# Patient Record
Sex: Female | Born: 1980 | Race: Black or African American | Hispanic: No | Smoking: Former smoker
Health system: Southern US, Community
[De-identification: ages and names within clinical notes are randomized; demographics above are authoritative.]

## PROBLEM LIST (undated history)

## (undated) DIAGNOSIS — D259 Leiomyoma of uterus, unspecified: Secondary | ICD-10-CM

## (undated) DIAGNOSIS — E669 Obesity, unspecified: Secondary | ICD-10-CM

## (undated) DIAGNOSIS — N183 Chronic kidney disease, stage 3 unspecified: Secondary | ICD-10-CM

## (undated) DIAGNOSIS — E875 Hyperkalemia: Secondary | ICD-10-CM

## (undated) DIAGNOSIS — K760 Fatty (change of) liver, not elsewhere classified: Secondary | ICD-10-CM

## (undated) DIAGNOSIS — I5032 Chronic diastolic (congestive) heart failure: Secondary | ICD-10-CM

## (undated) DIAGNOSIS — K219 Gastro-esophageal reflux disease without esophagitis: Secondary | ICD-10-CM

## (undated) DIAGNOSIS — I2510 Atherosclerotic heart disease of native coronary artery without angina pectoris: Secondary | ICD-10-CM

## (undated) DIAGNOSIS — E785 Hyperlipidemia, unspecified: Secondary | ICD-10-CM

## (undated) DIAGNOSIS — D509 Iron deficiency anemia, unspecified: Secondary | ICD-10-CM

## (undated) DIAGNOSIS — E1143 Type 2 diabetes mellitus with diabetic autonomic (poly)neuropathy: Secondary | ICD-10-CM

## (undated) DIAGNOSIS — I10 Essential (primary) hypertension: Secondary | ICD-10-CM

## (undated) DIAGNOSIS — Z8669 Personal history of other diseases of the nervous system and sense organs: Secondary | ICD-10-CM

## (undated) DIAGNOSIS — Z720 Tobacco use: Secondary | ICD-10-CM

## (undated) DIAGNOSIS — I255 Ischemic cardiomyopathy: Secondary | ICD-10-CM

## (undated) DIAGNOSIS — N184 Chronic kidney disease, stage 4 (severe): Secondary | ICD-10-CM

## (undated) DIAGNOSIS — K5730 Diverticulosis of large intestine without perforation or abscess without bleeding: Secondary | ICD-10-CM

## (undated) DIAGNOSIS — E559 Vitamin D deficiency, unspecified: Secondary | ICD-10-CM

## (undated) DIAGNOSIS — N920 Excessive and frequent menstruation with regular cycle: Secondary | ICD-10-CM

## (undated) DIAGNOSIS — E11319 Type 2 diabetes mellitus with unspecified diabetic retinopathy without macular edema: Secondary | ICD-10-CM

## (undated) DIAGNOSIS — I509 Heart failure, unspecified: Secondary | ICD-10-CM

## (undated) DIAGNOSIS — R42 Dizziness and giddiness: Secondary | ICD-10-CM

## (undated) DIAGNOSIS — E119 Type 2 diabetes mellitus without complications: Secondary | ICD-10-CM

## (undated) DIAGNOSIS — K3184 Gastroparesis: Secondary | ICD-10-CM

## (undated) DIAGNOSIS — G8929 Other chronic pain: Secondary | ICD-10-CM

## (undated) HISTORY — DX: Type 2 diabetes mellitus without complications: E11.9

## (undated) HISTORY — DX: Leiomyoma of uterus, unspecified: D25.9

## (undated) HISTORY — DX: Chronic diastolic (congestive) heart failure: I50.32

## (undated) HISTORY — DX: Chronic kidney disease, stage 4 (severe): N18.4

## (undated) HISTORY — DX: Diverticulosis of large intestine without perforation or abscess without bleeding: K57.30

## (undated) HISTORY — DX: Heart failure, unspecified: I50.9

## (undated) HISTORY — DX: Obesity, unspecified: E66.9

## (undated) HISTORY — DX: Type 2 diabetes mellitus with diabetic autonomic (poly)neuropathy: K31.84

## (undated) HISTORY — DX: Dizziness and giddiness: R42

## (undated) HISTORY — DX: Vitamin D deficiency, unspecified: E55.9

## (undated) HISTORY — DX: Tobacco use: Z72.0

## (undated) HISTORY — DX: Type 2 diabetes mellitus with diabetic autonomic (poly)neuropathy: E11.43

## (undated) HISTORY — DX: Hyperlipidemia, unspecified: E78.5

## (undated) HISTORY — DX: Chronic kidney disease, stage 3 (moderate): N18.3

## (undated) HISTORY — DX: Atherosclerotic heart disease of native coronary artery without angina pectoris: I25.10

## (undated) HISTORY — DX: Hyperkalemia: E87.5

## (undated) HISTORY — DX: Chronic kidney disease, stage 3 unspecified: N18.30

## (undated) HISTORY — PX: APPENDECTOMY: SHX54

## (undated) HISTORY — DX: Other chronic pain: G89.29

## (undated) HISTORY — PX: MOUTH SURGERY: SHX715

## (undated) HISTORY — DX: Personal history of other diseases of the nervous system and sense organs: Z86.69

## (undated) HISTORY — DX: Ischemic cardiomyopathy: I25.5

---

## 2015-01-26 ENCOUNTER — Emergency Department: Payer: Self-pay | Admitting: Internal Medicine

## 2015-03-10 HISTORY — PX: CARDIAC CATHETERIZATION: SHX172

## 2015-03-30 ENCOUNTER — Encounter: Payer: Self-pay | Admitting: Physician Assistant

## 2015-03-30 ENCOUNTER — Inpatient Hospital Stay
Admit: 2015-03-30 | Disposition: A | Discharge status: Home / Self Care | Payer: Self-pay | Attending: Internal Medicine | Admitting: Internal Medicine

## 2015-03-30 DIAGNOSIS — I214 Non-ST elevation (NSTEMI) myocardial infarction: Secondary | ICD-10-CM

## 2015-03-30 DIAGNOSIS — E669 Obesity, unspecified: Secondary | ICD-10-CM | POA: Insufficient documentation

## 2015-03-30 DIAGNOSIS — I5021 Acute systolic (congestive) heart failure: Secondary | ICD-10-CM

## 2015-03-30 DIAGNOSIS — Z720 Tobacco use: Secondary | ICD-10-CM

## 2015-03-30 DIAGNOSIS — I340 Nonrheumatic mitral (valve) insufficiency: Secondary | ICD-10-CM

## 2015-03-30 DIAGNOSIS — I2510 Atherosclerotic heart disease of native coronary artery without angina pectoris: Secondary | ICD-10-CM

## 2015-03-30 DIAGNOSIS — I255 Ischemic cardiomyopathy: Secondary | ICD-10-CM | POA: Insufficient documentation

## 2015-03-30 LAB — URINALYSIS, COMPLETE
Bilirubin,UR: NEGATIVE
Glucose,UR: >=500 mg/dL (ref 0–75)
Ketone: NEGATIVE
Nitrite: NEGATIVE
Ph: 5 (ref 4.5–8.0)
Protein: 100
Specific Gravity: 1.023 (ref 1.003–1.030)

## 2015-03-30 LAB — CBC
HCT: 33.4 % — ABNORMAL LOW (ref 35.0–47.0)
HGB: 10.8 g/dL — ABNORMAL LOW (ref 12.0–16.0)
MCH: 25.6 pg — ABNORMAL LOW (ref 26.0–34.0)
MCHC: 32.5 g/dL (ref 32.0–36.0)
MCV: 79 fL — ABNORMAL LOW (ref 80–100)
Platelet: 428 10*3/uL (ref 150–440)
RBC: 4.23 10*6/uL (ref 3.80–5.20)
RDW: 14.2 % (ref 11.5–14.5)
WBC: 6.1 10*3/uL (ref 3.6–11.0)

## 2015-03-30 LAB — PROTIME-INR
INR: 1
Prothrombin Time: 13.5 secs

## 2015-03-30 LAB — HEPARIN LEVEL (UNFRACTIONATED): Anti-Xa(Unfractionated): 0.23 IU/mL — ABNORMAL LOW (ref 0.30–0.70)

## 2015-03-30 LAB — BASIC METABOLIC PANEL
Anion Gap: 8 (ref 7–16)
BUN: 12 mg/dL
Calcium, Total: 8.9 mg/dL
Chloride: 102 mmol/L
Co2: 24 mmol/L
Creatinine: 0.74 mg/dL
EGFR (African American): >60
EGFR (Non-African Amer.): >60
Glucose: 314 mg/dL — ABNORMAL HIGH
Potassium: 3.8 mmol/L
Sodium: 134 mmol/L — ABNORMAL LOW

## 2015-03-30 LAB — CK TOTAL AND CKMB (NOT AT ARMC)
CK, Total: 473 U/L — ABNORMAL HIGH
CK-MB: 25.4 ng/mL — ABNORMAL HIGH

## 2015-03-30 LAB — TROPONIN I
Troponin-I: 7.74 ng/mL — ABNORMAL HIGH
Troponin-I: 8.24 ng/mL — ABNORMAL HIGH
Troponin-I: 9.87 ng/mL — ABNORMAL HIGH

## 2015-03-30 LAB — APTT: Activated PTT: 29.9 secs (ref 23.6–35.9)

## 2015-03-30 LAB — RAPID INFLUENZA A&B ANTIGENS

## 2015-03-30 LAB — HCG, QUANTITATIVE, PREGNANCY: Beta Hcg, Quant.: <1 m[IU]/mL

## 2015-03-30 LAB — CK-MB
CK-MB: 31 ng/mL — ABNORMAL HIGH
CK-MB: 27.7 ng/mL — ABNORMAL HIGH

## 2015-03-31 LAB — LIPID PANEL
Cholesterol: 133 mg/dL
HDL Cholesterol: 26 mg/dL — ABNORMAL LOW
Ldl Cholesterol, Calc: 92 mg/dL
Triglycerides: 77 mg/dL
VLDL Cholesterol, Calc: 15 mg/dL

## 2015-03-31 LAB — BASIC METABOLIC PANEL
Anion Gap: 4 — ABNORMAL LOW (ref 7–16)
BUN: 13 mg/dL
Calcium, Total: 7.7 mg/dL — ABNORMAL LOW
Chloride: 105 mmol/L
Co2: 22 mmol/L
Creatinine: 0.87 mg/dL
EGFR (African American): >60
EGFR (Non-African Amer.): >60
Glucose: 373 mg/dL — ABNORMAL HIGH
Potassium: 3.6 mmol/L
Sodium: 131 mmol/L — ABNORMAL LOW

## 2015-03-31 LAB — HEMOGLOBIN A1C: Hemoglobin A1C: 14.5 % — ABNORMAL HIGH

## 2015-03-31 LAB — CK-MB: CK-MB: 31.5 ng/mL — ABNORMAL HIGH

## 2015-03-31 LAB — TSH: Thyroid Stimulating Horm: 1.076 u[IU]/mL

## 2015-04-02 LAB — URINE CULTURE

## 2015-04-03 ENCOUNTER — Encounter: Payer: Self-pay | Admitting: Cardiovascular Disease

## 2015-04-04 LAB — CULTURE, BLOOD (SINGLE)

## 2015-04-09 NOTE — Discharge Summary (Signed)
PATIENT NAME:  [name] [name], [name] [name] MR#:  [id number] DATE OF BIRTH:  30-Nov-1981  DATE OF ADMISSION:  03/30/2015 DATE OF DISCHARGE:  03/31/2015  ADMISSION DIAGNOSES:  1.  Non-ST elevation myocardial infarction.  2.  New onset congestive heart failure.   DISCHARGE DIAGNOSES:  1.  Non-ST elevation myocardial infarction.  2.  Ischemic cardiomyopathy, ejection fraction of 25%.  3.  Tobacco dependence.  4. UTI   CONSULTATIONS: Dr. Fletcher Anon.   PROCEDURES: The patient underwent a cardiac catheterization on 03/30/2015. She had a drug-eluting stent in a 100% lesion in the mid circumflex. She had another drug-eluting stent in a 90% lesion in the first obtuse marginal.   DIAGNOSTIC DATA: Cholesterol 133. LDL 92. TSH 1.076. Sodium 131, potassium 3.6, chloride 105, bicarb 22, BUN 13, creatinine 0.7, glucose 373.  ucx 80,000 GPC PHYSICAL EXAMINATION AT DISCHARGE: VITAL SIGNS: Temperature 99.4, pulse 95, respirations 17, blood pressure 105/71, 98% on room air.  GENERAL: The patient is alert and oriented, not in acute distress.  CARDIOVASCULAR: Tachycardia. No murmurs, gallops, or rubs. PMI was not displaced.  LUNGS: Clear to auscultation. No crackles, rales, rhonchi or wheezing.  ABDOMEN: Bowel sounds are positive. Nontender, nondistended. No hepatosplenomegaly.   HOSPITAL COURSE: This is a 34 year old female who presented with shortness of breath and chest pressure. Was found to have a non-ST-elevation MI. For further details, please refer to the H and P.  1.  Non-ST-elevation MI. The patient's troponins up to 9. She underwent a 2-D echocardiogram which showed an EF of 30% to 35% along with mild concentric LVH and severe hypokinesis of the inferior, inferolateral and lateral walls. She then underwent a cardiac catheterization which subsequently led to 2 stent placements, as mentioned above, in her mid circumflex lesion and ostial. She is now on Brilinta, Coreg, statin medication and aspirin. She will need close  follow-up with cardiology. She was also referred to cardiac rehab.  2.  Cardiomyopathy with acute systolic heart failure, which ischemic in nature. The patient was diuresed with Lasix. She is now saturating 100% on room air.  3.  Fever. This is likely reactive from her acute heart attack or a viral syndrome or UTI. She was placed on broad-spectrum antibiotics. Blood culture negative.  4.  Diet-controlled diabetes. The patient was referred to outpatient diabetes education.  5.  Tobacco dependence. The patient is encouraged highly to stop smoking. She was counseled for 3 minutes regarding this and will be discharged with a nicotine patch.  6. UTI with fevers on presentation she may have UTI AMOXICILLIN is prescribed blood cx are negative  DISCHARGE MEDICATIONS: 1.  Brilinta 90 mg q. 12 hours  2.  Coreg 3.125 b.i.d.  3.  Atorvastatin 40 mg daily.  4.  Aspirin 81 mg daily.  5.  Nicotine 7 mg/24 hours.  6.  Nitroglycerin sublingual for chest pain.  7. Amoxicillin 500 mg PO TID for 10 days  DISCHARGE DIET: Low sodium, ADA diet.   DISCHARGE ACTIVITY: No exertion or heavy lifting for 1 week.  DISCHARGE FOLLOWUP: The patient will follow up with Dr. Fletcher Anon in 1 week.  TIME SPENT: Approximately 35 minutes.  ____________________________ Donell Beers. Benjie Karvonen, MD spm:sb D: 03/31/2015 12:22:16 ET T: 03/31/2015 12:39:56 ET JOB#: [id number]  cc: Jazir Newey P. Benjie Karvonen, MD, <Dictator> Muhammad A. Fletcher Anon, MD Donell Beers Castin Donaghue MD ELECTRONICALLY SIGNED 03/31/2015 14:52

## 2015-04-09 NOTE — H&P (Signed)
PATIENT NAME:  [name] [name], PIKER MR#:  [id number] DATE OF BIRTH:  1981-11-05  DATE OF ADMISSION:  03/30/2015  REFERRING PHYSICIAN: Marjean Donna, M.D.   PRIMARY CARE PHYSICIAN: Nonlocal.   ADMISSION DIAGNOSIS: Myocarditis and sepsis.   HISTORY OF PRESENT ILLNESS: This is a 34 year old African American female who presents to the Emergency Department complaining of dyspnea on exertion as well as lightheadedness. The patient states that when she is in her usual state of health she has no exercise intolerance, but experienced the aforementioned symptoms just by walking to her car today. She states that once she reached her vehicle, she sat down and rested for awhile and felt better. By the time that she went to bed tonight, she felt some pressure and tightness in her chest that wrapped around her ribs that prompted her to come to the Emergency Department. She states that for 2 days she has had sore throat and headache with chills. She also notes that her feet have been swollen throughout the day for the last few days. In the Emergency Department, she was found to have urinalysis concerning for urinary tract infection, but she denies any urinary symptoms. She admits that she has been spotting some this week and that her period has been irregular. She knows that she has an IUD that needs to be removed, but has not undergone this procedure yet. Most concerning, however, is that laboratory evaluation in the Emergency Department revealed significantly elevated troponin, which prompted the Emergency Department to call for admission.   REVIEW OF SYSTEMS: CONSTITUTIONAL: The patient admits to subjective fever and chills as well as some generalized malaise.  EYES: Denies blurred vision or inflammation.  EARS, NOSE AND THROAT: Denies tinnitus but admits to sore throat.  RESPIRATORY: Denies cough or shortness of breath at rest.  CARDIOVASCULAR: Admits to chest pain as described above. She denies palpitations,  orthopnea, or paroxysmal nocturnal dyspnea.  GASTROINTESTINAL: Denies nausea, vomiting, diarrhea, or abdominal pain.  GENITOURINARY: Denies dysuria, increased frequency, or hesitancy of urination.  ENDOCRINE: Denies polyuria or polydipsia.  HEMATOLOGIC AND LYMPHATIC: Denies easy bruising or bleeding.  INTEGUMENTARY: Denies rashes or lesions.  MUSCULOSKELETAL: Denies arthralgias or myalgias.  NEUROLOGIC: Denies numbness in her extremities or dysarthria.  PSYCHIATRIC: Denies depression or suicidal ideation.   PAST MEDICAL HISTORY: Occasional headaches.   PAST SURGICAL HISTORY: IUD placement as well as oral surgery.   SOCIAL HISTORY: The patient has 1 child who is in good health. She has a 4-1/2 pack-year history of smoking. She denies alcohol or drug use.   FAMILY HISTORY: Diabetes is common on her father's side of the family. There are multiple forms of cancer on her mother's side of the family none of which are breast cancer or uterine cancer.   MEDICATIONS: The patient takes no home medications.   ALLERGIES: SHELLFISH.   PERTINENT LABORATORY RESULTS AND RADIOGRAPHIC FINDINGS: Serum glucose 314, BUN 12, creatinine 0.74, serum sodium 134, potassium 3.8, chloride 102, bicarb 24. Troponin 8.24. White blood cell count 6.1, hemoglobin 10.8, hematocrit 33.4, platelet count 428,000, MCV 79. INR 1.   Urinalysis shows 3+ blood, negative nitrites, 3+ leukocyte esterase, red blood cells too numerous to count and white blood cells too numerous to count, rare bacteria, and there is some budding yeast.   Chest x-ray shows pulmonary edema pattern with interstitial edema and small amount of fluid in the fissures. The heart is a normal size. There are no focal pulmonary findings.   PHYSICAL EXAMINATION: VITAL SIGNS: Temperature 99.6, pulse  119, respirations 18, blood pressure 134/86, pulse oximetry 100% on room air.  GENERAL: The patient is alert and oriented x3, in no apparent distress.  HEENT:  Normocephalic, atraumatic. Pupils equal, round, and reactive to light and accommodation. Extraocular movements are intact. Mucous membranes are moist.  NECK: Trachea is midline. No adenopathy. Thyroid is nonpalpable and nontender.  CHEST: Symmetric and atraumatic.  CARDIOVASCULAR: Regular rate and rhythm. Normal S1, S2. No rubs, clicks, or murmurs appreciated.  LUNGS: Clear to auscultation bilaterally. Normal effort and excursion.  ABDOMEN: Positive bowel sounds. Soft, nontender, nondistended. No hepatosplenomegaly.  GENITOURINARY: Deferred.  MUSCULOSKELETAL: The patient moves all 4 extremities equally. There is 5/5 strength in upper and lower extremities bilaterally.  SKIN: Warm and dry. There are no rashes or lesions.  EXTREMITIES: No clubbing or cyanosis, although the patient does have some trace ankle edema.  NEUROLOGIC: Cranial nerves II through XII are grossly intact.  PSYCHIATRIC: Mood is normal. Affect is congruent. The patient has excellent judgment and insight into her medical condition.   ASSESSMENT AND PLAN: This is a 34 year old female admitted for myocarditis and sepsis.  1.  Myocarditis: The patient has had a viral prodrome followed by dyspnea on exertion and chest tightness at rest. She has a dramatically elevated troponin, but no EKG changes at this time. She has no cardiomegaly, but there is some pulmonary edema all of which form a clinical picture of possible myocarditis. I have started a heparin drip and ordered intravenous immunoglobulin. I have also ordered laboratory evaluation for flu and enterovirus. I have placed a cardiology consult and ordered an echocardiogram.  2.  Sepsis: The patient meets criteria via tachycardia and tachypnea. I have obtained blood cultures and urine cultures as well as started vancomycin and Zosyn for broad antibiotic coverage. Notably, the patient's urinalysis seems positive for infection, but the patient is asymptomatic. She admits to spotting  earlier this week and irregular periods, which could certainly produce enough blood and white blood cells to give a similar picture.  3.  Obesity: The patient's BMI is 30.7. I have encouraged a healthy diet and exercise.  4.  Deep vein thrombosis prophylaxis: The patient is on treatment doses of heparin.  5.  Gastrointestinal prophylaxis: None as the patient is not critically ill at this time.   CODE STATUS: The patient is a FULL code.   TIME SPENT ON ADMISSION ORDERS AND CRITICAL PATIENT CARE: Approximately 45 minutes. ____________________________ Norva Riffle. Marcille Blanco, MD msd:sb D: 03/30/2015 06:23:04 ET T: 03/30/2015 07:10:50 ET JOB#: [id number]  cc: Norva Riffle. Marcille Blanco, MD, <Dictator> Norva Riffle Christropher Gintz MD ELECTRONICALLY SIGNED 04/02/2015 3:29

## 2015-04-09 NOTE — Consult Note (Signed)
PATIENT NAME:  [name] [name], [name] [name] MR#:  [id number] DATE OF BIRTH:  [birth year]  DATE OF CONSULTATION:  03/30/2015  REFERRING PHYSICIAN:   CONSULTING PHYSICIAN:  Muhammad A. Fletcher Anon, MD  REQUESTING:   Rosilyn Mings, MD  REASON FOR CONSULTATION:  Elevated troponin.   HISTORY OF PRESENT ILLNESS: This is a 34 year old African American female with no previous cardiac history. She has prolonged history of type 2 diabetes since 2002 which she reports have been bad control, although she has not been following up with a primary care physician, prolonged history of tobacco use, and obesity. The patient presented to the Emergency Room with shortness of breath, and substernal chest tightness and lightheadedness. She started feeling unusual about 2 days ago with a headache, fever, and possible cold. Last night her symptoms worsened significantly with severe shortness of breath and chest tightness. Upon presentation to the Emergency Room, she was noted to have an elevated troponin. EKG showed no acute ST or T wave changes. She was noted to have sinus tachycardia with evidence of pulmonary edema on chest x-ray. She was hypoxic, but improved with diuresis. She is currently feeling better, but is still tachycardic. She was started on IVIg for presumed myocarditis. She is having a fever today, but her white cell count is normal and blood cultures have been negative.   HOME MEDICATIONS:  Include ibuprofen as needed.   ALLERGIES: INCLUDE SHELLFISH.   FAMILY HISTORY:  Is remarkable for diabetes and cancer.   SOCIAL HISTORY: She smokes 1 pack per day. She denies any alcohol or recreational drug use.   REVIEW OF SYSTEMS: A 10 point review of systems was performed. It is negative other than what is mentioned in the HPI.   PHYSICAL EXAMINATION: GENERAL: The patient appears to be older than her stated age, but in no acute distress.  VITAL SIGNS: Temperature is 99.6, pulse is 123, respiratory rate is 22, blood pressure is  126/80, and oxygen saturation is 94% on 3 liters nasal cannula. She was hypoxic on presentation at 78%.  HEENT: Normocephalic, atraumatic.  NECK: No JVD or carotid bruits.  RESPIRATORY: Normal respiratory effort with no use of accessory muscles. Auscultation reveals normal breath sounds.  CARDIOVASCULAR: Normal PMI. Normal S1 and S2 with no gallops or murmurs.  ABDOMEN: Is benign, nontender, and nondistended.  EXTREMITIES: With no clubbing, cyanosis, or edema.  SKIN: Is warm and dry with no rash.  PSYCHIATRIC: She is alert, oriented x3 with normal mood and affect.   LABORATORY AND DIAGNOSTIC DATA: Renal function is normal. Her glucose was elevated at 314. Initial troponin was 8.24 which increased to 9.87 and then decreased back to 7.74. White cell count is normal. She is mildly anemic with a hemoglobin of 10.9. Blood culture is negative. Urinalysis was positive for infection. ECG showed sinus tachycardia with nonspecific ST and T wave changes. Chest x-ray showed pulmonary edema.   IMPRESSION: 1. Non-ST elevation myocardial infarction.  2. Acute systolic heart failure.  3. Type 2 diabetes.  4. Tobacco use.   RECOMMENDATIONS: The patient's presentation is somewhat unusual. However, she has risk factors for coronary artery disease that include type 2 diabetes and tobacco use. Echocardiogram showed an ejection fraction of 30-35% with severe hypokinesis of the inferior, inferolateral, and  lateral walls with moderate mitral regurgitation which appears to be ischemic. She is having symptoms of upper respiratory tract infection and myocarditis is certainly a possibility. However, this is a diagnosis of exclusion in this situation given that the more likely diagnosis  is underlying obstructive coronary artery disease. Due to that, I recommend proceeding with urgent cardiac catheterization and possible coronary intervention. I discussed the procedure in detail as well as risks and benefits. In the meanwhile,  continue anticoagulation with heparin. Continue aspirin, small dose carvedilol, and lisinopril. Further recommendations to follow after cardiac catheterization.    ____________________________ Mertie Clause Fletcher Anon, MD maa:tr D: 03/30/2015 11:31:26 ET T: 03/30/2015 12:20:36 ET JOB#: [id number]  cc: Muhammad A. Fletcher Anon, MD, <Dictator> Wellington Hampshire MD ELECTRONICALLY SIGNED 04/04/2015 18:36

## 2015-04-09 NOTE — Consult Note (Signed)
Brief Consult Note: Diagnosis: NSTEMI.   Patient was seen by consultant.   Consult note dictated.   Recommend to proceed with surgery or procedure.   Comments: Myocarditis is a possiblility but that is a diagnosis of exclusion.  Risk factors include diabetes and smoking. echo showed significant wall motion abnormalities in LCX distribution with moderate MR.  Continue Heparin.  Cardiac cath today.  Electronic Signatures: Kathlyn Sacramento (MD)  (Signed 21-Apr-16 11:21)  Authored: Brief Consult Note   Last Updated: 21-Apr-16 11:21 by Kathlyn Sacramento (MD)

## 2015-04-13 ENCOUNTER — Ambulatory Visit (INDEPENDENT_AMBULATORY_CARE_PROVIDER_SITE_OTHER): Payer: Self-pay | Admitting: Cardiovascular Disease

## 2015-04-13 ENCOUNTER — Encounter: Payer: Self-pay | Admitting: Cardiovascular Disease

## 2015-04-13 VITALS — BP 134/80 | HR 97 | Ht 64.0 in | Wt 179.5 lb

## 2015-04-13 DIAGNOSIS — I5022 Chronic systolic (congestive) heart failure: Secondary | ICD-10-CM

## 2015-04-13 DIAGNOSIS — E118 Type 2 diabetes mellitus with unspecified complications: Secondary | ICD-10-CM

## 2015-04-13 DIAGNOSIS — I2510 Atherosclerotic heart disease of native coronary artery without angina pectoris: Secondary | ICD-10-CM

## 2015-04-13 DIAGNOSIS — E1169 Type 2 diabetes mellitus with other specified complication: Secondary | ICD-10-CM | POA: Insufficient documentation

## 2015-04-13 DIAGNOSIS — R0602 Shortness of breath: Secondary | ICD-10-CM

## 2015-04-13 DIAGNOSIS — R079 Chest pain, unspecified: Secondary | ICD-10-CM

## 2015-04-13 DIAGNOSIS — E785 Hyperlipidemia, unspecified: Secondary | ICD-10-CM

## 2015-04-13 MED ORDER — LISINOPRIL 5 MG PO TABS: 5.0000 mg | ORAL_TABLET | Freq: Every day | ORAL | Status: DC

## 2015-04-13 NOTE — Assessment & Plan Note (Signed)
She appears to be euvolemic without diuretics. I added lisinopril 5 mg once daily. Continue small dose carvedilol.

## 2015-04-13 NOTE — Patient Instructions (Signed)
Start Lisinopril 5 mg once daily.  Continue other medications.   Refer to [facility] primary care as a new patient.   Follow up in 2 months (come fasting so we do labs).   You can resume work on 05/01/2015 without restriction.

## 2015-04-13 NOTE — Assessment & Plan Note (Signed)
I referred her to [facility] primary care to establish.

## 2015-04-13 NOTE — Progress Notes (Signed)
HPI  This is a 34 year old African American female who is here today for a follow-up visit regarding coronary artery disease and chronic systolic heart failure.  She has prolonged history of type 2 diabetes since 2002 , prolonged history of tobacco use, and obesity.  She presented recently to [facility] with shortness of breath, and substernal chest tightness and lightheadedness. She started feeling unusual about 2 days ago with a headache, fever, and possible cold. She was noted to have mildly elevated troponin. EKG showed no acute ST or T wave changes. She was noted to have sinus tachycardia with evidence of pulmonary edema on chest x-ray. She was hypoxic, but improved with diuresis.  Echocardiogram showed an ejection fraction of 30% with severe inferior, inferolateral and lateral wall hypokinesis with moderate mitral regurgitation. I proceeded with cardiac catheterization via the right radial artery which showed occluded mid left circumflex with significant disease affecting the proximal OM1. There was mild disease affecting the LAD and minor irregularities in the RCA. Ejection fraction was 35%. I performed successful angioplasty and drug-eluting stent placement to the mid LAD and proximal OM without complications. She was not discharged on an ACE inhibitor due to her relatively low blood pressure. She has no health insurance but she was referred to the medication management program at [facility] to assist with medications. Overall she has been doing reasonably well and denies any recurrent chest tightness. She complains of being tired and sleepy all the time. She quit smoking since her cardiac event.    Allergies  Allergen Reactions  . Shellfish Allergy      No current outpatient prescriptions on file prior to visit.   No current facility-administered medications on file prior to visit.     Past Medical History  Diagnosis Date  . CAD (coronary artery disease)     a. NSTEMI 03/2015: cath LM nl, pLAD  tubular 30%, mLCx 100% s/p PCI/DES, OM1 90% s/p PCI/DES, RCA minor irregs, EF 35%;   Marland Kitchen Chronic systolic CHF (congestive heart failure)     a. echo 03/2015: EF 30-35%, mild concentric LVH, severe HK of inf, inflat, & lat walls, mod MR, mild TR  . Ischemic cardiomyopathy   . DM2 (diabetes mellitus, type 2)     a. since 2002  . Tobacco abuse   . Obesity   . MI (myocardial infarction)   . Hyperlipidemia      Past Surgical History  Procedure Laterality Date  . Mouth surgery       Family History  Problem Relation Age of Onset  . Family history unknown: Yes     History   Social History  . Marital Status: Unknown    Spouse Name: N/A  . Number of Children: N/A  . Years of Education: N/A   Occupational History  . Not on file.   Social History Main Topics  . Smoking status: Former Research scientist (life sciences)  . Smokeless tobacco: Not on file  . Alcohol Use: No  . Drug Use: No  . Sexual Activity: Not on file   Other Topics Concern  . Not on file   Social History Narrative       PHYSICAL EXAM   BP 134/80 mmHg  Pulse 97  Ht 5\' 4"  (1.626 m)  Wt 179 lb 8 oz (81.421 kg)  BMI 30.80 kg/m2  Constitutional: She is oriented to person, place, and time. She appears well-developed and well-nourished. No distress.  HENT: No nasal discharge.  Head: Normocephalic and atraumatic.  Eyes: Pupils are  equal and round. No discharge.  Neck: Normal range of motion. Neck supple. No JVD present. No thyromegaly present.  Cardiovascular: Normal rate, regular rhythm, normal heart sounds. Exam reveals no gallop and no friction rub. No murmur heard.  Pulmonary/Chest: Effort normal and breath sounds normal. No stridor. No respiratory distress. She has no wheezes. She has no rales. She exhibits no tenderness.  Abdominal: Soft. Bowel sounds are normal. She exhibits no distension. There is no tenderness. There is no rebound and no guarding.  Musculoskeletal: Normal range of motion. She exhibits no edema and no  tenderness.  Neurological: She is alert and oriented to person, place, and time. Coordination normal.  Skin: Skin is warm and dry. No rash noted. She is not diaphoretic. No erythema. No pallor.  Psychiatric: She has a normal mood and affect. Her behavior is normal. Judgment and thought content normal.  Right radial pulse is normal with no hematoma.  [id number]  Rhythm  Diffuse low voltage.   -  Lateral T wave changes suggestive of ischemia.  ABNORMAL    ASSESSMENT AND PLAN

## 2015-04-13 NOTE — Assessment & Plan Note (Signed)
Continue treatment with atorvastatin. Check fasting lipid profile in 2 months.

## 2015-04-13 NOTE — Assessment & Plan Note (Addendum)
She is doing reasonably well with no recurrent angina. I recommend continuing dual antiplatelet therapy for at least one year. I discussed with her the importance of lifestyle changes. She can resume work on May 23 without restrictions. She works at [employer].

## 2015-04-15 ENCOUNTER — Telehealth: Payer: Self-pay | Admitting: Physician Assistant

## 2015-04-15 ENCOUNTER — Other Ambulatory Visit: Payer: Self-pay | Admitting: Physician Assistant

## 2015-04-15 DIAGNOSIS — I5022 Chronic systolic (congestive) heart failure: Secondary | ICD-10-CM

## 2015-04-15 DIAGNOSIS — I255 Ischemic cardiomyopathy: Secondary | ICD-10-CM

## 2015-04-15 MED ORDER — LOSARTAN POTASSIUM 25 MG PO TABS: 25.0000 mg | ORAL_TABLET | Freq: Every day | ORAL | Status: DC

## 2015-04-15 NOTE — Telephone Encounter (Signed)
Patient called because she had an allergic reaction to lisinopril last p.m., with itching, throat swelling and feeling that her tongue was enlarging and getting raw. She was treated at the emergency room and released. She understands clearly not to take the lisinopril again. She wanted to know what she was to take instead.  I sent in a prescription for losartan 25 mg daily to the [facility] nearby where she is staying with her mother in [place]. She will pick this up and start taking, letting us know how she is doing.

## 2015-04-19 ENCOUNTER — Telehealth: Payer: Self-pay | Admitting: *Deleted

## 2015-04-19 NOTE — Telephone Encounter (Signed)
Letter faxed.

## 2015-04-19 NOTE — Telephone Encounter (Signed)
Pt is asking if we can resend the letter for her job, they said they had not received it.  Please fax to: Garth Bigness  [phone]

## 2015-04-25 ENCOUNTER — Telehealth: Payer: Self-pay | Admitting: *Deleted

## 2015-04-25 NOTE — Telephone Encounter (Signed)
Pt c/o medication issue:  1. Name of Medication: A. Brilliant B. Asprin C. Carvedilol D. Losartan  E. Atorvastatin   2. How are you currently taking this medication (dosage and times per day)? Still taking them.   3. Are you having a reaction (difficulty breathing--STAT)? No, just dizziness.  4. What is your medication issue? Not sure which medication but nausea vomiting dizziness, blurred vision.   Dizziness goes and comes, blurred vision only when sun is out. Went online and saw that theses could be from all the medication but just wants to ask what can be done about this please advise.

## 2015-04-25 NOTE — Telephone Encounter (Signed)
Notified pt of Dr. Tyrell Antonio instructions. Pt verbalized understanding and stated she would get BP cuff, monitor pressures twice daily and call us 3-4 days with readings.

## 2015-04-25 NOTE — Telephone Encounter (Signed)
Dizziness as expected with carvedilol and losartan and usually gets better gradually with time. If possible she should record her blood pressure over the next few days and call us with readings.

## 2015-04-25 NOTE — Telephone Encounter (Signed)
S/w pt who expressed for past couple of day she has been dizzy when standing.  Stated lisinopril 04/13/15 but was advised to d/c after allergic reaction (swelling) on 5/7. Losartan added at that time. She states she has been having blurry vision, diarrhea and occasional vomiting since 5/7 and is concerned it is a reaction to one or a combination of the meds.  Does not take BP at home.  Advised pt to purchase BP cuff and monitor BP. Instructed pt how to obtain orthostatic VS and report the reading to the office.   Forwarded to Dr. Fletcher Anon for review. Please advise.

## 2015-04-26 ENCOUNTER — Ambulatory Visit: Payer: Self-pay

## 2015-04-27 ENCOUNTER — Other Ambulatory Visit: Payer: Self-pay

## 2015-04-27 ENCOUNTER — Telehealth: Payer: Self-pay

## 2015-04-27 ENCOUNTER — Emergency Department
Admission: EM | Admit: 2015-04-27 | Discharge: 2015-04-27 | Disposition: A | Discharge status: Home / Self Care | Payer: Medicaid Other | Attending: Emergency Medicine | Admitting: Emergency Medicine

## 2015-04-27 ENCOUNTER — Emergency Department: Payer: Medicaid Other

## 2015-04-27 ENCOUNTER — Encounter: Payer: Self-pay | Admitting: Emergency Medicine

## 2015-04-27 DIAGNOSIS — Z7982 Long term (current) use of aspirin: Secondary | ICD-10-CM | POA: Insufficient documentation

## 2015-04-27 DIAGNOSIS — R42 Dizziness and giddiness: Secondary | ICD-10-CM | POA: Diagnosis present

## 2015-04-27 DIAGNOSIS — Z87891 Personal history of nicotine dependence: Secondary | ICD-10-CM | POA: Diagnosis not present

## 2015-04-27 DIAGNOSIS — Z79899 Other long term (current) drug therapy: Secondary | ICD-10-CM | POA: Insufficient documentation

## 2015-04-27 DIAGNOSIS — R739 Hyperglycemia, unspecified: Secondary | ICD-10-CM

## 2015-04-27 DIAGNOSIS — E1165 Type 2 diabetes mellitus with hyperglycemia: Secondary | ICD-10-CM | POA: Diagnosis not present

## 2015-04-27 LAB — BASIC METABOLIC PANEL
Anion gap: 9 (ref 5–15)
BUN: 14 mg/dL (ref 6–20)
CO2: 22 mmol/L (ref 22–32)
Calcium: 8.9 mg/dL (ref 8.9–10.3)
Chloride: 100 mmol/L — ABNORMAL LOW (ref 101–111)
Creatinine, Ser: 0.92 mg/dL (ref 0.44–1.00)
GFR calc Af Amer: >60 mL/min (ref >60)
GFR calc non Af Amer: >60 mL/min (ref >60)
Glucose, Bld: 531 mg/dL (ref 65–99)
Potassium: 4.1 mmol/L (ref 3.5–5.1)
Sodium: 131 mmol/L — ABNORMAL LOW (ref 135–145)

## 2015-04-27 LAB — GLUCOSE, CAPILLARY
Glucose-Capillary: 436 mg/dL — ABNORMAL HIGH (ref 65–99)
Glucose-Capillary: 320 mg/dL — ABNORMAL HIGH (ref 65–99)

## 2015-04-27 LAB — CBC
HCT: 35.1 % (ref 35.0–47.0)
Hemoglobin: 11.1 g/dL — ABNORMAL LOW (ref 12.0–16.0)
MCH: 24.5 pg — ABNORMAL LOW (ref 26.0–34.0)
MCHC: 31.5 g/dL — ABNORMAL LOW (ref 32.0–36.0)
MCV: 77.6 fL — ABNORMAL LOW (ref 80.0–100.0)
Platelets: 393 10*3/uL (ref 150–440)
RBC: 4.53 MIL/uL (ref 3.80–5.20)
RDW: 15.7 % — ABNORMAL HIGH (ref 11.5–14.5)
WBC: 6.6 10*3/uL (ref 3.6–11.0)

## 2015-04-27 LAB — TROPONIN I: Troponin I: <0.03 ng/mL (ref <0.031)

## 2015-04-27 MED ORDER — METFORMIN HCL 500 MG PO TABS
500.0000 mg | ORAL_TABLET | Freq: Two times a day (BID) | ORAL | Status: DC
Start: 2015-04-27 — End: 2015-05-16

## 2015-04-27 MED ORDER — INSULIN ASPART 100 UNIT/ML ~~LOC~~ SOLN
SUBCUTANEOUS | Status: AC
Start: 2015-04-27 — End: 2015-04-27
  Administered 2015-04-27: 5 [IU] via INTRAVENOUS
  Filled 2015-04-27: qty 5

## 2015-04-27 MED ORDER — INSULIN ASPART 100 UNIT/ML ~~LOC~~ SOLN
5.0000 [IU] | Freq: Once | SUBCUTANEOUS | Status: AC
  Administered 2015-04-27: 5 [IU] via INTRAVENOUS

## 2015-04-27 MED ORDER — METFORMIN HCL 500 MG PO TABS
500.0000 mg | ORAL_TABLET | Freq: Once | ORAL | Status: AC
  Administered 2015-04-27: 500 mg via ORAL

## 2015-04-27 MED ORDER — SODIUM CHLORIDE 0.9 % IV BOLUS (SEPSIS)
1000.0000 mL | Freq: Once | INTRAVENOUS | Status: AC
  Administered 2015-04-27: 1000 mL via INTRAVENOUS

## 2015-04-27 MED ORDER — METFORMIN HCL 500 MG PO TABS
ORAL_TABLET | ORAL | Status: AC
  Administered 2015-04-27: 500 mg via ORAL
  Filled 2015-04-27: qty 1

## 2015-04-27 MED ORDER — INSULIN ASPART 100 UNIT/ML ~~LOC~~ SOLN
SUBCUTANEOUS | Status: AC
  Administered 2015-04-27: 5 [IU] via INTRAVENOUS
  Filled 2015-04-27: qty 5

## 2015-04-27 MED ORDER — SODIUM CHLORIDE 0.9 % IV BOLUS (SEPSIS): 2000.0000 mL | Freq: Once | INTRAVENOUS | Status: DC

## 2015-04-27 NOTE — Telephone Encounter (Signed)
These reading look ok.

## 2015-04-27 NOTE — Telephone Encounter (Signed)
Pt returned call and indicated she was at [facility] ED with sick daughter.. Pt feels light headed now and is having ER staff treat her as well.  Notified her of Dr. Tyrell Antonio response to BP readings. Indicated she would contact us with ER findings.

## 2015-04-27 NOTE — ED Notes (Signed)
Patient states she has been lightheaded and getting worse over last week. Was here on 03/30/15 for a heart attack. Has been given medication that she thinks may have added to it.

## 2015-04-27 NOTE — Telephone Encounter (Signed)
Pt has BP readings: 5/17-113/83 5/18-7:26 am 100/77; 7:05 pm 130/94 5/19- 7:28 am 98/73; 10:49 am 99/68 pt states she was feeling lightheaded and dizzy at this reading

## 2015-04-27 NOTE — Telephone Encounter (Signed)
Forward to Dr. Fletcher Anon for review of BP readings.  Currently taking coreg 3.125mg   BID Losartan 25mg  daily

## 2015-04-27 NOTE — ED Notes (Signed)
Pt reports that she had a MI in April of this year. She is on B/P medications and feels that they maybe making her dizzy. She has been taking her B/P and it has been in the 90's. Her MD told her to call them if it dropped, they have not called her back. Pt states that she has started to get Short of breath now also.

## 2015-04-27 NOTE — ED Provider Notes (Signed)
[facility] Emergency Department Provider Note  ____________________________________________  Time seen: Approximately [id number] PM  I have reviewed the triage vital signs and the nursing notes.   HISTORY  Chief Complaint Dizziness    HPI Joselynne Agins is a 34 y.o. female with a history of recent MI and diabetes which is diet controlled and presents with intermittent dizziness over the past week. She says that she is getting dizzy every time she stands up. She denies any chest pain however became short of breath while waiting in line with associated lightheadedness. Denied any palpitations or chest pain at this time. Patient reports compliance with her medicines that she was prescribed upon discharge. No symptoms while seated.   Past Medical History  Diagnosis Date  . CAD (coronary artery disease)     a. NSTEMI 03/2015: cath LM nl, pLAD tubular 30%, mLCx 100% s/p PCI/DES, OM1 90% s/p PCI/DES, RCA minor irregs, EF 35%;   Marland Kitchen Chronic systolic CHF (congestive heart failure)     a. echo 03/2015: EF 30-35%, mild concentric LVH, severe HK of inf, inflat, & lat walls, mod MR, mild TR  . Ischemic cardiomyopathy   . DM2 (diabetes mellitus, type 2)     a. since 2002  . Tobacco abuse   . Obesity   . MI (myocardial infarction)   . Hyperlipidemia     Patient Active Problem List   Diagnosis Date Noted  . Hyperlipidemia 04/13/2015  . CAD (coronary artery disease)   . Chronic systolic CHF (congestive heart failure)   . Ischemic cardiomyopathy   . DM2 (diabetes mellitus, type 2)   . Tobacco abuse   . Obesity     Past Surgical History  Procedure Laterality Date  . Mouth surgery    . Cardiac catheterization  4/16    x2 stent [facility]    Current Outpatient Rx  Name  Route  Sig  Dispense  Refill  . aspirin 81 MG tablet   Oral   Take 81 mg by mouth daily.         Marland Kitchen atorvastatin (LIPITOR) 40 MG tablet   Oral   Take 40 mg by mouth daily.         . carvedilol  (COREG) 3.125 MG tablet   Oral   Take 3.125 mg by mouth 2 (two) times daily with a meal.         . losartan (COZAAR) 25 MG tablet   Oral   Take 1 tablet (25 mg total) by mouth daily.   30 tablet   11   . nicotine (NICODERM CQ - DOSED IN MG/24 HR) 7 mg/24hr patch   Transdermal   Place 7 mg onto the skin daily.         . nitroGLYCERIN (NITRODUR - DOSED IN MG/24 HR) 0.4 mg/hr patch   Transdermal   Place 0.4 mg onto the skin daily.         . ticagrelor (BRILINTA) 90 MG TABS tablet   Oral   Take 90 mg by mouth 2 (two) times daily.           Allergies Lisinopril and Shellfish allergy  Family History  Problem Relation Age of Onset  . Family history unknown: Yes    Social History History  Substance Use Topics  . Smoking status: Former Research scientist (life sciences)  . Smokeless tobacco: Not on file  . Alcohol Use: No    Review of Systems Constitutional: No fever/chills Eyes: No visual changes. ENT: No sore throat.  Cardiovascular: Denies chest pain. Respiratory: Denies shortness of breath at this time. Gastrointestinal: No abdominal pain.  No nausea, no vomiting.  No diarrhea.  No constipation. Genitourinary: Negative for dysuria. Musculoskeletal: Negative for back pain. Skin: Negative for rash. Neurological: Negative for headaches, focal weakness or numbness.  10-point ROS otherwise negative.  ____________________________________________   PHYSICAL EXAM:  VITAL SIGNS: ED Triage Vitals  Enc Vitals Group     BP 04/27/15 1419 105/76 mmHg     Pulse Rate 04/27/15 1419 99     Resp 04/27/15 1419 18     Temp 04/27/15 1419 97.8 F (36.6 C)     Temp Source 04/27/15 1419 Oral     SpO2 04/27/15 1419 100 %     Weight 04/27/15 1419 169 lb (76.658 kg)     Height 04/27/15 1419 5\' 4"  (1.626 m)     Head Cir --      Peak Flow --      Pain Score 04/27/15 1419 0     Pain Loc --      Pain Edu? --      Excl. in [place]? --     Constitutional: Alert and oriented. Well appearing and in no  acute distress. Eyes: Conjunctivae are normal. PERRL. EOMI. Head: Atraumatic. Nose: No congestion/rhinnorhea. Mouth/Throat: Mucous membranes are moist.  Oropharynx non-erythematous. Neck: No stridor.   Cardiovascular: Normal rate, regular rhythm. Grossly normal heart sounds.  Good peripheral circulation. Respiratory: Normal respiratory effort.  No retractions. Lungs CTAB. Gastrointestinal: Soft and nontender. No distention. No abdominal bruits. No CVA tenderness. Musculoskeletal: No lower extremity tenderness nor edema.  No joint effusions. Neurologic:  Normal speech and language. No gross focal neurologic deficits are appreciated. Speech is normal. No gait instability. Skin:  Skin is warm, dry and intact. No rash noted. Psychiatric: Mood and affect are normal. Speech and behavior are normal.  ____________________________________________   LABS (all labs ordered are listed, but only abnormal results are displayed)  Labs Reviewed  CBC - Abnormal; Notable for the following:    Hemoglobin 11.1 (*)    MCV 77.6 (*)    MCH 24.5 (*)    MCHC 31.5 (*)    RDW 15.7 (*)    All other components within normal limits  BASIC METABOLIC PANEL - Abnormal; Notable for the following:    Sodium 131 (*)    Chloride 100 (*)    Glucose, Bld 531 (*)    All other components within normal limits  GLUCOSE, CAPILLARY - Abnormal; Notable for the following:    Glucose-Capillary 436 (*)    All other components within normal limits  TROPONIN I   ____________________________________________  EKG  ED ECG REPORT I, Doran Stabler, the attending physician, personally viewed and interpreted this ECG.   Date: 04/27/2015  EKG Time: 1437  Rate: 97  Rhythm: normal EKG, normal sinus rhythm, unchanged from previous tracings, normal sinus rhythm  Axis: Normal axis  Intervals:none  ST&T Change: T wave inversions in 23 aVF and aVL and V6. Old EKG reviewed in T-wave inversions only in aVL and 2 with flattening  in V6. Without chest pain or elevated troponin these changes likely represent changes post MI. Do not suspect acute process.   ____________________________________________  RADIOLOGY  Chest x-ray and NAD ____________________________________________   PROCEDU  ____________________________________________   INITIAL IMPRESSION / ASSESSMENT AND PLAN / ED COURSE  Pertinent labs & imaging results that were available during my care of the patient were reviewed by me and  considered in my medical decision making (see chart for details).  Patient with likely dehydration with elevated glucose. We'll rehydrate and give insulin. Consulted with Dr. Genia Harold to the patient's discharge instructions. Recommends metformin 500 mg twice a day and follow-up with diabetes education.  ----------------------------------------- 8:23 PM on 04/27/2015 -----------------------------------------  Patient resting calmly. Glucose down to 320. We'll discharge home with metformin twice a day. Consult ordered 2 diabetes nurse [place]. We'll give patient follow up with the [facility]. Patient says that symptoms are resolved after hydration and now that glucose is lowered. ____________________________________________   FINAL CLINICAL IMPRESSION(S) / ED DIAGNOSES  Hyperglycemia, diabetic. Acute, return visit.    Orbie Pyo, MD 04/27/15 2024

## 2015-04-27 NOTE — Telephone Encounter (Signed)
Left message on machine for patient to contact the office.   

## 2015-04-27 NOTE — Discharge Instructions (Signed)

## 2015-04-27 NOTE — ED Notes (Signed)
CRITICAL VALUE ALERT  Critical value received:  Blood Glucose 533  Date of notification:  04/27/15  Time of notification:  1508  Critical value read back:Yes.    Nurse who received alert:  Ernst Spell   MD notified (1st page):  Dr. Archie Balboa   Time of first page:  N/a  MD notified (2nd page): N/a  Time of second page: N/a  Responding MD:  Dr. Archie Balboa   Time MD responded:  [phone]

## 2015-04-28 NOTE — Telephone Encounter (Signed)
S/w pt who went to ER yesterday with sick daughter. Pt began to feel light headed and was treated at ER as well. In ER: BP stable BS in 500's. Treated and prescription given for metformin. Pt. Indicates she has not yet had it filled.  Encouraged pt to start taking metformin today and to continue monitoring BP.  Has follow up with Princella Ion in one week  Wants work note that states she is unable to return to work Monday. Will forward to Dr. Fletcher Anon for review

## 2015-04-28 NOTE — Telephone Encounter (Signed)
S/w pt who indicated she was feeling better but had not filled Metformin prescription. Educated pt on importance of medication compliance and to follow up with PCP to monitor DM. Pt had no further questions.

## 2015-04-28 NOTE — Telephone Encounter (Signed)
If her blood sugar goes down she should be able to go back to work on Monday. She should start taking metformin. This is really a blood sugar issue and she should address that with her primary care physician.

## 2015-04-28 NOTE — Telephone Encounter (Signed)
Pt updating [place], she is still light headed and now is concerned about having to go back work Monday.  Pt is asking for something so she is able to give to her job that she can't return to work.   Below is pt fax number: Fax: [phone]

## 2015-05-02 ENCOUNTER — Telehealth: Payer: Self-pay | Admitting: Cardiology

## 2015-05-02 NOTE — Telephone Encounter (Signed)
  Patient is s/p recent PCI and new diagnosis of systolic HF with EF of [id number]. Discharged on ASA, Brilinta, Coreg and lisinopril. Not on any diuretics. She called complaining of increased LEE that started today. No dyspnea. No chest pain. She had mild diaphoresis earlier. BP is stable at 125/88. HR is elevated at 111 (due for evening dose of Coreg). She reports full medication compliance and adherence to low sodium diet. I recommended that she call the office tomorrow for work-in at [place] office if available, if not may need to be seen in [place] in [facility]. Patient may have early stage of acute CHF. She was instructed to report to ER if any severe respiratory distress overnight. Patient voiced understanding and will call for an appointment in the am.   [name] [name] 05/02/2015 8:05 PM

## 2015-05-03 ENCOUNTER — Ambulatory Visit (INDEPENDENT_AMBULATORY_CARE_PROVIDER_SITE_OTHER): Payer: Self-pay | Admitting: Physician Assistant

## 2015-05-03 ENCOUNTER — Telehealth: Payer: Self-pay

## 2015-05-03 VITALS — BP 150/96 | HR 96 | Resp 18

## 2015-05-03 DIAGNOSIS — R0602 Shortness of breath: Secondary | ICD-10-CM

## 2015-05-03 DIAGNOSIS — I5022 Chronic systolic (congestive) heart failure: Secondary | ICD-10-CM

## 2015-05-03 DIAGNOSIS — R509 Fever, unspecified: Secondary | ICD-10-CM

## 2015-05-03 DIAGNOSIS — E118 Type 2 diabetes mellitus with unspecified complications: Secondary | ICD-10-CM

## 2015-05-03 DIAGNOSIS — I255 Ischemic cardiomyopathy: Secondary | ICD-10-CM

## 2015-05-03 DIAGNOSIS — E785 Hyperlipidemia, unspecified: Secondary | ICD-10-CM

## 2015-05-03 DIAGNOSIS — I2510 Atherosclerotic heart disease of native coronary artery without angina pectoris: Secondary | ICD-10-CM

## 2015-05-03 NOTE — Telephone Encounter (Signed)
Pt states she had to leave work early yesterday, states her HR was 111, this a.m. Was 102, states she was SOB, Swelling in her hands and feet. States she currently does not have any SOB, also lower back pain, and discomfort in arms.

## 2015-05-03 NOTE — Telephone Encounter (Signed)
S/w pt who indicated she could come in to office today at 3:00pm for a nurse visit

## 2015-05-03 NOTE — Progress Notes (Signed)
1.) Reason for visit: SOB, light headed  2.) Name of MD requesting visit:  Christell Faith, PA  3.) H&P: Pt s/p PCI and newly diagnosed CHF. Presented to ER 5/19 with elevated BS. Called office today after speaking with Lyda Jester PA-C on  5/24 who advised her to call  for a work-in.  Pt  c/o not feeling well, light-headed, LE tingling and SOB. Indicated she was sent home from work last night due to not feeling well.   4.) ROS related to problem: Patient presented for EKG and complained of SOB and light-headed. Indicated she had some LE swelling last night that has resolved.   5.) Assessment and plan per MD:  Continue same medications. Compliance with all meds including metformin, healthy lifestyle. Establish PCP.  Appointment made for pt with Dr. Brunetta Genera 5/27 at 2:15pm

## 2015-05-03 NOTE — Patient Instructions (Addendum)
Medication Instructions:  Please continue current meds  Labwork: None  Testing/Procedures: None  Follow-Up: Please follow up w/ Dr. Fletcher Anon in 3 months (pt has appt w/Arida July 7)  You have an appt to see Dr. Guy Begin at [facility],  [place], [place] Friday, May 27 at 2:15pm  $100 visit

## 2015-05-03 NOTE — Progress Notes (Signed)
Cardiology Office Note:  Date of Encounter: 05/03/2015  ID: [name], DOB 11-09-1981, MRN [id number]  PCP:  Kathlyn Sacramento, MD Primary Cardiologist:  Dr. Fletcher Anon, MD  Chief Complaint  Patient presents with  . other    EKG    HPI:  34 year old female who is here today for a follow up visit regarding coronary artery disease and chronic systolic heart failure. She has prolonged history of type 2 diabetes since 2002, prolonged history of tobacco use, and obesity.  She presented to [facility] in 03/2015 with SOB, substernal chest tightness, and lightheadedness. She was noted to have an elevated troponin to 9. EKG showed no acute ST or T wave changes. She was noted to have sinus tachycardia with evidence of pulmonary edema on chest x-ray. She was hypoxic, but improved with diuresis.  Echocardiogram showed an ejection fraction of 30% with severe inferior, inferolateral and lateral wall hypokinesis with moderate mitral regurgitation. She underwent cardiac catheterization via the right radial artery which showed occluded mid left circumflex with significant disease affecting the proximal OM1. There was mild disease affecting the LAD and minor irregularities in the RCA. Ejection fraction was 35%. She underwent successful angioplasty and drug-eluting stent placement to the mid LCx and proximal OM without complications. She was discharged on Coreg 3.125 mg bid, Brilinta 90 mg bid, Lipitor 40 mg daily, aspirin 81 mg daily, SL NTG, and nicotine patches. She has no health insurance but she was referred to the medication management program at [facility] to assist with medications.   In hospital follow up on 5/5, overall she had been doing reasonably well and denied any recurrent chest tightness. She complains of being tired and sleepy all the time. She quit smoking since her cardiac event. She was started on lisinopril 5 mg daily and advised she could resume work on 5/23. She was euvolemic at that time. After  starting the lisinopril she called on 5/7 reporting angioedema reaction and this was stopped. Losartan was called in, which she has been tolerating without issue. She called 5/17 with dizziness and was advised to pick up a BP cuff. She called back on 5/19 stating her BP was stable, though her blood sugar was in the 500's. She presented to [facility] ED for evaluation both herself and her daughter as she has had multiple family members with URI, cough and fever. While patient was at the ER for her daughter she felt dizzy so she decided to get checked in and was noted to have a blood sugar of greater than 500. Work up was negative. She was treated for her hyperglycemia and uncontrolled DM. She was made an appointment at the [facility] in 1 week.   Patient ultimately returned to work on 5/24. She had myalgias, fevers, chills, and dizziness and was sent home early. Subjective lower extremity and upper extremity edema. No cough. Positive globus sensation. She did not go to work today and requests an out of work note.      Past Medical History  Diagnosis Date  . CAD (coronary artery disease)     a. NSTEMI 03/2015: cath LM nl, pLAD tubular 30%, mLCx 100% s/p PCI/DES, OM1 90% s/p PCI/DES, RCA minor irregs, EF 35%;   Marland Kitchen Chronic systolic CHF (congestive heart failure)     a. echo 03/2015: EF 30-35%, mild concentric LVH, severe HK of inf, inflat, & lat walls, mod MR, mild TR  . Ischemic cardiomyopathy   . DM2 (diabetes mellitus, type 2)  a. since 2002  . Tobacco abuse   . Obesity   . MI (myocardial infarction)   . Hyperlipidemia   :  Past Surgical History  Procedure Laterality Date  . Mouth surgery    . Cardiac catheterization  4/16    x2 stent [facility]  :  Social History:  The patient  reports that she has quit smoking. She does not have any smokeless tobacco history on file. She reports that she does not drink alcohol or use illicit drugs.   Family History  Problem Relation Age of Onset  .  Family history unknown: Yes     Allergies:  Allergies  Allergen Reactions  . Lisinopril Swelling  . Shellfish Allergy      Home Medications:  Current Outpatient Prescriptions  Medication Sig Dispense Refill  . aspirin 81 MG tablet Take 81 mg by mouth daily.    Marland Kitchen atorvastatin (LIPITOR) 40 MG tablet Take 40 mg by mouth daily.    . carvedilol (COREG) 3.125 MG tablet Take 3.125 mg by mouth 2 (two) times daily with a meal.    . losartan (COZAAR) 25 MG tablet Take 1 tablet (25 mg total) by mouth daily. 30 tablet 11  . metFORMIN (GLUCOPHAGE) 500 MG tablet Take 1 tablet (500 mg total) by mouth 2 (two) times daily with a meal. 60 tablet 0  . nicotine (NICODERM CQ - DOSED IN MG/24 HR) 7 mg/24hr patch Place 7 mg onto the skin daily.    . nitroGLYCERIN (NITRODUR - DOSED IN MG/24 HR) 0.4 mg/hr patch Place 0.4 mg onto the skin daily.    . ticagrelor (BRILINTA) 90 MG TABS tablet Take 90 mg by mouth 2 (two) times daily.     No current facility-administered medications for this visit.     Review of Systems:  Review of Systems  Constitutional: Positive for fever, chills and malaise/fatigue. Negative for weight loss and diaphoresis.  HENT: Positive for congestion and sore throat. Negative for ear discharge, ear pain, hearing loss, nosebleeds and tinnitus.   Eyes: Negative for pain, discharge and redness.  Respiratory: Negative for cough, hemoptysis, sputum production, shortness of breath, wheezing and stridor.   Cardiovascular: Negative for chest pain, palpitations, orthopnea, claudication, leg swelling and PND.  Gastrointestinal: Negative for heartburn, nausea and vomiting.  Musculoskeletal: Positive for myalgias, back pain, joint pain and neck pain. Negative for falls.  Skin: Negative for itching and rash.  Neurological: Positive for dizziness, weakness and headaches. Negative for tingling, tremors and focal weakness.  Psychiatric/Behavioral: The patient is nervous/anxious.   All other  systems reviewed and are negative.    Physical Exam:  Blood pressure 150/96, pulse 96, resp. rate 18, last menstrual period 04/27/2015, SpO2 98 %. There is no weight on file to calculate BMI. General: Pleasant, NAD, laying comfortably on the exam table fully supine without any labored breathing. Psych: Normal affect. Neuro: Alert and oriented X 3. Moves all extremities spontaneously. HEENT: Normal.   Neck: Supple without bruits or JVD. Lungs:  Resp regular and unlabored, CTA bilaterally without wheezing, rales, or rhonchi.  Heart: RRR no s3, s4, or murmurs. Abdomen: Soft, non-tender, non-distended, BS + x 4.  Extremities: No clubbing or cyanosis.  There is no appreciable edema along any extremity. DP/PT/Radials 2+ and equal bilaterally.   Accessory Clinical Findings:  EKG - NSR, 94, nonspecific lateral st/t changes  Other studies Reviewed: Additional studies/ records that were reviewed today include: prior note.   Recent Labs: 04/27/2015: BUN 14; Creatinine 0.92; Hemoglobin  11.1*; Platelets 393; Potassium 4.1; Sodium 131*    Lipid Panel No results found for: CHOL, TRIG, HDL, CHOLHDL, VLDL, LDLCALC, LDLDIRECT   Weights: Wt Readings from Last 3 Encounters:  04/27/15 169 lb (76.658 kg)  04/13/15 179 lb 8 oz (81.421 kg)     Assessment & Plan:  1. CAD s/p PCI to mid LCx and prox OM1: -She is doing well from a cardiac standpoint, without angina or SOB -EKG without acute ischemic changes -Continue DAPT for at least 12 months, aspirin and Brilinta  -Continue Coreg 3.125 mg bid (I did not increase her dose today as her BP is usually on the soft side and it is likely transiently elevated 2/2 her fever) -Continue losartan and Lipitor    2. Dizziness:  -Most likely related to her poorly controlled diabetes  -She reports having had a blood sugar of 400 on 5/24 and 200 on the morning of 5/25 -She reports not having a PCP to date, though it is documented an appointment was made  for her at the [facility] on 5/19 -Our office called and made an appointment for her at a new PCP office until her Medicaid is approved  -She has also been referred to [facility] care by Dr. Fletcher Anon  3. DM2: -Uncontrolled, likely exacerbated by URI -Continue metformin per prescribing MD -PCP appointment was made prior to patient leaving office   4. Chronic systolic CHF/ICM: -She appears euvolemic on exam today -Lungs are clear, there is no edema on exam -Continue medications per #1 -Limit salt and fluid intake  5. Work: -She has been cleared to go back to work on 05/01/2015 and that remains the same today on 05/03/2015 -She asks several times about not going to work -There is no cardiac indication for her to not go to work  -Cleared to go to work at full duty  Dispo: -At some point she will have to take ownership in her own health care and make an appointment with a PCP to get her diabetes under control  -Follow up with Dr. Fletcher Anon in 3 months   Christell Faith, PA-C [name], [place] [address] [phone] [place] 05/03/2015, 4:12 PM

## 2015-05-05 ENCOUNTER — Emergency Department (HOSPITAL_COMMUNITY): Payer: Medicaid Other

## 2015-05-05 ENCOUNTER — Observation Stay (HOSPITAL_COMMUNITY)
Admission: EM | Admit: 2015-05-05 | Discharge: 2015-05-05 | Disposition: A | Discharge status: Home / Self Care | Payer: Medicaid Other | Attending: Internal Medicine | Admitting: Internal Medicine

## 2015-05-05 ENCOUNTER — Encounter (HOSPITAL_COMMUNITY)
Admission: EM | Disposition: A | Discharge status: Home / Self Care | Payer: Self-pay | Source: Home / Self Care | Attending: Emergency Medicine

## 2015-05-05 ENCOUNTER — Encounter (HOSPITAL_COMMUNITY): Payer: Self-pay | Admitting: Emergency Medicine

## 2015-05-05 DIAGNOSIS — Z955 Presence of coronary angioplasty implant and graft: Secondary | ICD-10-CM | POA: Diagnosis not present

## 2015-05-05 DIAGNOSIS — Z87891 Personal history of nicotine dependence: Secondary | ICD-10-CM | POA: Insufficient documentation

## 2015-05-05 DIAGNOSIS — E785 Hyperlipidemia, unspecified: Secondary | ICD-10-CM | POA: Diagnosis not present

## 2015-05-05 DIAGNOSIS — I255 Ischemic cardiomyopathy: Secondary | ICD-10-CM | POA: Insufficient documentation

## 2015-05-05 DIAGNOSIS — D649 Anemia, unspecified: Secondary | ICD-10-CM | POA: Insufficient documentation

## 2015-05-05 DIAGNOSIS — Z794 Long term (current) use of insulin: Secondary | ICD-10-CM | POA: Diagnosis not present

## 2015-05-05 DIAGNOSIS — R072 Precordial pain: Principal | ICD-10-CM

## 2015-05-05 DIAGNOSIS — E119 Type 2 diabetes mellitus without complications: Secondary | ICD-10-CM | POA: Insufficient documentation

## 2015-05-05 DIAGNOSIS — I252 Old myocardial infarction: Secondary | ICD-10-CM | POA: Diagnosis not present

## 2015-05-05 DIAGNOSIS — R079 Chest pain, unspecified: Secondary | ICD-10-CM

## 2015-05-05 DIAGNOSIS — E118 Type 2 diabetes mellitus with unspecified complications: Secondary | ICD-10-CM

## 2015-05-05 DIAGNOSIS — I5022 Chronic systolic (congestive) heart failure: Secondary | ICD-10-CM

## 2015-05-05 DIAGNOSIS — Z7982 Long term (current) use of aspirin: Secondary | ICD-10-CM | POA: Insufficient documentation

## 2015-05-05 DIAGNOSIS — I2510 Atherosclerotic heart disease of native coronary artery without angina pectoris: Secondary | ICD-10-CM

## 2015-05-05 DIAGNOSIS — Z23 Encounter for immunization: Secondary | ICD-10-CM | POA: Diagnosis not present

## 2015-05-05 HISTORY — PX: CARDIAC CATHETERIZATION: SHX172

## 2015-05-05 LAB — CBC WITH DIFFERENTIAL/PLATELET
Basophils Absolute: 0.1 10*3/uL (ref 0.0–0.1)
Basophils Absolute: 0.1 10*3/uL (ref 0.0–0.1)
Basophils Relative: 1 % (ref 0–1)
Basophils Relative: 1 % (ref 0–1)
Eosinophils Absolute: 0.3 10*3/uL (ref 0.0–0.7)
Eosinophils Absolute: 0.4 10*3/uL (ref 0.0–0.7)
Eosinophils Relative: 4 % (ref 0–5)
Eosinophils Relative: 4 % (ref 0–5)
HCT: 31.2 % — ABNORMAL LOW (ref 36.0–46.0)
HCT: 31.6 % — ABNORMAL LOW (ref 36.0–46.0)
Hemoglobin: 10.3 g/dL — ABNORMAL LOW (ref 12.0–15.0)
Hemoglobin: 10.5 g/dL — ABNORMAL LOW (ref 12.0–15.0)
Lymphocytes Relative: 39 % (ref 12–46)
Lymphocytes Relative: 31 % (ref 12–46)
Lymphs Abs: 2.8 10*3/uL (ref 0.7–4.0)
Lymphs Abs: 2.6 10*3/uL (ref 0.7–4.0)
MCH: 24.2 pg — ABNORMAL LOW (ref 26.0–34.0)
MCH: 24.5 pg — ABNORMAL LOW (ref 26.0–34.0)
MCHC: 33 g/dL (ref 30.0–36.0)
MCHC: 33.2 g/dL (ref 30.0–36.0)
MCV: 73.4 fL — ABNORMAL LOW (ref 78.0–100.0)
MCV: 73.8 fL — ABNORMAL LOW (ref 78.0–100.0)
Monocytes Absolute: 0.4 10*3/uL (ref 0.1–1.0)
Monocytes Absolute: 0.4 10*3/uL (ref 0.1–1.0)
Monocytes Relative: 6 % (ref 3–12)
Monocytes Relative: 4 % (ref 3–12)
Neutro Abs: 3.5 10*3/uL (ref 1.7–7.7)
Neutro Abs: 4.9 10*3/uL (ref 1.7–7.7)
Neutrophils Relative %: 50 % (ref 43–77)
Neutrophils Relative %: 60 % (ref 43–77)
Platelets: 406 10*3/uL — ABNORMAL HIGH (ref 150–400)
Platelets: 377 10*3/uL (ref 150–400)
RBC: 4.25 MIL/uL (ref 3.87–5.11)
RBC: 4.28 MIL/uL (ref 3.87–5.11)
RDW: 15 % (ref 11.5–15.5)
RDW: 15.1 % (ref 11.5–15.5)
WBC: 7.1 10*3/uL (ref 4.0–10.5)
WBC: 8.3 10*3/uL (ref 4.0–10.5)

## 2015-05-05 LAB — COMPREHENSIVE METABOLIC PANEL
ALT: 15 U/L (ref 14–54)
ALT: 16 U/L (ref 14–54)
AST: 20 U/L (ref 15–41)
AST: 21 U/L (ref 15–41)
Albumin: 3.1 g/dL — ABNORMAL LOW (ref 3.5–5.0)
Albumin: 3.1 g/dL — ABNORMAL LOW (ref 3.5–5.0)
Alkaline Phosphatase: 83 U/L (ref 38–126)
Alkaline Phosphatase: 80 U/L (ref 38–126)
Anion gap: 14 (ref 5–15)
Anion gap: 7 (ref 5–15)
BUN: 16 mg/dL (ref 6–20)
BUN: 14 mg/dL (ref 6–20)
CO2: 19 mmol/L — ABNORMAL LOW (ref 22–32)
CO2: 20 mmol/L — ABNORMAL LOW (ref 22–32)
Calcium: 8.6 mg/dL — ABNORMAL LOW (ref 8.9–10.3)
Calcium: 8.6 mg/dL — ABNORMAL LOW (ref 8.9–10.3)
Chloride: 102 mmol/L (ref 101–111)
Chloride: 108 mmol/L (ref 101–111)
Creatinine, Ser: 0.9 mg/dL (ref 0.44–1.00)
Creatinine, Ser: 0.67 mg/dL (ref 0.44–1.00)
GFR calc Af Amer: >60 mL/min (ref >60)
GFR calc Af Amer: >60 mL/min (ref >60)
GFR calc non Af Amer: >60 mL/min (ref >60)
GFR calc non Af Amer: >60 mL/min (ref >60)
Glucose, Bld: 177 mg/dL — ABNORMAL HIGH (ref 65–99)
Glucose, Bld: 176 mg/dL — ABNORMAL HIGH (ref 65–99)
Potassium: 4.2 mmol/L (ref 3.5–5.1)
Potassium: 3.9 mmol/L (ref 3.5–5.1)
Sodium: 135 mmol/L (ref 135–145)
Sodium: 135 mmol/L (ref 135–145)
Total Bilirubin: 0.5 mg/dL (ref 0.3–1.2)
Total Bilirubin: 0.6 mg/dL (ref 0.3–1.2)
Total Protein: 6.9 g/dL (ref 6.5–8.1)
Total Protein: 6.8 g/dL (ref 6.5–8.1)

## 2015-05-05 LAB — GLUCOSE, CAPILLARY
Glucose-Capillary: 165 mg/dL — ABNORMAL HIGH (ref 65–99)
Glucose-Capillary: 165 mg/dL — ABNORMAL HIGH (ref 65–99)
Glucose-Capillary: 303 mg/dL — ABNORMAL HIGH (ref 65–99)
Glucose-Capillary: 151 mg/dL — ABNORMAL HIGH (ref 65–99)

## 2015-05-05 LAB — LIPASE, BLOOD: Lipase: 23 U/L (ref 22–51)

## 2015-05-05 LAB — RAPID URINE DRUG SCREEN, HOSP PERFORMED
Amphetamines: NOT DETECTED
Barbiturates: NOT DETECTED
Benzodiazepines: NOT DETECTED
Cocaine: NOT DETECTED
Opiates: NOT DETECTED
Tetrahydrocannabinol: NOT DETECTED

## 2015-05-05 LAB — TROPONIN I
Troponin I: <0.03 ng/mL (ref <0.031)
Troponin I: <0.03 ng/mL (ref <0.031)

## 2015-05-05 LAB — PROTIME-INR
INR: 1.04 (ref 0.00–1.49)
Prothrombin Time: 13.8 seconds (ref 11.6–15.2)

## 2015-05-05 LAB — I-STAT TROPONIN, ED: Troponin i, poc: 0.01 ng/mL (ref 0.00–0.08)

## 2015-05-05 LAB — PREGNANCY, URINE: Preg Test, Ur: NEGATIVE

## 2015-05-05 LAB — BRAIN NATRIURETIC PEPTIDE: B Natriuretic Peptide: 229.3 pg/mL — ABNORMAL HIGH (ref 0.0–100.0)

## 2015-05-05 LAB — MRSA PCR SCREENING: MRSA by PCR: NEGATIVE

## 2015-05-05 SURGERY — LEFT HEART CATH AND CORONARY ANGIOGRAPHY
Anesthesia: LOCAL

## 2015-05-05 MED ORDER — MIDAZOLAM HCL 2 MG/2ML IJ SOLN
INTRAMUSCULAR | Status: DC | PRN
  Administered 2015-05-05: 2 mg via INTRAVENOUS

## 2015-05-05 MED ORDER — NITROGLYCERIN 1 MG/10 ML FOR IR/CATH LAB
INTRA_ARTERIAL | Status: AC
  Filled 2015-05-05: qty 10

## 2015-05-05 MED ORDER — ACETAMINOPHEN 500 MG PO TABS
1000.0000 mg | ORAL_TABLET | Freq: Once | ORAL | Status: AC
  Administered 2015-05-05: 1000 mg via ORAL
  Filled 2015-05-05: qty 2

## 2015-05-05 MED ORDER — ASPIRIN 81 MG PO CHEW
81.0000 mg | CHEWABLE_TABLET | ORAL | Status: AC
  Administered 2015-05-05: 81 mg via ORAL

## 2015-05-05 MED ORDER — ASPIRIN EC 81 MG PO TBEC: 81.0000 mg | DELAYED_RELEASE_TABLET | Freq: Every day | ORAL | Status: DC

## 2015-05-05 MED ORDER — ASPIRIN EC 81 MG PO TBEC
81.0000 mg | DELAYED_RELEASE_TABLET | Freq: Every day | ORAL | Status: DC
  Filled 2015-05-05: qty 1

## 2015-05-05 MED ORDER — INSULIN ASPART 100 UNIT/ML ~~LOC~~ SOLN
0.0000 [IU] | SUBCUTANEOUS | Status: DC
Start: 2015-05-05 — End: 2015-05-05
  Administered 2015-05-05: 7 [IU] via SUBCUTANEOUS

## 2015-05-05 MED ORDER — LOSARTAN POTASSIUM 25 MG PO TABS
25.0000 mg | ORAL_TABLET | Freq: Every day | ORAL | Status: DC
  Filled 2015-05-05: qty 1

## 2015-05-05 MED ORDER — ATORVASTATIN CALCIUM 40 MG PO TABS
40.0000 mg | ORAL_TABLET | Freq: Every day | ORAL | Status: DC
  Filled 2015-05-05: qty 1

## 2015-05-05 MED ORDER — VERAPAMIL HCL 2.5 MG/ML IV SOLN
INTRAVENOUS | Status: AC
  Filled 2015-05-05: qty 2

## 2015-05-05 MED ORDER — SODIUM CHLORIDE 0.9 % IJ SOLN: 3.0000 mL | Freq: Two times a day (BID) | INTRAMUSCULAR | Status: DC

## 2015-05-05 MED ORDER — NITROGLYCERIN 0.4 MG SL SUBL: 0.4000 mg | SUBLINGUAL_TABLET | SUBLINGUAL | Status: DC | PRN

## 2015-05-05 MED ORDER — SODIUM CHLORIDE 0.9 % IV SOLN: 250.0000 mL | INTRAVENOUS | Status: DC | PRN

## 2015-05-05 MED ORDER — SODIUM CHLORIDE 0.9 % IJ SOLN
INTRAMUSCULAR | Status: DC | PRN
  Administered 2015-05-05: 10:00:00 via INTRA_ARTERIAL

## 2015-05-05 MED ORDER — MORPHINE SULFATE 2 MG/ML IJ SOLN
2.0000 mg | INTRAMUSCULAR | Status: DC | PRN
Start: 2015-05-05 — End: 2015-05-05

## 2015-05-05 MED ORDER — HEPARIN (PORCINE) IN NACL 2-0.9 UNIT/ML-% IJ SOLN
INTRAMUSCULAR | Status: AC
  Filled 2015-05-05: qty 1500

## 2015-05-05 MED ORDER — NITROGLYCERIN 0.4 MG/HR TD PT24: 0.4000 mg | MEDICATED_PATCH | Freq: Every day | TRANSDERMAL | Status: DC

## 2015-05-05 MED ORDER — PNEUMOCOCCAL VAC POLYVALENT 25 MCG/0.5ML IJ INJ: 0.5000 mL | INJECTION | INTRAMUSCULAR | Status: DC

## 2015-05-05 MED ORDER — HEPARIN SODIUM (PORCINE) 1000 UNIT/ML IJ SOLN
INTRAMUSCULAR | Status: AC
  Filled 2015-05-05: qty 1

## 2015-05-05 MED ORDER — IOHEXOL 350 MG/ML SOLN
INTRAVENOUS | Status: DC | PRN
  Administered 2015-05-05: 100 mL via INTRAVENOUS

## 2015-05-05 MED ORDER — ENOXAPARIN SODIUM 40 MG/0.4ML ~~LOC~~ SOLN
40.0000 mg | SUBCUTANEOUS | Status: DC
  Filled 2015-05-05: qty 0.4

## 2015-05-05 MED ORDER — HEPARIN SODIUM (PORCINE) 1000 UNIT/ML IJ SOLN
INTRAMUSCULAR | Status: DC | PRN
  Administered 2015-05-05: 4000 [IU] via INTRAVENOUS

## 2015-05-05 MED ORDER — SODIUM CHLORIDE 0.9 % WEIGHT BASED INFUSION: 3.0000 mL/kg/h | INTRAVENOUS | Status: AC

## 2015-05-05 MED ORDER — SODIUM CHLORIDE 0.9 % IJ SOLN
3.0000 mL | Freq: Two times a day (BID) | INTRAMUSCULAR | Status: DC
  Administered 2015-05-05: 3 mL via INTRAVENOUS

## 2015-05-05 MED ORDER — SODIUM CHLORIDE 0.9 % IJ SOLN: 3.0000 mL | INTRAMUSCULAR | Status: DC | PRN

## 2015-05-05 MED ORDER — NITROGLYCERIN 0.4 MG SL SUBL
0.4000 mg | SUBLINGUAL_TABLET | SUBLINGUAL | Status: DC | PRN
Start: 2015-05-05 — End: 2015-05-05
  Administered 2015-05-05: 0.4 mg via SUBLINGUAL
  Filled 2015-05-05: qty 1

## 2015-05-05 MED ORDER — FENTANYL CITRATE (PF) 100 MCG/2ML IJ SOLN
INTRAMUSCULAR | Status: AC
  Filled 2015-05-05: qty 2

## 2015-05-05 MED ORDER — MIDAZOLAM HCL 2 MG/2ML IJ SOLN
INTRAMUSCULAR | Status: AC
  Filled 2015-05-05: qty 2

## 2015-05-05 MED ORDER — TICAGRELOR 90 MG PO TABS
90.0000 mg | ORAL_TABLET | Freq: Two times a day (BID) | ORAL | Status: DC
  Administered 2015-05-05: 90 mg via ORAL
  Filled 2015-05-05 (×2): qty 1

## 2015-05-05 MED ORDER — ONDANSETRON HCL 4 MG/2ML IJ SOLN: 4.0000 mg | Freq: Four times a day (QID) | INTRAMUSCULAR | Status: DC | PRN

## 2015-05-05 MED ORDER — ONDANSETRON HCL 4 MG/2ML IJ SOLN
INTRAMUSCULAR | Status: AC
  Administered 2015-05-05: 4 mg
  Filled 2015-05-05: qty 2

## 2015-05-05 MED ORDER — SODIUM CHLORIDE 0.9 % WEIGHT BASED INFUSION
3.0000 mL/kg/h | INTRAVENOUS | Status: DC
  Administered 2015-05-05: 2.975 mL/kg/h via INTRAVENOUS

## 2015-05-05 MED ORDER — SODIUM CHLORIDE 0.9 % WEIGHT BASED INFUSION: 1.0000 mL/kg/h | INTRAVENOUS | Status: DC

## 2015-05-05 MED ORDER — LIDOCAINE HCL (PF) 1 % IJ SOLN
INTRAMUSCULAR | Status: AC
  Filled 2015-05-05: qty 30

## 2015-05-05 MED ORDER — FENTANYL CITRATE (PF) 100 MCG/2ML IJ SOLN
INTRAMUSCULAR | Status: DC | PRN
  Administered 2015-05-05: 25 ug via INTRAVENOUS

## 2015-05-05 MED ORDER — LIDOCAINE HCL (PF) 1 % IJ SOLN
INTRAMUSCULAR | Status: DC | PRN
  Administered 2015-05-05: 2 mL

## 2015-05-05 MED ORDER — CARVEDILOL 3.125 MG PO TABS
3.1250 mg | ORAL_TABLET | Freq: Two times a day (BID) | ORAL | Status: DC
  Administered 2015-05-05: 3.125 mg via ORAL
  Filled 2015-05-05 (×3): qty 1

## 2015-05-05 MED ORDER — ACETAMINOPHEN 325 MG PO TABS: 650.0000 mg | ORAL_TABLET | ORAL | Status: DC | PRN

## 2015-05-05 SURGICAL SUPPLY — 14 items

## 2015-05-05 NOTE — Discharge Instructions (Signed)

## 2015-05-05 NOTE — Progress Notes (Signed)
Pt discharged via [facility] with family.

## 2015-05-05 NOTE — ED Notes (Signed)
MD at bedside. 

## 2015-05-05 NOTE — H&P (View-Only) (Signed)
CARDIOLOGY CONSULT NOTE   Patient ID: Shamonte Pangan MRN: [id number] DOB/AGE: 05-16-1981 34 y.o.  Admit Date: 05/05/2015  Primary Physician: Kathlyn Sacramento, MD  Primary Cardiologist   Arida  Clinical Summary Ms. Teruel is a 34 y.o.female.  She has known coronary disease. She had an intervention by Dr. Fletcher Anon done at [facility] in April, 2016. Her initial presenting symptom was tightness in the chest and shortness of breath. She has been taking her medicines. She was actually seen in the office in [place] on May 25 with some dizziness. She was not having chest tightness at that time. However she awoke at 2 AM today feeling tightness in her chest. She did have some nausea and diaphoresis. Her symptoms were relieved eventually with 3 nitroglycerins. Troponins so far negative.     Allergies  Allergen Reactions  . Lisinopril Swelling  . Shellfish Allergy Swelling    Medications Scheduled Medications: . aspirin EC  81 mg Oral Daily  . atorvastatin  40 mg Oral Daily  . carvedilol  3.125 mg Oral BID WC  . enoxaparin (LOVENOX) injection  40 mg Subcutaneous Q24H  . insulin aspart  0-9 Units Subcutaneous Q4H  . losartan  25 mg Oral Daily  . [START ON 05/06/2015] pneumococcal 23 valent vaccine  0.5 mL Intramuscular Tomorrow-1000  . ticagrelor  90 mg Oral BID     Infusions:     PRN Medications:  acetaminophen, morphine injection, nitroGLYCERIN, ondansetron (ZOFRAN) IV   Past Medical History  Diagnosis Date  . CAD (coronary artery disease)     a. NSTEMI 03/2015: cath LM nl, pLAD tubular 30%, mLCx 100% s/p PCI/DES, OM1 90% s/p PCI/DES, RCA minor irregs, EF 35%;   Marland Kitchen Chronic systolic CHF (congestive heart failure)     a. echo 03/2015: EF 30-35%, mild concentric LVH, severe HK of inf, inflat, & lat walls, mod MR, mild TR  . Ischemic cardiomyopathy   . DM2 (diabetes mellitus, type 2)     a. since 2002  . Tobacco abuse   . Obesity   . MI (myocardial infarction)   .  Hyperlipidemia     Past Surgical History  Procedure Laterality Date  . Mouth surgery    . Cardiac catheterization  4/16    x2 stent [facility]    Family History  Problem Relation Age of Onset  . Family history unknown: Yes    Social History Ms. Aquilina reports that she has quit smoking. She does not have any smokeless tobacco history on file. Ms. Burham reports that she does not drink alcohol.  Review of Systems   Patient denies fever, chills, headache, sweats, rash, change in vision, change in hearing, cough, urinary symptoms. All other systems are reviewed and are negative.   Physical Examination Blood pressure 122/73, pulse 96, temperature 97.6 F (36.4 C), temperature source Oral, resp. rate 18, height 5\' 4"  (1.626 m), weight 170 lb 6.7 oz (77.3 kg), last menstrual period 04/27/2015, SpO2 100 %. No intake or output data in the 24 hours ending 05/05/15 [id number] Patient is oriented to person time and place. Affect is normal. Head is atraumatic. Sclera and conjunctiva are normal. There is no jugulovenous distention. Lungs are clear. Respiratory effort is unlabored. Cardiac exam reveals an S1 and S2. Abdomen is soft. There is no peripheral edema. There are no musculoskeletal deformities. There are no skin rashes.  Lab Results  Basic Metabolic Panel:  Recent Labs Lab 05/05/15 0332  NA 135  K 4.2  CL 102  CO2 19*  GLUCOSE 177*  BUN 16  CREATININE 0.90  CALCIUM 8.6*    Liver Function Tests:  Recent Labs Lab 05/05/15 0332  AST 20  ALT 15  ALKPHOS 83  BILITOT 0.5  PROT 6.9  ALBUMIN 3.1*    CBC:  Recent Labs Lab 05/05/15 0332  WBC 7.1  NEUTROABS 3.5  HGB 10.3*  HCT 31.2*  MCV 73.4*  PLT 406*    Cardiac Enzymes: No results for input(s): CKTOTAL, CKMB, CKMBINDEX, TROPONINI in the last 168 hours.  BNP: Invalid input(s): POCBNP   Radiology: Dg Chest 2 View  05/05/2015   CLINICAL DATA:  Chest pain and shortness of breath  EXAM: CHEST  2 VIEW  COMPARISON:   None.  FINDINGS: Normal heart size and mediastinal contours. No acute infiltrate or edema. No effusion or pneumothorax. No acute osseous findings.  IMPRESSION: Negative chest.   Electronically Signed   By: Monte Fantasia M.D.   On: 05/05/2015 04:06     ECG: I have reviewed current and older EKGs. There is no ST elevation. The T-wave inversions are more diffuse at this time than they were prior to admission.  Telemetry:    I have reviewed telemetry today May 05, 2015. There is normal sinus rhythm.   Impression and Recommendations    Chest pain      The patient had a non-STEMI in April, 2016. Presentation at that time was with shortness of breath and chest tightness. She woke this morning at 2 AM having shortness of breath and chest tightness for the first time since hospitalization. There was eventual relief with 3 nitroglycerin's. There is no diagnostic EKG changes at this time. Her first troponin is normal. Her presentation is concerning. She needs a relook cath today and this is being arranged for this morning. She has been taking her medications.    Chronic systolic CHF (congestive heart failure)      Volume status is stable. No change in therapy.    Ischemic cardiomyopathy     EF in April at the time of her acute event was in the range of 30%.    Daryel November, MD 05/05/2015, 8:21 AM

## 2015-05-05 NOTE — Progress Notes (Signed)
1100: Pt arrived from cardiac cath to 2w30, alert and oriented x4, vitals stable. TR band monitored per order. 1600: Pt TR band removed and pressure bandage applied, no swelling or bleeeding noted.  Discharge instructions and follow up appts gone over with pt. Pt verbalised understanding of DC instructions.

## 2015-05-05 NOTE — Progress Notes (Signed)
Pt has signed discharge paper work; waiting on ride to arrive for discharge.  No complaints, concerns or issues at this time.

## 2015-05-05 NOTE — Consult Note (Signed)
CARDIOLOGY CONSULT NOTE   Patient ID: Nieka Blaes MRN: [id number] DOB/AGE: 12/13/1980 34 y.o.  Admit Date: 05/05/2015  Primary Physician: Kathlyn Sacramento, MD  Primary Cardiologist   Arida  Clinical Summary Ms. Cordwell is a 34 y.o.female.  She has known coronary disease. She had an intervention by Dr. Fletcher Anon done at [facility] in April, 2016. Her initial presenting symptom was tightness in the chest and shortness of breath. She has been taking her medicines. She was actually seen in the office in [place] on May 25 with some dizziness. She was not having chest tightness at that time. However she awoke at 2 AM today feeling tightness in her chest. She did have some nausea and diaphoresis. Her symptoms were relieved eventually with 3 nitroglycerins. Troponins so far negative.     Allergies  Allergen Reactions  . Lisinopril Swelling  . Shellfish Allergy Swelling    Medications Scheduled Medications: . aspirin EC  81 mg Oral Daily  . atorvastatin  40 mg Oral Daily  . carvedilol  3.125 mg Oral BID WC  . enoxaparin (LOVENOX) injection  40 mg Subcutaneous Q24H  . insulin aspart  0-9 Units Subcutaneous Q4H  . losartan  25 mg Oral Daily  . [START ON 05/06/2015] pneumococcal 23 valent vaccine  0.5 mL Intramuscular Tomorrow-1000  . ticagrelor  90 mg Oral BID     Infusions:     PRN Medications:  acetaminophen, morphine injection, nitroGLYCERIN, ondansetron (ZOFRAN) IV   Past Medical History  Diagnosis Date  . CAD (coronary artery disease)     a. NSTEMI 03/2015: cath LM nl, pLAD tubular 30%, mLCx 100% s/p PCI/DES, OM1 90% s/p PCI/DES, RCA minor irregs, EF 35%;   Marland Kitchen Chronic systolic CHF (congestive heart failure)     a. echo 03/2015: EF 30-35%, mild concentric LVH, severe HK of inf, inflat, & lat walls, mod MR, mild TR  . Ischemic cardiomyopathy   . DM2 (diabetes mellitus, type 2)     a. since 2002  . Tobacco abuse   . Obesity   . MI (myocardial infarction)   .  Hyperlipidemia     Past Surgical History  Procedure Laterality Date  . Mouth surgery    . Cardiac catheterization  4/16    x2 stent [facility]    Family History  Problem Relation Age of Onset  . Family history unknown: Yes    Social History Ms. Callaway reports that she has quit smoking. She does not have any smokeless tobacco history on file. Ms. Picker reports that she does not drink alcohol.  Review of Systems   Patient denies fever, chills, headache, sweats, rash, change in vision, change in hearing, cough, urinary symptoms. All other systems are reviewed and are negative.   Physical Examination Blood pressure 122/73, pulse 96, temperature 97.6 F (36.4 C), temperature source Oral, resp. rate 18, height 5\' 4"  (1.626 m), weight 170 lb 6.7 oz (77.3 kg), last menstrual period 04/27/2015, SpO2 100 %. No intake or output data in the 24 hours ending 05/05/15 [id number] Patient is oriented to person time and place. Affect is normal. Head is atraumatic. Sclera and conjunctiva are normal. There is no jugulovenous distention. Lungs are clear. Respiratory effort is unlabored. Cardiac exam reveals an S1 and S2. Abdomen is soft. There is no peripheral edema. There are no musculoskeletal deformities. There are no skin rashes.  Lab Results  Basic Metabolic Panel:  Recent Labs Lab 05/05/15 0332  NA 135  K 4.2  CL 102  CO2 19*  GLUCOSE 177*  BUN 16  CREATININE 0.90  CALCIUM 8.6*    Liver Function Tests:  Recent Labs Lab 05/05/15 0332  AST 20  ALT 15  ALKPHOS 83  BILITOT 0.5  PROT 6.9  ALBUMIN 3.1*    CBC:  Recent Labs Lab 05/05/15 0332  WBC 7.1  NEUTROABS 3.5  HGB 10.3*  HCT 31.2*  MCV 73.4*  PLT 406*    Cardiac Enzymes: No results for input(s): CKTOTAL, CKMB, CKMBINDEX, TROPONINI in the last 168 hours.  BNP: Invalid input(s): POCBNP   Radiology: Dg Chest 2 View  05/05/2015   CLINICAL DATA:  Chest pain and shortness of breath  EXAM: CHEST  2 VIEW  COMPARISON:   None.  FINDINGS: Normal heart size and mediastinal contours. No acute infiltrate or edema. No effusion or pneumothorax. No acute osseous findings.  IMPRESSION: Negative chest.   Electronically Signed   By: Monte Fantasia M.D.   On: 05/05/2015 04:06     ECG: I have reviewed current and older EKGs. There is no ST elevation. The T-wave inversions are more diffuse at this time than they were prior to admission.  Telemetry:    I have reviewed telemetry today May 05, 2015. There is normal sinus rhythm.   Impression and Recommendations    Chest pain      The patient had a non-STEMI in April, 2016. Presentation at that time was with shortness of breath and chest tightness. She woke this morning at 2 AM having shortness of breath and chest tightness for the first time since hospitalization. There was eventual relief with 3 nitroglycerin's. There is no diagnostic EKG changes at this time. Her first troponin is normal. Her presentation is concerning. She needs a relook cath today and this is being arranged for this morning. She has been taking her medications.    Chronic systolic CHF (congestive heart failure)      Volume status is stable. No change in therapy.    Ischemic cardiomyopathy     EF in April at the time of her acute event was in the range of 30%.    Daryel November, MD 05/05/2015, 8:21 AM

## 2015-05-05 NOTE — Discharge Summary (Signed)
DISCHARGE SUMMARY  [name] [name]  MR#: [id number]  DOB:[birth year]  Date of Admission: 05/05/2015 Date of Discharge: 05/05/2015  Attending Physician:MCCLUNG,JEFFREY T  Patient's [id number] Fletcher Anon, MD  Consults:  [facility]   Disposition: D/C home   Follow-up Appts:       Follow-up Information    Follow up with Kathlyn Sacramento, MD In 1 week.   Specialty:  Cardiology   Contact information:   [place] [address] [phone]       Follow up with [place]    . Schedule an appointment as soon as possible for a visit in 1 week.   Contact information:   [place] [phone]     Tests Needing Follow-up: - further investigation of potential causes of noncardiac chest pain will be indicated   Discharge Diagnoses: Chest pain DM2 Systolic CHF - ischemic cardiomyopathy Tobacco abuse Chronic anemia   Initial presentation: 34 y.o. female with known history of CAD status post stenting and ischemic cardiomyopathy w/ EF 30-35%, and diabetes mellitus type 2 who presented to the ER because of chest pain. Patient was awakened from sleep by chest pain and shortness of breath. Chest pain was retrosternal pressure-like with some nausea and shortness of breath.  EMS was called and patient was given sublingual nitroglycerin following which patient's symptoms improved. EKG noted inferolateral T-wave changes and cardiac markers were negative.   Hospital Course:  Chest pain Patient was admitted to the acute unit and cardiology was consulted.  The patient was taken to the cath lab on 5/27 and underwent a left heart cath.  Cath noted the drug-eluting stent in the ramus intermedius was patent and the drug-eluting stent in the left circumflex was patent.  The patient's known proximal LAD lesion was noted to be 40% stenosed.  Normal LV function was appreciated on the cardiac cath.  Cardiology  suggested the patient continue her medical therapy and that she was otherwise stable for discharge from their standpoint.  The nature of the pain was not convincing for PE, and the pt had no sx to suggest LE DVT.  The patient was chest pain free at the time of her d/c.     DM2 CBGs were stable during his hospital stay  Systolic CHF - ischemic cardiomyopathy Well compensated during this hospital stay - baseline weight appears to be approximately 77 kg - patient was 77.3 kg at time of discharge  Tobacco abuse Patient was counseled extensively as to the absolute need to discontinue tobacco use  Chronic anemia  Hemoglobin was stable during hospital stay    Medication List    TAKE these medications        aspirin 81 MG tablet  Take 81 mg by mouth daily.     atorvastatin 40 MG tablet  Commonly known as:  LIPITOR  Take 40 mg by mouth daily.     carvedilol 3.125 MG tablet  Commonly known as:  COREG  Take 3.125 mg by mouth 2 (two) times daily with a meal.     losartan 25 MG tablet  Commonly known as:  COZAAR  Take 1 tablet (25 mg total) by mouth daily.     metFORMIN 500 MG tablet  Commonly known as:  GLUCOPHAGE  Take 1 tablet (500 mg total) by mouth 2 (two) times daily with a meal.     nicotine 7 mg/24hr patch  Commonly known as:  NICODERM CQ - dosed in  mg/24 hr  Place 7 mg onto the skin daily.     nitroGLYCERIN 0.4 mg/hr patch  Commonly known as:  NITRODUR - Dosed in mg/24 hr  Place 0.4 mg onto the skin daily.     nitroGLYCERIN 0.4 MG SL tablet  Commonly known as:  NITROSTAT  Place 0.4 mg under the tongue every 5 (five) minutes as needed for chest pain.     ticagrelor 90 MG Tabs tablet  Commonly known as:  BRILINTA  Take 90 mg by mouth 2 (two) times daily.       Day of Discharge BP 100/59 mmHg  Pulse 90  Temp(Src) 98.4 F (36.9 C) (Oral)  Resp 20  Ht 5\' 4"  (1.626 m)  Wt 77.3 kg (170 lb 6.7 oz)  BMI 29.24 kg/m2  SpO2 100%  LMP 04/27/2015  Physical  Exam: General: No acute respiratory distress Lungs: Clear to auscultation bilaterally without wheezes or crackles Cardiovascular: Regular rate and rhythm without murmur gallop or rub normal S1 and S2 Abdomen: Nontender, nondistended, soft, bowel sounds positive, no rebound, no ascites, no appreciable mass Extremities: No significant cyanosis, clubbing, or edema bilateral lower extremities - no calf tenderness or enlargement   Basic Metabolic Panel:  Recent Labs Lab 05/05/15 0332 05/05/15 0832  NA 135 135  K 4.2 3.9  CL 102 108  CO2 19* 20*  GLUCOSE 177* 176*  BUN 16 14  CREATININE 0.90 0.67  CALCIUM 8.6* 8.6*    Liver Function Tests:  Recent Labs Lab 05/05/15 0332 05/05/15 0832  AST 20 21  ALT 15 16  ALKPHOS 83 80  BILITOT 0.5 0.6  PROT 6.9 6.8  ALBUMIN 3.1* 3.1*    Recent Labs Lab 05/05/15 0832  LIPASE 23   Coags:  Recent Labs Lab 05/05/15 0359  INR 1.04   CBC:  Recent Labs Lab 05/05/15 0332 05/05/15 0832  WBC 7.1 8.3  NEUTROABS 3.5 4.9  HGB 10.3* 10.5*  HCT 31.2* 31.6*  MCV 73.4* 73.8*  PLT 406* 377   Cardiac Enzymes:  Recent Labs Lab 05/05/15 0832  TROPONINI <0.03   CBG:  Recent Labs Lab 05/05/15 0807 05/05/15 1100  GLUCAP 165* 165*    Recent Results (from the past 240 hour(s))  MRSA PCR Screening     Status: None   Collection Time: 05/05/15  5:02 AM  Result Value Ref Range Status   MRSA by PCR NEGATIVE NEGATIVE Final    Comment:        The GeneXpert MRSA Assay (FDA approved for NASAL specimens only), is one component of a comprehensive MRSA colonization surveillance program. It is not intended to diagnose MRSA infection nor to guide or monitor treatment for MRSA infections.       Time spent in discharge (includes decision making & examination of pt): 30 minutes  05/05/2015, 1:35 PM   Cherene Altes, MD [facility] Office  [phone] Pager [phone]  On-Call/Text Page:      [email or web address]       password [facility]

## 2015-05-05 NOTE — Interval H&P Note (Signed)
History and Physical Interval Note:  05/05/2015 9:16 AM  [name] [name]  has presented today for surgery, with the diagnosis of acute cp  The various methods of treatment have been discussed with the patient and family. After consideration of risks, benefits and other options for treatment, the patient has consented to  Procedure(s): Left Heart Cath and Coronary Angiography (N/A) as a surgical intervention .  The patient's history has been reviewed, patient examined, no change in status, stable for surgery.  I have reviewed the patient's chart and labs.  Questions were answered to the patient's satisfaction.   Cath Lab Visit (complete for each Cath Lab visit)  Clinical Evaluation Leading to the Procedure:   ACS: Yes.    Non-ACS:    Anginal Classification: CCS III  Anti-ischemic medical therapy: Maximal Therapy (2 or more classes of medications)  Non-Invasive Test Results: No non-invasive testing performed  Prior CABG: No previous CABG        [name] [name] [facility] 05/05/2015 9:17 AM

## 2015-05-05 NOTE — ED Provider Notes (Signed)
CSN: [id number]     Arrival date & time 05/05/15  0305 History  This chart was scribed for Everlene Balls, MD by Evelene Croon, ED Scribe. This patient was seen in room D34C/D34C and the patient's care was started 3:13 AM.     Chief Complaint  Patient presents with  . Chest Pain   The history is provided by the patient. No language interpreter was used.     HPI Comments:  [name] [name] is a 34 y.o. female with a history of recent MI who presents to the Emergency Department via ambulance complaining of CP that woke her from sleep this AM. She describes her pain as a pressure. She denies pain at this time. Pt reports associated SOB, jaw pain, nausea and mild dizziness. She received a full dose of ASA en route. She denies vomiting. Pt had 2 cardiac stent placed April 20th. Symptoms today are similar to last MI in April 2016.   Past Medical History  Diagnosis Date  . CAD (coronary artery disease)     a. NSTEMI 03/2015: cath LM nl, pLAD tubular 30%, mLCx 100% s/p PCI/DES, OM1 90% s/p PCI/DES, RCA minor irregs, EF 35%;   Marland Kitchen Chronic systolic CHF (congestive heart failure)     a. echo 03/2015: EF 30-35%, mild concentric LVH, severe HK of inf, inflat, & lat walls, mod MR, mild TR  . Ischemic cardiomyopathy   . DM2 (diabetes mellitus, type 2)     a. since 2002  . Tobacco abuse   . Obesity   . MI (myocardial infarction)   . Hyperlipidemia    Past Surgical History  Procedure Laterality Date  . Mouth surgery    . Cardiac catheterization  4/16    x2 stent [facility]   Family History  Problem Relation Age of Onset  . Family history unknown: Yes   History  Substance Use Topics  . Smoking status: Former Research scientist (life sciences)  . Smokeless tobacco: Not on file  . Alcohol Use: No   OB History    No data available     Review of Systems 10 Systems reviewed and all are negative for acute change except as noted in the HPI.     Allergies  Lisinopril and Shellfish allergy  Home Medications   Prior to  Admission medications   Medication Sig Start Date End Date Taking? Authorizing Provider  aspirin 81 MG tablet Take 81 mg by mouth daily.    Historical Provider, MD  atorvastatin (LIPITOR) 40 MG tablet Take 40 mg by mouth daily.    Historical Provider, MD  carvedilol (COREG) 3.125 MG tablet Take 3.125 mg by mouth 2 (two) times daily with a meal.    Historical Provider, MD  losartan (COZAAR) 25 MG tablet Take 1 tablet (25 mg total) by mouth daily. 04/15/15   Evelene Croon Barrett, PA-C  metFORMIN (GLUCOPHAGE) 500 MG tablet Take 1 tablet (500 mg total) by mouth 2 (two) times daily with a meal. 04/27/15 04/26/16  Orbie Pyo, MD  nicotine (NICODERM CQ - DOSED IN MG/24 HR) 7 mg/24hr patch Place 7 mg onto the skin daily.    Historical Provider, MD  nitroGLYCERIN (NITRODUR - DOSED IN MG/24 HR) 0.4 mg/hr patch Place 0.4 mg onto the skin daily.    Historical Provider, MD  ticagrelor (BRILINTA) 90 MG TABS tablet Take 90 mg by mouth 2 (two) times daily.    Historical Provider, MD   LMP 04/27/2015 Physical Exam  Constitutional: She is oriented to person, place, and  time. She appears well-developed and well-nourished. She appears distressed.  HENT:  Head: Normocephalic and atraumatic.  Nose: Nose normal.  Mouth/Throat: Oropharynx is clear and moist. No oropharyngeal exudate.  Eyes: Conjunctivae and EOM are normal. Pupils are equal, round, and reactive to light. No scleral icterus.  Neck: Normal range of motion. Neck supple. No JVD present. No tracheal deviation present. No thyromegaly present.  Cardiovascular: Normal rate, regular rhythm and normal heart sounds.  Exam reveals no gallop and no friction rub.   No murmur heard. Pulmonary/Chest: Effort normal and breath sounds normal. No respiratory distress. She has no wheezes. She exhibits no tenderness.  Abdominal: Soft. Bowel sounds are normal. She exhibits no distension and no mass. There is no tenderness. There is no rebound and no guarding.   Musculoskeletal: Normal range of motion. She exhibits no edema or tenderness.  Lymphadenopathy:    She has no cervical adenopathy.  Neurological: She is alert and oriented to person, place, and time. No cranial nerve deficit. She exhibits normal muscle tone.  Skin: Skin is warm and dry. No rash noted. No erythema. No pallor.  Nursing note and vitals reviewed.   ED Course  Procedures   DIAGNOSTIC STUDIES:  Oxygen Saturation is 97% on RA, normal by my interpretation.    COORDINATION OF CARE:  3:19 AM Pt aware of possible admission for observation. Discussed treatment plan with pt at bedside and pt agreed to plan.  Labs Review Labs Reviewed  BRAIN NATRIURETIC PEPTIDE - Abnormal; Notable for the following:    B Natriuretic Peptide 229.3 (*)    All other components within normal limits  CBC WITH DIFFERENTIAL/PLATELET - Abnormal; Notable for the following:    Hemoglobin 10.3 (*)    HCT 31.2 (*)    MCV 73.4 (*)    MCH 24.2 (*)    Platelets 406 (*)    All other components within normal limits  COMPREHENSIVE METABOLIC PANEL - Abnormal; Notable for the following:    CO2 19 (*)    Glucose, Bld 177 (*)    Calcium 8.6 (*)    Albumin 3.1 (*)    All other components within normal limits  CBC WITH DIFFERENTIAL/PLATELET - Abnormal; Notable for the following:    Hemoglobin 10.5 (*)    HCT 31.6 (*)    MCV 73.8 (*)    MCH 24.5 (*)    All other components within normal limits  COMPREHENSIVE METABOLIC PANEL - Abnormal; Notable for the following:    CO2 20 (*)    Glucose, Bld 176 (*)    Calcium 8.6 (*)    Albumin 3.1 (*)    All other components within normal limits  GLUCOSE, CAPILLARY - Abnormal; Notable for the following:    Glucose-Capillary 165 (*)    All other components within normal limits  GLUCOSE, CAPILLARY - Abnormal; Notable for the following:    Glucose-Capillary 165 (*)    All other components within normal limits  GLUCOSE, CAPILLARY - Abnormal; Notable for the  following:    Glucose-Capillary 151 (*)    All other components within normal limits  MRSA PCR SCREENING  PROTIME-INR  TROPONIN I  TROPONIN I  LIPASE, BLOOD  URINE RAPID DRUG SCREEN (HOSP PERFORMED) NOT AT [facility]  PREGNANCY, URINE  HEMOGLOBIN A1C  CBC  I-STAT TROPOININ, ED    Imaging Review Dg Chest 2 View  05/05/2015   CLINICAL DATA:  Chest pain and shortness of breath  EXAM: CHEST  2 VIEW  COMPARISON:  None.  FINDINGS: Normal heart size and mediastinal contours. No acute infiltrate or edema. No effusion or pneumothorax. No acute osseous findings.  IMPRESSION: Negative chest.   Electronically Signed   By: Monte Fantasia M.D.   On: 05/05/2015 04:06     EKG Interpretation   Date/Time:  Friday May 05 2015 03:19:23 EDT Ventricular Rate:  94 PR Interval:  190 QRS Duration: 53 QT Interval:  357 QTC Calculation: 446 R Axis:   73 Text Interpretation:  Sinus rhythm No significant change since last  tracing Confirmed by Glynn Octave [phone]) on 05/05/2015 3:23:16 AM      MDM   Final diagnoses:  None   Patient presents to the ED for CP concerning for ACS.  States this feels like her prior MI.  EKG shows T wave changes.  Will admit for cardiac work up.   I personally performed the services described in this documentation, which was scribed in my presence. The recorded information has been reviewed and is accurate.    Everlene Balls, MD 05/05/15 [phone]

## 2015-05-05 NOTE — H&P (Signed)
[facility] History and Physical  [name] [id number] DOB: March 27, 1981 DOA: 05/05/2015  Referring physician: dR.oNI. PCP: Kathlyn Sacramento, MD  Specialists: Se.PCP.  Chief Complaint: Chest pain.  HPI: [name] [name] is a 34 y.o. female with known history of CAD status post stenting and ischemic cardiomyopathy last year measured was 30-35%, diabetes mellitus type 2 presents to the ER because of chest pain. Patient states at around 2 AM this morning patient was awakened by his chest pain and shortness of breath. Chest pain as retrosternal pressure-like with some nausea and shortness of breath. Denies any cough fever chills or diaphoresis. EMS was called and patient was given sublingual nitroglycerin following which patient's symptoms improved. Presently patient has some nausea but denies any chest pain. Patient's EKG was showing some inferolateral T-wave changes and cardiac markers were negative. Patient has been admitted for further management of chest pain concerning for ACS. Patient states she has been taking her medications as advised. Patient has been running high blood sugars recently. Patient was placed on metformin.   Review of Systems: As presented in the history of presenting illness, rest negative.  Past Medical History  Diagnosis Date  . CAD (coronary artery disease)     a. NSTEMI 03/2015: cath LM nl, pLAD tubular 30%, mLCx 100% s/p PCI/DES, OM1 90% s/p PCI/DES, RCA minor irregs, EF 35%;   Marland Kitchen Chronic systolic CHF (congestive heart failure)     a. echo 03/2015: EF 30-35%, mild concentric LVH, severe HK of inf, inflat, & lat walls, mod MR, mild TR  . Ischemic cardiomyopathy   . DM2 (diabetes mellitus, type 2)     a. since 2002  . Tobacco abuse   . Obesity   . MI (myocardial infarction)   . Hyperlipidemia    Past Surgical History  Procedure Laterality Date  . Mouth surgery    . Cardiac catheterization  4/16    x2 stent [facility]   Social History:  reports that she has  quit smoking. She does not have any smokeless tobacco history on file. She reports that she does not drink alcohol or use illicit drugs. Where does patient live at home. Can patient participate in ADLs? Yes.  Allergies  Allergen Reactions  . Lisinopril Swelling  . Shellfish Allergy Swelling    Family History:  Family History  Problem Relation Age of Onset  . Family history unknown: Yes      Prior to Admission medications   Medication Sig Start Date End Date Taking? Authorizing Provider  aspirin 81 MG tablet Take 81 mg by mouth daily.   Yes Historical Provider, MD  atorvastatin (LIPITOR) 40 MG tablet Take 40 mg by mouth daily.   Yes Historical Provider, MD  carvedilol (COREG) 3.125 MG tablet Take 3.125 mg by mouth 2 (two) times daily with a meal.   Yes Historical Provider, MD  losartan (COZAAR) 25 MG tablet Take 1 tablet (25 mg total) by mouth daily. 04/15/15  Yes Rhonda G Barrett, PA-C  metFORMIN (GLUCOPHAGE) 500 MG tablet Take 1 tablet (500 mg total) by mouth 2 (two) times daily with a meal. 04/27/15 04/26/16 Yes Orbie Pyo, MD  nitroGLYCERIN (NITROSTAT) 0.4 MG SL tablet Place 0.4 mg under the tongue every 5 (five) minutes as needed for chest pain.   Yes Historical Provider, MD  ticagrelor (BRILINTA) 90 MG TABS tablet Take 90 mg by mouth 2 (two) times daily.   Yes Historical Provider, MD  nicotine (NICODERM CQ - DOSED IN MG/24 HR) 7 mg/24hr patch  Place 7 mg onto the skin daily.    Historical Provider, MD  nitroGLYCERIN (NITRODUR - DOSED IN MG/24 HR) 0.4 mg/hr patch Place 0.4 mg onto the skin daily.    Historical Provider, MD    Physical Exam: Filed Vitals:   05/05/15 0415 05/05/15 0430 05/05/15 0500 05/05/15 0502  BP: 122/82 121/76  121/76  Pulse: 94 94  93  Temp:    97.6 F (36.4 C)  TempSrc:    Oral  Resp: 20 15  13   Height:   5\' 4"  (1.626 m)   Weight:   77.3 kg (170 lb 6.7 oz)   SpO2: 100% 100%  100%     General:  Well-developed and nourished.  Eyes:  Anicteric no pallor.  ENT: No discharge from the ears eyes nose and mouth.  Neck: No mass felt. No JVD appreciated.   Cardiovascular: S1-S2 heard.  Respiratory: No rhonchi or crepitations.  Abdomen: Soft nontender bowel sounds present.  Skin: No rash.  Musculoskeletal: No edema.  Psychiatric: Appears normal.  Neurologic: Alert awake oriented to time place and person. Moves all extremities.  Labs on Admission:  Basic Metabolic Panel:  Recent Labs Lab 05/05/15 0332  NA 135  K 4.2  CL 102  CO2 19*  GLUCOSE 177*  BUN 16  CREATININE 0.90  CALCIUM 8.6*   Liver Function Tests:  Recent Labs Lab 05/05/15 0332  AST 20  ALT 15  ALKPHOS 83  BILITOT 0.5  PROT 6.9  ALBUMIN 3.1*   No results for input(s): LIPASE, AMYLASE in the last 168 hours. No results for input(s): AMMONIA in the last 168 hours. CBC:  Recent Labs Lab 05/05/15 0332  WBC 7.1  NEUTROABS 3.5  HGB 10.3*  HCT 31.2*  MCV 73.4*  PLT 406*   Cardiac Enzymes: No results for input(s): CKTOTAL, CKMB, CKMBINDEX, TROPONINI in the last 168 hours.  BNP (last 3 results)  Recent Labs  05/05/15 0332  BNP 229.3*    ProBNP (last 3 results) No results for input(s): PROBNP in the last 8760 hours.  CBG: No results for input(s): GLUCAP in the last 168 hours.  Radiological Exams on Admission: Dg Chest 2 View  05/05/2015   CLINICAL DATA:  Chest pain and shortness of breath  EXAM: CHEST  2 VIEW  COMPARISON:  None.  FINDINGS: Normal heart size and mediastinal contours. No acute infiltrate or edema. No effusion or pneumothorax. No acute osseous findings.  IMPRESSION: Negative chest.   Electronically Signed   By: Monte Fantasia M.D.   On: 05/05/2015 04:06    EKG: Independently reviewed. Normal sinus rhythm with inferolateral T-wave changes.  Assessment/Plan Principal Problem:   Chest pain Active Problems:   Chronic systolic CHF (congestive heart failure)   Ischemic cardiomyopathy   DM2 (diabetes  mellitus, type 2)   Pain in the chest   1. Chest pain with recent stent placement concerning for ACS - I have consulted cardiology. Patient will be kept nothing by mouth in anticipation of possible procedures. Cycle cardiac markers. When necessary nitroglycerin. Continue patient's BRILINTA and aspirin. Patient is on Coreg and statins. 2. Diabetes mellitus type 2 - patient had recently running high blood sugars. Check hemoglobin A1c. Patient was placed on metformin which will be held at this time in anticipation of possible procedures. I have placed patient on sliding scale coverage. Closely follow CBGs. Patient's bicarbonate is in the lower and recheck metabolic panel. 3. Systolic heart failure/ischemic cardio myopathy last EF measured was 30-35% - patient  is allergic to ace inhibitors and was changed to Cozaar. Vision appears compensated at this time. 4. Tobacco abuse which patient quit since last stent placement. 5. Chronic anemia - follow CBC.  Patient may need outpatient PCP follow-up arrangement.   DVT Prophylaxis Lovenox.  Code Status: Full code.  Family Communication: Discussed with patient.  Disposition Plan: Admit for observation.    [name] [name] N. [facility] Pager [phone].  If 7PM-7AM, please contact night-coverage [email or web address] Password [facility] 05/05/2015, 6:14 AM

## 2015-05-05 NOTE — ED Notes (Signed)
Pt reports she woke up this morning at 0200 with centralized chest pressure with associated nausea, SOB, and left upper back pain (scapula area). Pt had stents placed in April of this year. EMS adm 324 aspirin and 3 nitroglycerin. PT denies chest pressure after nitroglycerin, however back pain still is present. A&O X4.

## 2015-05-06 LAB — HEMOGLOBIN A1C
Hgb A1c MFr Bld: 14.2 % — ABNORMAL HIGH (ref 4.8–5.6)
Mean Plasma Glucose: 361 mg/dL

## 2015-05-09 MED FILL — Heparin Sodium (Porcine) 2 Unit/ML in Sodium Chloride 0.9%: INTRAMUSCULAR | Qty: 1500 | Status: AC

## 2015-05-16 ENCOUNTER — Encounter: Payer: Self-pay | Admitting: Cardiovascular Disease

## 2015-05-16 ENCOUNTER — Ambulatory Visit (INDEPENDENT_AMBULATORY_CARE_PROVIDER_SITE_OTHER): Payer: Self-pay | Admitting: Nurse Practitioner

## 2015-05-16 ENCOUNTER — Other Ambulatory Visit: Payer: Self-pay

## 2015-05-16 VITALS — BP 90/60 | HR 94 | Ht 64.0 in | Wt 169.5 lb

## 2015-05-16 DIAGNOSIS — E785 Hyperlipidemia, unspecified: Secondary | ICD-10-CM

## 2015-05-16 DIAGNOSIS — I25119 Atherosclerotic heart disease of native coronary artery with unspecified angina pectoris: Secondary | ICD-10-CM

## 2015-05-16 DIAGNOSIS — I9589 Other hypotension: Secondary | ICD-10-CM

## 2015-05-16 DIAGNOSIS — Z9889 Other specified postprocedural states: Secondary | ICD-10-CM

## 2015-05-16 DIAGNOSIS — I255 Ischemic cardiomyopathy: Secondary | ICD-10-CM

## 2015-05-16 DIAGNOSIS — Z720 Tobacco use: Secondary | ICD-10-CM

## 2015-05-16 NOTE — Patient Instructions (Addendum)
Medication Instructions:  Your physician has recommended you make the following change in your medication:  1) STOP taking coreg 3.125mg  (1 tablet) twice a day 2) START taking coreg 0.5 tablet twice per day  Monitor your blood pressure and if it is consistently [id number] systolically, call our office   Labwork: Your physician recommends that you have fasting lipid and liver profile today   Testing/Procedures: None  Follow-Up: Your physician recommends that you schedule a follow-up appointment in: one month with Dr. Fletcher Anon or Christell Faith, PA   Any Other Special Instructions Will Be Listed Below (If Applicable).

## 2015-05-16 NOTE — Progress Notes (Signed)
Patient Name: [name] [name] Date of Encounter: 05/16/2015  Primary Care Provider:  Lorelee Market, MD Primary Cardiologist:  Jerilynn Mages. Fletcher Anon, MD   Chief Complaint  34 y/o female s/p NSTEMI in 03/2015 who presents following more recent hospitalization for c/p w/ patent stents on cath.  Past Medical History   Past Medical History  Diagnosis Date  . CAD (coronary artery disease)     a. NSTEMI 03/2015: mLCx 100% s/p PCI/DES, RI 90% s/p PCI/DES, EF 35%; b. 04/2015 Relook Cath: LM nl, LAD 40p, RI patent stent, LCX patent stent, RCA nl, EF 55-65%.  . Chronic systolic CHF (congestive heart failure)     a. echo 03/2015: EF 30-35%, mild concentric LVH, severe HK of inf, inflat, & lat walls, mod MR, mild TR  . Ischemic cardiomyopathy   . DM2 (diabetes mellitus, type 2)     a. since 2002  . Tobacco abuse   . Obesity   . Hyperlipidemia    Past Surgical History  Procedure Laterality Date  . Mouth surgery    . Cardiac catheterization  4/16    x2 stent [facility]  . Cardiac catheterization N/A 05/05/2015    Procedure: Left Heart Cath and Coronary Angiography;  Surgeon: Peter M Martinique, MD;  Location: [facility] [facility];  Service: Cardiovascular;  Laterality: N/A;   Allergies  Allergies  Allergen Reactions  . Lisinopril Swelling  . Shellfish Allergy Swelling   HPI  34 y/o female with the above problem list.  She is s/p NSTEMI in 03/2015 with severe LCX and RI dzs s/p DES' to both locations.  EF @ that time was 30-35%.  She was d/c'd on coreg and losartan.  She was readmitted to [facility] in late May with c/p and r/o for MI.  Cath revealed patent LCX and RI stents.  EF had improved to 50-55% by LV gram.  She was subsequently discharged.  Since d/c, she has noted intermittent chest discomfort, generally occurring @ rest and resolving either spontaneously or w/in a few mins after taking a sl NTG.  Her biggest complaint however is that of low blood pressures and intermittent lightheadedness.  She checks her BP  frequently @ home and notes that it is often in the 80's.  She was seen by her PCP last week and losartan was discontinued.  She remains on low dose coreg.  BP in the office today is 90/60.  She denies palpitations, dyspnea, pnd, orthopnea, n, v, syncope, edema, weight gain, or early satiety.   Home Medications  Prior to Admission medications   Medication Sig Start Date End Date Taking? Authorizing Provider  aspirin 81 MG tablet Take 81 mg by mouth daily.   Yes Historical Provider, MD  atorvastatin (LIPITOR) 40 MG tablet Take 40 mg by mouth daily.   Yes Historical Provider, MD  carvedilol (COREG) 3.125 MG tablet Take 3.125 mg by mouth 2 (two) times daily with a meal.   Yes Historical Provider, MD  Empagliflozin-Metformin HCl (SYNJARDY PO) Take 12.5-1,000 mg by mouth 2 (two) times daily.    Yes Historical Provider, MD  Fluticasone Furoate-Vilanterol (BREO ELLIPTA IN) Inhale into the lungs as needed.   Yes Historical Provider, MD  nitroGLYCERIN (NITROSTAT) 0.4 MG SL tablet Place 0.4 mg under the tongue every 5 (five) minutes as needed for chest pain.   Yes Historical Provider, MD  ticagrelor (BRILINTA) 90 MG TABS tablet Take 90 mg by mouth 2 (two) times daily.   Yes Historical Provider, MD    Review  of Systems  Intermittent chest pain and lightheadedness as above.  All other systems reviewed and are otherwise negative except as noted above.  Physical Exam  VS:  BP 90/60 mmHg  Pulse 94  Ht 5\' 4"  (1.626 m)  Wt 169 lb 8 oz (76.885 kg)  BMI 29.08 kg/m2  LMP 04/27/2015 , BMI Body mass index is 29.08 kg/(m^2). GEN: Well nourished, well developed, in no acute distress. HEENT: normal. Neck: Supple, no JVD, carotid bruits, or masses. Cardiac: RRR, no murmurs, rubs, or gallops. No clubbing, cyanosis, edema.  Radials/DP/PT 2+ and equal bilaterally.  R radial cath site w/o bleeding/bruit/hematoma. Respiratory:  Respirations regular and unlabored, clear to auscultation bilaterally. GI: Soft,  nontender, nondistended, BS + x 4. MS: no deformity or atrophy. Skin: warm and dry, no rash. Neuro:  Strength and sensation are intact. Psych: Normal affect.  Accessory Clinical Findings  ECG - rsr, 94, TWI in I and aVL. Resolution of prior inf twi.  Assessment & Plan  1.  CAD:  S/p NSTEMI with LCX and Ramus DES in 03/2015.  Relook cath 2/2 recurrent c/p in late May revealing patent stents in both locations.  She continues to have intermittent chest discomfort, which has been nitrate responsive.  Interestingly, this occurs mostly at rest and does not necessarily limit activity.  She remains on ASA, brilinta, bb, and statin therapy.  I'd like to add long acting nitrate however low BP's prohibit this.  We may need to consider ranexa, but cost will be an issue.  She isn't sure if she will be able to continue to afford her medications unless she is approved for medicaid (pending).  Her PCP Rx her cheaper alternatives including simvastatin and plavix, however she isn't sure that she'll be able to afford them either.  We have given her 2 mos worth of brilinta 90mg  samples today, to be taken BID as Rx.  She still has atorvastatin and has been taking it.  She will price out simvastatin - as this should be a $4 medicine.  Due to ongoing symptomatic hypotension, I am reducing her coreg to 1.5625 mg BID.  She will follow her BP's at home and report to [place] w/in the week if she continues to have BP's less than 100 and/or lightheadedness.  I suspect that we may ultimately need to d/c coreg unfortunately.  She is not currently interested in outpt cardiac rehab.  2.  Hypotension:  See above.  She has been having BP's in the 80's associated with lightheadedness.  I am reducing her coreg today and she will call within the week to let us know how her pressures are running @ home.  3.  ICM:  EF now normalized by recent LV gram. Euvolemic on exam.  ARB d/c'd 2/2 hypotension.  Coreg being titrated down for the same reason.   She does not require outpt diuretics.  4.  Tob Abuse:  Quit 03/29/2015.  I congratulated her on this and advised continued cessation.  She is trying to get her fiance to quit as well.  5.  HL:  LDL was 92 in April.  She remains on lipitor and will price out simva.  She is due for repeat lipids today and is fasting.  LFT's wnl on 5/27.  6.  Dispo:  Pt to take bp's and call us this week.  F/U in 1 month with Dr. Fletcher Anon or R. Dunn, PA-C.    Murray Hodgkins, NP 05/16/2015, 10:35 AM

## 2015-05-17 LAB — LIPID PANEL
Chol/HDL Ratio: 2.9 ratio units (ref 0.0–4.4)
Cholesterol, Total: 93 mg/dL — ABNORMAL LOW (ref 100–199)
HDL: 32 mg/dL — ABNORMAL LOW (ref >39)
LDL Calculated: 46 mg/dL (ref 0–99)
Triglycerides: 73 mg/dL (ref 0–149)
VLDL Cholesterol Cal: 15 mg/dL (ref 5–40)

## 2015-05-17 LAB — HEPATIC FUNCTION PANEL: Bilirubin, Direct: 0.08 mg/dL (ref 0.00–0.40)

## 2015-05-22 ENCOUNTER — Telehealth: Payer: Self-pay | Admitting: *Deleted

## 2015-05-22 NOTE — Telephone Encounter (Signed)
Pt states she has been taking 1/2 of 3.125mg  tablet as ordered by Ignacia Bayley, NP during 05/16/15 visit She reports the following blood pressure readings (see below) Was instructed to call office is BP consistently <100. Will forward to Dr. Fletcher Anon

## 2015-05-22 NOTE — Telephone Encounter (Signed)
Stop taking carvedilol.

## 2015-05-22 NOTE — Telephone Encounter (Signed)
S/w patient with Dr. Tyrell Antonio recommendations. Pt states she will stop taking carvedilol. Pt had no further questions.

## 2015-05-22 NOTE — Telephone Encounter (Signed)
Pt is calling just letting us know that her bp is still running lower than 100.  She said she was told to call us if they did not change.  05/17/15: 89/68 05/18/15 94/72 05/19/15 105/77 05/20/15 85/65 05/21/15 89/73 05/22/15 99/73  Would like to know what to do now, since it is still not going up. Please advise.

## 2015-06-14 ENCOUNTER — Telehealth: Payer: Self-pay | Admitting: Cardiovascular Disease

## 2015-06-14 NOTE — Telephone Encounter (Signed)
Patient is also gets lightheaded and sweats.

## 2015-06-14 NOTE — Telephone Encounter (Signed)
Pt c/o Shortness Of Breath: STAT if SOB developed within the last 24 hours or pt is noticeably SOB on the phone  1. Are you currently SOB (can you hear that pt is SOB on the phone)? no  2. How long have you been experiencing SOB? On going issue if walking longer distance   3. Are you SOB when sitting or when up moving around? Moving around mostly sometimes when sitting   4. Are you currently experiencing any other symptoms?   Numbess  Tingling down left arm and pain between shoulder blades

## 2015-06-14 NOTE — Telephone Encounter (Signed)
Spoke w/ pt.  She reports pain, numbness & tingling in her left arm and a dull, constant ache b/t her shoulders since yesterday.  Reports SOBOE but not at rest. Reports there she feels lightheaded & sweaty, checks her BP when symptomatic and it is up & down: ranging 90s/60s to 140s/90s. She reports that she lies down when her BP is low and she is symptomatic.  States that she is trying to stay hydrated, but concerned that she is not urinating as much.  Advised pt to keep her appt tomorrow @ 9:45 w/ Dr. Fletcher Anon and make sure she brings her BP monitor w/ her.

## 2015-06-15 ENCOUNTER — Ambulatory Visit (INDEPENDENT_AMBULATORY_CARE_PROVIDER_SITE_OTHER): Payer: Medicaid Other | Admitting: Cardiovascular Disease

## 2015-06-15 ENCOUNTER — Encounter: Payer: Self-pay | Admitting: Cardiovascular Disease

## 2015-06-15 VITALS — BP 120/74 | HR 101 | Ht 64.0 in | Wt 167.0 lb

## 2015-06-15 DIAGNOSIS — I5022 Chronic systolic (congestive) heart failure: Secondary | ICD-10-CM

## 2015-06-15 DIAGNOSIS — R0602 Shortness of breath: Secondary | ICD-10-CM

## 2015-06-15 DIAGNOSIS — E785 Hyperlipidemia, unspecified: Secondary | ICD-10-CM

## 2015-06-15 DIAGNOSIS — I25118 Atherosclerotic heart disease of native coronary artery with other forms of angina pectoris: Secondary | ICD-10-CM

## 2015-06-15 NOTE — Patient Instructions (Signed)
Medication Instructions: Continue same medications.   Labwork: None.   Procedures/Testing: None.   Follow-Up: 3 months with Dr. Shakela Donati.   Any Additional Special Instructions Will Be Listed Below (If Applicable).   

## 2015-06-15 NOTE — Progress Notes (Signed)
HPI  This is a 34 year old African American female who is here today for a follow-up visit regarding coronary artery disease and chronic systolic heart failure.  She has prolonged history of type 2 diabetes since 2002 , prolonged history of tobacco use, and obesity.  She presented to [facility] in April of this year with shortness of breath, and substernal chest tightness and lightheadedness. She started feeling unusual about 2 days prior to presentation with a headache, fever, and possible cold. She was noted to have mildly elevated troponin. EKG showed no acute ST or T wave changes. She was noted to have sinus tachycardia with evidence of pulmonary edema on chest x-ray. She was hypoxic, but improved with diuresis.  Echocardiogram showed an ejection fraction of 30% with severe inferior, inferolateral and lateral wall hypokinesis with moderate mitral regurgitation. I proceeded with cardiac catheterization  which showed occluded mid left circumflex with significant disease affecting the proximal OM1. There was mild disease affecting the LAD and minor irregularities in the RCA. Ejection fraction was 35%. I performed successful angioplasty and drug-eluting stent placement to the mid LCX and proximal OM without complications.  She was rehospitalized at [facility] in May for intermittent chest pain. She underwent cardiac catheterization which showed patent stents in the left circumflex and OM1. Ejection fraction was 50-55%. She had intermittent low blood pressure since then and thus losartan was discontinued. Carvedilol was also discontinued for the same reason. She continues to complain of intermittent episodes of dizziness and feeling fatigued. She has not started any exercise. Medicaid insurance is still pending.    Allergies  Allergen Reactions  . Lisinopril Swelling  . Shellfish Allergy Swelling     Current Outpatient Prescriptions on File Prior to Visit  Medication Sig Dispense Refill  . aspirin 81 MG  tablet Take 81 mg by mouth daily.    . Empagliflozin-Metformin HCl (SYNJARDY PO) Take 12.5-1,000 mg by mouth 2 (two) times daily.     . Fluticasone Furoate-Vilanterol (BREO ELLIPTA IN) Inhale into the lungs as needed.    . nitroGLYCERIN (NITROSTAT) 0.4 MG SL tablet Place 0.4 mg under the tongue every 5 (five) minutes as needed for chest pain.    . ticagrelor (BRILINTA) 90 MG TABS tablet Take 90 mg by mouth 2 (two) times daily.     No current facility-administered medications on file prior to visit.     Past Medical History  Diagnosis Date  . CAD (coronary artery disease)     a. NSTEMI 03/2015: mLCx 100% s/p PCI/DES, RI 90% s/p PCI/DES, EF 35%; b. 04/2015 Relook Cath: LM nl, LAD 40p, RI patent stent, LCX patent stent, RCA nl, EF 55-65%.  . Chronic systolic CHF (congestive heart failure)     a. echo 03/2015: EF 30-35%, mild concentric LVH, severe HK of inf, inflat, & lat walls, mod MR, mild TR;  b. 04/2015 LV Gram: EF 55-65%.  . Ischemic cardiomyopathy     a. 03/2015 EF 30-35% post NSTEMI;  b. 04/2015 EF 55-65% on LV gram.  . DM2 (diabetes mellitus, type 2)     a. since 2002  . Tobacco abuse     a. quit 03/2015.  . Obesity   . Hyperlipidemia   . Hypotension     a. resulting in discontinuation of losartan and reduction in coreg dose.     Past Surgical History  Procedure Laterality Date  . Mouth surgery    . Cardiac catheterization  4/16    x2 stent [facility]  . Cardiac  catheterization N/A 05/05/2015    Procedure: Left Heart Cath and Coronary Angiography;  Surgeon: Peter M Martinique, MD;  Location: [facility];  Service: Cardiovascular;  Laterality: N/A;     Family History  Problem Relation Age of Onset  . Family history unknown: Yes     History   Social History  . Marital Status: Single    Spouse Name: N/A  . Number of Children: N/A  . Years of Education: N/A   Occupational History  . Not on file.   Social History Main Topics  . Smoking status: Former Research scientist (life sciences)  . Smokeless  tobacco: Not on file  . Alcohol Use: No  . Drug Use: No  . Sexual Activity: Not on file   Other Topics Concern  . Not on file   Social History Narrative       PHYSICAL EXAM   BP 120/74 mmHg  Pulse 101  Ht 5\' 4"  (1.626 m)  Wt 167 lb (75.751 kg)  BMI 28.65 kg/m2  Constitutional: She is oriented to person, place, and time. She appears well-developed and well-nourished. No distress.  HENT: No nasal discharge.  Head: Normocephalic and atraumatic.  Eyes: Pupils are equal and round. No discharge.  Neck: Normal range of motion. Neck supple. No JVD present. No thyromegaly present.  Cardiovascular: Normal rate, regular rhythm, normal heart sounds. Exam reveals no gallop and no friction rub. No murmur heard.  Pulmonary/Chest: Effort normal and breath sounds normal. No stridor. No respiratory distress. She has no wheezes. She has no rales. She exhibits no tenderness.  Abdominal: Soft. Bowel sounds are normal. She exhibits no distension. There is no tenderness. There is no rebound and no guarding.  Musculoskeletal: Normal range of motion. She exhibits no edema and no tenderness.  Neurological: She is alert and oriented to person, place, and time. Coordination normal.  Skin: Skin is warm and dry. No rash noted. She is not diaphoretic. No erythema. No pallor.  Psychiatric: She has a normal mood and affect. Her behavior is normal. Judgment and thought content normal.    EKG: Sinus  Tachycardia  Low voltage in limb leads.   ABNORMAL     ASSESSMENT AND PLAN

## 2015-06-15 NOTE — Assessment & Plan Note (Signed)
Lab Results  Component Value Date   CHOL 93* 05/16/2015   HDL 32* 05/16/2015   LDLCALC 46 05/16/2015   TRIG 73 05/16/2015   CHOLHDL 2.9 05/16/2015   Continue treatment with simvastatin.

## 2015-06-15 NOTE — Assessment & Plan Note (Signed)
I think she is overall doing reasonably well from a cardiac standpoint. However, she continues to be deconditioned. I asked her to start an exercise program. Once she has Medicaid, I plan on referring her to cardiac rehabilitation.

## 2015-06-15 NOTE — Assessment & Plan Note (Signed)
Most recent ejection fraction actually improved to 50-55%. Both losartan and carvedilol were discontinued due to symptomatic hypotension. I might consider a small dose metoprolol if she continues to have sinus tachycardia.

## 2015-06-29 ENCOUNTER — Telehealth: Payer: Self-pay

## 2015-06-29 ENCOUNTER — Other Ambulatory Visit: Payer: Self-pay

## 2015-06-29 DIAGNOSIS — I509 Heart failure, unspecified: Secondary | ICD-10-CM

## 2015-06-29 NOTE — Telephone Encounter (Signed)
S/w pt to notify her that cardiac rehab orders have now been placed. Pt states she has Medicaid.   Per 7/7 MD notes: I think she is overall doing reasonably well from a cardiac standpoint. However, she continues to be deconditioned. I asked her to start an exercise program. Once she has Medicaid, I plan on referring her to cardiac rehabilitation.

## 2015-06-29 NOTE — Telephone Encounter (Signed)
Pt states her Medicaid is active now, and is ready to set up cardiac rehab

## 2015-07-10 ENCOUNTER — Telehealth: Payer: Self-pay

## 2015-07-10 NOTE — Telephone Encounter (Signed)
S/w pt who states she now has medicaide coverage. Provided pt number for Cardiac Rehab, [phone] Pt states she will call to get it set up.

## 2015-07-10 NOTE — Telephone Encounter (Signed)
S/w Kendall at Cardiac Rehab who took pt info and will check on start date.  Notified pt that I contacted cardiac rehab and they are checking into orders. Pt verbalized understanding and will notify us if she has not heard from Rehab in 1-2 days.

## 2015-07-10 NOTE — Telephone Encounter (Signed)
Pt has not heard anything regarding her cardiac rehab. Please call.

## 2015-07-22 ENCOUNTER — Observation Stay
Admission: EM | Admit: 2015-07-22 | Discharge: 2015-07-23 | Disposition: A | Discharge status: Home / Self Care | Payer: Medicaid Other | Attending: Internal Medicine | Admitting: Internal Medicine

## 2015-07-22 ENCOUNTER — Encounter: Payer: Self-pay | Admitting: Emergency Medicine

## 2015-07-22 DIAGNOSIS — R42 Dizziness and giddiness: Secondary | ICD-10-CM | POA: Diagnosis not present

## 2015-07-22 DIAGNOSIS — R072 Precordial pain: Secondary | ICD-10-CM | POA: Diagnosis not present

## 2015-07-22 DIAGNOSIS — R0602 Shortness of breath: Secondary | ICD-10-CM | POA: Insufficient documentation

## 2015-07-22 DIAGNOSIS — I255 Ischemic cardiomyopathy: Secondary | ICD-10-CM | POA: Diagnosis not present

## 2015-07-22 DIAGNOSIS — R110 Nausea: Secondary | ICD-10-CM | POA: Diagnosis not present

## 2015-07-22 DIAGNOSIS — Z87891 Personal history of nicotine dependence: Secondary | ICD-10-CM | POA: Insufficient documentation

## 2015-07-22 DIAGNOSIS — R079 Chest pain, unspecified: Principal | ICD-10-CM | POA: Insufficient documentation

## 2015-07-22 DIAGNOSIS — Z955 Presence of coronary angioplasty implant and graft: Secondary | ICD-10-CM | POA: Diagnosis not present

## 2015-07-22 DIAGNOSIS — R200 Anesthesia of skin: Secondary | ICD-10-CM | POA: Insufficient documentation

## 2015-07-22 DIAGNOSIS — E119 Type 2 diabetes mellitus without complications: Secondary | ICD-10-CM | POA: Insufficient documentation

## 2015-07-22 DIAGNOSIS — I5022 Chronic systolic (congestive) heart failure: Secondary | ICD-10-CM | POA: Insufficient documentation

## 2015-07-22 DIAGNOSIS — Z79899 Other long term (current) drug therapy: Secondary | ICD-10-CM | POA: Insufficient documentation

## 2015-07-22 DIAGNOSIS — Z6828 Body mass index (BMI) 28.0-28.9, adult: Secondary | ICD-10-CM | POA: Diagnosis not present

## 2015-07-22 DIAGNOSIS — E785 Hyperlipidemia, unspecified: Secondary | ICD-10-CM | POA: Insufficient documentation

## 2015-07-22 DIAGNOSIS — R197 Diarrhea, unspecified: Secondary | ICD-10-CM | POA: Insufficient documentation

## 2015-07-22 DIAGNOSIS — Z7982 Long term (current) use of aspirin: Secondary | ICD-10-CM | POA: Diagnosis not present

## 2015-07-22 DIAGNOSIS — E669 Obesity, unspecified: Secondary | ICD-10-CM | POA: Insufficient documentation

## 2015-07-22 DIAGNOSIS — I10 Essential (primary) hypertension: Secondary | ICD-10-CM | POA: Insufficient documentation

## 2015-07-22 DIAGNOSIS — I25111 Atherosclerotic heart disease of native coronary artery with angina pectoris with documented spasm: Secondary | ICD-10-CM

## 2015-07-22 DIAGNOSIS — I2510 Atherosclerotic heart disease of native coronary artery without angina pectoris: Secondary | ICD-10-CM | POA: Diagnosis not present

## 2015-07-22 DIAGNOSIS — Z7951 Long term (current) use of inhaled steroids: Secondary | ICD-10-CM | POA: Diagnosis not present

## 2015-07-22 DIAGNOSIS — I252 Old myocardial infarction: Secondary | ICD-10-CM | POA: Insufficient documentation

## 2015-07-22 LAB — GLUCOSE, CAPILLARY: Glucose-Capillary: 141 mg/dL — ABNORMAL HIGH (ref 65–99)

## 2015-07-22 LAB — CBC WITH DIFFERENTIAL/PLATELET
Basophils Absolute: 0.1 10*3/uL (ref 0–0.1)
Basophils Relative: 1 %
Eosinophils Absolute: 0.5 10*3/uL (ref 0–0.7)
Eosinophils Relative: 7 %
HCT: 24.8 % — ABNORMAL LOW (ref 35.0–47.0)
Hemoglobin: 8.1 g/dL — ABNORMAL LOW (ref 12.0–16.0)
Lymphocytes Relative: 29 %
Lymphs Abs: 2 10*3/uL (ref 1.0–3.6)
MCH: 23.8 pg — ABNORMAL LOW (ref 26.0–34.0)
MCHC: 32.7 g/dL (ref 32.0–36.0)
MCV: 72.7 fL — ABNORMAL LOW (ref 80.0–100.0)
Monocytes Absolute: 0.4 10*3/uL (ref 0.2–0.9)
Monocytes Relative: 6 %
Neutro Abs: 3.9 10*3/uL (ref 1.4–6.5)
Neutrophils Relative %: 57 %
Platelets: 419 10*3/uL (ref 150–440)
RBC: 3.41 MIL/uL — ABNORMAL LOW (ref 3.80–5.20)
RDW: 18.2 % — ABNORMAL HIGH (ref 11.5–14.5)
WBC: 6.8 10*3/uL (ref 3.6–11.0)

## 2015-07-22 LAB — BASIC METABOLIC PANEL
Anion gap: 9 (ref 5–15)
BUN: 20 mg/dL (ref 6–20)
CO2: 22 mmol/L (ref 22–32)
Calcium: 8.9 mg/dL (ref 8.9–10.3)
Chloride: 107 mmol/L (ref 101–111)
Creatinine, Ser: 1.01 mg/dL — ABNORMAL HIGH (ref 0.44–1.00)
GFR calc Af Amer: >60 mL/min (ref >60)
GFR calc non Af Amer: >60 mL/min (ref >60)
Glucose, Bld: 146 mg/dL — ABNORMAL HIGH (ref 65–99)
Potassium: 3.8 mmol/L (ref 3.5–5.1)
Sodium: 138 mmol/L (ref 135–145)

## 2015-07-22 LAB — TROPONIN I
Troponin I: <0.03 ng/mL (ref <0.031)
Troponin I: <0.03 ng/mL (ref <0.031)
Troponin I: <0.03 ng/mL (ref <0.031)

## 2015-07-22 MED ORDER — METOPROLOL TARTRATE 25 MG PO TABS
12.5000 mg | ORAL_TABLET | Freq: Two times a day (BID) | ORAL | Status: DC
  Administered 2015-07-22 (×2): 12.5 mg via ORAL
  Filled 2015-07-22 (×2): qty 1

## 2015-07-22 MED ORDER — ASPIRIN EC 81 MG PO TBEC
81.0000 mg | DELAYED_RELEASE_TABLET | Freq: Every day | ORAL | Status: DC
Start: 2015-07-22 — End: 2015-07-23
  Administered 2015-07-22 – 2015-07-23 (×2): 81 mg via ORAL
  Filled 2015-07-22 (×2): qty 1

## 2015-07-22 MED ORDER — NITROGLYCERIN 0.1 MG/HR TD PT24
0.1000 mg | MEDICATED_PATCH | Freq: Every day | TRANSDERMAL | Status: DC
  Administered 2015-07-22: 0.1 mg via TRANSDERMAL
  Filled 2015-07-22 (×2): qty 1

## 2015-07-22 MED ORDER — ACETAMINOPHEN 325 MG PO TABS
650.0000 mg | ORAL_TABLET | Freq: Four times a day (QID) | ORAL | Status: DC | PRN
  Administered 2015-07-22 (×2): 650 mg via ORAL
  Filled 2015-07-22 (×2): qty 2

## 2015-07-22 MED ORDER — ENOXAPARIN SODIUM 40 MG/0.4ML ~~LOC~~ SOLN
40.0000 mg | SUBCUTANEOUS | Status: DC
  Administered 2015-07-22 – 2015-07-23 (×2): 40 mg via SUBCUTANEOUS
  Filled 2015-07-22 (×2): qty 0.4

## 2015-07-22 MED ORDER — SIMVASTATIN 20 MG PO TABS
20.0000 mg | ORAL_TABLET | Freq: Every day | ORAL | Status: DC
  Administered 2015-07-22: 20 mg via ORAL
  Filled 2015-07-22 (×3): qty 1

## 2015-07-22 MED ORDER — INSULIN ASPART 100 UNIT/ML ~~LOC~~ SOLN: 0.0000 [IU] | Freq: Three times a day (TID) | SUBCUTANEOUS | Status: DC

## 2015-07-22 MED ORDER — GLIMEPIRIDE 2 MG PO TABS
4.0000 mg | ORAL_TABLET | Freq: Every day | ORAL | Status: DC
  Administered 2015-07-23: 4 mg via ORAL
  Filled 2015-07-22: qty 2

## 2015-07-22 MED ORDER — TICAGRELOR 90 MG PO TABS
90.0000 mg | ORAL_TABLET | Freq: Two times a day (BID) | ORAL | Status: DC
  Administered 2015-07-22 – 2015-07-23 (×3): 90 mg via ORAL
  Filled 2015-07-22 (×3): qty 1

## 2015-07-22 NOTE — ED Notes (Signed)
Patient states that this morning she has had some chest pain and shortness of breath. Patient also reports feeling dizzy, nauseated and diaphoretic. Patient states that she had 4 episodes of diarrhea this am.

## 2015-07-22 NOTE — H&P (Signed)
[facility] [place] [facility] [place] NAME: [name] [name]    MR#:  [id number]  DATE OF BIRTH:  Apr 10, 1981  DATE OF ADMISSION:  07/22/2015  PRIMARY CARE PHYSICIAN: Lorelee Market, MD   REQUESTING/REFERRING PHYSICIAN:Dr.Goodman  CHIEF COMPLAINT: Chest pain    Chief Complaint  Patient presents with  . Chest Pain  . Shortness of Breath  . Nausea  . Diarrhea  . Dizziness    HISTORY OF PRESENT ILLNESS:  Verbal Holsten  is a 34 y.o. female with a known history of hypertension, type 2 diabetes mellitus, CAD, stents placed in April by Dr. Fletcher Anon  comes in with chest pain. Patient noticed to have 4 episodes of diarrhea this morning associated nausea. Patient felt sweating so she checked her blood sugar it was 80. Noticed to have left-sided chest discomfort going to the left arm associated left arm numbness. Concerning her  Heart disease and chest pain patient called ambulance. In the ER patient troponins are negative. Chest pain is mainly midsternal and a pressure-like pain not radiate from the job. Did have some nausea but no shortness of breath no cough no fever. Patient saw Dr. Fletcher Anon on July 7. She says her chest pain is in the middle of chest comes and goes. No aggravating or relieving factors. Regarding her diarrhea she says her daughter is very mass without any spell no abdominal pain. She thinks it's due to metformin. She started taking metformin since May and her diarrhea started after taking metformin. Denies a recent history of travel no recent history of sick contacts.  PAST MEDICAL HISTORY:   Past Medical History  Diagnosis Date  . CAD (coronary artery disease)     a. NSTEMI 03/2015: mLCx 100% s/p PCI/DES, RI 90% s/p PCI/DES, EF 35%; b. 04/2015 Relook Cath: LM nl, LAD 40p, RI patent stent, LCX patent stent, RCA nl, EF 55-65%.  . Chronic systolic CHF (congestive heart failure)     a. echo 03/2015: EF 30-35%, mild concentric LVH, severe HK of inf,  inflat, & lat walls, mod MR, mild TR;  b. 04/2015 LV Gram: EF 55-65%.  . Ischemic cardiomyopathy     a. 03/2015 EF 30-35% post NSTEMI;  b. 04/2015 EF 55-65% on LV gram.  . DM2 (diabetes mellitus, type 2)     a. since 2002  . Tobacco abuse     a. quit 03/2015.  . Obesity   . Hyperlipidemia   . Hypotension     a. resulting in discontinuation of losartan and reduction in coreg dose.  . MI (myocardial infarction)     PAST SURGICAL HISTOIRY:   Past Surgical History  Procedure Laterality Date  . Mouth surgery    . Cardiac catheterization  4/16    x2 stent [facility]  . Cardiac catheterization N/A 05/05/2015    Procedure: Left Heart Cath and Coronary Angiography;  Surgeon: Peter M Martinique, MD;  Location: [place] [facility];  Service: Cardiovascular;  Laterality: N/A;    SOCIAL HISTORY:   Social History  Substance Use Topics  . Smoking status: Former Research scientist (life sciences)  . Smokeless tobacco: Not on file  . Alcohol Use: No    FAMILY HISTORY:   Family History  Problem Relation Age of Onset  . Family history unknown: Yes    DRUG ALLERGIES:   Allergies  Allergen Reactions  . Lisinopril Swelling  . Shellfish Allergy Swelling    REVIEW OF SYSTEMS:  CONSTITUTIONAL: No fever, fatigue or weakness.  EYES: No  blurred or double vision.  EARS, NOSE, AND THROAT: No tinnitus or ear pain.  RESPIRATORY: No cough, shortness of breath, wheezing or hemoptysis.  CARDIOVASCULAR: No chest pain, orthopnea, edema.  GASTROINTESTINAL: No nausea, vomiting, diarrhea or abdominal pain.  GENITOURINARY: No dysuria, hematuria.  ENDOCRINE: No polyuria, nocturia,  HEMATOLOGY: No anemia, easy bruising or bleeding SKIN: No rash or lesion. MUSCULOSKELETAL: No joint pain or arthritis.   NEUROLOGIC: No tingling, numbness, weakness.  PSYCHIATRY: No anxiety or depression.   MEDICATIONS AT HOME:   Prior to Admission medications   Medication Sig Start Date End Date Taking? Authorizing Provider  aspirin EC 81 MG tablet Take  81 mg by mouth daily.   Yes Historical Provider, MD  Fluticasone Furoate-Vilanterol (BREO ELLIPTA IN) Inhale 1 puff into the lungs daily as needed.    Yes Historical Provider, MD  glimepiride (AMARYL) 4 MG tablet Take 4 mg by mouth daily with breakfast.   Yes Historical Provider, MD  metFORMIN (GLUCOPHAGE) 1000 MG tablet Take 1,000 mg by mouth 2 (two) times daily with a meal.   Yes Historical Provider, MD  nitroGLYCERIN (NITROSTAT) 0.4 MG SL tablet Place 0.4 mg under the tongue every 5 (five) minutes as needed for chest pain.   Yes Historical Provider, MD  simvastatin (ZOCOR) 20 MG tablet Take 20 mg by mouth daily.   Yes Historical Provider, MD  ticagrelor (BRILINTA) 90 MG TABS tablet Take 90 mg by mouth 2 (two) times daily.   Yes Historical Provider, MD      VITAL SIGNS:  Blood pressure 130/77, pulse 93, temperature 98.6 F (37 C), temperature source Oral, resp. rate 18, height 5\' 4"  (1.626 m), weight 75.796 kg (167 lb 1.6 oz), last menstrual period 07/10/2015, SpO2 100 %.  PHYSICAL EXAMINATION:  GENERAL:  34 y.o.-year-old patient lying in the bed with no acute distress.  EYES: Pupils equal, round, reactive to light and accommodation. No scleral icterus. Extraocular muscles intact.  HEENT: Head atraumatic, normocephalic. Oropharynx and nasopharynx clear.  NECK:  Supple, no jugular venous distention. No thyroid enlargement, no tenderness.  LUNGS: Normal breath sounds bilaterally, no wheezing, rales,rhonchi or crepitation. No use of accessory muscles of respiration.  CARDIOVASCULAR: S1, S2 normal. No murmurs, rubs, or gallops.  ABDOMEN: Soft, nontender, nondistended. Bowel sounds present. No organomegaly or mass.  EXTREMITIES: No pedal edema, cyanosis, or clubbing.  NEUROLOGIC: Cranial nerves II through XII are intact. Muscle strength 5/5 in all extremities. Sensation intact. Gait not checked.  PSYCHIATRIC: The patient is alert and oriented x 3.  SKIN: No obvious rash, lesion, or ulcer.    LABORATORY PANEL:   CBC  Recent Labs Lab 07/22/15 0735  WBC 6.8  HGB 8.1*  HCT 24.8*  PLT 419   ------------------------------------------------------------------------------------------------------------------  Chemistries   Recent Labs Lab 07/22/15 0735  NA 138  K 3.8  CL 107  CO2 22  GLUCOSE 146*  BUN 20  CREATININE 1.01*  CALCIUM 8.9   ------------------------------------------------------------------------------------------------------------------  Cardiac Enzymes  Recent Labs Lab 07/22/15 0735  TROPONINI <0.03   ------------------------------------------------------------------------------------------------------------------  RADIOLOGY:  No results found.  EKG:   Orders placed or performed in visit on 06/15/15  . EKG 12-Lead   EKG shows normal sinus rhythm at 96 bpm no ST-T changes. IMPRESSION AND PLAN:  #1 chest pain sounds atypical: But due to her the history of coronary artery disease and a recent stent placement monitor overnight on telemetry and cycle the troponins, continue aspirin, Brilinta, Nitropaste. If BP Allows  will start low-dose Lopressor.  Consult cardiology from [place] .  2. Type 2 diabetes mellitus: Continue Amaryl, hold metformin secondary to diarrhea. As sliding scale insulin with coverage. #3 nausea and diarrhea likely on metformin induced differential also includes possible viral the gastroenteritis. Continue Zofran, can start gentle hydration. Hold the metformin.  All the records are reviewed and case discussed with ED provider. Management plans discussed with the patient, family and they are in agreement.  CODE STATUS: full  TOTAL TIME TAKING CARE OF THIS PATIENT: 32minutes.    Epifanio Lesches M.D on 07/22/2015 at 1:08 PM  Between 7am to 6pm - Pager - [phone]  After 6pm go to [email or web address] - password [email or web address] [facility]  Office  [phone]  CC: Primary care physician; Lorelee Market, MD

## 2015-07-22 NOTE — Progress Notes (Signed)
Patient alert and oriented x4. Oriented to room, unit, and call bell. Admission completed. No complaints at this time. Will cont to assess. Skin assessment verified by Georgina Quint, RN. Telemetry box verified. Wilnette Kales

## 2015-07-22 NOTE — Consult Note (Signed)
CONSULT NOTE  Date: 07/22/2015               Patient Name:  [name] [name] MRN: [id number]  DOB: [birth year] Age / Sex: 34 y.o., female        PCP: [facility] Primary Cardiologist: Fletcher Anon            Referring Physician: Vianne Bulls              Reason for Consult: CP, dyspnea            History of Present Illness: Patient is a 34 y.o. female with a PMHx of coronary artery disease, hypertension, type 2 diabetes mellitus, who was admitted to [facility] on 07/22/2015 for evaluation of nausea and diarrhea. She also commented on some chest discomfort.  Chest pain was described as a constant chest discomfort. There was radiation to the left arm associated with left arm numbness.  She also been having diarrhea for the past several months which she associates with the metformin therapy.    She has a history of a non-ST segment elevation myocardial infarction in April of this year. She was found have an occluded left circumflex artery which was stented shows that had a 90% ramus intermediate vessel that was also stated. Relook cardiac catheter station in May, 2016 revealed patent stents. There was moderate disease in the LAD.    Medications: Outpatient medications: Prescriptions prior to admission  Medication Sig Dispense Refill Last Dose  . aspirin EC 81 MG tablet Take 81 mg by mouth daily.   07/21/2015 at Unknown time  . Fluticasone Furoate-Vilanterol (BREO ELLIPTA IN) Inhale 1 puff into the lungs daily as needed.    Past Month at Unknown time  . glimepiride (AMARYL) 4 MG tablet Take 4 mg by mouth daily with breakfast.   07/21/2015 at Unknown time  . metFORMIN (GLUCOPHAGE) 1000 MG tablet Take 1,000 mg by mouth 2 (two) times daily with a meal.   07/21/2015 at Unknown time  . nitroGLYCERIN (NITROSTAT) 0.4 MG SL tablet Place 0.4 mg under the tongue every 5 (five) minutes as needed for chest pain.   07/20/2015  . simvastatin (ZOCOR) 20 MG tablet Take 20 mg by mouth daily.   07/21/2015 at  Unknown time  . ticagrelor (BRILINTA) 90 MG TABS tablet Take 90 mg by mouth 2 (two) times daily.   07/21/2015 at Unknown time    Current medications: Current Facility-Administered Medications  Medication Dose Route Frequency Provider Last Rate Last Dose  . acetaminophen (TYLENOL) tablet 650 mg  650 mg Oral Q6H PRN Epifanio Lesches, MD   650 mg at 07/22/15 1209  . aspirin EC tablet 81 mg  81 mg Oral Daily Epifanio Lesches, MD   81 mg at 07/22/15 1209  . enoxaparin (LOVENOX) injection 40 mg  40 mg Subcutaneous Q24H Epifanio Lesches, MD   40 mg at 07/22/15 1208  . [START ON 07/23/2015] glimepiride (AMARYL) tablet 4 mg  4 mg Oral Q breakfast Epifanio Lesches, MD      . insulin aspart (novoLOG) injection 0-9 Units  0-9 Units Subcutaneous TID WC Epifanio Lesches, MD      . metoprolol tartrate (LOPRESSOR) tablet 12.5 mg  12.5 mg Oral BID Epifanio Lesches, MD      . nitroGLYCERIN (NITRODUR - Dosed in mg/24 hr) patch 0.1 mg  0.1 mg Transdermal Daily Epifanio Lesches, MD      . simvastatin (ZOCOR) tablet 20 mg  20 mg Oral Daily Epifanio Lesches, MD      .  ticagrelor (BRILINTA) tablet 90 mg  90 mg Oral BID Epifanio Lesches, MD   90 mg at 07/22/15 1210     Allergies  Allergen Reactions  . Lisinopril Swelling  . Shellfish Allergy Swelling     Past Medical History  Diagnosis Date  . CAD (coronary artery disease)     a. NSTEMI 03/2015: mLCx 100% s/p PCI/DES, RI 90% s/p PCI/DES, EF 35%; b. 04/2015 Relook Cath: LM nl, LAD 40p, RI patent stent, LCX patent stent, RCA nl, EF 55-65%.  . Chronic systolic CHF (congestive heart failure)     a. echo 03/2015: EF 30-35%, mild concentric LVH, severe HK of inf, inflat, & lat walls, mod MR, mild TR;  b. 04/2015 LV Gram: EF 55-65%.  . Ischemic cardiomyopathy     a. 03/2015 EF 30-35% post NSTEMI;  b. 04/2015 EF 55-65% on LV gram.  . DM2 (diabetes mellitus, type 2)     a. since 2002  . Tobacco abuse     a. quit 03/2015.  . Obesity   .  Hyperlipidemia   . Hypotension     a. resulting in discontinuation of losartan and reduction in coreg dose.  . MI (myocardial infarction)     Past Surgical History  Procedure Laterality Date  . Mouth surgery    . Cardiac catheterization  4/16    x2 stent [facility]  . Cardiac catheterization N/A 05/05/2015    Procedure: Left Heart Cath and Coronary Angiography;  Surgeon: Peter M Martinique, MD;  Location: [facility];  Service: Cardiovascular;  Laterality: N/A;    Family History  Problem Relation Age of Onset  . Family history unknown: Yes    Social History:  reports that she has quit smoking. She does not have any smokeless tobacco history on file. She reports that she does not drink alcohol or use illicit drugs.   Review of Systems: Constitutional:  denies fever, chills, diaphoresis, appetite change and fatigue.  HEENT: denies photophobia, eye pain, redness, hearing loss, ear pain, congestion, sore throat, rhinorrhea, sneezing, neck pain, neck stiffness and tinnitus.  Respiratory: denies SOB, DOE, cough, chest tightness, and wheezing.  Cardiovascular: admits to chest pain,    Gastrointestinal: admits to nausea,  abdominal pain, diarrhea,    Genitourinary: denies dysuria, urgency, frequency, hematuria, flank pain and difficulty urinating.  Musculoskeletal: denies  myalgias, back pain, joint swelling, arthralgias and gait problem.   Skin: denies pallor, rash and wound.  Neurological: denies dizziness, seizures, syncope, weakness, light-headedness, numbness and headaches.   Hematological: denies adenopathy, easy bruising, personal or family bleeding history.  Psychiatric/ Behavioral: denies suicidal ideation, mood changes, confusion, nervousness, sleep disturbance and agitation.    Physical Exam: BP 130/77 mmHg  Pulse 93  Temp(Src) 98.6 F (37 C) (Oral)  Resp 18  Ht 5\' 4"  (1.626 m)  Wt 75.796 kg (167 lb 1.6 oz)  BMI 28.67 kg/m2  SpO2 100%  LMP 07/10/2015 (Exact Date)  Wt  Readings from Last 3 Encounters:  07/22/15 75.796 kg (167 lb 1.6 oz)  06/15/15 75.751 kg (167 lb)  05/16/15 76.885 kg (169 lb 8 oz)    General: Vital signs reviewed and noted. Well-developed, well-nourished, in no acute distress; alert,   Head: Normocephalic, atraumatic, sclera anicteric,   Neck: Supple. Negative for carotid bruits. No JVD   Lungs:  Clear bilaterally, no  wheezes, rales, or rhonchi. Breathing is normal   Heart: RRR with S1 S2. No murmurs, rubs, or gallops   Abdomen/ GI :  Soft,  non-tender, non-distended with normoactive bowel sounds. No hepatomegaly. No rebound/guarding. No obvious abdominal masses   MSK: Strength and the appear normal for age.   Extremities: No clubbing or cyanosis. No edema.  Distal pedal pulses are 2+ and equal   Neurologic:  CN are grossly intact,  No obvious motor or sensory defect.  Alert and oriented X 3. Moves all extremities spontaneously.  Psych: Responds to questions appropriately with a normal affect.     Lab results: Basic Metabolic Panel:  Recent Labs Lab 07/22/15 0735  NA 138  K 3.8  CL 107  CO2 22  GLUCOSE 146*  BUN 20  CREATININE 1.01*  CALCIUM 8.9    Liver Function Tests: No results for input(s): AST, ALT, ALKPHOS, BILITOT, PROT, ALBUMIN in the last 168 hours. No results for input(s): LIPASE, AMYLASE in the last 168 hours. No results for input(s): AMMONIA in the last 168 hours.  CBC:  Recent Labs Lab 07/22/15 0735  WBC 6.8  NEUTROABS 3.9  HGB 8.1*  HCT 24.8*  MCV 72.7*  PLT 419    Cardiac Enzymes:  Recent Labs Lab 07/22/15 0735 07/22/15 1316  TROPONINI <0.03 <0.03    BNP: Invalid input(s): POCBNP  CBG: No results for input(s): GLUCAP in the last 168 hours.  Coagulation Studies: No results for input(s): LABPROT, INR in the last 72 hours.   Other results:  Personal review of EKG shows :  - NSR, no ST or T wave changes.    Imaging:  No results found.     Assessment & Plan:  1.  Chest  pain :   Atypical . She stated that these pains are not similar to her previous episodes of pain when she presented with a myocardial infarction in April. These pains are associated with nausea. Also associated with some left arm tingling and numbness.  She had a symptom that was fairly symptom this presentation in May and cardiac catheter station at that time revealed no changes in her since. She has mild/moderate disease in her left anterior descending artery.  I agree with placing cardiac enzymes. We will get an echocardiogram. For enzymes remain negative then I think we can proceed with stress Myoview study Sunday or Monday. If her enzymes turn positive then we should proceed with cardiac catheterization on Monday.  Continue with Brilinta and aspirin.    Thayer Headings, Brooke Bonito., MD, [facility] 07/22/2015, 3:00 PM Office - [phone] Pager [phone]

## 2015-07-22 NOTE — ED Provider Notes (Signed)
[facility] Emergency Department Provider Note    ____________________________________________  Time seen: 0805  I have reviewed the triage vital signs and the nursing notes.   HISTORY  Chief Complaint Chest Pain; Shortness of Breath; Nausea; Diarrhea; and Dizziness   History limited by: Not Limited   HPI [name] [name] is a 34 y.o. female who presents to the emergency department today after  episodes of chest pain. Patient states thatshe had her first episode roughly 6 hours ago. She states that she got up and started feeling dizzy and nauseous. She states this is of itself is not that unusual for her she then began to have sharp left-sided chest pain. She did feel like she had some associated shortness of breath. This episode past however she had another one roughly 3 hours ago. Patient does have a history of heart attack, but this did not reminder of her symptoms for her heart attack. She is seen by Dr. Fletcher Anon. She denies any recent fevers.     Past Medical History  Diagnosis Date  . CAD (coronary artery disease)     a. NSTEMI 03/2015: mLCx 100% s/p PCI/DES, RI 90% s/p PCI/DES, EF 35%; b. 04/2015 Relook Cath: LM nl, LAD 40p, RI patent stent, LCX patent stent, RCA nl, EF 55-65%.  . Chronic systolic CHF (congestive heart failure)     a. echo 03/2015: EF 30-35%, mild concentric LVH, severe HK of inf, inflat, & lat walls, mod MR, mild TR;  b. 04/2015 LV Gram: EF 55-65%.  . Ischemic cardiomyopathy     a. 03/2015 EF 30-35% post NSTEMI;  b. 04/2015 EF 55-65% on LV gram.  . DM2 (diabetes mellitus, type 2)     a. since 2002  . Tobacco abuse     a. quit 03/2015.  . Obesity   . Hyperlipidemia   . Hypotension     a. resulting in discontinuation of losartan and reduction in coreg dose.  . MI (myocardial infarction)     Patient Active Problem List   Diagnosis Date Noted  . Chest pain 05/05/2015  . CAD in native artery 05/05/2015  . Pain in the chest   .  Hyperlipidemia 04/13/2015  . CAD (coronary artery disease)   . Chronic systolic CHF (congestive heart failure)   . Ischemic cardiomyopathy   . DM2 (diabetes mellitus, type 2)   . Tobacco abuse   . Obesity     Past Surgical History  Procedure Laterality Date  . Mouth surgery    . Cardiac catheterization  4/16    x2 stent [facility]  . Cardiac catheterization N/A 05/05/2015    Procedure: Left Heart Cath and Coronary Angiography;  Surgeon: Peter M Martinique, MD;  Location: [facility];  Service: Cardiovascular;  Laterality: N/A;    Current Outpatient Rx  Name  Route  Sig  Dispense  Refill  . aspirin 81 MG tablet   Oral   Take 81 mg by mouth daily.         . Empagliflozin-Metformin HCl (SYNJARDY PO)   Oral   Take 12.5-1,000 mg by mouth 2 (two) times daily.          . Fluticasone Furoate-Vilanterol (BREO ELLIPTA IN)   Inhalation   Inhale into the lungs as needed.         . nitroGLYCERIN (NITROSTAT) 0.4 MG SL tablet   Sublingual   Place 0.4 mg under the tongue every 5 (five) minutes as needed for chest pain.         Marland Kitchen  simvastatin (ZOCOR) 20 MG tablet   Oral   Take 20 mg by mouth daily.         . ticagrelor (BRILINTA) 90 MG TABS tablet   Oral   Take 90 mg by mouth 2 (two) times daily.           Allergies Lisinopril and Shellfish allergy  Family History  Problem Relation Age of Onset  . Family history unknown: Yes    Social History Social History  Substance Use Topics  . Smoking status: Former Research scientist (life sciences)  . Smokeless tobacco: None  . Alcohol Use: No    Review of Systems  Constitutional: Negative for fever. Cardiovascular: Positive for chest pain. Respiratory: Positive for shortness of breath. Gastrointestinal: Negative for abdominal pain, vomiting and diarrhea. Genitourinary: Negative for dysuria. Musculoskeletal: Negative for back pain. Skin: Negative for rash. Neurological: Negative for headaches, focal weakness or numbness.  10-point ROS  otherwise negative.  ____________________________________________   PHYSICAL EXAM:  VITAL SIGNS: ED Triage Vitals  Enc Vitals Group     BP 07/22/15 0706 141/88 mmHg     Pulse Rate 07/22/15 0706 95     Resp 07/22/15 0706 18     Temp 07/22/15 0706 98.7 F (37.1 C)     Temp Source 07/22/15 0706 Oral     SpO2 07/22/15 0706 100 %     Weight --      Height --      Head Cir --      Peak Flow --      Pain Score 07/22/15 0702 0   Constitutional: Alert and oriented. Well appearing and in no distress. Eyes: Conjunctivae are normal. PERRL. Normal extraocular movements. ENT   Head: Normocephalic and atraumatic.   Nose: No congestion/rhinnorhea.   Mouth/Throat: Mucous membranes are moist.   Neck: No stridor. Hematological/Lymphatic/Immunilogical: No cervical lymphadenopathy. Cardiovascular: Normal rate, regular rhythm.  No murmurs, rubs, or gallops. Respiratory: Normal respiratory effort without tachypnea nor retractions. Breath sounds are clear and equal bilaterally. No wheezes/rales/rhonchi. Gastrointestinal: Soft and nontender. No distention. There is no CVA tenderness. Genitourinary: Deferred Musculoskeletal: Normal range of motion in all extremities. No joint effusions.  No lower extremity tenderness nor edema. Neurologic:  Normal speech and language. No gross focal neurologic deficits are appreciated. Speech is normal.  Skin:  Skin is warm, dry and intact. No rash noted. Psychiatric: Mood and affect are normal. Speech and behavior are normal. Patient exhibits appropriate insight and judgment.  ____________________________________________    LABS (pertinent positives/negatives)  Trop <0.03  ____________________________________________   EKG  Apolonio Schneiders, attending physician, personally viewed and interpreted this EKG  EKG Time: 0702 Rate: 96 Rhythm: NSR Axis: normal Intervals: qtc 464 QRS: narrow ST changes: no st  elevation  ____________________________________________    RADIOLOGY  None  ____________________________________________   PROCEDURES  Procedure(s) performed: None  Critical Care performed: No  ____________________________________________   INITIAL IMPRESSION / ASSESSMENT AND PLAN / ED COURSE  Pertinent labs & imaging results that were available during my care of the patient were reviewed by me and considered in my medical decision making (see chart for details).  Patient presents to the emergency department today after episode of chest pain. Patient is high risk for ACS given history of previous heart attacks. First troponin the emergency department was negative however given high risk will plan on admitting for chest pain rule out.  ____________________________________________   FINAL CLINICAL IMPRESSION(S) / ED DIAGNOSES  Final diagnoses:  Chest pain, unspecified chest pain type  Nance Pear, MD 07/23/15 [id number]

## 2015-07-23 DIAGNOSIS — I2510 Atherosclerotic heart disease of native coronary artery without angina pectoris: Secondary | ICD-10-CM | POA: Diagnosis not present

## 2015-07-23 DIAGNOSIS — R072 Precordial pain: Secondary | ICD-10-CM | POA: Diagnosis not present

## 2015-07-23 LAB — GLUCOSE, CAPILLARY
Glucose-Capillary: 125 mg/dL — ABNORMAL HIGH (ref 65–99)
Glucose-Capillary: 146 mg/dL — ABNORMAL HIGH (ref 65–99)

## 2015-07-23 NOTE — Progress Notes (Signed)
Pt has not reported any pain. Family at the bedside. Takes meds ok. A & O. IV and tele removed. Discharge instructions given to pt. Pt has no further concerns at this time.

## 2015-07-23 NOTE — Progress Notes (Signed)
Rested quietly through the night with no issues. Medicated once for pain with tylenol. Family at side. A&O. Independent

## 2015-07-23 NOTE — Progress Notes (Signed)
PROGRESS NOTE  Subjective:    34 y.o. female with a PMHx of coronary artery disease, hypertension, type 2 diabetes mellitus, who was admitted to [facility] on 07/22/2015 for evaluation of nausea and diarrhea. She also commented on some chest discomfort.  She has felt well since yesterday .  as soon has her abdomin quieted down her CP resolved  Has ambulated in the room without any trouble .     Objective:    Vital Signs:   Temp:  [98.3 F (36.8 C)-98.7 F (37.1 C)] 98.3 F (36.8 C) (08/14 1159) Pulse Rate:  [87-90] 90 (08/14 1159) Resp:  [14-16] 14 (08/14 1159) BP: (95-124)/(60-76) 95/62 mmHg (08/14 1159) SpO2:  [100 %] 100 % (08/14 1159)  Last BM Date: 07/22/15   24-hour weight change: Weight change:   Weight trends: Filed Weights   07/22/15 0731 07/22/15 1100  Weight: 76.522 kg (168 lb 11.2 oz) 75.796 kg (167 lb 1.6 oz)    Intake/Output:  08/13 0701 - 08/14 0700 In: 240 [P.O.:240] Out: 420 [Urine:420] Total I/O In: 240 [P.O.:240] Out: 600 [Urine:600]   Physical Exam: BP 95/62 mmHg  Pulse 90  Temp(Src) 98.3 F (36.8 C) (Oral)  Resp 14  Ht 5\' 4"  (1.626 m)  Wt 75.796 kg (167 lb 1.6 oz)  BMI 28.67 kg/m2  SpO2 100%  LMP 07/10/2015 (Exact Date)  Wt Readings from Last 3 Encounters:  07/22/15 75.796 kg (167 lb 1.6 oz)  06/15/15 75.751 kg (167 lb)  05/16/15 76.885 kg (169 lb 8 oz)    General: Vital signs reviewed and noted. In NAD   Head: Normocephalic, atraumatic.  Eyes: conjunctivae/corneas clear.  EOM's intact.   Throat: normal  Neck:  normal   Lungs:    clear   Heart:  RR , normal s1s2  Abdomen:  Soft, non-tender, non-distended    Extremities:  no edema   Neurologic: A&O X3, CN II - XII are grossly intact.   Psych: Normal     Labs: BMET:  Recent Labs  07/22/15 0735  NA 138  K 3.8  CL 107  CO2 22  GLUCOSE 146*  BUN 20  CREATININE 1.01*  CALCIUM 8.9    Liver function tests: No results for input(s): AST, ALT, ALKPHOS, BILITOT,  PROT, ALBUMIN in the last 72 hours. No results for input(s): LIPASE, AMYLASE in the last 72 hours.  CBC:  Recent Labs  07/22/15 0735  WBC 6.8  NEUTROABS 3.9  HGB 8.1*  HCT 24.8*  MCV 72.7*  PLT 419    Cardiac Enzymes:  Recent Labs  07/22/15 0735 07/22/15 1316 07/22/15 1915  TROPONINI <0.03 <0.03 <0.03    Coagulation Studies: No results for input(s): LABPROT, INR in the last 72 hours.  Other: Invalid input(s): POCBNP No results for input(s): DDIMER in the last 72 hours. No results for input(s): HGBA1C in the last 72 hours. No results for input(s): CHOL, HDL, LDLCALC, TRIG, CHOLHDL in the last 72 hours. No results for input(s): TSH, T4TOTAL, T3FREE, THYROIDAB in the last 72 hours.  Invalid input(s): FREET3 No results for input(s): VITAMINB12, FOLATE, FERRITIN, TIBC, IRON, RETICCTPCT in the last 72 hours.   Other results:  Tele   ( personally reviewed )  - tele :: NSR    Medications:    Infusions:    Scheduled Medications: . aspirin EC  81 mg Oral Daily  . enoxaparin (LOVENOX) injection  40 mg Subcutaneous Q24H  . glimepiride  4 mg Oral Q breakfast  .  insulin aspart  0-9 Units Subcutaneous TID WC  . metoprolol tartrate  12.5 mg Oral BID  . nitroGLYCERIN  0.1 mg Transdermal Daily  . simvastatin  20 mg Oral Daily  . ticagrelor  90 mg Oral BID    Assessment/ Plan:   Active Problems:   Chest pain  1. CHest pain :  Troponin levels are negative.  She thinks her pain is more GI related and really does not feel like her previous episodes of unstable angina. At this point, I think she could go home today and follow up with Dr. Fletcher Anon in the office.  We can get the stress myoview as an OP if she has any further pains.   Would have her ambulate around the halls prior to DC    Disposition: OK to be discharged from my standpoint.   Length of Stay:   Ramond Dial., MD, [facility] 07/23/2015, 12:24 PM Office [phone] Pager [phone]

## 2015-07-24 ENCOUNTER — Encounter: Payer: Medicaid Other | Attending: Cardiovascular Disease | Admitting: *Deleted

## 2015-07-24 VITALS — Ht 65.8 in | Wt 169.1 lb

## 2015-07-24 DIAGNOSIS — I252 Old myocardial infarction: Secondary | ICD-10-CM | POA: Diagnosis present

## 2015-07-24 DIAGNOSIS — Z95820 Peripheral vascular angioplasty status with implants and grafts: Secondary | ICD-10-CM

## 2015-07-24 LAB — GLUCOSE, CAPILLARY: Glucose-Capillary: 158 mg/dL — ABNORMAL HIGH (ref 65–99)

## 2015-07-24 NOTE — Patient Instructions (Signed)
Patient Instructions  Patient Details  Name: [name] [name] MRN: [id number] Date of Birth: [birth year] Referring Provider:  Wellington Hampshire, MD  Below are the personal goals you chose as well as exercise and nutrition goals. Our goal is to help you keep on track towards obtaining and maintaining your goals. We will be discussing your progress on these goals with you throughout the program.  Initial Exercise Prescription:     Initial Exercise Prescription - 07/24/15 1500    Date of Initial Exercise Prescription   Date 07/24/15   Bike   Level 1   Minutes 15   Recumbant Bike   Level 3   RPM 40   Watts 30   Minutes 15   NuStep   Level 3   Watts 40   Minutes 15   Arm Ergometer   Level 1   Watts 10   Minutes 15   Arm/Foot Ergometer   Level 2   Watts 15   Minutes 15   Cybex   Level 2   RPM 40   Minutes 15   Recumbant Elliptical   Level 1   RPM 30   Watts 20   Minutes 15   REL-XR   Level 3   Watts 30   Minutes 15   Prescription Details   Frequency (times per week) 3   Duration Progress to 30 minutes of continuous aerobic without signs/symptoms of physical distress   Intensity   THRR REST +  20   Ratings of Perceived Exertion 11-15   Progression Continue progressive overload as per policy without signs/symptoms or physical distress.   Resistance Training   Training Prescription Yes   Weight 2   Reps 10-12      Exercise Goals: Frequency: Be able to perform aerobic exercise three times per week working toward 3-5 days per week.  Intensity: Work with a perceived exertion of 11 (fairly light) - 15 (hard) as tolerated. Follow your new exercise prescription and watch for changes in prescription as you progress with the program. Changes will be reviewed with you when they are made.  Duration: You should be able to do 30 minutes of continuous aerobic exercise in addition to a 5 minute warm-up and a 5 minute cool-down routine.  Nutrition Goals: Your personal  nutrition goals will be established when you do your nutrition analysis with the dietician.  The following are nutrition guidelines to follow: Cholesterol < 200mg /day Sodium < 1500mg /day Fiber: Women under 50 yrs - 25 grams per day  Personal Goals:     Personal Goals and Risk Factors at Admission - 07/24/15 1939    Personal Goals and Risk Factors on Admission    Weight Management Yes   Intervention Learn and follow the exercise and diet guidelines while in the program. Utilize the nutrition and education classes to help gain knowledge of the diet and exercise expectations in the program   Increase Aerobic Exercise and Physical Activity Yes   Intervention While in program, learn and follow the exercise prescription taught. Start at a low level workload and increase workload after able to maintain previous level for 30 minutes. Increase time before increasing intensity.   Take Less Medication Yes   Intervention Learn your risk factors and begin the lifestyle modifications for risk factor control during your time in the program.   Understand more about Heart/Pulmonary Disease. Yes   Intervention While in program utilize professionals for any questions, and attend the education sessions. Great websites to use  are www.americanheart.org or www.lung.org for reliable information.   Diabetes Yes   Goal Blood glucose control identified by blood glucose values, HgbA1C. Participant verbalizes understanding of the signs/symptoms of hyper/hypo glycemia, proper foot care and importance of medication and nutrition plan for blood glucose control.   Intervention Provide nutrition & aerobic exercise along with prescribed medications to achieve blood glucose in normal ranges: Fasting 65-99 mg/dL   Hypertension Yes   Goal Participant will see blood pressure controlled within the values of 140/32mm/Hg or within value directed by their physician.   Intervention Provide nutrition & aerobic exercise along with  prescribed medications to achieve BP 140/90 or less.   Lipids Yes   Goal Cholesterol controlled with medications as prescribed, with individualized exercise RX and with personalized nutrition plan. Value goals: LDL < 70mg , HDL > 40mg . Participant states understanding of desired cholesterol values and following prescriptions.   Intervention Provide nutrition & aerobic exercise along with prescribed medications to achieve LDL 70mg , HDL >40mg .   Stress Yes   Goal To meet with psychosocial counselor for stress and relaxation information and guidance. To state understanding of performing relaxation techniques and or identifying personal stressors.   Intervention Provide education on types of stress, identifiying stressors, and ways to cope with stress. Provide demonstration and active practice of relaxation techniques.      Tobacco Use Initial Evaluation: History  Smoking status  . Former Smoker  Smokeless tobacco  . Not on file    Copy of goals given to participant.

## 2015-07-24 NOTE — Progress Notes (Signed)
Cardiac Individual Treatment Plan  Patient Details  Name: [name] [name] MRN: [id number] Date of Birth: 1981/06/02 Referring Provider:  Wellington Hampshire, MD  Initial Encounter Date: Date: 07/24/15  Visit Diagnosis: Status post non-ST elevation myocardial infarction (NSTEMI)  Patient's Home Medications on Admission:  Current outpatient prescriptions:  .  aspirin EC 81 MG tablet, Take 81 mg by mouth daily., Disp: , Rfl:  .  Fluticasone Furoate-Vilanterol (BREO ELLIPTA IN), Inhale 1 puff into the lungs daily as needed. , Disp: , Rfl:  .  glimepiride (AMARYL) 4 MG tablet, Take 4 mg by mouth daily with breakfast., Disp: , Rfl:  .  nitroGLYCERIN (NITROSTAT) 0.4 MG SL tablet, Place 0.4 mg under the tongue every 5 (five) minutes as needed for chest pain., Disp: , Rfl:  .  simvastatin (ZOCOR) 20 MG tablet, Take 20 mg by mouth daily., Disp: , Rfl:  .  ticagrelor (BRILINTA) 90 MG TABS tablet, Take 90 mg by mouth 2 (two) times daily., Disp: , Rfl:   Past Medical History: Past Medical History  Diagnosis Date  . CAD (coronary artery disease)     a. NSTEMI 03/2015: mLCx 100% s/p PCI/DES, RI 90% s/p PCI/DES, EF 35%; b. 04/2015 Relook Cath: LM nl, LAD 40p, RI patent stent, LCX patent stent, RCA nl, EF 55-65%.  . Chronic systolic CHF (congestive heart failure)     a. echo 03/2015: EF 30-35%, mild concentric LVH, severe HK of inf, inflat, & lat walls, mod MR, mild TR;  b. 04/2015 LV Gram: EF 55-65%.  . Ischemic cardiomyopathy     a. 03/2015 EF 30-35% post NSTEMI;  b. 04/2015 EF 55-65% on LV gram.  . DM2 (diabetes mellitus, type 2)     a. since 2002  . Tobacco abuse     a. quit 03/2015.  . Obesity   . Hyperlipidemia   . Hypotension     a. resulting in discontinuation of losartan and reduction in coreg dose.  . MI (myocardial infarction)     Tobacco Use: History  Smoking status  . Former Smoker  Smokeless tobacco  . Not on file    Labs: Recent Review Flowsheet Data    Labs for ITP Cardiac and  Pulmonary Rehab Latest Ref Rng 03/31/2015 05/05/2015 05/16/2015   Cholestrol 100 - 199 mg/dL 133 - 93(L)   LDLCALC 0 - 99 mg/dL 92 - 46   HDL >39 mg/dL 26(L) - 32(L)   Trlycerides 0 - 149 mg/dL 77 - 73   Hemoglobin A1c 4.8 - 5.6 % 14.5(H) 14.2(H) -         POCT Glucose      07/24/15 1406           POCT Blood Glucose   Pre-Exercise 158 mg/dL          Exercise Target Goals: Date: 07/24/15  Exercise Program Goal: Individual exercise prescription set with THRR, safety & activity barriers. Participant demonstrates ability to understand and report RPE using BORG scale, to self-measure pulse accurately, and to acknowledge the importance of the exercise prescription.  Exercise Prescription Goal: Starting with aerobic activity 30 plus minutes a day, 3 days per week for initial exercise prescription. Provide home exercise prescription and guidelines that participant acknowledges understanding prior to discharge.  Activity Barriers & Risk Stratification:     Activity Barriers & Risk Stratification - 07/24/15 1933    Activity Barriers & Risk Stratification   Activity Barriers None   Risk Stratification High      6  Minute Walk:     6 Minute Walk      07/24/15 1546       6 Minute Walk   Phase Initial     Distance 326 feet  NS test: 326 steps     Walk Time 6 minutes     Resting HR 116 bpm     Resting BP 100/70 mmHg     Max Ex. HR 111 bpm     Max Ex. BP 110/60 mmHg     RPE 7     Symptoms No        Initial Exercise Prescription:     Initial Exercise Prescription - 07/24/15 1500    Date of Initial Exercise Prescription   Date 07/24/15   Bike   Level 1   Minutes 15   Recumbant Bike   Level 3   RPM 40   Watts 30   Minutes 15   NuStep   Level 3   Watts 40   Minutes 15   Arm Ergometer   Level 1   Watts 10   Minutes 15   Arm/Foot Ergometer   Level 2   Watts 15   Minutes 15   Cybex   Level 2   RPM 40   Minutes 15   Recumbant Elliptical   Level 1   RPM  30   Watts 20   Minutes 15   REL-XR   Level 3   Watts 30   Minutes 15   Prescription Details   Frequency (times per week) 3   Duration Progress to 30 minutes of continuous aerobic without signs/symptoms of physical distress   Intensity   THRR REST +  20   Ratings of Perceived Exertion 11-15   Progression Continue progressive overload as per policy without signs/symptoms or physical distress.   Resistance Training   Training Prescription Yes   Weight 2   Reps 10-12      Exercise Prescription Changes:   Discharge Exercise Prescription (Final Exercise Prescription Changes):   Nutrition:  Target Goals: Understanding of nutrition guidelines, daily intake of sodium 1500mg , cholesterol 200mg , calories 30% from fat and 7% or less from saturated fats, daily to have 5 or more servings of fruits and vegetables.  Biometrics:     Pre Biometrics - 07/24/15 1554    Pre Biometrics   Height 5' 5.8" (1.671 m)   Weight 169 lb 1.6 oz (76.703 kg)   Waist Circumference 34.5 inches   Hip Circumference 40 inches   Waist to Hip Ratio 0.86 %   BMI (Calculated) 27.5       Nutrition Therapy Plan and Nutrition Goals:     Nutrition Therapy & Goals - 07/24/15 1939    Nutrition Therapy   Drug/Food Interactions Statins/Certain Fruits   Intervention Plan   Intervention Using nutrition plan and personal goals to gain a healthy nutrition lifestyle. Add exercise as prescribed.      Nutrition Discharge: Rate Your Plate Scores:   Nutrition Goals Re-Evaluation:   Psychosocial: Target Goals: Acknowledge presence or absence of depression, maximize coping skills, provide positive support system. Participant is able to verbalize types and ability to use techniques and skills needed for reducing stress and depression.  Initial Review & Psychosocial Screening:     Initial Psych Review & Screening - 07/24/15 1941    Initial Review   Current issues with Current Anxiety/Panic;Current Stress  Concerns   Family Dynamics   Good Support System? Yes   Barriers  Psychosocial barriers to participate in program There are no identifiable barriers or psychosocial needs.;The patient should benefit from training in stress management and relaxation.   Screening Interventions   Interventions Encouraged to exercise;Program counselor consult      Quality of Life Scores:   PHQ-9:     Recent Review Flowsheet Data    Depression screen [facility] 2/9 07/24/2015   Decreased Interest 2   Down, Depressed, Hopeless 2   PHQ - 2 Score 4   Altered sleeping 3   Tired, decreased energy 1   Change in appetite 1   Feeling bad or failure about yourself  2   Trouble concentrating 0   Moving slowly or fidgety/restless 1   Suicidal thoughts 1   PHQ-9 Score 13   Difficult doing work/chores Somewhat difficult      Psychosocial Evaluation and Intervention:   Psychosocial Re-Evaluation:   Vocational Rehabilitation: Provide vocational rehab assistance to qualifying candidates.   Vocational Rehab Evaluation & Intervention:     Vocational Rehab - 07/24/15 1934    Initial Vocational Rehab Evaluation & Intervention   Assessment shows need for Vocational Rehabilitation No      Education: Education Goals: Education classes will be provided on a weekly basis, covering required topics. Participant will state understanding/return demonstration of topics presented.  Learning Barriers/Preferences:     Learning Barriers/Preferences - 07/24/15 1933    Learning Barriers/Preferences   Learning Barriers Sight   Learning Preferences Written Material      Education Topics: General Nutrition Guidelines/Fats and Fiber: -Group instruction provided by verbal, written material, models and posters to present the general guidelines for heart healthy nutrition. Gives an explanation and review of dietary fats and fiber.   Controlling Sodium/Reading Food Labels: -Group verbal and written material supporting  the discussion of sodium use in heart healthy nutrition. Review and explanation with models, verbal and written materials for utilization of the food label.   Exercise Physiology & Risk Factors: - Group verbal and written instruction with models to review the exercise physiology of the cardiovascular system and associated critical values. Details cardiovascular disease risk factors and the goals associated with each risk factor.   Aerobic Exercise & Resistance Training: - Gives group verbal and written discussion on the health impact of inactivity. On the components of aerobic and resistive training programs and the benefits of this training and how to safely progress through these programs.   Flexibility, Balance, General Exercise Guidelines: - Provides group verbal and written instruction on the benefits of flexibility and balance training programs. Provides general exercise guidelines with specific guidelines to those with heart or lung disease. Demonstration and skill practice provided.   Stress Management: - Provides group verbal and written instruction about the health risks of elevated stress, cause of high stress, and healthy ways to reduce stress.   Depression: - Provides group verbal and written instruction on the correlation between heart/lung disease and depressed mood, treatment options, and the stigmas associated with seeking treatment.   Anatomy & Physiology of the Heart: - Group verbal and written instruction and models provide basic cardiac anatomy and physiology, with the coronary electrical and arterial systems. Review of: AMI, Angina, Valve disease, Heart Failure, Cardiac Arrhythmia, Pacemakers, and the ICD.   Cardiac Procedures: - Group verbal and written instruction and models to describe the testing methods done to diagnose heart disease. Reviews the outcomes of the test results. Describes the treatment choices: Medical Management, Angioplasty, or Coronary Bypass  Surgery.   Cardiac Medications: -  Group verbal and written instruction to review commonly prescribed medications for heart disease. Reviews the medication, class of the drug, and side effects. Includes the steps to properly store meds and maintain the prescription regimen.   Go Sex-Intimacy & Heart Disease, Get SMART - Goal Setting: - Group verbal and written instruction through game format to discuss heart disease and the return to sexual intimacy. Provides group verbal and written material to discuss and apply goal setting through the application of the S.M.A.R.T. Method.   Other Matters of the Heart: - Provides group verbal, written materials and models to describe Heart Failure, Angina, Valve Disease, and Diabetes in the realm of heart disease. Includes description of the disease process and treatment options available to the cardiac patient.   Exercise & Equipment Safety: - Individual verbal instruction and demonstration of equipment use and safety with use of the equipment.          Cardiac Rehab from 07/24/2015 in [facility] Cardiac Rehab   Date  07/24/15   Educator  C. Jaiana Sheffer,RN   Instruction Review Code  1- partially meets, needs review/practice      Infection Prevention: - Provides verbal and written material to individual with discussion of infection control including proper hand washing and proper equipment cleaning during exercise session.      Cardiac Rehab from 07/24/2015 in [facility] Cardiac Rehab   Date  07/24/15   Educator  [place]   Instruction Review Code  2- meets goals/outcomes      Falls Prevention: - Provides verbal and written material to individual with discussion of falls prevention and safety.      Cardiac Rehab from 07/24/2015 in [facility] Cardiac Rehab   Date  07/24/15   Educator  C. Alverda Nazzaro,RN   Instruction Review Code  2- meets goals/outcomes      Diabetes: - Individual verbal and written instruction to review signs/symptoms of diabetes, desired ranges  of glucose level fasting, after meals and with exercise. Advice that pre and post exercise glucose checks will be done for 3 sessions at entry of program.      Cardiac Rehab from 07/24/2015 in [facility] Cardiac Rehab   Date  07/24/15   Educator  C. Shan Padgett,RN   Instruction Review Code  1- partially meets, needs review/practice       Knowledge Questionnaire Score:     Knowledge Questionnaire Score - 07/24/15 1933    Knowledge Questionnaire Score   Pre Score 24      Personal Goals and Risk Factors at Admission:     Personal Goals and Risk Factors at Admission - 07/24/15 1939    Personal Goals and Risk Factors on Admission    Weight Management Yes   Intervention Learn and follow the exercise and diet guidelines while in the program. Utilize the nutrition and education classes to help gain knowledge of the diet and exercise expectations in the program   Increase Aerobic Exercise and Physical Activity Yes   Intervention While in program, learn and follow the exercise prescription taught. Start at a low level workload and increase workload after able to maintain previous level for 30 minutes. Increase time before increasing intensity.   Take Less Medication Yes   Intervention Learn your risk factors and begin the lifestyle modifications for risk factor control during your time in the program.   Understand more about Heart/Pulmonary Disease. Yes   Intervention While in program utilize professionals for any questions, and attend the education sessions. Great websites to use are www.americanheart.org or  www.lung.org for reliable information.   Diabetes Yes   Goal Blood glucose control identified by blood glucose values, HgbA1C. Participant verbalizes understanding of the signs/symptoms of hyper/hypo glycemia, proper foot care and importance of medication and nutrition plan for blood glucose control.   Intervention Provide nutrition & aerobic exercise along with prescribed medications to achieve  blood glucose in normal ranges: Fasting 65-99 mg/dL   Hypertension Yes   Goal Participant will see blood pressure controlled within the values of 140/59mm/Hg or within value directed by their physician.   Intervention Provide nutrition & aerobic exercise along with prescribed medications to achieve BP 140/90 or less.   Lipids Yes   Goal Cholesterol controlled with medications as prescribed, with individualized exercise RX and with personalized nutrition plan. Value goals: LDL < 70mg , HDL > 40mg . Participant states understanding of desired cholesterol values and following prescriptions.   Intervention Provide nutrition & aerobic exercise along with prescribed medications to achieve LDL 70mg , HDL >40mg .   Stress Yes   Goal To meet with psychosocial counselor for stress and relaxation information and guidance. To state understanding of performing relaxation techniques and or identifying personal stressors.   Intervention Provide education on types of stress, identifiying stressors, and ways to cope with stress. Provide demonstration and active practice of relaxation techniques.      Personal Goals and Risk Factors Review:    Personal Goals Discharge:     Comments: Ready to start Cardiac Rehab.

## 2015-07-26 ENCOUNTER — Other Ambulatory Visit: Payer: Self-pay | Admitting: *Deleted

## 2015-07-26 ENCOUNTER — Encounter: Payer: Medicaid Other | Admitting: *Deleted

## 2015-07-26 ENCOUNTER — Encounter: Payer: Self-pay | Admitting: *Deleted

## 2015-07-26 DIAGNOSIS — I252 Old myocardial infarction: Secondary | ICD-10-CM | POA: Diagnosis not present

## 2015-07-26 DIAGNOSIS — Z95820 Peripheral vascular angioplasty status with implants and grafts: Secondary | ICD-10-CM

## 2015-07-26 LAB — GLUCOSE, CAPILLARY
Glucose-Capillary: 134 mg/dL — ABNORMAL HIGH (ref 65–99)
Glucose-Capillary: 75 mg/dL (ref 65–99)

## 2015-07-26 NOTE — Progress Notes (Signed)
Daily Session Note  Patient Details  Name: [name] [name] MRN: [id number] Date of Birth: Apr 28, 1981 Referring Provider:  Wellington Hampshire, MD  Encounter Date: 07/26/2015  Check In:     Session Check In - 07/26/15 1706    Check-In   Staff Present Diane Joya Gaskins RN, BSN;Carroll Enterkin RN, BSN  Hessie Knows, BS, Exercise Specialist   ER physicians immediately available to respond to emergencies See telemetry face sheet for immediately available ER MD   Medication changes reported     No   Fall or balance concerns reported    No   Warm-up and Cool-down Performed on first and last piece of equipment   VAD Patient? No   Pain Assessment   Currently in Pain? No/denies   Pain Score 0-No pain         Goals Met:  Exercise tolerated well No report of cardiac concerns or symptoms Strength training completed today  Goals Unmet:  Not Applicable  Goals Comments: First visit in Cardiac Rehab program today.  Tolerated well.     Dr. Emily Filbert is Medical Director for [place] and [facility].

## 2015-07-27 DIAGNOSIS — I252 Old myocardial infarction: Secondary | ICD-10-CM | POA: Diagnosis not present

## 2015-07-27 LAB — GLUCOSE, CAPILLARY
Glucose-Capillary: 96 mg/dL (ref 65–99)
Glucose-Capillary: 100 mg/dL — ABNORMAL HIGH (ref 65–99)

## 2015-07-27 NOTE — Progress Notes (Signed)
Daily Session Note  Patient Details  Name: Jania Steinke MRN: [id number] Date of Birth: 07-Jul-1981 Referring Provider:  Wellington Hampshire, MD  Encounter Date: 07/27/2015  Check In:     Session Check In - 07/27/15 1622    Check-In   Staff Present Lestine Box BS, ACSM EP-C, Exercise Physiologist;Carroll Enterkin RN, BSN;Diane Joya Gaskins RN, BSN   ER physicians immediately available to respond to emergencies See telemetry face sheet for immediately available ER MD   Medication changes reported     No   Fall or balance concerns reported    No   Warm-up and Cool-down Performed on first and last piece of equipment   VAD Patient? No   Pain Assessment   Currently in Pain? No/denies         Goals Met:  Proper associated with RPD/PD & O2 Sat Exercise tolerated well No report of cardiac concerns or symptoms Strength training completed today  Goals Unmet:  Not Applicable  Goals Comments:    Dr. Emily Filbert is Medical Director for [place] and [facility].

## 2015-07-27 NOTE — Discharge Summary (Signed)
[name] [name], is a 34 y.o. female  DOB 1981-08-29  MRN [id number].  Admission date:  07/22/2015  Admitting Physician  Epifanio Lesches, MD  Discharge Date:  07/23/2015   Primary MD  Lorelee Market, MD  Recommendations for primary care physician for things to follow:  Follow-up with cardiology as an outpatient.   Admission Diagnosis  Chest pain, unspecified chest pain type [R07.9]   Discharge Diagnosis  Chest pain, unspecified chest pain type [R07.9]    Active Problems:   Chest pain      Past Medical History  Diagnosis Date  . CAD (coronary artery disease)     a. NSTEMI 03/2015: mLCx 100% s/p PCI/DES, RI 90% s/p PCI/DES, EF 35%; b. 04/2015 Relook Cath: LM nl, LAD 40p, RI patent stent, LCX patent stent, RCA nl, EF 55-65%.  . Chronic systolic CHF (congestive heart failure)     a. echo 03/2015: EF 30-35%, mild concentric LVH, severe HK of inf, inflat, & lat walls, mod MR, mild TR;  b. 04/2015 LV Gram: EF 55-65%.  . Ischemic cardiomyopathy     a. 03/2015 EF 30-35% post NSTEMI;  b. 04/2015 EF 55-65% on LV gram.  . DM2 (diabetes mellitus, type 2)     a. since 2002  . Tobacco abuse     a. quit 03/2015.  . Obesity   . Hyperlipidemia   . Hypotension     a. resulting in discontinuation of losartan and reduction in coreg dose.  . MI (myocardial infarction)     Past Surgical History  Procedure Laterality Date  . Mouth surgery    . Cardiac catheterization  4/16    x2 stent [facility]  . Cardiac catheterization N/A 05/05/2015    Procedure: Left Heart Cath and Coronary Angiography;  Surgeon: Peter M Martinique, MD;  Location: [facility];  Service: Cardiovascular;  Laterality: N/A;       History of present illness and  Hospital Course:     Kindly see H&P for history of present illness and admission details, please  review complete Labs, Consult reports and Test reports for all details in brief  HPI  from the history and physical done on the day of admission 34 year old female patient with history of hypertension, diabetes admitted for chest pain. Monitored on telemetry. Troponins have been negative 3.   Hospital Course  #1 chest pain with some nausea and diarrhea: Patient admitted to telemetry come, continued on aspirin, home medications which are aspirin, Brilinta, nitrates, seen by cardiology from [facility], they recommended no further workup because patient had a normal cardiac catheter in May. And that she can follow up with Dr. Shanon Brow as an outpatient. Chest pain thought to be secondary to GI related R other than the acute heart attack. Patient has history of CAD, history of stent placement in the left circumflex in April this year. Had a normal cardiac catheter in May 26 with patent stents. #2 history of diabetes mellitus type 2 started on metformin by Dr. Brunetta Genera recently but patient not able to tolerate metformin secondary to causing her to have nausea and also diarrhea. So I discontinued the metformin. Advised to continue Amaryl, advised her to discuss with Dr. Brunetta Genera to possibly start her on something different like Actos. #3 for nausea we're Zofran. For diarrhea we stopped the metformin and the diarrhea resolved. #4 history of coronary artery disease she is on now Brilinta ,simvastatin, aspirin.    Discharge Condition: stable  Follow UP  Follow-up Information    Follow up with Kathlyn Sacramento, MD In 2 days.   Specialty:  Cardiology   Contact information:   [place] [address] [phone]       Follow up with Lorelee Market, MD In 1 week.   Specialty:  Family Medicine   Contact information:   [place] [address] [phone]         Discharge Instructions  and  Discharge Medications        Medication List     STOP taking these medications        metFORMIN 1000 MG tablet  Commonly known as:  GLUCOPHAGE      TAKE these medications        aspirin EC 81 MG tablet  Take 81 mg by mouth daily.     BREO ELLIPTA IN  Inhale 1 puff into the lungs daily as needed.     glimepiride 4 MG tablet  Commonly known as:  AMARYL  Take 4 mg by mouth daily with breakfast.     nitroGLYCERIN 0.4 MG SL tablet  Commonly known as:  NITROSTAT  Place 0.4 mg under the tongue every 5 (five) minutes as needed for chest pain.     simvastatin 20 MG tablet  Commonly known as:  ZOCOR  Take 20 mg by mouth daily.     ticagrelor 90 MG Tabs tablet  Commonly known as:  BRILINTA  Take 90 mg by mouth 2 (two) times daily.          Diet and Activity recommendation: See Discharge Instructions above   Consults obtained - cardiology   Major procedures and Radiology Reports - PLEASE review detailed and final reports for all details, in brief -     No results found.  Micro Results    No results found for this or any previous visit (from the past 240 hour(s)).     Today   Subjective:   [name] [name] today has no headache,no chest abdominal pain,no new weakness tingling or numbness, feels much better wants to go home today. *  Objective:   Blood pressure 95/62, pulse 90, temperature 98.3 F (36.8 C), temperature source Oral, resp. rate 14, height 5\' 4"  (1.626 m), weight 75.796 kg (167 lb 1.6 oz), last menstrual period 07/10/2015, SpO2 100 %.  No intake or output data in the 24 hours ending 07/27/15 1313  Exam Awake Alert, Oriented x 3, No new F.N deficits, Normal affect [place].AT,PERRAL Supple Neck,No JVD, No cervical lymphadenopathy appriciated.  Symmetrical Chest wall movement, Good air movement bilaterally, CTAB RRR,No Gallops,Rubs or new Murmurs, No Parasternal Heave +ve B.Sounds, Abd Soft, Non tender, No organomegaly appriciated, No rebound -guarding or rigidity. No Cyanosis, Clubbing or edema,  No new Rash or bruise  Data Review   CBC w Diff:  Lab Results  Component Value Date   WBC 6.8 07/22/2015   WBC 6.1 03/29/2015   HGB 8.1* 07/22/2015   HGB 10.8* 03/29/2015   HCT 24.8* 07/22/2015   HCT 33.4* 03/29/2015   PLT 419 07/22/2015   PLT 428 03/29/2015   LYMPHOPCT 29 07/22/2015   MONOPCT 6 07/22/2015   EOSPCT 7 07/22/2015   BASOPCT 1 07/22/2015    CMP:  Lab Results  Component Value Date   NA 138 07/22/2015   NA 131* 03/31/2015   K 3.8 07/22/2015   K 3.6 03/31/2015   CL 107 07/22/2015   CL 105 03/31/2015   CO2  22 07/22/2015   CO2 22 03/31/2015   BUN 20 07/22/2015   BUN 13 03/31/2015   CREATININE 1.01* 07/22/2015   CREATININE 0.87 03/31/2015   PROT 6.8 05/05/2015   ALBUMIN 3.1* 05/05/2015   BILITOT 0.6 05/05/2015   ALKPHOS 80 05/05/2015   AST 21 05/05/2015   ALT 16 05/05/2015  .   Total Time in preparing paper work, data evaluation and todays exam - 18 minutes  Cleona Doubleday M.D on 07/23/2015 at 1:13 PM

## 2015-07-31 DIAGNOSIS — I252 Old myocardial infarction: Secondary | ICD-10-CM

## 2015-07-31 LAB — GLUCOSE, CAPILLARY
Glucose-Capillary: 131 mg/dL — ABNORMAL HIGH (ref 65–99)
Glucose-Capillary: 86 mg/dL (ref 65–99)

## 2015-07-31 NOTE — Progress Notes (Signed)
Daily Session Note  Patient Details  Name: [name] [name] MRN: [id number] Date of Birth: 04/04/1981 Referring Provider:  Arida, Muhammad A, MD  Encounter Date: 07/31/2015  Check In:     Session Check In - 07/31/15 1643    Check-In   Staff Present Susanne Bice RN, BSN, CCRP;Carroll Enterkin RN, BSN;  BS, ACSM EP-C, Exercise Physiologist   ER physicians immediately available to respond to emergencies See telemetry face sheet for immediately available ER MD   Medication changes reported     No   Fall or balance concerns reported    No   Warm-up and Cool-down Performed on first and last piece of equipment   VAD Patient? No   Pain Assessment   Currently in Pain? No/denies         Goals Met:  Proper associated with RPD/PD & O2 Sat Exercise tolerated well No report of cardiac concerns or symptoms Strength training completed today  Goals Unmet:  Not Applicable  Goals Comments: [name] came to class complaining of being prescribed medications that were duplicated and were not warranted for her current problems.  She states she has little to no trust in her physician, and is actively searching for a new one.  She stated she was not going to take her new medicines that were called in.  They could be potentially dangerous for her to do so.  She also stated that here PCP Dr. Niemeyer had made inappropriate comments to her during an exam.  She was provided with information on how to submit a complaint.   Dr. Mark Miller is Medical Director for [facility] and [facility]. 

## 2015-08-02 ENCOUNTER — Encounter: Payer: Medicaid Other | Admitting: *Deleted

## 2015-08-02 DIAGNOSIS — I252 Old myocardial infarction: Secondary | ICD-10-CM | POA: Diagnosis not present

## 2015-08-02 LAB — GLUCOSE, CAPILLARY
Glucose-Capillary: 120 mg/dL — ABNORMAL HIGH (ref 65–99)
Glucose-Capillary: 162 mg/dL — ABNORMAL HIGH (ref 65–99)
Glucose-Capillary: 87 mg/dL (ref 65–99)

## 2015-08-02 NOTE — Progress Notes (Signed)
Daily Session Note  Patient Details  Name: Rebeca Valdivia MRN: [id number] Date of Birth: 02-12-1981 Referring Provider:  Lorelee Market, MD  Encounter Date: 08/02/2015  Check In:     Session Check In - 08/02/15 1717    Check-In   Staff Present Gerlene Burdock RN, BSN;Mamie Hundertmark Dillard Essex MS, ACSM CEP Exercise Physiologist;Diane Mariana Arn, BSN   ER physicians immediately available to respond to emergencies See telemetry face sheet for immediately available ER MD   Medication changes reported     No   Fall or balance concerns reported    No   Warm-up and Cool-down Performed on first and last piece of equipment   VAD Patient? No   Pain Assessment   Currently in Pain? No/denies   Multiple Pain Sites No           Exercise Prescription Changes - 08/02/15 1700    Exercise Review   Progression Yes   Response to Exercise   Symptoms Some nausea    Duration Progress to 30 minutes of continuous aerobic without signs/symptoms of physical distress   Intensity Rest + 30   Progression Continue progressive overload as per policy without signs/symptoms or physical distress.   Resistance Training   Training Prescription Yes   Weight 2   Reps 10-15   Interval Training   Interval Training No   Recumbant Elliptical   Level 3   RPM 40   Minutes 15      Goals Met:  Independence with exercise equipment Exercise tolerated well Personal goals reviewed No report of cardiac concerns or symptoms Strength training completed today  Goals Unmet:  Not Applicable  Goals Comments: Reviewed individualized exercise prescription and made increases per departmental policy. Exercise increases were discussed with the patient and they were able to perform the new work loads without issue (no signs or symptoms).     Dr. Emily Filbert is Medical Director for [facility] and [facility].

## 2015-08-10 ENCOUNTER — Encounter: Payer: Medicaid Other | Attending: Cardiovascular Disease

## 2015-08-10 DIAGNOSIS — I252 Old myocardial infarction: Secondary | ICD-10-CM | POA: Insufficient documentation

## 2015-08-18 ENCOUNTER — Telehealth: Payer: Self-pay | Admitting: Cardiovascular Disease

## 2015-08-18 NOTE — Telephone Encounter (Signed)
S/w pt who states she would like name of PCP who is accepting new patients.  States she has seen Dr. Bernita Buffy twice and would like to try someone else. Gave her phone number to [employer] and [employer] thanked me and states she will call them to establish care

## 2015-08-18 NOTE — Telephone Encounter (Signed)
Patient was referred by Fletcher Anon to see pcp niemeyer  Patient not satisfied wants second recommendation please call

## 2015-08-21 ENCOUNTER — Observation Stay
Admission: EM | Admit: 2015-08-21 | Discharge: 2015-08-22 | Disposition: A | Discharge status: Home / Self Care | Payer: Medicaid Other | Attending: Internal Medicine | Admitting: Internal Medicine

## 2015-08-21 ENCOUNTER — Encounter: Payer: Self-pay | Admitting: Emergency Medicine

## 2015-08-21 ENCOUNTER — Encounter: Payer: Self-pay | Admitting: *Deleted

## 2015-08-21 ENCOUNTER — Emergency Department: Payer: Medicaid Other

## 2015-08-21 ENCOUNTER — Telehealth: Payer: Self-pay | Admitting: Cardiovascular Disease

## 2015-08-21 DIAGNOSIS — I255 Ischemic cardiomyopathy: Secondary | ICD-10-CM | POA: Insufficient documentation

## 2015-08-21 DIAGNOSIS — R079 Chest pain, unspecified: Secondary | ICD-10-CM | POA: Diagnosis present

## 2015-08-21 DIAGNOSIS — R197 Diarrhea, unspecified: Secondary | ICD-10-CM | POA: Diagnosis not present

## 2015-08-21 DIAGNOSIS — Z955 Presence of coronary angioplasty implant and graft: Secondary | ICD-10-CM | POA: Insufficient documentation

## 2015-08-21 DIAGNOSIS — I509 Heart failure, unspecified: Secondary | ICD-10-CM | POA: Insufficient documentation

## 2015-08-21 DIAGNOSIS — I5022 Chronic systolic (congestive) heart failure: Secondary | ICD-10-CM | POA: Diagnosis not present

## 2015-08-21 DIAGNOSIS — R0602 Shortness of breath: Principal | ICD-10-CM | POA: Insufficient documentation

## 2015-08-21 DIAGNOSIS — D649 Anemia, unspecified: Secondary | ICD-10-CM | POA: Diagnosis present

## 2015-08-21 DIAGNOSIS — E119 Type 2 diabetes mellitus without complications: Secondary | ICD-10-CM | POA: Insufficient documentation

## 2015-08-21 DIAGNOSIS — E669 Obesity, unspecified: Secondary | ICD-10-CM | POA: Diagnosis not present

## 2015-08-21 DIAGNOSIS — N920 Excessive and frequent menstruation with regular cycle: Secondary | ICD-10-CM | POA: Diagnosis not present

## 2015-08-21 DIAGNOSIS — Z7982 Long term (current) use of aspirin: Secondary | ICD-10-CM | POA: Diagnosis not present

## 2015-08-21 DIAGNOSIS — R42 Dizziness and giddiness: Secondary | ICD-10-CM | POA: Diagnosis not present

## 2015-08-21 DIAGNOSIS — Z91013 Allergy to seafood: Secondary | ICD-10-CM | POA: Diagnosis not present

## 2015-08-21 DIAGNOSIS — K219 Gastro-esophageal reflux disease without esophagitis: Secondary | ICD-10-CM | POA: Insufficient documentation

## 2015-08-21 DIAGNOSIS — Z888 Allergy status to other drugs, medicaments and biological substances status: Secondary | ICD-10-CM | POA: Insufficient documentation

## 2015-08-21 DIAGNOSIS — E785 Hyperlipidemia, unspecified: Secondary | ICD-10-CM | POA: Diagnosis not present

## 2015-08-21 DIAGNOSIS — Z9889 Other specified postprocedural states: Secondary | ICD-10-CM | POA: Diagnosis not present

## 2015-08-21 DIAGNOSIS — I252 Old myocardial infarction: Secondary | ICD-10-CM | POA: Diagnosis not present

## 2015-08-21 DIAGNOSIS — E860 Dehydration: Secondary | ICD-10-CM | POA: Insufficient documentation

## 2015-08-21 DIAGNOSIS — I429 Cardiomyopathy, unspecified: Secondary | ICD-10-CM | POA: Insufficient documentation

## 2015-08-21 DIAGNOSIS — Z87891 Personal history of nicotine dependence: Secondary | ICD-10-CM | POA: Insufficient documentation

## 2015-08-21 DIAGNOSIS — Z79899 Other long term (current) drug therapy: Secondary | ICD-10-CM | POA: Diagnosis not present

## 2015-08-21 DIAGNOSIS — I2510 Atherosclerotic heart disease of native coronary artery without angina pectoris: Secondary | ICD-10-CM | POA: Insufficient documentation

## 2015-08-21 DIAGNOSIS — E162 Hypoglycemia, unspecified: Secondary | ICD-10-CM | POA: Insufficient documentation

## 2015-08-21 DIAGNOSIS — R112 Nausea with vomiting, unspecified: Secondary | ICD-10-CM | POA: Diagnosis not present

## 2015-08-21 DIAGNOSIS — D509 Iron deficiency anemia, unspecified: Secondary | ICD-10-CM | POA: Diagnosis not present

## 2015-08-21 DIAGNOSIS — I209 Angina pectoris, unspecified: Secondary | ICD-10-CM | POA: Insufficient documentation

## 2015-08-21 HISTORY — DX: Excessive and frequent menstruation with regular cycle: N92.0

## 2015-08-21 HISTORY — DX: Iron deficiency anemia, unspecified: D50.9

## 2015-08-21 LAB — CBC
HCT: 24.1 % — ABNORMAL LOW (ref 35.0–47.0)
Hemoglobin: 7.6 g/dL — ABNORMAL LOW (ref 12.0–16.0)
MCH: 22.2 pg — ABNORMAL LOW (ref 26.0–34.0)
MCHC: 31.5 g/dL — ABNORMAL LOW (ref 32.0–36.0)
MCV: 70.4 fL — ABNORMAL LOW (ref 80.0–100.0)
Platelets: 475 10*3/uL — ABNORMAL HIGH (ref 150–440)
RBC: 3.43 MIL/uL — ABNORMAL LOW (ref 3.80–5.20)
RDW: 17.7 % — ABNORMAL HIGH (ref 11.5–14.5)
WBC: 8 10*3/uL (ref 3.6–11.0)

## 2015-08-21 LAB — BASIC METABOLIC PANEL
Anion gap: 6 (ref 5–15)
BUN: 12 mg/dL (ref 6–20)
CO2: 23 mmol/L (ref 22–32)
Calcium: 8.6 mg/dL — ABNORMAL LOW (ref 8.9–10.3)
Chloride: 108 mmol/L (ref 101–111)
Creatinine, Ser: 0.81 mg/dL (ref 0.44–1.00)
GFR calc Af Amer: >60 mL/min (ref >60)
GFR calc non Af Amer: >60 mL/min (ref >60)
Glucose, Bld: 120 mg/dL — ABNORMAL HIGH (ref 65–99)
Potassium: 4.1 mmol/L (ref 3.5–5.1)
Sodium: 137 mmol/L (ref 135–145)

## 2015-08-21 LAB — IRON AND TIBC
Iron: 25 ug/dL — ABNORMAL LOW (ref 28–170)
Saturation Ratios: 6 % — ABNORMAL LOW (ref 10.4–31.8)
TIBC: 405 ug/dL (ref 250–450)
UIBC: 380 ug/dL

## 2015-08-21 LAB — ABO/RH: ABO/RH(D): A POS

## 2015-08-21 LAB — TROPONIN I
Troponin I: <0.03 ng/mL (ref <0.031)
Troponin I: <0.03 ng/mL (ref <0.031)

## 2015-08-21 LAB — TSH: TSH: 1.448 u[IU]/mL (ref 0.350–4.500)

## 2015-08-21 LAB — FERRITIN: Ferritin: 5 ng/mL — ABNORMAL LOW (ref 11–307)

## 2015-08-21 LAB — PREPARE RBC (CROSSMATCH)

## 2015-08-21 MED ORDER — SODIUM CHLORIDE 0.9 % IV SOLN: 250.0000 mL | INTRAVENOUS | Status: DC | PRN

## 2015-08-21 MED ORDER — ACETAMINOPHEN 325 MG PO TABS
650.0000 mg | ORAL_TABLET | Freq: Four times a day (QID) | ORAL | Status: DC | PRN
  Administered 2015-08-21: 650 mg via ORAL
  Filled 2015-08-21: qty 2

## 2015-08-21 MED ORDER — SODIUM CHLORIDE 0.9 % IV SOLN
Freq: Once | INTRAVENOUS | Status: AC
  Administered 2015-08-21: 19:00:00 via INTRAVENOUS

## 2015-08-21 MED ORDER — INSULIN ASPART 100 UNIT/ML ~~LOC~~ SOLN
0.0000 [IU] | Freq: Three times a day (TID) | SUBCUTANEOUS | Status: DC
  Administered 2015-08-22: 1 [IU] via SUBCUTANEOUS
  Administered 2015-08-22: 2 [IU] via SUBCUTANEOUS
  Filled 2015-08-21: qty 2
  Filled 2015-08-21: qty 1

## 2015-08-21 MED ORDER — SODIUM CHLORIDE 0.9 % IJ SOLN: 3.0000 mL | INTRAMUSCULAR | Status: DC | PRN

## 2015-08-21 MED ORDER — SODIUM CHLORIDE 0.9 % IJ SOLN
3.0000 mL | Freq: Two times a day (BID) | INTRAMUSCULAR | Status: DC
Start: 2015-08-21 — End: 2015-08-22

## 2015-08-21 MED ORDER — ONDANSETRON HCL 4 MG/2ML IJ SOLN: 4.0000 mg | Freq: Four times a day (QID) | INTRAMUSCULAR | Status: DC | PRN

## 2015-08-21 MED ORDER — ONDANSETRON HCL 4 MG PO TABS: 4.0000 mg | ORAL_TABLET | Freq: Four times a day (QID) | ORAL | Status: DC | PRN

## 2015-08-21 MED ORDER — SIMVASTATIN 20 MG PO TABS
20.0000 mg | ORAL_TABLET | Freq: Every day | ORAL | Status: DC
  Administered 2015-08-21: 20 mg via ORAL
  Filled 2015-08-21: qty 1

## 2015-08-21 MED ORDER — TICAGRELOR 90 MG PO TABS
90.0000 mg | ORAL_TABLET | Freq: Two times a day (BID) | ORAL | Status: DC
Start: 2015-08-21 — End: 2015-08-22
  Administered 2015-08-21 – 2015-08-22 (×2): 90 mg via ORAL
  Filled 2015-08-21 (×2): qty 1

## 2015-08-21 MED ORDER — METFORMIN HCL 500 MG PO TABS
1000.0000 mg | ORAL_TABLET | Freq: Two times a day (BID) | ORAL | Status: DC
  Filled 2015-08-21 (×2): qty 2

## 2015-08-21 MED ORDER — ASPIRIN EC 81 MG PO TBEC
81.0000 mg | DELAYED_RELEASE_TABLET | Freq: Every day | ORAL | Status: DC
  Administered 2015-08-21 – 2015-08-22 (×2): 81 mg via ORAL
  Filled 2015-08-21 (×2): qty 1

## 2015-08-21 MED ORDER — SODIUM CHLORIDE 0.9 % IV SOLN: 10.0000 mL/h | Freq: Once | INTRAVENOUS | Status: DC

## 2015-08-21 MED ORDER — ACETAMINOPHEN 650 MG RE SUPP: 650.0000 mg | Freq: Four times a day (QID) | RECTAL | Status: DC | PRN

## 2015-08-21 MED ORDER — NITROGLYCERIN 0.4 MG SL SUBL: 0.4000 mg | SUBLINGUAL_TABLET | SUBLINGUAL | Status: DC | PRN

## 2015-08-21 MED ORDER — GLIMEPIRIDE 2 MG PO TABS
4.0000 mg | ORAL_TABLET | Freq: Every day | ORAL | Status: DC
  Administered 2015-08-22: 4 mg via ORAL
  Filled 2015-08-21: qty 2

## 2015-08-21 NOTE — H&P (Signed)
[facility] [place] NAME: [name] [name]    MR#:  [id number]  DATE OF BIRTH:  [birth year]  DATE OF ADMISSION:  08/21/2015  PRIMARY CARE PHYSICIAN: Lorelee Market, MD   REQUESTING/REFERRING PHYSICIAN:   CHIEF COMPLAINT:   Chief Complaint  Patient presents with  . Chest Pain    HISTORY OF PRESENT ILLNESS: [name] [name]  is a 34 y.o. female with a known history of CAD with 2 stent who presents with complaint of chest pressure and dyspnea on exertion. She reports that the chest pressure is ongoing for the past few months. She also has dyspnea on exertion this progressively gotten worse. Patient does have history of heavy menstruations and has noted to have progressive anemia in the emergency room. Her hemoglobin is trended down to 7.6. Patient denies any nausea or vomiting or radiation of the pain. The emergency room physician discussed the case with patient's primary cardiologist.      PAST MEDICAL HISTORY:   Past Medical History  Diagnosis Date  . CAD (coronary artery disease)     a. NSTEMI 03/2015: mLCx 100% s/p PCI/DES, RI 90% s/p PCI/DES, EF 35%; b. 04/2015 Relook Cath: LM nl, LAD 40p, RI patent stent, LCX patent stent, RCA nl, EF 55-65%.  . Chronic systolic CHF (congestive heart failure)     a. echo 03/2015: EF 30-35%, mild concentric LVH, severe HK of inf, inflat, & lat walls, mod MR, mild TR;  b. 04/2015 LV Gram: EF 55-65%.  . Ischemic cardiomyopathy     a. 03/2015 EF 30-35% post NSTEMI;  b. 04/2015 EF 55-65% on LV gram.  . DM2 (diabetes mellitus, type 2)     a. since 2002  . Tobacco abuse     a. quit 03/2015.  . Obesity   . Hyperlipidemia   . Hypotension     a. resulting in discontinuation of losartan and reduction in coreg dose.  . MI (myocardial infarction)     PAST SURGICAL HISTORY:  Past Surgical History  Procedure Laterality Date  . Mouth surgery    . Cardiac catheterization  4/16    x2 stent [facility]  . Cardiac  catheterization N/A 05/05/2015    Procedure: Left Heart Cath and Coronary Angiography;  Surgeon: Peter M Martinique, MD;  Location: [facility];  Service: Cardiovascular;  Laterality: N/A;    SOCIAL HISTORY:  Social History  Substance Use Topics  . Smoking status: Former Research scientist (life sciences)  . Smokeless tobacco: Not on file  . Alcohol Use: No    FAMILY HISTORY:  Family History  Problem Relation Age of Onset  . Diabetes type II      DRUG ALLERGIES:  Allergies  Allergen Reactions  . Lisinopril Itching and Swelling  . Shellfish Allergy Itching and Swelling    REVIEW OF SYSTEMS:   CONSTITUTIONAL: No fever, fatigue or weakness.  EYES: No blurred or double vision.  EARS, NOSE, AND THROAT: No tinnitus or ear pain.  RESPIRATORY: No cough, positive shortness of breath, wheezing or hemoptysis. CARDIOVASCULAR: Positive chest pain, orthopnea, edema.  GASTROINTESTINAL: No nausea, vomiting, diarrhea or abdominal pain.  GENITOURINARY: No dysuria, hematuria.  ENDOCRINE: No polyuria, nocturia,  HEMATOLOGY: No anemia, easy bruising or bleeding SKIN: No rash or lesion. MUSCULOSKELETAL: No joint pain or arthritis.   NEUROLOGIC: No tingling, numbness, weakness.  PSYCHIATRY: No anxiety or depression.   MEDICATIONS AT HOME:  Prior to Admission medications   Medication Sig Start Date End Date Taking? Authorizing  Provider  aspirin EC 81 MG tablet Take 81 mg by mouth daily.   Yes Historical Provider, MD  glimepiride (AMARYL) 4 MG tablet Take 4 mg by mouth daily.    Yes Historical Provider, MD  metFORMIN (GLUCOPHAGE) 1000 MG tablet Take 1,000 mg by mouth 2 (two) times daily.    Yes Historical Provider, MD  nitroGLYCERIN (NITROSTAT) 0.4 MG SL tablet Place 0.4 mg under the tongue every 5 (five) minutes as needed for chest pain.   Yes Historical Provider, MD  simvastatin (ZOCOR) 20 MG tablet Take 20 mg by mouth at bedtime.    Yes Historical Provider, MD  ticagrelor (BRILINTA) 90 MG TABS tablet Take 90 mg by  mouth 2 (two) times daily.   Yes Historical Provider, MD      PHYSICAL EXAMINATION:   VITAL SIGNS: Blood pressure 151/85, pulse 100, temperature 98.5 F (36.9 C), temperature source Oral, resp. rate 18, height 5\' 4"  (1.626 m), weight 78.472 kg (173 lb), last menstrual period 08/10/2015, SpO2 100 %.  GENERAL:  33 y.o.-year-old patient lying in the bed with no acute distress.  EYES: Pupils equal, round, reactive to light and accommodation. No scleral icterus. Extraocular muscles intact.  HEENT: Head atraumatic, normocephalic. Oropharynx and nasopharynx clear.  NECK:  Supple, no jugular venous distention. No thyroid enlargement, no tenderness.  LUNGS: Normal breath sounds bilaterally, no wheezing, rales,rhonchi or crepitation. No use of accessory muscles of respiration.  CARDIOVASCULAR: S1, S2 normal. No murmurs, rubs, or gallops.  ABDOMEN: Soft, nontender, nondistended. Bowel sounds present. No organomegaly or mass.  EXTREMITIES: No pedal edema, cyanosis, or clubbing.  NEUROLOGIC: Cranial nerves II through XII are intact. Muscle strength 5/5 in all extremities. Sensation intact. Gait not checked.  PSYCHIATRIC: The patient is alert and oriented x 3.  SKIN: No obvious rash, lesion, or ulcer.   LABORATORY PANEL:   CBC  Recent Labs Lab 08/21/15 1310  WBC 8.0  HGB 7.6*  HCT 24.1*  PLT 475*  MCV 70.4*  MCH 22.2*  MCHC 31.5*  RDW 17.7*   ------------------------------------------------------------------------------------------------------------------  Chemistries   Recent Labs Lab 08/21/15 1310  NA 137  K 4.1  CL 108  CO2 23  GLUCOSE 120*  BUN 12  CREATININE 0.81  CALCIUM 8.6*   ------------------------------------------------------------------------------------------------------------------ estimated creatinine clearance is 98.2 mL/min (by C-G formula based on Cr of  0.81). ------------------------------------------------------------------------------------------------------------------ No results for input(s): TSH, T4TOTAL, T3FREE, THYROIDAB in the last 72 hours.  Invalid input(s): FREET3   Coagulation profile No results for input(s): INR, PROTIME in the last 168 hours. ------------------------------------------------------------------------------------------------------------------- No results for input(s): DDIMER in the last 72 hours. -------------------------------------------------------------------------------------------------------------------  Cardiac Enzymes  Recent Labs Lab 08/21/15 1310  TROPONINI <0.03   ------------------------------------------------------------------------------------------------------------------ Invalid input(s): POCBNP  ---------------------------------------------------------------------------------------------------------------  Urinalysis    Component Value Date/Time   COLORURINE Yellow 03/29/2015 2345   APPEARANCEUR Cloudy 03/29/2015 2345   LABSPEC 1.023 03/29/2015 2345   PHURINE 5.0 03/29/2015 2345   GLUCOSEU >=500 03/29/2015 2345   HGBUR 3+ 03/29/2015 2345   BILIRUBINUR Negative 03/29/2015 2345   KETONESUR Negative 03/29/2015 2345   PROTEINUR 100 mg/dL 03/29/2015 2345   NITRITE Negative 03/29/2015 2345   LEUKOCYTESUR 3+ 03/29/2015 2345     RADIOLOGY: Dg Chest 2 View  08/21/2015   CLINICAL DATA:  states she is having chest pain/pressure with radiating between shoulder blades.States it started last night. Took 3 nitro, able to sleep.Has taken one nitro today Feels sweaty and light-headed. N/V/Diarrhea . Pt feels it could be related to metformin Feels  bloated. Hx of MI and cardiac cath this past may, hx of CAD, CHF, tobacco abuse, DM2  EXAM: CHEST  2 VIEW  COMPARISON:  05/05/2015  FINDINGS: The heart size and mediastinal contours are within normal limits. Both lungs are clear. No pleural effusion  or pneumothorax. The visualized skeletal structures are unremarkable.  IMPRESSION: No active cardiopulmonary disease.   Electronically Signed   By: Lajean Manes M.D.   On: 08/21/2015 14:07    EKG: Orders placed or performed during the hospital encounter of 08/21/15  . EKG 12-Lead  . EKG 12-Lead  . ED EKG within 10 minutes  . ED EKG within 10 minutes    IMPRESSION AND PLAN: Patient is a 34 year old African-American female presents with chest pressure shortness of breath  1. Chest pressure shortness of breath likely related to anemia. At this time will go ahead and transfuse patient 1 unit 1 unit of packed RBCs. Serial cardiac enzymes and cardiology consult  2. Iron deficiency anemia due to heavy menstruation patient will need to be started on iron I'll check her iron and ferritin level transfuse 1 unit as above  3. Coronary artery disease continue aspirin and  brlinta  4. Diabetes type 2 continue glimepiride and metformin and place patient on sliding scale   5. Hyperlipidemia continue simvastatin    All the records are reviewed and case discussed with ED provider. Management plans discussed with the patient, family and they are in agreement.  CODE STATUS:    Code Status Orders        Start     Ordered   08/21/15 1735  Full code   Continuous     08/21/15 1736       TOTAL TIME TAKING CARE OF THIS PATIENT: 55 minutes.    Dustin Flock M.D on 08/21/2015 at 5:41 PM  Between 7am to 6pm - Pager - [phone]  After 6pm go to [email or web address] - password [email or web address] [email or web address]  Office  [phone]  CC: Primary care physician; Lorelee Market, MD

## 2015-08-21 NOTE — Telephone Encounter (Signed)
Squeezing pressure in upper back between shoulder blades radiates around to center of chest   Pt c/o of Chest Pain: STAT if CP now or developed within 24 hours  1. Are you having CP right now? Yes pressure   2. Are you experiencing any other symptoms (ex. SOB, nausea, vomiting, sweating)?  Interim nausea gas diarrhea very bloated   3. How long have you been experiencing CP?  Off and on since last night   4. Is your CP continuous or coming and going? Comes and goes   5. Have you taken Nitroglycerin? Yes, for a while but came back  ?

## 2015-08-21 NOTE — ED Notes (Signed)
Chest tightness since yesterday.  Been taking nitro for same.  Said her doctor is dr Fletcher Anon.

## 2015-08-21 NOTE — Telephone Encounter (Signed)
S/w pt who states she is having chest pain/pressure with radiating between shoulder blades.States it started last night. Took 3 nitro, able to sleep. Has taken one nitro today before calling us. Feels sweaty and light-headed.  N/V/Diarrhea . Pt feels it could be related to metformin  Feels bloated. Advised pt to go to ER now. Pt verbalized understanding and states she will go.

## 2015-08-21 NOTE — ED Provider Notes (Signed)
[facility] Emergency Department Provider Note  ____________________________________________  Time seen: 1643  I have reviewed the triage vital signs and the nursing notes.   HISTORY  Chief Complaint Chest Pain     HPI [name] [name] is a 34 y.o. female who had a myocardial infarction and subsequent cardiac catheterization with stenting this past spring. She presents the emergency department today with a complaint of tightness in her chest. She took 3 nitroglycerin last night with some relief. The tightness continued this morning and she took an additional nitroglycerin at 10 AM.  She denies chest tightness currently but reports she experienced some just 20 minutes prior to my evaluation while she was in the emergency department.  She also reports off and feeling cold, and having decreased exercise tolerance. She reports she gets short of breath and feels fatigued and breaks into a sweat when she is walking.  The patient does take a blood thinner and aspirin. She reports that her menstrual cycles have a heavier flow since starting these medications and the cycle lasts longer. She reports that she started bleeding on September 1 and still bleeding currently.     Past Medical History  Diagnosis Date  . CAD (coronary artery disease)     a. NSTEMI 03/2015: mLCx 100% s/p PCI/DES, RI 90% s/p PCI/DES, EF 35%; b. 04/2015 Relook Cath: LM nl, LAD 40p, RI patent stent, LCX patent stent, RCA nl, EF 55-65%.  . Chronic systolic CHF (congestive heart failure)     a. echo 03/2015: EF 30-35%, mild concentric LVH, severe HK of inf, inflat, & lat walls, mod MR, mild TR;  b. 04/2015 LV Gram: EF 55-65%.  . Ischemic cardiomyopathy     a. 03/2015 EF 30-35% post NSTEMI;  b. 04/2015 EF 55-65% on LV gram.  . DM2 (diabetes mellitus, type 2)     a. since 2002  . Tobacco abuse     a. quit 03/2015.  . Obesity   . Hyperlipidemia   . Hypotension     a. resulting in discontinuation of  losartan and reduction in coreg dose.  . MI (myocardial infarction)     Patient Active Problem List   Diagnosis Date Noted  . SOB (shortness of breath) 08/21/2015  . Chest pain 05/05/2015  . CAD in native artery 05/05/2015  . Pain in the chest   . Hyperlipidemia 04/13/2015  . CAD (coronary artery disease)   . Chronic systolic CHF (congestive heart failure)   . Ischemic cardiomyopathy   . DM2 (diabetes mellitus, type 2)   . Tobacco abuse   . Obesity     Past Surgical History  Procedure Laterality Date  . Mouth surgery    . Cardiac catheterization  4/16    x2 stent [facility]  . Cardiac catheterization N/A 05/05/2015    Procedure: Left Heart Cath and Coronary Angiography;  Surgeon: Peter M Martinique, MD;  Location: [facility];  Service: Cardiovascular;  Laterality: N/A;    Current Outpatient Rx  Name  Route  Sig  Dispense  Refill  . aspirin EC 81 MG tablet   Oral   Take 81 mg by mouth daily.         Marland Kitchen glimepiride (AMARYL) 4 MG tablet   Oral   Take 4 mg by mouth daily.          . metFORMIN (GLUCOPHAGE) 1000 MG tablet   Oral   Take 1,000 mg by mouth 2 (two) times daily.          Marland Kitchen  nitroGLYCERIN (NITROSTAT) 0.4 MG SL tablet   Sublingual   Place 0.4 mg under the tongue every 5 (five) minutes as needed for chest pain.         . simvastatin (ZOCOR) 20 MG tablet   Oral   Take 20 mg by mouth at bedtime.          . ticagrelor (BRILINTA) 90 MG TABS tablet   Oral   Take 90 mg by mouth 2 (two) times daily.           Allergies Lisinopril and Shellfish allergy  Family History  Problem Relation Age of Onset  . Diabetes type II      Social History Social History  Substance Use Topics  . Smoking status: Former Research scientist (life sciences)  . Smokeless tobacco: None  . Alcohol Use: No    Review of Systems  Constitutional: Negative for fever. Positive for exercise intolerance and dyspnea on exertion. ENT: Negative for sore throat. Cardiovascular: Positive for a cardiac  history and current chest tightness. See history of present illness. Respiratory: Positive for dyspnea on exertion. Gastrointestinal: Negative for abdominal pain, vomiting and diarrhea. Genitourinary: Negative for dysuria. Musculoskeletal: No myalgias or injuries. Skin: Negative for rash. Neurological: Negative for headaches   10-point ROS otherwise negative.  ____________________________________________   PHYSICAL EXAM:  VITAL SIGNS: ED Triage Vitals  Enc Vitals Group     BP 08/21/15 1306 116/71 mmHg     Pulse Rate 08/21/15 1306 105     Resp 08/21/15 1306 16     Temp 08/21/15 1306 98.5 F (36.9 C)     Temp Source 08/21/15 1306 Oral     SpO2 08/21/15 1306 100 %     Weight 08/21/15 1306 173 lb (78.472 kg)     Height 08/21/15 1306 5\' 4"  (1.626 m)     Head Cir --      Peak Flow --      Pain Score --      Pain Loc --      Pain Edu? --      Excl. in [place]? --     Constitutional:  Alert and oriented. Well appearing and in no distress. ENT   Head: Normocephalic and atraumatic.   Nose: No congestion/rhinnorhea.   Mouth/Throat: Mucous membranes are moist. Cardiovascular: Tachycardic at a rate of 105, regular rhythm, no murmur noted Respiratory:  Normal respiratory effort, no tachypnea.    Breath sounds are clear and equal bilaterally.  Gastrointestinal: Soft and nontender. No distention.  Back: No muscle spasm, no tenderness, no CVA tenderness. Musculoskeletal: No deformity noted. Nontender with normal range of motion in all extremities.  No noted edema. Neurologic:  Normal speech and language. No gross focal neurologic deficits are appreciated.  Skin:  Skin is warm, dry. No rash noted. Psychiatric: Mood and affect are normal. Speech and behavior are normal.  ____________________________________________    LABS (pertinent positives/negatives)  Labs Reviewed  BASIC METABOLIC PANEL - Abnormal; Notable for the following:    Glucose, Bld 120 (*)    Calcium 8.6 (*)     All other components within normal limits  CBC - Abnormal; Notable for the following:    RBC 3.43 (*)    Hemoglobin 7.6 (*)    HCT 24.1 (*)    MCV 70.4 (*)    MCH 22.2 (*)    MCHC 31.5 (*)    RDW 17.7 (*)    Platelets 475 (*)    All other components within normal limits  TROPONIN  I  TSH  TROPONIN I  TROPONIN I  TROPONIN I  IRON AND TIBC  FERRITIN  TYPE AND SCREEN  PREPARE RBC (CROSSMATCH)  PREPARE RBC (CROSSMATCH)  ABO/RH     ____________________________________________   EKG  ED ECG REPORT I, Alonnah Lampkins W, the attending physician, personally viewed and interpreted this ECG.   Date: 08/21/2015  EKG Time: 1301  Rate: 102  Rhythm: Sinus tachycardia  Axis: Normal  Intervals: Normal  ST&T Change: None noted   ____________________________________________    RADIOLOGY  FINDINGS: The heart size and mediastinal contours are within normal limits. Both lungs are clear. No pleural effusion or pneumothorax. The visualized skeletal structures are unremarkable.  IMPRESSION: No active cardiopulmonary disease.  ____________________________________________   PROCEDURES  Transfusion: Due to the patient's symptomatic anemia, including her chest tightness, exercise intolerance, dyspnea on exertion, we discussed with the patient transfusing 1 unit of blood. We discussed the risk and benefits. The patient consented to the transfusion.  CRITICAL CARE Performed by: Ahmed Prima   Total critical care time: 30 minutes due to the patient's symptomatic anemia with chest tightness and tachycardia. After I discussed with the patient the need for a transfusion, one was ordered by me. I spoke with Dr. Mariea Clonts and Dr. Posey Pronto regarding the care of this patient.  Critical care time was exclusive of separately billable procedures and treating other patients.  Critical care was necessary to treat or prevent imminent or life-threatening deterioration.  Critical care was time  spent personally by me on the following activities: development of treatment plan with patient and/or surrogate as well as nursing, discussions with consultants, evaluation of patient's response to treatment, examination of patient, obtaining history from patient or surrogate, ordering and performing treatments and interventions, ordering and review of laboratory studies, ordering and review of radiographic studies, pulse oximetry and re-evaluation of patient's condition.   ____________________________________________   INITIAL IMPRESSION / ASSESSMENT AND PLAN / ED COURSE  Pertinent labs & imaging results that were available during my care of the patient were reviewed by me and considered in my medical decision making (see chart for details).  34 year old female with a cardiac history and current chest tightness. She has exercise intolerance as well. Her blood count has dropped from 10.1 earlier this year to 8.1 in August to 7.6 today. This is likely due to heavy menstrual cycles secondary to anticoagulation.  I've spoken with Dr. Fletcher Anon, her cardiologist. He agreed with the transfusion and requested that we have her admitted to the hospital for further evaluation. He will see her tomorrow.  I discussed case with Dr. Posey Pronto who will evaluate the patient and arrange for her admission.  ____________________________________________   FINAL CLINICAL IMPRESSION(S) / ED DIAGNOSES  Final diagnoses:  SOB (shortness of breath)  Symptomatic anemia  Chest pain, unspecified chest pain type      Ahmed Prima, MD 08/21/15 [phone]

## 2015-08-22 ENCOUNTER — Encounter: Payer: Self-pay | Admitting: Nurse Practitioner

## 2015-08-22 DIAGNOSIS — I209 Angina pectoris, unspecified: Secondary | ICD-10-CM

## 2015-08-22 DIAGNOSIS — D649 Anemia, unspecified: Secondary | ICD-10-CM | POA: Diagnosis not present

## 2015-08-22 DIAGNOSIS — D509 Iron deficiency anemia, unspecified: Secondary | ICD-10-CM | POA: Diagnosis present

## 2015-08-22 DIAGNOSIS — R079 Chest pain, unspecified: Secondary | ICD-10-CM

## 2015-08-22 DIAGNOSIS — I25111 Atherosclerotic heart disease of native coronary artery with angina pectoris with documented spasm: Secondary | ICD-10-CM

## 2015-08-22 DIAGNOSIS — N920 Excessive and frequent menstruation with regular cycle: Secondary | ICD-10-CM | POA: Diagnosis present

## 2015-08-22 DIAGNOSIS — R0602 Shortness of breath: Secondary | ICD-10-CM

## 2015-08-22 LAB — CBC
HCT: 25.8 % — ABNORMAL LOW (ref 35.0–47.0)
Hemoglobin: 8.4 g/dL — ABNORMAL LOW (ref 12.0–16.0)
MCH: 23.5 pg — ABNORMAL LOW (ref 26.0–34.0)
MCHC: 32.4 g/dL (ref 32.0–36.0)
MCV: 72.7 fL — ABNORMAL LOW (ref 80.0–100.0)
Platelets: 411 10*3/uL (ref 150–440)
RBC: 3.55 MIL/uL — ABNORMAL LOW (ref 3.80–5.20)
RDW: 19.5 % — ABNORMAL HIGH (ref 11.5–14.5)
WBC: 9.3 10*3/uL (ref 3.6–11.0)

## 2015-08-22 LAB — TROPONIN I: Troponin I: <0.03 ng/mL (ref <0.031)

## 2015-08-22 LAB — GLUCOSE, CAPILLARY
Glucose-Capillary: 138 mg/dL — ABNORMAL HIGH (ref 65–99)
Glucose-Capillary: 184 mg/dL — ABNORMAL HIGH (ref 65–99)
Glucose-Capillary: 80 mg/dL (ref 65–99)

## 2015-08-22 LAB — TYPE AND SCREEN
ABO/RH(D): A POS
Antibody Screen: NEGATIVE
Unit division: 0

## 2015-08-22 LAB — HCG, QUANTITATIVE, PREGNANCY: hCG, Beta Chain, Quant, S: <1 m[IU]/mL (ref <5)

## 2015-08-22 MED ORDER — FERROUS SULFATE 325 (65 FE) MG PO TABS
325.0000 mg | ORAL_TABLET | Freq: Two times a day (BID) | ORAL | Status: DC
  Administered 2015-08-22: 325 mg via ORAL
  Filled 2015-08-22: qty 1

## 2015-08-22 MED ORDER — PANTOPRAZOLE SODIUM 40 MG PO TBEC
40.0000 mg | DELAYED_RELEASE_TABLET | Freq: Every day | ORAL | Status: DC
  Administered 2015-08-22: 40 mg via ORAL
  Filled 2015-08-22: qty 1

## 2015-08-22 MED ORDER — ATORVASTATIN CALCIUM 20 MG PO TABS
40.0000 mg | ORAL_TABLET | Freq: Every day | ORAL | Status: DC
  Administered 2015-08-22: 40 mg via ORAL
  Filled 2015-08-22: qty 2

## 2015-08-22 MED ORDER — ATORVASTATIN CALCIUM 40 MG PO TABS: 40.0000 mg | ORAL_TABLET | Freq: Every day | ORAL | Status: DC

## 2015-08-22 MED ORDER — PANTOPRAZOLE SODIUM 40 MG PO TBEC: 40.0000 mg | DELAYED_RELEASE_TABLET | Freq: Every day | ORAL | Status: DC

## 2015-08-22 MED ORDER — FERROUS SULFATE 325 (65 FE) MG PO TABS: 325.0000 mg | ORAL_TABLET | Freq: Three times a day (TID) | ORAL | Status: DC

## 2015-08-22 NOTE — Care Management (Signed)
There are o discharge needs identified.  Patient now has medicaid which she did not have on previous admission.  No issues accessing medical care or obtaining meds.  Very supportive family

## 2015-08-22 NOTE — Discharge Summary (Signed)
[facility] [facility] [place] NAME: [name] [name]    MR#:  [id number]  DATE OF BIRTH:  [birth year]  DATE OF ADMISSION:  08/21/2015 ADMITTING PHYSICIAN: Dustin Flock, MD  DATE OF DISCHARGE: 08/22/2015  PRIMARY CARE PHYSICIAN: Lorelee Market, MD    ADMISSION DIAGNOSIS:  SOB (shortness of breath) [R06.02] Symptomatic anemia [D64.9] Chest pain, unspecified chest pain type [R07.9]  DISCHARGE DIAGNOSIS:  Active Problems:   SOB (shortness of breath)   Iron deficiency anemia   Heavy menses  SECONDARY DIAGNOSIS:   Past Medical History  Diagnosis Date  . CAD (coronary artery disease)     a. NSTEMI 03/2015: mLCx 100% s/p PCI/DES, RI 90% s/p PCI/DES, EF 35%; b. 04/2015 Relook Cath: LM nl, LAD 40p, RI patent stent, LCX patent stent, RCA nl, EF 55-65%.  . Chronic systolic CHF (congestive heart failure)     a. echo 03/2015: EF 30-35%, mild concentric LVH, severe HK of inf, inflat, & lat walls, mod MR, mild TR;  b. 04/2015 LV Gram: EF 55-65%.  . Ischemic cardiomyopathy     a. 03/2015 EF 30-35% post NSTEMI;  b. 04/2015 EF 55-65% on LV gram.  . DM2 (diabetes mellitus, type 2)     a. since 2002  . Tobacco abuse     a. quit 03/2015.  . Obesity   . Hyperlipidemia   . Hypotension     a. resulting in discontinuation of losartan and reduction in coreg dose.  Marland Kitchen Heavy menses     a. H/O IUD - expired in 2014 - remains in place.  . Iron deficiency anemia     HOSPITAL COURSE:  1. Chest pressure shortness of breath likely related to anemia.   She was given 1 unit PRBC of transfusion.   Seen by a cardiologist, but he suggested no further cardiac workup.   Problem mainly her symptoms seems like secondary to her severe anemia, episodes of hypoglycemia which might be happening as she was on too many antidiabetic medications, and hypotension might be as a result of very strict fluid intake and running diarrhea causing her dehydration.   She also have some  complain of upper chest burning which sounds more like her reflux disease.   I stopped her metformin as that was giving her diarrhea and she was getting hypoglycemic also.   Continue glimepiride for now and advised to continue checking her blood sugar level and readjust the medications if needed.   Her blood pressure remained stable in the hospital.   She remained without any acute event on telemetry.   Her anemia is mainly secondary to her heavy menstruation which is addressed by OB/GYN doctor.  2. Iron deficiency anemia due to heavy menstruation    Started on iron supplementation therapy.   Spoke to GYN doctor, he suggested to discharge her and sent her to the clinic he is ready to give her appointment in one or 2 days.  3. Coronary artery disease continue aspirin and brilianta.  Not a candidate for beta blockers as her blood pressure is running lower normal side.  4. Diabetes type 2 continue glimepiride , stop metformin.  5. Hyperlipidemia  She was taking simvastatin, has history of coronary artery disease so I changed to high intensity atorvastatin, .   DISCHARGE CONDITIONS:   Stable.  CONSULTS OBTAINED:  Treatment Team:  Dustin Flock, MD Minna Merritts, MD  DRUG ALLERGIES:   Allergies  Allergen Reactions  . Lisinopril Itching and Swelling  .  Shellfish Allergy Itching and Swelling    DISCHARGE MEDICATIONS:   Current Discharge Medication List    START taking these medications   Details  atorvastatin (LIPITOR) 40 MG tablet Take 1 tablet (40 mg total) by mouth daily at 6 PM. Qty: 30 tablet, Refills: 0    ferrous sulfate 325 (65 FE) MG tablet Take 1 tablet (325 mg total) by mouth 3 (three) times daily with meals. Qty: 90 tablet, Refills: 3    pantoprazole (PROTONIX) 40 MG tablet Take 1 tablet (40 mg total) by mouth daily at 6 (six) AM. Qty: 30 tablet, Refills: 0      CONTINUE these medications which have NOT CHANGED   Details  aspirin EC 81 MG tablet Take  81 mg by mouth daily.    glimepiride (AMARYL) 4 MG tablet Take 4 mg by mouth daily.     nitroGLYCERIN (NITROSTAT) 0.4 MG SL tablet Place 0.4 mg under the tongue every 5 (five) minutes as needed for chest pain.    ticagrelor (BRILINTA) 90 MG TABS tablet Take 90 mg by mouth 2 (two) times daily.      STOP taking these medications     metFORMIN (GLUCOPHAGE) 1000 MG tablet      simvastatin (ZOCOR) 20 MG tablet          DISCHARGE INSTRUCTIONS:    Follow with cardiology clinic in 2 weeks.  If you experience worsening of your admission symptoms, develop shortness of breath, life threatening emergency, suicidal or homicidal thoughts you must seek medical attention immediately by calling 911 or calling your MD immediately  if symptoms less severe.  You Must read complete instructions/literature along with all the possible adverse reactions/side effects for all the Medicines you take and that have been prescribed to you. Take any new Medicines after you have completely understood and accept all the possible adverse reactions/side effects.   Please note  You were cared for by a hospitalist during your hospital stay. If you have any questions about your discharge medications or the care you received while you were in the hospital after you are discharged, you can call the unit and asked to speak with the hospitalist on call if the hospitalist that took care of you is not available. Once you are discharged, your primary care physician will handle any further medical issues. Please note that NO REFILLS for any discharge medications will be authorized once you are discharged, as it is imperative that you return to your primary care physician (or establish a relationship with a primary care physician if you do not have one) for your aftercare needs so that they can reassess your need for medications and monitor your lab values.    Today   CHIEF COMPLAINT:   Chief Complaint  Patient presents  with  . Chest Pain    HISTORY OF PRESENT ILLNESS:  [name]  is a 34 y.o. female with a known history of CAD with 2 stent who presents with complaint of chest pressure and dyspnea on exertion. She reports that the chest pressure is ongoing for the past few months. She also has dyspnea on exertion this progressively gotten worse. Patient does have history of heavy menstruations and has noted to have progressive anemia in the emergency room. Her hemoglobin is trended down to 7.6. Patient denies any nausea or vomiting or radiation of the pain. The emergency room physician discussed the case with patient's primary cardiologist.  VITAL SIGNS:  Blood pressure 138/84, pulse 95, temperature 99.1 F (  37.3 C), temperature source Oral, resp. rate 18, height 5\' 4"  (1.626 m), weight 77.248 kg (170 lb 4.8 oz), last menstrual period 08/10/2015, SpO2 100 %.  I/O:   Intake/Output Summary (Last 24 hours) at 08/22/15 1405 Last data filed at 08/22/15 0751  Gross per 24 hour  Intake 438.67 ml  Output      0 ml  Net 438.67 ml    PHYSICAL EXAMINATION:  GENERAL: 34 y.o.-year-old patient lying in the bed with no acute distress.  EYES: Pupils equal, round, reactive to light and accommodation. No scleral icterus. Extraocular muscles intact.  HEENT: Head atraumatic, normocephalic. Oropharynx and nasopharynx clear. conjunctivae pale. NECK: Supple, no jugular venous distention. No thyroid enlargement, no tenderness.  LUNGS: Normal breath sounds bilaterally, no wheezing, rales,rhonchi or crepitation. No use of accessory muscles of respiration.  CARDIOVASCULAR: S1, S2 normal. No murmurs, rubs, or gallops.  ABDOMEN: Soft, nontender, nondistended. Bowel sounds present. No organomegaly or mass.  EXTREMITIES: No pedal edema, cyanosis, or clubbing.  NEUROLOGIC: Cranial nerves II through XII are intact. Muscle strength 5/5 in all extremities. Sensation intact. Gait not checked.  PSYCHIATRIC: The patient is  alert and oriented x 3.  SKIN: No obvious rash, lesion, or ulcer.   DATA REVIEW:   CBC  Recent Labs Lab 08/22/15 0449  WBC 9.3  HGB 8.4*  HCT 25.8*  PLT 411    Chemistries   Recent Labs Lab 08/21/15 1310  NA 137  K 4.1  CL 108  CO2 23  GLUCOSE 120*  BUN 12  CREATININE 0.81  CALCIUM 8.6*    Cardiac Enzymes  Recent Labs Lab 08/22/15 0211  TROPONINI <0.03    Microbiology Results  Results for orders placed or performed during the hospital encounter of 05/05/15  MRSA PCR Screening     Status: None   Collection Time: 05/05/15  5:02 AM  Result Value Ref Range Status   MRSA by PCR NEGATIVE NEGATIVE Final    Comment:        The GeneXpert MRSA Assay (FDA approved for NASAL specimens only), is one component of a comprehensive MRSA colonization surveillance program. It is not intended to diagnose MRSA infection nor to guide or monitor treatment for MRSA infections.     RADIOLOGY:  Dg Chest 2 View  08/21/2015   CLINICAL DATA:  states she is having chest pain/pressure with radiating between shoulder blades.States it started last night. Took 3 nitro, able to sleep.Has taken one nitro today Feels sweaty and light-headed. N/V/Diarrhea . Pt feels it could be related to metformin Feels bloated. Hx of MI and cardiac cath this past may, hx of CAD, CHF, tobacco abuse, DM2  EXAM: CHEST  2 VIEW  COMPARISON:  05/05/2015  FINDINGS: The heart size and mediastinal contours are within normal limits. Both lungs are clear. No pleural effusion or pneumothorax. The visualized skeletal structures are unremarkable.  IMPRESSION: No active cardiopulmonary disease.   Electronically Signed   By: Lajean Manes M.D.   On: 08/21/2015 14:07      Management plans discussed with the patient, family and they are in agreement.  CODE STATUS:     Code Status Orders        Start     Ordered   08/21/15 1735  Full code   Continuous     08/21/15 1736      TOTAL TIME TAKING CARE OF THIS  PATIENT: 35 minutes.    Vaughan Basta M.D on 08/22/2015 at 2:05 PM  Between 7am  to 6pm - Pager - [phone]  After 6pm go to [email or web address] - password [email or web address] [facility]  Office  [phone]  CC: Primary care physician; Lorelee Market, MD

## 2015-08-22 NOTE — Discharge Instructions (Signed)
Follow with cardiology clinic in 2 weeks.

## 2015-08-22 NOTE — Consult Note (Signed)
CARDIOLOGY CONSULT NOTE   Patient ID: [name] [name] MRN: [id number], DOB/AGE: [birth year]   Admit date: 08/21/2015 Date of Consult: 08/22/2015   Primary Physician: Lorelee Market, MD Primary Cardiologist: Jerilynn Mages. Fletcher Anon, MD   Pt. Profile  34 y/o female with a h/o CAD s/p NSTEMI and DES to the LCX and RI, who presented to the ED yesterday 2/2 c/p and dyspnea.  Problem List  Past Medical History  Diagnosis Date  . CAD (coronary artery disease)     a. NSTEMI 03/2015: mLCx 100% s/p PCI/DES, RI 90% s/p PCI/DES, EF 35%; b. 04/2015 Relook Cath: LM nl, LAD 40p, RI patent stent, LCX patent stent, RCA nl, EF 55-65%.  . Chronic systolic CHF (congestive heart failure)     a. echo 03/2015: EF 30-35%, mild concentric LVH, severe HK of inf, inflat, & lat walls, mod MR, mild TR;  b. 04/2015 LV Gram: EF 55-65%.  . Ischemic cardiomyopathy     a. 03/2015 EF 30-35% post NSTEMI;  b. 04/2015 EF 55-65% on LV gram.  . DM2 (diabetes mellitus, type 2)     a. since 2002  . Tobacco abuse     a. quit 03/2015.  . Obesity   . Hyperlipidemia   . Hypotension     a. resulting in discontinuation of losartan and reduction in coreg dose.  Marland Kitchen Heavy menses     a. H/O IUD - expired in 2014 - remains in place.  . Iron deficiency anemia     Past Surgical History  Procedure Laterality Date  . Mouth surgery    . Cardiac catheterization  4/16    x2 stent [facility]  . Cardiac catheterization N/A 05/05/2015    Procedure: Left Heart Cath and Coronary Angiography;  Surgeon: Peter M Martinique, MD;  Location: [facility];  Service: Cardiovascular;  Laterality: N/A;     Allergies  Allergies  Allergen Reactions  . Lisinopril Itching and Swelling  . Shellfish Allergy Itching and Swelling    HPI   34 y/o female with the above problem list. She is s/p NSTEMI in 03/2015 with severe LCX and RI dzs s/p DES' to both locations. EF @ that time was 30-35%. She was d/c'd on coreg and losartan. She was readmitted to [facility] in late May  with c/p and r/o for MI. Cath revealed patent LCX and RI stents. EF had improved to 50-55% by LV gram.  As a result of hypotension with BP's in the 80's both BB and ARB therapy were discontinued.  She was readmitted to [facility] 1 month ago with c/o N, D, and c/p.  CE were negative.  She was seen by cardiology and c/p was felt to be atypical.  She was subsequently discharged with recommendation for outpt f/u.  Since her MI, she has had DOE and intermittent chest discomfort.  She has also noted more prolonged menses - extending up to 3 wks - since being placed on ASA and Brilinta.  She says that she always had a h/o heavy menses and that when her IUD was initially placed several years ago, it seemed to take care of that issue.  Since it expired about 2 years ago however, heavy menses returned and was subsequently exacerbated by the addition of ASA/Brilinta.  She says that she has also been prone to hypoglycemia in the setting of probable over-medication, improved diet, and wt loss.  Her PCP at one point placed her on 4 different diabetic agents.  She often experiences spells of dizziness  and presyncope, which she believes is related to hypoglycemia but may also be related to hypotension/orthostasis and relative dehydration in the setting of frequent diarrhea, which she attributes to having been on metformin 1000 mg BID (apparently d/c'd in August).  Yesterday, she called our office and complained of dyspnea with minimal exertion associated with chest discomfort and presyncope.  She was advised to present to the ED.  There, she was found to be tachycardic (sinus), though ECG was otw non-acute.  Trop was nl.  H/H were low @ 7.6/24.1.  She was admitted and transfused 1 U PRBC's.  She has felt much better since then.  Trops have remained nl and she has not had any recurrence of c/p or presyncope.  We have been asked to eval.  Inpatient Medications  . sodium chloride  10 mL/hr Intravenous Once  . aspirin EC  81 mg  Oral Daily  . glimepiride  4 mg Oral Daily  . insulin aspart  0-9 Units Subcutaneous TID WC  . metFORMIN  1,000 mg Oral BID  . simvastatin  20 mg Oral QHS  . sodium chloride  3 mL Intravenous Q12H  . ticagrelor  90 mg Oral BID   Family History Family History  Problem Relation Age of Onset  . Diabetes type II       Social History Social History   Social History  . Marital Status: Single    Spouse Name: N/A  . Number of Children: N/A  . Years of Education: N/A   Occupational History  . Not on file.   Social History Main Topics  . Smoking status: Former Smoker -- 15 years    Quit date: 03/10/2015  . Smokeless tobacco: Not on file  . Alcohol Use: No  . Drug Use: No  . Sexual Activity: Not on file   Other Topics Concern  . Not on file   Social History Narrative   Lives locally with daughter and fiance.     Review of Systems  General:  No chills, fever, night sweats or weight changes.  Cardiovascular:  +++ chest pain, +++dyspnea on exertion, edema, orthopnea, palpitations, paroxysmal nocturnal dyspnea. Dermatological: No rash, lesions/masses Respiratory: No cough, +++ dyspnea Urologic: No hematuria, dysuria Abdominal:   +++ nausea, no vomiting, +++ diarrhea, no bright red blood per rectum, melena, or hematemesis Neurologic:  Notes occasional blurring of vision associated with presyncope.  +++ wkns, no changes in mental status. All other systems reviewed and are otherwise negative except as noted above.  Physical Exam  Blood pressure 122/71, pulse 101, temperature 98.8 F (37.1 C), temperature source Oral, resp. rate 17, height 5\' 4"  (1.626 m), weight 170 lb 4.8 oz (77.248 kg), last menstrual period 08/10/2015, SpO2 100 %.  General: Pleasant, NAD Psych: Normal affect. Neuro: Alert and oriented X 3. Moves all extremities spontaneously. HEENT: Normal  Neck: Supple without bruits or JVD. Lungs:  Resp regular and unlabored, CTA. Heart: RRR no s3, s4, or  murmurs. Abdomen: Soft, non-tender, non-distended, BS + x 4.  Extremities: No clubbing, cyanosis or edema. DP/PT/Radials 2+ and equal bilaterally.  Labs   Recent Labs  08/21/15 1310 08/21/15 1858 08/22/15 0211  TROPONINI <0.03 <0.03 <0.03   Lab Results  Component Value Date   WBC 9.3 08/22/2015   HGB 8.4* 08/22/2015   HCT 25.8* 08/22/2015   MCV 72.7* 08/22/2015   PLT 411 08/22/2015     Recent Labs Lab 08/21/15 1310  NA 137  K 4.1  CL 108  CO2 23  BUN 12  CREATININE 0.81  CALCIUM 8.6*  GLUCOSE 120*   Lab Results  Component Value Date   CHOL 93* 05/16/2015   HDL 32* 05/16/2015   LDLCALC 46 05/16/2015   TRIG 73 05/16/2015    Lab Results  Component Value Date   IRON 25* 08/21/2015   TIBC 405 08/21/2015   FERRITIN 5* 08/21/2015     Radiology/Studies  Dg Chest 2 View  08/21/2015   CLINICAL DATA:  states she is having chest pain/pressure with radiating between shoulder blades.States it started last night. Took 3 nitro, able to sleep.Has taken one nitro today Feels sweaty and light-headed. N/V/Diarrhea . Pt feels it could be related to metformin Feels bloated. Hx of MI and cardiac cath this past may, hx of CAD, CHF, tobacco abuse, DM2  EXAM: CHEST  2 VIEW  COMPARISON:  05/05/2015  FINDINGS: The heart size and mediastinal contours are within normal limits. Both lungs are clear. No pleural effusion or pneumothorax. The visualized skeletal structures are unremarkable.  IMPRESSION: No active cardiopulmonary disease.   Electronically Signed   By: Lajean Manes M.D.   On: 08/21/2015 14:07   ECG  ST, 102, LAE, poor R progression.  ASSESSMENT AND PLAN  1.  Midsternal chest pain with dyspnea/CAD Pt with a h/o CAD s/p NSTEMI and DES to the LCX and RI in 03/2015 with patent stents on relook cath in 04/2015.  She has had some degree of exertional c/p and dyspnea since, initially felt to be r/t deconditioning.  She presented to the ED on 9/12 with recurrent Ss and has been  found to be anemic with an H/H of 7.6/24.1.  ECG non-acute. Troponins wnl. No further Ss following 1U prbc's. - Suspect demand ischemia in the setting of iron deficiency anemia and heavy menses. - Recommend aggressive mgmt of IDA and OB-GYN referral (inpt vs outpt) to determine cause of heavy menses/DUB and consider initiation of a progesterone only oral contraceptive in an effort to limit her menses and thus reduce her burden of anemia, especially as she will need to remain on ASA/Brilinta at least until 03/2016.  She does have an expired IUD in place. - Cont ASA/Brilinta/Statin Rx. - No BB 2/2 h/o fairly profound hypotension. - No further cardiac testing @ this time. - Add PPI given report of "sour stomach" with nausea and poor appetite.  2.  Iron Deficiency Anemia In setting of heavy menses.   Serum iron 25 with Ferritin of 5. Feeling better following transfusion. - Add oral Iron. - GYN referral as above.  3.  Heavy Menses/Dysfunctional uterine bleeding See #1.  Periods have been lasting up to 3 wks out of the month since initiation of ASA/Brilinta. - Recommend OB-GYN referral (inpt vs outpt) as above.  4.  Presyncope/Orthostasis Pt has a h/o hypotension that previously resulted in discontinuation of bb and ARB therapy following her NSTEMI.  BP's currently stable though she reports frequent episodes of presyncope while standing. - As her LV fxn normalized by her last echo in 04/2015, we have advised that she liberalize her salt intake up to 3 G per day. - She will also potentially benefit from the addition of knee-high compression hose. - Avoiding anemia will be important. - Managing diarrhea will also be important as this is contributing to dehydration and hypotension.  She believes her diabetes meds were contributing to her diarrhea.  5.  Type II DM She has been on multiple meds over the past several months -  mostly in the form of samples.  She does not appear to tolerate metformin  2/2 nausea and diarrhea, and has been off of it for the past month. She is currently tolerating amaryl, though will need a refill on it at discharge. - She has f/u @ [facility] next Wednesday.  6.  H/O ICM/Chronic systolic CHF EF normalized per echo 04/2015. No evidence of volume overload. - Unfortunately, due to relative hypotension with orthostasis, she has not tolerated BB/ARB therapy.  7.  Tobacco Abuse She quit in 03/2015.   Signed, Murray Hodgkins, NP 08/22/2015, 9:40 AM

## 2015-08-22 NOTE — Progress Notes (Signed)
Pt had 8 beat run of vtach, asymptomatic, VSS, dr. Rockey Situ made aware, no new orders, will continue to assess

## 2015-08-22 NOTE — Progress Notes (Signed)
Pt is a&o, VSS, NSr on tele with no complaint of pain or discomfort. Consult to OBGYN in which pt went to [facility]. Per MD place order for preg test then pt good for D/C all else will be followed out pt. Orders to D/C pt to home. Discharge instructions given to pt with verbal acknowledgment of understanding. Pt escorted off unit via wheelchair by nursing.

## 2015-08-23 ENCOUNTER — Encounter: Payer: Self-pay | Admitting: *Deleted

## 2015-08-23 ENCOUNTER — Telehealth: Payer: Self-pay | Admitting: *Deleted

## 2015-08-23 DIAGNOSIS — I2510 Atherosclerotic heart disease of native coronary artery without angina pectoris: Secondary | ICD-10-CM | POA: Insufficient documentation

## 2015-08-23 DIAGNOSIS — I252 Old myocardial infarction: Secondary | ICD-10-CM

## 2015-08-23 DIAGNOSIS — Z95820 Peripheral vascular angioplasty status with implants and grafts: Secondary | ICD-10-CM

## 2015-08-23 DIAGNOSIS — I209 Angina pectoris, unspecified: Secondary | ICD-10-CM | POA: Insufficient documentation

## 2015-08-23 LAB — PREPARE RBC (CROSSMATCH)

## 2015-08-23 LAB — GLUCOSE, CAPILLARY: Glucose-Capillary: 144 mg/dL — ABNORMAL HIGH (ref 65–99)

## 2015-08-23 NOTE — Telephone Encounter (Signed)
Spoke with [name] [name] today. She was discharged from [facility] yesterday after a blood transfusion . She has appt on 21st with MD. Told her to get release to return from [facility] when she sees doc otor on the 21st.   She is still having car problems and hopes this will be resolved by then.

## 2015-08-23 NOTE — Progress Notes (Signed)
Cardiac Individual Treatment Plan  Patient Details  Name: [name] [name] MRN: [id number] Date of Birth: [birth year] Referring Provider:  Wellington Hampshire, MD  Initial Encounter Date:    Visit Diagnosis: Status post non-ST elevation myocardial infarction (NSTEMI)  S/P angioplasty with stent  Patient's Home Medications on Admission:  Current outpatient prescriptions:  .  aspirin EC 81 MG tablet, Take 81 mg by mouth daily., Disp: , Rfl:  .  atorvastatin (LIPITOR) 40 MG tablet, Take 1 tablet (40 mg total) by mouth daily at 6 PM., Disp: 30 tablet, Rfl: 0 .  ferrous sulfate 325 (65 FE) MG tablet, Take 1 tablet (325 mg total) by mouth 3 (three) times daily with meals., Disp: 90 tablet, Rfl: 3 .  glimepiride (AMARYL) 4 MG tablet, Take 4 mg by mouth daily. , Disp: , Rfl:  .  nitroGLYCERIN (NITROSTAT) 0.4 MG SL tablet, Place 0.4 mg under the tongue every 5 (five) minutes as needed for chest pain., Disp: , Rfl:  .  pantoprazole (PROTONIX) 40 MG tablet, Take 1 tablet (40 mg total) by mouth daily at 6 (six) AM., Disp: 30 tablet, Rfl: 0 .  ticagrelor (BRILINTA) 90 MG TABS tablet, Take 90 mg by mouth 2 (two) times daily., Disp: , Rfl:   Past Medical History: Past Medical History  Diagnosis Date  . CAD (coronary artery disease)     a. NSTEMI 03/2015: mLCx 100% s/p PCI/DES, RI 90% s/p PCI/DES, EF 35%; b. 04/2015 Relook Cath: LM nl, LAD 40p, RI patent stent, LCX patent stent, RCA nl, EF 55-65%.  . Chronic systolic CHF (congestive heart failure)     a. echo 03/2015: EF 30-35%, mild concentric LVH, severe HK of inf, inflat, & lat walls, mod MR, mild TR;  b. 04/2015 LV Gram: EF 55-65%.  . Ischemic cardiomyopathy     a. 03/2015 EF 30-35% post NSTEMI;  b. 04/2015 EF 55-65% on LV gram.  . DM2 (diabetes mellitus, type 2)     a. since 2002  . Tobacco abuse     a. quit 03/2015.  . Obesity   . Hyperlipidemia   . Hypotension     a. resulting in discontinuation of losartan and reduction in coreg dose.  Marland Kitchen Heavy  menses     a. H/O IUD - expired in 2014 - remains in place.  . Iron deficiency anemia     Tobacco Use: History  Smoking status  . Former Smoker -- 15 years  . Quit date: 03/10/2015  Smokeless tobacco  . Not on file    Labs: Recent Review Flowsheet Data    Labs for ITP Cardiac and Pulmonary Rehab Latest Ref Rng 03/31/2015 05/05/2015 05/16/2015   Cholestrol 100 - 199 mg/dL 133 - 93(L)   LDLCALC 0 - 99 mg/dL 92 - 46   HDL >39 mg/dL 26(L) - 32(L)   Trlycerides 0 - 149 mg/dL 77 - 73   Hemoglobin A1c 4.8 - 5.6 % 14.5(H) 14.2(H) -         POCT Glucose      07/24/15 1406           POCT Blood Glucose   Pre-Exercise 158 mg/dL          Exercise Target Goals:    Exercise Program Goal: Individual exercise prescription set with THRR, safety & activity barriers. Participant demonstrates ability to understand and report RPE using BORG scale, to self-measure pulse accurately, and to acknowledge the importance of the exercise prescription.  Exercise Prescription Goal: Starting with  aerobic activity 30 plus minutes a day, 3 days per week for initial exercise prescription. Provide home exercise prescription and guidelines that participant acknowledges understanding prior to discharge.  Activity Barriers & Risk Stratification:     Activity Barriers & Risk Stratification - 07/24/15 1933    Activity Barriers & Risk Stratification   Activity Barriers None   Risk Stratification High      6 Minute Walk:     6 Minute Walk      07/24/15 1546       6 Minute Walk   Phase Initial     Distance 326 feet  NS test: 326 steps     Walk Time 6 minutes     Resting HR 116 bpm     Resting BP 100/70 mmHg     Max Ex. HR 111 bpm     Max Ex. BP 110/60 mmHg     RPE 7     Symptoms No        Initial Exercise Prescription:     Initial Exercise Prescription - 07/24/15 1500    Date of Initial Exercise Prescription   Date 07/24/15   Bike   Level 1   Minutes 15   Recumbant Bike   Level  3   RPM 40   Watts 30   Minutes 15   NuStep   Level 3   Watts 40   Minutes 15   Arm Ergometer   Level 1   Watts 10   Minutes 15   Arm/Foot Ergometer   Level 2   Watts 15   Minutes 15   Cybex   Level 2   RPM 40   Minutes 15   Recumbant Elliptical   Level 1   RPM 30   Watts 20   Minutes 15   REL-XR   Level 3   Watts 30   Minutes 15   Prescription Details   Frequency (times per week) 3   Duration Progress to 30 minutes of continuous aerobic without signs/symptoms of physical distress   Intensity   THRR REST +  20   Ratings of Perceived Exertion 11-15   Progression Continue progressive overload as per policy without signs/symptoms or physical distress.   Resistance Training   Training Prescription Yes   Weight 2   Reps 10-12      Exercise Prescription Changes:     Exercise Prescription Changes      08/02/15 1700 08/21/15 0600         Exercise Review   Progression Yes Yes      Response to Exercise   Blood Pressure (Admit)  112/62 mmHg      Blood Pressure (Exercise)  124/60 mmHg      Blood Pressure (Exit)  104/68 mmHg      Heart Rate (Admit)  69 bpm      Heart Rate (Exercise)  121 bpm      Heart Rate (Exit)  108 bpm      Rating of Perceived Exertion (Exercise)  13      Symptoms Some nausea        Duration Progress to 30 minutes of continuous aerobic without signs/symptoms of physical distress Progress to 30 minutes of continuous aerobic without signs/symptoms of physical distress      Intensity Rest + 30 Rest + 30      Progression Continue progressive overload as per policy without signs/symptoms or physical distress. Continue progressive overload as per policy without signs/symptoms or  physical distress.      Resistance Training   Training Prescription Yes Yes      Weight 2 2      Reps 10-15 10-15      Interval Training   Interval Training No No      NuStep   Level  3      Watts  40      Minutes  15      Recumbant Elliptical   Level 3 3      RPM  40 40      Minutes 15 15      REL-XR   Level  3      Watts  55      Minutes  15         Discharge Exercise Prescription (Final Exercise Prescription Changes):     Exercise Prescription Changes - 08/21/15 0600    Exercise Review   Progression Yes   Response to Exercise   Blood Pressure (Admit) 112/62 mmHg   Blood Pressure (Exercise) 124/60 mmHg   Blood Pressure (Exit) 104/68 mmHg   Heart Rate (Admit) 69 bpm   Heart Rate (Exercise) 121 bpm   Heart Rate (Exit) 108 bpm   Rating of Perceived Exertion (Exercise) 13   Duration Progress to 30 minutes of continuous aerobic without signs/symptoms of physical distress   Intensity Rest + 30   Progression Continue progressive overload as per policy without signs/symptoms or physical distress.   Resistance Training   Training Prescription Yes   Weight 2   Reps 10-15   Interval Training   Interval Training No   NuStep   Level 3   Watts 40   Minutes 15   Recumbant Elliptical   Level 3   RPM 40   Minutes 15   REL-XR   Level 3   Watts 55   Minutes 15      Nutrition:  Target Goals: Understanding of nutrition guidelines, daily intake of sodium <1564m, cholesterol <2019m calories 30% from fat and 7% or less from saturated fats, daily to have 5 or more servings of fruits and vegetables.  Biometrics:     Pre Biometrics - 07/24/15 1554    Pre Biometrics   Height 5' 5.8" (1.671 m)   Weight 169 lb 1.6 oz (76.703 kg)   Waist Circumference 34.5 inches   Hip Circumference 40 inches   Waist to Hip Ratio 0.86 %   BMI (Calculated) 27.5       Nutrition Therapy Plan and Nutrition Goals:     Nutrition Therapy & Goals - 07/24/15 1939    Nutrition Therapy   Drug/Food Interactions Statins/Certain Fruits   Intervention Plan   Intervention Using nutrition plan and personal goals to gain a healthy nutrition lifestyle. Add exercise as prescribed.      Nutrition Discharge: Rate Your Plate Scores:   Nutrition Goals  Re-Evaluation:   Psychosocial: Target Goals: Acknowledge presence or absence of depression, maximize coping skills, provide positive support system. Participant is able to verbalize types and ability to use techniques and skills needed for reducing stress and depression.  Initial Review & Psychosocial Screening:     Initial Psych Review & Screening - 07/24/15 1941    Initial Review   Current issues with Current Anxiety/Panic;Current Stress Concerns   Family Dynamics   Good Support System? Yes   Barriers   Psychosocial barriers to participate in program There are no identifiable barriers or psychosocial needs.;The patient should benefit from training in  stress management and relaxation.   Screening Interventions   Interventions Encouraged to exercise;Program counselor consult      Quality of Life Scores:     Quality of Life - 07/24/15 2014    Quality of Life Scores   Health/Function Pre 12.9 %   Socioeconomic Pre 13.36 %   Psych/Spiritual Pre 12.64 %   Family Pre 15.5 %   GLOBAL Pre 13.32 %      PHQ-9:     Recent Review Flowsheet Data    Depression screen [facility] 2/9 07/24/2015   Decreased Interest 2   Down, Depressed, Hopeless 2   PHQ - 2 Score 4   Altered sleeping 3   Tired, decreased energy 1   Change in appetite 1   Feeling bad or failure about yourself  2   Trouble concentrating 0   Moving slowly or fidgety/restless 1   Suicidal thoughts 1   PHQ-9 Score 13   Difficult doing work/chores Somewhat difficult      Psychosocial Evaluation and Intervention:     Psychosocial Evaluation - 07/26/15 1706    Psychosocial Evaluation & Interventions   Interventions Stress management education;Relaxation education;Encouraged to exercise with the program and follow exercise prescription;Physician referral   Comments Counselor met with Ms. Shockley today for initial psychosocial evaluation.  She is a 34 year old who reports having a heart attack this past April and is also a  type 2 Diabetic.  Ms. B has a strong support system living with her sister, a very helpful 62 year old daughter and a fiance who lives close by.  Ms. B states she is struggling with her medications currently that are causing a great deal of GI issues as well as sweating, dizziness, low blood sugars, blurred vision and she is also having difficulty sleeping at night - stating possibly 5 hours/night on a good night - falling asleep around 7AM and sleeping until mid-day.  Ms. B also reports a history of anxiety and panic symptoms for the past 6 years -never fully diagnosed or treated.  She admits to some irritability symptoms as well with some depressive symptoms currently.  Therapist provided Ms. B with information to see a psychiatrist for a medication evaluation for these mental health symptoms.  She is encouraged to continue speaking with her current Drs about the medication side effects that are causing the other negative symptoms.  Ms. B has goals to get healthier and increase her stamina and strength while in this program.  Counselor will follow up with her concerning these recommendations.   Continued Psychosocial Services Needed Yes  Ms. B is recommended to see a psychiatrist for a medication evaluation due to a history of untreated panic atttacks and some current symptoms.  She will also benefit from the psychoeducational components of this program as well as consistent exercise.      Psychosocial Re-Evaluation:     Psychosocial Re-Evaluation      07/31/15 1706           Psychosocial Re-Evaluation   Comments Follow up with Ms. B today reporting she had an initial assessment with the psychiatric group but will not see them again until after she sees her Dr. for possible medications for depressive and anxiety symptoms.  Counselor assessed sleep with Ms. B continuing to report difficulty getting to sleep is ongoing.  Counselor suggested talking with Pharmacist or doctor about an OTC natural sleep  aid to see if this might help in the meantime until the psychiatrist  is able to collaborate with her PCP.  Counselor will continue to follow with Ms. B. while in this program.            Vocational Rehabilitation: Provide vocational rehab assistance to qualifying candidates.   Vocational Rehab Evaluation & Intervention:     Vocational Rehab - 07/24/15 1934    Initial Vocational Rehab Evaluation & Intervention   Assessment shows need for Vocational Rehabilitation No      Education: Education Goals: Education classes will be provided on a weekly basis, covering required topics. Participant will state understanding/return demonstration of topics presented.  Learning Barriers/Preferences:     Learning Barriers/Preferences - 07/24/15 1933    Learning Barriers/Preferences   Learning Barriers Sight   Learning Preferences Written Material      Education Topics: General Nutrition Guidelines/Fats and Fiber: -Group instruction provided by verbal, written material, models and posters to present the general guidelines for heart healthy nutrition. Gives an explanation and review of dietary fats and fiber.   Controlling Sodium/Reading Food Labels: -Group verbal and written material supporting the discussion of sodium use in heart healthy nutrition. Review and explanation with models, verbal and written materials for utilization of the food label.          Cardiac Rehab from 08/02/2015 in [facility] Cardiac Rehab   Date  07/31/15   Educator  PI   Instruction Review Code  2- meets goals/outcomes      Exercise Physiology & Risk Factors: - Group verbal and written instruction with models to review the exercise physiology of the cardiovascular system and associated critical values. Details cardiovascular disease risk factors and the goals associated with each risk factor.   Aerobic Exercise & Resistance Training: - Gives group verbal and written discussion on the health impact of inactivity.  On the components of aerobic and resistive training programs and the benefits of this training and how to safely progress through these programs.   Flexibility, Balance, General Exercise Guidelines: - Provides group verbal and written instruction on the benefits of flexibility and balance training programs. Provides general exercise guidelines with specific guidelines to those with heart or lung disease. Demonstration and skill practice provided.   Stress Management: - Provides group verbal and written instruction about the health risks of elevated stress, cause of high stress, and healthy ways to reduce stress.   Depression: - Provides group verbal and written instruction on the correlation between heart/lung disease and depressed mood, treatment options, and the stigmas associated with seeking treatment.      Cardiac Rehab from 08/02/2015 in [facility] Cardiac Rehab   Date  08/02/15   Educator  [facility]   Instruction Review Code  2- meets goals/outcomes      Anatomy & Physiology of the Heart: - Group verbal and written instruction and models provide basic cardiac anatomy and physiology, with the coronary electrical and arterial systems. Review of: AMI, Angina, Valve disease, Heart Failure, Cardiac Arrhythmia, Pacemakers, and the ICD.   Cardiac Procedures: - Group verbal and written instruction and models to describe the testing methods done to diagnose heart disease. Reviews the outcomes of the test results. Describes the treatment choices: Medical Management, Angioplasty, or Coronary Bypass Surgery.   Cardiac Medications: - Group verbal and written instruction to review commonly prescribed medications for heart disease. Reviews the medication, class of the drug, and side effects. Includes the steps to properly store meds and maintain the prescription regimen.   Go Sex-Intimacy & Heart Disease, Get SMART - Goal Setting: -  Group verbal and written instruction through game format to discuss heart  disease and the return to sexual intimacy. Provides group verbal and written material to discuss and apply goal setting through the application of the S.M.A.R.T. Method.   Other Matters of the Heart: - Provides group verbal, written materials and models to describe Heart Failure, Angina, Valve Disease, and Diabetes in the realm of heart disease. Includes description of the disease process and treatment options available to the cardiac patient.   Exercise & Equipment Safety: - Individual verbal instruction and demonstration of equipment use and safety with use of the equipment.      Cardiac Rehab from 08/02/2015 in [facility] Cardiac Rehab   Date  07/24/15   Educator  C. Enterkin,RN   Instruction Review Code  1- partially meets, needs review/practice      Infection Prevention: - Provides verbal and written material to individual with discussion of infection control including proper hand washing and proper equipment cleaning during exercise session.      Cardiac Rehab from 08/02/2015 in [facility] Cardiac Rehab   Date  07/24/15   Educator  Jimmie Molly   Instruction Review Code  2- meets goals/outcomes      Falls Prevention: - Provides verbal and written material to individual with discussion of falls prevention and safety.      Cardiac Rehab from 08/02/2015 in [facility] Cardiac Rehab   Date  07/24/15   Educator  C. Enterkin,RN   Instruction Review Code  2- meets goals/outcomes      Diabetes: - Individual verbal and written instruction to review signs/symptoms of diabetes, desired ranges of glucose level fasting, after meals and with exercise. Advice that pre and post exercise glucose checks will be done for 3 sessions at entry of program.      Cardiac Rehab from 08/02/2015 in [facility] Cardiac Rehab   Date  07/24/15   Educator  C. Enterkin,RN   Instruction Review Code  1- partially meets, needs review/practice       Knowledge Questionnaire Score:     Knowledge Questionnaire Score - 07/24/15 1933     Knowledge Questionnaire Score   Pre Score 24      Personal Goals and Risk Factors at Admission:     Personal Goals and Risk Factors at Admission - 07/24/15 1939    Personal Goals and Risk Factors on Admission    Weight Management Yes   Intervention Learn and follow the exercise and diet guidelines while in the program. Utilize the nutrition and education classes to help gain knowledge of the diet and exercise expectations in the program   Increase Aerobic Exercise and Physical Activity Yes   Intervention While in program, learn and follow the exercise prescription taught. Start at a low level workload and increase workload after able to maintain previous level for 30 minutes. Increase time before increasing intensity.   Take Less Medication Yes   Intervention Learn your risk factors and begin the lifestyle modifications for risk factor control during your time in the program.   Understand more about Heart/Pulmonary Disease. Yes   Intervention While in program utilize professionals for any questions, and attend the education sessions. Great websites to use are www.americanheart.org or www.lung.org for reliable information.   Diabetes Yes   Goal Blood glucose control identified by blood glucose values, HgbA1C. Participant verbalizes understanding of the signs/symptoms of hyper/hypo glycemia, proper foot care and importance of medication and nutrition plan for blood glucose control.   Intervention Provide nutrition & aerobic exercise  along with prescribed medications to achieve blood glucose in normal ranges: Fasting 65-99 mg/dL   Hypertension Yes   Goal Participant will see blood pressure controlled within the values of 140/35m/Hg or within value directed by their physician.   Intervention Provide nutrition & aerobic exercise along with prescribed medications to achieve BP 140/90 or less.   Lipids Yes   Goal Cholesterol controlled with medications as prescribed, with individualized  exercise RX and with personalized nutrition plan. Value goals: LDL < 738m HDL > 4064mParticipant states understanding of desired cholesterol values and following prescriptions.   Intervention Provide nutrition & aerobic exercise along with prescribed medications to achieve LDL <35m49mDL >40mg77mStress Yes   Goal To meet with psychosocial counselor for stress and relaxation information and guidance. To state understanding of performing relaxation techniques and or identifying personal stressors.   Intervention Provide education on types of stress, identifiying stressors, and ways to cope with stress. Provide demonstration and active practice of relaxation techniques.      Personal Goals and Risk Factors Review:    Personal Goals Discharge:     Comments: 30 day review  Continue with current ITP. ReneeJoseph Artbeen out with car trouble since 8/24. She ws ercently admittd and discharged from [facility] anemia/requiring a blood transfusion. She will have follow up MD appointment on the 21st. Will ask for release to return to Cardiac Rehab when she sees her doctor.

## 2015-08-30 ENCOUNTER — Ambulatory Visit (INDEPENDENT_AMBULATORY_CARE_PROVIDER_SITE_OTHER): Payer: Medicaid Other | Admitting: Unknown Physician Specialty

## 2015-08-30 ENCOUNTER — Encounter: Payer: Self-pay | Admitting: Unknown Physician Specialty

## 2015-08-30 VITALS — BP 140/86 | HR 100 | Temp 98.9°F | Ht 65.5 in | Wt 175.8 lb

## 2015-08-30 DIAGNOSIS — I2510 Atherosclerotic heart disease of native coronary artery without angina pectoris: Secondary | ICD-10-CM

## 2015-08-30 DIAGNOSIS — E118 Type 2 diabetes mellitus with unspecified complications: Secondary | ICD-10-CM | POA: Diagnosis not present

## 2015-08-30 DIAGNOSIS — D509 Iron deficiency anemia, unspecified: Secondary | ICD-10-CM | POA: Diagnosis not present

## 2015-08-30 DIAGNOSIS — I25111 Atherosclerotic heart disease of native coronary artery with angina pectoris with documented spasm: Secondary | ICD-10-CM

## 2015-08-30 DIAGNOSIS — R809 Proteinuria, unspecified: Secondary | ICD-10-CM | POA: Diagnosis not present

## 2015-08-30 DIAGNOSIS — Z23 Encounter for immunization: Secondary | ICD-10-CM | POA: Diagnosis not present

## 2015-08-30 DIAGNOSIS — N921 Excessive and frequent menstruation with irregular cycle: Secondary | ICD-10-CM

## 2015-08-30 DIAGNOSIS — E1129 Type 2 diabetes mellitus with other diabetic kidney complication: Secondary | ICD-10-CM | POA: Insufficient documentation

## 2015-08-30 LAB — MICROALBUMIN, URINE WAIVED
Creatinine, Urine Waived: 200 mg/dL (ref 10–300)
Microalb, Ur Waived: 150 mg/L — ABNORMAL HIGH (ref 0–19)

## 2015-08-30 LAB — CBC WITH DIFFERENTIAL/PLATELET
Hematocrit: 27.7 % — ABNORMAL LOW (ref 34.0–46.6)
Hemoglobin: 8.7 g/dL — ABNORMAL LOW (ref 11.1–15.9)
Lymphocytes Absolute: 2.3 10*3/uL (ref 0.7–3.1)
Lymphs: 23 %
MCH: 23.6 pg — ABNORMAL LOW (ref 26.6–33.0)
MCHC: 31.4 g/dL — ABNORMAL LOW (ref 31.5–35.7)
MCV: 75 fL — ABNORMAL LOW (ref 79–97)
MID (Absolute): 2 10*3/uL — ABNORMAL HIGH (ref 0.1–1.6)
MID: 20 %
Neutrophils Absolute: 5.9 10*3/uL (ref 1.4–7.0)
Neutrophils: 58 %
Platelets: 483 10*3/uL — ABNORMAL HIGH (ref 150–379)
RBC: 3.69 x10E6/uL — ABNORMAL LOW (ref 3.77–5.28)
RDW: 20.1 % — ABNORMAL HIGH (ref 12.3–15.4)
WBC: 10.2 10*3/uL (ref 3.4–10.8)

## 2015-08-30 LAB — LIPID PANEL PICCOLO, WAIVED
Chol/HDL Ratio Piccolo,Waive: 2.4 mg/dL
Cholesterol Piccolo, Waived: 106 mg/dL (ref <200)
HDL Chol Piccolo, Waived: 45 mg/dL — ABNORMAL LOW (ref >59)
LDL Chol Calc Piccolo Waived: 52 mg/dL (ref <100)
Triglycerides Piccolo,Waived: 46 mg/dL (ref <150)
VLDL Chol Calc Piccolo,Waive: 9 mg/dL (ref <30)

## 2015-08-30 LAB — BAYER DCA HB A1C WAIVED: HB A1C (BAYER DCA - WAIVED): 6.8 % (ref <7.0)

## 2015-08-30 MED ORDER — LOSARTAN POTASSIUM 50 MG PO TABS: 50.0000 mg | ORAL_TABLET | Freq: Every day | ORAL | Status: DC

## 2015-08-30 NOTE — Assessment & Plan Note (Signed)
Cholesterol stable.  Per Dr. Fletcher Anon

## 2015-08-30 NOTE — Assessment & Plan Note (Addendum)
Hgb A1C is 6.8.  Stable, continue present medications.   Refer for diabetes education

## 2015-08-30 NOTE — Progress Notes (Signed)
BP 140/86 mmHg  Pulse 100  Temp(Src) 98.9 F (37.2 C)  Ht 5' 5.5" (1.664 m)  Wt 175 lb 12.8 oz (79.742 kg)  BMI 28.80 kg/m2  SpO2 100%  LMP 08/10/2015   Subjective:    Patient ID: [name] [name], female    DOB: 1981/07/12, 34 y.o.   MRN: [id number]  HPI: [name] [name] is a 34 y.o. female  Chief Complaint  Patient presents with  . Establish Care   Pt is here to establish care and management of multiple medical problems   Anemia Pt went to the ER this month due to feeling faint.  She was found to be anemic, labs are below.  She is seeing Dr. Elmer Bales due to heavy periods.  Got blood in the ER.  She is better but still fatigued and sleepy  Diabetes Diabetes visit type: Diagnosed in 2002 and on oral medications. She has type 2 diabetes mellitus. Hypoglycemia symptoms include confusion. Pertinent negatives for diabetes include no chest pain. There are no hypoglycemic complications. Symptoms are stable. Diabetic complications include heart disease. She is compliant with treatment all of the time. Her weight is increasing steadily. She is following a generally healthy diet. She monitors blood glucose at home 3-4 x per day. Her breakfast blood glucose range is generally 90-110 mg/dl. Her lunch blood glucose range is generally 140-180 mg/dl. Her dinner blood glucose range is generally 180-200 mg/dl.  Hypertension This is a chronic (taken off the Losartan and Coreg) problem. The problem is controlled. Pertinent negatives include no anxiety, chest pain, palpitations or shortness of breath.   CAD Sees Dr Fletcher Anon.  Had 2 stents.  She is currently stable.      Relevant past medical, surgical, family and social history reviewed and updated as indicated. Interim medical history since our last visit reviewed.  Past family history significant for DM in father Allergies and medications reviewed and updated.  Review of Systems  Constitutional: Negative.   HENT: Negative.   Eyes: Positive  for discharge.  Respiratory: Negative.  Negative for shortness of breath.   Cardiovascular: Negative.  Negative for chest pain and palpitations.  Gastrointestinal: Positive for nausea, abdominal pain, diarrhea and abdominal distention.  Genitourinary: Negative.   Skin: Negative.   Neurological: Negative.   Psychiatric/Behavioral: Positive for confusion.    Per HPI unless specifically indicated above     Objective:    BP 140/86 mmHg  Pulse 100  Temp(Src) 98.9 F (37.2 C)  Ht 5' 5.5" (1.664 m)  Wt 175 lb 12.8 oz (79.742 kg)  BMI 28.80 kg/m2  SpO2 100%  LMP 08/10/2015  Wt Readings from Last 3 Encounters:  08/30/15 175 lb 12.8 oz (79.742 kg)  08/21/15 170 lb 4.8 oz (77.248 kg)  07/24/15 169 lb 1.6 oz (76.703 kg)    Physical Exam  Constitutional: She is oriented to person, place, and time. She appears well-developed and well-nourished. No distress.  HENT:  Head: Normocephalic and atraumatic.  Mouth/Throat: Oropharynx is clear and moist. No oropharyngeal exudate.  Eyes: Conjunctivae and lids are normal. Right eye exhibits no discharge. Left eye exhibits no discharge. No scleral icterus.  Cardiovascular: Normal rate and regular rhythm.   Pulmonary/Chest: Effort normal and breath sounds normal. No respiratory distress.  Abdominal: Soft. Normal appearance and bowel sounds are normal. She exhibits no distension. There is no splenomegaly or hepatomegaly. There is no tenderness.  Musculoskeletal: Normal range of motion.  Neurological: She is alert and oriented to person, place, and time.  Skin:  Skin is warm, dry and intact. No rash noted. No pallor.  Psychiatric: She has a normal mood and affect. Her behavior is normal. Judgment and thought content normal.  Nursing note and vitals reviewed.   Results for orders placed or performed during the hospital encounter of [id number]  Basic metabolic panel  Result Value Ref Range   Sodium 137 135 - 145 mmol/L   Potassium 4.1 3.5 - 5.1 mmol/L    Chloride 108 101 - 111 mmol/L   CO2 23 22 - 32 mmol/L   Glucose, Bld 120 (H) 65 - 99 mg/dL   BUN 12 6 - 20 mg/dL   Creatinine, Ser 0.81 0.44 - 1.00 mg/dL   Calcium 8.6 (L) 8.9 - 10.3 mg/dL   GFR calc non Af Amer >60 >60 mL/min   GFR calc Af Amer >60 >60 mL/min   Anion gap 6 5 - 15  CBC  Result Value Ref Range   WBC 8.0 3.6 - 11.0 K/uL   RBC 3.43 (L) 3.80 - 5.20 MIL/uL   Hemoglobin 7.6 (L) 12.0 - 16.0 g/dL   HCT 24.1 (L) 35.0 - 47.0 %   MCV 70.4 (L) 80.0 - 100.0 fL   MCH 22.2 (L) 26.0 - 34.0 pg   MCHC 31.5 (L) 32.0 - 36.0 g/dL   RDW 17.7 (H) 11.5 - 14.5 %   Platelets 475 (H) 150 - 440 K/uL  Troponin I  Result Value Ref Range   Troponin I <0.03 <0.031 ng/mL  TSH  Result Value Ref Range   TSH 1.448 0.350 - 4.500 uIU/mL  Troponin I  Result Value Ref Range   Troponin I <0.03 <0.031 ng/mL  Troponin I  Result Value Ref Range   Troponin I <0.03 <0.031 ng/mL  Iron and TIBC  Result Value Ref Range   Iron 25 (L) 28 - 170 ug/dL   TIBC 405 250 - 450 ug/dL   Saturation Ratios 6 (L) 10.4 - 31.8 %   UIBC 380 ug/dL  Ferritin  Result Value Ref Range   Ferritin 5 (L) 11 - 307 ng/mL  CBC  Result Value Ref Range   WBC 9.3 3.6 - 11.0 K/uL   RBC 3.55 (L) 3.80 - 5.20 MIL/uL   Hemoglobin 8.4 (L) 12.0 - 16.0 g/dL   HCT 25.8 (L) 35.0 - 47.0 %   MCV 72.7 (L) 80.0 - 100.0 fL   MCH 23.5 (L) 26.0 - 34.0 pg   MCHC 32.4 32.0 - 36.0 g/dL   RDW 19.5 (H) 11.5 - 14.5 %   Platelets 411 150 - 440 K/uL  Glucose, capillary  Result Value Ref Range   Glucose-Capillary 138 (H) 65 - 99 mg/dL  Glucose, capillary  Result Value Ref Range   Glucose-Capillary 184 (H) 65 - 99 mg/dL  Glucose, capillary  Result Value Ref Range   Glucose-Capillary 80 65 - 99 mg/dL  hCG, quantitative, pregnancy  Result Value Ref Range   hCG, Beta Chain, Quant, S <1 <5 mIU/mL  Glucose, capillary  Result Value Ref Range   Glucose-Capillary 144 (H) 65 - 99 mg/dL  Type and screen for Red Blood Exchange  Result Value Ref  Range   ABO/RH(D) A POS    Antibody Screen NEG    Sample Expiration 08/24/2015    Unit Number [id number]    Blood Component Type RED CELLS,LR    Unit division 00    Status of Unit ISSUED,FINAL    Transfusion Status OK TO TRANSFUSE    Crossmatch Result  Compatible   Prepare RBC  Result Value Ref Range   Order Confirmation ORDER PROCESSED BY BLOOD BANK   Prepare RBC  Result Value Ref Range   Order Confirmation DUPLICATE ORDER REQUEST   ABO/Rh  Result Value Ref Range   ABO/RH(D) A POS       Assessment & Plan:   Problem List Items Addressed This Visit      Unprioritized   DM2 (diabetes mellitus, type 2)    Hgb A1C is 6.8.  Stable, continue present medications.   Refer for diabetes education      Relevant Medications   losartan (COZAAR) 50 MG tablet   Other Relevant Orders   Bayer DCA Hb A1c Waived   Microalbumin, Urine Waived   Uric acid   Lipid Panel Piccolo, Waived   Comprehensive metabolic panel   Ambulatory referral to diabetic education   CAD in native artery    Cholesterol stable.  Per Dr. Fletcher Anon      Relevant Medications   losartan (COZAAR) 50 MG tablet   Iron deficiency anemia    H/H 8.7/27.7 from 8.4/25.8.  Complete workup with Dr. Elmer Bales. Continue iron supplements      Relevant Orders   CBC With Differential/Platelet   Heavy menses   Relevant Orders   CBC With Differential/Platelet   Coronary artery disease involving native coronary artery of native heart with angina pectoris with documented spasm   Relevant Medications   losartan (COZAAR) 50 MG tablet   Other Relevant Orders   Lipid Panel Piccolo, Waived   Proteinuria    Restart Losartan at 50 mgs.  Await Creatnine       Other Visit Diagnoses    Immunization due    -  Primary    Relevant Orders    Flu Vaccine QUAD 36+ mos PF IM (Fluarix & Fluzone Quad PF) (Completed)        Follow up plan: Return in about 3 months (around 11/29/2015).

## 2015-08-30 NOTE — Assessment & Plan Note (Signed)
Restart Losartan at 50 mgs.  Await Creatnine

## 2015-08-30 NOTE — Assessment & Plan Note (Signed)
H/H 8.7/27.7 from 8.4/25.8.  Complete workup with Dr. Elmer Bales. Continue iron supplements

## 2015-08-31 ENCOUNTER — Encounter: Payer: Self-pay | Admitting: Unknown Physician Specialty

## 2015-08-31 LAB — COMPREHENSIVE METABOLIC PANEL
ALT: 33 IU/L — ABNORMAL HIGH (ref 0–32)
AST: 24 IU/L (ref 0–40)
Albumin/Globulin Ratio: 1.3 (ref 1.1–2.5)
Albumin: 3.6 g/dL (ref 3.5–5.5)
Alkaline Phosphatase: 104 IU/L (ref 39–117)
BUN/Creatinine Ratio: 18 (ref 8–20)
BUN: 14 mg/dL (ref 6–20)
Bilirubin Total: <0.2 mg/dL (ref 0.0–1.2)
CO2: 20 mmol/L (ref 18–29)
Calcium: 8.7 mg/dL (ref 8.7–10.2)
Chloride: 103 mmol/L (ref 97–108)
Creatinine, Ser: 0.79 mg/dL (ref 0.57–1.00)
GFR calc Af Amer: 112 mL/min/{1.73_m2} (ref >59)
GFR calc non Af Amer: 97 mL/min/{1.73_m2} (ref >59)
Globulin, Total: 2.7 g/dL (ref 1.5–4.5)
Glucose: 165 mg/dL — ABNORMAL HIGH (ref 65–99)
Potassium: 4.8 mmol/L (ref 3.5–5.2)
Sodium: 135 mmol/L (ref 134–144)
Total Protein: 6.3 g/dL (ref 6.0–8.5)

## 2015-08-31 LAB — URIC ACID: Uric Acid: 5.8 mg/dL (ref 2.5–7.1)

## 2015-09-05 ENCOUNTER — Ambulatory Visit: Payer: Medicaid Other | Admitting: Dietician

## 2015-09-07 ENCOUNTER — Ambulatory Visit: Payer: Medicaid Other | Admitting: Cardiovascular Disease

## 2015-09-07 ENCOUNTER — Telehealth: Payer: Self-pay | Admitting: Unknown Physician Specialty

## 2015-09-07 NOTE — Telephone Encounter (Signed)
Pt called requests call back about side effects of medication she is taking. Please call ASAP. Thanks.

## 2015-09-08 MED ORDER — VALSARTAN 80 MG PO TABS: 80.0000 mg | ORAL_TABLET | Freq: Every day | ORAL | Status: DC

## 2015-09-08 NOTE — Telephone Encounter (Signed)
Called patient and she stated that the Losartan is causing her to have diarrhea (for 6 days now), pain in lower back, and a sour stomach. Also stated she tried taking anti-diarrhea's but they did not help. Wants to know if we can give her something else or what she should do.

## 2015-09-08 NOTE — Telephone Encounter (Signed)
Rx for Diovan sent

## 2015-09-08 NOTE — Telephone Encounter (Signed)
Called and left patient a voicemail letting her know that a new rx was sent to her pharmacy.

## 2015-09-09 ENCOUNTER — Telehealth: Payer: Self-pay | Admitting: Cardiology

## 2015-09-09 ENCOUNTER — Other Ambulatory Visit: Payer: Self-pay | Admitting: Cardiology

## 2015-09-09 DIAGNOSIS — Z95820 Peripheral vascular angioplasty status with implants and grafts: Secondary | ICD-10-CM

## 2015-09-09 DIAGNOSIS — I2510 Atherosclerotic heart disease of native coronary artery without angina pectoris: Secondary | ICD-10-CM

## 2015-09-09 MED ORDER — TICAGRELOR 90 MG PO TABS: 90.0000 mg | ORAL_TABLET | Freq: Two times a day (BID) | ORAL | Status: DC

## 2015-09-09 NOTE — Telephone Encounter (Signed)
Refilled her Brilinta. With 6 refills--

## 2015-09-11 ENCOUNTER — Encounter: Payer: Medicaid Other | Attending: Cardiovascular Disease

## 2015-09-11 DIAGNOSIS — I252 Old myocardial infarction: Secondary | ICD-10-CM | POA: Insufficient documentation

## 2015-09-12 ENCOUNTER — Encounter: Payer: Self-pay | Admitting: *Deleted

## 2015-09-13 ENCOUNTER — Telehealth: Payer: Self-pay | Admitting: *Deleted

## 2015-09-13 ENCOUNTER — Encounter: Payer: Self-pay | Admitting: *Deleted

## 2015-09-13 DIAGNOSIS — I252 Old myocardial infarction: Secondary | ICD-10-CM

## 2015-09-13 NOTE — Progress Notes (Signed)
Cardiac Individual Treatment Plan  Patient Details  Name: [name] [name] MRN: [id number] Date of Birth: [birth year] Referring Provider:  No ref. provider found  Initial Encounter Date:    Visit Diagnosis: Status post non-ST elevation myocardial infarction (NSTEMI)  Patient's Home Medications on Admission:  Current outpatient prescriptions:  .  aspirin EC 81 MG tablet, Take 81 mg by mouth daily., Disp: , Rfl:  .  atorvastatin (LIPITOR) 40 MG tablet, Take 1 tablet (40 mg total) by mouth daily at 6 PM., Disp: 30 tablet, Rfl: 0 .  ferrous sulfate 325 (65 FE) MG tablet, Take 1 tablet (325 mg total) by mouth 3 (three) times daily with meals., Disp: 90 tablet, Rfl: 3 .  glimepiride (AMARYL) 4 MG tablet, Take 4 mg by mouth daily. , Disp: , Rfl:  .  losartan (COZAAR) 50 MG tablet, Take 1 tablet (50 mg total) by mouth daily., Disp: 30 tablet, Rfl: 3 .  mirtazapine (REMERON) 15 MG tablet, Take 15 mg by mouth at bedtime., Disp: , Rfl:  .  nitroGLYCERIN (NITROSTAT) 0.4 MG SL tablet, Place 0.4 mg under the tongue every 5 (five) minutes as needed for chest pain., Disp: , Rfl:  .  norethindrone-ethinyl estradiol 1/35 (ORTHO-NOVUM, NORTREL,CYCLAFEM) tablet, Patient will take 2 tablet daily x 5 days then daily, Disp: , Rfl:  .  pantoprazole (PROTONIX) 40 MG tablet, Take 1 tablet (40 mg total) by mouth daily at 6 (six) AM., Disp: 30 tablet, Rfl: 0 .  ticagrelor (BRILINTA) 90 MG TABS tablet, Take 1 tablet (90 mg total) by mouth 2 (two) times daily., Disp: 60 tablet, Rfl: 6 .  valsartan (DIOVAN) 80 MG tablet, Take 1 tablet (80 mg total) by mouth daily., Disp: 30 tablet, Rfl: 1  Past Medical History: Past Medical History  Diagnosis Date  . CAD (coronary artery disease)     a. NSTEMI 03/2015: mLCx 100% s/p PCI/DES, RI 90% s/p PCI/DES, EF 35%; b. 04/2015 Relook Cath: LM nl, LAD 40p, RI patent stent, LCX patent stent, RCA nl, EF 55-65%.  . Chronic systolic CHF (congestive heart failure)     a. echo 03/2015: EF  30-35%, mild concentric LVH, severe HK of inf, inflat, & lat walls, mod MR, mild TR;  b. 04/2015 LV Gram: EF 55-65%.  . Ischemic cardiomyopathy     a. 03/2015 EF 30-35% post NSTEMI;  b. 04/2015 EF 55-65% on LV gram.  . DM2 (diabetes mellitus, type 2)     a. since 2002  . Tobacco abuse     a. quit 03/2015.  . Obesity   . Hyperlipidemia   . Hypotension     a. resulting in discontinuation of losartan and reduction in coreg dose.  Marland Kitchen Heavy menses     a. H/O IUD - expired in 2014 - remains in place.  . Iron deficiency anemia     Tobacco Use: History  Smoking status  . Former Smoker -- 15 years  . Quit date: 03/10/2015  Smokeless tobacco  . Never Used    Labs: Recent Review Flowsheet Data    Labs for ITP Cardiac and Pulmonary Rehab Latest Ref Rng 03/31/2015 05/05/2015 05/16/2015   Cholestrol 100 - 199 mg/dL 133 - 93(L)   LDLCALC 0 - 99 mg/dL 92 - 46   HDL >39 mg/dL 26(L) - 32(L)   Trlycerides 0 - 149 mg/dL 77 - 73   Hemoglobin A1c 4.8 - 5.6 % 14.5(H) 14.2(H) -         POCT Glucose  07/24/15 1406           POCT Blood Glucose   Pre-Exercise 158 mg/dL          Exercise Target Goals:    Exercise Program Goal: Individual exercise prescription set with THRR, safety & activity barriers. Participant demonstrates ability to understand and report RPE using BORG scale, to self-measure pulse accurately, and to acknowledge the importance of the exercise prescription.  Exercise Prescription Goal: Starting with aerobic activity 30 plus minutes a day, 3 days per week for initial exercise prescription. Provide home exercise prescription and guidelines that participant acknowledges understanding prior to discharge.  Activity Barriers & Risk Stratification:     Activity Barriers & Risk Stratification - 07/24/15 1933    Activity Barriers & Risk Stratification   Activity Barriers None   Risk Stratification High      6 Minute Walk:     6 Minute Walk      07/24/15 1546       6  Minute Walk   Phase Initial     Distance 326 feet  NS test: 326 steps     Walk Time 6 minutes     Resting HR 116 bpm     Resting BP 100/70 mmHg     Max Ex. HR 111 bpm     Max Ex. BP 110/60 mmHg     RPE 7     Symptoms No        Initial Exercise Prescription:     Initial Exercise Prescription - 07/24/15 1500    Date of Initial Exercise Prescription   Date 07/24/15   Bike   Level 1   Minutes 15   Recumbant Bike   Level 3   RPM 40   Watts 30   Minutes 15   NuStep   Level 3   Watts 40   Minutes 15   Arm Ergometer   Level 1   Watts 10   Minutes 15   Arm/Foot Ergometer   Level 2   Watts 15   Minutes 15   Cybex   Level 2   RPM 40   Minutes 15   Recumbant Elliptical   Level 1   RPM 30   Watts 20   Minutes 15   REL-XR   Level 3   Watts 30   Minutes 15   Prescription Details   Frequency (times per week) 3   Duration Progress to 30 minutes of continuous aerobic without signs/symptoms of physical distress   Intensity   THRR REST +  20   Ratings of Perceived Exertion 11-15   Progression Continue progressive overload as per policy without signs/symptoms or physical distress.   Resistance Training   Training Prescription Yes   Weight 2   Reps 10-12      Exercise Prescription Changes:     Exercise Prescription Changes      08/02/15 1700 08/21/15 0600 09/12/15 1500       Exercise Review   Progression Yes Yes No  Absent since last review; last visit 08/02/15     Response to Exercise   Blood Pressure (Admit)  112/62 mmHg      Blood Pressure (Exercise)  124/60 mmHg      Blood Pressure (Exit)  104/68 mmHg      Heart Rate (Admit)  69 bpm      Heart Rate (Exercise)  121 bpm      Heart Rate (Exit)  108 bpm  Rating of Perceived Exertion (Exercise)  13      Symptoms Some nausea        Duration Progress to 30 minutes of continuous aerobic without signs/symptoms of physical distress Progress to 30 minutes of continuous aerobic without signs/symptoms of  physical distress Progress to 30 minutes of continuous aerobic without signs/symptoms of physical distress     Intensity Rest + 30 Rest + 30 Rest + 30     Progression Continue progressive overload as per policy without signs/symptoms or physical distress. Continue progressive overload as per policy without signs/symptoms or physical distress. Continue progressive overload as per policy without signs/symptoms or physical distress.     Resistance Training   Training Prescription Yes Yes Yes     Weight _0 Reps 10-15 10-15 10-15     Interval Training   Interval Training No No No     NuStep   Level  3 3     Watts  40 40     Minutes  15 15     Recumbant Elliptical   Level _1 RPM 40 40 40     Minutes _2 REL-XR   Level  3 3     Watts  55 55     Minutes  15 15        Discharge Exercise Prescription (Final Exercise Prescription Changes):     Exercise Prescription Changes - 09/12/15 1500    Exercise Review   Progression No  Absent since last review; last visit 08/02/15   Response to Exercise   Duration Progress to 30 minutes of continuous aerobic without signs/symptoms of physical distress   Intensity Rest + 30   Progression Continue progressive overload as per policy without signs/symptoms or physical distress.   Resistance Training   Training Prescription Yes   Weight 2   Reps 10-15   Interval Training   Interval Training No   NuStep   Level 3   Watts 40   Minutes 15   Recumbant Elliptical   Level 3   RPM 40   Minutes 15   REL-XR   Level 3   Watts 55   Minutes 15      Nutrition:  Target Goals: Understanding of nutrition guidelines, daily intake of sodium <1566m, cholesterol <2028m calories 30% from fat and 7% or less from saturated fats, daily to have 5 or more servings of fruits and vegetables.  Biometrics:     Pre Biometrics - 07/24/15 1554    Pre Biometrics   Height 5' 5.8" (1.671 m)   Weight 169 lb 1.6 oz (76.703 kg)   Waist  Circumference 34.5 inches   Hip Circumference 40 inches   Waist to Hip Ratio 0.86 %   BMI (Calculated) 27.5       Nutrition Therapy Plan and Nutrition Goals:     Nutrition Therapy & Goals - 07/24/15 1939    Nutrition Therapy   Drug/Food Interactions Statins/Certain Fruits   Intervention Plan   Intervention Using nutrition plan and personal goals to gain a healthy nutrition lifestyle. Add exercise as prescribed.      Nutrition Discharge: Rate Your Plate Scores:   Nutrition Goals Re-Evaluation:     Nutrition Goals Re-Evaluation      09/13/15 1420           Personal Goal #1 Re-Evaluation   Goal Progress Seen No  Comments I left vm to see how she was doing and if she was going to come back to Cardiac Rehab. I asked her to let us know either way since we haven't seen her in a while.           Psychosocial: Target Goals: Acknowledge presence or absence of depression, maximize coping skills, provide positive support system. Participant is able to verbalize types and ability to use techniques and skills needed for reducing stress and depression.  Initial Review & Psychosocial Screening:     Initial Psych Review & Screening - 07/24/15 1941    Initial Review   Current issues with Current Anxiety/Panic;Current Stress Concerns   Family Dynamics   Good Support System? Yes   Barriers   Psychosocial barriers to participate in program There are no identifiable barriers or psychosocial needs.;The patient should benefit from training in stress management and relaxation.   Screening Interventions   Interventions Encouraged to exercise;Program counselor consult      Quality of Life Scores:     Quality of Life - 07/24/15 2014    Quality of Life Scores   Health/Function Pre 12.9 %   Socioeconomic Pre 13.36 %   Psych/Spiritual Pre 12.64 %   Family Pre 15.5 %   GLOBAL Pre 13.32 %      PHQ-9:     Recent Review Flowsheet Data    Depression screen [facility] 2/9 07/24/2015    Decreased Interest 2   Down, Depressed, Hopeless 2   PHQ - 2 Score 4   Altered sleeping 3   Tired, decreased energy 1   Change in appetite 1   Feeling bad or failure about yourself  2   Trouble concentrating 0   Moving slowly or fidgety/restless 1   Suicidal thoughts 1   PHQ-9 Score 13   Difficult doing work/chores Somewhat difficult      Psychosocial Evaluation and Intervention:     Psychosocial Evaluation - 07/26/15 1706    Psychosocial Evaluation & Interventions   Interventions Stress management education;Relaxation education;Encouraged to exercise with the program and follow exercise prescription;Physician referral   Comments Counselor met with Ms. Maurin today for initial psychosocial evaluation.  She is a 33 year old who reports having a heart attack this past April and is also a type 2 Diabetic.  Ms. B has a strong support system living with her sister, a very helpful 71 year old daughter and a fiance who lives close by.  Ms. B states she is struggling with her medications currently that are causing a great deal of GI issues as well as sweating, dizziness, low blood sugars, blurred vision and she is also having difficulty sleeping at night - stating possibly 5 hours/night on a good night - falling asleep around 7AM and sleeping until mid-day.  Ms. B also reports a history of anxiety and panic symptoms for the past 6 years -never fully diagnosed or treated.  She admits to some irritability symptoms as well with some depressive symptoms currently.  Therapist provided Ms. B with information to see a psychiatrist for a medication evaluation for these mental health symptoms.  She is encouraged to continue speaking with her current Drs about the medication side effects that are causing the other negative symptoms.  Ms. B has goals to get healthier and increase her stamina and strength while in this program.  Counselor will follow up with her concerning these recommendations.   Continued  Psychosocial Services Needed Yes  Ms. B is recommended  to see a psychiatrist for a medication evaluation due to a history of untreated panic atttacks and some current symptoms.  She will also benefit from the psychoeducational components of this program as well as consistent exercise.      Psychosocial Re-Evaluation:     Psychosocial Re-Evaluation      07/31/15 1706 09/13/15 1420         Psychosocial Re-Evaluation   Interventions  Encouraged to attend Cardiac Rehabilitation for the exercise      Comments Follow up with Ms. B today reporting she had an initial assessment with the psychiatric group but will not see them again until after she sees her Dr. for possible medications for depressive and anxiety symptoms.  Counselor assessed sleep with Ms. B continuing to report difficulty getting to sleep is ongoing.  Counselor suggested talking with Pharmacist or doctor about an OTC natural sleep aid to see if this might help in the meantime until the psychiatrist is able to collaborate with her PCP.  Counselor will continue to follow with Ms. B. while in this program.   I left vm to see how she was doing and if she was going to come back to Cardiac Rehab. I asked her to let us know either way since we haven't seen her in a while.          Vocational Rehabilitation: Provide vocational rehab assistance to qualifying candidates.   Vocational Rehab Evaluation & Intervention:     Vocational Rehab - 07/24/15 1934    Initial Vocational Rehab Evaluation & Intervention   Assessment shows need for Vocational Rehabilitation No      Education: Education Goals: Education classes will be provided on a weekly basis, covering required topics. Participant will state understanding/return demonstration of topics presented.  Learning Barriers/Preferences:     Learning Barriers/Preferences - 07/24/15 1933    Learning Barriers/Preferences   Learning Barriers Sight   Learning Preferences Written Material       Education Topics: General Nutrition Guidelines/Fats and Fiber: -Group instruction provided by verbal, written material, models and posters to present the general guidelines for heart healthy nutrition. Gives an explanation and review of dietary fats and fiber.   Controlling Sodium/Reading Food Labels: -Group verbal and written material supporting the discussion of sodium use in heart healthy nutrition. Review and explanation with models, verbal and written materials for utilization of the food label.          Cardiac Rehab from 08/02/2015 in [facility] Cardiac Rehab   Date  07/31/15   Educator  PI   Instruction Review Code  2- meets goals/outcomes      Exercise Physiology & Risk Factors: - Group verbal and written instruction with models to review the exercise physiology of the cardiovascular system and associated critical values. Details cardiovascular disease risk factors and the goals associated with each risk factor.   Aerobic Exercise & Resistance Training: - Gives group verbal and written discussion on the health impact of inactivity. On the components of aerobic and resistive training programs and the benefits of this training and how to safely progress through these programs.   Flexibility, Balance, General Exercise Guidelines: - Provides group verbal and written instruction on the benefits of flexibility and balance training programs. Provides general exercise guidelines with specific guidelines to those with heart or lung disease. Demonstration and skill practice provided.   Stress Management: - Provides group verbal and written instruction about the health risks of elevated stress, cause of high stress, and healthy ways  to reduce stress.   Depression: - Provides group verbal and written instruction on the correlation between heart/lung disease and depressed mood, treatment options, and the stigmas associated with seeking treatment.      Cardiac Rehab from 08/02/2015 in  [facility] Cardiac Rehab   Date  08/02/15   Educator  [facility]   Instruction Review Code  2- meets goals/outcomes      Anatomy & Physiology of the Heart: - Group verbal and written instruction and models provide basic cardiac anatomy and physiology, with the coronary electrical and arterial systems. Review of: AMI, Angina, Valve disease, Heart Failure, Cardiac Arrhythmia, Pacemakers, and the ICD.   Cardiac Procedures: - Group verbal and written instruction and models to describe the testing methods done to diagnose heart disease. Reviews the outcomes of the test results. Describes the treatment choices: Medical Management, Angioplasty, or Coronary Bypass Surgery.   Cardiac Medications: - Group verbal and written instruction to review commonly prescribed medications for heart disease. Reviews the medication, class of the drug, and side effects. Includes the steps to properly store meds and maintain the prescription regimen.   Go Sex-Intimacy & Heart Disease, Get SMART - Goal Setting: - Group verbal and written instruction through game format to discuss heart disease and the return to sexual intimacy. Provides group verbal and written material to discuss and apply goal setting through the application of the S.M.A.R.T. Method.   Other Matters of the Heart: - Provides group verbal, written materials and models to describe Heart Failure, Angina, Valve Disease, and Diabetes in the realm of heart disease. Includes description of the disease process and treatment options available to the cardiac patient.   Exercise & Equipment Safety: - Individual verbal instruction and demonstration of equipment use and safety with use of the equipment.      Cardiac Rehab from 08/02/2015 in [facility] Cardiac Rehab   Date  07/24/15   Educator  C. Jayzon Taras,RN   Instruction Review Code  1- partially meets, needs review/practice      Infection Prevention: - Provides verbal and written material to individual with discussion  of infection control including proper hand washing and proper equipment cleaning during exercise session.      Cardiac Rehab from 08/02/2015 in [facility] Cardiac Rehab   Date  07/24/15   Educator  Jimmie Molly   Instruction Review Code  2- meets goals/outcomes      Falls Prevention: - Provides verbal and written material to individual with discussion of falls prevention and safety.      Cardiac Rehab from 08/02/2015 in [facility] Cardiac Rehab   Date  07/24/15   Educator  C. Kessa Fairbairn,RN   Instruction Review Code  2- meets goals/outcomes      Diabetes: - Individual verbal and written instruction to review signs/symptoms of diabetes, desired ranges of glucose level fasting, after meals and with exercise. Advice that pre and post exercise glucose checks will be done for 3 sessions at entry of program.      Cardiac Rehab from 08/02/2015 in [facility] Cardiac Rehab   Date  07/24/15   Educator  C. Meaghen Vecchiarelli,RN   Instruction Review Code  1- partially meets, needs review/practice       Knowledge Questionnaire Score:     Knowledge Questionnaire Score - 07/24/15 1933    Knowledge Questionnaire Score   Pre Score 24      Personal Goals and Risk Factors at Admission:     Personal Goals and Risk Factors at Admission - 07/24/15 1939  Personal Goals and Risk Factors on Admission    Weight Management Yes   Intervention Learn and follow the exercise and diet guidelines while in the program. Utilize the nutrition and education classes to help gain knowledge of the diet and exercise expectations in the program   Increase Aerobic Exercise and Physical Activity Yes   Intervention While in program, learn and follow the exercise prescription taught. Start at a low level workload and increase workload after able to maintain previous level for 30 minutes. Increase time before increasing intensity.   Take Less Medication Yes   Intervention Learn your risk factors and begin the lifestyle modifications for risk factor  control during your time in the program.   Understand more about Heart/Pulmonary Disease. Yes   Intervention While in program utilize professionals for any questions, and attend the education sessions. Great websites to use are www.americanheart.org or www.lung.org for reliable information.   Diabetes Yes   Goal Blood glucose control identified by blood glucose values, HgbA1C. Participant verbalizes understanding of the signs/symptoms of hyper/hypo glycemia, proper foot care and importance of medication and nutrition plan for blood glucose control.   Intervention Provide nutrition & aerobic exercise along with prescribed medications to achieve blood glucose in normal ranges: Fasting 65-99 mg/dL   Hypertension Yes   Goal Participant will see blood pressure controlled within the values of 140/26mm/Hg or within value directed by their physician.   Intervention Provide nutrition & aerobic exercise along with prescribed medications to achieve BP 140/90 or less.   Lipids Yes   Goal Cholesterol controlled with medications as prescribed, with individualized exercise RX and with personalized nutrition plan. Value goals: LDL < $Rem'70mg'OnmU$ , HDL > $Rem'40mg'TEos$ . Participant states understanding of desired cholesterol values and following prescriptions.   Intervention Provide nutrition & aerobic exercise along with prescribed medications to achieve LDL '70mg'$ , HDL >$Remo'40mg'KibFd$ .   Stress Yes   Goal To meet with psychosocial counselor for stress and relaxation information and guidance. To state understanding of performing relaxation techniques and or identifying personal stressors.   Intervention Provide education on types of stress, identifiying stressors, and ways to cope with stress. Provide demonstration and active practice of relaxation techniques.      Personal Goals and Risk Factors Review:      Goals and Risk Factor Review      09/13/15 1419           Increase Aerobic Exercise and Physical Activity   Goals  Progress/Improvement seen  No       Comments I left vm to see how she was doing and if she was going to come back to Cardiac Rehab. I asked her to let us know either way since we haven't seen her in a while.           Personal Goals Discharge (Final Personal Goals and Risk Factors Review):      Goals and Risk Factor Review - 09/13/15 1419    Increase Aerobic Exercise and Physical Activity   Goals Progress/Improvement seen  No   Comments I left vm to see how she was doing and if she was going to come back to Cardiac Rehab. I asked her to let us know either way since we haven't seen her in a while.        Comments: I left vm to see how she was doing and if she was going to come back to Cardiac Rehab. I asked her to let us know either way since we haven't seen  her in a while.

## 2015-09-13 NOTE — Telephone Encounter (Signed)
I left vm to see how she was doing and if she was going to come back to Cardiac Rehab. I asked her to let us know either way since we haven't seen her in a while.

## 2015-09-20 NOTE — Progress Notes (Signed)
Cardiac Individual Treatment Plan  Patient Details  Name: [name] [name] MRN: [id number] Date of Birth: [birth year] Referring Provider:  No ref. provider found  Initial Encounter Date:    Visit Diagnosis: Status post non-ST elevation myocardial infarction (NSTEMI)  Patient's Home Medications on Admission:  Current outpatient prescriptions:  .  aspirin EC 81 MG tablet, Take 81 mg by mouth daily., Disp: , Rfl:  .  atorvastatin (LIPITOR) 40 MG tablet, Take 1 tablet (40 mg total) by mouth daily at 6 PM., Disp: 30 tablet, Rfl: 0 .  ferrous sulfate 325 (65 FE) MG tablet, Take 1 tablet (325 mg total) by mouth 3 (three) times daily with meals., Disp: 90 tablet, Rfl: 3 .  glimepiride (AMARYL) 4 MG tablet, Take 4 mg by mouth daily. , Disp: , Rfl:  .  losartan (COZAAR) 50 MG tablet, Take 1 tablet (50 mg total) by mouth daily., Disp: 30 tablet, Rfl: 3 .  mirtazapine (REMERON) 15 MG tablet, Take 15 mg by mouth at bedtime., Disp: , Rfl:  .  nitroGLYCERIN (NITROSTAT) 0.4 MG SL tablet, Place 0.4 mg under the tongue every 5 (five) minutes as needed for chest pain., Disp: , Rfl:  .  norethindrone-ethinyl estradiol 1/35 (ORTHO-NOVUM, NORTREL,CYCLAFEM) tablet, Patient will take 2 tablet daily x 5 days then daily, Disp: , Rfl:  .  pantoprazole (PROTONIX) 40 MG tablet, Take 1 tablet (40 mg total) by mouth daily at 6 (six) AM., Disp: 30 tablet, Rfl: 0 .  ticagrelor (BRILINTA) 90 MG TABS tablet, Take 1 tablet (90 mg total) by mouth 2 (two) times daily., Disp: 60 tablet, Rfl: 6 .  valsartan (DIOVAN) 80 MG tablet, Take 1 tablet (80 mg total) by mouth daily., Disp: 30 tablet, Rfl: 1  Past Medical History: Past Medical History  Diagnosis Date  . CAD (coronary artery disease)     a. NSTEMI 03/2015: mLCx 100% s/p PCI/DES, RI 90% s/p PCI/DES, EF 35%; [name]. 04/2015 Relook Cath: LM nl, LAD 40p, RI patent stent, LCX patent stent, RCA nl, EF 55-65%.  . Chronic systolic CHF (congestive heart failure)     a. echo 03/2015: EF  30-35%, mild concentric LVH, severe HK of inf, inflat, & lat walls, mod MR, mild TR;  [name]. 04/2015 LV Gram: EF 55-65%.  . Ischemic cardiomyopathy     a. 03/2015 EF 30-35% post NSTEMI;  [name]. 04/2015 EF 55-65% on LV gram.  . DM2 (diabetes mellitus, type 2)     a. since 2002  . Tobacco abuse     a. quit 03/2015.  . Obesity   . Hyperlipidemia   . Hypotension     a. resulting in discontinuation of losartan and reduction in coreg dose.  Marland Kitchen Heavy menses     a. H/O IUD - expired in 2014 - remains in place.  . Iron deficiency anemia     Tobacco Use: History  Smoking status  . Former Smoker -- 15 years  . Quit date: 03/10/2015  Smokeless tobacco  . Never Used    Labs: Recent Review Flowsheet Data    Labs for ITP Cardiac and Pulmonary Rehab Latest Ref Rng 03/31/2015 05/05/2015 05/16/2015   Cholestrol 100 - 199 mg/dL 133 - 93(L)   LDLCALC 0 - 99 mg/dL 92 - 46   HDL >39 mg/dL 26(L) - 32(L)   Trlycerides 0 - 149 mg/dL 77 - 73   Hemoglobin A1c 4.8 - 5.6 % 14.5(H) 14.2(H) -         POCT Glucose  07/24/15 1406           POCT Blood Glucose   Pre-Exercise 158 mg/dL          Exercise Target Goals:    Exercise Program Goal: Individual exercise prescription set with THRR, safety & activity barriers. Participant demonstrates ability to understand and report RPE using BORG scale, to self-measure pulse accurately, and to acknowledge the importance of the exercise prescription.  Exercise Prescription Goal: Starting with aerobic activity 30 plus minutes a day, 3 days per week for initial exercise prescription. Provide home exercise prescription and guidelines that participant acknowledges understanding prior to discharge.  Activity Barriers & Risk Stratification:     Activity Barriers & Risk Stratification - 07/24/15 1933    Activity Barriers & Risk Stratification   Activity Barriers None   Risk Stratification High      6 Minute Walk:     6 Minute Walk      07/24/15 1546       6  Minute Walk   Phase Initial     Distance 326 feet  NS test: 326 steps     Walk Time 6 minutes     Resting HR 116 bpm     Resting BP 100/70 mmHg     Max Ex. HR 111 bpm     Max Ex. BP 110/60 mmHg     RPE 7     Symptoms No        Initial Exercise Prescription:     Initial Exercise Prescription - 07/24/15 1500    Date of Initial Exercise Prescription   Date 07/24/15   Bike   Level 1   Minutes 15   Recumbant Bike   Level 3   RPM 40   Watts 30   Minutes 15   NuStep   Level 3   Watts 40   Minutes 15   Arm Ergometer   Level 1   Watts 10   Minutes 15   Arm/Foot Ergometer   Level 2   Watts 15   Minutes 15   Cybex   Level 2   RPM 40   Minutes 15   Recumbant Elliptical   Level 1   RPM 30   Watts 20   Minutes 15   REL-XR   Level 3   Watts 30   Minutes 15   Prescription Details   Frequency (times per week) 3   Duration Progress to 30 minutes of continuous aerobic without signs/symptoms of physical distress   Intensity   THRR REST +  20   Ratings of Perceived Exertion 11-15   Progression Continue progressive overload as per policy without signs/symptoms or physical distress.   Resistance Training   Training Prescription Yes   Weight 2   Reps 10-12      Exercise Prescription Changes:     Exercise Prescription Changes      08/02/15 1700 08/21/15 0600 09/12/15 1500       Exercise Review   Progression Yes Yes No  Absent since last review; last visit 08/02/15     Response to Exercise   Blood Pressure (Admit)  112/62 mmHg      Blood Pressure (Exercise)  124/60 mmHg      Blood Pressure (Exit)  104/68 mmHg      Heart Rate (Admit)  69 bpm      Heart Rate (Exercise)  121 bpm      Heart Rate (Exit)  108 bpm  Rating of Perceived Exertion (Exercise)  13      Symptoms Some nausea        Duration Progress to 30 minutes of continuous aerobic without signs/symptoms of physical distress Progress to 30 minutes of continuous aerobic without signs/symptoms of  physical distress Progress to 30 minutes of continuous aerobic without signs/symptoms of physical distress     Intensity Rest + 30 Rest + 30 Rest + 30     Progression Continue progressive overload as per policy without signs/symptoms or physical distress. Continue progressive overload as per policy without signs/symptoms or physical distress. Continue progressive overload as per policy without signs/symptoms or physical distress.     Resistance Training   Training Prescription Yes Yes Yes     Weight _0 Reps 10-15 10-15 10-15     Interval Training   Interval Training No No No     NuStep   Level  3 3     Watts  40 40     Minutes  15 15     Recumbant Elliptical   Level _1 RPM 40 40 40     Minutes _2 REL-XR   Level  3 3     Watts  55 55     Minutes  15 15        Discharge Exercise Prescription (Final Exercise Prescription Changes):     Exercise Prescription Changes - 09/12/15 1500    Exercise Review   Progression No  Absent since last review; last visit 08/02/15   Response to Exercise   Duration Progress to 30 minutes of continuous aerobic without signs/symptoms of physical distress   Intensity Rest + 30   Progression Continue progressive overload as per policy without signs/symptoms or physical distress.   Resistance Training   Training Prescription Yes   Weight 2   Reps 10-15   Interval Training   Interval Training No   NuStep   Level 3   Watts 40   Minutes 15   Recumbant Elliptical   Level 3   RPM 40   Minutes 15   REL-XR   Level 3   Watts 55   Minutes 15      Nutrition:  Target Goals: Understanding of nutrition guidelines, daily intake of sodium <1566m, cholesterol <2028m calories 30% from fat and 7% or less from saturated fats, daily to have 5 or more servings of fruits and vegetables.  Biometrics:     Pre Biometrics - 07/24/15 1554    Pre Biometrics   Height 5' 5.8" (1.671 m)   Weight 169 lb 1.6 oz (76.703 kg)   Waist  Circumference 34.5 inches   Hip Circumference 40 inches   Waist to Hip Ratio 0.86 %   BMI (Calculated) 27.5       Nutrition Therapy Plan and Nutrition Goals:     Nutrition Therapy & Goals - 07/24/15 1939    Nutrition Therapy   Drug/Food Interactions Statins/Certain Fruits   Intervention Plan   Intervention Using nutrition plan and personal goals to gain a healthy nutrition lifestyle. Add exercise as prescribed.      Nutrition Discharge: Rate Your Plate Scores:   Nutrition Goals Re-Evaluation:     Nutrition Goals Re-Evaluation      09/13/15 1420           Personal Goal #1 Re-Evaluation   Goal Progress Seen No  Comments I left vm to see how she was doing and if she was going to come back to Cardiac Rehab. I asked her to let us know either way since we haven't seen her in a while.           Psychosocial: Target Goals: Acknowledge presence or absence of depression, maximize coping skills, provide positive support system. Participant is able to verbalize types and ability to use techniques and skills needed for reducing stress and depression.  Initial Review & Psychosocial Screening:     Initial Psych Review & Screening - 07/24/15 1941    Initial Review   Current issues with Current Anxiety/Panic;Current Stress Concerns   Family Dynamics   Good Support System? Yes   Barriers   Psychosocial barriers to participate in program There are no identifiable barriers or psychosocial needs.;The patient should benefit from training in stress management and relaxation.   Screening Interventions   Interventions Encouraged to exercise;Program counselor consult      Quality of Life Scores:     Quality of Life - 07/24/15 2014    Quality of Life Scores   Health/Function Pre 12.9 %   Socioeconomic Pre 13.36 %   Psych/Spiritual Pre 12.64 %   Family Pre 15.5 %   GLOBAL Pre 13.32 %      PHQ-9:     Recent Review Flowsheet Data    Depression screen [facility] 2/9 07/24/2015    Decreased Interest 2   Down, Depressed, Hopeless 2   PHQ - 2 Score 4   Altered sleeping 3   Tired, decreased energy 1   Change in appetite 1   Feeling bad or failure about yourself  2   Trouble concentrating 0   Moving slowly or fidgety/restless 1   Suicidal thoughts 1   PHQ-9 Score 13   Difficult doing work/chores Somewhat difficult      Psychosocial Evaluation and Intervention:     Psychosocial Evaluation - 07/26/15 1706    Psychosocial Evaluation & Interventions   Interventions Stress management education;Relaxation education;Encouraged to exercise with the program and follow exercise prescription;Physician referral   Comments Counselor met with [name] [name] today for initial psychosocial evaluation.  She is a 33 year old who reports having a heart attack this past April and is also a type 2 Diabetic.  [name] [name] has a strong support system living with her sister, a very helpful 71 year old daughter and a fiance who lives close by.  [name] [name] states she is struggling with her medications currently that are causing a great deal of GI issues as well as sweating, dizziness, low blood sugars, blurred vision and she is also having difficulty sleeping at night - stating possibly 5 hours/night on a good night - falling asleep around 7AM and sleeping until mid-day.  [name] [name] also reports a history of anxiety and panic symptoms for the past 6 years -never fully diagnosed or treated.  She admits to some irritability symptoms as well with some depressive symptoms currently.  Therapist provided [name] [name] with information to see a psychiatrist for a medication evaluation for these mental health symptoms.  She is encouraged to continue speaking with her current Drs about the medication side effects that are causing the other negative symptoms.  [name] [name] has goals to get healthier and increase her stamina and strength while in this program.  Counselor will follow up with her concerning these recommendations.   Continued  Psychosocial Services Needed Yes  [name] [name] is recommended  to see a psychiatrist for a medication evaluation due to a history of untreated panic atttacks and some current symptoms.  She will also benefit from the psychoeducational components of this program as well as consistent exercise.      Psychosocial Re-Evaluation:     Psychosocial Re-Evaluation      07/31/15 1706 09/13/15 1420         Psychosocial Re-Evaluation   Interventions  Encouraged to attend Cardiac Rehabilitation for the exercise      Comments Follow up with [name] [name] today reporting she had an initial assessment with the psychiatric group but will not see them again until after she sees her Dr. for possible medications for depressive and anxiety symptoms.  Counselor assessed sleep with [name] [name] continuing to report difficulty getting to sleep is ongoing.  Counselor suggested talking with Pharmacist or doctor about an OTC natural sleep aid to see if this might help in the meantime until the psychiatrist is able to collaborate with her PCP.  Counselor will continue to follow with [name] [name]. while in this program.   I left vm to see how she was doing and if she was going to come back to Cardiac Rehab. I asked her to let us know either way since we haven't seen her in a while.          Vocational Rehabilitation: Provide vocational rehab assistance to qualifying candidates.   Vocational Rehab Evaluation & Intervention:     Vocational Rehab - 07/24/15 1934    Initial Vocational Rehab Evaluation & Intervention   Assessment shows need for Vocational Rehabilitation No      Education: Education Goals: Education classes will be provided on a weekly basis, covering required topics. Participant will state understanding/return demonstration of topics presented.  Learning Barriers/Preferences:     Learning Barriers/Preferences - 07/24/15 1933    Learning Barriers/Preferences   Learning Barriers Sight   Learning Preferences Written Material       Education Topics: General Nutrition Guidelines/Fats and Fiber: -Group instruction provided by verbal, written material, models and posters to present the general guidelines for heart healthy nutrition. Gives an explanation and review of dietary fats and fiber.   Controlling Sodium/Reading Food Labels: -Group verbal and written material supporting the discussion of sodium use in heart healthy nutrition. Review and explanation with models, verbal and written materials for utilization of the food label.          Cardiac Rehab from 08/02/2015 in [facility] Cardiac Rehab   Date  07/31/15   Educator  PI   Instruction Review Code  2- meets goals/outcomes      Exercise Physiology & Risk Factors: - Group verbal and written instruction with models to review the exercise physiology of the cardiovascular system and associated critical values. Details cardiovascular disease risk factors and the goals associated with each risk factor.   Aerobic Exercise & Resistance Training: - Gives group verbal and written discussion on the health impact of inactivity. On the components of aerobic and resistive training programs and the benefits of this training and how to safely progress through these programs.   Flexibility, Balance, General Exercise Guidelines: - Provides group verbal and written instruction on the benefits of flexibility and balance training programs. Provides general exercise guidelines with specific guidelines to those with heart or lung disease. Demonstration and skill practice provided.   Stress Management: - Provides group verbal and written instruction about the health risks of elevated stress, cause of high stress, and healthy ways  to reduce stress.   Depression: - Provides group verbal and written instruction on the correlation between heart/lung disease and depressed mood, treatment options, and the stigmas associated with seeking treatment.      Cardiac Rehab from 08/02/2015 in  [facility] Cardiac Rehab   Date  08/02/15   Educator  [facility]   Instruction Review Code  2- meets goals/outcomes      Anatomy & Physiology of the Heart: - Group verbal and written instruction and models provide basic cardiac anatomy and physiology, with the coronary electrical and arterial systems. Review of: AMI, Angina, Valve disease, Heart Failure, Cardiac Arrhythmia, Pacemakers, and the ICD.   Cardiac Procedures: - Group verbal and written instruction and models to describe the testing methods done to diagnose heart disease. Reviews the outcomes of the test results. Describes the treatment choices: Medical Management, Angioplasty, or Coronary Bypass Surgery.   Cardiac Medications: - Group verbal and written instruction to review commonly prescribed medications for heart disease. Reviews the medication, class of the drug, and side effects. Includes the steps to properly store meds and maintain the prescription regimen.   Go Sex-Intimacy & Heart Disease, Get SMART - Goal Setting: - Group verbal and written instruction through game format to discuss heart disease and the return to sexual intimacy. Provides group verbal and written material to discuss and apply goal setting through the application of the S.M.A.R.T. Method.   Other Matters of the Heart: - Provides group verbal, written materials and models to describe Heart Failure, Angina, Valve Disease, and Diabetes in the realm of heart disease. Includes description of the disease process and treatment options available to the cardiac patient.   Exercise & Equipment Safety: - Individual verbal instruction and demonstration of equipment use and safety with use of the equipment.      Cardiac Rehab from 08/02/2015 in [facility] Cardiac Rehab   Date  07/24/15   Educator  C. Shaneeka Scarboro,RN   Instruction Review Code  1- partially meets, needs review/practice      Infection Prevention: - Provides verbal and written material to individual with discussion  of infection control including proper hand washing and proper equipment cleaning during exercise session.      Cardiac Rehab from 08/02/2015 in [facility] Cardiac Rehab   Date  07/24/15   Educator  Jimmie Molly   Instruction Review Code  2- meets goals/outcomes      Falls Prevention: - Provides verbal and written material to individual with discussion of falls prevention and safety.      Cardiac Rehab from 08/02/2015 in [facility] Cardiac Rehab   Date  07/24/15   Educator  C. Norely Schlick,RN   Instruction Review Code  2- meets goals/outcomes      Diabetes: - Individual verbal and written instruction to review signs/symptoms of diabetes, desired ranges of glucose level fasting, after meals and with exercise. Advice that pre and post exercise glucose checks will be done for 3 sessions at entry of program.      Cardiac Rehab from 08/02/2015 in [facility] Cardiac Rehab   Date  07/24/15   Educator  C. Parul Porcelli,RN   Instruction Review Code  1- partially meets, needs review/practice       Knowledge Questionnaire Score:     Knowledge Questionnaire Score - 07/24/15 1933    Knowledge Questionnaire Score   Pre Score 24      Personal Goals and Risk Factors at Admission:     Personal Goals and Risk Factors at Admission - 07/24/15 1939  Personal Goals and Risk Factors on Admission    Weight Management Yes   Intervention Learn and follow the exercise and diet guidelines while in the program. Utilize the nutrition and education classes to help gain knowledge of the diet and exercise expectations in the program   Increase Aerobic Exercise and Physical Activity Yes   Intervention While in program, learn and follow the exercise prescription taught. Start at a low level workload and increase workload after able to maintain previous level for 30 minutes. Increase time before increasing intensity.   Take Less Medication Yes   Intervention Learn your risk factors and begin the lifestyle modifications for risk factor  control during your time in the program.   Understand more about Heart/Pulmonary Disease. Yes   Intervention While in program utilize professionals for any questions, and attend the education sessions. Great websites to use are www.americanheart.org or www.lung.org for reliable information.   Diabetes Yes   Goal Blood glucose control identified by blood glucose values, HgbA1C. Participant verbalizes understanding of the signs/symptoms of hyper/hypo glycemia, proper foot care and importance of medication and nutrition plan for blood glucose control.   Intervention Provide nutrition & aerobic exercise along with prescribed medications to achieve blood glucose in normal ranges: Fasting 65-99 mg/dL   Hypertension Yes   Goal Participant will see blood pressure controlled within the values of 140/47m/Hg or within value directed by their physician.   Intervention Provide nutrition & aerobic exercise along with prescribed medications to achieve BP 140/90 or less.   Lipids Yes   Goal Cholesterol controlled with medications as prescribed, with individualized exercise RX and with personalized nutrition plan. Value goals: LDL < 738m HDL > 40103mParticipant states understanding of desired cholesterol values and following prescriptions.   Intervention Provide nutrition & aerobic exercise along with prescribed medications to achieve LDL <71m29mDL >40mg10mStress Yes   Goal To meet with psychosocial counselor for stress and relaxation information and guidance. To state understanding of performing relaxation techniques and or identifying personal stressors.   Intervention Provide education on types of stress, identifiying stressors, and ways to cope with stress. Provide demonstration and active practice of relaxation techniques.      Personal Goals and Risk Factors Review:      Goals and Risk Factor Review      09/13/15 1419           Increase Aerobic Exercise and Physical Activity   Goals  Progress/Improvement seen  No       Comments I left vm to see how she was doing and if she was going to come back to Cardiac Rehab. I asked her to [place] us [place] either way since we haven't seen her in a while.           Personal Goals Discharge (Final Personal Goals and Risk Factors Review):      Goals and Risk Factor Review - 09/13/15 1419    Increase Aerobic Exercise and Physical Activity   Goals Progress/Improvement seen  No   Comments I left vm to see how she was doing and if she was going to come back to Cardiac Rehab. I asked her to let us [place] either way since we haven't seen her in a while.        Comments:

## 2015-09-20 NOTE — Addendum Note (Signed)
Addended by: Gerlene Burdock on: 09/20/2015 02:27 PM   Modules accepted: Orders

## 2015-09-25 ENCOUNTER — Other Ambulatory Visit: Payer: Self-pay | Admitting: Unknown Physician Specialty

## 2015-09-25 MED ORDER — ATORVASTATIN CALCIUM 40 MG PO TABS: 40.0000 mg | ORAL_TABLET | Freq: Every day | ORAL | Status: DC

## 2015-09-25 MED ORDER — GLIMEPIRIDE 4 MG PO TABS: 4.0000 mg | ORAL_TABLET | Freq: Every day | ORAL | Status: DC

## 2015-09-25 MED ORDER — PANTOPRAZOLE SODIUM 40 MG PO TBEC: 40.0000 mg | DELAYED_RELEASE_TABLET | Freq: Every day | ORAL | Status: DC

## 2015-09-25 NOTE — Telephone Encounter (Signed)
Patient was last seen 08/30/15 and pharmacy is [facility].

## 2015-09-25 NOTE — Telephone Encounter (Signed)
Pt would like a refill for atorvastatin, amaryl and protonix sent to [address]

## 2015-10-09 ENCOUNTER — Encounter: Payer: Self-pay | Admitting: *Deleted

## 2015-10-11 ENCOUNTER — Encounter: Payer: Medicaid Other | Attending: Cardiovascular Disease

## 2015-10-11 ENCOUNTER — Telehealth: Payer: Self-pay | Admitting: *Deleted

## 2015-10-11 DIAGNOSIS — I252 Old myocardial infarction: Secondary | ICD-10-CM | POA: Insufficient documentation

## 2015-10-11 NOTE — Telephone Encounter (Signed)
Spoke with [name] [name] today.  She recently had blood transfusion in Sept. Remains with low blood count. Expects to see MD soon to followup on labs and the transfusion.

## 2015-10-17 ENCOUNTER — Encounter: Payer: Self-pay | Admitting: *Deleted

## 2015-10-17 DIAGNOSIS — Z95820 Peripheral vascular angioplasty status with implants and grafts: Secondary | ICD-10-CM

## 2015-10-17 DIAGNOSIS — I252 Old myocardial infarction: Secondary | ICD-10-CM

## 2015-10-17 NOTE — Progress Notes (Signed)
Cardiac Individual Treatment Plan  Patient Details  Name: [name] [name] MRN: [id number] Date of Birth: 1981-08-13 Referring Provider:  Lorelee Market, MD  Initial Encounter Date:    Visit Diagnosis: Status post non-ST elevation myocardial infarction (NSTEMI)  S/P angioplasty with stent  Patient's Home Medications on Admission:  Current outpatient prescriptions:  .  aspirin EC 81 MG tablet, Take 81 mg by mouth daily., Disp: , Rfl:  .  atorvastatin (LIPITOR) 40 MG tablet, Take 1 tablet (40 mg total) by mouth daily at 6 PM., Disp: 30 tablet, Rfl: 0 .  ferrous sulfate 325 (65 FE) MG tablet, Take 1 tablet (325 mg total) by mouth 3 (three) times daily with meals., Disp: 90 tablet, Rfl: 3 .  glimepiride (AMARYL) 4 MG tablet, Take 1 tablet (4 mg total) by mouth daily., Disp: 90 tablet, Rfl: 1 .  losartan (COZAAR) 50 MG tablet, Take 1 tablet (50 mg total) by mouth daily., Disp: 30 tablet, Rfl: 3 .  mirtazapine (REMERON) 15 MG tablet, Take 15 mg by mouth at bedtime., Disp: , Rfl:  .  nitroGLYCERIN (NITROSTAT) 0.4 MG SL tablet, Place 0.4 mg under the tongue every 5 (five) minutes as needed for chest pain., Disp: , Rfl:  .  norethindrone-ethinyl estradiol 1/35 (ORTHO-NOVUM, NORTREL,CYCLAFEM) tablet, Patient will take 2 tablet daily x 5 days then daily, Disp: , Rfl:  .  pantoprazole (PROTONIX) 40 MG tablet, Take 1 tablet (40 mg total) by mouth daily at 6 (six) AM., Disp: 90 tablet, Rfl: 1 .  ticagrelor (BRILINTA) 90 MG TABS tablet, Take 1 tablet (90 mg total) by mouth 2 (two) times daily., Disp: 60 tablet, Rfl: 6 .  valsartan (DIOVAN) 80 MG tablet, Take 1 tablet (80 mg total) by mouth daily., Disp: 30 tablet, Rfl: 1  Past Medical History: Past Medical History  Diagnosis Date  . CAD (coronary artery disease)     a. NSTEMI 03/2015: mLCx 100% s/p PCI/DES, RI 90% s/p PCI/DES, EF 35%; b. 04/2015 Relook Cath: LM nl, LAD 40p, RI patent stent, LCX patent stent, RCA nl, EF 55-65%.  . Chronic systolic  CHF (congestive heart failure)     a. echo 03/2015: EF 30-35%, mild concentric LVH, severe HK of inf, inflat, & lat walls, mod MR, mild TR;  b. 04/2015 LV Gram: EF 55-65%.  . Ischemic cardiomyopathy     a. 03/2015 EF 30-35% post NSTEMI;  b. 04/2015 EF 55-65% on LV gram.  . DM2 (diabetes mellitus, type 2)     a. since 2002  . Tobacco abuse     a. quit 03/2015.  . Obesity   . Hyperlipidemia   . Hypotension     a. resulting in discontinuation of losartan and reduction in coreg dose.  Marland Kitchen Heavy menses     a. H/O IUD - expired in 2014 - remains in place.  . Iron deficiency anemia     Tobacco Use: History  Smoking status  . Former Smoker -- 15 years  . Quit date: 03/10/2015  Smokeless tobacco  . Never Used    Labs: Recent Review Flowsheet Data    Labs for ITP Cardiac and Pulmonary Rehab Latest Ref Rng 03/31/2015 05/05/2015 05/16/2015   Cholestrol 100 - 199 mg/dL 133 - 93(L)   LDLCALC 0 - 99 mg/dL 92 - 46   HDL >39 mg/dL 26(L) - 32(L)   Trlycerides 0 - 149 mg/dL 77 - 73   Hemoglobin A1c 4.8 - 5.6 % 14.5(H) 14.2(H) -  POCT Glucose      07/24/15 1406           POCT Blood Glucose   Pre-Exercise 158 mg/dL          Exercise Target Goals:    Exercise Program Goal: Individual exercise prescription set with THRR, safety & activity barriers. Participant demonstrates ability to understand and report RPE using BORG scale, to self-measure pulse accurately, and to acknowledge the importance of the exercise prescription.  Exercise Prescription Goal: Starting with aerobic activity 30 plus minutes a day, 3 days per week for initial exercise prescription. Provide home exercise prescription and guidelines that participant acknowledges understanding prior to discharge.  Activity Barriers & Risk Stratification:     Activity Barriers & Risk Stratification - 07/24/15 1933    Activity Barriers & Risk Stratification   Activity Barriers None   Risk Stratification High      6 Minute  Walk:     6 Minute Walk      07/24/15 1546       6 Minute Walk   Phase Initial     Distance 326 feet  NS test: 326 steps     Walk Time 6 minutes     Resting HR 116 bpm     Resting BP 100/70 mmHg     Max Ex. HR 111 bpm     Max Ex. BP 110/60 mmHg     RPE 7     Symptoms No        Initial Exercise Prescription:     Initial Exercise Prescription - 07/24/15 1500    Date of Initial Exercise Prescription   Date 07/24/15   Bike   Level 1   Minutes 15   Recumbant Bike   Level 3   RPM 40   Watts 30   Minutes 15   NuStep   Level 3   Watts 40   Minutes 15   Arm Ergometer   Level 1   Watts 10   Minutes 15   Arm/Foot Ergometer   Level 2   Watts 15   Minutes 15   Cybex   Level 2   RPM 40   Minutes 15   Recumbant Elliptical   Level 1   RPM 30   Watts 20   Minutes 15   REL-XR   Level 3   Watts 30   Minutes 15   Prescription Details   Frequency (times per week) 3   Duration Progress to 30 minutes of continuous aerobic without signs/symptoms of physical distress   Intensity   THRR REST +  20   Ratings of Perceived Exertion 11-15   Progression Continue progressive overload as per policy without signs/symptoms or physical distress.   Resistance Training   Training Prescription Yes   Weight 2   Reps 10-12      Exercise Prescription Changes:     Exercise Prescription Changes      08/02/15 1700 08/21/15 0600 09/12/15 1500 10/09/15 1100     Exercise Review   Progression Yes Yes No  Absent since last review; last visit 08/02/15 No  Absent since last review; last visit 08/02/15    Response to Exercise   Blood Pressure (Admit)  112/62 mmHg      Blood Pressure (Exercise)  124/60 mmHg      Blood Pressure (Exit)  104/68 mmHg      Heart Rate (Admit)  69 bpm      Heart Rate (Exercise)  121 bpm  Heart Rate (Exit)  108 bpm      Rating of Perceived Exertion (Exercise)  13      Symptoms Some nausea        Duration Progress to 30 minutes of continuous aerobic  without signs/symptoms of physical distress Progress to 30 minutes of continuous aerobic without signs/symptoms of physical distress Progress to 30 minutes of continuous aerobic without signs/symptoms of physical distress Progress to 30 minutes of continuous aerobic without signs/symptoms of physical distress    Intensity Rest + 30 Rest + 30 Rest + 30 Rest + 30    Progression Continue progressive overload as per policy without signs/symptoms or physical distress. Continue progressive overload as per policy without signs/symptoms or physical distress. Continue progressive overload as per policy without signs/symptoms or physical distress. Continue progressive overload as per policy without signs/symptoms or physical distress.    Resistance Training   Training Prescription Yes Yes Yes Yes    Weight $Remov'2 2 2 2    'XteNfO$ Reps 10-15 10-15 10-15 10-15    Interval Training   Interval Training No No No No    NuStep   Level  $Remo'3 3 3    'bvIzV$ Watts  40 40 40    Minutes  $Remove'15 15 15    'UKELrsq$ Recumbant Elliptical   Level $Remo'3 3 3 3    'JaTAZ$ RPM 40 40 40 40    Minutes $Remove'15 15 15 15    'GGxqgMB$ REL-XR   Level  $Remo'3 3 3    'bOpEg$ Watts  55 55 55    Minutes  $Remove'15 15 15       'rtsNHXE$ Discharge Exercise Prescription (Final Exercise Prescription Changes):     Exercise Prescription Changes - 10/09/15 1100    Exercise Review   Progression No  Absent since last review; last visit 08/02/15   Response to Exercise   Duration Progress to 30 minutes of continuous aerobic without signs/symptoms of physical distress   Intensity Rest + 30   Progression Continue progressive overload as per policy without signs/symptoms or physical distress.   Resistance Training   Training Prescription Yes   Weight 2   Reps 10-15   Interval Training   Interval Training No   NuStep   Level 3   Watts 40   Minutes 15   Recumbant Elliptical   Level 3   RPM 40   Minutes 15   REL-XR   Level 3   Watts 55   Minutes 15      Nutrition:  Target Goals: Understanding of nutrition  guidelines, daily intake of sodium '1500mg'$ , cholesterol '200mg'$ , calories 30% from fat and 7% or less from saturated fats, daily to have 5 or more servings of fruits and vegetables.  Biometrics:     Pre Biometrics - 07/24/15 1554    Pre Biometrics   Height 5' 5.8" (1.671 m)   Weight 169 lb 1.6 oz (76.703 kg)   Waist Circumference 34.5 inches   Hip Circumference 40 inches   Waist to Hip Ratio 0.86 %   BMI (Calculated) 27.5       Nutrition Therapy Plan and Nutrition Goals:     Nutrition Therapy & Goals - 07/24/15 1939    Nutrition Therapy   Drug/Food Interactions Statins/Certain Fruits   Intervention Plan   Intervention Using nutrition plan and personal goals to gain a healthy nutrition lifestyle. Add exercise as prescribed.      Nutrition Discharge: Rate Your Plate Scores:   Nutrition Goals Re-Evaluation:  Nutrition Goals Re-Evaluation      09/13/15 1420           Personal Goal #1 Re-Evaluation   Goal Progress Seen No       Comments I left vm to see how she was doing and if she was going to come back to Cardiac Rehab. I asked her to let us know either way since we haven't seen her in a while.           Psychosocial: Target Goals: Acknowledge presence or absence of depression, maximize coping skills, provide positive support system. Participant is able to verbalize types and ability to use techniques and skills needed for reducing stress and depression.  Initial Review & Psychosocial Screening:     Initial Psych Review & Screening - 07/24/15 1941    Initial Review   Current issues with Current Anxiety/Panic;Current Stress Concerns   Family Dynamics   Good Support System? Yes   Barriers   Psychosocial barriers to participate in program There are no identifiable barriers or psychosocial needs.;The patient should benefit from training in stress management and relaxation.   Screening Interventions   Interventions Encouraged to exercise;Program counselor consult       Quality of Life Scores:     Quality of Life - 07/24/15 2014    Quality of Life Scores   Health/Function Pre 12.9 %   Socioeconomic Pre 13.36 %   Psych/Spiritual Pre 12.64 %   Family Pre 15.5 %   GLOBAL Pre 13.32 %      PHQ-9:     Recent Review Flowsheet Data    Depression screen [facility] 2/9 07/24/2015   Decreased Interest 2   Down, Depressed, Hopeless 2   PHQ - 2 Score 4   Altered sleeping 3   Tired, decreased energy 1   Change in appetite 1   Feeling bad or failure about yourself  2   Trouble concentrating 0   Moving slowly or fidgety/restless 1   Suicidal thoughts 1   PHQ-9 Score 13   Difficult doing work/chores Somewhat difficult      Psychosocial Evaluation and Intervention:     Psychosocial Evaluation - 07/26/15 1706    Psychosocial Evaluation & Interventions   Interventions Stress management education;Relaxation education;Encouraged to exercise with the program and follow exercise prescription;Physician referral   Comments Counselor met with Ms. Helvey today for initial psychosocial evaluation.  She is a 34 year old who reports having a heart attack this past April and is also a type 2 Diabetic.  Ms. B has a strong support system living with her sister, a very helpful 71 year old daughter and a fiance who lives close by.  Ms. B states she is struggling with her medications currently that are causing a great deal of GI issues as well as sweating, dizziness, low blood sugars, blurred vision and she is also having difficulty sleeping at night - stating possibly 5 hours/night on a good night - falling asleep around 7AM and sleeping until mid-day.  Ms. B also reports a history of anxiety and panic symptoms for the past 6 years -never fully diagnosed or treated.  She admits to some irritability symptoms as well with some depressive symptoms currently.  Therapist provided Ms. B with information to see a psychiatrist for a medication evaluation for these mental health  symptoms.  She is encouraged to continue speaking with her current Drs about the medication side effects that are causing the other negative symptoms.  Ms. B has  goals to get healthier and increase her stamina and strength while in this program.  Counselor will follow up with her concerning these recommendations.   Continued Psychosocial Services Needed Yes  Ms. B is recommended to see a psychiatrist for a medication evaluation due to a history of untreated panic atttacks and some current symptoms.  She will also benefit from the psychoeducational components of this program as well as consistent exercise.      Psychosocial Re-Evaluation:     Psychosocial Re-Evaluation      07/31/15 1706 09/13/15 1420         Psychosocial Re-Evaluation   Interventions  Encouraged to attend Cardiac Rehabilitation for the exercise      Comments Follow up with Ms. B today reporting she had an initial assessment with the psychiatric group but will not see them again until after she sees her Dr. for possible medications for depressive and anxiety symptoms.  Counselor assessed sleep with Ms. B continuing to report difficulty getting to sleep is ongoing.  Counselor suggested talking with Pharmacist or doctor about an OTC natural sleep aid to see if this might help in the meantime until the psychiatrist is able to collaborate with her PCP.  Counselor will continue to follow with Ms. B. while in this program.   I left vm to see how she was doing and if she was going to come back to Cardiac Rehab. I asked her to let us know either way since we haven't seen her in a while.          Vocational Rehabilitation: Provide vocational rehab assistance to qualifying candidates.   Vocational Rehab Evaluation & Intervention:     Vocational Rehab - 07/24/15 1934    Initial Vocational Rehab Evaluation & Intervention   Assessment shows need for Vocational Rehabilitation No      Education: Education Goals: Education classes  will be provided on a weekly basis, covering required topics. Participant will state understanding/return demonstration of topics presented.  Learning Barriers/Preferences:     Learning Barriers/Preferences - 07/24/15 1933    Learning Barriers/Preferences   Learning Barriers Sight   Learning Preferences Written Material      Education Topics: General Nutrition Guidelines/Fats and Fiber: -Group instruction provided by verbal, written material, models and posters to present the general guidelines for heart healthy nutrition. Gives an explanation and review of dietary fats and fiber.   Controlling Sodium/Reading Food Labels: -Group verbal and written material supporting the discussion of sodium use in heart healthy nutrition. Review and explanation with models, verbal and written materials for utilization of the food label.          Cardiac Rehab from 08/02/2015 in [facility] Cardiac Rehab   Date  07/31/15   Educator  PI   Instruction Review Code  2- meets goals/outcomes      Exercise Physiology & Risk Factors: - Group verbal and written instruction with models to review the exercise physiology of the cardiovascular system and associated critical values. Details cardiovascular disease risk factors and the goals associated with each risk factor.   Aerobic Exercise & Resistance Training: - Gives group verbal and written discussion on the health impact of inactivity. On the components of aerobic and resistive training programs and the benefits of this training and how to safely progress through these programs.   Flexibility, Balance, General Exercise Guidelines: - Provides group verbal and written instruction on the benefits of flexibility and balance training programs. Provides general exercise guidelines with specific guidelines to  those with heart or lung disease. Demonstration and skill practice provided.   Stress Management: - Provides group verbal and written instruction about the  health risks of elevated stress, cause of high stress, and healthy ways to reduce stress.   Depression: - Provides group verbal and written instruction on the correlation between heart/lung disease and depressed mood, treatment options, and the stigmas associated with seeking treatment.      Cardiac Rehab from 08/02/2015 in [facility] Cardiac Rehab   Date  08/02/15   Educator  [facility]   Instruction Review Code  2- meets goals/outcomes      Anatomy & Physiology of the Heart: - Group verbal and written instruction and models provide basic cardiac anatomy and physiology, with the coronary electrical and arterial systems. Review of: AMI, Angina, Valve disease, Heart Failure, Cardiac Arrhythmia, Pacemakers, and the ICD.   Cardiac Procedures: - Group verbal and written instruction and models to describe the testing methods done to diagnose heart disease. Reviews the outcomes of the test results. Describes the treatment choices: Medical Management, Angioplasty, or Coronary Bypass Surgery.   Cardiac Medications: - Group verbal and written instruction to review commonly prescribed medications for heart disease. Reviews the medication, class of the drug, and side effects. Includes the steps to properly store meds and maintain the prescription regimen.   Go Sex-Intimacy & Heart Disease, Get SMART - Goal Setting: - Group verbal and written instruction through game format to discuss heart disease and the return to sexual intimacy. Provides group verbal and written material to discuss and apply goal setting through the application of the S.M.A.R.T. Method.   Other Matters of the Heart: - Provides group verbal, written materials and models to describe Heart Failure, Angina, Valve Disease, and Diabetes in the realm of heart disease. Includes description of the disease process and treatment options available to the cardiac patient.   Exercise & Equipment Safety: - Individual verbal instruction and demonstration  of equipment use and safety with use of the equipment.      Cardiac Rehab from 08/02/2015 in [facility] Cardiac Rehab   Date  07/24/15   Educator  C. Enterkin,RN   Instruction Review Code  1- partially meets, needs review/practice      Infection Prevention: - Provides verbal and written material to individual with discussion of infection control including proper hand washing and proper equipment cleaning during exercise session.      Cardiac Rehab from 08/02/2015 in [facility] Cardiac Rehab   Date  07/24/15   Educator  Jimmie Molly   Instruction Review Code  2- meets goals/outcomes      Falls Prevention: - Provides verbal and written material to individual with discussion of falls prevention and safety.      Cardiac Rehab from 08/02/2015 in [facility] Cardiac Rehab   Date  07/24/15   Educator  C. Enterkin,RN   Instruction Review Code  2- meets goals/outcomes      Diabetes: - Individual verbal and written instruction to review signs/symptoms of diabetes, desired ranges of glucose level fasting, after meals and with exercise. Advice that pre and post exercise glucose checks will be done for 3 sessions at entry of program.      Cardiac Rehab from 08/02/2015 in [facility] Cardiac Rehab   Date  07/24/15   Educator  C. Enterkin,RN   Instruction Review Code  1- partially meets, needs review/practice       Knowledge Questionnaire Score:     Knowledge Questionnaire Score - 07/24/15 1933    Knowledge  Questionnaire Score   Pre Score 24      Personal Goals and Risk Factors at Admission:     Personal Goals and Risk Factors at Admission - 07/24/15 1939    Personal Goals and Risk Factors on Admission    Weight Management Yes   Intervention Learn and follow the exercise and diet guidelines while in the program. Utilize the nutrition and education classes to help gain knowledge of the diet and exercise expectations in the program   Increase Aerobic Exercise and Physical Activity Yes   Intervention While in  program, learn and follow the exercise prescription taught. Start at a low level workload and increase workload after able to maintain previous level for 30 minutes. Increase time before increasing intensity.   Take Less Medication Yes   Intervention Learn your risk factors and begin the lifestyle modifications for risk factor control during your time in the program.   Understand more about Heart/Pulmonary Disease. Yes   Intervention While in program utilize professionals for any questions, and attend the education sessions. Great websites to use are www.americanheart.org or www.lung.org for reliable information.   Diabetes Yes   Goal Blood glucose control identified by blood glucose values, HgbA1C. Participant verbalizes understanding of the signs/symptoms of hyper/hypo glycemia, proper foot care and importance of medication and nutrition plan for blood glucose control.   Intervention Provide nutrition & aerobic exercise along with prescribed medications to achieve blood glucose in normal ranges: Fasting 65-99 mg/dL   Hypertension Yes   Goal Participant will see blood pressure controlled within the values of 140/78mm/Hg or within value directed by their physician.   Intervention Provide nutrition & aerobic exercise along with prescribed medications to achieve BP 140/90 or less.   Lipids Yes   Goal Cholesterol controlled with medications as prescribed, with individualized exercise RX and with personalized nutrition plan. Value goals: LDL < $Rem'70mg'vAWC$ , HDL > $Rem'40mg'bcen$ . Participant states understanding of desired cholesterol values and following prescriptions.   Intervention Provide nutrition & aerobic exercise along with prescribed medications to achieve LDL '70mg'$ , HDL >$Remo'40mg'DrxGA$ .   Stress Yes   Goal To meet with psychosocial counselor for stress and relaxation information and guidance. To state understanding of performing relaxation techniques and or identifying personal stressors.   Intervention Provide education  on types of stress, identifiying stressors, and ways to cope with stress. Provide demonstration and active practice of relaxation techniques.      Personal Goals and Risk Factors Review:      Goals and Risk Factor Review      09/13/15 1419           Increase Aerobic Exercise and Physical Activity   Goals Progress/Improvement seen  No       Comments I left vm to see how she was doing and if she was going to come back to Cardiac Rehab. I asked her to let us know either way since we haven't seen her in a while.           Personal Goals Discharge (Final Personal Goals and Risk Factors Review):      Goals and Risk Factor Review - 09/13/15 1419    Increase Aerobic Exercise and Physical Activity   Goals Progress/Improvement seen  No   Comments I left vm to see how she was doing and if she was going to come back to Cardiac Rehab. I asked her to let us know either way since we haven't seen her in a while.  Comments: 30 day review. Continue with ITP. Absent for medical reasons

## 2015-10-18 ENCOUNTER — Other Ambulatory Visit: Payer: Self-pay | Admitting: *Deleted

## 2015-10-18 DIAGNOSIS — Z95820 Peripheral vascular angioplasty status with implants and grafts: Secondary | ICD-10-CM

## 2015-10-23 ENCOUNTER — Encounter: Payer: Self-pay | Admitting: Cardiovascular Disease

## 2015-10-23 ENCOUNTER — Ambulatory Visit (INDEPENDENT_AMBULATORY_CARE_PROVIDER_SITE_OTHER): Payer: Medicaid Other | Admitting: Cardiovascular Disease

## 2015-10-23 VITALS — BP 138/88 | HR 96 | Ht 64.0 in | Wt 183.5 lb

## 2015-10-23 DIAGNOSIS — R079 Chest pain, unspecified: Secondary | ICD-10-CM | POA: Diagnosis not present

## 2015-10-23 DIAGNOSIS — I2510 Atherosclerotic heart disease of native coronary artery without angina pectoris: Secondary | ICD-10-CM | POA: Diagnosis not present

## 2015-10-23 DIAGNOSIS — I509 Heart failure, unspecified: Secondary | ICD-10-CM

## 2015-10-23 DIAGNOSIS — I5022 Chronic systolic (congestive) heart failure: Secondary | ICD-10-CM

## 2015-10-23 DIAGNOSIS — E785 Hyperlipidemia, unspecified: Secondary | ICD-10-CM

## 2015-10-23 MED ORDER — LOSARTAN POTASSIUM 50 MG PO TABS: 50.0000 mg | ORAL_TABLET | Freq: Every day | ORAL | Status: DC

## 2015-10-23 MED ORDER — CARVEDILOL 3.125 MG PO TABS: 3.1250 mg | ORAL_TABLET | Freq: Two times a day (BID) | ORAL | Status: DC

## 2015-10-23 NOTE — Assessment & Plan Note (Signed)
She is doing very well with no symptoms suggestive of angina.she is now tolerating dual antiplatelet therapy with no significant menstrual bleeding after she was placed on birth control pills.  I am going to check her CBC. If hemoglobin is improved to 10, she can resume cardiac rehabilitation.

## 2015-10-23 NOTE — Assessment & Plan Note (Signed)
Most recent ejection fraction improved to 50%. Blood pressure is now stable and thus I resumed small dose carvedilol 3.125 mg twice daily. She is already on losartan.

## 2015-10-23 NOTE — Progress Notes (Signed)
HPI  This is a 34 year old African American female who is here today for a follow-up visit regarding coronary artery disease and chronic systolic heart failure.  She has prolonged history of type 2 diabetes since 2002 , prolonged history of tobacco use, and obesity.  She presented to [facility] in April of this year with shortness of breath, and substernal chest tightness and lightheadedness. She started feeling unusual about 2 days prior to presentation with a headache, fever, and possible cold. She was noted to have mildly elevated troponin. EKG showed no acute ST or T wave changes. She was noted to have sinus tachycardia with evidence of pulmonary edema on chest x-ray. She was hypoxic, but improved with diuresis.  Echocardiogram showed an ejection fraction of 30% with severe inferior, inferolateral and lateral wall hypokinesis with moderate mitral regurgitation. I proceeded with cardiac catheterization  which showed occluded mid left circumflex with significant disease affecting the proximal OM1. There was mild disease affecting the LAD and minor irregularities in the RCA. Ejection fraction was 35%. I performed successful angioplasty and drug-eluting stent placement to the mid LCX and proximal OM without complications.  She was rehospitalized at [facility] in May for intermittent chest pain. She underwent repeat cardiac catheterization which showed patent stents in the left circumflex and OM1. Ejection fraction was 50-55%.  She was hospitalized in September with symptomatic anemia due to heavy periods. This required transfusion. She was diagnosed with uterine fibroid and was placed on birth control pills with significant improvement in bleeding. She also has been taking ferrous sulfate and reports improvement in symptoms. Cardiac rehabilitation was placed on hold due to his dizziness when she was anemic. Blood pressure actually has been running on the high side recently. Losartan was resumed by her primary care  physician especially that she has proteinuria.   Allergies  Allergen Reactions  . Lisinopril Itching and Swelling  . Shellfish Allergy Itching and Swelling     Current Outpatient Prescriptions on File Prior to Visit  Medication Sig Dispense Refill  . aspirin EC 81 MG tablet Take 81 mg by mouth daily.    Marland Kitchen atorvastatin (LIPITOR) 40 MG tablet Take 1 tablet (40 mg total) by mouth daily at 6 PM. 30 tablet 0  . ferrous sulfate 325 (65 FE) MG tablet Take 1 tablet (325 mg total) by mouth 3 (three) times daily with meals. 90 tablet 3  . glimepiride (AMARYL) 4 MG tablet Take 1 tablet (4 mg total) by mouth daily. 90 tablet 1  . losartan (COZAAR) 50 MG tablet Take 1 tablet (50 mg total) by mouth daily. 30 tablet 3  . mirtazapine (REMERON) 15 MG tablet Take 15 mg by mouth at bedtime.    . nitroGLYCERIN (NITROSTAT) 0.4 MG SL tablet Place 0.4 mg under the tongue every 5 (five) minutes as needed for chest pain.    Marland Kitchen norethindrone-ethinyl estradiol 1/35 (ORTHO-NOVUM, NORTREL,CYCLAFEM) tablet Patient will take 2 tablet daily x 5 days then daily    . pantoprazole (PROTONIX) 40 MG tablet Take 1 tablet (40 mg total) by mouth daily at 6 (six) AM. 90 tablet 1  . ticagrelor (BRILINTA) 90 MG TABS tablet Take 1 tablet (90 mg total) by mouth 2 (two) times daily. 60 tablet 6   No current facility-administered medications on file prior to visit.     Past Medical History  Diagnosis Date  . CAD (coronary artery disease)     a. NSTEMI 03/2015: mLCx 100% s/p PCI/DES, RI 90% s/p PCI/DES, EF 35%;  b. 04/2015 Relook Cath: LM nl, LAD 40p, RI patent stent, LCX patent stent, RCA nl, EF 55-65%.  . Chronic systolic CHF (congestive heart failure) ([place])     a. echo 03/2015: EF 30-35%, mild concentric LVH, severe HK of inf, inflat, & lat walls, mod MR, mild TR;  b. 04/2015 LV Gram: EF 55-65%.  . Ischemic cardiomyopathy     a. 03/2015 EF 30-35% post NSTEMI;  b. 04/2015 EF 55-65% on LV gram.  . DM2 (diabetes mellitus, type 2) ([place])      a. since 2002  . Tobacco abuse     a. quit 03/2015.  . Obesity   . Hyperlipidemia   . Hypotension     a. resulting in discontinuation of losartan and reduction in coreg dose.  Marland Kitchen Heavy menses     a. H/O IUD - expired in 2014 - remains in place.  . Iron deficiency anemia   . Kidney disease   . Uterine fibroid      Past Surgical History  Procedure Laterality Date  . Mouth surgery    . Cardiac catheterization  4/16    x2 stent [facility]  . Cardiac catheterization N/A 05/05/2015    Procedure: Left Heart Cath and Coronary Angiography;  Surgeon: Peter M Martinique, MD;  Location: [facility];  Service: Cardiovascular;  Laterality: N/A;     Family History  Problem Relation Age of Onset  . Diabetes Father   . Cancer Maternal Grandmother     lung  . Cancer Maternal Grandfather     prostate  . Diabetes Paternal Grandfather      Social History   Social History  . Marital Status: Single    Spouse Name: N/A  . Number of Children: N/A  . Years of Education: N/A   Occupational History  . Not on file.   Social History Main Topics  . Smoking status: Former Smoker -- 15 years    Quit date: 03/10/2015  . Smokeless tobacco: Never Used  . Alcohol Use: No  . Drug Use: No  . Sexual Activity: Yes    Birth Control/ Protection: Pill   Other Topics Concern  . Not on file   Social History Narrative   Lives locally with daughter and fiance.       PHYSICAL EXAM   BP 138/88 mmHg  Pulse 64  Ht 5\' 4"  (1.626 m)  Wt 183 lb 8 oz (83.235 kg)  BMI 31.48 kg/m2  Constitutional: She is oriented to person, place, and time. She appears well-developed and well-nourished. No distress.  HENT: No nasal discharge.  Head: Normocephalic and atraumatic.  Eyes: Pupils are equal and round. No discharge.  Neck: Normal range of motion. Neck supple. No JVD present. No thyromegaly present.  Cardiovascular: Normal rate, regular rhythm, normal heart sounds. Exam reveals no gallop and no friction  rub. No murmur heard.  Pulmonary/Chest: Effort normal and breath sounds normal. No stridor. No respiratory distress. She has no wheezes. She has no rales. She exhibits no tenderness.  Abdominal: Soft. Bowel sounds are normal. She exhibits no distension. There is no tenderness. There is no rebound and no guarding.  Musculoskeletal: Normal range of motion. She exhibits no edema and no tenderness.  Neurological: She is alert and oriented to person, place, and time. Coordination normal.  Skin: Skin is warm and dry. No rash noted. She is not diaphoretic. No erythema. No pallor.  Psychiatric: She has a normal mood and affect. Her behavior is normal. Judgment and  thought content normal.    EKG: Sinus  Tachycardia  Low voltage in limb leads.   ABNORMAL     ASSESSMENT AND PLAN

## 2015-10-23 NOTE — Patient Instructions (Signed)
Medication Instructions:  Your physician has recommended you make the following change in your medication:  START taking coreg 3.125mg  twice per day   Labwork: CBC  Testing/Procedures: none  Follow-Up: Your physician recommends that you schedule a follow-up appointment in: 3 months with Dr. Fletcher Anon.    Any Other Special Instructions Will Be Listed Below (If Applicable).     If you need a refill on your cardiac medications before your next appointment, please call your pharmacy.

## 2015-10-23 NOTE — Assessment & Plan Note (Signed)
Lab Results  Component Value Date   CHOL 93* 05/16/2015   HDL 32* 05/16/2015   LDLCALC 46 05/16/2015   TRIG 73 05/16/2015   CHOLHDL 2.9 05/16/2015   Continue treatment with atorvastatin.

## 2015-10-24 LAB — CBC
Hematocrit: 32 % — ABNORMAL LOW (ref 34.0–46.6)
Hemoglobin: 9.8 g/dL — ABNORMAL LOW (ref 11.1–15.9)
MCH: 24.1 pg — ABNORMAL LOW (ref 26.6–33.0)
MCHC: 30.6 g/dL — ABNORMAL LOW (ref 31.5–35.7)
MCV: 79 fL (ref 79–97)
Platelets: 428 10*3/uL — ABNORMAL HIGH (ref 150–379)
RBC: 4.07 x10E6/uL (ref 3.77–5.28)
RDW: 20.2 % — ABNORMAL HIGH (ref 12.3–15.4)
WBC: 6.8 10*3/uL (ref 3.4–10.8)

## 2015-11-07 ENCOUNTER — Encounter: Payer: Self-pay | Admitting: *Deleted

## 2015-11-09 ENCOUNTER — Encounter: Payer: Self-pay | Admitting: *Deleted

## 2015-11-09 DIAGNOSIS — I252 Old myocardial infarction: Secondary | ICD-10-CM

## 2015-11-09 NOTE — Progress Notes (Signed)
Cardiac Individual Treatment Plan  Patient Details  Name: Marg Macmaster MRN: [id number] Date of Birth: 1981-06-27 Referring Provider:  No ref. provider found  Initial Encounter Date:  07/24/2015  Visit Diagnosis: Status post non-ST elevation myocardial infarction (NSTEMI)  Patient's Home Medications on Admission:  Current outpatient prescriptions:  .  aspirin EC 81 MG tablet, Take 81 mg by mouth daily., Disp: , Rfl:  .  atorvastatin (LIPITOR) 40 MG tablet, Take 1 tablet (40 mg total) by mouth daily at 6 PM., Disp: 30 tablet, Rfl: 0 .  carvedilol (COREG) 3.125 MG tablet, Take 1 tablet (3.125 mg total) by mouth 2 (two) times daily., Disp: 60 tablet, Rfl: 3 .  ferrous sulfate 325 (65 FE) MG tablet, Take 1 tablet (325 mg total) by mouth 3 (three) times daily with meals., Disp: 90 tablet, Rfl: 3 .  glimepiride (AMARYL) 4 MG tablet, Take 1 tablet (4 mg total) by mouth daily., Disp: 90 tablet, Rfl: 1 .  Loperamide HCl (ANTI-DIARRHEAL PO), Take by mouth as needed., Disp: , Rfl:  .  losartan (COZAAR) 50 MG tablet, Take 1 tablet (50 mg total) by mouth daily., Disp: 30 tablet, Rfl: 3 .  mirtazapine (REMERON) 15 MG tablet, Take 15 mg by mouth at bedtime., Disp: , Rfl:  .  nitroGLYCERIN (NITROSTAT) 0.4 MG SL tablet, Place 0.4 mg under the tongue every 5 (five) minutes as needed for chest pain., Disp: , Rfl:  .  norethindrone-ethinyl estradiol 1/35 (ORTHO-NOVUM, NORTREL,CYCLAFEM) tablet, Patient will take 2 tablet daily x 5 days then daily, Disp: , Rfl:  .  pantoprazole (PROTONIX) 40 MG tablet, Take 1 tablet (40 mg total) by mouth daily at 6 (six) AM., Disp: 90 tablet, Rfl: 1 .  ticagrelor (BRILINTA) 90 MG TABS tablet, Take 1 tablet (90 mg total) by mouth 2 (two) times daily., Disp: 60 tablet, Rfl: 6  Past Medical History: Past Medical History  Diagnosis Date  . CAD (coronary artery disease)     a. NSTEMI 03/2015: mLCx 100% s/p PCI/DES, RI 90% s/p PCI/DES, EF 35%; b. 04/2015 Relook Cath: LM nl, LAD  40p, RI patent stent, LCX patent stent, RCA nl, EF 55-65%.  . Chronic systolic CHF (congestive heart failure) ([place])     a. echo 03/2015: EF 30-35%, mild concentric LVH, severe HK of inf, inflat, & lat walls, mod MR, mild TR;  b. 04/2015 LV Gram: EF 55-65%.  . Ischemic cardiomyopathy     a. 03/2015 EF 30-35% post NSTEMI;  b. 04/2015 EF 55-65% on LV gram.  . DM2 (diabetes mellitus, type 2) ([place])     a. since 2002  . Tobacco abuse     a. quit 03/2015.  . Obesity   . Hyperlipidemia   . Hypotension     a. resulting in discontinuation of losartan and reduction in coreg dose.  Marland Kitchen Heavy menses     a. H/O IUD - expired in 2014 - remains in place.  . Iron deficiency anemia   . Kidney disease   . Uterine fibroid     Tobacco Use: History  Smoking status  . Former Smoker -- 15 years  . Quit date: 03/10/2015  Smokeless tobacco  . Never Used    Labs: Recent Review Flowsheet Data    Labs for ITP Cardiac and Pulmonary Rehab Latest Ref Rng 03/31/2015 05/05/2015 05/16/2015   Cholestrol 100 - 199 mg/dL 133 - 93(L)   LDLCALC 0 - 99 mg/dL 92 - 46   HDL >39 mg/dL 26(L) - 32(L)  Trlycerides 0 - 149 mg/dL 77 - 73   Hemoglobin A1c 4.8 - 5.6 % 14.5(H) 14.2(H) -         POCT Glucose      07/24/15 1406           POCT Blood Glucose   Pre-Exercise 158 mg/dL          Exercise Target Goals:    Exercise Program Goal: Individual exercise prescription set with THRR, safety & activity barriers. Participant demonstrates ability to understand and report RPE using BORG scale, to self-measure pulse accurately, and to acknowledge the importance of the exercise prescription.  Exercise Prescription Goal: Starting with aerobic activity 30 plus minutes a day, 3 days per week for initial exercise prescription. Provide home exercise prescription and guidelines that participant acknowledges understanding prior to discharge.  Activity Barriers & Risk Stratification:     Activity Barriers & Risk Stratification -  07/24/15 1933    Activity Barriers & Risk Stratification   Activity Barriers None   Risk Stratification High      6 Minute Walk:     6 Minute Walk      07/24/15 1546       6 Minute Walk   Phase Initial     Distance 326 feet  NS test: 326 steps     Walk Time 6 minutes     Resting HR 116 bpm     Resting BP 100/70 mmHg     Max Ex. HR 111 bpm     Max Ex. BP 110/60 mmHg     RPE 7     Symptoms No        Initial Exercise Prescription:     Initial Exercise Prescription - 07/24/15 1500    Date of Initial Exercise Prescription   Date 07/24/15   Bike   Level 1   Minutes 15   Recumbant Bike   Level 3   RPM 40   Watts 30   Minutes 15   NuStep   Level 3   Watts 40   Minutes 15   Arm Ergometer   Level 1   Watts 10   Minutes 15   Arm/Foot Ergometer   Level 2   Watts 15   Minutes 15   Cybex   Level 2   RPM 40   Minutes 15   Recumbant Elliptical   Level 1   RPM 30   Watts 20   Minutes 15   REL-XR   Level 3   Watts 30   Minutes 15   Prescription Details   Frequency (times per week) 3   Duration Progress to 30 minutes of continuous aerobic without signs/symptoms of physical distress   Intensity   THRR REST +  20   Ratings of Perceived Exertion 11-15   Progression Continue progressive overload as per policy without signs/symptoms or physical distress.   Resistance Training   Training Prescription Yes   Weight 2   Reps 10-12      Exercise Prescription Changes:     Exercise Prescription Changes      08/02/15 1700 08/21/15 0600 09/12/15 1500 10/09/15 1100 11/07/15 0800   Exercise Review   Progression Yes Yes No  Absent since last review; last visit 08/02/15 No  Absent since last review; last visit 08/02/15 No  Absent since last review; last visit 08/02/15   Response to Exercise   Blood Pressure (Admit)  112/62 mmHg      Blood Pressure (Exercise)  124/60 mmHg      Blood Pressure (Exit)  104/68 mmHg      Heart Rate (Admit)  69 bpm      Heart Rate  (Exercise)  121 bpm      Heart Rate (Exit)  108 bpm      Rating of Perceived Exertion (Exercise)  13      Symptoms Some nausea        Duration Progress to 30 minutes of continuous aerobic without signs/symptoms of physical distress Progress to 30 minutes of continuous aerobic without signs/symptoms of physical distress Progress to 30 minutes of continuous aerobic without signs/symptoms of physical distress Progress to 30 minutes of continuous aerobic without signs/symptoms of physical distress Progress to 30 minutes of continuous aerobic without signs/symptoms of physical distress   Intensity Rest + 30 Rest + 30 Rest + 30 Rest + 30 Rest + 30   Progression Continue progressive overload as per policy without signs/symptoms or physical distress. Continue progressive overload as per policy without signs/symptoms or physical distress. Continue progressive overload as per policy without signs/symptoms or physical distress. Continue progressive overload as per policy without signs/symptoms or physical distress. Continue progressive overload as per policy without signs/symptoms or physical distress.   Resistance Training   Training Prescription _0    Weight _1 Reps 10-15 10-15 10-15 10-15 10-15   Interval Training   Interval Training _2    NuStep   Level  _3 Watts  62 40 40 40   Minutes  _4 Recumbant Elliptical   Level _5 RPM 40 40 40 40 40   Minutes _6 REL-XR   Level  _7 Watts  55 55 55 71   Minutes  _8 Discharge Exercise Prescription (Final Exercise Prescription Changes):     Exercise Prescription Changes - 11/07/15 0800    Exercise Review   Progression No  Absent since last review; last visit 08/02/15   Response to Exercise   Duration Progress to 30 minutes of continuous aerobic without signs/symptoms of physical distress   Intensity Rest + 30   Progression Continue progressive overload  as per policy without signs/symptoms or physical distress.   Resistance Training   Training Prescription Yes   Weight 2   Reps 10-15   Interval Training   Interval Training No   NuStep   Level 3   Watts 40   Minutes 15   Recumbant Elliptical   Level 3   RPM 40   Minutes 15   REL-XR   Level 3   Watts 55   Minutes 15      Nutrition:  Target Goals: Understanding of nutrition guidelines, daily intake of sodium <1526m, cholesterol <2069m calories 30% from fat and 7% or less from saturated fats, daily to have 5 or more servings of fruits and vegetables.  Biometrics:     Pre Biometrics - 07/24/15 1554    Pre Biometrics   Height 5' 5.8" (1.671 m)   Weight 169 lb 1.6 oz (76.703 kg)   Waist Circumference 34.5 inches   Hip Circumference 40 inches   Waist to Hip Ratio 0.86 %   BMI (Calculated) 27.5       Nutrition Therapy Plan and Nutrition Goals:  Nutrition Therapy & Goals - 07/24/15 1939    Nutrition Therapy   Drug/Food Interactions Statins/Certain Fruits   Intervention Plan   Intervention Using nutrition plan and personal goals to gain a healthy nutrition lifestyle. Add exercise as prescribed.      Nutrition Discharge: Rate Your Plate Scores:   Nutrition Goals Re-Evaluation:     Nutrition Goals Re-Evaluation      09/13/15 1420 11/09/15 1721         Personal Goal #1 Re-Evaluation   Personal Goal #1  Has not attended Cardiac Rehab much-multiple vm left.       Goal Progress Seen No No      Comments I left vm to see how she was doing and if she was going to come back to Cardiac Rehab. I asked her to let us know either way since we haven't seen her in a while.           Psychosocial: Target Goals: Acknowledge presence or absence of depression, maximize coping skills, provide positive support system. Participant is able to verbalize types and ability to use techniques and skills needed for reducing stress and depression.  Initial Review & Psychosocial  Screening:     Initial Psych Review & Screening - 07/24/15 1941    Initial Review   Current issues with Current Anxiety/Panic;Current Stress Concerns   Family Dynamics   Good Support System? Yes   Barriers   Psychosocial barriers to participate in program There are no identifiable barriers or psychosocial needs.;The patient should benefit from training in stress management and relaxation.   Screening Interventions   Interventions Encouraged to exercise;Program counselor consult      Quality of Life Scores:     Quality of Life - 07/24/15 2014    Quality of Life Scores   Health/Function Pre 12.9 %   Socioeconomic Pre 13.36 %   Psych/Spiritual Pre 12.64 %   Family Pre 15.5 %   GLOBAL Pre 13.32 %      PHQ-9:     Recent Review Flowsheet Data    Depression screen [facility] 2/9 07/24/2015   Decreased Interest 2   Down, Depressed, Hopeless 2   PHQ - 2 Score 4   Altered sleeping 3   Tired, decreased energy 1   Change in appetite 1   Feeling bad or failure about yourself  2   Trouble concentrating 0   Moving slowly or fidgety/restless 1   Suicidal thoughts 1   PHQ-9 Score 13   Difficult doing work/chores Somewhat difficult      Psychosocial Evaluation and Intervention:     Psychosocial Evaluation - 07/26/15 1706    Psychosocial Evaluation & Interventions   Interventions Stress management education;Relaxation education;Encouraged to exercise with the program and follow exercise prescription;Physician referral   Comments Counselor met with Ms. Bonaventura today for initial psychosocial evaluation.  She is a 34 year old who reports having a heart attack this past April and is also a type 2 Diabetic.  Ms. B has a strong support system living with her sister, a very helpful 44 year old daughter and a fiance who lives close by.  Ms. B states she is struggling with her medications currently that are causing a great deal of GI issues as well as sweating, dizziness, low blood sugars, blurred  vision and she is also having difficulty sleeping at night - stating possibly 5 hours/night on a good night - falling asleep around 7AM and sleeping until mid-day.  Ms. B also  reports a history of anxiety and panic symptoms for the past 6 years -never fully diagnosed or treated.  She admits to some irritability symptoms as well with some depressive symptoms currently.  Therapist provided Ms. B with information to see a psychiatrist for a medication evaluation for these mental health symptoms.  She is encouraged to continue speaking with her current Drs about the medication side effects that are causing the other negative symptoms.  Ms. B has goals to get healthier and increase her stamina and strength while in this program.  Counselor will follow up with her concerning these recommendations.   Continued Psychosocial Services Needed Yes  Ms. B is recommended to see a psychiatrist for a medication evaluation due to a history of untreated panic atttacks and some current symptoms.  She will also benefit from the psychoeducational components of this program as well as consistent exercise.      Psychosocial Re-Evaluation:     Psychosocial Re-Evaluation      07/31/15 1706 09/13/15 1420 11/09/15 1726       Psychosocial Re-Evaluation   Interventions  Encouraged to attend Cardiac Rehabilitation for the exercise Encouraged to attend Cardiac Rehabilitation for the exercise     Comments Follow up with Ms. B today reporting she had an initial assessment with the psychiatric group but will not see them again until after she sees her Dr. for possible medications for depressive and anxiety symptoms.  Counselor assessed sleep with Ms. B continuing to report difficulty getting to sleep is ongoing.  Counselor suggested talking with Pharmacist or doctor about an OTC natural sleep aid to see if this might help in the meantime until the psychiatrist is able to collaborate with her PCP.  Counselor will continue to follow  with Ms. B. while in this program.   I left vm to see how she was doing and if she was going to come back to Cardiac Rehab. I asked her to let us know either way since we haven't seen her in a while.  Unknown -min attendance.         Vocational Rehabilitation: Provide vocational rehab assistance to qualifying candidates.   Vocational Rehab Evaluation & Intervention:     Vocational Rehab - 07/24/15 1934    Initial Vocational Rehab Evaluation & Intervention   Assessment shows need for Vocational Rehabilitation No      Education: Education Goals: Education classes will be provided on a weekly basis, covering required topics. Participant will state understanding/return demonstration of topics presented.  Learning Barriers/Preferences:     Learning Barriers/Preferences - 07/24/15 1933    Learning Barriers/Preferences   Learning Barriers Sight   Learning Preferences Written Material      Education Topics: General Nutrition Guidelines/Fats and Fiber: -Group instruction provided by verbal, written material, models and posters to present the general guidelines for heart healthy nutrition. Gives an explanation and review of dietary fats and fiber.   Controlling Sodium/Reading Food Labels: -Group verbal and written material supporting the discussion of sodium use in heart healthy nutrition. Review and explanation with models, verbal and written materials for utilization of the food label.          Cardiac Rehab from 08/02/2015 in [facility] Cardiac Rehab   Date  07/31/15   Educator  PI   Instruction Review Code  2- meets goals/outcomes      Exercise Physiology & Risk Factors: - Group verbal and written instruction with models to review the exercise physiology of the cardiovascular system and associated  critical values. Details cardiovascular disease risk factors and the goals associated with each risk factor.   Aerobic Exercise & Resistance Training: - Gives group verbal and written  discussion on the health impact of inactivity. On the components of aerobic and resistive training programs and the benefits of this training and how to safely progress through these programs.   Flexibility, Balance, General Exercise Guidelines: - Provides group verbal and written instruction on the benefits of flexibility and balance training programs. Provides general exercise guidelines with specific guidelines to those with heart or lung disease. Demonstration and skill practice provided.   Stress Management: - Provides group verbal and written instruction about the health risks of elevated stress, cause of high stress, and healthy ways to reduce stress.   Depression: - Provides group verbal and written instruction on the correlation between heart/lung disease and depressed mood, treatment options, and the stigmas associated with seeking treatment.      Cardiac Rehab from 08/02/2015 in [facility] Cardiac Rehab   Date  08/02/15   Educator  [facility]   Instruction Review Code  2- meets goals/outcomes      Anatomy & Physiology of the Heart: - Group verbal and written instruction and models provide basic cardiac anatomy and physiology, with the coronary electrical and arterial systems. Review of: AMI, Angina, Valve disease, Heart Failure, Cardiac Arrhythmia, Pacemakers, and the ICD.   Cardiac Procedures: - Group verbal and written instruction and models to describe the testing methods done to diagnose heart disease. Reviews the outcomes of the test results. Describes the treatment choices: Medical Management, Angioplasty, or Coronary Bypass Surgery.   Cardiac Medications: - Group verbal and written instruction to review commonly prescribed medications for heart disease. Reviews the medication, class of the drug, and side effects. Includes the steps to properly store meds and maintain the prescription regimen.   Go Sex-Intimacy & Heart Disease, Get SMART - Goal Setting: - Group verbal and written  instruction through game format to discuss heart disease and the return to sexual intimacy. Provides group verbal and written material to discuss and apply goal setting through the application of the S.M.A.R.T. Method.   Other Matters of the Heart: - Provides group verbal, written materials and models to describe Heart Failure, Angina, Valve Disease, and Diabetes in the realm of heart disease. Includes description of the disease process and treatment options available to the cardiac patient.   Exercise & Equipment Safety: - Individual verbal instruction and demonstration of equipment use and safety with use of the equipment.      Cardiac Rehab from 08/02/2015 in [facility] Cardiac Rehab   Date  07/24/15   Educator  C. Telly Broberg,RN   Instruction Review Code  1- partially meets, needs review/practice      Infection Prevention: - Provides verbal and written material to individual with discussion of infection control including proper hand washing and proper equipment cleaning during exercise session.      Cardiac Rehab from 08/02/2015 in [facility] Cardiac Rehab   Date  07/24/15   Educator  Jimmie Molly   Instruction Review Code  2- meets goals/outcomes      Falls Prevention: - Provides verbal and written material to individual with discussion of falls prevention and safety.      Cardiac Rehab from 08/02/2015 in [facility] Cardiac Rehab   Date  07/24/15   Educator  C. Divine Imber,RN   Instruction Review Code  2- meets goals/outcomes      Diabetes: - Individual verbal and written instruction to review signs/symptoms  of diabetes, desired ranges of glucose level fasting, after meals and with exercise. Advice that pre and post exercise glucose checks will be done for 3 sessions at entry of program.      Cardiac Rehab from 08/02/2015 in [facility] Cardiac Rehab   Date  07/24/15   Educator  C. Mario Voong,RN   Instruction Review Code  1- partially meets, needs review/practice       Knowledge Questionnaire Score:      Knowledge Questionnaire Score - 07/24/15 1933    Knowledge Questionnaire Score   Pre Score 24      Personal Goals and Risk Factors at Admission:     Personal Goals and Risk Factors at Admission - 07/24/15 1939    Personal Goals and Risk Factors on Admission    Weight Management Yes   Intervention Learn and follow the exercise and diet guidelines while in the program. Utilize the nutrition and education classes to help gain knowledge of the diet and exercise expectations in the program   Increase Aerobic Exercise and Physical Activity Yes   Intervention While in program, learn and follow the exercise prescription taught. Start at a low level workload and increase workload after able to maintain previous level for 30 minutes. Increase time before increasing intensity.   Take Less Medication Yes   Intervention Learn your risk factors and begin the lifestyle modifications for risk factor control during your time in the program.   Understand more about Heart/Pulmonary Disease. Yes   Intervention While in program utilize professionals for any questions, and attend the education sessions. Great websites to use are www.americanheart.org or www.lung.org for reliable information.   Diabetes Yes   Goal Blood glucose control identified by blood glucose values, HgbA1C. Participant verbalizes understanding of the signs/symptoms of hyper/hypo glycemia, proper foot care and importance of medication and nutrition plan for blood glucose control.   Intervention Provide nutrition & aerobic exercise along with prescribed medications to achieve blood glucose in normal ranges: Fasting 65-99 mg/dL   Hypertension Yes   Goal Participant will see blood pressure controlled within the values of 140/72m/Hg or within value directed by their physician.   Intervention Provide nutrition & aerobic exercise along with prescribed medications to achieve BP 140/90 or less.   Lipids Yes   Goal Cholesterol controlled with  medications as prescribed, with individualized exercise RX and with personalized nutrition plan. Value goals: LDL < 710m HDL > 4077mParticipant states understanding of desired cholesterol values and following prescriptions.   Intervention Provide nutrition & aerobic exercise along with prescribed medications to achieve LDL <66m73mDL >40mg19mStress Yes   Goal To meet with psychosocial counselor for stress and relaxation information and guidance. To state understanding of performing relaxation techniques and or identifying personal stressors.   Intervention Provide education on types of stress, identifiying stressors, and ways to cope with stress. Provide demonstration and active practice of relaxation techniques.      Personal Goals and Risk Factors Review:      Goals and Risk Factor Review      09/13/15 1419 11/09/15 1722         Increase Aerobic Exercise and Physical Activity   Goals Progress/Improvement seen  No No      Comments I left vm to see how she was doing and if she was going to come back to Cardiac Rehab. I asked her to let us [place] either way since we haven't seen her in a while.  Has not attended Cardiac  Rehab much-multiple vm left.       Diabetes   Progress seen towards goals  Yes      Hypertension   Progress seen toward goals  Yes      Abnormal Lipids   Progress seen towards goals  Unknown      Stress   Progress seen towards goals  Unknown      Comments  Has not been to [place].          Personal Goals Discharge (Final Personal Goals and Risk Factors Review):      Goals and Risk Factor Review - 11/09/15 1722    Increase Aerobic Exercise and Physical Activity   Goals Progress/Improvement seen  No   Comments Has not attended Cardiac Rehab much-multiple vm left.    Diabetes   Progress seen towards goals Yes   Hypertension   Progress seen toward goals Yes   Abnormal Lipids   Progress seen towards goals Unknown   Stress   Progress seen towards goals  Unknown   Comments Has not been to [place].       ITP Comments:   Comments: Unknown -min attendance.

## 2015-11-14 ENCOUNTER — Telehealth: Payer: Self-pay

## 2015-11-14 NOTE — Telephone Encounter (Signed)
Prior auth for Losartan 50 mg obtained through [facility] [employer]. Auth # [id number]. This was faxed to [place].

## 2015-11-16 ENCOUNTER — Encounter: Payer: Self-pay | Admitting: Unknown Physician Specialty

## 2015-11-27 ENCOUNTER — Telehealth: Payer: Self-pay | Admitting: *Deleted

## 2015-11-27 NOTE — Telephone Encounter (Signed)
Spoke with pharmacy [facility] and explained to her that pt was needing PA for Losartan and has been processed through [place] [facility]. She ran losartan through the system and mentioned that Rx went through. Pt will be notified for Pick up.

## 2015-11-27 NOTE — Telephone Encounter (Signed)
Pt called asking if we can refax the auth for Losatran 50 mg to [facility] on [address].  She went to go pick it up and they were making her pay 40 dollars. Pt would like [place] to look into this to make sure that is right or wrong.  Please call patient once we have done this so she can go and pick it up. Please also refer back to last phone note. It was done but pharmacy states they never received it.

## 2015-11-29 ENCOUNTER — Ambulatory Visit: Payer: Medicaid Other | Admitting: Unknown Physician Specialty

## 2015-12-15 ENCOUNTER — Encounter: Payer: Self-pay | Admitting: Unknown Physician Specialty

## 2015-12-15 ENCOUNTER — Ambulatory Visit (INDEPENDENT_AMBULATORY_CARE_PROVIDER_SITE_OTHER): Payer: Medicaid Other | Admitting: Unknown Physician Specialty

## 2015-12-15 VITALS — BP 108/73 | HR 106 | Temp 98.6°F | Ht 65.3 in | Wt 182.8 lb

## 2015-12-15 DIAGNOSIS — E785 Hyperlipidemia, unspecified: Secondary | ICD-10-CM

## 2015-12-15 DIAGNOSIS — Z23 Encounter for immunization: Secondary | ICD-10-CM | POA: Diagnosis not present

## 2015-12-15 DIAGNOSIS — R809 Proteinuria, unspecified: Secondary | ICD-10-CM

## 2015-12-15 DIAGNOSIS — E119 Type 2 diabetes mellitus without complications: Secondary | ICD-10-CM | POA: Diagnosis not present

## 2015-12-15 DIAGNOSIS — K589 Irritable bowel syndrome without diarrhea: Secondary | ICD-10-CM | POA: Diagnosis not present

## 2015-12-15 NOTE — Assessment & Plan Note (Signed)
Stable, continue present medications.   

## 2015-12-15 NOTE — Progress Notes (Signed)
BP 108/73 mmHg  Pulse 106  Temp(Src) 98.6 F (37 C)  Ht 5' 5.3" (1.659 m)  Wt 182 lb 12.8 oz (82.918 kg)  BMI 30.13 kg/m2  SpO2 100%  LMP 01/04/2015 (Exact Date)   Subjective:    Patient ID: [name] [name], female    DOB: [birth year], 35 y.o.   MRN: [id number]  HPI: [name] [name] is a 35 y.o. female  Chief Complaint  Patient presents with  . Diabetes  . Hyperlipidemia  . Medication Refill    pt states she needs a refill on Atorvastatin   . Diabetes:  Using medications without difficulties No hypoglycemic episodes No hyperglycemic episodes Feet problems: some numbness which hasn't changed Blood Sugars averaging:About the same at around 200 eye exam within last year  Hypertension:  Using medications without difficulty Average home BPs   Using medication without problems or lightheadedness No chest pain with exertion or shortness of breath No Edema Average home BPs: "low 100's"  Elevated Cholesterol: Using medications without problems: No Muscle aches Diet compliance: "more carbs" Exercise: walking daily  IBS Having diarrhea at night ever since starting all the hormonal treatment.   Heart burn, sour stomach and diarrhea     Relevant past medical, surgical, family and social history reviewed and updated as indicated. Interim medical history since our last visit reviewed. Allergies and medications reviewed and updated.  Review of Systems  Per HPI unless specifically indicated above     Objective:    BP 108/73 mmHg  Pulse 106  Temp(Src) 98.6 F (37 C)  Ht 5' 5.3" (1.659 m)  Wt 182 lb 12.8 oz (82.918 kg)  BMI 30.13 kg/m2  SpO2 100%  LMP 01/04/2015 (Exact Date)  Wt Readings from Last 3 Encounters:  12/15/15 182 lb 12.8 oz (82.918 kg)  10/23/15 183 lb 8 oz (83.235 kg)  08/30/15 175 lb 12.8 oz (79.742 kg)    Physical Exam  Constitutional: She is oriented to person, place, and time. She appears well-developed and well-nourished. No distress.   HENT:  Head: Normocephalic and atraumatic.  Eyes: Conjunctivae and lids are normal. Pupils are equal, round, and reactive to light. Right eye exhibits no discharge. Left eye exhibits no discharge. No scleral icterus.  Neck: Normal range of motion. Neck supple. No JVD present. Carotid bruit is not present. No thyromegaly present.  Cardiovascular: Normal rate, regular rhythm and normal heart sounds.  Exam reveals no gallop and no friction rub.   No murmur heard. Pulmonary/Chest: Effort normal and breath sounds normal. No respiratory distress. She has no wheezes. She has no rales.  Abdominal: Soft. Normal appearance and bowel sounds are normal. There is no splenomegaly or hepatomegaly. There is no tenderness. There is no rebound.  Musculoskeletal: Normal range of motion.  Lymphadenopathy:    She has no cervical adenopathy.  Neurological: She is alert and oriented to person, place, and time.  Skin: Skin is warm, dry and intact. No rash noted. No pallor.  Psychiatric: She has a normal mood and affect. Her speech is normal and behavior is normal. Judgment and thought content normal. Cognition and memory are normal.    Results for orders placed or performed in visit on 10/23/15  CBC  Result Value Ref Range   WBC 6.8 3.4 - 10.8 x10E3/uL   RBC 4.07 3.77 - 5.28 x10E6/uL   Hemoglobin 9.8 (L) 11.1 - 15.9 g/dL   Hematocrit 32.0 (L) 34.0 - 46.6 %   MCV 79 79 - 97 fL   MCH  24.1 (L) 26.6 - 33.0 pg   MCHC 30.6 (L) 31.5 - 35.7 g/dL   RDW 20.2 (H) 12.3 - 15.4 %   Platelets 428 (H) 150 - 379 x10E3/uL      Assessment & Plan:   Problem List Items Addressed This Visit      Unprioritized   Hyperlipidemia    Stable, continue present medications.        Proteinuria    On Losartan      IBS (irritable bowel syndrome)    Pt ed.  Food elimination.  Probably due to hormonal treatment.  Consider GI referral       Other Visit Diagnoses    Diabetes mellitus without complication ([place])    -  Primary     Relevant Orders    Pneumococcal polysaccharide vaccine 23-valent greater than or equal to 2yo subcutaneous/IM (Completed)        Follow up plan: Return in about 3 months (around 03/14/2016) for DM check-up.mm

## 2015-12-15 NOTE — Assessment & Plan Note (Signed)
On Losartan 

## 2015-12-15 NOTE — Patient Instructions (Signed)
Irritable Bowel Syndrome, Adult Irritable bowel syndrome (IBS) is not one specific disease. It is a group of symptoms that affects the organs responsible for digestion (gastrointestinal or GI tract).  To regulate how your GI tract works, your body sends signals back and forth between your intestines and your brain. If you have IBS, there may be a problem with these signals. As a result, your GI tract does not function normally. Your intestines may become more sensitive and overreact to certain things. This is especially true when you eat certain foods or when you are under stress.  There are four types of IBS. These may be determined based on the consistency of your stool:   IBS with diarrhea.   IBS with constipation.   Mixed IBS.   Unsubtyped IBS.  It is important to know which type of IBS you have. Some treatments are more likely to be helpful for certain types of IBS.  CAUSES  The exact cause of IBS is not known. RISK FACTORS You may have a higher risk of IBS if:  You are a woman.  You are younger than 36 years old.  You have a family history of IBS.  You have mental health problems.  You have had bacterial infection of your GI tract. SIGNS AND SYMPTOMS  Symptoms of IBS vary from person to person. The main symptom is abdominal pain or discomfort. Additional symptoms usually include one or more of the following:   Diarrhea, constipation, or both.   Abdominal swelling or bloating.   Feeling full or sick after eating a small or regular-size meal.   Frequent gas.   Mucus in the stool.   A feeling of having more stool left after a bowel movement.  Symptoms tend to come and go. They may be associated with stress, psychiatric conditions, or nothing at all.  DIAGNOSIS  There is no specific test to diagnose IBS. Your health care provider will make a diagnosis based on a physical exam, medical history, and your symptoms. You may have other tests to rule out other  conditions that may be causing your symptoms. These may include:   Blood tests.   X-rays.   CT scan.  Endoscopy and colonoscopy. This is a test in which your GI tract is viewed with a long, thin, flexible tube. TREATMENT There is no cure for IBS, but treatment can help relieve symptoms. IBS treatment often includes:   Changes to your diet, such as:  Eating more fiber.  Avoiding foods that cause symptoms.  Drinking more water.  Eating regular, medium-sized portioned meals.  Medicines. These may include:  Fiber supplements if you have constipation.  Medicine to control diarrhea (antidiarrheal medicines).  Medicine to help control muscle spasms in your GI tract (antispasmodic medicines).  Medicines to help with any mental health issues, such as antidepressants or tranquilizers.  Therapy.  Talk therapy may help with anxiety, depression, or other mental health issues that can make IBS symptoms worse.  Stress reduction.  Managing your stress can help keep symptoms under control. HOME CARE INSTRUCTIONS   Take medicines only as directed by your health care provider.  Eat a healthy diet.  Avoid foods and drinks with added sugar.  Include more whole grains, fruits, and vegetables gradually into your diet. This may be especially helpful if you have IBS with constipation.  Avoid any foods and drinks that make your symptoms worse. These may include dairy products and caffeinated or carbonated drinks.  Do not eat large meals.    Drink enough fluid to keep your urine clear or pale yellow.  Exercise regularly. Ask your health care provider for recommendations of good activities for you.  Keep all follow-up visits as directed by your health care provider. This is important. SEEK MEDICAL CARE IF:   You have constant pain.  You have trouble or pain with swallowing.  You have worsening diarrhea. SEEK IMMEDIATE MEDICAL CARE IF:   You have severe and worsening abdominal  pain.   You have diarrhea and:   You have a rash, stiff neck, or severe headache.   You are irritable, sleepy, or difficult to awaken.   You are weak, dizzy, or extremely thirsty.   You have bright red blood in your stool or you have black tarry stools.   You have unusual abdominal swelling that is painful.   You vomit continuously.   You vomit blood (hematemesis).   You have both abdominal pain and a fever.    This information is not intended to replace advice given to you by your health care provider. Make sure you discuss any questions you have with your health care provider.   Document Released: 11/25/2005 Document Revised: 12/16/2014 Document Reviewed: 08/12/2014 Elsevier Interactive Patient Education 2016 [place]. Diet for Irritable Bowel Syndrome When you have irritable bowel syndrome (IBS), the foods you eat and your eating habits are very important. IBS may cause various symptoms, such as abdominal pain, constipation, or diarrhea. Choosing the right foods can help ease discomfort caused by these symptoms. Work with your health care provider and dietitian to find the best eating plan to help control your symptoms. WHAT GENERAL GUIDELINES DO I NEED TO FOLLOW?  Keep a food diary. This will help you identify foods that cause symptoms. Write down:  What you eat and when.  What symptoms you have.  When symptoms occur in relation to your meals.  Avoid foods that cause symptoms. Talk with your dietitian about other ways to get the same nutrients that are in these foods.  Eat more foods that contain fiber. Take a fiber supplement if directed by your dietitian.  Eat your meals slowly, in a relaxed setting.  Aim to eat 5-6 small meals per day. Do not skip meals.  Drink enough fluids to keep your urine clear or pale yellow.  Ask your health care provider if you should take an over-the-counter probiotic during flare-ups to help restore healthy gut  bacteria.  If you have cramping or diarrhea, try making your meals low in fat and high in carbohydrates. Examples of carbohydrates are pasta, rice, whole grain breads and cereals, fruits, and vegetables.  If dairy products cause your symptoms to flare up, try eating less of them. You might be able to handle yogurt better than other dairy products because it contains bacteria that help with digestion. WHAT FOODS ARE NOT RECOMMENDED? The following are some foods and drinks that may worsen your symptoms:  Fatty foods, such as [place] fries.  Milk products, such as cheese or ice cream.  Chocolate.  Alcohol.  Products with caffeine, such as coffee.  Carbonated drinks, such as soda. The items listed above may not be a complete list of foods and beverages to avoid. Contact your dietitian for more information. WHAT FOODS ARE GOOD SOURCES OF FIBER? Your health care provider or dietitian may recommend that you eat more foods that contain fiber. Fiber can help reduce constipation and other IBS symptoms. Add foods with fiber to your diet a little at a time  so that your body can get used to them. Too much fiber at once might cause gas and swelling of your abdomen. The following are some foods that are good sources of fiber:  Apples.  Peaches.  Pears.  Berries.  Figs.  Broccoli (raw).  Cabbage.  Carrots.  Raw peas.  Kidney beans.  Lima beans.  Whole grain bread.  Whole grain cereal. FOR MORE INFORMATION  International Foundation for Functional Gastrointestinal Disorders: www.iffgd.[employer] of Diabetes and Digestive and Kidney Diseases: NetworkAffair.co.za.aspx   This information is not intended to replace advice given to you by your health care provider. Make sure you discuss any questions you have with your health care provider.   Document Released: 02/15/2004 Document Revised: 12/16/2014 Document  Reviewed: 02/25/2014 Elsevier Interactive Patient Education [employer].

## 2015-12-15 NOTE — Assessment & Plan Note (Addendum)
Pt ed.  Food elimination.  Probably due to hormonal treatment.  Consider GI referral

## 2016-01-22 ENCOUNTER — Ambulatory Visit (INDEPENDENT_AMBULATORY_CARE_PROVIDER_SITE_OTHER): Payer: Medicaid Other | Admitting: Cardiovascular Disease

## 2016-01-22 ENCOUNTER — Encounter: Payer: Self-pay | Admitting: Cardiovascular Disease

## 2016-01-22 VITALS — BP 138/78 | HR 64 | Ht 64.0 in | Wt 199.5 lb

## 2016-01-22 DIAGNOSIS — I2510 Atherosclerotic heart disease of native coronary artery without angina pectoris: Secondary | ICD-10-CM | POA: Diagnosis not present

## 2016-01-22 DIAGNOSIS — I5022 Chronic systolic (congestive) heart failure: Secondary | ICD-10-CM | POA: Diagnosis not present

## 2016-01-22 DIAGNOSIS — E785 Hyperlipidemia, unspecified: Secondary | ICD-10-CM

## 2016-01-22 MED ORDER — ATORVASTATIN CALCIUM 20 MG PO TABS: 20.0000 mg | ORAL_TABLET | Freq: Every day | ORAL | Status: DC

## 2016-01-22 NOTE — Assessment & Plan Note (Signed)
She is doing very well with no symptoms suggestive of angina. Continue medical therapy. Continue dual antiplatelet therapy for one to 2 years.

## 2016-01-22 NOTE — Assessment & Plan Note (Signed)
Initial ejection fraction was 30-35%. Most recent ejection fraction was 50%. Continue treatment with carvedilol and losartan.

## 2016-01-22 NOTE — Assessment & Plan Note (Signed)
Lab Results  Component Value Date   CHOL 93* 05/16/2015   HDL 32* 05/16/2015   LDLCALC 46 05/16/2015   TRIG 73 05/16/2015   CHOLHDL 2.9 05/16/2015   She reports some mild myalgia. I resumed atorvastatin at 20 mg daily.

## 2016-01-22 NOTE — Progress Notes (Signed)
HPI  This is a 35 year old African American female who is here today for a follow-up visit regarding coronary artery disease and chronic systolic heart failure.  She has prolonged history of type 2 diabetes since 2002 , prolonged history of tobacco use, and obesity.  She presented to [facility] in April of 2015 with NSTEMI. Echocardiogram showed an ejection fraction of 30% with severe inferior, inferolateral and lateral wall hypokinesis with moderate mitral regurgitation. Cardiac catheterization showed occluded mid left circumflex with significant disease affecting the proximal OM1. There was mild disease affecting the LAD and minor irregularities in the RCA. Ejection fraction was 35%. I performed successful angioplasty and drug-eluting stent placement to the mid LCX and proximal OM . She underwent a repeat cardiac catheterization in May 2016 which showed patent stents in the left circumflex and OM1. Ejection fraction was 50-55%.  She was hospitalized in September with symptomatic anemia due to heavy periods. This required transfusion. She was diagnosed with uterine fibroid and was placed on birth control pills with significant improvement in bleeding. She has been doing very well and denies any chest pain or shortness of breath. She has been taking all her medications but she ran out of atorvastatin few weeks ago.  Allergies  Allergen Reactions  . Lisinopril Itching and Swelling  . Shellfish Allergy Itching and Swelling     Current Outpatient Prescriptions on File Prior to Visit  Medication Sig Dispense Refill  . aspirin EC 81 MG tablet Take 81 mg by mouth daily.    . carvedilol (COREG) 3.125 MG tablet Take 1 tablet (3.125 mg total) by mouth 2 (two) times daily. 60 tablet 3  . ferrous sulfate 325 (65 FE) MG tablet Take 1 tablet (325 mg total) by mouth 3 (three) times daily with meals. 90 tablet 3  . glimepiride (AMARYL) 4 MG tablet Take 1 tablet (4 mg total) by mouth daily. 90 tablet 1  .  Loperamide HCl (ANTI-DIARRHEAL PO) Take by mouth as needed.    Marland Kitchen losartan (COZAAR) 50 MG tablet Take 1 tablet (50 mg total) by mouth daily. 30 tablet 3  . nitroGLYCERIN (NITROSTAT) 0.4 MG SL tablet Place 0.4 mg under the tongue every 5 (five) minutes as needed for chest pain.    Marland Kitchen norethindrone-ethinyl estradiol 1/35 (ORTHO-NOVUM, NORTREL,CYCLAFEM) tablet Patient will take 2 tablet daily x 5 days then daily    . pantoprazole (PROTONIX) 40 MG tablet Take 1 tablet (40 mg total) by mouth daily at 6 (six) AM. 90 tablet 1  . ticagrelor (BRILINTA) 90 MG TABS tablet Take 1 tablet (90 mg total) by mouth 2 (two) times daily. 60 tablet 6  . atorvastatin (LIPITOR) 40 MG tablet Take 1 tablet (40 mg total) by mouth daily at 6 PM. (Patient not taking: Reported on 01/22/2016) 30 tablet 0   No current facility-administered medications on file prior to visit.     Past Medical History  Diagnosis Date  . CAD (coronary artery disease)     a. NSTEMI 03/2015: mLCx 100% s/p PCI/DES, RI 90% s/p PCI/DES, EF 35%; b. 04/2015 Relook Cath: LM nl, LAD 40p, RI patent stent, LCX patent stent, RCA nl, EF 55-65%.  . Chronic systolic CHF (congestive heart failure) ([place])     a. echo 03/2015: EF 30-35%, mild concentric LVH, severe HK of inf, inflat, & lat walls, mod MR, mild TR;  b. 04/2015 LV Gram: EF 55-65%.  . Ischemic cardiomyopathy     a. 03/2015 EF 30-35% post NSTEMI;  b. 04/2015  EF 55-65% on LV gram.  . DM2 (diabetes mellitus, type 2) ([place])     a. since 2002  . Tobacco abuse     a. quit 03/2015.  . Obesity   . Hyperlipidemia   . Hypotension     a. resulting in discontinuation of losartan and reduction in coreg dose.  Marland Kitchen Heavy menses     a. H/O IUD - expired in 2014 - remains in place.  . Iron deficiency anemia   . Kidney disease   . Uterine fibroid      Past Surgical History  Procedure Laterality Date  . Mouth surgery    . Cardiac catheterization  4/16    x2 stent [facility]  . Cardiac catheterization N/A 05/05/2015     Procedure: Left Heart Cath and Coronary Angiography;  Surgeon: Peter M Martinique, MD;  Location: [facility];  Service: Cardiovascular;  Laterality: N/A;     Family History  Problem Relation Age of Onset  . Diabetes Father   . Cancer Maternal Grandmother     lung  . Cancer Maternal Grandfather     prostate  . Diabetes Paternal Grandfather      Social History   Social History  . Marital Status: Single    Spouse Name: N/A  . Number of Children: N/A  . Years of Education: N/A   Occupational History  . Not on file.   Social History Main Topics  . Smoking status: Former Smoker -- 15 years    Quit date: 03/10/2015  . Smokeless tobacco: Never Used  . Alcohol Use: No  . Drug Use: No  . Sexual Activity: Yes    Birth Control/ Protection: Pill   Other Topics Concern  . Not on file   Social History Narrative   Lives locally with daughter and fiance.       PHYSICAL EXAM   BP 138/78 mmHg  Pulse 64  Ht 5\' 4"  (1.626 m)  Wt 199 lb 8 oz (90.493 kg)  BMI 34.23 kg/m2  Constitutional: She is oriented to person, place, and time. She appears well-developed and well-nourished. No distress.  HENT: No nasal discharge.  Head: Normocephalic and atraumatic.  Eyes: Pupils are equal and round. No discharge.  Neck: Normal range of motion. Neck supple. No JVD present. No thyromegaly present.  Cardiovascular: Normal rate, regular rhythm, normal heart sounds. Exam reveals no gallop and no friction rub. No murmur heard.  Pulmonary/Chest: Effort normal and breath sounds normal. No stridor. No respiratory distress. She has no wheezes. She has no rales. She exhibits no tenderness.  Abdominal: Soft. Bowel sounds are normal. She exhibits no distension. There is no tenderness. There is no rebound and no guarding.  Musculoskeletal: Normal range of motion. She exhibits no edema and no tenderness.  Neurological: She is alert and oriented to person, place, and time. Coordination normal.  Skin:  Skin is warm and dry. No rash noted. She is not diaphoretic. No erythema. No pallor.  Psychiatric: She has a normal mood and affect. Her behavior is normal. Judgment and thought content normal.      ASSESSMENT AND PLAN

## 2016-01-22 NOTE — Patient Instructions (Signed)
Medication Instructions: Resume Atorvastatin at 20 mg daily.  Continue other medications.   Labwork: None.   Procedures/Testing: None.   Follow-Up: 6 months with Dr. Fletcher Anon.   Any Additional Special Instructions Will Be Listed Below (If Applicable).     If you need a refill on your cardiac medications before your next appointment, please call your pharmacy.

## 2016-01-24 ENCOUNTER — Encounter: Payer: Self-pay | Admitting: Emergency Medicine

## 2016-01-24 ENCOUNTER — Emergency Department
Admission: EM | Admit: 2016-01-24 | Discharge: 2016-01-24 | Disposition: A | Discharge status: Home / Self Care | Payer: Medicaid Other | Attending: Emergency Medicine | Admitting: Emergency Medicine

## 2016-01-24 DIAGNOSIS — Y9389 Activity, other specified: Secondary | ICD-10-CM | POA: Diagnosis not present

## 2016-01-24 DIAGNOSIS — S199XXA Unspecified injury of neck, initial encounter: Secondary | ICD-10-CM | POA: Diagnosis present

## 2016-01-24 DIAGNOSIS — Z79899 Other long term (current) drug therapy: Secondary | ICD-10-CM | POA: Diagnosis not present

## 2016-01-24 DIAGNOSIS — I10 Essential (primary) hypertension: Secondary | ICD-10-CM | POA: Diagnosis not present

## 2016-01-24 DIAGNOSIS — S161XXA Strain of muscle, fascia and tendon at neck level, initial encounter: Secondary | ICD-10-CM | POA: Diagnosis not present

## 2016-01-24 DIAGNOSIS — Y92410 Unspecified street and highway as the place of occurrence of the external cause: Secondary | ICD-10-CM | POA: Diagnosis not present

## 2016-01-24 DIAGNOSIS — S238XXA Sprain of other specified parts of thorax, initial encounter: Secondary | ICD-10-CM | POA: Insufficient documentation

## 2016-01-24 DIAGNOSIS — S239XXA Sprain of unspecified parts of thorax, initial encounter: Secondary | ICD-10-CM

## 2016-01-24 DIAGNOSIS — Z87891 Personal history of nicotine dependence: Secondary | ICD-10-CM | POA: Insufficient documentation

## 2016-01-24 DIAGNOSIS — E119 Type 2 diabetes mellitus without complications: Secondary | ICD-10-CM | POA: Diagnosis not present

## 2016-01-24 DIAGNOSIS — Z7982 Long term (current) use of aspirin: Secondary | ICD-10-CM | POA: Diagnosis not present

## 2016-01-24 DIAGNOSIS — Z7984 Long term (current) use of oral hypoglycemic drugs: Secondary | ICD-10-CM | POA: Insufficient documentation

## 2016-01-24 DIAGNOSIS — Y998 Other external cause status: Secondary | ICD-10-CM | POA: Insufficient documentation

## 2016-01-24 MED ORDER — CYCLOBENZAPRINE HCL 5 MG PO TABS: 5.0000 mg | ORAL_TABLET | Freq: Three times a day (TID) | ORAL | Status: DC | PRN
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
## 2016-01-29 ENCOUNTER — Telehealth: Payer: Self-pay | Admitting: Cardiovascular Disease

## 2016-01-29 NOTE — Telephone Encounter (Signed)
Pt c/o BP issue: STAT if pt c/o blurred vision, one-sided weakness or slurred speech  1. What are your last 5 BP readings?  01/29/16 181/85  01/28/16 169/111 01/27/16 146/100 01/26/16  166/106 01/25/16  156/102   2. Are you having any other symptoms (ex. Dizziness, headache, blurred vision, passed out)?  Just headaches   3. What is your BP issue? BP is running high. Normal BP is usually around 120's/80's No changes in Medication.  Denies CP/SOB
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
## 2016-02-07 ENCOUNTER — Telehealth: Payer: Self-pay | Admitting: Cardiovascular Disease

## 2016-02-07 NOTE — Telephone Encounter (Signed)
Pt c/o BP issue: STAT if pt c/o blurred vision, one-sided weakness or slurred speech  1. What are your last 5 BP readings?  02/07/16 156/108 02/06/16 154/110 02/05/16 150/102 02/04/16 144/91 02/03/16 156/120  2. Are you having any other symptoms (ex. Dizziness, headache, blurred vision, passed out)? Just the headache   3. What is your BP issue? Still running a bit high

## 2016-02-08 NOTE — Telephone Encounter (Signed)
Left message on machine for patient to contact the office.   

## 2016-02-09 ENCOUNTER — Other Ambulatory Visit: Payer: Self-pay

## 2016-02-09 DIAGNOSIS — I159 Secondary hypertension, unspecified: Secondary | ICD-10-CM

## 2016-02-09 NOTE — Telephone Encounter (Signed)
Increase Coreg to 6.25 mg bid. Check BMP.
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
## 2016-02-09 NOTE — Telephone Encounter (Signed)
S/w pt of MD recommendations. Pt agreeable w/plan. Labs March 8

## 2016-02-13 ENCOUNTER — Emergency Department: Payer: Medicaid Other

## 2016-02-13 ENCOUNTER — Encounter: Payer: Self-pay | Admitting: Emergency Medicine

## 2016-02-13 ENCOUNTER — Emergency Department
Admission: EM | Admit: 2016-02-13 | Discharge: 2016-02-13 | Disposition: A | Discharge status: Home / Self Care | Payer: Medicaid Other | Attending: Emergency Medicine | Admitting: Emergency Medicine

## 2016-02-13 DIAGNOSIS — Z793 Long term (current) use of hormonal contraceptives: Secondary | ICD-10-CM | POA: Diagnosis not present

## 2016-02-13 DIAGNOSIS — Z7984 Long term (current) use of oral hypoglycemic drugs: Secondary | ICD-10-CM | POA: Diagnosis not present

## 2016-02-13 DIAGNOSIS — Z7982 Long term (current) use of aspirin: Secondary | ICD-10-CM | POA: Insufficient documentation

## 2016-02-13 DIAGNOSIS — H109 Unspecified conjunctivitis: Secondary | ICD-10-CM | POA: Diagnosis not present

## 2016-02-13 DIAGNOSIS — B349 Viral infection, unspecified: Secondary | ICD-10-CM | POA: Insufficient documentation

## 2016-02-13 DIAGNOSIS — R0602 Shortness of breath: Secondary | ICD-10-CM | POA: Diagnosis present

## 2016-02-13 DIAGNOSIS — E119 Type 2 diabetes mellitus without complications: Secondary | ICD-10-CM | POA: Diagnosis not present

## 2016-02-13 DIAGNOSIS — Z87891 Personal history of nicotine dependence: Secondary | ICD-10-CM | POA: Diagnosis not present

## 2016-02-13 DIAGNOSIS — Z79899 Other long term (current) drug therapy: Secondary | ICD-10-CM | POA: Insufficient documentation

## 2016-02-13 DIAGNOSIS — I10 Essential (primary) hypertension: Secondary | ICD-10-CM | POA: Diagnosis not present

## 2016-02-13 HISTORY — DX: Essential (primary) hypertension: I10

## 2016-02-13 LAB — COMPREHENSIVE METABOLIC PANEL
ALT: 21 U/L (ref 14–54)
AST: 21 U/L (ref 15–41)
Albumin: 2.8 g/dL — ABNORMAL LOW (ref 3.5–5.0)
Alkaline Phosphatase: 101 U/L (ref 38–126)
Anion gap: 8 (ref 5–15)
BUN: 22 mg/dL — ABNORMAL HIGH (ref 6–20)
CO2: 25 mmol/L (ref 22–32)
Calcium: 8.6 mg/dL — ABNORMAL LOW (ref 8.9–10.3)
Chloride: 106 mmol/L (ref 101–111)
Creatinine, Ser: 1.07 mg/dL — ABNORMAL HIGH (ref 0.44–1.00)
GFR calc Af Amer: >60 mL/min (ref >60)
GFR calc non Af Amer: >60 mL/min (ref >60)
Glucose, Bld: 258 mg/dL — ABNORMAL HIGH (ref 65–99)
Potassium: 3.8 mmol/L (ref 3.5–5.1)
Sodium: 139 mmol/L (ref 135–145)
Total Bilirubin: 0.3 mg/dL (ref 0.3–1.2)
Total Protein: 6.6 g/dL (ref 6.5–8.1)

## 2016-02-13 LAB — CBC WITH DIFFERENTIAL/PLATELET
Basophils Absolute: 0.1 10*3/uL (ref 0–0.1)
Basophils Relative: 1 %
Eosinophils Absolute: 0.5 10*3/uL (ref 0–0.7)
Eosinophils Relative: 7 %
HCT: 34.3 % — ABNORMAL LOW (ref 35.0–47.0)
Hemoglobin: 11.5 g/dL — ABNORMAL LOW (ref 12.0–16.0)
Lymphocytes Relative: 28 %
Lymphs Abs: 2.2 10*3/uL (ref 1.0–3.6)
MCH: 26 pg (ref 26.0–34.0)
MCHC: 33.4 g/dL (ref 32.0–36.0)
MCV: 77.9 fL — ABNORMAL LOW (ref 80.0–100.0)
Monocytes Absolute: 0.3 10*3/uL (ref 0.2–0.9)
Monocytes Relative: 4 %
Neutro Abs: 4.6 10*3/uL (ref 1.4–6.5)
Neutrophils Relative %: 60 %
Platelets: 343 10*3/uL (ref 150–440)
RBC: 4.41 MIL/uL (ref 3.80–5.20)
RDW: 15.3 % — ABNORMAL HIGH (ref 11.5–14.5)
WBC: 7.8 10*3/uL (ref 3.6–11.0)

## 2016-02-13 LAB — TROPONIN I
Troponin I: <0.03 ng/mL (ref <0.031)
Troponin I: <0.03 ng/mL (ref <0.031)

## 2016-02-13 LAB — BRAIN NATRIURETIC PEPTIDE: B Natriuretic Peptide: 127 pg/mL — ABNORMAL HIGH (ref 0.0–100.0)

## 2016-02-13 LAB — POCT RAPID STREP A: Streptococcus, Group A Screen (Direct): NEGATIVE

## 2016-02-13 MED ORDER — TOBRAMYCIN 0.3 % OP SOLN
2.0000 [drp] | Freq: Once | OPHTHALMIC | Status: AC
  Administered 2016-02-13: 2 [drp] via OPHTHALMIC
  Filled 2016-02-13: qty 5

## 2016-02-13 MED ORDER — MAGIC MOUTHWASH
10.0000 mL | Freq: Once | ORAL | Status: AC
  Administered 2016-02-13: 10 mL via ORAL
  Filled 2016-02-13: qty 10

## 2016-02-13 MED ORDER — TOBRAMYCIN 0.3 % OP SOLN: 2.0000 [drp] | OPHTHALMIC | Status: AC

## 2016-02-13 MED ORDER — MAGIC MOUTHWASH: 5.0000 mL | Freq: Three times a day (TID) | ORAL | Status: DC | PRN

## 2016-02-13 NOTE — ED Notes (Addendum)
Pt reports short of breath 30 minutes ago (no longer short of breath per pt) and light headedness (continues per pt) - Pt c/o nausea, sore throat, right earache, sweats and chills with occasional headache (sick for one week)

## 2016-02-13 NOTE — ED Notes (Addendum)
Pt presents to ED with c/o sudden onset of sob with diaphoresis and headache. Pt states the last time she felt this way she was having a heart attack (last April). Pt states she doesn't have any chest pain at the moment but does feel a little discomfort on the right side of her chest. Pt skin is cool and clammy. Slight increased work of breathing noted at this time. Pt states the past couple of days she has been coughing with nasal congestion and sore throat with swelling and sores noted to her right ear. Pt states she is feeling nauseated at this time; vomiting during triage.

## 2016-02-13 NOTE — ED Provider Notes (Signed)
[facility] Emergency Department Provider Note  ____________________________________________  Time seen: Approximately 1:34 AM  I have reviewed the triage vital signs and the nursing notes.   HISTORY  Chief Complaint Shortness of Breath and Dizziness    HPI Joselyne Kolis is a 35 y.o. female who presents to the ED from home with multiple medical complaints. Patient has a history of ischemic cardiomyopathy, CAD s/p stents, diabetes and CHF. Patient reports chills, sore throat, right earache, nausea, sweats, occasional headache, occasional cough for the past week. + Sick contacts; also sister with recent strep throat. Approximately 12:30 AM she was at rest in her bed when she felt hot, short of breath and lightheaded. Denies recent fever, chest pain, abdominal pain, vomiting. She has been having diarrhea intermittently this week. Also noted "sores" to right ear and inside her nostrils.Denies recent travel or trauma. Nothing makes her symptoms better or worse.   Past Medical History  Diagnosis Date  . CAD (coronary artery disease)     a. NSTEMI 03/2015: mLCx 100% s/p PCI/DES, RI 90% s/p PCI/DES, EF 35%; b. 04/2015 Relook Cath: LM nl, LAD 40p, RI patent stent, LCX patent stent, RCA nl, EF 55-65%.  . Chronic systolic CHF (congestive heart failure) ([place])     a. echo 03/2015: EF 30-35%, mild concentric LVH, severe HK of inf, inflat, & lat walls, mod MR, mild TR;  b. 04/2015 LV Gram: EF 55-65%.  . Ischemic cardiomyopathy     a. 03/2015 EF 30-35% post NSTEMI;  b. 04/2015 EF 55-65% on LV gram.  . DM2 (diabetes mellitus, type 2) ([place])     a. since 2002  . Tobacco abuse     a. quit 03/2015.  . Obesity   . Hyperlipidemia   . Hypotension     a. resulting in discontinuation of losartan and reduction in coreg dose.  Marland Kitchen Heavy menses     a. H/O IUD - expired in 2014 - remains in place.  . Iron deficiency anemia   . Kidney disease   . Uterine fibroid   . Hypertension   . MI  (myocardial infarction) ([place]) 03/29/2015    Patient Active Problem List   Diagnosis Date Noted  . IBS (irritable bowel syndrome) 12/15/2015  . Proteinuria 08/30/2015  . Symptomatic anemia   . Coronary artery disease involving native coronary artery of native heart with angina pectoris with documented spasm ([place])   . Angina pectoris ([place])   . Iron deficiency anemia   . Heavy menses   . SOB (shortness of breath) 08/21/2015  . Chest pain 05/05/2015  . CAD in native artery 05/05/2015  . Pain in the chest   . Hyperlipidemia 04/13/2015  . CAD (coronary artery disease)   . Chronic systolic CHF (congestive heart failure) ([place])   . Ischemic cardiomyopathy   . DM2 (diabetes mellitus, type 2) ([place])   . Tobacco abuse   . Obesity     Past Surgical History  Procedure Laterality Date  . Mouth surgery    . Cardiac catheterization  4/16    x2 stent [facility]  . Cardiac catheterization N/A 05/05/2015    Procedure: Left Heart Cath and Coronary Angiography;  Surgeon: Peter M Martinique, MD;  Location: [facility] [facility];  Service: Cardiovascular;  Laterality: N/A;    Current Outpatient Rx  Name  Route  Sig  Dispense  Refill  . aspirin EC 81 MG tablet   Oral   Take 81 mg by mouth daily.         Marland Kitchen  atorvastatin (LIPITOR) 20 MG tablet   Oral   Take 1 tablet (20 mg total) by mouth daily.   30 tablet   6   . carvedilol (COREG) 3.125 MG tablet   Oral   Take 1 tablet (3.125 mg total) by mouth 2 (two) times daily.   60 tablet   3     Do not fill now. Pt will call when ready   . ferrous sulfate 325 (65 FE) MG tablet   Oral   Take 1 tablet (325 mg total) by mouth 3 (three) times daily with meals.   90 tablet   3   . glimepiride (AMARYL) 4 MG tablet   Oral   Take 1 tablet (4 mg total) by mouth daily.   90 tablet   1   . Loperamide HCl (ANTI-DIARRHEAL PO)   Oral   Take by mouth as needed.         Marland Kitchen losartan (COZAAR) 50 MG tablet   Oral   Take 1 tablet (50 mg total) by mouth daily.    30 tablet   3   . nitroGLYCERIN (NITROSTAT) 0.4 MG SL tablet   Sublingual   Place 0.4 mg under the tongue every 5 (five) minutes as needed for chest pain.         Marland Kitchen norethindrone-ethinyl estradiol 1/35 (ORTHO-NOVUM, NORTREL,CYCLAFEM) tablet      Patient will take 2 tablet daily x 5 days then daily         . pantoprazole (PROTONIX) 40 MG tablet   Oral   Take 1 tablet (40 mg total) by mouth daily at 6 (six) AM.   90 tablet   1   . ticagrelor (BRILINTA) 90 MG TABS tablet   Oral   Take 1 tablet (90 mg total) by mouth 2 (two) times daily.   60 tablet   6     Allergies Lisinopril and Shellfish allergy  Family History  Problem Relation Age of Onset  . Diabetes Father   . Cancer Maternal Grandmother     lung  . Cancer Maternal Grandfather     prostate  . Diabetes Paternal Grandfather     Social History Social History  Substance Use Topics  . Smoking status: Former Smoker -- 15 years    Quit date: 03/10/2015  . Smokeless tobacco: Never Used  . Alcohol Use: No    Review of Systems  Constitutional: No fever. Positive for chills. Eyes: No visual changes. ENT: Positive for sore throat. Cardiovascular: Denies chest pain. Respiratory: Positive for shortness of breath. Gastrointestinal: No abdominal pain.  Positive for nausea, no vomiting.  No diarrhea.  No constipation. Genitourinary: Negative for dysuria. Musculoskeletal: Negative for back pain. Skin: Negative for rash. Neurological: Positive for headaches. Negative for focal weakness or numbness.  10-point ROS otherwise negative.  ____________________________________________   PHYSICAL EXAM:  VITAL SIGNS: ED Triage Vitals  Enc Vitals Group     BP 02/13/16 0018 174/100 mmHg     Pulse Rate 02/13/16 0018 112     Resp 02/13/16 0018 22     Temp 02/13/16 0018 98.1 F (36.7 C)     Temp Source 02/13/16 0018 Oral     SpO2 02/13/16 0018 100 %     Weight 02/13/16 0018 190 lb (86.183 kg)     Height 02/13/16  0018 5\' 4"  (1.626 m)     Head Cir --      Peak Flow --      Pain Score 02/13/16  0019 5     Pain Loc --      Pain Edu? --      Excl. in [place]? --     Constitutional: Alert and oriented. Well appearing and in no acute distress. Eyes: Conjunctivae are reddened bilaterally with slight exudate. PERRL. EOMI. Head: Atraumatic. Ears: Right earlobe with multiple sores in various stages of healing. Thin layer of hydrocortisone ointment which was applied by patient prior to arrival. Nose: Congestion/rhinnorhea. Mouth/Throat: Mucous membranes are moist.  Oropharynx mildly erythematous with baseline swollen tonsils. No tonsillar exudates or peritonsillar abscess. There is no hoarse or muffled voice. There is no drooling. Neck: No stridor.  No carotid bruits. Supple neck without meningismus. Hematological/Lymphatic/Immunilogical: No cervical lymphadenopathy. Cardiovascular: Normal rate, regular rhythm. Grossly normal heart sounds.  Good peripheral circulation. Respiratory: Normal respiratory effort.  No retractions. Lungs CTAB. Gastrointestinal: Soft and nontender. No distention. No abdominal bruits. No CVA tenderness. Musculoskeletal: No lower extremity tenderness nor edema.  No joint effusions. Neurologic:  Normal speech and language. No gross focal neurologic deficits are appreciated. No gait instability. Skin:  Skin is warm, dry and intact. No rash noted. No petechiae. Psychiatric: Mood and affect are normal. Speech and behavior are normal.  ____________________________________________   LABS (all labs ordered are listed, but only abnormal results are displayed)  Labs Reviewed  CBC WITH DIFFERENTIAL/PLATELET - Abnormal; Notable for the following:    Hemoglobin 11.5 (*)    HCT 34.3 (*)    MCV 77.9 (*)    RDW 15.3 (*)    All other components within normal limits  COMPREHENSIVE METABOLIC PANEL - Abnormal; Notable for the following:    Glucose, Bld 258 (*)    BUN 22 (*)    Creatinine, Ser 1.07  (*)    Calcium 8.6 (*)    Albumin 2.8 (*)    All other components within normal limits  BRAIN NATRIURETIC PEPTIDE - Abnormal; Notable for the following:    B Natriuretic Peptide 127.0 (*)    All other components within normal limits  CULTURE, GROUP A STREP [facility])  TROPONIN I  TROPONIN I  POCT RAPID STREP A   ____________________________________________  EKG  ED ECG REPORT I, Shine Mikes J, the attending physician, personally viewed and interpreted this ECG.   Date: 02/13/2016  EKG Time: 0023  Rate: 106  Rhythm: sinus tachycardia  Axis: Normal  Intervals:none  ST&T Change: Nonspecific  ____________________________________________  RADIOLOGY  Chest 2 view (view by me, interpreted per Dr. Gerilyn Nestle): No active cardiopulmonary disease. ____________________________________________   PROCEDURES  Procedure(s) performed: None  Critical Care performed: No  ____________________________________________   INITIAL IMPRESSION / ASSESSMENT AND PLAN / ED COURSE  Pertinent labs & imaging results that were available during my care of the patient were reviewed by me and considered in my medical decision making (see chart for details).  35 year old female with history of CAD, CHF who presents with flulike symptoms; shortness of breath this morning. Initial troponin is negative. Will obtain timed, 3 hour troponin. Patient remains hypertensive and mildly tachycardic. She tells me her Coreg dosage was recently doubled secondary to tachycardia and hypertension. In this setting of flulike illness, I will give a judicious fluid bolus for tachycardia. Will obtain rapid strep, administer Magic mouthwash for sore throat. Will apply Tobrex eyedrops for bilateral conjunctivitis. Will reassess.  ----------------------------------------- 4:12 AM on 02/13/2016 -----------------------------------------  Patient resting in no acute distress. Heart rate 98. Overall feels better. Repeat troponin  remains negative. She has an appointment this week  already with her cardiologist and will follow up as scheduled. She will continue Tobrex eyedrops for bilateral conjunctivitis. Strict return precautions given. Patient verbalizes understanding and agrees with plan of care. ____________________________________________   FINAL CLINICAL IMPRESSION(S) / ED DIAGNOSES  Final diagnoses:  Bilateral conjunctivitis  Viral syndrome      Paulette Blanch, MD 02/13/16 [phone]

## 2016-02-13 NOTE — Discharge Instructions (Signed)
1. Apply antibiotic eyedrops 2 drops to each eye every 4 hours while awake 7 days. 2. You may use a mouthwash as needed for throat pain. 3. Return to the ER for worsening symptoms, persistent vomiting, difficulty breathing or other concerns.  Viral Infections A viral infection can be caused by different types of viruses.Most viral infections are not serious and resolve on their own. However, some infections may cause severe symptoms and may lead to further complications. SYMPTOMS Viruses can frequently cause:  Minor sore throat.  Aches and pains.  Headaches.  Runny nose.  Different types of rashes.  Watery eyes.  Tiredness.  Cough.  Loss of appetite.  Gastrointestinal infections, resulting in nausea, vomiting, and diarrhea. These symptoms do not respond to antibiotics because the infection is not caused by bacteria. However, you might catch a bacterial infection following the viral infection. This is sometimes called a "superinfection." Symptoms of such a bacterial infection may include:  Worsening sore throat with pus and difficulty swallowing.  Swollen neck glands.  Chills and a high or persistent fever.  Severe headache.  Tenderness over the sinuses.  Persistent overall ill feeling (malaise), muscle aches, and tiredness (fatigue).  Persistent cough.  Yellow, green, or brown mucus production with coughing. HOME CARE INSTRUCTIONS   Only take over-the-counter or prescription medicines for pain, discomfort, diarrhea, or fever as directed by your caregiver.  Drink enough water and fluids to keep your urine clear or pale yellow. Sports drinks can provide valuable electrolytes, sugars, and hydration.  Get plenty of rest and maintain proper nutrition. Soups and broths with crackers or rice are fine. SEEK IMMEDIATE MEDICAL CARE IF:   You have severe headaches, shortness of breath, chest pain, neck pain, or an unusual rash.  You have uncontrolled vomiting, diarrhea,  or you are unable to keep down fluids.  You or your child has an oral temperature above 102 F (38.9 C), not controlled by medicine.  Your baby is older than 3 months with a rectal temperature of 102 F (38.9 C) or higher.  Your baby is 18 months old or younger with a rectal temperature of 100.4 F (38 C) or higher. MAKE SURE YOU:   Understand these instructions.  Will watch your condition.  Will get help right away if you are not doing well or get worse.   This information is not intended to replace advice given to you by your health care provider. Make sure you discuss any questions you have with your health care provider.   Document Released: 09/04/2005 Document Revised: 02/17/2012 Document Reviewed: 05/03/2015 Elsevier Interactive Patient Education 2016 Elsevier Inc.  Bacterial Conjunctivitis Bacterial conjunctivitis (commonly called pink eye) is redness, soreness, or puffiness (inflammation) of the white part of your eye. It is caused by a germ called bacteria. These germs can easily spread from person to person (contagious). Your eye often will become red or pink. Your eye may also become irritated, watery, or have a thick discharge.  HOME CARE   Apply a cool, clean washcloth over closed eyelids. Do this for 10-20 minutes, 3-4 times a day while you have pain.  Gently wipe away any fluid coming from the eye with a warm, wet washcloth or cotton ball.  Wash your hands often with soap and water. Use paper towels to dry your hands.  Do not share towels or washcloths.  Change or wash your pillowcase every day.  Do not use eye makeup until the infection is gone.  Do not use machines or  drive if your vision is blurry.  Stop using contact lenses. Do not use them again until your doctor says it is okay.  Do not touch the tip of the eye drop bottle or medicine tube with your fingers when you put medicine on the eye. GET HELP RIGHT AWAY IF:   Your eye is not better after 3  days of starting your medicine.  You have a yellowish fluid coming out of the eye.  You have more pain in the eye.  Your eye redness is spreading.  Your vision becomes blurry.  You have a fever or lasting symptoms for more than 2-3 days.  You have a fever and your symptoms suddenly get worse.  You have pain in the face.  Your face gets red or puffy (swollen). MAKE SURE YOU:   Understand these instructions.  Will watch this condition.  Will get help right away if you are not doing well or get worse.   This information is not intended to replace advice given to you by your health care provider. Make sure you discuss any questions you have with your health care provider.   Document Released: 09/03/2008 Document Revised: 11/11/2012 Document Reviewed: 07/31/2012 Elsevier Interactive Patient Education [employer].

## 2016-02-14 ENCOUNTER — Other Ambulatory Visit (INDEPENDENT_AMBULATORY_CARE_PROVIDER_SITE_OTHER): Payer: Medicaid Other

## 2016-02-14 DIAGNOSIS — I159 Secondary hypertension, unspecified: Secondary | ICD-10-CM | POA: Diagnosis not present

## 2016-02-14 LAB — CULTURE, GROUP A STREP (THRC)

## 2016-02-15 LAB — BASIC METABOLIC PANEL
BUN/Creatinine Ratio: 24 — ABNORMAL HIGH (ref 8–20)
BUN: 28 mg/dL — ABNORMAL HIGH (ref 6–20)
CO2: 18 mmol/L (ref 18–29)
Calcium: 8.3 mg/dL — ABNORMAL LOW (ref 8.7–10.2)
Chloride: 101 mmol/L (ref 96–106)
Creatinine, Ser: 1.18 mg/dL — ABNORMAL HIGH (ref 0.57–1.00)
GFR calc Af Amer: 69 mL/min/{1.73_m2} (ref >59)
GFR calc non Af Amer: 59 mL/min/{1.73_m2} — ABNORMAL LOW (ref >59)
Glucose: 264 mg/dL — ABNORMAL HIGH (ref 65–99)
Potassium: 4.9 mmol/L (ref 3.5–5.2)
Sodium: 136 mmol/L (ref 134–144)

## 2016-02-17 ENCOUNTER — Emergency Department
Admission: EM | Admit: 2016-02-17 | Discharge: 2016-02-17 | Disposition: A | Discharge status: Home / Self Care | Payer: Medicaid Other | Attending: Emergency Medicine | Admitting: Emergency Medicine

## 2016-02-17 ENCOUNTER — Encounter: Payer: Self-pay | Admitting: Emergency Medicine

## 2016-02-17 DIAGNOSIS — Z79899 Other long term (current) drug therapy: Secondary | ICD-10-CM | POA: Insufficient documentation

## 2016-02-17 DIAGNOSIS — E119 Type 2 diabetes mellitus without complications: Secondary | ICD-10-CM | POA: Diagnosis not present

## 2016-02-17 DIAGNOSIS — H6091 Unspecified otitis externa, right ear: Secondary | ICD-10-CM

## 2016-02-17 DIAGNOSIS — Z792 Long term (current) use of antibiotics: Secondary | ICD-10-CM | POA: Diagnosis not present

## 2016-02-17 DIAGNOSIS — I10 Essential (primary) hypertension: Secondary | ICD-10-CM

## 2016-02-17 DIAGNOSIS — Z87891 Personal history of nicotine dependence: Secondary | ICD-10-CM | POA: Insufficient documentation

## 2016-02-17 DIAGNOSIS — Z7982 Long term (current) use of aspirin: Secondary | ICD-10-CM | POA: Insufficient documentation

## 2016-02-17 LAB — BASIC METABOLIC PANEL
Anion gap: 6 (ref 5–15)
BUN: 18 mg/dL (ref 6–20)
CO2: 23 mmol/L (ref 22–32)
Calcium: 8.3 mg/dL — ABNORMAL LOW (ref 8.9–10.3)
Chloride: 105 mmol/L (ref 101–111)
Creatinine, Ser: 0.99 mg/dL (ref 0.44–1.00)
GFR calc Af Amer: >60 mL/min (ref >60)
GFR calc non Af Amer: >60 mL/min (ref >60)
Glucose, Bld: 274 mg/dL — ABNORMAL HIGH (ref 65–99)
Potassium: 3.8 mmol/L (ref 3.5–5.1)
Sodium: 134 mmol/L — ABNORMAL LOW (ref 135–145)

## 2016-02-17 LAB — TROPONIN I: Troponin I: 0.03 ng/mL (ref <0.031)

## 2016-02-17 LAB — CBC
HCT: 33.8 % — ABNORMAL LOW (ref 35.0–47.0)
Hemoglobin: 11.4 g/dL — ABNORMAL LOW (ref 12.0–16.0)
MCH: 26.2 pg (ref 26.0–34.0)
MCHC: 33.7 g/dL (ref 32.0–36.0)
MCV: 77.7 fL — ABNORMAL LOW (ref 80.0–100.0)
Platelets: 347 10*3/uL (ref 150–440)
RBC: 4.35 MIL/uL (ref 3.80–5.20)
RDW: 15.3 % — ABNORMAL HIGH (ref 11.5–14.5)
WBC: 9.3 10*3/uL (ref 3.6–11.0)

## 2016-02-17 MED ORDER — CIPROFLOXACIN-DEXAMETHASONE 0.3-0.1 % OT SUSP: 4.0000 [drp] | Freq: Two times a day (BID) | OTIC | Status: DC

## 2016-02-17 MED ORDER — HYDROCODONE-ACETAMINOPHEN 5-325 MG PO TABS: 1.0000 | ORAL_TABLET | ORAL | Status: DC | PRN

## 2016-02-17 NOTE — ED Provider Notes (Signed)
[facility] Emergency Department Provider Note  Time seen: 2:18 AM  I have reviewed the triage vital signs and the nursing notes.   HISTORY  Chief Complaint Hypertension    HPI [name] [name] is a 35 y.o. female with a past medical history of coronary artery disease status post MI last year with 2 stents placed, diabetes, hypertension, hyperlipidemia, presents to the emergency department with an elevated blood pressure. According to the patient she has been monitoring her blood pressure at home with a home cuff. She states it was 180/110, the patient became concerned and called the emergency department however they could not provider any recommendations besides coming in, the patient states she retook it again and it was 190/12/29/2011 into the emergency department for evaluation. The patient denies any chest pain, trouble breathing. She does state severe right ear pain which has been present for the past 3-4 days.     Past Medical History  Diagnosis Date  . CAD (coronary artery disease)     a. NSTEMI 03/2015: mLCx 100% s/p PCI/DES, RI 90% s/p PCI/DES, EF 35%; b. 04/2015 Relook Cath: LM nl, LAD 40p, RI patent stent, LCX patent stent, RCA nl, EF 55-65%.  . Chronic systolic CHF (congestive heart failure) ([place])     a. echo 03/2015: EF 30-35%, mild concentric LVH, severe HK of inf, inflat, & lat walls, mod MR, mild TR;  b. 04/2015 LV Gram: EF 55-65%.  . Ischemic cardiomyopathy     a. 03/2015 EF 30-35% post NSTEMI;  b. 04/2015 EF 55-65% on LV gram.  . DM2 (diabetes mellitus, type 2) ([place])     a. since 2002  . Tobacco abuse     a. quit 03/2015.  . Obesity   . Hyperlipidemia   . Hypotension     a. resulting in discontinuation of losartan and reduction in coreg dose.  Marland Kitchen Heavy menses     a. H/O IUD - expired in 2014 - remains in place.  . Iron deficiency anemia   . Kidney disease   . Uterine fibroid   . Hypertension   . MI (myocardial infarction) ([place]) 03/29/2015     Patient Active Problem List   Diagnosis Date Noted  . IBS (irritable bowel syndrome) 12/15/2015  . Proteinuria 08/30/2015  . Symptomatic anemia   . Coronary artery disease involving native coronary artery of native heart with angina pectoris with documented spasm ([place])   . Angina pectoris ([place])   . Iron deficiency anemia   . Heavy menses   . SOB (shortness of breath) 08/21/2015  . Chest pain 05/05/2015  . CAD in native artery 05/05/2015  . Pain in the chest   . Hyperlipidemia 04/13/2015  . CAD (coronary artery disease)   . Chronic systolic CHF (congestive heart failure) ([place])   . Ischemic cardiomyopathy   . DM2 (diabetes mellitus, type 2) ([place])   . Tobacco abuse   . Obesity     Past Surgical History  Procedure Laterality Date  . Mouth surgery    . Cardiac catheterization  4/16    x2 stent [facility]  . Cardiac catheterization N/A 05/05/2015    Procedure: Left Heart Cath and Coronary Angiography;  Surgeon: Peter M Martinique, MD;  Location: [place];  Service: Cardiovascular;  Laterality: N/A;    Current Outpatient Rx  Name  Route  Sig  Dispense  Refill  . carvedilol (COREG) 3.125 MG tablet   Oral   Take 1 tablet (3.125 mg total) by  mouth 2 (two) times daily.   60 tablet   3     Do not fill now. Pt will call when ready   . losartan (COZAAR) 50 MG tablet   Oral   Take 1 tablet (50 mg total) by mouth daily.   30 tablet   3   . aspirin EC 81 MG tablet   Oral   Take 81 mg by mouth daily.         Marland Kitchen atorvastatin (LIPITOR) 20 MG tablet   Oral   Take 1 tablet (20 mg total) by mouth daily.   30 tablet   6   . ferrous sulfate 325 (65 FE) MG tablet   Oral   Take 1 tablet (325 mg total) by mouth 3 (three) times daily with meals.   90 tablet   3   . glimepiride (AMARYL) 4 MG tablet   Oral   Take 1 tablet (4 mg total) by mouth daily.   90 tablet   1   . Loperamide HCl (ANTI-DIARRHEAL PO)   Oral   Take by mouth as needed.         . magic mouthwash  SOLN   Oral   Take 5 mLs by mouth 3 (three) times daily as needed for mouth pain.   70 mL   0   . nitroGLYCERIN (NITROSTAT) 0.4 MG SL tablet   Sublingual   Place 0.4 mg under the tongue every 5 (five) minutes as needed for chest pain.         Marland Kitchen norethindrone-ethinyl estradiol 1/35 (ORTHO-NOVUM, NORTREL,CYCLAFEM) tablet      Patient will take 2 tablet daily x 5 days then daily         . pantoprazole (PROTONIX) 40 MG tablet   Oral   Take 1 tablet (40 mg total) by mouth daily at 6 (six) AM.   90 tablet   1   . ticagrelor (BRILINTA) 90 MG TABS tablet   Oral   Take 1 tablet (90 mg total) by mouth 2 (two) times daily.   60 tablet   6   . tobramycin (TOBREX) 0.3 % ophthalmic solution   Both Eyes   Place 2 drops into both eyes every 4 (four) hours.   5 mL   1     Allergies Lisinopril and Shellfish allergy  Family History  Problem Relation Age of Onset  . Diabetes Father   . Cancer Maternal Grandmother     lung  . Cancer Maternal Grandfather     prostate  . Diabetes Paternal Grandfather     Social History Social History  Substance Use Topics  . Smoking status: Former Smoker -- 15 years    Quit date: 03/10/2015  . Smokeless tobacco: Never Used  . Alcohol Use: No    Review of Systems Constitutional: Negative for fever. ENT: Positive for right ear pain. Cardiovascular: Negative for chest pain. Respiratory: Negative for shortness of breath. Gastrointestinal: Negative for abdominal pain Neurological: Negative for headache 10-point ROS otherwise negative.  ____________________________________________   PHYSICAL EXAM:  VITAL SIGNS: ED Triage Vitals  Enc Vitals Group     BP 02/17/16 0041 180/95 mmHg     Pulse Rate 02/17/16 0041 105     Resp 02/17/16 0041 18     Temp 02/17/16 0041 98.5 F (36.9 C)     Temp Source 02/17/16 0041 Oral     SpO2 02/17/16 0041 100 %     Weight 02/17/16 0041 190 lb (86.183  kg)     Height 02/17/16 0041 5\' 4"  (1.626 m)      Head Cir --      Peak Flow --      Pain Score 02/17/16 0042 5     Pain Loc --      Pain Edu? --      Excl. in [place]? --     Constitutional: Alert and oriented. Well appearing and in no distress. Eyes: Normal exam ENT   Head: Normocephalic and atraumatic. Patient does have mild erythema of the right auditory canal, with a slight amount of drainage most consistent with an early or resolving otitis externa.   Mouth/Throat: Mucous membranes are moist. Cardiovascular: Normal rate, regular rhythm. No murmur Respiratory: Normal respiratory effort without tachypnea nor retractions. Breath sounds are clear  Gastrointestinal: Soft and nontender. No distention.   Musculoskeletal: Nontender with normal range of motion in all extremities.  Neurologic:  Normal speech and language. No gross focal neurologic deficits Skin:  Skin is warm, dry and intact.  Psychiatric: Mood and affect are normal. Speech and behavior are normal.   ____________________________________________    EKG  EKG reviewed and interpreted by myself shows sinus tachycardia 103 bpm, narrow QRS, normal axis, normal intervals, nonspecific ST changes. No ST elevations.  ____________________________________________    INITIAL IMPRESSION / ASSESSMENT AND PLAN / ED COURSE  Pertinent labs & imaging results that were available during my care of the patient were reviewed by me and considered in my medical decision making (see chart for details).  Patient presents the emergency department for hypertension. Patient currently 150/103. Patient remains asymptomatic, denies any chest pain or trouble breathing at any point tonight. Patient states her blood pressures have been elevated over the past 2 weeks, she is working with Dr. Fletcher Anon, who recently adjusted one of her blood pressure medications approximately 5-6 days ago. Patient's labs are largely within normal limits, glucose of 274 which patient states is good for her. Troponin is  negative. EKG shows no acute findings. We'll discharge the patient home with cardiology follow-up. Patient does have an exam consistent with either an early or resolving otitis externa. Given the patient's discomfort will place on a short course of pain medication as well as Ciprodex drops. Patient agreeable.  ____________________________________________   FINAL CLINICAL IMPRESSION(S) / ED DIAGNOSES  Right otitis externa Hypertension   Harvest Dark, MD 02/17/16 [phone]

## 2016-02-17 NOTE — ED Notes (Signed)
Pt. States increased blood pressure tonight.  Pt. States HA.  Pt. States cardiac hx.  Pt. States having a heart attack last year and had two cardiac stents placed.  Pt. States she was seen here this past Tuesday for shortness of breath.

## 2016-02-17 NOTE — Discharge Instructions (Signed)
Otitis Externa Otitis externa is a bacterial or fungal infection of the outer ear canal. This is the area from the eardrum to the outside of the ear. Otitis externa is sometimes called "swimmer's ear." CAUSES  Possible causes of infection include:  Swimming in dirty water.  Moisture remaining in the ear after swimming or bathing.  Mild injury (trauma) to the ear.  Objects stuck in the ear (foreign body).  Cuts or scrapes (abrasions) on the outside of the ear. SIGNS AND SYMPTOMS  The first symptom of infection is often itching in the ear canal. Later signs and symptoms may include swelling and redness of the ear canal, ear pain, and yellowish-white fluid (pus) coming from the ear. The ear pain may be worse when pulling on the earlobe. DIAGNOSIS  Your health care provider will perform a physical exam. A sample of fluid may be taken from the ear and examined for bacteria or fungi. TREATMENT  Antibiotic ear drops are often given for 10 to 14 days. Treatment may also include pain medicine or corticosteroids to reduce itching and swelling. HOME CARE INSTRUCTIONS   Apply antibiotic ear drops to the ear canal as prescribed by your health care provider.  Take medicines only as directed by your health care provider.  If you have diabetes, follow any additional treatment instructions from your health care provider.  Keep all follow-up visits as directed by your health care provider. PREVENTION   Keep your ear dry. Use the corner of a towel to absorb water out of the ear canal after swimming or bathing.  Avoid scratching or putting objects inside your ear. This can damage the ear canal or remove the protective wax that lines the canal. This makes it easier for bacteria and fungi to grow.  Avoid swimming in lakes, polluted water, or poorly chlorinated pools.  You may use ear drops made of rubbing alcohol and vinegar after swimming. Combine equal parts of white vinegar and alcohol in a bottle.  Put 3 or 4 drops into each ear after swimming. SEEK MEDICAL CARE IF:   You have a fever.  Your ear is still red, swollen, painful, or draining pus after 3 days.  Your redness, swelling, or pain gets worse.  You have a severe headache.  You have redness, swelling, pain, or tenderness in the area behind your ear. MAKE SURE YOU:   Understand these instructions.  Will watch your condition.  Will get help right away if you are not doing well or get worse.   This information is not intended to replace advice given to you by your health care provider. Make sure you discuss any questions you have with your health care provider.   Document Released: 11/25/2005 Document Revised: 12/16/2014 Document Reviewed: 12/12/2011 Elsevier Interactive Patient Education 2016 [employer].  Hypertension Hypertension is another name for high blood pressure. High blood pressure forces your heart to work harder to pump blood. A blood pressure reading has two numbers, which includes a higher number over a lower number (example: 110/72). HOME CARE   Have your blood pressure rechecked by your doctor.  Only take medicine as told by your doctor. Follow the directions carefully. The medicine does not work as well if you skip doses. Skipping doses also puts you at risk for problems.  Do not smoke.  Monitor your blood pressure at home as told by your doctor. GET HELP IF:  You think you are having a reaction to the medicine you are taking.  You have  repeat headaches or feel dizzy.  You have puffiness (swelling) in your ankles.  You have trouble with your vision. GET HELP RIGHT AWAY IF:   You get a very bad headache and are confused.  You feel weak, numb, or faint.  You get chest or belly (abdominal) pain.  You throw up (vomit).  You cannot breathe very well. MAKE SURE YOU:   Understand these instructions.  Will watch your condition.  Will get help right away if you are not doing well or  get worse.   This information is not intended to replace advice given to you by your health care provider. Make sure you discuss any questions you have with your health care provider.   Document Released: 05/13/2008 Document Revised: 11/30/2013 Document Reviewed: 09/17/2013 Elsevier Interactive Patient Education [employer].

## 2016-02-17 NOTE — ED Notes (Signed)
Pt complains of right sided frontal headache, clear nasal drainage and right ear pain.

## 2016-02-19 ENCOUNTER — Telehealth: Payer: Self-pay | Admitting: Cardiovascular Disease

## 2016-02-19 NOTE — Telephone Encounter (Signed)
Called patient to make ED fu appointment  She stated she went in for Ear infection  Does not feel like she needs to see [place].

## 2016-02-20 ENCOUNTER — Encounter: Payer: Self-pay | Admitting: Emergency Medicine

## 2016-02-20 ENCOUNTER — Emergency Department
Admission: EM | Admit: 2016-02-20 | Discharge: 2016-02-20 | Disposition: A | Discharge status: Home / Self Care | Payer: Medicaid Other | Attending: Emergency Medicine | Admitting: Emergency Medicine

## 2016-02-20 DIAGNOSIS — E119 Type 2 diabetes mellitus without complications: Secondary | ICD-10-CM | POA: Insufficient documentation

## 2016-02-20 DIAGNOSIS — R42 Dizziness and giddiness: Secondary | ICD-10-CM | POA: Diagnosis not present

## 2016-02-20 DIAGNOSIS — Z79899 Other long term (current) drug therapy: Secondary | ICD-10-CM | POA: Insufficient documentation

## 2016-02-20 DIAGNOSIS — Z87891 Personal history of nicotine dependence: Secondary | ICD-10-CM | POA: Diagnosis not present

## 2016-02-20 DIAGNOSIS — H9201 Otalgia, right ear: Secondary | ICD-10-CM | POA: Diagnosis present

## 2016-02-20 DIAGNOSIS — Z7982 Long term (current) use of aspirin: Secondary | ICD-10-CM | POA: Diagnosis not present

## 2016-02-20 DIAGNOSIS — Z7984 Long term (current) use of oral hypoglycemic drugs: Secondary | ICD-10-CM | POA: Diagnosis not present

## 2016-02-20 DIAGNOSIS — I10 Essential (primary) hypertension: Secondary | ICD-10-CM | POA: Diagnosis not present

## 2016-02-20 DIAGNOSIS — H6091 Unspecified otitis externa, right ear: Secondary | ICD-10-CM | POA: Diagnosis not present

## 2016-02-20 DIAGNOSIS — L0100 Impetigo, unspecified: Secondary | ICD-10-CM

## 2016-02-20 MED ORDER — AMOXICILLIN-POT CLAVULANATE 875-125 MG PO TABS
1.0000 | ORAL_TABLET | Freq: Once | ORAL | Status: AC
  Administered 2016-02-20: 1 via ORAL
  Filled 2016-02-20: qty 1

## 2016-02-20 MED ORDER — SULFAMETHOXAZOLE-TRIMETHOPRIM 800-160 MG PO TABS
1.0000 | ORAL_TABLET | Freq: Once | ORAL | Status: AC
  Administered 2016-02-20: 1 via ORAL
  Filled 2016-02-20: qty 1

## 2016-02-20 MED ORDER — SULFAMETHOXAZOLE-TRIMETHOPRIM 800-160 MG PO TABS: 1.0000 | ORAL_TABLET | Freq: Two times a day (BID) | ORAL | Status: AC

## 2016-02-20 MED ORDER — AMOXICILLIN-POT CLAVULANATE 875-125 MG PO TABS: 1.0000 | ORAL_TABLET | Freq: Two times a day (BID) | ORAL | Status: AC

## 2016-02-20 NOTE — ED Notes (Signed)
Report from Henry, RN  

## 2016-02-20 NOTE — ED Notes (Signed)
MD at bedside. 

## 2016-02-20 NOTE — ED Notes (Signed)
Pt presents to ED with right ear pain. Was seen in this ED Tuesday and Friday last week for the same with no improvement. Pt states there is crusting around and inside her ear with a knot above and next to her ear that are painful to touch. Unsure if she has a fever at home.

## 2016-02-20 NOTE — Discharge Instructions (Signed)
Otitis Externa Otitis externa is a bacterial or fungal infection of the outer ear canal. This is the area from the eardrum to the outside of the ear. Otitis externa is sometimes called "swimmer's ear." CAUSES  Possible causes of infection include:  Swimming in dirty water.  Moisture remaining in the ear after swimming or bathing.  Mild injury (trauma) to the ear.  Objects stuck in the ear (foreign body).  Cuts or scrapes (abrasions) on the outside of the ear. SIGNS AND SYMPTOMS  The first symptom of infection is often itching in the ear canal. Later signs and symptoms may include swelling and redness of the ear canal, ear pain, and yellowish-white fluid (pus) coming from the ear. The ear pain may be worse when pulling on the earlobe. DIAGNOSIS  Your health care provider will perform a physical exam. A sample of fluid may be taken from the ear and examined for bacteria or fungi. TREATMENT  Antibiotic ear drops are often given for 10 to 14 days. Treatment may also include pain medicine or corticosteroids to reduce itching and swelling. HOME CARE INSTRUCTIONS   Apply antibiotic ear drops to the ear canal as prescribed by your health care provider.  Take medicines only as directed by your health care provider.  If you have diabetes, follow any additional treatment instructions from your health care provider.  Keep all follow-up visits as directed by your health care provider. PREVENTION   Keep your ear dry. Use the corner of a towel to absorb water out of the ear canal after swimming or bathing.  Avoid scratching or putting objects inside your ear. This can damage the ear canal or remove the protective wax that lines the canal. This makes it easier for bacteria and fungi to grow.  Avoid swimming in lakes, polluted water, or poorly chlorinated pools.  You may use ear drops made of rubbing alcohol and vinegar after swimming. Combine equal parts of white vinegar and alcohol in a bottle.  Put 3 or 4 drops into each ear after swimming. SEEK MEDICAL CARE IF:   You have a fever.  Your ear is still red, swollen, painful, or draining pus after 3 days.  Your redness, swelling, or pain gets worse.  You have a severe headache.  You have redness, swelling, pain, or tenderness in the area behind your ear. MAKE SURE YOU:   Understand these instructions.  Will watch your condition.  Will get help right away if you are not doing well or get worse.   This information is not intended to replace advice given to you by your health care provider. Make sure you discuss any questions you have with your health care provider.   Document Released: 11/25/2005 Document Revised: 12/16/2014 Document Reviewed: 12/12/2011 Elsevier Interactive Patient Education 2016 [place], Adult Impetigo is an infection of the skin. It commonly occurs in young children, but it can also occur in adults. The infection causes itchy blisters and sores that produce brownish-yellow fluid. As the fluid dries, it forms a thick, honey-colored crust. These skin changes usually occur on the face but can also affect other areas of the body. Impetigo usually goes away in 7-10 days with treatment. CAUSES Impetigo is caused by two types of bacteria. It may be caused by staphylococci or streptococci bacteria. These bacteria cause impetigo when they get under the surface of the skin. This often happens after some damage to the skin, such as damage from:  Cuts, scrapes, or scratches.  Insect  bites, especially when you scratch the area of a bite.  Chickenpox or other illnesses that cause open skin sores.  Nail biting or chewing. Impetigo is contagious and can spread easily from one person to another. This may occur through close skin contact or by sharing towels, clothing, or other items with a person who has the infection. RISK FACTORS Some things that can increase the risk of getting this infection  include:  Playing sports that include skin-to-skin contact with others.  Having a skin condition with open sores.  Having many skin cuts or scrapes.  Living in an area that has high humidity levels.  Having poor hygiene.  Having high levels of staphylococci in your nose. SIGNS AND SYMPTOMS Impetigo usually starts out as small blisters, often on the face. The blisters then break open and turn into tiny sores (lesions) with a yellow crust. In some cases, the blisters cause itching or burning. With scratching, irritation, or lack of treatment, these small lesions may get larger. Scratching can also cause impetigo to spread to other parts of the body. The bacteria can get under the fingernails and spread when you touch another area of your skin. Other possible symptoms include:  Larger blisters.  Pus.  Swollen lymph glands. DIAGNOSIS This condition is usually diagnosed during a physical exam. A skin sample or sample of fluid from a blister may be taken for lab tests that involve growing bacteria (culture test). This can help confirm the diagnosis or help determine the best treatment. TREATMENT Mild impetigo can be treated with prescription antibiotic cream. Oral antibiotic medicine may be used in more severe cases. Medicines for itching may also be used. HOME CARE INSTRUCTIONS  Take medicines only as directed by your health care provider.  To help prevent impetigo from spreading to other body areas:  Keep your fingernails short and clean.  Do not scratch the blisters or sores.  Cover infected areas, if necessary, to keep from scratching.  Gently wash the infected areas with antibiotic soap and water.  Soak crusted areas in warm, soapy water using antibiotic soap.  Gently rub the areas to remove crusts. Do not scrub.  Wash your hands often to avoid spreading this infection.  Stay home until you have used an antibiotic cream for 48 hours (2 days) or an oral antibiotic medicine  for 24 hours (1 day). You should only return to work and activities with other people if your skin shows significant improvement. PREVENTION  To keep the infection from spreading:  Stay home until you have used an antibiotic cream for 48 hours or an oral antibiotic for 24 hours.  Wash your hands often.  Do not engage in skin-to-skin contact with other people while you have still have blisters.  Do not share towels, washcloths, or bedding with others while you have the infection. SEEK MEDICAL CARE IF:  You develop more blisters or sores despite treatment.  Other family members get sores.  Your skin sores are not improving after 48 hours of treatment.  You have a fever. SEEK IMMEDIATE MEDICAL CARE IF:  You see spreading redness or swelling of the skin around your sores.  You see red streaks coming from your sores.  You develop a sore throat.   This information is not intended to replace advice given to you by your health care provider. Make sure you discuss any questions you have with your health care provider.   Document Released: 12/16/2014 Document Reviewed: 12/16/2014 Elsevier Interactive Patient Education 2016 Elsevier  Inc. ° °

## 2016-02-20 NOTE — ED Provider Notes (Signed)
[facility] Emergency Department Provider Note  ____________________________________________  Time seen: 3:15 AM  I have reviewed the triage vital signs and the nursing notes.   HISTORY  Chief Complaint Otalgia      HPI [name] [name] is a 35 y.o. female presents with continued right ear pain and drainage after being seen in the emergency Department last Tuesday and Friday. Patient denies any fever no headache or dizziness. Patient states that she has noticed increased drainage and crusting around the right ear. Patient was prescribe Ciprodex for otitis externaon 02/17/2016     Past Medical History  Diagnosis Date  . CAD (coronary artery disease)     a. NSTEMI 03/2015: mLCx 100% s/p PCI/DES, RI 90% s/p PCI/DES, EF 35%; b. 04/2015 Relook Cath: LM nl, LAD 40p, RI patent stent, LCX patent stent, RCA nl, EF 55-65%.  . Chronic systolic CHF (congestive heart failure) ([place])     a. echo 03/2015: EF 30-35%, mild concentric LVH, severe HK of inf, inflat, & lat walls, mod MR, mild TR;  b. 04/2015 LV Gram: EF 55-65%.  . Ischemic cardiomyopathy     a. 03/2015 EF 30-35% post NSTEMI;  b. 04/2015 EF 55-65% on LV gram.  . DM2 (diabetes mellitus, type 2) ([place])     a. since 2002  . Tobacco abuse     a. quit 03/2015.  . Obesity   . Hyperlipidemia   . Hypotension     a. resulting in discontinuation of losartan and reduction in coreg dose.  Marland Kitchen Heavy menses     a. H/O IUD - expired in 2014 - remains in place.  . Iron deficiency anemia   . Kidney disease   . Uterine fibroid   . Hypertension   . MI (myocardial infarction) ([place]) 03/29/2015    Patient Active Problem List   Diagnosis Date Noted  . IBS (irritable bowel syndrome) 12/15/2015  . Proteinuria 08/30/2015  . Symptomatic anemia   . Coronary artery disease involving native coronary artery of native heart with angina pectoris with documented spasm ([place])   . Angina pectoris ([place])   . Iron deficiency anemia   . Heavy  menses   . SOB (shortness of breath) 08/21/2015  . Chest pain 05/05/2015  . CAD in native artery 05/05/2015  . Pain in the chest   . Hyperlipidemia 04/13/2015  . CAD (coronary artery disease)   . Chronic systolic CHF (congestive heart failure) ([place])   . Ischemic cardiomyopathy   . DM2 (diabetes mellitus, type 2) ([place])   . Tobacco abuse   . Obesity     Past Surgical History  Procedure Laterality Date  . Mouth surgery    . Cardiac catheterization  4/16    x2 stent [facility]  . Cardiac catheterization N/A 05/05/2015    Procedure: Left Heart Cath and Coronary Angiography;  Surgeon: Peter M Martinique, MD;  Location: [facility];  Service: Cardiovascular;  Laterality: N/A;    Current Outpatient Rx  Name  Route  Sig  Dispense  Refill  . aspirin EC 81 MG tablet   Oral   Take 81 mg by mouth daily.         Marland Kitchen atorvastatin (LIPITOR) 20 MG tablet   Oral   Take 1 tablet (20 mg total) by mouth daily.   30 tablet   6   . carvedilol (COREG) 3.125 MG tablet   Oral   Take 1 tablet (3.125 mg total) by mouth 2 (two) times daily.  60 tablet   3     Do not fill now. Pt will call when ready   . ciprofloxacin-dexamethasone (CIPRODEX) otic suspension   Right Ear   Place 4 drops into the right ear 2 (two) times daily. For the next 7 days   7.5 mL   0   . ferrous sulfate 325 (65 FE) MG tablet   Oral   Take 1 tablet (325 mg total) by mouth 3 (three) times daily with meals.   90 tablet   3   . glimepiride (AMARYL) 4 MG tablet   Oral   Take 1 tablet (4 mg total) by mouth daily.   90 tablet   1   . HYDROcodone-acetaminophen (NORCO/VICODIN) 5-325 MG tablet   Oral   Take 1 tablet by mouth every 4 (four) hours as needed for moderate pain.   15 tablet   0   . loperamide (IMODIUM) 2 MG capsule   Oral   Take 2 mg by mouth as needed for diarrhea or loose stools.         Marland Kitchen losartan (COZAAR) 50 MG tablet   Oral   Take 1 tablet (50 mg total) by mouth daily.   30 tablet   3   .  nitroGLYCERIN (NITROSTAT) 0.4 MG SL tablet   Sublingual   Place 0.4 mg under the tongue every 5 (five) minutes as needed for chest pain. Reported on 02/20/2016         . norethindrone-ethinyl estradiol 1/35 (ORTHO-NOVUM, NORTREL,CYCLAFEM) tablet   Oral   Take 1 tablet by mouth daily.         . pantoprazole (PROTONIX) 40 MG tablet   Oral   Take 1 tablet (40 mg total) by mouth daily at 6 (six) AM.   90 tablet   1   . ticagrelor (BRILINTA) 90 MG TABS tablet   Oral   Take 1 tablet (90 mg total) by mouth 2 (two) times daily.   60 tablet   6     Allergies Lisinopril and Shellfish allergy  Family History  Problem Relation Age of Onset  . Diabetes Father   . Cancer Maternal Grandmother     lung  . Cancer Maternal Grandfather     prostate  . Diabetes Paternal Grandfather     Social History Social History  Substance Use Topics  . Smoking status: Former Smoker -- 15 years    Quit date: 03/10/2015  . Smokeless tobacco: Never Used  . Alcohol Use: No    Review of Systems  Constitutional: Negative for fever. Eyes: Negative for visual changes. ENT: Negative for sore throat.Positive for Right ear pain and drainage Cardiovascular: Negative for chest pain. Respiratory: Negative for shortness of breath. Gastrointestinal: Negative for abdominal pain, vomiting and diarrhea. Genitourinary: Negative for dysuria. Musculoskeletal: Negative for back pain. Skin: Negative for rash. Neurological: Negative for headaches, focal weakness or numbness.   10-point ROS otherwise negative.  ____________________________________________   PHYSICAL EXAM:  VITAL SIGNS: ED Triage Vitals  Enc Vitals Group     BP 02/20/16 0056 157/91 mmHg     Pulse Rate 02/20/16 0056 109     Resp 02/20/16 0056 19     Temp 02/20/16 0056 98.9 F (37.2 C)     Temp Source 02/20/16 0056 Oral     SpO2 02/20/16 0056 100 %     Weight 02/20/16 0056 190 lb (86.183 kg)     Height 02/20/16 0056 5\' 4"  (1.626 m)  Head Cir --      Peak Flow --      Pain Score 02/20/16 0105 10     Pain Loc --      Pain Edu? --      Excl. in [place]? --      Constitutional: Alert and oriented. Well appearing and in no distress. Eyes: Conjunctivae are normal. PERRL. Normal extraocular movements. ENT   Head: Normocephalic and atraumatic.   Nose: No congestion/rhinnorhea.   Mouth/Throat: Mucous membranes are moist.   Neck: No stridor. Ears: Whitish yellow discharge from the right external auditory canal with associated crusting. Crusting of the outer ear Hematological/Lymphatic/Immunilogical: No cervical lymphadenopathy. Cardiovascular: Normal rate, regular rhythm. Normal and symmetric distal pulses are present in all extremities. No murmurs, rubs, or gallops. Respiratory: Normal respiratory effort without tachypnea nor retractions. Breath sounds are clear and equal bilaterally. No wheezes/rales/rhonchi. Gastrointestinal: Soft and nontender. No distention. There is no CVA tenderness. Genitourinary: deferred Musculoskeletal: Nontender with normal range of motion in all extremities. No joint effusions.  No lower extremity tenderness nor edema. Neurologic:  Normal speech and language. No gross focal neurologic deficits are appreciated. Speech is normal.  Skin:  Skin is warm, dry and intact. No rash noted. Psychiatric: Mood and affect are normal. Speech and behavior are normal. Patient exhibits appropriate insight and judgment.    INITIAL IMPRESSION / ASSESSMENT AND PLAN / ED COURSE  Pertinent labs & imaging results that were available during my care of the patient were reviewed by me and considered in my medical decision making (see chart for details).  History of physical exam consistent with acute otitis externa with possible impetigo patient received Augmentin and Bactrim  ____________________________________________   FINAL CLINICAL IMPRESSION(S) / ED DIAGNOSES  Final diagnoses:  Right otitis  externa  Impetigo      Gregor Hams, MD 02/20/16 [phone]

## 2016-02-20 NOTE — ED Notes (Signed)
Pt alert and oriented X4, active, cooperative, pt in NAD. RR even and unlabored, color WNL.  Pt informed to return if any life threatening symptoms occur.   

## 2016-02-21 ENCOUNTER — Telehealth: Payer: Self-pay

## 2016-02-21 DIAGNOSIS — H6690 Otitis media, unspecified, unspecified ear: Secondary | ICD-10-CM

## 2016-02-21 NOTE — Telephone Encounter (Signed)
Patient called and has been in the Emergency Room a few times for an ear infection. They referred patient, but the referral needs to come from [place] for ENT so she can be seen ASAP. Patient's PCP is [facility].

## 2016-02-21 NOTE — Telephone Encounter (Signed)
REferral generated.

## 2016-02-24 ENCOUNTER — Encounter: Payer: Self-pay | Admitting: Urgent Care

## 2016-02-24 ENCOUNTER — Emergency Department
Admission: EM | Admit: 2016-02-24 | Discharge: 2016-02-24 | Disposition: A | Discharge status: Home / Self Care | Payer: Medicaid Other | Attending: Emergency Medicine | Admitting: Emergency Medicine

## 2016-02-24 DIAGNOSIS — Z79899 Other long term (current) drug therapy: Secondary | ICD-10-CM | POA: Diagnosis not present

## 2016-02-24 DIAGNOSIS — Z793 Long term (current) use of hormonal contraceptives: Secondary | ICD-10-CM | POA: Insufficient documentation

## 2016-02-24 DIAGNOSIS — Z792 Long term (current) use of antibiotics: Secondary | ICD-10-CM | POA: Insufficient documentation

## 2016-02-24 DIAGNOSIS — R202 Paresthesia of skin: Secondary | ICD-10-CM | POA: Diagnosis present

## 2016-02-24 DIAGNOSIS — Z7982 Long term (current) use of aspirin: Secondary | ICD-10-CM | POA: Insufficient documentation

## 2016-02-24 DIAGNOSIS — Z7984 Long term (current) use of oral hypoglycemic drugs: Secondary | ICD-10-CM | POA: Diagnosis not present

## 2016-02-24 DIAGNOSIS — J029 Acute pharyngitis, unspecified: Secondary | ICD-10-CM | POA: Diagnosis not present

## 2016-02-24 DIAGNOSIS — E119 Type 2 diabetes mellitus without complications: Secondary | ICD-10-CM | POA: Diagnosis not present

## 2016-02-24 DIAGNOSIS — I10 Essential (primary) hypertension: Secondary | ICD-10-CM | POA: Diagnosis not present

## 2016-02-24 DIAGNOSIS — F419 Anxiety disorder, unspecified: Secondary | ICD-10-CM | POA: Insufficient documentation

## 2016-02-24 DIAGNOSIS — Z87891 Personal history of nicotine dependence: Secondary | ICD-10-CM | POA: Insufficient documentation

## 2016-02-24 MED ORDER — DIPHENHYDRAMINE HCL 25 MG PO CAPS
25.0000 mg | ORAL_CAPSULE | Freq: Once | ORAL | Status: AC
  Administered 2016-02-24: 25 mg via ORAL
  Filled 2016-02-24: qty 1

## 2016-02-24 NOTE — ED Notes (Signed)
Discussed discharge instructions and follow-up care with patient. No questions or concerns at this time. Pt stable at discharge.  

## 2016-02-24 NOTE — ED Notes (Addendum)
Patient presents with reports that her tongue "feels thick". Patient denies painful swallowing, however reports that it is "hard to swallow". No increased WOB noted. Patient able to handle oral secretions. Patient speaking with no changes to voice quality.

## 2016-02-24 NOTE — ED Provider Notes (Signed)
[facility] Emergency Department Provider Note  ____________________________________________  Time seen: On arrival  I have reviewed the triage vital signs and the nursing notes.   HISTORY  Chief Complaint Throat itching   HPI [name] [name] is a 35 y.o. female who presents with complaints of throat itching. Patient reports she woke up at 1 in the morning and felt like she had something in her throat. She had no difficulty breathing. She reports she became very anxious and tingling in her lips. She really came to the emergency department where her symptoms rapidly improved. Upon my seeing the patient she complains only of a vague tickle in her throat. She has recently on antibiotics for an ear infection. No fevers or chills. No difficulty swallowing. No rash. No chest pain.    Past Medical History  Diagnosis Date  . CAD (coronary artery disease)     a. NSTEMI 03/2015: mLCx 100% s/p PCI/DES, RI 90% s/p PCI/DES, EF 35%; b. 04/2015 Relook Cath: LM nl, LAD 40p, RI patent stent, LCX patent stent, RCA nl, EF 55-65%.  . Chronic systolic CHF (congestive heart failure) ([place])     a. echo 03/2015: EF 30-35%, mild concentric LVH, severe HK of inf, inflat, & lat walls, mod MR, mild TR;  b. 04/2015 LV Gram: EF 55-65%.  . Ischemic cardiomyopathy     a. 03/2015 EF 30-35% post NSTEMI;  b. 04/2015 EF 55-65% on LV gram.  . DM2 (diabetes mellitus, type 2) ([place])     a. since 2002  . Tobacco abuse     a. quit 03/2015.  . Obesity   . Hyperlipidemia   . Hypotension     a. resulting in discontinuation of losartan and reduction in coreg dose.  Marland Kitchen Heavy menses     a. H/O IUD - expired in 2014 - remains in place.  . Iron deficiency anemia   . Kidney disease   . Uterine fibroid   . Hypertension   . MI (myocardial infarction) ([place]) 03/29/2015    Patient Active Problem List   Diagnosis Date Noted  . IBS (irritable bowel syndrome) 12/15/2015  . Proteinuria 08/30/2015  . Symptomatic  anemia   . Coronary artery disease involving native coronary artery of native heart with angina pectoris with documented spasm ([place])   . Angina pectoris ([place])   . Iron deficiency anemia   . Heavy menses   . SOB (shortness of breath) 08/21/2015  . Chest pain 05/05/2015  . CAD in native artery 05/05/2015  . Pain in the chest   . Hyperlipidemia 04/13/2015  . CAD (coronary artery disease)   . Chronic systolic CHF (congestive heart failure) ([place])   . Ischemic cardiomyopathy   . DM2 (diabetes mellitus, type 2) ([place])   . Tobacco abuse   . Obesity     Past Surgical History  Procedure Laterality Date  . Mouth surgery    . Cardiac catheterization  4/16    x2 stent [facility]  . Cardiac catheterization N/A 05/05/2015    Procedure: Left Heart Cath and Coronary Angiography;  Surgeon: Peter M Martinique, MD;  Location: [facility];  Service: Cardiovascular;  Laterality: N/A;    Current Outpatient Rx  Name  Route  Sig  Dispense  Refill  . amoxicillin-clavulanate (AUGMENTIN) 875-125 MG tablet   Oral   Take 1 tablet by mouth 2 (two) times daily.   20 tablet   0   . aspirin EC 81 MG tablet   Oral  Take 81 mg by mouth daily.         Marland Kitchen atorvastatin (LIPITOR) 20 MG tablet   Oral   Take 1 tablet (20 mg total) by mouth daily.   30 tablet   6   . carvedilol (COREG) 3.125 MG tablet   Oral   Take 1 tablet (3.125 mg total) by mouth 2 (two) times daily.   60 tablet   3     Do not fill now. Pt will call when ready   . ciprofloxacin-dexamethasone (CIPRODEX) otic suspension   Right Ear   Place 4 drops into the right ear 2 (two) times daily. For the next 7 days   7.5 mL   0   . ferrous sulfate 325 (65 FE) MG tablet   Oral   Take 1 tablet (325 mg total) by mouth 3 (three) times daily with meals.   90 tablet   3   . glimepiride (AMARYL) 4 MG tablet   Oral   Take 1 tablet (4 mg total) by mouth daily.   90 tablet   1   . HYDROcodone-acetaminophen (NORCO/VICODIN) 5-325 MG tablet    Oral   Take 1 tablet by mouth every 4 (four) hours as needed for moderate pain.   15 tablet   0   . loperamide (IMODIUM) 2 MG capsule   Oral   Take 2 mg by mouth as needed for diarrhea or loose stools.         Marland Kitchen losartan (COZAAR) 50 MG tablet   Oral   Take 1 tablet (50 mg total) by mouth daily.   30 tablet   3   . nitroGLYCERIN (NITROSTAT) 0.4 MG SL tablet   Sublingual   Place 0.4 mg under the tongue every 5 (five) minutes as needed for chest pain. Reported on 02/20/2016         . norethindrone-ethinyl estradiol 1/35 (ORTHO-NOVUM, NORTREL,CYCLAFEM) tablet   Oral   Take 1 tablet by mouth daily.         . pantoprazole (PROTONIX) 40 MG tablet   Oral   Take 1 tablet (40 mg total) by mouth daily at 6 (six) AM.   90 tablet   1   . sulfamethoxazole-trimethoprim (BACTRIM DS,SEPTRA DS) 800-160 MG tablet   Oral   Take 1 tablet by mouth 2 (two) times daily.   20 tablet   0   . ticagrelor (BRILINTA) 90 MG TABS tablet   Oral   Take 1 tablet (90 mg total) by mouth 2 (two) times daily.   60 tablet   6     Allergies Lisinopril and Shellfish allergy  Family History  Problem Relation Age of Onset  . Diabetes Father   . Cancer Maternal Grandmother     lung  . Cancer Maternal Grandfather     prostate  . Diabetes Paternal Grandfather     Social History Social History  Substance Use Topics  . Smoking status: Former Smoker -- 15 years    Quit date: 03/10/2015  . Smokeless tobacco: Never Used  . Alcohol Use: No    Review of Systems  Constitutional: Negative for fever. Eyes: Negative for visual changes. ENT: As above  CV: No chest pain  Musculoskeletal: Negative for joint swelling Skin: Negative for rash. Neurological: Negative for headaches    ____________________________________________   PHYSICAL EXAM:  VITAL SIGNS: ED Triage Vitals  Enc Vitals Group     BP 02/24/16 0241 177/91 mmHg     Pulse Rate  02/24/16 0241 94     Resp 02/24/16 0241 18      Temp 02/24/16 0241 98.2 F (36.8 C)     Temp Source 02/24/16 0241 Oral     SpO2 02/24/16 0241 100 %     Weight 02/24/16 0241 190 lb (86.183 kg)     Height 02/24/16 0241 5\' 4"  (1.626 m)     Head Cir --      Peak Flow --      Pain Score 02/24/16 0242 0     Pain Loc --      Pain Edu? --      Excl. in [place]? --      Constitutional: Alert and oriented. Well appearing and in no distress. Eyes: Conjunctivae are normal.  ENT   Head: Normocephalic and atraumatic.   Mouth/Throat: Mucous membranes are moist. Pharynx is normal, no swelling, tongue is normal, no swelling. No stridor Ears: Otitis externa right ear, boil above ear is significantly improved Cardiovascular: Normal rate, regular rhythm.  Respiratory: Normal respiratory effort without tachypnea nor retractions.  G Musculoskeletal: Nontender with normal range of motion in all extremities. Neurologic:  Normal speech and language. No gross focal neurologic deficits are appreciated. Skin:  Skin is warm, dry and intact. No rash noted. Psychiatric: Mood and affect are normal. Patient exhibits appropriate insight and judgment.  ____________________________________________    LABS (pertinent positives/negatives)  Labs Reviewed - No data to display  ____________________________________________     ____________________________________________    RADIOLOGY I have personally reviewed any xrays that were ordered on this patient: None  ____________________________________________   PROCEDURES  Procedure(s) performed: none   ____________________________________________   INITIAL IMPRESSION / ASSESSMENT AND PLAN / ED COURSE  Pertinent labs & imaging results that were available during my care of the patient were reviewed by me and considered in my medical decision making (see chart for details).  Patient given by mouth Benadryl here although extremely low suspicion for allergic reaction. This seems to be more related  to anxiety. Patient is calm with benign exam and the emergency department. No evidence of anaphylaxis. She has been steadily improving during her time in the waiting room.  ____________________________________________   FINAL CLINICAL IMPRESSION(S) / ED DIAGNOSES  Final diagnoses:  Sore throat     Lavonia Drafts, MD 02/24/16 0730

## 2016-02-24 NOTE — Discharge Instructions (Signed)

## 2016-03-01 ENCOUNTER — Emergency Department: Payer: Medicaid Other

## 2016-03-01 ENCOUNTER — Observation Stay
Admission: EM | Admit: 2016-03-01 | Discharge: 2016-03-01 | Disposition: A | Discharge status: Home / Self Care | Payer: Medicaid Other | Attending: Internal Medicine | Admitting: Internal Medicine

## 2016-03-01 ENCOUNTER — Encounter: Payer: Self-pay | Admitting: Emergency Medicine

## 2016-03-01 ENCOUNTER — Observation Stay (HOSPITAL_BASED_OUTPATIENT_CLINIC_OR_DEPARTMENT_OTHER): Payer: Medicaid Other

## 2016-03-01 DIAGNOSIS — R079 Chest pain, unspecified: Secondary | ICD-10-CM

## 2016-03-01 DIAGNOSIS — I25110 Atherosclerotic heart disease of native coronary artery with unstable angina pectoris: Secondary | ICD-10-CM | POA: Insufficient documentation

## 2016-03-01 DIAGNOSIS — R000 Tachycardia, unspecified: Secondary | ICD-10-CM | POA: Diagnosis not present

## 2016-03-01 DIAGNOSIS — F419 Anxiety disorder, unspecified: Secondary | ICD-10-CM | POA: Insufficient documentation

## 2016-03-01 DIAGNOSIS — R0600 Dyspnea, unspecified: Secondary | ICD-10-CM | POA: Diagnosis not present

## 2016-03-01 DIAGNOSIS — I2510 Atherosclerotic heart disease of native coronary artery without angina pectoris: Secondary | ICD-10-CM | POA: Diagnosis not present

## 2016-03-01 DIAGNOSIS — Z7982 Long term (current) use of aspirin: Secondary | ICD-10-CM | POA: Insufficient documentation

## 2016-03-01 DIAGNOSIS — E669 Obesity, unspecified: Secondary | ICD-10-CM | POA: Insufficient documentation

## 2016-03-01 DIAGNOSIS — Z955 Presence of coronary angioplasty implant and graft: Secondary | ICD-10-CM | POA: Insufficient documentation

## 2016-03-01 DIAGNOSIS — Z6832 Body mass index (BMI) 32.0-32.9, adult: Secondary | ICD-10-CM | POA: Diagnosis not present

## 2016-03-01 DIAGNOSIS — N920 Excessive and frequent menstruation with regular cycle: Secondary | ICD-10-CM | POA: Diagnosis not present

## 2016-03-01 DIAGNOSIS — M79602 Pain in left arm: Secondary | ICD-10-CM | POA: Insufficient documentation

## 2016-03-01 DIAGNOSIS — R61 Generalized hyperhidrosis: Secondary | ICD-10-CM | POA: Insufficient documentation

## 2016-03-01 DIAGNOSIS — I10 Essential (primary) hypertension: Secondary | ICD-10-CM | POA: Insufficient documentation

## 2016-03-01 DIAGNOSIS — Z888 Allergy status to other drugs, medicaments and biological substances status: Secondary | ICD-10-CM | POA: Diagnosis not present

## 2016-03-01 DIAGNOSIS — I255 Ischemic cardiomyopathy: Secondary | ICD-10-CM | POA: Diagnosis not present

## 2016-03-01 DIAGNOSIS — R112 Nausea with vomiting, unspecified: Secondary | ICD-10-CM | POA: Diagnosis not present

## 2016-03-01 DIAGNOSIS — Z87891 Personal history of nicotine dependence: Secondary | ICD-10-CM | POA: Diagnosis not present

## 2016-03-01 DIAGNOSIS — I252 Old myocardial infarction: Secondary | ICD-10-CM | POA: Diagnosis not present

## 2016-03-01 DIAGNOSIS — Z9889 Other specified postprocedural states: Secondary | ICD-10-CM | POA: Diagnosis not present

## 2016-03-01 DIAGNOSIS — R51 Headache: Secondary | ICD-10-CM | POA: Diagnosis not present

## 2016-03-01 DIAGNOSIS — D509 Iron deficiency anemia, unspecified: Secondary | ICD-10-CM | POA: Diagnosis not present

## 2016-03-01 DIAGNOSIS — R0602 Shortness of breath: Secondary | ICD-10-CM | POA: Diagnosis not present

## 2016-03-01 DIAGNOSIS — I340 Nonrheumatic mitral (valve) insufficiency: Secondary | ICD-10-CM | POA: Diagnosis not present

## 2016-03-01 DIAGNOSIS — N179 Acute kidney failure, unspecified: Secondary | ICD-10-CM | POA: Insufficient documentation

## 2016-03-01 DIAGNOSIS — Z793 Long term (current) use of hormonal contraceptives: Secondary | ICD-10-CM | POA: Diagnosis not present

## 2016-03-01 DIAGNOSIS — E785 Hyperlipidemia, unspecified: Secondary | ICD-10-CM | POA: Insufficient documentation

## 2016-03-01 DIAGNOSIS — E119 Type 2 diabetes mellitus without complications: Secondary | ICD-10-CM | POA: Diagnosis not present

## 2016-03-01 DIAGNOSIS — R072 Precordial pain: Secondary | ICD-10-CM

## 2016-03-01 DIAGNOSIS — E871 Hypo-osmolality and hyponatremia: Secondary | ICD-10-CM | POA: Diagnosis not present

## 2016-03-01 DIAGNOSIS — I5022 Chronic systolic (congestive) heart failure: Secondary | ICD-10-CM | POA: Insufficient documentation

## 2016-03-01 DIAGNOSIS — R197 Diarrhea, unspecified: Secondary | ICD-10-CM | POA: Insufficient documentation

## 2016-03-01 DIAGNOSIS — Z79899 Other long term (current) drug therapy: Secondary | ICD-10-CM | POA: Diagnosis not present

## 2016-03-01 DIAGNOSIS — Z91013 Allergy to seafood: Secondary | ICD-10-CM | POA: Insufficient documentation

## 2016-03-01 DIAGNOSIS — D259 Leiomyoma of uterus, unspecified: Secondary | ICD-10-CM | POA: Diagnosis not present

## 2016-03-01 LAB — NM MYOCAR MULTI W/SPECT W/WALL MOTION / EF
Estimated workload: 1 METS
LV dias vol: 92 mL (ref 46–106)
LV sys vol: 48 mL
Peak HR: 116 {beats}/min
Percent HR: 65 %
Percent of predicted max HR: 63 %
Rest HR: 96 {beats}/min
SDS: 0
SRS: 5
SSS: 6
Stage 1 Grade: 0 %
Stage 1 HR: 94 {beats}/min
Stage 1 Speed: 0 mph
Stage 2 Grade: 0 %
Stage 2 HR: 96 {beats}/min
Stage 2 Speed: 0 mph
Stage 3 Grade: 0 %
Stage 3 HR: 96 {beats}/min
Stage 3 Speed: 0 mph
Stage 4 Grade: 0 %
Stage 4 HR: 116 {beats}/min
Stage 4 Speed: 0 mph
Stage 5 DBP: 47 mmHg
Stage 5 Grade: 0 %
Stage 5 HR: 108 {beats}/min
Stage 5 SBP: 106 mmHg
Stage 5 Speed: 0 mph
Stage 6 DBP: 50 mmHg
Stage 6 Grade: 0 %
Stage 6 HR: 105 {beats}/min
Stage 6 SBP: 107 mmHg
Stage 6 Speed: 0 mph
TID: 1.22

## 2016-03-01 LAB — PROTIME-INR
INR: 1.03
Prothrombin Time: 13.7 seconds (ref 11.4–15.0)

## 2016-03-01 LAB — BASIC METABOLIC PANEL
Anion gap: 6 (ref 5–15)
BUN: 30 mg/dL — ABNORMAL HIGH (ref 6–20)
CO2: 19 mmol/L — ABNORMAL LOW (ref 22–32)
Calcium: 8.6 mg/dL — ABNORMAL LOW (ref 8.9–10.3)
Chloride: 106 mmol/L (ref 101–111)
Creatinine, Ser: 1.46 mg/dL — ABNORMAL HIGH (ref 0.44–1.00)
GFR calc Af Amer: 52 mL/min — ABNORMAL LOW (ref >60)
GFR calc non Af Amer: 45 mL/min — ABNORMAL LOW (ref >60)
Glucose, Bld: 239 mg/dL — ABNORMAL HIGH (ref 65–99)
Potassium: 4.5 mmol/L (ref 3.5–5.1)
Sodium: 131 mmol/L — ABNORMAL LOW (ref 135–145)

## 2016-03-01 LAB — TROPONIN I
Troponin I: <0.03 ng/mL (ref <0.031)
Troponin I: <0.03 ng/mL (ref <0.031)
Troponin I: <0.03 ng/mL (ref <0.031)
Troponin I: <0.03 ng/mL (ref <0.031)

## 2016-03-01 LAB — CBC
HCT: 31.4 % — ABNORMAL LOW (ref 35.0–47.0)
Hemoglobin: 10.5 g/dL — ABNORMAL LOW (ref 12.0–16.0)
MCH: 26 pg (ref 26.0–34.0)
MCHC: 33.4 g/dL (ref 32.0–36.0)
MCV: 77.8 fL — ABNORMAL LOW (ref 80.0–100.0)
Platelets: 442 10*3/uL — ABNORMAL HIGH (ref 150–440)
RBC: 4.04 MIL/uL (ref 3.80–5.20)
RDW: 14.9 % — ABNORMAL HIGH (ref 11.5–14.5)
WBC: 5.6 10*3/uL (ref 3.6–11.0)

## 2016-03-01 LAB — FIBRIN DERIVATIVES D-DIMER (ARMC ONLY): Fibrin derivatives D-dimer (ARMC): 725 — ABNORMAL HIGH (ref 0–499)

## 2016-03-01 LAB — GLUCOSE, CAPILLARY
Glucose-Capillary: 215 mg/dL — ABNORMAL HIGH (ref 65–99)
Glucose-Capillary: 112 mg/dL — ABNORMAL HIGH (ref 65–99)

## 2016-03-01 LAB — HEMOGLOBIN A1C: Hgb A1c MFr Bld: 10 % — ABNORMAL HIGH (ref 4.0–6.0)

## 2016-03-01 LAB — APTT: aPTT: 26 seconds (ref 24–36)

## 2016-03-01 LAB — POCT PREGNANCY, URINE: Preg Test, Ur: NEGATIVE

## 2016-03-01 LAB — TSH: TSH: 4.154 u[IU]/mL (ref 0.350–4.500)

## 2016-03-01 MED ORDER — ACETAMINOPHEN 650 MG RE SUPP: 650.0000 mg | Freq: Four times a day (QID) | RECTAL | Status: DC | PRN

## 2016-03-01 MED ORDER — DOCUSATE SODIUM 100 MG PO CAPS
100.0000 mg | ORAL_CAPSULE | Freq: Two times a day (BID) | ORAL | Status: DC
  Administered 2016-03-01: 100 mg via ORAL
  Filled 2016-03-01: qty 1

## 2016-03-01 MED ORDER — INSULIN ASPART 100 UNIT/ML ~~LOC~~ SOLN: 0.0000 [IU] | Freq: Every day | SUBCUTANEOUS | Status: DC

## 2016-03-01 MED ORDER — ALUM & MAG HYDROXIDE-SIMETH 200-200-20 MG/5ML PO SUSP
30.0000 mL | Freq: Four times a day (QID) | ORAL | Status: DC | PRN
  Administered 2016-03-01: 30 mL via ORAL
  Filled 2016-03-01: qty 30

## 2016-03-01 MED ORDER — HEPARIN (PORCINE) IN NACL 100-0.45 UNIT/ML-% IJ SOLN: 12.0000 [IU]/kg/h | INTRAMUSCULAR | Status: DC

## 2016-03-01 MED ORDER — TECHNETIUM TC 99M SESTAMIBI - CARDIOLITE
33.1800 | Freq: Once | INTRAVENOUS | Status: AC | PRN
  Administered 2016-03-01: 12:00:00 33.18 via INTRAVENOUS

## 2016-03-01 MED ORDER — TECHNETIUM TC 99M SESTAMIBI - CARDIOLITE
14.1100 | Freq: Once | INTRAVENOUS | Status: AC | PRN
  Administered 2016-03-01: 10:00:00 14.11 via INTRAVENOUS

## 2016-03-01 MED ORDER — HEPARIN (PORCINE) IN NACL 100-0.45 UNIT/ML-% IJ SOLN
900.0000 [IU]/h | INTRAMUSCULAR | Status: DC
  Filled 2016-03-01: qty 250

## 2016-03-01 MED ORDER — ACETAMINOPHEN 325 MG PO TABS: 650.0000 mg | ORAL_TABLET | Freq: Four times a day (QID) | ORAL | Status: DC | PRN

## 2016-03-01 MED ORDER — ATORVASTATIN CALCIUM 20 MG PO TABS
20.0000 mg | ORAL_TABLET | Freq: Every day | ORAL | Status: DC
  Administered 2016-03-01: 20 mg via ORAL
  Filled 2016-03-01: qty 1

## 2016-03-01 MED ORDER — ONDANSETRON HCL 4 MG PO TABS: 4.0000 mg | ORAL_TABLET | Freq: Four times a day (QID) | ORAL | Status: DC | PRN

## 2016-03-01 MED ORDER — ONDANSETRON HCL 4 MG/2ML IJ SOLN
INTRAMUSCULAR | Status: AC
  Administered 2016-03-01: 4 mg via INTRAVENOUS
  Filled 2016-03-01: qty 2

## 2016-03-01 MED ORDER — IOPAMIDOL (ISOVUE-370) INJECTION 76%
100.0000 mL | Freq: Once | INTRAVENOUS | Status: AC | PRN
  Administered 2016-03-01: 100 mL via INTRAVENOUS

## 2016-03-01 MED ORDER — INSULIN ASPART 100 UNIT/ML ~~LOC~~ SOLN
0.0000 [IU] | Freq: Three times a day (TID) | SUBCUTANEOUS | Status: DC
  Administered 2016-03-01: 5 [IU] via SUBCUTANEOUS
  Filled 2016-03-01: qty 5

## 2016-03-01 MED ORDER — NITROGLYCERIN 2 % TD OINT
0.5000 [in_us] | TOPICAL_OINTMENT | Freq: Four times a day (QID) | TRANSDERMAL | Status: DC
  Administered 2016-03-01 (×2): 0.5 [in_us] via TOPICAL
  Filled 2016-03-01 (×2): qty 1

## 2016-03-01 MED ORDER — LOPERAMIDE HCL 2 MG PO CAPS: 2.0000 mg | ORAL_CAPSULE | ORAL | Status: DC | PRN

## 2016-03-01 MED ORDER — PANTOPRAZOLE SODIUM 40 MG PO TBEC
40.0000 mg | DELAYED_RELEASE_TABLET | Freq: Every day | ORAL | Status: DC
  Administered 2016-03-01: 40 mg via ORAL
  Filled 2016-03-01: qty 1

## 2016-03-01 MED ORDER — ASPIRIN 81 MG PO CHEW
CHEWABLE_TABLET | ORAL | Status: AC
  Administered 2016-03-01: 324 mg via ORAL
  Filled 2016-03-01: qty 4

## 2016-03-01 MED ORDER — NITROGLYCERIN 0.4 MG SL SUBL: 0.4000 mg | SUBLINGUAL_TABLET | SUBLINGUAL | Status: DC | PRN

## 2016-03-01 MED ORDER — LOSARTAN POTASSIUM 50 MG PO TABS
50.0000 mg | ORAL_TABLET | Freq: Every day | ORAL | Status: DC
Start: 2016-03-01 — End: 2016-03-01
  Administered 2016-03-01: 50 mg via ORAL
  Filled 2016-03-01: qty 1

## 2016-03-01 MED ORDER — ONDANSETRON HCL 4 MG/2ML IJ SOLN
4.0000 mg | Freq: Once | INTRAMUSCULAR | Status: AC
  Administered 2016-03-01: 4 mg via INTRAVENOUS

## 2016-03-01 MED ORDER — ASPIRIN EC 81 MG PO TBEC
81.0000 mg | DELAYED_RELEASE_TABLET | Freq: Every day | ORAL | Status: DC
Start: 2016-03-01 — End: 2016-03-01
  Administered 2016-03-01: 81 mg via ORAL
  Filled 2016-03-01: qty 1

## 2016-03-01 MED ORDER — LORAZEPAM 1 MG PO TABS: 1.0000 mg | ORAL_TABLET | Freq: Three times a day (TID) | ORAL | Status: DC | PRN

## 2016-03-01 MED ORDER — SODIUM CHLORIDE 0.9% FLUSH
3.0000 mL | Freq: Two times a day (BID) | INTRAVENOUS | Status: DC
  Administered 2016-03-01: 3 mL via INTRAVENOUS

## 2016-03-01 MED ORDER — ASPIRIN 81 MG PO CHEW
324.0000 mg | CHEWABLE_TABLET | Freq: Once | ORAL | Status: AC
  Administered 2016-03-01: 324 mg via ORAL

## 2016-03-01 MED ORDER — TICAGRELOR 90 MG PO TABS
90.0000 mg | ORAL_TABLET | Freq: Two times a day (BID) | ORAL | Status: DC
  Administered 2016-03-01: 90 mg via ORAL
  Filled 2016-03-01: qty 1

## 2016-03-01 MED ORDER — SODIUM CHLORIDE 0.9 % IV SOLN
INTRAVENOUS | Status: DC
  Administered 2016-03-01: 07:00:00 via INTRAVENOUS

## 2016-03-01 MED ORDER — CARVEDILOL 3.125 MG PO TABS: 6.2500 mg | ORAL_TABLET | Freq: Two times a day (BID) | ORAL | Status: DC

## 2016-03-01 MED ORDER — REGADENOSON 0.4 MG/5ML IV SOLN
0.4000 mg | Freq: Once | INTRAVENOUS | Status: AC
  Administered 2016-03-01: 0.4 mg via INTRAVENOUS

## 2016-03-01 MED ORDER — LORAZEPAM 1 MG PO TABS
1.0000 mg | ORAL_TABLET | Freq: Four times a day (QID) | ORAL | Status: DC | PRN
  Administered 2016-03-01: 1 mg via ORAL
  Filled 2016-03-01: qty 1

## 2016-03-01 MED ORDER — HEPARIN BOLUS VIA INFUSION
4000.0000 [IU] | Freq: Once | INTRAVENOUS | Status: DC
  Filled 2016-03-01: qty 4000

## 2016-03-01 MED ORDER — ONDANSETRON HCL 4 MG/2ML IJ SOLN: 4.0000 mg | Freq: Four times a day (QID) | INTRAMUSCULAR | Status: DC | PRN

## 2016-03-01 MED ORDER — MORPHINE SULFATE (PF) 2 MG/ML IV SOLN: 2.0000 mg | INTRAVENOUS | Status: DC | PRN

## 2016-03-01 MED ORDER — CARVEDILOL 6.25 MG PO TABS
6.2500 mg | ORAL_TABLET | Freq: Two times a day (BID) | ORAL | Status: DC
  Administered 2016-03-01: 6.25 mg via ORAL
  Filled 2016-03-01: qty 1

## 2016-03-01 NOTE — Progress Notes (Signed)
ANTICOAGULATION CONSULT NOTE - Initial Consult  Pharmacy Consult for heparin drip Indication: chest pain  Allergies  Allergen Reactions  . Lisinopril Itching and Swelling  . Shellfish Allergy Itching and Swelling    Patient Measurements: Height: 5\' 4"  (162.6 cm) Weight: 190 lb (86.183 kg) IBW/kg (Calculated) : 54.7 Heparin Dosing Weight: 73kg  Vital Signs: Temp: 98 F (36.7 C) (03/24 0048) Temp Source: Oral (03/24 0048) BP: 142/85 mmHg (03/24 0500) Pulse Rate: 98 (03/24 0500)  Labs:  Recent Labs  03/01/16 0053 03/01/16 0429  HGB 10.5*  --   HCT 31.4*  --   PLT 442*  --   CREATININE 1.46*  --   TROPONINI <0.03 <0.03    Estimated Creatinine Clearance: 56.6 mL/min (by C-G formula based on Cr of 1.46).   Medical History: Past Medical History  Diagnosis Date  . CAD (coronary artery disease)     a. NSTEMI 03/2015: mLCx 100% s/p PCI/DES, RI 90% s/p PCI/DES, EF 35%; b. 04/2015 Relook Cath: LM nl, LAD 40p, RI patent stent, LCX patent stent, RCA nl, EF 55-65%.  . Chronic systolic CHF (congestive heart failure) ([place])     a. echo 03/2015: EF 30-35%, mild concentric LVH, severe HK of inf, inflat, & lat walls, mod MR, mild TR;  b. 04/2015 LV Gram: EF 55-65%.  . Ischemic cardiomyopathy     a. 03/2015 EF 30-35% post NSTEMI;  b. 04/2015 EF 55-65% on LV gram.  . DM2 (diabetes mellitus, type 2) ([place])     a. since 2002  . Tobacco abuse     a. quit 03/2015.  . Obesity   . Hyperlipidemia   . Hypotension     a. resulting in discontinuation of losartan and reduction in coreg dose.  Marland Kitchen Heavy menses     a. H/O IUD - expired in 2014 - remains in place.  . Iron deficiency anemia   . Kidney disease   . Uterine fibroid   . Hypertension   . MI (myocardial infarction) ([place]) 03/29/2015  . Renal insufficiency     Medications:    Assessment:  APTT and INR pending Not on anticoag as outpatient per med rec.  Goal of Therapy:  Heparin level 0.3-0.7 units/ml Monitor platelets by  anticoagulation protocol: Yes   Plan:  4000 unit bolus and initial rate of 900 units/hr. First anti-Xa 6 hours after start of infusion.  Ahaan Zobrist S 03/01/2016,5:50 AM

## 2016-03-01 NOTE — ED Notes (Signed)
Pt reports CP starting this morning, got worse at night, left sided pressure, radiating to back and left arm.  Pt reports SOB and n/v x 4.  Pt reports hx MI last year but no chest pain with MI.  Pt reports she took nitro earlier w/o relief.  Pt NAD at this time, respirations equal and unlabored, skin warm and dry

## 2016-03-01 NOTE — ED Provider Notes (Signed)
[facility] Emergency Department Provider Note  ____________________________________________  Time seen: 12:45 AM  I have reviewed the triage vital signs and the nursing notes.   HISTORY  Chief Complaint Chest Pain     HPI [name] [name] is a 35 y.o. female with history of myocardial infarction April 2016 hypertension, hyperlipidemia diabetes ischemic cardiomyopathy presents with left chest pressure or radiation to the left arm accompanied by nausea with onset tonight. Patient states that the pain has been persistent since onset pain at present 7 out of 10. Patient also admits to some dyspnea. Patient denies any lower extremity pain or swelling. Patient states that she took 2 nitroglycerin before presentation to emergency department without relief. Patient denies any aggravating or alleviating factors.     Past Medical History  Diagnosis Date  . CAD (coronary artery disease)     a. NSTEMI 03/2015: mLCx 100% s/p PCI/DES, RI 90% s/p PCI/DES, EF 35%; b. 04/2015 Relook Cath: LM nl, LAD 40p, RI patent stent, LCX patent stent, RCA nl, EF 55-65%.  . Chronic systolic CHF (congestive heart failure) ([place])     a. echo 03/2015: EF 30-35%, mild concentric LVH, severe HK of inf, inflat, & lat walls, mod MR, mild TR;  b. 04/2015 LV Gram: EF 55-65%.  . Ischemic cardiomyopathy     a. 03/2015 EF 30-35% post NSTEMI;  b. 04/2015 EF 55-65% on LV gram.  . DM2 (diabetes mellitus, type 2) ([place])     a. since 2002  . Tobacco abuse     a. quit 03/2015.  . Obesity   . Hyperlipidemia   . Hypotension     a. resulting in discontinuation of losartan and reduction in coreg dose.  Marland Kitchen Heavy menses     a. H/O IUD - expired in 2014 - remains in place.  . Iron deficiency anemia   . Kidney disease   . Uterine fibroid   . Hypertension   . MI (myocardial infarction) ([place]) 03/29/2015  . Renal insufficiency     Patient Active Problem List   Diagnosis Date Noted  . IBS (irritable bowel syndrome)  12/15/2015  . Proteinuria 08/30/2015  . Symptomatic anemia   . Coronary artery disease involving native coronary artery of native heart with angina pectoris with documented spasm ([place])   . Angina pectoris ([place])   . Iron deficiency anemia   . Heavy menses   . SOB (shortness of breath) 08/21/2015  . Chest pain 05/05/2015  . CAD in native artery 05/05/2015  . Pain in the chest   . Hyperlipidemia 04/13/2015  . CAD (coronary artery disease)   . Chronic systolic CHF (congestive heart failure) ([place])   . Ischemic cardiomyopathy   . DM2 (diabetes mellitus, type 2) ([place])   . Tobacco abuse   . Obesity     Past Surgical History  Procedure Laterality Date  . Mouth surgery    . Cardiac catheterization  4/16    x2 stent [facility]  . Cardiac catheterization N/A 05/05/2015    Procedure: Left Heart Cath and Coronary Angiography;  Surgeon: Peter M Martinique, MD;  Location: [facility];  Service: Cardiovascular;  Laterality: N/A;    Current Outpatient Rx  Name  Route  Sig  Dispense  Refill  . aspirin EC 81 MG tablet   Oral   Take 81 mg by mouth daily.         Marland Kitchen atorvastatin (LIPITOR) 20 MG tablet   Oral   Take 1 tablet (20 mg  total) by mouth daily.   30 tablet   6   . carvedilol (COREG) 3.125 MG tablet   Oral   Take 1 tablet (3.125 mg total) by mouth 2 (two) times daily. Patient taking differently: Take 6.25 mg by mouth 2 (two) times daily.    60 tablet   3     Do not fill now. Pt will call when ready   . glimepiride (AMARYL) 4 MG tablet   Oral   Take 1 tablet (4 mg total) by mouth daily.   90 tablet   1   . loperamide (IMODIUM) 2 MG capsule   Oral   Take 2 mg by mouth as needed for diarrhea or loose stools.         Marland Kitchen losartan (COZAAR) 50 MG tablet   Oral   Take 1 tablet (50 mg total) by mouth daily.   30 tablet   3   . nitroGLYCERIN (NITROSTAT) 0.4 MG SL tablet   Sublingual   Place 0.4 mg under the tongue every 5 (five) minutes as needed for chest pain. Reported on  02/20/2016         . norethindrone-ethinyl estradiol 1/35 (ORTHO-NOVUM, NORTREL,CYCLAFEM) tablet   Oral   Take 1 tablet by mouth daily.         . pantoprazole (PROTONIX) 40 MG tablet   Oral   Take 1 tablet (40 mg total) by mouth daily at 6 (six) AM.   90 tablet   1   . ticagrelor (BRILINTA) 90 MG TABS tablet   Oral   Take 1 tablet (90 mg total) by mouth 2 (two) times daily.   60 tablet   6   . ciprofloxacin-dexamethasone (CIPRODEX) otic suspension   Right Ear   Place 4 drops into the right ear 2 (two) times daily. For the next 7 days   7.5 mL   0   . ferrous sulfate 325 (65 FE) MG tablet   Oral   Take 1 tablet (325 mg total) by mouth 3 (three) times daily with meals.   90 tablet   3   . HYDROcodone-acetaminophen (NORCO/VICODIN) 5-325 MG tablet   Oral   Take 1 tablet by mouth every 4 (four) hours as needed for moderate pain.   15 tablet   0     Allergies Lisinopril and Shellfish allergy  Family History  Problem Relation Age of Onset  . Diabetes Father   . Cancer Maternal Grandmother     lung  . Cancer Maternal Grandfather     prostate  . Diabetes Paternal Grandfather     Social History Social History  Substance Use Topics  . Smoking status: Former Smoker -- 15 years    Quit date: 03/10/2015  . Smokeless tobacco: Never Used  . Alcohol Use: No    Review of Systems  Constitutional: Negative for fever. Eyes: Negative for visual changes. ENT: Negative for sore throat. Cardiovascular: Positive for chest pain. Respiratory: Negative for shortness of breath. Gastrointestinal: Negative for abdominal pain, vomiting and diarrhea. Genitourinary: Negative for dysuria. Musculoskeletal: Negative for back pain. Skin: Negative for rash. Neurological: Negative for headaches, focal weakness or numbness.   10-point ROS otherwise negative.  ____________________________________________   PHYSICAL EXAM:  VITAL SIGNS: ED Triage Vitals  Enc Vitals Group      BP 03/01/16 0048 146/83 mmHg     Pulse Rate 03/01/16 0048 105     Resp 03/01/16 0048 18     Temp 03/01/16 0048 98 F (  36.7 C)     Temp Source 03/01/16 0048 Oral     SpO2 03/01/16 0048 100 %     Weight 03/01/16 0048 190 lb (86.183 kg)     Height 03/01/16 0048 5\' 4"  (1.626 m)     Head Cir --      Peak Flow --      Pain Score 03/01/16 0038 3     Pain Loc --      Pain Edu? --      Excl. in [place]? --      Constitutional: Alert and oriented. Well appearing and in no distress. Eyes: Conjunctivae are normal. PERRL. Normal extraocular movements. ENT   Head: Normocephalic and atraumatic.   Nose: No congestion/rhinnorhea.   Mouth/Throat: Mucous membranes are moist.   Neck: No stridor. Hematological/Lymphatic/Immunilogical: No cervical lymphadenopathy. Cardiovascular: Normal rate, regular rhythm. Normal and symmetric distal pulses are present in all extremities. No murmurs, rubs, or gallops. Respiratory: Normal respiratory effort without tachypnea nor retractions. Breath sounds are clear and equal bilaterally. No wheezes/rales/rhonchi. Gastrointestinal: Soft and nontender. No distention. There is no CVA tenderness. Genitourinary: deferred Musculoskeletal: Nontender with normal range of motion in all extremities. No joint effusions.  No lower extremity tenderness nor edema. Neurologic:  Normal speech and language. No gross focal neurologic deficits are appreciated. Speech is normal.  Skin:  Skin is warm, dry and intact. No rash noted. Psychiatric: Mood and affect are normal. Speech and behavior are normal. Patient exhibits appropriate insight and judgment.  ____________________________________________    LABS (pertinent positives/negatives)  Labs Reviewed  BASIC METABOLIC PANEL - Abnormal; Notable for the following:    Sodium 131 (*)    CO2 19 (*)    Glucose, Bld 239 (*)    BUN 30 (*)    Creatinine, Ser 1.46 (*)    Calcium 8.6 (*)    GFR calc non Af Amer 45 (*)    GFR  calc Af Amer 52 (*)    All other components within normal limits  CBC - Abnormal; Notable for the following:    Hemoglobin 10.5 (*)    HCT 31.4 (*)    MCV 77.8 (*)    RDW 14.9 (*)    Platelets 442 (*)    All other components within normal limits  FIBRIN DERIVATIVES D-DIMER ([facility] ONLY) - Abnormal; Notable for the following:    Fibrin derivatives D-dimer ([facility]) 725 (*)    All other components within normal limits  TROPONIN I  TROPONIN I  POCT PREGNANCY, URINE     ____________________________________________   EKG  ED ECG REPORT I, [place] Shaneeka Scarboro, the attending physician, personally viewed and interpreted this ECG.   Date: 03/01/2016  EKG Time: 12:48 AM  Rate: 103  Rhythm: Sinus Tachycardia  Axis: None  Intervals: Normal  ST&T Change: None   ____________________________________________    RADIOLOGY  CT Angio Chest PE W/Cm &/Or Wo Cm (Final result) Result time: 03/01/16 04:23:05   Final result by Rad Results In Interface (03/01/16 04:23:05)   Narrative:   CLINICAL DATA: 35 year old female with chest pain, dyspnea, and tachycardia.  EXAM: CT ANGIOGRAPHY CHEST WITH CONTRAST  TECHNIQUE: Multidetector CT imaging of the chest was performed using the standard protocol during bolus administration of intravenous contrast. Multiplanar CT image reconstructions and MIPs were obtained to evaluate the vascular anatomy.  CONTRAST: 100 cc Isovue 370  COMPARISON: Chest radiograph dated 03/01/2016  FINDINGS: The lungs are clear. There is no pleural effusion or pneumothorax. The central airways are  patent.  The thoracic aorta appears unremarkable. There is no CT evidence of pulmonary embolism.  No cardiomegaly or pericardial effusion. There is multiple cell coronary vascular calcification, advanced for patient's age. Correlation with EKG and cardiac calcium scoring and cardiology consult is advised. There is no hilar or mediastinal adenopathy. The esophagus and  thyroid gland appear unremarkable.  There is no axillary adenopathy. The chest wall soft tissues appear unremarkable. The osseous structures are intact.  The visualized upper abdomen appears unremarkable.  Review of the MIP images confirms the above findings.  IMPRESSION: No CT evidence of pulmonary embolism.  Multi vessel coronary vascular calcification advanced for the patient's age. Cardiology consult is advised.   Electronically Signed By: Anner Crete M.D. On: 03/01/2016 04:23          DG Chest Portable 1 View (Final result) Result time: 03/01/16 01:20:46   Final result by Rad Results In Interface (03/01/16 01:20:46)   Narrative:   CLINICAL DATA: Chest pain starting this morning. Left-sided pressure. Radiates to the back and left arm. Shortness of breath with nausea and vomiting.  EXAM: PORTABLE CHEST 1 VIEW  COMPARISON: 02/13/2016  FINDINGS: The heart size and mediastinal contours are within normal limits. Both lungs are clear. The visualized skeletal structures are unremarkable.  IMPRESSION: No active disease.   Electronically Signed By: Lucienne Capers M.D. On: 03/01/2016 01:20         INITIAL IMPRESSION / ASSESSMENT AND PLAN / ED COURSE  Pertinent labs & imaging results that were available during my care of the patient were reviewed by me and considered in my medical decision making (see chart for details).  Concern for possible cardiac etiology for the patient's chest pain troponin negative times one EKG revealing no evidence of myocardial infarction at this time. CT scan of the chest revealed no PE however diffuse cardiac vascular calcifications. Given multiple risk factors for cardiac disease MI hypertension hyperlipidemia ischemic cardiomyopathy patient will be admitted to the hospitalist for further evaluation and management.  ____________________________________________   FINAL CLINICAL IMPRESSION(S) / ED  DIAGNOSES  Final diagnoses:  Chest pain, unspecified chest pain type          Gregor Hams, MD 03/01/16 [phone]

## 2016-03-01 NOTE — Consult Note (Signed)
Cardiology Consultation Note  Patient ID: [name] [name], MRN: [id number], DOB/AGE: 01-01-1981 35 y.o. Admit date: 03/01/2016   Date of Consult: 03/01/2016 Primary Physician: Kathrine Haddock, NP Primary Cardiologist: Dr. Fletcher Anon, MD  Chief Complaint: Chest pain Reason for Consult: Unstable angina  HPI: 35 y.o. female with h/o CAD s/p NSTEMI 03/2014 s/p PCI/DES to LCx and OM, ischemic cardiomyopathy, type 2 diabetes since 2002, prolonged history of tobacco use, and obesity who presented to [facility] overnight with chest pain. She presented to [facility] in April of 2016 with NSTEMI. Echocardiogram showed an ejection fraction of 30% with severe inferior, inferolateral and lateral wall hypokinesis with moderate mitral regurgitation. Cardiac catheterization showed occluded mid left circumflex with significant disease affecting the proximal OM1. There was mild disease affecting the LAD and minor irregularities in the RCA. Ejection fraction was 35%. She underwent successful angioplasty and drug-eluting stent placement to the mid LCX and proximal OM. She underwent a repeat cardiac catheterization in May 2016 which showed patent stents in the left circumflex and OM1. Ejection fraction improved to 50-55%. She was hospitalized in September with symptomatic anemia due to heavy periods. This required transfusion. She was diagnosed with uterine fibroid and was placed on birth control pills with significant improvement in bleeding. At her last follow up in mid February 2017 she had been doing very well and denied any chest pain or shortness of breath. She had been taking all her medications. In late February she noted some elevated BP's, this was followed by some viral infections and ED visits. She presented to [facility] on 3/23 with nonexertional chest pain. Upon her arrival she was found to have negative troponin x 2 to date, down trending hgb from her baseline at 10.5, and elevated d-dimer at 725, elevated SCr at 1.46, elevated BUN at  30, hyponatremia 131. CTA chest was negative for PE. Multivessel coronary vascular calcification was noted and cardiology consult was advised. She was given a heparin bolus and heparin gtt was ordered, though this was discontinued this morning upon cardiology seeing her as her troponin was negative. EKG showed sinus tachycardia, 103 bpm, nonspecific st/t changes. Her chest pain persisted until she received a nitro patch. Her pain was associated with nausea, vomiting, and diaphoresis. SL NTG x 2 did not help. She is currently without chest pain.    Past Medical History  Diagnosis Date  . CAD (coronary artery disease)     a. NSTEMI 03/2015: mLCx 100% s/p PCI/DES, RI 90% s/p PCI/DES, EF 35%; b. 04/2015 Relook Cath: LM nl, LAD 40p, RI patent stent, LCX patent stent, RCA nl, EF 55-65%.  . Chronic systolic CHF (congestive heart failure) ([place])     a. echo 03/2015: EF 30-35%, mild concentric LVH, severe HK of inf, inflat, & lat walls, mod MR, mild TR;  b. 04/2015 LV Gram: EF 55-65%.  . Ischemic cardiomyopathy     a. 03/2015 EF 30-35% post NSTEMI;  b. 04/2015 EF 55-65% on LV gram.  . DM2 (diabetes mellitus, type 2) ([place])     a. since 2002  . Tobacco abuse     a. quit 03/2015.  . Obesity   . Hyperlipidemia   . Hypotension     a. resulting in discontinuation of losartan and reduction in coreg dose.  Marland Kitchen Heavy menses     a. H/O IUD - expired in 2014 - remains in place.  . Iron deficiency anemia   . Kidney disease   . Uterine fibroid   . Hypertension   .  MI (myocardial infarction) ([place]) 03/29/2015  . Renal insufficiency       Most Recent Cardiac Studies: As above   Surgical History:  Past Surgical History  Procedure Laterality Date  . Mouth surgery    . Cardiac catheterization  4/16    x2 stent [facility]  . Cardiac catheterization N/A 05/05/2015    Procedure: Left Heart Cath and Coronary Angiography;  Surgeon: Peter M Martinique, MD;  Location: [place] [facility];  Service: Cardiovascular;  Laterality: N/A;       Home Meds: Prior to Admission medications   Medication Sig Start Date End Date Taking? Authorizing Provider  aspirin EC 81 MG tablet Take 81 mg by mouth daily.   Yes Historical Provider, MD  atorvastatin (LIPITOR) 20 MG tablet Take 1 tablet (20 mg total) by mouth daily. 01/22/16  Yes Wellington Hampshire, MD  carvedilol (COREG) 3.125 MG tablet Take 1 tablet (3.125 mg total) by mouth 2 (two) times daily. Patient taking differently: Take 6.25 mg by mouth 2 (two) times daily.  10/23/15  Yes Wellington Hampshire, MD  glimepiride (AMARYL) 4 MG tablet Take 1 tablet (4 mg total) by mouth daily. 09/25/15  Yes Kathrine Haddock, NP  loperamide (IMODIUM) 2 MG capsule Take 2 mg by mouth as needed for diarrhea or loose stools.   Yes Historical Provider, MD  losartan (COZAAR) 50 MG tablet Take 1 tablet (50 mg total) by mouth daily. 10/23/15  Yes Wellington Hampshire, MD  nitroGLYCERIN (NITROSTAT) 0.4 MG SL tablet Place 0.4 mg under the tongue every 5 (five) minutes as needed for chest pain. Reported on 02/20/2016   Yes Historical Provider, MD  norethindrone-ethinyl estradiol 1/35 (ORTHO-NOVUM, NORTREL,CYCLAFEM) tablet Take 1 tablet by mouth daily.   Yes Historical Provider, MD  pantoprazole (PROTONIX) 40 MG tablet Take 1 tablet (40 mg total) by mouth daily at 6 (six) AM. 09/25/15  Yes Kathrine Haddock, NP  ticagrelor (BRILINTA) 90 MG TABS tablet Take 1 tablet (90 mg total) by mouth 2 (two) times daily. 09/09/15  Yes Isaiah Serge, NP  ciprofloxacin-dexamethasone (CIPRODEX) otic suspension Place 4 drops into the right ear 2 (two) times daily. For the next 7 days 02/17/16   Harvest Dark, MD  ferrous sulfate 325 (65 FE) MG tablet Take 1 tablet (325 mg total) by mouth 3 (three) times daily with meals. 08/22/15   Vaughan Basta, MD  HYDROcodone-acetaminophen (NORCO/VICODIN) 5-325 MG tablet Take 1 tablet by mouth every 4 (four) hours as needed for moderate pain. 02/17/16   Harvest Dark, MD    Inpatient Medications:   . aspirin EC  81 mg Oral Daily  . atorvastatin  20 mg Oral Daily  . carvedilol  6.25 mg Oral BID  . docusate sodium  100 mg Oral BID  . heparin  4,000 Units Intravenous Once  . insulin aspart  0-15 Units Subcutaneous TID WC  . insulin aspart  0-5 Units Subcutaneous QHS  . losartan  50 mg Oral Daily  . nitroGLYCERIN  0.5 inch Topical 4 times per day  . pantoprazole  40 mg Oral Q0600  . sodium chloride flush  3 mL Intravenous Q12H  . ticagrelor  90 mg Oral BID   . sodium chloride 125 mL/hr at 03/01/16 0640    Allergies:  Allergies  Allergen Reactions  . Lisinopril Itching and Swelling  . Shellfish Allergy Itching and Swelling    Social History   Social History  . Marital Status: Single    Spouse Name: N/A  .  Number of Children: N/A  . Years of Education: N/A   Occupational History  . Not on file.   Social History Main Topics  . Smoking status: Former Smoker -- 15 years    Quit date: 03/10/2015  . Smokeless tobacco: Never Used  . Alcohol Use: No  . Drug Use: No  . Sexual Activity: Yes    Birth Control/ Protection: Pill   Other Topics Concern  . Not on file   Social History Narrative   Lives locally with daughter and fiance.     Family History  Problem Relation Age of Onset  . Diabetes Father   . Cancer Maternal Grandmother     lung  . Cancer Maternal Grandfather     prostate  . Diabetes Paternal Grandfather      Review of Systems: Review of Systems  Constitutional: Positive for malaise/fatigue and diaphoresis. Negative for fever, chills and weight loss.  HENT: Negative for congestion.   Eyes: Negative for discharge and redness.  Respiratory: Positive for shortness of breath. Negative for cough, hemoptysis, sputum production and wheezing.   Cardiovascular: Positive for chest pain and leg swelling. Negative for palpitations, orthopnea, claudication and PND.  Gastrointestinal: Positive for nausea and vomiting. Negative for heartburn and abdominal pain.   Musculoskeletal: Negative for myalgias and falls.  Skin: Negative for rash.  Neurological: Positive for weakness. Negative for dizziness, tingling, tremors, sensory change, speech change, focal weakness and loss of consciousness.  Endo/Heme/Allergies: Does not bruise/bleed easily.  Psychiatric/Behavioral: Negative for substance abuse. The patient is not nervous/anxious.   All other systems reviewed and are negative.   Labs:  Recent Labs  03/01/16 0053 03/01/16 0429  TROPONINI <0.03 <0.03   Lab Results  Component Value Date   WBC 5.6 03/01/2016   HGB 10.5* 03/01/2016   HCT 31.4* 03/01/2016   MCV 77.8* 03/01/2016   PLT 442* 03/01/2016    Recent Labs Lab 03/01/16 0053  NA 131*  K 4.5  CL 106  CO2 19*  BUN 30*  CREATININE 1.46*  CALCIUM 8.6*  GLUCOSE 239*   Lab Results  Component Value Date   CHOL 93* 05/16/2015   HDL 32* 05/16/2015   LDLCALC 46 05/16/2015   TRIG 73 05/16/2015   No results found for: DDIMER  Radiology/Studies:  Dg Chest 2 View  02/13/2016  CLINICAL DATA:  Sudden onset shortness of breath with diaphoresis and headache. EXAM: CHEST  2 VIEW COMPARISON:  08/21/2015 FINDINGS: The heart size and mediastinal contours are within normal limits. Both lungs are clear. The visualized skeletal structures are unremarkable. IMPRESSION: No active cardiopulmonary disease. Electronically Signed   By: Lucienne Capers M.D.   On: 02/13/2016 01:14   Ct Angio Chest Pe W/cm &/or Wo Cm  03/01/2016  CLINICAL DATA:  35 year old female with chest pain, dyspnea, and tachycardia. EXAM: CT ANGIOGRAPHY CHEST WITH CONTRAST TECHNIQUE: Multidetector CT imaging of the chest was performed using the standard protocol during bolus administration of intravenous contrast. Multiplanar CT image reconstructions and MIPs were obtained to evaluate the vascular anatomy. CONTRAST:  100 cc Isovue 370 COMPARISON:  Chest radiograph dated 03/01/2016 FINDINGS: The lungs are clear. There is no pleural  effusion or pneumothorax. The central airways are patent. The thoracic aorta appears unremarkable. There is no CT evidence of pulmonary embolism. No cardiomegaly or pericardial effusion. There is multiple cell coronary vascular calcification, advanced for patient's age. Correlation with EKG and cardiac calcium scoring and cardiology consult is advised. There is no hilar or mediastinal adenopathy.  The esophagus and thyroid gland appear unremarkable. There is no axillary adenopathy. The chest wall soft tissues appear unremarkable. The osseous structures are intact. The visualized upper abdomen appears unremarkable. Review of the MIP images confirms the above findings. IMPRESSION: No CT evidence of pulmonary embolism. Multi vessel coronary vascular calcification advanced for the patient's age. Cardiology consult is advised. Electronically Signed   By: Anner Crete M.D.   On: 03/01/2016 04:23   Dg Chest Portable 1 View  03/01/2016  CLINICAL DATA:  Chest pain starting this morning. Left-sided pressure. Radiates to the back and left arm. Shortness of breath with nausea and vomiting. EXAM: PORTABLE CHEST 1 VIEW COMPARISON:  02/13/2016 FINDINGS: The heart size and mediastinal contours are within normal limits. Both lungs are clear. The visualized skeletal structures are unremarkable. IMPRESSION: No active disease. Electronically Signed   By: Lucienne Capers M.D.   On: 03/01/2016 01:20    EKG: sinus tachycardia, 103 bpm, nonspecific st/t changes  Weights: Filed Weights   03/01/16 0048  Weight: 190 lb (86.183 kg)     Physical Exam: Blood pressure 128/67, pulse 103, temperature 98.4 F (36.9 C), temperature source Oral, resp. rate 18, height 5\' 4"  (1.626 m), weight 190 lb (86.183 kg), last menstrual period 01/09/2016, SpO2 100 %. Body mass index is 32.6 kg/(m^2). General: Well developed, well nourished, in no acute distress. Head: Normocephalic, atraumatic, sclera non-icteric, no xanthomas, nares are  without discharge.  Neck: Negative for carotid bruits. JVD not elevated. Lungs: Clear bilaterally to auscultation without wheezes, rales, or rhonchi. Breathing is unlabored. Heart: RRR with S1 S2. I/VI systolic murmurs, no rubs, or gallops appreciated. Abdomen: Obese, soft, non-tender, non-distended with normoactive bowel sounds. No hepatomegaly. No rebound/guarding. No obvious abdominal masses. Msk:  Strength and tone appear normal for age. Extremities: No clubbing or cyanosis. No edema.  Distal pedal pulses are 2+ and equal bilaterally. Neuro: Alert and oriented X 3. No facial asymmetry. No focal deficit. Moves all extremities spontaneously. Psych:  Responds to questions appropriately with a normal affect.    Assessment and Plan:   1. Unstable angina/CAD s/p PCI as above: -Currently chest pain free -Troponin negative x 2 to date -Lexiscan Myoview to evaluate for high risk ischemia -Echo -Aspirin 81 mg and Brilinta 90 mg bid -Coreg 6.25 mg bid and Lipitor 20 mg  2. Ischemic cardiomyopathy: -Last known EF had improved from 25% to 55% -Not currently volume overloaded -Continue current medications  3. Anemia: -Stable  4. DM2: -Per IM  5. HTN: -Continue current medications  6. HLD: -Lipitor  Signed, Christell Faith, PA-C Pager: [phone] 03/01/2016, 7:42 AM

## 2016-03-01 NOTE — Progress Notes (Signed)
Inpatient Diabetes Program Recommendations  AACE/ADA: New Consensus Statement on Inpatient Glycemic Control (2015)  Target Ranges:  Prepandial:   less than 140 mg/dL      Peak postprandial:   less than 180 mg/dL (1-2 hours)      Critically ill patients:  140 - 180 mg/dL   Review of Glycemic Control  Results for CELA, MCGILBERRY (MRN [id number]) as of 03/01/2016 13:22  Ref. Range 03/01/2016 07:37 03/01/2016 12:45  Glucose-Capillary Latest Ref Range: 65-99 mg/dL 215 (H) 112 (H)    Diabetes history: Type 2 Outpatient Diabetes medications: Amaryl 4mg /day Current orders for Inpatient glycemic control: Novolog 0-15 units tid, Novolog 0-5 units qhs  A1C on 05/05/15=14.2%,       08/30/15=6.8%       Pending this admission  Inpatient Diabetes Program Recommendations: Agree with current recommendations.  If fasting blood sugars remains elevated tomorrow, consider staring low dose basal insulin- Lantus 9 units qhs  [name], RN, [place], , CDE Diabetes Coordinator Inpatient Diabetes Program  [phone] (Team Pager) [phone] ([place]) 03/01/2016 1:25 PM

## 2016-03-01 NOTE — Discharge Summary (Signed)
[facility] NAME: [name] [name]    MR#:  [id number]  DATE OF BIRTH:  [birth year]  DATE OF ADMISSION:  03/01/2016 ADMITTING PHYSICIAN: Harrie Foreman, MD  DATE OF DISCHARGE: 03/01/2016  PRIMARY CARE PHYSICIAN: Kathrine Haddock, NP    ADMISSION DIAGNOSIS:  Chest pain, unspecified chest pain type [R07.9]  DISCHARGE DIAGNOSIS:  Active Problems:   Chest pain   SECONDARY DIAGNOSIS:   Past Medical History  Diagnosis Date  . CAD (coronary artery disease)     a. NSTEMI 03/2015: mLCx 100% s/p PCI/DES, RI 90% s/p PCI/DES, EF 35%; b. 04/2015 Relook Cath: LM nl, LAD 40p, RI patent stent, LCX patent stent, RCA nl, EF 55-65%.  . Chronic systolic CHF (congestive heart failure) ([place])     a. echo 03/2015: EF 30-35%, mild concentric LVH, severe HK of inf, inflat, & lat walls, mod MR, mild TR;  b. 04/2015 LV Gram: EF 55-65%.  . Ischemic cardiomyopathy     a. 03/2015 EF 30-35% post NSTEMI;  b. 04/2015 EF 55-65% on LV gram.  . DM2 (diabetes mellitus, type 2) ([place])     a. since 2002  . Tobacco abuse     a. quit 03/2015.  . Obesity   . Hyperlipidemia   . Hypotension     a. resulting in discontinuation of losartan and reduction in coreg dose.  Marland Kitchen Heavy menses     a. H/O IUD - expired in 2014 - remains in place.  . Iron deficiency anemia   . Kidney disease   . Uterine fibroid   . Hypertension   . MI (myocardial infarction) ([place]) 03/29/2015  . Renal insufficiency     HOSPITAL COURSE:   1. Chest pain, left shoulder blade pain, left arm pain, joint pain. CT angiogram of the chest was negative. Stress test was negative. Cardiac enzymes were negative. Likely anxiety attack. 2. History of CAD continue usual medications 3. Diarrhea, intermittent nausea. Will likely need GI workup as outpatient. Can consider stopping Glucophage with diarrhea. 4. Acute kidney injury patient was given IV fluids 5. Type 2 diabetes can restart oral hypoglycemics  as outpatient 6. Essential hypertension continue usual medications 7. Anxiety small prescription of Ativan given  DISCHARGE CONDITIONS:  Satisfactory  CONSULTS OBTAINED:  Treatment Team:  Wellington Hampshire, MD  DRUG ALLERGIES:   Allergies  Allergen Reactions  . Lisinopril Itching and Swelling  . Shellfish Allergy Itching and Swelling    DISCHARGE MEDICATIONS:   Current Discharge Medication List    START taking these medications   Details  LORazepam (ATIVAN) 1 MG tablet Take 1 tablet (1 mg total) by mouth every 8 (eight) hours as needed for anxiety. Qty: 10 tablet, Refills: 0      CONTINUE these medications which have CHANGED   Details  carvedilol (COREG) 3.125 MG tablet Take 2 tablets (6.25 mg total) by mouth 2 (two) times daily. Qty: 60 tablet, Refills: 3      CONTINUE these medications which have NOT CHANGED   Details  aspirin EC 81 MG tablet Take 81 mg by mouth daily.    atorvastatin (LIPITOR) 20 MG tablet Take 1 tablet (20 mg total) by mouth daily. Qty: 30 tablet, Refills: 6    glimepiride (AMARYL) 4 MG tablet Take 1 tablet (4 mg total) by mouth daily. Qty: 90 tablet, Refills: 1    loperamide (IMODIUM) 2 MG capsule Take 2 mg by mouth as needed for diarrhea or loose stools.  losartan (COZAAR) 50 MG tablet Take 1 tablet (50 mg total) by mouth daily. Qty: 30 tablet, Refills: 3    nitroGLYCERIN (NITROSTAT) 0.4 MG SL tablet Place 0.4 mg under the tongue every 5 (five) minutes as needed for chest pain. Reported on 02/20/2016    norethindrone-ethinyl estradiol 1/35 (ORTHO-NOVUM, NORTREL,CYCLAFEM) tablet Take 1 tablet by mouth daily.    pantoprazole (PROTONIX) 40 MG tablet Take 1 tablet (40 mg total) by mouth daily at 6 (six) AM. Qty: 90 tablet, Refills: 1    ticagrelor (BRILINTA) 90 MG TABS tablet Take 1 tablet (90 mg total) by mouth 2 (two) times daily. Qty: 60 tablet, Refills: 6   Associated Diagnoses: Coronary artery disease involving native coronary artery  of native heart without angina pectoris; S/P angioplasty with stent    ciprofloxacin-dexamethasone (CIPRODEX) otic suspension Place 4 drops into the right ear 2 (two) times daily. For the next 7 days Qty: 7.5 mL, Refills: 0    ferrous sulfate 325 (65 FE) MG tablet Take 1 tablet (325 mg total) by mouth 3 (three) times daily with meals. Qty: 90 tablet, Refills: 3      STOP taking these medications     HYDROcodone-acetaminophen (NORCO/VICODIN) 5-325 MG tablet          DISCHARGE INSTRUCTIONS:   Follow-up PMD one week  If you experience worsening of your admission symptoms, develop shortness of breath, life threatening emergency, suicidal or homicidal thoughts you must seek medical attention immediately by calling 911 or calling your MD immediately  if symptoms less severe.  You Must read complete instructions/literature along with all the possible adverse reactions/side effects for all the Medicines you take and that have been prescribed to you. Take any new Medicines after you have completely understood and accept all the possible adverse reactions/side effects.   Please note  You were cared for by a hospitalist during your hospital stay. If you have any questions about your discharge medications or the care you received while you were in the hospital after you are discharged, you can call the unit and asked to speak with the hospitalist on call if the hospitalist that took care of you is not available. Once you are discharged, your primary care physician will handle any further medical issues. Please note that NO REFILLS for any discharge medications will be authorized once you are discharged, as it is imperative that you return to your primary care physician (or establish a relationship with a primary care physician if you do not have one) for your aftercare needs so that they can reassess your need for medications and monitor your lab values.    Today   CHIEF COMPLAINT:   Chief  Complaint  Patient presents with  . Chest Pain    HISTORY OF PRESENT ILLNESS:  [name] [name]  is a 35 y.o. female with a known history of CAD presented with chest pain   VITAL SIGNS:  Blood pressure 130/79, pulse 101, temperature 98.3 F (36.8 C), temperature source Oral, resp. rate 16, height 5\' 4"  (1.626 m), weight 86.183 kg (190 lb), last menstrual period 01/09/2016, SpO2 100 %.  I/O:  No intake or output data in the 24 hours ending 03/01/16 1649  PHYSICAL EXAMINATION:  GENERAL:  35 y.o.-year-old patient lying in the bed with no acute distress.  EYES: Pupils equal, round, reactive to light and accommodation. No scleral icterus. Extraocular muscles intact.  HEENT: Head atraumatic, normocephalic. Oropharynx and nasopharynx clear.  NECK:  Supple, no jugular venous distention.  No thyroid enlargement, no tenderness.  LUNGS: Normal breath sounds bilaterally, no wheezing, rales,rhonchi or crepitation. No use of accessory muscles of respiration.  CARDIOVASCULAR: S1, S2 normal. No murmurs, rubs, or gallops.  ABDOMEN: Soft, non-tender, non-distended. Bowel sounds present. No organomegaly or mass.  EXTREMITIES: No pedal edema, cyanosis, or clubbing.  NEUROLOGIC: Cranial nerves II through XII are intact. Muscle strength 5/5 in all extremities. Sensation intact. Gait not checked.  PSYCHIATRIC: The patient is alert and oriented x 3.  SKIN: No obvious rash, lesion, or ulcer.   DATA REVIEW:   CBC  Recent Labs Lab 03/01/16 0053  WBC 5.6  HGB 10.5*  HCT 31.4*  PLT 442*    Chemistries   Recent Labs Lab 03/01/16 0053  NA 131*  K 4.5  CL 106  CO2 19*  GLUCOSE 239*  BUN 30*  CREATININE 1.46*  CALCIUM 8.6*    Cardiac Enzymes  Recent Labs Lab 03/01/16 1344  TROPONINI <0.03    Microbiology Results  Results for orders placed or performed during the hospital encounter of 02/13/16  Culture, group A strep     Status: None   Collection Time: 02/13/16  1:57 AM  Result Value  Ref Range Status   Specimen Description THROAT  Final   Special Requests NONE  Final   Culture NO BETA HEMOLYTIC STREPTOCOCCI ISOLATED  Final   Report Status 02/14/2016 FINAL  Final    RADIOLOGY:  Ct Angio Chest Pe W/cm &/or Wo Cm  03/01/2016  CLINICAL DATA:  35 year old female with chest pain, dyspnea, and tachycardia. EXAM: CT ANGIOGRAPHY CHEST WITH CONTRAST TECHNIQUE: Multidetector CT imaging of the chest was performed using the standard protocol during bolus administration of intravenous contrast. Multiplanar CT image reconstructions and MIPs were obtained to evaluate the vascular anatomy. CONTRAST:  100 cc Isovue 370 COMPARISON:  Chest radiograph dated 03/01/2016 FINDINGS: The lungs are clear. There is no pleural effusion or pneumothorax. The central airways are patent. The thoracic aorta appears unremarkable. There is no CT evidence of pulmonary embolism. No cardiomegaly or pericardial effusion. There is multiple cell coronary vascular calcification, advanced for patient's age. Correlation with EKG and cardiac calcium scoring and cardiology consult is advised. There is no hilar or mediastinal adenopathy. The esophagus and thyroid gland appear unremarkable. There is no axillary adenopathy. The chest wall soft tissues appear unremarkable. The osseous structures are intact. The visualized upper abdomen appears unremarkable. Review of the MIP images confirms the above findings. IMPRESSION: No CT evidence of pulmonary embolism. Multi vessel coronary vascular calcification advanced for the patient's age. Cardiology consult is advised. Electronically Signed   By: Anner Crete M.D.   On: 03/01/2016 04:23   Nm Myocar Multi W/spect W/wall Motion / Ef  03/01/2016   There was no ST segment deviation noted during stress.  Defect 1: There is a medium defect of moderate severity present in the basal inferolateral and mid inferolateral location.  Findings consistent with prior myocardial infarction. No  ischemia.  This is a low risk study.  The left ventricular ejection fraction is normal (55-65%).    Dg Chest Portable 1 View  03/01/2016  CLINICAL DATA:  Chest pain starting this morning. Left-sided pressure. Radiates to the back and left arm. Shortness of breath with nausea and vomiting. EXAM: PORTABLE CHEST 1 VIEW COMPARISON:  02/13/2016 FINDINGS: The heart size and mediastinal contours are within normal limits. Both lungs are clear. The visualized skeletal structures are unremarkable. IMPRESSION: No active disease. Electronically Signed   By: Gwyndolyn Saxon  Gerilyn Nestle M.D.   On: 03/01/2016 01:20    Management plans discussed with the patient, family and they are in agreement.  CODE STATUS:     Code Status Orders        Start     Ordered   03/01/16 0613  Full code   Continuous     03/01/16 0612    Code Status History    Date Active Date Inactive Code Status Order ID Comments User Context   08/21/2015  5:36 PM 08/22/2015 10:03 PM Full Code [id number]  Dustin Flock, MD ED   07/22/2015 11:05 AM 07/23/2015  6:32 PM Full Code [id number]  Epifanio Lesches, MD Inpatient   05/05/2015 10:59 AM 05/05/2015  8:55 PM Full Code [id number]  Peter M Martinique, MD Inpatient   05/05/2015  6:13 AM 05/05/2015 10:59 AM Full Code [id number]  Rise Patience, MD Inpatient      TOTAL TIME TAKING CARE OF THIS PATIENT: 35 minutes.    Loletha Grayer M.D on 03/01/2016 at 4:49 PM  Between 7am to 6pm - Pager - [phone]  After 6pm go to [email or web address] - password [email or web address] [facility]  Office  [phone]  CC: Primary care physician; Kathrine Haddock, NP

## 2016-03-01 NOTE — ED Notes (Addendum)
Patient ambulatory to triage with steady gait, without difficulty or distress noted; pt reports upper chest "pressure" with ache to left arm and nausea tonight; st hx MI yr ago; taken to room 2 and placed on card monitor for EKG

## 2016-03-01 NOTE — ED Notes (Signed)
Pt taken to CT.

## 2016-03-01 NOTE — ED Notes (Signed)
Pt reports pain has worsened, rated 3/10, states she does not need pain medication at this time.  Pt reports she also had some SOB and nausea.  Dr. Owens Shark to be informed.

## 2016-03-01 NOTE — Patient Instructions (Signed)
Follow up with the [facility]  March 19, 2016 @ 10:30 AM  [address] [address] [phone]

## 2016-03-01 NOTE — Progress Notes (Signed)
Pt admitted to room 238. A&Ox4, VSS, no complaints at this time. Skin assessed and telemetry verified with Andria Meuse, RN. Pt oriented to room and unit, including telephone and call bell. RN will continue to monitor and treat per MD orders. Rachael Fee, RN

## 2016-03-01 NOTE — H&P (Addendum)
[name] [name] is an 35 y.o. female.   Chief Complaint: Chest pain HPI: The patient with past medical history significant for coronary artery disease status post stent placement presents emergency department after having chest pain at rest. The pain began as the patient sat on her bed she was preparing to go to sleep. The pain was substernal and radiated to her left arm. Specifically started at her elbow and went down to her left hand. The patient then help pain at the base of her neck bilaterally that radiated to the center of her chest and into her back. She admits to associated nausea and multiple episodes of nonbloody nonbilious emesis. Her pain lasted approximately 10 minutes. It ceased by the time she arrived at the emergency department. Laboratory evaluation revealed mild hyponatremia as well as acute kidney injury.  Her history of heart disease combined with the presentation of chest pain prompted the emergency department staff called for admission.  Past Medical History  Diagnosis Date  . CAD (coronary artery disease)     a. NSTEMI 03/2015: mLCx 100% s/p PCI/DES, RI 90% s/p PCI/DES, EF 35%; b. 04/2015 Relook Cath: LM nl, LAD 40p, RI patent stent, LCX patent stent, RCA nl, EF 55-65%.  . Chronic systolic CHF (congestive heart failure) ([place])     a. echo 03/2015: EF 30-35%, mild concentric LVH, severe HK of inf, inflat, & lat walls, mod MR, mild TR;  b. 04/2015 LV Gram: EF 55-65%.  . Ischemic cardiomyopathy     a. 03/2015 EF 30-35% post NSTEMI;  b. 04/2015 EF 55-65% on LV gram.  . DM2 (diabetes mellitus, type 2) ([place])     a. since 2002  . Tobacco abuse     a. quit 03/2015.  . Obesity   . Hyperlipidemia   . Hypotension     a. resulting in discontinuation of losartan and reduction in coreg dose.  Marland Kitchen Heavy menses     a. H/O IUD - expired in 2014 - remains in place.  . Iron deficiency anemia   . Kidney disease   . Uterine fibroid   . Hypertension   . MI (myocardial infarction) ([place]) 03/29/2015  .  Renal insufficiency     Past Surgical History  Procedure Laterality Date  . Mouth surgery    . Cardiac catheterization  4/16    x2 stent [facility]  . Cardiac catheterization N/A 05/05/2015    Procedure: Left Heart Cath and Coronary Angiography;  Surgeon: Peter M Martinique, MD;  Location: [facility];  Service: Cardiovascular;  Laterality: N/A;    Family History  Problem Relation Age of Onset  . Diabetes Father   . Cancer Maternal Grandmother     lung  . Cancer Maternal Grandfather     prostate  . Diabetes Paternal Grandfather    Social History:  reports that she quit smoking about a year ago. She has never used smokeless tobacco. She reports that she does not drink alcohol or use illicit drugs.  Allergies:  Allergies  Allergen Reactions  . Lisinopril Itching and Swelling  . Shellfish Allergy Itching and Swelling    Medications Prior to Admission  Medication Sig Dispense Refill  . aspirin EC 81 MG tablet Take 81 mg by mouth daily.    Marland Kitchen atorvastatin (LIPITOR) 20 MG tablet Take 1 tablet (20 mg total) by mouth daily. 30 tablet 6  . carvedilol (COREG) 3.125 MG tablet Take 1 tablet (3.125 mg total) by mouth 2 (two) times daily. (Patient taking differently:  Take 6.25 mg by mouth 2 (two) times daily. ) 60 tablet 3  . glimepiride (AMARYL) 4 MG tablet Take 1 tablet (4 mg total) by mouth daily. 90 tablet 1  . loperamide (IMODIUM) 2 MG capsule Take 2 mg by mouth as needed for diarrhea or loose stools.    Marland Kitchen losartan (COZAAR) 50 MG tablet Take 1 tablet (50 mg total) by mouth daily. 30 tablet 3  . nitroGLYCERIN (NITROSTAT) 0.4 MG SL tablet Place 0.4 mg under the tongue every 5 (five) minutes as needed for chest pain. Reported on 02/20/2016    . norethindrone-ethinyl estradiol 1/35 (ORTHO-NOVUM, NORTREL,CYCLAFEM) tablet Take 1 tablet by mouth daily.    . pantoprazole (PROTONIX) 40 MG tablet Take 1 tablet (40 mg total) by mouth daily at 6 (six) AM. 90 tablet 1  . ticagrelor (BRILINTA) 90 MG TABS  tablet Take 1 tablet (90 mg total) by mouth 2 (two) times daily. 60 tablet 6  . ciprofloxacin-dexamethasone (CIPRODEX) otic suspension Place 4 drops into the right ear 2 (two) times daily. For the next 7 days 7.5 mL 0  . ferrous sulfate 325 (65 FE) MG tablet Take 1 tablet (325 mg total) by mouth 3 (three) times daily with meals. 90 tablet 3  . HYDROcodone-acetaminophen (NORCO/VICODIN) 5-325 MG tablet Take 1 tablet by mouth every 4 (four) hours as needed for moderate pain. 15 tablet 0    Results for orders placed or performed during the hospital encounter of 03/01/16 (from the past 48 hour(s))  Basic metabolic panel     Status: Abnormal   Collection Time: 03/01/16 12:53 AM  Result Value Ref Range   Sodium 131 (L) 135 - 145 mmol/L   Potassium 4.5 3.5 - 5.1 mmol/L   Chloride 106 101 - 111 mmol/L   CO2 19 (L) 22 - 32 mmol/L   Glucose, Bld 239 (H) 65 - 99 mg/dL   BUN 30 (H) 6 - 20 mg/dL   Creatinine, Ser 1.46 (H) 0.44 - 1.00 mg/dL   Calcium 8.6 (L) 8.9 - 10.3 mg/dL   GFR calc non Af Amer 45 (L) >60 mL/min   GFR calc Af Amer 52 (L) >60 mL/min    Comment: (NOTE) The eGFR has been calculated using the CKD EPI equation. This calculation has not been validated in all clinical situations. eGFR's persistently <60 mL/min signify possible Chronic Kidney Disease.    Anion gap 6 5 - 15  CBC     Status: Abnormal   Collection Time: 03/01/16 12:53 AM  Result Value Ref Range   WBC 5.6 3.6 - 11.0 K/uL   RBC 4.04 3.80 - 5.20 MIL/uL   Hemoglobin 10.5 (L) 12.0 - 16.0 g/dL   HCT 31.4 (L) 35.0 - 47.0 %   MCV 77.8 (L) 80.0 - 100.0 fL   MCH 26.0 26.0 - 34.0 pg   MCHC 33.4 32.0 - 36.0 g/dL   RDW 14.9 (H) 11.5 - 14.5 %   Platelets 442 (H) 150 - 440 K/uL  Troponin I     Status: None   Collection Time: 03/01/16 12:53 AM  Result Value Ref Range   Troponin I <0.03 <0.031 ng/mL    Comment:        NO INDICATION OF MYOCARDIAL INJURY.   Fibrin derivatives D-Dimer ([facility] only)     Status: Abnormal    Collection Time: 03/01/16 12:53 AM  Result Value Ref Range   Fibrin derivatives D-dimer ([facility]) 725 (H) 0 - 499    Comment: <> Exclusion  of Venous Thromboembolism (VTE) - OUTPATIENTS ONLY        (Emergency Department or [place])             0-499 ng/ml (FEU)  : With a low to intermediate pretest                                        probability for VTE this test result                                        excludes the diagnosis of VTE.           > 499 ng/ml (FEU)  : VTE not excluded.  Additional work up                                   for VTE is required.   <>  Testing on Inpatients and Evaluation of Disseminated Intravascular        Coagulation ([facility])             Reference Range:   0-499 ng/ml (FEU)   Pregnancy, urine POC     Status: None   Collection Time: 03/01/16  3:50 AM  Result Value Ref Range   Preg Test, Ur NEGATIVE NEGATIVE    Comment:        THE SENSITIVITY OF THIS METHODOLOGY IS >24 mIU/mL   Troponin I     Status: None   Collection Time: 03/01/16  4:29 AM  Result Value Ref Range   Troponin I <0.03 <0.031 ng/mL    Comment:        NO INDICATION OF MYOCARDIAL INJURY.    Ct Angio Chest Pe W/cm &/or Wo Cm  03/01/2016  CLINICAL DATA:  35 year old female with chest pain, dyspnea, and tachycardia. EXAM: CT ANGIOGRAPHY CHEST WITH CONTRAST TECHNIQUE: Multidetector CT imaging of the chest was performed using the standard protocol during bolus administration of intravenous contrast. Multiplanar CT image reconstructions and MIPs were obtained to evaluate the vascular anatomy. CONTRAST:  100 cc Isovue 370 COMPARISON:  Chest radiograph dated 03/01/2016 FINDINGS: The lungs are clear. There is no pleural effusion or pneumothorax. The central airways are patent. The thoracic aorta appears unremarkable. There is no CT evidence of pulmonary embolism. No cardiomegaly or pericardial effusion. There is multiple cell coronary vascular calcification, advanced for patient's age. Correlation  with EKG and cardiac calcium scoring and cardiology consult is advised. There is no hilar or mediastinal adenopathy. The esophagus and thyroid gland appear unremarkable. There is no axillary adenopathy. The chest wall soft tissues appear unremarkable. The osseous structures are intact. The visualized upper abdomen appears unremarkable. Review of the MIP images confirms the above findings. IMPRESSION: No CT evidence of pulmonary embolism. Multi vessel coronary vascular calcification advanced for the patient's age. Cardiology consult is advised. Electronically Signed   By: Anner Crete M.D.   On: 03/01/2016 04:23   Dg Chest Portable 1 View  03/01/2016  CLINICAL DATA:  Chest pain starting this morning. Left-sided pressure. Radiates to the back and left arm. Shortness of breath with nausea and vomiting. EXAM: PORTABLE CHEST 1 VIEW COMPARISON:  02/13/2016 FINDINGS: The heart size and mediastinal contours are within normal limits. Both lungs are clear. The  visualized skeletal structures are unremarkable. IMPRESSION: No active disease. Electronically Signed   By: Lucienne Capers M.D.   On: 03/01/2016 01:20    Review of Systems  Constitutional: Negative for fever and chills.  HENT: Negative for sore throat and tinnitus.   Eyes: Negative for blurred vision and redness.  Respiratory: Negative for cough and shortness of breath.   Cardiovascular: Positive for chest pain. Negative for palpitations, orthopnea and PND.  Gastrointestinal: Positive for nausea and diarrhea (Chronic). Negative for vomiting and abdominal pain.  Genitourinary: Negative for dysuria, urgency and frequency.  Musculoskeletal: Negative for myalgias and joint pain.  Skin: Negative for rash.       No lesions  Neurological: Negative for speech change, focal weakness and weakness.  Endo/Heme/Allergies: Does not bruise/bleed easily.       No temperature intolerance  Psychiatric/Behavioral: Negative for depression and suicidal ideas. The  patient is nervous/anxious.     Blood pressure 128/67, pulse 103, temperature 98.4 F (36.9 C), temperature source Oral, resp. rate 18, height 5' 4" (1.626 m), weight 86.183 kg (190 lb), last menstrual period 01/09/2016, SpO2 100 %. Physical Exam  Vitals reviewed. Constitutional: She is oriented to person, place, and time. She appears well-developed and well-nourished. No distress.  HENT:  Head: Normocephalic and atraumatic.  Mouth/Throat: Oropharynx is clear and moist.  Eyes: Conjunctivae and EOM are normal. Pupils are equal, round, and reactive to light.  Neck: Normal range of motion. Neck supple. No JVD present. No tracheal deviation present. No thyromegaly present.  Cardiovascular: Normal rate, regular rhythm and normal heart sounds.  Exam reveals no gallop and no friction rub.   No murmur heard. Respiratory: Effort normal and breath sounds normal.  GI: Soft. Bowel sounds are normal. She exhibits no distension. There is no tenderness.  Genitourinary:  Deferred  Musculoskeletal: Normal range of motion. She exhibits no edema.  Lymphadenopathy:    She has no cervical adenopathy.  Neurological: She is alert and oriented to person, place, and time. No cranial nerve deficit. She exhibits normal muscle tone.  Skin: Skin is warm and dry. No rash noted. No erythema.  Psychiatric: She has a normal mood and affect. Her behavior is normal. Judgment and thought content normal.     Assessment/Plan This is a 35 year old female admitted for chest pain. 1. Chest pain: Difficult to distinguish anxiety symptoms from angina. The patient has an extensive history of coronary artery disease accelerated by diabetes. Her initial troponins have been negative however because her pain is difficult to characterize I have started her on heparin. Follow cardiac enzymes. Cardiology has been consulted. Continue to monitor telemetry. 2. Coronary artery disease: Continue aspirin and Brilinta 3. Acute kidney injury:  Likely secondary to diarrhea. Gently hydrate with intravenous fluid. Do not continue metformin. 4. Diabetes mellitus type 2: Hold oral hypoglycemics. The patient has been started on sliding scale insulin which I recommend continuing as an outpatient as she has GI side effects from metformin and/or hypermotility of her GI tract secondary to diabetes. A1c had been reasonably controlled although the patient has multiple episodes of hyperglycemia per report. She will benefit from long-term insulin use. 5. Essential hypertension: Currently uncontrolled; I placed nitroglycerin on the patient's chest. Continue losartan and carvedilol 6. Anxiety: The patient had been on Remeron until a few months ago which caused significant weight gain. Also, this medication is not up good choice for a patient with diabetes. She describes panic attacks. Thus I have started Ativan by mouth as needed for  anxiety 7. DVT prophylaxis: Therapeutic anticoagulation for now 8. GI prophylaxis: Pantoprazole per home regimen The patient is a full code. Time spent on admission orders and patient care approximately 45 minutes  Harrie Foreman, MD 03/01/2016, 6:18 AM

## 2016-03-04 ENCOUNTER — Telehealth: Payer: Self-pay | Admitting: Unknown Physician Specialty

## 2016-03-04 NOTE — Telephone Encounter (Signed)
Called and spoke to patient. She stated that Dr. Marcille Blanco at the ED recommended that she start on an insulin.

## 2016-03-04 NOTE — Telephone Encounter (Signed)
Yes she does.  I see that since she last saw me, she was in the hospital and hasn't been back for a f/u.  She needs to be seen ASAP to manage diabetes care.

## 2016-03-04 NOTE — Telephone Encounter (Signed)
Pt scheduled for 03/06/16 @ 8:30am. Thanks.

## 2016-03-04 NOTE — Telephone Encounter (Signed)
Pt was seen in ED on 02/29/16 at [facility].  It was recommended that she be switched to insulin.  Please call pt to advise.

## 2016-03-06 ENCOUNTER — Encounter: Payer: Self-pay | Admitting: Unknown Physician Specialty

## 2016-03-06 ENCOUNTER — Ambulatory Visit (INDEPENDENT_AMBULATORY_CARE_PROVIDER_SITE_OTHER): Payer: Medicaid Other | Admitting: Unknown Physician Specialty

## 2016-03-06 VITALS — BP 132/82 | HR 98 | Temp 99.1°F | Ht 64.6 in | Wt 195.4 lb

## 2016-03-06 DIAGNOSIS — E1122 Type 2 diabetes mellitus with diabetic chronic kidney disease: Secondary | ICD-10-CM | POA: Diagnosis not present

## 2016-03-06 DIAGNOSIS — E785 Hyperlipidemia, unspecified: Secondary | ICD-10-CM | POA: Diagnosis not present

## 2016-03-06 DIAGNOSIS — Z794 Long term (current) use of insulin: Secondary | ICD-10-CM | POA: Diagnosis not present

## 2016-03-06 DIAGNOSIS — I10 Essential (primary) hypertension: Secondary | ICD-10-CM

## 2016-03-06 DIAGNOSIS — N183 Chronic kidney disease, stage 3 (moderate): Secondary | ICD-10-CM | POA: Diagnosis not present

## 2016-03-06 DIAGNOSIS — D509 Iron deficiency anemia, unspecified: Secondary | ICD-10-CM

## 2016-03-06 LAB — GLUCOSE HEMOCUE WAIVED: Glu Hemocue Waived: 256 mg/dL — ABNORMAL HIGH (ref 65–99)

## 2016-03-06 MED ORDER — FERROUS SULFATE 325 (65 FE) MG PO TABS: 325.0000 mg | ORAL_TABLET | Freq: Three times a day (TID) | ORAL | Status: DC

## 2016-03-06 MED ORDER — BLOOD GLUCOSE MONITOR KIT: PACK | Status: DC

## 2016-03-06 MED ORDER — CARVEDILOL 3.125 MG PO TABS: 6.2500 mg | ORAL_TABLET | Freq: Two times a day (BID) | ORAL | Status: DC

## 2016-03-06 MED ORDER — INSULIN GLARGINE 100 UNIT/ML SOLOSTAR PEN: 60.0000 [IU] | PEN_INJECTOR | Freq: Every day | SUBCUTANEOUS | Status: DC

## 2016-03-06 NOTE — Patient Instructions (Signed)
Base your long acting insulin on your fasting (usually in the morning) blood sugar.  Increase long acting (daily insulin) 2 units if fasting blood sugar is greater than 120.  Decrease by 2 units if fasting blood sugar is less than 95.    

## 2016-03-06 NOTE — Assessment & Plan Note (Signed)
Refilled Iron. Noted decreasing hemoglobin. Check anemia panel next visit.

## 2016-03-06 NOTE — Assessment & Plan Note (Addendum)
FBG is 256. Start on Lantus 20 units daily at bedtime. Base your long acting insulin on your fasting (usually in the morning) blood sugar.  Increase long acting (daily insulin) 2 units if fasting blood sugar is greater than 120.  Decrease by 2 units if fasting blood sugar is less than 95.

## 2016-03-06 NOTE — Assessment & Plan Note (Signed)
Ordered lipid panel. Will review and call patient with results.

## 2016-03-06 NOTE — Progress Notes (Signed)
BP 132/82 mmHg  Pulse 98  Temp(Src) 99.1 F (37.3 C)  Ht 5' 4.6" (1.641 m)  Wt 195 lb 6.4 oz (88.633 kg)  BMI 32.91 kg/m2  SpO2 99%  LMP 01/09/2016 (Exact Date)   Subjective:    Patient ID: [name] [name], female    DOB: [birth year], 35 y.o.   MRN: [id number]  HPI: [name] [name] is a 36 y.o. female  Chief Complaint  Patient presents with  . Hospitalization Follow-up  . Diabetes  . Medication Refill    pt states she needs refills on iron, nitro, and carvediol   Diabetes:  Using medications without difficulties No hypoglycemic episodes No hyperglycemic episodes Feet problems: Numbness Blood Sugars averaging: 180-200+ Eye exam within last year: 10/16  Hypertension:  Using medications without difficulty Average home BPs: 110-130/80  Using medication without problems or lightheadedness: occasional No chest pain with exertion or shortness of breath: occasional No Edema: some in feet  Elevated Cholesterol: Using medications without problems Muscle aches: Calves Diet compliance: Eats more balanced diet now and watches carbs Exercise: Just started back doing bicycle and working out at home with phone app programs  Relevant past medical, surgical, family and social history reviewed and updated as indicated. Interim medical history since our last visit reviewed. Allergies and medications reviewed and updated.  Review of Systems  Constitutional: Negative.   HENT: Negative.   Eyes: Negative.   Respiratory: Positive for shortness of breath.   Cardiovascular: Negative.        Feet swelling  Gastrointestinal: Negative.   Endocrine: Negative.   Genitourinary: Negative.   Musculoskeletal: Negative.   Skin: Negative.   Allergic/Immunologic: Negative.   Neurological: Positive for dizziness and light-headedness.  Hematological: Negative.   Psychiatric/Behavioral: Negative.     Per HPI unless specifically indicated above     Objective:    BP 132/82 mmHg  Pulse 98   Temp(Src) 99.1 F (37.3 C)  Ht 5' 4.6" (1.641 m)  Wt 195 lb 6.4 oz (88.633 kg)  BMI 32.91 kg/m2  SpO2 99%  LMP 01/09/2016 (Exact Date)  Wt Readings from Last 3 Encounters:  03/06/16 195 lb 6.4 oz (88.633 kg)  03/01/16 190 lb (86.183 kg)  02/24/16 190 lb (86.183 kg)    Physical Exam  Constitutional: She is oriented to person, place, and time. She appears well-developed and well-nourished. No distress.  HENT:  Head: Normocephalic and atraumatic.  Eyes: Conjunctivae and lids are normal. Right eye exhibits no discharge. Left eye exhibits no discharge. No scleral icterus.  Neck: Normal range of motion. Neck supple. No JVD present. Carotid bruit is not present.  Cardiovascular: Normal rate, regular rhythm and normal heart sounds.   Pulmonary/Chest: Effort normal and breath sounds normal.  Abdominal: Normal appearance. There is no splenomegaly or hepatomegaly.  Musculoskeletal: Normal range of motion.  Neurological: She is alert and oriented to person, place, and time.  Skin: Skin is warm, dry and intact. No rash noted. No pallor.  Psychiatric: She has a normal mood and affect. Her behavior is normal. Judgment and thought content normal.      Assessment & Plan:   Problem List Items Addressed This Visit      Unprioritized   DM2 (diabetes mellitus, type 2) ([place]) - Primary    FBG is 256. Start on Lantus 20 units daily at bedtime. Base your long acting insulin on your fasting (usually in the morning) blood sugar.  Increase long acting (daily insulin) 2 units if fasting blood sugar is greater  than 120.  Decrease by 2 units if fasting blood sugar is less than 95.        Relevant Medications   Insulin Glargine (LANTUS SOLOSTAR) 100 UNIT/ML Solostar Pen   Other Relevant Orders   Glucose Hemocue Waived   Comprehensive metabolic panel   Lipid Panel w/o Chol/HDL Ratio   Hyperlipidemia    Ordered lipid panel. Will review and call patient with results.       Relevant Medications    carvedilol (COREG) 3.125 MG tablet   Iron deficiency anemia    Refilled Iron. Noted decreasing hemoglobin. Check anemia panel next visit.        Relevant Medications   ferrous sulfate 325 (65 FE) MG tablet    Other Visit Diagnoses    Essential hypertension        Stable. Continue present medication.    Relevant Medications    carvedilol (COREG) 3.125 MG tablet        Follow up plan: Return in about 4 weeks (around 04/03/2016).

## 2016-03-07 ENCOUNTER — Telehealth: Payer: Self-pay

## 2016-03-07 LAB — COMPREHENSIVE METABOLIC PANEL
ALT: 15 IU/L (ref 0–32)
AST: 17 IU/L (ref 0–40)
Albumin/Globulin Ratio: 1 — ABNORMAL LOW (ref 1.2–2.2)
Albumin: 3.1 g/dL — ABNORMAL LOW (ref 3.5–5.5)
Alkaline Phosphatase: 73 IU/L (ref 39–117)
BUN/Creatinine Ratio: 21 — ABNORMAL HIGH (ref 8–20)
BUN: 21 mg/dL — ABNORMAL HIGH (ref 6–20)
Bilirubin Total: <0.2 mg/dL (ref 0.0–1.2)
CO2: 15 mmol/L — ABNORMAL LOW (ref 18–29)
Calcium: 8.8 mg/dL (ref 8.7–10.2)
Chloride: 106 mmol/L (ref 96–106)
Creatinine, Ser: 0.99 mg/dL (ref 0.57–1.00)
GFR calc Af Amer: 85 mL/min/{1.73_m2} (ref >59)
GFR calc non Af Amer: 74 mL/min/{1.73_m2} (ref >59)
Globulin, Total: 3.2 g/dL (ref 1.5–4.5)
Glucose: 244 mg/dL — ABNORMAL HIGH (ref 65–99)
Potassium: 5.2 mmol/L (ref 3.5–5.2)
Sodium: 137 mmol/L (ref 134–144)
Total Protein: 6.3 g/dL (ref 6.0–8.5)

## 2016-03-07 LAB — LIPID PANEL W/O CHOL/HDL RATIO
Cholesterol, Total: 154 mg/dL (ref 100–199)
HDL: 39 mg/dL — ABNORMAL LOW (ref >39)
LDL Calculated: 93 mg/dL (ref 0–99)
Triglycerides: 111 mg/dL (ref 0–149)
VLDL Cholesterol Cal: 22 mg/dL (ref 5–40)

## 2016-03-07 NOTE — Telephone Encounter (Signed)
Pharmacy sent a fax stating that they need a prescription for the pen needles to go along with patient's Lantus that was written yesterday.

## 2016-03-08 ENCOUNTER — Other Ambulatory Visit: Payer: Self-pay | Admitting: Unknown Physician Specialty

## 2016-03-08 MED ORDER — PEN NEEDLES 32G X 4 MM MISC: 1.0000 [IU] | Freq: Every day | Status: DC

## 2016-03-08 NOTE — Telephone Encounter (Signed)
These prescriptions were not prescribed my me.  I can not fill the Ativan without an appt to discuss and I would prefer Dr. Fletcher Anon manage the Nitroglycerin

## 2016-03-08 NOTE — Telephone Encounter (Signed)
Called and let patient know what [name] [name] said. I asked if patient would like to schedule an appointment to talk with [name] [name] about her ativan but patient said she could go through [facility] or she would just wait until her next appointment with [place].

## 2016-03-08 NOTE — Telephone Encounter (Signed)
Patient was last seen 03/06/16.

## 2016-03-08 NOTE — Telephone Encounter (Signed)
Pt needs refill on ativan and nitrostat sent to [facility] [address]

## 2016-03-11 ENCOUNTER — Telehealth: Payer: Self-pay | Admitting: Cardiovascular Disease

## 2016-03-11 ENCOUNTER — Other Ambulatory Visit: Payer: Self-pay | Admitting: *Deleted

## 2016-03-11 MED ORDER — NITROGLYCERIN 0.4 MG SL SUBL: 0.4000 mg | SUBLINGUAL_TABLET | SUBLINGUAL | Status: DC | PRN

## 2016-03-11 NOTE — Telephone Encounter (Signed)
°*  STAT* If patient is at the pharmacy, call can be transferred to refill team.   1. Which medications need to be refilled? (please list name of each medication and dose if known) Nitrostat  0.4 mg tab SL prn   2. Which pharmacy/location (including street and city if local pharmacy) is medication to be sent to?  [facility] [address] [place]    3. Do they need a 30 day or 90 day supply?   No preference

## 2016-03-11 NOTE — Telephone Encounter (Signed)
Requested Prescriptions   Signed Prescriptions Disp Refills  . nitroGLYCERIN (NITROSTAT) 0.4 MG SL tablet 25 tablet 3    Sig: Place 1 tablet (0.4 mg total) under the tongue every 5 (five) minutes as needed for chest pain. Reported on 02/20/2016    Authorizing Provider: Kathlyn [place] A    Ordering User: Britt Bottom

## 2016-03-12 ENCOUNTER — Encounter: Payer: Self-pay | Admitting: Emergency Medicine

## 2016-03-12 ENCOUNTER — Emergency Department
Admission: EM | Admit: 2016-03-12 | Discharge: 2016-03-12 | Disposition: A | Discharge status: Home / Self Care | Payer: Medicaid Other | Attending: Emergency Medicine | Admitting: Emergency Medicine

## 2016-03-12 ENCOUNTER — Emergency Department: Payer: Medicaid Other

## 2016-03-12 DIAGNOSIS — D259 Leiomyoma of uterus, unspecified: Secondary | ICD-10-CM | POA: Insufficient documentation

## 2016-03-12 DIAGNOSIS — Z7984 Long term (current) use of oral hypoglycemic drugs: Secondary | ICD-10-CM | POA: Insufficient documentation

## 2016-03-12 DIAGNOSIS — Z87891 Personal history of nicotine dependence: Secondary | ICD-10-CM | POA: Insufficient documentation

## 2016-03-12 DIAGNOSIS — I2510 Atherosclerotic heart disease of native coronary artery without angina pectoris: Secondary | ICD-10-CM | POA: Insufficient documentation

## 2016-03-12 DIAGNOSIS — F419 Anxiety disorder, unspecified: Secondary | ICD-10-CM | POA: Diagnosis not present

## 2016-03-12 DIAGNOSIS — G4700 Insomnia, unspecified: Secondary | ICD-10-CM | POA: Diagnosis not present

## 2016-03-12 DIAGNOSIS — E119 Type 2 diabetes mellitus without complications: Secondary | ICD-10-CM | POA: Diagnosis not present

## 2016-03-12 DIAGNOSIS — Z7982 Long term (current) use of aspirin: Secondary | ICD-10-CM | POA: Diagnosis not present

## 2016-03-12 DIAGNOSIS — Z794 Long term (current) use of insulin: Secondary | ICD-10-CM | POA: Insufficient documentation

## 2016-03-12 DIAGNOSIS — R079 Chest pain, unspecified: Secondary | ICD-10-CM | POA: Diagnosis not present

## 2016-03-12 DIAGNOSIS — E669 Obesity, unspecified: Secondary | ICD-10-CM | POA: Insufficient documentation

## 2016-03-12 DIAGNOSIS — E785 Hyperlipidemia, unspecified: Secondary | ICD-10-CM | POA: Diagnosis not present

## 2016-03-12 DIAGNOSIS — I255 Ischemic cardiomyopathy: Secondary | ICD-10-CM | POA: Diagnosis not present

## 2016-03-12 DIAGNOSIS — Z79899 Other long term (current) drug therapy: Secondary | ICD-10-CM | POA: Insufficient documentation

## 2016-03-12 DIAGNOSIS — I5022 Chronic systolic (congestive) heart failure: Secondary | ICD-10-CM | POA: Diagnosis not present

## 2016-03-12 DIAGNOSIS — I110 Hypertensive heart disease with heart failure: Secondary | ICD-10-CM | POA: Insufficient documentation

## 2016-03-12 DIAGNOSIS — I959 Hypotension, unspecified: Secondary | ICD-10-CM | POA: Diagnosis not present

## 2016-03-12 DIAGNOSIS — I252 Old myocardial infarction: Secondary | ICD-10-CM | POA: Diagnosis not present

## 2016-03-12 LAB — COMPREHENSIVE METABOLIC PANEL
ALT: 16 U/L (ref 14–54)
AST: 17 U/L (ref 15–41)
Albumin: 3.1 g/dL — ABNORMAL LOW (ref 3.5–5.0)
Alkaline Phosphatase: 63 U/L (ref 38–126)
Anion gap: 5 (ref 5–15)
BUN: 27 mg/dL — ABNORMAL HIGH (ref 6–20)
CO2: 18 mmol/L — ABNORMAL LOW (ref 22–32)
Calcium: 8.6 mg/dL — ABNORMAL LOW (ref 8.9–10.3)
Chloride: 110 mmol/L (ref 101–111)
Creatinine, Ser: 1.11 mg/dL — ABNORMAL HIGH (ref 0.44–1.00)
GFR calc Af Amer: >60 mL/min (ref >60)
GFR calc non Af Amer: >60 mL/min (ref >60)
Glucose, Bld: 156 mg/dL — ABNORMAL HIGH (ref 65–99)
Potassium: 3.9 mmol/L (ref 3.5–5.1)
Sodium: 133 mmol/L — ABNORMAL LOW (ref 135–145)
Total Bilirubin: <0.1 mg/dL — ABNORMAL LOW (ref 0.3–1.2)
Total Protein: 7 g/dL (ref 6.5–8.1)

## 2016-03-12 LAB — CBC
HCT: 34.1 % — ABNORMAL LOW (ref 35.0–47.0)
Hemoglobin: 11.2 g/dL — ABNORMAL LOW (ref 12.0–16.0)
MCH: 25.8 pg — ABNORMAL LOW (ref 26.0–34.0)
MCHC: 32.8 g/dL (ref 32.0–36.0)
MCV: 78.7 fL — ABNORMAL LOW (ref 80.0–100.0)
Platelets: 426 10*3/uL (ref 150–440)
RBC: 4.33 MIL/uL (ref 3.80–5.20)
RDW: 15.6 % — ABNORMAL HIGH (ref 11.5–14.5)
WBC: 6.1 10*3/uL (ref 3.6–11.0)

## 2016-03-12 LAB — TROPONIN I: Troponin I: <0.03 ng/mL (ref <0.031)

## 2016-03-12 MED ORDER — LORAZEPAM 1 MG PO TABS: 1.0000 mg | ORAL_TABLET | Freq: Two times a day (BID) | ORAL | Status: DC

## 2016-03-12 MED ORDER — MELATONIN 3 MG PO TABS: 1.0000 | ORAL_TABLET | Freq: Every day | ORAL | Status: DC

## 2016-03-12 NOTE — ED Provider Notes (Signed)
[facility] Emergency Department Provider Note     Time seen: ----------------------------------------- 8:52 AM on 03/12/2016 -----------------------------------------    I have reviewed the triage vital signs and the nursing notes.   HISTORY  Chief Complaint Chest Pain    HPI [name] [name] is a 35 y.o. female who presents ER for chest pain. Patient states she's had intermittent chest pain for several days, exertion seems to make her chest pain worse as well as anxiety. She describes it as left-sided chest. Patient states this is similar to when she was admitted in the hospital recently. Her cardiologist is Dr. Fletcher Anon. Currently pain is mild.   Past Medical History  Diagnosis Date  . CAD (coronary artery disease)     a. NSTEMI 03/2015: mLCx 100% s/p PCI/DES, RI 90% s/p PCI/DES, EF 35%; b. 04/2015 Relook Cath: LM nl, LAD 40p, RI patent stent, LCX patent stent, RCA nl, EF 55-65%.  . Chronic systolic CHF (congestive heart failure) ([place])     a. echo 03/2015: EF 30-35%, mild concentric LVH, severe HK of inf, inflat, & lat walls, mod MR, mild TR;  b. 04/2015 LV Gram: EF 55-65%.  . Ischemic cardiomyopathy     a. 03/2015 EF 30-35% post NSTEMI;  b. 04/2015 EF 55-65% on LV gram.  . DM2 (diabetes mellitus, type 2) ([place])     a. since 2002  . Tobacco abuse     a. quit 03/2015.  . Obesity   . Hyperlipidemia   . Hypotension     a. resulting in discontinuation of losartan and reduction in coreg dose.  Marland Kitchen Heavy menses     a. H/O IUD - expired in 2014 - remains in place.  . Iron deficiency anemia   . Kidney disease   . Uterine fibroid   . Hypertension   . MI (myocardial infarction) ([place]) 03/29/2015  . Renal insufficiency     Patient Active Problem List   Diagnosis Date Noted  . IBS (irritable bowel syndrome) 12/15/2015  . Proteinuria 08/30/2015  . Symptomatic anemia   . Coronary artery disease involving native coronary artery of native heart with angina pectoris  with documented spasm ([place])   . Angina pectoris ([place])   . Iron deficiency anemia   . Heavy menses   . SOB (shortness of breath) 08/21/2015  . Chest pain 05/05/2015  . CAD in native artery 05/05/2015  . Pain in the chest   . Hyperlipidemia 04/13/2015  . CAD (coronary artery disease)   . Chronic systolic CHF (congestive heart failure) ([place])   . Ischemic cardiomyopathy   . DM2 (diabetes mellitus, type 2) ([place])   . Tobacco abuse   . Obesity     Past Surgical History  Procedure Laterality Date  . Mouth surgery    . Cardiac catheterization  4/16    x2 stent [facility]  . Cardiac catheterization N/A 05/05/2015    Procedure: Left Heart Cath and Coronary Angiography;  Surgeon: Peter M Martinique, MD;  Location: [place] [facility];  Service: Cardiovascular;  Laterality: N/A;    Allergies Lisinopril and Shellfish allergy  Social History Social History  Substance Use Topics  . Smoking status: Former Smoker -- 15 years    Quit date: 03/10/2015  . Smokeless tobacco: Never Used  . Alcohol Use: No    Review of Systems Constitutional: Negative for fever. Eyes: Negative for visual changes. ENT: Negative for sore throat. Cardiovascular: Positive for chest pain Respiratory: Negative for shortness of breath. Gastrointestinal: Negative for abdominal  pain, vomiting and diarrhea. Genitourinary: Negative for dysuria. Musculoskeletal: Negative for back pain. Skin: Negative for rash. Neurological: Negative for headaches, focal weakness or numbness.  10-point ROS otherwise negative.  ____________________________________________   PHYSICAL EXAM:  VITAL SIGNS: ED Triage Vitals  Enc Vitals Group     BP 03/12/16 0837 147/84 mmHg     Pulse Rate 03/12/16 0837 98     Resp 03/12/16 0837 20     Temp 03/12/16 0837 98.4 F (36.9 C)     Temp Source 03/12/16 0837 Oral     SpO2 03/12/16 0837 100 %     Weight 03/12/16 0837 195 lb (88.451 kg)     Height 03/12/16 0837 5\' 4"  (1.626 m)     Head Cir --       Peak Flow --      Pain Score 03/12/16 0837 4     Pain Loc --      Pain Edu? --      Excl. in [place]? --     Constitutional: Alert and oriented. Well appearing and in no distress. Eyes: Conjunctivae are normal. PERRL. Normal extraocular movements. ENT   Head: Normocephalic and atraumatic.   Nose: No congestion/rhinnorhea.   Mouth/Throat: Mucous membranes are moist.   Neck: No stridor. Cardiovascular: Normal rate, regular rhythm. Normal and symmetric distal pulses are present in all extremities. No murmurs, rubs, or gallops. Respiratory: Normal respiratory effort without tachypnea nor retractions. Breath sounds are clear and equal bilaterally. No wheezes/rales/rhonchi. Gastrointestinal: Soft and nontender. No distention. No abdominal bruits.  Musculoskeletal: Nontender with normal range of motion in all extremities. No joint effusions.  No lower extremity tenderness nor edema. Neurologic:  Normal speech and language. No gross focal neurologic deficits are appreciated.  Skin:  Skin is warm, dry and intact. No rash noted. Psychiatric: Mood and affect are normal. Speech and behavior are normal. Patient exhibits appropriate insight and judgment. ____________________________________________  EKG: Interpreted by me. Normal sinus rhythm with a rate of 92 bpm, normal PR interval, normal QRS, normal QT interval. Normal axis. Septal infarct age indeterminate.  ____________________________________________  ED COURSE:  Pertinent labs & imaging results that were available during my care of the patient were reviewed by me and considered in my medical decision making (see chart for details). Patient is in no acute distress, will check basic labs and reevaluate. ____________________________________________    LABS (pertinent positives/negatives)  Labs Reviewed  CBC - Abnormal; Notable for the following:    Hemoglobin 11.2 (*)    HCT 34.1 (*)    MCV 78.7 (*)    MCH 25.8 (*)    RDW  15.6 (*)    All other components within normal limits  COMPREHENSIVE METABOLIC PANEL - Abnormal; Notable for the following:    Sodium 133 (*)    CO2 18 (*)    Glucose, Bld 156 (*)    BUN 27 (*)    Creatinine, Ser 1.11 (*)    Calcium 8.6 (*)    Albumin 3.1 (*)    Total Bilirubin <0.1 (*)    All other components within normal limits  TROPONIN I    RADIOLOGY  Chest x-ray  IMPRESSION: No edema or consolidation. ____________________________________________  FINAL ASSESSMENT AND PLAN  Chest pain  Plan: Patient with labs and imaging as dictated above. Patient is in no acute distress, states she has run out of her Ativan. She has long through a 7 day period which she is sleeping very little at night which I think  is exacerbating her anxiety. This seems anxiety related, recent hospital admission and stress testing was normal. She'll be discharged with anxiety medicine and melatonin for sleep.   Earleen Newport, MD   Earleen Newport, MD 03/12/16 [phone]

## 2016-03-12 NOTE — ED Notes (Signed)
Pt to ned with c/o chest pain that started last night,  Reports hx of MI with stent placement.  Pt reports chest pain is on the left side of her chest and radiates into left arm. Also reports back pain and weakness.

## 2016-03-12 NOTE — Discharge Instructions (Signed)
Nonspecific Chest Pain It is often hard to find the cause of chest pain. There is always a chance that your pain could be related to something serious, such as a heart attack or a blood clot in your lungs. Chest pain can also be caused by conditions that are not life-threatening. If you have chest pain, it is very important to follow up with your doctor.  HOME CARE  If you were prescribed an antibiotic medicine, finish it all even if you start to feel better.  Avoid any activities that cause chest pain.  Do not use any tobacco products, including cigarettes, chewing tobacco, or electronic cigarettes. If you need help quitting, ask your doctor.  Do not drink alcohol.  Take medicines only as told by your doctor.  Keep all follow-up visits as told by your doctor. This is important. This includes any further testing if your chest pain does not go away.  Your doctor may tell you to keep your head raised (elevated) while you sleep.  Make lifestyle changes as told by your doctor. These may include:  Getting regular exercise. Ask your doctor to suggest some activities that are safe for you.  Eating a heart-healthy diet. Your doctor or a diet specialist (dietitian) can help you to learn healthy eating options.  Maintaining a healthy weight.  Managing diabetes, if necessary.  Reducing stress. GET HELP IF:  Your chest pain does not go away, even after treatment.  You have a rash with blisters on your chest.  You have a fever. GET HELP RIGHT AWAY IF:  Your chest pain is worse.  You have an increasing cough, or you cough up blood.  You have severe belly (abdominal) pain.  You feel extremely weak.  You pass out (faint).  You have chills.  You have sudden, unexplained chest discomfort.  You have sudden, unexplained discomfort in your arms, back, neck, or jaw.  You have shortness of breath at any time.  You suddenly start to sweat, or your skin gets clammy.  You feel  nauseous.  You vomit.  You suddenly feel light-headed or dizzy.  Your heart begins to beat quickly, or it feels like it is skipping beats. These symptoms may be an emergency. Do not wait to see if the symptoms will go away. Get medical help right away. Call your local emergency services (911 in the [place] Do not drive yourself to the hospital.   This information is not intended to replace advice given to you by your health care provider. Make sure you discuss any questions you have with your health care provider.   Document Released: 05/13/2008 Document Revised: 12/16/2014 Document Reviewed: 07/01/2014 Elsevier Interactive Patient Education 2016 Elsevier Inc.  Panic Attacks Panic attacks are sudden, short-livedsurges of severe anxiety, fear, or discomfort. They may occur for no reason when you are relaxed, when you are anxious, or when you are sleeping. Panic attacks may occur for a number of reasons:   Healthy people occasionally have panic attacks in extreme, life-threatening situations, such as war or natural disasters. Normal anxiety is a protective mechanism of the body that helps [place] react to danger (fight or flight response).  Panic attacks are often seen with anxiety disorders, such as panic disorder, social anxiety disorder, generalized anxiety disorder, and phobias. Anxiety disorders cause excessive or uncontrollable anxiety. They may interfere with your relationships or other life activities.  Panic attacks are sometimes seen with other mental illnesses, such as depression and posttraumatic stress disorder.  Certain medical  conditions, prescription medicines, and drugs of abuse can cause panic attacks. SYMPTOMS  Panic attacks start suddenly, peak within 20 minutes, and are accompanied by four or more of the following symptoms:  Pounding heart or fast heart rate (palpitations).  Sweating.  Trembling or shaking.  Shortness of breath or feeling smothered.  Feeling  choked.  Chest pain or discomfort.  Nausea or strange feeling in your stomach.  Dizziness, light-headedness, or feeling like you will faint.  Chills or hot flushes.  Numbness or tingling in your lips or hands and feet.  Feeling that things are not real or feeling that you are not yourself.  Fear of losing control or going crazy.  Fear of dying. Some of these symptoms can mimic serious medical conditions. For example, you may think you are having a heart attack. Although panic attacks can be very scary, they are not life threatening. DIAGNOSIS  Panic attacks are diagnosed through an assessment by your health care provider. Your health care provider will ask questions about your symptoms, such as where and when they occurred. Your health care provider will also ask about your medical history and use of alcohol and drugs, including prescription medicines. Your health care provider may order blood tests or other studies to rule out a serious medical condition. Your health care provider may refer you to a mental health professional for further evaluation. TREATMENT   Most healthy people who have one or two panic attacks in an extreme, life-threatening situation will not require treatment.  The treatment for panic attacks associated with anxiety disorders or other mental illness typically involves counseling with a mental health professional, medicine, or a combination of both. Your health care provider will help determine what treatment is best for you.  Panic attacks due to physical illness usually go away with treatment of the illness. If prescription medicine is causing panic attacks, talk with your health care provider about stopping the medicine, decreasing the dose, or substituting another medicine.  Panic attacks due to alcohol or drug abuse go away with abstinence. Some adults need professional help in order to stop drinking or using drugs. HOME CARE INSTRUCTIONS   Take all  medicines as directed by your health care provider.   Schedule and attend follow-up visits as directed by your health care provider. It is important to keep all your appointments. SEEK MEDICAL CARE IF:  You are not able to take your medicines as prescribed.  Your symptoms do not improve or get worse. SEEK IMMEDIATE MEDICAL CARE IF:   You experience panic attack symptoms that are different than your usual symptoms.  You have serious thoughts about hurting yourself or others.  You are taking medicine for panic attacks and have a serious side effect. MAKE SURE YOU:  Understand these instructions.  Will watch your condition.  Will get help right away if you are not doing well or get worse.   This information is not intended to replace advice given to you by your health care provider. Make sure you discuss any questions you have with your health care provider.   Document Released: 11/25/2005 Document Revised: 11/30/2013 Document Reviewed: 07/09/2013 Elsevier Interactive Patient Education 2016 [place].  Insomnia Insomnia is a sleep disorder that makes it difficult to fall asleep or to stay asleep. Insomnia can cause tiredness (fatigue), low energy, difficulty concentrating, mood swings, and poor performance at work or school.  There are three different ways to classify insomnia:  Difficulty falling asleep.  Difficulty staying asleep.  Waking up too early in the morning. Any type of insomnia can be long-term (chronic) or short-term (acute). Both are common. Short-term insomnia usually lasts for three months or less. Chronic insomnia occurs at least three times a week for longer than three months. CAUSES  Insomnia may be caused by another condition, situation, or substance, such as:  Anxiety.  Certain medicines.  Gastroesophageal reflux disease (GERD) or other gastrointestinal conditions.  Asthma or other breathing conditions.  Restless legs syndrome, sleep apnea, or  other sleep disorders.  Chronic pain.  Menopause. This may include hot flashes.  Stroke.  Abuse of alcohol, tobacco, or illegal drugs.  Depression.  Caffeine.   Neurological disorders, such as Alzheimer disease.  An overactive thyroid (hyperthyroidism). The cause of insomnia may not be known. RISK FACTORS Risk factors for insomnia include:  Gender. Women are more commonly affected than men.  Age. Insomnia is more common as you get older.  Stress. This may involve your professional or personal life.  Income. Insomnia is more common in people with lower income.  Lack of exercise.   Irregular work schedule or night shifts.  Traveling between different time zones. SIGNS AND SYMPTOMS If you have insomnia, trouble falling asleep or trouble staying asleep is the main symptom. This may lead to other symptoms, such as:  Feeling fatigued.  Feeling nervous about going to sleep.  Not feeling rested in the morning.  Having trouble concentrating.  Feeling irritable, anxious, or depressed. TREATMENT  Treatment for insomnia depends on the cause. If your insomnia is caused by an underlying condition, treatment will focus on addressing the condition. Treatment may also include:   Medicines to help you sleep.  Counseling or therapy.  Lifestyle adjustments. HOME CARE INSTRUCTIONS   Take medicines only as directed by your health care provider.  Keep regular sleeping and waking hours. Avoid naps.  Keep a sleep diary to help you and your health care provider figure out what could be causing your insomnia. Include:   When you sleep.  When you wake up during the night.  How well you sleep.   How rested you feel the next day.  Any side effects of medicines you are taking.  What you eat and drink.   Make your bedroom a comfortable place where it is easy to fall asleep:  Put up shades or special blackout curtains to block light from outside.  Use a white noise  machine to block noise.  Keep the temperature cool.   Exercise regularly as directed by your health care provider. Avoid exercising right before bedtime.  Use relaxation techniques to manage stress. Ask your health care provider to suggest some techniques that may work well for you. These may include:  Breathing exercises.  Routines to release muscle tension.  Visualizing peaceful scenes.  Cut back on alcohol, caffeinated beverages, and cigarettes, especially close to bedtime. These can disrupt your sleep.  Do not overeat or eat spicy foods right before bedtime. This can lead to digestive discomfort that can make it hard for you to sleep.  Limit screen use before bedtime. This includes:  Watching TV.  Using your smartphone, tablet, and computer.  Stick to a routine. This can help you fall asleep faster. Try to do a quiet activity, brush your teeth, and go to bed at the same time each night.  Get out of bed if you are still awake after 15 minutes of trying to sleep. Keep the lights down, but try reading or doing a  quiet activity. When you feel sleepy, go back to bed.  Make sure that you drive carefully. Avoid driving if you feel very sleepy.  Keep all follow-up appointments as directed by your health care provider. This is important. SEEK MEDICAL CARE IF:   You are tired throughout the day or have trouble in your daily routine due to sleepiness.  You continue to have sleep problems or your sleep problems get worse. SEEK IMMEDIATE MEDICAL CARE IF:   You have serious thoughts about hurting yourself or someone else.   This information is not intended to replace advice given to you by your health care provider. Make sure you discuss any questions you have with your health care provider.   Document Released: 11/22/2000 Document Revised: 08/16/2015 Document Reviewed: 08/26/2014 Elsevier Interactive Patient Education [employer].

## 2016-03-15 ENCOUNTER — Ambulatory Visit: Payer: Medicaid Other | Admitting: Unknown Physician Specialty

## 2016-03-19 ENCOUNTER — Ambulatory Visit: Payer: Medicaid Other | Attending: Family | Admitting: Family

## 2016-03-19 ENCOUNTER — Encounter: Payer: Self-pay | Admitting: Family

## 2016-03-19 VITALS — BP 120/74 | HR 93 | Resp 20 | Ht 64.0 in | Wt 198.0 lb

## 2016-03-19 DIAGNOSIS — E669 Obesity, unspecified: Secondary | ICD-10-CM | POA: Insufficient documentation

## 2016-03-19 DIAGNOSIS — R0602 Shortness of breath: Secondary | ICD-10-CM | POA: Diagnosis not present

## 2016-03-19 DIAGNOSIS — E785 Hyperlipidemia, unspecified: Secondary | ICD-10-CM | POA: Diagnosis not present

## 2016-03-19 DIAGNOSIS — I252 Old myocardial infarction: Secondary | ICD-10-CM | POA: Diagnosis not present

## 2016-03-19 DIAGNOSIS — Z87891 Personal history of nicotine dependence: Secondary | ICD-10-CM | POA: Insufficient documentation

## 2016-03-19 DIAGNOSIS — Z79899 Other long term (current) drug therapy: Secondary | ICD-10-CM | POA: Diagnosis not present

## 2016-03-19 DIAGNOSIS — I2510 Atherosclerotic heart disease of native coronary artery without angina pectoris: Secondary | ICD-10-CM | POA: Diagnosis not present

## 2016-03-19 DIAGNOSIS — I10 Essential (primary) hypertension: Secondary | ICD-10-CM | POA: Diagnosis not present

## 2016-03-19 DIAGNOSIS — R000 Tachycardia, unspecified: Secondary | ICD-10-CM | POA: Insufficient documentation

## 2016-03-19 DIAGNOSIS — D509 Iron deficiency anemia, unspecified: Secondary | ICD-10-CM | POA: Insufficient documentation

## 2016-03-19 DIAGNOSIS — Z9889 Other specified postprocedural states: Secondary | ICD-10-CM | POA: Insufficient documentation

## 2016-03-19 DIAGNOSIS — I5032 Chronic diastolic (congestive) heart failure: Secondary | ICD-10-CM | POA: Insufficient documentation

## 2016-03-19 DIAGNOSIS — R5383 Other fatigue: Secondary | ICD-10-CM | POA: Insufficient documentation

## 2016-03-19 DIAGNOSIS — N183 Chronic kidney disease, stage 3 unspecified: Secondary | ICD-10-CM

## 2016-03-19 DIAGNOSIS — R072 Precordial pain: Secondary | ICD-10-CM

## 2016-03-19 DIAGNOSIS — E119 Type 2 diabetes mellitus without complications: Secondary | ICD-10-CM | POA: Insufficient documentation

## 2016-03-19 DIAGNOSIS — I255 Ischemic cardiomyopathy: Secondary | ICD-10-CM | POA: Insufficient documentation

## 2016-03-19 DIAGNOSIS — Z794 Long term (current) use of insulin: Secondary | ICD-10-CM | POA: Diagnosis not present

## 2016-03-19 DIAGNOSIS — R079 Chest pain, unspecified: Secondary | ICD-10-CM | POA: Diagnosis not present

## 2016-03-19 DIAGNOSIS — E1122 Type 2 diabetes mellitus with diabetic chronic kidney disease: Secondary | ICD-10-CM

## 2016-03-19 DIAGNOSIS — N289 Disorder of kidney and ureter, unspecified: Secondary | ICD-10-CM | POA: Diagnosis not present

## 2016-03-19 DIAGNOSIS — Z7982 Long term (current) use of aspirin: Secondary | ICD-10-CM | POA: Insufficient documentation

## 2016-03-19 DIAGNOSIS — F419 Anxiety disorder, unspecified: Secondary | ICD-10-CM | POA: Insufficient documentation

## 2016-03-19 NOTE — Progress Notes (Signed)
Subjective:    Patient ID: [name] [name], female    DOB: 09-Feb-1981, 35 y.o.   MRN: [id number]  Congestive Heart Failure Presents for initial visit. The disease course has been stable. Associated symptoms include chest pain, fatigue and shortness of breath (at times). Pertinent negatives include no abdominal pain, edema, orthopnea or palpitations. The symptoms have been stable. Past treatments include angiotensin receptor blockers, beta blockers and salt and fluid restriction. The treatment provided moderate relief. Compliance with prior treatments has been good. Her past medical history is significant for anemia, CAD, DM and HTN.  Chest Pain  This is a recurrent problem. The current episode started more than 1 month ago. The onset quality is sudden. The problem occurs every several days. The problem has been unchanged. The pain is present in the epigastric region. The pain is at a severity of 8/10. The pain is moderate. The quality of the pain is described as pressure. The pain radiates to the left arm, epigastrium, mid back, left jaw, right arm and upper back. Associated symptoms include back pain, a cough (intermittently at night), headaches, shortness of breath (at times) and weakness (legs feel tired). Pertinent negatives include no abdominal pain, dizziness, palpitations or vomiting. The pain is aggravated by nothing. She has tried rest and nitroglycerin for the symptoms. The treatment provided moderate relief. Risk factors include hormone replacement therapy, lack of exercise and oral contraceptive use.  Her past medical history is significant for anxiety/panic attacks, CAD, CHF, diabetes, DM, hyperlipidemia, HTN, hypertension and MI.  Her family medical history is significant for diabetes. Prior diagnostic workup includes chest x-ray, echocardiogram and cardiac catherization.   Past Medical History  Diagnosis Date  . CAD (coronary artery disease)     a. NSTEMI 03/2015: mLCx 100% s/p PCI/DES,  RI 90% s/p PCI/DES, EF 35%; b. 04/2015 Relook Cath: LM nl, LAD 40p, RI patent stent, LCX patent stent, RCA nl, EF 55-65%.  . Chronic systolic CHF (congestive heart failure) ([place])     a. echo 03/2015: EF 30-35%, mild concentric LVH, severe HK of inf, inflat, & lat walls, mod MR, mild TR;  b. 04/2015 LV Gram: EF 55-65%.  . Ischemic cardiomyopathy     a. 03/2015 EF 30-35% post NSTEMI;  b. 04/2015 EF 55-65% on LV gram.  . DM2 (diabetes mellitus, type 2) ([place])     a. since 2002  . Tobacco abuse     a. quit 03/2015.  . Obesity   . Hyperlipidemia   . Hypotension     a. resulting in discontinuation of losartan and reduction in coreg dose.  Marland Kitchen Heavy menses     a. H/O IUD - expired in 2014 - remains in place.  . Iron deficiency anemia   . Kidney disease   . Uterine fibroid   . Hypertension   . MI (myocardial infarction) ([place]) 03/29/2015  . Renal insufficiency     Past Surgical History  Procedure Laterality Date  . Mouth surgery    . Cardiac catheterization  4/16    x2 stent [facility]  . Cardiac catheterization N/A 05/05/2015    Procedure: Left Heart Cath and Coronary Angiography;  Surgeon: Peter M Martinique, MD;  Location: [facility];  Service: Cardiovascular;  Laterality: N/A;    Family History  Problem Relation Age of Onset  . Diabetes Father   . Cancer Maternal Grandmother     lung  . Cancer Maternal Grandfather     prostate  . Diabetes Paternal Grandfather   .  Cancer - Cervical Maternal Aunt     Social History  Substance Use Topics  . Smoking status: Former Smoker -- 15 years    Quit date: 03/10/2015  . Smokeless tobacco: Never Used  . Alcohol Use: No    Allergies  Allergen Reactions  . Lisinopril Itching and Swelling  . Shellfish Allergy Itching and Swelling    Prior to Admission medications   Medication Sig Start Date End Date Taking? Authorizing Provider  aspirin EC 81 MG tablet Take 81 mg by mouth daily.   Yes Historical Provider, MD  atorvastatin (LIPITOR) 20 MG  tablet Take 1 tablet (20 mg total) by mouth daily. 01/22/16  Yes Wellington Hampshire, MD  carvedilol (COREG) 3.125 MG tablet Take 2 tablets (6.25 mg total) by mouth 2 (two) times daily. 03/06/16  Yes Kathrine Haddock, NP  etonogestrel (NEXPLANON) 68 MG IMPL implant 1 each by Subdermal route once.   Yes Historical Provider, MD  ferrous sulfate 325 (65 FE) MG tablet Take 1 tablet (325 mg total) by mouth 3 (three) times daily with meals. 03/06/16  Yes Kathrine Haddock, NP  glimepiride (AMARYL) 4 MG tablet Take 1 tablet (4 mg total) by mouth daily. 09/25/15  Yes Kathrine Haddock, NP  Insulin Glargine (LANTUS SOLOSTAR) 100 UNIT/ML Solostar Pen Inject 60 Units into the skin daily at 10 pm. Start with 20 units QHS.  Titrate to AM blood sugar Patient taking differently: Inject 20 Units into the skin daily at 10 pm. Start with 20 units QHS.  Titrate to AM blood sugar 03/06/16  Yes Kathrine Haddock, NP  loperamide (IMODIUM) 2 MG capsule Take 2 mg by mouth as needed for diarrhea or loose stools.   Yes Historical Provider, MD  LORazepam (ATIVAN) 1 MG tablet Take 1 tablet (1 mg total) by mouth 2 (two) times daily. 03/12/16 03/12/17 Yes Earleen Newport, MD  losartan (COZAAR) 50 MG tablet Take 1 tablet (50 mg total) by mouth daily. 10/23/15  Yes Wellington Hampshire, MD  Melatonin 3 MG TABS Take 1 tablet (3 mg total) by mouth at bedtime. 03/12/16  Yes Earleen Newport, MD  nitroGLYCERIN (NITROSTAT) 0.4 MG SL tablet Place 1 tablet (0.4 mg total) under the tongue every 5 (five) minutes as needed for chest pain. Reported on 02/20/2016 03/11/16  Yes Wellington Hampshire, MD  pantoprazole (PROTONIX) 40 MG tablet Take 1 tablet (40 mg total) by mouth daily at 6 (six) AM. 09/25/15  Yes Kathrine Haddock, NP  ticagrelor (BRILINTA) 90 MG TABS tablet Take 1 tablet (90 mg total) by mouth 2 (two) times daily. 09/09/15  Yes Isaiah Serge, NP       Review of Systems  Constitutional: Positive for fatigue. Negative for appetite change.  HENT: Positive for  rhinorrhea and sore throat. Negative for congestion.   Eyes: Negative.   Respiratory: Positive for cough (intermittently at night) and shortness of breath (at times). Negative for chest tightness.   Cardiovascular: Positive for chest pain. Negative for palpitations and leg swelling.  Gastrointestinal: Negative for vomiting, abdominal pain and abdominal distention.  Endocrine: Negative.   Genitourinary: Negative.   Musculoskeletal: Positive for back pain and neck pain.  Skin: Negative.   Allergic/Immunologic: Negative.   Neurological: Positive for weakness (legs feel tired), light-headedness and headaches. Negative for dizziness.  Hematological: Negative for adenopathy. Bruises/bleeds easily.  Psychiatric/Behavioral: Negative for sleep disturbance (sleeping on 3 pillows) and dysphoric mood. The patient is nervous/anxious.        Objective:  Physical Exam  Constitutional: She is oriented to person, place, and time. She appears well-developed and well-nourished.  HENT:  Head: Normocephalic and atraumatic.  Eyes: Conjunctivae are normal. Pupils are equal, round, and reactive to light.  Neck: Normal range of motion. Neck supple.  Cardiovascular: Regular rhythm.  Tachycardia present.   Pulmonary/Chest: Effort normal. She has no wheezes. She has no rales.  Abdominal: Soft. She exhibits no distension. There is no tenderness.  Musculoskeletal: She exhibits no edema or tenderness.  Neurological: She is alert and oriented to person, place, and time.  Skin: Skin is warm and dry.  Psychiatric: She has a normal mood and affect. Her behavior is normal. Thought content normal.  Nursing note and vitals reviewed.   BP 120/74 mmHg  Pulse 93  Resp 20  Ht 5\' 4"  (1.626 m)  Wt 198 lb (89.812 kg)  BMI 33.97 kg/m2  SpO2 100%  LMP 03/09/2016       Assessment & Plan:  1: Chronic heart failure with preserved ejection fraction- Patient presents with fatigue upon exertion and occasional shortness of  breath upon exertion (Class II). She denies any swelling in her legs or abdomen. She hasn't been weighing herself because she doesn't have any scales so a set of scales was given to her. She was instructed to weigh herself daily every morning and to call for an overnight weight gain of >2 pounds or a weekly weight gain of >5 pounds. She occasionally adds "a pinch" of salt to some foods and she was encouraged to not add any salt to her food. Discussed the importance of following a 2000mg  sodium diet and written information was given to her about this. Hernandez PhamD went in and reviewed medications with the patient.  2: Tachycardia- Patient admits that she's nervous. Discussed increasing her carvedilol in the future if her heartrate remains elevated.  3: Chest pain- She says that she often gets chest pain that sometimes radiates down her arm, into her jaw or back but other times doesn't radiate. She describes it as a pressure which is usually relieved by taking 1-3 SL NTG. She admits getting anxious when she starts to feel the pain which she then feels like makes the pain worse. Following closely with cardiologist regarding this. 4: Diabetes- She has recently been started on insulin and is taking 20 units nightly. She says that her glucose yesterday was 171 which she reports as much improved from the mid-200's that she was in. Following closely with her PCP regarding this. She says that she was referred to the [facility] for diabetes education but wasn't able to start. Encouraged her to call them back to get into the classes. Phone number was provided to her.   Patient did not bring her medications nor a list. Each medication was verbally reviewed with the patient and she was encouraged to bring the bottles to every visit to confirm accuracy of list.    Return here in 1 month or sooner for any questions/problems before then.

## 2016-03-19 NOTE — Patient Instructions (Addendum)
Begin weighing daily and call for an overnight weight gain of > 2 pounds or a weekly weight gain of >5 pounds.  [name] [name]

## 2016-03-25 ENCOUNTER — Emergency Department
Admission: EM | Admit: 2016-03-25 | Discharge: 2016-03-25 | Disposition: A | Discharge status: Home / Self Care | Payer: Medicaid Other | Attending: Emergency Medicine | Admitting: Emergency Medicine

## 2016-03-25 ENCOUNTER — Other Ambulatory Visit: Payer: Self-pay | Admitting: Family

## 2016-03-25 ENCOUNTER — Other Ambulatory Visit: Payer: Self-pay | Admitting: Unknown Physician Specialty

## 2016-03-25 ENCOUNTER — Telehealth: Payer: Self-pay | Admitting: Cardiovascular Disease

## 2016-03-25 ENCOUNTER — Emergency Department: Payer: Medicaid Other

## 2016-03-25 ENCOUNTER — Encounter: Payer: Self-pay | Admitting: Emergency Medicine

## 2016-03-25 DIAGNOSIS — E669 Obesity, unspecified: Secondary | ICD-10-CM | POA: Diagnosis not present

## 2016-03-25 DIAGNOSIS — N189 Chronic kidney disease, unspecified: Secondary | ICD-10-CM | POA: Diagnosis not present

## 2016-03-25 DIAGNOSIS — I130 Hypertensive heart and chronic kidney disease with heart failure and stage 1 through stage 4 chronic kidney disease, or unspecified chronic kidney disease: Secondary | ICD-10-CM | POA: Diagnosis not present

## 2016-03-25 DIAGNOSIS — I252 Old myocardial infarction: Secondary | ICD-10-CM | POA: Diagnosis not present

## 2016-03-25 DIAGNOSIS — I25119 Atherosclerotic heart disease of native coronary artery with unspecified angina pectoris: Secondary | ICD-10-CM | POA: Insufficient documentation

## 2016-03-25 DIAGNOSIS — R0789 Other chest pain: Secondary | ICD-10-CM | POA: Diagnosis not present

## 2016-03-25 DIAGNOSIS — Z87891 Personal history of nicotine dependence: Secondary | ICD-10-CM | POA: Insufficient documentation

## 2016-03-25 DIAGNOSIS — E785 Hyperlipidemia, unspecified: Secondary | ICD-10-CM | POA: Insufficient documentation

## 2016-03-25 DIAGNOSIS — I5042 Chronic combined systolic (congestive) and diastolic (congestive) heart failure: Secondary | ICD-10-CM | POA: Insufficient documentation

## 2016-03-25 DIAGNOSIS — Z79899 Other long term (current) drug therapy: Secondary | ICD-10-CM | POA: Insufficient documentation

## 2016-03-25 DIAGNOSIS — Z794 Long term (current) use of insulin: Secondary | ICD-10-CM | POA: Insufficient documentation

## 2016-03-25 DIAGNOSIS — Z7982 Long term (current) use of aspirin: Secondary | ICD-10-CM | POA: Insufficient documentation

## 2016-03-25 DIAGNOSIS — R079 Chest pain, unspecified: Secondary | ICD-10-CM | POA: Diagnosis present

## 2016-03-25 DIAGNOSIS — E119 Type 2 diabetes mellitus without complications: Secondary | ICD-10-CM | POA: Insufficient documentation

## 2016-03-25 LAB — BASIC METABOLIC PANEL
Anion gap: 4 — ABNORMAL LOW (ref 5–15)
BUN: 25 mg/dL — ABNORMAL HIGH (ref 6–20)
CO2: 22 mmol/L (ref 22–32)
Calcium: 8.3 mg/dL — ABNORMAL LOW (ref 8.9–10.3)
Chloride: 111 mmol/L (ref 101–111)
Creatinine, Ser: 1.04 mg/dL — ABNORMAL HIGH (ref 0.44–1.00)
GFR calc Af Amer: >60 mL/min (ref >60)
GFR calc non Af Amer: >60 mL/min (ref >60)
Glucose, Bld: 174 mg/dL — ABNORMAL HIGH (ref 65–99)
Potassium: 3.7 mmol/L (ref 3.5–5.1)
Sodium: 137 mmol/L (ref 135–145)

## 2016-03-25 LAB — FIBRIN DERIVATIVES D-DIMER (ARMC ONLY): Fibrin derivatives D-dimer (ARMC): 400 (ref 0–499)

## 2016-03-25 LAB — CBC
HCT: 33.3 % — ABNORMAL LOW (ref 35.0–47.0)
Hemoglobin: 11 g/dL — ABNORMAL LOW (ref 12.0–16.0)
MCH: 26.7 pg (ref 26.0–34.0)
MCHC: 32.9 g/dL (ref 32.0–36.0)
MCV: 81.1 fL (ref 80.0–100.0)
Platelets: 289 10*3/uL (ref 150–440)
RBC: 4.11 MIL/uL (ref 3.80–5.20)
RDW: 16.6 % — ABNORMAL HIGH (ref 11.5–14.5)
WBC: 6 10*3/uL (ref 3.6–11.0)

## 2016-03-25 LAB — TROPONIN I
Troponin I: <0.03 ng/mL (ref <0.031)
Troponin I: <0.03 ng/mL (ref <0.031)

## 2016-03-25 MED ORDER — ASPIRIN 81 MG PO CHEW
324.0000 mg | CHEWABLE_TABLET | Freq: Once | ORAL | Status: AC
  Administered 2016-03-25: 324 mg via ORAL
  Filled 2016-03-25: qty 4

## 2016-03-25 MED ORDER — PANTOPRAZOLE SODIUM 40 MG PO TBEC: 40.0000 mg | DELAYED_RELEASE_TABLET | Freq: Every day | ORAL | Status: DC

## 2016-03-25 MED ORDER — GLUCOSE BLOOD VI STRP: ORAL_STRIP | Status: DC

## 2016-03-25 MED ORDER — CARVEDILOL 6.25 MG PO TABS: 6.2500 mg | ORAL_TABLET | Freq: Two times a day (BID) | ORAL | Status: DC

## 2016-03-25 MED ORDER — GLIMEPIRIDE 4 MG PO TABS: 4.0000 mg | ORAL_TABLET | Freq: Every day | ORAL | Status: DC

## 2016-03-25 NOTE — Telephone Encounter (Signed)
LMOV to call back and schedule an ED fu appointment on 03/25/16 Seen for CP

## 2016-03-25 NOTE — Telephone Encounter (Signed)
Called and left patient a voicemail asking for her to please return my call. I was going to ask her what test strips she has been using in the past.

## 2016-03-25 NOTE — Telephone Encounter (Signed)
Routing to provider. Patient was seen 03/06/16.

## 2016-03-25 NOTE — ED Provider Notes (Signed)
[facility] Emergency Department Provider Note  ____________________________________________  Time seen: Approximately [id number] AM  I have reviewed the triage vital signs and the nursing notes.   HISTORY  Chief Complaint Chest Pain    HPI [name] [name] is a 35 y.o. female who comes into the hospital with sharp pains in her chest. She reports this started around 9 PM when she was laying down. The patient reports that she took a Nitrostat and the pain went away. She reports the pain returned around 10 PM and did not go away so she came into the hospital. She reports that she does have some tenderness in her right chest and sometimes is also in the left side. She denies shortness of breath denies any sweats. She's had a dry cough for the past couple of days. She reports that she is not hurting currently but she occasionally has some pain when she takes a deep breath. The patient was diagnosed with MI last year in April. The patient also has some ischemic cardiomyopathy. The patient is here for evaluation.   Past Medical History  Diagnosis Date  . CAD (coronary artery disease)     a. NSTEMI 03/2015: mLCx 100% s/p PCI/DES, RI 90% s/p PCI/DES, EF 35%; b. 04/2015 Relook Cath: LM nl, LAD 40p, RI patent stent, LCX patent stent, RCA nl, EF 55-65%.  . Chronic systolic CHF (congestive heart failure) ([place])     a. echo 03/2015: EF 30-35%, mild concentric LVH, severe HK of inf, inflat, & lat walls, mod MR, mild TR;  b. 04/2015 LV Gram: EF 55-65%.  . Ischemic cardiomyopathy     a. 03/2015 EF 30-35% post NSTEMI;  b. 04/2015 EF 55-65% on LV gram.  . DM2 (diabetes mellitus, type 2) ([place])     a. since 2002  . Tobacco abuse     a. quit 03/2015.  . Obesity   . Hyperlipidemia   . Hypotension     a. resulting in discontinuation of losartan and reduction in coreg dose.  Marland Kitchen Heavy menses     a. H/O IUD - expired in 2014 - remains in place.  . Iron deficiency anemia   . Kidney disease   .  Uterine fibroid   . Hypertension   . MI (myocardial infarction) ([place]) 03/29/2015  . Renal insufficiency     Patient Active Problem List   Diagnosis Date Noted  . Chronic diastolic heart failure ([place]) 03/19/2016  . Tachycardia 03/19/2016  . IBS (irritable bowel syndrome) 12/15/2015  . Proteinuria 08/30/2015  . Coronary artery disease involving native coronary artery of native heart with angina pectoris with documented spasm ([place])   . Angina pectoris ([place])   . Iron deficiency anemia   . Heavy menses   . SOB (shortness of breath) 08/21/2015  . Chest pain 05/05/2015  . CAD in native artery 05/05/2015  . Hyperlipidemia 04/13/2015  . Chronic systolic CHF (congestive heart failure) ([place])   . Ischemic cardiomyopathy   . DM2 (diabetes mellitus, type 2) ([place])   . Tobacco abuse   . Obesity     Past Surgical History  Procedure Laterality Date  . Mouth surgery    . Cardiac catheterization  4/16    x2 stent [facility]  . Cardiac catheterization N/A 05/05/2015    Procedure: Left Heart Cath and Coronary Angiography;  Surgeon: Peter M Martinique, MD;  Location: [facility];  Service: Cardiovascular;  Laterality: N/A;    Current Outpatient Rx  Name  Route  Sig  Dispense  Refill  . aspirin EC 81 MG tablet   Oral   Take 81 mg by mouth daily.         Marland Kitchen atorvastatin (LIPITOR) 20 MG tablet   Oral   Take 1 tablet (20 mg total) by mouth daily.   30 tablet   6   . carvedilol (COREG) 3.125 MG tablet   Oral   Take 2 tablets (6.25 mg total) by mouth 2 (two) times daily.   60 tablet   3     Do not fill now. Pt will call when ready   . etonogestrel (NEXPLANON) 68 MG IMPL implant   Subdermal   1 each by Subdermal route once.         . ferrous sulfate 325 (65 FE) MG tablet   Oral   Take 1 tablet (325 mg total) by mouth 3 (three) times daily with meals.   90 tablet   3   . glimepiride (AMARYL) 4 MG tablet   Oral   Take 1 tablet (4 mg total) by mouth daily.   90 tablet   1   .  Insulin Glargine (LANTUS SOLOSTAR) 100 UNIT/ML Solostar Pen   Subcutaneous   Inject 60 Units into the skin daily at 10 pm. Start with 20 units QHS.  Titrate to AM blood sugar Patient taking differently: Inject 20 Units into the skin daily at 10 pm. Start with 20 units QHS.  Titrate to AM blood sugar   5 pen   PRN   . loperamide (IMODIUM) 2 MG capsule   Oral   Take 2 mg by mouth as needed for diarrhea or loose stools.         Marland Kitchen LORazepam (ATIVAN) 1 MG tablet   Oral   Take 1 tablet (1 mg total) by mouth 2 (two) times daily.   60 tablet   0   . losartan (COZAAR) 50 MG tablet   Oral   Take 1 tablet (50 mg total) by mouth daily.   30 tablet   3   . Melatonin 3 MG TABS   Oral   Take 1 tablet (3 mg total) by mouth at bedtime.   30 tablet   0   . nitroGLYCERIN (NITROSTAT) 0.4 MG SL tablet   Sublingual   Place 1 tablet (0.4 mg total) under the tongue every 5 (five) minutes as needed for chest pain. Reported on 02/20/2016   25 tablet   3   . pantoprazole (PROTONIX) 40 MG tablet   Oral   Take 1 tablet (40 mg total) by mouth daily at 6 (six) AM.   90 tablet   1   . ticagrelor (BRILINTA) 90 MG TABS tablet   Oral   Take 1 tablet (90 mg total) by mouth 2 (two) times daily.   60 tablet   6     Allergies Lisinopril and Shellfish allergy  Family History  Problem Relation Age of Onset  . Diabetes Father   . Cancer Maternal Grandmother     lung  . Cancer Maternal Grandfather     prostate  . Diabetes Paternal Grandfather   . Cancer - Cervical Maternal Aunt     Social History Social History  Substance Use Topics  . Smoking status: Former Smoker -- 15 years    Quit date: 03/10/2015  . Smokeless tobacco: Never Used  . Alcohol Use: No    Review of Systems Constitutional: No fever/chills Eyes: No visual changes. ENT:  No sore throat. Cardiovascular:  chest pain. Respiratory: Denies shortness of breath. Gastrointestinal: No abdominal pain.  No nausea, no vomiting.   No diarrhea.  No constipation. Genitourinary: Negative for dysuria. Musculoskeletal: Chest tenderness Skin: Negative for rash. Neurological: Negative for headaches, focal weakness or numbness.  10-point ROS otherwise negative.  ____________________________________________   PHYSICAL EXAM:  VITAL SIGNS: ED Triage Vitals  Enc Vitals Group     BP 03/25/16 0153 140/85 mmHg     Pulse Rate 03/25/16 0153 100     Resp 03/25/16 0153 18     Temp 03/25/16 0153 98.7 F (37.1 C)     Temp Source 03/25/16 0153 Oral     SpO2 03/25/16 0153 100 %     Weight 03/25/16 0153 199 lb (90.266 kg)     Height 03/25/16 0153 5\' 4"  (1.626 m)     Head Cir --      Peak Flow --      Pain Score 03/25/16 0153 4     Pain Loc --      Pain Edu? --      Excl. in [place]? --     Constitutional: Alert and oriented. Well appearing and in mild distress. Eyes: Conjunctivae are normal. PERRL. EOMI. Head: Atraumatic. Nose: No congestion/rhinnorhea. Mouth/Throat: Mucous membranes are moist.  Oropharynx non-erythematous. Cardiovascular: Normal rate, regular rhythm. Grossly normal heart sounds.  Good peripheral circulation. Respiratory: Normal respiratory effort.  No retractions. Lungs CTAB. Gastrointestinal: Soft and nontender. No distention. Positive bowel sounds Musculoskeletal: No lower extremity tenderness nor edema.   Neurologic:  Normal speech and language.  Skin:  Skin is warm, dry and intact.  Psychiatric: Mood and affect are normal.   ____________________________________________   LABS (all labs ordered are listed, but only abnormal results are displayed)  Labs Reviewed  BASIC METABOLIC PANEL - Abnormal; Notable for the following:    Glucose, Bld 174 (*)    BUN 25 (*)    Creatinine, Ser 1.04 (*)    Calcium 8.3 (*)    Anion gap 4 (*)    All other components within normal limits  CBC - Abnormal; Notable for the following:    Hemoglobin 11.0 (*)    HCT 33.3 (*)    RDW 16.6 (*)    All other components  within normal limits  TROPONIN I  TROPONIN I  FIBRIN DERIVATIVES D-DIMER ([facility] ONLY)   ____________________________________________  EKG  ED ECG REPORT I, Loney Hering, the attending physician, personally viewed and interpreted this ECG.   Date: 03/25/2016  EKG Time: 155  Rate: 99  Rhythm: normal sinus rhythm  Axis: normal  Intervals:none  ST&T Change: normal   ED ECG REPORT #2 I, Loney Hering, the attending physician, personally viewed and interpreted this ECG.   Date: 03/25/2016  EKG Time: 450  Rate: 88  Rhythm: normal  Axis: normal  Intervals:none  ST&T Change: none  ____________________________________________  RADIOLOGY  CXR: No active cardiopulmonary disease ____________________________________________   PROCEDURES  Procedure(s) performed: None  Critical Care performed: No  ____________________________________________   INITIAL IMPRESSION / ASSESSMENT AND PLAN / ED COURSE  Pertinent labs & imaging results that were available during my care of the patient were reviewed by me and considered in my medical decision making (see chart for details).  This is a 35 year old female who comes into the hospital today with some chest pain. The patient was seen on April 4 with chest pain as well and it was determined to be  due to anxiety. I did do 2 troponins and they were negative but when back to the patient's room she was having some pain again. I will do a d-dimer just to ensure that the patient is not having any other causes of her chest pain. If everything looks good I will discharge her to follow-up with Dr. Fletcher Anon  The patient's care will be signed out to Dr. Corky Downs who will follow-up the results of her d-dimer and determine further evaluation. ____________________________________________   FINAL CLINICAL IMPRESSION(S) / ED DIAGNOSES  Final diagnoses:  Chest pain, unspecified chest pain type      Loney Hering, MD 03/25/16 [id number]

## 2016-03-25 NOTE — Telephone Encounter (Signed)
Patient needs test strips send to her pharmacy [facility] on [address] thanks.

## 2016-03-25 NOTE — ED Notes (Signed)
Pt reports sharp CP at 9 pm last night, pt took nitro with relief, Pain returned at 10 pm and pt took another nitro without relief. Pt now reports on and off pain. Pt in no acute distress at this time

## 2016-03-25 NOTE — Discharge Instructions (Signed)
Nonspecific Chest Pain  °Chest pain can be caused by many different conditions. There is always a chance that your pain could be related to something serious, such as a heart attack or a blood clot in your lungs. Chest pain can also be caused by conditions that are not life-threatening. If you have chest pain, it is very important to follow up with your health care provider. °CAUSES  °Chest pain can be caused by: °· Heartburn. °· Pneumonia or bronchitis. °· Anxiety or stress. °· Inflammation around your heart (pericarditis) or lung (pleuritis or pleurisy). °· A blood clot in your lung. °· A collapsed lung (pneumothorax). It can develop suddenly on its own (spontaneous pneumothorax) or from trauma to the chest. °· Shingles infection (varicella-zoster virus). °· Heart attack. °· Damage to the bones, muscles, and cartilage that make up your chest wall. This can include: °¨ Bruised bones due to injury. °¨ Strained muscles or cartilage due to frequent or repeated coughing or overwork. °¨ Fracture to one or more ribs. °¨ Sore cartilage due to inflammation (costochondritis). °RISK FACTORS  °Risk factors for chest pain may include: °· Activities that increase your risk for trauma or injury to your chest. °· Respiratory infections or conditions that cause frequent coughing. °· Medical conditions or overeating that can cause heartburn. °· Heart disease or family history of heart disease. °· Conditions or health behaviors that increase your risk of developing a blood clot. °· Having had chicken pox (varicella zoster). °SIGNS AND SYMPTOMS °Chest pain can feel like: °· Burning or tingling on the surface of your chest or deep in your chest. °· Crushing, pressure, aching, or squeezing pain. °· Dull or sharp pain that is worse when you move, cough, or take a deep breath. °· Pain that is also felt in your back, neck, shoulder, or arm, or pain that spreads to any of these areas. °Your chest pain may come and go, or it may stay  constant. °DIAGNOSIS °Lab tests or other studies may be needed to find the cause of your pain. Your health care provider may have you take a test called an ambulatory ECG (electrocardiogram). An ECG records your heartbeat patterns at the time the test is performed. You may also have other tests, such as: °· Transthoracic echocardiogram (TTE). During echocardiography, sound waves are used to create a picture of all of the heart structures and to look at how blood flows through your heart. °· Transesophageal echocardiogram (TEE). This is a more advanced imaging test that obtains images from inside your body. It allows your health care provider to see your heart in finer detail. °· Cardiac monitoring. This allows your health care provider to monitor your heart rate and rhythm in real time. °· Holter monitor. This is a portable device that records your heartbeat and can help to diagnose abnormal heartbeats. It allows your health care provider to track your heart activity for several days, if needed. °· Stress tests. These can be done through exercise or by taking medicine that makes your heart beat more quickly. °· Blood tests. °· Imaging tests. °TREATMENT  °Your treatment depends on what is causing your chest pain. Treatment may include: °· Medicines. These may include: °¨ Acid blockers for heartburn. °¨ Anti-inflammatory medicine. °¨ Pain medicine for inflammatory conditions. °¨ Antibiotic medicine, if an infection is present. °¨ Medicines to dissolve blood clots. °¨ Medicines to treat coronary artery disease. °· Supportive care for conditions that do not require medicines. This may include: °¨ Resting. °¨ Applying heat   or cold packs to injured areas. °¨ Limiting activities until pain decreases. °HOME CARE INSTRUCTIONS °· If you were prescribed an antibiotic medicine, finish it all even if you start to feel better. °· Avoid any activities that bring on chest pain. °· Do not use any tobacco products, including  cigarettes, chewing tobacco, or electronic cigarettes. If you need help quitting, ask your health care provider. °· Do not drink alcohol. °· Take medicines only as directed by your health care provider. °· Keep all follow-up visits as directed by your health care provider. This is important. This includes any further testing if your chest pain does not go away. °· If heartburn is the cause for your chest pain, you may be told to keep your head raised (elevated) while sleeping. This reduces the chance that acid will go from your stomach into your esophagus. °· Make lifestyle changes as directed by your health care provider. These may include: °¨ Getting regular exercise. Ask your health care provider to suggest some activities that are safe for you. °¨ Eating a heart-healthy diet. A registered dietitian can help you to learn healthy eating options. °¨ Maintaining a healthy weight. °¨ Managing diabetes, if necessary. °¨ Reducing stress. °SEEK MEDICAL CARE IF: °· Your chest pain does not go away after treatment. °· You have a rash with blisters on your chest. °· You have a fever. °SEEK IMMEDIATE MEDICAL CARE IF:  °· Your chest pain is worse. °· You have an increasing cough, or you cough up blood. °· You have severe abdominal pain. °· You have severe weakness. °· You faint. °· You have chills. °· You have sudden, unexplained chest discomfort. °· You have sudden, unexplained discomfort in your arms, back, neck, or jaw. °· You have shortness of breath at any time. °· You suddenly start to sweat, or your skin gets clammy. °· You feel nauseous or you vomit. °· You suddenly feel light-headed or dizzy. °· Your heart begins to beat quickly, or it feels like it is skipping beats. °These symptoms may represent a serious problem that is an emergency. Do not wait to see if the symptoms will go away. Get medical help right away. Call your local emergency services (911 in the [place] Do not drive yourself to the hospital. °  °This  information is not intended to replace advice given to you by your health care provider. Make sure you discuss any questions you have with your health care provider. °  °Document Released: 09/04/2005 Document Revised: 12/16/2014 Document Reviewed: 07/01/2014 °Elsevier Interactive Patient Education ©2016 [employer] ° °

## 2016-03-25 NOTE — Telephone Encounter (Signed)
Pt called back stated she needs strips for Accu Check Aviva Plus. Pt also needs glimeipiride and pantoprozole. Pharm is [facility] on [address] in [place]. Thanks.

## 2016-03-25 NOTE — ED Notes (Signed)
Pt c/o sharp pain to right side of her chest that started at 8pm last night; took 2 Nitrostat which relieved her pain; pt says pain started again around 1030 and took another nitrostat; says this time pain was not relieved; history of MI last year; pt talking in complete coherent sentences;

## 2016-03-25 NOTE — Telephone Encounter (Signed)
Pt thinks its an Aveva Plus? Pt stated she is not home at the moment so she is not sure. Pt will call back when she has returned home to verify this information is accurate. Thanks.

## 2016-03-27 ENCOUNTER — Telehealth: Payer: Self-pay | Admitting: Unknown Physician Specialty

## 2016-03-27 ENCOUNTER — Other Ambulatory Visit: Payer: Self-pay | Admitting: Unknown Physician Specialty

## 2016-03-27 MED ORDER — BLOOD GLUCOSE MONITOR KIT: PACK | Status: DC

## 2016-03-27 NOTE — Telephone Encounter (Signed)
Pharmacy sent fax, we filled out prescription for meter, strips and lancets, and faxed back to the pharmacy.

## 2016-03-27 NOTE — Telephone Encounter (Signed)
Called pharmacy to confirm with them that they got the prescription we faxed and they stated they were working on it now.

## 2016-03-27 NOTE — Telephone Encounter (Signed)
Patient returned call. She stated that [facility] sent [place] a fax about a new meter and test strips prescription since the strips she was using are not being covered anymore. I apologized to the patient because I have not seen anything about a new meter. I told the patient that I was going to call [facility] to see what was going on.

## 2016-03-27 NOTE — Telephone Encounter (Signed)
Called pharmacy and explained what was going on. They stated that they sent [place] the fax about the meter and test strips and I told them that I did not get it. The lady I spoke to stated that she would re-fax the paper now.

## 2016-03-27 NOTE — Telephone Encounter (Signed)
Pt called stated she talked with [place] a few days ago, stating that [facility] would be sending a fax for a new meter. Pt stated the strips for the current meter she has have been discontinued. Please contact pt ASAP. Also, please send RX for a new meter and test strips to [facility] on [address]. Pt stated she has been calling for several days now. Please follow up ASAP. Thanks.

## 2016-03-27 NOTE — Telephone Encounter (Signed)
Called and left patient a voicemail letting her know that prescription for meter and strips were faxed to [facility] just before lunch. I let her know that I just called to confirm that they got those prescriptions and that they said they were working on it for her now. I asked for her to please give [place] a call if she has any questions or concerns.

## 2016-03-27 NOTE — Telephone Encounter (Signed)
Called and left patient a voicemail asking for her to please return my call.  

## 2016-03-29 ENCOUNTER — Encounter: Payer: Self-pay | Admitting: Cardiovascular Disease

## 2016-03-29 ENCOUNTER — Ambulatory Visit (INDEPENDENT_AMBULATORY_CARE_PROVIDER_SITE_OTHER): Payer: Medicaid Other | Admitting: Cardiovascular Disease

## 2016-03-29 VITALS — BP 110/72 | HR 88 | Ht 64.0 in | Wt 200.2 lb

## 2016-03-29 DIAGNOSIS — I5022 Chronic systolic (congestive) heart failure: Secondary | ICD-10-CM | POA: Diagnosis not present

## 2016-03-29 DIAGNOSIS — I2510 Atherosclerotic heart disease of native coronary artery without angina pectoris: Secondary | ICD-10-CM

## 2016-03-29 DIAGNOSIS — I255 Ischemic cardiomyopathy: Secondary | ICD-10-CM

## 2016-03-29 NOTE — Progress Notes (Signed)
Cardiology Office Note   Date:  03/29/2016   ID:  [name] [name], [name] [name] [birth year], MRN [id number]  PCP:  Kathrine Haddock, NP  Cardiologist:   Kathlyn Sacramento, MD   Chief Complaint  Patient presents with  . other    Follow up from [facility]; chest pain. Meds reviewed by the patient verbally. Pt. c/o having chest pain now with on a scale from 1-10 being a 3.       History of Present Illness: [name] [name] is a 35 y.o. female who presents for a follow-up visit regarding coronary artery disease and chronic systolic heart failure.  She has prolonged history of type 2 diabetes since 2002 , prolonged history of tobacco use, and obesity.  She presented to [facility] in April of 2016 with NSTEMI. Echocardiogram showed an ejection fraction of 30% with severe inferior, inferolateral and lateral wall hypokinesis with moderate mitral regurgitation. Cardiac catheterization showed occluded mid left circumflex with significant disease affecting the proximal OM1. There was mild disease affecting the LAD and minor irregularities in the RCA. Ejection fraction was 35%. I performed successful angioplasty and drug-eluting stent placement to the mid LCX and proximal OM . She underwent a repeat cardiac catheterization in May 2016 which showed patent stents in the left circumflex and OM1. Ejection fraction was 50-55%.  She was hospitalized at [facility] in March for chest pain thought to be due to panic attacks. She ruled out for myocardial infarction. She underwent a nuclear stress test showed evidence of prior left circumflex infarct without ischemia with normal ejection fraction. She has been doing better but she continues to be stressed. She continues to have intermittent chest pain. Interestingly, she presented with her myocardial infarction she did not have any chest pain in her symptoms manifested as shortness of breath. Her current chest pain response to Ativan and occasionally nitroglycerin.   Past Medical History    Diagnosis Date  . CAD (coronary artery disease)     a. NSTEMI 03/2015: mLCx 100% s/p PCI/DES, RI 90% s/p PCI/DES, EF 35%; b. 04/2015 Relook Cath: LM nl, LAD 40p, RI patent stent, LCX patent stent, RCA nl, EF 55-65%.  . Chronic systolic CHF (congestive heart failure) ([place])     a. echo 03/2015: EF 30-35%, mild concentric LVH, severe HK of inf, inflat, & lat walls, mod MR, mild TR;  b. 04/2015 LV Gram: EF 55-65%.  . Ischemic cardiomyopathy     a. 03/2015 EF 30-35% post NSTEMI;  b. 04/2015 EF 55-65% on LV gram.  . DM2 (diabetes mellitus, type 2) ([place])     a. since 2002  . Tobacco abuse     a. quit 03/2015.  . Obesity   . Hyperlipidemia   . Hypotension     a. resulting in discontinuation of losartan and reduction in coreg dose.  Marland Kitchen Heavy menses     a. H/O IUD - expired in 2014 - remains in place.  . Iron deficiency anemia   . Kidney disease   . Uterine fibroid   . Hypertension   . MI (myocardial infarction) ([place]) 03/29/2015  . Renal insufficiency     Past Surgical History  Procedure Laterality Date  . Mouth surgery    . Cardiac catheterization  4/16    x2 stent [facility]  . Cardiac catheterization N/A 05/05/2015    Procedure: Left Heart Cath and Coronary Angiography;  Surgeon: Peter M Martinique, MD;  Location: [place] [facility];  Service: Cardiovascular;  Laterality: N/A;  Current Outpatient Prescriptions  Medication Sig Dispense Refill  . aspirin EC 81 MG tablet Take 81 mg by mouth daily.    Marland Kitchen atorvastatin (LIPITOR) 20 MG tablet Take 1 tablet (20 mg total) by mouth daily. 30 tablet 6  . blood glucose meter kit and supplies KIT Dispense based on patient and insurance preference. Use up to four times daily as directed. E11.22. 1 each 0  . carvedilol (COREG) 6.25 MG tablet Take 1 tablet (6.25 mg total) by mouth 2 (two) times daily. 60 tablet 3  . etonogestrel (NEXPLANON) 68 MG IMPL implant 1 each by Subdermal route once.    . ferrous sulfate 325 (65 FE) MG tablet Take 1 tablet (325 mg total)  by mouth 3 (three) times daily with meals. 90 tablet 3  . glimepiride (AMARYL) 4 MG tablet Take 1 tablet (4 mg total) by mouth daily. 90 tablet 1  . glucose blood (ACCU-CHEK ACTIVE STRIPS) test strip Use as instructed 100 each 12  . Insulin Glargine (LANTUS SOLOSTAR) 100 UNIT/ML Solostar Pen Inject 60 Units into the skin daily at 10 pm. Start with 20 units QHS.  Titrate to AM blood sugar (Patient taking differently: Inject 20 Units into the skin daily at 10 pm. Start with 20 units QHS.  Titrate to AM blood sugar) 5 pen PRN  . loperamide (IMODIUM) 2 MG capsule Take 2 mg by mouth as needed for diarrhea or loose stools.    Marland Kitchen LORazepam (ATIVAN) 1 MG tablet Take 1 tablet (1 mg total) by mouth 2 (two) times daily. 60 tablet 0  . losartan (COZAAR) 50 MG tablet Take 1 tablet (50 mg total) by mouth daily. 30 tablet 3  . Melatonin 3 MG TABS Take 1 tablet (3 mg total) by mouth at bedtime. 30 tablet 0  . nitroGLYCERIN (NITROSTAT) 0.4 MG SL tablet Place 1 tablet (0.4 mg total) under the tongue every 5 (five) minutes as needed for chest pain. Reported on 02/20/2016 25 tablet 3  . pantoprazole (PROTONIX) 40 MG tablet Take 1 tablet (40 mg total) by mouth daily at 6 (six) AM. 90 tablet 1  . ticagrelor (BRILINTA) 90 MG TABS tablet Take 1 tablet (90 mg total) by mouth 2 (two) times daily. 60 tablet 6   No current facility-administered medications for this visit.    Allergies:   Lisinopril and Shellfish allergy    Social History:  The patient  reports that she quit smoking about 12 months ago. She has never used smokeless tobacco. She reports that she does not drink alcohol or use illicit drugs.   Family History:  The patient's family history includes Cancer in her maternal grandfather and maternal grandmother; Cancer - Cervical in her maternal aunt; Diabetes in her father and paternal grandfather.    ROS:  Please see the history of present illness.   Otherwise, review of systems are positive for none.   All other  systems are reviewed and negative.    PHYSICAL EXAM: VS:  BP 110/72 mmHg  Pulse 88  Ht _0  (1.626 m)  Wt 200 lb 4 oz (90.833 kg)  BMI 34.36 kg/m2  LMP 03/09/2016 (Approximate) , BMI Body mass index is 34.36 kg/(m^2). GEN: Well nourished, well developed, in no acute distress HEENT: normal Neck: no JVD, carotid bruits, or masses Cardiac: RRR; no murmurs, rubs, or gallops,no edema  Respiratory:  clear to auscultation bilaterally, normal work of breathing GI: soft, nontender, nondistended, + BS MS: no deformity or atrophy Skin: warm and dry,  no rash Neuro:  Strength and sensation are intact Psych: euthymic mood, full affect   EKG:  EKG is ordered today. The ekg ordered today demonstrates normal sinus rhythm with no significant ST or T wave changes.   Recent Labs: 02/13/2016: B Natriuretic Peptide 127.0* 03/01/2016: TSH 4.154 03/12/2016: ALT 16 03/25/2016: BUN 25*; Creatinine, Ser 1.04*; Hemoglobin 11.0*; Platelets 289; Potassium 3.7; Sodium 137    Lipid Panel    Component Value Date/Time   CHOL 154 03/06/2016 0900   CHOL 133 03/31/2015 0225   TRIG 111 03/06/2016 0900   TRIG 77 03/31/2015 0225   HDL 39* 03/06/2016 0900   HDL 26* 03/31/2015 0225   CHOLHDL 2.9 05/16/2015 1101   VLDL 15 03/31/2015 0225   LDLCALC 93 03/06/2016 0900   LDLCALC 92 03/31/2015 0225      Wt Readings from Last 3 Encounters:  03/29/16 200 lb 4 oz (90.833 kg)  03/25/16 199 lb (90.266 kg)  03/19/16 198 lb (89.812 kg)        ASSESSMENT AND PLAN:  1.  Coronary artery disease involving native coronary arteries with other forms of angina: The patient continues to have intermittent chest pain which seems to be due to anxiety. Recent nuclear stress test showed no evidence of ischemia. Her LV systolic function has improved to normal. The patient already started doing some exercise with no exertional symptoms. I advised her to continue to be active.  2. Chronic systolic heart failure: Ejection fraction  improved to normal. She appears to be euvolemic. Continue treatment with losartan and carvedilol.  3. Essential hypertension: Blood pressure is well controlled on current medications.  4. Hyperlipidemia: Continue treatment with atorvastatin. Most recent LDL was 93.    Disposition:   FU with me in 3 months  Signed,  Kathlyn Sacramento, MD  03/29/2016 3:25 PM    [facility]

## 2016-03-29 NOTE — Patient Instructions (Signed)
Medication Instructions: Continue same medications.   Labwork: None.   Procedures/Testing: None.   Follow-Up: 3 months with Dr. Arida.   Any Additional Special Instructions Will Be Listed Below (If Applicable).     If you need a refill on your cardiac medications before your next appointment, please call your pharmacy.   

## 2016-04-03 ENCOUNTER — Encounter: Payer: Self-pay | Admitting: Unknown Physician Specialty

## 2016-04-03 ENCOUNTER — Ambulatory Visit (INDEPENDENT_AMBULATORY_CARE_PROVIDER_SITE_OTHER): Payer: Medicaid Other | Admitting: Unknown Physician Specialty

## 2016-04-03 VITALS — BP 124/85 | HR 89 | Temp 98.7°F | Ht 64.5 in | Wt 205.0 lb

## 2016-04-03 DIAGNOSIS — E1122 Type 2 diabetes mellitus with diabetic chronic kidney disease: Secondary | ICD-10-CM

## 2016-04-03 DIAGNOSIS — D649 Anemia, unspecified: Secondary | ICD-10-CM | POA: Diagnosis not present

## 2016-04-03 DIAGNOSIS — Z794 Long term (current) use of insulin: Secondary | ICD-10-CM | POA: Diagnosis not present

## 2016-04-03 DIAGNOSIS — N181 Chronic kidney disease, stage 1: Secondary | ICD-10-CM | POA: Diagnosis not present

## 2016-04-03 DIAGNOSIS — Z0000 Encounter for general adult medical examination without abnormal findings: Secondary | ICD-10-CM | POA: Diagnosis not present

## 2016-04-03 NOTE — Progress Notes (Signed)
BP 124/85 mmHg  Pulse 89  Temp(Src) 98.7 F (37.1 C)  Ht 5' 4.5" (1.638 m)  Wt 205 lb (92.987 kg)  BMI 34.66 kg/m2  SpO2 100%  LMP 03/09/2016 (Approximate)   Subjective:    Patient ID: [name] [name], female    DOB: 06/30/1981, 35 y.o.   MRN: [id number]  HPI: [name] [name] is a 35 y.o. female  Chief Complaint  Patient presents with  . Diabetes  . Hyperlipidemia  . Hypertension  . Labs Only    patient interested in HIV screening, order entered   Diabetes:  Using medications without difficulties.  Taking 24 units of Lantus in the AM No hypoglycemic episodes: blood sugar was 64 this AM No hyperglycemic episodes: none Feet problems: none Blood Sugars averaging:  Typically between 90-100 eye exam within last year  Chest pain: Went to the ER.  No problems found.    Anemia: Pt taking iron supplements and H/H are stable.  Taking iron 2 times/day.     Relevant past medical, surgical, family and social history reviewed and updated as indicated. Interim medical history since our last visit reviewed. Allergies and medications reviewed and updated.  Review of Systems  Per HPI unless specifically indicated above     Objective:    BP 124/85 mmHg  Pulse 89  Temp(Src) 98.7 F (37.1 C)  Ht 5' 4.5" (1.638 m)  Wt 205 lb (92.987 kg)  BMI 34.66 kg/m2  SpO2 100%  LMP 03/09/2016 (Approximate)  Wt Readings from Last 3 Encounters:  04/03/16 205 lb (92.987 kg)  03/29/16 200 lb 4 oz (90.833 kg)  03/25/16 199 lb (90.266 kg)    Physical Exam  Constitutional: She is oriented to person, place, and time. She appears well-developed and well-nourished. No distress.  HENT:  Head: Normocephalic and atraumatic.  Eyes: Conjunctivae and lids are normal. Right eye exhibits no discharge. Left eye exhibits no discharge. No scleral icterus.  Neck: Normal range of motion. Neck supple. No JVD present. Carotid bruit is not present.  Cardiovascular: Normal rate, regular rhythm and normal heart  sounds.   Pulmonary/Chest: Effort normal and breath sounds normal.  Abdominal: Normal appearance. There is no splenomegaly or hepatomegaly.  Musculoskeletal: Normal range of motion.  Neurological: She is alert and oriented to person, place, and time.  Skin: Skin is warm, dry and intact. No rash noted. No pallor.  Psychiatric: She has a normal mood and affect. Her behavior is normal. Judgment and thought content normal.    Results for orders placed or performed during the hospital encounter of [id number]  Basic metabolic panel  Result Value Ref Range   Sodium 137 135 - 145 mmol/L   Potassium 3.7 3.5 - 5.1 mmol/L   Chloride 111 101 - 111 mmol/L   CO2 22 22 - 32 mmol/L   Glucose, Bld 174 (H) 65 - 99 mg/dL   BUN 25 (H) 6 - 20 mg/dL   Creatinine, Ser 1.04 (H) 0.44 - 1.00 mg/dL   Calcium 8.3 (L) 8.9 - 10.3 mg/dL   GFR calc non Af Amer >60 >60 mL/min   GFR calc Af Amer >60 >60 mL/min   Anion gap 4 (L) 5 - 15  CBC  Result Value Ref Range   WBC 6.0 3.6 - 11.0 K/uL   RBC 4.11 3.80 - 5.20 MIL/uL   Hemoglobin 11.0 (L) 12.0 - 16.0 g/dL   HCT 33.3 (L) 35.0 - 47.0 %   MCV 81.1 80.0 - 100.0 fL   MCH  26.7 26.0 - 34.0 pg   MCHC 32.9 32.0 - 36.0 g/dL   RDW 16.6 (H) 11.5 - 14.5 %   Platelets 289 150 - 440 K/uL  Troponin I  Result Value Ref Range   Troponin I <0.03 <0.031 ng/mL  Troponin I  Result Value Ref Range   Troponin I <0.03 <0.031 ng/mL  Fibrin derivatives D-Dimer ([facility] only)  Result Value Ref Range   Fibrin derivatives D-dimer ([facility]) 400 0 - 499      Assessment & Plan:   Problem List Items Addressed This Visit      Unprioritized   Anemia    Taking iron supplements.  Stable H/H. Will check anemia panel today      Relevant Orders   Vitamin B12   Folate   Iron and TIBC   Ferritin   Diabetes type 2, controlled ([place])    Getting hypoglycemic with some diet changes.  Cut back insulin to 16 units.         Other Visit Diagnoses    Health care maintenance    -  Primary     Relevant Orders    HIV antibody        Follow up plan: Return in about 2 months (around 06/03/2016) for Needs CBC, Hgb A1C, CMP,.

## 2016-04-03 NOTE — Assessment & Plan Note (Signed)
Getting hypoglycemic with some diet changes.  Cut back insulin to 16 units.

## 2016-04-03 NOTE — Assessment & Plan Note (Signed)
Taking iron supplements.  Stable H/H. Will check anemia panel today

## 2016-04-04 LAB — IRON AND TIBC
Iron Saturation: 22 % (ref 15–55)
Iron: 65 ug/dL (ref 27–159)
Total Iron Binding Capacity: 289 ug/dL (ref 250–450)
UIBC: 224 ug/dL (ref 131–425)

## 2016-04-04 LAB — FOLATE: Folate: 8.6 ng/mL (ref >3.0)

## 2016-04-04 LAB — HIV ANTIBODY (ROUTINE TESTING W REFLEX): HIV Screen 4th Generation wRfx: NONREACTIVE

## 2016-04-04 LAB — FERRITIN: Ferritin: 74 ng/mL (ref 15–150)

## 2016-04-04 LAB — VITAMIN B12: Vitamin B-12: 910 pg/mL (ref 211–946)

## 2016-04-12 ENCOUNTER — Emergency Department
Admission: EM | Admit: 2016-04-12 | Discharge: 2016-04-12 | Disposition: A | Discharge status: Home / Self Care | Payer: Medicaid Other | Attending: Emergency Medicine | Admitting: Emergency Medicine

## 2016-04-12 ENCOUNTER — Encounter: Payer: Self-pay | Admitting: Emergency Medicine

## 2016-04-12 ENCOUNTER — Emergency Department: Payer: Medicaid Other

## 2016-04-12 DIAGNOSIS — I255 Ischemic cardiomyopathy: Secondary | ICD-10-CM | POA: Insufficient documentation

## 2016-04-12 DIAGNOSIS — R091 Pleurisy: Secondary | ICD-10-CM | POA: Insufficient documentation

## 2016-04-12 DIAGNOSIS — I252 Old myocardial infarction: Secondary | ICD-10-CM | POA: Insufficient documentation

## 2016-04-12 DIAGNOSIS — I2510 Atherosclerotic heart disease of native coronary artery without angina pectoris: Secondary | ICD-10-CM | POA: Diagnosis not present

## 2016-04-12 DIAGNOSIS — Z794 Long term (current) use of insulin: Secondary | ICD-10-CM | POA: Insufficient documentation

## 2016-04-12 DIAGNOSIS — Z87891 Personal history of nicotine dependence: Secondary | ICD-10-CM | POA: Insufficient documentation

## 2016-04-12 DIAGNOSIS — E785 Hyperlipidemia, unspecified: Secondary | ICD-10-CM | POA: Insufficient documentation

## 2016-04-12 DIAGNOSIS — E119 Type 2 diabetes mellitus without complications: Secondary | ICD-10-CM | POA: Diagnosis not present

## 2016-04-12 DIAGNOSIS — E669 Obesity, unspecified: Secondary | ICD-10-CM | POA: Insufficient documentation

## 2016-04-12 DIAGNOSIS — B349 Viral infection, unspecified: Secondary | ICD-10-CM | POA: Diagnosis present

## 2016-04-12 DIAGNOSIS — Z79899 Other long term (current) drug therapy: Secondary | ICD-10-CM | POA: Insufficient documentation

## 2016-04-12 DIAGNOSIS — I110 Hypertensive heart disease with heart failure: Secondary | ICD-10-CM | POA: Insufficient documentation

## 2016-04-12 DIAGNOSIS — Z7982 Long term (current) use of aspirin: Secondary | ICD-10-CM | POA: Diagnosis not present

## 2016-04-12 DIAGNOSIS — Z7984 Long term (current) use of oral hypoglycemic drugs: Secondary | ICD-10-CM | POA: Diagnosis not present

## 2016-04-12 DIAGNOSIS — I5022 Chronic systolic (congestive) heart failure: Secondary | ICD-10-CM | POA: Insufficient documentation

## 2016-04-12 LAB — URINALYSIS COMPLETE WITH MICROSCOPIC (ARMC ONLY)
Bilirubin Urine: NEGATIVE
Glucose, UA: NEGATIVE mg/dL
Ketones, ur: NEGATIVE mg/dL
Leukocytes, UA: NEGATIVE
Nitrite: NEGATIVE
Protein, ur: 100 mg/dL — AB
RBC / HPF: NONE SEEN RBC/hpf (ref 0–5)
Specific Gravity, Urine: 1.016 (ref 1.005–1.030)
Squamous Epithelial / LPF: NONE SEEN
WBC, UA: NONE SEEN WBC/hpf (ref 0–5)
pH: 5 (ref 5.0–8.0)

## 2016-04-12 LAB — CBC
HCT: 32.9 % — ABNORMAL LOW (ref 35.0–47.0)
Hemoglobin: 10.9 g/dL — ABNORMAL LOW (ref 12.0–16.0)
MCH: 27 pg (ref 26.0–34.0)
MCHC: 33 g/dL (ref 32.0–36.0)
MCV: 81.8 fL (ref 80.0–100.0)
Platelets: 292 10*3/uL (ref 150–440)
RBC: 4.03 MIL/uL (ref 3.80–5.20)
RDW: 16.1 % — ABNORMAL HIGH (ref 11.5–14.5)
WBC: 6.6 10*3/uL (ref 3.6–11.0)

## 2016-04-12 LAB — BASIC METABOLIC PANEL
Anion gap: 8 (ref 5–15)
BUN: 25 mg/dL — ABNORMAL HIGH (ref 6–20)
CO2: 22 mmol/L (ref 22–32)
Calcium: 8.6 mg/dL — ABNORMAL LOW (ref 8.9–10.3)
Chloride: 109 mmol/L (ref 101–111)
Creatinine, Ser: 1.01 mg/dL — ABNORMAL HIGH (ref 0.44–1.00)
GFR calc Af Amer: >60 mL/min (ref >60)
GFR calc non Af Amer: >60 mL/min (ref >60)
Glucose, Bld: 130 mg/dL — ABNORMAL HIGH (ref 65–99)
Potassium: 3.8 mmol/L (ref 3.5–5.1)
Sodium: 139 mmol/L (ref 135–145)

## 2016-04-12 LAB — POCT PREGNANCY, URINE: Preg Test, Ur: NEGATIVE

## 2016-04-12 LAB — POCT RAPID STREP A: Streptococcus, Group A Screen (Direct): NEGATIVE

## 2016-04-12 LAB — TROPONIN I: Troponin I: <0.03 ng/mL (ref <0.031)

## 2016-04-12 MED ORDER — ONDANSETRON HCL 4 MG PO TABS: 4.0000 mg | ORAL_TABLET | Freq: Every day | ORAL | Status: DC | PRN

## 2016-04-12 NOTE — ED Notes (Signed)
Patient ambulatory to triage with steady gait, without difficulty or distress noted; pt reports sore throat yesterday with congestion & fever; tonight having SHOB, nausea , lower ribcage pain radiating into back

## 2016-04-12 NOTE — ED Provider Notes (Signed)
[facility] Emergency Department Provider Note   ____________________________________________  Time seen: Approximately 330 AM  I have reviewed the triage vital signs and the nursing notes.   HISTORY  Chief Complaint Cough and Shortness of Breath    HPI [name] [name] is a 35 y.o. female with a history of coronary artery disease and ischemic cardiomyopathy was presenting to the emergency department today with several days of fever, nausea vomiting, diarrhea or sore throat and nasal congestion. She is also reporting chest pain just last of the sternum when she coughs. She also says that she has pain to her bilateral ribs and back with deep breathing. She says that the chest pain as well as the rib pain just started earlier today. She is a known sick contact of her daughter with a similar viral illness. Says that she has had several episodes of strep throat in the past and is concerned that this may be a repeat episode.Does not report any recent antibiotics. Several episodes of diarrhea this week as well. However, reports his intermittent diarrhea and ongoing basis. Does not report any blood in her diarrhea or vomitus.   Past Medical History  Diagnosis Date  . CAD (coronary artery disease)     a. NSTEMI 03/2015: mLCx 100% s/p PCI/DES, RI 90% s/p PCI/DES, EF 35%; b. 04/2015 Relook Cath: LM nl, LAD 40p, RI patent stent, LCX patent stent, RCA nl, EF 55-65%.  . Chronic systolic CHF (congestive heart failure) ([place])     a. echo 03/2015: EF 30-35%, mild concentric LVH, severe HK of inf, inflat, & lat walls, mod MR, mild TR;  b. 04/2015 LV Gram: EF 55-65%.  . Ischemic cardiomyopathy     a. 03/2015 EF 30-35% post NSTEMI;  b. 04/2015 EF 55-65% on LV gram.  . DM2 (diabetes mellitus, type 2) ([place])     a. since 2002  . Tobacco abuse     a. quit 03/2015.  . Obesity   . Hyperlipidemia   . Hypotension     a. resulting in discontinuation of losartan and reduction in coreg dose.    Marland Kitchen Heavy menses     a. H/O IUD - expired in 2014 - remains in place.  . Iron deficiency anemia   . Kidney disease   . Uterine fibroid   . Hypertension   . MI (myocardial infarction) ([place]) 03/29/2015  . Renal insufficiency     Patient Active Problem List   Diagnosis Date Noted  . Anemia 04/03/2016  . Chronic diastolic heart failure ([place]) 03/19/2016  . Tachycardia 03/19/2016  . IBS (irritable bowel syndrome) 12/15/2015  . Proteinuria 08/30/2015  . Coronary artery disease involving native coronary artery of native heart with angina pectoris with documented spasm ([place])   . Angina pectoris ([place])   . Iron deficiency anemia   . Heavy menses   . SOB (shortness of breath) 08/21/2015  . Chest pain 05/05/2015  . CAD in native artery 05/05/2015  . Hyperlipidemia 04/13/2015  . Chronic systolic CHF (congestive heart failure) ([place])   . Ischemic cardiomyopathy   . Diabetes type 2, controlled ([place])   . Tobacco abuse   . Obesity     Past Surgical History  Procedure Laterality Date  . Mouth surgery    . Cardiac catheterization  4/16    x2 stent [facility]  . Cardiac catheterization N/A 05/05/2015    Procedure: Left Heart Cath and Coronary Angiography;  Surgeon: Peter M Martinique, MD;  Location: [facility];  Service: Cardiovascular;  Laterality: N/A;    Current Outpatient Rx  Name  Route  Sig  Dispense  Refill  . aspirin EC 81 MG tablet   Oral   Take 81 mg by mouth daily.         Marland Kitchen atorvastatin (LIPITOR) 20 MG tablet   Oral   Take 1 tablet (20 mg total) by mouth daily.   30 tablet   6   . blood glucose meter kit and supplies KIT      Dispense based on patient and insurance preference. Use up to four times daily as directed. E11.22.   1 each   0   . carvedilol (COREG) 6.25 MG tablet   Oral   Take 1 tablet (6.25 mg total) by mouth 2 (two) times daily.   60 tablet   3   . etonogestrel (NEXPLANON) 68 MG IMPL implant   Subdermal   1 each by Subdermal route once.          . ferrous sulfate 325 (65 FE) MG tablet   Oral   Take 1 tablet (325 mg total) by mouth 3 (three) times daily with meals.   90 tablet   3   . glimepiride (AMARYL) 4 MG tablet   Oral   Take 1 tablet (4 mg total) by mouth daily.   90 tablet   1   . glucose blood (ACCU-CHEK ACTIVE STRIPS) test strip      Use as instructed   100 each   12   . Insulin Glargine (LANTUS SOLOSTAR) 100 UNIT/ML Solostar Pen   Subcutaneous   Inject 60 Units into the skin daily at 10 pm. Start with 20 units QHS.  Titrate to AM blood sugar Patient taking differently: Inject 20 Units into the skin daily at 10 pm. Start with 20 units QHS.  Titrate to AM blood sugar   5 pen   PRN   . loperamide (IMODIUM) 2 MG capsule   Oral   Take 2 mg by mouth as needed for diarrhea or loose stools.         Marland Kitchen LORazepam (ATIVAN) 1 MG tablet   Oral   Take 1 tablet (1 mg total) by mouth 2 (two) times daily.   60 tablet   0   . losartan (COZAAR) 50 MG tablet   Oral   Take 1 tablet (50 mg total) by mouth daily.   30 tablet   3   . Melatonin 5 MG TABS   Oral   Take 5 mg by mouth daily.         . nitroGLYCERIN (NITROSTAT) 0.4 MG SL tablet   Sublingual   Place 1 tablet (0.4 mg total) under the tongue every 5 (five) minutes as needed for chest pain. Reported on 02/20/2016   25 tablet   3   . pantoprazole (PROTONIX) 40 MG tablet   Oral   Take 1 tablet (40 mg total) by mouth daily at 6 (six) AM.   90 tablet   1   . ticagrelor (BRILINTA) 90 MG TABS tablet   Oral   Take 1 tablet (90 mg total) by mouth 2 (two) times daily.   60 tablet   6     Allergies Lisinopril and Shellfish allergy  Family History  Problem Relation Age of Onset  . Diabetes Father   . Cancer Maternal Grandmother     lung  . Cancer Maternal Grandfather     prostate  . Diabetes Paternal  Grandfather   . Cancer - Cervical Maternal Aunt     Social History Social History  Substance Use Topics  . Smoking status: Former Smoker -- 15  years    Quit date: 03/10/2015  . Smokeless tobacco: Never Used  . Alcohol Use: No    Review of Systems Constitutional: No fever/chills Eyes: No visual changes. ENT: No sore throat. Cardiovascular: As above Respiratory: Shortness of breath with exertion. Gastrointestinal: No abdominal pain.    No constipation. Genitourinary: Negative for dysuria. Musculoskeletal: Negative for back pain. Skin: Negative for rash. Neurological: Negative for headaches, focal weakness or numbness.  10-point ROS otherwise negative.  ____________________________________________   PHYSICAL EXAM:  VITAL SIGNS: ED Triage Vitals  Enc Vitals Group     BP 04/12/16 0101 137/93 mmHg     Pulse Rate 04/12/16 0101 110     Resp 04/12/16 0101 22     Temp 04/12/16 0101 98.2 F (36.8 C)     Temp Source 04/12/16 0101 Oral     SpO2 04/12/16 0101 100 %     Weight 04/12/16 0101 205 lb (92.987 kg)     Height 04/12/16 0101 '5\' 4"'$  (1.626 m)     Head Cir --      Peak Flow --      Pain Score 04/12/16 0057 5     Pain Loc --      Pain Edu? --      Excl. in [place]? --     Constitutional: Alert and oriented. Well appearing and in no acute distress. Eyes: Conjunctivae are normal. PERRL. EOMI. Head: Atraumatic. Nose: Mild to moderate rhinorrhea bilaterally. Mouth/Throat: Mucous membranes are moist.  Oropharynx non-erythematous. Neck: No stridor.  Mild tender and swollen anterior cervical lymphadenopathy. Cardiovascular: Normal rate, regular rhythm. Grossly normal heart sounds.  Good peripheral circulation.Chest pain to the left of the sternum is reproducible palpation. Respiratory: Normal respiratory effort.  No retractions. Lungs CTAB. Gastrointestinal: Soft and nontender. No distention.  No CVA tenderness. Musculoskeletal: No lower extremity tenderness nor edema.  No joint effusions. Neurologic:  Normal speech and language. No gross focal neurologic deficits are appreciated.  Skin:  Skin is warm, dry and intact. No  rash noted. Psychiatric: Mood and affect are normal. Speech and behavior are normal.  ____________________________________________   LABS (all labs ordered are listed, but only abnormal results are displayed)  Labs Reviewed  BASIC METABOLIC PANEL - Abnormal; Notable for the following:    Glucose, Bld 130 (*)    BUN 25 (*)    Creatinine, Ser 1.01 (*)    Calcium 8.6 (*)    All other components within normal limits  CBC - Abnormal; Notable for the following:    Hemoglobin 10.9 (*)    HCT 32.9 (*)    RDW 16.1 (*)    All other components within normal limits  URINALYSIS COMPLETEWITH MICROSCOPIC ([facility] ONLY) - Abnormal; Notable for the following:    Color, Urine YELLOW (*)    APPearance CLEAR (*)    Hgb urine dipstick 3+ (*)    Protein, ur 100 (*)    Bacteria, UA FEW (*)    All other components within normal limits  CULTURE, GROUP A STREP ([place])  TROPONIN I  POC URINE PREG, ED  POCT PREGNANCY, URINE  POCT RAPID STREP A   ____________________________________________  EKG  ED ECG REPORT I, Doran Stabler, the attending physician, personally viewed and interpreted this ECG.   Date: 04/12/2016  EKG Time: 1:00  Rate:  118  Rhythm: sinus tachycardia  Axis: Normal axis  Intervals:none  ST&T Change: No ST segment elevation or depression. No abnormal T-wave inversion.  ____________________________________________  RADIOLOGY  Chest 2 View (Final result) Result time: 04/12/16 01:20:39   Final result by Rad Results In Interface (04/12/16 01:20:39)   Narrative:   CLINICAL DATA: Sore throat yesterday. Congestion and fever. Shortness of breath and nausea today. Lower rib cage pain radiating to the back.  EXAM: CHEST 2 VIEW  COMPARISON: 03/25/2016  FINDINGS: The heart size and mediastinal contours are within normal limits. Both lungs are clear. The visualized skeletal structures are unremarkable.  IMPRESSION: No active cardiopulmonary  disease.   Electronically Signed By: Lucienne Capers M.D. On: 04/12/2016 01:20       ____________________________________________   PROCEDURES  ____________________________________________   INITIAL IMPRESSION / ASSESSMENT AND PLAN / ED COURSE  Pertinent labs & imaging results that were available during my care of the patient were reviewed by me and considered in my medical decision making (see chart for details).  ----------------------------------------- 4:09 AM on 04/12/2016 -----------------------------------------  Patient is resting comfortably. Likely viral syndrome. Constellation of symptoms point to this diagnosis is the most likely. Multiple workups including one about one month ago which was negative for ACS as well as having a negative d-dimer. Also had chest pain with deep breathing at that time. Feels that the pain today is likely chest wall pain, especially given that the patient has symptoms exerted by coughing. Deep breathing likely pleurisy from a viral illness. The patient has not vomited in the examination room. We'll discharge with anti-emetic. Recommended Tylenol as well as hot tea with honey. ____________________________________________   FINAL CLINICAL IMPRESSION(S) / ED DIAGNOSES  Viral syndrome. Pleurisy.    NEW MEDICATIONS STARTED DURING THIS VISIT:  New Prescriptions   No medications on file     Note:  This document was prepared using Dragon voice recognition software and may include unintentional dictation errors.    Orbie Pyo, MD 04/12/16 [phone]

## 2016-04-12 NOTE — Discharge Instructions (Signed)
Pleurisy °Pleurisy is redness, puffiness (swelling), and soreness (inflammation) of the lining of the lungs. It can be hard to breathe and hurt to breathe. Coughing or deep breathing will make it hurt more. It is often caused by an existing infection or disease.  °HOME CARE °· Only take medicine as told by your doctor. °· Only take antibiotic medicine as directed. Make sure to finish it even if you start to feel better. °GET HELP RIGHT AWAY IF:  °· Your lips, fingernails, or toenails are blue or dark. °· You cough up blood. °· You have a hard time breathing. °· Your pain is not controlled with medicine or it lasts for more than 1 week. °· Your pain spreads (radiates) into your neck, arms, or jaw. °· You are short of breath or wheezing. °· You develop a fever, rash, throw up (vomit), or faint. °MAKE SURE YOU:  °· Understand these instructions. °· Will watch your condition. °· Will get help right away if you are not doing well or get worse. °  °This information is not intended to replace advice given to you by your health care provider. Make sure you discuss any questions you have with your health care provider. °  °Document Released: 11/07/2008 Document Revised: 07/28/2013 Document Reviewed: 05/09/2013 °Elsevier Interactive Patient Education ©2016 [employer] Inc[employer] ° °

## 2016-04-12 NOTE — ED Notes (Addendum)
Pt presents with c/o sore throat, cough, and chest "ache" since Tuesday. Pt reports intermittent fevers, non-productive cough, and chest pain during coughing. Pt reports chest/rib cage pain radiates into bilateral sides of her back. Pt reports (+) N/V/D "for the past 4 days".

## 2016-04-14 LAB — CULTURE, GROUP A STREP (THRC)

## 2016-04-17 ENCOUNTER — Telehealth: Payer: Self-pay | Admitting: Cardiovascular Disease

## 2016-04-17 ENCOUNTER — Other Ambulatory Visit: Payer: Self-pay

## 2016-04-17 DIAGNOSIS — Z95820 Peripheral vascular angioplasty status with implants and grafts: Secondary | ICD-10-CM

## 2016-04-17 DIAGNOSIS — I2510 Atherosclerotic heart disease of native coronary artery without angina pectoris: Secondary | ICD-10-CM

## 2016-04-17 MED ORDER — TICAGRELOR 90 MG PO TABS: 90.0000 mg | ORAL_TABLET | Freq: Two times a day (BID) | ORAL | Status: DC

## 2016-04-17 NOTE — Telephone Encounter (Signed)
S/w pt who requests Brilinta samples. She has a current prescription and medicaid insurance but states she can not afford the $3 copay this month. I have placed samples at front desk. Pt understands they are available for pickup M-F, 8am-5pm. Pt had no further questions.

## 2016-04-17 NOTE — Telephone Encounter (Signed)
Pt has a question regarding her Brilinta. Please call.

## 2016-04-19 ENCOUNTER — Emergency Department: Payer: Medicaid Other

## 2016-04-19 ENCOUNTER — Encounter: Payer: Self-pay | Admitting: Emergency Medicine

## 2016-04-19 ENCOUNTER — Emergency Department
Admission: EM | Admit: 2016-04-19 | Discharge: 2016-04-20 | Disposition: A | Discharge status: Home / Self Care | Payer: Medicaid Other | Attending: Emergency Medicine | Admitting: Emergency Medicine

## 2016-04-19 ENCOUNTER — Other Ambulatory Visit: Payer: Self-pay

## 2016-04-19 DIAGNOSIS — I5022 Chronic systolic (congestive) heart failure: Secondary | ICD-10-CM | POA: Diagnosis not present

## 2016-04-19 DIAGNOSIS — I252 Old myocardial infarction: Secondary | ICD-10-CM | POA: Insufficient documentation

## 2016-04-19 DIAGNOSIS — E785 Hyperlipidemia, unspecified: Secondary | ICD-10-CM | POA: Diagnosis not present

## 2016-04-19 DIAGNOSIS — I110 Hypertensive heart disease with heart failure: Secondary | ICD-10-CM | POA: Diagnosis not present

## 2016-04-19 DIAGNOSIS — Z79899 Other long term (current) drug therapy: Secondary | ICD-10-CM | POA: Insufficient documentation

## 2016-04-19 DIAGNOSIS — I25119 Atherosclerotic heart disease of native coronary artery with unspecified angina pectoris: Secondary | ICD-10-CM | POA: Diagnosis not present

## 2016-04-19 DIAGNOSIS — Z8679 Personal history of other diseases of the circulatory system: Secondary | ICD-10-CM | POA: Diagnosis not present

## 2016-04-19 DIAGNOSIS — R079 Chest pain, unspecified: Secondary | ICD-10-CM

## 2016-04-19 DIAGNOSIS — Z87891 Personal history of nicotine dependence: Secondary | ICD-10-CM | POA: Diagnosis not present

## 2016-04-19 DIAGNOSIS — I159 Secondary hypertension, unspecified: Secondary | ICD-10-CM

## 2016-04-19 DIAGNOSIS — Z794 Long term (current) use of insulin: Secondary | ICD-10-CM | POA: Insufficient documentation

## 2016-04-19 DIAGNOSIS — E119 Type 2 diabetes mellitus without complications: Secondary | ICD-10-CM | POA: Insufficient documentation

## 2016-04-19 DIAGNOSIS — I255 Ischemic cardiomyopathy: Secondary | ICD-10-CM | POA: Diagnosis not present

## 2016-04-19 DIAGNOSIS — Z7984 Long term (current) use of oral hypoglycemic drugs: Secondary | ICD-10-CM | POA: Diagnosis not present

## 2016-04-19 DIAGNOSIS — Z7982 Long term (current) use of aspirin: Secondary | ICD-10-CM | POA: Diagnosis not present

## 2016-04-19 LAB — BASIC METABOLIC PANEL
Anion gap: 5 (ref 5–15)
BUN: 23 mg/dL — ABNORMAL HIGH (ref 6–20)
CO2: 26 mmol/L (ref 22–32)
Calcium: 8.5 mg/dL — ABNORMAL LOW (ref 8.9–10.3)
Chloride: 107 mmol/L (ref 101–111)
Creatinine, Ser: 1.2 mg/dL — ABNORMAL HIGH (ref 0.44–1.00)
GFR calc Af Amer: >60 mL/min (ref >60)
GFR calc non Af Amer: 57 mL/min — ABNORMAL LOW (ref >60)
Glucose, Bld: 153 mg/dL — ABNORMAL HIGH (ref 65–99)
Potassium: 3.8 mmol/L (ref 3.5–5.1)
Sodium: 138 mmol/L (ref 135–145)

## 2016-04-19 LAB — CBC
HCT: 34.5 % — ABNORMAL LOW (ref 35.0–47.0)
Hemoglobin: 11.4 g/dL — ABNORMAL LOW (ref 12.0–16.0)
MCH: 26.4 pg (ref 26.0–34.0)
MCHC: 33.1 g/dL (ref 32.0–36.0)
MCV: 79.8 fL — ABNORMAL LOW (ref 80.0–100.0)
Platelets: 307 10*3/uL (ref 150–440)
RBC: 4.32 MIL/uL (ref 3.80–5.20)
RDW: 15 % — ABNORMAL HIGH (ref 11.5–14.5)
WBC: 5.9 10*3/uL (ref 3.6–11.0)

## 2016-04-19 LAB — TROPONIN I: Troponin I: <0.03 ng/mL (ref <0.031)

## 2016-04-19 MED ORDER — CARVEDILOL 6.25 MG PO TABS
6.2500 mg | ORAL_TABLET | Freq: Two times a day (BID) | ORAL | Status: DC
  Administered 2016-04-19: 6.25 mg via ORAL

## 2016-04-19 MED ORDER — TICAGRELOR 90 MG PO TABS
90.0000 mg | ORAL_TABLET | Freq: Once | ORAL | Status: AC
  Administered 2016-04-19: 90 mg via ORAL
  Filled 2016-04-19: qty 1

## 2016-04-19 MED ORDER — PROCHLORPERAZINE EDISYLATE 5 MG/ML IJ SOLN
INTRAMUSCULAR | Status: AC
  Filled 2016-04-19: qty 2

## 2016-04-19 MED ORDER — PROCHLORPERAZINE EDISYLATE 5 MG/ML IJ SOLN
10.0000 mg | Freq: Once | INTRAMUSCULAR | Status: AC
  Administered 2016-04-19: 10 mg via INTRAVENOUS

## 2016-04-19 MED ORDER — CARVEDILOL 6.25 MG PO TABS
ORAL_TABLET | ORAL | Status: AC
  Administered 2016-04-19: 6.25 mg via ORAL
  Filled 2016-04-19: qty 1

## 2016-04-19 MED ORDER — CARVEDILOL 6.25 MG PO TABS: 6.2500 mg | ORAL_TABLET | Freq: Two times a day (BID) | ORAL | Status: DC

## 2016-04-19 NOTE — ED Notes (Signed)
Pt c/o hypertension, dizziness, headache, and vision changes beginning Wednesday. Pt reports her BP is normally 120/80. Pt reports today her BP was 190/117. Pt's pressure is currently 188/104.

## 2016-04-19 NOTE — ED Notes (Signed)
PT reports having an MI last year in April and having 2 stents placed.

## 2016-04-19 NOTE — Discharge Instructions (Signed)
Please seek medical attention for any high fevers, chest pain, shortness of breath, change in behavior, persistent vomiting, bloody stool or any other new or concerning symptoms. ° ° °Nonspecific Chest Pain °It is often hard to find the cause of chest pain. There is always a chance that your pain could be related to something serious, such as a heart attack or a blood clot in your lungs. Chest pain can also be caused by conditions that are not life-threatening. If you have chest pain, it is very important to follow up with your doctor. ° °HOME CARE °· If you were prescribed an antibiotic medicine, finish it all even if you start to feel better. °· Avoid any activities that cause chest pain. °· Do not use any tobacco products, including cigarettes, chewing tobacco, or electronic cigarettes. If you need help quitting, ask your doctor. °· Do not drink alcohol. °· Take medicines only as told by your doctor. °· Keep all follow-up visits as told by your doctor. This is important. This includes any further testing if your chest pain does not go away. °· Your doctor may tell you to keep your head raised (elevated) while you sleep. °· Make lifestyle changes as told by your doctor. These may include: °¨ Getting regular exercise. Ask your doctor to suggest some activities that are safe for you. °¨ Eating a heart-healthy diet. Your doctor or a diet specialist (dietitian) can help you to learn healthy eating options. °¨ Maintaining a healthy weight. °¨ Managing diabetes, if necessary. °¨ Reducing stress. °GET HELP IF: °· Your chest pain does not go away, even after treatment. °· You have a rash with blisters on your chest. °· You have a fever. °GET HELP RIGHT AWAY IF: °· Your chest pain is worse. °· You have an increasing cough, or you cough up blood. °· You have severe belly (abdominal) pain. °· You feel extremely weak. °· You pass out (faint). °· You have chills. °· You have sudden, unexplained chest discomfort. °· You have  sudden, unexplained discomfort in your arms, back, neck, or jaw. °· You have shortness of breath at any time. °· You suddenly start to sweat, or your skin gets clammy. °· You feel nauseous. °· You vomit. °· You suddenly feel light-headed or dizzy. °· Your heart begins to beat quickly, or it feels like it is skipping beats. °These symptoms may be an emergency. Do not wait to see if the symptoms will go away. Get medical help right away. Call your local emergency services (911 in the [place] Do not drive yourself to the hospital. °  °This information is not intended to replace advice given to you by your health care provider. Make sure you discuss any questions you have with your health care provider. °  °Document Released: 05/13/2008 Document Revised: 12/16/2014 Document Reviewed: 07/01/2014 °Elsevier Interactive Patient Education ©2016 [employer] Inc[employer] ° °

## 2016-04-19 NOTE — ED Notes (Signed)
Patient reports that her blood pressure has been elevated since Wednesday. Patient reports that she has a history of hypertension and MI. Patient reports that her blood pressure is normally well controlled with bp medication. Patient denies missing any doses. Patient reports that her blood pressure was 190/177 at home today. Patient reports feeling dizzy, tired and intermittent headaches since Wednesday. Patient reports that she started having left side chest pain about 15 minutes ago.

## 2016-04-19 NOTE — ED Notes (Signed)
Pt reports she takes nitroglycerin for chest pain normally, however has not taken any today.

## 2016-04-19 NOTE — ED Provider Notes (Signed)
[facility] Emergency Department Provider Note    ____________________________________________  Time seen: ~2235  I have reviewed the triage vital signs and the nursing notes.   HISTORY  Chief Complaint Hypertension; Chest Pain; and Headache   History limited by: Not Limited   HPI [name] [name] is a 35 y.o. female with history of coronary artery disease status post an STEMI, with stent placement, who presents to the emergency department today because of concerns for high blood pressure, headache. She states that she has had these symptoms for the past couple of days. They did get worse today. She states that it was a high blood pressure today that was bringing her to the emergency department. She states that when she did arrive to the emergency department she started feeling a little bit of squeezing-type pain in her central chest. She denies any radiation of this pain. She now states the pain is better. She does state that she has had this pain in the past. In the past they have thought it was related to anxiety. She does states she was feeling anxious when she was in the emergency department. She was concerned about her high blood pressure. The patient denies any recent fevers, shortness breath.   Past Medical History  Diagnosis Date  . CAD (coronary artery disease)     a. NSTEMI 03/2015: mLCx 100% s/p PCI/DES, RI 90% s/p PCI/DES, EF 35%; b. 04/2015 Relook Cath: LM nl, LAD 40p, RI patent stent, LCX patent stent, RCA nl, EF 55-65%.  . Chronic systolic CHF (congestive heart failure) ([place])     a. echo 03/2015: EF 30-35%, mild concentric LVH, severe HK of inf, inflat, & lat walls, mod MR, mild TR;  b. 04/2015 LV Gram: EF 55-65%.  . Ischemic cardiomyopathy     a. 03/2015 EF 30-35% post NSTEMI;  b. 04/2015 EF 55-65% on LV gram.  . DM2 (diabetes mellitus, type 2) ([place])     a. since 2002  . Tobacco abuse     a. quit 03/2015.  . Obesity   . Hyperlipidemia   .  Hypotension     a. resulting in discontinuation of losartan and reduction in coreg dose.  Marland Kitchen Heavy menses     a. H/O IUD - expired in 2014 - remains in place.  . Iron deficiency anemia   . Kidney disease   . Uterine fibroid   . Hypertension   . MI (myocardial infarction) ([place]) 03/29/2015  . Renal insufficiency     Patient Active Problem List   Diagnosis Date Noted  . Anemia 04/03/2016  . Chronic diastolic heart failure ([place]) 03/19/2016  . Tachycardia 03/19/2016  . IBS (irritable bowel syndrome) 12/15/2015  . Proteinuria 08/30/2015  . Coronary artery disease involving native coronary artery of native heart with angina pectoris with documented spasm ([place])   . Angina pectoris ([place])   . Iron deficiency anemia   . Heavy menses   . SOB (shortness of breath) 08/21/2015  . Chest pain 05/05/2015  . CAD in native artery 05/05/2015  . Hyperlipidemia 04/13/2015  . Chronic systolic CHF (congestive heart failure) ([place])   . Ischemic cardiomyopathy   . Diabetes type 2, controlled ([place])   . Tobacco abuse   . Obesity     Past Surgical History  Procedure Laterality Date  . Mouth surgery    . Cardiac catheterization  4/16    x2 stent [facility]  . Cardiac catheterization N/A 05/05/2015    Procedure: Left Heart Cath and  Coronary Angiography;  Surgeon: Peter M Martinique, MD;  Location: [facility];  Service: Cardiovascular;  Laterality: N/A;    Current Outpatient Rx  Name  Route  Sig  Dispense  Refill  . aspirin EC 81 MG tablet   Oral   Take 81 mg by mouth daily.         Marland Kitchen atorvastatin (LIPITOR) 20 MG tablet   Oral   Take 1 tablet (20 mg total) by mouth daily.   30 tablet   6   . blood glucose meter kit and supplies KIT      Dispense based on patient and insurance preference. Use up to four times daily as directed. E11.22.   1 each   0   . carvedilol (COREG) 6.25 MG tablet   Oral   Take 1 tablet (6.25 mg total) by mouth 2 (two) times daily.   60 tablet   3   . etonogestrel  (NEXPLANON) 68 MG IMPL implant   Subdermal   1 each by Subdermal route once.         . ferrous sulfate 325 (65 FE) MG tablet   Oral   Take 1 tablet (325 mg total) by mouth 3 (three) times daily with meals.   90 tablet   3   . glimepiride (AMARYL) 4 MG tablet   Oral   Take 1 tablet (4 mg total) by mouth daily.   90 tablet   1   . glucose blood (ACCU-CHEK ACTIVE STRIPS) test strip      Use as instructed   100 each   12   . Insulin Glargine (LANTUS SOLOSTAR) 100 UNIT/ML Solostar Pen   Subcutaneous   Inject 60 Units into the skin daily at 10 pm. Start with 20 units QHS.  Titrate to AM blood sugar Patient taking differently: Inject 20 Units into the skin daily at 10 pm. Start with 20 units QHS.  Titrate to AM blood sugar   5 pen   PRN   . loperamide (IMODIUM) 2 MG capsule   Oral   Take 2 mg by mouth as needed for diarrhea or loose stools.         Marland Kitchen LORazepam (ATIVAN) 1 MG tablet   Oral   Take 1 tablet (1 mg total) by mouth 2 (two) times daily.   60 tablet   0   . losartan (COZAAR) 50 MG tablet   Oral   Take 1 tablet (50 mg total) by mouth daily.   30 tablet   3   . Melatonin 5 MG TABS   Oral   Take 5 mg by mouth daily.         . nitroGLYCERIN (NITROSTAT) 0.4 MG SL tablet   Sublingual   Place 1 tablet (0.4 mg total) under the tongue every 5 (five) minutes as needed for chest pain. Reported on 02/20/2016   25 tablet   3   . ondansetron (ZOFRAN) 4 MG tablet   Oral   Take 1 tablet (4 mg total) by mouth daily as needed.   10 tablet   0   . pantoprazole (PROTONIX) 40 MG tablet   Oral   Take 1 tablet (40 mg total) by mouth daily at 6 (six) AM.   90 tablet   1   . ticagrelor (BRILINTA) 90 MG TABS tablet   Oral   Take 1 tablet (90 mg total) by mouth 2 (two) times daily.   60 tablet   2  Allergies Lisinopril and Shellfish allergy  Family History  Problem Relation Age of Onset  . Diabetes Father   . Cancer Maternal Grandmother     lung  .  Cancer Maternal Grandfather     prostate  . Diabetes Paternal Grandfather   . Cancer - Cervical Maternal Aunt     Social History Social History  Substance Use Topics  . Smoking status: Former Smoker -- 15 years    Quit date: 03/10/2015  . Smokeless tobacco: Never Used  . Alcohol Use: No    Review of Systems  Constitutional: Negative for fever. Cardiovascular: Negative for chest pain. Respiratory: Negative for shortness of breath. Gastrointestinal: Negative for abdominal pain, vomiting and diarrhea. Neurological: Negative for headaches, focal weakness or numbness.  10-point ROS otherwise negative.  ____________________________________________   PHYSICAL EXAM:  VITAL SIGNS: ED Triage Vitals  Enc Vitals Group     BP 04/19/16 2048 150/95 mmHg     Pulse Rate 04/19/16 2048 94     Resp 04/19/16 2048 18     Temp 04/19/16 2048 98.4 F (36.9 C)     Temp Source 04/19/16 2048 Oral     SpO2 04/19/16 2048 100 %     Weight 04/19/16 2048 197 lb (89.359 kg)     Height 04/19/16 2048 '5\' 4"'$  (1.626 m)     Head Cir --      Peak Flow --      Pain Score 04/19/16 2048 6   Constitutional: Alert and oriented. Well appearing and in no distress. Eyes: Conjunctivae are normal. PERRL. Normal extraocular movements. ENT   Head: Normocephalic and atraumatic.   Nose: No congestion/rhinnorhea.   Mouth/Throat: Mucous membranes are moist.   Neck: No stridor. Hematological/Lymphatic/Immunilogical: No cervical lymphadenopathy. Cardiovascular: Normal rate, regular rhythm.  No murmurs, rubs, or gallops. Respiratory: Normal respiratory effort without tachypnea nor retractions. Breath sounds are clear and equal bilaterally. No wheezes/rales/rhonchi. Gastrointestinal: Soft and nontender. No distention. There is no CVA tenderness. Genitourinary: Deferred Musculoskeletal: Normal range of motion in all extremities. No joint effusions.  No lower extremity tenderness nor edema. Neurologic:   Normal speech and language. No gross focal neurologic deficits are appreciated.  Skin:  Skin is warm, dry and intact. No rash noted. Psychiatric: Mood and affect are normal. Speech and behavior are normal. Patient exhibits appropriate insight and judgment.  ____________________________________________    LABS (pertinent positives/negatives)  Labs Reviewed  BASIC METABOLIC PANEL - Abnormal; Notable for the following:    Glucose, Bld 153 (*)    BUN 23 (*)    Creatinine, Ser 1.20 (*)    Calcium 8.5 (*)    GFR calc non Af Amer 57 (*)    All other components within normal limits  CBC - Abnormal; Notable for the following:    Hemoglobin 11.4 (*)    HCT 34.5 (*)    MCV 79.8 (*)    RDW 15.0 (*)    All other components within normal limits  TROPONIN I  TROPONIN I   2nd trop pending  ____________________________________________   EKG  Apolonio Schneiders, attending physician, personally viewed and interpreted this EKG  EKG Time: 2055 Rate: 92 Rhythm: normal sinus rhythm Axis: normal Intervals: qtc 464 QRS: narrow ST changes: no st elevation Impression: normal ekg  ____________________________________________    RADIOLOGY  CXR  IMPRESSION: Normal chest  ____________________________________________   PROCEDURES  Procedure(s) performed: None  Critical Care performed: No  ____________________________________________   INITIAL IMPRESSION / ASSESSMENT AND PLAN / ED  COURSE  Pertinent labs & imaging results that were available during my care of the patient were reviewed by me and considered in my medical decision making (see chart for details).  She with history of coronary disease who presents to the emergency department today because of concerns for hypertension. Once in the emergency department she did have an episode of chest pain. EKG chest x-ray and first troponin without concerning findings. I did have a discussion with the patient. She does think that her  chest pain was likely secondary to anxiety which has been in the past. She did not have any chest pain with her NSTEMI. I did offer admission to the patient however she declined. She would rather check a second troponin. I think this is not unreasonable. Will send a second troponin. Will prepare paperwork for oncoming physician in the event that it is negative.  ____________________________________________   FINAL CLINICAL IMPRESSION(S) / ED DIAGNOSES  Hypertension Chest pain  Nance Pear, MD 04/19/16 2320

## 2016-04-20 LAB — TROPONIN I: Troponin I: <0.03 ng/mL (ref <0.031)

## 2016-04-20 NOTE — ED Notes (Signed)
Reviewed d/c instructions, follow-up care with pt. Pt verbalized understanding 

## 2016-04-20 NOTE — ED Provider Notes (Signed)
Dr. Archie Balboa signed out to me [name] [name] awaiting repeat troponin.  Repeat troponin is negative, less than 0.03.  I updated the [name] [name], and [name] [name] was discharged with Dr. Gennette Pac prepared discharge instructions.  Lisa Roca, MD 04/20/16 [phone]

## 2016-04-22 ENCOUNTER — Telehealth: Payer: Self-pay

## 2016-04-22 NOTE — Telephone Encounter (Signed)
Patient called in and stated that she has been having trouble with her lantus pens locking up. Patient wanted to know if there was any other method to take the lantus so I placed the patient on hold and went and asked Malachy Mood. Malachy Mood stated that we could do vials or ampules but she suggested that we give her samples of toujeo to try instead. So I told the patient this, she stated that she was familiar with the vial method, but she said she would come get the toujeo. Patient wants to know if she should take the same amount of toujeo that she has been taking of the lantus.

## 2016-04-22 NOTE — Telephone Encounter (Signed)
Yes, the same amount.  Thanks

## 2016-04-23 ENCOUNTER — Ambulatory Visit: Payer: Medicaid Other | Admitting: Family

## 2016-04-23 ENCOUNTER — Telehealth: Payer: Self-pay | Admitting: Cardiovascular Disease

## 2016-04-23 MED ORDER — FUROSEMIDE 20 MG PO TABS: 20.0000 mg | ORAL_TABLET | ORAL | Status: DC | PRN

## 2016-04-23 NOTE — Telephone Encounter (Signed)
Spoke w/ pt.  Advised her of Dr. Tyrell Antonio recommendation.  She verbalizes understanding and is appreciative of the call.  Asked her to call back if sx do not improve.

## 2016-04-23 NOTE — Telephone Encounter (Signed)
Pt c/o swelling: STAT is pt has developed SOB within 24 hours  1. How long have you been experiencing swelling?     Started last night   2. Where is the swelling located? Calf to ankle bilateral   3.  Are you currently taking a "fluid pill"?  No   4.  Are you currently SOB?   No   5.  Have you traveled recently?   No

## 2016-04-23 NOTE — Telephone Encounter (Signed)
Spoke w/ pt.   She reports b/l ankle & calf swelling, wt is up 4 lbs in the last week to 201. She reports that she eats very little, does not add salt to her foods that she prepares herself (chicken, asparagus, shrimp, fish, rice). She only drinks water, reports about 2 bottles a day, which is difficult for her, as she is used to drinking a lot of water throughout the day.  She was advised at her last ED visit that she would be d/c'd w/ lasix, but Dr. Fletcher Anon was worried about her kidneys. Advised her to obtain TED hose or ACE bandage, keep her feet elevated when sitting, and continue her low sodium, low fluid diet.  Advised her that I will make Dr. Fletcher Anon aware and call her back w/ his recommendation.

## 2016-04-23 NOTE — Telephone Encounter (Signed)
She can use Lasix 20 mg once daily as needed but no more than twice a week.

## 2016-04-25 ENCOUNTER — Telehealth: Payer: Self-pay

## 2016-04-25 ENCOUNTER — Ambulatory Visit: Payer: Medicaid Other | Attending: Family | Admitting: Family

## 2016-04-25 ENCOUNTER — Encounter: Payer: Self-pay | Admitting: Family

## 2016-04-25 VITALS — BP 112/72 | HR 90 | Resp 20 | Ht 64.0 in | Wt 201.0 lb

## 2016-04-25 DIAGNOSIS — Z7982 Long term (current) use of aspirin: Secondary | ICD-10-CM | POA: Diagnosis not present

## 2016-04-25 DIAGNOSIS — I252 Old myocardial infarction: Secondary | ICD-10-CM | POA: Insufficient documentation

## 2016-04-25 DIAGNOSIS — I255 Ischemic cardiomyopathy: Secondary | ICD-10-CM | POA: Diagnosis not present

## 2016-04-25 DIAGNOSIS — Z9889 Other specified postprocedural states: Secondary | ICD-10-CM | POA: Diagnosis not present

## 2016-04-25 DIAGNOSIS — E669 Obesity, unspecified: Secondary | ICD-10-CM | POA: Insufficient documentation

## 2016-04-25 DIAGNOSIS — R0602 Shortness of breath: Secondary | ICD-10-CM | POA: Insufficient documentation

## 2016-04-25 DIAGNOSIS — Z91013 Allergy to seafood: Secondary | ICD-10-CM | POA: Insufficient documentation

## 2016-04-25 DIAGNOSIS — I2510 Atherosclerotic heart disease of native coronary artery without angina pectoris: Secondary | ICD-10-CM | POA: Diagnosis not present

## 2016-04-25 DIAGNOSIS — R509 Fever, unspecified: Secondary | ICD-10-CM | POA: Diagnosis not present

## 2016-04-25 DIAGNOSIS — I959 Hypotension, unspecified: Secondary | ICD-10-CM | POA: Diagnosis not present

## 2016-04-25 DIAGNOSIS — I5022 Chronic systolic (congestive) heart failure: Secondary | ICD-10-CM | POA: Insufficient documentation

## 2016-04-25 DIAGNOSIS — R5383 Other fatigue: Secondary | ICD-10-CM | POA: Insufficient documentation

## 2016-04-25 DIAGNOSIS — Z794 Long term (current) use of insulin: Secondary | ICD-10-CM | POA: Insufficient documentation

## 2016-04-25 DIAGNOSIS — J029 Acute pharyngitis, unspecified: Secondary | ICD-10-CM | POA: Insufficient documentation

## 2016-04-25 DIAGNOSIS — Z79899 Other long term (current) drug therapy: Secondary | ICD-10-CM | POA: Diagnosis not present

## 2016-04-25 DIAGNOSIS — Z833 Family history of diabetes mellitus: Secondary | ICD-10-CM | POA: Diagnosis not present

## 2016-04-25 DIAGNOSIS — R000 Tachycardia, unspecified: Secondary | ICD-10-CM | POA: Diagnosis not present

## 2016-04-25 DIAGNOSIS — N289 Disorder of kidney and ureter, unspecified: Secondary | ICD-10-CM | POA: Insufficient documentation

## 2016-04-25 DIAGNOSIS — D509 Iron deficiency anemia, unspecified: Secondary | ICD-10-CM | POA: Insufficient documentation

## 2016-04-25 DIAGNOSIS — Z8541 Personal history of malignant neoplasm of cervix uteri: Secondary | ICD-10-CM | POA: Insufficient documentation

## 2016-04-25 DIAGNOSIS — Z87891 Personal history of nicotine dependence: Secondary | ICD-10-CM | POA: Insufficient documentation

## 2016-04-25 DIAGNOSIS — D259 Leiomyoma of uterus, unspecified: Secondary | ICD-10-CM | POA: Insufficient documentation

## 2016-04-25 DIAGNOSIS — E785 Hyperlipidemia, unspecified: Secondary | ICD-10-CM | POA: Insufficient documentation

## 2016-04-25 DIAGNOSIS — E119 Type 2 diabetes mellitus without complications: Secondary | ICD-10-CM | POA: Diagnosis not present

## 2016-04-25 DIAGNOSIS — Z801 Family history of malignant neoplasm of trachea, bronchus and lung: Secondary | ICD-10-CM | POA: Insufficient documentation

## 2016-04-25 DIAGNOSIS — I5032 Chronic diastolic (congestive) heart failure: Secondary | ICD-10-CM

## 2016-04-25 DIAGNOSIS — Z8042 Family history of malignant neoplasm of prostate: Secondary | ICD-10-CM | POA: Insufficient documentation

## 2016-04-25 DIAGNOSIS — Z7984 Long term (current) use of oral hypoglycemic drugs: Secondary | ICD-10-CM | POA: Diagnosis not present

## 2016-04-25 DIAGNOSIS — I110 Hypertensive heart disease with heart failure: Secondary | ICD-10-CM | POA: Diagnosis present

## 2016-04-25 DIAGNOSIS — Z793 Long term (current) use of hormonal contraceptives: Secondary | ICD-10-CM | POA: Insufficient documentation

## 2016-04-25 DIAGNOSIS — N181 Chronic kidney disease, stage 1: Secondary | ICD-10-CM

## 2016-04-25 DIAGNOSIS — E1122 Type 2 diabetes mellitus with diabetic chronic kidney disease: Secondary | ICD-10-CM

## 2016-04-25 MED ORDER — INSULIN GLARGINE 100 UNIT/ML ~~LOC~~ SOLN: 60.0000 [IU] | Freq: Every day | SUBCUTANEOUS | Status: DC

## 2016-04-25 MED ORDER — "INSULIN SYRINGE 31G X 5/16"" 0.5 ML MISC": 1.0000 | Freq: Every day | Status: DC

## 2016-04-25 NOTE — Telephone Encounter (Signed)
Patient called and stated that she really did not like the Toujeo. She states that her blood sugars have been a little higher than normal and it caused her to have a sore throat. Patient states she would like to do the lantus vials and will need syringes also. Pharmacy is [facility].

## 2016-04-25 NOTE — Progress Notes (Signed)
Subjective:    Patient ID: [name] [name], female    DOB: 02/18/1981, 35 y.o.   MRN: [id number]  Congestive Heart Failure Presents for follow-up visit. The disease course has been stable. Associated symptoms include edema, fatigue and shortness of breath. Pertinent negatives include no abdominal pain, chest pain, chest pressure, orthopnea or palpitations. The symptoms have been worsening. Past treatments include beta blockers, angiotensin receptor blockers and salt and fluid restriction. The treatment provided moderate relief. Compliance with prior treatments has been good. Her past medical history is significant for anemia, CAD, DM and HTN. There is no history of chronic lung disease. Compliance problems include medication cost.   Other This is a new (sore throat) problem. The current episode started today. The problem has been unchanged. Associated symptoms include congestion, fatigue, a fever, headaches, a sore throat and weakness. Pertinent negatives include no abdominal pain, chest pain, coughing, neck pain or rash. Nothing aggravates the symptoms. She has tried acetaminophen for the symptoms. The treatment provided mild relief.   Past Medical History  Diagnosis Date  . CAD (coronary artery disease)     a. NSTEMI 03/2015: mLCx 100% s/p PCI/DES, RI 90% s/p PCI/DES, EF 35%; b. 04/2015 Relook Cath: LM nl, LAD 40p, RI patent stent, LCX patent stent, RCA nl, EF 55-65%.  . Chronic systolic CHF (congestive heart failure) ([place])     a. echo 03/2015: EF 30-35%, mild concentric LVH, severe HK of inf, inflat, & lat walls, mod MR, mild TR;  b. 04/2015 LV Gram: EF 55-65%.  . Ischemic cardiomyopathy     a. 03/2015 EF 30-35% post NSTEMI;  b. 04/2015 EF 55-65% on LV gram.  . DM2 (diabetes mellitus, type 2) ([place])     a. since 2002  . Tobacco abuse     a. quit 03/2015.  . Obesity   . Hyperlipidemia   . Hypotension     a. resulting in discontinuation of losartan and reduction in coreg dose.  Marland Kitchen Heavy menses    a. H/O IUD - expired in 2014 - remains in place.  . Iron deficiency anemia   . Kidney disease   . Uterine fibroid   . Hypertension   . MI (myocardial infarction) ([place]) 03/29/2015  . Renal insufficiency     Past Surgical History  Procedure Laterality Date  . Mouth surgery    . Cardiac catheterization  4/16    x2 stent [facility]  . Cardiac catheterization N/A 05/05/2015    Procedure: Left Heart Cath and Coronary Angiography;  Surgeon: Peter M Martinique, MD;  Location: [facility];  Service: Cardiovascular;  Laterality: N/A;    Family History  Problem Relation Age of Onset  . Diabetes Father   . Cancer Maternal Grandmother     lung  . Cancer Maternal Grandfather     prostate  . Diabetes Paternal Grandfather   . Cancer - Cervical Maternal Aunt     Social History  Substance Use Topics  . Smoking status: Former Smoker -- 15 years    Quit date: 03/10/2015  . Smokeless tobacco: Never Used  . Alcohol Use: No   Allergies  Allergen Reactions  . Lisinopril Anaphylaxis, Itching and Swelling  . Shellfish Allergy Itching and Swelling    Prior to Admission medications   Medication Sig Start Date End Date Taking? Authorizing Provider  aspirin EC 81 MG tablet Take 81 mg by mouth daily.   Yes Historical Provider, MD  atorvastatin (LIPITOR) 20 MG tablet Take 1 tablet (20  mg total) by mouth daily. 01/22/16  Yes Wellington Hampshire, MD  blood glucose meter kit and supplies KIT Dispense based on patient and insurance preference. Use up to four times daily as directed. E11.22. 03/27/16  Yes Kathrine Haddock, NP  carvedilol (COREG) 6.25 MG tablet Take 1 tablet (6.25 mg total) by mouth 2 (two) times daily. 03/25/16  Yes Alisa Graff, FNP  etonogestrel (NEXPLANON) 68 MG IMPL implant 1 each by Subdermal route once.   Yes Historical Provider, MD  ferrous sulfate 325 (65 FE) MG tablet Take 1 tablet (325 mg total) by mouth 3 (three) times daily with meals. Patient taking differently: Take 325 mg by mouth  daily with breakfast.  03/06/16  Yes Kathrine Haddock, NP  furosemide (LASIX) 20 MG tablet Take 1 tablet (20 mg total) by mouth as needed for fluid (no more than twice a week). 04/23/16  Yes Wellington Hampshire, MD  glimepiride (AMARYL) 4 MG tablet Take 1 tablet (4 mg total) by mouth daily. 03/25/16  Yes Kathrine Haddock, NP  glucose blood (ACCU-CHEK ACTIVE STRIPS) test strip Use as instructed 03/25/16  Yes Kathrine Haddock, NP  Insulin Glargine (LANTUS SOLOSTAR) 100 UNIT/ML Solostar Pen Inject 60 Units into the skin daily at 10 pm. Start with 20 units QHS.  Titrate to AM blood sugar Patient taking differently: Inject 20 Units into the skin daily at 10 pm. Start with 20 units QHS.  Titrate to AM blood sugar 03/06/16  Yes Kathrine Haddock, NP  loperamide (IMODIUM) 2 MG capsule Take 2 mg by mouth as needed for diarrhea or loose stools.   Yes Historical Provider, MD  losartan (COZAAR) 50 MG tablet Take 1 tablet (50 mg total) by mouth daily. 10/23/15  Yes Wellington Hampshire, MD  Melatonin 5 MG TABS Take 5 mg by mouth daily.   Yes Historical Provider, MD  nitroGLYCERIN (NITROSTAT) 0.4 MG SL tablet Place 1 tablet (0.4 mg total) under the tongue every 5 (five) minutes as needed for chest pain. Reported on 02/20/2016 03/11/16  Yes Wellington Hampshire, MD  ondansetron (ZOFRAN) 4 MG tablet Take 1 tablet (4 mg total) by mouth daily as needed. 04/12/16  Yes Orbie Pyo, MD  pantoprazole (PROTONIX) 40 MG tablet Take 1 tablet (40 mg total) by mouth daily at 6 (six) AM. 03/25/16  Yes Kathrine Haddock, NP  ticagrelor (BRILINTA) 90 MG TABS tablet Take 1 tablet (90 mg total) by mouth 2 (two) times daily. 04/17/16  Yes Wellington Hampshire, MD  venlafaxine XR (EFFEXOR-XR) 37.5 MG 24 hr capsule Take 37.5 mg by mouth daily with breakfast.   Yes Historical Provider, MD      Review of Systems  Constitutional: Positive for fever, appetite change (decreased) and fatigue.  HENT: Positive for congestion, postnasal drip and sore throat. Negative for  ear pain.   Eyes: Negative.   Respiratory: Positive for chest tightness and shortness of breath. Negative for cough.   Cardiovascular: Positive for leg swelling. Negative for chest pain and palpitations.  Gastrointestinal: Positive for diarrhea. Negative for abdominal pain and abdominal distention.  Endocrine: Negative.   Genitourinary: Negative.   Musculoskeletal: Negative for back pain and neck pain.  Skin: Negative.  Negative for rash.  Allergic/Immunologic: Negative.   Neurological: Positive for weakness and headaches. Negative for dizziness and light-headedness.  Hematological: Negative for adenopathy. Does not bruise/bleed easily.  Psychiatric/Behavioral: Positive for sleep disturbance (trouble falling asleep; sleeping on 2 pillows) and dysphoric mood. The patient is not nervous/anxious.  Objective:   Physical Exam  Constitutional: She is oriented to person, place, and time. She appears well-developed and well-nourished.  HENT:  Head: Normocephalic and atraumatic.  Eyes: Conjunctivae are normal. Pupils are equal, round, and reactive to light.  Neck: Normal range of motion. Neck supple.  Cardiovascular: Regular rhythm.  Tachycardia present.   Pulmonary/Chest: Effort normal. She has no wheezes. She has no rales.  Abdominal: Soft. She exhibits no distension. There is no tenderness.  Musculoskeletal: She exhibits edema (1+ pitting edema in bilateral lower legs). She exhibits no tenderness.  Neurological: She is alert and oriented to person, place, and time.  Skin: Skin is warm and dry.  Psychiatric: She has a normal mood and affect. Her behavior is normal. Thought content normal.  Nursing note and vitals reviewed.   BP 112/72 mmHg  Pulse 90  Resp 20  Ht '5\' 4"'$  (1.626 m)  Wt 201 lb (91.173 kg)  BMI 34.48 kg/m2  SpO2 100%  LMP 03/09/2016 (Exact Date)       Assessment & Plan:  1: Chronic heart failure with preserved ejection fraction- Patient presents with fatigue  and shortness of breath upon exertion. She says that when she does get symptoms, she will stop what she's doing to rest until symptoms get better. Was a little short of breath upon walking into the office today but once she sat down for a few minutes, her symptoms improved. She continues to have some swelling in her lower legs but says that she hasn't picked up her furosemide prescription because she can't afford the $3.00 copay. In the meantime, she was encouraged to elevate her legs when sitting as well as apply the compression stockings that she has with removing them at bedtime. She understands to take the furosemide only a couple of times per week per cardiology recommendation. She continues to weigh herself and has noticed a little weight gain but nothing overnight. By our scale, she's gained 3 pounds since she was last here on 03/19/16. Reminded to call for an overnight weight gain of >2 pounds or a weekly weight gain of >5 pounds. She is not adding salt to her food. [facility] went in and reviewed medications with the patient.  2: Diabetes- She hasn't checked her glucose lately because she says that she's out of test strips. Is currently taking Toujeo but wants to switch back to her lantus. Advised her to call her PCP regarding this and she says that she will.  3: CAD- She says that she took her last dose of Brilinta this morning and is unable to afford the $3.00 copay. She received samples from cardiology about a week ago which she is finishing today. Discussed the importance of not running out of this medication and advised her to stop by cardiology's office to see if they have any more samples and she says that she will do that. 4: Sore throat- She says that this started this morning along with a fever. She took some tylenol with resolution of the fever but she still has a sore throat. Advised her to monitor symptom and should it worsen or her fever return to contact her PCP.  At the end of the  visit, she mentions that she sweats often even when doing very little activity. She says that her mom went through early menopause at the age of 44. Encouraged her to make an appointment with her PCP to discuss this further.   Patient did not bring her medications nor a list.  Each medication was verbally reviewed with the patient and she was encouraged to bring the bottles to every visit to confirm accuracy of list.  Return here in 3 months or sooner for any questions/problems before then.

## 2016-04-25 NOTE — Telephone Encounter (Signed)
Sent an order in.  Please ask the pharmacy if it is done correctly

## 2016-04-25 NOTE — Patient Instructions (Signed)
Continue weighing daily and call for an overnight weight gain of > 2 pounds or a weekly weight gain of >5 pounds. 

## 2016-04-26 MED ORDER — "INSULIN SYRINGE 31G X 5/16"" 1 ML MISC": 1.0000 [IU] | Freq: Every day | Status: DC

## 2016-04-26 NOTE — Telephone Encounter (Signed)
Called pharmacy and they said that the rx for lantus was correct but they need a new rx for the syringes because the one we sent will only allow the patient to draw up 50 units when she is doing 60. So they need a new rx for the syringes and they need to be for 1 ml.

## 2016-06-03 ENCOUNTER — Encounter: Payer: Self-pay | Admitting: Unknown Physician Specialty

## 2016-06-03 ENCOUNTER — Ambulatory Visit (INDEPENDENT_AMBULATORY_CARE_PROVIDER_SITE_OTHER): Payer: Medicaid Other | Admitting: Unknown Physician Specialty

## 2016-06-03 VITALS — BP 118/81 | HR 90 | Temp 98.5°F | Ht 66.0 in | Wt 207.6 lb

## 2016-06-03 DIAGNOSIS — N181 Chronic kidney disease, stage 1: Secondary | ICD-10-CM

## 2016-06-03 DIAGNOSIS — E1165 Type 2 diabetes mellitus with hyperglycemia: Secondary | ICD-10-CM

## 2016-06-03 DIAGNOSIS — E1169 Type 2 diabetes mellitus with other specified complication: Secondary | ICD-10-CM

## 2016-06-03 DIAGNOSIS — E785 Hyperlipidemia, unspecified: Secondary | ICD-10-CM

## 2016-06-03 DIAGNOSIS — E1122 Type 2 diabetes mellitus with diabetic chronic kidney disease: Secondary | ICD-10-CM

## 2016-06-03 DIAGNOSIS — N921 Excessive and frequent menstruation with irregular cycle: Secondary | ICD-10-CM

## 2016-06-03 DIAGNOSIS — Z794 Long term (current) use of insulin: Secondary | ICD-10-CM | POA: Diagnosis not present

## 2016-06-03 DIAGNOSIS — E669 Obesity, unspecified: Secondary | ICD-10-CM | POA: Diagnosis not present

## 2016-06-03 DIAGNOSIS — IMO0002 Reserved for concepts with insufficient information to code with codable children: Secondary | ICD-10-CM

## 2016-06-03 DIAGNOSIS — D509 Iron deficiency anemia, unspecified: Secondary | ICD-10-CM

## 2016-06-03 LAB — BAYER DCA HB A1C WAIVED: HB A1C (BAYER DCA - WAIVED): 7.4 % — ABNORMAL HIGH (ref <7.0)

## 2016-06-03 NOTE — Assessment & Plan Note (Signed)
Check CBC.  Taking iron supplements

## 2016-06-03 NOTE — Assessment & Plan Note (Signed)
Hgb A1C is 7.4 and down from 10.  However, pt is getting hypoglycemic on insulin.  Stop insulin and start Trulicity.  Samples of .75 and 1.5 given and will start  the .75.  She will call the lifestyle center.

## 2016-06-03 NOTE — Progress Notes (Signed)
BP 118/81 mmHg  Pulse 90  Temp(Src) 98.5 F (36.9 C)  Ht 5\' 6"  (1.676 m)  Wt 207 lb 9.6 oz (94.167 kg)  BMI 33.52 kg/m2  SpO2 99%  LMP 03/09/2016 (Exact Date)   Subjective:    Patient ID: [name] [name], female    DOB: [birth year], 35 y.o.   MRN: [id number]  HPI: [name] [name] is a 35 y.o. female  Chief Complaint  Patient presents with  . Diabetes  . Hyperlipidemia  . Hypertension   Diabetes: States if she doesn't eat carbs, she doesn't take insulin.  When she does eat carbs she takes 10-12 units.  States she takes it 4-5 days.   States she feels hypoglycemic a lot with sweating, dizzy, and fatigue.  This is resolved with eating Feet problems:none Blood Sugars averaging: varies eye exam within last year yes Last Hgb A1C: 10.0  Hypertension  Using medications without difficulty Average home BPs Normally 110/80 range  Using medication without problems or lightheadedness No chest pain with exertion or shortness of breath No Edema  Elevated Cholesterol Using medications without problems No Muscle aches  Diet: not consistant but monitoring carbs Exercise:walks up stairs.  Tries to get cardio     Relevant past medical, surgical, family and social history reviewed and updated as indicated. Interim medical history since our last visit reviewed. Allergies and medications reviewed and updated.  Review of Systems  Per HPI unless specifically indicated above     Objective:    BP 118/81 mmHg  Pulse 90  Temp(Src) 98.5 F (36.9 C)  Ht 5\' 6"  (1.676 m)  Wt 207 lb 9.6 oz (94.167 kg)  BMI 33.52 kg/m2  SpO2 99%  LMP 03/09/2016 (Exact Date)  Wt Readings from Last 3 Encounters:  06/03/16 207 lb 9.6 oz (94.167 kg)  04/25/16 201 lb (91.173 kg)  04/19/16 197 lb (89.359 kg)    Physical Exam  Constitutional: She is oriented to person, place, and time. She appears well-developed and well-nourished. No distress.  HENT:  Head: Normocephalic and atraumatic.  Eyes:  Conjunctivae and lids are normal. Right eye exhibits no discharge. Left eye exhibits no discharge. No scleral icterus.  Neck: Normal range of motion. Neck supple. No JVD present. Carotid bruit is not present.  Cardiovascular: Normal rate, regular rhythm and normal heart sounds.   Pulmonary/Chest: Effort normal and breath sounds normal.  Abdominal: Normal appearance. There is no splenomegaly or hepatomegaly.  Musculoskeletal: Normal range of motion.  Neurological: She is alert and oriented to person, place, and time.  Skin: Skin is warm, dry and intact. No rash noted. No pallor.  Psychiatric: She has a normal mood and affect. Her behavior is normal. Judgment and thought content normal.    Results for orders placed or performed during the hospital encounter of [id number]  Basic metabolic panel  Result Value Ref Range   Sodium 138 135 - 145 mmol/L   Potassium 3.8 3.5 - 5.1 mmol/L   Chloride 107 101 - 111 mmol/L   CO2 26 22 - 32 mmol/L   Glucose, Bld 153 (H) 65 - 99 mg/dL   BUN 23 (H) 6 - 20 mg/dL   Creatinine, Ser 1.20 (H) 0.44 - 1.00 mg/dL   Calcium 8.5 (L) 8.9 - 10.3 mg/dL   GFR calc non Af Amer 57 (L) >60 mL/min   GFR calc Af Amer >60 >60 mL/min   Anion gap 5 5 - 15  CBC  Result Value Ref Range   WBC 5.9 3.6 -  11.0 K/uL   RBC 4.32 3.80 - 5.20 MIL/uL   Hemoglobin 11.4 (L) 12.0 - 16.0 g/dL   HCT 34.5 (L) 35.0 - 47.0 %   MCV 79.8 (L) 80.0 - 100.0 fL   MCH 26.4 26.0 - 34.0 pg   MCHC 33.1 32.0 - 36.0 g/dL   RDW 15.0 (H) 11.5 - 14.5 %   Platelets 307 150 - 440 K/uL  Troponin I  Result Value Ref Range   Troponin I <0.03 <0.031 ng/mL  Troponin I  Result Value Ref Range   Troponin I <0.03 <0.031 ng/mL      Assessment & Plan:   Problem List Items Addressed This Visit      Unprioritized   Diabetes type 2, controlled ([facility])    Pt getting hypoglycemic with periods of sweating, fatigue, and dizzy      Heavy menses    Check CBC.  Taking iron supplements      Hyperlipidemia -  Primary   Iron deficiency anemia   Relevant Orders   CBC with Differential/Platelet   Obesity   Uncontrolled type 2 diabetes mellitus, with long-term current use of insulin ([facility])    Hgb A1C is 7.4 and down from 10.  However, pt is getting hypoglycemic on insulin.  Stop insulin and start Trulicity.  Samples of .75 and 1.5 given and will start  the .75.  She will call the lifestyle center.        Relevant Orders   Bayer DCA Hb A1c Waived   Comprehensive metabolic panel       Follow up plan: Return in about 4 weeks (around 07/01/2016).        +-+

## 2016-06-03 NOTE — Assessment & Plan Note (Signed)
Pt getting hypoglycemic with periods of sweating, fatigue, and dizzy

## 2016-06-04 LAB — CBC WITH DIFFERENTIAL/PLATELET
Basophils Absolute: 0 10*3/uL (ref 0.0–0.2)
Basos: 1 %
EOS (ABSOLUTE): 0.3 10*3/uL (ref 0.0–0.4)
Eos: 5 %
Hematocrit: 31 % — ABNORMAL LOW (ref 34.0–46.6)
Hemoglobin: 9.9 g/dL — ABNORMAL LOW (ref 11.1–15.9)
Immature Grans (Abs): 0 10*3/uL (ref 0.0–0.1)
Immature Granulocytes: 0 %
Lymphocytes Absolute: 1.9 10*3/uL (ref 0.7–3.1)
Lymphs: 30 %
MCH: 26.6 pg (ref 26.6–33.0)
MCHC: 31.9 g/dL (ref 31.5–35.7)
MCV: 83 fL (ref 79–97)
Monocytes Absolute: 0.4 10*3/uL (ref 0.1–0.9)
Monocytes: 7 %
Neutrophils Absolute: 3.6 10*3/uL (ref 1.4–7.0)
Neutrophils: 57 %
Platelets: 298 10*3/uL (ref 150–379)
RBC: 3.72 x10E6/uL — ABNORMAL LOW (ref 3.77–5.28)
RDW: 15.1 % (ref 12.3–15.4)
WBC: 6.3 10*3/uL (ref 3.4–10.8)

## 2016-06-04 LAB — COMPREHENSIVE METABOLIC PANEL
ALT: 102 IU/L — ABNORMAL HIGH (ref 0–32)
AST: 62 IU/L — ABNORMAL HIGH (ref 0–40)
Albumin/Globulin Ratio: 1.2 (ref 1.2–2.2)
Albumin: 3.1 g/dL — ABNORMAL LOW (ref 3.5–5.5)
Alkaline Phosphatase: 244 IU/L — ABNORMAL HIGH (ref 39–117)
BUN/Creatinine Ratio: 28 — ABNORMAL HIGH (ref 9–23)
BUN: 31 mg/dL — ABNORMAL HIGH (ref 6–20)
Bilirubin Total: <0.2 mg/dL (ref 0.0–1.2)
CO2: 18 mmol/L (ref 18–29)
Calcium: 8.6 mg/dL — ABNORMAL LOW (ref 8.7–10.2)
Chloride: 107 mmol/L — ABNORMAL HIGH (ref 96–106)
Creatinine, Ser: 1.09 mg/dL — ABNORMAL HIGH (ref 0.57–1.00)
GFR calc Af Amer: 75 mL/min/{1.73_m2} (ref >59)
GFR calc non Af Amer: 65 mL/min/{1.73_m2} (ref >59)
Globulin, Total: 2.6 g/dL (ref 1.5–4.5)
Glucose: 183 mg/dL — ABNORMAL HIGH (ref 65–99)
Potassium: 4.3 mmol/L (ref 3.5–5.2)
Sodium: 137 mmol/L (ref 134–144)
Total Protein: 5.7 g/dL — ABNORMAL LOW (ref 6.0–8.5)

## 2016-06-05 ENCOUNTER — Telehealth: Payer: Self-pay | Admitting: Unknown Physician Specialty

## 2016-06-05 DIAGNOSIS — R7989 Other specified abnormal findings of blood chemistry: Secondary | ICD-10-CM | POA: Insufficient documentation

## 2016-06-05 DIAGNOSIS — R945 Abnormal results of liver function studies: Principal | ICD-10-CM

## 2016-06-05 LAB — ALKALINE PHOSPHATASE, ISOENZYMES
Alkaline Phosphatase: 240 IU/L — ABNORMAL HIGH (ref 39–117)
BONE FRACTION: 32 % (ref 14–68)
INTESTINAL FRAC.: 0 % (ref 0–18)
LIVER FRACTION: 68 % (ref 18–85)

## 2016-06-05 LAB — SPECIMEN STATUS REPORT

## 2016-06-05 LAB — GAMMA GT: GGT: 487 IU/L — ABNORMAL HIGH (ref 0–60)

## 2016-06-05 NOTE — Telephone Encounter (Signed)
Discussed with patient on the phone.  She has dramatic elevations in liver function tests (ALT,AST, GGT, Alk Phos) in the last 2 months.  Alk phos was 224 and 64 just 2 months ago.  She has significant diarrhea, sour stomach and poorly controlled DM.  Symptoms ongoing for a year but getting worse.   Due to significant and dramatic elevations in liver functions along with anemia, will refer to GI ASAP

## 2016-06-06 ENCOUNTER — Encounter: Payer: Self-pay | Admitting: Emergency Medicine

## 2016-06-06 ENCOUNTER — Emergency Department
Admission: EM | Admit: 2016-06-06 | Discharge: 2016-06-06 | Disposition: A | Discharge status: Home / Self Care | Payer: Medicaid Other | Attending: Emergency Medicine | Admitting: Emergency Medicine

## 2016-06-06 ENCOUNTER — Emergency Department: Payer: Medicaid Other

## 2016-06-06 DIAGNOSIS — I255 Ischemic cardiomyopathy: Secondary | ICD-10-CM | POA: Diagnosis not present

## 2016-06-06 DIAGNOSIS — J069 Acute upper respiratory infection, unspecified: Secondary | ICD-10-CM | POA: Diagnosis not present

## 2016-06-06 DIAGNOSIS — Z794 Long term (current) use of insulin: Secondary | ICD-10-CM | POA: Diagnosis not present

## 2016-06-06 DIAGNOSIS — Z7984 Long term (current) use of oral hypoglycemic drugs: Secondary | ICD-10-CM | POA: Insufficient documentation

## 2016-06-06 DIAGNOSIS — R1110 Vomiting, unspecified: Secondary | ICD-10-CM | POA: Diagnosis not present

## 2016-06-06 DIAGNOSIS — Z7982 Long term (current) use of aspirin: Secondary | ICD-10-CM | POA: Diagnosis not present

## 2016-06-06 DIAGNOSIS — K580 Irritable bowel syndrome with diarrhea: Secondary | ICD-10-CM | POA: Insufficient documentation

## 2016-06-06 DIAGNOSIS — I25119 Atherosclerotic heart disease of native coronary artery with unspecified angina pectoris: Secondary | ICD-10-CM | POA: Diagnosis not present

## 2016-06-06 DIAGNOSIS — E785 Hyperlipidemia, unspecified: Secondary | ICD-10-CM | POA: Diagnosis not present

## 2016-06-06 DIAGNOSIS — E119 Type 2 diabetes mellitus without complications: Secondary | ICD-10-CM | POA: Diagnosis not present

## 2016-06-06 DIAGNOSIS — Z79899 Other long term (current) drug therapy: Secondary | ICD-10-CM | POA: Diagnosis not present

## 2016-06-06 DIAGNOSIS — B9789 Other viral agents as the cause of diseases classified elsewhere: Secondary | ICD-10-CM

## 2016-06-06 DIAGNOSIS — I252 Old myocardial infarction: Secondary | ICD-10-CM | POA: Diagnosis not present

## 2016-06-06 DIAGNOSIS — R05 Cough: Secondary | ICD-10-CM | POA: Diagnosis present

## 2016-06-06 DIAGNOSIS — Z87891 Personal history of nicotine dependence: Secondary | ICD-10-CM | POA: Insufficient documentation

## 2016-06-06 DIAGNOSIS — J988 Other specified respiratory disorders: Secondary | ICD-10-CM

## 2016-06-06 DIAGNOSIS — I5042 Chronic combined systolic (congestive) and diastolic (congestive) heart failure: Secondary | ICD-10-CM | POA: Insufficient documentation

## 2016-06-06 DIAGNOSIS — I110 Hypertensive heart disease with heart failure: Secondary | ICD-10-CM | POA: Diagnosis not present

## 2016-06-06 LAB — CBC
HCT: 32.6 % — ABNORMAL LOW (ref 35.0–47.0)
Hemoglobin: 10.9 g/dL — ABNORMAL LOW (ref 12.0–16.0)
MCH: 27.6 pg (ref 26.0–34.0)
MCHC: 33.3 g/dL (ref 32.0–36.0)
MCV: 83 fL (ref 80.0–100.0)
Platelets: 343 10*3/uL (ref 150–440)
RBC: 3.93 MIL/uL (ref 3.80–5.20)
RDW: 14.9 % — ABNORMAL HIGH (ref 11.5–14.5)
WBC: 6.6 10*3/uL (ref 3.6–11.0)

## 2016-06-06 LAB — BASIC METABOLIC PANEL
Anion gap: 5 (ref 5–15)
BUN: 27 mg/dL — ABNORMAL HIGH (ref 6–20)
CO2: 23 mmol/L (ref 22–32)
Calcium: 8.7 mg/dL — ABNORMAL LOW (ref 8.9–10.3)
Chloride: 108 mmol/L (ref 101–111)
Creatinine, Ser: 1.35 mg/dL — ABNORMAL HIGH (ref 0.44–1.00)
GFR calc Af Amer: 58 mL/min — ABNORMAL LOW (ref >60)
GFR calc non Af Amer: 50 mL/min — ABNORMAL LOW (ref >60)
Glucose, Bld: 130 mg/dL — ABNORMAL HIGH (ref 65–99)
Potassium: 4.3 mmol/L (ref 3.5–5.1)
Sodium: 136 mmol/L (ref 135–145)

## 2016-06-06 LAB — TROPONIN I: Troponin I: <0.03 ng/mL (ref <0.03)

## 2016-06-06 MED ORDER — ONDANSETRON 4 MG PO TBDP: 4.0000 mg | ORAL_TABLET | Freq: Three times a day (TID) | ORAL | Status: DC | PRN

## 2016-06-06 MED ORDER — PREDNISONE 50 MG PO TABS: 50.0000 mg | ORAL_TABLET | Freq: Every day | ORAL | Status: DC

## 2016-06-06 MED ORDER — PSEUDOEPH-BROMPHEN-DM 30-2-10 MG/5ML PO SYRP: 10.0000 mL | ORAL_SOLUTION | Freq: Four times a day (QID) | ORAL | Status: DC | PRN

## 2016-06-06 NOTE — Discharge Instructions (Signed)
Viral Infections A viral infection can be caused by different types of viruses.Most viral infections are not serious and resolve on their own. However, some infections may cause severe symptoms and may lead to further complications. SYMPTOMS Viruses can frequently cause:  Minor sore throat.  Aches and pains.  Headaches.  Runny nose.  Different types of rashes.  Watery eyes.  Tiredness.  Cough.  Loss of appetite.  Gastrointestinal infections, resulting in nausea, vomiting, and diarrhea. These symptoms do not respond to antibiotics because the infection is not caused by bacteria. However, you might catch a bacterial infection following the viral infection. This is sometimes called a "superinfection." Symptoms of such a bacterial infection may include:  Worsening sore throat with pus and difficulty swallowing.  Swollen neck glands.  Chills and a high or persistent fever.  Severe headache.  Tenderness over the sinuses.  Persistent overall ill feeling (malaise), muscle aches, and tiredness (fatigue).  Persistent cough.  Yellow, green, or brown mucus production with coughing. HOME CARE INSTRUCTIONS   Only take over-the-counter or prescription medicines for pain, discomfort, diarrhea, or fever as directed by your caregiver.  Drink enough water and fluids to keep your urine clear or pale yellow. Sports drinks can provide valuable electrolytes, sugars, and hydration.  Get plenty of rest and maintain proper nutrition. Soups and broths with crackers or rice are fine. SEEK IMMEDIATE MEDICAL CARE IF:   You have severe headaches, shortness of breath, chest pain, neck pain, or an unusual rash.  You have uncontrolled vomiting, diarrhea, or you are unable to keep down fluids.  You or your child has an oral temperature above 102 F (38.9 C), not controlled by medicine.  Your baby is older than 3 months with a rectal temperature of 102 F (38.9 C) or higher.  Your baby is 35  months old or younger with a rectal temperature of 100.4 F (38 C) or higher. MAKE SURE YOU:   Understand these instructions.  Will watch your condition.  Will get help right away if you are not doing well or get worse.   This information is not intended to replace advice given to you by your health care provider. Make sure you discuss any questions you have with your health care provider.   Document Released: 09/04/2005 Document Revised: 02/17/2012 Document Reviewed: 05/03/2015 Elsevier Interactive Patient Education 2016 Else[employer]  Irritable Bowel Syndrome, Adult Irritable bowel syndrome (IBS) is not one specific disease. It is a group of symptoms that affects the organs responsible for digestion (gastrointestinal or GI tract).  To regulate how your GI tract works, your body sends signals back and forth between your intestines and your brain. If you have IBS, there may be a problem with these signals. As a result, your GI tract does not function normally. Your intestines may become more sensitive and overreact to certain things. This is especially true when you eat certain foods or when you are under stress.  There are four types of IBS. These may be determined based on the consistency of your stool:   IBS with diarrhea.   IBS with constipation.   Mixed IBS.   Unsubtyped IBS.  It is important to know which type of IBS you have. Some treatments are more likely to be helpful for certain types of IBS.  CAUSES  The exact cause of IBS is not known. RISK FACTORS You may have a higher risk of IBS if:  You are a woman.  You are younger than 36 years  old.  You have a family history of IBS.  You have mental health problems.  You have had bacterial infection of your GI tract. SIGNS AND SYMPTOMS  Symptoms of IBS vary from person to person. The main symptom is abdominal pain or discomfort. Additional symptoms usually include one or more of the following:   Diarrhea,  constipation, or both.   Abdominal swelling or bloating.   Feeling full or sick after eating a small or regular-size meal.   Frequent gas.   Mucus in the stool.   A feeling of having more stool left after a bowel movement.  Symptoms tend to come and go. They may be associated with stress, psychiatric conditions, or nothing at all.  DIAGNOSIS  There is no specific test to diagnose IBS. Your health care provider will make a diagnosis based on a physical exam, medical history, and your symptoms. You may have other tests to rule out other conditions that may be causing your symptoms. These may include:   Blood tests.   X-rays.   CT scan.  Endoscopy and colonoscopy. This is a test in which your GI tract is viewed with a long, thin, flexible tube. TREATMENT There is no cure for IBS, but treatment can help relieve symptoms. IBS treatment often includes:   Changes to your diet, such as:  Eating more fiber.  Avoiding foods that cause symptoms.  Drinking more water.  Eating regular, medium-sized portioned meals.  Medicines. These may include:  Fiber supplements if you have constipation.  Medicine to control diarrhea (antidiarrheal medicines).  Medicine to help control muscle spasms in your GI tract (antispasmodic medicines).  Medicines to help with any mental health issues, such as antidepressants or tranquilizers.  Therapy.  Talk therapy may help with anxiety, depression, or other mental health issues that can make IBS symptoms worse.  Stress reduction.  Managing your stress can help keep symptoms under control. HOME CARE INSTRUCTIONS   Take medicines only as directed by your health care provider.  Eat a healthy diet.  Avoid foods and drinks with added sugar.  Include more whole grains, fruits, and vegetables gradually into your diet. This may be especially helpful if you have IBS with constipation.  Avoid any foods and drinks that make your symptoms  worse. These may include dairy products and caffeinated or carbonated drinks.  Do not eat large meals.  Drink enough fluid to keep your urine clear or pale yellow.  Exercise regularly. Ask your health care provider for recommendations of good activities for you.  Keep all follow-up visits as directed by your health care provider. This is important. SEEK MEDICAL CARE IF:   You have constant pain.  You have trouble or pain with swallowing.  You have worsening diarrhea. SEEK IMMEDIATE MEDICAL CARE IF:   You have severe and worsening abdominal pain.   You have diarrhea and:   You have a rash, stiff neck, or severe headache.   You are irritable, sleepy, or difficult to awaken.   You are weak, dizzy, or extremely thirsty.   You have bright red blood in your stool or you have black tarry stools.   You have unusual abdominal swelling that is painful.   You vomit continuously.   You vomit blood (hematemesis).   You have both abdominal pain and a fever.    This information is not intended to replace advice given to you by your health care provider. Make sure you discuss any questions you have with your health care  provider.   Document Released: 11/25/2005 Document Revised: 12/16/2014 Document Reviewed: 08/12/2014 Elsevier Interactive Patient Education 2016 [place].  Diet for Irritable Bowel Syndrome When you have irritable bowel syndrome (IBS), the foods you eat and your eating habits are very important. IBS may cause various symptoms, such as abdominal pain, constipation, or diarrhea. Choosing the right foods can help ease discomfort caused by these symptoms. Work with your health care provider and dietitian to find the best eating plan to help control your symptoms. WHAT GENERAL GUIDELINES DO I NEED TO FOLLOW?  Keep a food diary. This will help you identify foods that cause symptoms. Write down:  What you eat and when.  What symptoms you have.  When  symptoms occur in relation to your meals.  Avoid foods that cause symptoms. Talk with your dietitian about other ways to get the same nutrients that are in these foods.  Eat more foods that contain fiber. Take a fiber supplement if directed by your dietitian.  Eat your meals slowly, in a relaxed setting.  Aim to eat 5-6 small meals per day. Do not skip meals.  Drink enough fluids to keep your urine clear or pale yellow.  Ask your health care provider if you should take an over-the-counter probiotic during flare-ups to help restore healthy gut bacteria.  If you have cramping or diarrhea, try making your meals low in fat and high in carbohydrates. Examples of carbohydrates are pasta, rice, whole grain breads and cereals, fruits, and vegetables.  If dairy products cause your symptoms to flare up, try eating less of them. You might be able to handle yogurt better than other dairy products because it contains bacteria that help with digestion. WHAT FOODS ARE NOT RECOMMENDED? The following are some foods and drinks that may worsen your symptoms:  Fatty foods, such as [place] fries.  Milk products, such as cheese or ice cream.  Chocolate.  Alcohol.  Products with caffeine, such as coffee.  Carbonated drinks, such as soda. The items listed above may not be a complete list of foods and beverages to avoid. Contact your dietitian for more information. WHAT FOODS ARE GOOD SOURCES OF FIBER? Your health care provider or dietitian may recommend that you eat more foods that contain fiber. Fiber can help reduce constipation and other IBS symptoms. Add foods with fiber to your diet a little at a time so that your body can get used to them. Too much fiber at once might cause gas and swelling of your abdomen. The following are some foods that are good sources of fiber:  Apples.  Peaches.  Pears.  Berries.  Figs.  Broccoli (raw).  Cabbage.  Carrots.  Raw peas.  Kidney beans.  [place]  beans.  Whole grain bread.  Whole grain cereal. FOR MORE INFORMATION  International Foundation for Functional Gastrointestinal Disorders: www.iffgd.[employer] and [employer]estive [employer] Kidney [employer]: NetworkAffair.co.za.aspx   This information is not intended to replace advice given to you by your health care provider. Make sure you discuss any questions you have with your health care provider.   Document Released: 02/15/2004 Document Revised: 12/16/2014 Document Reviewed: 02/25/2014 Elsevier Interactive Patient Education [employer].

## 2016-06-06 NOTE — ED Notes (Addendum)
Pt reports diarrhea and vomiting for over a year - in the last 24 hour loose stool x1 and vomited (only when she coughs) x6 (clear thick mucus) - earlier today chills and cough -- sharp pains in back with cough (cough for the last few days) - pt denies chest pain or cardiac history

## 2016-06-06 NOTE — ED Notes (Signed)
Today c/o chills, productive cough, back pain.

## 2016-06-06 NOTE — ED Notes (Signed)
Pt discharged to home.  Family member driving.  Discharge instructions reviewed.  Verbalized understanding.  No questions or concerns at this time.  Teach back verified.  Pt in NAD.  No items left in ED.   

## 2016-06-06 NOTE — ED Provider Notes (Signed)
[facility] Emergency Department Provider Note  ____________________________________________  Time seen: Approximately 8:48 PM  I have reviewed the triage vital signs and the nursing notes.   HISTORY  Chief Complaint Cough and Back Pain    HPI [name] [name] is a 35 y.o. female who presents emergency department complaining of cough, anterior chest wall pain, posttussive emesis for several days. Patient also reports that she has continued diarrhea and vomiting times one year. This is being followed by her primary care provider who has a referral to a GI specialist. Patient reports that she is mainly concerned due to the coughing, chest pain. Patient denies any headache, and nasal congestion, ear pain, sore throat, difficulty breathing or swallowing. Patient reports epigastric pain with coughing episodes not continual pain. She denies any bloody diarrhea. She denies any blood in the emesis.   Past Medical History  Diagnosis Date  . CAD (coronary artery disease)     a. NSTEMI 03/2015: mLCx 100% s/p PCI/DES, RI 90% s/p PCI/DES, EF 35%; b. 04/2015 Relook Cath: LM nl, LAD 40p, RI patent stent, LCX patent stent, RCA nl, EF 55-65%.  . Chronic systolic CHF (congestive heart failure) ([place])     a. echo 03/2015: EF 30-35%, mild concentric LVH, severe HK of inf, inflat, & lat walls, mod MR, mild TR;  b. 04/2015 LV Gram: EF 55-65%.  . Ischemic cardiomyopathy     a. 03/2015 EF 30-35% post NSTEMI;  b. 04/2015 EF 55-65% on LV gram.  . DM2 (diabetes mellitus, type 2) ([place])     a. since 2002  . Tobacco abuse     a. quit 03/2015.  . Obesity   . Hyperlipidemia   . Hypotension     a. resulting in discontinuation of losartan and reduction in coreg dose.  Marland Kitchen Heavy menses     a. H/O IUD - expired in 2014 - remains in place.  . Iron deficiency anemia   . Kidney disease   . Uterine fibroid   . Hypertension   . MI (myocardial infarction) ([place]) 03/29/2015  . Renal insufficiency      Patient Active Problem List   Diagnosis Date Noted  . Elevated liver function tests 06/05/2016  . Uncontrolled type 2 diabetes mellitus, with long-term current use of insulin ([place]) 06/03/2016  . Anemia 04/03/2016  . Chronic diastolic heart failure ([place]) 03/19/2016  . Tachycardia 03/19/2016  . IBS (irritable bowel syndrome) 12/15/2015  . Proteinuria 08/30/2015  . Coronary artery disease involving native coronary artery of native heart with angina pectoris with documented spasm ([place])   . Angina pectoris ([place])   . Iron deficiency anemia   . Heavy menses   . SOB (shortness of breath) 08/21/2015  . Chest pain 05/05/2015  . CAD in native artery 05/05/2015  . Hyperlipidemia 04/13/2015  . Ischemic cardiomyopathy   . Diabetes type 2, controlled ([place])   . Obesity     Past Surgical History  Procedure Laterality Date  . Mouth surgery    . Cardiac catheterization  4/16    x2 stent [facility]  . Cardiac catheterization N/A 05/05/2015    Procedure: Left Heart Cath and Coronary Angiography;  Surgeon: Peter M Martinique, MD;  Location: [facility];  Service: Cardiovascular;  Laterality: N/A;    Current Outpatient Rx  Name  Route  Sig  Dispense  Refill  . aspirin EC 81 MG tablet   Oral   Take 81 mg by mouth daily.         Marland Kitchen  atorvastatin (LIPITOR) 20 MG tablet   Oral   Take 1 tablet (20 mg total) by mouth daily.   30 tablet   6   . blood glucose meter kit and supplies KIT      Dispense based on patient and insurance preference. Use up to four times daily as directed. E11.22.   1 each   0   . brompheniramine-pseudoephedrine-DM 30-2-10 MG/5ML syrup   Oral   Take 10 mLs by mouth 4 (four) times daily as needed.   200 mL   0   . carvedilol (COREG) 6.25 MG tablet   Oral   Take 1 tablet (6.25 mg total) by mouth 2 (two) times daily.   60 tablet   3   . etonogestrel (NEXPLANON) 68 MG IMPL implant   Subdermal   1 each by Subdermal route once.         . ferrous sulfate 325  (65 FE) MG tablet   Oral   Take 1 tablet (325 mg total) by mouth 3 (three) times daily with meals. Patient taking differently: Take 325 mg by mouth daily with breakfast.    90 tablet   3   . furosemide (LASIX) 20 MG tablet   Oral   Take 1 tablet (20 mg total) by mouth as needed for fluid (no more than twice a week).   30 tablet   3   . glimepiride (AMARYL) 4 MG tablet   Oral   Take 1 tablet (4 mg total) by mouth daily.   90 tablet   1   . glucose blood (ACCU-CHEK ACTIVE STRIPS) test strip      Use as instructed   100 each   12   . insulin glargine (LANTUS) 100 UNIT/ML injection   Subcutaneous   Inject 0.6 mLs (60 Units total) into the skin at bedtime. Titrate based on AM blood sugar.  Provide with insulin needles and syringes-100   10 mL   11   . Insulin Syringe-Needle U-100 (INSULIN SYRINGE 1CC/31GX5/16") 31G X 5/16" 1 ML MISC   Does not apply   1 Units by Does not apply route at bedtime.   90 each   3   . loperamide (IMODIUM) 2 MG capsule   Oral   Take 2 mg by mouth as needed for diarrhea or loose stools.         Marland Kitchen losartan (COZAAR) 50 MG tablet   Oral   Take 1 tablet (50 mg total) by mouth daily.   30 tablet   3   . Melatonin 5 MG TABS   Oral   Take 5 mg by mouth daily.         . nitroGLYCERIN (NITROSTAT) 0.4 MG SL tablet   Sublingual   Place 1 tablet (0.4 mg total) under the tongue every 5 (five) minutes as needed for chest pain. Reported on 02/20/2016   25 tablet   3   . ondansetron (ZOFRAN-ODT) 4 MG disintegrating tablet   Oral   Take 1 tablet (4 mg total) by mouth every 8 (eight) hours as needed for nausea or vomiting.   20 tablet   0   . pantoprazole (PROTONIX) 40 MG tablet   Oral   Take 1 tablet (40 mg total) by mouth daily at 6 (six) AM.   90 tablet   1   . predniSONE (DELTASONE) 50 MG tablet   Oral   Take 1 tablet (50 mg total) by mouth daily with breakfast.  5 tablet   0   . ticagrelor (BRILINTA) 90 MG TABS tablet   Oral    Take 1 tablet (90 mg total) by mouth 2 (two) times daily.   60 tablet   2   . venlafaxine XR (EFFEXOR-XR) 37.5 MG 24 hr capsule   Oral   Take 37.5 mg by mouth daily with breakfast.           Allergies Lisinopril and Shellfish allergy  Family History  Problem Relation Age of Onset  . Diabetes Father   . Cancer Maternal Grandmother     lung  . Cancer Maternal Grandfather     prostate  . Diabetes Paternal Grandfather   . Cancer - Cervical Maternal Aunt     Social History Social History  Substance Use Topics  . Smoking status: Former Smoker -- 15 years    Quit date: 03/10/2015  . Smokeless tobacco: Never Used  . Alcohol Use: No     Review of Systems  Constitutional: No fever/chills Eyes: No visual changes. No discharge ENT: No upper respiratory complaints. Cardiovascular: no chest pain. Respiratory: no cough. No SOB. Gastrointestinal: No abdominal pain.  Positive for post tussive emesis  Positive for chronic diarrhea..  No constipation. Genitourinary: Negative for dysuria. No hematuria Musculoskeletal: Negative for musculoskeletal pain Skin: Negative for rash, abrasions, lacerations, ecchymosis. Neurological: Negative for headaches, focal weakness or numbness. 10-point ROS otherwise negative.  ____________________________________________   PHYSICAL EXAM:  VITAL SIGNS: ED Triage Vitals  Enc Vitals Group     BP 06/06/16 1852 136/80 mmHg     Pulse Rate 06/06/16 1852 99     Resp 06/06/16 1852 16     Temp 06/06/16 1852 98.7 F (37.1 C)     Temp Source 06/06/16 1852 Oral     SpO2 06/06/16 1852 99 %     Weight 06/06/16 1852 204 lb (92.534 kg)     Height 06/06/16 1852 '5\' 5"'$  (1.651 m)     Head Cir --      Peak Flow --      Pain Score 06/06/16 1853 8     Pain Loc --      Pain Edu? --      Excl. in [place]? --      Constitutional: Alert and oriented. Well appearing and in no acute distress. Eyes: Conjunctivae are normal. PERRL. EOMI. Head: Atraumatic. ENT:       Ears: He sees and TMs unremarkable bilaterally      Nose: No congestion/rhinnorhea.      Mouth/Throat: Mucous membranes are moist.  Neck: No stridor.  Hematological/Lymphatic/Immunilogical: No cervical lymphadenopathy Cardiovascular: Normal rate, regular rhythm. Normal S1 and S2.  Good peripheral circulation. Respiratory: Normal respiratory effort without tachypnea or retractions. Lungs CTAB. Good air entry to the bases with no decreased or absent breath sounds. Gastrointestinal: Bowel sounds 4 quadrants. Soft and nontender to palpation. No guarding or rigidity. No palpable masses. No distention. No CVA tenderness. Musculoskeletal: Full range of motion to all extremities. No gross deformities appreciated. Patient is diffusely tender to palpation over lateral rib cage. No palpable abnormality. Tenderness occurs in the intercostal spacing. Neurologic:  Normal speech and language. No gross focal neurologic deficits are appreciated.  Skin:  Skin is warm, dry and intact. No rash noted. Psychiatric: Mood and affect are normal. Speech and behavior are normal. Patient exhibits appropriate insight and judgement.   ____________________________________________   LABS (all labs ordered are listed, but only abnormal results are displayed)  Labs Reviewed  BASIC METABOLIC PANEL - Abnormal; Notable for the following:    Glucose, Bld 130 (*)    BUN 27 (*)    Creatinine, Ser 1.35 (*)    Calcium 8.7 (*)    GFR calc non Af Amer 50 (*)    GFR calc Af Amer 58 (*)    All other components within normal limits  CBC - Abnormal; Notable for the following:    Hemoglobin 10.9 (*)    HCT 32.6 (*)    RDW 14.9 (*)    All other components within normal limits  TROPONIN I   ____________________________________________  EKG  EKG reveals normal sinus rhythm at a rate of 97 bpm. No ST elevation or depression noted. Inverted P waves in V1 is consistent with left atrial enlargement. PR, QRS, QT intervals within  normal limits. No Q waves or delta waves identified.  ____________________________________________  RADIOLOGY Diamantina [facility] Mackinley Kiehn, personally viewed and evaluated these images (plain radiographs) as part of my medical decision making, as well as reviewing the written report by the radiologist.  Dg Chest 2 View  06/06/2016  CLINICAL DATA:  Productive cough, back pain EXAM: CHEST  2 VIEW COMPARISON:  04/19/2016 FINDINGS: The heart size and mediastinal contours are within normal limits. Both lungs are clear. The visualized skeletal structures are unremarkable. IMPRESSION: No active cardiopulmonary disease. Electronically Signed   By: Kathreen Devoid   On: 06/06/2016 19:15    ____________________________________________    PROCEDURES  Procedure(s) performed:       Medications - No data to display   ____________________________________________   INITIAL IMPRESSION / ASSESSMENT AND PLAN / ED COURSE  Pertinent labs & imaging results that were available during my care of the patient were reviewed by me and considered in my medical decision making (see chart for details).  Patient's diagnosis is consistent with viral respiratory infection causing posttussive emesis. Patient has an ongoing complaint of IBS that has been exacerbated by coughing. Patient is being followed by primary care has a pending referral to GI for her chronic vomiting and diarrhea. With the exception of posttussive emesis there is been no change in bowel complaint. Exam is reassuring. Patient's x-ray, EKG, labs, exam is reassuring. Patient labs are consistent with previous results.. Patient will be discharged home with prescriptions for cough medication, steroids, antiemetics. Patient is to follow up with primary care and GI as needed or otherwise directed. Patient is given ED precautions to return to the ED for any worsening or new symptoms.     ____________________________________________  FINAL CLINICAL  IMPRESSION(S) / ED DIAGNOSES  Final diagnoses:  Viral respiratory infection  Post-tussive emesis  Irritable bowel syndrome with diarrhea      NEW MEDICATIONS STARTED DURING THIS VISIT:  Discharge Medication List as of 06/06/2016  9:06 PM    START taking these medications   Details  brompheniramine-pseudoephedrine-DM 30-2-10 MG/5ML syrup Take 10 mLs by mouth 4 (four) times daily as needed., Starting 06/06/2016, Until Discontinued, Print    ondansetron (ZOFRAN-ODT) 4 MG disintegrating tablet Take 1 tablet (4 mg total) by mouth every 8 (eight) hours as needed for nausea or vomiting., Starting 06/06/2016, Until Discontinued, Print    predniSONE (DELTASONE) 50 MG tablet Take 1 tablet (50 mg total) by mouth daily with breakfast., Starting 06/06/2016, Until Discontinued, Print            This chart was dictated using voice recognition software/Dragon. Despite best efforts to proofread, errors can occur which can change the meaning. Any  change was purely unintentional.    Darletta Moll, PA-C 06/06/16 2135  Harvest Dark, MD 06/07/16 [phone]

## 2016-06-07 ENCOUNTER — Telehealth: Payer: Self-pay | Admitting: Gastroenterology

## 2016-06-07 ENCOUNTER — Encounter: Payer: Self-pay | Admitting: Gastroenterology

## 2016-06-07 NOTE — Telephone Encounter (Signed)
Pt has appt for Sept 6, 2017, states she was told she needs to be seen urgently.  Please call pt to advise.

## 2016-06-07 NOTE — Telephone Encounter (Signed)
Called and spoke to Dominica at [employer]. I explained to her what was going on. She stated that she was sending a message back to the nurse of the doctor the patient is scheduled with to see if the patient can be seen earlier. I asked for her to please call me and let me know if the patient can be seen earlier. I gave her my direct number and said that they could leave a voicemail if I was not able to answer the phone.

## 2016-06-07 NOTE — Telephone Encounter (Signed)
I have called [name] [name] from [facility] family practice and let her know of the appointment change.

## 2016-06-07 NOTE — Telephone Encounter (Signed)
Called and spoke to patient. I let her know that I was going to call over to [facility] to see if they had any earlier appointments.

## 2016-06-07 NOTE — Telephone Encounter (Signed)
Called and let patient know about new appointment date and time with [facility].

## 2016-06-07 NOTE — Telephone Encounter (Signed)
[facility] called me back and stated they were able to get the patient into see their PA Alonza Bogus on July 6th at 2:00. I told them that I would call the patient and let them know.

## 2016-06-07 NOTE — Telephone Encounter (Signed)
Scheduled patient with [name] Bogus, PA on 06/13/16 at 2:00 PM.  Please, call them with this appointment.This is the next available OV. If not soon enough, needs to go to [facility] Dr. Henrene Pastor.

## 2016-06-10 ENCOUNTER — Encounter: Payer: Self-pay | Admitting: *Deleted

## 2016-06-13 ENCOUNTER — Ambulatory Visit: Payer: Medicaid Other | Admitting: Gastroenterology

## 2016-06-13 ENCOUNTER — Telehealth: Payer: Self-pay | Admitting: Gastroenterology

## 2016-06-13 ENCOUNTER — Telehealth: Payer: Self-pay | Admitting: Unknown Physician Specialty

## 2016-06-13 NOTE — Telephone Encounter (Signed)
Dr. Carlean Purl please review and advise.  No APP or MD appts.  Patient was an urgent referral, but missed appt for today.  Ok to wait for APP the week of 7/17?

## 2016-06-13 NOTE — Telephone Encounter (Signed)
A user error has taken place: ERROR °

## 2016-06-13 NOTE — Telephone Encounter (Signed)
Send to [facility] for evaluation of urgency.

## 2016-06-13 NOTE — Telephone Encounter (Signed)
Sent to [facility]-Dr.Gessner please advise as to urgency of scheduling

## 2016-06-13 NOTE — Telephone Encounter (Signed)
Pt went to wrong location for GI appt (went to [facility] was supposed to go to [facility]).  They will not schedule her for another emergent appt and told her she would have to wait until Sept 6.  Please call pt to advise.

## 2016-06-14 ENCOUNTER — Other Ambulatory Visit: Payer: Self-pay | Admitting: Unknown Physician Specialty

## 2016-06-14 DIAGNOSIS — E1165 Type 2 diabetes mellitus with hyperglycemia: Secondary | ICD-10-CM

## 2016-06-14 DIAGNOSIS — R945 Abnormal results of liver function studies: Secondary | ICD-10-CM

## 2016-06-14 DIAGNOSIS — E1169 Type 2 diabetes mellitus with other specified complication: Secondary | ICD-10-CM

## 2016-06-14 DIAGNOSIS — IMO0002 Reserved for concepts with insufficient information to code with codable children: Secondary | ICD-10-CM

## 2016-06-14 DIAGNOSIS — K589 Irritable bowel syndrome without diarrhea: Secondary | ICD-10-CM

## 2016-06-14 DIAGNOSIS — R7989 Other specified abnormal findings of blood chemistry: Secondary | ICD-10-CM

## 2016-06-14 NOTE — Telephone Encounter (Signed)
[name] [name], what happens with this now?

## 2016-06-14 NOTE — Telephone Encounter (Signed)
Spoke with patient. Put in a new referral high priority for Gastro. Sending to [facility].

## 2016-06-17 ENCOUNTER — Other Ambulatory Visit: Payer: Self-pay | Admitting: Gastroenterology

## 2016-06-17 DIAGNOSIS — R945 Abnormal results of liver function studies: Secondary | ICD-10-CM

## 2016-06-17 DIAGNOSIS — R7989 Other specified abnormal findings of blood chemistry: Secondary | ICD-10-CM

## 2016-06-17 NOTE — Telephone Encounter (Signed)
It should be lets have her repeat lft's this week also

## 2016-06-17 NOTE — Telephone Encounter (Signed)
No answer/machine.  I will try and call her later

## 2016-06-18 ENCOUNTER — Telehealth: Payer: Self-pay | Admitting: Unknown Physician Specialty

## 2016-06-18 ENCOUNTER — Telehealth: Payer: Self-pay | Admitting: Cardiovascular Disease

## 2016-06-18 NOTE — Telephone Encounter (Signed)
Pt called and stated she has been really dizzy and light headed for 2-3 days along with her everyday stomach nausea. She has also been having muscle spasms and she would like to know what she should do.

## 2016-06-18 NOTE — Telephone Encounter (Signed)
She needs to be seen.

## 2016-06-18 NOTE — Telephone Encounter (Signed)
Received cardiac clearance request from [facility] for 8/23 EGD and colonoscopy. Pt has appt w/Dr. Fletcher Anon 7/20 Clearance on Ivin Booty, RN desk.

## 2016-06-18 NOTE — Telephone Encounter (Signed)
See notes in the chart.  Patient has been seen by [facility].

## 2016-06-18 NOTE — Telephone Encounter (Signed)
She needs to go to GI ASAP.  When is her appointment?

## 2016-06-19 NOTE — Telephone Encounter (Signed)
Called and left patient a voicemail asking for her to please return my call.  

## 2016-06-19 NOTE — Telephone Encounter (Signed)
Patient returned my call. She states that she has already seen GI, this past Monday. She states she is having an ultrasound of her liver and gallbladder tomorrow and a procedure in August. I went ahead and scheduled the patient an appointment with [place] on Friday.

## 2016-06-20 ENCOUNTER — Ambulatory Visit
Admission: RE | Admit: 2016-06-20 | Discharge: 2016-06-20 | Disposition: A | Discharge status: Home / Self Care | Payer: Medicaid Other | Source: Ambulatory Visit | Attending: Gastroenterology | Admitting: Gastroenterology

## 2016-06-20 DIAGNOSIS — R932 Abnormal findings on diagnostic imaging of liver and biliary tract: Secondary | ICD-10-CM | POA: Insufficient documentation

## 2016-06-20 DIAGNOSIS — R7989 Other specified abnormal findings of blood chemistry: Secondary | ICD-10-CM | POA: Insufficient documentation

## 2016-06-20 DIAGNOSIS — I700 Atherosclerosis of aorta: Secondary | ICD-10-CM | POA: Diagnosis not present

## 2016-06-20 DIAGNOSIS — R945 Abnormal results of liver function studies: Secondary | ICD-10-CM

## 2016-06-21 ENCOUNTER — Ambulatory Visit (INDEPENDENT_AMBULATORY_CARE_PROVIDER_SITE_OTHER): Payer: Medicaid Other | Admitting: Unknown Physician Specialty

## 2016-06-21 ENCOUNTER — Encounter: Payer: Self-pay | Admitting: Unknown Physician Specialty

## 2016-06-21 VITALS — BP 112/79 | HR 103 | Temp 98.3°F | Ht 66.7 in | Wt 204.6 lb

## 2016-06-21 DIAGNOSIS — I951 Orthostatic hypotension: Secondary | ICD-10-CM | POA: Diagnosis not present

## 2016-06-21 DIAGNOSIS — R42 Dizziness and giddiness: Secondary | ICD-10-CM

## 2016-06-21 DIAGNOSIS — G43A1 Cyclical vomiting, intractable: Secondary | ICD-10-CM

## 2016-06-21 DIAGNOSIS — R197 Diarrhea, unspecified: Secondary | ICD-10-CM

## 2016-06-21 DIAGNOSIS — R1115 Cyclical vomiting syndrome unrelated to migraine: Secondary | ICD-10-CM

## 2016-06-21 MED ORDER — MECLIZINE HCL 32 MG PO TABS: 32.0000 mg | ORAL_TABLET | Freq: Three times a day (TID) | ORAL | Status: DC | PRN

## 2016-06-21 NOTE — Assessment & Plan Note (Addendum)
rx for Meclizine.  Refer to ENT

## 2016-06-21 NOTE — Progress Notes (Signed)
++++++++++  BP 112/79 mmHg  Pulse 103  Temp(Src) 98.3 F (36.8 C)  Ht 5' 6.7" (1.694 m)  Wt 204 lb 9.6 oz (92.806 kg)  BMI 32.34 kg/m2  SpO2 98%  LMP 03/09/2016 (Exact Date)   Subjective:    Patient ID: [name] [name], female    DOB: [birth year], 35 y.o.   MRN: [id number]  HPI: Valarie Bolivar is a 35 y.o. female  Chief Complaint  Patient presents with  . Dizziness    pt states she keeps getting dizzy and lightheaded, states it started at the beginning of the week  . Nausea    pt states she is still experiencing nausea, vomitting, and diarrhea. states she saw GI Monday and had a [place] yesterday   Nausea Reviewed note.  W/U through GI.  Liver US shows fatty liver.    Dizzy Pt states that if she does anything, gets dizzy and light headed.  Describes it as the room is spinning and feels like she is going to pass out.  States BS is running 80 to low 100's.  182 this am because ate crackers due to high blood sugar.  She does have a bilateral trontal headache across temples.    Relevant past medical, surgical, family and social history reviewed and updated as indicated. Interim medical history since our last visit reviewed. Allergies and medications reviewed and updated.  Review of Systems  Per HPI unless specifically indicated above     Objective:    BP 112/79 mmHg  Pulse 103  Temp(Src) 98.3 F (36.8 C)  Ht 5' 6.7" (1.694 m)  Wt 204 lb 9.6 oz (92.806 kg)  BMI 32.34 kg/m2  SpO2 98%  LMP 03/09/2016 (Exact Date)  Wt Readings from Last 3 Encounters:  06/21/16 204 lb 9.6 oz (92.806 kg)  06/06/16 204 lb (92.534 kg)  06/03/16 207 lb 9.6 oz (94.167 kg)    Physical Exam  Constitutional: She is oriented to person, place, and time. She appears well-developed and well-nourished. No distress. + HENT:  Head: Normocephalic and atraumatic.  Nystagmus with left lateral gaze  Eyes: Conjunctivae and lids are normal. Right eye exhibits no discharge. Left eye exhibits no discharge. No  scleral icterus.  Neck: Normal range of motion. Neck supple. No JVD present. Carotid bruit is not present.  Cardiovascular: Normal rate, regular rhythm and normal heart sounds.   Pulmonary/Chest: Effort normal and breath sounds normal.  Abdominal: Normal appearance. There is no splenomegaly or hepatomegaly.  Musculoskeletal: Normal range of motion.  Neurological: She is alert and oriented to person, place, and time.  Skin: Skin is warm, dry and intact. No rash noted. No pallor.  Psychiatric: She has a normal mood and affect. Her behavior is normal. Judgment and thought content normal.   Orthostatic changes of about 10 units with standing  Results for orders placed or performed during the hospital encounter of [id number]  Basic metabolic panel  Result Value Ref Range   Sodium 136 135 - 145 mmol/L   Potassium 4.3 3.5 - 5.1 mmol/L   Chloride 108 101 - 111 mmol/L   CO2 23 22 - 32 mmol/L   Glucose, Bld 130 (H) 65 - 99 mg/dL   BUN 27 (H) 6 - 20 mg/dL   Creatinine, Ser 1.35 (H) 0.44 - 1.00 mg/dL   Calcium 8.7 (L) 8.9 - 10.3 mg/dL   GFR calc non Af Amer 50 (L) >60 mL/min   GFR calc Af Amer 58 (L) >60 mL/min   Anion gap 5  5 - 15  CBC  Result Value Ref Range   WBC 6.6 3.6 - 11.0 K/uL   RBC 3.93 3.80 - 5.20 MIL/uL   Hemoglobin 10.9 (L) 12.0 - 16.0 g/dL   HCT 32.6 (L) 35.0 - 47.0 %   MCV 83.0 80.0 - 100.0 fL   MCH 27.6 26.0 - 34.0 pg   MCHC 33.3 32.0 - 36.0 g/dL   RDW 14.9 (H) 11.5 - 14.5 %   Platelets 343 150 - 440 K/uL  Troponin I  Result Value Ref Range   Troponin I <0.03 <0.03 ng/mL      Assessment & Plan:   Problem List Items Addressed This Visit      Unprioritized   Diarrhea   Orthostatic hypotension - Primary    Am concerned about dehydration with a lot of vomiting and diarrhea.  Stop Losartan for now.  Not taking Furosemide      Vertigo    rx for Meclizine.  Refer to ENT      Vomiting      F/u of diarrhea and vomiting through GI.  Stop Trulicity as symptoms worse  in the last 4 weeks since starting.   Follow up plan: Return if symptoms worsen or fail to improve.

## 2016-06-21 NOTE — Assessment & Plan Note (Signed)
Am concerned about dehydration with a lot of vomiting and diarrhea.  Stop Losartan for now.  Not taking Furosemide

## 2016-06-24 ENCOUNTER — Telehealth: Payer: Self-pay | Admitting: Cardiovascular Disease

## 2016-06-24 NOTE — Telephone Encounter (Signed)
Faxed clearance to [facility], [phone] for 8/23 colonoscopy/EGD Pt to hold brilinta 5 days prior to procedure.

## 2016-06-27 ENCOUNTER — Other Ambulatory Visit
Admission: RE | Admit: 2016-06-27 | Discharge: 2016-06-27 | Disposition: A | Discharge status: Home / Self Care | Payer: Medicaid Other | Source: Ambulatory Visit | Attending: Cardiovascular Disease | Admitting: Cardiovascular Disease

## 2016-06-27 ENCOUNTER — Encounter: Payer: Self-pay | Admitting: Cardiovascular Disease

## 2016-06-27 ENCOUNTER — Ambulatory Visit (INDEPENDENT_AMBULATORY_CARE_PROVIDER_SITE_OTHER): Payer: Medicaid Other | Admitting: Cardiovascular Disease

## 2016-06-27 VITALS — BP 90/66 | HR 108 | Ht 65.0 in | Wt 198.8 lb

## 2016-06-27 DIAGNOSIS — R0602 Shortness of breath: Secondary | ICD-10-CM

## 2016-06-27 DIAGNOSIS — R000 Tachycardia, unspecified: Secondary | ICD-10-CM | POA: Diagnosis not present

## 2016-06-27 DIAGNOSIS — I25118 Atherosclerotic heart disease of native coronary artery with other forms of angina pectoris: Secondary | ICD-10-CM

## 2016-06-27 DIAGNOSIS — I5032 Chronic diastolic (congestive) heart failure: Secondary | ICD-10-CM

## 2016-06-27 LAB — CBC
HCT: 33.5 % — ABNORMAL LOW (ref 35.0–47.0)
Hemoglobin: 11.3 g/dL — ABNORMAL LOW (ref 12.0–16.0)
MCH: 27.8 pg (ref 26.0–34.0)
MCHC: 33.8 g/dL (ref 32.0–36.0)
MCV: 82.2 fL (ref 80.0–100.0)
Platelets: 286 10*3/uL (ref 150–440)
RBC: 4.08 MIL/uL (ref 3.80–5.20)
RDW: 14.2 % (ref 11.5–14.5)
WBC: 7.1 10*3/uL (ref 3.6–11.0)

## 2016-06-27 LAB — BASIC METABOLIC PANEL
Anion gap: 7 (ref 5–15)
BUN: 26 mg/dL — ABNORMAL HIGH (ref 6–20)
CO2: 23 mmol/L (ref 22–32)
Calcium: 8.5 mg/dL — ABNORMAL LOW (ref 8.9–10.3)
Chloride: 107 mmol/L (ref 101–111)
Creatinine, Ser: 1.67 mg/dL — ABNORMAL HIGH (ref 0.44–1.00)
GFR calc Af Amer: 45 mL/min — ABNORMAL LOW (ref >60)
GFR calc non Af Amer: 39 mL/min — ABNORMAL LOW (ref >60)
Glucose, Bld: 162 mg/dL — ABNORMAL HIGH (ref 65–99)
Potassium: 4.4 mmol/L (ref 3.5–5.1)
Sodium: 137 mmol/L (ref 135–145)

## 2016-06-27 LAB — TSH: TSH: 2.824 u[IU]/mL (ref 0.350–4.500)

## 2016-06-27 MED ORDER — METOPROLOL TARTRATE 25 MG PO TABS: 25.0000 mg | ORAL_TABLET | Freq: Two times a day (BID) | ORAL | Status: DC

## 2016-06-27 NOTE — Progress Notes (Signed)
Cardiology Office Note   Date:  06/27/2016   ID:  [name], [name] 02-01-1981, MRN [id number]  PCP:  Kathrine Haddock, NP  Cardiologist:   Kathlyn Sacramento, MD   Chief Complaint  Patient presents with  . Follow-up    SOB with activity      History of Present Illness: [name] [name] is a 35 y.o. female who presents for a follow-up visit regarding coronary artery disease and chronic systolic heart failure.  She has prolonged history of type 2 diabetes since 2002 , prolonged history of tobacco use, and obesity.  She presented to [facility] in April of 2016 with NSTEMI. Echocardiogram showed an ejection fraction of 30% with severe inferior, inferolateral and lateral wall hypokinesis with moderate mitral regurgitation. Cardiac catheterization showed occluded mid left circumflex with significant disease affecting the proximal OM1. There was mild disease affecting the LAD and minor irregularities in the RCA. Ejection fraction was 35%. I performed successful angioplasty and drug-eluting stent placement to the mid LCX and proximal OM . She underwent a repeat cardiac catheterization in May 2016 which showed patent stents in the left circumflex and OM1. Ejection fraction was 50-55%.  She was hospitalized at [facility] in March, 2017 for chest pain thought to be due to panic attacks. She ruled out for myocardial infarction. She underwent a nuclear stress test showed evidence of prior left circumflex infarct without ischemia with normal ejection fraction. She has been dealing with a lot of GI symptoms including abdominal pain, nausea, vomiting and occasional diarrhea. She reports inability to eat due to postprandial abdominal discomfort. She was seen by GI and she is scheduled for EGD and colonoscopy. Blood pressure has been running low. Losartan was discontinued last week by her Kathrine Haddock. She is not taking furosemide. She is mildly tachycardic today and hypotensive. She has chronic intermittent chest pain with  no recent worsening. Dyspnea is stable.    Past Medical History  Diagnosis Date  . CAD (coronary artery disease)     a. NSTEMI 03/2015: mLCx 100% s/p PCI/DES, RI 90% s/p PCI/DES, EF 35%; b. 04/2015 Relook Cath: LM nl, LAD 40p, RI patent stent, LCX patent stent, RCA nl, EF 55-65%.  . Chronic systolic CHF (congestive heart failure) ([place])     a. echo 03/2015: EF 30-35%, mild concentric LVH, severe HK of inf, inflat, & lat walls, mod MR, mild TR;  b. 04/2015 LV Gram: EF 55-65%.  . Ischemic cardiomyopathy     a. 03/2015 EF 30-35% post NSTEMI;  b. 04/2015 EF 55-65% on LV gram.  . DM2 (diabetes mellitus, type 2) ([place])     a. since 2002  . Tobacco abuse     a. quit 03/2015.  . Obesity   . Hyperlipidemia   . Hypotension     a. resulting in discontinuation of losartan and reduction in coreg dose.  Marland Kitchen Heavy menses     a. H/O IUD - expired in 2014 - remains in place.  . Iron deficiency anemia   . Kidney disease   . Uterine fibroid   . Hypertension   . MI (myocardial infarction) ([place]) 03/29/2015  . Renal insufficiency     Past Surgical History  Procedure Laterality Date  . Mouth surgery    . Cardiac catheterization  4/16    x2 stent [facility]  . Cardiac catheterization N/A 05/05/2015    Procedure: Left Heart Cath and Coronary Angiography;  Surgeon: Peter M Martinique, MD;  Location: [place] [facility];  Service: Cardiovascular;  Laterality: N/A;     Current Outpatient Prescriptions  Medication Sig Dispense Refill  . aspirin EC 81 MG tablet Take 81 mg by mouth daily.    . blood glucose meter kit and supplies KIT Dispense based on patient and insurance preference. Use up to four times daily as directed. E11.22. 1 each 0  . etonogestrel (NEXPLANON) 68 MG IMPL implant 1 each by Subdermal route once.    . ferrous sulfate 325 (65 FE) MG tablet Take 1 tablet (325 mg total) by mouth 3 (three) times daily with meals. (Patient taking differently: Take 325 mg by mouth daily with breakfast. ) 90 tablet 3  .  glimepiride (AMARYL) 4 MG tablet Take 1 tablet (4 mg total) by mouth daily. 90 tablet 1  . glucose blood (ACCU-CHEK ACTIVE STRIPS) test strip Use as instructed 100 each 12  . hyoscyamine (LEVSIN SL) 0.125 MG SL tablet Place under the tongue every 6 (six) hours as needed.    . Insulin Syringe-Needle U-100 (INSULIN SYRINGE 1CC/31GX5/16") 31G X 5/16" 1 ML MISC 1 Units by Does not apply route at bedtime. 90 each 3  . loperamide (IMODIUM) 2 MG capsule Take 2 mg by mouth as needed for diarrhea or loose stools.    . meclizine (ANTIVERT) 32 MG tablet Take 1 tablet (32 mg total) by mouth 3 (three) times daily as needed. 30 tablet 0  . Melatonin 5 MG TABS Take 5 mg by mouth daily.    . nitroGLYCERIN (NITROSTAT) 0.4 MG SL tablet Place 1 tablet (0.4 mg total) under the tongue every 5 (five) minutes as needed for chest pain. Reported on 02/20/2016 25 tablet 3  . pantoprazole (PROTONIX) 40 MG tablet Take 1 tablet (40 mg total) by mouth daily at 6 (six) AM. 90 tablet 1  . ticagrelor (BRILINTA) 90 MG TABS tablet Take 1 tablet (90 mg total) by mouth 2 (two) times daily. 60 tablet 2  . venlafaxine XR (EFFEXOR-XR) 37.5 MG 24 hr capsule Take 37.5 mg by mouth daily with breakfast.    . metoprolol tartrate (LOPRESSOR) 25 MG tablet Take 1 tablet (25 mg total) by mouth 2 (two) times daily. 60 tablet 3   No current facility-administered medications for this visit.    Allergies:   Lisinopril and Shellfish allergy    Social History:  The patient  reports that she quit smoking about 15 months ago. She has never used smokeless tobacco. She reports that she does not drink alcohol or use illicit drugs.   Family History:  The patient's family history includes Cancer in her maternal grandfather and maternal grandmother; Cancer - Cervical in her maternal aunt; Diabetes in her father and paternal grandfather.    ROS:  Please see the history of present illness.   Otherwise, review of systems are positive for none.   All other  systems are reviewed and negative.    PHYSICAL EXAM: VS:  BP 90/66 mmHg  Pulse 108  Ht 5\' 5"  (1.651 m)  Wt 198 lb 12.8 oz (90.175 kg)  BMI 33.08 kg/m2  SpO2 98%  LMP 03/09/2016 (Exact Date) , BMI Body mass index is 33.08 kg/(m^2). GEN: Well nourished, well developed, in no acute distress HEENT: normal Neck: no JVD, carotid bruits, or masses Cardiac:Mildly tachycardic; no murmurs, rubs, or gallops,no edema  Respiratory:  clear to auscultation bilaterally, normal work of breathing GI: soft, nontender, nondistended, + BS MS: no deformity or atrophy Skin: warm and dry, no rash Neuro:  Strength and sensation are intact  Psych: euthymic mood, full affect   EKG:  EKG is not ordered today.    Recent Labs: 02/13/2016: B Natriuretic Peptide 127.0* 03/01/2016: TSH 4.154 06/03/2016: ALT 102* 06/06/2016: BUN 27*; Creatinine, Ser 1.35*; Hemoglobin 10.9*; Platelets 343; Potassium 4.3; Sodium 136    Lipid Panel    Component Value Date/Time   CHOL 154 03/06/2016 0900   CHOL 106 08/30/2015 1439   CHOL 133 03/31/2015 0225   TRIG 111 03/06/2016 0900   TRIG 77 03/31/2015 0225   HDL 39* 03/06/2016 0900   HDL 26* 03/31/2015 0225   CHOLHDL 2.9 05/16/2015 1101   VLDL 15 03/31/2015 0225   LDLCALC 93 03/06/2016 0900   LDLCALC 92 03/31/2015 0225      Wt Readings from Last 3 Encounters:  06/27/16 198 lb 12.8 oz (90.175 kg)  06/21/16 204 lb 9.6 oz (92.806 kg)  06/06/16 204 lb (92.534 kg)        ASSESSMENT AND PLAN:  1.  Coronary artery disease involving native coronary arteries with other forms of angina: She appears to be overall stable from a cardiac standpoint. Continue medical therapy. She is at low risk for endoscopic GI procedures from a cardiac standpoint. We instructed her to hold Brilinta  5 days before procedures .  2. Chronic systolic heart failure: Ejection fraction improved to normal.  Avoid using diuretics in the setting of volume depletion. She was mildly hypotensive and  tachycardic and mildly volume depleted due to GI loss. I agree with stopping losartan for now. I requested routine labs today including CBC, basic metabolic profile and TSH. I'm also switching carvedilol to metoprolol 25 mg twice daily.   3. Essential hypertension: Blood pressure is low possibly due to volume depletion. I requested labs.  4. Hyperlipidemia:  she is dealing with abnormal liver function tests of unclear etiology. She also reports generalized myalgia. Due to all of that, I decided to discontinue atorvastatin for the time being. I will consider a different statin in the near future once her symptoms are improved.     Disposition:   FU with me in 1 months  Signed,  Kathlyn Sacramento, MD  06/27/2016 8:40 AM    [place]

## 2016-06-27 NOTE — Patient Instructions (Addendum)
Medication Instructions:  Your physician has recommended you make the following change in your medication:  STOP taking atorvastatin STOP taking coreg STOP taking lasix STOP taking losartan START taking metoprolol 25mg  twice daily    Labwork: BMET, CBC, TSH at [facility] today  Testing/Procedures: none  Follow-Up: Your physician recommends that you schedule a follow-up appointment in: 1 month with Dr. Fletcher Anon.    Any Other Special Instructions Will Be Listed Below (If Applicable).     If you need a refill on your cardiac medications before your next appointment, please call your pharmacy.

## 2016-07-01 ENCOUNTER — Ambulatory Visit (INDEPENDENT_AMBULATORY_CARE_PROVIDER_SITE_OTHER): Payer: Medicaid Other | Admitting: Unknown Physician Specialty

## 2016-07-01 ENCOUNTER — Encounter: Payer: Self-pay | Admitting: Unknown Physician Specialty

## 2016-07-01 DIAGNOSIS — Z794 Long term (current) use of insulin: Secondary | ICD-10-CM | POA: Diagnosis not present

## 2016-07-01 DIAGNOSIS — N181 Chronic kidney disease, stage 1: Secondary | ICD-10-CM | POA: Diagnosis not present

## 2016-07-01 DIAGNOSIS — E1122 Type 2 diabetes mellitus with diabetic chronic kidney disease: Secondary | ICD-10-CM

## 2016-07-01 MED ORDER — SITAGLIPTIN PHOSPHATE 100 MG PO TABS: 100.0000 mg | ORAL_TABLET | Freq: Every day | ORAL | 2 refills | Status: DC

## 2016-07-01 NOTE — Assessment & Plan Note (Addendum)
Will not start Trulicity due to elevation of liver enzymes after starting it previously.  ? If that was the cause.  They are now coming back down after reviewing notes from [place].  Her sugars at home seem to be good.  She will continue to work on diet and exercise.  Will start Januvia and recheck in 2 months

## 2016-07-01 NOTE — Progress Notes (Signed)
BP 125/86 (BP Location: Left Arm, Patient Position: Sitting, Cuff Size: Large)   Pulse 93   Temp 98.3 F (36.8 C)   Ht 5' 6.6" (1.692 m)   Wt 202 lb 6.4 oz (91.8 kg)   LMP 06/28/2016 (Exact Date)   SpO2 99%   BMI 32.08 kg/m    Subjective:    Patient ID: [name] [name], female    DOB: [birth year], 35 y.o.   MRN: [id number]  HPI: [name] [name] is a 35 y.o. female  Chief Complaint  Patient presents with  . Diabetes    4 week f/up   Pt has been to see both cardiology and GI since last seen.  I reviewed her notes from cardilogy and from GI.  Reviewe of US shows fatty liver and aortic atherosclerosis  Diabetes We started Trulicity last visit.  We stopped this due to episodic nausea and vomiting, however no chang in symptoms.  States blood sugars are 100 in the AM.  Last Hgb A1C was 7.4.  She is watching what she eats.  She is only taking Glimepiride 4 mg.  She would like to retry the Trulicity.     Relevant past medical, surgical, family and social history reviewed and updated as indicated. Interim medical history since our last visit reviewed. Allergies and medications reviewed and updated.  Review of Systems  Per HPI unless specifically indicated above     Objective:    BP 125/86 (BP Location: Left Arm, Patient Position: Sitting, Cuff Size: Large)   Pulse 93   Temp 98.3 F (36.8 C)   Ht 5' 6.6" (1.692 m)   Wt 202 lb 6.4 oz (91.8 kg)   LMP 06/28/2016 (Exact Date)   SpO2 99%   BMI 32.08 kg/m   Wt Readings from Last 3 Encounters:  07/01/16 202 lb 6.4 oz (91.8 kg)  06/27/16 198 lb 12.8 oz (90.2 kg)  06/21/16 204 lb 9.6 oz (92.8 kg)    Physical Exam  Constitutional: She is oriented to person, place, and time. She appears well-developed and well-nourished. No distress.  HENT:  Head: Normocephalic and atraumatic.  Eyes: Conjunctivae and lids are normal. Right eye exhibits no discharge. Left eye exhibits no discharge. No scleral icterus.  Neck: Normal range of motion.  Neck supple. No JVD present. Carotid bruit is not present.  Cardiovascular: Normal rate, regular rhythm and normal heart sounds.   Pulmonary/Chest: Effort normal and breath sounds normal.  Abdominal: Normal appearance. There is no splenomegaly or hepatomegaly.  Musculoskeletal: Normal range of motion.  Neurological: She is alert and oriented to person, place, and time.  Skin: Skin is warm, dry and intact. No rash noted. No pallor.  Psychiatric: She has a normal mood and affect. Her behavior is normal. Judgment and thought content normal.  Vitals reviewed.   Results for orders placed or performed during the hospital encounter of 06/27/16  CBC  Result Value Ref Range   WBC 7.1 3.6 - 11.0 K/uL   RBC 4.08 3.80 - 5.20 MIL/uL   Hemoglobin 11.3 (L) 12.0 - 16.0 g/dL   HCT 33.5 (L) 35.0 - 47.0 %   MCV 82.2 80.0 - 100.0 fL   MCH 27.8 26.0 - 34.0 pg   MCHC 33.8 32.0 - 36.0 g/dL   RDW 14.2 11.5 - 14.5 %   Platelets 286 150 - 440 K/uL  Basic metabolic panel  Result Value Ref Range   Sodium 137 135 - 145 mmol/L   Potassium 4.4 3.5 - 5.1 mmol/L  Chloride 107 101 - 111 mmol/L   CO2 23 22 - 32 mmol/L   Glucose, Bld 162 (H) 65 - 99 mg/dL   BUN 26 (H) 6 - 20 mg/dL   Creatinine, Ser 1.67 (H) 0.44 - 1.00 mg/dL   Calcium 8.5 (L) 8.9 - 10.3 mg/dL   GFR calc non Af Amer 39 (L) >60 mL/min   GFR calc Af Amer 45 (L) >60 mL/min   Anion gap 7 5 - 15  TSH  Result Value Ref Range   TSH 2.824 0.350 - 4.500 uIU/mL      Assessment & Plan:   Problem List Items Addressed This Visit      Unprioritized   Diabetes type 2, controlled ([place])    Will not start Trulicity due to elevation of liver enzymes after starting it previously.  ? If that was the cause.  They are now coming back down after reviewing notes from [place].  Her sugars at home seem to be good.  She will continue to work on diet and exercise.  Will start Januvia and recheck in 2 months      Relevant Medications   sitaGLIPtin (JANUVIA) 100 MG  tablet    Other Visit Diagnoses   None.      Follow up plan: Return in about 2 months (around 09/01/2016).

## 2016-07-03 ENCOUNTER — Telehealth: Payer: Self-pay

## 2016-07-03 NOTE — Telephone Encounter (Signed)
Got a fax from [facility] [employer]) who makes prior approval decisions for [employer], and they state that this patient's Celesta Gentile is not covered. They state that the patient is required to try and fail a metformin containing product unless contraindicated, or an adverse event was experienced, when using a preferred product.

## 2016-07-03 NOTE — Telephone Encounter (Signed)
Let Malachy Mood look over the paperwork that [facility] sent [place] regarding patient's medication. She asked for me to call the patient and find out why she couldn't take metformin and then give the information to our referral coordinator to do an appeal/PA. I tried calling the patient but there was no answer so I left a VM asking for the patient to please return my call.

## 2016-07-03 NOTE — Telephone Encounter (Signed)
She is intolerant to Metformin

## 2016-07-04 NOTE — Telephone Encounter (Signed)
Patient returned my call. She stated that when she took metformin before, she experienced nausea, vomitting, diarrhea and a rash. Will now forward message this to [facility] for appeal/PA. Will also give her the paperwork that was faxed as well.

## 2016-07-04 NOTE — Telephone Encounter (Signed)
New prior authorization entered with new information on [employer]. Pending Review.

## 2016-07-04 NOTE — Telephone Encounter (Signed)
Medication is now approved through [employer].

## 2016-07-09 DIAGNOSIS — K5730 Diverticulosis of large intestine without perforation or abscess without bleeding: Secondary | ICD-10-CM

## 2016-07-09 HISTORY — DX: Diverticulosis of large intestine without perforation or abscess without bleeding: K57.30

## 2016-07-20 ENCOUNTER — Encounter (HOSPITAL_COMMUNITY): Payer: Self-pay | Admitting: Emergency Medicine

## 2016-07-20 ENCOUNTER — Emergency Department (HOSPITAL_COMMUNITY)
Admission: EM | Admit: 2016-07-20 | Discharge: 2016-07-20 | Disposition: A | Discharge status: Home / Self Care | Payer: Medicaid Other | Attending: Emergency Medicine | Admitting: Emergency Medicine

## 2016-07-20 ENCOUNTER — Emergency Department (HOSPITAL_COMMUNITY): Payer: Medicaid Other

## 2016-07-20 DIAGNOSIS — Y929 Unspecified place or not applicable: Secondary | ICD-10-CM | POA: Insufficient documentation

## 2016-07-20 DIAGNOSIS — I252 Old myocardial infarction: Secondary | ICD-10-CM | POA: Diagnosis not present

## 2016-07-20 DIAGNOSIS — Z23 Encounter for immunization: Secondary | ICD-10-CM | POA: Diagnosis not present

## 2016-07-20 DIAGNOSIS — Z7982 Long term (current) use of aspirin: Secondary | ICD-10-CM | POA: Insufficient documentation

## 2016-07-20 DIAGNOSIS — S8011XA Contusion of right lower leg, initial encounter: Secondary | ICD-10-CM | POA: Insufficient documentation

## 2016-07-20 DIAGNOSIS — R0789 Other chest pain: Secondary | ICD-10-CM | POA: Diagnosis not present

## 2016-07-20 DIAGNOSIS — Y939 Activity, unspecified: Secondary | ICD-10-CM | POA: Diagnosis not present

## 2016-07-20 DIAGNOSIS — S60041A Contusion of right ring finger without damage to nail, initial encounter: Secondary | ICD-10-CM | POA: Insufficient documentation

## 2016-07-20 DIAGNOSIS — Z7984 Long term (current) use of oral hypoglycemic drugs: Secondary | ICD-10-CM | POA: Diagnosis not present

## 2016-07-20 DIAGNOSIS — I110 Hypertensive heart disease with heart failure: Secondary | ICD-10-CM | POA: Insufficient documentation

## 2016-07-20 DIAGNOSIS — S8991XA Unspecified injury of right lower leg, initial encounter: Secondary | ICD-10-CM | POA: Diagnosis present

## 2016-07-20 DIAGNOSIS — R079 Chest pain, unspecified: Secondary | ICD-10-CM

## 2016-07-20 DIAGNOSIS — I25111 Atherosclerotic heart disease of native coronary artery with angina pectoris with documented spasm: Secondary | ICD-10-CM | POA: Diagnosis not present

## 2016-07-20 DIAGNOSIS — Y999 Unspecified external cause status: Secondary | ICD-10-CM | POA: Insufficient documentation

## 2016-07-20 DIAGNOSIS — E119 Type 2 diabetes mellitus without complications: Secondary | ICD-10-CM | POA: Diagnosis not present

## 2016-07-20 DIAGNOSIS — I5032 Chronic diastolic (congestive) heart failure: Secondary | ICD-10-CM | POA: Diagnosis not present

## 2016-07-20 DIAGNOSIS — Z87891 Personal history of nicotine dependence: Secondary | ICD-10-CM | POA: Diagnosis not present

## 2016-07-20 DIAGNOSIS — Z794 Long term (current) use of insulin: Secondary | ICD-10-CM | POA: Diagnosis not present

## 2016-07-20 LAB — BASIC METABOLIC PANEL
Anion gap: 9 (ref 5–15)
BUN: 21 mg/dL — ABNORMAL HIGH (ref 6–20)
CO2: 21 mmol/L — ABNORMAL LOW (ref 22–32)
Calcium: 9 mg/dL (ref 8.9–10.3)
Chloride: 106 mmol/L (ref 101–111)
Creatinine, Ser: 1.16 mg/dL — ABNORMAL HIGH (ref 0.44–1.00)
GFR calc Af Amer: >60 mL/min (ref >60)
GFR calc non Af Amer: 60 mL/min — ABNORMAL LOW (ref >60)
Glucose, Bld: 183 mg/dL — ABNORMAL HIGH (ref 65–99)
Potassium: 3.8 mmol/L (ref 3.5–5.1)
Sodium: 136 mmol/L (ref 135–145)

## 2016-07-20 LAB — I-STAT TROPONIN, ED
Troponin i, poc: 0.02 ng/mL (ref 0.00–0.08)
Troponin i, poc: 0.03 ng/mL (ref 0.00–0.08)

## 2016-07-20 LAB — CBC
HCT: 33.9 % — ABNORMAL LOW (ref 36.0–46.0)
Hemoglobin: 10.9 g/dL — ABNORMAL LOW (ref 12.0–15.0)
MCH: 26.3 pg (ref 26.0–34.0)
MCHC: 32.2 g/dL (ref 30.0–36.0)
MCV: 81.7 fL (ref 78.0–100.0)
Platelets: 360 10*3/uL (ref 150–400)
RBC: 4.15 MIL/uL (ref 3.87–5.11)
RDW: 14.4 % (ref 11.5–15.5)
WBC: 13 10*3/uL — ABNORMAL HIGH (ref 4.0–10.5)

## 2016-07-20 MED ORDER — TETANUS-DIPHTH-ACELL PERTUSSIS 5-2.5-18.5 LF-MCG/0.5 IM SUSP
0.5000 mL | Freq: Once | INTRAMUSCULAR | Status: AC
  Administered 2016-07-20: 0.5 mL via INTRAMUSCULAR
  Filled 2016-07-20: qty 0.5
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
## 2016-07-20 NOTE — ED Notes (Signed)
Pt to xray
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
## 2016-07-23 ENCOUNTER — Encounter: Payer: Self-pay | Admitting: Family

## 2016-07-23 ENCOUNTER — Telehealth: Payer: Self-pay | Admitting: Family

## 2016-07-23 ENCOUNTER — Ambulatory Visit: Payer: Medicaid Other | Admitting: Family

## 2016-07-23 NOTE — Telephone Encounter (Signed)
Patient did not show for her [facility] appointment on 07/23/16. Will attempt to reschedule.

## 2016-07-27 ENCOUNTER — Encounter: Payer: Self-pay | Admitting: Family

## 2016-07-29 ENCOUNTER — Encounter: Payer: Self-pay | Admitting: Unknown Physician Specialty

## 2016-07-30 ENCOUNTER — Encounter: Payer: Self-pay | Admitting: *Deleted

## 2016-07-30 ENCOUNTER — Ambulatory Visit (INDEPENDENT_AMBULATORY_CARE_PROVIDER_SITE_OTHER): Payer: Medicaid Other | Admitting: Unknown Physician Specialty

## 2016-07-30 ENCOUNTER — Encounter: Payer: Self-pay | Admitting: Unknown Physician Specialty

## 2016-07-30 VITALS — BP 137/85 | HR 79 | Temp 98.5°F | Ht 66.3 in | Wt 208.4 lb

## 2016-07-30 DIAGNOSIS — J0100 Acute maxillary sinusitis, unspecified: Secondary | ICD-10-CM | POA: Diagnosis not present

## 2016-07-30 MED ORDER — AMOXICILLIN-POT CLAVULANATE 875-125 MG PO TABS: 1.0000 | ORAL_TABLET | Freq: Two times a day (BID) | ORAL | 0 refills | Status: DC

## 2016-07-30 MED ORDER — BENZONATATE 200 MG PO CAPS: 200.0000 mg | ORAL_CAPSULE | Freq: Two times a day (BID) | ORAL | 0 refills | Status: DC | PRN

## 2016-07-30 NOTE — Progress Notes (Signed)
BP 137/85 (BP Location: Left Arm, Patient Position: Sitting, Cuff Size: Large)   Pulse 79   Temp 98.5 F (36.9 C)   Ht 5' 6.3" (1.684 m)   Wt 208 lb 6.4 oz (94.5 kg)   LMP 06/28/2016 (Exact Date)   SpO2 97%   BMI 33.33 kg/m    Subjective:    Patient ID: [name] [name], female    DOB: Oct 10, 1981, 35 y.o.   MRN: [id number]  HPI: [name] [name] is a 35 y.o. female  Chief Complaint  Patient presents with  . URI    pt states she has had runny and stuffy nose, cough, and on and off fever for about a week. States her throat started hurting about 3 or 4 days ago    HPI   Cough: Patient complains of nonproductive cough, productive cough with sputum described as yellow, sore throat and sweats.  Symptoms began 8 days ago.  The cough is non-productive, without wheezing, dyspnea or hemoptysis, productive of green/yellow sputum and is aggravated by nothing Associated symptoms include:fever. Patient does not have new pets. Patient does not have a history of asthma. Patient does not have a history of environmental allergens. Patient has not recent travel. Patient does not have a history of smoking. Patient  does not have previous Chest X-ray. Patient has not had a PPD done.   Relevant past medical, surgical, family and social history reviewed and updated as indicated. Interim medical history since our last visit reviewed. Allergies and medications reviewed and updated.  Review of Systems  Per HPI unless specifically indicated above     Objective:    BP 137/85 (BP Location: Left Arm, Patient Position: Sitting, Cuff Size: Large)   Pulse 79   Temp 98.5 F (36.9 C)   Ht 5' 6.3" (1.684 m)   Wt 208 lb 6.4 oz (94.5 kg)   LMP 06/28/2016 (Exact Date)   SpO2 97%   BMI 33.33 kg/m   Wt Readings from Last 3 Encounters:  07/30/16 208 lb 6.4 oz (94.5 kg)  07/20/16 202 lb (91.6 kg)  07/01/16 202 lb 6.4 oz (91.8 kg)    Physical Exam  Constitutional: She is oriented to person, place, and time.  She appears well-developed and well-nourished. No distress.  HENT:  Head: Normocephalic and atraumatic.  Right Ear: Tympanic membrane and ear canal normal.  Left Ear: Tympanic membrane and ear canal normal.  Nose: Rhinorrhea present. Right sinus exhibits no maxillary sinus tenderness and no frontal sinus tenderness. Left sinus exhibits no maxillary sinus tenderness and no frontal sinus tenderness.  Mouth/Throat: Mucous membranes are normal. Posterior oropharyngeal erythema present.  Eyes: Conjunctivae and lids are normal. Right eye exhibits no discharge. Left eye exhibits no discharge. No scleral icterus.  Cardiovascular: Normal rate and regular rhythm.   Pulmonary/Chest: Effort normal and breath sounds normal. No respiratory distress.  Abdominal: Normal appearance. There is no splenomegaly or hepatomegaly.  Musculoskeletal: Normal range of motion.  Neurological: She is alert and oriented to person, place, and time.  Skin: Skin is intact. No rash noted. No pallor.  Psychiatric: She has a normal mood and affect. Her behavior is normal. Judgment and thought content normal.    Results for orders placed or performed during the hospital encounter of [id number]  Basic metabolic panel  Result Value Ref Range   Sodium 136 135 - 145 mmol/L   Potassium 3.8 3.5 - 5.1 mmol/L   Chloride 106 101 - 111 mmol/L   CO2 21 (L) 22 -  32 mmol/L   Glucose, Bld 183 (H) 65 - 99 mg/dL   BUN 21 (H) 6 - 20 mg/dL   Creatinine, Ser 1.16 (H) 0.44 - 1.00 mg/dL   Calcium 9.0 8.9 - 10.3 mg/dL   GFR calc non Af Amer 60 (L) >60 mL/min   GFR calc Af Amer >60 >60 mL/min   Anion gap 9 5 - 15  CBC  Result Value Ref Range   WBC 13.0 (H) 4.0 - 10.5 K/uL   RBC 4.15 3.87 - 5.11 MIL/uL   Hemoglobin 10.9 (L) 12.0 - 15.0 g/dL   HCT 33.9 (L) 36.0 - 46.0 %   MCV 81.7 78.0 - 100.0 fL   MCH 26.3 26.0 - 34.0 pg   MCHC 32.2 30.0 - 36.0 g/dL   RDW 14.4 11.5 - 15.5 %   Platelets 360 150 - 400 K/uL  I-stat troponin, ED  Result  Value Ref Range   Troponin i, poc 0.02 0.00 - 0.08 ng/mL   Comment 3          I-Stat Troponin, ED (not at [facility])  Result Value Ref Range   Troponin i, poc 0.03 0.00 - 0.08 ng/mL   Comment 3              Assessment & Plan:   Problem List Items Addressed This Visit    None    Visit Diagnoses    Acute maxillary sinusitis, recurrence not specified    -  Primary   Rx for augmentin.  Let me know if no improvement   Relevant Medications   amoxicillin-clavulanate (AUGMENTIN) 875-125 MG tablet   benzonatate (TESSALON) 200 MG capsule       Follow up plan: Return if symptoms worsen or fail to improve.

## 2016-07-31 ENCOUNTER — Ambulatory Visit: Payer: Medicaid Other | Admitting: Anesthesiology

## 2016-07-31 ENCOUNTER — Encounter: Payer: Self-pay | Admitting: *Deleted

## 2016-07-31 ENCOUNTER — Encounter
Admission: RE | Disposition: A | Discharge status: Home / Self Care | Payer: Self-pay | Source: Ambulatory Visit | Attending: Gastroenterology

## 2016-07-31 ENCOUNTER — Ambulatory Visit
Admission: RE | Admit: 2016-07-31 | Discharge: 2016-07-31 | Disposition: A | Discharge status: Home / Self Care | Payer: Medicaid Other | Source: Ambulatory Visit | Attending: Gastroenterology | Admitting: Gastroenterology

## 2016-07-31 DIAGNOSIS — R110 Nausea: Secondary | ICD-10-CM | POA: Diagnosis present

## 2016-07-31 DIAGNOSIS — E785 Hyperlipidemia, unspecified: Secondary | ICD-10-CM | POA: Insufficient documentation

## 2016-07-31 DIAGNOSIS — D509 Iron deficiency anemia, unspecified: Secondary | ICD-10-CM | POA: Diagnosis not present

## 2016-07-31 DIAGNOSIS — R1084 Generalized abdominal pain: Secondary | ICD-10-CM | POA: Diagnosis present

## 2016-07-31 DIAGNOSIS — I2510 Atherosclerotic heart disease of native coronary artery without angina pectoris: Secondary | ICD-10-CM | POA: Insufficient documentation

## 2016-07-31 DIAGNOSIS — Z87891 Personal history of nicotine dependence: Secondary | ICD-10-CM | POA: Diagnosis not present

## 2016-07-31 DIAGNOSIS — Z7982 Long term (current) use of aspirin: Secondary | ICD-10-CM | POA: Diagnosis not present

## 2016-07-31 DIAGNOSIS — R194 Change in bowel habit: Secondary | ICD-10-CM | POA: Insufficient documentation

## 2016-07-31 DIAGNOSIS — R1011 Right upper quadrant pain: Secondary | ICD-10-CM | POA: Insufficient documentation

## 2016-07-31 DIAGNOSIS — I255 Ischemic cardiomyopathy: Secondary | ICD-10-CM | POA: Insufficient documentation

## 2016-07-31 DIAGNOSIS — Z6835 Body mass index (BMI) 35.0-35.9, adult: Secondary | ICD-10-CM | POA: Insufficient documentation

## 2016-07-31 DIAGNOSIS — I5022 Chronic systolic (congestive) heart failure: Secondary | ICD-10-CM | POA: Diagnosis not present

## 2016-07-31 DIAGNOSIS — I252 Old myocardial infarction: Secondary | ICD-10-CM | POA: Insufficient documentation

## 2016-07-31 DIAGNOSIS — I130 Hypertensive heart and chronic kidney disease with heart failure and stage 1 through stage 4 chronic kidney disease, or unspecified chronic kidney disease: Secondary | ICD-10-CM | POA: Diagnosis not present

## 2016-07-31 DIAGNOSIS — Z794 Long term (current) use of insulin: Secondary | ICD-10-CM | POA: Diagnosis not present

## 2016-07-31 DIAGNOSIS — K5730 Diverticulosis of large intestine without perforation or abscess without bleeding: Secondary | ICD-10-CM | POA: Diagnosis not present

## 2016-07-31 DIAGNOSIS — E1122 Type 2 diabetes mellitus with diabetic chronic kidney disease: Secondary | ICD-10-CM | POA: Insufficient documentation

## 2016-07-31 DIAGNOSIS — Z955 Presence of coronary angioplasty implant and graft: Secondary | ICD-10-CM | POA: Insufficient documentation

## 2016-07-31 DIAGNOSIS — Z79899 Other long term (current) drug therapy: Secondary | ICD-10-CM | POA: Insufficient documentation

## 2016-07-31 DIAGNOSIS — E669 Obesity, unspecified: Secondary | ICD-10-CM | POA: Insufficient documentation

## 2016-07-31 DIAGNOSIS — Z888 Allergy status to other drugs, medicaments and biological substances status: Secondary | ICD-10-CM | POA: Insufficient documentation

## 2016-07-31 DIAGNOSIS — R1013 Epigastric pain: Secondary | ICD-10-CM | POA: Insufficient documentation

## 2016-07-31 DIAGNOSIS — Z91013 Allergy to seafood: Secondary | ICD-10-CM | POA: Insufficient documentation

## 2016-07-31 DIAGNOSIS — R197 Diarrhea, unspecified: Secondary | ICD-10-CM | POA: Diagnosis not present

## 2016-07-31 DIAGNOSIS — Z91048 Other nonmedicinal substance allergy status: Secondary | ICD-10-CM | POA: Insufficient documentation

## 2016-07-31 DIAGNOSIS — E119 Type 2 diabetes mellitus without complications: Secondary | ICD-10-CM | POA: Insufficient documentation

## 2016-07-31 DIAGNOSIS — N189 Chronic kidney disease, unspecified: Secondary | ICD-10-CM | POA: Insufficient documentation

## 2016-07-31 HISTORY — PX: COLONOSCOPY WITH PROPOFOL: SHX5780

## 2016-07-31 HISTORY — PX: ESOPHAGOGASTRODUODENOSCOPY (EGD) WITH PROPOFOL: SHX5813

## 2016-07-31 LAB — POCT PREGNANCY, URINE: Preg Test, Ur: NEGATIVE

## 2016-07-31 LAB — GLUCOSE, CAPILLARY: Glucose-Capillary: 100 mg/dL — ABNORMAL HIGH (ref 65–99)

## 2016-07-31 SURGERY — COLONOSCOPY WITH PROPOFOL
Anesthesia: General

## 2016-07-31 MED ORDER — PROPOFOL 500 MG/50ML IV EMUL
INTRAVENOUS | Status: DC | PRN
  Administered 2016-07-31: 100 ug/kg/min via INTRAVENOUS

## 2016-07-31 MED ORDER — SODIUM CHLORIDE 0.9 % IV SOLN
INTRAVENOUS | Status: DC
  Administered 2016-07-31: 11:00:00 via INTRAVENOUS

## 2016-07-31 MED ORDER — FENTANYL CITRATE (PF) 100 MCG/2ML IJ SOLN
INTRAMUSCULAR | Status: DC | PRN
  Administered 2016-07-31: 50 ug via INTRAVENOUS

## 2016-07-31 MED ORDER — PROPOFOL 10 MG/ML IV BOLUS
INTRAVENOUS | Status: DC | PRN
  Administered 2016-07-31: 20 mg via INTRAVENOUS
  Administered 2016-07-31: 100 mg via INTRAVENOUS
  Administered 2016-07-31: 20 mg via INTRAVENOUS
  Administered 2016-07-31: 40 mg via INTRAVENOUS

## 2016-07-31 MED ORDER — MIDAZOLAM HCL 2 MG/2ML IJ SOLN
INTRAMUSCULAR | Status: DC | PRN
  Administered 2016-07-31: 1 mg via INTRAVENOUS

## 2016-07-31 MED ORDER — SODIUM CHLORIDE 0.9 % IV SOLN: INTRAVENOUS | Status: DC

## 2016-07-31 NOTE — Transfer of Care (Signed)
Immediate Anesthesia Transfer of Care Note  Patient: [name] [name]  Procedure(s) Performed: Procedure(s): COLONOSCOPY WITH PROPOFOL (N/A) ESOPHAGOGASTRODUODENOSCOPY (EGD) WITH PROPOFOL (N/A)  Patient Location: PACU and Endoscopy Unit  Anesthesia Type:General  Level of Consciousness: patient cooperative and lethargic  Airway & Oxygen Therapy: Patient Spontanous Breathing and Patient connected to nasal cannula oxygen  Post-op Assessment: Report given to RN and Post -op Vital signs reviewed and stable  Post vital signs: Reviewed and stable  Last Vitals:  Vitals:   07/31/16 1242 07/31/16 1243  BP: (P) 121/63 128/76  Pulse:  78  Resp:  18  Temp: (!) (P) 36.1 C     Last Pain:  Vitals:   07/31/16 1242  TempSrc: (P) Tympanic         Complications: No apparent anesthesia complications

## 2016-07-31 NOTE — Op Note (Signed)
[facility]enter[facility] Patient Name: [name] [name] Procedure Date: 07/31/2016 11:44 AM MRN: [id number] Account #: [id number] Date of Birth: 02-06-1981 Admit Type: Outpatient Age: 35 Room: [facility] ENDO ROOM 3 Gender: Female Note Status: Finalized Procedure:            Colonoscopy Indications:          Generalized abdominal pain, Chronic diarrhea Providers:            Lollie Sails, MD Referring MD:         Kathrine Haddock (Referring MD) Medicines:            Monitored Anesthesia Care Complications:        No immediate complications. Procedure:            Pre-Anesthesia Assessment:                       - ASA Grade Assessment: III - A patient with severe                        systemic disease.                       After obtaining informed consent, the colonoscope was                        passed under direct vision. Throughout the procedure,                        the patient's blood pressure, pulse, and oxygen                        saturations were monitored continuously. The                        Colonoscope was introduced through the anus and                        advanced to the the cecum, identified by appendiceal                        orifice and ileocecal valve. The colonoscopy was                        performed with moderate difficulty due to poor bowel                        prep. Successful completion of the procedure was aided                        by lavage. Findings:      marked motility throughout      A few small-mouthed diverticula were found in the sigmoid colon.      Biopsies for histology were taken with a cold forceps from the right       colon and left colon for evaluation of microscopic colitis.      The exam was otherwise without abnormality. Impression:           - Diverticulosis in the sigmoid colon.                       - The examination was otherwise normal.                       -  Biopsies were taken with a cold  forceps from the                        right colon and left colon for evaluation of                        microscopic colitis. Recommendation:       - Discharge patient to home. Procedure Code(s):    --- Professional ---                       [phone], Colonoscopy, flexible; with biopsy, single or                        multiple Diagnosis Code(s):    --- Professional ---                       R10.84, Generalized abdominal pain                       K52.9, Noninfective gastroenteritis and colitis,                        unspecified                       K57.30, Diverticulosis of large intestine without                        perforation or abscess without bleeding CPT copyright 2016 [employer]. All rights reserved. The codes documented in this report are preliminary and upon coder review may  be revised to meet current compliance requirements. Lollie Sails, MD 07/31/2016 12:41:25 PM This report has been signed electronically. Number of Addenda: 0 Note Initiated On: 07/31/2016 11:44 AM Scope Withdrawal Time: 0 hours 14 minutes 56 seconds  Total Procedure Duration: 0 hours 29 minutes 0 seconds       [facility]

## 2016-07-31 NOTE — Anesthesia Preprocedure Evaluation (Addendum)
Anesthesia Evaluation  Patient identified by MRN, date of birth, ID band Patient awake    Reviewed: Allergy & Precautions, NPO status , Patient's Chart, lab work & pertinent test results  History of Anesthesia Complications Negative for: history of anesthetic complications  Airway Mallampati: III  TM Distance: >3 FB Neck ROM: Full    Dental  (+) Poor Dentition   Pulmonary shortness of breath, neg sleep apnea, neg COPD, former smoker,    breath sounds clear to auscultation- rhonchi (-) wheezing      Cardiovascular hypertension, Pt. on medications and Pt. on home beta blockers + angina + CAD, + Past MI, + Cardiac Stents and +CHF   Rhythm:Regular Rate:Normal - Systolic murmurs and - Diastolic murmurs NM stress test 03/01/16:  There was no ST segment deviation noted during stress.  Defect 1: There is a medium defect of moderate severity present in the basal inferolateral and mid inferolateral location.  Findings consistent with prior myocardial infarction. No ischemia.  This is a low risk study.  The left ventricular ejection fraction is normal (55-65%).  L heart cath 05/05/15:  DES in ramus intermediate is patent  DES in LCx is patent.  Prox LAD lesion, 40% stenosed.   1. Nonobstructive CAD. Stents in the LCx and ramus intermediate are widely patent. 2. Normal LV function. Normal LVEDP.   Neuro/Psych negative psych ROS   GI/Hepatic negative GI ROS, Neg liver ROS,   Endo/Other  diabetes, Type 2, Insulin Dependent  Renal/GU CRFRenal disease     Musculoskeletal   Abdominal (+) + obese,   Peds  Hematology  (+) anemia ,   Anesthesia Other Findings   Reproductive/Obstetrics                            Anesthesia Physical Anesthesia Plan  ASA: III  Anesthesia Plan: General   Post-op Pain Management:    Induction: Intravenous  Airway Management Planned: Natural Airway  Additional  Equipment:   Intra-op Plan:   Post-operative Plan:   Informed Consent: I have reviewed the patients History and Physical, chart, labs and discussed the procedure including the risks, benefits and alternatives for the proposed anesthesia with the patient or authorized representative who has indicated his/her understanding and acceptance.   Dental advisory given  Plan Discussed with: CRNA and Anesthesiologist  Anesthesia Plan Comments:         Anesthesia Quick Evaluation

## 2016-07-31 NOTE — Op Note (Signed)
[facility] Gastroenterology Patient Name: [name] [name] Procedure Date: 07/31/2016 11:45 AM MRN: [id number] Account #: [id number] Date of Birth: [birth year] Admit Type: Outpatient Age: 35 Room: [facility] ENDO ROOM 3 Gender: Female Note Status: Finalized Procedure:            Upper GI endoscopy Indications:          Epigastric abdominal pain, Abdominal pain in the right                        upper quadrant Providers:            Lollie Sails, MD Referring MD:         Kathrine Haddock (Referring MD) Medicines:            Monitored Anesthesia Care Complications:        No immediate complications. Procedure:            Pre-Anesthesia Assessment:                       - ASA Grade Assessment: III - A patient with severe                        systemic disease.                       After obtaining informed consent, the endoscope was                        passed under direct vision. Throughout the procedure,                        the patient's blood pressure, pulse, and oxygen                        saturations were monitored continuously. The Endoscope                        was introduced through the mouth, and advanced to the                        third part of duodenum. The upper GI endoscopy was                        accomplished without difficulty. The patient tolerated                        the procedure well. Findings:      The examined esophagus was normal.      A small amount of food (residue) was found in the gastric body.      The exam of the stomach was otherwise normal.      The cardia and gastric fundus were normal on retroflexion.      The examined duodenum was normal.      A small non-bleeding diverticulum was found in the second portion of the       duodenum. Impression:           - Normal esophagus.                       - A small amount of food (residue) in the stomach.                       -  Normal examined duodenum.                       -  No specimens collected. Recommendation:       - Do a gastric emptying study at appointment to be                        scheduled.                       - Perform a HIDA (hepatobiliary iminodiacetic acid)                        scan at appointment to be scheduled.                       - Continue present medications. Procedure Code(s):    --- Professional ---                       [phone], Esophagogastroduodenoscopy, flexible, transoral;                        diagnostic, including collection of specimen(s) by                        brushing or washing, when performed (separate procedure) Diagnosis Code(s):    --- Professional ---                       R10.13, Epigastric pain                       R10.11, Right upper quadrant pain CPT copyright 2016 [employer]. All rights reserved. The codes documented in this report are preliminary and upon coder review may  be revised to meet current compliance requirements. Lollie Sails, MD 07/31/2016 12:05:03 PM This report has been signed electronically. Number of Addenda: 0 Note Initiated On: 07/31/2016 11:45 AM      [facility]

## 2016-07-31 NOTE — H&P (Signed)
Outpatient short stay form Pre-procedure 07/31/2016 11:42 AM Lollie Sails MD  Primary Physician: Dr. Golden Pop  Reason for visit:  EGD and colonoscopy  History of present illness:  Patient is a 35 year old female presenting today as above. She has been having problems with chronic diarrhea beginning at about the time of her heart attack and stent placements about a year ago. This is been a chronic issue for her. Says been having some epigastric pain as well as right upper quadrant discomfort that discomfort radiates toward her back. She has had abdominal ultrasound which was relatively unremarkable. She has not had a HIDA CCK study. She does take a proton pump inhibitor but the timing has been off with a meal. We discussed that today.  She tolerated her prep well. She does take Brilinta as well as a daily aspirin and has been holding these for over 5 days.    Current Facility-Administered Medications:  .  0.9 %  sodium chloride infusion, , Intravenous, Continuous, Lollie Sails, MD, Last Rate: 50 mL/hr at 07/31/16 1104 .  0.9 %  sodium chloride infusion, , Intravenous, Continuous, Lollie Sails, MD  Prescriptions Prior to Admission  Medication Sig Dispense Refill Last Dose  . amoxicillin-clavulanate (AUGMENTIN) 875-125 MG tablet Take 1 tablet by mouth 2 (two) times daily. 20 tablet 0 07/30/2016 at Unknown time  . aspirin EC 81 MG tablet Take 81 mg by mouth daily.   Past Week at Unknown time  . benzonatate (TESSALON) 200 MG capsule Take 1 capsule (200 mg total) by mouth 2 (two) times daily as needed for cough. 20 capsule 0 07/31/2016 at 0730  . dicyclomine (BENTYL) 20 MG tablet Take 20 mg by mouth 4 (four) times daily as needed.   07/31/2016 at 0730  . ferrous sulfate 325 (65 FE) MG tablet Take 1 tablet (325 mg total) by mouth 3 (three) times daily with meals. (Patient taking differently: Take 325 mg by mouth daily with breakfast. ) 90 tablet 3 Past Week at Unknown time  .  glimepiride (AMARYL) 4 MG tablet Take 1 tablet (4 mg total) by mouth daily. 90 tablet 1 07/30/2016 at Unknown time  . loperamide (IMODIUM) 2 MG capsule Take 2 mg by mouth as needed for diarrhea or loose stools.   Past Week at Unknown time  . Melatonin 5 MG TABS Take 5 mg by mouth daily.   07/30/2016 at Unknown time  . metoprolol tartrate (LOPRESSOR) 25 MG tablet Take 1 tablet (25 mg total) by mouth 2 (two) times daily. 60 tablet 3 07/31/2016 at 0730  . nitroGLYCERIN (NITROSTAT) 0.4 MG SL tablet Place 1 tablet (0.4 mg total) under the tongue every 5 (five) minutes as needed for chest pain. Reported on 02/20/2016 25 tablet 3 07/20/2016 at Unknown time  . sitaGLIPtin (JANUVIA) 100 MG tablet Take 1 tablet (100 mg total) by mouth daily. 30 tablet 2 07/30/2016 at Unknown time  . ticagrelor (BRILINTA) 90 MG TABS tablet Take 1 tablet (90 mg total) by mouth 2 (two) times daily. 60 tablet 2 Past Week at Unknown time  . venlafaxine XR (EFFEXOR-XR) 37.5 MG 24 hr capsule Take 37.5 mg by mouth daily with breakfast.   07/31/2016 at 0730  . blood glucose meter kit and supplies KIT Dispense based on patient and insurance preference. Use up to four times daily as directed. E11.22. 1 each 0 Taking  . etonogestrel (NEXPLANON) 68 MG IMPL implant 1 each by Subdermal route once.   Taking  .  glucose blood (ACCU-CHEK ACTIVE STRIPS) test strip Use as instructed 100 each 12 Taking  . Insulin Syringe-Needle U-100 (INSULIN SYRINGE 1CC/31GX5/16") 31G X 5/16" 1 ML MISC 1 Units by Does not apply route at bedtime. 90 each 3 Taking  . meclizine (ANTIVERT) 32 MG tablet Take 1 tablet (32 mg total) by mouth 3 (three) times daily as needed. 30 tablet 0 Taking  . pantoprazole (PROTONIX) 40 MG tablet Take 1 tablet (40 mg total) by mouth daily at 6 (six) AM. 90 tablet 1 Taking     Allergies  Allergen Reactions  . Lisinopril Anaphylaxis, Itching and Swelling  . Rosemary Oil Anaphylaxis  . Shellfish Allergy Itching and Swelling  . Metformin  And Related Diarrhea, Nausea And Vomiting and Rash     Past Medical History:  Diagnosis Date  . CAD (coronary artery disease)    a. NSTEMI 03/2015: mLCx 100% s/p PCI/DES, RI 90% s/p PCI/DES, EF 35%; b. 04/2015 Relook Cath: LM nl, LAD 40p, RI patent stent, LCX patent stent, RCA nl, EF 55-65%.  . Chronic systolic CHF (congestive heart failure) ([place])    a. echo 03/2015: EF 30-35%, mild concentric LVH, severe HK of inf, inflat, & lat walls, mod MR, mild TR;  b. 04/2015 LV Gram: EF 55-65%.  . DM2 (diabetes mellitus, type 2) ([place])    a. since 2002  . Heavy menses    a. H/O IUD - expired in 2014 - remains in place.  . Hyperlipidemia   . Hypertension   . Hypotension    a. resulting in discontinuation of losartan and reduction in coreg dose.  . Iron deficiency anemia   . Ischemic cardiomyopathy    a. 03/2015 EF 30-35% post NSTEMI;  b. 04/2015 EF 55-65% on LV gram.  . Kidney disease   . MI (myocardial infarction) ([place]) 03/29/2015  . Obesity   . Renal insufficiency   . Tobacco abuse    a. quit 03/2015.  Marland Kitchen Uterine fibroid     Review of systems:      Physical Exam    Heart and lungs: Regular rate and rhythm without rub or gallop, lungs are bilaterally clear.    HEENT: Normocephalic atraumatic eyes are anicteric    Other:     Pertinant exam for procedure: Soft positive tender to palpation in the epigastric region as well as the right upper quadrant. There are no masses rebound. There is a slight increase of right upper quadrant discomfort on inspiration with palpation. There are no masses or rebound. Bowel sounds are positive normoactive.    Planned proceedures: EGD and colonoscopy with indicated procedures. I have discussed the risks benefits and complications of procedures to include not limited to bleeding, infection, perforation and the risk of sedation and the patient wishes to proceed.    Lollie Sails, MD Gastroenterology 07/31/2016  11:42 AM

## 2016-07-31 NOTE — Anesthesia Postprocedure Evaluation (Signed)
Anesthesia Post Note  Patient: Ramon Ridling  Procedure(s) Performed: Procedure(s) (LRB): COLONOSCOPY WITH PROPOFOL (N/A) ESOPHAGOGASTRODUODENOSCOPY (EGD) WITH PROPOFOL (N/A)  Patient location during evaluation: PACU Anesthesia Type: General Level of consciousness: awake and alert and oriented Pain management: pain level controlled Vital Signs Assessment: post-procedure vital signs reviewed and stable Respiratory status: spontaneous breathing, nonlabored ventilation and respiratory function stable Cardiovascular status: blood pressure returned to baseline and stable Postop Assessment: no signs of nausea or vomiting Anesthetic complications: no    Last Vitals:  Vitals:   07/31/16 1252 07/31/16 1302  BP: 140/84 (!) 157/94  Pulse: 78 78  Resp: 13 14  Temp:      Last Pain:  Vitals:   07/31/16 1242  TempSrc: Tympanic                 Oryon Gary

## 2016-08-01 ENCOUNTER — Ambulatory Visit (INDEPENDENT_AMBULATORY_CARE_PROVIDER_SITE_OTHER): Payer: Medicaid Other | Admitting: Cardiovascular Disease

## 2016-08-01 ENCOUNTER — Other Ambulatory Visit: Payer: Self-pay | Admitting: Cardiovascular Disease

## 2016-08-01 ENCOUNTER — Encounter: Payer: Self-pay | Admitting: Gastroenterology

## 2016-08-01 VITALS — BP 118/72 | HR 82 | Ht 64.0 in | Wt 209.8 lb

## 2016-08-01 DIAGNOSIS — Z95820 Peripheral vascular angioplasty status with implants and grafts: Secondary | ICD-10-CM

## 2016-08-01 DIAGNOSIS — Z959 Presence of cardiac and vascular implant and graft, unspecified: Secondary | ICD-10-CM | POA: Diagnosis not present

## 2016-08-01 DIAGNOSIS — E785 Hyperlipidemia, unspecified: Secondary | ICD-10-CM

## 2016-08-01 DIAGNOSIS — I5022 Chronic systolic (congestive) heart failure: Secondary | ICD-10-CM

## 2016-08-01 DIAGNOSIS — I2510 Atherosclerotic heart disease of native coronary artery without angina pectoris: Secondary | ICD-10-CM

## 2016-08-01 LAB — SURGICAL PATHOLOGY

## 2016-08-01 MED ORDER — TICAGRELOR 90 MG PO TABS: 90.0000 mg | ORAL_TABLET | Freq: Two times a day (BID) | ORAL | 3 refills | Status: DC

## 2016-08-01 NOTE — Progress Notes (Signed)
Cardiology Office Note   Date:  08/01/2016   ID:  [name], [name] November 16, 1981, MRN [id number]  PCP:  Kathrine Haddock, NP  Cardiologist:   Kathlyn Sacramento, MD   Chief Complaint  Patient presents with  . Other    C/o edema right ankle. Meds reviewed verbally with pt.      History of Present Illness: [name] [name] is a 35 y.o. female who presents for a follow-up visit regarding coronary artery disease and chronic systolic heart failure.  She has prolonged history of type 2 diabetes since 2002 , prolonged history of tobacco use, and obesity.  She presented [facility] April of 2016 with NSTEMI. Echocardiogram showed an ejection fraction of 30% with severe inferior, inferolateral and lateral wall hypokinesis with moderate mitral regurgitation. Cardiac catheterization showed occluded mid left circumflex with significant disease affecting the proximal OM1. There was mild disease affecting the LAD and minor irregularities in the RCA. Ejection fraction was 35%. I performed successful angioplasty and drug-eluting stent placement to the mid LCX and proximal OM . She underwent a repeat cardiac catheterization in May 2016 which showed patent stents in the left circumflex and OM1. Ejection fraction was 50-55%.  She was hospitalized at [facility] in March, 2017 for chest pain thought to be due to panic attacks. She ruled out for myocardial infarction. She underwent a nuclear stress test showed evidence of prior left circumflex infarct without ischemia with normal ejection fraction. She had issues with hypotension that required discontinuation of losartan. During last visit, blood pressure was somewhat low with tachycardia. I switched carvedilol to metoprolol. Due to abnormal liver enzymes and myalgia, I discontinued atorvastatin. Her labs last month showed mild anemia and mild renal failure with a creatinine of 1.67 and BUN of 26. TSH was normal. We asked her to increase her fluid intake. She had an emergency  room visit on August 12 for chest pain. Her troponin was negative. Labs showed improvement in renal function with a creatinine 1.16.  She reports no recurrent chest pain. She is overall feeling better. Myalgia resolved after stopping atorvastatin.  Past Medical History:  Diagnosis Date  . CAD (coronary artery disease)    a. NSTEMI 03/2015: mLCx 100% s/p PCI/DES, RI 90% s/p PCI/DES, EF 35%; b. 04/2015 Relook Cath: LM nl, LAD 40p, RI patent stent, LCX patent stent, RCA nl, EF 55-65%.  . Chronic systolic CHF (congestive heart failure) ([place])    a. echo 03/2015: EF 30-35%, mild concentric LVH, severe HK of inf, inflat, & lat walls, mod MR, mild TR;  b. 04/2015 LV Gram: EF 55-65%.  . DM2 (diabetes mellitus, type 2) ([place])    a. since 2002  . Heavy menses    a. H/O IUD - expired in 2014 - remains in place.  . Hyperlipidemia   . Hypertension   . Hypotension    a. resulting in discontinuation of losartan and reduction in coreg dose.  . Iron deficiency anemia   . Ischemic cardiomyopathy    a. 03/2015 EF 30-35% post NSTEMI;  b. 04/2015 EF 55-65% on LV gram.  . Kidney disease   . MI (myocardial infarction) ([place]) 03/29/2015  . Obesity   . Renal insufficiency   . Tobacco abuse    a. quit 03/2015.  Marland Kitchen Uterine fibroid     Past Surgical History:  Procedure Laterality Date  . CARDIAC CATHETERIZATION  4/16   x2 stent [facility]  . CARDIAC CATHETERIZATION N/A 05/05/2015   Procedure: Left Heart Cath and  Coronary Angiography;  Surgeon: Peter M [place]az[place]and, MD;  Location: [facility] INVASIVE CV LAB;  Service: Cardiovascular;  Laterality: N/A;  . COLONOSCOPY WITH PROPOFOL N/A 07/31/2016   Procedure: COLONOSCOPY WITH PROPOFOL;  Surgeon: Christena Deem, MD;  Location: [facility] ENDOSCOPY;  Service: Endoscopy;  Laterality: N/A;  . ESOPHAGOGASTRODUODENOSCOPY (EGD) WITH PROPOFOL N/A 07/31/2016   Procedure: ESOPHAGOGASTRODUODENOSCOPY (EGD) WITH PROPOFOL;  Surgeon: Christena Deem, MD;  Location: [facility] ENDOSCOPY;  Service: Endoscopy;   Laterality: N/A;  . MOUTH SURGERY       Current Outpatient Prescriptions  Medication Sig Dispense Refill  . amoxicillin-clavulanate (AUGMENTIN) 875-125 MG tablet Take 1 tablet by mouth 2 (two) times daily. 20 tablet 0  . aspirin EC 81 MG tablet Take 81 mg by mouth daily.    . benzonatate (TESSALON) 200 MG capsule Take 1 capsule (200 mg total) by mouth 2 (two) times daily as needed for cough. 20 capsule 0  . blood glucose meter kit and supplies KIT Dispense based on patient and insurance preference. Use up to four times daily as directed. E11.22. 1 each 0  . dicyclomine (BENTYL) 20 MG tablet Take 20 mg by mouth 4 (four) times daily as needed.    . etonogestrel (NEXPLANON) 68 MG IMPL implant 1 each by Subdermal route once.    . ferrous sulfate 325 (65 FE) MG tablet Take 1 tablet (325 mg total) by mouth 3 (three) times daily with meals. (Patient taking differently: Take 325 mg by mouth daily with breakfast. ) 90 tablet 3  . glimepiride (AMARYL) 4 MG tablet Take 1 tablet (4 mg total) by mouth daily. 90 tablet 1  . glucose blood (ACCU-CHEK ACTIVE STRIPS) test strip Use as instructed 100 each 12  . Insulin Syringe-Needle U-100 (INSULIN SYRINGE 1CC/31GX5/16") 31G X 5/16" 1 ML MISC 1 Units by Does not apply route at bedtime. 90 each 3  . loperamide (IMODIUM) 2 MG capsule Take 2 mg by mouth as needed for diarrhea or loose stools.    . meclizine (ANTIVERT) 32 MG tablet Take 1 tablet (32 mg total) by mouth 3 (three) times daily as needed. 30 tablet 0  . Melatonin 5 MG TABS Take 5 mg by mouth daily.    . metoprolol tartrate (LOPRESSOR) 25 MG tablet Take 1 tablet (25 mg total) by mouth 2 (two) times daily. 60 tablet 3  . nitroGLYCERIN (NITROSTAT) 0.4 MG SL tablet Place 1 tablet (0.4 mg total) under the tongue every 5 (five) minutes as needed for chest pain. Reported on 02/20/2016 25 tablet 3  . pantoprazole (PROTONIX) 40 MG tablet Take 1 tablet (40 mg total) by mouth daily at 6 (six) AM. 90 tablet 1  .  sitaGLIPtin (JANUVIA) 100 MG tablet Take 1 tablet (100 mg total) by mouth daily. 30 tablet 2  . ticagrelor (BRILINTA) 90 MG TABS tablet Take 1 tablet (90 mg total) by mouth 2 (two) times daily. 60 tablet 2  . venlafaxine XR (EFFEXOR-XR) 37.5 MG 24 hr capsule Take 37.5 mg by mouth daily with breakfast.     No current facility-administered medications for this visit.     Allergies:   Lisinopril; Rosemary oil; Shellfish allergy; and Metformin and related    Social History:  The patient  reports that she quit smoking about 16 months ago. She quit after 15.00 years of use. She has never used smokeless tobacco. She reports that she does not drink alcohol or use drugs.   Family History:  The patient's family history includes Cancer in  her maternal grandfather and maternal grandmother; Cancer - Cervical in her maternal aunt; Diabetes in her father and paternal grandfather.    ROS:  Please see the history of present illness.   Otherwise, review of systems are positive for none.   All other systems are reviewed and negative.    PHYSICAL EXAM: VS:  BP 118/72 (BP Location: Left Arm, Patient Position: Sitting, Cuff Size: Large)   Pulse 82   Ht '5\' 4"'$  (1.626 m)   Wt 209 lb 12 oz (95.1 kg)   LMP 06/28/2016 (Exact Date) Comment: urine preg neg  BMI 36.00 kg/m  , BMI Body mass index is 36 kg/m. GEN: Well nourished, well developed, in no acute distress HEENT: normal Neck: no JVD, carotid bruits, or masses Cardiac:Mildly tachycardic; no murmurs, rubs, or gallops,no edema  Respiratory:  clear to auscultation bilaterally, normal work of breathing GI: soft, nontender, nondistended, + BS MS: no deformity or atrophy Skin: warm and dry, no rash Neuro:  Strength and sensation are intact Psych: euthymic mood, full affect   EKG:  EKG is not ordered today.    Recent Labs: 02/13/2016: B Natriuretic Peptide 127.0 06/03/2016: ALT 102 06/27/2016: TSH 2.824 07/20/2016: BUN 21; Creatinine, Ser 1.16; Hemoglobin  10.9; Platelets 360; Potassium 3.8; Sodium 136    Lipid Panel    Component Value Date/Time   CHOL 154 03/06/2016 0900   CHOL 106 08/30/2015 1439   CHOL 133 03/31/2015 0225   TRIG 111 03/06/2016 0900   TRIG 77 03/31/2015 0225   HDL 39 (L) 03/06/2016 0900   HDL 26 (L) 03/31/2015 0225   CHOLHDL 2.9 05/16/2015 1101   VLDL 15 03/31/2015 0225   LDLCALC 93 03/06/2016 0900   LDLCALC 92 03/31/2015 0225      Wt Readings from Last 3 Encounters:  08/01/16 209 lb 12 oz (95.1 kg)  07/31/16 208 lb (94.3 kg)  07/30/16 208 lb 6.4 oz (94.5 kg)        ASSESSMENT AND PLAN:  1.  Coronary artery disease involving native coronary arteries with other forms of angina: She appears to be overall stable from a cardiac standpoint. Continue medical therapy.  Chronic atypical chest pain with no recent worsening.  2. Chronic systolic heart failure: Ejection fraction improved to normal.  No need for diuretics. If anything actually she tends to get volume depleted easily. Tachycardia improved after switching carvedilol to metoprolol.   3. Essential hypertension: Blood pressure is controlled.  4. Hyperlipidemia:   She is getting GI workup for abnormal liver enzymes. Myalgia resolved after stopping atorvastatin. We'll continue with a "statin holiday" for a few months. I am planning to start her on a different statin at that time.   Disposition:   FU with me in 3 months  Signed,  Kathlyn Sacramento, MD  08/01/2016 10:04 AM    [place]

## 2016-08-01 NOTE — Patient Instructions (Signed)
Medication Instructions: Continue same medication.   Labwork: None.   Procedures/Testing: None.   Follow-Up: 3 months with Dr. Fletcher Anon.    Any Additional Special Instructions Will Be Listed Below (If Applicable).     If you need a refill on your cardiac medications before your next appointment, please call your pharmacy.

## 2016-08-05 ENCOUNTER — Emergency Department: Payer: Medicaid Other

## 2016-08-05 ENCOUNTER — Inpatient Hospital Stay
Admission: EM | Admit: 2016-08-05 | Discharge: 2016-08-08 | DRG: 339 | Disposition: A | Discharge status: Home / Self Care | Payer: Medicaid Other | Attending: General Surgery | Admitting: General Surgery

## 2016-08-05 DIAGNOSIS — K352 Acute appendicitis with generalized peritonitis: Principal | ICD-10-CM | POA: Diagnosis present

## 2016-08-05 DIAGNOSIS — Z01810 Encounter for preprocedural cardiovascular examination: Secondary | ICD-10-CM | POA: Diagnosis not present

## 2016-08-05 DIAGNOSIS — F419 Anxiety disorder, unspecified: Secondary | ICD-10-CM | POA: Diagnosis present

## 2016-08-05 DIAGNOSIS — R1084 Generalized abdominal pain: Secondary | ICD-10-CM | POA: Diagnosis present

## 2016-08-05 DIAGNOSIS — K353 Acute appendicitis with localized peritonitis, without perforation or gangrene: Secondary | ICD-10-CM

## 2016-08-05 DIAGNOSIS — K219 Gastro-esophageal reflux disease without esophagitis: Secondary | ICD-10-CM | POA: Diagnosis present

## 2016-08-05 DIAGNOSIS — Z7902 Long term (current) use of antithrombotics/antiplatelets: Secondary | ICD-10-CM

## 2016-08-05 DIAGNOSIS — I252 Old myocardial infarction: Secondary | ICD-10-CM | POA: Diagnosis not present

## 2016-08-05 DIAGNOSIS — E669 Obesity, unspecified: Secondary | ICD-10-CM | POA: Diagnosis present

## 2016-08-05 DIAGNOSIS — Z955 Presence of coronary angioplasty implant and graft: Secondary | ICD-10-CM

## 2016-08-05 DIAGNOSIS — Z79899 Other long term (current) drug therapy: Secondary | ICD-10-CM | POA: Diagnosis not present

## 2016-08-05 DIAGNOSIS — Z6835 Body mass index (BMI) 35.0-35.9, adult: Secondary | ICD-10-CM

## 2016-08-05 DIAGNOSIS — E785 Hyperlipidemia, unspecified: Secondary | ICD-10-CM | POA: Diagnosis present

## 2016-08-05 DIAGNOSIS — I2510 Atherosclerotic heart disease of native coronary artery without angina pectoris: Secondary | ICD-10-CM | POA: Diagnosis not present

## 2016-08-05 DIAGNOSIS — Z794 Long term (current) use of insulin: Secondary | ICD-10-CM

## 2016-08-05 DIAGNOSIS — E1122 Type 2 diabetes mellitus with diabetic chronic kidney disease: Secondary | ICD-10-CM | POA: Diagnosis present

## 2016-08-05 DIAGNOSIS — Z7982 Long term (current) use of aspirin: Secondary | ICD-10-CM | POA: Diagnosis not present

## 2016-08-05 DIAGNOSIS — I340 Nonrheumatic mitral (valve) insufficiency: Secondary | ICD-10-CM | POA: Diagnosis present

## 2016-08-05 DIAGNOSIS — N183 Chronic kidney disease, stage 3 (moderate): Secondary | ICD-10-CM | POA: Diagnosis present

## 2016-08-05 DIAGNOSIS — Z87891 Personal history of nicotine dependence: Secondary | ICD-10-CM

## 2016-08-05 DIAGNOSIS — I130 Hypertensive heart and chronic kidney disease with heart failure and stage 1 through stage 4 chronic kidney disease, or unspecified chronic kidney disease: Secondary | ICD-10-CM | POA: Diagnosis present

## 2016-08-05 DIAGNOSIS — I255 Ischemic cardiomyopathy: Secondary | ICD-10-CM | POA: Diagnosis present

## 2016-08-05 DIAGNOSIS — K37 Unspecified appendicitis: Secondary | ICD-10-CM | POA: Diagnosis present

## 2016-08-05 DIAGNOSIS — D509 Iron deficiency anemia, unspecified: Secondary | ICD-10-CM | POA: Diagnosis present

## 2016-08-05 DIAGNOSIS — I5022 Chronic systolic (congestive) heart failure: Secondary | ICD-10-CM | POA: Diagnosis present

## 2016-08-05 DIAGNOSIS — E1169 Type 2 diabetes mellitus with other specified complication: Secondary | ICD-10-CM | POA: Diagnosis present

## 2016-08-05 LAB — URINALYSIS COMPLETE WITH MICROSCOPIC (ARMC ONLY)
Bacteria, UA: NONE SEEN
Bilirubin Urine: NEGATIVE
Glucose, UA: 50 mg/dL — AB
Leukocytes, UA: NEGATIVE
Nitrite: NEGATIVE
Protein, ur: >500 mg/dL — AB
Specific Gravity, Urine: 1.032 — ABNORMAL HIGH (ref 1.005–1.030)
pH: 5 (ref 5.0–8.0)

## 2016-08-05 LAB — CBC WITH DIFFERENTIAL/PLATELET
Basophils Absolute: 0.1 10*3/uL (ref 0–0.1)
Basophils Relative: 1 %
Eosinophils Absolute: 0.4 10*3/uL (ref 0–0.7)
Eosinophils Relative: 3 %
HCT: 31.4 % — ABNORMAL LOW (ref 35.0–47.0)
Hemoglobin: 10.6 g/dL — ABNORMAL LOW (ref 12.0–16.0)
Lymphocytes Relative: 7 %
Lymphs Abs: 1.2 10*3/uL (ref 1.0–3.6)
MCH: 27.2 pg (ref 26.0–34.0)
MCHC: 33.9 g/dL (ref 32.0–36.0)
MCV: 80.2 fL (ref 80.0–100.0)
Monocytes Absolute: 0.7 10*3/uL (ref 0.2–0.9)
Monocytes Relative: 4 %
Neutro Abs: 14.5 10*3/uL — ABNORMAL HIGH (ref 1.4–6.5)
Neutrophils Relative %: 85 %
Platelets: 315 10*3/uL (ref 150–440)
RBC: 3.91 MIL/uL (ref 3.80–5.20)
RDW: 14.6 % — ABNORMAL HIGH (ref 11.5–14.5)
WBC: 17 10*3/uL — ABNORMAL HIGH (ref 3.6–11.0)

## 2016-08-05 LAB — COMPREHENSIVE METABOLIC PANEL
ALT: 90 U/L — ABNORMAL HIGH (ref 14–54)
AST: 82 U/L — ABNORMAL HIGH (ref 15–41)
Albumin: 3 g/dL — ABNORMAL LOW (ref 3.5–5.0)
Alkaline Phosphatase: 220 U/L — ABNORMAL HIGH (ref 38–126)
Anion gap: 4 — ABNORMAL LOW (ref 5–15)
BUN: 26 mg/dL — ABNORMAL HIGH (ref 6–20)
CO2: 19 mmol/L — ABNORMAL LOW (ref 22–32)
Calcium: 8.4 mg/dL — ABNORMAL LOW (ref 8.9–10.3)
Chloride: 112 mmol/L — ABNORMAL HIGH (ref 101–111)
Creatinine, Ser: 0.99 mg/dL (ref 0.44–1.00)
GFR calc Af Amer: >60 mL/min (ref >60)
GFR calc non Af Amer: >60 mL/min (ref >60)
Glucose, Bld: 175 mg/dL — ABNORMAL HIGH (ref 65–99)
Potassium: 4.6 mmol/L (ref 3.5–5.1)
Sodium: 135 mmol/L (ref 135–145)
Total Bilirubin: 0.8 mg/dL (ref 0.3–1.2)
Total Protein: 6.9 g/dL (ref 6.5–8.1)

## 2016-08-05 LAB — GLUCOSE, CAPILLARY
Glucose-Capillary: 112 mg/dL — ABNORMAL HIGH (ref 65–99)
Glucose-Capillary: 85 mg/dL (ref 65–99)
Glucose-Capillary: 86 mg/dL (ref 65–99)
Glucose-Capillary: 96 mg/dL (ref 65–99)

## 2016-08-05 LAB — URINE DRUG SCREEN, QUALITATIVE (ARMC ONLY)
Amphetamines, Ur Screen: NOT DETECTED
Barbiturates, Ur Screen: NOT DETECTED
Benzodiazepine, Ur Scrn: NOT DETECTED
Cannabinoid 50 Ng, Ur ~~LOC~~: NOT DETECTED
Cocaine Metabolite,Ur ~~LOC~~: NOT DETECTED
MDMA (Ecstasy)Ur Screen: NOT DETECTED
Methadone Scn, Ur: NOT DETECTED
Opiate, Ur Screen: NOT DETECTED
Phencyclidine (PCP) Ur S: NOT DETECTED
Tricyclic, Ur Screen: NOT DETECTED

## 2016-08-05 LAB — PREGNANCY, URINE: Preg Test, Ur: NEGATIVE

## 2016-08-05 LAB — LIPASE, BLOOD: Lipase: 25 U/L (ref 11–51)

## 2016-08-05 LAB — TROPONIN I: Troponin I: <0.03 ng/mL (ref <0.03)

## 2016-08-05 MED ORDER — ONDANSETRON 4 MG PO TBDP
4.0000 mg | ORAL_TABLET | Freq: Four times a day (QID) | ORAL | Status: DC | PRN
  Administered 2016-08-05: 4 mg via ORAL
  Filled 2016-08-05 (×2): qty 1

## 2016-08-05 MED ORDER — FENTANYL CITRATE (PF) 100 MCG/2ML IJ SOLN
50.0000 ug | INTRAMUSCULAR | Status: DC | PRN
  Administered 2016-08-05: 50 ug via INTRAVENOUS
  Filled 2016-08-05: qty 2

## 2016-08-05 MED ORDER — KETOROLAC TROMETHAMINE 30 MG/ML IJ SOLN
15.0000 mg | INTRAMUSCULAR | Status: AC
  Administered 2016-08-05: 15 mg via INTRAVENOUS
  Filled 2016-08-05: qty 1

## 2016-08-05 MED ORDER — DIPHENHYDRAMINE HCL 50 MG/ML IJ SOLN: 25.0000 mg | Freq: Four times a day (QID) | INTRAMUSCULAR | Status: DC | PRN

## 2016-08-05 MED ORDER — DIPHENHYDRAMINE HCL 25 MG PO CAPS: 25.0000 mg | ORAL_CAPSULE | Freq: Four times a day (QID) | ORAL | Status: DC | PRN

## 2016-08-05 MED ORDER — LACTATED RINGERS IV SOLN
INTRAVENOUS | Status: DC
  Administered 2016-08-05 – 2016-08-06 (×3): via INTRAVENOUS

## 2016-08-05 MED ORDER — MORPHINE SULFATE (PF) 2 MG/ML IV SOLN
2.0000 mg | INTRAVENOUS | Status: DC | PRN
  Administered 2016-08-05 – 2016-08-07 (×4): 2 mg via INTRAVENOUS
  Filled 2016-08-05 (×4): qty 1

## 2016-08-05 MED ORDER — SODIUM CHLORIDE 0.9 % IV BOLUS (SEPSIS)
1000.0000 mL | Freq: Once | INTRAVENOUS | Status: AC
  Administered 2016-08-05: 1000 mL via INTRAVENOUS

## 2016-08-05 MED ORDER — MORPHINE SULFATE (PF) 4 MG/ML IV SOLN
4.0000 mg | INTRAVENOUS | Status: DC | PRN
  Administered 2016-08-05 (×2): 4 mg via INTRAVENOUS
  Filled 2016-08-05 (×2): qty 1

## 2016-08-05 MED ORDER — INSULIN ASPART 100 UNIT/ML ~~LOC~~ SOLN: 0.0000 [IU] | SUBCUTANEOUS | Status: DC

## 2016-08-05 MED ORDER — HYDRALAZINE HCL 20 MG/ML IJ SOLN: 10.0000 mg | INTRAMUSCULAR | Status: DC | PRN

## 2016-08-05 MED ORDER — HYDROMORPHONE HCL 1 MG/ML IJ SOLN
0.5000 mg | Freq: Once | INTRAMUSCULAR | Status: AC
Start: 2016-08-05 — End: 2016-08-05
  Administered 2016-08-05: 0.5 mg via INTRAVENOUS

## 2016-08-05 MED ORDER — IOPAMIDOL (ISOVUE-370) INJECTION 76%
100.0000 mL | Freq: Once | INTRAVENOUS | Status: AC | PRN
  Administered 2016-08-05: 100 mL via INTRAVENOUS

## 2016-08-05 MED ORDER — HYDROMORPHONE HCL 1 MG/ML IJ SOLN
INTRAMUSCULAR | Status: AC
  Filled 2016-08-05: qty 1

## 2016-08-05 MED ORDER — FAMOTIDINE IN NACL 20-0.9 MG/50ML-% IV SOLN
20.0000 mg | Freq: Two times a day (BID) | INTRAVENOUS | Status: DC
  Administered 2016-08-05 – 2016-08-08 (×7): 20 mg via INTRAVENOUS
  Filled 2016-08-05 (×9): qty 50

## 2016-08-05 MED ORDER — HEPARIN SODIUM (PORCINE) 5000 UNIT/ML IJ SOLN
5000.0000 [IU] | Freq: Three times a day (TID) | INTRAMUSCULAR | Status: DC
  Administered 2016-08-05 – 2016-08-08 (×8): 5000 [IU] via SUBCUTANEOUS
  Filled 2016-08-05 (×8): qty 1

## 2016-08-05 MED ORDER — METOPROLOL TARTRATE 25 MG PO TABS
25.0000 mg | ORAL_TABLET | Freq: Two times a day (BID) | ORAL | Status: DC
Start: 2016-08-05 — End: 2016-08-08
  Administered 2016-08-05 – 2016-08-08 (×7): 25 mg via ORAL
  Filled 2016-08-05 (×7): qty 1

## 2016-08-05 MED ORDER — ONDANSETRON HCL 4 MG/2ML IJ SOLN
4.0000 mg | Freq: Once | INTRAMUSCULAR | Status: AC
  Administered 2016-08-05: 4 mg via INTRAVENOUS
  Filled 2016-08-05: qty 2

## 2016-08-05 MED ORDER — ASPIRIN EC 81 MG PO TBEC
81.0000 mg | DELAYED_RELEASE_TABLET | Freq: Every day | ORAL | Status: DC
  Administered 2016-08-06 – 2016-08-08 (×2): 81 mg via ORAL
  Filled 2016-08-05 (×2): qty 1

## 2016-08-05 MED ORDER — PIPERACILLIN-TAZOBACTAM 3.375 G IVPB
3.3750 g | Freq: Three times a day (TID) | INTRAVENOUS | Status: DC
  Administered 2016-08-05 – 2016-08-08 (×9): 3.375 g via INTRAVENOUS
  Filled 2016-08-05 (×14): qty 50

## 2016-08-05 MED ORDER — DIATRIZOATE MEGLUMINE & SODIUM 66-10 % PO SOLN
15.0000 mL | Freq: Once | ORAL | Status: AC
  Administered 2016-08-05: 15 mL via ORAL

## 2016-08-05 MED ORDER — KETOROLAC TROMETHAMINE 30 MG/ML IJ SOLN: 15.0000 mg | Freq: Four times a day (QID) | INTRAMUSCULAR | Status: DC | PRN

## 2016-08-05 MED ORDER — ONDANSETRON HCL 4 MG/2ML IJ SOLN
4.0000 mg | Freq: Four times a day (QID) | INTRAMUSCULAR | Status: DC | PRN
  Administered 2016-08-06 – 2016-08-07 (×2): 4 mg via INTRAVENOUS
  Filled 2016-08-05 (×2): qty 2

## 2016-08-05 NOTE — ED Notes (Signed)
Pt to room4, placed on cardiac monitor, cont pox, call bell at right side, family at bedside. Pt moaning loudly in pain, in obvious distress. Pt states "please help me, give me something for this pain, it's killing me". Dr. Joni Fears notified of pt's distress level and chief complaint. md notified did order fentanly from protocol. md states to proceed with fentanyl doseage due to pain level and he will assess. Call placed to primary rn to notify of pt's condition, with no answer.

## 2016-08-05 NOTE — Consult Note (Signed)
Cardiology Consultation Note  Patient ID: [name] [name], MRN: [id number], DOB/AGE: 1981/11/26 35 y.o. Admit date: 08/05/2016   Date of Consult: 08/05/2016 Primary Physician: Kathrine Haddock, NP Primary Cardiologist: Dr. Fletcher Anon, MD Requesting Physician: Dr. Adonis Huguenin, MD  Chief Complaint: Abdominal pain Reason for Consult: Cardiac preoperative evaluation   HPI: 35 y.o. female with h/o CAD s/p NSTEMI s/p PCI/DES to mid LCx and proximal OM1 in 03/4033, chronic systolic CHF with subsequent normalization of her EF by repeat cardiac cath in 04/2015, moderate MR, DM2 since 2002, renal insufficiency, hypertension, hypotension, prior tobacco abuse, iron deficiency anemia, obesity, statin intolerance, and anxiety who presented to [facility] on 8/28 with abdominal pain and was found to have acute appendicitis without perforation.   She presented to [facility] in April of 2016 with NSTEMI. Echocardiogram showed an ejection fraction of 30% with severe inferior, inferolateral and lateral wall hypokinesis with moderate mitral regurgitation. Cardiac catheterization showed occluded mid left circumflex with significant disease affecting the proximal OM1. There was mild disease affecting the LAD and minor irregularities in the RCA. Ejection fraction was 35%. She underwent successful angioplasty and drug-eluting stent placement to the mid LCX and proximal OM. She underwent a repeat cardiac catheterization in May 2016 which showed patent stents in the left circumflex and OM1. Ejection fraction was improved to 50-55%. She was hospitalized at [facility] in March, 2017 for chest pain thought to be due to panic attacks. She ruled out for myocardial infarction. She underwent a nuclear stress test showed evidence of prior left circumflex infarct without ischemia with normal ejection fraction. She has had issues with hypotension that required discontinuation of losartan. She was most recently seen in the ED in mid August 2017 for chest pain. Cardiac  enzymes were negative. At her most recent follow up with Dr. Fletcher Anon, MD on 08/01/2016 she reported no recurrent chest pain and was overall feeling better.    She has continued to have intermittent, nonexertional chest pain since her cardiac cath in 03/2015, leading to repeat cardiac cath in 04/2015 that showed patent stents o/w no changes and nuclear stress testing in 02/2016. This pain has been unchanged from the beginning and not felt to be cardiac. She notes easy fatigue with climbing the stairs to her apartment leading to her taking a break before she reaches her 3rd floor apartment. She never has any chest pain with this exertion.   She last took Brilinta on the morning of 08/01/2016 as she ran out of medication and has yet to pick this up from the pharmacy. She has continued to take aspirin 81 mg daily.   Upon the patient's arrival to [facility] they were found to have WBC 17.0, hgb 10.6, troponin negative, SCr 0.99, BUN 26, K+ 4.6 (possible hemolysis), albumin 3.0, AST 82, ALT 90, alk phos 220, lipase 25, UA 3+ hgb, 6-30 rbc/hpf, negative hcg, UDS negative. ECG showed NSR, 88 bpm, no acute st/t changes, CTA chest negative for PE or acute aortic pathology, mild coronary artery calcifications. CT abdomen/pelvis with acute appendicitis without perforation. RUQ ultrasound negative. She was started on Zosyn and treated for pain control. Surgery is following for appendectomy at their discretion. Currently without chest pain.   Past Medical History:  Diagnosis Date  . CAD (coronary artery disease)    a. NSTEMI 03/2015: mLCx 100% s/p PCI/DES, RI 90% s/p PCI/DES, EF 35%; b. 04/2015 Relook Cath: LM nl, LAD 40p, RI patent stent, LCX patent stent, RCA nl, EF 55-65%.  . Chronic systolic CHF (congestive heart  failure) ([place])    a. echo 03/2015: EF 30-35%, mild concentric LVH, severe HK of inf, inflat, & lat walls, mod MR, mild TR;  b. 04/2015 LV Gram: EF 55-65%.  . DM2 (diabetes mellitus, type 2) ([place])    a. since 2002  .  Heavy menses    a. H/O IUD - expired in 2014 - remains in place.  . Hyperlipidemia   . Hypertension   . Hypotension    a. resulting in discontinuation of losartan and reduction in coreg dose.  . Iron deficiency anemia   . Ischemic cardiomyopathy    a. 03/2015 EF 30-35% post NSTEMI;  b. 04/2015 EF 55-65% on LV gram.  . Kidney disease   . MI (myocardial infarction) ([place]) 03/29/2015  . Obesity   . Renal insufficiency   . Tobacco abuse    a. quit 03/2015.  Marland Kitchen Uterine fibroid       Most Recent Cardiac Studies: Cardiac cath 05/05/2015: Coronary Findings   Dominance: Right  Left Main  The vessel , is normal in caliber is angiographically normal.  Left Anterior Descending  Prox LAD lesion, 40% stenosed. discrete .  First Diagonal Branch  The vessel is small in size.  Second Diagonal Branch  The vessel is small in size.  Third Diagonal Branch  The vessel is small in size.  Ramus Intermedius  Ramus lesion, 0% stenosed. Previously placed Ramus drug eluting stent is patent.  Left Circumflex  Prox Cx to Dist Cx lesion, 0% stenosed. Previously placed Prox Cx to Dist Cx drug eluting stent is patent.  Right Coronary Artery  The vessel , is normal in caliber is angiographically normal.  Wall Motion              Left Heart   Left Ventricle The left ventricular size is normal. The left ventricular systolic function is normal. The left ventricular ejection fraction is 55-65% by visual estimate. There are no wall motion abnormalities in the left ventricle.    Mitral Valve There is no mitral valve regurgitation.    Coronary Diagrams   Diagnostic Diagram         Surgical History:  Past Surgical History:  Procedure Laterality Date  . CARDIAC CATHETERIZATION  4/16   x2 stent [facility]  . CARDIAC CATHETERIZATION N/A 05/05/2015   Procedure: Left Heart Cath and Coronary Angiography;  Surgeon: Peter M Martinique, MD;  Location: [place] [facility];  Service: Cardiovascular;  Laterality: N/A;  .  COLONOSCOPY WITH PROPOFOL N/A 07/31/2016   Procedure: COLONOSCOPY WITH PROPOFOL;  Surgeon: Lollie Sails, MD;  Location: [facility] ENDOSCOPY;  Service: Endoscopy;  Laterality: N/A;  . ESOPHAGOGASTRODUODENOSCOPY (EGD) WITH PROPOFOL N/A 07/31/2016   Procedure: ESOPHAGOGASTRODUODENOSCOPY (EGD) WITH PROPOFOL;  Surgeon: Lollie Sails, MD;  Location: [facility] ENDOSCOPY;  Service: Endoscopy;  Laterality: N/A;  . MOUTH SURGERY       Home Meds: Prior to Admission medications   Medication Sig Start Date End Date Taking? Authorizing Provider  amoxicillin-clavulanate (AUGMENTIN) 875-125 MG tablet Take 1 tablet by mouth 2 (two) times daily. 07/30/16  Yes Kathrine Haddock, NP  aspirin EC 81 MG tablet Take 81 mg by mouth daily.   Yes Historical Provider, MD  benzonatate (TESSALON) 200 MG capsule Take 1 capsule (200 mg total) by mouth 2 (two) times daily as needed for cough. 07/30/16  Yes Kathrine Haddock, NP  blood glucose meter kit and supplies KIT Dispense based on patient and insurance preference. Use up to four times daily as directed.  E11.22. 03/27/16  Yes Kathrine Haddock, NP  dicyclomine (BENTYL) 20 MG tablet Take 20 mg by mouth 4 (four) times daily as needed for spasms.  06/28/16 06/28/17 Yes Historical Provider, MD  etonogestrel (NEXPLANON) 68 MG IMPL implant 1 each by Subdermal route once.   Yes Historical Provider, MD  ferrous sulfate 325 (65 FE) MG tablet Take 1 tablet (325 mg total) by mouth 3 (three) times daily with meals. Patient taking differently: Take 325 mg by mouth daily with breakfast.  03/06/16  Yes Kathrine Haddock, NP  glimepiride (AMARYL) 4 MG tablet Take 1 tablet (4 mg total) by mouth daily. 03/25/16  Yes Kathrine Haddock, NP  glucose blood (ACCU-CHEK ACTIVE STRIPS) test strip Use as instructed 03/25/16  Yes Kathrine Haddock, NP  Insulin Syringe-Needle U-100 (INSULIN SYRINGE 1CC/31GX5/16") 31G X 5/16" 1 ML MISC 1 Units by Does not apply route at bedtime. 04/26/16  Yes Kathrine Haddock, NP  loperamide (IMODIUM) 2 MG  capsule Take 2 mg by mouth as needed for diarrhea or loose stools.   Yes Historical Provider, MD  meclizine (ANTIVERT) 32 MG tablet Take 1 tablet (32 mg total) by mouth 3 (three) times daily as needed. 06/21/16  Yes Kathrine Haddock, NP  Melatonin 5 MG TABS Take 5 mg by mouth daily.   Yes Historical Provider, MD  metoprolol tartrate (LOPRESSOR) 25 MG tablet Take 1 tablet (25 mg total) by mouth 2 (two) times daily. 06/27/16  Yes Wellington Hampshire, MD  nitroGLYCERIN (NITROSTAT) 0.4 MG SL tablet Place 1 tablet (0.4 mg total) under the tongue every 5 (five) minutes as needed for chest pain. Reported on 02/20/2016 03/11/16  Yes Wellington Hampshire, MD  pantoprazole (PROTONIX) 40 MG tablet Take 1 tablet (40 mg total) by mouth daily at 6 (six) AM. 03/25/16  Yes Kathrine Haddock, NP  sitaGLIPtin (JANUVIA) 100 MG tablet Take 1 tablet (100 mg total) by mouth daily. 07/01/16  Yes Kathrine Haddock, NP  ticagrelor (BRILINTA) 90 MG TABS tablet Take 1 tablet (90 mg total) by mouth 2 (two) times daily. 08/01/16  Yes Wellington Hampshire, MD  venlafaxine XR (EFFEXOR-XR) 37.5 MG 24 hr capsule Take 37.5 mg by mouth daily with breakfast.   Yes Historical Provider, MD    Inpatient Medications:  . famotidine (PEPCID) IV  20 mg Intravenous Q12H  . heparin  5,000 Units Subcutaneous Q8H  . insulin aspart  0-15 Units Subcutaneous Q4H  . metoprolol tartrate  25 mg Oral BID  . piperacillin-tazobactam (ZOSYN)  IV  3.375 g Intravenous Q8H   . lactated ringers 125 mL/hr at 08/05/16 1031    Allergies:  Allergies  Allergen Reactions  . Lisinopril Anaphylaxis, Itching and Swelling  . Rosemary Oil Anaphylaxis  . Shellfish Allergy Itching and Swelling  . Metformin And Related Diarrhea, Nausea And Vomiting and Rash    Social History   Social History  . Marital status: Single    Spouse name: N/A  . Number of children: N/A  . Years of education: N/A   Occupational History  . Not on file.   Social History Main Topics  . Smoking status:  Former Smoker    Years: 15.00    Quit date: 03/10/2015  . Smokeless tobacco: Never Used  . Alcohol use No  . Drug use: No  . Sexual activity: Not on file   Other Topics Concern  . Not on file   Social History Narrative   Lives locally with daughter and fiance.     Family History  Problem Relation Age of Onset  . Diabetes Father   . Cancer Maternal Grandmother     lung  . Cancer Maternal Grandfather     prostate  . Diabetes Paternal Grandfather   . Cancer - Cervical Maternal Aunt      Review of Systems: Review of Systems  Constitutional: Positive for malaise/fatigue. Negative for chills, diaphoresis, fever and weight loss.  HENT: Negative for congestion.   Eyes: Negative for discharge and redness.  Respiratory: Negative for cough, sputum production, shortness of breath and wheezing.   Cardiovascular: Positive for chest pain. Negative for palpitations, orthopnea, claudication, leg swelling and PND.  Gastrointestinal: Positive for abdominal pain and nausea. Negative for heartburn and vomiting.  Musculoskeletal: Negative for falls and myalgias.  Skin: Negative for rash.  Neurological: Positive for weakness. Negative for dizziness, tingling, tremors, sensory change, speech change, focal weakness and loss of consciousness.  Endo/Heme/Allergies: Does not bruise/bleed easily.  Psychiatric/Behavioral: Negative for substance abuse. The patient is nervous/anxious.   All other systems reviewed and are negative.   Labs:  Recent Labs  08/05/16 0232  TROPONINI <0.03   Lab Results  Component Value Date   WBC 17.0 (H) 08/05/2016   HGB 10.6 (L) 08/05/2016   HCT 31.4 (L) 08/05/2016   MCV 80.2 08/05/2016   PLT 315 08/05/2016     Recent Labs Lab 08/05/16 0232  NA 135  K 4.6  CL 112*  CO2 19*  BUN 26*  CREATININE 0.99  CALCIUM 8.4*  PROT 6.9  BILITOT 0.8  ALKPHOS 220*  ALT 90*  AST 82*  GLUCOSE 175*   Lab Results  Component Value Date   CHOL 154 03/06/2016   HDL  39 (L) 03/06/2016   LDLCALC 93 03/06/2016   TRIG 111 03/06/2016   No results found for: DDIMER  Radiology/Studies:   Ct Angio Chest Pe W And/or Wo Contrast  Result Date: 08/05/2016 IMPRESSION: CTA CHEST:  No acute pulmonary embolism or acute pulmonary process. Mild cardiomegaly and mild coronary artery calcifications. CT ABDOMEN AND PELVIS:  Acute appendicitis without perforation. Mild calcific atherosclerosis. Acute findings discussed with and reconfirmed by Dr.PHILLIP STAFFORD on 08/05/2016 at 5:24 am. Electronically Signed   By: Awilda Metro M.D.   On: 08/05/2016 05:27   Ct Abdomen Pelvis W Contrast  Result Date: 08/05/2016 IMPRESSION: CTA CHEST:  No acute pulmonary embolism or acute pulmonary process. Mild cardiomegaly and mild coronary artery calcifications. CT ABDOMEN AND PELVIS:  Acute appendicitis without perforation. Mild calcific atherosclerosis. Acute findings discussed with and reconfirmed by Dr.PHILLIP STAFFORD on 08/05/2016 at 5:24 am. Electronically Signed   By: Awilda Metro M.D.   On: 08/05/2016 05:27   US Abdomen Limited Ruq  Result Date: 08/05/2016 IMPRESSION: Negative RIGHT upper quadrant ultrasound. Electronically Signed   By: Awilda Metro M.D.   On: 08/05/2016 04:02    EKG: Interpreted by me showed: NSR, 88 bpm, no acute st/t changes Telemetry: Interpreted by me showed: NSR, 90's bpm  Weights: [employer]   08/05/16 0223  Weight: 208 lb (94.3 kg)     Physical Exam: Blood pressure 122/73, pulse 81, temperature 98.5 F (36.9 C), temperature source Oral, resp. rate 20, height 5\' 4"  (1.626 m), weight 208 lb (94.3 kg), last menstrual period 06/28/2016, SpO2 100 %. Body mass index is 35.7 kg/m. General: Well developed, well nourished, in no acute distress. Head: Normocephalic, atraumatic, sclera non-icteric, no xanthomas, nares are without discharge.  Neck: Negative for carotid bruits. JVD not elevated. Lungs: Clear bilaterally  to auscultation  without wheezes, rales, or rhonchi. Breathing is unlabored. Heart: RRR with S1 S2. No murmurs, rubs, or gallops appreciated. Abdomen: Obese, soft, tender RL/UQ, non-distended with normoactive bowel sounds. No hepatomegaly. No rebound/guarding. No obvious abdominal masses. Msk:  Strength and tone appear normal for age. Extremities: No clubbing or cyanosis. Trace pre-tibial edema right LE. Distal pedal pulses are 2+ and equal bilaterally. Neuro: Alert and oriented X 3. No facial asymmetry. No focal deficit. Moves all extremities spontaneously. Psych:  Responds to questions appropriately with a normal affect.    Assessment and Plan:  Principal Problem:   Appendicitis Active Problems:   CAD (coronary artery disease)   Ischemic cardiomyopathy   Diabetes type 2, controlled (HCC)   Obesity   Chronic systolic CHF (congestive heart failure) (HCC)   Hyperlipidemia   Iron deficiency anemia    1. Preoperative cardiac evaluation: No symptoms concerning for ischemia at this time. She had one episode of nonexertional chest pain on 8/27 that resolved in 1-2 minutes. Since her NSTEMI she has continued to have chest pain with associated anxiety. Repeat cardiac cath 04/2015 showed patent stents with no changes compared to prior cath. She has also recently undergone nuclear stress testing in 02/2016 that was low-risk. Cleared for non-cardiac surgery at at low-to-moderate risk. Her Brilinta has been held upon admission. She last took Brilinta on the morning of 08/01/2016. She has continued to take aspirin 81 mg daily. Would continue aspirin 81 mg daily in the perioperative time-frame. Defer restarting Brilinta to interventional cardiology and general surgery.   2. Chronic systolic CHF: EF has returned to normal by cardiac cath 04/2015. She does not appear to be volume overloaded at this time. She has had issues with orthostatic hypotension leading to cessation of losartan and decreased Coreg dose (now on Lopressor  as an outpatient). Continue to monitor BP and continue HF medications as BP allows. Limit perioperative fluids.   3. CAD s/p PCI as above: No symptoms concerning for angina. Continue aspirin as above. Defer Brilinta as above. Continue medical management. No plans for inpatient ischemic evaluation at this time.   4. Appendicitis: Per general surgery.   5. Iron deficiency anemia: Stable.   6. HLD: Statin intolerant.   7. Renal insufficiency: Improved by admission labs.   8. Obesity/fatigue/SOB: May benefit from pulmonary/cardiac rehab as an outpatient.    Signed, Christell Faith, PA-C [place] Pager: [phone] 08/05/2016, 1:12 PM

## 2016-08-05 NOTE — ED Notes (Signed)
Pt reports no improvement in pain level after fentanyl administration, call bell at side, pt remains tachypnic and continues to moan.

## 2016-08-05 NOTE — ED Notes (Signed)
Pt somnolent when RN arrived to room. Pt answered questions with slur. Pt reports she has only had tylenol for pain before coming to ED. Pt's O2 sat dropped to 83%. Pt placed on 2L [place]. PT's RR at 8-12 bpm. MD Joni Fears informed

## 2016-08-05 NOTE — ED Provider Notes (Signed)
[facility] Emergency Department Provider Note  ____________________________________________  Time seen: Approximately 3:02 AM  I have reviewed the triage vital signs and the nursing notes.   HISTORY  Chief Complaint Abdominal Pain and Emesis    HPI [name] [name] is a 35 y.o. female who complains of severe abdominal pain that startedabout 6 hours ago, gradual onset, worsening and severe. Associated with chills and vomiting. No diarrhea or constipation. No chest pain or shortness of breath. No aggravating or alleviating factors.     Past Medical History:  Diagnosis Date  . CAD (coronary artery disease)    a. NSTEMI 03/2015: mLCx 100% s/p PCI/DES, RI 90% s/p PCI/DES, EF 35%; b. 04/2015 Relook Cath: LM nl, LAD 40p, RI patent stent, LCX patent stent, RCA nl, EF 55-65%.  . Chronic systolic CHF (congestive heart failure) ([place])    a. echo 03/2015: EF 30-35%, mild concentric LVH, severe HK of inf, inflat, & lat walls, mod MR, mild TR;  b. 04/2015 LV Gram: EF 55-65%.  . DM2 (diabetes mellitus, type 2) ([place])    a. since 2002  . Heavy menses    a. H/O IUD - expired in 2014 - remains in place.  . Hyperlipidemia   . Hypertension   . Hypotension    a. resulting in discontinuation of losartan and reduction in coreg dose.  . Iron deficiency anemia   . Ischemic cardiomyopathy    a. 03/2015 EF 30-35% post NSTEMI;  b. 04/2015 EF 55-65% on LV gram.  . Kidney disease   . MI (myocardial infarction) ([place]) 03/29/2015  . Obesity   . Renal insufficiency   . Tobacco abuse    a. quit 03/2015.  Marland Kitchen Uterine fibroid      Patient Active Problem List   Diagnosis Date Noted  . Orthostatic hypotension 06/21/2016  . Vertigo 06/21/2016  . Diarrhea 06/21/2016  . Vomiting 06/21/2016  . Elevated liver function tests 06/05/2016  . Uncontrolled type 2 diabetes mellitus, with long-term current use of insulin ([place]) 06/03/2016  . Anemia 04/03/2016  . Chronic diastolic heart failure ([place])  03/19/2016  . Tachycardia 03/19/2016  . IBS (irritable bowel syndrome) 12/15/2015  . Proteinuria 08/30/2015  . Coronary artery disease involving native coronary artery of native heart with angina pectoris with documented spasm ([place])   . Angina pectoris ([place])   . Iron deficiency anemia   . Heavy menses   . SOB (shortness of breath) 08/21/2015  . Chest pain 05/05/2015  . CAD in native artery 05/05/2015  . Hyperlipidemia 04/13/2015  . Ischemic cardiomyopathy   . Diabetes type 2, controlled ([place])   . Obesity      Past Surgical History:  Procedure Laterality Date  . CARDIAC CATHETERIZATION  4/16   x2 stent [facility]  . CARDIAC CATHETERIZATION N/A 05/05/2015   Procedure: Left Heart Cath and Coronary Angiography;  Surgeon: Peter M Martinique, MD;  Location: [facility];  Service: Cardiovascular;  Laterality: N/A;  . COLONOSCOPY WITH PROPOFOL N/A 07/31/2016   Procedure: COLONOSCOPY WITH PROPOFOL;  Surgeon: Lollie Sails, MD;  Location: [facility] ENDOSCOPY;  Service: Endoscopy;  Laterality: N/A;  . ESOPHAGOGASTRODUODENOSCOPY (EGD) WITH PROPOFOL N/A 07/31/2016   Procedure: ESOPHAGOGASTRODUODENOSCOPY (EGD) WITH PROPOFOL;  Surgeon: Lollie Sails, MD;  Location: [facility] ENDOSCOPY;  Service: Endoscopy;  Laterality: N/A;  . MOUTH SURGERY       Prior to Admission medications   Medication Sig Start Date End Date Taking? Authorizing Provider  amoxicillin-clavulanate (AUGMENTIN) 875-125 MG tablet Take 1  tablet by mouth 2 (two) times daily. 07/30/16  Yes Kathrine Haddock, NP  aspirin EC 81 MG tablet Take 81 mg by mouth daily.   Yes Historical Provider, MD  benzonatate (TESSALON) 200 MG capsule Take 1 capsule (200 mg total) by mouth 2 (two) times daily as needed for cough. 07/30/16  Yes Kathrine Haddock, NP  blood glucose meter kit and supplies KIT Dispense based on patient and insurance preference. Use up to four times daily as directed. E11.22. 03/27/16  Yes Kathrine Haddock, NP  dicyclomine (BENTYL) 20 MG tablet  Take 20 mg by mouth 4 (four) times daily as needed for spasms.  06/28/16 06/28/17 Yes Historical Provider, MD  etonogestrel (NEXPLANON) 68 MG IMPL implant 1 each by Subdermal route once.   Yes Historical Provider, MD  ferrous sulfate 325 (65 FE) MG tablet Take 1 tablet (325 mg total) by mouth 3 (three) times daily with meals. Patient taking differently: Take 325 mg by mouth daily with breakfast.  03/06/16  Yes Kathrine Haddock, NP  glimepiride (AMARYL) 4 MG tablet Take 1 tablet (4 mg total) by mouth daily. 03/25/16  Yes Kathrine Haddock, NP  glucose blood (ACCU-CHEK ACTIVE STRIPS) test strip Use as instructed 03/25/16  Yes Kathrine Haddock, NP  Insulin Syringe-Needle U-100 (INSULIN SYRINGE 1CC/31GX5/16") 31G X 5/16" 1 ML MISC 1 Units by Does not apply route at bedtime. 04/26/16  Yes Kathrine Haddock, NP  loperamide (IMODIUM) 2 MG capsule Take 2 mg by mouth as needed for diarrhea or loose stools.   Yes Historical Provider, MD  meclizine (ANTIVERT) 32 MG tablet Take 1 tablet (32 mg total) by mouth 3 (three) times daily as needed. 06/21/16  Yes Kathrine Haddock, NP  Melatonin 5 MG TABS Take 5 mg by mouth daily.   Yes Historical Provider, MD  metoprolol tartrate (LOPRESSOR) 25 MG tablet Take 1 tablet (25 mg total) by mouth 2 (two) times daily. 06/27/16  Yes Wellington Hampshire, MD  nitroGLYCERIN (NITROSTAT) 0.4 MG SL tablet Place 1 tablet (0.4 mg total) under the tongue every 5 (five) minutes as needed for chest pain. Reported on 02/20/2016 03/11/16  Yes Wellington Hampshire, MD  pantoprazole (PROTONIX) 40 MG tablet Take 1 tablet (40 mg total) by mouth daily at 6 (six) AM. 03/25/16  Yes Kathrine Haddock, NP  sitaGLIPtin (JANUVIA) 100 MG tablet Take 1 tablet (100 mg total) by mouth daily. 07/01/16  Yes Kathrine Haddock, NP  ticagrelor (BRILINTA) 90 MG TABS tablet Take 1 tablet (90 mg total) by mouth 2 (two) times daily. 08/01/16  Yes Wellington Hampshire, MD  venlafaxine XR (EFFEXOR-XR) 37.5 MG 24 hr capsule Take 37.5 mg by mouth daily with breakfast.    Yes Historical Provider, MD     Allergies Lisinopril; Rosemary oil; Shellfish allergy; and Metformin and related   Family History  Problem Relation Age of Onset  . Diabetes Father   . Cancer Maternal Grandmother     lung  . Cancer Maternal Grandfather     prostate  . Diabetes Paternal Grandfather   . Cancer - Cervical Maternal Aunt     Social History Social History  Substance Use Topics  . Smoking status: Former Smoker    Years: 15.00    Quit date: 03/10/2015  . Smokeless tobacco: Never Used  . Alcohol use No    Review of Systems  Constitutional:   No fever Or positive chills.  ENT:   No sore throat. No rhinorrhea. Cardiovascular:   No chest pain. Respiratory:  No dyspnea or cough. Gastrointestinal:   Positive upper abdominal pain with vomiting.  Genitourinary:   Negative for dysuria or difficulty urinating. Musculoskeletal:   Negative for focal pain or swelling Neurological:   Negative for headaches 10-point ROS otherwise negative.  ____________________________________________   PHYSICAL EXAM:  VITAL SIGNS: ED Triage Vitals  Enc Vitals Group     BP 08/05/16 0214 132/79     Pulse Rate 08/05/16 0214 89     Resp 08/05/16 0214 (!) 22     Temp 08/05/16 0214 97.5 F (36.4 C)     Temp Source 08/05/16 0214 Oral     SpO2 08/05/16 0214 100 %     Weight 08/05/16 0223 208 lb (94.3 kg)     Height 08/05/16 0223 '5\' 4"'$  (1.626 m)     Head Circumference --      Peak Flow --      Pain Score 08/05/16 0223 10     Pain Loc --      Pain Edu? --      Excl. in [place]? --     Vital signs reviewed, nursing assessments reviewed.   Constitutional:   Alert and oriented. Ill-appearing Eyes:   No scleral icterus. No conjunctival pallor. PERRL. EOMI.  No nystagmus. ENT   Head:   Normocephalic and atraumatic.   Nose:   No congestion/rhinnorhea. No septal hematoma   Mouth/Throat:   MMM, no pharyngeal erythema. No peritonsillar mass.    Neck:   No stridor. No SubQ  emphysema. No meningismus. Hematological/Lymphatic/Immunilogical:   No cervical lymphadenopathy. Cardiovascular:   RRR. Symmetric bilateral radial and DP pulses.  No murmurs.  Respiratory:   Normal respiratory effort without tachypnea nor retractions. Breath sounds are clear and equal bilaterally. No wheezes/rales/rhonchi. Gastrointestinal:   Soft with diffuse right-sided tenderness. Non distended. There is no CVA tenderness.  No rebound, rigidity, or guarding. Genitourinary:   deferred Musculoskeletal:   Nontender with normal range of motion in all extremities. No joint effusions.  No lower extremity tenderness.  No edema. Neurologic:   Normal speech and language.  CN 2-10 normal. Motor grossly intact. No gross focal neurologic deficits are appreciated.  Skin:    Skin is warm, dry and intact. No rash noted.  No petechiae, purpura, or bullae.  ____________________________________________    LABS (pertinent positives/negatives) (all labs ordered are listed, but only abnormal results are displayed) Labs Reviewed  CBC WITH DIFFERENTIAL/PLATELET - Abnormal; Notable for the following:       Result Value   WBC 17.0 (*)    Hemoglobin 10.6 (*)    HCT 31.4 (*)    RDW 14.6 (*)    Neutro Abs 14.5 (*)    All other components within normal limits  COMPREHENSIVE METABOLIC PANEL - Abnormal; Notable for the following:    Chloride 112 (*)    CO2 19 (*)    Glucose, Bld 175 (*)    BUN 26 (*)    Calcium 8.4 (*)    Albumin 3.0 (*)    AST 82 (*)    ALT 90 (*)    Alkaline Phosphatase 220 (*)    Anion gap 4 (*)    All other components within normal limits  TROPONIN I  LIPASE, BLOOD  URINALYSIS COMPLETEWITH MICROSCOPIC ([facility] ONLY)  PREGNANCY, URINE  URINE DRUG SCREEN, QUALITATIVE ([facility] ONLY)   ____________________________________________   EKG  Interpreted by me  Normal sinus rhythm rate of 88, normal axis and intervals. Poor R-wave progression in anterior precordial leads.  Normal ST  segment. Isolated T-wave inversion in V2 which is nonspecific. There is T-wave inversion in lead 3 which is also in isolated and nonspecific. There is a S1Q3T3 pattern.    ____________________________________________    RADIOLOGY  Ultrasound right upper quadrant unremarkable CT angiogram chest unremarkable CT abdomen and pelvis reveals acute appendicitis. Results discussed with radiologist.  ____________________________________________   PROCEDURES Procedures  ____________________________________________   INITIAL IMPRESSION / ASSESSMENT AND PLAN / ED COURSE  Pertinent labs & imaging results that were available during my care of the patient were reviewed by me and considered in my medical decision making (see chart for details).  Patient presents with severe abdominal pain and vomiting. Check labs. Exam concerning for cholecystitis. We'll get an ultrasound. She has had ultrasounds in the past and is planned for further biliary workup. After 50 g of fentanyl the patient became somnolent and hypoxemia take, so we will keep her on nasal cannula oxygen as a precaution, monitor, IV Toradol for pain control. Not requiring Narcan at this time. There is a great deal clinical uncertainty with her presentation and she may require CT imaging as well after ultrasound.     Clinical Course  Value Comment By Time  US Abdomen Limited RUQ Ultrasound negative. Has a leukocytosis. With severe symptoms and abdominal tenderness, we'll proceed with CT scan. Also get a CT angiogram of the chest to evaluate for PE due to EKG pattern. Carrie Mew, MD 08/28 0410    ----------------------------------------- 5:57 AM on 08/05/2016 -----------------------------------------  CT reveals appendicitis. Case discussed with surgery who evaluated the patient in the ED and will admit. ____________________________________________   FINAL CLINICAL IMPRESSION(S) / ED DIAGNOSES  Final diagnoses:  Acute  appendicitis with localized peritonitis  Generalized abdominal pain       Portions of this note were generated with dragon dictation software. Dictation errors may occur despite best attempts at proofreading.    Carrie Mew, MD 08/05/16 [phone]

## 2016-08-05 NOTE — H&P (Signed)
Reason for Consult:  Chief Complaint  Patient presents with  . Abdominal Pain  . Emesis   Referring Physician: Dr. Earline Mayotte [name] is an 35 y.o. female.  HPI: [name] is a 35 year old African-American female admitted to surgical service for acute appendicitis. Patient has past medical history of coronary artery disease status post non-STEMI status post PCI, chronic systolic congestive heart failure, chronic iron deficiency anemia, essential hypertension and chronic kidney disease. Hospitalist team is consulted for medical management. During my examination patient is resting comfortably and cleared for surgery by cardiology. Patient's right lower quadrant abdominal pain is well controlled with the current pain regimen. Reporting nausea but no vomiting.  Past Medical History:  Diagnosis Date  . CAD (coronary artery disease)    a. NSTEMI 03/2015: mLCx 100% s/p PCI/DES, RI 90% s/p PCI/DES, EF 35%; b. 04/2015 Relook Cath: LM nl, LAD 40p, RI patent stent, LCX patent stent, RCA nl, EF 55-65%.  . Chronic systolic CHF (congestive heart failure) ([place])    a. echo 03/2015: EF 30-35%, mild concentric LVH, severe HK of inf, inflat, & lat walls, mod MR, mild TR;  b. 04/2015 LV Gram: EF 55-65%.  . DM2 (diabetes mellitus, type 2) ([place])    a. since 2002  . Heavy menses    a. H/O IUD - expired in 2014 - remains in place.  . Hyperlipidemia   . Hypertension   . Hypotension    a. resulting in discontinuation of losartan and reduction in coreg dose.  . Iron deficiency anemia   . Ischemic cardiomyopathy    a. 03/2015 EF 30-35% post NSTEMI;  b. 04/2015 EF 55-65% on LV gram.  . Kidney disease   . MI (myocardial infarction) ([place]) 03/29/2015  . Obesity   . Renal insufficiency   . Tobacco abuse    a. quit 03/2015.  Marland Kitchen Uterine fibroid     Past Surgical History:  Procedure Laterality Date  . CARDIAC CATHETERIZATION  4/16   x2 stent [facility]  . CARDIAC CATHETERIZATION N/A 05/05/2015   Procedure: Left Heart  Cath and Coronary Angiography;  Surgeon: [name] M Martinique, MD;  Location: [facility];  Service: Cardiovascular;  Laterality: N/A;  . COLONOSCOPY WITH PROPOFOL N/A 07/31/2016   Procedure: COLONOSCOPY WITH PROPOFOL;  Surgeon: [name] Sails, MD;  Location: [facility] ENDOSCOPY;  Service: Endoscopy;  Laterality: N/A;  . ESOPHAGOGASTRODUODENOSCOPY (EGD) WITH PROPOFOL N/A 07/31/2016   Procedure: ESOPHAGOGASTRODUODENOSCOPY (EGD) WITH PROPOFOL;  Surgeon: [name] Sails, MD;  Location: [facility] ENDOSCOPY;  Service: Endoscopy;  Laterality: N/A;  . MOUTH SURGERY      Family History  Problem Relation Age of Onset  . Diabetes Father   . Cancer Maternal Grandmother     lung  . Cancer Maternal Grandfather     prostate  . Diabetes Paternal Grandfather   . Cancer - Cervical Maternal Aunt     Social History:  reports that she quit smoking about 16 months ago. She quit after 15.00 years of use. She has never used smokeless tobacco. She reports that she does not drink alcohol or use drugs.  Allergies:  Allergies  Allergen Reactions  . Lisinopril Anaphylaxis, Itching and Swelling  . Rosemary Oil Anaphylaxis  . Shellfish Allergy Itching and Swelling  . Metformin And Related Diarrhea, Nausea And Vomiting and Rash    Medications: I have reviewed the patient's current medications.  Results for orders placed or performed during the hospital encounter of 08/05/16 (from the past 48 hour(s))  CBC  with Differential     Status: Abnormal   Collection Time: 08/05/16  2:32 AM  Result Value Ref Range   WBC 17.0 (H) 3.6 - 11.0 K/uL   RBC 3.91 3.80 - 5.20 MIL/uL   Hemoglobin 10.6 (L) 12.0 - 16.0 g/dL   HCT 31.4 (L) 35.0 - 47.0 %   MCV 80.2 80.0 - 100.0 fL   MCH 27.2 26.0 - 34.0 pg   MCHC 33.9 32.0 - 36.0 g/dL   RDW 14.6 (H) 11.5 - 14.5 %   Platelets 315 150 - 440 K/uL   Neutrophils Relative % 85 %   Neutro Abs 14.5 (H) 1.4 - 6.5 K/uL   Lymphocytes Relative 7 %   Lymphs Abs 1.2 1.0 - 3.6 K/uL   Monocytes  Relative 4 %   Monocytes Absolute 0.7 0.2 - 0.9 K/uL   Eosinophils Relative 3 %   Eosinophils Absolute 0.4 0 - 0.7 K/uL   Basophils Relative 1 %   Basophils Absolute 0.1 0 - 0.1 K/uL  Troponin I     Status: None   Collection Time: 08/05/16  2:32 AM  Result Value Ref Range   Troponin I <0.03 <0.03 ng/mL  Comprehensive metabolic panel     Status: Abnormal   Collection Time: 08/05/16  2:32 AM  Result Value Ref Range   Sodium 135 135 - 145 mmol/L   Potassium 4.6 3.5 - 5.1 mmol/L    Comment: HEMOLYSIS AT THIS LEVEL MAY AFFECT RESULT   Chloride 112 (H) 101 - 111 mmol/L   CO2 19 (L) 22 - 32 mmol/L   Glucose, Bld 175 (H) 65 - 99 mg/dL   BUN 26 (H) 6 - 20 mg/dL   Creatinine, Ser 0.99 0.44 - 1.00 mg/dL   Calcium 8.4 (L) 8.9 - 10.3 mg/dL   Total Protein 6.9 6.5 - 8.1 g/dL   Albumin 3.0 (L) 3.5 - 5.0 g/dL   AST 82 (H) 15 - 41 U/L   ALT 90 (H) 14 - 54 U/L   Alkaline Phosphatase 220 (H) 38 - 126 U/L   Total Bilirubin 0.8 0.3 - 1.2 mg/dL   GFR calc non Af Amer >60 >60 mL/min   GFR calc Af Amer >60 >60 mL/min    Comment: (NOTE) The eGFR has been calculated using the CKD EPI equation. This calculation has not been validated in all clinical situations. eGFR's persistently <60 mL/min signify possible Chronic Kidney Disease.    Anion gap 4 (L) 5 - 15  Lipase, blood     Status: None   Collection Time: 08/05/16  2:32 AM  Result Value Ref Range   Lipase 25 11 - 51 U/L  Urinalysis complete, with microscopic ([facility] only)     Status: Abnormal   Collection Time: 08/05/16  5:44 AM  Result Value Ref Range   Color, Urine YELLOW (A) YELLOW   APPearance CLEAR (A) CLEAR   Glucose, UA 50 (A) NEGATIVE mg/dL   Bilirubin Urine NEGATIVE NEGATIVE   Ketones, ur TRACE (A) NEGATIVE mg/dL   Specific Gravity, Urine 1.032 (H) 1.005 - 1.030   Hgb urine dipstick 3+ (A) NEGATIVE   pH 5.0 5.0 - 8.0   Protein, ur >500 (A) NEGATIVE mg/dL   Nitrite NEGATIVE NEGATIVE   Leukocytes, UA NEGATIVE NEGATIVE   RBC / HPF  6-30 0 - 5 RBC/hpf   WBC, UA 0-5 0 - 5 WBC/hpf   Bacteria, UA NONE SEEN NONE SEEN   Squamous Epithelial / LPF 0-5 (A) NONE SEEN  Pregnancy, urine     Status: None   Collection Time: 08/05/16  5:44 AM  Result Value Ref Range   Preg Test, Ur NEGATIVE NEGATIVE  Urine Drug Screen, Qualitative     Status: None   Collection Time: 08/05/16  5:44 AM  Result Value Ref Range   Tricyclic, Ur Screen NONE DETECTED NONE DETECTED   Amphetamines, Ur Screen NONE DETECTED NONE DETECTED   MDMA (Ecstasy)Ur Screen NONE DETECTED NONE DETECTED   Cocaine Metabolite,Ur [place] NONE DETECTED NONE DETECTED   Opiate, Ur Screen NONE DETECTED NONE DETECTED   Phencyclidine (PCP) Ur S NONE DETECTED NONE DETECTED   Cannabinoid 50 Ng, Ur [place] NONE DETECTED NONE DETECTED   Barbiturates, Ur Screen NONE DETECTED NONE DETECTED   Benzodiazepine, Ur Scrn NONE DETECTED NONE DETECTED   Methadone Scn, Ur NONE DETECTED NONE DETECTED    Comment: (NOTE) 676  Tricyclics, urine               Cutoff 1000 ng/mL 200  Amphetamines, urine             Cutoff 1000 ng/mL 300  MDMA (Ecstasy), urine           Cutoff 500 ng/mL 400  Cocaine Metabolite, urine       Cutoff 300 ng/mL 500  Opiate, urine                   Cutoff 300 ng/mL 600  Phencyclidine (PCP), urine      Cutoff 25 ng/mL 700  Cannabinoid, urine              Cutoff 50 ng/mL 800  Barbiturates, urine             Cutoff 200 ng/mL 900  Benzodiazepine, urine           Cutoff 200 ng/mL 1000 Methadone, urine                Cutoff 300 ng/mL 1100 1200 The urine drug screen provides only a preliminary, unconfirmed 1300 analytical test result and should not be used for non-medical 1400 purposes. Clinical consideration and professional judgment should 1500 be applied to any positive drug screen result due to possible 1600 interfering substances. A more specific alternate chemical method 1700 must be used in order to obtain a confirmed analytical result.  1800 Gas chromato graphy / mass  spectrometry (GC/MS) is the preferred 1900 confirmatory method.   Glucose, capillary     Status: Abnormal   Collection Time: 08/05/16 11:42 AM  Result Value Ref Range   Glucose-Capillary 112 (H) 65 - 99 mg/dL    Ct Angio Chest Pe W And/or Wo Contrast  Result Date: 08/05/2016 CLINICAL DATA:  RIGHT upper quadrant pain, nausea, vomiting and diarrhea for few weeks, worsening for 5 hours. Pending cholecystectomy. History of CHF, diabetes, hypertension. EXAM: CT ANGIOGRAPHY CHEST CT ABDOMEN AND PELVIS WITH CONTRAST TECHNIQUE: Multidetector CT imaging of the chest was performed using the standard protocol during bolus administration of intravenous contrast. Multiplanar CT image reconstructions and MIPs were obtained to evaluate the vascular anatomy. Multidetector CT imaging of the abdomen and pelvis was performed using the standard protocol during bolus administration of intravenous contrast. CONTRAST:  100 cc Isovue 370 COMPARISON:  RIGHT upper quadrant ultrasound August 05, 2016 at 0326 hours FINDINGS: CTA CHEST FINDINGS PULMONARY ARTERY: Adequate contrast opacification of the pulmonary artery's. Main pulmonary artery is not enlarged. No pulmonary arterial filling defects to the level of the subsegmental branches. MEDIASTINUM: Heart  is mildly enlarged, no right heart strain. Mild coronary artery calcification. Thoracic aorta is normal course and caliber, unremarkable. No lymphadenopathy by CT size criteria. Small amount of residual thymic tissue. Mild gaseous and contrast distended esophagus associated with reflux. LUNGS: Tracheobronchial tree is patent, no pneumothorax. No pleural effusions, focal consolidations, pulmonary nodules or masses. SOFT TISSUES AND OSSEOUS STRUCTURES: Included view of the abdomen is unremarkable. Visualized soft tissues and included osseous structures appear normal. CT ABDOMEN and PELVIS FINDINGS SOLID ORGANS: The liver, spleen, gallbladder, pancreas and adrenal glands are  unremarkable. GASTROINTESTINAL TRACT: The stomach, small and large bowel are normal in course and caliber without inflammatory changes. Elongated appendix is enlarged, 14 mm, hyperemic with periappendiceal inflammation. Appendiceal tip at pelvic cul-de-sac. Punctate appendicolith. KIDNEYS/ URINARY TRACT: Kidneys are orthotopic, demonstrating symmetric enhancement. No nephrolithiasis, hydronephrosis or solid renal masses. The unopacified ureters are normal in course and caliber. Delayed imaging through the kidneys demonstrates symmetric prompt contrast excretion within the proximal urinary collecting system. Urinary bladder is partially distended and unremarkable. PERITONEUM/RETROPERITONEUM: Aortoiliac vessels are normal in course and caliber. No lymphadenopathy by CT size criteria. Internal reproductive organs are nonsuspicious 15 mm probable Gartner's cyst. Benign-appearing 3 cm RIGHT adnexal cyst, no indicated follow-up. Small amount of free fluid in the pelvis without focal fluid collection or abscess. No intraperitoneal free air. SOFT TISSUE/OSSEOUS STRUCTURES: Non-suspicious. Review of the MIP images confirms the above findings. IMPRESSION: CTA CHEST:  No acute pulmonary embolism or acute pulmonary process. Mild cardiomegaly and mild coronary artery calcifications. CT ABDOMEN AND PELVIS:  Acute appendicitis without perforation. Mild calcific atherosclerosis. Acute findings discussed with and reconfirmed by Dr.PHILLIP STAFFORD on 08/05/2016 at 5:24 am. Electronically Signed   By: Elon Alas M.D.   On: 08/05/2016 05:27   Ct Abdomen Pelvis W Contrast  Result Date: 08/05/2016 CLINICAL DATA:  RIGHT upper quadrant pain, nausea, vomiting and diarrhea for few weeks, worsening for 5 hours. Pending cholecystectomy. History of CHF, diabetes, hypertension. EXAM: CT ANGIOGRAPHY CHEST CT ABDOMEN AND PELVIS WITH CONTRAST TECHNIQUE: Multidetector CT imaging of the chest was performed using the standard protocol  during bolus administration of intravenous contrast. Multiplanar CT image reconstructions and MIPs were obtained to evaluate the vascular anatomy. Multidetector CT imaging of the abdomen and pelvis was performed using the standard protocol during bolus administration of intravenous contrast. CONTRAST:  100 cc Isovue 370 COMPARISON:  RIGHT upper quadrant ultrasound August 05, 2016 at 0326 hours FINDINGS: CTA CHEST FINDINGS PULMONARY ARTERY: Adequate contrast opacification of the pulmonary artery's. Main pulmonary artery is not enlarged. No pulmonary arterial filling defects to the level of the subsegmental branches. MEDIASTINUM: Heart is mildly enlarged, no right heart strain. Mild coronary artery calcification. Thoracic aorta is normal course and caliber, unremarkable. No lymphadenopathy by CT size criteria. Small amount of residual thymic tissue. Mild gaseous and contrast distended esophagus associated with reflux. LUNGS: Tracheobronchial tree is patent, no pneumothorax. No pleural effusions, focal consolidations, pulmonary nodules or masses. SOFT TISSUES AND OSSEOUS STRUCTURES: Included view of the abdomen is unremarkable. Visualized soft tissues and included osseous structures appear normal. CT ABDOMEN and PELVIS FINDINGS SOLID ORGANS: The liver, spleen, gallbladder, pancreas and adrenal glands are unremarkable. GASTROINTESTINAL TRACT: The stomach, small and large bowel are normal in course and caliber without inflammatory changes. Elongated appendix is enlarged, 14 mm, hyperemic with periappendiceal inflammation. Appendiceal tip at pelvic cul-de-sac. Punctate appendicolith. KIDNEYS/ URINARY TRACT: Kidneys are orthotopic, demonstrating symmetric enhancement. No nephrolithiasis, hydronephrosis or solid renal masses. The unopacified ureters are normal  in course and caliber. Delayed imaging through the kidneys demonstrates symmetric prompt contrast excretion within the proximal urinary collecting system. Urinary  bladder is partially distended and unremarkable. PERITONEUM/RETROPERITONEUM: Aortoiliac vessels are normal in course and caliber. No lymphadenopathy by CT size criteria. Internal reproductive organs are nonsuspicious 15 mm probable Gartner's cyst. Benign-appearing 3 cm RIGHT adnexal cyst, no indicated follow-up. Small amount of free fluid in the pelvis without focal fluid collection or abscess. No intraperitoneal free air. SOFT TISSUE/OSSEOUS STRUCTURES: Non-suspicious. Review of the MIP images confirms the above findings. IMPRESSION: CTA CHEST:  No acute pulmonary embolism or acute pulmonary process. Mild cardiomegaly and mild coronary artery calcifications. CT ABDOMEN AND PELVIS:  Acute appendicitis without perforation. Mild calcific atherosclerosis. Acute findings discussed with and reconfirmed by Dr.PHILLIP STAFFORD on 08/05/2016 at 5:24 am. Electronically Signed   By: Elon Alas M.D.   On: 08/05/2016 05:27   US Abdomen Limited Ruq  Result Date: 08/05/2016 CLINICAL DATA:  RIGHT upper quadrant pain, several weeks of nausea, worsening tonight. Leukocytosis. EXAM: US ABDOMEN LIMITED - RIGHT UPPER QUADRANT COMPARISON:  RIGHT upper quadrant ultrasound June 20, 2016 FINDINGS: Gallbladder: No gallstones or wall thickening visualized. No sonographic Murphy sign noted by sonographer. Common bile duct: Diameter: 3 mm Liver: No focal lesion identified. Within normal limits in parenchymal echogenicity. Hepatopetal portal vein. IMPRESSION: Negative RIGHT upper quadrant ultrasound. Electronically Signed   By: Elon Alas M.D.   On: 08/05/2016 04:02    ROS:  CONSTITUTIONAL: Denies fevers, chills. Denies any fatigue, weakness.  EYES: Denies blurry vision, double vision, eye pain. EARS, NOSE, THROAT: Denies tinnitus, ear pain, hearing loss. RESPIRATORY: Denies cough, wheeze, shortness of breath.  CARDIOVASCULAR: Denies chest pain, palpitations, edema.  GASTROINTESTINAL: Denies nausea, vomiting, diarrhea,  reporting right-sided abdominal pain. Denies bright red blood per rectum. GENITOURINARY: Denies dysuria, hematuria. ENDOCRINE: Denies nocturia or thyroid problems. HEMATOLOGIC AND LYMPHATIC: Denies easy bruising or bleeding. SKIN: Denies rash or lesion. MUSCULOSKELETAL: Denies pain in neck, back, shoulder, knees, hips or arthritic symptoms.  NEUROLOGIC: Denies paralysis, paresthesias.  PSYCHIATRIC: Denies anxiety or depressive symptoms. Blood pressure 122/73, pulse 81, temperature 98.5 F (36.9 C), temperature source Oral, resp. rate 20, height '5\' 4"'$  (1.626 m), weight 94.3 kg (208 lb), last menstrual period 06/28/2016, SpO2 100 %.   PHYSICAL EXAMINATION:  GENERAL: Well-nourished, well-developed , currently in no acute distress.  HEAD: Normocephalic, atraumatic.  EYES: Pupils equal, round, and reactive to light. Extraocular muscles intact. No scleral icterus.  MOUTH: Moist mucosal membranes. Dentition intact. No abscess noted. EARS, NOSE, THROAT: Clear without exudates. No external lesions.  NECK: Supple. No thyromegaly. No nodules. No JVD.  PULMONARY: Clear to auscultation bilaterally without wheezes, rales, or rhonchi. No use of accessory muscles. Good respiratory effort. CHEST: Nontender to palpation.  CARDIOVASCULAR: S1, S2, regular rate and rhythm. No murmurs, rubs, or gallops.  GASTROINTESTINAL: Soft, right lower quadrant is tender, no rebound tenderness nondistended. No masses. Positive bowel sounds. No hepatosplenomegaly. MUSCULOSKELETAL: No swelling, clubbing, edema. Range of motion full in all extremities. NEUROLOGIC: Cranial nerves II-XII intact. No gross focal neurological deficits. Sensation intact. Reflexes intact. SKIN: No ulcerations, lesions, rash, cyanosis. Skin warm, dry. Turgor intact. PSYCHIATRIC: Mood, affect within normal limits. Patient awake, alert, oriented x 3. Insight and judgment intact.   Assessment/Plan: [name] is a 35 year old African-American female  admitted to surgical service for acute appendicitis. Patient has past medical history of coronary artery disease status post non-STEMI status post PCI, chronic systolic congestive heart failure, chronic iron deficiency  anemia, essential hypertension and chronic kidney disease. Hospitalist team is consulted for medical management   #Diabetes mellitus2 Currently patient is nothing by mouth for surgery Hold off on her home medications and provide sliding scale insulin Will consider resuming her home medications glimepiride and Januvia once patient starts tolerating by mouth after surgery  #Essential hypertension Blood pressure is slightly elevated could be from pain Pain management per surgery Continue metoprolol 25 mg by mouth twice a day, will provide IV Lopressor as needed basis  #Chronic kidney disease stage III Currently renal function is stable. Avoid nephrotoxins   # h/o Coronary artery disease s/p non-STEMI, status post PCI and history of chronic systolic congestive heart failure Currently patient is asymptomatic and not fluid overloaded Cardiology has cleared the patient for noncardiac surgery Continue baby aspirin, beta blocker Defer restarting Brilinta to interventional cardiology and general surgery  #Chronic history of iron deficiency anemia. Hb is stable at 10.6 at this time Monitor hemoglobin and hematocrit postoperatively and transfuse as needed  #Acute appendicitis Management per surgery,   DVT prophylaxis with Heparin sq   Thank you Dr. Adonis Huguenin for allowing hospitalist team totmediaclly  Manage the patient r   TOTAL TIME TAKING CARE OF THIS PATIENT: 41  minutes.   Note: This dictation was prepared with Dragon dictation along with smaller phrase technology. Any transcriptional errors that result from this process are unintentional.   '@[facility]'$ @ Pager - [phone] 08/05/2016, 3:17 PM

## 2016-08-05 NOTE — ED Notes (Signed)
Pt arrived via EMS and brought to stat registration in wheelchair; pt c/o low abd pain radiating to both flanks; pt is supposed to have cholecystectomy but has not scheduled yet; pain started Sunday am and has worsened; 172/98; P90, 98%; R24; pt with nausea and "spitting up";

## 2016-08-05 NOTE — H&P (Signed)
Patient ID: [name] [name], female   DOB: [birth year], 35 y.o.   MRN: [id number]  CC: ABDOMINAL PAIN  HPI [name] [name] is a 35 y.o. female who presents to emergency department for evaluation of abdominal pain. Patient states the pain started yesterday in her right lower quadrant and stayed there. The pain is described as sharp and stabbing into her right lower quadrant. It does not currently radiate and has not moved. It progressively worsened and was followed by nausea, vomiting, diarrhea. She presented to the emergency department because she knew something wasn't right. She's never had anything like this before. She does state she had an upper respiratory infection about a week and a half ago but that has resolved. She also had a colonoscopy last week that she states was mostly normal. She denies any fevers, chills, shortness of breath, chest pain, constipation. She does have a significant medical history that includes heart disease, heart failure, diabetes, kidney dysfunction.  HPI  Past Medical History:  Diagnosis Date  . CAD (coronary artery disease)    a. NSTEMI 03/2015: mLCx 100% s/p PCI/DES, RI 90% s/p PCI/DES, EF 35%; b. 04/2015 Relook Cath: LM nl, LAD 40p, RI patent stent, LCX patent stent, RCA nl, EF 55-65%.  . Chronic systolic CHF (congestive heart failure) ([place])    a. echo 03/2015: EF 30-35%, mild concentric LVH, severe HK of inf, inflat, & lat walls, mod MR, mild TR;  b. 04/2015 LV Gram: EF 55-65%.  . DM2 (diabetes mellitus, type 2) ([place])    a. since 2002  . Heavy menses    a. H/O IUD - expired in 2014 - remains in place.  . Hyperlipidemia   . Hypertension   . Hypotension    a. resulting in discontinuation of losartan and reduction in coreg dose.  . Iron deficiency anemia   . Ischemic cardiomyopathy    a. 03/2015 EF 30-35% post NSTEMI;  b. 04/2015 EF 55-65% on LV gram.  . Kidney disease   . MI (myocardial infarction) ([place]) 03/29/2015  . Obesity   . Renal insufficiency   . Tobacco  abuse    a. quit 03/2015.  Marland Kitchen Uterine fibroid     Past Surgical History:  Procedure Laterality Date  . CARDIAC CATHETERIZATION  4/16   x2 stent [facility]  . CARDIAC CATHETERIZATION N/A 05/05/2015   Procedure: Left Heart Cath and Coronary Angiography;  Surgeon: Peter M Martinique, MD;  Location: [facility];  Service: Cardiovascular;  Laterality: N/A;  . COLONOSCOPY WITH PROPOFOL N/A 07/31/2016   Procedure: COLONOSCOPY WITH PROPOFOL;  Surgeon: Lollie Sails, MD;  Location: [facility] ENDOSCOPY;  Service: Endoscopy;  Laterality: N/A;  . ESOPHAGOGASTRODUODENOSCOPY (EGD) WITH PROPOFOL N/A 07/31/2016   Procedure: ESOPHAGOGASTRODUODENOSCOPY (EGD) WITH PROPOFOL;  Surgeon: Lollie Sails, MD;  Location: [facility] ENDOSCOPY;  Service: Endoscopy;  Laterality: N/A;  . MOUTH SURGERY      Family History  Problem Relation Age of Onset  . Diabetes Father   . Cancer Maternal Grandmother     lung  . Cancer Maternal Grandfather     prostate  . Diabetes Paternal Grandfather   . Cancer - Cervical Maternal Aunt     Social History Social History  Substance Use Topics  . Smoking status: Former Smoker    Years: 15.00    Quit date: 03/10/2015  . Smokeless tobacco: Never Used  . Alcohol use No    Allergies  Allergen Reactions  . Lisinopril Anaphylaxis, Itching and Swelling  . Rosemary Oil Anaphylaxis  .  Shellfish Allergy Itching and Swelling  . Metformin And Related Diarrhea, Nausea And Vomiting and Rash    Current Facility-Administered Medications  Medication Dose Route Frequency Provider Last Rate Last Dose  . fentaNYL (SUBLIMAZE) injection 50 mcg  50 mcg Intravenous Q20 Min PRN Carrie Mew, MD   50 mcg at 08/05/16 0240   Current Outpatient Prescriptions  Medication Sig Dispense Refill  . amoxicillin-clavulanate (AUGMENTIN) 875-125 MG tablet Take 1 tablet by mouth 2 (two) times daily. 20 tablet 0  . aspirin EC 81 MG tablet Take 81 mg by mouth daily.    . benzonatate (TESSALON) 200 MG capsule  Take 1 capsule (200 mg total) by mouth 2 (two) times daily as needed for cough. 20 capsule 0  . blood glucose meter kit and supplies KIT Dispense based on patient and insurance preference. Use up to four times daily as directed. E11.22. 1 each 0  . dicyclomine (BENTYL) 20 MG tablet Take 20 mg by mouth 4 (four) times daily as needed for spasms.     Marland Kitchen etonogestrel (NEXPLANON) 68 MG IMPL implant 1 each by Subdermal route once.    . ferrous sulfate 325 (65 FE) MG tablet Take 1 tablet (325 mg total) by mouth 3 (three) times daily with meals. (Patient taking differently: Take 325 mg by mouth daily with breakfast. ) 90 tablet 3  . glimepiride (AMARYL) 4 MG tablet Take 1 tablet (4 mg total) by mouth daily. 90 tablet 1  . glucose blood (ACCU-CHEK ACTIVE STRIPS) test strip Use as instructed 100 each 12  . Insulin Syringe-Needle U-100 (INSULIN SYRINGE 1CC/31GX5/16") 31G X 5/16" 1 ML MISC 1 Units by Does not apply route at bedtime. 90 each 3  . loperamide (IMODIUM) 2 MG capsule Take 2 mg by mouth as needed for diarrhea or loose stools.    . meclizine (ANTIVERT) 32 MG tablet Take 1 tablet (32 mg total) by mouth 3 (three) times daily as needed. 30 tablet 0  . Melatonin 5 MG TABS Take 5 mg by mouth daily.    . metoprolol tartrate (LOPRESSOR) 25 MG tablet Take 1 tablet (25 mg total) by mouth 2 (two) times daily. 60 tablet 3  . nitroGLYCERIN (NITROSTAT) 0.4 MG SL tablet Place 1 tablet (0.4 mg total) under the tongue every 5 (five) minutes as needed for chest pain. Reported on 02/20/2016 25 tablet 3  . pantoprazole (PROTONIX) 40 MG tablet Take 1 tablet (40 mg total) by mouth daily at 6 (six) AM. 90 tablet 1  . sitaGLIPtin (JANUVIA) 100 MG tablet Take 1 tablet (100 mg total) by mouth daily. 30 tablet 2  . ticagrelor (BRILINTA) 90 MG TABS tablet Take 1 tablet (90 mg total) by mouth 2 (two) times daily. 60 tablet 3  . venlafaxine XR (EFFEXOR-XR) 37.5 MG 24 hr capsule Take 37.5 mg by mouth daily with breakfast.        Review of Systems A Multi-point review of systems was asked and was negative except for the findings documented in the history of present illness  Physical Exam Blood pressure (!) 151/92, pulse 100, temperature 97.5 F (36.4 C), temperature source Oral, resp. rate 17, height '5\' 4"'$  (1.626 m), weight 94.3 kg (208 lb), last menstrual period 06/28/2016, SpO2 100 %. CONSTITUTIONAL: Resting in bed in no acute distress. EYES: Pupils are equal, round, and reactive to light, Sclera are non-icteric. EARS, NOSE, MOUTH AND THROAT: The oropharynx is clear. The oral mucosa is pink and moist. Hearing is intact to voice. LYMPH NODES:  Lymph nodes in the neck are normal. RESPIRATORY:  Lungs are clear. There is normal respiratory effort, with equal breath sounds bilaterally, and without pathologic use of accessory muscles. CARDIOVASCULAR: Heart is regular without murmurs, gallops, or rubs. GI: The abdomen is soft, tender to palpation in the right lower quadrant at McBurney's point, and nondistended. There are no palpable masses. There is no hepatosplenomegaly. There are normal bowel sounds in all quadrants. GU: Rectal deferred.   MUSCULOSKELETAL: Normal muscle strength and tone. No cyanosis or edema.   SKIN: Turgor is good and there are no pathologic skin lesions or ulcers. NEUROLOGIC: Motor and sensation is grossly normal. Cranial nerves are grossly intact. PSYCH:  Oriented to person, place and time. Affect is normal.  Data Reviewed Images and labs reviewed. Labs concerning for leukocytosis of 17, also with numerous electrolyte abnormalities including chloride of 112, CO2 of 19, BUN 26, calcium 8.4. CT scan shows a dilated appendix with periappendiceal fluid consistent with acute appendicitis. I have personally reviewed the patient's imaging, laboratory findings and medical records.    Assessment    Acute appendicitis.    Plan    35 year old female with acute appendicitis. A long conversation  with patient about the treatment of appendicitis both surgical and medical. Given her multiple medical problems and use of antiplatelet drugs Brilinta and aspirin, discussed that admission and antibiotics would be the safest course of action for her. A primary concern is her cardiac status. She is having a cardiac catheter with 2 stents placed last year and carries the diagnosis of congestive heart failure. She will need medical and cardiac clearance. However, regardless of whether not she received clearance should she clinically deteriorate discussed that she may require emergent surgery for her appendicitis. Patient voiced understanding and is willing to accept admission. Plan to admit to the MedSurg unit. Obtain medical and cardiac consultations. Keep nothing by mouth. IV fluids, IV antibiotics, as needed medications. All questions answered to her satisfaction and she will be monitored very closely.     Time spent with the patient was 70 minutes, with more than 50% of the time spent in face-to-face education, counseling and care coordination.     Clayburn Pert, MD FACS General Surgeon 08/05/2016, 6:15 AM

## 2016-08-05 NOTE — ED Notes (Signed)
Patient transported to Ultrasound 

## 2016-08-05 NOTE — ED Notes (Signed)
MD stafford to bedside

## 2016-08-05 NOTE — ED Triage Notes (Signed)
Pt states ruq pain for "weeks" with associated nausea, vomiting, diarrhea. Pt states pain is worse in last 5 hours, pt moaning, yelling in triage. Pt denies known fever. Pt states pain now radiates to rlq. Pt denies shob. Skin warm and dry.

## 2016-08-06 LAB — MAGNESIUM: Magnesium: 1.5 mg/dL — ABNORMAL LOW (ref 1.7–2.4)

## 2016-08-06 LAB — BASIC METABOLIC PANEL
Anion gap: 6 (ref 5–15)
BUN: 17 mg/dL (ref 6–20)
CO2: 21 mmol/L — ABNORMAL LOW (ref 22–32)
Calcium: 7.7 mg/dL — ABNORMAL LOW (ref 8.9–10.3)
Chloride: 113 mmol/L — ABNORMAL HIGH (ref 101–111)
Creatinine, Ser: 0.88 mg/dL (ref 0.44–1.00)
GFR calc Af Amer: >60 mL/min (ref >60)
GFR calc non Af Amer: >60 mL/min (ref >60)
Glucose, Bld: 84 mg/dL (ref 65–99)
Potassium: 3.5 mmol/L (ref 3.5–5.1)
Sodium: 140 mmol/L (ref 135–145)

## 2016-08-06 LAB — GLUCOSE, CAPILLARY
Glucose-Capillary: 79 mg/dL (ref 65–99)
Glucose-Capillary: 97 mg/dL (ref 65–99)
Glucose-Capillary: 84 mg/dL (ref 65–99)
Glucose-Capillary: 175 mg/dL — ABNORMAL HIGH (ref 65–99)
Glucose-Capillary: 69 mg/dL (ref 65–99)
Glucose-Capillary: 155 mg/dL — ABNORMAL HIGH (ref 65–99)
Glucose-Capillary: 129 mg/dL — ABNORMAL HIGH (ref 65–99)

## 2016-08-06 LAB — CBC
HCT: 27.7 % — ABNORMAL LOW (ref 35.0–47.0)
Hemoglobin: 9.3 g/dL — ABNORMAL LOW (ref 12.0–16.0)
MCH: 27.2 pg (ref 26.0–34.0)
MCHC: 33.4 g/dL (ref 32.0–36.0)
MCV: 81.4 fL (ref 80.0–100.0)
Platelets: 257 10*3/uL (ref 150–440)
RBC: 3.41 MIL/uL — ABNORMAL LOW (ref 3.80–5.20)
RDW: 14.7 % — ABNORMAL HIGH (ref 11.5–14.5)
WBC: 12.3 10*3/uL — ABNORMAL HIGH (ref 3.6–11.0)

## 2016-08-06 LAB — PHOSPHORUS: Phosphorus: 2.4 mg/dL — ABNORMAL LOW (ref 2.5–4.6)

## 2016-08-06 MED ORDER — DEXTROSE 50 % IV SOLN
1.0000 | Freq: Once | INTRAVENOUS | Status: AC
  Administered 2016-08-06: 50 mL via INTRAVENOUS

## 2016-08-06 MED ORDER — DEXTROSE 5 % IV SOLN
INTRAVENOUS | Status: DC
  Administered 2016-08-06: 17:00:00 via INTRAVENOUS

## 2016-08-06 MED ORDER — DEXTROSE 50 % IV SOLN
INTRAVENOUS | Status: AC
  Administered 2016-08-06: 17:00:00
  Filled 2016-08-06: qty 50

## 2016-08-06 MED ORDER — KETOROLAC TROMETHAMINE 30 MG/ML IJ SOLN
15.0000 mg | Freq: Four times a day (QID) | INTRAMUSCULAR | Status: DC
  Administered 2016-08-06 – 2016-08-08 (×8): 15 mg via INTRAVENOUS
  Filled 2016-08-06 (×8): qty 1

## 2016-08-06 NOTE — Progress Notes (Signed)
35 yr old with mutliple medical issues including MI and drug elluding stents about a year prior as well as HTN, CHF, and DM, here with  acute appendicitis.  The patient has been on antibitoics and states that her pain is somewhat improved but when the pain medications start to wear off her pain increases back to 7 of 10 in the RLQ.  She states is is still very painfull when up and moving.  She endorses some nausea as well but no vomiting.    Vitals:   08/06/16 1348 08/06/16 2024  BP: (!) 146/76 (!) 152/86  Pulse: 79 80  Resp: 12 15  Temp: 98.3 F (36.8 C) 98.3 F (36.8 C)   I/O last 3 completed shifts: In: 3192.5 [I.V.:2904.5; IV Piggyback:288] Out: 800 [Urine:800] Total I/O In: 0  Out: 450 [Urine:450]   PE:  Gen: NAD Abd: soft, tender in RLQ, suprapubic and LLQ, + [place], no rebound tenderness Ext: no edema  CBC Latest Ref Rng & Units 08/06/2016 08/05/2016 07/20/2016  WBC 3.6 - 11.0 K/uL 12.3(H) 17.0(H) 13.0(H)  Hemoglobin 12.0 - 16.0 g/dL 9.3(L) 10.6(L) 10.9(L)  Hematocrit 35.0 - 47.0 % 27.7(L) 31.4(L) 33.9(L)  Platelets 150 - 440 K/uL 257 315 360   CMP Latest Ref Rng & Units 08/06/2016 08/05/2016 07/20/2016  Glucose 65 - 99 mg/dL 84 175(H) 183(H)  BUN 6 - 20 mg/dL 17 26(H) 21(H)  Creatinine 0.44 - 1.00 mg/dL 0.88 0.99 1.16(H)  Sodium 135 - 145 mmol/L 140 135 136  Potassium 3.5 - 5.1 mmol/L 3.5 4.6 3.8  Chloride 101 - 111 mmol/L 113(H) 112(H) 106  CO2 22 - 32 mmol/L 21(L) 19(L) 21(L)  Calcium 8.9 - 10.3 mg/dL 7.7(L) 8.4(L) 9.0  Total Protein 6.5 - 8.1 g/dL - 6.9 -  Total Bilirubin 0.3 - 1.2 mg/dL - 0.8 -  Alkaline Phos 38 - 126 U/L - 220(H) -  AST 15 - 41 U/L - 82(H) -  ALT 14 - 54 U/L - 90(H) -   A/P:  35 yr old with mutliple medical issues including MI and drug elluding stents about a year prior as well as HTN, CHF, and DM, here with  acute appendicitis  Patient has been on antibiotics but does not seem to be making significant improvement in her pain at this point.   Tomorrow would be off the [facility] for 5 days and could operate then. I will evaluate her in AM and if not significant improvement will take tomorrow for lap appy.    The risks, benefits, complications, treatment options, and expected outcomes were discussed with the patient. The treatment of antibiotics alone was discussed giving a 20% chance that this could fail and surgery would be necessary.  Also discussed continuing to the operating room for Laparoscopic Appendectomy.  The possibilities of  bleeding, recurrent infection, perforation of viscus, finding a normal appendix, the need for additional procedures, failure to diagnose a condition, conversion to open procedure and creating a complication requiring transfusion or further operations were discussed. The patient was given the opportunity to ask questions and have them answered.

## 2016-08-06 NOTE — Progress Notes (Signed)
[facility] [place] NAME: [name] [name]    MR#:  [id number]  DATE OF BIRTH:  January 16, 1981  SUBJECTIVE:   She is here due to right lower abdominal pain, nausea vomiting and noted to have acute appendicitis. Being managed medically. No chest pain, shortness of breath.  REVIEW OF SYSTEMS:    Review of Systems  Constitutional: Negative for chills and fever.  HENT: Negative for congestion and tinnitus.   Eyes: Negative for blurred vision and double vision.  Respiratory: Negative for cough, shortness of breath and wheezing.   Cardiovascular: Negative for chest pain, orthopnea and PND.  Gastrointestinal: Positive for abdominal pain and nausea. Negative for diarrhea and vomiting.  Genitourinary: Negative for dysuria and hematuria.  Neurological: Negative for dizziness, sensory change and focal weakness.  All other systems reviewed and are negative.   Nutrition: NPO Tolerating Diet:No Tolerating PT: Ambulatory   DRUG ALLERGIES:   Allergies  Allergen Reactions  . Lisinopril Anaphylaxis, Itching and Swelling  . Rosemary Oil Anaphylaxis  . Shellfish Allergy Itching and Swelling  . Metformin And Related Diarrhea, Nausea And Vomiting and Rash    VITALS:  Blood pressure (!) 146/76, pulse 79, temperature 98.3 F (36.8 C), temperature source Oral, resp. rate 12, height 5\' 4"  (1.626 m), weight 94.3 kg (208 lb), last menstrual period 06/28/2016, SpO2 98 %.  PHYSICAL EXAMINATION:   Physical Exam  GENERAL:  35 y.o.-year-old patient lying in the bed in no acute distress.  EYES: Pupils equal, round, reactive to light and accommodation. No scleral icterus. Extraocular muscles intact.  HEENT: Head atraumatic, normocephalic. Oropharynx and nasopharynx clear.  NECK:  Supple, no jugular venous distention. No thyroid enlargement, no tenderness.  LUNGS: Normal breath sounds bilaterally, no wheezing, rales, rhonchi. No use of accessory muscles of  respiration.  CARDIOVASCULAR: S1, S2 normal. No murmurs, rubs, or gallops.  ABDOMEN: Soft, Tender in RLQ, No rebound, rigidity, nondistended. Bowel sounds present. No organomegaly or mass.  EXTREMITIES: No cyanosis, clubbing or edema b/l.    NEUROLOGIC: Cranial nerves II through XII are intact. No focal Motor or sensory deficits b/l.   PSYCHIATRIC: The patient is alert and oriented x 3.  SKIN: No obvious rash, lesion, or ulcer.    LABORATORY PANEL:   CBC  Recent Labs Lab 08/06/16 0411  WBC 12.3*  HGB 9.3*  HCT 27.7*  PLT 257   ------------------------------------------------------------------------------------------------------------------  Chemistries   Recent Labs Lab 08/05/16 0232 08/06/16 0411  NA 135 140  K 4.6 3.5  CL 112* 113*  CO2 19* 21*  GLUCOSE 175* 84  BUN 26* 17  CREATININE 0.99 0.88  CALCIUM 8.4* 7.7*  MG  --  1.5*  AST 82*  --   ALT 90*  --   ALKPHOS 220*  --   BILITOT 0.8  --    ------------------------------------------------------------------------------------------------------------------  Cardiac Enzymes  Recent Labs Lab 08/05/16 0232  TROPONINI <0.03   ------------------------------------------------------------------------------------------------------------------  RADIOLOGY:  Ct Angio Chest Pe W And/or Wo Contrast  Result Date: 08/05/2016 CLINICAL DATA:  RIGHT upper quadrant pain, nausea, vomiting and diarrhea for few weeks, worsening for 5 hours. Pending cholecystectomy. History of CHF, diabetes, hypertension. EXAM: CT ANGIOGRAPHY CHEST CT ABDOMEN AND PELVIS WITH CONTRAST TECHNIQUE: Multidetector CT imaging of the chest was performed using the standard protocol during bolus administration of intravenous contrast. Multiplanar CT image reconstructions and MIPs were obtained to evaluate the vascular anatomy. Multidetector CT imaging of the abdomen and pelvis was performed using the standard  protocol during bolus administration of  intravenous contrast. CONTRAST:  100 cc Isovue 370 COMPARISON:  RIGHT upper quadrant ultrasound August 05, 2016 at 0326 hours FINDINGS: CTA CHEST FINDINGS PULMONARY ARTERY: Adequate contrast opacification of the pulmonary artery's. Main pulmonary artery is not enlarged. No pulmonary arterial filling defects to the level of the subsegmental branches. MEDIASTINUM: Heart is mildly enlarged, no right heart strain. Mild coronary artery calcification. Thoracic aorta is normal course and caliber, unremarkable. No lymphadenopathy by CT size criteria. Small amount of residual thymic tissue. Mild gaseous and contrast distended esophagus associated with reflux. LUNGS: Tracheobronchial tree is patent, no pneumothorax. No pleural effusions, focal consolidations, pulmonary nodules or masses. SOFT TISSUES AND OSSEOUS STRUCTURES: Included view of the abdomen is unremarkable. Visualized soft tissues and included osseous structures appear normal. CT ABDOMEN and PELVIS FINDINGS SOLID ORGANS: The liver, spleen, gallbladder, pancreas and adrenal glands are unremarkable. GASTROINTESTINAL TRACT: The stomach, small and large bowel are normal in course and caliber without inflammatory changes. Elongated appendix is enlarged, 14 mm, hyperemic with periappendiceal inflammation. Appendiceal tip at pelvic cul-de-sac. Punctate appendicolith. KIDNEYS/ URINARY TRACT: Kidneys are orthotopic, demonstrating symmetric enhancement. No nephrolithiasis, hydronephrosis or solid renal masses. The unopacified ureters are normal in course and caliber. Delayed imaging through the kidneys demonstrates symmetric prompt contrast excretion within the proximal urinary collecting system. Urinary bladder is partially distended and unremarkable. PERITONEUM/RETROPERITONEUM: Aortoiliac vessels are normal in course and caliber. No lymphadenopathy by CT size criteria. Internal reproductive organs are nonsuspicious 15 mm probable Gartner's cyst. Benign-appearing 3 cm  RIGHT adnexal cyst, no indicated follow-up. Small amount of free fluid in the pelvis without focal fluid collection or abscess. No intraperitoneal free air. SOFT TISSUE/OSSEOUS STRUCTURES: Non-suspicious. Review of the MIP images confirms the above findings. IMPRESSION: CTA CHEST:  No acute pulmonary embolism or acute pulmonary process. Mild cardiomegaly and mild coronary artery calcifications. CT ABDOMEN AND PELVIS:  Acute appendicitis without perforation. Mild calcific atherosclerosis. Acute findings discussed with and reconfirmed by Dr.PHILLIP STAFFORD on 08/05/2016 at 5:24 am. Electronically Signed   By: Elon Alas M.D.   On: 08/05/2016 05:27   Ct Abdomen Pelvis W Contrast  Result Date: 08/05/2016 CLINICAL DATA:  RIGHT upper quadrant pain, nausea, vomiting and diarrhea for few weeks, worsening for 5 hours. Pending cholecystectomy. History of CHF, diabetes, hypertension. EXAM: CT ANGIOGRAPHY CHEST CT ABDOMEN AND PELVIS WITH CONTRAST TECHNIQUE: Multidetector CT imaging of the chest was performed using the standard protocol during bolus administration of intravenous contrast. Multiplanar CT image reconstructions and MIPs were obtained to evaluate the vascular anatomy. Multidetector CT imaging of the abdomen and pelvis was performed using the standard protocol during bolus administration of intravenous contrast. CONTRAST:  100 cc Isovue 370 COMPARISON:  RIGHT upper quadrant ultrasound August 05, 2016 at 0326 hours FINDINGS: CTA CHEST FINDINGS PULMONARY ARTERY: Adequate contrast opacification of the pulmonary artery's. Main pulmonary artery is not enlarged. No pulmonary arterial filling defects to the level of the subsegmental branches. MEDIASTINUM: Heart is mildly enlarged, no right heart strain. Mild coronary artery calcification. Thoracic aorta is normal course and caliber, unremarkable. No lymphadenopathy by CT size criteria. Small amount of residual thymic tissue. Mild gaseous and contrast distended  esophagus associated with reflux. LUNGS: Tracheobronchial tree is patent, no pneumothorax. No pleural effusions, focal consolidations, pulmonary nodules or masses. SOFT TISSUES AND OSSEOUS STRUCTURES: Included view of the abdomen is unremarkable. Visualized soft tissues and included osseous structures appear normal. CT ABDOMEN and PELVIS FINDINGS SOLID ORGANS: The liver, spleen, gallbladder, pancreas and  adrenal glands are unremarkable. GASTROINTESTINAL TRACT: The stomach, small and large bowel are normal in course and caliber without inflammatory changes. Elongated appendix is enlarged, 14 mm, hyperemic with periappendiceal inflammation. Appendiceal tip at pelvic cul-de-sac. Punctate appendicolith. KIDNEYS/ URINARY TRACT: Kidneys are orthotopic, demonstrating symmetric enhancement. No nephrolithiasis, hydronephrosis or solid renal masses. The unopacified ureters are normal in course and caliber. Delayed imaging through the kidneys demonstrates symmetric prompt contrast excretion within the proximal urinary collecting system. Urinary bladder is partially distended and unremarkable. PERITONEUM/RETROPERITONEUM: Aortoiliac vessels are normal in course and caliber. No lymphadenopathy by CT size criteria. Internal reproductive organs are nonsuspicious 15 mm probable Gartner's cyst. Benign-appearing 3 cm RIGHT adnexal cyst, no indicated follow-up. Small amount of free fluid in the pelvis without focal fluid collection or abscess. No intraperitoneal free air. SOFT TISSUE/OSSEOUS STRUCTURES: Non-suspicious. Review of the MIP images confirms the above findings. IMPRESSION: CTA CHEST:  No acute pulmonary embolism or acute pulmonary process. Mild cardiomegaly and mild coronary artery calcifications. CT ABDOMEN AND PELVIS:  Acute appendicitis without perforation. Mild calcific atherosclerosis. Acute findings discussed with and reconfirmed by Dr.PHILLIP STAFFORD on 08/05/2016 at 5:24 am. Electronically Signed   By: Elon Alas M.D.   On: 08/05/2016 05:27   US Abdomen Limited Ruq  Result Date: 08/05/2016 CLINICAL DATA:  RIGHT upper quadrant pain, several weeks of nausea, worsening tonight. Leukocytosis. EXAM: US ABDOMEN LIMITED - RIGHT UPPER QUADRANT COMPARISON:  RIGHT upper quadrant ultrasound June 20, 2016 FINDINGS: Gallbladder: No gallstones or wall thickening visualized. No sonographic Murphy sign noted by sonographer. Common bile duct: Diameter: 3 mm Liver: No focal lesion identified. Within normal limits in parenchymal echogenicity. Hepatopetal portal vein. IMPRESSION: Negative RIGHT upper quadrant ultrasound. Electronically Signed   By: Elon Alas M.D.   On: 08/05/2016 04:02     ASSESSMENT AND PLAN:   36 year old female with past medical history of coronary artery disease status post stents, history of previous MI, diabetes, essential hypertension, GERD who presented to the hospital due to abdominal pain nausea vomiting and noted to have acute appendicitis.   1. Acute appendicitis-continue further care as per surgery. -Continue IV fluids, pain control, IV Zosyn. -Seen by cardiology and stable for surgery if needed.  2. History of coronary disease status post stent-no acute chest pain presently. -Continue aspirin, metoprolol - d/c brillinta as pt. May need surgery.  As per Cards it has been over a year since last cardiac stent and she no longer needs to be on Brillinta.   3. Essential HTN - cont. Metoprolol - PRN hydralazine.  4. DM - cont. SSI   All the records are reviewed and case discussed with Care Management/Social Worker. Management plans discussed with the patient, family and they are in agreement.  CODE STATUS: Full Code  DVT Prophylaxis: Heparin SQ  TOTAL TIME TAKING CARE OF THIS PATIENT: 25 minutes.   POSSIBLE D/C IN 2-3 DAYS, DEPENDING ON CLINICAL CONDITION.   Henreitta Leber M.D on 08/06/2016 at 2:42 PM  Between 7am to 6pm - Pager - [phone]  After 6pm go to  [email or web address] - [employer] [facility]  Office  [phone]  CC: Primary care physician; Kathrine Haddock, NP

## 2016-08-06 NOTE — Progress Notes (Signed)
Notified MD of pt's BG of 69. Pt was sweaty and dizzy. Orders given for one ampule of dextrose and to switch the lactated ringers to D5W at 125 ml/hr. Pt's BG rose to 175 after ampule of dextrose. Will continue to monitor. Ammie Dalton, RN

## 2016-08-06 NOTE — Plan of Care (Signed)
Problem: Nutrition: Goal: Adequate nutrition will be maintained Outcome: Not Progressing Pt remains NPO

## 2016-08-06 NOTE — Progress Notes (Signed)
Per Dr. Azalee Course okay to place patient on clears and NPO after midnight.

## 2016-08-07 ENCOUNTER — Encounter
Admission: EM | Disposition: A | Discharge status: Home / Self Care | Payer: Self-pay | Source: Home / Self Care | Attending: General Surgery

## 2016-08-07 ENCOUNTER — Encounter: Payer: Self-pay | Admitting: *Deleted

## 2016-08-07 ENCOUNTER — Inpatient Hospital Stay: Payer: Medicaid Other | Admitting: Certified Registered"

## 2016-08-07 HISTORY — PX: LAPAROSCOPIC APPENDECTOMY: SHX408

## 2016-08-07 LAB — GLUCOSE, CAPILLARY
Glucose-Capillary: 113 mg/dL — ABNORMAL HIGH (ref 65–99)
Glucose-Capillary: 116 mg/dL — ABNORMAL HIGH (ref 65–99)
Glucose-Capillary: 117 mg/dL — ABNORMAL HIGH (ref 65–99)
Glucose-Capillary: 124 mg/dL — ABNORMAL HIGH (ref 65–99)
Glucose-Capillary: 154 mg/dL — ABNORMAL HIGH (ref 65–99)
Glucose-Capillary: 186 mg/dL — ABNORMAL HIGH (ref 65–99)
Glucose-Capillary: 92 mg/dL (ref 65–99)
Glucose-Capillary: 93 mg/dL (ref 65–99)

## 2016-08-07 SURGERY — APPENDECTOMY, LAPAROSCOPIC
Anesthesia: General | Site: Abdomen | Wound class: Clean Contaminated

## 2016-08-07 MED ORDER — SODIUM CHLORIDE 0.9 % IV SOLN
INTRAVENOUS | Status: DC
  Administered 2016-08-07 (×2): via INTRAVENOUS

## 2016-08-07 MED ORDER — PROPOFOL 10 MG/ML IV BOLUS
INTRAVENOUS | Status: DC | PRN
  Administered 2016-08-07: 140 mg via INTRAVENOUS

## 2016-08-07 MED ORDER — DEXTROSE 5 % IV SOLN
INTRAVENOUS | Status: DC | PRN
  Administered 2016-08-07: 2 g via INTRAVENOUS

## 2016-08-07 MED ORDER — MORPHINE SULFATE (PF) 2 MG/ML IV SOLN: 2.0000 mg | INTRAVENOUS | Status: DC | PRN

## 2016-08-07 MED ORDER — GLYCOPYRROLATE 0.2 MG/ML IJ SOLN
INTRAMUSCULAR | Status: DC | PRN
  Administered 2016-08-07: 0.6 mg via INTRAVENOUS

## 2016-08-07 MED ORDER — ONDANSETRON HCL 4 MG/2ML IJ SOLN: 4.0000 mg | Freq: Once | INTRAMUSCULAR | Status: DC | PRN

## 2016-08-07 MED ORDER — LIDOCAINE HCL (CARDIAC) 20 MG/ML IV SOLN
INTRAVENOUS | Status: DC | PRN
  Administered 2016-08-07: 100 mg via INTRAVENOUS

## 2016-08-07 MED ORDER — CYCLOBENZAPRINE HCL 10 MG PO TABS
10.0000 mg | ORAL_TABLET | Freq: Three times a day (TID) | ORAL | Status: DC
  Administered 2016-08-07 – 2016-08-08 (×3): 10 mg via ORAL
  Filled 2016-08-07 (×3): qty 1

## 2016-08-07 MED ORDER — BUPIVACAINE HCL (PF) 0.5 % IJ SOLN
INTRAMUSCULAR | Status: DC | PRN
  Administered 2016-08-07: 30 mL

## 2016-08-07 MED ORDER — ACETAMINOPHEN 325 MG PO TABS
650.0000 mg | ORAL_TABLET | Freq: Four times a day (QID) | ORAL | Status: DC
  Administered 2016-08-07 – 2016-08-08 (×5): 650 mg via ORAL
  Filled 2016-08-07 (×5): qty 2

## 2016-08-07 MED ORDER — CEFOXITIN SODIUM 2 G IV SOLR
INTRAVENOUS | Status: AC
Start: 2016-08-07 — End: 2016-08-07
  Filled 2016-08-07: qty 2

## 2016-08-07 MED ORDER — ONDANSETRON HCL 4 MG/2ML IJ SOLN
INTRAMUSCULAR | Status: DC | PRN
  Administered 2016-08-07: 4 mg via INTRAVENOUS

## 2016-08-07 MED ORDER — FENTANYL CITRATE (PF) 100 MCG/2ML IJ SOLN
INTRAMUSCULAR | Status: AC
  Administered 2016-08-07: 25 ug via INTRAVENOUS
  Filled 2016-08-07: qty 2

## 2016-08-07 MED ORDER — LACTATED RINGERS IV SOLN
INTRAVENOUS | Status: DC
Start: 2016-08-07 — End: 2016-08-07
  Administered 2016-08-07: 04:00:00 via INTRAVENOUS

## 2016-08-07 MED ORDER — FENTANYL CITRATE (PF) 100 MCG/2ML IJ SOLN
INTRAMUSCULAR | Status: DC | PRN
  Administered 2016-08-07: 100 ug via INTRAVENOUS
  Administered 2016-08-07: 50 ug via INTRAVENOUS

## 2016-08-07 MED ORDER — FENTANYL CITRATE (PF) 100 MCG/2ML IJ SOLN
25.0000 ug | INTRAMUSCULAR | Status: DC | PRN
  Administered 2016-08-07: 25 ug via INTRAVENOUS

## 2016-08-07 MED ORDER — OXYCODONE HCL 5 MG PO TABS
10.0000 mg | ORAL_TABLET | ORAL | Status: DC | PRN
  Administered 2016-08-07: 10 mg via ORAL
  Filled 2016-08-07: qty 2

## 2016-08-07 MED ORDER — ROCURONIUM BROMIDE 100 MG/10ML IV SOLN
INTRAVENOUS | Status: DC | PRN
  Administered 2016-08-07: 50 mg via INTRAVENOUS

## 2016-08-07 MED ORDER — NEOSTIGMINE METHYLSULFATE 10 MG/10ML IV SOLN
INTRAVENOUS | Status: DC | PRN
  Administered 2016-08-07: 5 mg via INTRAVENOUS

## 2016-08-07 MED ORDER — BUPIVACAINE HCL (PF) 0.5 % IJ SOLN
INTRAMUSCULAR | Status: AC
  Filled 2016-08-07: qty 30

## 2016-08-07 SURGICAL SUPPLY — 35 items
CANISTER SUCT 1200ML W/VALVE (MISCELLANEOUS) ×3 IMPLANT
CHLORAPREP W/TINT 26ML (MISCELLANEOUS) ×3 IMPLANT
CUTTER FLEX LINEAR 45M (STAPLE) IMPLANT
ELECT CAUTERY BLADE 6.4 (BLADE) ×3 IMPLANT
ELECT REM PT RETURN 9FT ADLT (ELECTROSURGICAL) ×3
ELECTRODE REM PT RTRN 9FT ADLT (ELECTROSURGICAL) ×1 IMPLANT
ENDOPOUCH RETRIEVER 10 (MISCELLANEOUS) ×3 IMPLANT
GLOVE PI ORTHOPRO 6.5 (GLOVE) ×2
GLOVE PI ORTHOPRO STRL 6.5 (GLOVE) ×1 IMPLANT
GOWN STRL REUS W/ TWL LRG LVL3 (GOWN DISPOSABLE) ×2 IMPLANT
GOWN STRL REUS W/TWL LRG LVL3 (GOWN DISPOSABLE) ×4
IRRIGATION STRYKERFLOW (MISCELLANEOUS) ×1 IMPLANT
IRRIGATOR STRYKERFLOW (MISCELLANEOUS) ×3
IV NS 1000ML (IV SOLUTION) ×2
IV NS 1000ML BAXH (IV SOLUTION) ×1 IMPLANT
KIT RM TURNOVER STRD PROC AR (KITS) ×3 IMPLANT
LABEL OR SOLS (LABEL) ×3 IMPLANT
LIQUID BAND (GAUZE/BANDAGES/DRESSINGS) ×3 IMPLANT
NEEDLE HYPO 25X1 1.5 SAFETY (NEEDLE) ×3 IMPLANT
NS IRRIG 500ML POUR BTL (IV SOLUTION) ×3 IMPLANT
PACK LAP CHOLECYSTECTOMY (MISCELLANEOUS) ×3 IMPLANT
PENCIL ELECTRO HAND CTR (MISCELLANEOUS) ×3 IMPLANT
RELOAD 45 VASCULAR/THIN (ENDOMECHANICALS) ×6 IMPLANT
RELOAD STAPLE TA45 3.5 REG BLU (ENDOMECHANICALS) ×3 IMPLANT
SCISSORS METZENBAUM CVD 33 (INSTRUMENTS) ×3 IMPLANT
SHEARS HARMONIC ACE PLUS 36CM (ENDOMECHANICALS) IMPLANT
SLEEVE ENDOPATH XCEL 5M (ENDOMECHANICALS) ×3 IMPLANT
SUT MNCRL 4-0 (SUTURE) ×2
SUT MNCRL 4-0 27XMFL (SUTURE) ×1
SUT VICRYL 0 AB UR-6 (SUTURE) ×3 IMPLANT
SUTURE MNCRL 4-0 27XMF (SUTURE) ×1 IMPLANT
TRAY FOLEY W/METER SILVER 16FR (SET/KITS/TRAYS/PACK) ×3 IMPLANT
TROCAR XCEL BLUNT TIP 100MML (ENDOMECHANICALS) ×3 IMPLANT
TROCAR XCEL NON-BLD 5MMX100MML (ENDOMECHANICALS) ×6 IMPLANT
TUBING INSUFFLATOR HI FLOW (MISCELLANEOUS) ×3 IMPLANT

## 2016-08-07 NOTE — Anesthesia Preprocedure Evaluation (Signed)
Anesthesia Evaluation  Patient identified by MRN, date of birth, ID band Patient awake    Reviewed: Allergy & Precautions, NPO status , Patient's Chart, lab work & pertinent test results  Airway Mallampati: II       Dental  (+) Teeth Intact   Pulmonary former smoker,    breath sounds clear to auscultation       Cardiovascular Exercise Tolerance: Good hypertension, Pt. on home beta blockers + CAD, + Past MI and + Cardiac Stents   Rhythm:Regular     Neuro/Psych    GI/Hepatic negative GI ROS,   Endo/Other  diabetes, Type 2, Oral Hypoglycemic AgentsMorbid obesity  Renal/GU      Musculoskeletal   Abdominal (+) + obese,   Peds  Hematology  (+) anemia ,   Anesthesia Other Findings   Reproductive/Obstetrics                             Anesthesia Physical Anesthesia Plan  ASA: III  Anesthesia Plan: General   Post-op Pain Management:    Induction: Intravenous  Airway Management Planned: Oral ETT  Additional Equipment:   Intra-op Plan:   Post-operative Plan: Extubation in OR  Informed Consent: I have reviewed the patients History and Physical, chart, labs and discussed the procedure including the risks, benefits and alternatives for the proposed anesthesia with the patient or authorized representative who has indicated his/her understanding and acceptance.     Plan Discussed with: CRNA  Anesthesia Plan Comments:         Anesthesia Quick Evaluation

## 2016-08-07 NOTE — Anesthesia Postprocedure Evaluation (Signed)
Anesthesia Post Note  Patient: [name] [name]  Procedure(s) Performed: Procedure(s) (LRB): APPENDECTOMY LAPAROSCOPIC (N/A)  Patient location during evaluation: PACU Anesthesia Type: General Level of consciousness: awake Pain management: pain level controlled Vital Signs Assessment: post-procedure vital signs reviewed and stable Respiratory status: spontaneous breathing Cardiovascular status: stable Anesthetic complications: no    Last Vitals:  Vitals:   08/07/16 1327 08/07/16 1342  BP: (!) 158/84 (!) 153/87  Pulse: 77 75  Resp: (!) 9 15  Temp:  37.1 C    Last Pain:  Vitals:   08/07/16 1342  TempSrc:   PainSc: Asleep                 VAN STAVEREN,Caisen Mangas

## 2016-08-07 NOTE — Progress Notes (Signed)
35 yr old with mutliple medical issues including MI and drug elluding stents about a year prior as well as HTN, CHF, and DM, here with  acute appendicitis.  Patient states pain still not improving and she has been having nasuea and diarrhea as well.   Vitals:   08/06/16 2024 08/07/16 0422  BP: (!) 152/86 (!) 152/85  Pulse: 80 84  Resp: 15 16  Temp: 98.3 F (36.8 C) 98.8 F (37.1 C)   I/O last 3 completed shifts: In: 3105.1 [I.V.:2796.1; IV Piggyback:309] Out: 1250 [Urine:1250] No intake/output data recorded.   PE:  Gen: NAD Abd: soft, tender in RLQ, suprapubic and LLQ, + Rosvings, no rebound tenderness Ext: no edema  CBC Latest Ref Rng & Units 08/06/2016 08/05/2016 07/20/2016  WBC 3.6 - 11.0 K/uL 12.3(H) 17.0(H) 13.0(H)  Hemoglobin 12.0 - 16.0 g/dL 9.3(L) 10.6(L) 10.9(L)  Hematocrit 35.0 - 47.0 % 27.7(L) 31.4(L) 33.9(L)  Platelets 150 - 440 K/uL 257 315 360   CMP Latest Ref Rng & Units 08/06/2016 08/05/2016 07/20/2016  Glucose 65 - 99 mg/dL 84 175(H) 183(H)  BUN 6 - 20 mg/dL 17 26(H) 21(H)  Creatinine 0.44 - 1.00 mg/dL 0.88 0.99 1.16(H)  Sodium 135 - 145 mmol/L 140 135 136  Potassium 3.5 - 5.1 mmol/L 3.5 4.6 3.8  Chloride 101 - 111 mmol/L 113(H) 112(H) 106  CO2 22 - 32 mmol/L 21(L) 19(L) 21(L)  Calcium 8.9 - 10.3 mg/dL 7.7(L) 8.4(L) 9.0  Total Protein 6.5 - 8.1 g/dL - 6.9 -  Total Bilirubin 0.3 - 1.2 mg/dL - 0.8 -  Alkaline Phos 38 - 126 U/L - 220(H) -  AST 15 - 41 U/L - 82(H) -  ALT 14 - 54 U/L - 90(H) -   A/P:  35 yr old with mutliple medical issues including MI and drug elluding stents about a year prior as well as HTN, CHF, and DM, here with  acute appendicitis   Patient still not having improvement in pain today.  She has failed medical management of appendicitis   The risks, benefits, complications, treatment options, and expected outcomes were discussed with the patient. The treatment of antibiotics alone was discussed giving a 20% chance that this could fail and  surgery would be necessary.  Also discussed continuing to the operating room for Laparoscopic Appendectomy.  The possibilities of  bleeding, recurrent infection, perforation of viscus, finding a normal appendix, the need for additional procedures, failure to diagnose a condition, conversion to open procedure and creating a complication requiring transfusion or further operations were discussed. The patient was given the opportunity to ask questions and have them answered. We will book her for Lap appy today.

## 2016-08-07 NOTE — Anesthesia Procedure Notes (Signed)
Procedure Name: Intubation Performed by: Falynn Ailey Pre-anesthesia Checklist: Patient identified, Patient being monitored, Timeout performed, Emergency Drugs available and Suction available Patient Re-evaluated:Patient Re-evaluated prior to inductionOxygen Delivery Method: Circle system utilized Preoxygenation: Pre-oxygenation with 100% oxygen Intubation Type: IV induction Ventilation: Mask ventilation without difficulty Laryngoscope Size: Mac and 3 Grade View: Grade I Tube type: Oral Tube size: 7.0 mm Number of attempts: 1 Airway Equipment and Method: Stylet Placement Confirmation: ETT inserted through vocal cords under direct vision,  positive ETCO2 and breath sounds checked- equal and bilateral Secured at: 21 cm Tube secured with: Tape Dental Injury: Teeth and Oropharynx as per pre-operative assessment        

## 2016-08-07 NOTE — Op Note (Signed)
Appendectomy, Lap, Procedure Note  Indications: The patient presented with a history of right-sided abdominal pain. A CT scan revealed findings consistent with acute appendicitis.  Pre-operative Diagnosis: Acute appendicitis with generalized peritonitis  Post-operative Diagnosis: Same  Surgeon: Hubbard Robinson   Assistants: none  Anesthesia: General endotracheal anesthesia  ASA Class: 2  Procedure Details  The patient was seen again in the Holding Room. The risks, benefits, complications, treatment options, and expected outcomes were discussed with the patient and/or family. The possibilities of reaction to medication, pulmonary aspiration, perforation of viscus, bleeding, recurrent infection, finding a normal appendix, the need for additional procedures, failure to diagnose a condition, and creating a complication requiring transfusion or operation were discussed. There was concurrence with the proposed plan and informed consent was obtained. The site of surgery was properly noted/marked. The patient was taken to Operating Room, identified as [name] [name] and the procedure verified as Appendectomy. A Time Out was held and the above information confirmed.  The patient was placed in the supine position and general anesthesia was induced, along with placement of orogastric tube, Venodyne boots, and a Foley catheter. The abdomen was prepped and draped in a sterile fashion. A one centimeter infraumbilical incision was made and the peritoneal cavity was accessed Hasson technique.  The subcanteous tissues were dissected down to anterior fascia.  The fascia was elevated with Kocher clamps and incised with scalpel.  A 0-Vicryl figure of eight suture was placed in the fascia and used to secure the 71mm Hasson port.  Additional 5 mm cannulas then placed in the left lower quadrant of the abdomen and half way between the umbilicus and pubic symphysis under direct vision. A careful evaluation of the  entire abdomen was carried out. The patient was placed in Trendelenburg and left lateral decubitus position. The small intestines were retracted in the cephalad and left lateral direction away from the pelvis and right lower quadrant. The patient was found to have an enlarged and inflamed appendix that was extending into the pelvis. There was no evidence of perforation.  The appendix was carefully dissected. A window was made in the mesoappendix at the base of the appendix. A vascular load endo-GIA was fired across the mesoappendix. The appendix was divided at its base using an endo-GIA stapler. Minimal appendiceal stump was left in place. There was no evidence of bleeding, leakage, or complication after division of the appendix. Irrigation was also performed and irrigate suctioned from the abdomen as well.  The umbilical port site was closed using previously placed 0-vicryl sutures in a figure eight fashion at the level of the fascia. The trocar site skin wounds were closed using 4-0 Monocryl and sterile glue was applied.   Instrument, sponge, and needle counts were correct at the conclusion of the case.   Findings: The appendix was found to be inflamed. There were not signs of necrosis.  There was not perforation. There was not abscess formation.  Estimated Blood Loss:  less than 50 mL         Drains: none         Total IV Fluids: 109mL         Specimens: appendix         Complications:  None; patient tolerated the procedure well.         Disposition: PACU - hemodynamically stable.         Condition: stable

## 2016-08-07 NOTE — Progress Notes (Signed)
Spoke with Dr. Marcille Blanco regarding patient's glucose values.  IV fluids changed back to LR.

## 2016-08-07 NOTE — Progress Notes (Signed)
[facility] [place] NAME: [name] [name]    MR#:  [id number]  DATE OF BIRTH:  [birth year]  SUBJECTIVE:   She is here due to right lower abdominal pain, nausea vomiting and noted to have acute appendicitis. Not improving w/ IV abx and supportive care and therefore going for Lap appy later today.   REVIEW OF SYSTEMS:    Review of Systems  Constitutional: Negative for chills and fever.  HENT: Negative for congestion and tinnitus.   Eyes: Negative for blurred vision and double vision.  Respiratory: Negative for cough, shortness of breath and wheezing.   Cardiovascular: Negative for chest pain, orthopnea and PND.  Gastrointestinal: Positive for abdominal pain and nausea. Negative for diarrhea and vomiting.  Genitourinary: Negative for dysuria and hematuria.  Neurological: Negative for dizziness, sensory change and focal weakness.  All other systems reviewed and are negative.   Nutrition: NPO Tolerating Diet: No Tolerating PT: Ambulatory   DRUG ALLERGIES:   Allergies  Allergen Reactions  . Lisinopril Anaphylaxis, Itching and Swelling  . Rosemary Oil Anaphylaxis  . Shellfish Allergy Itching and Swelling  . Metformin And Related Diarrhea, Nausea And Vomiting and Rash    VITALS:  Blood pressure (!) 153/87, pulse 75, temperature 98.7 F (37.1 C), resp. rate 15, height 5\' 4"  (1.626 m), weight 94.3 kg (208 lb), last menstrual period 06/28/2016, SpO2 99 %.  PHYSICAL EXAMINATION:   Physical Exam  GENERAL:  35 y.o.-year-old patient lying in the bed in no acute distress.  EYES: Pupils equal, round, reactive to light and accommodation. No scleral icterus. Extraocular muscles intact.  HEENT: Head atraumatic, normocephalic. Oropharynx and nasopharynx clear.  NECK:  Supple, no jugular venous distention. No thyroid enlargement, no tenderness.  LUNGS: Normal breath sounds bilaterally, no wheezing, rales, rhonchi. No use of accessory muscles of  respiration.  CARDIOVASCULAR: S1, S2 normal. No murmurs, rubs, or gallops.  ABDOMEN: Soft, Tender in RLQ, No rebound, rigidity, nondistended. Bowel sounds present. No organomegaly or mass.  EXTREMITIES: No cyanosis, clubbing or edema b/l.    NEUROLOGIC: Cranial nerves II through XII are intact. No focal Motor or sensory deficits b/l.   PSYCHIATRIC: The patient is alert and oriented x 3.  SKIN: No obvious rash, lesion, or ulcer.    LABORATORY PANEL:   CBC  Recent Labs Lab 08/06/16 0411  WBC 12.3*  HGB 9.3*  HCT 27.7*  PLT 257   ------------------------------------------------------------------------------------------------------------------  Chemistries   Recent Labs Lab 08/05/16 0232 08/06/16 0411  NA 135 140  K 4.6 3.5  CL 112* 113*  CO2 19* 21*  GLUCOSE 175* 84  BUN 26* 17  CREATININE 0.99 0.88  CALCIUM 8.4* 7.7*  MG  --  1.5*  AST 82*  --   ALT 90*  --   ALKPHOS 220*  --   BILITOT 0.8  --    ------------------------------------------------------------------------------------------------------------------  Cardiac Enzymes  Recent Labs Lab 08/05/16 0232  TROPONINI <0.03   ------------------------------------------------------------------------------------------------------------------  RADIOLOGY:  No results found.   ASSESSMENT AND PLAN:   35 year old female with past medical history of coronary artery disease status post stents, history of previous MI, diabetes, essential hypertension, GERD who presented to the hospital due to abdominal pain nausea vomiting and noted to have acute appendicitis.   1. Acute appendicitis -Continue IV fluids, pain control, IV Zosyn. -Patient not improving with IV antibiotics and supportive care therefore going for laparoscopic appendectomy later today. -Seen by cardiology and stable for surgery.  2. History of coronary disease status post stent-no acute chest pain presently. -Continue aspirin, metoprolol.  Off  Brillinta -As per Cards it has been over a year since last cardiac stent and she no longer needs to be on Brillinta.   3. Essential HTN - cont. Metoprolol - PRN hydralazine.  4. DM - cont. SSI  Going to OR Later today.   All the records are reviewed and case discussed with Care Management/Social Worker. Management plans discussed with the patient, family and they are in agreement.  CODE STATUS: Full Code  DVT Prophylaxis: Heparin SQ  TOTAL TIME TAKING CARE OF THIS PATIENT: 25 minutes.   POSSIBLE D/C IN 2-3 DAYS, DEPENDING ON CLINICAL CONDITION.   Henreitta Leber M.D on 08/07/2016 at 2:35 PM  Between 7am to 6pm - Pager - [phone]  After 6pm go to [email or web address] - [employer] [facility]  Office  [phone]  CC: Primary care physician; Kathrine Haddock, NP

## 2016-08-07 NOTE — Transfer of Care (Signed)
Immediate Anesthesia Transfer of Care Note  Patient: [name] [name]  Procedure(s) Performed: Procedure(s): APPENDECTOMY LAPAROSCOPIC (N/A)  Patient Location: PACU  Anesthesia Type:General  Level of Consciousness: sedated and responds to stimulation  Airway & Oxygen Therapy: Patient Spontanous Breathing and Patient connected to face mask oxygen  Post-op Assessment: Report given to RN and Post -op Vital signs reviewed and stable  Post vital signs: Reviewed and stable  Last Vitals:  Vitals:   08/07/16 1044 08/07/16 1259  BP:  (!) 118/59  Pulse:  74  Resp:  15  Temp: 37.9 C     Last Pain:  Vitals:   08/07/16 1040  TempSrc: Tympanic  PainSc:          Complications: No apparent anesthesia complications

## 2016-08-08 ENCOUNTER — Encounter: Payer: Self-pay | Admitting: Cardiovascular Disease

## 2016-08-08 ENCOUNTER — Encounter: Payer: Self-pay | Admitting: Surgery

## 2016-08-08 LAB — GLUCOSE, CAPILLARY
Glucose-Capillary: 100 mg/dL — ABNORMAL HIGH (ref 65–99)
Glucose-Capillary: 114 mg/dL — ABNORMAL HIGH (ref 65–99)
Glucose-Capillary: 118 mg/dL — ABNORMAL HIGH (ref 65–99)
Glucose-Capillary: 123 mg/dL — ABNORMAL HIGH (ref 65–99)

## 2016-08-08 LAB — SURGICAL PATHOLOGY

## 2016-08-08 MED ORDER — ENOXAPARIN SODIUM 40 MG/0.4ML ~~LOC~~ SOLN: 40.0000 mg | SUBCUTANEOUS | Status: DC

## 2016-08-08 MED ORDER — INSULIN ASPART 100 UNIT/ML ~~LOC~~ SOLN: 0.0000 [IU] | Freq: Every day | SUBCUTANEOUS | Status: DC

## 2016-08-08 MED ORDER — CYCLOBENZAPRINE HCL 10 MG PO TABS: 10.0000 mg | ORAL_TABLET | Freq: Three times a day (TID) | ORAL | 0 refills | Status: DC | PRN

## 2016-08-08 MED ORDER — AMOXICILLIN-POT CLAVULANATE 875-125 MG PO TABS: 1.0000 | ORAL_TABLET | Freq: Two times a day (BID) | ORAL | 0 refills | Status: DC

## 2016-08-08 MED ORDER — IBUPROFEN 800 MG PO TABS: 800.0000 mg | ORAL_TABLET | Freq: Three times a day (TID) | ORAL | 0 refills | Status: DC | PRN

## 2016-08-08 MED ORDER — FAMOTIDINE 20 MG PO TABS: 20.0000 mg | ORAL_TABLET | Freq: Two times a day (BID) | ORAL | Status: DC

## 2016-08-08 MED ORDER — INSULIN ASPART 100 UNIT/ML ~~LOC~~ SOLN: 0.0000 [IU] | Freq: Three times a day (TID) | SUBCUTANEOUS | Status: DC

## 2016-08-08 MED ORDER — OXYCODONE-ACETAMINOPHEN 5-325 MG PO TABS
1.0000 | ORAL_TABLET | ORAL | 0 refills | Status: DC | PRN
Start: 2016-08-08 — End: 2016-08-27

## 2016-08-08 NOTE — Progress Notes (Signed)
  ONCERNING: IV to Oral Route Change Policy  RECOMMENDATION: This patient is receiving famotidine by the intravenous route.  Based on criteria approved by the Pharmacy and Therapeutics Committee, the intravenous medication(s) is/are being converted to the equivalent oral dose form(s).   DESCRIPTION: These criteria include:  The patient is eating (either orally or via tube) and/or has been taking other orally administered medications for a least 24 hours  The patient has no evidence of active gastrointestinal bleeding or impaired GI absorption (gastrectomy, short bowel, patient on TNA or NPO).  If you have questions about this conversion, please contact the Pharmacy Department  []   [phone] )  [facility] [x]   [phone] )  [facility] []   [phone] )  [facility] []   [phone] )  [facility] []   [phone] )  [facility] 08/08/2016 1:44 PM

## 2016-08-08 NOTE — Discharge Summary (Signed)
Physician Discharge Summary  Patient ID: [name] [name] MRN: [id number] DOB/AGE: 09/06/1981 35 y.o.  Admit date: 08/05/2016 Discharge date: 08/08/2016  Admission Diagnoses: Acute appendiciitis   Discharge Diagnoses:  Principal Problem:   Appendicitis Active Problems:   Ischemic cardiomyopathy   Diabetes type 2, controlled ([place])   Obesity   Hyperlipidemia   Iron deficiency anemia   CAD (coronary artery disease)   Chronic systolic CHF (congestive heart failure) ([place])   Discharged Condition: good  Hospital Course: 35 yr old with multipel medical issues who had acute appendicitids. She was cleared by medicine And cardiology and her pain does not improve with antibiotics alone so she was taken to the operating room on 08/07/2016 by Dr. Heath Lark for laparoscopic appendectomy. The patient able postoperatively and was able to tolerate a regular diet without any difficulty. The patient is been up and moving around  pain well controlled. The patient is passing gas and otherwise doing well.  Consults: cardiology and medicine  Significant Diagnostic Studies:   Treatments: surgery:   Discharge Exam: Blood pressure (!) 151/84, pulse 77, temperature 98.5 F (36.9 C), temperature source Oral, resp. rate 18, height 5' 4" (1.626 m), weight 208 lb (94.3 kg), last menstrual period 06/28/2016, SpO2 100 %. General appearance: alert, cooperative and no distress GI: soft, minaimally tender, incisions c/d/i  Disposition: 01-Home or Self Care  Discharge Instructions    Call MD for:  difficulty breathing, headache or visual disturbances    Complete by:  As directed   Call MD for:  persistant nausea and vomiting    Complete by:  As directed   Call MD for:  redness, tenderness, or signs of infection (pain, swelling, redness, odor or green/yellow discharge around incision site)    Complete by:  As directed   Call MD for:  severe uncontrolled pain    Complete by:  As directed   Call MD for:  temperature  >100.4    Complete by:  As directed   Diet - low sodium heart healthy    Complete by:  As directed   Driving Restrictions    Complete by:  As directed   No driving while on prescription pain medication   Increase activity slowly    Complete by:  As directed   Lifting restrictions    Complete by:  As directed   No lifting over 15lbs for 3 weeks   May shower / Bathe    Complete by:  As directed   May walk up steps    Complete by:  As directed   No dressing needed    Complete by:  As directed       Medication List    STOP taking these medications   ticagrelor 90 MG Tabs tablet Commonly known as:  BRILINTA     TAKE these medications   amoxicillin-clavulanate 875-125 MG tablet Commonly known as:  AUGMENTIN Take 1 tablet by mouth 2 (two) times daily.   aspirin EC 81 MG tablet Take 81 mg by mouth daily.   benzonatate 200 MG capsule Commonly known as:  TESSALON Take 1 capsule (200 mg total) by mouth 2 (two) times daily as needed for cough.   blood glucose meter kit and supplies Kit Dispense based on patient and insurance preference. Use up to four times daily as directed. E11.22.   cyclobenzaprine 10 MG tablet Commonly known as:  FLEXERIL Take 1 tablet (10 mg total) by mouth 3 (three) times daily as needed for muscle spasms.   dicyclomine  20 MG tablet Commonly known as:  BENTYL Take 20 mg by mouth 4 (four) times daily as needed for spasms.   ferrous sulfate 325 (65 FE) MG tablet Take 1 tablet (325 mg total) by mouth 3 (three) times daily with meals. What changed:  when to take this   glimepiride 4 MG tablet Commonly known as:  AMARYL Take 1 tablet (4 mg total) by mouth daily.   glucose blood test strip Commonly known as:  ACCU-CHEK ACTIVE STRIPS Use as instructed   ibuprofen 800 MG tablet Commonly known as:  ADVIL,MOTRIN Take 1 tablet (800 mg total) by mouth every 8 (eight) hours as needed.   INSULIN SYRINGE 1CC/31GX5/16" 31G X 5/16" 1 ML Misc 1 Units by Does not  apply route at bedtime.   loperamide 2 MG capsule Commonly known as:  IMODIUM Take 2 mg by mouth as needed for diarrhea or loose stools.   meclizine 32 MG tablet Commonly known as:  ANTIVERT Take 1 tablet (32 mg total) by mouth 3 (three) times daily as needed.   Melatonin 5 MG Tabs Take 5 mg by mouth daily.   metoprolol tartrate 25 MG tablet Commonly known as:  LOPRESSOR Take 1 tablet (25 mg total) by mouth 2 (two) times daily.   NEXPLANON 68 MG Impl implant Generic drug:  etonogestrel 1 each by Subdermal route once.   nitroGLYCERIN 0.4 MG SL tablet Commonly known as:  NITROSTAT Place 1 tablet (0.4 mg total) under the tongue every 5 (five) minutes as needed for chest pain. Reported on 02/20/2016   oxyCODONE-acetaminophen 5-325 MG tablet Commonly known as:  ROXICET Take 1-2 tablets by mouth every 4 (four) hours as needed.   pantoprazole 40 MG tablet Commonly known as:  PROTONIX Take 1 tablet (40 mg total) by mouth daily at 6 (six) AM.   sitaGLIPtin 100 MG tablet Commonly known as:  JANUVIA Take 1 tablet (100 mg total) by mouth daily.   venlafaxine XR 37.5 MG 24 hr capsule Commonly known as:  EFFEXOR-XR Take 37.5 mg by mouth daily with breakfast.      Follow-up Information    Hubbard Robinson, MD On 08/27/2016.   Specialty:  Surgery Why:  Tuesday at 10:30am for hospital follow-up Contact information: [address] [place] [address] [phone]           Signed: Hubbard Robinson 08/08/2016, 7:27 PM

## 2016-08-08 NOTE — Progress Notes (Signed)
[place] [facility] [place] NAME: [name] [name]    MR#:  [id number]  DATE OF BIRTH:  09-29-1981  SUBJECTIVE:   She is here due to right lower abdominal pain, nausea vomiting and noted to have acute appendicitis.  S/p Laparoscopic appendectomy postoperative day #1. Abdominal pain improved. No nausea/vomiting.  REVIEW OF SYSTEMS:    Review of Systems  Constitutional: Negative for chills and fever.  HENT: Negative for congestion and tinnitus.   Eyes: Negative for blurred vision and double vision.  Respiratory: Negative for cough, shortness of breath and wheezing.   Cardiovascular: Negative for chest pain, orthopnea and PND.  Gastrointestinal: Positive for abdominal pain and nausea. Negative for diarrhea and vomiting.  Genitourinary: Negative for dysuria and hematuria.  Neurological: Negative for dizziness, sensory change and focal weakness.  All other systems reviewed and are negative.   Nutrition: Clear liquids Tolerating Diet: Yes Tolerating PT: Ambulatory   DRUG ALLERGIES:   Allergies  Allergen Reactions  . Lisinopril Anaphylaxis, Itching and Swelling  . Rosemary Oil Anaphylaxis  . Shellfish Allergy Itching and Swelling  . Metformin And Related Diarrhea, Nausea And Vomiting and Rash    VITALS:  Blood pressure (!) 151/84, pulse 77, temperature 98.5 F (36.9 C), temperature source Oral, resp. rate 18, height 5\' 4"  (1.626 m), weight 94.3 kg (208 lb), last menstrual period 06/28/2016, SpO2 100 %.  PHYSICAL EXAMINATION:   Physical Exam  GENERAL:  35 y.o.-year-old patient lying in the bed in no acute distress.  EYES: Pupils equal, round, reactive to light and accommodation. No scleral icterus. Extraocular muscles intact.  HEENT: Head atraumatic, normocephalic. Oropharynx and nasopharynx clear.  NECK:  Supple, no jugular venous distention. No thyroid enlargement, no tenderness.  LUNGS: Normal breath sounds bilaterally, no wheezing,  rales, rhonchi. No use of accessory muscles of respiration.  CARDIOVASCULAR: S1, S2 normal. No murmurs, rubs, or gallops.  ABDOMEN: Soft, Tender near the surgical sites, No rebound, rigidity, nondistended. Bowel sounds present. No organomegaly or mass.  EXTREMITIES: No cyanosis, clubbing or edema b/l.    NEUROLOGIC: Cranial nerves II through XII are intact. No focal Motor or sensory deficits b/l.   PSYCHIATRIC: The patient is alert and oriented x 3.  SKIN: No obvious rash, lesion, or ulcer.    LABORATORY PANEL:   CBC  Recent Labs Lab 08/06/16 0411  WBC 12.3*  HGB 9.3*  HCT 27.7*  PLT 257   ------------------------------------------------------------------------------------------------------------------  Chemistries   Recent Labs Lab 08/05/16 0232 08/06/16 0411  NA 135 140  K 4.6 3.5  CL 112* 113*  CO2 19* 21*  GLUCOSE 175* 84  BUN 26* 17  CREATININE 0.99 0.88  CALCIUM 8.4* 7.7*  MG  --  1.5*  AST 82*  --   ALT 90*  --   ALKPHOS 220*  --   BILITOT 0.8  --    ------------------------------------------------------------------------------------------------------------------  Cardiac Enzymes  Recent Labs Lab 08/05/16 0232  TROPONINI <0.03   ------------------------------------------------------------------------------------------------------------------  RADIOLOGY:  No results found.   ASSESSMENT AND PLAN:   35 year old female with past medical history of coronary artery disease status post stents, history of previous MI, diabetes, essential hypertension, GERD who presented to the hospital due to abdominal pain nausea vomiting and noted to have acute appendicitis.   1. Acute appendicitis - status post laparoscopic appendectomy postoperative day #1. -Continue pain control and further care as per surgery. Clinically improved. -Continue IV Zosyn.  2. History of coronary disease status post stent-no acute  chest pain presently. -Continue aspirin, metoprolol.   Off Brillinta -As per Cards it has been over a year since last cardiac stent and she no longer needs to be on Brillinta.   3. Essential HTN - cont. Metoprolol - PRN hydralazine.  4. DM - cont. SSI  Discussed w/ surgery and will sign off.  Please call back if any further help needed.   All the records are reviewed and case discussed with Care Management/Social Worker. Management plans discussed with the patient, family and they are in agreement.  CODE STATUS: Full Code  DVT Prophylaxis: Heparin SQ  TOTAL TIME TAKING CARE OF THIS PATIENT: 25 minutes.   POSSIBLE D/C IN 1-2 DAYS, DEPENDING ON CLINICAL CONDITION.   Henreitta Leber M.D on 08/08/2016 at 2:06 PM  Between 7am to 6pm - Pager - [phone]  After 6pm go to [email or web address] - [facility]  Office  [phone]  CC: Primary care physician; Kathrine Haddock, NP

## 2016-08-08 NOTE — Progress Notes (Signed)
Pt A and O x 4. VSS. Pt tolerating diet well. No complaints of pain or nausea. IV removed intact, prescriptions given. Pt voiced understanding of discharge instructions with no further questions. Pt discharged via wheelchair with axillary.   

## 2016-08-14 ENCOUNTER — Ambulatory Visit: Payer: Medicaid Other | Admitting: Gastroenterology

## 2016-08-15 ENCOUNTER — Ambulatory Visit: Payer: Medicaid Other | Admitting: Gastroenterology

## 2016-08-20 ENCOUNTER — Ambulatory Visit: Payer: Medicaid Other | Admitting: Family

## 2016-08-21 ENCOUNTER — Encounter: Payer: Self-pay | Admitting: Unknown Physician Specialty

## 2016-08-21 ENCOUNTER — Encounter: Payer: Self-pay | Admitting: Cardiovascular Disease

## 2016-08-22 MED ORDER — BENZONATATE 200 MG PO CAPS: 200.0000 mg | ORAL_CAPSULE | Freq: Two times a day (BID) | ORAL | 0 refills | Status: DC | PRN

## 2016-08-27 ENCOUNTER — Encounter: Payer: Self-pay | Admitting: Surgery

## 2016-08-27 ENCOUNTER — Ambulatory Visit (INDEPENDENT_AMBULATORY_CARE_PROVIDER_SITE_OTHER): Payer: Medicaid Other | Admitting: Surgery

## 2016-08-27 VITALS — BP 143/83 | HR 82 | Temp 98.6°F | Wt 217.0 lb

## 2016-08-27 DIAGNOSIS — K353 Acute appendicitis with localized peritonitis, without perforation or gangrene: Secondary | ICD-10-CM

## 2016-08-27 NOTE — Progress Notes (Signed)
35 year old female who had a laparoscopic appendectomy for acute appendicitis on 8/30. Patient has been doing well she's had no fevers or chills no nausea or vomiting. The patient states that her appetite is fair that she still getting him back and isn't as hungry as she was prior. The patient still has some abdominal pain around the umbilical incision site but it is minimal and she is no longer taking pain medicines. 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  Vitals:   08/27/16 1031  BP: (!) 143/83  Pulse: 82  Temp: 98.6 F (37 C)   PE:  Gen: NAD Abd: soft, nontender, incisions c/d/i, no erythema or drainage  A/P: Healing well after laparoscopic appendectomy. Instructed the patient's energy and appetite should slowly continue to increase over the next few weeks. Patient is following up with her GI doctor for her irritable bowel disease. Pathology reviewed and just acute appendicitis without any evidence of malignancy. She can follow-up with [place] with any questions or concerns.

## 2016-08-27 NOTE — Patient Instructions (Signed)
Please give us a call if you have any questions or concerns. 

## 2016-08-28 ENCOUNTER — Other Ambulatory Visit: Payer: Self-pay | Admitting: Family Medicine

## 2016-08-28 ENCOUNTER — Encounter: Payer: Self-pay | Admitting: Unknown Physician Specialty

## 2016-08-29 ENCOUNTER — Other Ambulatory Visit: Payer: Self-pay | Admitting: Unknown Physician Specialty

## 2016-08-29 MED ORDER — BENZONATATE 100 MG PO CAPS: 100.0000 mg | ORAL_CAPSULE | Freq: Two times a day (BID) | ORAL | 12 refills | Status: DC | PRN

## 2016-09-02 ENCOUNTER — Other Ambulatory Visit: Payer: Self-pay

## 2016-09-02 ENCOUNTER — Encounter: Payer: Self-pay | Admitting: Unknown Physician Specialty

## 2016-09-02 ENCOUNTER — Ambulatory Visit (INDEPENDENT_AMBULATORY_CARE_PROVIDER_SITE_OTHER): Payer: Medicaid Other | Admitting: Unknown Physician Specialty

## 2016-09-02 VITALS — BP 129/84 | HR 86 | Temp 98.1°F | Ht 65.8 in | Wt 207.6 lb

## 2016-09-02 DIAGNOSIS — E1122 Type 2 diabetes mellitus with diabetic chronic kidney disease: Secondary | ICD-10-CM

## 2016-09-02 DIAGNOSIS — E669 Obesity, unspecified: Secondary | ICD-10-CM

## 2016-09-02 DIAGNOSIS — R945 Abnormal results of liver function studies: Secondary | ICD-10-CM

## 2016-09-02 DIAGNOSIS — R7989 Other specified abnormal findings of blood chemistry: Secondary | ICD-10-CM | POA: Diagnosis not present

## 2016-09-02 DIAGNOSIS — Z23 Encounter for immunization: Secondary | ICD-10-CM

## 2016-09-02 DIAGNOSIS — N181 Chronic kidney disease, stage 1: Secondary | ICD-10-CM | POA: Diagnosis not present

## 2016-09-02 DIAGNOSIS — E785 Hyperlipidemia, unspecified: Secondary | ICD-10-CM | POA: Diagnosis not present

## 2016-09-02 DIAGNOSIS — Z794 Long term (current) use of insulin: Secondary | ICD-10-CM

## 2016-09-02 LAB — BAYER DCA HB A1C WAIVED: HB A1C (BAYER DCA - WAIVED): 6.9 % (ref <7.0)

## 2016-09-02 LAB — HEMOGLOBIN A1C: Hemoglobin A1C: 6.9

## 2016-09-02 NOTE — Assessment & Plan Note (Addendum)
Re-drawn today. Were trending down at last check.

## 2016-09-02 NOTE — Progress Notes (Signed)
BP 129/84 (BP Location: Left Arm, Patient Position: Sitting, Cuff Size: Large)   Pulse 86   Temp 98.1 F (36.7 C)   Ht 5' 5.8" (1.671 m) Comment: pt had shoes on  Wt 207 lb 9.6 oz (94.2 kg) Comment: pt had shoes on  SpO2 98%   BMI 33.71 kg/m    Subjective:    Patient ID: [name] [name], female    DOB: September 10, 1981, 35 y.o.   MRN: [id number]  HPI: [name] [name] is a 35 y.o. female  Chief Complaint  Patient presents with  . Diabetes    2 month f/up   Diabetes: Using medications without difficulties  Checking blood sugars several times day No hypoglycemic episodes - denies No hyperglycemic episodes - denies Feet problems: tingling Blood Sugars averaging: 160- 170's eye exam within last year - needs to schedule   ast Hgb A1C: 7.4  Hypertension  Using medications without difficulty  Average home BPs -  120's/130's - 80's   Using medication without pro/blems or lightheadedness  No chest pain with exertion or shortness of breath yes  No Edema   Elevated Cholesterol Not currently on medications.   Diet: reports a decreased appetite. But does tell me she eats yogurt and fruit.   Exercise: no exercise.      .Relevant past medical, surgical, family and social history reviewed and updated as indicated. Interim medical history since our last visit reviewed. Allergies and medications reviewed and updated.  Review of Systems  Respiratory: Negative for shortness of breath.   Cardiovascular: Negative for chest pain and leg swelling.  Gastrointestinal: Positive for nausea.    Per HPI unless specifically indicated above     Objective:    BP 129/84 (BP Location: Left Arm, Patient Position: Sitting, Cuff Size: Large)   Pulse 86   Temp 98.1 F (36.7 C)   Ht 5' 5.8" (1.671 m) Comment: pt had shoes on  Wt 207 lb 9.6 oz (94.2 kg) Comment: pt had shoes on  SpO2 98%   BMI 33.71 kg/m   Wt Readings from Last 3 Encounters:  09/02/16 207 lb 9.6 oz (94.2 kg)  08/27/16 217 lb  (98.4 kg)  08/07/16 208 lb (94.3 kg)    Physical Exam  Constitutional: She is oriented to person, place, and time. She appears well-developed and well-nourished. No distress.  HENT:  Head: Normocephalic and atraumatic.  Cardiovascular: Normal rate, regular rhythm and normal heart sounds.   Pulses:      Radial pulses are 2+ on the right side, and 2+ on the left side.       Dorsalis pedis pulses are 2+ on the right side, and 2+ on the left side.  No bruits noted   Pulmonary/Chest: Effort normal and breath sounds normal.  Abdominal: Soft.  Neurological: She is alert and oriented to person, place, and time.  Skin:     Psychiatric: She has a normal mood and affect. Her behavior is normal. Judgment and thought content normal.    Results for orders placed or performed during the hospital encounter of 08/05/16  CBC with Differential  Result Value Ref Range   WBC 17.0 (H) 3.6 - 11.0 K/uL   RBC 3.91 3.80 - 5.20 MIL/uL   Hemoglobin 10.6 (L) 12.0 - 16.0 g/dL   HCT 31.4 (L) 35.0 - 47.0 %   MCV 80.2 80.0 - 100.0 fL   MCH 27.2 26.0 - 34.0 pg   MCHC 33.9 32.0 - 36.0 g/dL   RDW 14.6 (H)  11.5 - 14.5 %   Platelets 315 150 - 440 K/uL   Neutrophils Relative % 85 %   Neutro Abs 14.5 (H) 1.4 - 6.5 K/uL   Lymphocytes Relative 7 %   Lymphs Abs 1.2 1.0 - 3.6 K/uL   Monocytes Relative 4 %   Monocytes Absolute 0.7 0.2 - 0.9 K/uL   Eosinophils Relative 3 %   Eosinophils Absolute 0.4 0 - 0.7 K/uL   Basophils Relative 1 %   Basophils Absolute 0.1 0 - 0.1 K/uL  Troponin I  Result Value Ref Range   Troponin I <0.03 <0.03 ng/mL  Comprehensive metabolic panel  Result Value Ref Range   Sodium 135 135 - 145 mmol/L   Potassium 4.6 3.5 - 5.1 mmol/L   Chloride 112 (H) 101 - 111 mmol/L   CO2 19 (L) 22 - 32 mmol/L   Glucose, Bld 175 (H) 65 - 99 mg/dL   BUN 26 (H) 6 - 20 mg/dL   Creatinine, Ser 0.99 0.44 - 1.00 mg/dL   Calcium 8.4 (L) 8.9 - 10.3 mg/dL   Total Protein 6.9 6.5 - 8.1 g/dL   Albumin 3.0  (L) 3.5 - 5.0 g/dL   AST 82 (H) 15 - 41 U/L   ALT 90 (H) 14 - 54 U/L   Alkaline Phosphatase 220 (H) 38 - 126 U/L   Total Bilirubin 0.8 0.3 - 1.2 mg/dL   GFR calc non Af Amer >60 >60 mL/min   GFR calc Af Amer >60 >60 mL/min   Anion gap 4 (L) 5 - 15  Lipase, blood  Result Value Ref Range   Lipase 25 11 - 51 U/L  Urinalysis complete, with microscopic ([facility] only)  Result Value Ref Range   Color, Urine YELLOW (A) YELLOW   APPearance CLEAR (A) CLEAR   Glucose, UA 50 (A) NEGATIVE mg/dL   Bilirubin Urine NEGATIVE NEGATIVE   Ketones, ur TRACE (A) NEGATIVE mg/dL   Specific Gravity, Urine 1.032 (H) 1.005 - 1.030   Hgb urine dipstick 3+ (A) NEGATIVE   pH 5.0 5.0 - 8.0   Protein, ur >500 (A) NEGATIVE mg/dL   Nitrite NEGATIVE NEGATIVE   Leukocytes, UA NEGATIVE NEGATIVE   RBC / HPF 6-30 0 - 5 RBC/hpf   WBC, UA 0-5 0 - 5 WBC/hpf   Bacteria, UA NONE SEEN NONE SEEN   Squamous Epithelial / LPF 0-5 (A) NONE SEEN  Pregnancy, urine  Result Value Ref Range   Preg Test, Ur NEGATIVE NEGATIVE  Urine Drug Screen, Qualitative  Result Value Ref Range   Tricyclic, Ur Screen NONE DETECTED NONE DETECTED   Amphetamines, Ur Screen NONE DETECTED NONE DETECTED   MDMA (Ecstasy)Ur Screen NONE DETECTED NONE DETECTED   Cocaine Metabolite,Ur [place] NONE DETECTED NONE DETECTED   Opiate, Ur Screen NONE DETECTED NONE DETECTED   Phencyclidine (PCP) Ur S NONE DETECTED NONE DETECTED   Cannabinoid 50 Ng, Ur [place] NONE DETECTED NONE DETECTED   Barbiturates, Ur Screen NONE DETECTED NONE DETECTED   Benzodiazepine, Ur Scrn NONE DETECTED NONE DETECTED   Methadone Scn, Ur NONE DETECTED NONE DETECTED  Glucose, capillary  Result Value Ref Range   Glucose-Capillary 112 (H) 65 - 99 mg/dL  Glucose, capillary  Result Value Ref Range   Glucose-Capillary 96 65 - 99 mg/dL  CBC  Result Value Ref Range   WBC 12.3 (H) 3.6 - 11.0 K/uL   RBC 3.41 (L) 3.80 - 5.20 MIL/uL   Hemoglobin 9.3 (L) 12.0 - 16.0 g/dL   HCT  27.7 (L) 35.0 - 47.0 %     MCV 81.4 80.0 - 100.0 fL   MCH 27.2 26.0 - 34.0 pg   MCHC 33.4 32.0 - 36.0 g/dL   RDW 14.7 (H) 11.5 - 14.5 %   Platelets 257 150 - 440 K/uL  Basic metabolic panel  Result Value Ref Range   Sodium 140 135 - 145 mmol/L   Potassium 3.5 3.5 - 5.1 mmol/L   Chloride 113 (H) 101 - 111 mmol/L   CO2 21 (L) 22 - 32 mmol/L   Glucose, Bld 84 65 - 99 mg/dL   BUN 17 6 - 20 mg/dL   Creatinine, Ser 0.88 0.44 - 1.00 mg/dL   Calcium 7.7 (L) 8.9 - 10.3 mg/dL   GFR calc non Af Amer >60 >60 mL/min   GFR calc Af Amer >60 >60 mL/min   Anion gap 6 5 - 15  Phosphorus  Result Value Ref Range   Phosphorus 2.4 (L) 2.5 - 4.6 mg/dL  Magnesium  Result Value Ref Range   Magnesium 1.5 (L) 1.7 - 2.4 mg/dL  Glucose, capillary  Result Value Ref Range   Glucose-Capillary 85 65 - 99 mg/dL  Glucose, capillary  Result Value Ref Range   Glucose-Capillary 86 65 - 99 mg/dL  Glucose, capillary  Result Value Ref Range   Glucose-Capillary 79 65 - 99 mg/dL  Glucose, capillary  Result Value Ref Range   Glucose-Capillary 97 65 - 99 mg/dL   Comment 1 Notify RN   Glucose, capillary  Result Value Ref Range   Glucose-Capillary 84 65 - 99 mg/dL  Glucose, capillary  Result Value Ref Range   Glucose-Capillary 69 65 - 99 mg/dL   Comment 1 Notify RN   Glucose, capillary  Result Value Ref Range   Glucose-Capillary 175 (H) 65 - 99 mg/dL  Glucose, capillary  Result Value Ref Range   Glucose-Capillary 155 (H) 65 - 99 mg/dL  Glucose, capillary  Result Value Ref Range   Glucose-Capillary 129 (H) 65 - 99 mg/dL  Glucose, capillary  Result Value Ref Range   Glucose-Capillary 186 (H) 65 - 99 mg/dL  Glucose, capillary  Result Value Ref Range   Glucose-Capillary 154 (H) 65 - 99 mg/dL  Glucose, capillary  Result Value Ref Range   Glucose-Capillary 116 (H) 65 - 99 mg/dL  Glucose, capillary  Result Value Ref Range   Glucose-Capillary 113 (H) 65 - 99 mg/dL  Glucose, capillary  Result Value Ref Range   Glucose-Capillary  117 (H) 65 - 99 mg/dL  Glucose, capillary  Result Value Ref Range   Glucose-Capillary 124 (H) 65 - 99 mg/dL   Comment 1 Notify RN   Glucose, capillary  Result Value Ref Range   Glucose-Capillary 93 65 - 99 mg/dL  Glucose, capillary  Result Value Ref Range   Glucose-Capillary 92 65 - 99 mg/dL  Glucose, capillary  Result Value Ref Range   Glucose-Capillary 100 (H) 65 - 99 mg/dL   Comment 1 Notify RN   Glucose, capillary  Result Value Ref Range   Glucose-Capillary 123 (H) 65 - 99 mg/dL   Comment 1 Notify RN   Glucose, capillary  Result Value Ref Range   Glucose-Capillary 118 (H) 65 - 99 mg/dL  Glucose, capillary  Result Value Ref Range   Glucose-Capillary 114 (H) 65 - 99 mg/dL  Surgical pathology  Result Value Ref Range   SURGICAL PATHOLOGY      Surgical Pathology CASE: [id number] PATIENT: [facility] Surgical Pathology Report  SPECIMEN SUBMITTED: A. Appendix  CLINICAL HISTORY: None provided  PRE-OPERATIVE DIAGNOSIS: Appendicitis  POST-OPERATIVE DIAGNOSIS: Same as pre-op     DIAGNOSIS: A. APPENDIX; LAPAROSCOPIC APPENDECTOMY: - ACUTE APPENDICITIS AND SEROSITIS. - NEGATIVE FOR DYSPLASIA AND MALIGNANCY.   GROSS DESCRIPTION:  A. Labeled: appendix  Size: 13.6 x 1.5 x 1.2 cm  External surface: diffusely shaggy purple tan with fibrinopurulent material  Perforation: no however sloughing and thinning focally identified 2 cm from the margin  Fecalith: no  Description:  The proximal margin is inked blue. The specimen contains purulent material.  Block summary: 1 - representative sections Final Diagnosis performed by Delorse Lek, MD.  Electronically signed 08/08/2016 10:35:07AM    The electronic signature indicates that the named Attending Pathologist has evaluated the specimen   Technical component performed at [facility], [address] [place], [place] [address] Lab: [phone] Dir: Darrick Penna. Evette Doffing, MD  Professional component performed  at [facility], [facility], [place], [place],  [address] Lab: [phone] Dir: Dellia Nims. Reuel Derby, MD        Assessment & Plan:   Problem List Items Addressed This Visit      Endocrine   Diabetes type 2, controlled ([place])    hbg A1C 6.9 today. Stable, continue current medications.       Relevant Orders   Comprehensive metabolic panel   Bayer DCA Hb A1c Waived     Other   Obesity    Has lost weight since her last visit. Continue with diet.       Hyperlipidemia    Lipid panel done today. Last cardiology note states that they will be readdressing this as well at her 3 month follow up in November.       Relevant Orders   Lipid Panel w/o Chol/HDL Ratio   Elevated liver function tests    Re-drawn today.        Other Visit Diagnoses    Need for influenza vaccination    -  Primary   Relevant Orders   Flu Vaccine QUAD 36+ mos IM (Completed)       Follow up plan: 3 months to recheck hbg A1C

## 2016-09-02 NOTE — Assessment & Plan Note (Signed)
Has lost weight since her last visit. Continue with diet.

## 2016-09-02 NOTE — Patient Instructions (Signed)

## 2016-09-02 NOTE — Assessment & Plan Note (Signed)
Lipid panel done today. Last cardiology note states that they will be readdressing this as well at her 3 month follow up in November.

## 2016-09-02 NOTE — Assessment & Plan Note (Signed)
hbg A1C 6.9 today. Stable, continue current medications.

## 2016-09-03 LAB — COMPREHENSIVE METABOLIC PANEL
ALT: 70 IU/L — ABNORMAL HIGH (ref 0–32)
AST: 32 IU/L (ref 0–40)
Albumin/Globulin Ratio: 1 — ABNORMAL LOW (ref 1.2–2.2)
Albumin: 3.2 g/dL — ABNORMAL LOW (ref 3.5–5.5)
Alkaline Phosphatase: 282 IU/L — ABNORMAL HIGH (ref 39–117)
BUN/Creatinine Ratio: 25 — ABNORMAL HIGH (ref 9–23)
BUN: 31 mg/dL — ABNORMAL HIGH (ref 6–20)
Bilirubin Total: <0.2 mg/dL (ref 0.0–1.2)
CO2: 20 mmol/L (ref 18–29)
Calcium: 9.1 mg/dL (ref 8.7–10.2)
Chloride: 101 mmol/L (ref 96–106)
Creatinine, Ser: 1.23 mg/dL — ABNORMAL HIGH (ref 0.57–1.00)
GFR calc Af Amer: 65 mL/min/{1.73_m2} (ref >59)
GFR calc non Af Amer: 57 mL/min/{1.73_m2} — ABNORMAL LOW (ref >59)
Globulin, Total: 3.2 g/dL (ref 1.5–4.5)
Glucose: 123 mg/dL — ABNORMAL HIGH (ref 65–99)
Potassium: 5.5 mmol/L — ABNORMAL HIGH (ref 3.5–5.2)
Sodium: 134 mmol/L (ref 134–144)
Total Protein: 6.4 g/dL (ref 6.0–8.5)

## 2016-09-03 LAB — LIPID PANEL W/O CHOL/HDL RATIO
Cholesterol, Total: 244 mg/dL — ABNORMAL HIGH (ref 100–199)
HDL: 58 mg/dL (ref >39)
LDL Calculated: 162 mg/dL — ABNORMAL HIGH (ref 0–99)
Triglycerides: 120 mg/dL (ref 0–149)
VLDL Cholesterol Cal: 24 mg/dL (ref 5–40)

## 2016-09-05 ENCOUNTER — Encounter: Payer: Self-pay | Admitting: Family

## 2016-09-05 ENCOUNTER — Encounter: Payer: Self-pay | Admitting: Cardiovascular Disease

## 2016-09-05 ENCOUNTER — Ambulatory Visit: Payer: Medicaid Other | Attending: Family | Admitting: Family

## 2016-09-05 VITALS — BP 142/90 | HR 89 | Resp 18 | Ht 64.0 in | Wt 209.0 lb

## 2016-09-05 DIAGNOSIS — I255 Ischemic cardiomyopathy: Secondary | ICD-10-CM | POA: Diagnosis not present

## 2016-09-05 DIAGNOSIS — Z91013 Allergy to seafood: Secondary | ICD-10-CM | POA: Diagnosis not present

## 2016-09-05 DIAGNOSIS — Z888 Allergy status to other drugs, medicaments and biological substances status: Secondary | ICD-10-CM | POA: Diagnosis not present

## 2016-09-05 DIAGNOSIS — Z87891 Personal history of nicotine dependence: Secondary | ICD-10-CM | POA: Diagnosis not present

## 2016-09-05 DIAGNOSIS — I209 Angina pectoris, unspecified: Secondary | ICD-10-CM

## 2016-09-05 DIAGNOSIS — Z7982 Long term (current) use of aspirin: Secondary | ICD-10-CM | POA: Diagnosis not present

## 2016-09-05 DIAGNOSIS — I110 Hypertensive heart disease with heart failure: Secondary | ICD-10-CM | POA: Diagnosis not present

## 2016-09-05 DIAGNOSIS — I252 Old myocardial infarction: Secondary | ICD-10-CM | POA: Insufficient documentation

## 2016-09-05 DIAGNOSIS — E669 Obesity, unspecified: Secondary | ICD-10-CM | POA: Insufficient documentation

## 2016-09-05 DIAGNOSIS — Z91018 Allergy to other foods: Secondary | ICD-10-CM | POA: Insufficient documentation

## 2016-09-05 DIAGNOSIS — E785 Hyperlipidemia, unspecified: Secondary | ICD-10-CM | POA: Diagnosis not present

## 2016-09-05 DIAGNOSIS — Z833 Family history of diabetes mellitus: Secondary | ICD-10-CM | POA: Insufficient documentation

## 2016-09-05 DIAGNOSIS — I130 Hypertensive heart and chronic kidney disease with heart failure and stage 1 through stage 4 chronic kidney disease, or unspecified chronic kidney disease: Secondary | ICD-10-CM | POA: Insufficient documentation

## 2016-09-05 DIAGNOSIS — Z95828 Presence of other vascular implants and grafts: Secondary | ICD-10-CM | POA: Insufficient documentation

## 2016-09-05 DIAGNOSIS — I5032 Chronic diastolic (congestive) heart failure: Secondary | ICD-10-CM | POA: Insufficient documentation

## 2016-09-05 DIAGNOSIS — D509 Iron deficiency anemia, unspecified: Secondary | ICD-10-CM | POA: Insufficient documentation

## 2016-09-05 DIAGNOSIS — N289 Disorder of kidney and ureter, unspecified: Secondary | ICD-10-CM | POA: Diagnosis not present

## 2016-09-05 DIAGNOSIS — I509 Heart failure, unspecified: Secondary | ICD-10-CM | POA: Insufficient documentation

## 2016-09-05 DIAGNOSIS — Z8049 Family history of malignant neoplasm of other genital organs: Secondary | ICD-10-CM | POA: Diagnosis not present

## 2016-09-05 DIAGNOSIS — I10 Essential (primary) hypertension: Secondary | ICD-10-CM

## 2016-09-05 DIAGNOSIS — Z8042 Family history of malignant neoplasm of prostate: Secondary | ICD-10-CM | POA: Insufficient documentation

## 2016-09-05 DIAGNOSIS — E119 Type 2 diabetes mellitus without complications: Secondary | ICD-10-CM | POA: Insufficient documentation

## 2016-09-05 DIAGNOSIS — Z801 Family history of malignant neoplasm of trachea, bronchus and lung: Secondary | ICD-10-CM | POA: Diagnosis not present

## 2016-09-05 DIAGNOSIS — N183 Chronic kidney disease, stage 3 unspecified: Secondary | ICD-10-CM | POA: Insufficient documentation

## 2016-09-05 DIAGNOSIS — N181 Chronic kidney disease, stage 1: Secondary | ICD-10-CM

## 2016-09-05 DIAGNOSIS — I214 Non-ST elevation (NSTEMI) myocardial infarction: Secondary | ICD-10-CM | POA: Diagnosis not present

## 2016-09-05 DIAGNOSIS — E1122 Type 2 diabetes mellitus with diabetic chronic kidney disease: Secondary | ICD-10-CM

## 2016-09-05 NOTE — Progress Notes (Signed)
Patient ID: [name] [name], female    DOB: [birth year], 35 y.o.   MRN: [id number]  HPI  [name] [name] is a 35 y/o female with a history of diabetes, hyperlipidemia, HTN, anemia, renal insufficiency, MI and heart failure with preserved ejection fraction.   She presented to [facility] in April of 2016 with NSTEMI. Echocardiogram showed an ejection fraction of 30% with severe inferior, inferolateral and lateral wall hypokinesis with moderate mitral regurgitation. Cardiac catheterization showed occluded mid left circumflex with significant disease affecting the proximal OM1. There was mild disease affecting the LAD and minor irregularities in the RCA. Ejection fraction was 35%. Successful angioplasty and drug-eluting stent placement to the mid LCX and proximal OM was performed. She underwent a repeat cardiac catheterization in May 2016 which showed patent stents in the left circumflex and OM1. Ejection fraction was 50-55%.   Has had hypotension for which losartan had to be stopped.   Last [facility] admission was 08/05/16 when she had a lap appendectomy done. Has had multiple visits to the ER with chest pain and the last time was 07/20/16 with chest pain.   She returns today for a follow-up visit. Currently not on a diuretic and she feels like she is retaining fluids with weight fluctuations at home. She says that she's gained 5-6 pounds in a few days and then it'll slowly come off. By our scale, she's 8.6 pounds heavier. Not adding salt to her food. Does report fatigue and shortness of breath with minimal exertion. Occasional swelling in her right lower leg, none today.   Past Medical History:  Diagnosis Date  . Chronic systolic CHF (congestive heart failure) ([place])    a. echo 03/2015: EF 30-35%, mild concentric LVH, severe HK of inf, inflat, & lat walls, mod MR, mild TR;  b. 04/2015 LV Gram: EF 55-65%.  . Diabetes type 2, controlled ([place])    a. since 2002   . Heavy menses    a. H/O IUD - expired in 2014 - remains in  place.  . Hyperlipidemia   . Hypertension   . Iron deficiency anemia   . Ischemic cardiomyopathy    a. 03/2015 EF 30-35% post NSTEMI;  b. 04/2015 EF 55-65% on LV gram.  . Kidney disease   . MI (myocardial infarction) ([place]) 03/29/2015  . Obesity   . Renal insufficiency   . Tobacco abuse    a. quit 03/2015.  Marland Kitchen Uterine fibroid     Past Surgical History:  Procedure Laterality Date  . CARDIAC CATHETERIZATION  4/16   x2 stent [facility]  . CARDIAC CATHETERIZATION N/A 05/05/2015   Procedure: Left Heart Cath and Coronary Angiography;  Surgeon: Peter M Martinique, MD;  Location: [facility] CV LAB;  Service: Cardiovascular;  Laterality: N/A;  . COLONOSCOPY WITH PROPOFOL N/A 07/31/2016   Procedure: COLONOSCOPY WITH PROPOFOL;  Surgeon: Lollie Sails, MD;  Location: [facility] ENDOSCOPY;  Service: Endoscopy;  Laterality: N/A;  . ESOPHAGOGASTRODUODENOSCOPY (EGD) WITH PROPOFOL N/A 07/31/2016   Procedure: ESOPHAGOGASTRODUODENOSCOPY (EGD) WITH PROPOFOL;  Surgeon: Lollie Sails, MD;  Location: [facility] ENDOSCOPY;  Service: Endoscopy;  Laterality: N/A;  . LAPAROSCOPIC APPENDECTOMY N/A 08/07/2016   Procedure: APPENDECTOMY LAPAROSCOPIC;  Surgeon: Hubbard Robinson, MD;  Location: [facility] ORS;  Service: General;  Laterality: N/A;  . MOUTH SURGERY      Family History  Problem Relation Age of Onset  . Diabetes Father   . Cancer Maternal Grandmother     lung  . Cancer Maternal Grandfather     prostate  .  Diabetes Paternal Grandfather   . Cancer - Cervical Maternal Aunt     Social History  Substance Use Topics  . Smoking status: Former Smoker    Years: 15.00    Quit date: 03/10/2015  . Smokeless tobacco: Never Used  . Alcohol use No    Allergies  Allergen Reactions  . Lisinopril Anaphylaxis, Itching and Swelling  . Rosemary Oil Anaphylaxis  . Shellfish Allergy Itching and Swelling  . Metformin And Related Diarrhea, Nausea And Vomiting and Rash    Prior to Admission medications   Medication Sig Start Date  End Date Taking? Authorizing Provider  aspirin EC 81 MG tablet Take 81 mg by mouth daily.   Yes Historical Provider, MD  benzonatate (TESSALON) 100 MG capsule Take 1 capsule (100 mg total) by mouth 2 (two) times daily as needed for cough. 08/29/16  Yes Kathrine Haddock, NP  dicyclomine (BENTYL) 20 MG tablet Take 20 mg by mouth 4 (four) times daily as needed for spasms.  06/28/16 06/28/17 Yes Historical Provider, MD  etonogestrel (NEXPLANON) 68 MG IMPL implant 1 each by Subdermal route once.   Yes Historical Provider, MD  ferrous sulfate 325 (65 FE) MG tablet Take 1 tablet (325 mg total) by mouth 3 (three) times daily with meals. Patient taking differently: Take 325 mg by mouth daily with breakfast.  03/06/16  Yes Kathrine Haddock, NP  glimepiride (AMARYL) 4 MG tablet Take 1 tablet (4 mg total) by mouth daily. 03/25/16  Yes Kathrine Haddock, NP  glucose blood (ACCU-CHEK ACTIVE STRIPS) test strip Use as instructed 03/25/16  Yes Kathrine Haddock, NP  ibuprofen (ADVIL,MOTRIN) 800 MG tablet Take 1 tablet (800 mg total) by mouth every 8 (eight) hours as needed. 08/08/16  Yes Hubbard Robinson, MD  loperamide (IMODIUM) 2 MG capsule Take 2 mg by mouth as needed for diarrhea or loose stools.   Yes Historical Provider, MD  meclizine (ANTIVERT) 32 MG tablet Take 1 tablet (32 mg total) by mouth 3 (three) times daily as needed. 06/21/16  Yes Kathrine Haddock, NP  Melatonin 5 MG TABS Take 5 mg by mouth daily.   Yes Historical Provider, MD  metoprolol tartrate (LOPRESSOR) 25 MG tablet Take 1 tablet (25 mg total) by mouth 2 (two) times daily. 06/27/16  Yes Wellington Hampshire, MD  nitroGLYCERIN (NITROSTAT) 0.4 MG SL tablet Place 1 tablet (0.4 mg total) under the tongue every 5 (five) minutes as needed for chest pain. Reported on 02/20/2016 03/11/16  Yes Wellington Hampshire, MD  pantoprazole (PROTONIX) 40 MG tablet Take 1 tablet (40 mg total) by mouth daily at 6 (six) AM. 03/25/16  Yes Kathrine Haddock, NP  sitaGLIPtin (JANUVIA) 100 MG tablet Take 1  tablet (100 mg total) by mouth daily. 07/01/16  Yes Kathrine Haddock, NP  venlafaxine XR (EFFEXOR-XR) 37.5 MG 24 hr capsule Take 75 mg by mouth daily with breakfast.    Yes Historical Provider, MD     Review of Systems  Constitutional: Positive for appetite change (decreased appetite) and fatigue.  HENT: Positive for postnasal drip. Negative for congestion and sore throat.   Eyes: Negative.   Respiratory: Positive for cough (middle of the  night), chest tightness and shortness of breath.   Cardiovascular: Negative for chest pain and leg swelling.  Gastrointestinal: Negative for abdominal distention and abdominal pain.  Endocrine: Positive for heat intolerance. Negative for cold intolerance.       Sweats easily & often   Genitourinary: Negative.   Musculoskeletal: Positive for back pain.  Negative for neck pain.  Skin: Negative.   Allergic/Immunologic: Negative.   Neurological: Positive for dizziness. Negative for light-headedness.  Hematological: Negative for adenopathy. Bruises/bleeds easily.  Psychiatric/Behavioral: Positive for dysphoric mood and sleep disturbance (trouble falling asleep; sleeping on 3 pillows). The patient is not nervous/anxious.    Vitals:   09/05/16 1027 09/05/16 1030  BP: (!) 160/99 (!) 142/90  Pulse: 89   Resp: 18   SpO2: 100%   Weight: 209 lb (94.8 kg)   Height: 5\' 4"  (1.626 m)       Physical Exam  Constitutional: She is oriented to person, place, and time. She appears well-developed and well-nourished.  HENT:  Head: Normocephalic and atraumatic.  Eyes: Conjunctivae are normal. Pupils are equal, round, and reactive to light.  Neck: Normal range of motion. Neck supple.  Cardiovascular: Normal rate and regular rhythm.   Pulmonary/Chest: Effort normal. She has no wheezes. She has no rales.  Abdominal: Soft. She exhibits no distension. There is no tenderness.  Musculoskeletal: She exhibits no edema or tenderness.  Neurological: She is alert and oriented  to person, place, and time.  Skin: Skin is warm and dry.  Psychiatric: She has a normal mood and affect. Her behavior is normal. Thought content normal.  Vitals reviewed.    Assessment & Plan:  1: Chronic heart failure with preserved ejection fraction- - NYHA Class 3 -Euvolemic today -Weight is up by our scale. Advised her to call her cardiologist about resuming diuretics a couple of days a week or based on weight gains at home. She would prefer to speak with him since she has underlying renal disease.  - Continue to not add salt to food - Has appointment with cardiologist Fletcher Anon) on 11/08/16  2: HTN- - Blood pressure mildly elevated but was coming down after recheck with a manual cuff - Sees PCP 12/03/16  3: Angina- - She continues to have intermittent angina for which she'll take SL NTG on occasion. Doesn't feel like her anginal pattern is different. - Call cardiologist if her anginal pattern changes  4: Diabetes-  - Glucose this morning was 120. - Last A1c on 09/02/16 was 6.9%   Patient did not bring her medications nor a list. Each medication was verbally reviewed with the patient and she was encouraged to bring the bottles to every visit to confirm accuracy of list.  Return in 3 months or sooner for any questions/problems before then.

## 2016-09-05 NOTE — Patient Instructions (Signed)
Continue weighing daily and call for an overnight weight gain of > 2 pounds or a weekly weight gain of >5 pounds. 

## 2016-09-07 ENCOUNTER — Encounter: Payer: Self-pay | Admitting: Unknown Physician Specialty

## 2016-09-09 ENCOUNTER — Other Ambulatory Visit: Payer: Self-pay

## 2016-09-09 DIAGNOSIS — R0600 Dyspnea, unspecified: Secondary | ICD-10-CM

## 2016-09-09 MED ORDER — GLIMEPIRIDE 4 MG PO TABS: 4.0000 mg | ORAL_TABLET | Freq: Every day | ORAL | 1 refills | Status: DC

## 2016-09-09 NOTE — Telephone Encounter (Signed)
Thanks, it is refilled

## 2016-09-09 NOTE — Progress Notes (Unsigned)
ech 

## 2016-09-10 ENCOUNTER — Encounter: Payer: Self-pay | Admitting: Unknown Physician Specialty

## 2016-09-11 ENCOUNTER — Encounter: Payer: Self-pay | Admitting: Unknown Physician Specialty

## 2016-09-16 ENCOUNTER — Encounter: Payer: Self-pay | Admitting: Unknown Physician Specialty

## 2016-09-16 ENCOUNTER — Other Ambulatory Visit: Payer: Self-pay | Admitting: Surgery

## 2016-09-17 NOTE — Telephone Encounter (Signed)
Patient may pick this up at the pharmacy as an over the counter medication. She does not need a prescription for this medication.

## 2016-09-17 NOTE — Telephone Encounter (Signed)
Routing to provider  

## 2016-09-20 ENCOUNTER — Ambulatory Visit (INDEPENDENT_AMBULATORY_CARE_PROVIDER_SITE_OTHER): Payer: Medicaid Other | Admitting: Unknown Physician Specialty

## 2016-09-20 ENCOUNTER — Encounter: Payer: Self-pay | Admitting: Unknown Physician Specialty

## 2016-09-20 VITALS — BP 141/88 | HR 93 | Temp 98.4°F | Wt 215.0 lb

## 2016-09-20 DIAGNOSIS — N390 Urinary tract infection, site not specified: Secondary | ICD-10-CM | POA: Insufficient documentation

## 2016-09-20 DIAGNOSIS — M545 Low back pain: Secondary | ICD-10-CM | POA: Diagnosis not present

## 2016-09-20 DIAGNOSIS — R5383 Other fatigue: Secondary | ICD-10-CM | POA: Diagnosis not present

## 2016-09-20 DIAGNOSIS — N3000 Acute cystitis without hematuria: Secondary | ICD-10-CM | POA: Diagnosis not present

## 2016-09-20 LAB — CBC WITH DIFFERENTIAL/PLATELET
Hematocrit: 34.2 % (ref 34.0–46.6)
Hemoglobin: 11.1 g/dL (ref 11.1–15.9)
Lymphocytes Absolute: 2.4 10*3/uL (ref 0.7–3.1)
Lymphs: 28 %
MCH: 27.7 pg (ref 26.6–33.0)
MCHC: 32.5 g/dL (ref 31.5–35.7)
MCV: 85 fL (ref 79–97)
MID (Absolute): 2.6 10*3/uL — ABNORMAL HIGH (ref 0.1–1.6)
MID: 30 %
Neutrophils Absolute: 3.5 10*3/uL (ref 1.4–7.0)
Neutrophils: 42 %
Platelets: 310 10*3/uL (ref 150–379)
RBC: 4.01 x10E6/uL (ref 3.77–5.28)
RDW: 14.6 % (ref 12.3–15.4)
WBC: 8.5 10*3/uL (ref 3.4–10.8)

## 2016-09-20 MED ORDER — CIPROFLOXACIN HCL 250 MG PO TABS: 250.0000 mg | ORAL_TABLET | Freq: Two times a day (BID) | ORAL | 0 refills | Status: DC

## 2016-09-20 MED ORDER — TRAMADOL HCL 50 MG PO TABS: 50.0000 mg | ORAL_TABLET | Freq: Three times a day (TID) | ORAL | 0 refills | Status: DC | PRN

## 2016-09-20 NOTE — Progress Notes (Signed)
   BP (!) 141/88 (BP Location: Left Arm, Patient Position: Sitting, Cuff Size: Large)   Pulse 93   Temp 98.4 F (36.9 C)   Wt 215 lb (97.5 kg) Comment: pt had shoes on  SpO2 98%   BMI 36.90 kg/m    Subjective:    Patient ID: [name] [name], female    DOB: 03/24/1981, 35 y.o.   MRN: [id number]  HPI: [name] [name] is a 35 y.o. female  Chief Complaint  Patient presents with  . Pain    pt states she has had back pain and rib pain for about a week now  . Headache    pt states she has had a constant headache for a week     Relevant past medical, surgical, family and social history reviewed and updated as indicated. Interim medical history since our last visit reviewed. Allergies and medications reviewed and updated.  Review of Systems  Per HPI unless specifically indicated above     Objective:    BP (!) 141/88 (BP Location: Left Arm, Patient Position: Sitting, Cuff Size: Large)   Pulse 93   Temp 98.4 F (36.9 C)   Wt 215 lb (97.5 kg) Comment: pt had shoes on  SpO2 98%   BMI 36.90 kg/m   Wt Readings from Last 3 Encounters:  09/20/16 215 lb (97.5 kg)  09/05/16 209 lb (94.8 kg)  09/02/16 207 lb 9.6 oz (94.2 kg)    Physical Exam  Constitutional: She is oriented to person, place, and time. She appears well-developed and well-nourished. No distress.  HENT:  Head: Normocephalic and atraumatic.  Eyes: Conjunctivae and lids are normal. Right eye exhibits no discharge. Left eye exhibits no discharge. No scleral icterus.  Neck: Normal range of motion. Neck supple. No JVD present. Carotid bruit is not present.  Cardiovascular: Normal rate, regular rhythm and normal heart sounds.   Pulmonary/Chest: Effort normal and breath sounds normal.  Abdominal: Normal appearance. There is no splenomegaly or hepatomegaly.  Musculoskeletal: Normal range of motion.  Neurological: She is alert and oriented to person, place, and time.  Skin: Skin is warm, dry and intact. No rash noted. No  pallor.  Psychiatric: She has a normal mood and affect. Her behavior is normal. Judgment and thought content normal.   CBC is normal Urine is positive  Results for orders placed or performed in visit on 09/02/16  Hemoglobin A1c  Result Value Ref Range   Hemoglobin A1C 6.9%       Assessment & Plan:   Problem List Items Addressed This Visit      Unprioritized   Urinary tract infection    Other Visit Diagnoses    Low back pain, unspecified back pain laterality, unspecified chronicity, with sciatica presence unspecified    -  Primary   Relevant Medications   traMADol (ULTRAM) 50 MG tablet   Other Relevant Orders   UA/M w/rflx Culture, Routine   Fatigue, unspecified type       Relevant Orders   Comprehensive metabolic panel   TSH   VITAMIN D 25 Hydroxy (Vit-D Deficiency, Fractures)   Lyme Ab/Western Blot Reflex   DG Chest 2 View   CBC With Differential/Platelet       Follow up plan: Return if symptoms worsen or fail to improve.

## 2016-09-22 LAB — UA/M W/RFLX CULTURE, ROUTINE
Bilirubin, UA: NEGATIVE
Glucose, UA: NEGATIVE
Ketones, UA: NEGATIVE
Nitrite, UA: NEGATIVE
Specific Gravity, UA: >1.03 — ABNORMAL HIGH (ref 1.005–1.030)
Urobilinogen, Ur: 0.2 mg/dL (ref 0.2–1.0)
pH, UA: 5.5 (ref 5.0–7.5)

## 2016-09-22 LAB — URINE CULTURE, REFLEX: Organism ID, Bacteria: NO GROWTH

## 2016-09-22 LAB — MICROSCOPIC EXAMINATION

## 2016-09-23 ENCOUNTER — Other Ambulatory Visit: Payer: Self-pay | Admitting: Unknown Physician Specialty

## 2016-09-23 ENCOUNTER — Telehealth: Payer: Self-pay | Admitting: Unknown Physician Specialty

## 2016-09-23 LAB — COMPREHENSIVE METABOLIC PANEL
ALT: 35 IU/L — ABNORMAL HIGH (ref 0–32)
AST: 27 IU/L (ref 0–40)
Albumin/Globulin Ratio: 0.9 — ABNORMAL LOW (ref 1.2–2.2)
Albumin: 2.9 g/dL — ABNORMAL LOW (ref 3.5–5.5)
Alkaline Phosphatase: 166 IU/L — ABNORMAL HIGH (ref 39–117)
BUN/Creatinine Ratio: 19 (ref 9–23)
BUN: 23 mg/dL — ABNORMAL HIGH (ref 6–20)
Bilirubin Total: <0.2 mg/dL (ref 0.0–1.2)
CO2: 20 mmol/L (ref 18–29)
Calcium: 8.4 mg/dL — ABNORMAL LOW (ref 8.7–10.2)
Chloride: 107 mmol/L — ABNORMAL HIGH (ref 96–106)
Creatinine, Ser: 1.22 mg/dL — ABNORMAL HIGH (ref 0.57–1.00)
GFR calc Af Amer: 66 mL/min/{1.73_m2} (ref >59)
GFR calc non Af Amer: 57 mL/min/{1.73_m2} — ABNORMAL LOW (ref >59)
Globulin, Total: 3.1 g/dL (ref 1.5–4.5)
Glucose: 192 mg/dL — ABNORMAL HIGH (ref 65–99)
Potassium: 4.3 mmol/L (ref 3.5–5.2)
Sodium: 140 mmol/L (ref 134–144)
Total Protein: 6 g/dL (ref 6.0–8.5)

## 2016-09-23 LAB — VITAMIN D 25 HYDROXY (VIT D DEFICIENCY, FRACTURES): Vit D, 25-Hydroxy: 9.3 ng/mL — ABNORMAL LOW (ref 30.0–100.0)

## 2016-09-23 LAB — LYME AB/WESTERN BLOT REFLEX
LYME DISEASE AB, QUANT, IGM: <0.8 index (ref 0.00–0.79)
Lyme IgG/IgM Ab: <0.91 {ISR} (ref 0.00–0.90)

## 2016-09-23 LAB — TSH: TSH: 3.64 u[IU]/mL (ref 0.450–4.500)

## 2016-09-23 MED ORDER — VITAMIN D (ERGOCALCIFEROL) 1.25 MG (50000 UNIT) PO CAPS: 50000.0000 [IU] | ORAL_CAPSULE | ORAL | 0 refills | Status: DC

## 2016-09-23 NOTE — Telephone Encounter (Signed)
Call entered in error

## 2016-09-26 ENCOUNTER — Other Ambulatory Visit: Payer: Self-pay

## 2016-09-26 ENCOUNTER — Ambulatory Visit (INDEPENDENT_AMBULATORY_CARE_PROVIDER_SITE_OTHER): Payer: Medicaid Other

## 2016-09-26 DIAGNOSIS — R0600 Dyspnea, unspecified: Secondary | ICD-10-CM | POA: Diagnosis not present

## 2016-09-27 ENCOUNTER — Other Ambulatory Visit: Payer: Self-pay | Admitting: Unknown Physician Specialty

## 2016-09-29 ENCOUNTER — Encounter: Payer: Self-pay | Admitting: Unknown Physician Specialty

## 2016-09-30 ENCOUNTER — Other Ambulatory Visit: Payer: Self-pay | Admitting: Unknown Physician Specialty

## 2016-09-30 MED ORDER — PANTOPRAZOLE SODIUM 40 MG PO TBEC: 40.0000 mg | DELAYED_RELEASE_TABLET | Freq: Every day | ORAL | 1 refills | Status: DC

## 2016-10-03 ENCOUNTER — Encounter: Payer: Self-pay | Admitting: Cardiovascular Disease

## 2016-10-06 ENCOUNTER — Encounter: Payer: Self-pay | Admitting: Unknown Physician Specialty

## 2016-10-10 ENCOUNTER — Encounter: Payer: Self-pay | Admitting: Unknown Physician Specialty

## 2016-10-10 NOTE — Telephone Encounter (Signed)
Routing to provider  

## 2016-10-11 ENCOUNTER — Encounter: Payer: Self-pay | Admitting: Unknown Physician Specialty

## 2016-10-11 ENCOUNTER — Other Ambulatory Visit: Payer: Self-pay | Admitting: Unknown Physician Specialty

## 2016-10-11 MED ORDER — TRAMADOL HCL 50 MG PO TABS: 50.0000 mg | ORAL_TABLET | Freq: Three times a day (TID) | ORAL | 0 refills | Status: DC | PRN

## 2016-10-18 ENCOUNTER — Encounter: Payer: Self-pay | Admitting: Unknown Physician Specialty

## 2016-10-24 ENCOUNTER — Emergency Department (HOSPITAL_COMMUNITY)
Admission: EM | Admit: 2016-10-24 | Discharge: 2016-10-24 | Disposition: A | Discharge status: Home / Self Care | Payer: Medicaid Other | Attending: Emergency Medicine | Admitting: Emergency Medicine

## 2016-10-24 ENCOUNTER — Emergency Department (HOSPITAL_COMMUNITY): Payer: Medicaid Other

## 2016-10-24 DIAGNOSIS — I110 Hypertensive heart disease with heart failure: Secondary | ICD-10-CM | POA: Insufficient documentation

## 2016-10-24 DIAGNOSIS — I2510 Atherosclerotic heart disease of native coronary artery without angina pectoris: Secondary | ICD-10-CM | POA: Diagnosis not present

## 2016-10-24 DIAGNOSIS — E119 Type 2 diabetes mellitus without complications: Secondary | ICD-10-CM | POA: Insufficient documentation

## 2016-10-24 DIAGNOSIS — Z7982 Long term (current) use of aspirin: Secondary | ICD-10-CM | POA: Insufficient documentation

## 2016-10-24 DIAGNOSIS — R0789 Other chest pain: Secondary | ICD-10-CM

## 2016-10-24 DIAGNOSIS — I252 Old myocardial infarction: Secondary | ICD-10-CM | POA: Diagnosis not present

## 2016-10-24 DIAGNOSIS — Z7984 Long term (current) use of oral hypoglycemic drugs: Secondary | ICD-10-CM | POA: Diagnosis not present

## 2016-10-24 DIAGNOSIS — Z955 Presence of coronary angioplasty implant and graft: Secondary | ICD-10-CM | POA: Insufficient documentation

## 2016-10-24 DIAGNOSIS — R079 Chest pain, unspecified: Secondary | ICD-10-CM | POA: Diagnosis present

## 2016-10-24 DIAGNOSIS — I5042 Chronic combined systolic (congestive) and diastolic (congestive) heart failure: Secondary | ICD-10-CM | POA: Diagnosis not present

## 2016-10-24 LAB — URINALYSIS, ROUTINE W REFLEX MICROSCOPIC
Bilirubin Urine: NEGATIVE
Glucose, UA: 250 mg/dL — AB
Ketones, ur: NEGATIVE mg/dL
Nitrite: NEGATIVE
Protein, ur: >300 mg/dL — AB
Specific Gravity, Urine: 1.025 (ref 1.005–1.030)
pH: 5.5 (ref 5.0–8.0)

## 2016-10-24 LAB — HEPATIC FUNCTION PANEL
ALT: 25 U/L (ref 14–54)
AST: 23 U/L (ref 15–41)
Albumin: 2.5 g/dL — ABNORMAL LOW (ref 3.5–5.0)
Alkaline Phosphatase: 107 U/L (ref 38–126)
Bilirubin, Direct: <0.1 mg/dL — ABNORMAL LOW (ref 0.1–0.5)
Total Bilirubin: 0.3 mg/dL (ref 0.3–1.2)
Total Protein: 5.7 g/dL — ABNORMAL LOW (ref 6.5–8.1)

## 2016-10-24 LAB — LIPASE, BLOOD: Lipase: 26 U/L (ref 11–51)

## 2016-10-24 LAB — CBC
HCT: 31.5 % — ABNORMAL LOW (ref 36.0–46.0)
Hemoglobin: 10.5 g/dL — ABNORMAL LOW (ref 12.0–15.0)
MCH: 27.1 pg (ref 26.0–34.0)
MCHC: 33.3 g/dL (ref 30.0–36.0)
MCV: 81.2 fL (ref 78.0–100.0)
Platelets: 296 K/uL (ref 150–400)
RBC: 3.88 MIL/uL (ref 3.87–5.11)
RDW: 13.9 % (ref 11.5–15.5)
WBC: 7 K/uL (ref 4.0–10.5)

## 2016-10-24 LAB — I-STAT TROPONIN, ED
Troponin i, poc: 0 ng/mL (ref 0.00–0.08)
Troponin i, poc: 0 ng/mL (ref 0.00–0.08)

## 2016-10-24 LAB — BASIC METABOLIC PANEL WITH GFR
Anion gap: 5 (ref 5–15)
BUN: 21 mg/dL — ABNORMAL HIGH (ref 6–20)
CO2: 22 mmol/L (ref 22–32)
Calcium: 8.3 mg/dL — ABNORMAL LOW (ref 8.9–10.3)
Chloride: 110 mmol/L (ref 101–111)
Creatinine, Ser: 1.3 mg/dL — ABNORMAL HIGH (ref 0.44–1.00)
GFR calc Af Amer: >60 mL/min
GFR calc non Af Amer: 52 mL/min — ABNORMAL LOW
Glucose, Bld: 186 mg/dL — ABNORMAL HIGH (ref 65–99)
Potassium: 4.2 mmol/L (ref 3.5–5.1)
Sodium: 137 mmol/L (ref 135–145)

## 2016-10-24 LAB — I-STAT BETA HCG BLOOD, ED (MC, WL, AP ONLY): I-stat hCG, quantitative: <5 m[IU]/mL

## 2016-10-24 LAB — URINE MICROSCOPIC-ADD ON

## 2016-10-24 LAB — PREGNANCY, URINE: Preg Test, Ur: NEGATIVE

## 2016-10-24 MED ORDER — ASPIRIN 81 MG PO CHEW: 324.0000 mg | CHEWABLE_TABLET | Freq: Once | ORAL | Status: DC

## 2016-10-24 MED ORDER — PROCHLORPERAZINE EDISYLATE 5 MG/ML IJ SOLN
10.0000 mg | Freq: Once | INTRAMUSCULAR | Status: AC
  Administered 2016-10-24: 10 mg via INTRAVENOUS
  Filled 2016-10-24: qty 2

## 2016-10-24 NOTE — ED Triage Notes (Signed)
Pt. CO of chest pain that started today around 4 with pain radiating to L shoulder. HTN, blurred vision, nausea, and ABD pain. Hy. Of MI and 2 stents last year

## 2016-10-24 NOTE — ED Notes (Signed)
RN requested urine sample; pt stated she did not have to go at this time; pt states she has hx of kidney disease and rarely goes anymore

## 2016-10-24 NOTE — ED Provider Notes (Signed)
11:18 PM Patient care assumed from [facility], PA-C at shift change. Plan discussed which includes discharge if delta troponin negative. UA and repeat troponin reviewed; reassuring. UA c/w contaminated specimen and is similar to prior testing. Original and delta troponin both 0.00.  Results reviewed with the patient who verbalizes understanding. She is comfortable with d/c and outpatient management. Primary care and cardiology follow-up advised. Return precautions given. Patient discharged in stable condition with no unaddressed concerns.   Antonietta Breach, PA-C 10/24/16 [id number]    Dorie Rank, MD 10/26/16 [phone]

## 2016-10-24 NOTE — ED Provider Notes (Signed)
[facility] DEPT Provider Note   CSN: [id number] Arrival date & time: 10/24/16  1750     History   Chief Complaint Chief Complaint  Patient presents with  . Chest Pain    HPI Kimara Bencomo is a 35 y.o. female with a past medical history significant for CHF, DM, CAD status post 2 stent placement, NSTEMI, HTN who presents to the ED today complaining of chest pain. Patient states that when she woke up this morning she was feeling fatigue which she states is normal for her. She began having left shoulder pain that then switch to her right shoulder and then spontaneously resolved. She has also expressed similar symptoms like this in the past that she was previously told chest thoracic outlet syndrome. However, around 4 PM she began having a stabbing pain in the center of her chest. She reported associated left arm tingling sensation and heaviness as well as dizziness and nausea. Patient also had some shortness of breath. No associated diaphoresis or loss of consciousness. Patient states symptoms lasted for approximately 20-30 minutes. She took 2 home nitroglycerin which did not seem to help her symptoms. Patient states she does get chest pain often but states this feels different than normal. She states her typical chest pain is a dull ache and this has been sharp. Since she's been in the ED she states the pain has come and gone. She is not actively complaining of chest pain that states that when she got here and felt more like it "squeezing sensation in her chest". Patient also is experiencing epigastric and left upper quadrant pain that is also intermittent in nature and she describes it as a sharp pain as well. She denies any dysuria, diarrhea, vomiting.  HPI  Past Medical History:  Diagnosis Date  . Chronic systolic CHF (congestive heart failure) ([place])    a. echo 03/2015: EF 30-35%, mild concentric LVH, severe HK of inf, inflat, & lat walls, mod MR, mild TR;  b. 04/2015 LV Gram: EF 55-65%.    . Diabetes type 2, controlled ([place])    a. since 2002   . Heavy menses    a. H/O IUD - expired in 2014 - remains in place.  . Hyperlipidemia   . Hypertension   . Iron deficiency anemia   . Ischemic cardiomyopathy    a. 03/2015 EF 30-35% post NSTEMI;  b. 04/2015 EF 55-65% on LV gram.  . Kidney disease   . MI (myocardial infarction) 03/29/2015  . Obesity   . Renal insufficiency   . Tobacco abuse    a. quit 03/2015.  Marland Kitchen Uterine fibroid     Patient Active Problem List   Diagnosis Date Noted  . Urinary tract infection 09/20/2016  . HTN (hypertension) 09/05/2016  . Vertigo 06/21/2016  . Elevated liver function tests 06/05/2016  . Chronic diastolic heart failure ([place]) 03/19/2016  . Tachycardia 03/19/2016  . IBS (irritable bowel syndrome) 12/15/2015  . Proteinuria 08/30/2015  . Coronary artery disease involving native coronary artery of native heart with angina pectoris with documented spasm ([place])   . Angina pectoris ([place])   . Iron deficiency anemia   . Heavy menses   . SOB (shortness of breath) 08/21/2015  . Hyperlipidemia 04/13/2015  . Ischemic cardiomyopathy   . Diabetes type 2, controlled ([place])   . Obesity     Past Surgical History:  Procedure Laterality Date  . CARDIAC CATHETERIZATION  4/16   x2 stent [facility]  . CARDIAC CATHETERIZATION N/A 05/05/2015   Procedure:  Left Heart Cath and Coronary Angiography;  Surgeon: Peter M Martinique, MD;  Location: [place] [facility] CV LAB;  Service: Cardiovascular;  Laterality: N/A;  . COLONOSCOPY WITH PROPOFOL N/A 07/31/2016   Procedure: COLONOSCOPY WITH PROPOFOL;  Surgeon: Lollie Sails, MD;  Location: [facility] ENDOSCOPY;  Service: Endoscopy;  Laterality: N/A;  . ESOPHAGOGASTRODUODENOSCOPY (EGD) WITH PROPOFOL N/A 07/31/2016   Procedure: ESOPHAGOGASTRODUODENOSCOPY (EGD) WITH PROPOFOL;  Surgeon: Lollie Sails, MD;  Location: [facility] ENDOSCOPY;  Service: Endoscopy;  Laterality: N/A;  . LAPAROSCOPIC APPENDECTOMY N/A 08/07/2016   Procedure: APPENDECTOMY  LAPAROSCOPIC;  Surgeon: Hubbard Robinson, MD;  Location: [facility] ORS;  Service: General;  Laterality: N/A;  . MOUTH SURGERY      OB History    Gravida Para Term Preterm AB Living   3       2 1    SAB TAB Ectopic Multiple Live Births   2               Home Medications    Prior to Admission medications   Medication Sig Start Date End Date Taking? Authorizing Provider  aspirin EC 81 MG tablet Take 81 mg by mouth daily.   Yes Historical Provider, MD  benzonatate (TESSALON) 100 MG capsule Take 1 capsule (100 mg total) by mouth 2 (two) times daily as needed for cough. 08/29/16  Yes Kathrine Haddock, NP  dicyclomine (BENTYL) 20 MG tablet Take 20 mg by mouth 4 (four) times daily as needed for spasms.  06/28/16 06/28/17 Yes Historical Provider, MD  etonogestrel (NEXPLANON) 68 MG IMPL implant 1 each by Subdermal route once.   Yes Historical Provider, MD  ferrous sulfate 325 (65 FE) MG tablet Take 1 tablet (325 mg total) by mouth 3 (three) times daily with meals. Patient taking differently: Take 325 mg by mouth daily with breakfast.  03/06/16  Yes Kathrine Haddock, NP  glimepiride (AMARYL) 4 MG tablet Take 1 tablet (4 mg total) by mouth daily. 09/09/16  Yes Kathrine Haddock, NP  meclizine (ANTIVERT) 32 MG tablet Take 1 tablet (32 mg total) by mouth 3 (three) times daily as needed. Patient taking differently: Take 32 mg by mouth 3 (three) times daily as needed for dizziness or nausea.  06/21/16  Yes Kathrine Haddock, NP  Melatonin 5 MG TABS Take 5 mg by mouth at bedtime as needed (for sleep).    Yes Historical Provider, MD  metoprolol tartrate (LOPRESSOR) 25 MG tablet Take 1 tablet (25 mg total) by mouth 2 (two) times daily. 06/27/16  Yes Wellington Hampshire, MD  nitroGLYCERIN (NITROSTAT) 0.4 MG SL tablet Place 1 tablet (0.4 mg total) under the tongue every 5 (five) minutes as needed for chest pain. Reported on 02/20/2016 03/11/16  Yes Wellington Hampshire, MD  pantoprazole (PROTONIX) 40 MG tablet Take 1 tablet (40 mg total) by  mouth daily at 6 (six) AM. 09/30/16  Yes Kathrine Haddock, NP  sitaGLIPtin (JANUVIA) 100 MG tablet Take 1 tablet (100 mg total) by mouth daily. 07/01/16  Yes Kathrine Haddock, NP  traMADol (ULTRAM) 50 MG tablet Take 1 tablet (50 mg total) by mouth every 8 (eight) hours as needed. Patient taking differently: Take 50 mg by mouth every 8 (eight) hours as needed for moderate pain.  10/11/16  Yes Kathrine Haddock, NP  venlafaxine XR (EFFEXOR-XR) 37.5 MG 24 hr capsule Take 75 mg by mouth daily with breakfast.    Yes Historical Provider, MD  ciprofloxacin (CIPRO) 250 MG tablet Take 1 tablet (250 mg total) by mouth 2 (two)  times daily. Patient not taking: Reported on 10/24/2016 09/20/16   Kathrine Haddock, NP  glucose blood (ACCU-CHEK ACTIVE STRIPS) test strip Use as instructed Patient not taking: Reported on 10/24/2016 03/25/16   Kathrine Haddock, NP  ibuprofen (ADVIL,MOTRIN) 800 MG tablet Take 1 tablet (800 mg total) by mouth every 8 (eight) hours as needed. Patient not taking: Reported on 10/24/2016 08/08/16   Hubbard Robinson, MD  loperamide (IMODIUM) 2 MG capsule Take 2 mg by mouth as needed for diarrhea or loose stools.    Historical Provider, MD  Vitamin D, Ergocalciferol, (DRISDOL) 50000 units CAPS capsule Take 1 capsule (50,000 Units total) by mouth every 7 (seven) days. Patient not taking: Reported on 10/24/2016 09/23/16   Kathrine Haddock, NP    Family History Family History  Problem Relation Age of Onset  . Diabetes Father   . Cancer Maternal Grandmother     lung  . Cancer Maternal Grandfather     prostate  . Diabetes Paternal Grandfather   . Cancer - Cervical Maternal Aunt     Social History Social History  Substance Use Topics  . Smoking status: Former Smoker    Years: 15.00    Quit date: 03/10/2015  . Smokeless tobacco: Never Used  . Alcohol use No     Allergies   Lisinopril; Rosemary oil; Shellfish allergy; and Metformin and related   Review of Systems Review of Systems  All other  systems reviewed and are negative.    Physical Exam Updated Vital Signs BP 158/78   Pulse 76   Resp 15   SpO2 100%   Physical Exam  Constitutional: She is oriented to person, place, and time. She appears well-developed and well-nourished. No distress.  HENT:  Head: Normocephalic and atraumatic.  Mouth/Throat: No oropharyngeal exudate.  Eyes: Conjunctivae and EOM are normal. Pupils are equal, round, and reactive to light. Right eye exhibits no discharge. Left eye exhibits no discharge. No scleral icterus.  Cardiovascular: Normal rate, regular rhythm, normal heart sounds and intact distal pulses.  Exam reveals no gallop and no friction rub.   No murmur heard. Pulmonary/Chest: Effort normal and breath sounds normal. No respiratory distress. She has no wheezes. She has no rales. She exhibits no tenderness.  Abdominal: Soft. Bowel sounds are normal. She exhibits no distension. There is tenderness ( TTP of epigastric and left upper quadrant area). There is no guarding.  Musculoskeletal: Normal range of motion. She exhibits no edema.  Neurological: She is alert and oriented to person, place, and time.  Strength 5/5 throughout. No sensory deficits. No gait abnormality. No dysmetria. No slurred speech. No facial droop.   Skin: Skin is warm and dry. No rash noted. She is not diaphoretic. No erythema. No pallor.  Psychiatric: She has a normal mood and affect. Her behavior is normal.  Nursing note and vitals reviewed.    ED Treatments / Results  Labs (all labs ordered are listed, but only abnormal results are displayed) Labs Reviewed  BASIC METABOLIC PANEL  CBC  HEPATIC FUNCTION PANEL  LIPASE, BLOOD  URINALYSIS, ROUTINE W REFLEX MICROSCOPIC (NOT AT [facility])  PREGNANCY, URINE  I-STAT [place], ED    EKG  EKG Interpretation  Date/Time:  Thursday October 24 2016 18:13:02 EST Ventricular Rate:  81 PR Interval:    QRS Duration: 86 QT Interval:  371 QTC Calculation: 431 R  Axis:   67 Text Interpretation:  Sinus rhythm Borderline short PR interval No significant change since last tracing Confirmed by KNAPP  MD-J,  JON [phone]) on 10/24/2016 6:39:40 PM       Radiology Dg Chest 2 View  Result Date: 10/24/2016 CLINICAL DATA:  Subacute onset of generalized chest pain, radiating to left shoulder. Blurred vision, nausea and abdominal pain. Initial encounter. EXAM: CHEST  2 VIEW COMPARISON:  Chest radiograph performed 07/20/2016, and CTA of the chest performed 08/05/2016 FINDINGS: The lungs are well-aerated and clear. There is no evidence of focal opacification, pleural effusion or pneumothorax. The heart is normal in size; the mediastinal contour is within normal limits. No acute osseous abnormalities are seen. IMPRESSION: No acute cardiopulmonary process seen. Electronically Signed   By: Garald Balding M.D.   On: 10/24/2016 19:09    Procedures Procedures (including critical care time)  Medications Ordered in ED Medications  aspirin chewable tablet 324 mg (324 mg Oral Not Given 10/24/16 1838)  prochlorperazine (COMPAZINE) injection 10 mg (not administered)     Initial Impression / Assessment and Plan / ED Course  I have reviewed the triage vital signs and the nursing notes.  Pertinent labs & imaging results that were available during my care of the patient were reviewed by me and considered in my medical decision making (see chart for details).  Clinical Course     35 year old female with history of CAD, status post 2 stent placement, CHF, DM presents the ED today complaining of chest pain around 4 PM. Patient had 4 sublingual nitroglycerin) as well as 324 mg of aspirin. On presentation to ED, patient appears very anxious and is otherwise nontoxic and nonseptic appearing. Vitals are stable. She has mild TTP of the epigastrium and left upper quadrant area. Abdomen is soft without rigidity. Patient is complaining of severe headache which she states she experiences  daily. Chest x-ray is normal. All lab work unremarkable. Creatinine is 1.3 which appears to be her baseline. She was given Compazine for headache which significantly improved her symptoms. EKG and troponin wnl. Upon reevaluation, patient is completely asymptomatic. As discussed with patient and observation versus delta troponin and had a long discussion of risks versus benefits. Patient states that she will stay for the delta troponin within like to discharged. She has close follow-up with cardiology can see them as an outpatient. I feel that this is reasonable given clinical history and presentation. She has been seen in the ED multiple times for chest pain since her NSTEMI and has had large negative workups including CTA chest.  Patient signed out to [employer] PA-C at shift change pending a repeat troponin at 11 PM.  Case discussed with Dr. Tomi Bamberger who agrees with treatment plan.  Final Clinical Impressions(s) / ED Diagnoses   Final diagnoses:  Atypical chest pain    New Prescriptions New Prescriptions   No medications on file     Carlos Levering, PA-C 10/24/16 2047    Dorie Rank, MD 10/26/16 2141

## 2016-10-24 NOTE — ED Notes (Signed)
Pt changing into gown

## 2016-10-25 ENCOUNTER — Encounter: Payer: Self-pay | Admitting: Cardiovascular Disease

## 2016-10-25 ENCOUNTER — Other Ambulatory Visit: Payer: Self-pay

## 2016-10-25 ENCOUNTER — Encounter: Payer: Self-pay | Admitting: Unknown Physician Specialty

## 2016-10-25 ENCOUNTER — Ambulatory Visit (INDEPENDENT_AMBULATORY_CARE_PROVIDER_SITE_OTHER): Payer: Medicaid Other | Admitting: Unknown Physician Specialty

## 2016-10-25 DIAGNOSIS — M791 Myalgia, unspecified site: Secondary | ICD-10-CM | POA: Insufficient documentation

## 2016-10-25 DIAGNOSIS — E559 Vitamin D deficiency, unspecified: Secondary | ICD-10-CM

## 2016-10-25 DIAGNOSIS — F5101 Primary insomnia: Secondary | ICD-10-CM | POA: Diagnosis not present

## 2016-10-25 DIAGNOSIS — G4700 Insomnia, unspecified: Secondary | ICD-10-CM | POA: Insufficient documentation

## 2016-10-25 MED ORDER — METOPROLOL TARTRATE 25 MG PO TABS: 25.0000 mg | ORAL_TABLET | Freq: Two times a day (BID) | ORAL | 0 refills | Status: DC

## 2016-10-25 MED ORDER — SITAGLIPTIN PHOSPHATE 100 MG PO TABS: 100.0000 mg | ORAL_TABLET | Freq: Every day | ORAL | 2 refills | Status: DC

## 2016-10-25 MED ORDER — VITAMIN D (ERGOCALCIFEROL) 1.25 MG (50000 UNIT) PO CAPS: 50000.0000 [IU] | ORAL_CAPSULE | ORAL | 0 refills | Status: DC

## 2016-10-25 MED ORDER — AMITRIPTYLINE HCL 10 MG PO TABS: 10.0000 mg | ORAL_TABLET | Freq: Every day | ORAL | 2 refills | Status: DC

## 2016-10-25 NOTE — Assessment & Plan Note (Addendum)
Stop melatonin.  Start Amitriptyline QHS

## 2016-10-25 NOTE — Assessment & Plan Note (Addendum)
Lots of pain that seems muscular.  I think it is multi-factoral.  Will do some inflammatory labs and work on sleep and Vitamin D.  Question statin myalgias

## 2016-10-25 NOTE — Progress Notes (Signed)
BP 135/85 (BP Location: Left Arm, Patient Position: Sitting, Cuff Size: Large)   Pulse 80   Temp 98.8 F (37.1 C)   Wt 217 lb 12.8 oz (98.8 kg)   SpO2 98%   BMI 37.39 kg/m    Subjective:    Patient ID: [name] [name], female    DOB: 12-06-1981, 35 y.o.   MRN: [id number]  HPI: [name] [name] is a 35 y.o. female  Chief Complaint  Patient presents with  . Pain    pt states she has been having pain in various places, hips, legs, back, arms, and constant headache.    Pt states she is having a lot of pain.  She describes low back pain with radiation down her legs.  States she has left sided sharp pain that comes and goes.  Has a headache for 3 weeks.  States increased pain for 2 weeks.    Went to the ER last night for sharp chest pain and the stomach pain started when they sat her down.    States she sleeps poorly.  Falls asleep about 4 and up at 7a.  She occasionally takes a nap.  Sometimes she sleeps all day.    Describes brain fog  She was taking Tramadol every 8 hours and it wasn't helping.   Rash- random and itchy rash that seems to come and go   Vit D deficiency 9.3 in the ER.  Only took one month worth  Relevant past medical, surgical, family and social history reviewed and updated as indicated. Interim medical history since our last visit reviewed. Allergies and medications reviewed and updated.  Review of Systems  Per HPI unless specifically indicated above     Objective:    BP 135/85 (BP Location: Left Arm, Patient Position: Sitting, Cuff Size: Large)   Pulse 80   Temp 98.8 F (37.1 C)   Wt 217 lb 12.8 oz (98.8 kg)   SpO2 98%   BMI 37.39 kg/m   Wt Readings from Last 3 Encounters:  10/25/16 217 lb 12.8 oz (98.8 kg)  09/20/16 215 lb (97.5 kg)  09/05/16 209 lb (94.8 kg)    Physical Exam  Constitutional: She is oriented to person, place, and time. She appears well-developed and well-nourished. No distress.  HENT:  Head: Normocephalic and atraumatic.    Eyes: Conjunctivae and lids are normal. Right eye exhibits no discharge. Left eye exhibits no discharge. No scleral icterus.  Neck: Normal range of motion. Neck supple. No JVD present. Carotid bruit is not present.  Cardiovascular: Normal rate, regular rhythm and normal heart sounds.   Pulmonary/Chest: Effort normal and breath sounds normal.  Abdominal: Normal appearance. There is no splenomegaly or hepatomegaly.  Musculoskeletal: Normal range of motion.  Neurological: She is alert and oriented to person, place, and time.  Skin: Skin is warm, dry and intact. No rash noted. No pallor.  Psychiatric: She has a normal mood and affect. Her behavior is normal. Judgment and thought content normal.    Results for orders placed or performed during the hospital encounter of 02/54/27  Basic metabolic panel  Result Value Ref Range   Sodium 137 135 - 145 mmol/L   Potassium 4.2 3.5 - 5.1 mmol/L   Chloride 110 101 - 111 mmol/L   CO2 22 22 - 32 mmol/L   Glucose, Bld 186 (H) 65 - 99 mg/dL   BUN 21 (H) 6 - 20 mg/dL   Creatinine, Ser 1.30 (H) 0.44 - 1.00 mg/dL   Calcium 8.3 (  L) 8.9 - 10.3 mg/dL   GFR calc non Af Amer 52 (L) >60 mL/min   GFR calc Af Amer >60 >60 mL/min   Anion gap 5 5 - 15  CBC  Result Value Ref Range   WBC 7.0 4.0 - 10.5 K/uL   RBC 3.88 3.87 - 5.11 MIL/uL   Hemoglobin 10.5 (L) 12.0 - 15.0 g/dL   HCT 31.5 (L) 36.0 - 46.0 %   MCV 81.2 78.0 - 100.0 fL   MCH 27.1 26.0 - 34.0 pg   MCHC 33.3 30.0 - 36.0 g/dL   RDW 13.9 11.5 - 15.5 %   Platelets 296 150 - 400 K/uL  Hepatic function panel  Result Value Ref Range   Total Protein 5.7 (L) 6.5 - 8.1 g/dL   Albumin 2.5 (L) 3.5 - 5.0 g/dL   AST 23 15 - 41 U/L   ALT 25 14 - 54 U/L   Alkaline Phosphatase 107 38 - 126 U/L   Total Bilirubin 0.3 0.3 - 1.2 mg/dL   Bilirubin, Direct <0.1 (L) 0.1 - 0.5 mg/dL   Indirect Bilirubin NOT CALCULATED 0.3 - 0.9 mg/dL  Lipase, blood  Result Value Ref Range   Lipase 26 11 - 51 U/L  Urinalysis,  Routine w reflex microscopic  Result Value Ref Range   Color, Urine AMBER (A) YELLOW   APPearance CLOUDY (A) CLEAR   Specific Gravity, Urine 1.025 1.005 - 1.030   pH 5.5 5.0 - 8.0   Glucose, UA 250 (A) NEGATIVE mg/dL   Hgb urine dipstick LARGE (A) NEGATIVE   Bilirubin Urine NEGATIVE NEGATIVE   Ketones, ur NEGATIVE NEGATIVE mg/dL   Protein, ur >300 (A) NEGATIVE mg/dL   Nitrite NEGATIVE NEGATIVE   Leukocytes, UA SMALL (A) NEGATIVE  Pregnancy, urine  Result Value Ref Range   Preg Test, Ur NEGATIVE NEGATIVE  Urine microscopic-add on  Result Value Ref Range   Squamous Epithelial / LPF 6-30 (A) NONE SEEN   WBC, UA 6-30 0 - 5 WBC/hpf   RBC / HPF 6-30 0 - 5 RBC/hpf   Bacteria, UA FEW (A) NONE SEEN   Urine-Other MUCOUS PRESENT   I-stat troponin, ED  Result Value Ref Range   Troponin i, poc 0.00 0.00 - 0.08 ng/mL   Comment 3          I-Stat beta hCG blood, ED  Result Value Ref Range   I-stat hCG, quantitative <5.0 <5 mIU/mL   Comment 3          I-stat troponin, ED  Result Value Ref Range   Troponin i, poc 0.00 0.00 - 0.08 ng/mL   Comment 3              Assessment & Plan:   Problem List Items Addressed This Visit      Unprioritized   Insomnia    Stop melatonin.  Start Amitriptyline QHS      Myalgic    Lots of pain that seems muscular.  I think it is multi-factoral.  Will do some inflammatory labs and work on sleep and Vitamin D.  Question statin myalgias      Relevant Orders   Sed Rate (ESR)   C-reactive protein   CK (Creatine Kinase)   Vitamin D deficiency    Restart Vitamin D          Follow up plan: Return in about 4 weeks (around 11/22/2016).

## 2016-10-25 NOTE — Assessment & Plan Note (Signed)
Restart Vitamin D.  

## 2016-10-26 LAB — CK: Total CK: 361 U/L — ABNORMAL HIGH (ref 24–173)

## 2016-10-26 LAB — C-REACTIVE PROTEIN: CRP: 5.8 mg/L — ABNORMAL HIGH (ref 0.0–4.9)

## 2016-10-26 LAB — SEDIMENTATION RATE: Sed Rate: 37 mm/hr — ABNORMAL HIGH (ref 0–32)

## 2016-10-28 ENCOUNTER — Other Ambulatory Visit: Payer: Self-pay | Admitting: Unknown Physician Specialty

## 2016-10-28 ENCOUNTER — Encounter: Payer: Self-pay | Admitting: Unknown Physician Specialty

## 2016-10-28 MED ORDER — DULAGLUTIDE 0.75 MG/0.5ML ~~LOC~~ SOAJ: 0.7500 mg | SUBCUTANEOUS | 3 refills | Status: DC

## 2016-10-28 NOTE — Telephone Encounter (Signed)
Routing to provider  

## 2016-10-29 NOTE — Telephone Encounter (Signed)
Routing to provider  

## 2016-10-30 ENCOUNTER — Other Ambulatory Visit: Payer: Self-pay | Admitting: Unknown Physician Specialty

## 2016-10-30 MED ORDER — ONDANSETRON HCL 4 MG PO TABS: 4.0000 mg | ORAL_TABLET | Freq: Three times a day (TID) | ORAL | 0 refills | Status: DC | PRN

## 2016-11-04 ENCOUNTER — Encounter: Payer: Self-pay | Admitting: Unknown Physician Specialty

## 2016-11-05 ENCOUNTER — Ambulatory Visit (INDEPENDENT_AMBULATORY_CARE_PROVIDER_SITE_OTHER): Payer: Medicaid Other | Admitting: Unknown Physician Specialty

## 2016-11-05 ENCOUNTER — Encounter: Payer: Self-pay | Admitting: Unknown Physician Specialty

## 2016-11-05 VITALS — BP 143/89 | HR 94 | Temp 98.2°F | Wt 220.2 lb

## 2016-11-05 DIAGNOSIS — J0100 Acute maxillary sinusitis, unspecified: Secondary | ICD-10-CM | POA: Diagnosis not present

## 2016-11-05 DIAGNOSIS — R05 Cough: Secondary | ICD-10-CM

## 2016-11-05 DIAGNOSIS — R059 Cough, unspecified: Secondary | ICD-10-CM

## 2016-11-05 MED ORDER — DOXYCYCLINE HYCLATE 100 MG PO TABS: 100.0000 mg | ORAL_TABLET | Freq: Two times a day (BID) | ORAL | 0 refills | Status: DC

## 2016-11-05 MED ORDER — GUAIFENESIN-CODEINE 100-10 MG/5ML PO SOLN: 10.0000 mL | Freq: Three times a day (TID) | ORAL | 0 refills | Status: DC | PRN

## 2016-11-05 NOTE — Progress Notes (Signed)
BP (!) 143/89 (BP Location: Left Arm, Patient Position: Sitting, Cuff Size: Large)   Pulse 94   Temp 98.2 F (36.8 C)   Wt 220 lb 3.2 oz (99.9 kg)   SpO2 100%   BMI 37.80 kg/m    Subjective:    Patient ID: [name] [name], female    DOB: 05/12/1981, 35 y.o.   MRN: [id number]  HPI: [name] [name] is a 35 y.o. female  Chief Complaint  Patient presents with  . URI    pt states she has had a cough, sore throat, stuffy nose, SOB, chest congestion, dizziness, and runny nose. States symptoms started a little over a week ago.    URI   This is a new problem. Episode onset: 10 day. The problem has been gradually worsening. The maximum temperature recorded prior to her arrival was 103 - 104 F (for one day). Associated symptoms include congestion, coughing, joint pain, rhinorrhea and a sore throat. Associated symptoms comments: Dizzy and light headed. She has tried acetaminophen for the symptoms. The treatment provided no relief.    Relevant past medical, surgical, family and social history reviewed and updated as indicated. Interim medical history since our last visit reviewed. Allergies and medications reviewed and updated.  Review of Systems  HENT: Positive for congestion, rhinorrhea and sore throat.   Respiratory: Positive for cough.   Musculoskeletal: Positive for joint pain.    Per HPI unless specifically indicated above     Objective:    BP (!) 143/89 (BP Location: Left Arm, Patient Position: Sitting, Cuff Size: Large)   Pulse 94   Temp 98.2 F (36.8 C)   Wt 220 lb 3.2 oz (99.9 kg)   SpO2 100%   BMI 37.80 kg/m   Wt Readings from Last 3 Encounters:  11/05/16 220 lb 3.2 oz (99.9 kg)  10/25/16 217 lb 12.8 oz (98.8 kg)  09/20/16 215 lb (97.5 kg)    Physical Exam  Constitutional: She is oriented to person, place, and time. She appears well-developed and well-nourished. No distress.  HENT:  Head: Normocephalic and atraumatic.  Right Ear: Tympanic membrane and ear canal  normal.  Left Ear: Tympanic membrane and ear canal normal.  Nose: No rhinorrhea. Right sinus exhibits maxillary sinus tenderness. Right sinus exhibits no frontal sinus tenderness. Left sinus exhibits maxillary sinus tenderness. Left sinus exhibits no frontal sinus tenderness.  Eyes: Conjunctivae and lids are normal. Right eye exhibits no discharge. Left eye exhibits no discharge. No scleral icterus.  Cardiovascular: Normal rate and regular rhythm.   Pulmonary/Chest: Effort normal and breath sounds normal. No respiratory distress.  Abdominal: Normal appearance. There is no splenomegaly or hepatomegaly.  Musculoskeletal: Normal range of motion.  Neurological: She is alert and oriented to person, place, and time.  Skin: Skin is intact. No rash noted. No pallor.  Psychiatric: She has a normal mood and affect. Her behavior is normal. Judgment and thought content normal.    Results for orders placed or performed in visit on 10/25/16  Sed Rate (ESR)  Result Value Ref Range   Sed Rate 37 (H) 0 - 32 mm/hr  C-reactive protein  Result Value Ref Range   CRP 5.8 (H) 0.0 - 4.9 mg/L  CK (Creatine Kinase)  Result Value Ref Range   Total CK 361 (H) 24 - 173 U/L      Assessment & Plan:   Problem List Items Addressed This Visit    None    Visit Diagnoses    Acute non-recurrent maxillary  sinusitis    -  Primary   Relevant Medications   doxycycline (VIBRA-TABS) 100 MG tablet   guaiFENesin-codeine 100-10 MG/5ML syrup       Follow up plan: Return in about 3 months (around 02/05/2017).

## 2016-11-06 ENCOUNTER — Encounter: Payer: Self-pay | Admitting: Unknown Physician Specialty

## 2016-11-08 ENCOUNTER — Ambulatory Visit: Payer: Medicaid Other | Admitting: Cardiovascular Disease

## 2016-11-08 ENCOUNTER — Encounter: Payer: Self-pay | Admitting: Cardiovascular Disease

## 2016-11-08 ENCOUNTER — Ambulatory Visit (INDEPENDENT_AMBULATORY_CARE_PROVIDER_SITE_OTHER): Payer: Medicaid Other | Admitting: Cardiovascular Disease

## 2016-11-08 VITALS — BP 128/84 | HR 96 | Ht 64.0 in | Wt 220.8 lb

## 2016-11-08 DIAGNOSIS — I25119 Atherosclerotic heart disease of native coronary artery with unspecified angina pectoris: Secondary | ICD-10-CM | POA: Diagnosis not present

## 2016-11-08 DIAGNOSIS — I10 Essential (primary) hypertension: Secondary | ICD-10-CM

## 2016-11-08 DIAGNOSIS — I255 Ischemic cardiomyopathy: Secondary | ICD-10-CM

## 2016-11-08 DIAGNOSIS — E785 Hyperlipidemia, unspecified: Secondary | ICD-10-CM | POA: Diagnosis not present

## 2016-11-08 MED ORDER — ROSUVASTATIN CALCIUM 5 MG PO TABS
5.0000 mg | ORAL_TABLET | Freq: Every day | ORAL | 5 refills | Status: DC
Start: 2016-11-08 — End: 2016-12-11

## 2016-11-08 MED ORDER — ISOSORBIDE MONONITRATE ER 30 MG PO TB24: 30.0000 mg | ORAL_TABLET | Freq: Every day | ORAL | 5 refills | Status: DC

## 2016-11-08 NOTE — Patient Instructions (Signed)
Medication Instructions:  Your physician has recommended you make the following change in your medication:  START taking imdur 30mg  once daily START taking rosuvastatin 5mg  once daily   Labwork: Fasting liver and lipid profile and CPK in 1 month. Nothing to eat or drink after midnight the evening before your labs.   Testing/Procedures: none  Follow-Up: Your physician wants you to follow-up in: 6 months with Dr. Fletcher Anon.  You will receive a reminder letter in the mail two months in advance. If you don't receive a letter, please call our office to schedule the follow-up appointment.   Any Other Special Instructions Will Be Listed Below (If Applicable).     If you need a refill on your cardiac medications before your next appointment, please call your pharmacy.

## 2016-11-08 NOTE — Progress Notes (Signed)
Cardiology Office Note   Date:  11/08/2016   ID:  [name], [name] [name] 25, 1982, MRN [id number]  PCP:  [name] Haddock, NP  Cardiologist:   [name] Sacramento, MD   Chief Complaint  Patient presents with  . Other    C/o frequent chest pain. Meds reviewed verbally with pt.      History of Present Illness: [name] [name] is a 35 y.o. female who presents for a follow-up visit regarding coronary artery disease and chronic systolic heart failure.  She has prolonged history of type 2 diabetes since 2002 , prolonged history of tobacco use, and obesity.  She presented to [facility] in April of 2016 with NSTEMI. Echocardiogram showed an ejection fraction of 30% with severe inferior, inferolateral and lateral wall hypokinesis with moderate mitral regurgitation. Cardiac catheterization showed occluded mid left circumflex with significant disease affecting the proximal OM1. There was mild disease affecting the LAD and minor irregularities in the RCA. Ejection fraction was 35%. I performed successful angioplasty and drug-eluting stent placement to the mid LCX and proximal OM . She underwent a repeat cardiac catheterization in May 2016 which showed patent stents in the left circumflex and OM1. Ejection fraction was 50-55%.  She was hospitalized at [facility] in March, 2017 for chest pain thought to be due to panic attacks. She ruled out for myocardial infarction. She underwent a nuclear stress test showed evidence of prior left circumflex infarct without ischemia with normal ejection fraction.  She had sinus tachycardia that improved after switching carvedilol to metoprolol. She did not tolerate atorvastatin due to significant myalgia. She has been off atorvastatin but she continued to have myalgias and she had recent labs done which showed mildly elevated CPK as well as mildly elevated sedimentation rate and CRP.  She has chronic chest pain almost on a daily basis that happens both at rest and with physical  activities. She thinks it might be related to anxiety and panic attacks. She had recent upper respiratory tract infection which improved with doxycycline.    Past Medical History:  Diagnosis Date  . Chronic systolic CHF (congestive heart failure) ([place])    a. echo 03/2015: EF 30-35%, mild concentric LVH, severe HK of inf, inflat, & lat walls, mod MR, mild TR;  b. 04/2015 LV Gram: EF 55-65%.  . Diabetes type 2, controlled ([place])    a. since 2002   . Heavy menses    a. H/O IUD - expired in 2014 - remains in place.  . Hyperlipidemia   . Hypertension   . Iron deficiency anemia   . Ischemic cardiomyopathy    a. 03/2015 EF 30-35% post NSTEMI;  b. 04/2015 EF 55-65% on LV gram.  . Kidney disease   . MI (myocardial infarction) 03/29/2015  . Obesity   . Renal insufficiency   . Tobacco abuse    a. quit 03/2015.  [name] [name] Uterine fibroid     Past Surgical History:  Procedure Laterality Date  . CARDIAC CATHETERIZATION  4/16   x2 stent [facility]  . CARDIAC CATHETERIZATION N/A 05/05/2015   Procedure: Left Heart Cath and Coronary Angiography;  Surgeon: [name] M Martinique, MD;  Location: [place] CV LAB;  Service: Cardiovascular;  Laterality: N/A;  . COLONOSCOPY WITH PROPOFOL N/A 07/31/2016   Procedure: COLONOSCOPY WITH PROPOFOL;  Surgeon: [name] Sails, MD;  Location: [facility] ENDOSCOPY;  Service: Endoscopy;  Laterality: N/A;  . ESOPHAGOGASTRODUODENOSCOPY (EGD) WITH PROPOFOL N/A 07/31/2016   Procedure: ESOPHAGOGASTRODUODENOSCOPY (EGD) WITH PROPOFOL;  Surgeon: [name] Sails, MD;  Location: [facility] ENDOSCOPY;  Service: Endoscopy;  Laterality: N/A;  . LAPAROSCOPIC APPENDECTOMY N/A 08/07/2016   Procedure: APPENDECTOMY LAPAROSCOPIC;  Surgeon: [name] Robinson, MD;  Location: [facility] ORS;  Service: General;  Laterality: N/A;  . MOUTH SURGERY       Current Outpatient Prescriptions  Medication Sig Dispense Refill  . amitriptyline (ELAVIL) 10 MG tablet Take 1 tablet (10 mg total) by mouth at bedtime. 60 tablet 2  .  aspirin EC 81 MG tablet Take 81 mg by mouth daily.    . benzonatate (TESSALON) 100 MG capsule Take 1 capsule (100 mg total) by mouth 2 (two) times daily as needed for cough. 30 capsule 12  . dicyclomine (BENTYL) 20 MG tablet Take 20 mg by mouth 4 (four) times daily as needed for spasms.     [name] [name] doxycycline (VIBRA-TABS) 100 MG tablet Take 1 tablet (100 mg total) by mouth 2 (two) times daily. 20 tablet 0  . Dulaglutide (TRULICITY) 8.29 HB/7.1IR SOPN Inject 0.75 mg into the skin once a week. 4 pen 3  . etonogestrel (NEXPLANON) 68 MG IMPL implant 1 each by Subdermal route once.    . ferrous sulfate 325 (65 FE) MG tablet Take 1 tablet (325 mg total) by mouth 3 (three) times daily with meals. (Patient taking differently: Take 325 mg by mouth daily with breakfast. ) 90 tablet 3  . glimepiride (AMARYL) 4 MG tablet Take 1 tablet (4 mg total) by mouth daily. 90 tablet 1  . glucose blood (ACCU-CHEK ACTIVE STRIPS) test strip Use as instructed 100 each 12  . guaiFENesin-codeine 100-10 MG/5ML syrup Take 10 mLs by mouth 3 (three) times daily as needed for cough. 120 mL 0  . loperamide (IMODIUM) 2 MG capsule Take 2 mg by mouth as needed for diarrhea or loose stools.    . meclizine (ANTIVERT) 32 MG tablet Take 1 tablet (32 mg total) by mouth 3 (three) times daily as needed. (Patient taking differently: Take 32 mg by mouth 3 (three) times daily as needed for dizziness or nausea. ) 30 tablet 0  . metoprolol tartrate (LOPRESSOR) 25 MG tablet Take 1 tablet (25 mg total) by mouth 2 (two) times daily. 60 tablet 0  . nitroGLYCERIN (NITROSTAT) 0.4 MG SL tablet Place 1 tablet (0.4 mg total) under the tongue every 5 (five) minutes as needed for chest pain. Reported on 02/20/2016 25 tablet 3  . ondansetron (ZOFRAN) 4 MG tablet Take 1 tablet (4 mg total) by mouth every 8 (eight) hours as needed for nausea or vomiting. 20 tablet 0  . pantoprazole (PROTONIX) 40 MG tablet Take 1 tablet (40 mg total) by mouth daily at 6 (six) AM. 90  tablet 1  . sitaGLIPtin (JANUVIA) 100 MG tablet Take 1 tablet (100 mg total) by mouth daily. 30 tablet 2  . traMADol (ULTRAM) 50 MG tablet Take 1 tablet (50 mg total) by mouth every 8 (eight) hours as needed. 30 tablet 0  . venlafaxine XR (EFFEXOR-XR) 75 MG 24 hr capsule Take 75 mg by mouth daily with breakfast.    . Vitamin D, Ergocalciferol, (DRISDOL) 50000 units CAPS capsule Take 1 capsule (50,000 Units total) by mouth every 7 (seven) days. 12 capsule 0  . isosorbide mononitrate (IMDUR) 30 MG 24 hr tablet Take 1 tablet (30 mg total) by mouth daily. 30 tablet 5  . rosuvastatin (CRESTOR) 5 MG tablet Take 1 tablet (5 mg total) by mouth daily. 30 tablet 5   No current facility-administered medications for this visit.  Allergies:   Lisinopril; Rosemary oil; Shellfish allergy; and Metformin and related    Social History:  The patient  reports that she quit smoking about 20 months ago. She quit after 15.00 years of use. She has never used smokeless tobacco. She reports that she does not drink alcohol or use drugs.   Family History:  The patient's family history includes Cancer in her maternal grandfather and maternal grandmother; Cancer - Cervical in her maternal aunt; Diabetes in her father and paternal grandfather.    ROS:  Please see the history of present illness.   Otherwise, review of systems are positive for none.   All other systems are reviewed and negative.    PHYSICAL EXAM: VS:  BP 128/84 (BP Location: Left Arm, Patient Position: Sitting, Cuff Size: Large)   Pulse 96   Ht 5\' 4"  (1.626 m)   Wt 220 lb 12 oz (100.1 kg)   BMI 37.89 kg/m  , BMI Body mass index is 37.89 kg/m. GEN: Well nourished, well developed, in no acute distress HEENT: normal Neck: no JVD, carotid bruits, or masses Cardiac:Mildly tachycardic; no murmurs, rubs, or gallops,no edema  Respiratory:  clear to auscultation bilaterally, normal work of breathing GI: soft, nontender, nondistended, + BS MS: no  deformity or atrophy Skin: warm and dry, no rash Neuro:  Strength and sensation are intact Psych: euthymic mood, full affect   EKG:  EKG is  ordered today. EKG showed normal sinus rhythm with no significant ST or T wave changes.   Recent Labs: 02/13/2016: B Natriuretic Peptide 127.0 08/06/2016: Magnesium 1.5 09/20/2016: TSH 3.640 10/24/2016: ALT 25; BUN 21; Creatinine, Ser 1.30; Hemoglobin 10.5; Platelets 296; Potassium 4.2; Sodium 137    Lipid Panel    Component Value Date/Time   CHOL 244 (H) 09/02/2016 1006   CHOL 106 08/30/2015 1439   CHOL 133 03/31/2015 0225   TRIG 120 09/02/2016 1006   TRIG 46 08/30/2015 1439   TRIG 77 03/31/2015 0225   HDL 58 09/02/2016 1006   HDL 26 (L) 03/31/2015 0225   CHOLHDL 2.9 05/16/2015 1101   VLDL 9 08/30/2015 1439   VLDL 15 03/31/2015 0225   LDLCALC 162 (H) 09/02/2016 1006   LDLCALC 92 03/31/2015 0225      Wt Readings from Last 3 Encounters:  11/08/16 220 lb 12 oz (100.1 kg)  11/05/16 220 lb 3.2 oz (99.9 kg)  10/25/16 217 lb 12.8 oz (98.8 kg)        ASSESSMENT AND PLAN:  1.  Coronary artery disease involving native coronary arteries with other forms of angina:  I elected to add small dose Imdur 30 mg once daily to her medications. I discussed side effects with her.  2. Chronic systolic heart failure: Ejection fraction improved to normal.  No need for diuretics.  Tachycardia improved after switching carvedilol to metoprolol.   3. Essential hypertension: Blood pressure is controlled.  4. Hyperlipidemia:   She had myalgia with mildly elevated CPK and atorvastatin and also had abnormal liver function tests. Liver enzymes went back to normal. I reviewed most recent lipid profile with her done in September which is concerning with an LDL of 162. History of coronary artery disease and diabetes, we have to try to get her LDL below 70. I elected to start small dose rosuvastatin 5 mg once daily. Check fasting lipid, liver profile and CPK in one  month.   Disposition:   FU with me in 3 months  Signed,  [name] Sacramento, MD  11/08/2016  11:48 AM    [facility]

## 2016-11-19 ENCOUNTER — Encounter: Payer: Self-pay | Admitting: Cardiovascular Disease

## 2016-11-22 ENCOUNTER — Ambulatory Visit: Payer: Medicaid Other | Admitting: Unknown Physician Specialty

## 2016-11-25 ENCOUNTER — Other Ambulatory Visit: Payer: Self-pay

## 2016-11-25 ENCOUNTER — Encounter: Payer: Self-pay | Admitting: Cardiovascular Disease

## 2016-11-25 ENCOUNTER — Encounter: Payer: Self-pay | Admitting: Unknown Physician Specialty

## 2016-11-25 MED ORDER — ONDANSETRON HCL 4 MG PO TABS: 4.0000 mg | ORAL_TABLET | Freq: Three times a day (TID) | ORAL | 0 refills | Status: DC | PRN

## 2016-11-25 MED ORDER — METOPROLOL TARTRATE 25 MG PO TABS: 25.0000 mg | ORAL_TABLET | Freq: Two times a day (BID) | ORAL | 3 refills | Status: DC

## 2016-12-03 ENCOUNTER — Ambulatory Visit: Payer: Medicaid Other | Admitting: Unknown Physician Specialty

## 2016-12-05 ENCOUNTER — Encounter: Payer: Self-pay | Admitting: Unknown Physician Specialty

## 2016-12-07 DIAGNOSIS — K0889 Other specified disorders of teeth and supporting structures: Secondary | ICD-10-CM | POA: Diagnosis not present

## 2016-12-07 DIAGNOSIS — E1122 Type 2 diabetes mellitus with diabetic chronic kidney disease: Secondary | ICD-10-CM | POA: Diagnosis not present

## 2016-12-07 DIAGNOSIS — R42 Dizziness and giddiness: Secondary | ICD-10-CM | POA: Diagnosis present

## 2016-12-07 DIAGNOSIS — I130 Hypertensive heart and chronic kidney disease with heart failure and stage 1 through stage 4 chronic kidney disease, or unspecified chronic kidney disease: Secondary | ICD-10-CM | POA: Diagnosis not present

## 2016-12-07 DIAGNOSIS — Z7984 Long term (current) use of oral hypoglycemic drugs: Secondary | ICD-10-CM | POA: Diagnosis not present

## 2016-12-07 DIAGNOSIS — I5022 Chronic systolic (congestive) heart failure: Secondary | ICD-10-CM | POA: Diagnosis not present

## 2016-12-07 DIAGNOSIS — E860 Dehydration: Secondary | ICD-10-CM | POA: Diagnosis not present

## 2016-12-07 DIAGNOSIS — N189 Chronic kidney disease, unspecified: Secondary | ICD-10-CM | POA: Diagnosis not present

## 2016-12-07 DIAGNOSIS — Z79899 Other long term (current) drug therapy: Secondary | ICD-10-CM | POA: Insufficient documentation

## 2016-12-07 DIAGNOSIS — Z87891 Personal history of nicotine dependence: Secondary | ICD-10-CM | POA: Insufficient documentation

## 2016-12-08 ENCOUNTER — Emergency Department
Admission: EM | Admit: 2016-12-08 | Discharge: 2016-12-08 | Disposition: A | Discharge status: Home / Self Care | Payer: Medicaid Other | Attending: Emergency Medicine | Admitting: Emergency Medicine

## 2016-12-08 ENCOUNTER — Encounter: Payer: Self-pay | Admitting: Emergency Medicine

## 2016-12-08 ENCOUNTER — Emergency Department: Payer: Medicaid Other

## 2016-12-08 DIAGNOSIS — R42 Dizziness and giddiness: Secondary | ICD-10-CM

## 2016-12-08 DIAGNOSIS — K0889 Other specified disorders of teeth and supporting structures: Secondary | ICD-10-CM

## 2016-12-08 DIAGNOSIS — E860 Dehydration: Secondary | ICD-10-CM

## 2016-12-08 LAB — CBC
HCT: 33.2 % — ABNORMAL LOW (ref 35.0–47.0)
Hemoglobin: 11.3 g/dL — ABNORMAL LOW (ref 12.0–16.0)
MCH: 27.2 pg (ref 26.0–34.0)
MCHC: 33.9 g/dL (ref 32.0–36.0)
MCV: 80.3 fL (ref 80.0–100.0)
Platelets: 400 10*3/uL (ref 150–440)
RBC: 4.13 MIL/uL (ref 3.80–5.20)
RDW: 13.7 % (ref 11.5–14.5)
WBC: 9.3 10*3/uL (ref 3.6–11.0)

## 2016-12-08 LAB — BASIC METABOLIC PANEL
Anion gap: 5 (ref 5–15)
BUN: 24 mg/dL — ABNORMAL HIGH (ref 6–20)
CO2: 24 mmol/L (ref 22–32)
Calcium: 8.6 mg/dL — ABNORMAL LOW (ref 8.9–10.3)
Chloride: 111 mmol/L (ref 101–111)
Creatinine, Ser: 1.3 mg/dL — ABNORMAL HIGH (ref 0.44–1.00)
GFR calc Af Amer: >60 mL/min (ref >60)
GFR calc non Af Amer: 52 mL/min — ABNORMAL LOW (ref >60)
Glucose, Bld: 99 mg/dL (ref 65–99)
Potassium: 3.5 mmol/L (ref 3.5–5.1)
Sodium: 140 mmol/L (ref 135–145)

## 2016-12-08 LAB — TROPONIN I: Troponin I: <0.03 ng/mL (ref <0.03)

## 2016-12-08 MED ORDER — ONDANSETRON HCL 4 MG/2ML IJ SOLN
4.0000 mg | Freq: Once | INTRAMUSCULAR | Status: AC
  Administered 2016-12-08: 4 mg via INTRAVENOUS
  Filled 2016-12-08: qty 2

## 2016-12-08 MED ORDER — AMOXICILLIN 500 MG PO CAPS: 500.0000 mg | ORAL_CAPSULE | Freq: Three times a day (TID) | ORAL | 0 refills | Status: DC

## 2016-12-08 MED ORDER — DIAZEPAM 2 MG PO TABS: 2.0000 mg | ORAL_TABLET | Freq: Three times a day (TID) | ORAL | 0 refills | Status: DC | PRN

## 2016-12-08 MED ORDER — ONDANSETRON 4 MG PO TBDP
4.0000 mg | ORAL_TABLET | Freq: Three times a day (TID) | ORAL | 0 refills | Status: DC | PRN
Start: 2016-12-08 — End: 2017-05-16

## 2016-12-08 MED ORDER — SODIUM CHLORIDE 0.9 % IV BOLUS (SEPSIS)
500.0000 mL | Freq: Once | INTRAVENOUS | Status: AC
  Administered 2016-12-08: 500 mL via INTRAVENOUS

## 2016-12-08 MED ORDER — DIAZEPAM 2 MG PO TABS
2.0000 mg | ORAL_TABLET | Freq: Once | ORAL | Status: AC
Start: 2016-12-08 — End: 2016-12-08
  Administered 2016-12-08: 2 mg via ORAL
  Filled 2016-12-08: qty 1

## 2016-12-08 MED ORDER — AMOXICILLIN 500 MG PO CAPS
500.0000 mg | ORAL_CAPSULE | Freq: Once | ORAL | Status: AC
  Administered 2016-12-08: 500 mg via ORAL
  Filled 2016-12-08: qty 1

## 2016-12-08 NOTE — Discharge Instructions (Signed)
1. Take antibiotic as prescribed (amoxicillin 500 mg 3 times daily 7 days). 2. Take medicines as needed for dizziness and nausea (Valium/Zofran #15). 3. Return to the ER for worsening symptoms, persistent vomiting, difficulty breathing or other concerns.

## 2016-12-08 NOTE — ED Notes (Signed)
Patient with complaint of dizziness, nausea and toothache times two days. Patient reports that she has a history of vertigo but this dizziness is different.

## 2016-12-08 NOTE — ED Triage Notes (Addendum)
Pt c/o dizziness for 3 days accompanied by head pressure; pt says she has a history of vertigo but this feels different; she has dizziness all the time, even at rest; c/o nausea with vomiting x 2, last time 10pm; pt awake and alert; talking in complete coherent sentences; denies visual changes

## 2016-12-08 NOTE — ED Provider Notes (Signed)
[facility] Emergency Department Provider Note   ____________________________________________   First MD Initiated Contact with Patient 12/08/16 [phone]     (approximate)  I have reviewed the triage vital signs and the nursing notes.   HISTORY  Chief Complaint Dizziness and Headache    HPI [name] [name] is a 35 y.o. female who presents to the ED from home with a chief complaint of dizziness. Patient complains of dizziness for the past 3 days associated with head pressure. Reports a history of vertigo and states this feels both like her vertigo as well as lightheadedness. Symptoms are worsened with head movements; nausea/vomiting 2 last evening.Patient seems to be more concerned about her toothache which has been ongoing for "a while"; increased over the past several days. States her gums have been swollen at times. Denies associated fever, headache, neck pain, vision changes, chest pain, shortness of breath, diarrhea. Denies recent travel or trauma. Nothing makes her symptoms better.   Past Medical History:  Diagnosis Date  . Chronic systolic CHF (congestive heart failure) ([place])    a. echo 03/2015: EF 30-35%, mild concentric LVH, severe HK of inf, inflat, & lat walls, mod MR, mild TR;  b. 04/2015 LV Gram: EF 55-65%.  . Diabetes type 2, controlled ([place])    a. since 2002   . Heavy menses    a. H/O IUD - expired in 2014 - remains in place.  . Hyperlipidemia   . Hypertension   . Iron deficiency anemia   . Ischemic cardiomyopathy    a. 03/2015 EF 30-35% post NSTEMI;  b. 04/2015 EF 55-65% on LV gram.  . Kidney disease   . MI (myocardial infarction) 03/29/2015  . Obesity   . Renal insufficiency   . Tobacco abuse    a. quit 03/2015.  Marland Kitchen Uterine fibroid     Patient Active Problem List   Diagnosis Date Noted  . Myalgic 10/25/2016  . Vitamin D deficiency 10/25/2016  . Insomnia 10/25/2016  . Urinary tract infection 09/20/2016  . HTN (hypertension) 09/05/2016    . Vertigo 06/21/2016  . Elevated liver function tests 06/05/2016  . Chronic diastolic heart failure ([place]) 03/19/2016  . Tachycardia 03/19/2016  . IBS (irritable bowel syndrome) 12/15/2015  . Proteinuria 08/30/2015  . Coronary artery disease involving native coronary artery of native heart with angina pectoris with documented spasm ([place])   . Angina pectoris ([place])   . Iron deficiency anemia   . Heavy menses   . SOB (shortness of breath) 08/21/2015  . Hyperlipidemia 04/13/2015  . Ischemic cardiomyopathy   . Diabetes type 2, controlled ([place])   . Obesity     Past Surgical History:  Procedure Laterality Date  . APPENDECTOMY    . CARDIAC CATHETERIZATION  4/16   x2 stent [facility]  . CARDIAC CATHETERIZATION N/A 05/05/2015   Procedure: Left Heart Cath and Coronary Angiography;  Surgeon: Peter M Martinique, MD;  Location: [place] CV LAB;  Service: Cardiovascular;  Laterality: N/A;  . COLONOSCOPY WITH PROPOFOL N/A 07/31/2016   Procedure: COLONOSCOPY WITH PROPOFOL;  Surgeon: Lollie Sails, MD;  Location: [facility] ENDOSCOPY;  Service: Endoscopy;  Laterality: N/A;  . ESOPHAGOGASTRODUODENOSCOPY (EGD) WITH PROPOFOL N/A 07/31/2016   Procedure: ESOPHAGOGASTRODUODENOSCOPY (EGD) WITH PROPOFOL;  Surgeon: Lollie Sails, MD;  Location: [facility] ENDOSCOPY;  Service: Endoscopy;  Laterality: N/A;  . LAPAROSCOPIC APPENDECTOMY N/A 08/07/2016   Procedure: APPENDECTOMY LAPAROSCOPIC;  Surgeon: Hubbard Robinson, MD;  Location: [facility] ORS;  Service: General;  Laterality: N/A;  .  MOUTH SURGERY      Prior to Admission medications   Medication Sig Start Date End Date Taking? Authorizing Provider  amitriptyline (ELAVIL) 10 MG tablet Take 1 tablet (10 mg total) by mouth at bedtime. 10/25/16  Yes Kathrine Haddock, NP  aspirin EC 81 MG tablet Take 81 mg by mouth daily.   Yes Historical Provider, MD  dicyclomine (BENTYL) 20 MG tablet Take 20 mg by mouth 4 (four) times daily as needed for spasms.  06/28/16 06/28/17 Yes Historical  Provider, MD  Dulaglutide (TRULICITY) 3.32 RJ/1.8AC SOPN Inject 0.75 mg into the skin once a week. 10/28/16  Yes Kathrine Haddock, NP  etonogestrel (NEXPLANON) 68 MG IMPL implant 1 each by Subdermal route once.   Yes Historical Provider, MD  ferrous sulfate 325 (65 FE) MG tablet Take 1 tablet (325 mg total) by mouth 3 (three) times daily with meals. Patient taking differently: Take 325 mg by mouth daily with breakfast.  03/06/16  Yes Kathrine Haddock, NP  isosorbide mononitrate (IMDUR) 30 MG 24 hr tablet Take 1 tablet (30 mg total) by mouth daily. 11/08/16 02/06/17 Yes Wellington Hampshire, MD  loperamide (IMODIUM) 2 MG capsule Take 2 mg by mouth as needed for diarrhea or loose stools.   Yes Historical Provider, MD  metoprolol tartrate (LOPRESSOR) 25 MG tablet Take 1 tablet (25 mg total) by mouth 2 (two) times daily. 11/25/16  Yes Wellington Hampshire, MD  nitroGLYCERIN (NITROSTAT) 0.4 MG SL tablet Place 1 tablet (0.4 mg total) under the tongue every 5 (five) minutes as needed for chest pain. Reported on 02/20/2016 03/11/16  Yes Wellington Hampshire, MD  ondansetron (ZOFRAN) 4 MG tablet Take 1 tablet (4 mg total) by mouth every 8 (eight) hours as needed for nausea or vomiting. 11/25/16  Yes Kathrine Haddock, NP  pantoprazole (PROTONIX) 40 MG tablet Take 1 tablet (40 mg total) by mouth daily at 6 (six) AM. 09/30/16  Yes Kathrine Haddock, NP  rosuvastatin (CRESTOR) 5 MG tablet Take 1 tablet (5 mg total) by mouth daily. 11/08/16 02/06/17 Yes Wellington Hampshire, MD  sitaGLIPtin (JANUVIA) 100 MG tablet Take 1 tablet (100 mg total) by mouth daily. 10/25/16  Yes Kathrine Haddock, NP  venlafaxine XR (EFFEXOR-XR) 75 MG 24 hr capsule Take 75 mg by mouth daily with breakfast.   Yes Historical Provider, MD  Vitamin D, Ergocalciferol, (DRISDOL) 50000 units CAPS capsule Take 1 capsule (50,000 Units total) by mouth every 7 (seven) days. 10/25/16  Yes Kathrine Haddock, NP  benzonatate (TESSALON) 100 MG capsule Take 1 capsule (100 mg total) by mouth 2 (two)  times daily as needed for cough. Patient not taking: Reported on 12/08/2016 08/29/16   Kathrine Haddock, NP  doxycycline (VIBRA-TABS) 100 MG tablet Take 1 tablet (100 mg total) by mouth 2 (two) times daily. Patient not taking: Reported on 12/08/2016 11/05/16   Kathrine Haddock, NP  glimepiride (AMARYL) 4 MG tablet Take 1 tablet (4 mg total) by mouth daily. 09/09/16   Kathrine Haddock, NP  glucose blood (ACCU-CHEK ACTIVE STRIPS) test strip Use as instructed 03/25/16   Kathrine Haddock, NP  guaiFENesin-codeine 100-10 MG/5ML syrup Take 10 mLs by mouth 3 (three) times daily as needed for cough. Patient not taking: Reported on 12/08/2016 11/05/16   Kathrine Haddock, NP  meclizine (ANTIVERT) 32 MG tablet Take 1 tablet (32 mg total) by mouth 3 (three) times daily as needed. Patient taking differently: Take 32 mg by mouth 3 (three) times daily as needed for dizziness or nausea.  06/21/16  Kathrine Haddock, NP  traMADol (ULTRAM) 50 MG tablet Take 1 tablet (50 mg total) by mouth every 8 (eight) hours as needed. 10/11/16   Kathrine Haddock, NP    Allergies Lisinopril; Rosemary oil; Shellfish allergy; and Metformin and related  Family History  Problem Relation Age of Onset  . Diabetes Father   . Cancer Maternal Grandmother     lung  . Cancer Maternal Grandfather     prostate  . Diabetes Paternal Grandfather   . Cancer - Cervical Maternal Aunt     Social History Social History  Substance Use Topics  . Smoking status: Former Smoker    Years: 15.00    Quit date: 03/10/2015  . Smokeless tobacco: Never Used  . Alcohol use No    Review of Systems  Constitutional: No fever/chills. Eyes: No visual changes. ENT: Positive for toothache. No sore throat. Cardiovascular: Denies chest pain. Respiratory: Denies shortness of breath. Gastrointestinal: No abdominal pain.  No nausea, no vomiting.  No diarrhea.  No constipation. Genitourinary: Negative for dysuria. Musculoskeletal: Negative for back pain. Skin: Negative for  rash. Neurological: Positive for dizziness. Negative for headaches, focal weakness or numbness.  10-point ROS otherwise negative.  ____________________________________________   PHYSICAL EXAM:  VITAL SIGNS: ED Triage Vitals  Enc Vitals Group     BP 12/08/16 0006 (!) 147/98     Pulse Rate 12/08/16 0006 (!) 110     Resp 12/08/16 0006 18     Temp 12/08/16 0006 98.6 F (37 C)     Temp Source 12/08/16 0006 Oral     SpO2 12/08/16 0006 100 %     Weight 12/08/16 0007 219 lb (99.3 kg)     Height 12/08/16 0007 5\' 4"  (1.626 m)     Head Circumference --      Peak Flow --      Pain Score 12/08/16 0007 6     Pain Loc --      Pain Edu? --      Excl. in [place]? --     Constitutional: Alert and oriented. Well appearing and in no acute distress. Eyes: Conjunctivae are normal. PERRL. EOMI. Head: Atraumatic. Nose: No congestion/rhinnorhea. Mouth/Throat: Mucous membranes are moist.  Oropharynx non-erythematous.  Right anterior molar tender to palpation with tongue blade. No intraoral or extraoral swelling noted. Neck: No stridor.  No carotid bruits. Cardiovascular: Normal rate, regular rhythm. Grossly normal heart sounds.  Good peripheral circulation. Respiratory: Normal respiratory effort.  No retractions. Lungs CTAB. Gastrointestinal: Soft and nontender. No distention. No abdominal bruits. No CVA tenderness. Musculoskeletal: No lower extremity tenderness nor edema.  No joint effusions. Neurologic:  Alert and oriented 4. Normal speech and language. No gross focal neurologic deficits are appreciated. MAEx4. No gait instability. Skin:  Skin is warm, dry and intact. No rash noted. Psychiatric: Mood and affect are normal. Speech and behavior are normal.  ____________________________________________   LABS (all labs ordered are listed, but only abnormal results are displayed)  Labs Reviewed  BASIC METABOLIC PANEL - Abnormal; Notable for the following:       Result Value   BUN 24 (*)     Creatinine, Ser 1.30 (*)    Calcium 8.6 (*)    GFR calc non Af Amer 52 (*)    All other components within normal limits  CBC - Abnormal; Notable for the following:    Hemoglobin 11.3 (*)    HCT 33.2 (*)    All other components within normal limits  TROPONIN I  URINALYSIS, COMPLETE (UACMP) WITH MICROSCOPIC  CBG MONITORING, ED   ____________________________________________  EKG  ED ECG REPORT I, Jurnei Latini J, the attending physician, personally viewed and interpreted this ECG.   Date: 12/08/2016  EKG Time: 0008  Rate: 110  Rhythm: Sinus tachycardia  Axis: Normal  Intervals:none  ST&T Change: Nonspecific  ____________________________________________  RADIOLOGY  CT head interpreted per Dr. Dorann Lodge: Minimal atherosclerosis. Otherwise negative CT HEAD. ____________________________________________   PROCEDURES  Procedure(s) performed: None  Procedures  Critical Care performed: No  ____________________________________________   INITIAL IMPRESSION / ASSESSMENT AND PLAN / ED COURSE  Pertinent labs & imaging results that were available during my care of the patient were reviewed by me and considered in my medical decision making (see chart for details).  35 year old female who presents with dizziness. Laboratory imaging results are unremarkable except mild renal insufficiency which appears baseline for patient. Will obtain orthostatic vital signs, infuse IV fluids, Valium and Zofran given for dizziness, amoxicillin given for dental infection.  Clinical Course as of Dec 08 644  Nancy Fetter Dec 08, 2016  9458 Orthostatics within normal limits. Dizziness improved. Will write limited prescriptions for Valium, Zofran; amoxicillin for dental infection. Strict return precautions given. Patient verbalizes understanding and agrees with plan of care.  [JS]    Clinical Course User Index [JS] Paulette Blanch, MD     ____________________________________________   FINAL CLINICAL  IMPRESSION(S) / ED DIAGNOSES  Final diagnoses:  Dehydration  Dizziness  Toothache      NEW MEDICATIONS STARTED DURING THIS VISIT:  New Prescriptions   No medications on file     Note:  This document was prepared using Dragon voice recognition software and may include unintentional dictation errors.    Paulette Blanch, MD 12/08/16 [phone]

## 2016-12-10 ENCOUNTER — Other Ambulatory Visit (INDEPENDENT_AMBULATORY_CARE_PROVIDER_SITE_OTHER): Payer: Medicaid Other

## 2016-12-10 DIAGNOSIS — E785 Hyperlipidemia, unspecified: Secondary | ICD-10-CM

## 2016-12-11 ENCOUNTER — Encounter: Payer: Self-pay | Admitting: Cardiovascular Disease

## 2016-12-11 ENCOUNTER — Other Ambulatory Visit: Payer: Self-pay

## 2016-12-11 DIAGNOSIS — R7989 Other specified abnormal findings of blood chemistry: Secondary | ICD-10-CM

## 2016-12-11 DIAGNOSIS — R945 Abnormal results of liver function studies: Secondary | ICD-10-CM

## 2016-12-11 DIAGNOSIS — E785 Hyperlipidemia, unspecified: Secondary | ICD-10-CM

## 2016-12-11 LAB — HEPATIC FUNCTION PANEL
ALT: 41 IU/L — ABNORMAL HIGH (ref 0–32)
AST: 34 IU/L (ref 0–40)
Albumin: 3 g/dL — ABNORMAL LOW (ref 3.5–5.5)
Alkaline Phosphatase: 132 IU/L — ABNORMAL HIGH (ref 39–117)
Bilirubin Total: <0.2 mg/dL (ref 0.0–1.2)
Bilirubin, Direct: 0.03 mg/dL (ref 0.00–0.40)
Total Protein: 6.3 g/dL (ref 6.0–8.5)

## 2016-12-11 LAB — LIPID PANEL
Chol/HDL Ratio: 3.9 ratio units (ref 0.0–4.4)
Cholesterol, Total: 165 mg/dL (ref 100–199)
HDL: 42 mg/dL (ref >39)
LDL Calculated: 99 mg/dL (ref 0–99)
Triglycerides: 120 mg/dL (ref 0–149)
VLDL Cholesterol Cal: 24 mg/dL (ref 5–40)

## 2016-12-11 LAB — CK: Total CK: 359 U/L — ABNORMAL HIGH (ref 24–173)

## 2016-12-11 MED ORDER — ROSUVASTATIN CALCIUM 5 MG PO TABS: 5.0000 mg | ORAL_TABLET | ORAL | 5 refills | Status: DC

## 2016-12-16 ENCOUNTER — Ambulatory Visit: Payer: Medicaid Other | Attending: Family | Admitting: Family

## 2016-12-16 ENCOUNTER — Encounter: Payer: Self-pay | Admitting: Family

## 2016-12-16 VITALS — BP 141/94 | HR 103 | Resp 18 | Ht 64.0 in | Wt 228.0 lb

## 2016-12-16 DIAGNOSIS — Z91018 Allergy to other foods: Secondary | ICD-10-CM | POA: Insufficient documentation

## 2016-12-16 DIAGNOSIS — Z8042 Family history of malignant neoplasm of prostate: Secondary | ICD-10-CM | POA: Diagnosis not present

## 2016-12-16 DIAGNOSIS — I10 Essential (primary) hypertension: Secondary | ICD-10-CM

## 2016-12-16 DIAGNOSIS — Z801 Family history of malignant neoplasm of trachea, bronchus and lung: Secondary | ICD-10-CM | POA: Insufficient documentation

## 2016-12-16 DIAGNOSIS — I252 Old myocardial infarction: Secondary | ICD-10-CM | POA: Insufficient documentation

## 2016-12-16 DIAGNOSIS — E559 Vitamin D deficiency, unspecified: Secondary | ICD-10-CM | POA: Diagnosis not present

## 2016-12-16 DIAGNOSIS — I5032 Chronic diastolic (congestive) heart failure: Secondary | ICD-10-CM | POA: Insufficient documentation

## 2016-12-16 DIAGNOSIS — Z8049 Family history of malignant neoplasm of other genital organs: Secondary | ICD-10-CM | POA: Insufficient documentation

## 2016-12-16 DIAGNOSIS — I255 Ischemic cardiomyopathy: Secondary | ICD-10-CM | POA: Insufficient documentation

## 2016-12-16 DIAGNOSIS — I208 Other forms of angina pectoris: Secondary | ICD-10-CM | POA: Insufficient documentation

## 2016-12-16 DIAGNOSIS — Z833 Family history of diabetes mellitus: Secondary | ICD-10-CM | POA: Insufficient documentation

## 2016-12-16 DIAGNOSIS — Z888 Allergy status to other drugs, medicaments and biological substances status: Secondary | ICD-10-CM | POA: Diagnosis not present

## 2016-12-16 DIAGNOSIS — E669 Obesity, unspecified: Secondary | ICD-10-CM | POA: Insufficient documentation

## 2016-12-16 DIAGNOSIS — I110 Hypertensive heart disease with heart failure: Secondary | ICD-10-CM | POA: Diagnosis not present

## 2016-12-16 DIAGNOSIS — E785 Hyperlipidemia, unspecified: Secondary | ICD-10-CM | POA: Diagnosis not present

## 2016-12-16 DIAGNOSIS — N289 Disorder of kidney and ureter, unspecified: Secondary | ICD-10-CM | POA: Insufficient documentation

## 2016-12-16 DIAGNOSIS — Z7982 Long term (current) use of aspirin: Secondary | ICD-10-CM | POA: Diagnosis not present

## 2016-12-16 DIAGNOSIS — Z91013 Allergy to seafood: Secondary | ICD-10-CM | POA: Diagnosis not present

## 2016-12-16 DIAGNOSIS — R5383 Other fatigue: Secondary | ICD-10-CM | POA: Insufficient documentation

## 2016-12-16 DIAGNOSIS — Z87891 Personal history of nicotine dependence: Secondary | ICD-10-CM | POA: Diagnosis not present

## 2016-12-16 DIAGNOSIS — R5382 Chronic fatigue, unspecified: Secondary | ICD-10-CM

## 2016-12-16 DIAGNOSIS — E119 Type 2 diabetes mellitus without complications: Secondary | ICD-10-CM | POA: Diagnosis not present

## 2016-12-16 DIAGNOSIS — Z955 Presence of coronary angioplasty implant and graft: Secondary | ICD-10-CM | POA: Diagnosis not present

## 2016-12-16 DIAGNOSIS — I214 Non-ST elevation (NSTEMI) myocardial infarction: Secondary | ICD-10-CM | POA: Diagnosis not present

## 2016-12-16 DIAGNOSIS — E1122 Type 2 diabetes mellitus with diabetic chronic kidney disease: Secondary | ICD-10-CM

## 2016-12-16 DIAGNOSIS — D649 Anemia, unspecified: Secondary | ICD-10-CM | POA: Insufficient documentation

## 2016-12-16 DIAGNOSIS — N181 Chronic kidney disease, stage 1: Secondary | ICD-10-CM

## 2016-12-16 DIAGNOSIS — I209 Angina pectoris, unspecified: Secondary | ICD-10-CM

## 2016-12-16 LAB — TSH: TSH: 3.243 u[IU]/mL (ref 0.350–4.500)

## 2016-12-16 NOTE — Progress Notes (Signed)
Patient ID: [name] [name], female    DOB: [birth year], 36 y.o.   MRN: [id number]  HPI  Ms Kubota is a 36 y/o female with a history of diabetes, hyperlipidemia, HTN, anemia, renal insufficiency, MI and heart failure with preserved ejection fraction.   Was in the ED on 12/08/16 for dizziness and dehydration. Was given IV hydration along with amoxicillen for a dental infection. Previous ED visit was 10/24/16 for chest pain. Troponins were negative and she was discharged home with cardiology follow-up. Was admitted 08/05/16 with acute appendicitis. She had a lap appendectomy done on 08/07/16 and was discharged on 08/08/16. She presented to [facility] in April of 2016 with NSTEMI. Echocardiogram showed an ejection fraction of 30% with severe inferior, inferolateral and lateral wall hypokinesis with moderate mitral regurgitation. Cardiac catheterization showed occluded mid left circumflex with significant disease affecting the proximal OM1. There was mild disease affecting the LAD and minor irregularities in the RCA. Ejection fraction was 35%. Successful angioplasty and drug-eluting stent placement to the mid LCX and proximal OM was performed. She underwent a repeat cardiac catheterization in May 2016 which showed patent stents in the left circumflex and OM1. Ejection fraction was 50-55%.   Has had hypotension for which losartan had to be stopped.   She returns today for a follow-up visit. Still not on a diuretic and she feels like she is retaining fluids with weight fluctuations at home. She says that she's gained 5-6 pounds in a few days and then it'll slowly come off. Not adding salt to her food. Does report fatigue and shortness of breath with minimal exertion. She is wondering if her thyroid levels are off because she gets hot flashes and will get sweaty/clammy with very little exertion.   Past Medical History:  Diagnosis Date  . Chronic systolic CHF (congestive heart failure) ([place])    a. echo 03/2015: EF  30-35%, mild concentric LVH, severe HK of inf, inflat, & lat walls, mod MR, mild TR;  b. 04/2015 LV Gram: EF 55-65%.  . Diabetes type 2, controlled ([place])    a. since 2002   . Heavy menses    a. H/O IUD - expired in 2014 - remains in place.  . Hyperlipidemia   . Hypertension   . Iron deficiency anemia   . Ischemic cardiomyopathy    a. 03/2015 EF 30-35% post NSTEMI;  b. 04/2015 EF 55-65% on LV gram.  . Kidney disease   . MI (myocardial infarction) 03/29/2015  . Obesity   . Renal insufficiency   . Tobacco abuse    a. quit 03/2015.  Marland Kitchen Uterine fibroid   . Vertigo   . Vitamin D deficiency    Past Surgical History:  Procedure Laterality Date  . APPENDECTOMY    . CARDIAC CATHETERIZATION  4/16   x2 stent [facility]  . CARDIAC CATHETERIZATION N/A 05/05/2015   Procedure: Left Heart Cath and Coronary Angiography;  Surgeon: Peter M Martinique, MD;  Location: [place] [facility];  Service: Cardiovascular;  Laterality: N/A;  . COLONOSCOPY WITH PROPOFOL N/A 07/31/2016   Procedure: COLONOSCOPY WITH PROPOFOL;  Surgeon: Lollie Sails, MD;  Location: [facility] ENDOSCOPY;  Service: Endoscopy;  Laterality: N/A;  . ESOPHAGOGASTRODUODENOSCOPY (EGD) WITH PROPOFOL N/A 07/31/2016   Procedure: ESOPHAGOGASTRODUODENOSCOPY (EGD) WITH PROPOFOL;  Surgeon: Lollie Sails, MD;  Location: [facility] ENDOSCOPY;  Service: Endoscopy;  Laterality: N/A;  . LAPAROSCOPIC APPENDECTOMY N/A 08/07/2016   Procedure: APPENDECTOMY LAPAROSCOPIC;  Surgeon: Hubbard Robinson, MD;  Location: [facility] ORS;  Service: General;  Laterality: N/A;  . MOUTH SURGERY     Family History  Problem Relation Age of Onset  . Diabetes Father   . Cancer Maternal Grandmother     lung  . Cancer Maternal Grandfather     prostate  . Diabetes Paternal Grandfather   . Cancer - Cervical Maternal Aunt    Social History  Substance Use Topics  . Smoking status: Former Smoker    Years: 15.00    Quit date: 03/10/2015  . Smokeless tobacco: Never Used  . Alcohol use No    Allergies  Allergen Reactions  . Lisinopril Anaphylaxis, Itching and Swelling  . Rosemary Oil Anaphylaxis  . Shellfish Allergy Itching and Swelling  . Metformin And Related Diarrhea, Nausea And Vomiting and Rash   Prior to Admission medications   Medication Sig Start Date End Date Taking? Authorizing Provider  amitriptyline (ELAVIL) 10 MG tablet Take 1 tablet (10 mg total) by mouth at bedtime. 10/25/16  Yes Kathrine Haddock, NP  aspirin EC 81 MG tablet Take 81 mg by mouth daily.   Yes Historical Provider, MD  benzonatate (TESSALON) 100 MG capsule Take 1 capsule (100 mg total) by mouth 2 (two) times daily as needed for cough. 08/29/16  Yes Kathrine Haddock, NP  diazepam (VALIUM) 2 MG tablet Take 1 tablet (2 mg total) by mouth every 8 (eight) hours as needed for muscle spasms. 12/08/16  Yes Paulette Blanch, MD  Dulaglutide (TRULICITY) 8.18 EX/9.3ZJ SOPN Inject 0.75 mg into the skin once a week. 10/28/16  Yes Kathrine Haddock, NP  etonogestrel (NEXPLANON) 68 MG IMPL implant 1 each by Subdermal route once.   Yes Historical Provider, MD  ferrous sulfate 325 (65 FE) MG tablet Take 325 mg by mouth daily with breakfast.   Yes Historical Provider, MD  glimepiride (AMARYL) 4 MG tablet Take 1 tablet (4 mg total) by mouth daily. 09/09/16  Yes Kathrine Haddock, NP  glucose blood (ACCU-CHEK ACTIVE STRIPS) test strip Use as instructed 03/25/16  Yes Kathrine Haddock, NP  isosorbide mononitrate (IMDUR) 30 MG 24 hr tablet Take 1 tablet (30 mg total) by mouth daily. 11/08/16 02/06/17 Yes Wellington Hampshire, MD  loperamide (IMODIUM) 2 MG capsule Take 2 mg by mouth as needed for diarrhea or loose stools.   Yes Historical Provider, MD  meclizine (ANTIVERT) 32 MG tablet Take 1 tablet (32 mg total) by mouth 3 (three) times daily as needed. Patient taking differently: Take 32 mg by mouth 3 (three) times daily as needed for dizziness or nausea.  06/21/16  Yes Kathrine Haddock, NP  metoprolol tartrate (LOPRESSOR) 25 MG tablet Take 1 tablet (25 mg  total) by mouth 2 (two) times daily. 11/25/16  Yes Wellington Hampshire, MD  nitroGLYCERIN (NITROSTAT) 0.4 MG SL tablet Place 1 tablet (0.4 mg total) under the tongue every 5 (five) minutes as needed for chest pain. Reported on 02/20/2016 03/11/16  Yes Wellington Hampshire, MD  ondansetron (ZOFRAN ODT) 4 MG disintegrating tablet Take 1 tablet (4 mg total) by mouth every 8 (eight) hours as needed for nausea or vomiting. 12/08/16  Yes Paulette Blanch, MD  pantoprazole (PROTONIX) 40 MG tablet Take 1 tablet (40 mg total) by mouth daily at 6 (six) AM. 09/30/16  Yes Kathrine Haddock, NP  rosuvastatin (CRESTOR) 5 MG tablet Take 1 tablet (5 mg total) by mouth every other day. 12/11/16 03/11/17 Yes Wende Bushy, MD  sitaGLIPtin (JANUVIA) 100 MG tablet Take 1 tablet (100 mg total) by mouth daily. 10/25/16  Yes  Kathrine Haddock, NP  venlafaxine XR (EFFEXOR-XR) 75 MG 24 hr capsule Take 75 mg by mouth daily with breakfast.   Yes Historical Provider, MD  Vitamin D, Ergocalciferol, (DRISDOL) 50000 units CAPS capsule Take 1 capsule (50,000 Units total) by mouth every 7 (seven) days. 10/25/16  Yes Kathrine Haddock, NP    Review of Systems  Constitutional: Positive for appetite change (decreased), fatigue and fever.  HENT: Negative for congestion, postnasal drip and sore throat.   Eyes: Negative.   Respiratory: Positive for shortness of breath (folding clothes, walking upstairs). Negative for chest tightness and wheezing.   Cardiovascular: Positive for chest pain (at times), palpitations and leg swelling (at the end of the day).  Gastrointestinal: Positive for constipation and diarrhea. Negative for abdominal distention and abdominal pain.  Endocrine: Negative.   Genitourinary: Negative.   Musculoskeletal: Positive for back pain. Negative for neck pain.  Skin: Negative.   Allergic/Immunologic: Negative.   Neurological: Positive for light-headedness (frequently). Negative for dizziness.  Hematological: Negative for adenopathy. Does not  bruise/bleed easily.  Psychiatric/Behavioral: Positive for dysphoric mood. Negative for sleep disturbance and suicidal ideas. The patient is not nervous/anxious.    Vitals:   12/16/16 1001  BP: (!) 141/94  Pulse: (!) 103  Resp: 18  SpO2: 100%  Weight: 228 lb (103.4 kg)  Height: 5\' 4"  (1.626 m)   Wt Readings from Last 3 Encounters:  12/16/16 228 lb (103.4 kg)  12/08/16 219 lb (99.3 kg)  11/08/16 220 lb 12 oz (100.1 kg)   Lab Results  Component Value Date   CREATININE 1.30 (H) 12/08/2016   CREATININE 1.30 (H) 10/24/2016   CREATININE 1.22 (H) 09/20/2016    Physical Exam  Constitutional: She is oriented to person, place, and time. She appears well-developed and well-nourished.  HENT:  Head: Normocephalic and atraumatic.  Eyes: Conjunctivae are normal. Pupils are equal, round, and reactive to light.  Neck: Normal range of motion. Neck supple. No JVD present.  Cardiovascular: Regular rhythm.  Tachycardia present.   Pulmonary/Chest: Effort normal. She has no wheezes. She has no rales.  Abdominal: Soft. She exhibits no distension. There is no tenderness.  Musculoskeletal: Normal range of motion. She exhibits no edema or tenderness.  Neurological: She is alert and oriented to person, place, and time.  Skin: Skin is warm and dry.  Psychiatric: She has a normal mood and affect. Her behavior is normal. Thought content normal.  Nursing note and vitals reviewed.   Assessment & Plan:  1: Chronic heart failure with preserved ejection fraction- - NYHA Class III -Euvolemic today -Weight is up 18.4 pounds since she was last here 3 months ago. Says that she's not eating "much" but also says that she isn't getting any exercise because she gets so tired so easily. Encouraged her to be as active as she can be so that her metabolism can speed up.   - Continue to not add salt to food - saw her cardiologist Fletcher Anon) on 11/08/16  2: HTN- - Blood pressure mildly elevated - Saw her PCP Julian Hy) on  11/05/16  3: Angina- - She continues to have intermittent angina for which she'll take SL NTG on occasion.  - has been started on isosorbide and says that she thinks she's had less angina since beginning this - Call cardiologist if her anginal pattern changes  4: Diabetes-  - Glucose yesterday morning was 99. - Last A1c on 09/02/16 was 6.9%  5: Chronic fatigue- - will check a TSH, T3 & T4 levels  today - will also order a sleep study to rule out sleep apnea  Patient did not bring her medications nor a list. Each medication was verbally reviewed with the patient and she was encouraged to bring the bottles to every visit to confirm accuracy of list.  Return here in 3 months or sooner for any questions/problems before then

## 2016-12-16 NOTE — Patient Instructions (Signed)
Continue weighing daily and call for an overnight weight gain of > 2 pounds or a weekly weight gain of >5 pounds. 

## 2016-12-17 ENCOUNTER — Encounter: Payer: Self-pay | Admitting: Family

## 2016-12-17 ENCOUNTER — Encounter: Payer: Self-pay | Admitting: Unknown Physician Specialty

## 2016-12-17 LAB — T3: T3, Total: 94 ng/dL (ref 71–180)

## 2016-12-17 LAB — T4: T4, Total: 5.9 ug/dL (ref 4.5–12.0)

## 2016-12-19 ENCOUNTER — Ambulatory Visit: Payer: Medicaid Other | Attending: Specialist

## 2016-12-19 DIAGNOSIS — G4733 Obstructive sleep apnea (adult) (pediatric): Secondary | ICD-10-CM | POA: Insufficient documentation

## 2016-12-19 DIAGNOSIS — G4761 Periodic limb movement disorder: Secondary | ICD-10-CM | POA: Insufficient documentation

## 2016-12-19 DIAGNOSIS — E669 Obesity, unspecified: Secondary | ICD-10-CM | POA: Diagnosis not present

## 2016-12-19 DIAGNOSIS — G4700 Insomnia, unspecified: Secondary | ICD-10-CM | POA: Diagnosis present

## 2016-12-19 DIAGNOSIS — I10 Essential (primary) hypertension: Secondary | ICD-10-CM | POA: Diagnosis not present

## 2016-12-20 ENCOUNTER — Encounter: Payer: Self-pay | Admitting: Unknown Physician Specialty

## 2016-12-20 ENCOUNTER — Encounter: Payer: Self-pay | Admitting: Family Medicine

## 2016-12-20 ENCOUNTER — Ambulatory Visit (INDEPENDENT_AMBULATORY_CARE_PROVIDER_SITE_OTHER): Payer: Medicaid Other | Admitting: Family Medicine

## 2016-12-20 VITALS — BP 124/81 | HR 102 | Temp 98.5°F | Wt 232.5 lb

## 2016-12-20 DIAGNOSIS — R6889 Other general symptoms and signs: Secondary | ICD-10-CM

## 2016-12-20 DIAGNOSIS — R509 Fever, unspecified: Secondary | ICD-10-CM | POA: Diagnosis not present

## 2016-12-20 LAB — VERITOR FLU A/B WAIVED
Influenza A: NEGATIVE
Influenza B: NEGATIVE

## 2016-12-20 MED ORDER — BENZONATATE 100 MG PO CAPS: 100.0000 mg | ORAL_CAPSULE | Freq: Two times a day (BID) | ORAL | 12 refills | Status: DC | PRN

## 2016-12-20 MED ORDER — OSELTAMIVIR PHOSPHATE 75 MG PO CAPS: 75.0000 mg | ORAL_CAPSULE | Freq: Two times a day (BID) | ORAL | 0 refills | Status: DC

## 2016-12-20 MED ORDER — HYDROCOD POLST-CPM POLST ER 10-8 MG/5ML PO SUER: 5.0000 mL | Freq: Every evening | ORAL | 0 refills | Status: DC | PRN

## 2016-12-20 NOTE — Progress Notes (Signed)
BP 124/81 (BP Location: Right Arm, Patient Position: Sitting, Cuff Size: Large)   Pulse (!) 102   Temp 98.5 F (36.9 C)   Wt 232 lb 8 oz (105.5 kg)   LMP 11/12/2016 (Approximate)   SpO2 99%   BMI 39.91 kg/m    Subjective:    Patient ID: [name] [name], female    DOB: 23-Jan-1981, 36 y.o.   MRN: [id number]  HPI: [name] [name] is a 36 y.o. female  Chief Complaint  Patient presents with  . URI   UPPER RESPIRATORY TRACT INFECTION Duration: Yesterday Worst symptom: body aches, cough Fever: yes Cough: yes Shortness of breath: yes Wheezing: yes Chest pain: yes Chest tightness: yes Chest congestion: no Nasal congestion: yes Runny nose: yes Post nasal drip: yes Sneezing: yes Sore throat: yes Swollen glands: no Sinus pressure: no Headache: yes Face pain: no Toothache: yes Ear pain: yes left Ear pressure: yes left Eyes red/itching:no Eye drainage/crusting: no  Vomiting: yes Rash: no Fatigue: yes Sick contacts: yes Strep contacts: no  Context: worse Recurrent sinusitis: no Relief with OTC cold/cough medications: no  Treatments attempted: ibuprofen   Relevant past medical, surgical, family and social history reviewed and updated as indicated. Interim medical history since our last visit reviewed. Allergies and medications reviewed and updated.  Review of Systems  Constitutional: Positive for fatigue and fever. Negative for activity change, chills, diaphoresis and unexpected weight change.  HENT: Positive for congestion, postnasal drip, rhinorrhea, sinus pain, sinus pressure and sneezing. Negative for dental problem, drooling, ear discharge, ear pain, facial swelling, hearing loss, mouth sores, nosebleeds, sore throat, tinnitus, trouble swallowing and voice change.   Respiratory: Positive for cough, shortness of breath and wheezing. Negative for apnea, choking and stridor.   Cardiovascular: Positive for chest pain. Negative for palpitations and leg swelling.    Psychiatric/Behavioral: Negative.     Per HPI unless specifically indicated above     Objective:    BP 124/81 (BP Location: Right Arm, Patient Position: Sitting, Cuff Size: Large)   Pulse (!) 102   Temp 98.5 F (36.9 C)   Wt 232 lb 8 oz (105.5 kg)   LMP 11/12/2016 (Approximate)   SpO2 99%   BMI 39.91 kg/m   Wt Readings from Last 3 Encounters:  12/20/16 232 lb 8 oz (105.5 kg)  12/16/16 228 lb (103.4 kg)  12/08/16 219 lb (99.3 kg)    Physical Exam  Constitutional: She is oriented to person, place, and time. She appears well-developed and well-nourished. No distress.  HENT:  Head: Normocephalic and atraumatic.  Right Ear: Hearing and external ear normal.  Left Ear: Hearing and external ear normal.  Nose: Nose normal.  Mouth/Throat: Oropharynx is clear and moist. No oropharyngeal exudate.  Eyes: Conjunctivae, EOM and lids are normal. Pupils are equal, round, and reactive to light. Right eye exhibits no discharge. Left eye exhibits no discharge. No scleral icterus.  Neck: Normal range of motion. Neck supple. No JVD present. No tracheal deviation present. No thyromegaly present.  Cardiovascular: Normal rate, regular rhythm, normal heart sounds and intact distal pulses.  Exam reveals no gallop and no friction rub.   No murmur heard. Pulmonary/Chest: Effort normal and breath sounds normal. No stridor. No respiratory distress. She has no wheezes. She has no rales. She exhibits no tenderness.  Musculoskeletal: Normal range of motion.  Lymphadenopathy:    She has no cervical adenopathy.  Neurological: She is alert and oriented to person, place, and time.  Skin: Skin is intact. No rash  noted. She is not diaphoretic.  Psychiatric: She has a normal mood and affect. Her speech is normal and behavior is normal. Judgment and thought content normal. Cognition and memory are normal.  Nursing note and vitals reviewed.   Results for orders placed or performed in visit on 12/16/16  TSH   Result Value Ref Range   TSH 3.243 0.350 - 4.500 uIU/mL  T3  Result Value Ref Range   T3, Total 94 71 - 180 ng/dL  T4  Result Value Ref Range   T4, Total 5.9 4.5 - 12.0 ug/dL      Assessment & Plan:   Problem List Items Addressed This Visit    None    Visit Diagnoses    Flu-like symptoms    -  Primary   Rapid flu negative, but will treat with tamiflu given comorbidities. Tussionex and tessalon perles for comfort.Call with any concerns or if not getting better.    Fever, unspecified fever cause       Flu negative.    Relevant Orders   Veritor Flu A/B Waived       Follow up plan: Return if symptoms worsen or fail to improve.

## 2016-12-23 ENCOUNTER — Encounter: Payer: Self-pay | Admitting: Unknown Physician Specialty

## 2016-12-27 ENCOUNTER — Other Ambulatory Visit: Payer: Self-pay | Admitting: Unknown Physician Specialty

## 2016-12-27 MED ORDER — VITAMIN D (ERGOCALCIFEROL) 1.25 MG (50000 UNIT) PO CAPS: 50000.0000 [IU] | ORAL_CAPSULE | ORAL | 0 refills | Status: DC

## 2017-01-08 ENCOUNTER — Encounter: Payer: Self-pay | Admitting: Unknown Physician Specialty

## 2017-01-08 NOTE — Telephone Encounter (Signed)
Please see pts mychart message and advise

## 2017-01-09 ENCOUNTER — Ambulatory Visit: Payer: Medicaid Other | Attending: Specialist

## 2017-01-09 ENCOUNTER — Encounter: Payer: Self-pay | Admitting: Unknown Physician Specialty

## 2017-01-09 DIAGNOSIS — G4761 Periodic limb movement disorder: Secondary | ICD-10-CM | POA: Insufficient documentation

## 2017-01-09 DIAGNOSIS — G4733 Obstructive sleep apnea (adult) (pediatric): Secondary | ICD-10-CM | POA: Insufficient documentation

## 2017-01-10 ENCOUNTER — Other Ambulatory Visit (INDEPENDENT_AMBULATORY_CARE_PROVIDER_SITE_OTHER): Payer: Medicaid Other | Admitting: *Deleted

## 2017-01-10 DIAGNOSIS — R945 Abnormal results of liver function studies: Secondary | ICD-10-CM

## 2017-01-10 DIAGNOSIS — E785 Hyperlipidemia, unspecified: Secondary | ICD-10-CM | POA: Diagnosis not present

## 2017-01-10 DIAGNOSIS — R7989 Other specified abnormal findings of blood chemistry: Secondary | ICD-10-CM

## 2017-01-11 LAB — HEPATIC FUNCTION PANEL
ALT: 39 IU/L — ABNORMAL HIGH (ref 0–32)
AST: 26 IU/L (ref 0–40)
Albumin: 3.2 g/dL — ABNORMAL LOW (ref 3.5–5.5)
Alkaline Phosphatase: 162 IU/L — ABNORMAL HIGH (ref 39–117)
Bilirubin Total: 0.2 mg/dL (ref 0.0–1.2)
Bilirubin, Direct: 0.1 mg/dL (ref 0.00–0.40)
Total Protein: 6.2 g/dL (ref 6.0–8.5)

## 2017-01-11 LAB — LIPID PANEL
Chol/HDL Ratio: 3.5 ratio units (ref 0.0–4.4)
Cholesterol, Total: 138 mg/dL (ref 100–199)
HDL: 39 mg/dL — ABNORMAL LOW (ref >39)
LDL Calculated: 82 mg/dL (ref 0–99)
Triglycerides: 84 mg/dL (ref 0–149)
VLDL Cholesterol Cal: 17 mg/dL (ref 5–40)

## 2017-01-17 ENCOUNTER — Ambulatory Visit: Payer: Medicaid Other | Admitting: Unknown Physician Specialty

## 2017-01-17 ENCOUNTER — Encounter: Payer: Self-pay | Admitting: Unknown Physician Specialty

## 2017-01-17 ENCOUNTER — Ambulatory Visit (INDEPENDENT_AMBULATORY_CARE_PROVIDER_SITE_OTHER): Payer: Medicaid Other | Admitting: Unknown Physician Specialty

## 2017-01-17 VITALS — BP 120/88 | HR 102 | Temp 98.4°F | Wt 222.8 lb

## 2017-01-17 DIAGNOSIS — J0100 Acute maxillary sinusitis, unspecified: Secondary | ICD-10-CM

## 2017-01-17 DIAGNOSIS — N289 Disorder of kidney and ureter, unspecified: Secondary | ICD-10-CM

## 2017-01-17 DIAGNOSIS — M25472 Effusion, left ankle: Secondary | ICD-10-CM

## 2017-01-17 DIAGNOSIS — G629 Polyneuropathy, unspecified: Secondary | ICD-10-CM | POA: Insufficient documentation

## 2017-01-17 DIAGNOSIS — M25471 Effusion, right ankle: Secondary | ICD-10-CM | POA: Diagnosis not present

## 2017-01-17 MED ORDER — AMOXICILLIN-POT CLAVULANATE 875-125 MG PO TABS: 1.0000 | ORAL_TABLET | Freq: Two times a day (BID) | ORAL | 0 refills | Status: DC

## 2017-01-17 NOTE — Progress Notes (Signed)
BP 120/88 (BP Location: Left Arm, Patient Position: Sitting, Cuff Size: Large)   Pulse (!) 102   Temp 98.4 F (36.9 C)   Wt 222 lb 12.8 oz (101.1 kg)   BMI 38.24 kg/m    Subjective:    Patient ID: [name] [name], female    DOB: 1981-01-14, 36 y.o.   MRN: [id number]  HPI: [name] [name] is a 36 y.o. female  Chief Complaint  Patient presents with  . Edema    pt states her lower legs and feet have been swelling for a while now, states it happens every night  . Tingling    pt states she has been getting tingling sensations in her feet and hands for about 2 weeks   . URI    pt states she has been having off and on fever and chills for about 2 weeks   Neuropathy Pt states she gets a tingling an stabbing pain particularly in hands but sometimes on skin. States this lasts for seconds and happens "all the time."  She is taking her Vitamin D.    Swelling States she gets swollen in the late afternoon and the evening.    URI   This is a new problem. Episode onset: 2 weeks comes and goes but severe over the weekend.  In bed with chillls. Associated symptoms include congestion, headaches and a sore throat. She has tried nothing for the symptoms.     Relevant past medical, surgical, family and social history reviewed and updated as indicated. Interim medical history since our last visit reviewed. Allergies and medications reviewed and updated.  Review of Systems  HENT: Positive for congestion and sore throat.   Neurological: Positive for headaches.    Per HPI unless specifically indicated above     Objective:    BP 120/88 (BP Location: Left Arm, Patient Position: Sitting, Cuff Size: Large)   Pulse (!) 102   Temp 98.4 F (36.9 C)   Wt 222 lb 12.8 oz (101.1 kg)   BMI 38.24 kg/m   Wt Readings from Last 3 Encounters:  01/17/17 222 lb 12.8 oz (101.1 kg)  12/20/16 232 lb 8 oz (105.5 kg)  12/16/16 228 lb (103.4 kg)    Physical Exam  Constitutional: She is oriented to person,  place, and time. She appears well-developed and well-nourished. No distress.  HENT:  Head: Normocephalic and atraumatic.  Right Ear: Tympanic membrane and ear canal normal.  Left Ear: Tympanic membrane and ear canal normal.  Nose: No rhinorrhea. Right sinus exhibits maxillary sinus tenderness. Right sinus exhibits no frontal sinus tenderness. Left sinus exhibits maxillary sinus tenderness. Left sinus exhibits no frontal sinus tenderness.  Eyes: Conjunctivae and lids are normal. Right eye exhibits no discharge. Left eye exhibits no discharge. No scleral icterus.  Cardiovascular: Normal rate and regular rhythm.   Pulmonary/Chest: Effort normal and breath sounds normal. No respiratory distress.  Abdominal: Normal appearance. There is no splenomegaly or hepatomegaly.  Musculoskeletal: Normal range of motion.       Right ankle: Normal.       Left ankle: Normal.  Neurological: She is alert and oriented to person, place, and time.  Skin: Skin is intact. No rash noted. No pallor.  Psychiatric: She has a normal mood and affect. Her behavior is normal. Judgment and thought content normal.      Assessment & Plan:   Problem List Items Addressed This Visit      Unprioritized   Edema of both ankles  None today.  Recent f/u with Dr. Fletcher Anon.  Discussed compression stockings      Neuropathy (HCC)    Check B12 level.  Recently diagnosed woth sleep apnea.  Needs to follow for her CPAP.  Consider Gabapentin with a normal B12       Other Visit Diagnoses    Acute non-recurrent maxillary sinusitis    -  Primary   Relevant Medications   amoxicillin-clavulanate (AUGMENTIN) 875-125 MG tablet   Other Relevant Orders   B12   Renal insufficiency       Relevant Orders   Comprehensive metabolic panel   CBC with Differential/Platelet       Follow up plan: Call with results

## 2017-01-17 NOTE — Assessment & Plan Note (Addendum)
Check B12 level.  Recently diagnosed woth sleep apnea.  Needs to follow for her CPAP.  Consider Gabapentin with a normal B12

## 2017-01-17 NOTE — Assessment & Plan Note (Signed)
None today.  Recent f/u with Dr. Fletcher Anon.  Discussed compression stockings

## 2017-01-18 LAB — COMPREHENSIVE METABOLIC PANEL
ALT: 20 IU/L (ref 0–32)
AST: 16 IU/L (ref 0–40)
Albumin/Globulin Ratio: 0.9 — ABNORMAL LOW (ref 1.2–2.2)
Albumin: 3.1 g/dL — ABNORMAL LOW (ref 3.5–5.5)
Alkaline Phosphatase: 164 IU/L — ABNORMAL HIGH (ref 39–117)
BUN/Creatinine Ratio: 17 (ref 9–23)
BUN: 22 mg/dL — ABNORMAL HIGH (ref 6–20)
Bilirubin Total: <0.2 mg/dL (ref 0.0–1.2)
CO2: 23 mmol/L (ref 18–29)
Calcium: 8.5 mg/dL — ABNORMAL LOW (ref 8.7–10.2)
Chloride: 102 mmol/L (ref 96–106)
Creatinine, Ser: 1.29 mg/dL — ABNORMAL HIGH (ref 0.57–1.00)
GFR calc Af Amer: 62 mL/min/{1.73_m2} (ref >59)
GFR calc non Af Amer: 53 mL/min/{1.73_m2} — ABNORMAL LOW (ref >59)
Globulin, Total: 3.4 g/dL (ref 1.5–4.5)
Glucose: 182 mg/dL — ABNORMAL HIGH (ref 65–99)
Potassium: 4.6 mmol/L (ref 3.5–5.2)
Sodium: 139 mmol/L (ref 134–144)
Total Protein: 6.5 g/dL (ref 6.0–8.5)

## 2017-01-18 LAB — CBC WITH DIFFERENTIAL/PLATELET
Basophils Absolute: 0 10*3/uL (ref 0.0–0.2)
Basos: 0 %
EOS (ABSOLUTE): 1.8 10*3/uL — ABNORMAL HIGH (ref 0.0–0.4)
Eos: 20 %
Hematocrit: 32.3 % — ABNORMAL LOW (ref 34.0–46.6)
Hemoglobin: 10.6 g/dL — ABNORMAL LOW (ref 11.1–15.9)
Immature Grans (Abs): 0 10*3/uL (ref 0.0–0.1)
Immature Granulocytes: 0 %
Lymphocytes Absolute: 2.5 10*3/uL (ref 0.7–3.1)
Lymphs: 28 %
MCH: 26.6 pg (ref 26.6–33.0)
MCHC: 32.8 g/dL (ref 31.5–35.7)
MCV: 81 fL (ref 79–97)
Monocytes Absolute: 0.3 10*3/uL (ref 0.1–0.9)
Monocytes: 4 %
Neutrophils Absolute: 4.4 10*3/uL (ref 1.4–7.0)
Neutrophils: 48 %
Platelets: 415 10*3/uL — ABNORMAL HIGH (ref 150–379)
RBC: 3.98 x10E6/uL (ref 3.77–5.28)
RDW: 14.3 % (ref 12.3–15.4)
WBC: 8.9 10*3/uL (ref 3.4–10.8)

## 2017-01-18 LAB — VITAMIN B12: Vitamin B-12: 1484 pg/mL — ABNORMAL HIGH (ref 232–1245)

## 2017-01-20 ENCOUNTER — Encounter: Payer: Self-pay | Admitting: Family

## 2017-01-22 ENCOUNTER — Encounter: Payer: Self-pay | Admitting: Unknown Physician Specialty

## 2017-01-23 ENCOUNTER — Other Ambulatory Visit: Payer: Self-pay | Admitting: Unknown Physician Specialty

## 2017-01-23 DIAGNOSIS — D649 Anemia, unspecified: Secondary | ICD-10-CM

## 2017-01-25 ENCOUNTER — Encounter: Payer: Self-pay | Admitting: Unknown Physician Specialty

## 2017-01-28 ENCOUNTER — Encounter: Payer: Self-pay | Admitting: Unknown Physician Specialty

## 2017-01-28 NOTE — Telephone Encounter (Signed)
Please see mychart encounter and advise.

## 2017-01-29 MED ORDER — METOCLOPRAMIDE HCL 5 MG PO TABS
5.0000 mg | ORAL_TABLET | Freq: Four times a day (QID) | ORAL | 12 refills | Status: DC
Start: 2017-01-29 — End: 2017-05-16

## 2017-02-05 ENCOUNTER — Ambulatory Visit: Payer: Medicaid Other | Admitting: Unknown Physician Specialty

## 2017-02-06 ENCOUNTER — Other Ambulatory Visit: Payer: Self-pay | Admitting: Unknown Physician Specialty

## 2017-02-07 ENCOUNTER — Encounter: Payer: Self-pay | Admitting: Unknown Physician Specialty

## 2017-02-07 ENCOUNTER — Other Ambulatory Visit: Payer: Self-pay

## 2017-02-07 NOTE — Telephone Encounter (Signed)
Opened in Error.

## 2017-02-10 ENCOUNTER — Encounter: Payer: Self-pay | Admitting: Unknown Physician Specialty

## 2017-02-10 MED ORDER — DULAGLUTIDE 0.75 MG/0.5ML ~~LOC~~ SOAJ: 0.7500 mg | SUBCUTANEOUS | 3 refills | Status: DC

## 2017-02-18 ENCOUNTER — Encounter: Payer: Self-pay | Admitting: Unknown Physician Specialty

## 2017-02-19 ENCOUNTER — Ambulatory Visit (INDEPENDENT_AMBULATORY_CARE_PROVIDER_SITE_OTHER): Payer: Medicaid Other | Admitting: Family Medicine

## 2017-02-19 ENCOUNTER — Encounter: Payer: Self-pay | Admitting: Family Medicine

## 2017-02-19 ENCOUNTER — Ambulatory Visit: Payer: Medicaid Other | Admitting: Family Medicine

## 2017-02-19 VITALS — BP 138/94 | HR 94 | Temp 98.7°F | Wt 223.0 lb

## 2017-02-19 DIAGNOSIS — J069 Acute upper respiratory infection, unspecified: Secondary | ICD-10-CM

## 2017-02-19 DIAGNOSIS — R6889 Other general symptoms and signs: Secondary | ICD-10-CM | POA: Diagnosis not present

## 2017-02-19 LAB — VERITOR FLU A/B WAIVED
Influenza A: NEGATIVE
Influenza B: NEGATIVE

## 2017-02-19 MED ORDER — AZITHROMYCIN 250 MG PO TABS: ORAL_TABLET | ORAL | 0 refills | Status: DC

## 2017-02-19 MED ORDER — ALBUTEROL SULFATE HFA 108 (90 BASE) MCG/ACT IN AERS: 2.0000 | INHALATION_SPRAY | Freq: Four times a day (QID) | RESPIRATORY_TRACT | 2 refills | Status: DC | PRN

## 2017-02-19 MED ORDER — GUAIFENESIN-CODEINE 100-10 MG/5ML PO SOLN: 5.0000 mL | Freq: Three times a day (TID) | ORAL | 0 refills | Status: DC | PRN

## 2017-02-19 NOTE — Progress Notes (Signed)
BP (!) 138/94   Pulse 94   Temp 98.7 F (37.1 C)   Wt 223 lb (101.2 kg)   SpO2 98%   BMI 38.28 kg/m    Subjective:    Patient ID: [name] [name], female    DOB: 02/10/1981, 36 y.o.   MRN: [id number]  HPI: [name] [name] is a 36 y.o. female  Chief Complaint  Patient presents with  . URI    x 1 week, runny nose, sore throat, sneezing, productive cough with green mucus, fever, body aches.    URI Patient presents to clinic complaining of runny nose, sore throat, sneezing, productive cough with green mucus, chest pain and shortness of breath from coughing, fever, myalgia, intermittent sharp right ear pain x 1 week. Denies nausea, vomiting, diarrhea, constipation, neck swelling. Flu UTD. Patient had the flu twice in January and October. Took Tylenol without relief. Can't take OTC cold medications due to hypertension. No sick contacts, recent travel. Not taking any medication for allergies. Just picked up her CPAP machine today.   Relevant past medical, surgical, family and social history reviewed and updated as indicated. Interim medical history since our last visit reviewed. Allergies and medications reviewed and updated.  Review of Systems  Constitutional: Positive for fever. Negative for chills and fatigue.  HENT: Positive for congestion, ear pain, rhinorrhea, sneezing and sore throat. Negative for ear discharge, hearing loss, sinus pain, sinus pressure and trouble swallowing.   Eyes: Negative.   Respiratory: Positive for cough, chest tightness and shortness of breath. Negative for wheezing and stridor.   Cardiovascular: Positive for chest pain. Negative for palpitations.  Gastrointestinal: Negative for abdominal pain, constipation, diarrhea, nausea and vomiting.  Musculoskeletal: Positive for myalgias. Negative for neck pain.  Skin: Negative for rash.  Neurological: Negative for dizziness, weakness and headaches.    Per HPI unless specifically indicated above     Objective:      BP (!) 138/94   Pulse 94   Temp 98.7 F (37.1 C)   Wt 223 lb (101.2 kg)   SpO2 98%   BMI 38.28 kg/m   Wt Readings from Last 3 Encounters:  02/19/17 223 lb (101.2 kg)  01/17/17 222 lb 12.8 oz (101.1 kg)  12/20/16 232 lb 8 oz (105.5 kg)    Physical Exam  Constitutional: She is oriented to person, place, and time. She appears well-developed and well-nourished. No distress.  HENT:  Head: Normocephalic and atraumatic.  Right Ear: Hearing, tympanic membrane, external ear and ear canal normal.  Left Ear: Hearing, tympanic membrane, external ear and ear canal normal.  Nose: Nose normal.  Mouth/Throat: Posterior oropharyngeal erythema present. No oropharyngeal exudate.  Eyes: Conjunctivae are normal. Right eye exhibits no discharge. Left eye exhibits no discharge.  Neck: Normal range of motion. Neck supple.  Cardiovascular: Normal rate, regular rhythm, normal heart sounds and intact distal pulses.   Pulmonary/Chest: Effort normal and breath sounds normal. No stridor. No respiratory distress. She has no wheezes. She has no rales.  Abdominal: Soft. She exhibits no distension. There is no tenderness.  Musculoskeletal: Normal range of motion.  Neurological: She is alert and oriented to person, place, and time.  Skin: Skin is warm and dry. No rash noted.  Psychiatric: She has a normal mood and affect. Her behavior is normal. Judgment and thought content normal.  Nursing note and vitals reviewed.     Assessment & Plan:   Problem List Items Addressed This Visit    None    Visit Diagnoses  Flu-like symptoms    -  Primary   Relevant Orders   Influenza A & B (STAT) (Completed)   Upper respiratory tract infection, unspecified type       Flu swab in clinic is negative. Prescribed Zithromax and albuterol. Given strict return precautions if shortness of breath worsens or chest pain develops   Relevant Medications   azithromycin (ZITHROMAX) 250 MG tablet       Follow up plan: Return  if symptoms worsen or fail to improve.

## 2017-02-20 NOTE — Patient Instructions (Signed)
Follow up as needed

## 2017-02-28 ENCOUNTER — Other Ambulatory Visit: Payer: Self-pay | Admitting: Unknown Physician Specialty

## 2017-02-28 NOTE — Telephone Encounter (Signed)
Can you check to see if this is managed by another provider as my chart says [name] made dosing changes

## 2017-03-02 ENCOUNTER — Encounter: Payer: Self-pay | Admitting: Cardiovascular Disease

## 2017-03-02 ENCOUNTER — Encounter: Payer: Self-pay | Admitting: Unknown Physician Specialty

## 2017-03-03 ENCOUNTER — Encounter: Payer: Self-pay | Admitting: Cardiovascular Disease

## 2017-03-03 NOTE — Telephone Encounter (Signed)
Called and left patient a VM asking for her to please return my call.  

## 2017-03-03 NOTE — Telephone Encounter (Signed)
Called and spoke to patient. She stated that Malachy Mood writes this prescription but Helene Kelp name was on it as a dosage change because Otila Kluver just put it in the chart for how the patient is taking the medication. The patient states that she use to be taking 3 tablets per day where she is taking just one now. She states that this is all Otila Kluver changed, just so it would be correct in the chart.

## 2017-03-03 NOTE — Telephone Encounter (Signed)
[name] [name], patient is only taking one tablet of this medication per day, not 3 times daily.

## 2017-03-03 NOTE — Telephone Encounter (Signed)
Pt returned call

## 2017-03-10 ENCOUNTER — Encounter: Payer: Self-pay | Admitting: Unknown Physician Specialty

## 2017-03-10 DIAGNOSIS — K529 Noninfective gastroenteritis and colitis, unspecified: Secondary | ICD-10-CM

## 2017-03-10 NOTE — Telephone Encounter (Signed)
Refer to GI for severe diarrhea

## 2017-03-11 ENCOUNTER — Emergency Department
Admission: EM | Admit: 2017-03-11 | Discharge: 2017-03-11 | Disposition: A | Discharge status: Home / Self Care | Payer: Medicaid Other | Attending: Emergency Medicine | Admitting: Emergency Medicine

## 2017-03-11 ENCOUNTER — Encounter: Payer: Self-pay | Admitting: Emergency Medicine

## 2017-03-11 ENCOUNTER — Encounter: Payer: Self-pay | Admitting: Unknown Physician Specialty

## 2017-03-11 ENCOUNTER — Emergency Department: Payer: Medicaid Other

## 2017-03-11 DIAGNOSIS — I110 Hypertensive heart disease with heart failure: Secondary | ICD-10-CM | POA: Diagnosis not present

## 2017-03-11 DIAGNOSIS — I2510 Atherosclerotic heart disease of native coronary artery without angina pectoris: Secondary | ICD-10-CM | POA: Diagnosis not present

## 2017-03-11 DIAGNOSIS — R1012 Left upper quadrant pain: Secondary | ICD-10-CM | POA: Insufficient documentation

## 2017-03-11 DIAGNOSIS — Z7984 Long term (current) use of oral hypoglycemic drugs: Secondary | ICD-10-CM | POA: Insufficient documentation

## 2017-03-11 DIAGNOSIS — Z7982 Long term (current) use of aspirin: Secondary | ICD-10-CM | POA: Insufficient documentation

## 2017-03-11 DIAGNOSIS — E119 Type 2 diabetes mellitus without complications: Secondary | ICD-10-CM | POA: Insufficient documentation

## 2017-03-11 DIAGNOSIS — I5022 Chronic systolic (congestive) heart failure: Secondary | ICD-10-CM | POA: Insufficient documentation

## 2017-03-11 DIAGNOSIS — G8929 Other chronic pain: Secondary | ICD-10-CM | POA: Diagnosis not present

## 2017-03-11 DIAGNOSIS — Z87891 Personal history of nicotine dependence: Secondary | ICD-10-CM | POA: Insufficient documentation

## 2017-03-11 LAB — COMPREHENSIVE METABOLIC PANEL
ALT: 22 U/L (ref 14–54)
AST: 21 U/L (ref 15–41)
Albumin: 2.9 g/dL — ABNORMAL LOW (ref 3.5–5.0)
Alkaline Phosphatase: 106 U/L (ref 38–126)
Anion gap: 6 (ref 5–15)
BUN: 28 mg/dL — ABNORMAL HIGH (ref 6–20)
CO2: 22 mmol/L (ref 22–32)
Calcium: 8.8 mg/dL — ABNORMAL LOW (ref 8.9–10.3)
Chloride: 108 mmol/L (ref 101–111)
Creatinine, Ser: 1.16 mg/dL — ABNORMAL HIGH (ref 0.44–1.00)
GFR calc Af Amer: >60 mL/min (ref >60)
GFR calc non Af Amer: 59 mL/min — ABNORMAL LOW (ref >60)
Glucose, Bld: 178 mg/dL — ABNORMAL HIGH (ref 65–99)
Potassium: 4.8 mmol/L (ref 3.5–5.1)
Sodium: 136 mmol/L (ref 135–145)
Total Bilirubin: 0.3 mg/dL (ref 0.3–1.2)
Total Protein: 7.1 g/dL (ref 6.5–8.1)

## 2017-03-11 LAB — URINALYSIS, COMPLETE (UACMP) WITH MICROSCOPIC
Bilirubin Urine: NEGATIVE
Glucose, UA: 50 mg/dL — AB
Ketones, ur: NEGATIVE mg/dL
Nitrite: NEGATIVE
Protein, ur: 100 mg/dL — AB
Specific Gravity, Urine: 1.012 (ref 1.005–1.030)
pH: 5 (ref 5.0–8.0)

## 2017-03-11 LAB — CBC
HCT: 33.8 % — ABNORMAL LOW (ref 35.0–47.0)
Hemoglobin: 11.1 g/dL — ABNORMAL LOW (ref 12.0–16.0)
MCH: 26.4 pg (ref 26.0–34.0)
MCHC: 32.8 g/dL (ref 32.0–36.0)
MCV: 80.3 fL (ref 80.0–100.0)
Platelets: 363 10*3/uL (ref 150–440)
RBC: 4.21 MIL/uL (ref 3.80–5.20)
RDW: 14.1 % (ref 11.5–14.5)
WBC: 7.2 10*3/uL (ref 3.6–11.0)

## 2017-03-11 LAB — LIPASE, BLOOD: Lipase: 15 U/L (ref 11–51)

## 2017-03-11 NOTE — ED Provider Notes (Signed)
[facility] Emergency Department Provider Note   ____________________________________________   First MD Initiated Contact with Patient 03/11/17 1548     (approximate)  I have reviewed the triage vital signs and the nursing notes.   HISTORY  Chief Complaint Abdominal Pain and Diarrhea   HPI [name] [name] is a 36 y.o. female reports that for about the last year she's been having episodes where her stomach will feel very full, sometimes she'll get very nauseated and vomit, or crampy pain specialist in the left upper stomach. Thereafter her stomach will "unload" and she'll be frequently left with loose stool and diarrhea for the next day. She reports that her doctor thinks she may have"gastroparesis".  Is here reports that seems her symptoms are slightly worse last week, but these symptoms have come and gone for essentially a year now. No fevers or chills. No chest pain or trouble breathing. Denies pregnancy. Has not had a menstrual cycle in over 3 months, currently on implanon.   Past Medical History:  Diagnosis Date  . Chronic systolic CHF (congestive heart failure) ([place])    a. echo 03/2015: EF 30-35%, mild concentric LVH, severe HK of inf, inflat, & lat walls, mod MR, mild TR;  b. 04/2015 LV Gram: EF 55-65%.  . Diabetes type 2, controlled ([place])    a. since 2002   . Heavy menses    a. H/O IUD - expired in 2014 - remains in place.  . Hyperlipidemia   . Hypertension   . Iron deficiency anemia   . Ischemic cardiomyopathy    a. 03/2015 EF 30-35% post NSTEMI;  b. 04/2015 EF 55-65% on LV gram.  . Kidney disease   . MI (myocardial infarction) 03/29/2015  . Obesity   . Renal insufficiency   . Tobacco abuse    a. quit 03/2015.  Marland Kitchen Uterine fibroid   . Vertigo   . Vitamin D deficiency     Patient Active Problem List   Diagnosis Date Noted  . Neuropathy ([place]) 01/17/2017  . Edema of both ankles 01/17/2017  . Fatigue 12/16/2016  . Myalgic 10/25/2016  .  Vitamin D deficiency 10/25/2016  . Insomnia 10/25/2016  . Urinary tract infection 09/20/2016  . HTN (hypertension) 09/05/2016  . Vertigo 06/21/2016  . Elevated liver function tests 06/05/2016  . Chronic diastolic heart failure ([place]) 03/19/2016  . Tachycardia 03/19/2016  . IBS (irritable bowel syndrome) 12/15/2015  . Proteinuria 08/30/2015  . Coronary artery disease involving native coronary artery of native heart with angina pectoris with documented spasm ([place])   . Angina pectoris ([place])   . Iron deficiency anemia   . Heavy menses   . SOB (shortness of breath) 08/21/2015  . Hyperlipidemia 04/13/2015  . Ischemic cardiomyopathy   . Diabetes type 2, controlled ([place])   . Obesity     Past Surgical History:  Procedure Laterality Date  . APPENDECTOMY    . CARDIAC CATHETERIZATION  4/16   x2 stent [facility]  . CARDIAC CATHETERIZATION N/A 05/05/2015   Procedure: Left Heart Cath and Coronary Angiography;  Surgeon: Peter M Martinique, MD;  Location: [place] [facility];  Service: Cardiovascular;  Laterality: N/A;  . COLONOSCOPY WITH PROPOFOL N/A 07/31/2016   Procedure: COLONOSCOPY WITH PROPOFOL;  Surgeon: Lollie Sails, MD;  Location: [facility] ENDOSCOPY;  Service: Endoscopy;  Laterality: N/A;  . ESOPHAGOGASTRODUODENOSCOPY (EGD) WITH PROPOFOL N/A 07/31/2016   Procedure: ESOPHAGOGASTRODUODENOSCOPY (EGD) WITH PROPOFOL;  Surgeon: Lollie Sails, MD;  Location: [facility] ENDOSCOPY;  Service: Endoscopy;  Laterality: N/A;  . LAPAROSCOPIC APPENDECTOMY N/A 08/07/2016   Procedure: APPENDECTOMY LAPAROSCOPIC;  Surgeon: Hubbard Robinson, MD;  Location: [facility] ORS;  Service: General;  Laterality: N/A;  . MOUTH SURGERY      Prior to Admission medications   Medication Sig Start Date End Date Taking? Authorizing Provider  albuterol (PROVENTIL HFA;VENTOLIN HFA) 108 (90 Base) MCG/ACT inhaler Inhale 2 puffs into the lungs every 6 (six) hours as needed for wheezing or shortness of breath. 02/19/17   Volney American, PA-C    amitriptyline (ELAVIL) 10 MG tablet Take 1 tablet (10 mg total) by mouth at bedtime. 10/25/16   Kathrine Haddock, NP  aspirin EC 81 MG tablet Take 81 mg by mouth daily.    Historical Provider, MD  azithromycin (ZITHROMAX) 250 MG tablet Take 2 tablets day one, then 1 tablet daily 02/19/17   Volney American, PA-C  benzonatate (TESSALON) 100 MG capsule Take 1 capsule (100 mg total) by mouth 2 (two) times daily as needed for cough. 12/20/16   Megan P Johnson, DO  Dulaglutide (TRULICITY) 3.78 HY/8.5OY SOPN Inject 0.75 mg into the skin once a week. 02/10/17   Kathrine Haddock, NP  etonogestrel (NEXPLANON) 68 MG IMPL implant 1 each by Subdermal route once.    Historical Provider, MD  ferrous sulfate 325 (65 FE) MG tablet Take 325 mg by mouth daily with breakfast.    Historical Provider, MD  ferrous sulfate 325 (65 FE) MG tablet TAKE ONE TABLET BY MOUTH THREE TIMES DAILY WITH MEALS 03/03/17   Kathrine Haddock, NP  glimepiride (AMARYL) 4 MG tablet Take 1 tablet (4 mg total) by mouth daily. 09/09/16   Kathrine Haddock, NP  glucose blood (ACCU-CHEK ACTIVE STRIPS) test strip Use as instructed 03/25/16   Kathrine Haddock, NP  guaiFENesin-codeine 100-10 MG/5ML syrup Take 5 mLs by mouth 3 (three) times daily as needed for cough. 02/19/17   Volney American, PA-C  isosorbide mononitrate (IMDUR) 30 MG 24 hr tablet Take 1 tablet (30 mg total) by mouth daily. 11/08/16 02/06/17  Wellington Hampshire, MD  JANUVIA 100 MG tablet TAKE ONE TABLET BY MOUTH ONCE DAILY 02/07/17   Megan P Johnson, DO  loperamide (IMODIUM) 2 MG capsule Take 2 mg by mouth as needed for diarrhea or loose stools.    Historical Provider, MD  meclizine (ANTIVERT) 32 MG tablet Take 1 tablet (32 mg total) by mouth 3 (three) times daily as needed. Patient taking differently: Take 32 mg by mouth 3 (three) times daily as needed for dizziness or nausea.  06/21/16   Kathrine Haddock, NP  metoCLOPramide (REGLAN) 5 MG tablet Take 1 tablet (5 mg total) by mouth 4 (four) times daily.  01/29/17   Kathrine Haddock, NP  metoprolol tartrate (LOPRESSOR) 25 MG tablet Take 1 tablet (25 mg total) by mouth 2 (two) times daily. 11/25/16   Wellington Hampshire, MD  nitroGLYCERIN (NITROSTAT) 0.4 MG SL tablet Place 1 tablet (0.4 mg total) under the tongue every 5 (five) minutes as needed for chest pain. Reported on 02/20/2016 03/11/16   Wellington Hampshire, MD  ondansetron (ZOFRAN ODT) 4 MG disintegrating tablet Take 1 tablet (4 mg total) by mouth every 8 (eight) hours as needed for nausea or vomiting. 12/08/16   Paulette Blanch, MD  pantoprazole (PROTONIX) 40 MG tablet Take 1 tablet (40 mg total) by mouth daily at 6 (six) AM. 09/30/16   Kathrine Haddock, NP  rosuvastatin (CRESTOR) 5 MG tablet Take 1 tablet (5 mg total) by  mouth every other day. 12/11/16 03/11/17  Wende Bushy, MD  venlafaxine XR (EFFEXOR-XR) 75 MG 24 hr capsule Take 75 mg by mouth 2 (two) times daily.     Historical Provider, MD  Vitamin D, Ergocalciferol, (DRISDOL) 50000 units CAPS capsule Take 1 capsule (50,000 Units total) by mouth every 7 (seven) days. 12/27/16   Kathrine Haddock, NP    Allergies Lisinopril; Rosemary oil; Shellfish allergy; and Metformin and related  Family History  Problem Relation Age of Onset  . Diabetes Father   . Cancer Maternal Grandmother     lung  . Cancer Maternal Grandfather     prostate  . Diabetes Paternal Grandfather   . Cancer - Cervical Maternal Aunt     Social History Social History  Substance Use Topics  . Smoking status: Former Smoker    Years: 15.00    Quit date: 03/10/2015  . Smokeless tobacco: Never Used  . Alcohol use No    Review of Systems Constitutional: No fever/chills Eyes: No visual changes. ENT: No sore throat. Cardiovascular: Denies chest pain. Respiratory: Denies shortness of breath. Gastrointestinal:  No constipation. Genitourinary: Negative for dysuria.No pain or burning with urination. Musculoskeletal: Negative for back pain. Skin: Negative for rash. No leg  swelling. Neurological: Negative for headaches, focal weakness or numbness.  10-point ROS otherwise negative.  ____________________________________________   PHYSICAL EXAM:  VITAL SIGNS: ED Triage Vitals  Enc Vitals Group     BP 03/11/17 1121 (!) 161/107     Pulse Rate 03/11/17 1121 (!) 106     Resp 03/11/17 1121 18     Temp 03/11/17 1121 98.2 F (36.8 C)     Temp Source 03/11/17 1121 Oral     SpO2 03/11/17 1121 99 %     Weight 03/11/17 1122 223 lb (101.2 kg)     Height 03/11/17 1122 5\' 4"  (1.626 m)     Head Circumference --      Peak Flow --      Pain Score 03/11/17 1121 8     Pain Loc --      Pain Edu? --      Excl. in [place]? --     Constitutional: Alert and oriented. Well appearing and in no acute distress.She and her friend are both very pleasant. Eyes: Conjunctivae are normal. PERRL. EOMI. Head: Atraumatic. Nose: No congestion/rhinnorhea. Mouth/Throat: Mucous membranes are moist.  Oropharynx non-erythematous. Neck: No stridor.   Cardiovascular: Normal rate, regular rhythm. Grossly normal heart sounds.  Good peripheral circulation. Respiratory: Normal respiratory effort.  No retractions. Lungs CTAB. Clear lungs, speak in full sentences. Gastrointestinal: Soft and nontender. Protuberant, and obese but no fluid wave or evidence of ascites. No distention. No abdominal bruits. No CVA tenderness. No rebound or guarding. Musculoskeletal: No lower extremity tenderness nor edema.  N Neurologic:  Normal speech and language. No gross focal neurologic deficits are appreciated. No gait instability. Skin:  Skin is warm, dry and intact. No rash noted. Psychiatric: Mood and affect are normal. Speech and behavior are normal.  ____________________________________________   LABS (all labs ordered are listed, but only abnormal results are displayed)  Labs Reviewed  COMPREHENSIVE METABOLIC PANEL - Abnormal; Notable for the following:       Result Value   Glucose, Bld 178 (*)    BUN  28 (*)    Creatinine, Ser 1.16 (*)    Calcium 8.8 (*)    Albumin 2.9 (*)    GFR calc non Af Amer 59 (*)  All other components within normal limits  CBC - Abnormal; Notable for the following:    Hemoglobin 11.1 (*)    HCT 33.8 (*)    All other components within normal limits  URINALYSIS, COMPLETE (UACMP) WITH MICROSCOPIC - Abnormal; Notable for the following:    Color, Urine YELLOW (*)    APPearance HAZY (*)    Glucose, UA 50 (*)    Hgb urine dipstick MODERATE (*)    Protein, ur 100 (*)    Leukocytes, UA TRACE (*)    Bacteria, UA FEW (*)    Squamous Epithelial / LPF 0-5 (*)    All other components within normal limits  URINE CULTURE  LIPASE, BLOOD  POC URINE PREG, ED   ____________________________________________  EKG   ____________________________________________  RADIOLOGY  Dg Abdomen Acute W/chest  Result Date: 03/11/2017 CLINICAL DATA:  Left upper quadrant pain, dizziness, vomiting, and diarrhea. Possible gastro paresis. EXAM: DG ABDOMEN ACUTE W/ 1V CHEST COMPARISON:  Chest radiographs 10/24/2016. CT abdomen and pelvis 08/05/2016. FINDINGS: The cardiomediastinal silhouette is within normal limits. The lungs are well inflated and clear. There is no evidence of pleural effusion or pneumothorax. No acute osseous abnormality is identified. There is no evidence of intraperitoneal free air. Gas and a small amount of stool are present throughout the colon and in the rectum. No dilated loops of bowel are seen to suggest obstruction. Small calcifications in the pelvis likely represent phleboliths. IMPRESSION: Negative abdominal radiographs.  No acute cardiopulmonary disease. Electronically Signed   By: Logan Bores M.D.   On: 03/11/2017 16:35    ____________________________________________   PROCEDURES  Procedure(s) performed: None  Procedures  Critical Care performed: No  ____________________________________________   INITIAL IMPRESSION / ASSESSMENT AND PLAN / ED  COURSE  Pertinent labs & imaging results that were available during my care of the patient were reviewed by me and considered in my medical decision making (see chart for details).  Patient presents for about one year of chronic abdominal discomfort, notably worse after eating then followed by loose stools. She says it seems to be a little bit worse the last week, she talked her primary and has been given a referral to gastroenterology already. They said her symptomatology and her reassuring exam, previous medical history suspect this may be due to some type of gastroparesis for motility type disorder. There is no evidence of active infection, peritonitis or acute abdomen. She does take Zofran at home already, the present time she appears well but reports symptoms are intermittent. No obvious infectious symptoms  Denies any cardiac or pulmonary symptoms  ----------------------------------------- 5:16 PM on 03/11/2017 -----------------------------------------  Patient resting comfortably, currently eating [employer] meat sticks without difficulty. She reports she feels okay now, is comfortable with the plan to go home and continue with follow-up with gastroenterology and her primary as already in process.  Return precautions and treatment recommendations and follow-up discussed with the patient who is agreeable with the plan.       ____________________________________________   FINAL CLINICAL IMPRESSION(S) / ED DIAGNOSES  Final diagnoses:  Abdominal pain, chronic, left upper quadrant      NEW MEDICATIONS STARTED DURING THIS VISIT:  New Prescriptions   No medications on file     Note:  This document was prepared using Dragon voice recognition software and may include unintentional dictation errors.     Delman Kitten, MD 03/11/17 [phone]

## 2017-03-11 NOTE — Discharge Instructions (Signed)
? ?  Please return to the emergency room right away if you are to develop a fever, severe nausea, your pain becomes severe or worsens, you are unable to keep food down, begin vomiting any dark or bloody fluid, you develop any dark or bloody stools, feel dehydrated, or other new concerns or symptoms arise. ? ?

## 2017-03-11 NOTE — ED Triage Notes (Signed)
Pt to ed with c/o abd pain, dizziness, diarrhea, vomiting constantly x 1 year.  Pt states worse today.

## 2017-03-11 NOTE — ED Notes (Signed)
Bedside poct preg negative

## 2017-03-13 LAB — URINE CULTURE: Special Requests: NORMAL

## 2017-03-17 ENCOUNTER — Ambulatory Visit: Payer: Medicaid Other | Attending: Family | Admitting: Family

## 2017-03-17 ENCOUNTER — Encounter: Payer: Self-pay | Admitting: Family

## 2017-03-17 VITALS — BP 151/91 | HR 94 | Resp 18 | Ht 64.0 in | Wt 229.2 lb

## 2017-03-17 DIAGNOSIS — E119 Type 2 diabetes mellitus without complications: Secondary | ICD-10-CM | POA: Insufficient documentation

## 2017-03-17 DIAGNOSIS — N181 Chronic kidney disease, stage 1: Secondary | ICD-10-CM

## 2017-03-17 DIAGNOSIS — Z7984 Long term (current) use of oral hypoglycemic drugs: Secondary | ICD-10-CM | POA: Insufficient documentation

## 2017-03-17 DIAGNOSIS — E559 Vitamin D deficiency, unspecified: Secondary | ICD-10-CM | POA: Insufficient documentation

## 2017-03-17 DIAGNOSIS — Z79899 Other long term (current) drug therapy: Secondary | ICD-10-CM | POA: Insufficient documentation

## 2017-03-17 DIAGNOSIS — Z833 Family history of diabetes mellitus: Secondary | ICD-10-CM | POA: Insufficient documentation

## 2017-03-17 DIAGNOSIS — I10 Essential (primary) hypertension: Secondary | ICD-10-CM

## 2017-03-17 DIAGNOSIS — D649 Anemia, unspecified: Secondary | ICD-10-CM | POA: Diagnosis not present

## 2017-03-17 DIAGNOSIS — I252 Old myocardial infarction: Secondary | ICD-10-CM | POA: Insufficient documentation

## 2017-03-17 DIAGNOSIS — I5032 Chronic diastolic (congestive) heart failure: Secondary | ICD-10-CM | POA: Insufficient documentation

## 2017-03-17 DIAGNOSIS — Z6839 Body mass index (BMI) 39.0-39.9, adult: Secondary | ICD-10-CM | POA: Diagnosis not present

## 2017-03-17 DIAGNOSIS — I110 Hypertensive heart disease with heart failure: Secondary | ICD-10-CM | POA: Diagnosis not present

## 2017-03-17 DIAGNOSIS — Z7982 Long term (current) use of aspirin: Secondary | ICD-10-CM | POA: Diagnosis not present

## 2017-03-17 DIAGNOSIS — N289 Disorder of kidney and ureter, unspecified: Secondary | ICD-10-CM | POA: Insufficient documentation

## 2017-03-17 DIAGNOSIS — E669 Obesity, unspecified: Secondary | ICD-10-CM | POA: Insufficient documentation

## 2017-03-17 DIAGNOSIS — Z87891 Personal history of nicotine dependence: Secondary | ICD-10-CM | POA: Insufficient documentation

## 2017-03-17 DIAGNOSIS — E785 Hyperlipidemia, unspecified: Secondary | ICD-10-CM | POA: Insufficient documentation

## 2017-03-17 DIAGNOSIS — R5382 Chronic fatigue, unspecified: Secondary | ICD-10-CM | POA: Diagnosis not present

## 2017-03-17 DIAGNOSIS — I255 Ischemic cardiomyopathy: Secondary | ICD-10-CM | POA: Diagnosis not present

## 2017-03-17 DIAGNOSIS — I209 Angina pectoris, unspecified: Secondary | ICD-10-CM

## 2017-03-17 DIAGNOSIS — E1122 Type 2 diabetes mellitus with diabetic chronic kidney disease: Secondary | ICD-10-CM

## 2017-03-17 MED ORDER — NITROGLYCERIN 0.4 MG SL SUBL: 0.4000 mg | SUBLINGUAL_TABLET | SUBLINGUAL | 3 refills | Status: DC | PRN

## 2017-03-17 NOTE — Progress Notes (Signed)
Subjective:    Patient ID: [name] [name], female    DOB: January 14, 1981, 36 y.o.   MRN: [id number]  HPI  [name] [name] is a 36 y/o female with a history of diabetes, hyperlipidemia, HTN, anemia, renal insufficiency, MI and heart failure with preserved ejection fraction.   Was in the ED 03/11/17 with abdominal pain and diarrhea. Evaluated and discharged home. Was in the ED on 12/08/16 for dizziness and dehydration. Was given IV hydration along with amoxicillen for a dental infection. Previous ED visit was 10/24/16 for chest pain. Troponins were negative and she was discharged home with cardiology follow-up. Was admitted 08/05/16 with acute appendicitis. She had a lap appendectomy done on 08/07/16 and was discharged on 08/08/16. She presented to [facility] in April of 2016 with NSTEMI. Echocardiogram showed an ejection fraction of 30% with severe inferior, inferolateral and lateral wall hypokinesis with moderate mitral regurgitation. Cardiac catheterization showed occluded mid left circumflex with significant disease affecting the proximal OM1. There was mild disease affecting the LAD and minor irregularities in the RCA. Ejection fraction was 35%. Successful angioplasty and drug-eluting stent placement to the mid LCX and proximal OM was performed. She underwent a repeat cardiac catheterization in May 2016 which showed patent stents in the left circumflex and OM1. Ejection fraction was 50-55%.   Has had hypotension for which losartan had to be stopped.   She returns today for a follow-up visit. Still not on a diuretic and reports a stable weight. Continues to be fatigued easily but feels like she's sleeping better since having her CPAP. Reports having a fever almost every other week and her PCP is unable to find the cause. Continues to have chronic angina which may be getting worse. Isosorbide doesn't seems to be helping like it used to.   Past Medical History:  Diagnosis Date  . Chronic systolic CHF (congestive heart  failure) ([place])    a. echo 03/2015: EF 30-35%, mild concentric LVH, severe HK of inf, inflat, & lat walls, mod MR, mild TR;  b. 04/2015 LV Gram: EF 55-65%.  . Diabetes type 2, controlled ([place])    a. since 2002   . Heavy menses    a. H/O IUD - expired in 2014 - remains in place.  . Hyperlipidemia   . Hypertension   . Iron deficiency anemia   . Ischemic cardiomyopathy    a. 03/2015 EF 30-35% post NSTEMI;  b. 04/2015 EF 55-65% on LV gram.  . Kidney disease   . MI (myocardial infarction) 03/29/2015  . Obesity   . Renal insufficiency   . Tobacco abuse    a. quit 03/2015.  Marland Kitchen Uterine fibroid   . Vertigo   . Vitamin D deficiency    Past Surgical History:  Procedure Laterality Date  . APPENDECTOMY    . CARDIAC CATHETERIZATION  4/16   x2 stent [facility]  . CARDIAC CATHETERIZATION N/A 05/05/2015   Procedure: Left Heart Cath and Coronary Angiography;  Surgeon: Peter M Martinique, MD;  Location: [facility];  Service: Cardiovascular;  Laterality: N/A;  . COLONOSCOPY WITH PROPOFOL N/A 07/31/2016   Procedure: COLONOSCOPY WITH PROPOFOL;  Surgeon: Lollie Sails, MD;  Location: [facility] ENDOSCOPY;  Service: Endoscopy;  Laterality: N/A;  . ESOPHAGOGASTRODUODENOSCOPY (EGD) WITH PROPOFOL N/A 07/31/2016   Procedure: ESOPHAGOGASTRODUODENOSCOPY (EGD) WITH PROPOFOL;  Surgeon: Lollie Sails, MD;  Location: [facility] ENDOSCOPY;  Service: Endoscopy;  Laterality: N/A;  . LAPAROSCOPIC APPENDECTOMY N/A 08/07/2016   Procedure: APPENDECTOMY LAPAROSCOPIC;  Surgeon: Hubbard Robinson, MD;  Location: [facility] ORS;  Service: General;  Laterality: N/A;  . MOUTH SURGERY     Family History  Problem Relation Age of Onset  . Diabetes Father   . Cancer Maternal Grandmother     lung  . Cancer Maternal Grandfather     prostate  . Diabetes Paternal Grandfather   . Cancer - Cervical Maternal Aunt    Social History  Substance Use Topics  . Smoking status: Former Smoker    Years: 15.00    Quit date: 03/10/2015  . Smokeless tobacco:  Never Used  . Alcohol use No   Allergies  Allergen Reactions  . Lisinopril Anaphylaxis, Itching and Swelling  . Rosemary Oil Anaphylaxis  . Shellfish Allergy Itching and Swelling  . Metformin And Related Diarrhea, Nausea And Vomiting and Rash   Prior to Admission medications   Medication Sig Start Date End Date Taking? Authorizing Provider  albuterol (PROVENTIL HFA;VENTOLIN HFA) 108 (90 Base) MCG/ACT inhaler Inhale 2 puffs into the lungs every 6 (six) hours as needed for wheezing or shortness of breath. 02/19/17  Yes Volney American, PA-C  amitriptyline (ELAVIL) 10 MG tablet Take 1 tablet (10 mg total) by mouth at bedtime. 10/25/16  Yes Kathrine Haddock, NP  aspirin EC 81 MG tablet Take 81 mg by mouth daily.   Yes Historical Provider, MD  benzonatate (TESSALON) 100 MG capsule Take 1 capsule (100 mg total) by mouth 2 (two) times daily as needed for cough. 12/20/16  Yes Megan P Johnson, DO  Dulaglutide (TRULICITY) 0.96 EA/5.4UJ SOPN Inject 0.75 mg into the skin once a week. 02/10/17  Yes Kathrine Haddock, NP  etonogestrel (NEXPLANON) 68 MG IMPL implant 1 each by Subdermal route once.   Yes Historical Provider, MD  ferrous sulfate 325 (65 FE) MG tablet TAKE ONE TABLET BY MOUTH THREE TIMES DAILY WITH MEALS 03/03/17  Yes Kathrine Haddock, NP  glimepiride (AMARYL) 4 MG tablet Take 1 tablet (4 mg total) by mouth daily. 09/09/16  Yes Kathrine Haddock, NP  glucose blood (ACCU-CHEK ACTIVE STRIPS) test strip Use as instructed 03/25/16  Yes Kathrine Haddock, NP  isosorbide mononitrate (IMDUR) 30 MG 24 hr tablet Take 1 tablet (30 mg total) by mouth daily. 11/08/16 03/17/17 Yes Wellington Hampshire, MD  JANUVIA 100 MG tablet TAKE ONE TABLET BY MOUTH ONCE DAILY 02/07/17  Yes Megan P Johnson, DO  loperamide (IMODIUM) 2 MG capsule Take 2 mg by mouth as needed for diarrhea or loose stools.   Yes Historical Provider, MD  meclizine (ANTIVERT) 32 MG tablet Take 1 tablet (32 mg total) by mouth 3 (three) times daily as needed. Patient  taking differently: Take 32 mg by mouth 3 (three) times daily as needed for dizziness or nausea.  06/21/16  Yes Kathrine Haddock, NP  metoCLOPramide (REGLAN) 5 MG tablet Take 1 tablet (5 mg total) by mouth 4 (four) times daily. 01/29/17  Yes Kathrine Haddock, NP  metoprolol tartrate (LOPRESSOR) 25 MG tablet Take 1 tablet (25 mg total) by mouth 2 (two) times daily. 11/25/16  Yes Wellington Hampshire, MD  nitroGLYCERIN (NITROSTAT) 0.4 MG SL tablet Place 1 tablet (0.4 mg total) under the tongue every 5 (five) minutes as needed for chest pain. Reported on 02/20/2016 03/17/17  Yes Alisa Graff, FNP  ondansetron (ZOFRAN ODT) 4 MG disintegrating tablet Take 1 tablet (4 mg total) by mouth every 8 (eight) hours as needed for nausea or vomiting. 12/08/16  Yes Paulette Blanch, MD  pantoprazole (PROTONIX) 40 MG tablet Take 1  tablet (40 mg total) by mouth daily at 6 (six) AM. 09/30/16  Yes Kathrine Haddock, NP  rosuvastatin (CRESTOR) 5 MG tablet Take 1 tablet (5 mg total) by mouth every other day. 12/11/16 03/17/17 Yes Wende Bushy, MD  venlafaxine XR (EFFEXOR-XR) 75 MG 24 hr capsule Take 75 mg by mouth 2 (two) times daily.    Yes Historical Provider, MD  Vitamin D, Ergocalciferol, (DRISDOL) 50000 units CAPS capsule Take 1 capsule (50,000 Units total) by mouth every 7 (seven) days. 12/27/16  Yes Kathrine Haddock, NP    Review of Systems  Constitutional: Positive for appetite change, fatigue and fever (every other week).  HENT: Negative for congestion, postnasal drip and sore throat.   Eyes: Negative.   Respiratory: Positive for cough and shortness of breath (at times). Negative for chest tightness.   Cardiovascular: Positive for chest pain. Negative for palpitations and leg swelling.  Gastrointestinal: Negative for abdominal distention and abdominal pain.  Endocrine: Negative.   Genitourinary: Negative.   Musculoskeletal: Negative for back pain and neck pain.  Skin: Negative.   Allergic/Immunologic: Negative.   Neurological: Positive  for light-headedness. Negative for dizziness.  Hematological: Negative for adenopathy. Does not bruise/bleed easily.  Psychiatric/Behavioral: Negative for dysphoric mood, sleep disturbance (wearing CPAP nightly) and suicidal ideas. The patient is nervous/anxious (at times).    Vitals:   03/17/17 0943  BP: (!) 151/91  Pulse: 94  Resp: 18  SpO2: 100%  Weight: 229 lb 4 oz (104 kg)  Height: 5\' 4"  (1.626 m)   Wt Readings from Last 3 Encounters:  03/17/17 229 lb 4 oz (104 kg)  03/11/17 223 lb (101.2 kg)  02/19/17 223 lb (101.2 kg)   Lab Results  Component Value Date   CREATININE 1.16 (H) 03/11/2017   CREATININE 1.29 (H) 01/17/2017   CREATININE 1.30 (H) 12/08/2016      Objective:   Physical Exam  Constitutional: She is oriented to person, place, and time. She appears well-developed and well-nourished.  HENT:  Head: Normocephalic and atraumatic.  Eyes: Conjunctivae are normal. Pupils are equal, round, and reactive to light.  Neck: Normal range of motion. Neck supple. No JVD present.  Cardiovascular: Normal rate and regular rhythm.   Pulmonary/Chest: Effort normal. She has no wheezes. She has no rales.  Abdominal: Soft. She exhibits no distension. There is no tenderness.  Musculoskeletal: She exhibits no edema or tenderness.  Neurological: She is alert and oriented to person, place, and time.  Skin: Skin is warm and dry.  Psychiatric: She has a normal mood and affect. Her behavior is normal. Thought content normal.  Nursing note and vitals reviewed.     Assessment & Plan:   1: Chronic heart failure with preserved ejection fraction- - NYHA Class III -Euvolemic today -Weight is stable. Reminded to call for an overnight weight gain of >2 pounds or a weekly weight gain of >5 pounds.  - has been trying to walk more   - Continues to not add salt to food - saw her cardiologist Fletcher Anon) on 11/08/16  2: HTN- - Blood pressure mildly elevated - Saw her PCP Julian Hy) on 11/05/16  3:  Angina- - She continues to have intermittent angina for which she'll take SL NTG on occasion.  - doesn't feel like the isosorbide is working as well. Offered to increase isosorbide or have her call her cardiologist to discuss possibly using ranexa. She opts to keep isosorbide dose the same and call her cardiologist to get an appointment scheduled.  - Call  cardiologist if her anginal pattern changes  4: Diabetes-  - Glucose has been "ok" - Last A1c on 09/02/16 was 6.9% - suspect gastroparesis  5: Chronic fatigue- - wearing CPAP for the last 1-2 months and reports feeling better - wears it about 7 hours each night  Patient did not bring her medications nor a list. Each medication was verbally reviewed with the patient and she was encouraged to bring the bottles to every visit to confirm accuracy of list.  Return in 3 months or sooner for any questions/problems before then.

## 2017-03-17 NOTE — Patient Instructions (Signed)
Continue weighing daily and call for an overnight weight gain of > 2 pounds or a weekly weight gain of >5 pounds. 

## 2017-03-20 ENCOUNTER — Encounter: Payer: Self-pay | Admitting: Cardiovascular Disease

## 2017-03-20 ENCOUNTER — Telehealth: Payer: Self-pay | Admitting: *Deleted

## 2017-03-20 NOTE — Telephone Encounter (Signed)
No answer. Left message to call back to discuss symptoms and email. I will also respond to the email.

## 2017-03-20 NOTE — Telephone Encounter (Signed)
Doris Cheadle Dante Gang  to Wellington Hampshire, MD       03/20/17 9:24 AM  I have been having constant chest pain and tightness even while resting or with minimal exertion my blood pressure has been consistently high even though I am sticking to the low sodium diet and my pulse has risen usually in the 100's should I start back taking the losarten and Coreg? I need advice

## 2017-03-21 ENCOUNTER — Ambulatory Visit (INDEPENDENT_AMBULATORY_CARE_PROVIDER_SITE_OTHER): Payer: Medicaid Other | Admitting: Cardiovascular Disease

## 2017-03-21 ENCOUNTER — Telehealth: Payer: Self-pay | Admitting: Cardiovascular Disease

## 2017-03-21 ENCOUNTER — Encounter: Payer: Self-pay | Admitting: Cardiovascular Disease

## 2017-03-21 VITALS — BP 130/88 | HR 100 | Ht 65.0 in | Wt 223.5 lb

## 2017-03-21 DIAGNOSIS — I10 Essential (primary) hypertension: Secondary | ICD-10-CM

## 2017-03-21 DIAGNOSIS — I5032 Chronic diastolic (congestive) heart failure: Secondary | ICD-10-CM | POA: Diagnosis not present

## 2017-03-21 DIAGNOSIS — E785 Hyperlipidemia, unspecified: Secondary | ICD-10-CM

## 2017-03-21 DIAGNOSIS — I25118 Atherosclerotic heart disease of native coronary artery with other forms of angina pectoris: Secondary | ICD-10-CM | POA: Diagnosis not present

## 2017-03-21 MED ORDER — METOPROLOL TARTRATE 50 MG PO TABS: 50.0000 mg | ORAL_TABLET | Freq: Two times a day (BID) | ORAL | 1 refills | Status: DC

## 2017-03-21 NOTE — Telephone Encounter (Signed)
Pt reports elevated BP and HR w/periodic episodes of chest pain and SOB. Mentioned to [facility] at recent [place]. Educations regarding angina given to pt. Denies CP at this time. She is agreeable to appt today w/Dr. Fletcher Anon at 3pm. She will bring a copy of heart rate readings. BP readings in MyChart message to be printed.

## 2017-03-21 NOTE — Progress Notes (Signed)
Cardiology Office Note   Date:  03/21/2017   ID:  [name], [name] [birth year], MRN [id number]  PCP:  Kathrine Haddock, NP  Cardiologist:   Kathlyn Sacramento, MD   Chief Complaint  Patient presents with  . other    Early follow up. Patient c/o Chest pain and high blood pressure. Meds reviewed verbally with patient.       History of Present Illness: [name] [name] is a 36 y.o. female who presents for a follow-up visit regarding coronary artery disease and chronic systolic heart failure. She has prolonged history of type 2 diabetes since 2002 , prolonged history of tobacco use, and obesity.  She presented to [facility] in April of 2016 with NSTEMI. Echocardiogram showed an ejection fraction of 30% with severe inferior, inferolateral and lateral wall hypokinesis with moderate mitral regurgitation. Cardiac catheterization showed occluded mid left circumflex with significant disease affecting the proximal OM1. There was mild disease affecting the LAD and minor irregularities in the RCA. Ejection fraction was 35%. I performed successful angioplasty and drug-eluting stent placement to the mid LCX and proximal OM . She underwent a repeat cardiac catheterization in May 2016 which showed patent stents in the left circumflex and OM1. Ejection fraction was 50-55%.  Most recent echocardiogram in October 2017 showed normal LV systolic function with an EF of 55-60%. She had sinus tachycardia that improved after switching carvedilol to metoprolol.  She is known to have chronic stable angina and was started on Imdur during last visit. She reports improvement of chest pain initially especially after she started using CPAP for sleep apnea. However, over the last few weeks her blood pressure started increasing with worsening chest pain and headaches. She has been taking her medications regularly.   Past Medical History:  Diagnosis Date  . Chronic systolic CHF (congestive heart failure) ([place])    a. echo 03/2015: EF  30-35%, mild concentric LVH, severe HK of inf, inflat, & lat walls, mod MR, mild TR;  b. 04/2015 LV Gram: EF 55-65%.  . Diabetes type 2, controlled ([place])    a. since 2002   . Heavy menses    a. H/O IUD - expired in 2014 - remains in place.  . Hyperlipidemia   . Hypertension   . Iron deficiency anemia   . Ischemic cardiomyopathy    a. 03/2015 EF 30-35% post NSTEMI;  b. 04/2015 EF 55-65% on LV gram.  . Kidney disease   . MI (myocardial infarction) 03/29/2015  . Obesity   . Renal insufficiency   . Tobacco abuse    a. quit 03/2015.  Marland Kitchen Uterine fibroid   . Vertigo   . Vitamin D deficiency     Past Surgical History:  Procedure Laterality Date  . APPENDECTOMY    . CARDIAC CATHETERIZATION  4/16   x2 stent [facility]  . CARDIAC CATHETERIZATION N/A 05/05/2015   Procedure: Left Heart Cath and Coronary Angiography;  Surgeon: Peter M Martinique, MD;  Location: [facility];  Service: Cardiovascular;  Laterality: N/A;  . COLONOSCOPY WITH PROPOFOL N/A 07/31/2016   Procedure: COLONOSCOPY WITH PROPOFOL;  Surgeon: Lollie Sails, MD;  Location: [facility] ENDOSCOPY;  Service: Endoscopy;  Laterality: N/A;  . ESOPHAGOGASTRODUODENOSCOPY (EGD) WITH PROPOFOL N/A 07/31/2016   Procedure: ESOPHAGOGASTRODUODENOSCOPY (EGD) WITH PROPOFOL;  Surgeon: Lollie Sails, MD;  Location: [facility] ENDOSCOPY;  Service: Endoscopy;  Laterality: N/A;  . LAPAROSCOPIC APPENDECTOMY N/A 08/07/2016   Procedure: APPENDECTOMY LAPAROSCOPIC;  Surgeon: Hubbard Robinson, MD;  Location: [facility] ORS;  Service:  General;  Laterality: N/A;  . MOUTH SURGERY       Current Outpatient Prescriptions  Medication Sig Dispense Refill  . albuterol (PROVENTIL HFA;VENTOLIN HFA) 108 (90 Base) MCG/ACT inhaler Inhale 2 puffs into the lungs every 6 (six) hours as needed for wheezing or shortness of breath. 1 Inhaler 2  . amitriptyline (ELAVIL) 10 MG tablet Take 1 tablet (10 mg total) by mouth at bedtime. 60 tablet 2  . aspirin EC 81 MG tablet Take 81 mg by mouth  daily.    . benzonatate (TESSALON) 100 MG capsule Take 1 capsule (100 mg total) by mouth 2 (two) times daily as needed for cough. 30 capsule 12  . Dulaglutide (TRULICITY) 4.54 UJ/8.1XB SOPN Inject 0.75 mg into the skin once a week. 4 pen 3  . etonogestrel (NEXPLANON) 68 MG IMPL implant 1 each by Subdermal route once.    . ferrous sulfate 325 (65 FE) MG tablet TAKE ONE TABLET BY MOUTH THREE TIMES DAILY WITH MEALS 90 tablet 3  . glimepiride (AMARYL) 4 MG tablet Take 1 tablet (4 mg total) by mouth daily. 90 tablet 1  . glucose blood (ACCU-CHEK ACTIVE STRIPS) test strip Use as instructed 100 each 12  . JANUVIA 100 MG tablet TAKE ONE TABLET BY MOUTH ONCE DAILY 30 tablet 2  . loperamide (IMODIUM) 2 MG capsule Take 2 mg by mouth as needed for diarrhea or loose stools.    . meclizine (ANTIVERT) 32 MG tablet Take 1 tablet (32 mg total) by mouth 3 (three) times daily as needed. (Patient taking differently: Take 32 mg by mouth 3 (three) times daily as needed for dizziness or nausea. ) 30 tablet 0  . metoCLOPramide (REGLAN) 5 MG tablet Take 1 tablet (5 mg total) by mouth 4 (four) times daily. 120 tablet 12  . metoprolol tartrate (LOPRESSOR) 25 MG tablet Take 1 tablet (25 mg total) by mouth 2 (two) times daily. 60 tablet 3  . nitroGLYCERIN (NITROSTAT) 0.4 MG SL tablet Place 1 tablet (0.4 mg total) under the tongue every 5 (five) minutes as needed for chest pain. Reported on 02/20/2016 50 tablet 3  . ondansetron (ZOFRAN ODT) 4 MG disintegrating tablet Take 1 tablet (4 mg total) by mouth every 8 (eight) hours as needed for nausea or vomiting. 15 tablet 0  . pantoprazole (PROTONIX) 40 MG tablet Take 1 tablet (40 mg total) by mouth daily at 6 (six) AM. 90 tablet 1  . venlafaxine XR (EFFEXOR-XR) 75 MG 24 hr capsule Take 75 mg by mouth 2 (two) times daily.     . Vitamin D, Ergocalciferol, (DRISDOL) 50000 units CAPS capsule Take 1 capsule (50,000 Units total) by mouth every 7 (seven) days. 12 capsule 0  . isosorbide  mononitrate (IMDUR) 30 MG 24 hr tablet Take 1 tablet (30 mg total) by mouth daily. 30 tablet 5  . rosuvastatin (CRESTOR) 5 MG tablet Take 1 tablet (5 mg total) by mouth every other day. 30 tablet 5   No current facility-administered medications for this visit.     Allergies:   Lisinopril; Rosemary oil; Shellfish allergy; and Metformin and related    Social History:  The patient  reports that she quit smoking about 2 years ago. She quit after 15.00 years of use. She has never used smokeless tobacco. She reports that she does not drink alcohol or use drugs.   Family History:  The patient's family history includes Cancer in her maternal grandfather and maternal grandmother; Cancer - Cervical in her maternal aunt;  Diabetes in her father and paternal grandfather.    ROS:  Please see the history of present illness.   Otherwise, review of systems are positive for none.   All other systems are reviewed and negative.    PHYSICAL EXAM: VS:  BP 130/88 (BP Location: Left Arm, Patient Position: Sitting, Cuff Size: Normal)   Pulse 100   Ht 5\' 5"  (1.651 m)   Wt 223 lb 8 oz (101.4 kg)   BMI 37.19 kg/m  , BMI Body mass index is 37.19 kg/m. GEN: Well nourished, well developed, in no acute distress  HEENT: normal  Neck: no JVD, carotid bruits, or masses Cardiac:Mildly tachycardic; no murmurs, rubs, or gallops,no edema  Respiratory:  clear to auscultation bilaterally, normal work of breathing GI: soft, nontender, nondistended, + BS MS: no deformity or atrophy  Skin: warm and dry, no rash Neuro:  Strength and sensation are intact Psych: euthymic mood, full affect   EKG:  EKG is  ordered today. EKG showed normal sinus rhythm with no significant ST or T wave changes.   Recent Labs: 08/06/2016: Magnesium 1.5 12/16/2016: TSH 3.243 03/11/2017: ALT 22; BUN 28; Creatinine, Ser 1.16; Hemoglobin 11.1; Platelets 363; Potassium 4.8; Sodium 136    Lipid Panel    Component Value Date/Time   CHOL 138  01/10/2017 0916   CHOL 106 08/30/2015 1439   CHOL 133 03/31/2015 0225   TRIG 84 01/10/2017 0916   TRIG 46 08/30/2015 1439   TRIG 77 03/31/2015 0225   HDL 39 (L) 01/10/2017 0916   HDL 26 (L) 03/31/2015 0225   CHOLHDL 3.5 01/10/2017 0916   VLDL 9 08/30/2015 1439   VLDL 15 03/31/2015 0225   LDLCALC 82 01/10/2017 0916   LDLCALC 92 03/31/2015 0225      Wt Readings from Last 3 Encounters:  03/21/17 223 lb 8 oz (101.4 kg)  03/17/17 229 lb 4 oz (104 kg)  03/11/17 223 lb (101.2 kg)        ASSESSMENT AND PLAN:  1.  Coronary artery disease involving native coronary arteries with stable angina:  Recent worsening of anginal symptoms with uncontrolled blood pressure. I elected to increase metoprolol to 50 mg twice daily. Continue Imdur 30 mg once daily. If angina continues in spite of controlling blood pressure, we can consider adding Ranexa.  2. Chronic systolic heart failure: Ejection fraction improved to normal with most recent ejection fraction of 55-60%.  No need for diuretics.    3. Essential hypertension: Blood pressure is elevated. Metoprolol was increased.  4. Hyperlipidemia:    She is tolerating small dose rosuvastatin with significant improvement in lipid profile.  Disposition:   FU with me in 1 months  Signed,  Kathlyn Sacramento, MD  03/21/2017 3:30 PM    [place]

## 2017-03-21 NOTE — Patient Instructions (Signed)
Medication Instructions:  Your physician has recommended you make the following change in your medication:  INCREASE metoprolol to 50mg  twice daily   Labwork: none  Testing/Procedures: none  Follow-Up: Your physician recommends that you schedule a follow-up appointment in: one month with Dr. Fletcher Anon.    Any Other Special Instructions Will Be Listed Below (If Applicable).     If you need a refill on your cardiac medications before your next appointment, please call your pharmacy.

## 2017-03-23 ENCOUNTER — Encounter: Payer: Self-pay | Admitting: Cardiovascular Disease

## 2017-03-27 ENCOUNTER — Encounter: Payer: Self-pay | Admitting: Cardiovascular Disease

## 2017-03-27 ENCOUNTER — Other Ambulatory Visit: Payer: Self-pay | Admitting: Cardiovascular Disease

## 2017-03-27 ENCOUNTER — Telehealth: Payer: Self-pay | Admitting: Gastroenterology

## 2017-03-27 NOTE — Telephone Encounter (Signed)
Referral received for patient to be seen for severe diarrhea. Patient was previously sch'd with Dr.Armbruster but canceled and was then seen at [facility]. Gi records from other office faxed over for review and placed on Dr.Armbruster's desk.

## 2017-04-02 NOTE — Telephone Encounter (Signed)
Dr. Havery Moros reviewed records and patient needs a OV. LM on vmail to call back to schedule.  Records in records reviewed folder

## 2017-04-04 ENCOUNTER — Encounter: Payer: Self-pay | Admitting: Gastroenterology

## 2017-04-22 ENCOUNTER — Other Ambulatory Visit
Admission: RE | Admit: 2017-04-22 | Discharge: 2017-04-22 | Disposition: A | Discharge status: Home / Self Care | Payer: Medicaid Other | Source: Ambulatory Visit | Attending: Cardiovascular Disease | Admitting: Cardiovascular Disease

## 2017-04-22 ENCOUNTER — Ambulatory Visit (INDEPENDENT_AMBULATORY_CARE_PROVIDER_SITE_OTHER): Payer: Medicaid Other | Admitting: Cardiovascular Disease

## 2017-04-22 ENCOUNTER — Encounter: Payer: Self-pay | Admitting: Cardiovascular Disease

## 2017-04-22 VITALS — BP 120/84 | HR 93 | Ht 65.0 in | Wt 229.5 lb

## 2017-04-22 DIAGNOSIS — I25118 Atherosclerotic heart disease of native coronary artery with other forms of angina pectoris: Secondary | ICD-10-CM

## 2017-04-22 DIAGNOSIS — I10 Essential (primary) hypertension: Secondary | ICD-10-CM | POA: Diagnosis not present

## 2017-04-22 DIAGNOSIS — E785 Hyperlipidemia, unspecified: Secondary | ICD-10-CM

## 2017-04-22 DIAGNOSIS — R42 Dizziness and giddiness: Secondary | ICD-10-CM | POA: Diagnosis not present

## 2017-04-22 DIAGNOSIS — I5022 Chronic systolic (congestive) heart failure: Secondary | ICD-10-CM

## 2017-04-22 LAB — BASIC METABOLIC PANEL
Anion gap: 7 (ref 5–15)
BUN: 25 mg/dL — ABNORMAL HIGH (ref 6–20)
CO2: 25 mmol/L (ref 22–32)
Calcium: 8.9 mg/dL (ref 8.9–10.3)
Chloride: 107 mmol/L (ref 101–111)
Creatinine, Ser: 1.42 mg/dL — ABNORMAL HIGH (ref 0.44–1.00)
GFR calc Af Amer: 54 mL/min — ABNORMAL LOW (ref >60)
GFR calc non Af Amer: 46 mL/min — ABNORMAL LOW (ref >60)
Glucose, Bld: 172 mg/dL — ABNORMAL HIGH (ref 65–99)
Potassium: 3.8 mmol/L (ref 3.5–5.1)
Sodium: 139 mmol/L (ref 135–145)

## 2017-04-22 LAB — CBC
HCT: 34.8 % — ABNORMAL LOW (ref 35.0–47.0)
Hemoglobin: 11.4 g/dL — ABNORMAL LOW (ref 12.0–16.0)
MCH: 25.9 pg — ABNORMAL LOW (ref 26.0–34.0)
MCHC: 32.9 g/dL (ref 32.0–36.0)
MCV: 78.6 fL — ABNORMAL LOW (ref 80.0–100.0)
Platelets: 406 10*3/uL (ref 150–440)
RBC: 4.43 MIL/uL (ref 3.80–5.20)
RDW: 14.1 % (ref 11.5–14.5)
WBC: 9.7 10*3/uL (ref 3.6–11.0)

## 2017-04-22 NOTE — Patient Instructions (Signed)
Medication Instructions:  Your physician recommends that you continue on your current medications as directed. Please refer to the Current Medication list given to you today.   Labwork: BMET and CBC today  Testing/Procedures: none  Follow-Up: Your physician recommends that you schedule a follow-up appointment in: 3 months with Dr. Fletcher Anon.    Any Other Special Instructions Will Be Listed Below (If Applicable).     If you need a refill on your cardiac medications before your next appointment, please call your pharmacy.

## 2017-04-22 NOTE — Progress Notes (Signed)
Cardiology Office Note   Date:  04/22/2017   ID:  [name], [name] 1981-10-17, MRN [id number]  PCP:  Kathrine Haddock, NP  Cardiologist:   Kathlyn Sacramento, MD   Chief Complaint  Patient presents with  . other    1 month follow up. Patient c/o SOB and Chest pain. Patient states one of her medication is making her dizzy.       History of Present Illness: [name] [name] is a 36 y.o. female who presents for a follow-up visit regarding coronary artery disease and chronic systolic heart failure. She has prolonged history of type 2 diabetes since 2002 , prolonged history of tobacco use, and obesity.  She presented to [facility] in April of 2016 with NSTEMI. Echocardiogram showed an ejection fraction of 30% with severe inferior, inferolateral and lateral wall hypokinesis with moderate mitral regurgitation. Cardiac catheterization showed occluded mid left circumflex with significant disease affecting the proximal OM1. There was mild disease affecting the LAD and minor irregularities in the RCA. Ejection fraction was 35%. I performed successful angioplasty and drug-eluting stent placement to the mid LCX and proximal OM . She underwent a repeat cardiac catheterization in May 2016 which showed patent stents in the left circumflex and OM1. Ejection fraction was 50-55%.  Most recent echocardiogram in October 2017 showed normal LV systolic function with an EF of 55-60%. She had sinus tachycardia that improved after switching carvedilol to metoprolol.  She is known to have chronic stable angina Which worsened recently in the setting of uncontrolled hypertension. During last visit, I increased metoprolol to 50 mg twice daily. Since then, blood pressure improved and also the frequency of her chest discomfort improved with that. She is bending experiencing dizziness described as spinning sensation and poor balance over the last few months. She had an episode while she was coming to her appointment and fell in the  parking lot with some superficial laceration on her left arm. She did not have syncope. We checked her blood sugar and it was 170.   Past Medical History:  Diagnosis Date  . Chronic systolic CHF (congestive heart failure) ([place])    a. echo 03/2015: EF 30-35%, mild concentric LVH, severe HK of inf, inflat, & lat walls, mod MR, mild TR;  b. 04/2015 LV Gram: EF 55-65%.  . Diabetes type 2, controlled ([place])    a. since 2002   . Heavy menses    a. H/O IUD - expired in 2014 - remains in place.  . Hyperlipidemia   . Hypertension   . Iron deficiency anemia   . Ischemic cardiomyopathy    a. 03/2015 EF 30-35% post NSTEMI;  b. 04/2015 EF 55-65% on LV gram.  . Kidney disease   . MI (myocardial infarction) ([place]) 03/29/2015  . Obesity   . Renal insufficiency   . Tobacco abuse    a. quit 03/2015.  Marland Kitchen Uterine fibroid   . Vertigo   . Vitamin D deficiency     Past Surgical History:  Procedure Laterality Date  . APPENDECTOMY    . CARDIAC CATHETERIZATION  4/16   x2 stent [facility]  . CARDIAC CATHETERIZATION N/A 05/05/2015   Procedure: Left Heart Cath and Coronary Angiography;  Surgeon: Peter M Martinique, MD;  Location: [place] CV LAB;  Service: Cardiovascular;  Laterality: N/A;  . COLONOSCOPY WITH PROPOFOL N/A 07/31/2016   Procedure: COLONOSCOPY WITH PROPOFOL;  Surgeon: Lollie Sails, MD;  Location: [facility] ENDOSCOPY;  Service: Endoscopy;  Laterality: N/A;  . ESOPHAGOGASTRODUODENOSCOPY (EGD) WITH  PROPOFOL N/A 07/31/2016   Procedure: ESOPHAGOGASTRODUODENOSCOPY (EGD) WITH PROPOFOL;  Surgeon: Lollie Sails, MD;  Location: [facility] ENDOSCOPY;  Service: Endoscopy;  Laterality: N/A;  . LAPAROSCOPIC APPENDECTOMY N/A 08/07/2016   Procedure: APPENDECTOMY LAPAROSCOPIC;  Surgeon: Hubbard Robinson, MD;  Location: [facility] ORS;  Service: General;  Laterality: N/A;  . MOUTH SURGERY       Current Outpatient Prescriptions  Medication Sig Dispense Refill  . albuterol (PROVENTIL HFA;VENTOLIN HFA) 108 (90 Base) MCG/ACT  inhaler Inhale 2 puffs into the lungs every 6 (six) hours as needed for wheezing or shortness of breath. 1 Inhaler 2  . amitriptyline (ELAVIL) 10 MG tablet Take 1 tablet (10 mg total) by mouth at bedtime. 60 tablet 2  . aspirin EC 81 MG tablet Take 81 mg by mouth daily.    . benzonatate (TESSALON) 100 MG capsule Take 1 capsule (100 mg total) by mouth 2 (two) times daily as needed for cough. 30 capsule 12  . Dulaglutide (TRULICITY) 0.86 VH/8.4ON SOPN Inject 0.75 mg into the skin once a week. 4 pen 3  . etonogestrel (NEXPLANON) 68 MG IMPL implant 1 each by Subdermal route once.    . ferrous sulfate 325 (65 FE) MG tablet TAKE ONE TABLET BY MOUTH THREE TIMES DAILY WITH MEALS 90 tablet 3  . glimepiride (AMARYL) 4 MG tablet Take 1 tablet (4 mg total) by mouth daily. 90 tablet 1  . glucose blood (ACCU-CHEK ACTIVE STRIPS) test strip Use as instructed 100 each 12  . JANUVIA 100 MG tablet TAKE ONE TABLET BY MOUTH ONCE DAILY 30 tablet 2  . loperamide (IMODIUM) 2 MG capsule Take 2 mg by mouth as needed for diarrhea or loose stools.    . meclizine (ANTIVERT) 32 MG tablet Take 1 tablet (32 mg total) by mouth 3 (three) times daily as needed. (Patient taking differently: Take 32 mg by mouth 3 (three) times daily as needed for dizziness or nausea. ) 30 tablet 0  . metoCLOPramide (REGLAN) 5 MG tablet Take 1 tablet (5 mg total) by mouth 4 (four) times daily. 120 tablet 12  . metoprolol (LOPRESSOR) 50 MG tablet Take 1 tablet (50 mg total) by mouth 2 (two) times daily. 180 tablet 1  . nitroGLYCERIN (NITROSTAT) 0.4 MG SL tablet Place 1 tablet (0.4 mg total) under the tongue every 5 (five) minutes as needed for chest pain. Reported on 02/20/2016 50 tablet 3  . ondansetron (ZOFRAN ODT) 4 MG disintegrating tablet Take 1 tablet (4 mg total) by mouth every 8 (eight) hours as needed for nausea or vomiting. 15 tablet 0  . pantoprazole (PROTONIX) 40 MG tablet Take 1 tablet (40 mg total) by mouth daily at 6 (six) AM. 90 tablet 1    . venlafaxine XR (EFFEXOR-XR) 75 MG 24 hr capsule Take 75 mg by mouth 2 (two) times daily.     . Vitamin D, Ergocalciferol, (DRISDOL) 50000 units CAPS capsule Take 1 capsule (50,000 Units total) by mouth every 7 (seven) days. 12 capsule 0  . isosorbide mononitrate (IMDUR) 30 MG 24 hr tablet Take 1 tablet (30 mg total) by mouth daily. 30 tablet 5  . rosuvastatin (CRESTOR) 5 MG tablet Take 1 tablet (5 mg total) by mouth every other day. 30 tablet 5   No current facility-administered medications for this visit.     Allergies:   Lisinopril; Rosemary oil; Shellfish allergy; and Metformin and related    Social History:  The patient  reports that she quit smoking about 2 years ago.  She quit after 15.00 years of use. She has never used smokeless tobacco. She reports that she does not drink alcohol or use drugs.   Family History:  The patient's family history includes Cancer in her maternal grandfather and maternal grandmother; Cancer - Cervical in her maternal aunt; Diabetes in her father and paternal grandfather.    ROS:  Please see the history of present illness.   Otherwise, review of systems are positive for none.   All other systems are reviewed and negative.    PHYSICAL EXAM: VS:  BP 120/84 (BP Location: Right Arm, Patient Position: Sitting, Cuff Size: Normal)   Pulse 93   Ht 5\' 5"  (1.651 m)   Wt 229 lb 8 oz (104.1 kg)   BMI 38.19 kg/m  , BMI Body mass index is 38.19 kg/m. GEN: Well nourished, well developed, in no acute distress  HEENT: normal  Neck: no JVD, carotid bruits, or masses Cardiac:Mildly tachycardic; no murmurs, rubs, or gallops,no edema  Respiratory:  clear to auscultation bilaterally, normal work of breathing GI: soft, nontender, nondistended, + BS MS: no deformity or atrophy  Skin: warm and dry, no rash Neuro:  Strength and sensation are intact Psych: euthymic mood, full affect   EKG:  EKG is  ordered today. EKG showed normal sinus rhythm with no significant ST  or T wave changes. Low voltage.   Recent Labs: 08/06/2016: Magnesium 1.5 12/16/2016: TSH 3.243 03/11/2017: ALT 22; BUN 28; Creatinine, Ser 1.16; Hemoglobin 11.1; Platelets 363; Potassium 4.8; Sodium 136    Lipid Panel    Component Value Date/Time   CHOL 138 01/10/2017 0916   CHOL 106 08/30/2015 1439   CHOL 133 03/31/2015 0225   TRIG 84 01/10/2017 0916   TRIG 46 08/30/2015 1439   TRIG 77 03/31/2015 0225   HDL 39 (L) 01/10/2017 0916   HDL 26 (L) 03/31/2015 0225   CHOLHDL 3.5 01/10/2017 0916   VLDL 9 08/30/2015 1439   VLDL 15 03/31/2015 0225   LDLCALC 82 01/10/2017 0916   LDLCALC 92 03/31/2015 0225      Wt Readings from Last 3 Encounters:  04/22/17 229 lb 8 oz (104.1 kg)  03/21/17 223 lb 8 oz (101.4 kg)  03/17/17 229 lb 4 oz (104 kg)        ASSESSMENT AND PLAN:  1. Dizziness and possible vertigo: The patient had an episode of falling for her appointment which was associated with dizziness and possible vertigo without syncope. Her blood sugar was 170. She is not hypotensive or bradycardic and there is no evidence of arrhythmia on EKG. I'm going to check CBC and basic metabolic profile to evaluate for possible anemia or volume depletion. Some of her symptoms are suggestive of orthostatic dizziness but she also has vertigo. Consider ENT evaluation.  2. Coronary artery disease involving native coronary arteries with stable angina:   Her angina improved with improvement in blood pressure. Continue same medications for now.  3. Chronic systolic heart failure: Ejection fraction improved to normal with most recent ejection fraction of 55-60%.  No need for diuretics.    4. Essential hypertension: Blood pressure is well controlled on current medications.  4. Hyperlipidemia:    Continue small dose rosuvastatin.  Disposition:   FU with me in 3 months  Signed,  Kathlyn Sacramento, MD  04/22/2017 3:17 PM    [place]

## 2017-04-23 ENCOUNTER — Emergency Department
Admission: EM | Admit: 2017-04-23 | Discharge: 2017-04-23 | Disposition: A | Discharge status: Home / Self Care | Payer: Medicaid Other | Attending: Emergency Medicine | Admitting: Emergency Medicine

## 2017-04-23 ENCOUNTER — Emergency Department: Payer: Medicaid Other

## 2017-04-23 ENCOUNTER — Telehealth: Payer: Self-pay | Admitting: Unknown Physician Specialty

## 2017-04-23 ENCOUNTER — Encounter: Payer: Self-pay | Admitting: Emergency Medicine

## 2017-04-23 DIAGNOSIS — S8391XA Sprain of unspecified site of right knee, initial encounter: Secondary | ICD-10-CM | POA: Diagnosis not present

## 2017-04-23 DIAGNOSIS — Y999 Unspecified external cause status: Secondary | ICD-10-CM | POA: Diagnosis not present

## 2017-04-23 DIAGNOSIS — I110 Hypertensive heart disease with heart failure: Secondary | ICD-10-CM | POA: Insufficient documentation

## 2017-04-23 DIAGNOSIS — Z87891 Personal history of nicotine dependence: Secondary | ICD-10-CM | POA: Insufficient documentation

## 2017-04-23 DIAGNOSIS — I252 Old myocardial infarction: Secondary | ICD-10-CM | POA: Insufficient documentation

## 2017-04-23 DIAGNOSIS — E119 Type 2 diabetes mellitus without complications: Secondary | ICD-10-CM | POA: Insufficient documentation

## 2017-04-23 DIAGNOSIS — I5022 Chronic systolic (congestive) heart failure: Secondary | ICD-10-CM | POA: Insufficient documentation

## 2017-04-23 DIAGNOSIS — Z7984 Long term (current) use of oral hypoglycemic drugs: Secondary | ICD-10-CM | POA: Insufficient documentation

## 2017-04-23 DIAGNOSIS — N390 Urinary tract infection, site not specified: Secondary | ICD-10-CM

## 2017-04-23 DIAGNOSIS — Y939 Activity, unspecified: Secondary | ICD-10-CM | POA: Insufficient documentation

## 2017-04-23 DIAGNOSIS — S8991XA Unspecified injury of right lower leg, initial encounter: Secondary | ICD-10-CM | POA: Diagnosis present

## 2017-04-23 DIAGNOSIS — Z79899 Other long term (current) drug therapy: Secondary | ICD-10-CM | POA: Insufficient documentation

## 2017-04-23 DIAGNOSIS — Y929 Unspecified place or not applicable: Secondary | ICD-10-CM | POA: Diagnosis not present

## 2017-04-23 DIAGNOSIS — R42 Dizziness and giddiness: Secondary | ICD-10-CM | POA: Insufficient documentation

## 2017-04-23 DIAGNOSIS — W19XXXA Unspecified fall, initial encounter: Secondary | ICD-10-CM | POA: Insufficient documentation

## 2017-04-23 DIAGNOSIS — W19XXXD Unspecified fall, subsequent encounter: Secondary | ICD-10-CM

## 2017-04-23 LAB — URINALYSIS, COMPLETE (UACMP) WITH MICROSCOPIC
Bilirubin Urine: NEGATIVE
Glucose, UA: 50 mg/dL — AB
Ketones, ur: NEGATIVE mg/dL
Nitrite: NEGATIVE
Protein, ur: 100 mg/dL — AB
Specific Gravity, Urine: 1.019 (ref 1.005–1.030)
pH: 5 (ref 5.0–8.0)

## 2017-04-23 LAB — BASIC METABOLIC PANEL
Anion gap: 6 (ref 5–15)
BUN: 34 mg/dL — ABNORMAL HIGH (ref 6–20)
CO2: 24 mmol/L (ref 22–32)
Calcium: 9 mg/dL (ref 8.9–10.3)
Chloride: 109 mmol/L (ref 101–111)
Creatinine, Ser: 1.26 mg/dL — ABNORMAL HIGH (ref 0.44–1.00)
GFR calc Af Amer: >60 mL/min (ref >60)
GFR calc non Af Amer: 54 mL/min — ABNORMAL LOW (ref >60)
Glucose, Bld: 187 mg/dL — ABNORMAL HIGH (ref 65–99)
Potassium: 4.2 mmol/L (ref 3.5–5.1)
Sodium: 139 mmol/L (ref 135–145)

## 2017-04-23 LAB — CBC
HCT: 31.6 % — ABNORMAL LOW (ref 35.0–47.0)
Hemoglobin: 10.5 g/dL — ABNORMAL LOW (ref 12.0–16.0)
MCH: 26.4 pg (ref 26.0–34.0)
MCHC: 33.3 g/dL (ref 32.0–36.0)
MCV: 79.2 fL — ABNORMAL LOW (ref 80.0–100.0)
Platelets: 338 10*3/uL (ref 150–440)
RBC: 3.99 MIL/uL (ref 3.80–5.20)
RDW: 13.9 % (ref 11.5–14.5)
WBC: 9.6 10*3/uL (ref 3.6–11.0)

## 2017-04-23 LAB — POC URINE PREG, ED: Preg Test, Ur: NEGATIVE

## 2017-04-23 MED ORDER — OXYCODONE-ACETAMINOPHEN 5-325 MG PO TABS
1.0000 | ORAL_TABLET | Freq: Once | ORAL | Status: AC
  Administered 2017-04-23: 1 via ORAL
  Filled 2017-04-23: qty 1

## 2017-04-23 MED ORDER — CEPHALEXIN 500 MG PO CAPS
500.0000 mg | ORAL_CAPSULE | Freq: Once | ORAL | Status: AC
  Administered 2017-04-23: 500 mg via ORAL
  Filled 2017-04-23: qty 1

## 2017-04-23 MED ORDER — NAPROXEN 375 MG PO TABS: 375.0000 mg | ORAL_TABLET | Freq: Two times a day (BID) | ORAL | 0 refills | Status: DC

## 2017-04-23 MED ORDER — CEPHALEXIN 500 MG PO CAPS: 500.0000 mg | ORAL_CAPSULE | Freq: Three times a day (TID) | ORAL | 0 refills | Status: AC

## 2017-04-23 NOTE — ED Provider Notes (Addendum)
[facility] Emergency Department Provider Note  ____________________________________________   I have reviewed the triage vital signs and the nursing notes.   HISTORY  Chief Complaint Fall and Dizziness    HPI [name] [name] is a 36 y.o. female who presents today complaining of knee pain. Patient had a fall yesterday. She did not pass out. Patient states she has a history of being "dizzy" with vertigo and dehydration and multiple other vertigo symptoms for years and years. She sees a cardiologist and primary care for this. She states she feels exactly the same and she always feels in that respect however she states that she did fall yesterday. This is not unusual for her apparently. She has had no recent changes in her blood pressure medication although about a month ago they did increase her blood pressure medication at her cardiologist's office. She has no chest pain she has no shortness of breath, she is eating and drinking well. She does state however that she has trouble walking on her knee after her fall. She is essentially here because of her knee. She does not wish any further workup of her chronic dizziness. She states it's been like that for "so many years" and she does not wish me to further assess that. Patient did have a scrape on her left elbow and on her knee. Her tetanus shot is up-to-date. She denies any other injury. She has been weightbearing the difficulty since the accident. The fall happened nearly 24 hours ago, she is not currently dizzy, she did go to her primary care doctor after the fall and they felt that she was at her baseline. They have made no changes in her chronic management of this issue. She has not had any trouble with eating or drinking she denies any focal neurologic deficit or true vertigo at this time she states that she did trip but she felt that her chronic dizziness probably made it worse.     Past Medical History:  Diagnosis  Date  . Chronic systolic CHF (congestive heart failure) ([place])    a. echo 03/2015: EF 30-35%, mild concentric LVH, severe HK of inf, inflat, & lat walls, mod MR, mild TR;  b. 04/2015 LV Gram: EF 55-65%.  . Diabetes type 2, controlled ([place])    a. since 2002   . Heavy menses    a. H/O IUD - expired in 2014 - remains in place.  . Hyperlipidemia   . Hypertension   . Iron deficiency anemia   . Ischemic cardiomyopathy    a. 03/2015 EF 30-35% post NSTEMI;  b. 04/2015 EF 55-65% on LV gram.  . Kidney disease   . MI (myocardial infarction) ([place]) 03/29/2015  . Obesity   . Renal insufficiency   . Tobacco abuse    a. quit 03/2015.  Marland Kitchen Uterine fibroid   . Vertigo   . Vitamin D deficiency     Patient Active Problem List   Diagnosis Date Noted  . Neuropathy 01/17/2017  . Edema of both ankles 01/17/2017  . Fatigue 12/16/2016  . Myalgic 10/25/2016  . Vitamin D deficiency 10/25/2016  . Insomnia 10/25/2016  . Urinary tract infection 09/20/2016  . HTN (hypertension) 09/05/2016  . Vertigo 06/21/2016  . Elevated liver function tests 06/05/2016  . Chronic diastolic heart failure ([place]) 03/19/2016  . Tachycardia 03/19/2016  . IBS (irritable bowel syndrome) 12/15/2015  . Proteinuria 08/30/2015  . Coronary artery disease involving native coronary artery of native heart with angina pectoris with documented spasm (  [place])   . Angina pectoris ([place])   . Iron deficiency anemia   . Heavy menses   . SOB (shortness of breath) 08/21/2015  . Hyperlipidemia 04/13/2015  . Ischemic cardiomyopathy   . Diabetes type 2, controlled ([place])   . Obesity     Past Surgical History:  Procedure Laterality Date  . APPENDECTOMY    . CARDIAC CATHETERIZATION  4/16   x2 stent [facility]  . CARDIAC CATHETERIZATION N/A 05/05/2015   Procedure: Left Heart Cath and Coronary Angiography;  Surgeon: Peter M Martinique, MD;  Location: [place] CV LAB;  Service: Cardiovascular;  Laterality: N/A;  . COLONOSCOPY WITH PROPOFOL N/A 07/31/2016    Procedure: COLONOSCOPY WITH PROPOFOL;  Surgeon: Lollie Sails, MD;  Location: [facility] ENDOSCOPY;  Service: Endoscopy;  Laterality: N/A;  . ESOPHAGOGASTRODUODENOSCOPY (EGD) WITH PROPOFOL N/A 07/31/2016   Procedure: ESOPHAGOGASTRODUODENOSCOPY (EGD) WITH PROPOFOL;  Surgeon: Lollie Sails, MD;  Location: [facility] ENDOSCOPY;  Service: Endoscopy;  Laterality: N/A;  . LAPAROSCOPIC APPENDECTOMY N/A 08/07/2016   Procedure: APPENDECTOMY LAPAROSCOPIC;  Surgeon: Hubbard Robinson, MD;  Location: [facility] ORS;  Service: General;  Laterality: N/A;  . MOUTH SURGERY      Prior to Admission medications   Medication Sig Start Date End Date Taking? Authorizing Provider  albuterol (PROVENTIL HFA;VENTOLIN HFA) 108 (90 Base) MCG/ACT inhaler Inhale 2 puffs into the lungs every 6 (six) hours as needed for wheezing or shortness of breath. 02/19/17   Volney American, PA-C  amitriptyline (ELAVIL) 10 MG tablet Take 1 tablet (10 mg total) by mouth at bedtime. 10/25/16   Kathrine Haddock, NP  aspirin EC 81 MG tablet Take 81 mg by mouth daily.    [provider]  benzonatate (TESSALON) 100 MG capsule Take 1 capsule (100 mg total) by mouth 2 (two) times daily as needed for cough. 12/20/16   Johnson, Megan P, DO  Dulaglutide (TRULICITY) 4.25 ZD/6.3OV SOPN Inject 0.75 mg into the skin once a week. 02/10/17   Kathrine Haddock, NP  etonogestrel (NEXPLANON) 68 MG IMPL implant 1 each by Subdermal route once.    [provider]  ferrous sulfate 325 (65 FE) MG tablet TAKE ONE TABLET BY MOUTH THREE TIMES DAILY WITH MEALS 03/03/17   Kathrine Haddock, NP  glimepiride (AMARYL) 4 MG tablet Take 1 tablet (4 mg total) by mouth daily. 09/09/16   Kathrine Haddock, NP  glucose blood (ACCU-CHEK ACTIVE STRIPS) test strip Use as instructed 03/25/16   Kathrine Haddock, NP  isosorbide mononitrate (IMDUR) 30 MG 24 hr tablet Take 1 tablet (30 mg total) by mouth daily. 11/08/16 03/17/17  Wellington Hampshire, MD  JANUVIA 100 MG tablet TAKE ONE TABLET BY  MOUTH ONCE DAILY 02/07/17   Park Liter P, DO  loperamide (IMODIUM) 2 MG capsule Take 2 mg by mouth as needed for diarrhea or loose stools.    [provider]  meclizine (ANTIVERT) 32 MG tablet Take 1 tablet (32 mg total) by mouth 3 (three) times daily as needed. Patient taking differently: Take 32 mg by mouth 3 (three) times daily as needed for dizziness or nausea.  06/21/16   Kathrine Haddock, NP  metoCLOPramide (REGLAN) 5 MG tablet Take 1 tablet (5 mg total) by mouth 4 (four) times daily. 01/29/17   Kathrine Haddock, NP  metoprolol (LOPRESSOR) 50 MG tablet Take 1 tablet (50 mg total) by mouth 2 (two) times daily. 03/21/17 06/19/17  Wellington Hampshire, MD  nitroGLYCERIN (NITROSTAT) 0.4 MG SL tablet Place 1 tablet (0.4 mg total)  under the tongue every 5 (five) minutes as needed for chest pain. Reported on 02/20/2016 03/17/17   Alisa Graff, FNP  ondansetron (ZOFRAN ODT) 4 MG disintegrating tablet Take 1 tablet (4 mg total) by mouth every 8 (eight) hours as needed for nausea or vomiting. 12/08/16   Paulette Blanch, MD  pantoprazole (PROTONIX) 40 MG tablet Take 1 tablet (40 mg total) by mouth daily at 6 (six) AM. 09/30/16   Kathrine Haddock, NP  rosuvastatin (CRESTOR) 5 MG tablet Take 1 tablet (5 mg total) by mouth every other day. 12/11/16 03/17/17  Wende Bushy, MD  venlafaxine XR (EFFEXOR-XR) 75 MG 24 hr capsule Take 75 mg by mouth 2 (two) times daily.     [provider]  Vitamin D, Ergocalciferol, (DRISDOL) 50000 units CAPS capsule Take 1 capsule (50,000 Units total) by mouth every 7 (seven) days. 12/27/16   Kathrine Haddock, NP    Allergies Lisinopril; Rosemary oil; Shellfish allergy; and Metformin and related  Family History  Problem Relation Age of Onset  . Diabetes Father   . Cancer Maternal Grandmother        lung  . Cancer Maternal Grandfather        prostate  . Diabetes Paternal Grandfather   . Cancer - Cervical Maternal Aunt     Social History Social History  Substance Use  Topics  . Smoking status: Former Smoker    Years: 15.00    Quit date: 03/10/2015  . Smokeless tobacco: Never Used  . Alcohol use No    Review of Systems Constitutional: No fever/chills Eyes: No visual changes. ENT: No sore throat. No stiff neck no neck pain Cardiovascular: Denies chest pain. Respiratory: Denies shortness of breath. Gastrointestinal:   no vomiting.  No diarrhea.  No constipation. Genitourinary: Positive for mild for dysuria. Musculoskeletal: Negative lower extremity swelling Skin: Negative for rash. Neurological: Negative for severe headaches, focal weakness or numbness. 10-point ROS otherwise negative.  ____________________________________________   PHYSICAL EXAM:  VITAL SIGNS: ED Triage Vitals  Enc Vitals Group     BP 04/23/17 1223 140/87     Pulse Rate 04/23/17 1223 87     Resp 04/23/17 1223 15     Temp 04/23/17 1223 98.7 F (37.1 C)     Temp Source 04/23/17 1223 Oral     SpO2 04/23/17 1223 100 %     Weight 04/23/17 1223 220 lb (99.8 kg)     Height 04/23/17 1223 5\' 5"  (1.651 m)     Head Circumference --      Peak Flow --      Pain Score 04/23/17 1232 10     Pain Loc --      Pain Edu? --      Excl. in [place]? --     Constitutional: Alert and oriented. Well appearing and in no acute distress. Eyes: Conjunctivae are normal. PERRL. EOMI. Head: Atraumatic. Nose: No congestion/rhinnorhea. Mouth/Throat: Mucous membranes are moist.  Oropharynx non-erythematous. Neck: No stridor.   Nontender with no meningismus Cardiovascular: Normal rate, regular rhythm. Grossly normal heart sounds.  Good peripheral circulation. Respiratory: Normal respiratory effort.  No retractions. Lungs CTAB. Abdominal: Soft and nontender. No distention. No guarding no rebound Back:  There is no focal tenderness or step off.  there is no midline tenderness there are no lesions noted. there is no CVA tenderness Musculoskeletal: As palpation to the right knee is not red or hot to touch  there is no evidence of infection, she does have range  of motion although it is tender. Not significant crepitus appreciated. There is obesity which somewhat limits my ability to appreciate effusion. There is no calf tenderness or ankle or hip pain. Compartment are soft with strong distal pulses., no upper extremity tenderness. , no DVT signs strong distal pulses no edema Neurologic:  Normal speech and language. No gross focal neurologic deficits are appreciated.  Skin:  Skin is warm, dry and intact. There is a slight abrasion to the right knee very superficial, already cleaned and dressed. There is another slight abrasion to the left forearm no sign of infection noted. Psychiatric: Mood and affect are normal. Speech and behavior are normal.  ____________________________________________   LABS (all labs ordered are listed, but only abnormal results are displayed)  Labs Reviewed  BASIC METABOLIC PANEL - Abnormal; Notable for the following:       Result Value   Glucose, Bld 187 (*)    BUN 34 (*)    Creatinine, Ser 1.26 (*)    GFR calc non Af Amer 54 (*)    All other components within normal limits  CBC - Abnormal; Notable for the following:    Hemoglobin 10.5 (*)    HCT 31.6 (*)    MCV 79.2 (*)    All other components within normal limits  URINALYSIS, COMPLETE (UACMP) WITH MICROSCOPIC  POC URINE PREG, ED   ____________________________________________  EKG  I personally interpreted any EKGs ordered by me or triage Sinus rhythm rate 87 bpm no acute ST elevation or acute ST depression, no acute ischemic changes. ____________________________________________  [id number]  I reviewed any imaging ordered by me or triage that were performed during my shift and, if possible, patient and/or family made aware of any abnormal findings. ____________________________________________   PROCEDURES  Procedure(s) performed: None  Procedures  Critical Care performed:  None  ____________________________________________   INITIAL IMPRESSION / ASSESSMENT AND PLAN / ED COURSE  Pertinent labs & imaging results that were available during my care of the patient were reviewed by me and considered in my medical decision making (see chart for details).  Patient here for knee pain after a fall. Tetanus is up-to-date. Was a non-syncopal fall she did not pass out. Her chronic dizziness may have contributed to it presents actually like she tripped. In any event, the patient states her fall happened over 24 hours ago. There is no sign of infection or infected joint, which there is no sign of DVT. We'll x-ray the right knee which appears to be the real reason the patient is here. Also she notes in passing that she has had some dysuria and we will check a urinalysis. Otherwise, she is not markedly anemic at this time she is very close to her baseline, her kidney function is close to its baseline, glucose is normal no significant electrolyte abnormality or EKG changes noted, she is neurologically intact and I don't see any indication for further workup of her years long dizziness which is been adequately  and extensively evaluated by outpatient physicians to whom she still goes, including yesterday  ----------------------------------------- 4:06 PM on 04/23/2017 -----------------------------------------  Patient is a urinary tract infection which is likely causing an increase in her chronic right headedness. She is not orthostatic symptoms and she has no complaints at this time. Her pain is better controlled. She is not driving, her sister is driving. She was sent she must not drive and Percocet. We will place her in a knee immobilizer and crutches and have close outpatient follow-up with  orthopedic surgery, return precautions and follow-up given and understood. I don't think further workup for her chronic lightheadedness is dictated given that has been going on for years and she  does not wish any further workup. However I stressed to her the come back if she feels worse and to follow closely with primary care    ____________________________________________   FINAL CLINICAL IMPRESSION(S) / ED DIAGNOSES  Final diagnoses:  None      This chart was dictated using voice recognition software.  Despite best efforts to proofread,  errors can occur which can change meaning.      Schuyler Amor, MD 04/23/17 0802    Schuyler Amor, MD 04/23/17 [phone]

## 2017-04-23 NOTE — Telephone Encounter (Signed)
She can do RICE (rest, ice, compression and elevation). If that's not helping, she will need to be seen.

## 2017-04-23 NOTE — Telephone Encounter (Signed)
Patient had been having dizzy spells and fell in a parking lot hurting her knee which is swollen.  She is wanting to know should she try to be seen here or exactly what she needs to do.  [phone]  Thanks

## 2017-04-23 NOTE — Telephone Encounter (Signed)
Routing to provider for advice.

## 2017-04-23 NOTE — Telephone Encounter (Signed)
Called patient to give her the information per Dr Wynetta Emery.  She will try this and call back if she needs an appt

## 2017-04-23 NOTE — ED Notes (Signed)
Assisted patient to bathroom to attempt urine sample.

## 2017-04-23 NOTE — ED Triage Notes (Signed)
Pt comes into the ED via POV c/o recent falls that included her getting dizzy today and falling onto her right knee.  Patient states she has had 3 falls in 3 days.  Patient has h/o anemia, and dehydration.  Patient in NAD at this time with even and unlabored respirations.  Patient states her dizziness has been nonstop for a couple of months.

## 2017-04-28 ENCOUNTER — Other Ambulatory Visit: Payer: Self-pay | Admitting: Unknown Physician Specialty

## 2017-04-29 ENCOUNTER — Encounter: Payer: Self-pay | Admitting: Unknown Physician Specialty

## 2017-04-29 DIAGNOSIS — W19XXXD Unspecified fall, subsequent encounter: Secondary | ICD-10-CM

## 2017-04-29 LAB — URINE CULTURE: Culture: >=100000 — AB

## 2017-04-29 NOTE — Telephone Encounter (Signed)
Per E.R. Note from 04/23/17,   "Patient here for knee pain after a fall. Tetanus is up-to-date. Was a non-syncopal fall she did not pass out. Her chronic dizziness may have contributed to it presents actually like she tripped. In any event, the patient states her fall happened over 24 hours ago. There is no sign of infection or infected joint, which there is no sign of DVT. We'll x-ray the right knee which appears to be the real reason the patient is here. Also she notes in passing that she has had some dysuria and we will check a urinalysis. Otherwise, she is not markedly anemic at this time she is very close to her baseline, her kidney function is close to its baseline, glucose is normal no significant electrolyte abnormality or EKG changes noted, she is neurologically intact and I don't see any indication for further workup of her years long dizziness which is been adequately  and extensively evaluated by outpatient physicians to whom she still goes, including yesterday"  Ortho referral T'd up if appropriate. Please advise.

## 2017-04-29 NOTE — Telephone Encounter (Signed)
Keri, I know you're not doing referrals today, but can you take care of this one please?

## 2017-04-30 ENCOUNTER — Encounter: Payer: Self-pay | Admitting: Unknown Physician Specialty

## 2017-04-30 ENCOUNTER — Other Ambulatory Visit: Payer: Self-pay | Admitting: Cardiovascular Disease

## 2017-04-30 MED ORDER — NITROFURANTOIN MONOHYD MACRO 100 MG PO CAPS: 100.0000 mg | ORAL_CAPSULE | Freq: Two times a day (BID) | ORAL | 0 refills | Status: DC

## 2017-04-30 NOTE — Progress Notes (Addendum)
ED Antimicrobial Stewardship Positive Culture Follow Up   [name] [name] is an 36 y.o. female who presented to [facility] on 04/23/2017 with a chief complaint of  Chief Complaint  Patient presents with  . Fall  . Dizziness    Recent Results (from the past 720 hour(s))  Urine culture     Status: Abnormal   Collection Time: 04/23/17 12:30 PM  Result Value Ref Range Status   Specimen Description URINE, RANDOM  Final   Special Requests NONE  Final   Culture (A)  Final    >=100,000 COLONIES/mL METHICILLIN RESISTANT STAPHYLOCOCCUS AUREUS   Report Status 04/29/2017 FINAL  Final   Organism ID, Bacteria METHICILLIN RESISTANT STAPHYLOCOCCUS AUREUS (A)  Final      Susceptibility   Methicillin resistant staphylococcus aureus - MIC*    CIPROFLOXACIN <=0.5 SENSITIVE Sensitive     GENTAMICIN <=0.5 SENSITIVE Sensitive     NITROFURANTOIN <=16 SENSITIVE Sensitive     OXACILLIN >=4 RESISTANT Resistant     TETRACYCLINE <=1 SENSITIVE Sensitive     VANCOMYCIN 1 SENSITIVE Sensitive     TRIMETH/SULFA <=10 SENSITIVE Sensitive     CLINDAMYCIN <=0.25 SENSITIVE Sensitive     RIFAMPIN <=0.5 SENSITIVE Sensitive     Inducible Clindamycin NEGATIVE Sensitive     * >=100,000 COLONIES/mL METHICILLIN RESISTANT STAPHYLOCOCCUS AUREUS    [x]  Treated with cephalexin, organism resistant to prescribed antimicrobial  Tried calling patient, however, no answer. Left a VM for patient to call back to main pharmacy.   Will see if patient is experiencing symptoms. If she is not experiencing symptoms then no antibiotic warranted as UA had 6-30 squamous epithelial cells in it and patient originally presented to the ED due to fall. If patient is experiencing UTI symptoms then we will treat with Bactrim DS BID x3 days.   ED Provider: Dr. Percell Belt, PharmD 04/30/2017, 3:40 PM

## 2017-04-30 NOTE — Telephone Encounter (Signed)
Routing to provider  

## 2017-05-01 ENCOUNTER — Telehealth: Payer: Self-pay | Admitting: Emergency Medicine

## 2017-05-01 ENCOUNTER — Other Ambulatory Visit: Payer: Self-pay | Admitting: Unknown Physician Specialty

## 2017-05-01 NOTE — Telephone Encounter (Signed)
Patient called in response to call from pharmacist regarding urine culture.  I gave her recommendations for different antibiotic, as she is still symptomatic for uti.  She says she has been in contact with pcp and they have already called in nitrofurantoin for her. She will take that.

## 2017-05-02 NOTE — Telephone Encounter (Signed)
LV: 02/19/17-Acute w/ [name] [name] Seen [name] [name] 01/17/17. No upcoming appointments.

## 2017-05-15 ENCOUNTER — Other Ambulatory Visit: Payer: Medicaid Other

## 2017-05-15 ENCOUNTER — Ambulatory Visit (INDEPENDENT_AMBULATORY_CARE_PROVIDER_SITE_OTHER): Payer: Medicaid Other | Admitting: Gastroenterology

## 2017-05-15 ENCOUNTER — Encounter: Payer: Self-pay | Admitting: Gastroenterology

## 2017-05-15 VITALS — BP 122/82 | HR 78 | Ht 65.0 in | Wt 228.0 lb

## 2017-05-15 DIAGNOSIS — R110 Nausea: Secondary | ICD-10-CM

## 2017-05-15 DIAGNOSIS — D509 Iron deficiency anemia, unspecified: Secondary | ICD-10-CM

## 2017-05-15 DIAGNOSIS — R6881 Early satiety: Secondary | ICD-10-CM | POA: Diagnosis not present

## 2017-05-15 DIAGNOSIS — R1110 Vomiting, unspecified: Secondary | ICD-10-CM

## 2017-05-15 DIAGNOSIS — K529 Noninfective gastroenteritis and colitis, unspecified: Secondary | ICD-10-CM | POA: Diagnosis not present

## 2017-05-15 DIAGNOSIS — K760 Fatty (change of) liver, not elsewhere classified: Secondary | ICD-10-CM | POA: Diagnosis not present

## 2017-05-15 MED ORDER — AMITRIPTYLINE HCL 10 MG PO TABS: 10.0000 mg | ORAL_TABLET | Freq: Every day | ORAL | 3 refills | Status: DC

## 2017-05-15 MED ORDER — ONDANSETRON 8 MG PO TBDP: 8.0000 mg | ORAL_TABLET | Freq: Three times a day (TID) | ORAL | 1 refills | Status: DC | PRN

## 2017-05-15 MED ORDER — AMITRIPTYLINE HCL 10 MG PO TABS: 10.0000 mg | ORAL_TABLET | Freq: Every day | ORAL | 2 refills | Status: DC

## 2017-05-15 NOTE — Progress Notes (Signed)
HPI :  36 y/o female with a history of CAD s/p MI with 2 coronary stents placed in 2016, ischemic cardiomyopathy, DM, here for a new patient evaluation for chronic diarrhea and nausea  She was previously seen at [facility] for elevated LFTs and these complaints.  She had an extensive lab evaluation for elevated LAEs to include markers for AIH, PBC, iron studies, celiac serologies - these results are not available (ordered in notes that were reviewed). TSH normal She had an US abdomen 06/20/2016 - fatty liver, otherwise normal Colonoscopy 07/31/2016 - diverticulosis sigmoid colon, otherwise normal. Random biopsies taken negative for microscopic colitis EGD 07/31/16 - small amount of residual food in the stomach, normal esophagus and duodenum, CT abdomen / pelvis - 08/05/16 - acute appendicitis, otherwise normal.   It had been recommended that she have a HIDA scan and GES, which she did not have done  She reports nausea and diarrhea for over a year or so at least. She had MI in April 2016, 2 stents in place, on Brilinta initially and now off. She has frequent nausea, daily. She can feel nauseated any time. She has periodic vomiting, depends on what she eats. She endorses early satiety after eating small meals. She does endorse reflux / regurgitation, for which she takes pantoprazole once daily which works to help the heartburn. She is taking Reglan 5mg  TID since February or so, she thinks it helps "a little" but not significantly. She never had a gastric emptying study. She has upper abdominal pain and distension / bloating, worsened / precipitated with eating. She has DM, eating only one meal per day due to symptoms. She states it is pretty well controlled, A1c was at 14s, now to 6s most recently. She has used Zofran in the past which can help at times. She ran out of it.   She also has frequent diarrhea - previously was daily, now every other day. Initially she had some symptoms prior to bedtime, but nor  sporadically. She has had incontinence due to urge to have a bowel movement. She has an urge to have a bowel movement after eating, denies seeing oil / fat in the stools. All of her stools are watery. She has multiple stools per day. She is taking 4 immodium per day, which can help to slow things down. GB remains in place. She has never had pancreatitis. She takes Elavil 10mg  q HS, takes it for neuropathic pain and insomnia. She endorses a lot of gas / bloating which bothers her.  No blood in the stools. She denies frequent NSAID use.   History of fatty liver. She is gaining weight. She does not drink any alcohol. Recent LFTs normal.   She takes iron for iron deficiency anemia - on iron but MCV 78. She reports this was due to menses in the past.   Past Medical History:  Diagnosis Date  . Chronic systolic CHF (congestive heart failure) ([place])    a. echo 03/2015: EF 30-35%, mild concentric LVH, severe HK of inf, inflat, & lat walls, mod MR, mild TR;  b. 04/2015 LV Gram: EF 55-65%.  . Diabetes type 2, controlled ([place])    a. since 2002   . Heavy menses    a. H/O IUD - expired in 2014 - remains in place.  . Hyperlipidemia   . Hypertension   . Iron deficiency anemia   . Ischemic cardiomyopathy    a. 03/2015 EF 30-35% post NSTEMI;  b. 04/2015 EF 55-65%  on LV gram.  . Kidney disease   . MI (myocardial infarction) ([place]) 03/29/2015  . Obesity   . Renal insufficiency   . Tobacco abuse    a. quit 03/2015.  Marland Kitchen Uterine fibroid   . Vertigo   . Vitamin D deficiency      Past Surgical History:  Procedure Laterality Date  . APPENDECTOMY    . CARDIAC CATHETERIZATION  4/16   x2 stent [facility]  . CARDIAC CATHETERIZATION N/A 05/05/2015   Procedure: Left Heart Cath and Coronary Angiography;  Surgeon: Peter M Martinique, MD;  Location: [facility] LAB;  Service: Cardiovascular;  Laterality: N/A;  . COLONOSCOPY WITH PROPOFOL N/A 07/31/2016   Procedure: COLONOSCOPY WITH PROPOFOL;  Surgeon: Lollie Sails, MD;   Location: [facility] ENDOSCOPY;  Service: Endoscopy;  Laterality: N/A;  . ESOPHAGOGASTRODUODENOSCOPY (EGD) WITH PROPOFOL N/A 07/31/2016   Procedure: ESOPHAGOGASTRODUODENOSCOPY (EGD) WITH PROPOFOL;  Surgeon: Lollie Sails, MD;  Location: [facility] ENDOSCOPY;  Service: Endoscopy;  Laterality: N/A;  . LAPAROSCOPIC APPENDECTOMY N/A 08/07/2016   Procedure: APPENDECTOMY LAPAROSCOPIC;  Surgeon: Hubbard Robinson, MD;  Location: [facility] ORS;  Service: General;  Laterality: N/A;  . MOUTH SURGERY     Family History  Problem Relation Age of Onset  . Diabetes Father   . Cancer Maternal Grandmother        lung  . Cancer Maternal Grandfather        prostate  . Diabetes Paternal Grandfather   . Cancer - Cervical Maternal Aunt    Social History  Substance Use Topics  . Smoking status: Former Smoker    Years: 15.00    Quit date: 03/10/2015  . Smokeless tobacco: Never Used  . Alcohol use No   Current Outpatient Prescriptions  Medication Sig Dispense Refill  . albuterol (PROVENTIL HFA;VENTOLIN HFA) 108 (90 Base) MCG/ACT inhaler Inhale 2 puffs into the lungs every 6 (six) hours as needed for wheezing or shortness of breath. 1 Inhaler 2  . amitriptyline (ELAVIL) 10 MG tablet Take 1 tablet (10 mg total) by mouth at bedtime. 60 tablet 2  . aspirin EC 81 MG tablet Take 81 mg by mouth daily.    . benzonatate (TESSALON) 100 MG capsule Take 1 capsule (100 mg total) by mouth 2 (two) times daily as needed for cough. 30 capsule 12  . Dulaglutide (TRULICITY) 1.77 LT/9.0ZE SOPN Inject 0.75 mg into the skin once a week. 4 pen 3  . etonogestrel (NEXPLANON) 68 MG IMPL implant 1 each by Subdermal route once.    . ferrous sulfate 325 (65 FE) MG tablet TAKE ONE TABLET BY MOUTH THREE TIMES DAILY WITH MEALS 90 tablet 3  . glimepiride (AMARYL) 4 MG tablet TAKE ONE TABLET BY MOUTH ONCE DAILY 90 tablet 1  . glucose blood (ACCU-CHEK ACTIVE STRIPS) test strip Use as instructed 100 each 12  . isosorbide mononitrate (IMDUR) 30 MG 24 hr  tablet TAKE ONE TABLET BY MOUTH ONCE DAILY 30 tablet 3  . JANUVIA 100 MG tablet TAKE ONE TABLET BY MOUTH ONCE DAILY 30 tablet 2  . loperamide (IMODIUM) 2 MG capsule Take 2 mg by mouth as needed for diarrhea or loose stools.    . meclizine (ANTIVERT) 32 MG tablet Take 1 tablet (32 mg total) by mouth 3 (three) times daily as needed. (Patient taking differently: Take 32 mg by mouth 3 (three) times daily as needed for dizziness or nausea. ) 30 tablet 0  . metoCLOPramide (REGLAN) 5 MG tablet Take 1 tablet (5 mg total)  by mouth 4 (four) times daily. 120 tablet 12  . metoprolol (LOPRESSOR) 50 MG tablet Take 1 tablet (50 mg total) by mouth 2 (two) times daily. 180 tablet 1  . naproxen (NAPROSYN) 375 MG tablet Take 1 tablet (375 mg total) by mouth 2 (two) times daily with a meal. 16 tablet 0  . nitrofurantoin, macrocrystal-monohydrate, (MACROBID) 100 MG capsule Take 1 capsule (100 mg total) by mouth 2 (two) times daily. 14 capsule 0  . nitroGLYCERIN (NITROSTAT) 0.4 MG SL tablet Place 1 tablet (0.4 mg total) under the tongue every 5 (five) minutes as needed for chest pain. Reported on 02/20/2016 50 tablet 3  . ondansetron (ZOFRAN ODT) 4 MG disintegrating tablet Take 1 tablet (4 mg total) by mouth every 8 (eight) hours as needed for nausea or vomiting. 15 tablet 0  . pantoprazole (PROTONIX) 40 MG tablet TAKE ONE TABLET BY MOUTH ONCE DAILY AT 6 AM. 90 tablet 1  . venlafaxine XR (EFFEXOR-XR) 75 MG 24 hr capsule Take 75 mg by mouth 2 (two) times daily.     . Vitamin D, Ergocalciferol, (DRISDOL) 50000 units CAPS capsule Take 1 capsule (50,000 Units total) by mouth every 7 (seven) days. 12 capsule 0  . rosuvastatin (CRESTOR) 5 MG tablet Take 1 tablet (5 mg total) by mouth every other day. 30 tablet 5   No current facility-administered medications for this visit.    Allergies  Allergen Reactions  . Lisinopril Anaphylaxis, Itching and Swelling  . Rosemary Oil Anaphylaxis  . Shellfish Allergy Itching and Swelling    . Metformin And Related Diarrhea, Nausea And Vomiting and Rash     Review of Systems: All systems reviewed and negative except where noted in HPI.    Dg Knee Complete 4 Views Right  Result Date: 04/23/2017 CLINICAL DATA:  Fall yesterday.  Scraped knee, anterior pain. EXAM: RIGHT KNEE - COMPLETE 4+ VIEW COMPARISON:  Lower leg radiographs from 07/20/2016 FINDINGS: Mild prepatellar soft tissue swelling. Quadriceps spurring noted. No significant knee effusion. No definite fracture. A sunrise patellar view was not performed. IMPRESSION: 1. Prepatellar subcutaneous edema. No appreciable fracture or knee effusion. Electronically Signed   By: Van Clines M.D.   On: 04/23/2017 15:41   Lab Results  Component Value Date   WBC 9.6 04/23/2017   HGB 10.5 (L) 04/23/2017   HCT 31.6 (L) 04/23/2017   MCV 79.2 (L) 04/23/2017   PLT 338 04/23/2017    Lab Results  Component Value Date   CREATININE 1.26 (H) 04/23/2017   BUN 34 (H) 04/23/2017   NA 139 04/23/2017   K 4.2 04/23/2017   CL 109 04/23/2017   CO2 24 04/23/2017    Lab Results  Component Value Date   ALT 22 03/11/2017   AST 21 03/11/2017   GGT 487 (H) 06/03/2016   ALKPHOS 106 03/11/2017   BILITOT 0.3 03/11/2017      Physical Exam: BP 122/82 (BP Location: Left Arm, Patient Position: Sitting, Cuff Size: Large)   Pulse 78   Ht 5\' 5"  (1.651 m)   Wt 228 lb (103.4 kg)   BMI 37.94 kg/m  Constitutional: Pleasant,well-developed, female in no acute distress. HEENT: Normocephalic and atraumatic. Conjunctivae are normal. No scleral icterus. Neck supple.  Cardiovascular: Normal rate, regular rhythm.  Pulmonary/chest: Effort normal and breath sounds normal. No wheezing, rales or rhonchi. Abdominal: Soft, nondistended / protuberant, epigastric TTP. Bowel sounds active throughout. There are no masses palpable. No hepatomegaly. Extremities: no edema Lymphadenopathy: No cervical adenopathy noted. Neurological: Alert  and oriented to  person place and time. Skin: Skin is warm and dry. No rashes noted. Psychiatric: Normal mood and affect. Behavior is normal.   ASSESSMENT AND PLAN: 36 year old female with history of diabetes and coronary artery disease, presenting for further evaluation of the following issues.  Chronic nausea / vomiting / early satiety - workup as outlined above, I suspect she may likely have gastroparesis related to her diabetes. On low dose Reglan, hasn't helped much, so will stop it. Recommend a gastric emptying scan to be done in a few weeks, after she stopped Reglan, to more formally assess for gastroparesis. If she has this, we would discuss higher dose Reglan versus consideration for domperidone. In the interim we'll use 8 mg Zofran every 8 hours for her nausea, this may also help with her diarrhea.  Chronic diarrhea - prior colonoscopy without etiology, we'll obtain lab work from prior celiac testing to ensure negative. Unclear if she has autonomic dysfunction from poorly controlled diabetes, perhaps with bacterial overgrowth, or severe IBS. Will test stool for fecal elastase given her postprandial symptoms, ensure normal/ We'll increase her Elavil from 10 mg daily at bedtime to 20 mg daily at bedtime, consider titrating higher as needed. Was able to obtain a sample of rifaximin for her to take 550 mg 3 times a day for 2 weeks to see if this helps. We'll await her labs and course with this. Of note, she has a history of iron deficiency thought to be related to menorrhagia. Her anemia is improved although remains mildly microcytic. EGD and colonoscopy did not yield a cause. If symptoms persist workup negative may consider capsule endoscopy to clear the small bowel. Prior CT scan did not show any small bowel pathology.  Fatty liver - I suspect this is the cause for her previous elevation in liver enzymes based on report. We'll obtain labs from her prior workup to ensure this was done and no other cause. She is  otherwise working on weight loss.  [address] Cellar, MD [facility] Pager [phone]  CC: Kathrine Haddock, NP

## 2017-05-15 NOTE — Patient Instructions (Addendum)
If you are age 37 or older, your body mass index should be between 23-30. Your Body mass index is 37.94 kg/m. If this is out of the aforementioned range listed, please consider follow up with your Primary Care Provider.  If you are age 46 or younger, your body mass index should be between 19-25. Your Body mass index is 37.94 kg/m. If this is out of the aformentioned range listed, please consider follow up with your Primary Care Provider.   We have sent the following medications to your pharmacy for you to pick up at your convenience:  Zofran  Elavil  Please discontinue Reglan.  You have been giving samples of Xifaxin. Please take one tablet three times daily for 12 days.  Your physician has requested that you go to the basement for the following lab work before leaving today:  Stool study  You have been scheduled for a gastric emptying scan at [facility] on Thursday, June 21st at 7:30am. Please arrive at least 15 minutes prior to your appointment for registration. Please make certain not to have anything to eat or drink after midnight the night before your test. Hold all stomach medications (ex: Zofran, phenergan, Reglan) 48 hours prior to your test. If you need to reschedule your appointment, please contact radiology scheduling at [phone]. _____________________________________________________________________ A gastric-emptying study measures how long it takes for food to move through your stomach. There are several ways to measure stomach emptying. In the most common test, you eat food that contains a small amount of radioactive material. A scanner that detects the movement of the radioactive material is placed over your abdomen to monitor the rate at which food leaves your stomach. This test normally takes about 4 hours to complete. _____________________________________________________________________

## 2017-05-16 ENCOUNTER — Encounter: Payer: Self-pay | Admitting: Family

## 2017-05-16 ENCOUNTER — Ambulatory Visit: Payer: Medicaid Other | Attending: Family | Admitting: Family

## 2017-05-16 VITALS — BP 118/80 | HR 100 | Resp 20 | Ht 64.0 in | Wt 228.0 lb

## 2017-05-16 DIAGNOSIS — Z79899 Other long term (current) drug therapy: Secondary | ICD-10-CM | POA: Insufficient documentation

## 2017-05-16 DIAGNOSIS — I110 Hypertensive heart disease with heart failure: Secondary | ICD-10-CM | POA: Diagnosis present

## 2017-05-16 DIAGNOSIS — Z888 Allergy status to other drugs, medicaments and biological substances status: Secondary | ICD-10-CM | POA: Insufficient documentation

## 2017-05-16 DIAGNOSIS — Z9889 Other specified postprocedural states: Secondary | ICD-10-CM | POA: Diagnosis not present

## 2017-05-16 DIAGNOSIS — Z801 Family history of malignant neoplasm of trachea, bronchus and lung: Secondary | ICD-10-CM | POA: Insufficient documentation

## 2017-05-16 DIAGNOSIS — Z91013 Allergy to seafood: Secondary | ICD-10-CM | POA: Insufficient documentation

## 2017-05-16 DIAGNOSIS — I255 Ischemic cardiomyopathy: Secondary | ICD-10-CM | POA: Insufficient documentation

## 2017-05-16 DIAGNOSIS — N181 Chronic kidney disease, stage 1: Secondary | ICD-10-CM

## 2017-05-16 DIAGNOSIS — Z7984 Long term (current) use of oral hypoglycemic drugs: Secondary | ICD-10-CM | POA: Diagnosis not present

## 2017-05-16 DIAGNOSIS — Z808 Family history of malignant neoplasm of other organs or systems: Secondary | ICD-10-CM | POA: Insufficient documentation

## 2017-05-16 DIAGNOSIS — I5032 Chronic diastolic (congestive) heart failure: Secondary | ICD-10-CM | POA: Diagnosis present

## 2017-05-16 DIAGNOSIS — N289 Disorder of kidney and ureter, unspecified: Secondary | ICD-10-CM | POA: Diagnosis not present

## 2017-05-16 DIAGNOSIS — E669 Obesity, unspecified: Secondary | ICD-10-CM | POA: Diagnosis not present

## 2017-05-16 DIAGNOSIS — I252 Old myocardial infarction: Secondary | ICD-10-CM | POA: Diagnosis not present

## 2017-05-16 DIAGNOSIS — Z7982 Long term (current) use of aspirin: Secondary | ICD-10-CM | POA: Insufficient documentation

## 2017-05-16 DIAGNOSIS — G4733 Obstructive sleep apnea (adult) (pediatric): Secondary | ICD-10-CM | POA: Insufficient documentation

## 2017-05-16 DIAGNOSIS — E559 Vitamin D deficiency, unspecified: Secondary | ICD-10-CM | POA: Diagnosis not present

## 2017-05-16 DIAGNOSIS — Z8042 Family history of malignant neoplasm of prostate: Secondary | ICD-10-CM | POA: Diagnosis not present

## 2017-05-16 DIAGNOSIS — Z833 Family history of diabetes mellitus: Secondary | ICD-10-CM | POA: Diagnosis not present

## 2017-05-16 DIAGNOSIS — E1122 Type 2 diabetes mellitus with diabetic chronic kidney disease: Secondary | ICD-10-CM

## 2017-05-16 DIAGNOSIS — D649 Anemia, unspecified: Secondary | ICD-10-CM | POA: Insufficient documentation

## 2017-05-16 DIAGNOSIS — E119 Type 2 diabetes mellitus without complications: Secondary | ICD-10-CM | POA: Diagnosis not present

## 2017-05-16 DIAGNOSIS — I959 Hypotension, unspecified: Secondary | ICD-10-CM | POA: Insufficient documentation

## 2017-05-16 DIAGNOSIS — E785 Hyperlipidemia, unspecified: Secondary | ICD-10-CM | POA: Insufficient documentation

## 2017-05-16 DIAGNOSIS — Z87891 Personal history of nicotine dependence: Secondary | ICD-10-CM | POA: Diagnosis not present

## 2017-05-16 DIAGNOSIS — I10 Essential (primary) hypertension: Secondary | ICD-10-CM

## 2017-05-16 LAB — GLUCOSE, CAPILLARY: Glucose-Capillary: 235 mg/dL — ABNORMAL HIGH (ref 65–99)

## 2017-05-16 NOTE — Progress Notes (Signed)
Subjective:    Patient ID: [name] [name], female    DOB: May 19, 1981, 36 y.o.   MRN: [id number]  HPI  [name] [name] is a 36 y/o female with a history of diabetes, hyperlipidemia, HTN, anemia, renal insufficiency, MI and heart failure with preserved ejection fraction.   Reviewed echo report from 09/26/16 which showed an EF of 55-60% along with mild MR. PA pressure was in the normal range.   Was in the ED 04/23/17 due to a mechanical fall. Treated and released. Was in the ED 03/11/17 with abdominal pain and diarrhea. Evaluated and discharged home. Was in the ED on 12/08/16 for dizziness and dehydration. Was given IV hydration along with amoxicillen for a dental infection. Previous ED visit was 10/24/16 for chest pain. Troponins were negative and she was discharged home with cardiology follow-up. Was admitted 08/05/16 with acute appendicitis. She had a lap appendectomy done on 08/07/16 and was discharged on 08/08/16. She presented to [facility] in April of 2016 with NSTEMI.   Has had hypotension for which losartan had to be stopped.   She returns today for a follow-up visit with a chief complaint of mild shortness of breath with moderate exertion which improves upon rest. She describes it as intermittent in nature but has been present for several years with varying levels of severity. Does have associated fatigue, chest pain, light-headedness and weight gain along with this.   Past Medical History:  Diagnosis Date  . Chronic systolic CHF (congestive heart failure) ([place])    a. echo 03/2015: EF 30-35%, mild concentric LVH, severe HK of inf, inflat, & lat walls, mod MR, mild TR;  b. 04/2015 LV Gram: EF 55-65%.  . Diabetes type 2, controlled ([place])    a. since 2002   . Heavy menses    a. H/O IUD - expired in 2014 - remains in place.  . Hyperlipidemia   . Hypertension   . Iron deficiency anemia   . Ischemic cardiomyopathy    a. 03/2015 EF 30-35% post NSTEMI;  b. 04/2015 EF 55-65% on LV gram.  . Kidney disease   .  MI (myocardial infarction) ([place]) 03/29/2015  . Obesity   . Renal insufficiency   . Tobacco abuse    a. quit 03/2015.  Marland Kitchen Uterine fibroid   . Vertigo   . Vitamin D deficiency    Past Surgical History:  Procedure Laterality Date  . APPENDECTOMY    . CARDIAC CATHETERIZATION  4/16   x2 stent [facility]  . CARDIAC CATHETERIZATION N/A 05/05/2015   Procedure: Left Heart Cath and Coronary Angiography;  Surgeon: Peter M Martinique, MD;  Location: [place] [facility] LAB;  Service: Cardiovascular;  Laterality: N/A;  . COLONOSCOPY WITH PROPOFOL N/A 07/31/2016   Procedure: COLONOSCOPY WITH PROPOFOL;  Surgeon: Lollie Sails, MD;  Location: [facility] ENDOSCOPY;  Service: Endoscopy;  Laterality: N/A;  . ESOPHAGOGASTRODUODENOSCOPY (EGD) WITH PROPOFOL N/A 07/31/2016   Procedure: ESOPHAGOGASTRODUODENOSCOPY (EGD) WITH PROPOFOL;  Surgeon: Lollie Sails, MD;  Location: [facility] ENDOSCOPY;  Service: Endoscopy;  Laterality: N/A;  . LAPAROSCOPIC APPENDECTOMY N/A 08/07/2016   Procedure: APPENDECTOMY LAPAROSCOPIC;  Surgeon: Hubbard Robinson, MD;  Location: [facility] ORS;  Service: General;  Laterality: N/A;  . MOUTH SURGERY     Family History  Problem Relation Age of Onset  . Diabetes Father   . Cancer Maternal Grandmother        lung  . Cancer Maternal Grandfather        prostate  . Diabetes Paternal Grandfather   .  Cancer - Cervical Maternal Aunt    Social History  Substance Use Topics  . Smoking status: Former Smoker    Years: 15.00    Quit date: 03/10/2015  . Smokeless tobacco: Never Used  . Alcohol use No   Allergies  Allergen Reactions  . Lisinopril Anaphylaxis, Itching and Swelling  . Rosemary Oil Anaphylaxis  . Shellfish Allergy Itching and Swelling  . Metformin And Related Diarrhea, Nausea And Vomiting and Rash   Prior to Admission medications   Medication Sig Start Date End Date Taking? Authorizing Provider  albuterol (PROVENTIL HFA;VENTOLIN HFA) 108 (90 Base) MCG/ACT inhaler Inhale 2 puffs into the lungs  every 6 (six) hours as needed for wheezing or shortness of breath. 02/19/17  Yes Volney American, PA-C  amitriptyline (ELAVIL) 10 MG tablet Take 1-2 tablets (10-20 mg total) by mouth at bedtime. Take 2 tablets at bedtime. Total of 20mg  nightly Patient taking differently: Take 20 mg by mouth at bedtime.  05/15/17  Yes Armbruster, Renelda Loma, MD  aspirin EC 81 MG tablet Take 81 mg by mouth daily.   Yes [provider]  benzonatate (TESSALON) 100 MG capsule Take 1 capsule (100 mg total) by mouth 2 (two) times daily as needed for cough. 12/20/16  Yes Johnson, Megan P, DO  Dulaglutide (TRULICITY) 5.05 LZ/7.6BH SOPN Inject 0.75 mg into the skin once a week. 02/10/17  Yes Kathrine Haddock, NP  etonogestrel (NEXPLANON) 68 MG IMPL implant 1 each by Subdermal route once.   Yes [provider]  ferrous sulfate 325 (65 FE) MG tablet TAKE ONE TABLET BY MOUTH THREE TIMES DAILY WITH MEALS 03/03/17  Yes Kathrine Haddock, NP  glimepiride (AMARYL) 4 MG tablet TAKE ONE TABLET BY MOUTH ONCE DAILY 05/02/17  Yes Wynetta Emery, Megan P, DO  glucose blood (ACCU-CHEK ACTIVE STRIPS) test strip Use as instructed 03/25/16  Yes Kathrine Haddock, NP  isosorbide mononitrate (IMDUR) 30 MG 24 hr tablet TAKE ONE TABLET BY MOUTH ONCE DAILY 04/30/17  Yes Wellington Hampshire, MD  JANUVIA 100 MG tablet TAKE ONE TABLET BY MOUTH ONCE DAILY 02/07/17  Yes Johnson, Megan P, DO  loperamide (IMODIUM) 2 MG capsule Take 2 mg by mouth as needed for diarrhea or loose stools.   Yes [provider]  meclizine (ANTIVERT) 32 MG tablet Take 1 tablet (32 mg total) by mouth 3 (three) times daily as needed. Patient taking differently: Take 32 mg by mouth 3 (three) times daily as needed for dizziness or nausea.  06/21/16  Yes Kathrine Haddock, NP  metoprolol (LOPRESSOR) 50 MG tablet Take 1 tablet (50 mg total) by mouth 2 (two) times daily. 03/21/17 06/19/17 Yes Wellington Hampshire, MD  naproxen (NAPROSYN) 375 MG tablet Take 1 tablet (375 mg total) by  mouth 2 (two) times daily with a meal. 04/23/17 04/23/18 Yes McShane, Gerda Diss, MD  nitrofurantoin, macrocrystal-monohydrate, (MACROBID) 100 MG capsule Take 1 capsule (100 mg total) by mouth 2 (two) times daily. 04/30/17  Yes Johnson, Megan P, DO  nitroGLYCERIN (NITROSTAT) 0.4 MG SL tablet Place 1 tablet (0.4 mg total) under the tongue every 5 (five) minutes as needed for chest pain. Reported on 02/20/2016 03/17/17  Yes [name] A, FNP  ondansetron (ZOFRAN ODT) 8 MG disintegrating tablet Take 1 tablet (8 mg total) by mouth every 8 (eight) hours as needed for nausea or vomiting. 05/15/17  Yes Armbruster, Renelda Loma, MD  pantoprazole (PROTONIX) 40 MG tablet TAKE ONE TABLET BY MOUTH ONCE DAILY AT 6 AM. 04/28/17  Yes  Volney American, PA-C  rifaximin (XIFAXAN) 550 MG TABS tablet Take 550 mg by mouth 3 (three) times daily.   Yes [provider]  rosuvastatin (CRESTOR) 5 MG tablet Take 1 tablet (5 mg total) by mouth every other day. 12/11/16 05/16/17 Yes Wende Bushy, MD  venlafaxine XR (EFFEXOR-XR) 75 MG 24 hr capsule Take 75 mg by mouth 2 (two) times daily.    Yes [provider]  Vitamin D, Ergocalciferol, (DRISDOL) 50000 units CAPS capsule Take 1 capsule (50,000 Units total) by mouth every 7 (seven) days. 12/27/16   Kathrine Haddock, NP     Review of Systems  Constitutional: Positive for appetite change (due to nausea and feeling full quickly) and fatigue. Negative for fever.  HENT: Negative for congestion, postnasal drip and sore throat.   Eyes: Negative.   Respiratory: Positive for shortness of breath (at times). Negative for cough and chest tightness.   Cardiovascular: Positive for chest pain (better). Negative for palpitations and leg swelling.  Gastrointestinal: Positive for nausea. Negative for abdominal distention and abdominal pain.  Endocrine: Negative.   Genitourinary: Negative.   Musculoskeletal: Negative for back pain and neck pain.  Skin: Negative.    Allergic/Immunologic: Negative.   Neurological: Positive for light-headedness. Negative for dizziness and headaches.  Hematological: Negative for adenopathy. Does not bruise/bleed easily.  Psychiatric/Behavioral: Negative for dysphoric mood, sleep disturbance (wearing CPAP nightly) and suicidal ideas. The patient is nervous/anxious (at times).    Vitals:   05/16/17 1005  BP: 118/80  Pulse: 100  Resp: 20  SpO2: 100%  Weight: 228 lb (103.4 kg)  Height: 5\' 4"  (1.626 m)   Wt Readings from Last 3 Encounters:  05/16/17 228 lb (103.4 kg)  05/15/17 228 lb (103.4 kg)  04/23/17 220 lb (99.8 kg)    Lab Results  Component Value Date   CREATININE 1.26 (H) 04/23/2017   CREATININE 1.42 (H) 04/22/2017   CREATININE 1.16 (H) 03/11/2017      Objective:   Physical Exam  Constitutional: She is oriented to person, place, and time. She appears well-developed and well-nourished.  HENT:  Head: Normocephalic and atraumatic.  Neck: Normal range of motion. Neck supple. No JVD present.  Cardiovascular: Normal rate and regular rhythm.   Pulmonary/Chest: Effort normal. She has no wheezes. She has no rales.  Abdominal: Soft. She exhibits no distension. There is no tenderness.  Musculoskeletal: She exhibits no edema or tenderness.  Neurological: She is alert and oriented to person, place, and time.  Skin: Skin is warm and dry.  Psychiatric: She has a normal mood and affect. Her behavior is normal. Thought content normal.  Nursing note and vitals reviewed.     Assessment & Plan:   1: Chronic heart failure with preserved ejection fraction- - NYHA Class II -Euvolemic today -Weight is stable. Reminded to call for an overnight weight gain of >2 pounds or a weekly weight gain of >5 pounds.  - hasn't been doing much walking/exercising due to chronic nausea; hoping to resume walking soon - Continues to not add salt to food; reminded to keep sodium intake to 2000mg  daily - saw her cardiologist Fletcher Anon) on  04/22/17  2: HTN- - Blood pressure looks good today - Saw her PCP Julian Hy) on 01/17/17  3: Diabetes-  - Glucose (nonfasting) in the office today was 235 but patient says that she ate quite a bit of fruit for breakfast this morning - Last A1c on 09/02/16 was 6.9% - suspect gastroparesis; saw GI provider yesterday  4: Obstructive sleep apnea- - wearing CPAP nightly for roughly 7-8 hours each night - reports no further headaches since wearing CPAP - reports waking up feeling rested with less shortness of breath since wearing CPAP  Patient did not bring her medications nor a list. Each medication was verbally reviewed with the patient and she was encouraged to bring the bottles to every visit to confirm accuracy of list.   Return in 3 months or sooner for any questions/problems before then.

## 2017-05-16 NOTE — Patient Instructions (Addendum)
Continue weighing daily and call for an overnight weight gain of > 2 pounds or a weekly weight gain of >5 pounds. 

## 2017-05-19 ENCOUNTER — Other Ambulatory Visit: Payer: Medicaid Other

## 2017-05-19 DIAGNOSIS — K529 Noninfective gastroenteritis and colitis, unspecified: Secondary | ICD-10-CM

## 2017-05-19 DIAGNOSIS — R1110 Vomiting, unspecified: Secondary | ICD-10-CM

## 2017-05-19 DIAGNOSIS — K760 Fatty (change of) liver, not elsewhere classified: Secondary | ICD-10-CM

## 2017-05-19 DIAGNOSIS — R6881 Early satiety: Secondary | ICD-10-CM

## 2017-05-19 DIAGNOSIS — D509 Iron deficiency anemia, unspecified: Secondary | ICD-10-CM

## 2017-05-19 DIAGNOSIS — R110 Nausea: Secondary | ICD-10-CM

## 2017-05-23 LAB — PANCREATIC ELASTASE, FECAL: Pancreatic Elastase-1, Stool: >500 mcg/g

## 2017-05-29 ENCOUNTER — Encounter (HOSPITAL_COMMUNITY)
Admission: RE | Admit: 2017-05-29 | Discharge: 2017-05-29 | Disposition: A | Discharge status: Home / Self Care | Payer: Medicaid Other | Source: Ambulatory Visit | Attending: Gastroenterology | Admitting: Gastroenterology

## 2017-05-29 DIAGNOSIS — R1110 Vomiting, unspecified: Secondary | ICD-10-CM | POA: Insufficient documentation

## 2017-05-29 DIAGNOSIS — R110 Nausea: Secondary | ICD-10-CM | POA: Insufficient documentation

## 2017-05-29 DIAGNOSIS — K529 Noninfective gastroenteritis and colitis, unspecified: Secondary | ICD-10-CM | POA: Insufficient documentation

## 2017-05-29 DIAGNOSIS — R6881 Early satiety: Secondary | ICD-10-CM | POA: Diagnosis present

## 2017-05-29 DIAGNOSIS — K760 Fatty (change of) liver, not elsewhere classified: Secondary | ICD-10-CM | POA: Diagnosis present

## 2017-05-29 DIAGNOSIS — D509 Iron deficiency anemia, unspecified: Secondary | ICD-10-CM | POA: Insufficient documentation

## 2017-05-29 MED ORDER — TECHNETIUM TC 99M SULFUR COLLOID
2.2000 | Freq: Once | INTRAVENOUS | Status: AC | PRN
  Administered 2017-05-29: 2.2 via INTRAVENOUS

## 2017-06-02 ENCOUNTER — Other Ambulatory Visit: Payer: Self-pay

## 2017-06-02 ENCOUNTER — Other Ambulatory Visit: Payer: Self-pay | Admitting: Family Medicine

## 2017-06-02 ENCOUNTER — Other Ambulatory Visit: Payer: Self-pay | Admitting: Cardiovascular Disease

## 2017-06-02 MED ORDER — METOCLOPRAMIDE HCL 10 MG PO TABS: 10.0000 mg | ORAL_TABLET | Freq: Three times a day (TID) | ORAL | 2 refills | Status: DC

## 2017-06-02 NOTE — Telephone Encounter (Signed)
Routing to provider. No follow up on file. 

## 2017-06-02 NOTE — Progress Notes (Signed)
done

## 2017-06-05 ENCOUNTER — Ambulatory Visit: Payer: Medicaid Other | Admitting: Cardiovascular Disease

## 2017-06-16 ENCOUNTER — Encounter: Payer: Self-pay | Admitting: Gastroenterology

## 2017-06-16 DIAGNOSIS — N181 Chronic kidney disease, stage 1: Principal | ICD-10-CM

## 2017-06-16 DIAGNOSIS — E1122 Type 2 diabetes mellitus with diabetic chronic kidney disease: Secondary | ICD-10-CM

## 2017-06-17 ENCOUNTER — Encounter: Payer: Self-pay | Admitting: Gastroenterology

## 2017-06-18 MED ORDER — RIFAXIMIN 550 MG PO TABS: 550.0000 mg | ORAL_TABLET | Freq: Three times a day (TID) | ORAL | 0 refills | Status: DC

## 2017-06-18 NOTE — Telephone Encounter (Signed)
FW: Visit Follow-Up Question  Armbruster, Renelda Loma, MD  Sent: Tue June 17, 2017 10:42 PM  To: Marlon Pel, RN            Message   Yes can we please prescribe her Rifaximin 550mg  TID for 2 weeks (#42, RF0). Hopefully this provides further benefit for her. If not, she can call for reassessment. Thanks

## 2017-06-20 ENCOUNTER — Other Ambulatory Visit: Payer: Self-pay

## 2017-06-20 DIAGNOSIS — E1122 Type 2 diabetes mellitus with diabetic chronic kidney disease: Secondary | ICD-10-CM

## 2017-06-20 DIAGNOSIS — N181 Chronic kidney disease, stage 1: Principal | ICD-10-CM

## 2017-06-20 MED ORDER — RIFAXIMIN 550 MG PO TABS: 550.0000 mg | ORAL_TABLET | Freq: Three times a day (TID) | ORAL | 0 refills | Status: DC

## 2017-06-22 ENCOUNTER — Encounter: Payer: Self-pay | Admitting: Gastroenterology

## 2017-06-23 ENCOUNTER — Telehealth: Payer: Self-pay | Admitting: Gastroenterology

## 2017-06-23 ENCOUNTER — Other Ambulatory Visit: Payer: Self-pay | Admitting: Unknown Physician Specialty

## 2017-06-23 ENCOUNTER — Ambulatory Visit: Payer: Medicaid Other | Admitting: Family

## 2017-06-23 NOTE — Telephone Encounter (Signed)
Office note faxed.

## 2017-06-23 NOTE — Telephone Encounter (Signed)
Pharmacy calling to state they need chart notes for patient prior auth on medication xifaxan to be approved. Please fax to [phone]

## 2017-07-24 ENCOUNTER — Ambulatory Visit: Payer: Medicaid Other | Admitting: Cardiovascular Disease

## 2017-07-25 ENCOUNTER — Encounter: Payer: Self-pay | Admitting: Cardiovascular Disease

## 2017-08-01 ENCOUNTER — Encounter: Payer: Self-pay | Admitting: Gastroenterology

## 2017-08-03 ENCOUNTER — Encounter: Payer: Self-pay | Admitting: Emergency Medicine

## 2017-08-03 ENCOUNTER — Inpatient Hospital Stay
Admission: EM | Admit: 2017-08-03 | Discharge: 2017-08-04 | DRG: 074 | Disposition: A | Discharge status: Home / Self Care | Payer: Medicaid Other | Attending: Specialist | Admitting: Specialist

## 2017-08-03 ENCOUNTER — Emergency Department: Payer: Medicaid Other

## 2017-08-03 DIAGNOSIS — E669 Obesity, unspecified: Secondary | ICD-10-CM | POA: Diagnosis present

## 2017-08-03 DIAGNOSIS — I255 Ischemic cardiomyopathy: Secondary | ICD-10-CM | POA: Diagnosis present

## 2017-08-03 DIAGNOSIS — R1011 Right upper quadrant pain: Secondary | ICD-10-CM | POA: Diagnosis present

## 2017-08-03 DIAGNOSIS — E785 Hyperlipidemia, unspecified: Secondary | ICD-10-CM | POA: Diagnosis present

## 2017-08-03 DIAGNOSIS — R000 Tachycardia, unspecified: Secondary | ICD-10-CM

## 2017-08-03 DIAGNOSIS — K219 Gastro-esophageal reflux disease without esophagitis: Secondary | ICD-10-CM | POA: Diagnosis present

## 2017-08-03 DIAGNOSIS — I252 Old myocardial infarction: Secondary | ICD-10-CM | POA: Diagnosis not present

## 2017-08-03 DIAGNOSIS — E1143 Type 2 diabetes mellitus with diabetic autonomic (poly)neuropathy: Principal | ICD-10-CM | POA: Diagnosis present

## 2017-08-03 DIAGNOSIS — Z7984 Long term (current) use of oral hypoglycemic drugs: Secondary | ICD-10-CM | POA: Diagnosis not present

## 2017-08-03 DIAGNOSIS — I110 Hypertensive heart disease with heart failure: Secondary | ICD-10-CM | POA: Diagnosis present

## 2017-08-03 DIAGNOSIS — G8929 Other chronic pain: Secondary | ICD-10-CM | POA: Diagnosis present

## 2017-08-03 DIAGNOSIS — I2510 Atherosclerotic heart disease of native coronary artery without angina pectoris: Secondary | ICD-10-CM | POA: Diagnosis present

## 2017-08-03 DIAGNOSIS — Z888 Allergy status to other drugs, medicaments and biological substances status: Secondary | ICD-10-CM

## 2017-08-03 DIAGNOSIS — D509 Iron deficiency anemia, unspecified: Secondary | ICD-10-CM | POA: Diagnosis present

## 2017-08-03 DIAGNOSIS — N181 Chronic kidney disease, stage 1: Secondary | ICD-10-CM

## 2017-08-03 DIAGNOSIS — K3184 Gastroparesis: Secondary | ICD-10-CM | POA: Diagnosis present

## 2017-08-03 DIAGNOSIS — E559 Vitamin D deficiency, unspecified: Secondary | ICD-10-CM | POA: Diagnosis present

## 2017-08-03 DIAGNOSIS — Z91013 Allergy to seafood: Secondary | ICD-10-CM

## 2017-08-03 DIAGNOSIS — Z7982 Long term (current) use of aspirin: Secondary | ICD-10-CM | POA: Diagnosis not present

## 2017-08-03 DIAGNOSIS — Z79899 Other long term (current) drug therapy: Secondary | ICD-10-CM | POA: Diagnosis not present

## 2017-08-03 DIAGNOSIS — Z87891 Personal history of nicotine dependence: Secondary | ICD-10-CM

## 2017-08-03 DIAGNOSIS — I5022 Chronic systolic (congestive) heart failure: Secondary | ICD-10-CM | POA: Diagnosis present

## 2017-08-03 DIAGNOSIS — N390 Urinary tract infection, site not specified: Secondary | ICD-10-CM | POA: Diagnosis present

## 2017-08-03 DIAGNOSIS — E1122 Type 2 diabetes mellitus with diabetic chronic kidney disease: Secondary | ICD-10-CM

## 2017-08-03 DIAGNOSIS — Z791 Long term (current) use of non-steroidal anti-inflammatories (NSAID): Secondary | ICD-10-CM

## 2017-08-03 DIAGNOSIS — R109 Unspecified abdominal pain: Secondary | ICD-10-CM

## 2017-08-03 DIAGNOSIS — K580 Irritable bowel syndrome with diarrhea: Secondary | ICD-10-CM | POA: Diagnosis present

## 2017-08-03 LAB — URINALYSIS, COMPLETE (UACMP) WITH MICROSCOPIC
Bilirubin Urine: NEGATIVE
Glucose, UA: >=500 mg/dL — AB
Ketones, ur: NEGATIVE mg/dL
Nitrite: NEGATIVE
Protein, ur: >=300 mg/dL — AB
Specific Gravity, Urine: 1.018 (ref 1.005–1.030)
pH: 5 (ref 5.0–8.0)

## 2017-08-03 LAB — CBC
HCT: 34.6 % — ABNORMAL LOW (ref 35.0–47.0)
Hemoglobin: 11.5 g/dL — ABNORMAL LOW (ref 12.0–16.0)
MCH: 26.7 pg (ref 26.0–34.0)
MCHC: 33.2 g/dL (ref 32.0–36.0)
MCV: 80.4 fL (ref 80.0–100.0)
Platelets: 375 10*3/uL (ref 150–440)
RBC: 4.3 MIL/uL (ref 3.80–5.20)
RDW: 14 % (ref 11.5–14.5)
WBC: 9.7 10*3/uL (ref 3.6–11.0)

## 2017-08-03 LAB — POCT PREGNANCY, URINE: Preg Test, Ur: NEGATIVE

## 2017-08-03 LAB — COMPREHENSIVE METABOLIC PANEL
ALT: 19 U/L (ref 14–54)
AST: 24 U/L (ref 15–41)
Albumin: 3.1 g/dL — ABNORMAL LOW (ref 3.5–5.0)
Alkaline Phosphatase: 124 U/L (ref 38–126)
Anion gap: 12 (ref 5–15)
BUN: 34 mg/dL — ABNORMAL HIGH (ref 6–20)
CO2: 23 mmol/L (ref 22–32)
Calcium: 9.4 mg/dL (ref 8.9–10.3)
Chloride: 99 mmol/L — ABNORMAL LOW (ref 101–111)
Creatinine, Ser: 1.45 mg/dL — ABNORMAL HIGH (ref 0.44–1.00)
GFR calc Af Amer: 53 mL/min — ABNORMAL LOW (ref >60)
GFR calc non Af Amer: 45 mL/min — ABNORMAL LOW (ref >60)
Glucose, Bld: 393 mg/dL — ABNORMAL HIGH (ref 65–99)
Potassium: 3.9 mmol/L (ref 3.5–5.1)
Sodium: 134 mmol/L — ABNORMAL LOW (ref 135–145)
Total Bilirubin: 0.4 mg/dL (ref 0.3–1.2)
Total Protein: 7.4 g/dL (ref 6.5–8.1)

## 2017-08-03 LAB — LACTIC ACID, PLASMA: Lactic Acid, Venous: 1.7 mmol/L (ref 0.5–1.9)

## 2017-08-03 LAB — DIFFERENTIAL
Basophils Absolute: 0.1 10*3/uL (ref 0–0.1)
Basophils Relative: 1 %
Eosinophils Absolute: 0.3 10*3/uL (ref 0–0.7)
Eosinophils Relative: 3 %
Lymphocytes Relative: 19 %
Lymphs Abs: 1.9 10*3/uL (ref 1.0–3.6)
Monocytes Absolute: 0.5 10*3/uL (ref 0.2–0.9)
Monocytes Relative: 5 %
Neutro Abs: 7.2 10*3/uL — ABNORMAL HIGH (ref 1.4–6.5)
Neutrophils Relative %: 72 %

## 2017-08-03 LAB — TROPONIN I
Troponin I: 0.03 ng/mL (ref <0.03)
Troponin I: 0.03 ng/mL (ref <0.03)
Troponin I: 0.04 ng/mL (ref <0.03)

## 2017-08-03 LAB — GLUCOSE, CAPILLARY
Glucose-Capillary: 342 mg/dL — ABNORMAL HIGH (ref 65–99)
Glucose-Capillary: 261 mg/dL — ABNORMAL HIGH (ref 65–99)

## 2017-08-03 LAB — LIPASE, BLOOD: Lipase: 22 U/L (ref 11–51)

## 2017-08-03 LAB — HCG, QUANTITATIVE, PREGNANCY: hCG, Beta Chain, Quant, S: <1 m[IU]/mL (ref <5)

## 2017-08-03 LAB — MRSA PCR SCREENING: MRSA by PCR: POSITIVE — AB

## 2017-08-03 LAB — PROCALCITONIN: Procalcitonin: <0.1 ng/mL

## 2017-08-03 MED ORDER — GLIMEPIRIDE 4 MG PO TABS
4.0000 mg | ORAL_TABLET | Freq: Every day | ORAL | Status: DC
  Administered 2017-08-04: 4 mg via ORAL
  Filled 2017-08-03: qty 1

## 2017-08-03 MED ORDER — IOPAMIDOL (ISOVUE-300) INJECTION 61%
30.0000 mL | Freq: Once | INTRAVENOUS | Status: AC | PRN
  Administered 2017-08-03: 30 mL via ORAL

## 2017-08-03 MED ORDER — ONDANSETRON HCL 4 MG/2ML IJ SOLN
4.0000 mg | Freq: Once | INTRAMUSCULAR | Status: AC
  Administered 2017-08-03: 4 mg via INTRAVENOUS
  Filled 2017-08-03: qty 2

## 2017-08-03 MED ORDER — INSULIN ASPART 100 UNIT/ML ~~LOC~~ SOLN
0.0000 [IU] | Freq: Three times a day (TID) | SUBCUTANEOUS | Status: DC
Start: 2017-08-04 — End: 2017-08-04
  Administered 2017-08-04 (×2): 5 [IU] via SUBCUTANEOUS
  Filled 2017-08-03 (×2): qty 1

## 2017-08-03 MED ORDER — SODIUM CHLORIDE 0.9% FLUSH: 3.0000 mL | INTRAVENOUS | Status: DC | PRN

## 2017-08-03 MED ORDER — ASPIRIN EC 81 MG PO TBEC
81.0000 mg | DELAYED_RELEASE_TABLET | Freq: Every day | ORAL | Status: DC
  Administered 2017-08-03 – 2017-08-04 (×2): 81 mg via ORAL
  Filled 2017-08-03 (×2): qty 1

## 2017-08-03 MED ORDER — SODIUM CHLORIDE 0.9 % IV SOLN
Freq: Once | INTRAVENOUS | Status: AC
  Administered 2017-08-03: 16:00:00 via INTRAVENOUS

## 2017-08-03 MED ORDER — AMITRIPTYLINE HCL 10 MG PO TABS
20.0000 mg | ORAL_TABLET | Freq: Every day | ORAL | Status: DC
  Administered 2017-08-03: 20 mg via ORAL
  Filled 2017-08-03 (×2): qty 2

## 2017-08-03 MED ORDER — SODIUM CHLORIDE 0.9% FLUSH
3.0000 mL | Freq: Two times a day (BID) | INTRAVENOUS | Status: DC
  Administered 2017-08-03 – 2017-08-04 (×2): 3 mL via INTRAVENOUS

## 2017-08-03 MED ORDER — ACETAMINOPHEN 325 MG PO TABS: 650.0000 mg | ORAL_TABLET | Freq: Four times a day (QID) | ORAL | Status: DC | PRN

## 2017-08-03 MED ORDER — MECLIZINE HCL 25 MG PO TABS
32.0000 mg | ORAL_TABLET | Freq: Three times a day (TID) | ORAL | Status: DC | PRN
  Filled 2017-08-03: qty 0.5

## 2017-08-03 MED ORDER — IOPAMIDOL (ISOVUE-370) INJECTION 76%
100.0000 mL | Freq: Once | INTRAVENOUS | Status: AC | PRN
  Administered 2017-08-03: 100 mL via INTRAVENOUS

## 2017-08-03 MED ORDER — MORPHINE SULFATE (PF) 2 MG/ML IV SOLN
2.0000 mg | INTRAVENOUS | Status: DC | PRN
  Administered 2017-08-03: 2 mg via INTRAVENOUS
  Filled 2017-08-03: qty 1

## 2017-08-03 MED ORDER — MORPHINE SULFATE (PF) 4 MG/ML IV SOLN
4.0000 mg | Freq: Once | INTRAVENOUS | Status: AC
  Administered 2017-08-03: 4 mg via INTRAVENOUS
  Filled 2017-08-03: qty 1

## 2017-08-03 MED ORDER — ONDANSETRON 4 MG PO TBDP: 8.0000 mg | ORAL_TABLET | Freq: Three times a day (TID) | ORAL | Status: DC | PRN

## 2017-08-03 MED ORDER — VITAMIN D 1000 UNITS PO TABS
2000.0000 [IU] | ORAL_TABLET | Freq: Every day | ORAL | Status: DC
  Administered 2017-08-03 – 2017-08-04 (×2): 2000 [IU] via ORAL
  Filled 2017-08-03 (×2): qty 2

## 2017-08-03 MED ORDER — ENOXAPARIN SODIUM 40 MG/0.4ML ~~LOC~~ SOLN
40.0000 mg | SUBCUTANEOUS | Status: DC
  Administered 2017-08-03: 40 mg via SUBCUTANEOUS
  Filled 2017-08-03: qty 0.4

## 2017-08-03 MED ORDER — PANTOPRAZOLE SODIUM 40 MG PO TBEC
40.0000 mg | DELAYED_RELEASE_TABLET | Freq: Every day | ORAL | Status: DC
  Administered 2017-08-03: 40 mg via ORAL
  Filled 2017-08-03: qty 1

## 2017-08-03 MED ORDER — ISOSORBIDE MONONITRATE ER 30 MG PO TB24
30.0000 mg | ORAL_TABLET | Freq: Every day | ORAL | Status: DC
  Administered 2017-08-03 – 2017-08-04 (×2): 30 mg via ORAL
  Filled 2017-08-03 (×2): qty 1

## 2017-08-03 MED ORDER — SODIUM CHLORIDE 0.9 % IV SOLN: 250.0000 mL | INTRAVENOUS | Status: DC | PRN

## 2017-08-03 MED ORDER — DEXTROSE 5 % IV SOLN
1.0000 g | Freq: Once | INTRAVENOUS | Status: AC
  Administered 2017-08-03: 1 g via INTRAVENOUS
  Filled 2017-08-03: qty 10

## 2017-08-03 MED ORDER — ONDANSETRON HCL 4 MG PO TABS
4.0000 mg | ORAL_TABLET | Freq: Four times a day (QID) | ORAL | Status: DC | PRN
  Administered 2017-08-04: 4 mg via ORAL
  Filled 2017-08-03: qty 1

## 2017-08-03 MED ORDER — BENZONATATE 100 MG PO CAPS: 100.0000 mg | ORAL_CAPSULE | Freq: Two times a day (BID) | ORAL | Status: DC | PRN

## 2017-08-03 MED ORDER — SODIUM CHLORIDE 0.9 % IV BOLUS (SEPSIS)
1000.0000 mL | Freq: Once | INTRAVENOUS | Status: AC
  Administered 2017-08-03: 1000 mL via INTRAVENOUS

## 2017-08-03 MED ORDER — FERROUS SULFATE 325 (65 FE) MG PO TABS
325.0000 mg | ORAL_TABLET | Freq: Three times a day (TID) | ORAL | Status: DC
  Administered 2017-08-04: 325 mg via ORAL
  Filled 2017-08-03 (×2): qty 1

## 2017-08-03 MED ORDER — HYDROCODONE-ACETAMINOPHEN 5-325 MG PO TABS
1.0000 | ORAL_TABLET | ORAL | Status: DC | PRN
  Administered 2017-08-04: 1 via ORAL
  Filled 2017-08-03: qty 1

## 2017-08-03 MED ORDER — ALUM & MAG HYDROXIDE-SIMETH 200-200-20 MG/5ML PO SUSP
30.0000 mL | Freq: Once | ORAL | Status: AC
  Administered 2017-08-03: 30 mL via ORAL
  Filled 2017-08-03: qty 30

## 2017-08-03 MED ORDER — LOPERAMIDE HCL 2 MG PO CAPS: 2.0000 mg | ORAL_CAPSULE | ORAL | Status: DC | PRN

## 2017-08-03 MED ORDER — ROSUVASTATIN CALCIUM 10 MG PO TABS
5.0000 mg | ORAL_TABLET | Freq: Every day | ORAL | Status: DC
  Administered 2017-08-03 – 2017-08-04 (×2): 5 mg via ORAL
  Filled 2017-08-03 (×2): qty 1

## 2017-08-03 MED ORDER — VENLAFAXINE HCL ER 75 MG PO CP24
150.0000 mg | ORAL_CAPSULE | Freq: Every day | ORAL | Status: DC
  Administered 2017-08-04: 150 mg via ORAL
  Filled 2017-08-03: qty 2

## 2017-08-03 MED ORDER — METOPROLOL TARTRATE 50 MG PO TABS
50.0000 mg | ORAL_TABLET | Freq: Two times a day (BID) | ORAL | Status: DC
  Administered 2017-08-03 – 2017-08-04 (×2): 50 mg via ORAL
  Filled 2017-08-03 (×2): qty 1

## 2017-08-03 MED ORDER — DEXTROSE 5 % IV SOLN
1.0000 g | INTRAVENOUS | Status: DC
  Filled 2017-08-03: qty 10

## 2017-08-03 MED ORDER — LINAGLIPTIN 5 MG PO TABS
5.0000 mg | ORAL_TABLET | Freq: Every day | ORAL | Status: DC
  Administered 2017-08-04: 5 mg via ORAL
  Filled 2017-08-03: qty 1

## 2017-08-03 MED ORDER — ETONOGESTREL 68 MG ~~LOC~~ IMPL: 1.0000 | DRUG_IMPLANT | Freq: Once | SUBCUTANEOUS | Status: DC

## 2017-08-03 MED ORDER — ALBUTEROL SULFATE (2.5 MG/3ML) 0.083% IN NEBU: 3.0000 mL | INHALATION_SOLUTION | Freq: Four times a day (QID) | RESPIRATORY_TRACT | Status: DC | PRN

## 2017-08-03 MED ORDER — ACETAMINOPHEN 650 MG RE SUPP: 650.0000 mg | Freq: Four times a day (QID) | RECTAL | Status: DC | PRN

## 2017-08-03 MED ORDER — RIFAXIMIN 550 MG PO TABS
550.0000 mg | ORAL_TABLET | Freq: Three times a day (TID) | ORAL | Status: DC
  Administered 2017-08-03 – 2017-08-04 (×2): 550 mg via ORAL
  Filled 2017-08-03 (×4): qty 1

## 2017-08-03 MED ORDER — NITROGLYCERIN 0.4 MG SL SUBL: 0.4000 mg | SUBLINGUAL_TABLET | SUBLINGUAL | Status: DC | PRN

## 2017-08-03 MED ORDER — ONDANSETRON HCL 4 MG/2ML IJ SOLN: 4.0000 mg | Freq: Four times a day (QID) | INTRAMUSCULAR | Status: DC | PRN

## 2017-08-03 NOTE — H&P (Signed)
[facility] NAME: [name] [name]    MR#:  [id number]  DATE OF BIRTH:  30-Jun-1981  DATE OF ADMISSION:  08/03/2017  PRIMARY CARE PHYSICIAN: Kathrine Haddock, NP   REQUESTING/REFERRING PHYSICIAN: Nance Pear MD  CHIEF COMPLAINT:   Chief Complaint  Patient presents with  . Abdominal Pain    HISTORY OF PRESENT ILLNESS: [name] [name]  is a 36 y.o. female with a known history of  DM2, Chronic systoilc chf, , Gastroparesis, hyperlipidemia, hypertension, coronary artery disease, irritable bowel syndrome was presenting with complaint of having abdominal pain in the epigastric area since yesterday. Patient states that she had issues with reflux and heartburn for the past few days. Now started having this sharp abdominal pain radiating to her back. He also has had nausea. Denies any hematemesis or hematochezia. She does does have intermittent chest pain. PAST MEDICAL HISTORY:   Past Medical History:  Diagnosis Date  . Chronic systolic CHF (congestive heart failure) ([place])    a. echo 03/2015: EF 30-35%, mild concentric LVH, severe HK of inf, inflat, & lat walls, mod MR, mild TR;  b. 04/2015 LV Gram: EF 55-65%.  . Diabetes type 2, controlled ([place])    a. since 2002   . Heavy menses    a. H/O IUD - expired in 2014 - remains in place.  . Hyperlipidemia   . Hypertension   . Iron deficiency anemia   . Ischemic cardiomyopathy    a. 03/2015 EF 30-35% post NSTEMI;  b. 04/2015 EF 55-65% on LV gram.  . Kidney disease   . MI (myocardial infarction) ([place]) 03/29/2015  . Obesity   . Renal insufficiency   . Tobacco abuse    a. quit 03/2015.  Marland Kitchen Uterine fibroid   . Vertigo   . Vitamin D deficiency     PAST SURGICAL HISTORY: Past Surgical History:  Procedure Laterality Date  . APPENDECTOMY    . CARDIAC CATHETERIZATION  4/16   x2 stent [facility]  . CARDIAC CATHETERIZATION N/A 05/05/2015   Procedure: Left Heart Cath and Coronary Angiography;  Surgeon: Peter M  Martinique, MD;  Location: [facility];  Service: Cardiovascular;  Laterality: N/A;  . COLONOSCOPY WITH PROPOFOL N/A 07/31/2016   Procedure: COLONOSCOPY WITH PROPOFOL;  Surgeon: Lollie Sails, MD;  Location: [facility] ENDOSCOPY;  Service: Endoscopy;  Laterality: N/A;  . ESOPHAGOGASTRODUODENOSCOPY (EGD) WITH PROPOFOL N/A 07/31/2016   Procedure: ESOPHAGOGASTRODUODENOSCOPY (EGD) WITH PROPOFOL;  Surgeon: Lollie Sails, MD;  Location: [facility] ENDOSCOPY;  Service: Endoscopy;  Laterality: N/A;  . LAPAROSCOPIC APPENDECTOMY N/A 08/07/2016   Procedure: APPENDECTOMY LAPAROSCOPIC;  Surgeon: Hubbard Robinson, MD;  Location: [facility] ORS;  Service: General;  Laterality: N/A;  . MOUTH SURGERY      SOCIAL HISTORY:  Social History  Substance Use Topics  . Smoking status: Former Smoker    Years: 15.00    Quit date: 03/10/2015  . Smokeless tobacco: Never Used  . Alcohol use No    FAMILY HISTORY:  Family History  Problem Relation Age of Onset  . Diabetes Father   . Cancer Maternal Grandmother        lung  . Cancer Maternal Grandfather        prostate  . Diabetes Paternal Grandfather   . Cancer - Cervical Maternal Aunt     DRUG ALLERGIES:  Allergies  Allergen Reactions  . Lisinopril Anaphylaxis, Itching and Swelling  . Rosemary Oil Anaphylaxis  . Shellfish Allergy Itching and Swelling  .  Metformin And Related Diarrhea, Nausea And Vomiting and Rash    REVIEW OF SYSTEMS:   CONSTITUTIONAL: No fever, fatigue or weakness.  EYES: No blurred or double vision.  EARS, NOSE, AND THROAT: No tinnitus or ear pain.  RESPIRATORY: No cough, shortness of breath, wheezing or hemoptysis.  CARDIOVASCULAR: Positive chest pain, orthopnea, edema.  GASTROINTESTINAL: Positive nausea, vomiting, diarrhea or Positive abdominal pain.  GENITOURINARY: No dysuria, hematuria.  ENDOCRINE: No polyuria, nocturia,  HEMATOLOGY: No anemia, easy bruising or bleeding SKIN: No rash or lesion. MUSCULOSKELETAL: No joint pain or  arthritis.   NEUROLOGIC: No tingling, numbness, weakness.  PSYCHIATRY: No anxiety or depression.   MEDICATIONS AT HOME:  Prior to Admission medications   Medication Sig Start Date End Date Taking? Authorizing Provider  albuterol (PROVENTIL HFA;VENTOLIN HFA) 108 (90 Base) MCG/ACT inhaler Inhale 2 puffs into the lungs every 6 (six) hours as needed for wheezing or shortness of breath. 02/19/17  Yes Volney American, PA-C  amitriptyline (ELAVIL) 10 MG tablet Take 1-2 tablets (10-20 mg total) by mouth at bedtime. Take 2 tablets at bedtime. Total of 20mg  nightly Patient taking differently: Take 20 mg by mouth at bedtime.  05/15/17  Yes Armbruster, Renelda Loma, MD  aspirin EC 81 MG tablet Take 81 mg by mouth daily.   Yes [provider]  benzonatate (TESSALON) 100 MG capsule Take 1 capsule (100 mg total) by mouth 2 (two) times daily as needed for cough. 12/20/16  Yes Johnson, Megan P, DO  Cholecalciferol 2000 units TABS Take 1 tablet by mouth daily.   Yes [provider]  etonogestrel (NEXPLANON) 68 MG IMPL implant 1 each by Subdermal route once.   Yes [provider]  ferrous sulfate 325 (65 FE) MG tablet TAKE ONE TABLET BY MOUTH THREE TIMES DAILY WITH MEALS Patient taking differently: take one tablet by mouth once daily 03/03/17  Yes Kathrine Haddock, NP  glimepiride (AMARYL) 4 MG tablet TAKE ONE TABLET BY MOUTH ONCE DAILY 05/02/17  Yes Johnson, Megan P, DO  isosorbide mononitrate (IMDUR) 30 MG 24 hr tablet TAKE ONE TABLET BY MOUTH ONCE DAILY 04/30/17  Yes Wellington Hampshire, MD  JANUVIA 100 MG tablet TAKE 1 TABLET BY MOUTH ONCE DAILY 06/02/17  Yes Kathrine Haddock, NP  loperamide (IMODIUM) 2 MG capsule Take 2 mg by mouth as needed for diarrhea or loose stools.   Yes [provider]  meclizine (ANTIVERT) 32 MG tablet Take 1 tablet (32 mg total) by mouth 3 (three) times daily as needed. Patient taking differently: Take 32 mg by mouth 3 (three) times daily as needed for  dizziness or nausea.  06/21/16  Yes Kathrine Haddock, NP  metoCLOPramide (REGLAN) 10 MG tablet Take 1 tablet (10 mg total) by mouth 3 (three) times daily before meals. 06/02/17  Yes Armbruster, Renelda Loma, MD  metoprolol (LOPRESSOR) 50 MG tablet Take 1 tablet (50 mg total) by mouth 2 (two) times daily. 03/21/17 08/03/17 Yes Wellington Hampshire, MD  nitroGLYCERIN (NITROSTAT) 0.4 MG SL tablet Place 1 tablet (0.4 mg total) under the tongue every 5 (five) minutes as needed for chest pain. Reported on 02/20/2016 03/17/17  Yes [name] A, FNP  ondansetron (ZOFRAN ODT) 8 MG disintegrating tablet Take 1 tablet (8 mg total) by mouth every 8 (eight) hours as needed for nausea or vomiting. 05/15/17  Yes Armbruster, Renelda Loma, MD  pantoprazole (PROTONIX) 40 MG tablet TAKE ONE TABLET BY MOUTH ONCE DAILY AT 6 AM. 04/28/17  Yes Volney American, PA-C  rifaximin (XIFAXAN) 550 MG TABS tablet Take 1 tablet (550 mg total) by mouth 3 (three) times daily. 06/20/17  Yes Armbruster, Renelda Loma, MD  rosuvastatin (CRESTOR) 5 MG tablet TAKE ONE TABLET BY MOUTH ONCE DAILY 06/02/17  Yes Wellington Hampshire, MD  TRULICITY 1.51 VO/1.6WV SOPN INJECT ONE SYRINGEFUL INTO THE SKIN ONCE WEEKLY 06/23/17  Yes Kathrine Haddock, NP  venlafaxine XR (EFFEXOR-XR) 150 MG 24 hr capsule Take 150 mg by mouth daily with breakfast.    Yes [provider]  glucose blood (ACCU-CHEK ACTIVE STRIPS) test strip Use as instructed 03/25/16   Kathrine Haddock, NP  naproxen (NAPROSYN) 375 MG tablet Take 1 tablet (375 mg total) by mouth 2 (two) times daily with a meal. 04/23/17 04/23/18  Schuyler Amor, MD  nitrofurantoin, macrocrystal-monohydrate, (MACROBID) 100 MG capsule Take 1 capsule (100 mg total) by mouth 2 (two) times daily. Patient not taking: Reported on 08/03/2017 04/30/17   Park Liter P, DO  rosuvastatin (CRESTOR) 5 MG tablet Take 1 tablet (5 mg total) by mouth every other day. 12/11/16 05/16/17  Wende Bushy, MD      PHYSICAL EXAMINATION:    VITAL SIGNS: Blood pressure (!) 160/98, pulse (!) 124, temperature 97.8 F (36.6 C), temperature source Oral, resp. rate 16, height 5\' 4"  (1.626 m), weight 230 lb (104.3 kg), SpO2 100 %.  GENERAL:  36 y.o.-year-old patient lying in the bed with no acute distress.  EYES: Pupils equal, round, reactive to light and accommodation. No scleral icterus. Extraocular muscles intact.  HEENT: Head atraumatic, normocephalic. Oropharynx and nasopharynx clear.  NECK:  Supple, no jugular venous distention. No thyroid enlargement, no tenderness.  LUNGS: Normal breath sounds bilaterally, no wheezing, rales,rhonchi or crepitation. No use of accessory muscles of respiration.  CARDIOVASCULAR: S1, S2 normal. No murmurs, rubs, or gallops.  ABDOMEN: Soft, Epigastric tenderness, nondistended. Bowel sounds present. No organomegaly or mass.  EXTREMITIES: No pedal edema, cyanosis, or clubbing.  NEUROLOGIC: Cranial nerves II through XII are intact. Muscle strength 5/5 in all extremities. Sensation intact. Gait not checked.  PSYCHIATRIC: The patient is alert and oriented x 3.  SKIN: No obvious rash, lesion, or ulcer.   LABORATORY PANEL:   CBC  Recent Labs Lab 08/03/17 1219  WBC 9.7  HGB 11.5*  HCT 34.6*  PLT 375  MCV 80.4  MCH 26.7  MCHC 33.2  RDW 14.0  LYMPHSABS 1.9  MONOABS 0.5  EOSABS 0.3  BASOSABS 0.1   ------------------------------------------------------------------------------------------------------------------  Chemistries   Recent Labs Lab 08/03/17 1219  NA 134*  K 3.9  CL 99*  CO2 23  GLUCOSE 393*  BUN 34*  CREATININE 1.45*  CALCIUM 9.4  AST 24  ALT 19  ALKPHOS 124  BILITOT 0.4   ------------------------------------------------------------------------------------------------------------------ estimated creatinine clearance is 62.5 mL/min (A) (by C-G formula based on SCr of 1.45 mg/dL  (H)). ------------------------------------------------------------------------------------------------------------------ No results for input(s): TSH, T4TOTAL, T3FREE, THYROIDAB in the last 72 hours.  Invalid input(s): FREET3   Coagulation profile No results for input(s): INR, PROTIME in the last 168 hours. ------------------------------------------------------------------------------------------------------------------- No results for input(s): DDIMER in the last 72 hours. -------------------------------------------------------------------------------------------------------------------  Cardiac Enzymes  Recent Labs Lab 08/03/17 1219 08/03/17 1438  TROPONINI 0.03* 0.03*   ------------------------------------------------------------------------------------------------------------------ Invalid input(s): POCBNP  ---------------------------------------------------------------------------------------------------------------  Urinalysis    Component Value Date/Time   COLORURINE YELLOW (A) 08/03/2017 1438   APPEARANCEUR CLOUDY (A) 08/03/2017 1438   APPEARANCEUR Clear 09/20/2016 0905   LABSPEC 1.018 08/03/2017 1438   LABSPEC 1.023 03/29/2015 2345   PHURINE 5.0  08/03/2017 1438   GLUCOSEU >=500 (A) 08/03/2017 1438   GLUCOSEU >=500 03/29/2015 2345   HGBUR MODERATE (A) 08/03/2017 1438   BILIRUBINUR NEGATIVE 08/03/2017 1438   BILIRUBINUR Negative 09/20/2016 0905   BILIRUBINUR Negative 03/29/2015 2345   KETONESUR NEGATIVE 08/03/2017 1438   PROTEINUR >=300 (A) 08/03/2017 1438   NITRITE NEGATIVE 08/03/2017 1438   LEUKOCYTESUR MODERATE (A) 08/03/2017 1438   LEUKOCYTESUR Trace (A) 09/20/2016 0905   LEUKOCYTESUR 3+ 03/29/2015 2345     RADIOLOGY: Ct Angio Chest Pe W And/or Wo Contrast  Result Date: 08/03/2017 CLINICAL DATA:  Chest pain EXAM: CT ANGIOGRAPHY CHEST CT ABDOMEN AND PELVIS WITH CONTRAST TECHNIQUE: Multidetector CT imaging of the chest was performed using the standard  protocol during bolus administration of intravenous contrast. Multiplanar CT image reconstructions and MIPs were obtained to evaluate the vascular anatomy. Multidetector CT imaging of the abdomen and pelvis was performed using the standard protocol during bolus administration of intravenous contrast. CONTRAST:  100 mL Isovue 370. COMPARISON:  None. FINDINGS: CTA CHEST FINDINGS Cardiovascular: Thoracic aorta is within normal limits. No atherosclerotic changes or aneurysmal dilatation is seen. The pulmonary artery shows a normal branching pattern. No definitive pulmonary embolism is seen. No right heart strain is noted. No cardiac enlargement is seen. Very mild coronary calcifications are seen. Mediastinum/Nodes: The esophagus is air filled but otherwise within normal limits. The thoracic inlet is unremarkable. No mediastinal or hilar adenopathy is noted. Lungs/Pleura: Lungs are well aerated bilaterally. A small 3 mm nodule is noted in the lingula along the fissure best seen on image number 38 of series 8. No other nodular changes are seen. Musculoskeletal: No chest wall abnormality. No acute or significant osseous findings. Review of the MIP images confirms the above findings. CT ABDOMEN and PELVIS FINDINGS Hepatobiliary: Liver is diffusely fatty infiltrated. The gallbladder is within normal limits. Pancreas: Unremarkable. No pancreatic ductal dilatation or surrounding inflammatory changes. Spleen: Normal in size without focal abnormality. Adrenals/Urinary Tract: Adrenal glands are unremarkable. Kidneys are normal, without renal calculi, focal lesion, or hydronephrosis. Bladder is decompressed. Stomach/Bowel: No obstructive or inflammatory changes are seen. Ingested material is noted within the stomach. The appendix has been surgically removed. Vascular/Lymphatic: Aortic atherosclerosis. No enlarged abdominal or pelvic lymph nodes. Reproductive: Uterus and bilateral adnexa are unremarkable. Other: No abdominal wall  hernia or abnormality. No abdominopelvic ascites. Musculoskeletal: No acute or significant osseous findings. Review of the MIP images confirms the above findings. IMPRESSION: No evidence of pulmonary emboli. 3 mm nodule in the lingula on the left. No follow-up needed if patient is low-risk. Non-contrast chest CT can be considered in 12 months if patient is high-risk. This recommendation follows the consensus statement: Guidelines for Management of Incidental Pulmonary Nodules Detected on CT Images: From the Fleischner Society 2017; Radiology 2017; 284:228-243. No acute abnormality is noted in the abdomen/pelvis. Electronically Signed   By: Inez Catalina M.D.   On: 08/03/2017 16:16   Ct Abdomen Pelvis W Contrast  Result Date: 08/03/2017 CLINICAL DATA:  Chest pain EXAM: CT ANGIOGRAPHY CHEST CT ABDOMEN AND PELVIS WITH CONTRAST TECHNIQUE: Multidetector CT imaging of the chest was performed using the standard protocol during bolus administration of intravenous contrast. Multiplanar CT image reconstructions and MIPs were obtained to evaluate the vascular anatomy. Multidetector CT imaging of the abdomen and pelvis was performed using the standard protocol during bolus administration of intravenous contrast. CONTRAST:  100 mL Isovue 370. COMPARISON:  None. FINDINGS: CTA CHEST FINDINGS Cardiovascular: Thoracic aorta is within normal limits. No atherosclerotic  changes or aneurysmal dilatation is seen. The pulmonary artery shows a normal branching pattern. No definitive pulmonary embolism is seen. No right heart strain is noted. No cardiac enlargement is seen. Very mild coronary calcifications are seen. Mediastinum/Nodes: The esophagus is air filled but otherwise within normal limits. The thoracic inlet is unremarkable. No mediastinal or hilar adenopathy is noted. Lungs/Pleura: Lungs are well aerated bilaterally. A small 3 mm nodule is noted in the lingula along the fissure best seen on image number 38 of series 8. No other  nodular changes are seen. Musculoskeletal: No chest wall abnormality. No acute or significant osseous findings. Review of the MIP images confirms the above findings. CT ABDOMEN and PELVIS FINDINGS Hepatobiliary: Liver is diffusely fatty infiltrated. The gallbladder is within normal limits. Pancreas: Unremarkable. No pancreatic ductal dilatation or surrounding inflammatory changes. Spleen: Normal in size without focal abnormality. Adrenals/Urinary Tract: Adrenal glands are unremarkable. Kidneys are normal, without renal calculi, focal lesion, or hydronephrosis. Bladder is decompressed. Stomach/Bowel: No obstructive or inflammatory changes are seen. Ingested material is noted within the stomach. The appendix has been surgically removed. Vascular/Lymphatic: Aortic atherosclerosis. No enlarged abdominal or pelvic lymph nodes. Reproductive: Uterus and bilateral adnexa are unremarkable. Other: No abdominal wall hernia or abnormality. No abdominopelvic ascites. Musculoskeletal: No acute or significant osseous findings. Review of the MIP images confirms the above findings. IMPRESSION: No evidence of pulmonary emboli. 3 mm nodule in the lingula on the left. No follow-up needed if patient is low-risk. Non-contrast chest CT can be considered in 12 months if patient is high-risk. This recommendation follows the consensus statement: Guidelines for Management of Incidental Pulmonary Nodules Detected on CT Images: From the Fleischner Society 2017; Radiology 2017; 284:228-243. No acute abnormality is noted in the abdomen/pelvis. Electronically Signed   By: Inez Catalina M.D.   On: 08/03/2017 16:16   Dg Chest Portable 1 View  Result Date: 08/03/2017 CLINICAL DATA:  Nausea and vomiting with right-sided abdominal pain EXAM: PORTABLE CHEST 1 VIEW COMPARISON:  03/11/2017 FINDINGS: The heart size and mediastinal contours are within normal limits. Both lungs are clear. The visualized skeletal structures are unremarkable. IMPRESSION: No  active disease. Electronically Signed   By: Inez Catalina M.D.   On: 08/03/2017 14:04   US Abdomen Limited Ruq  Result Date: 08/03/2017 CLINICAL DATA:  Right upper quadrant pain EXAM: ULTRASOUND ABDOMEN LIMITED RIGHT UPPER QUADRANT COMPARISON:  08/05/2016 FINDINGS: Gallbladder: No gallstones or wall thickening visualized. No sonographic Murphy sign noted by sonographer. Common bile duct: Diameter: 3.2 mm Liver: Mildly heterogeneous and increased in echogenicity consistent with fatty infiltration. Portal vein is patent on color Doppler imaging with normal direction of blood flow towards the liver. IMPRESSION: Mild fatty infiltration of the liver. Electronically Signed   By: Inez Catalina M.D.   On: 08/03/2017 14:09    EKG: Orders placed or performed during the hospital encounter of 08/03/17  . ED EKG  . ED EKG  . EKG 12-Lead  . EKG 12-Lead  . Repeat EKG  . Repeat EKG    IMPRESSION AND PLAN: Patient is a 36 year old African-American female presenting with abdominal pain  1. Urinary tract infection we'll treat with ceftriaxone and follow urine cultures  2. Abdominal pain etiology unclear CT of the abdomen negative She does take NSAIDs on daily basis. I will ask GI to see her I will place her on IV Reglan instead of oral Reglan  3. Diabetes type 2 we'll continue her home regimen as well as placed on sliding scale  insulin  4. Coronary artery disease with intermittent chest pain and elevated heart rate I will continue her metoprolol ask her cardiologist to see  5. Irritable bowel syndrome continue her home regimen  6. Miscellaneous Lovenox for DVT prophylaxis  All the records are reviewed and case discussed with ED provider. Management plans discussed with the patient, family and they are in agreement.  CODE STATUS: Code Status History    Date Active Date Inactive Code Status Order ID Comments User Context   08/05/2016  8:32 AM 08/08/2016  8:08 PM Full Code [id number]  Clayburn Pert,  MD Inpatient   03/01/2016  6:12 AM 03/01/2016  7:54 PM Full Code [id number]  Harrie Foreman, MD Inpatient   08/21/2015  5:36 PM 08/22/2015 10:03 PM Full Code [id number]  Dustin Flock, MD ED   07/22/2015 11:05 AM 07/23/2015  6:32 PM Full Code [id number]  Epifanio Lesches, MD Inpatient   05/05/2015 10:59 AM 05/05/2015  8:55 PM Full Code [id number]  Martinique, Peter M, MD Inpatient   05/05/2015  6:13 AM 05/05/2015 10:59 AM Full Code [id number]  Rise Patience, MD Inpatient       TOTAL TIME TAKING CARE OF THIS PATIENT: 55 minutes.    Dustin Flock M.D on 08/03/2017 at 7:48 PM  Between 7am to 6pm - Pager - [phone]  After 6pm go to [email or web address] - password [email or web address]  Office  [phone]  CC: Primary care physician; Kathrine Haddock, NP

## 2017-08-03 NOTE — ED Provider Notes (Signed)
[facility] Emergency Department Provider Note   ____________________________________________   First MD Initiated Contact with Patient 08/03/17 1304     (approximate)  I have reviewed the triage vital signs and the nursing notes.   HISTORY  Chief Complaint Abdominal Pain    HPI [name] [name] is a 36 y.o. female who reports heartburn for a week. She says the pain is got worse last night after eating some chicken and radiates around to her back she has nausea and vomiting with especially when she tries to eat there is no diarrhea no fever pain is made worse with deep breathing is pretty severe right now   Past Medical History:  Diagnosis Date  . Chronic systolic CHF (congestive heart failure) ([place])    a. echo 03/2015: EF 30-35%, mild concentric LVH, severe HK of inf, inflat, & lat walls, mod MR, mild TR;  b. 04/2015 LV Gram: EF 55-65%.  . Diabetes type 2, controlled ([place])    a. since 2002   . Heavy menses    a. H/O IUD - expired in 2014 - remains in place.  . Hyperlipidemia   . Hypertension   . Iron deficiency anemia   . Ischemic cardiomyopathy    a. 03/2015 EF 30-35% post NSTEMI;  b. 04/2015 EF 55-65% on LV gram.  . Kidney disease   . MI (myocardial infarction) ([place]) 03/29/2015  . Obesity   . Renal insufficiency   . Tobacco abuse    a. quit 03/2015.  Marland Kitchen Uterine fibroid   . Vertigo   . Vitamin D deficiency     Patient Active Problem List   Diagnosis Date Noted  . Obstructive sleep apnea 05/16/2017  . Neuropathy 01/17/2017  . Edema of both ankles 01/17/2017  . Fatigue 12/16/2016  . Myalgic 10/25/2016  . Vitamin D deficiency 10/25/2016  . Insomnia 10/25/2016  . Urinary tract infection 09/20/2016  . HTN (hypertension) 09/05/2016  . Vertigo 06/21/2016  . Elevated liver function tests 06/05/2016  . Chronic diastolic heart failure ([place]) 03/19/2016  . Tachycardia 03/19/2016  . IBS (irritable bowel syndrome) 12/15/2015  . Proteinuria  08/30/2015  . Coronary artery disease involving native coronary artery of native heart with angina pectoris with documented spasm ([place])   . Angina pectoris ([place])   . Iron deficiency anemia   . Heavy menses   . SOB (shortness of breath) 08/21/2015  . Hyperlipidemia 04/13/2015  . Ischemic cardiomyopathy   . Diabetes type 2, controlled ([place])   . Obesity     Past Surgical History:  Procedure Laterality Date  . APPENDECTOMY    . CARDIAC CATHETERIZATION  4/16   x2 stent [facility]  . CARDIAC CATHETERIZATION N/A 05/05/2015   Procedure: Left Heart Cath and Coronary Angiography;  Surgeon: Peter M Martinique, MD;  Location: [place] CV LAB;  Service: Cardiovascular;  Laterality: N/A;  . COLONOSCOPY WITH PROPOFOL N/A 07/31/2016   Procedure: COLONOSCOPY WITH PROPOFOL;  Surgeon: Lollie Sails, MD;  Location: [facility] ENDOSCOPY;  Service: Endoscopy;  Laterality: N/A;  . ESOPHAGOGASTRODUODENOSCOPY (EGD) WITH PROPOFOL N/A 07/31/2016   Procedure: ESOPHAGOGASTRODUODENOSCOPY (EGD) WITH PROPOFOL;  Surgeon: Lollie Sails, MD;  Location: [facility] ENDOSCOPY;  Service: Endoscopy;  Laterality: N/A;  . LAPAROSCOPIC APPENDECTOMY N/A 08/07/2016   Procedure: APPENDECTOMY LAPAROSCOPIC;  Surgeon: Hubbard Robinson, MD;  Location: [facility] ORS;  Service: General;  Laterality: N/A;  . MOUTH SURGERY      Prior to Admission medications   Medication Sig Start Date End Date Taking? Authorizing  Provider  albuterol (PROVENTIL HFA;VENTOLIN HFA) 108 (90 Base) MCG/ACT inhaler Inhale 2 puffs into the lungs every 6 (six) hours as needed for wheezing or shortness of breath. 02/19/17   Volney American, PA-C  amitriptyline (ELAVIL) 10 MG tablet Take 1-2 tablets (10-20 mg total) by mouth at bedtime. Take 2 tablets at bedtime. Total of 20mg  nightly Patient taking differently: Take 20 mg by mouth at bedtime.  05/15/17   Armbruster, Renelda Loma, MD  aspirin EC 81 MG tablet Take 81 mg by mouth daily.    [provider]  benzonatate  (TESSALON) 100 MG capsule Take 1 capsule (100 mg total) by mouth 2 (two) times daily as needed for cough. 12/20/16   Johnson, Megan P, DO  etonogestrel (NEXPLANON) 68 MG IMPL implant 1 each by Subdermal route once.    [provider]  ferrous sulfate 325 (65 FE) MG tablet TAKE ONE TABLET BY MOUTH THREE TIMES DAILY WITH MEALS 03/03/17   Kathrine Haddock, NP  glimepiride (AMARYL) 4 MG tablet TAKE ONE TABLET BY MOUTH ONCE DAILY 05/02/17   Park Liter P, DO  glucose blood (ACCU-CHEK ACTIVE STRIPS) test strip Use as instructed 03/25/16   Kathrine Haddock, NP  isosorbide mononitrate (IMDUR) 30 MG 24 hr tablet TAKE ONE TABLET BY MOUTH ONCE DAILY 04/30/17   Wellington Hampshire, MD  JANUVIA 100 MG tablet TAKE 1 TABLET BY MOUTH ONCE DAILY 06/02/17   Kathrine Haddock, NP  loperamide (IMODIUM) 2 MG capsule Take 2 mg by mouth as needed for diarrhea or loose stools.    [provider]  meclizine (ANTIVERT) 32 MG tablet Take 1 tablet (32 mg total) by mouth 3 (three) times daily as needed. Patient taking differently: Take 32 mg by mouth 3 (three) times daily as needed for dizziness or nausea.  06/21/16   Kathrine Haddock, NP  metoCLOPramide (REGLAN) 10 MG tablet Take 1 tablet (10 mg total) by mouth 3 (three) times daily before meals. 06/02/17   Armbruster, Renelda Loma, MD  metoprolol (LOPRESSOR) 50 MG tablet Take 1 tablet (50 mg total) by mouth 2 (two) times daily. 03/21/17 06/19/17  Wellington Hampshire, MD  naproxen (NAPROSYN) 375 MG tablet Take 1 tablet (375 mg total) by mouth 2 (two) times daily with a meal. 04/23/17 04/23/18  Schuyler Amor, MD  nitrofurantoin, macrocrystal-monohydrate, (MACROBID) 100 MG capsule Take 1 capsule (100 mg total) by mouth 2 (two) times daily. 04/30/17   Johnson, Megan P, DO  nitroGLYCERIN (NITROSTAT) 0.4 MG SL tablet Place 1 tablet (0.4 mg total) under the tongue every 5 (five) minutes as needed for chest pain. Reported on 02/20/2016 03/17/17   Alisa Graff, FNP  ondansetron (ZOFRAN  ODT) 8 MG disintegrating tablet Take 1 tablet (8 mg total) by mouth every 8 (eight) hours as needed for nausea or vomiting. 05/15/17   Armbruster, Renelda Loma, MD  pantoprazole (PROTONIX) 40 MG tablet TAKE ONE TABLET BY MOUTH ONCE DAILY AT 6 AM. 04/28/17   Volney American, PA-C  rifaximin (XIFAXAN) 550 MG TABS tablet Take 1 tablet (550 mg total) by mouth 3 (three) times daily. 06/20/17   Armbruster, Renelda Loma, MD  rosuvastatin (CRESTOR) 5 MG tablet Take 1 tablet (5 mg total) by mouth every other day. 12/11/16 05/16/17  Wende Bushy, MD  rosuvastatin (CRESTOR) 5 MG tablet TAKE ONE TABLET BY MOUTH ONCE DAILY 06/02/17   Wellington Hampshire, MD  TRULICITY 8.50 YD/7.4JO SOPN INJECT ONE [facility] INTO THE SKIN ONCE WEEKLY 06/23/17  Kathrine Haddock, NP  venlafaxine XR (EFFEXOR-XR) 75 MG 24 hr capsule Take 75 mg by mouth 2 (two) times daily.     [provider]  Vitamin D, Ergocalciferol, (DRISDOL) 50000 units CAPS capsule Take 1 capsule (50,000 Units total) by mouth every 7 (seven) days. 12/27/16   Kathrine Haddock, NP    Allergies Lisinopril; Rosemary oil; Shellfish allergy; and Metformin and related  Family History  Problem Relation Age of Onset  . Diabetes Father   . Cancer Maternal Grandmother        lung  . Cancer Maternal Grandfather        prostate  . Diabetes Paternal Grandfather   . Cancer - Cervical Maternal Aunt     Social History Social History  Substance Use Topics  . Smoking status: Former Smoker    Years: 15.00    Quit date: 03/10/2015  . Smokeless tobacco: Never Used  . Alcohol use No    Review of Systems  Constitutional: No fever/chills Eyes: No visual changes. ENT: No sore throat. Cardiovascular: Denies chest pain. Respiratory: Denies shortness of breath. Gastrointestinal: See history of present illness Genitourinary: Negative for dysuria. Musculoskeletal: Negative for back pain. Skin: Negative for rash. Neurological: Negative for headaches, focal weakness     ____________________________________________   PHYSICAL EXAM:  VITAL SIGNS: ED Triage Vitals  Enc Vitals Group     BP 08/03/17 1217 (!) 144/85     Pulse Rate 08/03/17 1217 (!) 131     Resp 08/03/17 1217 18     Temp 08/03/17 1217 97.8 F (36.6 C)     Temp Source 08/03/17 1217 Oral     SpO2 08/03/17 1217 100 %     Weight 08/03/17 1217 230 lb (104.3 kg)     Height 08/03/17 1217 5\' 4"  (1.626 m)     Head Circumference --      Peak Flow --      Pain Score 08/03/17 1216 8     Pain Loc --      Pain Edu? --      Excl. in [employer]? --     Constitutional: Alert and oriented. Well appearing and in no acute distress. Eyes: Conjunctivae are normal. Head: Atraumatic. Nose: No congestion/rhinnorhea. Mouth/Throat: Mucous membranes are moist.  Oropharynx non-erythematous. Neck: No stridor. Cardiovascular:Rapid rate, regular rhythm. Grossly normal heart sounds.  Good peripheral circulation. Respiratory: Normal respiratory effort.  No retractions. Lungs CTAB. Gastrointestinal: Soft tender to palpation percussion in the right upper quadrant No distention. No abdominal bruits. No CVA tenderness. Musculoskeletal: No lower extremity tenderness nor edema.  No joint effusions. Neurologic:  Normal speech and language. No gross focal neurologic deficits are appreciated.  Skin:  Skin is warm, dry and intact. No rash noted. Psychiatric: Mood and affect are normal. Speech and behavior are normal.  ____________________________________________   LABS (all labs ordered are listed, but only abnormal results are displayed)  Labs Reviewed  COMPREHENSIVE METABOLIC PANEL - Abnormal; Notable for the following:       Result Value   Sodium 134 (*)    Chloride 99 (*)    Glucose, Bld 393 (*)    BUN 34 (*)    Creatinine, Ser 1.45 (*)    Albumin 3.1 (*)    GFR calc non Af Amer 45 (*)    GFR calc Af Amer 53 (*)    All other components within normal limits  CBC - Abnormal; Notable for the following:     Hemoglobin 11.5 (*)  HCT 34.6 (*)    All other components within normal limits  URINALYSIS, COMPLETE (UACMP) WITH MICROSCOPIC - Abnormal; Notable for the following:    Color, Urine YELLOW (*)    APPearance CLOUDY (*)    Glucose, UA >=500 (*)    Hgb urine dipstick MODERATE (*)    Protein, ur >=300 (*)    Leukocytes, UA MODERATE (*)    Bacteria, UA RARE (*)    Squamous Epithelial / LPF 0-5 (*)    Non Squamous Epithelial 0-5 (*)    All other components within normal limits  TROPONIN I - Abnormal; Notable for the following:    Troponin I 0.03 (*)    All other components within normal limits  DIFFERENTIAL - Abnormal; Notable for the following:    Neutro Abs 7.2 (*)    All other components within normal limits  TROPONIN I - Abnormal; Notable for the following:    Troponin I 0.03 (*)    All other components within normal limits  LIPASE, BLOOD  HCG, QUANTITATIVE, PREGNANCY  POCT PREGNANCY, URINE  CBG MONITORING, ED   ____________________________________________  EKG  EKG read and interpreted by me shows sinus tach at a rate of 130 left axis no acute ST-T wave changes ____________________________________________  RADIOLOGY  Dg Chest Portable 1 View  Result Date: 08/03/2017 CLINICAL DATA:  Nausea and vomiting with right-sided abdominal pain EXAM: PORTABLE CHEST 1 VIEW COMPARISON:  03/11/2017 FINDINGS: The heart size and mediastinal contours are within normal limits. Both lungs are clear. The visualized skeletal structures are unremarkable. IMPRESSION: No active disease. Electronically Signed   By: Inez Catalina M.D.   On: 08/03/2017 14:04   US Abdomen Limited Ruq  Result Date: 08/03/2017 CLINICAL DATA:  Right upper quadrant pain EXAM: ULTRASOUND ABDOMEN LIMITED RIGHT UPPER QUADRANT COMPARISON:  08/05/2016 FINDINGS: Gallbladder: No gallstones or wall thickening visualized. No sonographic Murphy sign noted by sonographer. Common bile duct: Diameter: 3.2 mm Liver: Mildly heterogeneous  and increased in echogenicity consistent with fatty infiltration. Portal vein is patent on color Doppler imaging with normal direction of blood flow towards the liver. IMPRESSION: Mild fatty infiltration of the liver. Electronically Signed   By: Inez Catalina M.D.   On: 08/03/2017 14:09    ____________________________________________   PROCEDURES  Procedure(s) performed:  Procedures  Critical Care performed:   ____________________________________________   INITIAL IMPRESSION / ASSESSMENT AND PLAN / ED COURSE  Pertinent labs & imaging results that were available during my care of the patient were reviewed by me and considered in my medical decision making (see chart for details).  CT of the chest and belly is still pending. I will give her a Maalox and get a CBG patient has had 2 L of fluid to this point I will sign her out to Dr. Archie Balboa. Troponin was 0.03 but repeat 2 hours later shows no change     ____________________________________________   FINAL CLINICAL IMPRESSION(S) / ED DIAGNOSES  Final diagnoses:  Right upper quadrant abdominal pain      NEW MEDICATIONS STARTED DURING THIS VISIT:  New Prescriptions   No medications on file     Note:  This document was prepared using Dragon voice recognition software and may include unintentional dictation errors.    Nena Polio, MD 08/03/17 [phone]

## 2017-08-03 NOTE — ED Notes (Signed)
Pt unable to obtain urine specimen.

## 2017-08-03 NOTE — ED Notes (Signed)
Nurse val advised of trop level and she in turn notified dr. Cinda Quest.

## 2017-08-03 NOTE — ED Notes (Signed)
Report to Jonelle Sidle, RN 2C. Encouraged to call back with any questions.

## 2017-08-03 NOTE — ED Provider Notes (Signed)
CT abd/pel/chest IMPRESSION: No evidence of pulmonary emboli.  3 mm nodule in the lingula on the left. No follow-up needed if patient is low-risk. Non-contrast chest CT can be considered in 12 months if patient is high-risk. This recommendation follows the consensus statement: Guidelines for Management of Incidental Pulmonary Nodules Detected on CT Images: From the Fleischner Society 2017; Radiology 2017; 284:228-243.  No acute abnormality is noted in the abdomen/pelvis.    Discussed findings with patient which included finding of nodule. Patient remained tachycardic in the emergency department after two liters of fluid. Will plan on admission for further work up and evaluation.   Nance Pear, MD 08/03/17 [phone]

## 2017-08-03 NOTE — ED Notes (Signed)
Patient transported to Ultrasound 

## 2017-08-03 NOTE — ED Notes (Signed)
Patient transported to CT 

## 2017-08-03 NOTE — ED Notes (Signed)
Admitting MD at bedside at this time.

## 2017-08-03 NOTE — ED Triage Notes (Signed)
Patient from home via POV with complaint of heartburn for a week. Patient states that the pain is radiating through to her back with associated nausea and vomiting. Denies diarrhea. Patient still retains gallbladder but has had an appendectomy.

## 2017-08-04 ENCOUNTER — Encounter: Payer: Self-pay | Admitting: Cardiovascular Disease

## 2017-08-04 ENCOUNTER — Telehealth: Payer: Self-pay | Admitting: Unknown Physician Specialty

## 2017-08-04 DIAGNOSIS — R1011 Right upper quadrant pain: Secondary | ICD-10-CM

## 2017-08-04 LAB — CBC
HCT: 30.1 % — ABNORMAL LOW (ref 35.0–47.0)
Hemoglobin: 10.2 g/dL — ABNORMAL LOW (ref 12.0–16.0)
MCH: 27.1 pg (ref 26.0–34.0)
MCHC: 33.9 g/dL (ref 32.0–36.0)
MCV: 79.7 fL — ABNORMAL LOW (ref 80.0–100.0)
Platelets: 337 10*3/uL (ref 150–440)
RBC: 3.78 MIL/uL — ABNORMAL LOW (ref 3.80–5.20)
RDW: 14 % (ref 11.5–14.5)
WBC: 6.9 10*3/uL (ref 3.6–11.0)

## 2017-08-04 LAB — BASIC METABOLIC PANEL
Anion gap: 8 (ref 5–15)
BUN: 24 mg/dL — ABNORMAL HIGH (ref 6–20)
CO2: 24 mmol/L (ref 22–32)
Calcium: 8.2 mg/dL — ABNORMAL LOW (ref 8.9–10.3)
Chloride: 107 mmol/L (ref 101–111)
Creatinine, Ser: 1.17 mg/dL — ABNORMAL HIGH (ref 0.44–1.00)
GFR calc Af Amer: >60 mL/min (ref >60)
GFR calc non Af Amer: 59 mL/min — ABNORMAL LOW (ref >60)
Glucose, Bld: 247 mg/dL — ABNORMAL HIGH (ref 65–99)
Potassium: 3.8 mmol/L (ref 3.5–5.1)
Sodium: 139 mmol/L (ref 135–145)

## 2017-08-04 LAB — TROPONIN I
Troponin I: 0.03 ng/mL (ref <0.03)
Troponin I: <0.03 ng/mL (ref <0.03)

## 2017-08-04 LAB — GLUCOSE, CAPILLARY
Glucose-Capillary: 263 mg/dL — ABNORMAL HIGH (ref 65–99)
Glucose-Capillary: 279 mg/dL — ABNORMAL HIGH (ref 65–99)

## 2017-08-04 MED ORDER — MUPIROCIN 2 % EX OINT
1.0000 "application " | TOPICAL_OINTMENT | Freq: Two times a day (BID) | CUTANEOUS | Status: DC
  Administered 2017-08-04: 1 via NASAL
  Filled 2017-08-04: qty 22

## 2017-08-04 MED ORDER — CEFUROXIME AXETIL 250 MG PO TABS: 250.0000 mg | ORAL_TABLET | Freq: Two times a day (BID) | ORAL | 0 refills | Status: AC

## 2017-08-04 MED ORDER — METOCLOPRAMIDE HCL 10 MG PO TABS
10.0000 mg | ORAL_TABLET | Freq: Three times a day (TID) | ORAL | Status: DC
  Administered 2017-08-04: 10 mg via ORAL
  Filled 2017-08-04: qty 1

## 2017-08-04 MED ORDER — CHLORHEXIDINE GLUCONATE CLOTH 2 % EX PADS
6.0000 | MEDICATED_PAD | Freq: Every day | CUTANEOUS | Status: DC
  Administered 2017-08-04: 6 via TOPICAL

## 2017-08-04 NOTE — Telephone Encounter (Signed)
Entered in error

## 2017-08-04 NOTE — Consult Note (Signed)
[facility] Cardiology  CARDIOLOGY CONSULT NOTE  Patient ID: [name] [name] MRN: [id number] DOB/AGE: August 20, 1981 36 y.o.  Admit date: 08/03/2017 Referring Physician Posey Pronto Primary Physician Kathrine Haddock, NP Primary Cardiologist [place] Reason for Consultation Tachycardia  HPI: 36 year old female referred for evaluation of tachycardia. The patient has a history of coronary artery disease, status post NSTEMI and 2 stents in 04/7016, diastolic CHF, type 2 diabetes, hypertension, and OSA. The patient reports experiencing a one-week history of epigastric pain with radiation to her back that worsened last night after eating chicken. She had nausea and vomiting and was brought to [facility] ER. The patient was noted to be in sinus tachycardia at a rate of 130 bpm despite 2 L of fluids. Chest CT negative for pulmonary emboli. CT abdomen negative for acute abnormality. Troponin borderline elevated to 0.03, 0.04, and finally less than 0.03. 2-D echocardiogram on 09/26/16 revealed normal left ventricular function with LVEF 55-60% with normal wall motion. Cardiac catheterization on 04/2015 revealed patent stents in the proximal OM1 and mid left circumflex, and 40% stenosis proximal LAD. Currently, the patient reports mild nausea and epigastric pain at a intensity of 4/10. The patient reports no significant chest pain, but has had intermittent nonexertional chest tightness, which she reports is not new for her. She denies shortness of breath or palpitations. Telemetry reveals sinus rhythm at a rate of 98 bpm.  Review of systems complete and found to be negative unless listed above     Past Medical History:  Diagnosis Date  . Chronic systolic CHF (congestive heart failure) ([place])    a. echo 03/2015: EF 30-35%, mild concentric LVH, severe HK of inf, inflat, & lat walls, mod MR, mild TR;  b. 04/2015 LV Gram: EF 55-65%.  . Diabetes type 2, controlled ([place])    a. since 2002   . Heavy menses    a. H/O IUD - expired in 2014 -  remains in place.  . Hyperlipidemia   . Hypertension   . Iron deficiency anemia   . Ischemic cardiomyopathy    a. 03/2015 EF 30-35% post NSTEMI;  b. 04/2015 EF 55-65% on LV gram.  . Kidney disease   . MI (myocardial infarction) ([place]) 03/29/2015  . Obesity   . Renal insufficiency   . Tobacco abuse    a. quit 03/2015.  Marland Kitchen Uterine fibroid   . Vertigo   . Vitamin D deficiency     Past Surgical History:  Procedure Laterality Date  . APPENDECTOMY    . CARDIAC CATHETERIZATION  4/16   x2 stent [facility]  . CARDIAC CATHETERIZATION N/A 05/05/2015   Procedure: Left Heart Cath and Coronary Angiography;  Surgeon: Peter M Martinique, MD;  Location: [place] CV LAB;  Service: Cardiovascular;  Laterality: N/A;  . COLONOSCOPY WITH PROPOFOL N/A 07/31/2016   Procedure: COLONOSCOPY WITH PROPOFOL;  Surgeon: Lollie Sails, MD;  Location: [facility] ENDOSCOPY;  Service: Endoscopy;  Laterality: N/A;  . ESOPHAGOGASTRODUODENOSCOPY (EGD) WITH PROPOFOL N/A 07/31/2016   Procedure: ESOPHAGOGASTRODUODENOSCOPY (EGD) WITH PROPOFOL;  Surgeon: Lollie Sails, MD;  Location: [facility] ENDOSCOPY;  Service: Endoscopy;  Laterality: N/A;  . LAPAROSCOPIC APPENDECTOMY N/A 08/07/2016   Procedure: APPENDECTOMY LAPAROSCOPIC;  Surgeon: Hubbard Robinson, MD;  Location: [facility] ORS;  Service: General;  Laterality: N/A;  . MOUTH SURGERY      Prescriptions Prior to Admission  Medication Sig Dispense Refill Last Dose  . albuterol (PROVENTIL HFA;VENTOLIN HFA) 108 (90 Base) MCG/ACT inhaler Inhale 2 puffs into the lungs every 6 (six) hours as needed  for wheezing or shortness of breath. 1 Inhaler 2 prn at prn  . amitriptyline (ELAVIL) 10 MG tablet Take 1-2 tablets (10-20 mg total) by mouth at bedtime. Take 2 tablets at bedtime. Total of 20mg  nightly (Patient taking differently: Take 20 mg by mouth at bedtime. ) 30 tablet 3 Past Week at Unknown time  . aspirin EC 81 MG tablet Take 81 mg by mouth daily.   08/02/2017 at 0800  . benzonatate (TESSALON) 100 MG  capsule Take 1 capsule (100 mg total) by mouth 2 (two) times daily as needed for cough. 30 capsule 12 Past Week at Unknown time  . Cholecalciferol 2000 units TABS Take 1 tablet by mouth daily.   Past Week at Unknown time  . etonogestrel (NEXPLANON) 68 MG IMPL implant 1 each by Subdermal route once.   Unknown at Unknown  . ferrous sulfate 325 (65 FE) MG tablet TAKE ONE TABLET BY MOUTH THREE TIMES DAILY WITH MEALS (Patient taking differently: take one tablet by mouth once daily) 90 tablet 3 08/02/2017 at Unknown time  . glimepiride (AMARYL) 4 MG tablet TAKE ONE TABLET BY MOUTH ONCE DAILY 90 tablet 1 08/02/2017 at 0800  . isosorbide mononitrate (IMDUR) 30 MG 24 hr tablet TAKE ONE TABLET BY MOUTH ONCE DAILY 30 tablet 3 08/02/2017 at 0800  . JANUVIA 100 MG tablet TAKE 1 TABLET BY MOUTH ONCE DAILY 30 tablet 2 Past Week at Unknown time  . loperamide (IMODIUM) 2 MG capsule Take 2 mg by mouth as needed for diarrhea or loose stools.   prn at prn  . meclizine (ANTIVERT) 32 MG tablet Take 1 tablet (32 mg total) by mouth 3 (three) times daily as needed. (Patient taking differently: Take 32 mg by mouth 3 (three) times daily as needed for dizziness or nausea. ) 30 tablet 0 prn at prn  . metoCLOPramide (REGLAN) 10 MG tablet Take 1 tablet (10 mg total) by mouth 3 (three) times daily before meals. 90 tablet 2 08/02/2017 at 1800  . metoprolol (LOPRESSOR) 50 MG tablet Take 1 tablet (50 mg total) by mouth 2 (two) times daily. 180 tablet 1 08/02/2017 at 0800  . nitroGLYCERIN (NITROSTAT) 0.4 MG SL tablet Place 1 tablet (0.4 mg total) under the tongue every 5 (five) minutes as needed for chest pain. Reported on 02/20/2016 50 tablet 3 prn at prn  . ondansetron (ZOFRAN ODT) 8 MG disintegrating tablet Take 1 tablet (8 mg total) by mouth every 8 (eight) hours as needed for nausea or vomiting. 90 tablet 1 prn at prn  . pantoprazole (PROTONIX) 40 MG tablet TAKE ONE TABLET BY MOUTH ONCE DAILY AT 6 AM. 90 tablet 1 08/02/2017 at 0800  .  rifaximin (XIFAXAN) 550 MG TABS tablet Take 1 tablet (550 mg total) by mouth 3 (three) times daily. 42 tablet 0 08/02/2017 at 1800  . rosuvastatin (CRESTOR) 5 MG tablet TAKE ONE TABLET BY MOUTH ONCE DAILY 30 tablet 3 Past Week at Unknown time  . TRULICITY 1.91 YN/8.2NF SOPN INJECT ONE SYRINGEFUL INTO THE SKIN ONCE WEEKLY 12 pen 3 08/01/2017 at Unknown time  . venlafaxine XR (EFFEXOR-XR) 150 MG 24 hr capsule Take 150 mg by mouth daily with breakfast.    08/02/2017 at 0800  . glucose blood (ACCU-CHEK ACTIVE STRIPS) test strip Use as instructed 100 each 12 Taking  . naproxen (NAPROSYN) 375 MG tablet Take 1 tablet (375 mg total) by mouth 2 (two) times daily with a meal. 16 tablet 0 Taking  . nitrofurantoin, macrocrystal-monohydrate, (MACROBID) 100  MG capsule Take 1 capsule (100 mg total) by mouth 2 (two) times daily. (Patient not taking: Reported on 08/03/2017) 14 capsule 0 Completed Course at Unknown time  . rosuvastatin (CRESTOR) 5 MG tablet Take 1 tablet (5 mg total) by mouth every other day. 30 tablet 5 Taking   Social History   Social History  . Marital status: Single    Spouse name: N/A  . Number of children: N/A  . Years of education: N/A   Occupational History  . Not on file.   Social History Main Topics  . Smoking status: Former Smoker    Years: 15.00    Quit date: 03/10/2015  . Smokeless tobacco: Never Used  . Alcohol use No  . Drug use: No  . Sexual activity: Yes    Birth control/ protection: Implant   Other Topics Concern  . Not on file   Social History Narrative   Lives locally with daughter and fiance.    Family History  Problem Relation Age of Onset  . Diabetes Father   . Cancer Maternal Grandmother        lung  . Cancer Maternal Grandfather        prostate  . Diabetes Paternal Grandfather   . Cancer - Cervical Maternal Aunt       Review of systems complete and found to be negative unless listed above      PHYSICAL EXAM  General: Well developed, well  nourished, in no acute distress HEENT:  Normocephalic and atramatic Neck:  No JVD.  Lungs: Clear bilaterally to auscultation. Normal effort of breathing. Heart: HRRR . Normal S1 and S2 without gallops or murmurs.  Abdomen: Bowel sounds are positive, abdomen soft and non-tender  Msk:  Back normal, able to sit upright in bed Extremities: No clubbing, cyanosis or edema.   Neuro: Alert and oriented X 3. Psych:  Good affect, responds appropriately  Labs:   Lab Results  Component Value Date   WBC 6.9 08/04/2017   HGB 10.2 (L) 08/04/2017   HCT 30.1 (L) 08/04/2017   MCV 79.7 (L) 08/04/2017   PLT 337 08/04/2017    Recent Labs Lab 08/03/17 1219 08/04/17 0834  NA 134* 139  K 3.9 3.8  CL 99* 107  CO2 23 24  BUN 34* 24*  CREATININE 1.45* 1.17*  CALCIUM 9.4 8.2*  PROT 7.4  --   BILITOT 0.4  --   ALKPHOS 124  --   ALT 19  --   AST 24  --   GLUCOSE 393* 247*   Lab Results  Component Value Date   CKTOTAL 359 (H) 12/10/2016   CKMB 31.5 (H) 03/31/2015   TROPONINI <0.03 08/04/2017    Lab Results  Component Value Date   CHOL 138 01/10/2017   CHOL 165 12/10/2016   CHOL 244 (H) 09/02/2016   Lab Results  Component Value Date   HDL 39 (L) 01/10/2017   HDL 42 12/10/2016   HDL 58 09/02/2016   Lab Results  Component Value Date   LDLCALC 82 01/10/2017   LDLCALC 99 12/10/2016   LDLCALC 162 (H) 09/02/2016   Lab Results  Component Value Date   TRIG 84 01/10/2017   TRIG 120 12/10/2016   TRIG 120 09/02/2016   Lab Results  Component Value Date   CHOLHDL 3.5 01/10/2017   CHOLHDL 3.9 12/10/2016   CHOLHDL 2.9 05/16/2015   No results found for: LDLDIRECT    Radiology: Ct Angio Chest Pe W And/or Wo Contrast  Result Date: 08/03/2017 CLINICAL DATA:  Chest pain EXAM: CT ANGIOGRAPHY CHEST CT ABDOMEN AND PELVIS WITH CONTRAST TECHNIQUE: Multidetector CT imaging of the chest was performed using the standard protocol during bolus administration of intravenous contrast. Multiplanar CT  image reconstructions and MIPs were obtained to evaluate the vascular anatomy. Multidetector CT imaging of the abdomen and pelvis was performed using the standard protocol during bolus administration of intravenous contrast. CONTRAST:  100 mL Isovue 370. COMPARISON:  None. FINDINGS: CTA CHEST FINDINGS Cardiovascular: Thoracic aorta is within normal limits. No atherosclerotic changes or aneurysmal dilatation is seen. The pulmonary artery shows a normal branching pattern. No definitive pulmonary embolism is seen. No right heart strain is noted. No cardiac enlargement is seen. Very mild coronary calcifications are seen. Mediastinum/Nodes: The esophagus is air filled but otherwise within normal limits. The thoracic inlet is unremarkable. No mediastinal or hilar adenopathy is noted. Lungs/Pleura: Lungs are well aerated bilaterally. A small 3 mm nodule is noted in the lingula along the fissure best seen on image number 38 of series 8. No other nodular changes are seen. Musculoskeletal: No chest wall abnormality. No acute or significant osseous findings. Review of the MIP images confirms the above findings. CT ABDOMEN and PELVIS FINDINGS Hepatobiliary: Liver is diffusely fatty infiltrated. The gallbladder is within normal limits. Pancreas: Unremarkable. No pancreatic ductal dilatation or surrounding inflammatory changes. Spleen: Normal in size without focal abnormality. Adrenals/Urinary Tract: Adrenal glands are unremarkable. Kidneys are normal, without renal calculi, focal lesion, or hydronephrosis. Bladder is decompressed. Stomach/Bowel: No obstructive or inflammatory changes are seen. Ingested material is noted within the stomach. The appendix has been surgically removed. Vascular/Lymphatic: Aortic atherosclerosis. No enlarged abdominal or pelvic lymph nodes. Reproductive: Uterus and bilateral adnexa are unremarkable. Other: No abdominal wall hernia or abnormality. No abdominopelvic ascites. Musculoskeletal: No acute or  significant osseous findings. Review of the MIP images confirms the above findings. IMPRESSION: No evidence of pulmonary emboli. 3 mm nodule in the lingula on the left. No follow-up needed if patient is low-risk. Non-contrast chest CT can be considered in 12 months if patient is high-risk. This recommendation follows the consensus statement: Guidelines for Management of Incidental Pulmonary Nodules Detected on CT Images: From the Fleischner Society 2017; Radiology 2017; 284:228-243. No acute abnormality is noted in the abdomen/pelvis. Electronically Signed   By: Inez Catalina M.D.   On: 08/03/2017 16:16   Ct Abdomen Pelvis W Contrast  Result Date: 08/03/2017 CLINICAL DATA:  Chest pain EXAM: CT ANGIOGRAPHY CHEST CT ABDOMEN AND PELVIS WITH CONTRAST TECHNIQUE: Multidetector CT imaging of the chest was performed using the standard protocol during bolus administration of intravenous contrast. Multiplanar CT image reconstructions and MIPs were obtained to evaluate the vascular anatomy. Multidetector CT imaging of the abdomen and pelvis was performed using the standard protocol during bolus administration of intravenous contrast. CONTRAST:  100 mL Isovue 370. COMPARISON:  None. FINDINGS: CTA CHEST FINDINGS Cardiovascular: Thoracic aorta is within normal limits. No atherosclerotic changes or aneurysmal dilatation is seen. The pulmonary artery shows a normal branching pattern. No definitive pulmonary embolism is seen. No right heart strain is noted. No cardiac enlargement is seen. Very mild coronary calcifications are seen. Mediastinum/Nodes: The esophagus is air filled but otherwise within normal limits. The thoracic inlet is unremarkable. No mediastinal or hilar adenopathy is noted. Lungs/Pleura: Lungs are well aerated bilaterally. A small 3 mm nodule is noted in the lingula along the fissure best seen on image number 38 of series 8. No other nodular changes are  seen. Musculoskeletal: No chest wall abnormality. No acute  or significant osseous findings. Review of the MIP images confirms the above findings. CT ABDOMEN and PELVIS FINDINGS Hepatobiliary: Liver is diffusely fatty infiltrated. The gallbladder is within normal limits. Pancreas: Unremarkable. No pancreatic ductal dilatation or surrounding inflammatory changes. Spleen: Normal in size without focal abnormality. Adrenals/Urinary Tract: Adrenal glands are unremarkable. Kidneys are normal, without renal calculi, focal lesion, or hydronephrosis. Bladder is decompressed. Stomach/Bowel: No obstructive or inflammatory changes are seen. Ingested material is noted within the stomach. The appendix has been surgically removed. Vascular/Lymphatic: Aortic atherosclerosis. No enlarged abdominal or pelvic lymph nodes. Reproductive: Uterus and bilateral adnexa are unremarkable. Other: No abdominal wall hernia or abnormality. No abdominopelvic ascites. Musculoskeletal: No acute or significant osseous findings. Review of the MIP images confirms the above findings. IMPRESSION: No evidence of pulmonary emboli. 3 mm nodule in the lingula on the left. No follow-up needed if patient is low-risk. Non-contrast chest CT can be considered in 12 months if patient is high-risk. This recommendation follows the consensus statement: Guidelines for Management of Incidental Pulmonary Nodules Detected on CT Images: From the Fleischner Society 2017; Radiology 2017; 284:228-243. No acute abnormality is noted in the abdomen/pelvis. Electronically Signed   By: Inez Catalina M.D.   On: 08/03/2017 16:16   Dg Chest Portable 1 View  Result Date: 08/03/2017 CLINICAL DATA:  Nausea and vomiting with right-sided abdominal pain EXAM: PORTABLE CHEST 1 VIEW COMPARISON:  03/11/2017 FINDINGS: The heart size and mediastinal contours are within normal limits. Both lungs are clear. The visualized skeletal structures are unremarkable. IMPRESSION: No active disease. Electronically Signed   By: Inez Catalina M.D.   On: 08/03/2017  14:04   US Abdomen Limited Ruq  Result Date: 08/03/2017 CLINICAL DATA:  Right upper quadrant pain EXAM: ULTRASOUND ABDOMEN LIMITED RIGHT UPPER QUADRANT COMPARISON:  08/05/2016 FINDINGS: Gallbladder: No gallstones or wall thickening visualized. No sonographic Murphy sign noted by sonographer. Common bile duct: Diameter: 3.2 mm Liver: Mildly heterogeneous and increased in echogenicity consistent with fatty infiltration. Portal vein is patent on color Doppler imaging with normal direction of blood flow towards the liver. IMPRESSION: Mild fatty infiltration of the liver. Electronically Signed   By: Inez Catalina M.D.   On: 08/03/2017 14:09    EKG: Sinus tachycardia, 101 bpm  ASSESSMENT AND PLAN:  1. Sinus tachycardia, now in normal sinus rhythm, suspect that it is due to underlying abdominal pain. 2. Coronary artery disease, status post non-STEMI 03/2015 with successful DES to proximal OM1 and mid left circumflex. Repeat cardiac catheterization 04/2015 revealed patent stents and 40% stenosis proximal LAD with normal left ventricular function. The patient denies significant chest pain 3. Abdominal pain, CT abdomen negative for acute abnormalities, epigastric pain mild now, nausea minimal 4. Urinary tract infection, currently being treated with antibiotics 5. Diastolic CHF, does not appear fluid-overloaded 6. Hypertension, well controlled 7. Type 2 diabetes   Recommendations: 1. Continue current medications 2. Continue metoprolol tartrate 50 mg BID for now 3. No further cardiac diagnostics recommended at this time 4. Further recommendations pending patient's initial course  Signed: Clabe Seal, Hershal Coria 08/04/2017, 11:06 AM

## 2017-08-04 NOTE — Discharge Summary (Signed)
[facility] NAME: [name] [name]    MR#:  [id number]  DATE OF BIRTH:  10-13-1981  DATE OF ADMISSION:  08/03/2017 ADMITTING PHYSICIAN: Dustin Flock, MD  DATE OF DISCHARGE: 08/04/2017  PRIMARY CARE PHYSICIAN: Kathrine Haddock, NP    ADMISSION DIAGNOSIS:  Lower urinary tract infectious disease [N39.0] Tachycardia [R00.0] RUQ pain [R10.11] Right upper quadrant abdominal pain [R10.11]  DISCHARGE DIAGNOSIS:  Active Problems:   Abdominal pain   SECONDARY DIAGNOSIS:   Past Medical History:  Diagnosis Date  . Chronic systolic CHF (congestive heart failure) ([place])    a. echo 03/2015: EF 30-35%, mild concentric LVH, severe HK of inf, inflat, & lat walls, mod MR, mild TR;  b. 04/2015 LV Gram: EF 55-65%.  . Diabetes type 2, controlled ([place])    a. since 2002   . Heavy menses    a. H/O IUD - expired in 2014 - remains in place.  . Hyperlipidemia   . Hypertension   . Iron deficiency anemia   . Ischemic cardiomyopathy    a. 03/2015 EF 30-35% post NSTEMI;  b. 04/2015 EF 55-65% on LV gram.  . Kidney disease   . MI (myocardial infarction) ([place]) 03/29/2015  . Obesity   . Renal insufficiency   . Tobacco abuse    a. quit 03/2015.  Marland Kitchen Uterine fibroid   . Vertigo   . Vitamin D deficiency     HOSPITAL COURSE:   36 year old female with past medical history of hypertension, hyperlipidemia, ischemic cardiomyopathy ejection fraction of 9937%, chronic systolic CHF, diabetes, who presented to the hospital due to abdominal pain.  1.abdominal pain-etiology of this is unclear but seems to be chronic for the patient. Patient underwent a CT scan of the abdomen and pelvis which was negative for acute pathology.  -patient was seen by gastroenterology who did not think the patient had any acute intra-abdominal pathology.Marland Kitchen She was advised to follow-up with her outpatient gastroenterologist in [place]. - This could've been secondary to her diabetic  gastroparesis after being on her Reglan she is not tolerating her diet well without any further worsening abdominal pain nausea vomiting. As per gastroenterology and her pain was mostly musculoskeletal.  2. Urinary tract infection-this was based off the urinalysis on admission. Patient was treated with IV ceftriaxone, not being discharged on oral Ceftin.  3.history of chronic diarrhea-patient will continue her Xifaxan.  4. GERD-patient will continue Protonix.  5. Hyperlipidemia-patient will continue Crestor.  6. Essential hypertension-she will continue her metoprolol  7. DM type II w/out complication - pt. Will cont. Her Glimepiride, Trulicity, [place].    DISCHARGE CONDITIONS:   [place].   CONSULTS OBTAINED:  Treatment Team:  Isaias Cowman, MD  DRUG ALLERGIES:   Allergies  Allergen Reactions  . Lisinopril Anaphylaxis, Itching and Swelling  . Rosemary Oil Anaphylaxis  . Shellfish Allergy Itching and Swelling  . Metformin And Related Diarrhea, Nausea And Vomiting and Rash    DISCHARGE MEDICATIONS:   Allergies as of 08/04/2017      Reactions   Lisinopril Anaphylaxis, Itching, Swelling   Rosemary Oil Anaphylaxis   Shellfish Allergy Itching, Swelling   Metformin And Related Diarrhea, Nausea And Vomiting, Rash      Medication List    STOP taking these medications   nitrofurantoin (macrocrystal-monohydrate) 100 MG capsule Commonly known as:  MACROBID     TAKE these medications   albuterol 108 (90 Base) MCG/ACT inhaler Commonly known as:  PROVENTIL HFA;VENTOLIN HFA Inhale 2 puffs  into the lungs every 6 (six) hours as needed for wheezing or shortness of breath.   amitriptyline 10 MG tablet Commonly known as:  ELAVIL Take 1-2 tablets (10-20 mg total) by mouth at bedtime. Take 2 tablets at bedtime. Total of 20mg  nightly What changed:  how much to take  additional instructions   aspirin EC 81 MG tablet Take 81 mg by mouth daily.   benzonatate 100 MG  capsule Commonly known as:  TESSALON Take 1 capsule (100 mg total) by mouth 2 (two) times daily as needed for cough.   cefUROXime 250 MG tablet Commonly known as:  CEFTIN Take 1 tablet (250 mg total) by mouth 2 (two) times daily with a meal.   Cholecalciferol 2000 units Tabs Take 1 tablet by mouth daily.   ferrous sulfate 325 (65 FE) MG tablet TAKE ONE TABLET BY MOUTH THREE TIMES DAILY WITH MEALS What changed:  See the new instructions.   glimepiride 4 MG tablet Commonly known as:  AMARYL TAKE ONE TABLET BY MOUTH ONCE DAILY   glucose blood test strip Commonly known as:  ACCU-CHEK ACTIVE STRIPS Use as instructed   isosorbide mononitrate 30 MG 24 hr tablet Commonly known as:  IMDUR TAKE ONE TABLET BY MOUTH ONCE DAILY   JANUVIA 100 MG tablet Generic drug:  sitaGLIPtin TAKE 1 TABLET BY MOUTH ONCE DAILY   loperamide 2 MG capsule Commonly known as:  IMODIUM Take 2 mg by mouth as needed for diarrhea or loose stools.   meclizine 32 MG tablet Commonly known as:  ANTIVERT Take 1 tablet (32 mg total) by mouth 3 (three) times daily as needed. What changed:  reasons to take this   metoCLOPramide 10 MG tablet Commonly known as:  REGLAN Take 1 tablet (10 mg total) by mouth 3 (three) times daily before meals.   metoprolol tartrate 50 MG tablet Commonly known as:  LOPRESSOR Take 1 tablet (50 mg total) by mouth 2 (two) times daily.   naproxen 375 MG tablet Commonly known as:  NAPROSYN Take 1 tablet (375 mg total) by mouth 2 (two) times daily with a meal.   NEXPLANON 68 MG Impl implant Generic drug:  etonogestrel 1 each by Subdermal route once.   nitroGLYCERIN 0.4 MG SL tablet Commonly known as:  NITROSTAT Place 1 tablet (0.4 mg total) under the tongue every 5 (five) minutes as needed for chest pain. Reported on 02/20/2016   ondansetron 8 MG disintegrating tablet Commonly known as:  ZOFRAN ODT Take 1 tablet (8 mg total) by mouth every 8 (eight) hours as needed for nausea or  vomiting.   pantoprazole 40 MG tablet Commonly known as:  PROTONIX TAKE ONE TABLET BY MOUTH ONCE DAILY AT 6 AM.   rifaximin 550 MG Tabs tablet Commonly known as:  XIFAXAN Take 1 tablet (550 mg total) by mouth 3 (three) times daily.   rosuvastatin 5 MG tablet Commonly known as:  CRESTOR Take 1 tablet (5 mg total) by mouth every other day.   rosuvastatin 5 MG tablet Commonly known as:  CRESTOR TAKE ONE TABLET BY MOUTH ONCE DAILY   TRULICITY 6.60 YT/0.1SW Sopn Generic drug:  Dulaglutide INJECT ONE SYRINGEFUL INTO THE SKIN ONCE WEEKLY   venlafaxine XR 150 MG 24 hr capsule Commonly known as:  EFFEXOR-XR Take 150 mg by mouth daily with breakfast.            Discharge Care Instructions        Start     Ordered   08/04/17 0000  cefUROXime (CEFTIN) 250 MG tablet  2 times daily with meals     08/04/17 1138   08/04/17 0000  Diet - low sodium heart healthy     08/04/17 1349   08/04/17 0000  Activity as tolerated - No restrictions     08/04/17 1349   08/04/17 0000  Diet Carb Modified     08/04/17 1349        DISCHARGE INSTRUCTIONS:   DIET:  Cardiac diet and Diabetic diet  DISCHARGE CONDITION:  Stable  ACTIVITY:  Activity as tolerated  OXYGEN:  Home Oxygen: No.   Oxygen Delivery: room air  DISCHARGE LOCATION:  home   If you experience worsening of your admission symptoms, develop shortness of breath, life threatening emergency, suicidal or homicidal thoughts you must seek medical attention immediately by calling 911 or calling your MD immediately  if symptoms less severe.  You Must read complete instructions/literature along with all the possible adverse reactions/side effects for all the Medicines you take and that have been prescribed to you. Take any new Medicines after you have completely understood and accpet all the possible adverse reactions/side effects.   Please note  You were cared for by a hospitalist during your hospital stay. If you have any  questions about your discharge medications or the care you received while you were in the hospital after you are discharged, you can call the unit and asked to speak with the hospitalist on call if the hospitalist that took care of you is not available. Once you are discharged, your primary care physician will handle any further medical issues. Please note that NO REFILLS for any discharge medications will be authorized once you are discharged, as it is imperative that you return to your primary care physician (or establish a relationship with a primary care physician if you do not have one) for your aftercare needs so that they can reassess your need for medications and monitor your lab values.     Today   No abdominal pain presently and tolerating PO well. No n/v and seen by GI and no plans for any intervention. Follow up as outpatient.   VITAL SIGNS:  Blood pressure 136/84, pulse (!) 101, temperature 98.4 F (36.9 C), temperature source Oral, resp. rate 16, height 5\' 4"  (1.626 m), weight 105 kg (231 lb 8 oz), SpO2 100 %.  I/O:   Intake/Output Summary (Last 24 hours) at 08/04/17 1454 Last data filed at 08/04/17 1023  Gross per 24 hour  Intake             2006 ml  Output                0 ml  Net             2006 ml    PHYSICAL EXAMINATION:  GENERAL:  36 y.o.-year-old obese patient lying in the bed in no acute distress.  EYES: Pupils equal, round, reactive to light and accommodation. No scleral icterus. Extraocular muscles intact.  HEENT: Head atraumatic, normocephalic. Oropharynx and nasopharynx clear.  NECK:  Supple, no jugular venous distention. No thyroid enlargement, no tenderness.  LUNGS: Normal breath sounds bilaterally, no wheezing, rales,rhonchi. No use of accessory muscles of respiration.  CARDIOVASCULAR: S1, S2 normal. No murmurs, rubs, or gallops.  ABDOMEN: Soft, non-tender, non-distended. Bowel sounds present. No organomegaly or mass.  EXTREMITIES: No pedal edema,  cyanosis, or clubbing.  NEUROLOGIC: Cranial nerves II through XII are intact. No focal motor or sensory defecits b/l.  PSYCHIATRIC: The patient is alert and oriented x 3.  SKIN: No obvious rash, lesion, or ulcer.   DATA REVIEW:   CBC  Recent Labs Lab 08/04/17 0834  WBC 6.9  HGB 10.2*  HCT 30.1*  PLT 337    Chemistries   Recent Labs Lab 08/03/17 1219 08/04/17 0834  NA 134* 139  K 3.9 3.8  CL 99* 107  CO2 23 24  GLUCOSE 393* 247*  BUN 34* 24*  CREATININE 1.45* 1.17*  CALCIUM 9.4 8.2*  AST 24  --   ALT 19  --   ALKPHOS 124  --   BILITOT 0.4  --     Cardiac Enzymes  Recent Labs Lab 08/04/17 0834  TROPONINI <0.03    Microbiology Results  Results for orders placed or performed during the hospital encounter of 08/03/17  MRSA PCR Screening     Status: Abnormal   Collection Time: 08/03/17  8:45 PM  Result Value Ref Range Status   MRSA by PCR POSITIVE (A) NEGATIVE Final    Comment:        The GeneXpert MRSA Assay (FDA approved for NASAL specimens only), is one component of a comprehensive MRSA colonization surveillance program. It is not intended to diagnose MRSA infection nor to guide or monitor treatment for MRSA infections. RESULT CALLED TO, READ BACK BY AND VERIFIED WITH: TAMARA CONYERS AT 2214 ON 8/26/20108 JJB     RADIOLOGY:  Ct Angio Chest Pe W And/or Wo Contrast  Result Date: 08/03/2017 CLINICAL DATA:  Chest pain EXAM: CT ANGIOGRAPHY CHEST CT ABDOMEN AND PELVIS WITH CONTRAST TECHNIQUE: Multidetector CT imaging of the chest was performed using the standard protocol during bolus administration of intravenous contrast. Multiplanar CT image reconstructions and MIPs were obtained to evaluate the vascular anatomy. Multidetector CT imaging of the abdomen and pelvis was performed using the standard protocol during bolus administration of intravenous contrast. CONTRAST:  100 mL Isovue 370. COMPARISON:  None. FINDINGS: CTA CHEST FINDINGS Cardiovascular:  Thoracic aorta is within normal limits. No atherosclerotic changes or aneurysmal dilatation is seen. The pulmonary artery shows a normal branching pattern. No definitive pulmonary embolism is seen. No right heart strain is noted. No cardiac enlargement is seen. Very mild coronary calcifications are seen. Mediastinum/Nodes: The esophagus is air filled but otherwise within normal limits. The thoracic inlet is unremarkable. No mediastinal or hilar adenopathy is noted. Lungs/Pleura: Lungs are well aerated bilaterally. A small 3 mm nodule is noted in the lingula along the fissure best seen on image number 38 of series 8. No other nodular changes are seen. Musculoskeletal: No chest wall abnormality. No acute or significant osseous findings. Review of the MIP images confirms the above findings. CT ABDOMEN and PELVIS FINDINGS Hepatobiliary: Liver is diffusely fatty infiltrated. The gallbladder is within normal limits. Pancreas: Unremarkable. No pancreatic ductal dilatation or surrounding inflammatory changes. Spleen: Normal in size without focal abnormality. Adrenals/Urinary Tract: Adrenal glands are unremarkable. Kidneys are normal, without renal calculi, focal lesion, or hydronephrosis. Bladder is decompressed. Stomach/Bowel: No obstructive or inflammatory changes are seen. Ingested material is noted within the stomach. The appendix has been surgically removed. Vascular/Lymphatic: Aortic atherosclerosis. No enlarged abdominal or pelvic lymph nodes. Reproductive: Uterus and bilateral adnexa are unremarkable. Other: No abdominal wall hernia or abnormality. No abdominopelvic ascites. Musculoskeletal: No acute or significant osseous findings. Review of the MIP images confirms the above findings. IMPRESSION: No evidence of pulmonary emboli. 3 mm nodule in the lingula on the left. No follow-up needed if patient is  low-risk. Non-contrast chest CT can be considered in 12 months if patient is high-risk. This recommendation follows  the consensus statement: Guidelines for Management of Incidental Pulmonary Nodules Detected on CT Images: From the Fleischner Society 2017; Radiology 2017; 284:228-243. No acute abnormality is noted in the abdomen/pelvis. Electronically Signed   By: Inez Catalina M.D.   On: 08/03/2017 16:16   Ct Abdomen Pelvis W Contrast  Result Date: 08/03/2017 CLINICAL DATA:  Chest pain EXAM: CT ANGIOGRAPHY CHEST CT ABDOMEN AND PELVIS WITH CONTRAST TECHNIQUE: Multidetector CT imaging of the chest was performed using the standard protocol during bolus administration of intravenous contrast. Multiplanar CT image reconstructions and MIPs were obtained to evaluate the vascular anatomy. Multidetector CT imaging of the abdomen and pelvis was performed using the standard protocol during bolus administration of intravenous contrast. CONTRAST:  100 mL Isovue 370. COMPARISON:  None. FINDINGS: CTA CHEST FINDINGS Cardiovascular: Thoracic aorta is within normal limits. No atherosclerotic changes or aneurysmal dilatation is seen. The pulmonary artery shows a normal branching pattern. No definitive pulmonary embolism is seen. No right heart strain is noted. No cardiac enlargement is seen. Very mild coronary calcifications are seen. Mediastinum/Nodes: The esophagus is air filled but otherwise within normal limits. The thoracic inlet is unremarkable. No mediastinal or hilar adenopathy is noted. Lungs/Pleura: Lungs are well aerated bilaterally. A small 3 mm nodule is noted in the lingula along the fissure best seen on image number 38 of series 8. No other nodular changes are seen. Musculoskeletal: No chest wall abnormality. No acute or significant osseous findings. Review of the MIP images confirms the above findings. CT ABDOMEN and PELVIS FINDINGS Hepatobiliary: Liver is diffusely fatty infiltrated. The gallbladder is within normal limits. Pancreas: Unremarkable. No pancreatic ductal dilatation or surrounding inflammatory changes. Spleen:  Normal in size without focal abnormality. Adrenals/Urinary Tract: Adrenal glands are unremarkable. Kidneys are normal, without renal calculi, focal lesion, or hydronephrosis. Bladder is decompressed. Stomach/Bowel: No obstructive or inflammatory changes are seen. Ingested material is noted within the stomach. The appendix has been surgically removed. Vascular/Lymphatic: Aortic atherosclerosis. No enlarged abdominal or pelvic lymph nodes. Reproductive: Uterus and bilateral adnexa are unremarkable. Other: No abdominal wall hernia or abnormality. No abdominopelvic ascites. Musculoskeletal: No acute or significant osseous findings. Review of the MIP images confirms the above findings. IMPRESSION: No evidence of pulmonary emboli. 3 mm nodule in the lingula on the left. No follow-up needed if patient is low-risk. Non-contrast chest CT can be considered in 12 months if patient is high-risk. This recommendation follows the consensus statement: Guidelines for Management of Incidental Pulmonary Nodules Detected on CT Images: From the Fleischner Society 2017; Radiology 2017; 284:228-243. No acute abnormality is noted in the abdomen/pelvis. Electronically Signed   By: Inez Catalina M.D.   On: 08/03/2017 16:16   Dg Chest Portable 1 View  Result Date: 08/03/2017 CLINICAL DATA:  Nausea and vomiting with right-sided abdominal pain EXAM: PORTABLE CHEST 1 VIEW COMPARISON:  03/11/2017 FINDINGS: The heart size and mediastinal contours are within normal limits. Both lungs are clear. The visualized skeletal structures are unremarkable. IMPRESSION: No active disease. Electronically Signed   By: Inez Catalina M.D.   On: 08/03/2017 14:04   US Abdomen Limited Ruq  Result Date: 08/03/2017 CLINICAL DATA:  Right upper quadrant pain EXAM: ULTRASOUND ABDOMEN LIMITED RIGHT UPPER QUADRANT COMPARISON:  08/05/2016 FINDINGS: Gallbladder: No gallstones or wall thickening visualized. No sonographic Murphy sign noted by sonographer. Common bile  duct: Diameter: 3.2 mm Liver: Mildly heterogeneous and increased in echogenicity  consistent with fatty infiltration. Portal vein is patent on color Doppler imaging with normal direction of blood flow towards the liver. IMPRESSION: Mild fatty infiltration of the liver. Electronically Signed   By: Inez Catalina M.D.   On: 08/03/2017 14:09      Management plans discussed with the patient, family and they are in agreement.  CODE STATUS:     Code Status Orders        Start     Ordered   08/03/17 2031  Full code  Continuous     08/03/17 2031   TOTAL TIME TAKING CARE OF THIS PATIENT: 40 minutes.    Henreitta Leber M.D on 08/04/2017 at 2:54 PM  Between 7am to 6pm - Pager - [phone]  After 6pm go to [email or web address] - [employer] [facility]  Office  [phone]  CC: Primary care physician; Kathrine Haddock, NP

## 2017-08-04 NOTE — Consult Note (Signed)
[name] Lame, MD [facility]  [address], [place], [place] [address] Phone: [phone] Fax : [phone]  Consultation  Referring Provider:     No ref. provider found Primary Care Physician:  Kathrine Haddock, NP Primary Gastroenterologist:  [facility]        Reason for Consultation:     Abdominal pain  Date of Admission:  08/03/2017 Date of Consultation:  08/04/2017         HPI:   [name] [name] is a 36 y.o. female who was seen by GI echo clinic for irritable bowel syndrome and abdominal pain. The patient was given dicyclomine and hyoscyamine and states it did not help so she went back to her primary care provider who sent her to a gastrologist in [place]. The patient has a history of irritable bowel syndrome and was having diarrhea. The patient states that she was started on Flagyl and felt better but then on her second dose of Flagyl she could not get the prescription filled and continued to have diarrhea. The patient also has a lot of nausea and vomiting. She has a history of gastroparesis and coronary artery disease. She was now admitted with sharp abdominal pain that she reports to be in the right side below her ribs in the midclavicular line. She has also had some nausea whenever she has the pain and she states that it is worse with eating.  Past Medical History:  Diagnosis Date  . Chronic systolic CHF (congestive heart failure) ([place])    a. echo 03/2015: EF 30-35%, mild concentric LVH, severe HK of inf, inflat, & lat walls, mod MR, mild TR;  b. 04/2015 LV Gram: EF 55-65%.  . Diabetes type 2, controlled ([place])    a. since 2002   . Heavy menses    a. H/O IUD - expired in 2014 - remains in place.  . Hyperlipidemia   . Hypertension   . Iron deficiency anemia   . Ischemic cardiomyopathy    a. 03/2015 EF 30-35% post NSTEMI;  b. 04/2015 EF 55-65% on LV gram.  . Kidney disease   . MI (myocardial infarction) ([place]) 03/29/2015  . Obesity   . Renal insufficiency   .  Tobacco abuse    a. quit 03/2015.  Marland Kitchen Uterine fibroid   . Vertigo   . Vitamin D deficiency     Past Surgical History:  Procedure Laterality Date  . APPENDECTOMY    . CARDIAC CATHETERIZATION  4/16   x2 stent [facility]  . CARDIAC CATHETERIZATION N/A 05/05/2015   Procedure: Left Heart Cath and Coronary Angiography;  Surgeon: Peter M Martinique, MD;  Location: [facility] CV LAB;  Service: Cardiovascular;  Laterality: N/A;  . COLONOSCOPY WITH PROPOFOL N/A 07/31/2016   Procedure: COLONOSCOPY WITH PROPOFOL;  Surgeon: Lollie Sails, MD;  Location: [facility] ENDOSCOPY;  Service: Endoscopy;  Laterality: N/A;  . ESOPHAGOGASTRODUODENOSCOPY (EGD) WITH PROPOFOL N/A 07/31/2016   Procedure: ESOPHAGOGASTRODUODENOSCOPY (EGD) WITH PROPOFOL;  Surgeon: Lollie Sails, MD;  Location: [facility] ENDOSCOPY;  Service: Endoscopy;  Laterality: N/A;  . LAPAROSCOPIC APPENDECTOMY N/A 08/07/2016   Procedure: APPENDECTOMY LAPAROSCOPIC;  Surgeon: Hubbard Robinson, MD;  Location: [facility] ORS;  Service: General;  Laterality: N/A;  . MOUTH SURGERY      Prior to Admission medications   Medication Sig Start Date End Date Taking? Authorizing Provider  albuterol (PROVENTIL HFA;VENTOLIN HFA) 108 (90 Base) MCG/ACT inhaler Inhale 2 puffs into the lungs every 6 (six) hours as needed for wheezing  or shortness of breath. 02/19/17  Yes Volney American, PA-C  amitriptyline (ELAVIL) 10 MG tablet Take 1-2 tablets (10-20 mg total) by mouth at bedtime. Take 2 tablets at bedtime. Total of 20mg  nightly Patient taking differently: Take 20 mg by mouth at bedtime.  05/15/17  Yes Armbruster, Renelda Loma, MD  aspirin EC 81 MG tablet Take 81 mg by mouth daily.   Yes [provider]  benzonatate (TESSALON) 100 MG capsule Take 1 capsule (100 mg total) by mouth 2 (two) times daily as needed for cough. 12/20/16  Yes Johnson, Megan P, DO  Cholecalciferol 2000 units TABS Take 1 tablet by mouth daily.   Yes [provider]  etonogestrel (NEXPLANON)  68 MG IMPL implant 1 each by Subdermal route once.   Yes [provider]  ferrous sulfate 325 (65 FE) MG tablet TAKE ONE TABLET BY MOUTH THREE TIMES DAILY WITH MEALS Patient taking differently: take one tablet by mouth once daily 03/03/17  Yes Kathrine Haddock, NP  glimepiride (AMARYL) 4 MG tablet TAKE ONE TABLET BY MOUTH ONCE DAILY 05/02/17  Yes Johnson, Megan P, DO  isosorbide mononitrate (IMDUR) 30 MG 24 hr tablet TAKE ONE TABLET BY MOUTH ONCE DAILY 04/30/17  Yes Wellington Hampshire, MD  JANUVIA 100 MG tablet TAKE 1 TABLET BY MOUTH ONCE DAILY 06/02/17  Yes Kathrine Haddock, NP  loperamide (IMODIUM) 2 MG capsule Take 2 mg by mouth as needed for diarrhea or loose stools.   Yes [provider]  meclizine (ANTIVERT) 32 MG tablet Take 1 tablet (32 mg total) by mouth 3 (three) times daily as needed. Patient taking differently: Take 32 mg by mouth 3 (three) times daily as needed for dizziness or nausea.  06/21/16  Yes Kathrine Haddock, NP  metoCLOPramide (REGLAN) 10 MG tablet Take 1 tablet (10 mg total) by mouth 3 (three) times daily before meals. 06/02/17  Yes Armbruster, Renelda Loma, MD  metoprolol (LOPRESSOR) 50 MG tablet Take 1 tablet (50 mg total) by mouth 2 (two) times daily. 03/21/17 08/03/17 Yes Wellington Hampshire, MD  nitroGLYCERIN (NITROSTAT) 0.4 MG SL tablet Place 1 tablet (0.4 mg total) under the tongue every 5 (five) minutes as needed for chest pain. Reported on 02/20/2016 03/17/17  Yes [name] A, FNP  ondansetron (ZOFRAN ODT) 8 MG disintegrating tablet Take 1 tablet (8 mg total) by mouth every 8 (eight) hours as needed for nausea or vomiting. 05/15/17  Yes Armbruster, Renelda Loma, MD  pantoprazole (PROTONIX) 40 MG tablet TAKE ONE TABLET BY MOUTH ONCE DAILY AT 6 AM. 04/28/17  Yes Volney American, PA-C  rifaximin (XIFAXAN) 550 MG TABS tablet Take 1 tablet (550 mg total) by mouth 3 (three) times daily. 06/20/17  Yes Armbruster, Renelda Loma, MD  rosuvastatin (CRESTOR) 5 MG tablet TAKE ONE  TABLET BY MOUTH ONCE DAILY 06/02/17  Yes Wellington Hampshire, MD  TRULICITY 3.76 EG/3.1DV SOPN INJECT ONE SYRINGEFUL INTO THE SKIN ONCE WEEKLY 06/23/17  Yes Kathrine Haddock, NP  venlafaxine XR (EFFEXOR-XR) 150 MG 24 hr capsule Take 150 mg by mouth daily with breakfast.    Yes [provider]  cefUROXime (CEFTIN) 250 MG tablet Take 1 tablet (250 mg total) by mouth 2 (two) times daily with a meal. 08/04/17 08/09/17  Henreitta Leber, MD  glucose blood (ACCU-CHEK ACTIVE STRIPS) test strip Use as instructed 03/25/16   Kathrine Haddock, NP  naproxen (NAPROSYN) 375 MG tablet Take 1 tablet (375 mg total) by mouth 2 (two) times daily with a  meal. 04/23/17 04/23/18  Schuyler Amor, MD  nitrofurantoin, macrocrystal-monohydrate, (MACROBID) 100 MG capsule Take 1 capsule (100 mg total) by mouth 2 (two) times daily. Patient not taking: Reported on 08/03/2017 04/30/17   Park Liter P, DO  rosuvastatin (CRESTOR) 5 MG tablet Take 1 tablet (5 mg total) by mouth every other day. 12/11/16 05/16/17  Wende Bushy, MD    Family History  Problem Relation Age of Onset  . Diabetes Father   . Cancer Maternal Grandmother        lung  . Cancer Maternal Grandfather        prostate  . Diabetes Paternal Grandfather   . Cancer - Cervical Maternal Aunt      Social History  Substance Use Topics  . Smoking status: Former Smoker    Years: 15.00    Quit date: 03/10/2015  . Smokeless tobacco: Never Used  . Alcohol use No    Allergies as of 08/03/2017 - Review Complete 08/03/2017  Allergen Reaction Noted  . Lisinopril Anaphylaxis, Itching, and Swelling 04/15/2015  . Rosemary oil Anaphylaxis 07/20/2016  . Shellfish allergy Itching and Swelling 04/13/2015  . Metformin and related Diarrhea, Nausea And Vomiting, and Rash 07/04/2016    Review of Systems:    All systems reviewed and negative except where noted in HPI.   Physical Exam:  Vital signs in last 24 hours: Temp:  [98.1 F (36.7 C)-98.4 F (36.9 C)] 98.4 F (36.9  C) (08/27 1218) Pulse Rate:  [96-126] 101 (08/27 1218) Resp:  [13-19] 16 (08/27 0900) BP: (117-163)/(77-101) 136/84 (08/27 1218) SpO2:  [100 %] 100 % (08/27 1218) Weight:  [231 lb 8 oz (105 kg)] 231 lb 8 oz (105 kg) (08/26 2034) Last BM Date: 08/02/17 General:   Pleasant, cooperative in NAD Head:  Normocephalic and atraumatic. Eyes:   No icterus.   Conjunctiva pink. PERRLA. Ears:  Normal auditory acuity. Neck:  Supple; no masses or thyroidomegaly Lungs: Respirations even and unlabored. Lungs clear to auscultation bilaterally.   No wheezes, crackles, or rhonchi.  Heart:  Regular rate and rhythm;  Without murmur, clicks, rubs or gallops Abdomen:  Soft, nondistended, Positive tenderness to 1 finger palpation while flexing the abdominal wall muscles by raising the legs of the patient above the bed. Normal bowel sounds. No appreciable masses or hepatomegaly.  No rebound or guarding.  Rectal:  Not performed. Msk:  Symmetrical without gross deformities.   Extremities:  Without edema, cyanosis or clubbing. Neurologic:  Alert and oriented x3;  grossly normal neurologically. Skin:  Intact without significant lesions or rashes. Cervical Nodes:  No significant cervical adenopathy. Psych:  Alert and cooperative. Normal affect.  LAB RESULTS:  Recent Labs  08/03/17 1219 08/04/17 0834  WBC 9.7 6.9  HGB 11.5* 10.2*  HCT 34.6* 30.1*  PLT 375 337   BMET  Recent Labs  08/03/17 1219 08/04/17 0834  NA 134* 139  K 3.9 3.8  CL 99* 107  CO2 23 24  GLUCOSE 393* 247*  BUN 34* 24*  CREATININE 1.45* 1.17*  CALCIUM 9.4 8.2*   LFT  Recent Labs  08/03/17 1219  PROT 7.4  ALBUMIN 3.1*  AST 24  ALT 19  ALKPHOS 124  BILITOT 0.4   PT/INR No results for input(s): LABPROT, INR in the last 72 hours.  STUDIES: Ct Angio Chest Pe W And/or Wo Contrast  Result Date: 08/03/2017 CLINICAL DATA:  Chest pain EXAM: CT ANGIOGRAPHY CHEST CT ABDOMEN AND PELVIS WITH CONTRAST TECHNIQUE: Multidetector CT  imaging of  the chest was performed using the standard protocol during bolus administration of intravenous contrast. Multiplanar CT image reconstructions and MIPs were obtained to evaluate the vascular anatomy. Multidetector CT imaging of the abdomen and pelvis was performed using the standard protocol during bolus administration of intravenous contrast. CONTRAST:  100 mL Isovue 370. COMPARISON:  None. FINDINGS: CTA CHEST FINDINGS Cardiovascular: Thoracic aorta is within normal limits. No atherosclerotic changes or aneurysmal dilatation is seen. The pulmonary artery shows a normal branching pattern. No definitive pulmonary embolism is seen. No right heart strain is noted. No cardiac enlargement is seen. Very mild coronary calcifications are seen. Mediastinum/Nodes: The esophagus is air filled but otherwise within normal limits. The thoracic inlet is unremarkable. No mediastinal or hilar adenopathy is noted. Lungs/Pleura: Lungs are well aerated bilaterally. A small 3 mm nodule is noted in the lingula along the fissure best seen on image number 38 of series 8. No other nodular changes are seen. Musculoskeletal: No chest wall abnormality. No acute or significant osseous findings. Review of the MIP images confirms the above findings. CT ABDOMEN and PELVIS FINDINGS Hepatobiliary: Liver is diffusely fatty infiltrated. The gallbladder is within normal limits. Pancreas: Unremarkable. No pancreatic ductal dilatation or surrounding inflammatory changes. Spleen: Normal in size without focal abnormality. Adrenals/Urinary Tract: Adrenal glands are unremarkable. Kidneys are normal, without renal calculi, focal lesion, or hydronephrosis. Bladder is decompressed. Stomach/Bowel: No obstructive or inflammatory changes are seen. Ingested material is noted within the stomach. The appendix has been surgically removed. Vascular/Lymphatic: Aortic atherosclerosis. No enlarged abdominal or pelvic lymph nodes. Reproductive: Uterus and  bilateral adnexa are unremarkable. Other: No abdominal wall hernia or abnormality. No abdominopelvic ascites. Musculoskeletal: No acute or significant osseous findings. Review of the MIP images confirms the above findings. IMPRESSION: No evidence of pulmonary emboli. 3 mm nodule in the lingula on the left. No follow-up needed if patient is low-risk. Non-contrast chest CT can be considered in 12 months if patient is high-risk. This recommendation follows the consensus statement: Guidelines for Management of Incidental Pulmonary Nodules Detected on CT Images: From the Fleischner Society 2017; Radiology 2017; 284:228-243. No acute abnormality is noted in the abdomen/pelvis. Electronically Signed   By: Inez Catalina M.D.   On: 08/03/2017 16:16   Ct Abdomen Pelvis W Contrast  Result Date: 08/03/2017 CLINICAL DATA:  Chest pain EXAM: CT ANGIOGRAPHY CHEST CT ABDOMEN AND PELVIS WITH CONTRAST TECHNIQUE: Multidetector CT imaging of the chest was performed using the standard protocol during bolus administration of intravenous contrast. Multiplanar CT image reconstructions and MIPs were obtained to evaluate the vascular anatomy. Multidetector CT imaging of the abdomen and pelvis was performed using the standard protocol during bolus administration of intravenous contrast. CONTRAST:  100 mL Isovue 370. COMPARISON:  None. FINDINGS: CTA CHEST FINDINGS Cardiovascular: Thoracic aorta is within normal limits. No atherosclerotic changes or aneurysmal dilatation is seen. The pulmonary artery shows a normal branching pattern. No definitive pulmonary embolism is seen. No right heart strain is noted. No cardiac enlargement is seen. Very mild coronary calcifications are seen. Mediastinum/Nodes: The esophagus is air filled but otherwise within normal limits. The thoracic inlet is unremarkable. No mediastinal or hilar adenopathy is noted. Lungs/Pleura: Lungs are well aerated bilaterally. A small 3 mm nodule is noted in the lingula along  the fissure best seen on image number 38 of series 8. No other nodular changes are seen. Musculoskeletal: No chest wall abnormality. No acute or significant osseous findings. Review of the MIP images confirms the above findings. CT ABDOMEN  and PELVIS FINDINGS Hepatobiliary: Liver is diffusely fatty infiltrated. The gallbladder is within normal limits. Pancreas: Unremarkable. No pancreatic ductal dilatation or surrounding inflammatory changes. Spleen: Normal in size without focal abnormality. Adrenals/Urinary Tract: Adrenal glands are unremarkable. Kidneys are normal, without renal calculi, focal lesion, or hydronephrosis. Bladder is decompressed. Stomach/Bowel: No obstructive or inflammatory changes are seen. Ingested material is noted within the stomach. The appendix has been surgically removed. Vascular/Lymphatic: Aortic atherosclerosis. No enlarged abdominal or pelvic lymph nodes. Reproductive: Uterus and bilateral adnexa are unremarkable. Other: No abdominal wall hernia or abnormality. No abdominopelvic ascites. Musculoskeletal: No acute or significant osseous findings. Review of the MIP images confirms the above findings. IMPRESSION: No evidence of pulmonary emboli. 3 mm nodule in the lingula on the left. No follow-up needed if patient is low-risk. Non-contrast chest CT can be considered in 12 months if patient is high-risk. This recommendation follows the consensus statement: Guidelines for Management of Incidental Pulmonary Nodules Detected on CT Images: From the Fleischner Society 2017; Radiology 2017; 284:228-243. No acute abnormality is noted in the abdomen/pelvis. Electronically Signed   By: Inez Catalina M.D.   On: 08/03/2017 16:16   Dg Chest Portable 1 View  Result Date: 08/03/2017 CLINICAL DATA:  Nausea and vomiting with right-sided abdominal pain EXAM: PORTABLE CHEST 1 VIEW COMPARISON:  03/11/2017 FINDINGS: The heart size and mediastinal contours are within normal limits. Both lungs are clear. The  visualized skeletal structures are unremarkable. IMPRESSION: No active disease. Electronically Signed   By: Inez Catalina M.D.   On: 08/03/2017 14:04   US Abdomen Limited Ruq  Result Date: 08/03/2017 CLINICAL DATA:  Right upper quadrant pain EXAM: ULTRASOUND ABDOMEN LIMITED RIGHT UPPER QUADRANT COMPARISON:  08/05/2016 FINDINGS: Gallbladder: No gallstones or wall thickening visualized. No sonographic Murphy sign noted by sonographer. Common bile duct: Diameter: 3.2 mm Liver: Mildly heterogeneous and increased in echogenicity consistent with fatty infiltration. Portal vein is patent on color Doppler imaging with normal direction of blood flow towards the liver. IMPRESSION: Mild fatty infiltration of the liver. Electronically Signed   By: Inez Catalina M.D.   On: 08/03/2017 14:09      Impression / Plan:   Tya Haughey is a 36 y.o. y/o female with Abdominal pain that is clearly musculoskeletal and made worse with flexion of the abdominal wall muscles by raising the patient's legs above the bed while palpating her abdominal wall with 1 finger. The patient reports that none of the previous doctors have checked her abdominal wall by raising her legs or having her try and sitting up to see if that exacerbated the pain. The patient has been doing better with Reglan for her gastroparesis. The patient has already been established with a gastrologist and has been told that she can follow-up with her [facility]. The patient needs no further GI workup for this abdominal pain since it is musculoskeletal. I will sign off.  Please call if any further GI concerns or questions.  We would like to thank you for the opportunity to participate in the care of [name] [name].     Thank you for involving me in the care of this patient.      LOS: 1 day   [name] Lame, MD  08/04/2017, 2:28 PM   Note: This dictation was prepared with Dragon dictation along with smaller phrase technology. Any transcriptional errors  that result from this process are unintentional.

## 2017-08-05 LAB — URINE CULTURE

## 2017-08-05 LAB — HIV ANTIBODY (ROUTINE TESTING W REFLEX): HIV Screen 4th Generation wRfx: NONREACTIVE

## 2017-08-08 ENCOUNTER — Ambulatory Visit: Payer: Medicaid Other | Admitting: Unknown Physician Specialty

## 2017-08-12 ENCOUNTER — Ambulatory Visit (INDEPENDENT_AMBULATORY_CARE_PROVIDER_SITE_OTHER): Payer: Medicaid Other | Admitting: Unknown Physician Specialty

## 2017-08-12 ENCOUNTER — Encounter: Payer: Self-pay | Admitting: Unknown Physician Specialty

## 2017-08-12 VITALS — BP 125/85 | HR 115 | Temp 98.7°F | Wt 231.6 lb

## 2017-08-12 DIAGNOSIS — N643 Galactorrhea not associated with childbirth: Secondary | ICD-10-CM | POA: Diagnosis not present

## 2017-08-12 DIAGNOSIS — Z09 Encounter for follow-up examination after completed treatment for conditions other than malignant neoplasm: Secondary | ICD-10-CM | POA: Diagnosis not present

## 2017-08-12 DIAGNOSIS — E1122 Type 2 diabetes mellitus with diabetic chronic kidney disease: Secondary | ICD-10-CM | POA: Diagnosis not present

## 2017-08-12 DIAGNOSIS — R109 Unspecified abdominal pain: Secondary | ICD-10-CM | POA: Diagnosis not present

## 2017-08-12 DIAGNOSIS — N181 Chronic kidney disease, stage 1: Secondary | ICD-10-CM | POA: Diagnosis not present

## 2017-08-12 DIAGNOSIS — R911 Solitary pulmonary nodule: Secondary | ICD-10-CM | POA: Insufficient documentation

## 2017-08-12 DIAGNOSIS — I10 Essential (primary) hypertension: Secondary | ICD-10-CM | POA: Diagnosis not present

## 2017-08-12 DIAGNOSIS — Z23 Encounter for immunization: Secondary | ICD-10-CM

## 2017-08-12 DIAGNOSIS — F322 Major depressive disorder, single episode, severe without psychotic features: Secondary | ICD-10-CM | POA: Diagnosis not present

## 2017-08-12 DIAGNOSIS — IMO0001 Reserved for inherently not codable concepts without codable children: Secondary | ICD-10-CM | POA: Insufficient documentation

## 2017-08-12 DIAGNOSIS — E785 Hyperlipidemia, unspecified: Secondary | ICD-10-CM | POA: Diagnosis not present

## 2017-08-12 DIAGNOSIS — G8929 Other chronic pain: Secondary | ICD-10-CM

## 2017-08-12 NOTE — Assessment & Plan Note (Signed)
Stable, continue present medications.   

## 2017-08-12 NOTE — Progress Notes (Signed)
BP 125/85   Pulse (!) 115   Temp 98.7 F (37.1 C)   Wt 231 lb 9.6 oz (105.1 kg)   SpO2 99%   BMI 39.75 kg/m    Subjective:    Patient ID: [name] [name], female    DOB: Jul 22, 1981, 36 y.o.   MRN: [id number]  HPI: [name] [name] is a 36 y.o. female  Chief Complaint  Patient presents with  . Hospitalization Follow-up   Pt states she is primarily dizzy, weak, and tired.  She was admitted for abdominal pain.  Still has it.  This has been a long term off and on complaint.  She has been evaluated by GI and was supposed to f/u after hospitalization but hasn't made the appointment yet.  We have worked through this issue here and decided no difference on or off the Trulicity.  Discharge note reviewed.  3 mm lung nodule noted.  No f/u needed for low risk pt  Hypertension Using medications without difficulty Average home BPs   No problems or lightheadedness No chest pain with exertion or shortness of breath No Edema   Hyperlipidemia Using medications without problems: No Muscle aches  Diet compliance: Exercise:  Depression States she is "better."  She is under the care of a psychiatrist Depression screen [facility] 2/9 08/12/2017 05/16/2017 03/17/2017 12/16/2016 09/05/2016  Decreased Interest 2 0 0 1 0  Down, Depressed, Hopeless 2 0 0 1 0  PHQ - 2 Score 4 0 0 2 0  Altered sleeping 3 - - 1 -  Tired, decreased energy 2 - - 3 -  Change in appetite 2 - - 1 -  Feeling bad or failure about yourself  2 - - 1 -  Trouble concentrating 2 - - 1 -  Moving slowly or fidgety/restless 0 - - 0 -  Suicidal thoughts 0 - - 1 -  PHQ-9 Score 15 - - 10 -  Difficult doing work/chores - - - Very difficult -   Elevated ESR/CRP Needs f/u of   Galactorrhea States she can squeeze white milk from her breasts  Relevant past medical, surgical, family and social history reviewed and updated as indicated. Interim medical history since our last visit reviewed. Allergies and medications reviewed and  updated.  Review of Systems  Per HPI unless specifically indicated above     Objective:    BP 125/85   Pulse (!) 115   Temp 98.7 F (37.1 C)   Wt 231 lb 9.6 oz (105.1 kg)   SpO2 99%   BMI 39.75 kg/m   Wt Readings from Last 3 Encounters:  08/12/17 231 lb 9.6 oz (105.1 kg)  08/03/17 231 lb 8 oz (105 kg)  05/16/17 228 lb (103.4 kg)    Physical Exam  Constitutional: She is oriented to person, place, and time. She appears well-developed and well-nourished. No distress.  HENT:  Head: Normocephalic and atraumatic.  Eyes: Conjunctivae and lids are normal. Right eye exhibits no discharge. Left eye exhibits no discharge. No scleral icterus.  Neck: Normal range of motion. Neck supple. No JVD present. Carotid bruit is not present.  Cardiovascular: Normal rate, regular rhythm and normal heart sounds.   Pulmonary/Chest: Effort normal and breath sounds normal.  Abdominal: Normal appearance. There is no splenomegaly or hepatomegaly.  Musculoskeletal: Normal range of motion.  Neurological: She is alert and oriented to person, place, and time.  Skin: Skin is warm, dry and intact. No rash noted. No pallor.  Psychiatric: She has a normal mood and  affect. Her behavior is normal. Judgment and thought content normal.      Assessment & Plan:   Problem List Items Addressed This Visit      Unprioritized   Chronic abdominal pain    F/u with GI.  Check ESR, CRP      Depression, major, single episode, severe (HCC)    Per psychiatry.  No suicidal ideation      Diabetes type 2, controlled (HCC)   Relevant Orders   Hgb A1c w/o eAG   Comprehensive metabolic panel   HTN (hypertension) (Chronic)    Stable, continue present medications.        Hyperlipidemia    Stable, continue present medications.        Lung nodule < 6cm on CT    3 cm.  No need for f/u low risk pt       Other Visit Diagnoses    Need for influenza vaccination    -  Primary   Relevant Orders   Flu Vaccine QUAD 36+  mos IM (Completed)   Galactorrhea in female       Relevant Orders   Prolactin   Thyroid Panel With TSH   Sed Rate (ESR)   C-reactive protein   Hospital discharge follow-up           Follow up plan: Return in about 3 months (around 11/11/2017).      

## 2017-08-12 NOTE — Patient Instructions (Signed)
Influenza (Flu) Vaccine (Inactivated or Recombinant): What You Need to Know 1. Why get vaccinated? Influenza ("flu") is a contagious disease that spreads around the [place] every year, usually between October and May. Flu is caused by influenza viruses, and is spread mainly by coughing, sneezing, and close contact. Anyone can get flu. Flu strikes suddenly and can last several days. Symptoms vary by age, but can include:  fever/chills  sore throat  muscle aches  fatigue  cough  headache  runny or stuffy nose  Flu can also lead to pneumonia and blood infections, and cause diarrhea and seizures in children. If you have a medical condition, such as heart or lung disease, flu can make it worse. Flu is more dangerous for some people. Infants and young children, people 23 years of age and older, pregnant women, and people with certain health conditions or a weakened immune system are at greatest risk. Each year thousands of people in the [place] die from flu, and many more are hospitalized. Flu vaccine can:  keep you from getting flu,  make flu less severe if you do get it, and  keep you from spreading flu to your family and other people. 2. Inactivated and recombinant flu vaccines A dose of flu vaccine is recommended every flu season. Children 6 months through 91 years of age may need two doses during the same flu season. Everyone else needs only one dose each flu season. Some inactivated flu vaccines contain a very small amount of a mercury-based preservative called thimerosal. Studies have not shown thimerosal in vaccines to be harmful, but flu vaccines that do not contain thimerosal are available. There is no live flu virus in flu shots. They cannot cause the flu. There are many flu viruses, and they are always changing. Each year a new flu vaccine is made to protect against three or four viruses that are likely to cause disease in the upcoming flu season. But even when the  vaccine doesn't exactly match these viruses, it may still provide some protection. Flu vaccine cannot prevent:  flu that is caused by a virus not covered by the vaccine, or  illnesses that look like flu but are not.  It takes about 2 weeks for protection to develop after vaccination, and protection lasts through the flu season. 3. Some people should not get this vaccine Tell the person who is giving you the vaccine:  If you have any severe, life-threatening allergies. If you ever had a life-threatening allergic reaction after a dose of flu vaccine, or have a severe allergy to any part of this vaccine, you may be advised not to get vaccinated. Most, but not all, types of flu vaccine contain a small amount of egg protein.  If you ever had Guillain-Barr Syndrome (also called GBS). Some people with a history of GBS should not get this vaccine. This should be discussed with your doctor.  If you are not feeling well. It is usually okay to get flu vaccine when you have a mild illness, but you might be asked to come back when you feel better.  4. Risks of a vaccine reaction With any medicine, including vaccines, there is a chance of reactions. These are usually mild and go away on their own, but serious reactions are also possible. Most people who get a flu shot do not have any problems with it. Minor problems following a flu shot include:  soreness, redness, or swelling where the shot was given  hoarseness  sore,  red or itchy eyes  cough  fever  aches  headache  itching  fatigue  If these problems occur, they usually begin soon after the shot and last 1 or 2 days. More serious problems following a flu shot can include the following:  There may be a small increased risk of Guillain-Barre Syndrome (GBS) after inactivated flu vaccine. This risk has been estimated at 1 or 2 additional cases per million people vaccinated. This is much lower than the risk of severe complications from  flu, which can be prevented by flu vaccine.  Young children who get the flu shot along with pneumococcal vaccine (PCV13) and/or DTaP vaccine at the same time might be slightly more likely to have a seizure caused by fever. Ask your doctor for more information. Tell your doctor if a child who is getting flu vaccine has ever had a seizure.  Problems that could happen after any injected vaccine:  People sometimes faint after a medical procedure, including vaccination. Sitting or lying down for about 15 minutes can help prevent fainting, and injuries caused by a fall. Tell your doctor if you feel dizzy, or have vision changes or ringing in the ears.  Some people get severe pain in the shoulder and have difficulty moving the arm where a shot was given. This happens very rarely.  Any medication can cause a severe allergic reaction. Such reactions from a vaccine are very rare, estimated at about 1 in a million doses, and would happen within a few minutes to a few hours after the vaccination. As with any medicine, there is a very remote chance of a vaccine causing a serious injury or death. The safety of vaccines is always being monitored. For more information, visit: http://www.aguilar.org/ 5. What if there is a serious reaction? What should I look for? Look for anything that concerns you, such as signs of a severe allergic reaction, very high fever, or unusual behavior. Signs of a severe allergic reaction can include hives, swelling of the face and throat, difficulty breathing, a fast heartbeat, dizziness, and weakness. These would start a few minutes to a few hours after the vaccination. What should I do?  If you think it is a severe allergic reaction or other emergency that can't wait, call 9-1-1 and get the person to the nearest hospital. Otherwise, call your doctor.  Reactions should be reported to the Vaccine Adverse Event Reporting System (VAERS). Your doctor should file this report, or you  can do it yourself through the VAERS web site at www.vaers.SamedayNews.es, or by calling 6094730752. ? VAERS does not give medical advice. 6. The National Vaccine Injury Compensation Program The [employer] Vaccine Injury Compensation Program (VICP) is a federal program that was created to compensate people who may have been injured by certain vaccines. Persons who believe they may have been injured by a vaccine can learn about the program and about filing a claim by calling 458-267-6070 or visiting the [place] website at GoldCloset.com.ee. There is a time limit to file a claim for compensation. 7. How can I learn more?  Ask your healthcare provider. He or she can give you the vaccine package insert or suggest other sources of information.  Call your local or state health department.  Contact the Centers for Disease Control and Prevention (CDC): ? Call (540)164-9661 (1-800-CDC-INFO) or ? Visit CDC's website at https://gibson.com/ Vaccine Information Statement, Inactivated Influenza Vaccine (07/15/2014) This information is not intended to replace advice given to you by your health care provider. Make sure  you discuss any questions you have with your health care provider. Document Released: 09/19/2006 Document Revised: 08/15/2016 Document Reviewed: 08/15/2016 Elsevier Interactive Patient Education  2017 [place]. Influenza (Flu) Vaccine (Inactivated or Recombinant): What You Need to Know 1. Why get vaccinated? Influenza ("flu") is a contagious disease that spreads around the [place] every year, usually between October and May. Flu is caused by influenza viruses, and is spread mainly by coughing, sneezing, and close contact. Anyone can get flu. Flu strikes suddenly and can last several days. Symptoms vary by age, but can include:  fever/chills  sore throat  muscle aches  fatigue  cough  headache  runny or stuffy nose  Flu can also lead to pneumonia and blood  infections, and cause diarrhea and seizures in children. If you have a medical condition, such as heart or lung disease, flu can make it worse. Flu is more dangerous for some people. Infants and young children, people 50 years of age and older, pregnant women, and people with certain health conditions or a weakened immune system are at greatest risk. Each year thousands of people in the [place] die from flu, and many more are hospitalized. Flu vaccine can:  keep you from getting flu,  make flu less severe if you do get it, and  keep you from spreading flu to your family and other people. 2. Inactivated and recombinant flu vaccines A dose of flu vaccine is recommended every flu season. Children 6 months through 78 years of age may need two doses during the same flu season. Everyone else needs only one dose each flu season. Some inactivated flu vaccines contain a very small amount of a mercury-based preservative called thimerosal. Studies have not shown thimerosal in vaccines to be harmful, but flu vaccines that do not contain thimerosal are available. There is no live flu virus in flu shots. They cannot cause the flu. There are many flu viruses, and they are always changing. Each year a new flu vaccine is made to protect against three or four viruses that are likely to cause disease in the upcoming flu season. But even when the vaccine doesn't exactly match these viruses, it may still provide some protection. Flu vaccine cannot prevent:  flu that is caused by a virus not covered by the vaccine, or  illnesses that look like flu but are not.  It takes about 2 weeks for protection to develop after vaccination, and protection lasts through the flu season. 3. Some people should not get this vaccine Tell the person who is giving you the vaccine:  If you have any severe, life-threatening allergies. If you ever had a life-threatening allergic reaction after a dose of flu vaccine, or have a  severe allergy to any part of this vaccine, you may be advised not to get vaccinated. Most, but not all, types of flu vaccine contain a small amount of egg protein.  If you ever had Guillain-Barr Syndrome (also called GBS). Some people with a history of GBS should not get this vaccine. This should be discussed with your doctor.  If you are not feeling well. It is usually okay to get flu vaccine when you have a mild illness, but you might be asked to come back when you feel better.  4. Risks of a vaccine reaction With any medicine, including vaccines, there is a chance of reactions. These are usually mild and go away on their own, but serious reactions are also possible. Most people who get a flu  shot do not have any problems with it. Minor problems following a flu shot include:  soreness, redness, or swelling where the shot was given  hoarseness  sore, red or itchy eyes  cough  fever  aches  headache  itching  fatigue  If these problems occur, they usually begin soon after the shot and last 1 or 2 days. More serious problems following a flu shot can include the following:  There may be a small increased risk of Guillain-Barre Syndrome (GBS) after inactivated flu vaccine. This risk has been estimated at 1 or 2 additional cases per million people vaccinated. This is much lower than the risk of severe complications from flu, which can be prevented by flu vaccine.  Young children who get the flu shot along with pneumococcal vaccine (PCV13) and/or DTaP vaccine at the same time might be slightly more likely to have a seizure caused by fever. Ask your doctor for more information. Tell your doctor if a child who is getting flu vaccine has ever had a seizure.  Problems that could happen after any injected vaccine:  People sometimes faint after a medical procedure, including vaccination. Sitting or lying down for about 15 minutes can help prevent fainting, and injuries caused by a fall.  Tell your doctor if you feel dizzy, or have vision changes or ringing in the ears.  Some people get severe pain in the shoulder and have difficulty moving the arm where a shot was given. This happens very rarely.  Any medication can cause a severe allergic reaction. Such reactions from a vaccine are very rare, estimated at about 1 in a million doses, and would happen within a few minutes to a few hours after the vaccination. As with any medicine, there is a very remote chance of a vaccine causing a serious injury or death. The safety of vaccines is always being monitored. For more information, visit: http://www.aguilar.org/ 5. What if there is a serious reaction? What should I look for? Look for anything that concerns you, such as signs of a severe allergic reaction, very high fever, or unusual behavior. Signs of a severe allergic reaction can include hives, swelling of the face and throat, difficulty breathing, a fast heartbeat, dizziness, and weakness. These would start a few minutes to a few hours after the vaccination. What should I do?  If you think it is a severe allergic reaction or other emergency that can't wait, call 9-1-1 and get the person to the nearest hospital. Otherwise, call your doctor.  Reactions should be reported to the Vaccine Adverse Event Reporting System (VAERS). Your doctor should file this report, or you can do it yourself through the VAERS web site at www.vaers.SamedayNews.es, or by calling (302)776-9527. ? VAERS does not give medical advice. 6. The National Vaccine Injury Compensation Program The [employer] Vaccine Injury Compensation Program (VICP) is a federal program that was created to compensate people who may have been injured by certain vaccines. Persons who believe they may have been injured by a vaccine can learn about the program and about filing a claim by calling 213-796-0350 or visiting the [place] website at GoldCloset.com.ee. There is a time  limit to file a claim for compensation. 7. How can I learn more?  Ask your healthcare provider. He or she can give you the vaccine package insert or suggest other sources of information.  Call your local or state health department.  Contact the Centers for Disease Control and Prevention (CDC): ? Call 678-221-8021 (1-800-CDC-INFO) or ? Visit  CDC's website at https://gibson.com/ Vaccine Information Statement, Inactivated Influenza Vaccine (07/15/2014) This information is not intended to replace advice given to you by your health care provider. Make sure you discuss any questions you have with your health care provider. Document Released: 09/19/2006 Document Revised: 08/15/2016 Document Reviewed: 08/15/2016 Elsevier Interactive Patient Education  2017 [employer].

## 2017-08-12 NOTE — Assessment & Plan Note (Signed)
F/u with GI.  Check ESR, CRP

## 2017-08-12 NOTE — Assessment & Plan Note (Signed)
Per psychiatry.  No suicidal ideation

## 2017-08-12 NOTE — Assessment & Plan Note (Signed)
3 cm.  No need for f/u low risk pt

## 2017-08-13 ENCOUNTER — Encounter: Payer: Self-pay | Admitting: Unknown Physician Specialty

## 2017-08-13 ENCOUNTER — Telehealth: Payer: Self-pay | Admitting: Unknown Physician Specialty

## 2017-08-13 DIAGNOSIS — R300 Dysuria: Secondary | ICD-10-CM

## 2017-08-13 LAB — COMPREHENSIVE METABOLIC PANEL
ALT: 18 IU/L (ref 0–32)
AST: 12 IU/L (ref 0–40)
Albumin/Globulin Ratio: 1 — ABNORMAL LOW (ref 1.2–2.2)
Albumin: 3.3 g/dL — ABNORMAL LOW (ref 3.5–5.5)
Alkaline Phosphatase: 123 IU/L — ABNORMAL HIGH (ref 39–117)
BUN/Creatinine Ratio: 14 (ref 9–23)
BUN: 18 mg/dL (ref 6–20)
Bilirubin Total: <0.2 mg/dL (ref 0.0–1.2)
CO2: 20 mmol/L (ref 20–29)
Calcium: 8.9 mg/dL (ref 8.7–10.2)
Chloride: 102 mmol/L (ref 96–106)
Creatinine, Ser: 1.32 mg/dL — ABNORMAL HIGH (ref 0.57–1.00)
GFR calc Af Amer: 59 mL/min/{1.73_m2} — ABNORMAL LOW (ref >59)
GFR calc non Af Amer: 52 mL/min/{1.73_m2} — ABNORMAL LOW (ref >59)
Globulin, Total: 3.3 g/dL (ref 1.5–4.5)
Glucose: 285 mg/dL — ABNORMAL HIGH (ref 65–99)
Potassium: 4.3 mmol/L (ref 3.5–5.2)
Sodium: 140 mmol/L (ref 134–144)
Total Protein: 6.6 g/dL (ref 6.0–8.5)

## 2017-08-13 LAB — THYROID PANEL WITH TSH
Free Thyroxine Index: 1.5 (ref 1.2–4.9)
T3 Uptake Ratio: 24 % (ref 24–39)
T4, Total: 6.1 ug/dL (ref 4.5–12.0)
TSH: 2.22 u[IU]/mL (ref 0.450–4.500)

## 2017-08-13 LAB — C-REACTIVE PROTEIN: CRP: 26.2 mg/L — ABNORMAL HIGH (ref 0.0–4.9)

## 2017-08-13 LAB — HGB A1C W/O EAG: Hgb A1c MFr Bld: 11.9 % — ABNORMAL HIGH (ref 4.8–5.6)

## 2017-08-13 LAB — SEDIMENTATION RATE: Sed Rate: 38 mm/hr — ABNORMAL HIGH (ref 0–32)

## 2017-08-13 LAB — PROLACTIN: Prolactin: 27.6 ng/mL — ABNORMAL HIGH (ref 4.8–23.3)

## 2017-08-13 NOTE — Telephone Encounter (Signed)
Unable to reach.  Will send a message on mychart to schedule a visit asap for improved DM control

## 2017-08-18 ENCOUNTER — Encounter: Payer: Self-pay | Admitting: Family

## 2017-08-18 ENCOUNTER — Ambulatory Visit: Payer: Medicaid Other | Attending: Family | Admitting: Family

## 2017-08-18 VITALS — BP 150/98 | HR 106 | Resp 18 | Ht 64.0 in | Wt 235.1 lb

## 2017-08-18 DIAGNOSIS — I110 Hypertensive heart disease with heart failure: Secondary | ICD-10-CM | POA: Insufficient documentation

## 2017-08-18 DIAGNOSIS — I252 Old myocardial infarction: Secondary | ICD-10-CM | POA: Diagnosis not present

## 2017-08-18 DIAGNOSIS — E119 Type 2 diabetes mellitus without complications: Secondary | ICD-10-CM | POA: Insufficient documentation

## 2017-08-18 DIAGNOSIS — Z87891 Personal history of nicotine dependence: Secondary | ICD-10-CM | POA: Insufficient documentation

## 2017-08-18 DIAGNOSIS — I5032 Chronic diastolic (congestive) heart failure: Secondary | ICD-10-CM | POA: Diagnosis present

## 2017-08-18 DIAGNOSIS — D649 Anemia, unspecified: Secondary | ICD-10-CM | POA: Insufficient documentation

## 2017-08-18 DIAGNOSIS — I10 Essential (primary) hypertension: Secondary | ICD-10-CM

## 2017-08-18 DIAGNOSIS — Z7982 Long term (current) use of aspirin: Secondary | ICD-10-CM | POA: Diagnosis not present

## 2017-08-18 DIAGNOSIS — E785 Hyperlipidemia, unspecified: Secondary | ICD-10-CM | POA: Diagnosis not present

## 2017-08-18 DIAGNOSIS — Z79899 Other long term (current) drug therapy: Secondary | ICD-10-CM | POA: Diagnosis not present

## 2017-08-18 DIAGNOSIS — N289 Disorder of kidney and ureter, unspecified: Secondary | ICD-10-CM | POA: Diagnosis not present

## 2017-08-18 MED ORDER — METOPROLOL TARTRATE 50 MG PO TABS: ORAL_TABLET | ORAL | 0 refills | Status: DC

## 2017-08-18 NOTE — Patient Instructions (Signed)
Continue weighing daily and call for an overnight weight gain of > 2 pounds or a weekly weight gain of >5 pounds.  Increase morning dose of metoprolol to 100mg  and continue the afternoon dose at 50mg .

## 2017-08-18 NOTE — Progress Notes (Addendum)
Agree with pharmacist note below.  Physical exam and ROS done by myself.  Due to elevated BP and HR, will increase her metoprolol to 100mg  AM and keep the PM dose at 50mg  right now. She was encouraged to check her BP and HR at home and let me know if it drops too much where her dizziness is worse.  Sees cardiologist 09/15/17 and her PCP December 2018 so we will see her in November 2018 or sooner for questions/problems before then.  [name], FNP HF Clinic at [facility]    Subjective:    Patient ID: [name] [name], female    DOB: Apr 17, 1981, 36 y.o.   MRN: [id number]  HPI  [name] [name] is a 36 y/o female with a history of diabetes, hyperlipidemia, HTN, anemia, renal insufficiency, MI and heart failure with preserved ejection fraction.   Reviewed echo report from 09/26/16 which showed an EF of 55-60% along with mild MR. PA pressure was in the normal range.   Most recent hospital admission on 08/03/17 for upper abdominal pain with unclear etiology. Evaluated and advised to follow-up with outpatient gastroenterologist. Was in the ED 04/23/17 due to a mechanical fall. Treated and released. Was in the ED 03/11/17 with abdominal pain and diarrhea. Evaluated and discharged home. Was in the ED on 12/08/16 for dizziness and dehydration. Was given IV hydration along with amoxicillen for a dental infection. Previous ED visit was 10/24/16 for chest pain. Troponins were negative and she was discharged home with cardiology follow-up. Was admitted 08/05/16 with acute appendicitis. She had a lap appendectomy done on 08/07/16 and was discharged on 08/08/16. She presented to [facility] in April of 2016 with NSTEMI.   Has had hypotension for which losartan had to be stopped.   She returns today for a follow-up visit with a chief complaint of mild shortness of breath, lightheadedness and sweatiness with moderate exertion. She describes it as intermittent in nature but has been present for several years with varying levels of  severity. In addition, patient still experiencing upper abdominal pain from which she was hospitalized for recently and states she was told it was a possible muscle pull.   Past Medical History:  Diagnosis Date  . Chronic systolic CHF (congestive heart failure) ([place])    a. echo 03/2015: EF 30-35%, mild concentric LVH, severe HK of inf, inflat, & lat walls, mod MR, mild TR;  b. 04/2015 LV Gram: EF 55-65%.  . Diabetes type 2, controlled ([place])    a. since 2002   . Heavy menses    a. H/O IUD - expired in 2014 - remains in place.  . Hyperlipidemia   . Hypertension   . Iron deficiency anemia   . Ischemic cardiomyopathy    a. 03/2015 EF 30-35% post NSTEMI;  b. 04/2015 EF 55-65% on LV gram.  . Kidney disease   . MI (myocardial infarction) ([place]) 03/29/2015  . Obesity   . Renal insufficiency   . Tobacco abuse    a. quit 03/2015.  Marland Kitchen Uterine fibroid   . Vertigo   . Vitamin D deficiency    Past Surgical History:  Procedure Laterality Date  . APPENDECTOMY    . CARDIAC CATHETERIZATION  4/16   x2 stent [facility]  . CARDIAC CATHETERIZATION N/A 05/05/2015   Procedure: Left Heart Cath and Coronary Angiography;  Surgeon: Peter M Martinique, MD;  Location: [place] CV LAB;  Service: Cardiovascular;  Laterality: N/A;  . COLONOSCOPY WITH PROPOFOL N/A 07/31/2016   Procedure: COLONOSCOPY WITH PROPOFOL;  Surgeon: Lollie Sails, MD;  Location: [facility] ENDOSCOPY;  Service: Endoscopy;  Laterality: N/A;  . ESOPHAGOGASTRODUODENOSCOPY (EGD) WITH PROPOFOL N/A 07/31/2016   Procedure: ESOPHAGOGASTRODUODENOSCOPY (EGD) WITH PROPOFOL;  Surgeon: Lollie Sails, MD;  Location: [facility] ENDOSCOPY;  Service: Endoscopy;  Laterality: N/A;  . LAPAROSCOPIC APPENDECTOMY N/A 08/07/2016   Procedure: APPENDECTOMY LAPAROSCOPIC;  Surgeon: Hubbard Robinson, MD;  Location: [facility] ORS;  Service: General;  Laterality: N/A;  . MOUTH SURGERY     Family History  Problem Relation Age of Onset  . Diabetes Father   . Cancer Maternal Grandmother         lung  . Cancer Maternal Grandfather        prostate  . Diabetes Paternal Grandfather   . Cancer - Cervical Maternal Aunt    Social History  Substance Use Topics  . Smoking status: Former Smoker    Years: 15.00    Quit date: 03/10/2015  . Smokeless tobacco: Never Used  . Alcohol use No   Allergies  Allergen Reactions  . Lisinopril Anaphylaxis, Itching and Swelling  . Rosemary Oil Anaphylaxis  . Shellfish Allergy Itching and Swelling  . Metformin And Related Diarrhea, Nausea And Vomiting and Rash   Prior to Admission medications   Medication Sig Start Date End Date Taking? Authorizing Provider  albuterol (PROVENTIL HFA;VENTOLIN HFA) 108 (90 Base) MCG/ACT inhaler Inhale 2 puffs into the lungs every 6 (six) hours as needed for wheezing or shortness of breath. 02/19/17  Yes Volney American, PA-C  amitriptyline (ELAVIL) 10 MG tablet Take 1-2 tablets (10-20 mg total) by mouth at bedtime. Take 2 tablets at bedtime. Total of 20mg  nightly Patient taking differently: Take 20 mg by mouth at bedtime.  05/15/17  Yes Armbruster, Renelda Loma, MD  aspirin EC 81 MG tablet Take 81 mg by mouth daily.   Yes [provider]  benzonatate (TESSALON) 100 MG capsule Take 1 capsule (100 mg total) by mouth 2 (two) times daily as needed for cough. 12/20/16  Yes Johnson, Megan P, DO  Dulaglutide (TRULICITY) 8.31 DV/7.6HY SOPN Inject 0.75 mg into the skin once a week. 02/10/17  Yes Kathrine Haddock, NP  etonogestrel (NEXPLANON) 68 MG IMPL implant 1 each by Subdermal route once.   Yes [provider]  ferrous sulfate 325 (65 FE) MG tablet TAKE ONE TABLET BY MOUTH THREE TIMES DAILY WITH MEALS 03/03/17  Yes Kathrine Haddock, NP  glimepiride (AMARYL) 4 MG tablet TAKE ONE TABLET BY MOUTH ONCE DAILY 05/02/17  Yes Wynetta Emery, Megan P, DO  glucose blood (ACCU-CHEK ACTIVE STRIPS) test strip Use as instructed 03/25/16  Yes Kathrine Haddock, NP  isosorbide mononitrate (IMDUR) 30 MG 24 hr tablet TAKE ONE TABLET BY  MOUTH ONCE DAILY 04/30/17  Yes Wellington Hampshire, MD  JANUVIA 100 MG tablet TAKE ONE TABLET BY MOUTH ONCE DAILY 02/07/17  Yes Johnson, Megan P, DO  loperamide (IMODIUM) 2 MG capsule Take 2 mg by mouth as needed for diarrhea or loose stools.   Yes [provider]  meclizine (ANTIVERT) 32 MG tablet Take 1 tablet (32 mg total) by mouth 3 (three) times daily as needed. Patient taking differently: Take 32 mg by mouth 3 (three) times daily as needed for dizziness or nausea.  06/21/16  Yes Kathrine Haddock, NP  metoprolol (LOPRESSOR) 50 MG tablet Take 1 tablet (50 mg total) by mouth 2 (two) times daily. 03/21/17 06/19/17 Yes Wellington Hampshire, MD  naproxen (NAPROSYN) 375 MG tablet Take 1 tablet (375 mg total) by  mouth 2 (two) times daily with a meal. 04/23/17 04/23/18 Yes McShane, Gerda Diss, MD  nitrofurantoin, macrocrystal-monohydrate, (MACROBID) 100 MG capsule Take 1 capsule (100 mg total) by mouth 2 (two) times daily. 04/30/17  Yes Johnson, Megan P, DO  nitroGLYCERIN (NITROSTAT) 0.4 MG SL tablet Place 1 tablet (0.4 mg total) under the tongue every 5 (five) minutes as needed for chest pain. Reported on 02/20/2016 03/17/17  Yes [name] A, FNP  ondansetron (ZOFRAN ODT) 8 MG disintegrating tablet Take 1 tablet (8 mg total) by mouth every 8 (eight) hours as needed for nausea or vomiting. 05/15/17  Yes Armbruster, Renelda Loma, MD  pantoprazole (PROTONIX) 40 MG tablet TAKE ONE TABLET BY MOUTH ONCE DAILY AT 6 AM. 04/28/17  Yes Volney American, PA-C  rifaximin (XIFAXAN) 550 MG TABS tablet Take 550 mg by mouth 3 (three) times daily.   Yes [provider]  rosuvastatin (CRESTOR) 5 MG tablet Take 1 tablet (5 mg total) by mouth every other day. 12/11/16 05/16/17 Yes Wende Bushy, MD  venlafaxine XR (EFFEXOR-XR) 75 MG 24 hr capsule Take 75 mg by mouth 2 (two) times daily.    Yes [provider]  Vitamin D, Ergocalciferol, (DRISDOL) 50000 units CAPS capsule Take 1 capsule (50,000 Units total) by mouth  every 7 (seven) days. 12/27/16   Kathrine Haddock, NP    Review of Systems  Constitutional: Positive for appetite change (due to nausea and feeling full quickly) and fatigue.  HENT: Negative for congestion and postnasal drip.   Eyes: Negative.   Respiratory: Positive for shortness of breath. Negative for cough and chest tightness.   Cardiovascular: Positive for chest pain ("improving") and palpitations (at times). Negative for leg swelling.  Gastrointestinal: Positive for abdominal pain (upper abdomen hurts) and nausea. Negative for abdominal distention.  Endocrine: Negative.   Genitourinary: Negative.   Musculoskeletal: Negative for back pain.  Skin: Negative.   Allergic/Immunologic: Negative.   Neurological: Positive for light-headedness. Negative for dizziness.  Hematological: Negative for adenopathy. Does not bruise/bleed easily.  Psychiatric/Behavioral: Negative for dysphoric mood, sleep disturbance (wearing CPAP nightly) and suicidal ideas. The patient is nervous/anxious (at times).    Vitals:   08/18/17 0915 08/18/17 0942  BP: (!) 150/105 (!) 150/98  Pulse: (!) 106   Resp: 18   Weight: 235 lb 2 oz (106.7 kg)   Height: 5\' 4"  (1.626 m)    Wt Readings from Last 3 Encounters:  08/18/17 235 lb 2 oz (106.7 kg)  08/12/17 231 lb 9.6 oz (105.1 kg)  08/03/17 231 lb 8 oz (105 kg)    Lab Results  Component Value Date   CREATININE 1.32 (H) 08/12/2017   CREATININE 1.17 (H) 08/04/2017   CREATININE 1.45 (H) 08/03/2017      Objective:   Physical Exam  Constitutional: She is oriented to person, place, and time. She appears well-developed and well-nourished.  HENT:  Head: Normocephalic and atraumatic.  Neck: Normal range of motion. Neck supple. No JVD present.  Cardiovascular: Regular rhythm.  Tachycardia present.   Pulmonary/Chest: Effort normal. She has no wheezes. She has no rales.  Abdominal: Soft. She exhibits no distension. There is no tenderness.  Musculoskeletal: She  exhibits no edema or tenderness.  Neurological: She is alert and oriented to person, place, and time.  Skin: Skin is warm and dry.  Psychiatric: She has a normal mood and affect. Her behavior is normal. Thought content normal.  Nursing note and vitals reviewed.     Assessment & Plan:  1: Chronic heart failure with preserved ejection fraction- - NYHA Class II -Euvolemic today -Weight is stable. Reminded to call for an overnight weight gain of >2 pounds or a weekly weight gain of >5 pounds.  - Hasn't been doing much walking/exercising due to feeling lightheaded and dizzy/sweaty; would like to resume walking but patient states dizziness/lightheadedness has been going on for ~2 years now.  - Continues to not add salt to food; reminded to keep sodium intake to 2000mg  daily - saw her cardiologist Fletcher Anon) on 04/22/17  2: HTN- - Blood pressure higher than normal today, 150/105 (rechecked 150/98)  - Discussed daily BP monitoring at home.  - Today, NP increased metoprolol to 100mg  in the AM and 50mg  in PM.  - Saw her PCP Julian Hy) on 08/12/17  3: Diabetes-  - Glucose (nonfasting) yesterday, 1 hour after meal, was 285. - Checks BG 3-4 times a day.  - Admits to occasional BG lows in the 50's. Reviewed symptoms of low BG and what to do when experiencing a low.  - Last A1c on 09/02/16 was 6.9% - Recommended possibly stopping glimepiride in the future due to dizziness and lightheaded episodes over past 2 years.  - Suspect gastroparesis; Followed by GI provider    Patient did not bring her medications nor a list. Each medication was verbally reviewed with the patient.    Return 10/20/17 or sooner for any questions/problems before then.   Candelaria Stagers, PharmD Pharmacy Resident  08/18/17

## 2017-08-27 ENCOUNTER — Encounter: Payer: Self-pay | Admitting: Unknown Physician Specialty

## 2017-08-27 ENCOUNTER — Ambulatory Visit (INDEPENDENT_AMBULATORY_CARE_PROVIDER_SITE_OTHER): Payer: Medicaid Other | Admitting: Unknown Physician Specialty

## 2017-08-27 DIAGNOSIS — E1165 Type 2 diabetes mellitus with hyperglycemia: Secondary | ICD-10-CM | POA: Diagnosis not present

## 2017-08-27 MED ORDER — INSULIN PEN NEEDLE 32G X 4 MM MISC: 1.0000 [IU] | Freq: Every morning | 3 refills | Status: DC

## 2017-08-27 MED ORDER — INSULIN GLARGINE-LIXISENATIDE 100-33 UNT-MCG/ML ~~LOC~~ SOPN: 15.0000 [IU] | PEN_INJECTOR | Freq: Every day | SUBCUTANEOUS | 12 refills | Status: DC

## 2017-08-27 NOTE — Patient Instructions (Signed)
Titrate the dosage upwards or downwards by 4 units every week until the desired fasting plasma glucose is achieved (120); usual dosage range: 15 units (insulin glargine 15 units/lixisenatide 5 mcg) to 60 units (insulin glargine 60 units/lixisenatide 20 mcg)/day.

## 2017-08-27 NOTE — Progress Notes (Signed)
BP 139/89 (BP Location: Left Arm, Cuff Size: Large)   Pulse (!) 103   Temp 98.6 F (37 C)   Wt 230 lb 6.4 oz (104.5 kg)   SpO2 96%   BMI 39.55 kg/m    Subjective:    Patient ID: [name] [name], female    DOB: [birth year], 36 y.o.   MRN: [id number]  HPI: [name] [name] is a 36 y.o. female  Chief Complaint  Patient presents with  . Blood Sugar Problem    per Malachy Mood   Diabetes Noted Hgb A1C very high last visit and need to discuss treatment.  I suspect fatigue and joint pain is related to he poorly controlled blood sugar.  She is checking her blood sugar and it was 300 yesterday.  Pt was on Lantus at one time but ran out and never got it refilled.    Relevant past medical, surgical, family and social history reviewed and updated as indicated. Interim medical history since our last visit reviewed. Allergies and medications reviewed and updated.  Review of Systems  Per HPI unless specifically indicated above     Objective:    BP 139/89 (BP Location: Left Arm, Cuff Size: Large)   Pulse (!) 103   Temp 98.6 F (37 C)   Wt 230 lb 6.4 oz (104.5 kg)   SpO2 96%   BMI 39.55 kg/m   Wt Readings from Last 3 Encounters:  08/27/17 230 lb 6.4 oz (104.5 kg)  08/18/17 235 lb 2 oz (106.7 kg)  08/12/17 231 lb 9.6 oz (105.1 kg)    Physical Exam  Constitutional: She is oriented to person, place, and time. She appears well-developed and well-nourished. No distress.  HENT:  Head: Normocephalic and atraumatic.  Eyes: Conjunctivae and lids are normal. Right eye exhibits no discharge. Left eye exhibits no discharge. No scleral icterus.  Neck: Normal range of motion. Neck supple. No JVD present. Carotid bruit is not present.  Cardiovascular: Normal rate, regular rhythm and normal heart sounds.   Pulmonary/Chest: Effort normal and breath sounds normal.  Abdominal: Normal appearance. There is no splenomegaly or hepatomegaly.  Musculoskeletal: Normal range of motion.  Neurological: She is  alert and oriented to person, place, and time.  Skin: Skin is warm, dry and intact. No rash noted. No pallor.  Psychiatric: She has a normal mood and affect. Her behavior is normal. Judgment and thought content normal.    Results for orders placed or performed in visit on 08/12/17  Prolactin  Result Value Ref Range   Prolactin 27.6 (H) 4.8 - 23.3 ng/mL  Thyroid Panel With TSH  Result Value Ref Range   TSH 2.220 0.450 - 4.500 uIU/mL   T4, Total 6.1 4.5 - 12.0 ug/dL   T3 Uptake Ratio 24 24 - 39 %   Free Thyroxine Index 1.5 1.2 - 4.9  Sed Rate (ESR)  Result Value Ref Range   Sed Rate 38 (H) 0 - 32 mm/hr  C-reactive protein  Result Value Ref Range   CRP 26.2 (H) 0.0 - 4.9 mg/L  Hgb A1c w/o eAG  Result Value Ref Range   Hgb A1c MFr Bld 11.9 (H) 4.8 - 5.6 %  Comprehensive metabolic panel  Result Value Ref Range   Glucose 285 (H) 65 - 99 mg/dL   BUN 18 6 - 20 mg/dL   Creatinine, Ser 1.32 (H) 0.57 - 1.00 mg/dL   GFR calc non Af Amer 52 (L) >59 mL/min/1.73   GFR calc Af Amer 59 (L) >59  mL/min/1.73   BUN/Creatinine Ratio 14 9 - 23   Sodium 140 134 - 144 mmol/L   Potassium 4.3 3.5 - 5.2 mmol/L   Chloride 102 96 - 106 mmol/L   CO2 20 20 - 29 mmol/L   Calcium 8.9 8.7 - 10.2 mg/dL   Total Protein 6.6 6.0 - 8.5 g/dL   Albumin 3.3 (L) 3.5 - 5.5 g/dL   Globulin, Total 3.3 1.5 - 4.5 g/dL   Albumin/Globulin Ratio 1.0 (L) 1.2 - 2.2   Bilirubin Total <0.2 0.0 - 1.2 mg/dL   Alkaline Phosphatase 123 (H) 39 - 117 IU/L   AST 12 0 - 40 IU/L   ALT 18 0 - 32 IU/L      Assessment & Plan:   Problem List Items Addressed This Visit      Unprioritized   Poorly controlled type 2 diabetes mellitus ([place])    Worsening control.  Stop Trulicity and Januvia.  Start Soliqua 15 u with written instructions to increase 4 units every week for FBS greater than 120      Relevant Medications   Insulin Glargine-Lixisenatide (SOLIQUA) 100-33 UNT-MCG/ML SOPN       Follow up plan: Return in about 4 weeks  (around 09/24/2017).

## 2017-08-27 NOTE — Assessment & Plan Note (Addendum)
Worsening control.  Stop Trulicity and Januvia.  Start [place] 15 u with written instructions to increase 4 units every week for FBS greater than 120

## 2017-09-01 ENCOUNTER — Encounter: Payer: Self-pay | Admitting: Cardiovascular Disease

## 2017-09-01 ENCOUNTER — Ambulatory Visit (INDEPENDENT_AMBULATORY_CARE_PROVIDER_SITE_OTHER): Payer: Medicaid Other | Admitting: Cardiovascular Disease

## 2017-09-01 ENCOUNTER — Telehealth: Payer: Self-pay | Admitting: Cardiovascular Disease

## 2017-09-01 VITALS — BP 134/86 | HR 88 | Ht 65.0 in | Wt 234.0 lb

## 2017-09-01 DIAGNOSIS — I25119 Atherosclerotic heart disease of native coronary artery with unspecified angina pectoris: Secondary | ICD-10-CM

## 2017-09-01 DIAGNOSIS — R079 Chest pain, unspecified: Secondary | ICD-10-CM | POA: Diagnosis not present

## 2017-09-01 DIAGNOSIS — I5022 Chronic systolic (congestive) heart failure: Secondary | ICD-10-CM | POA: Diagnosis not present

## 2017-09-01 DIAGNOSIS — E785 Hyperlipidemia, unspecified: Secondary | ICD-10-CM

## 2017-09-01 DIAGNOSIS — I10 Essential (primary) hypertension: Secondary | ICD-10-CM | POA: Diagnosis not present

## 2017-09-01 DIAGNOSIS — R0602 Shortness of breath: Secondary | ICD-10-CM | POA: Diagnosis not present

## 2017-09-01 NOTE — Telephone Encounter (Addendum)
Pt reports occasional stabbing 7 out of 10 chest pain for one week. Pain occurs during the day and often wakes her at night and is sometimes accompanied by left arm and jaw pain and nausea. Lasts 5 -10 minutes. Takes  2-3 nitroglycerin and sx resolve.  She also takes imdur 30mg . VS this morning: BP 125/82 HR 113 States she is always dizzy Last episode of chest pain last night.  She is agreeable to 3:40pm appt today with Dr. Fletcher Anon but understands if sx return and are unresolved, she should proceed to the ER.

## 2017-09-01 NOTE — Telephone Encounter (Signed)
Received MyChart message from patient reporting sharp, stabbing chest pains. I tried to call the patient. No answer. Left message on pt's VM to contact the office.

## 2017-09-01 NOTE — Progress Notes (Signed)
Cardiology Office Note   Date:  09/01/2017   ID:  [name], [name] 08/03/1981, MRN [id number]  PCP:  Kathrine Haddock, NP  Cardiologist:   Kathlyn Sacramento, MD   Chief Complaint  Patient presents with  . Other    Chest pain radiates left arm pt has used Nitro. Meds reviewed verbally with pt.      History of Present Illness: [name] [name] is a 36 y.o. female who presents for a follow-up visit regarding coronary artery disease and chronic systolic heart failure. She has prolonged history of type 2 diabetes since 2002 , prolonged history of tobacco use, and obesity.  She presented to [facility] in April of 2016 with NSTEMI. Echocardiogram showed an ejection fraction of 30% with severe inferior, inferolateral and lateral wall hypokinesis with moderate mitral regurgitation. Cardiac catheterization showed occluded mid left circumflex with significant disease affecting the proximal OM1. There was mild disease affecting the LAD and minor irregularities in the RCA. Ejection fraction was 35%. I performed successful angioplasty and drug-eluting stent placement to the mid LCX and proximal OM . She underwent a repeat cardiac catheterization in May 2016 which showed patent stents in the left circumflex and OM1. Ejection fraction was 50-55%.  Most recent echocardiogram in October 2017 showed normal LV systolic function with an EF of 55-60%. She had sinus tachycardia that improved after switching carvedilol to metoprolol.  She reports recent episode of chest pain described as sharp stabbing discomfort which happens at rest and frequently wakes her up from sleep. She does complain of increased burning sensation and heartburn symptoms. She was hospitalized recently at [facility] with abdominal pain with negative CT scan of the abdomen. She is supposed to follow-up with GI in the near future.   Past Medical History:  Diagnosis Date  . Chronic systolic CHF (congestive heart failure) ([place])    a. echo 03/2015: EF  30-35%, mild concentric LVH, severe HK of inf, inflat, & lat walls, mod MR, mild TR;  b. 04/2015 LV Gram: EF 55-65%.  . Diabetes type 2, controlled ([place])    a. since 2002   . Heavy menses    a. H/O IUD - expired in 2014 - remains in place.  . Hyperlipidemia   . Hypertension   . Iron deficiency anemia   . Ischemic cardiomyopathy    a. 03/2015 EF 30-35% post NSTEMI;  b. 04/2015 EF 55-65% on LV gram.  . Kidney disease   . MI (myocardial infarction) ([place]) 03/29/2015  . Obesity   . Renal insufficiency   . Tobacco abuse    a. quit 03/2015.  Marland Kitchen Uterine fibroid   . Vertigo   . Vitamin D deficiency     Past Surgical History:  Procedure Laterality Date  . APPENDECTOMY    . CARDIAC CATHETERIZATION  4/16   x2 stent [facility]  . CARDIAC CATHETERIZATION N/A 05/05/2015   Procedure: Left Heart Cath and Coronary Angiography;  Surgeon: Peter M Martinique, MD;  Location: [place] CV LAB;  Service: Cardiovascular;  Laterality: N/A;  . COLONOSCOPY WITH PROPOFOL N/A 07/31/2016   Procedure: COLONOSCOPY WITH PROPOFOL;  Surgeon: Lollie Sails, MD;  Location: [facility] ENDOSCOPY;  Service: Endoscopy;  Laterality: N/A;  . ESOPHAGOGASTRODUODENOSCOPY (EGD) WITH PROPOFOL N/A 07/31/2016   Procedure: ESOPHAGOGASTRODUODENOSCOPY (EGD) WITH PROPOFOL;  Surgeon: Lollie Sails, MD;  Location: [facility] ENDOSCOPY;  Service: Endoscopy;  Laterality: N/A;  . LAPAROSCOPIC APPENDECTOMY N/A 08/07/2016   Procedure: APPENDECTOMY LAPAROSCOPIC;  Surgeon: Hubbard Robinson, MD;  Location: [facility] ORS;  Service: General;  Laterality: N/A;  . MOUTH SURGERY       Current Outpatient Prescriptions  Medication Sig Dispense Refill  . albuterol (PROVENTIL HFA;VENTOLIN HFA) 108 (90 Base) MCG/ACT inhaler Inhale 2 puffs into the lungs every 6 (six) hours as needed for wheezing or shortness of breath. 1 Inhaler 2  . amitriptyline (ELAVIL) 10 MG tablet Take 1-2 tablets (10-20 mg total) by mouth at bedtime. Take 2 tablets at bedtime. Total of 20mg  nightly  (Patient taking differently: Take 20 mg by mouth at bedtime. ) 30 tablet 3  . aspirin EC 81 MG tablet Take 81 mg by mouth daily.    . benzonatate (TESSALON) 100 MG capsule Take 1 capsule (100 mg total) by mouth 2 (two) times daily as needed for cough. 30 capsule 12  . Cholecalciferol 2000 units TABS Take 1 tablet by mouth daily.    Marland Kitchen etonogestrel (NEXPLANON) 68 MG IMPL implant 1 each by Subdermal route once.    . ferrous sulfate 325 (65 FE) MG tablet TAKE ONE TABLET BY MOUTH THREE TIMES DAILY WITH MEALS (Patient taking differently: take one tablet by mouth once daily) 90 tablet 3  . glimepiride (AMARYL) 4 MG tablet TAKE ONE TABLET BY MOUTH ONCE DAILY 90 tablet 1  . glucose blood (ACCU-CHEK ACTIVE STRIPS) test strip Use as instructed 100 each 12  . Insulin Glargine-Lixisenatide (SOLIQUA) 100-33 UNT-MCG/ML SOPN Inject 15 Units into the skin daily. 1 pen 12  . Insulin Pen Needle 32G X 4 MM MISC 1 Units by Does not apply route every morning. Pen needles 90 each 3  . isosorbide mononitrate (IMDUR) 30 MG 24 hr tablet TAKE ONE TABLET BY MOUTH ONCE DAILY 30 tablet 3  . loperamide (IMODIUM) 2 MG capsule Take 2 mg by mouth as needed for diarrhea or loose stools.    . meclizine (ANTIVERT) 32 MG tablet Take 1 tablet (32 mg total) by mouth 3 (three) times daily as needed. (Patient taking differently: Take 32 mg by mouth 3 (three) times daily as needed for dizziness or nausea. ) 30 tablet 0  . metoCLOPramide (REGLAN) 10 MG tablet Take 1 tablet (10 mg total) by mouth 3 (three) times daily before meals. 90 tablet 2  . metoprolol tartrate (LOPRESSOR) 50 MG tablet Take 100mg  AM and 50mg  PM 180 tablet 0  . nitroGLYCERIN (NITROSTAT) 0.4 MG SL tablet Place 1 tablet (0.4 mg total) under the tongue every 5 (five) minutes as needed for chest pain. Reported on 02/20/2016 50 tablet 3  . ondansetron (ZOFRAN ODT) 8 MG disintegrating tablet Take 1 tablet (8 mg total) by mouth every 8 (eight) hours as needed for nausea or  vomiting. 90 tablet 1  . pantoprazole (PROTONIX) 40 MG tablet TAKE ONE TABLET BY MOUTH ONCE DAILY AT 6 AM. 90 tablet 1  . rifaximin (XIFAXAN) 550 MG TABS tablet Take 1 tablet (550 mg total) by mouth 3 (three) times daily. 42 tablet 0  . rosuvastatin (CRESTOR) 5 MG tablet TAKE ONE TABLET BY MOUTH ONCE DAILY 30 tablet 3  . venlafaxine XR (EFFEXOR-XR) 150 MG 24 hr capsule Take 150 mg by mouth daily with breakfast.      No current facility-administered medications for this visit.     Allergies:   Lisinopril; Rosemary oil; Shellfish allergy; and Metformin and related    Social History:  The patient  reports that she quit smoking about 2 years ago. She quit after 15.00 years of use. She has never used smokeless tobacco. She reports  that she does not drink alcohol or use drugs.   Family History:  The patient's family history includes Cancer in her maternal grandfather and maternal grandmother; Cancer - Cervical in her maternal aunt; Diabetes in her father, paternal grandfather, and paternal grandmother.    ROS:  Please see the history of present illness.   Otherwise, review of systems are positive for none.   All other systems are reviewed and negative.    PHYSICAL EXAM: VS:  BP 134/86 (BP Location: Left Arm, Patient Position: Sitting, Cuff Size: Large)   Pulse 88   Ht 5\' 5"  (1.651 m)   Wt 234 lb (106.1 kg)   BMI 38.94 kg/m  , BMI Body mass index is 38.94 kg/m. GEN: Well nourished, well developed, in no acute distress  HEENT: normal  Neck: no JVD, carotid bruits, or masses Cardiac:Mildly tachycardic; no murmurs, rubs, or gallops,no edema  Respiratory:  clear to auscultation bilaterally, normal work of breathing GI: soft, nontender, nondistended, + BS MS: no deformity or atrophy  Skin: warm and dry, no rash Neuro:  Strength and sensation are intact Psych: euthymic mood, full affect   EKG:  EKG is  ordered today. EKG showed normal sinus rhythm with no significant ST or T wave changes.  Low voltage.   Recent Labs: 08/04/2017: Hemoglobin 10.2; Platelets 337 08/12/2017: ALT 18; BUN 18; Creatinine, Ser 1.32; Potassium 4.3; Sodium 140; TSH 2.220    Lipid Panel    Component Value Date/Time   CHOL 138 01/10/2017 0916   CHOL 106 08/30/2015 1439   CHOL 133 03/31/2015 0225   TRIG 84 01/10/2017 0916   TRIG 46 08/30/2015 1439   TRIG 77 03/31/2015 0225   HDL 39 (L) 01/10/2017 0916   HDL 26 (L) 03/31/2015 0225   CHOLHDL 3.5 01/10/2017 0916   VLDL 9 08/30/2015 1439   VLDL 15 03/31/2015 0225   LDLCALC 82 01/10/2017 0916   LDLCALC 92 03/31/2015 0225      Wt Readings from Last 3 Encounters:  09/01/17 234 lb (106.1 kg)  08/27/17 230 lb 6.4 oz (104.5 kg)  08/18/17 235 lb 2 oz (106.7 kg)        ASSESSMENT AND PLAN:  1. Coronary artery disease involving native coronary arteries with stable angina:    Current chest pain is different from her prior angina and could be GI in nature. No need for stress testing but I am going to obtain an echocardiogram. I encouraged her to keep her follow-up appointment with GI.  3. Chronic systolic heart failure: Ejection fraction improved to normal with most recent ejection fraction of 55-60%.  No need for diuretics.  Recheck EF.   4. Essential hypertension: Blood pressure is well controlled on current medications.  4. Hyperlipidemia:    Continue small dose rosuvastatin but We should consider increasing the dose if she can tolerate.  Disposition:   FU with me in 3 months  Signed,  Kathlyn Sacramento, MD  09/01/2017 3:56 PM    [place]

## 2017-09-01 NOTE — Patient Instructions (Addendum)
Medication Instructions:  Your physician recommends that you continue on your current medications as directed. Please refer to the Current Medication list given to you today.   Labwork: none  Testing/Procedures: Your physician has requested that you have an echocardiogram. Echocardiography is a painless test that uses sound waves to create images of your heart. It provides your doctor with information about the size and shape of your heart and how well your heart's chambers and valves are working. This procedure takes approximately one hour. There are no restrictions for this procedure.    Follow-Up: Your physician recommends that you schedule a follow-up appointment in: 3 months with Dr. Fletcher Anon.    Any Other Special Instructions Will Be Listed Below (If Applicable).     If you need a refill on your cardiac medications before your next appointment, please call your pharmacy.  Echocardiogram An echocardiogram, or echocardiography, uses sound waves (ultrasound) to produce an image of your heart. The echocardiogram is simple, painless, obtained within a short period of time, and offers valuable information to your health care provider. The images from an echocardiogram can provide information such as:  Evidence of coronary artery disease (CAD).  Heart size.  Heart muscle function.  Heart valve function.  Aneurysm detection.  Evidence of a past heart attack.  Fluid buildup around the heart.  Heart muscle thickening.  Assess heart valve function.  Tell a health care provider about:  Any allergies you have.  All medicines you are taking, including vitamins, herbs, eye drops, creams, and over-the-counter medicines.  Any problems you or family members have had with anesthetic medicines.  Any blood disorders you have.  Any surgeries you have had.  Any medical conditions you have.  Whether you are pregnant or may be pregnant. What happens before the procedure? No  special preparation is needed. Eat and drink normally. What happens during the procedure?  In order to produce an image of your heart, gel will be applied to your chest and a wand-like tool (transducer) will be moved over your chest. The gel will help transmit the sound waves from the transducer. The sound waves will harmlessly bounce off your heart to allow the heart images to be captured in real-time motion. These images will then be recorded.  You may need an IV to receive a medicine that improves the quality of the pictures. What happens after the procedure? You may return to your normal schedule including diet, activities, and medicines, unless your health care provider tells you otherwise. This information is not intended to replace advice given to you by your health care provider. Make sure you discuss any questions you have with your health care provider. Document Released: 11/22/2000 Document Revised: 07/13/2016 Document Reviewed: 08/02/2013 Elsevier Interactive Patient Education  2017 [employer].

## 2017-09-03 ENCOUNTER — Telehealth: Payer: Self-pay

## 2017-09-03 NOTE — Telephone Encounter (Signed)
Called and left patient a VM asking for her to please return my call.  

## 2017-09-03 NOTE — Telephone Encounter (Signed)
I gave her samples correct?  Let's see how she does on the [place] and I will switch her when I see her

## 2017-09-03 NOTE — Telephone Encounter (Signed)
Patient's PA for soliqua was denied. It states that they must have documentation of the patient trying and failing lantus, basaglar, toujeo. Levemir, tresiba, or adiyxin. Patient was on Lantus in the past but changed to Trulicity for getting hypoglycemic on insulin. Could we try one of the other listed medications?

## 2017-09-04 NOTE — Telephone Encounter (Signed)
Called and let patient know about soliqua. She states that she was given a sample but will run out before her October appointment. Will sign out another sample for patient to pick up.

## 2017-09-15 ENCOUNTER — Ambulatory Visit: Payer: Medicaid Other | Admitting: Cardiovascular Disease

## 2017-09-15 ENCOUNTER — Other Ambulatory Visit: Payer: Self-pay

## 2017-09-15 ENCOUNTER — Ambulatory Visit (INDEPENDENT_AMBULATORY_CARE_PROVIDER_SITE_OTHER): Payer: Medicaid Other

## 2017-09-15 DIAGNOSIS — R0602 Shortness of breath: Secondary | ICD-10-CM

## 2017-09-15 DIAGNOSIS — R079 Chest pain, unspecified: Secondary | ICD-10-CM

## 2017-09-29 ENCOUNTER — Ambulatory Visit (INDEPENDENT_AMBULATORY_CARE_PROVIDER_SITE_OTHER): Payer: Medicaid Other | Admitting: Unknown Physician Specialty

## 2017-09-29 ENCOUNTER — Telehealth: Payer: Self-pay | Admitting: Cardiovascular Disease

## 2017-09-29 ENCOUNTER — Encounter: Payer: Self-pay | Admitting: Unknown Physician Specialty

## 2017-09-29 DIAGNOSIS — E1165 Type 2 diabetes mellitus with hyperglycemia: Secondary | ICD-10-CM

## 2017-09-29 DIAGNOSIS — F5101 Primary insomnia: Secondary | ICD-10-CM

## 2017-09-29 MED ORDER — DULAGLUTIDE 1.5 MG/0.5ML ~~LOC~~ SOAJ: 1.5000 mg | SUBCUTANEOUS | 4 refills | Status: DC

## 2017-09-29 NOTE — Assessment & Plan Note (Addendum)
Worsening.  OK to increase Amitriptyline as high as 50 mg.  Start a walking program

## 2017-09-29 NOTE — Assessment & Plan Note (Addendum)
Continued poor control and insurance issues.  Change from [place] due to insurance concerns. Restart with Lantus 40 u QHS with titration instructions.  Restart Trulicity 1.5 weekly

## 2017-09-29 NOTE — Patient Instructions (Signed)
Base your long acting insulin on your fasting (usually in the morning) blood sugar.  Increase long acting (daily insulin) 2 units if fasting blood sugar is greater than 120.  Decrease by 2 units if fasting blood sugar is less than 95.    

## 2017-09-29 NOTE — Telephone Encounter (Signed)
Pt c/o of Chest Pain: STAT if CP now or developed within 24 hours  1. Are you having CP right now? yes  2. Are you experiencing any other symptoms (ex. SOB, nausea, vomiting, sweating)? No   3. How long have you been experiencing CP? 7 am a deep sharp stabbing pain, is worse when she takes a deep breath.  4. Is your CP continuous or coming and going? Comes and goes   5. Have you taken Nitroglycerin? 3 since 7 am.  ?

## 2017-09-29 NOTE — Progress Notes (Signed)
BP 114/76   Pulse 80   Temp 98.6 F (37 C)   Wt 236 lb 9.6 oz (107.3 kg)   SpO2 99%   BMI 39.37 kg/m    Subjective:    Patient ID: [name] [name], female    DOB: February 10, 1981, 36 y.o.   MRN: [id number]  HPI: [name] [name] is a 36 y.o. female  Chief Complaint  Patient presents with  . Diabetes    4 week f/up   Diabetes Pt taking 40 units of Soliqua.  However, insurance is not covering this medication.  She states AM blood sugar is 180.  No current side-effects  Insomnia Pt was getting relief with Amitriptyline 20 mg.  States she is not sleeping at all.     Relevant past medical, surgical, family and social history reviewed and updated as indicated. Interim medical history since our last visit reviewed. Allergies and medications reviewed and updated.  Review of Systems  Per HPI unless specifically indicated above     Objective:    BP 114/76   Pulse 80   Temp 98.6 F (37 C)   Wt 236 lb 9.6 oz (107.3 kg)   SpO2 99%   BMI 39.37 kg/m   Wt Readings from Last 3 Encounters:  09/29/17 236 lb 9.6 oz (107.3 kg)  09/01/17 234 lb (106.1 kg)  08/27/17 230 lb 6.4 oz (104.5 kg)    Physical Exam  Constitutional: She is oriented to person, place, and time. She appears well-developed and well-nourished. No distress.  HENT:  Head: Normocephalic and atraumatic.  Eyes: Conjunctivae and lids are normal. Right eye exhibits no discharge. Left eye exhibits no discharge. No scleral icterus.  Neck: Normal range of motion. Neck supple. No JVD present. Carotid bruit is not present.  Cardiovascular: Normal rate, regular rhythm and normal heart sounds.   Pulmonary/Chest: Effort normal and breath sounds normal.  Abdominal: Normal appearance. There is no splenomegaly or hepatomegaly.  Musculoskeletal: Normal range of motion.  Neurological: She is alert and oriented to person, place, and time.  Skin: Skin is warm, dry and intact. No rash noted. No pallor.  Psychiatric: She has a normal  mood and affect. Her behavior is normal. Judgment and thought content normal.    Results for orders placed or performed in visit on 08/12/17  Prolactin  Result Value Ref Range   Prolactin 27.6 (H) 4.8 - 23.3 ng/mL  Thyroid Panel With TSH  Result Value Ref Range   TSH 2.220 0.450 - 4.500 uIU/mL   T4, Total 6.1 4.5 - 12.0 ug/dL   T3 Uptake Ratio 24 24 - 39 %   Free Thyroxine Index 1.5 1.2 - 4.9  Sed Rate (ESR)  Result Value Ref Range   Sed Rate 38 (H) 0 - 32 mm/hr  C-reactive protein  Result Value Ref Range   CRP 26.2 (H) 0.0 - 4.9 mg/L  Hgb A1c w/o eAG  Result Value Ref Range   Hgb A1c MFr Bld 11.9 (H) 4.8 - 5.6 %  Comprehensive metabolic panel  Result Value Ref Range   Glucose 285 (H) 65 - 99 mg/dL   BUN 18 6 - 20 mg/dL   Creatinine, Ser 1.32 (H) 0.57 - 1.00 mg/dL   GFR calc non Af Amer 52 (L) >59 mL/min/1.73   GFR calc Af Amer 59 (L) >59 mL/min/1.73   BUN/Creatinine Ratio 14 9 - 23   Sodium 140 134 - 144 mmol/L   Potassium 4.3 3.5 - 5.2 mmol/L   Chloride 102  96 - 106 mmol/L   CO2 20 20 - 29 mmol/L   Calcium 8.9 8.7 - 10.2 mg/dL   Total Protein 6.6 6.0 - 8.5 g/dL   Albumin 3.3 (L) 3.5 - 5.5 g/dL   Globulin, Total 3.3 1.5 - 4.5 g/dL   Albumin/Globulin Ratio 1.0 (L) 1.2 - 2.2   Bilirubin Total <0.2 0.0 - 1.2 mg/dL   Alkaline Phosphatase 123 (H) 39 - 117 IU/L   AST 12 0 - 40 IU/L   ALT 18 0 - 32 IU/L      Assessment & Plan:   Problem List Items Addressed This Visit      Unprioritized   Insomnia    Worsening.  OK to increase Amitriptyline as high as 50 mg.  Start a walking program      Poorly controlled type 2 diabetes mellitus ([place])    Continued poor control and insurance issues.  Change from [place] due to insurance concerns. Restart with Lantus 40 u QHS with titration instructions.  Restart Trulicity 1.5 weekly      Relevant Medications   Dulaglutide (TRULICITY) 1.5 TM/2.2QJ SOPN       Follow up plan: Return in about 4 weeks (around  10/27/2017).

## 2017-09-29 NOTE — Telephone Encounter (Signed)
Pt reports diarrhea beginning at 3am followed by sharp chest pains and upper left shoulder pain beginning at 7am  Pains are constant and worse with inspiration. Rates them 8 out of 10. Unrelieved with 3 nitro. Denies, back or jaw pain, or diaphoresis She has been coughing for a week; sinus drainage as well.  No fever.  She is unsure if sx are related to cough and drainage or cardiac related. Pt reports nephew has a cold and sore throat. Sister has had diarrhea.  Pt has hx of 2016 NSTEMI w/stent placement. Saw Dr. Fletcher Anon in Sept for same sx. Echo ordered and was normal.  I offered pt 2:30pm appt today with Christell Faith, PA-C but pt declines as she has PCP appt at same time today. She will call back if she decides to change PCP appt and will proceed to ER for further evaluation if sx persist or worsen.

## 2017-09-30 ENCOUNTER — Other Ambulatory Visit: Payer: Self-pay | Admitting: Unknown Physician Specialty

## 2017-09-30 NOTE — Telephone Encounter (Signed)
Rx Auth Request  Received: Today  Lexicographer, Surescripts Out  P [facility] Rx Refill  Caller: Unspecified (Today, 12:29 PM)      Previous Messages        Requested Medications  LANTUS VIAL 100U/ML INJ Will file in chart as: LANTUS 100 UNIT/ML injection The source prescription was discontinued on 06/21/2016 by Georgina Peer, CMA for the following reason: Patient has not taken in last 30 days. INJECT 60 UNITS SUBCUTANEOUSLY AT BEDTIME TITRATE BASED ON AM BLOOD SUGAR     Disp: Not specified (Pharmacy requested 10 each)  Refills: 11    Class: Normal Start: 09/30/2017  Originally ordered: 1 year ago by Kathrine Haddock, NP Last refill: 03/27/2017 To pharmacy: Please consider 90 day supplies to promote better adherence To be filled at: [facility] [address] [place] - [place]: [phone] Medication Refill   Interface, [place] routed conversation to [facility] Rx Refill 1 hour ago (12:29 PM)

## 2017-10-01 ENCOUNTER — Encounter: Payer: Self-pay | Admitting: Unknown Physician Specialty

## 2017-10-01 ENCOUNTER — Other Ambulatory Visit: Payer: Self-pay | Admitting: Unknown Physician Specialty

## 2017-10-02 IMAGING — DX DG CHEST 1V PORT
1 series · 1 of 1 positions shown · non-contrast
Comparison: 02/13/2016

CLINICAL DATA: Chest pain starting this morning. Left-sided
pressure. Radiates to the back and left arm. Shortness of breath
with nausea and vomiting.

EXAM:
PORTABLE CHEST 1 VIEW

[chest ap]
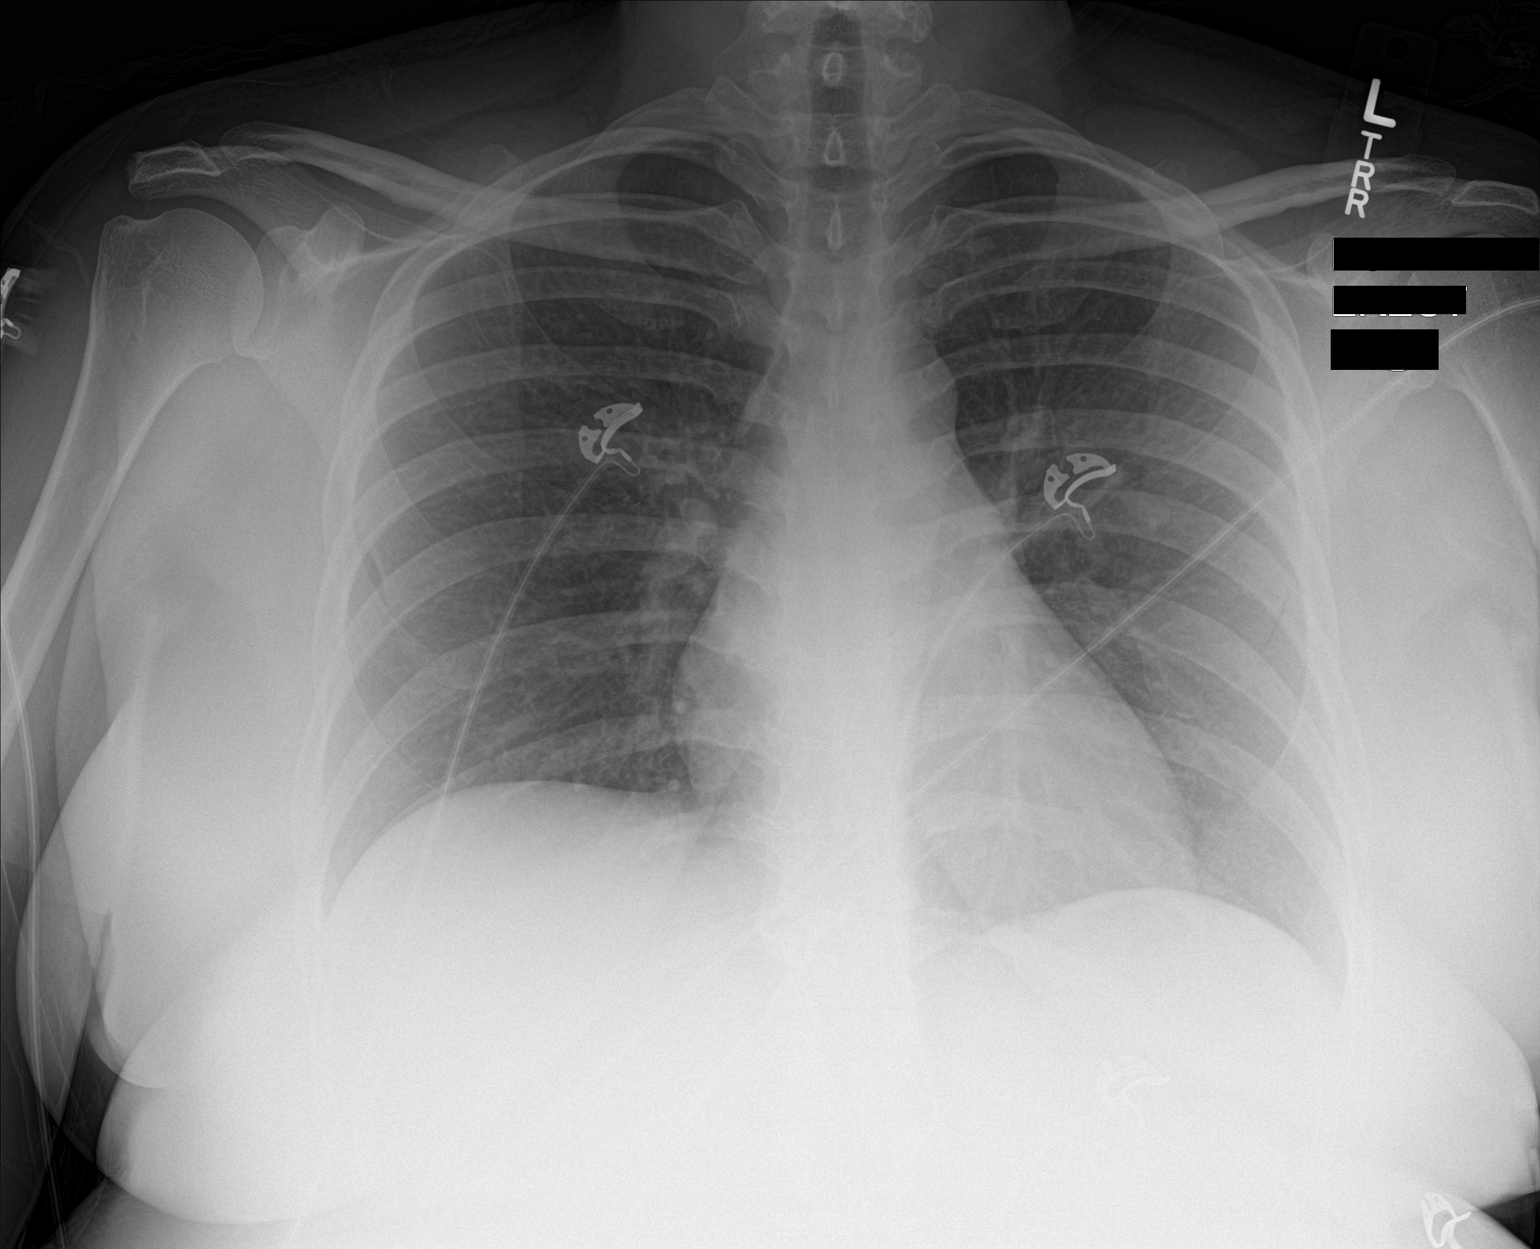

[1 of 1 positions shown; findings below may reference images not displayed]

FINDINGS: The heart size and mediastinal contours are within normal limits.
Both lungs are clear. The visualized skeletal structures are
unremarkable.
IMPRESSION: No active disease.

## 2017-10-02 MED ORDER — INSULIN GLARGINE 100 UNIT/ML SOLOSTAR PEN: 60.0000 [IU] | PEN_INJECTOR | Freq: Every day | SUBCUTANEOUS | 12 refills | Status: DC

## 2017-10-02 NOTE — Telephone Encounter (Signed)
Phone call to pt.  Left voice message that the Trulicity has been approved by the insurance co., and both the Trulicity and Lantus have been ordered at the pharmacy.  Advised to call the office re: any further questions.

## 2017-10-02 NOTE — Telephone Encounter (Signed)
Copied from [place] [id number]. Topic: Inquiry >> Sep 30, 2017 12:35 PM Darl Householder, RMA wrote: Reason for CRM: patient needs a prior auth for trulicity and medication refill on lantus because it is expired, please send refills to [facility] on [address]

## 2017-10-02 NOTE — Telephone Encounter (Signed)
Called and left patient a VM asking for her to please return my call. OK for [facility] to tell patient that Lantus has been sent in and Trulicity has been approved by insurance if patient calls back.

## 2017-10-02 NOTE — Telephone Encounter (Signed)
Please review this med. Looks like it was discontinued last year.

## 2017-10-02 NOTE — Telephone Encounter (Signed)
Trulicity approved for 4 pens a month for 1 year. PA # [id number]  Needs refill for Lantus because it expired. Please send in refill.

## 2017-10-02 NOTE — Telephone Encounter (Signed)
Patient sent in a mychart message and was notified about medications in the response.

## 2017-10-04 ENCOUNTER — Other Ambulatory Visit: Payer: Self-pay | Admitting: Cardiovascular Disease

## 2017-10-06 ENCOUNTER — Encounter: Payer: Self-pay | Admitting: Cardiovascular Disease

## 2017-10-08 ENCOUNTER — Emergency Department
Admission: EM | Admit: 2017-10-08 | Discharge: 2017-10-08 | Disposition: A | Discharge status: Home / Self Care | Payer: Medicaid Other | Attending: Emergency Medicine | Admitting: Emergency Medicine

## 2017-10-08 ENCOUNTER — Emergency Department: Payer: Medicaid Other

## 2017-10-08 DIAGNOSIS — Z7982 Long term (current) use of aspirin: Secondary | ICD-10-CM | POA: Insufficient documentation

## 2017-10-08 DIAGNOSIS — I110 Hypertensive heart disease with heart failure: Secondary | ICD-10-CM | POA: Diagnosis not present

## 2017-10-08 DIAGNOSIS — Z794 Long term (current) use of insulin: Secondary | ICD-10-CM | POA: Insufficient documentation

## 2017-10-08 DIAGNOSIS — I5032 Chronic diastolic (congestive) heart failure: Secondary | ICD-10-CM | POA: Diagnosis not present

## 2017-10-08 DIAGNOSIS — I2510 Atherosclerotic heart disease of native coronary artery without angina pectoris: Secondary | ICD-10-CM | POA: Insufficient documentation

## 2017-10-08 DIAGNOSIS — F329 Major depressive disorder, single episode, unspecified: Secondary | ICD-10-CM | POA: Insufficient documentation

## 2017-10-08 DIAGNOSIS — Z87891 Personal history of nicotine dependence: Secondary | ICD-10-CM | POA: Insufficient documentation

## 2017-10-08 DIAGNOSIS — R079 Chest pain, unspecified: Secondary | ICD-10-CM | POA: Insufficient documentation

## 2017-10-08 DIAGNOSIS — Z79899 Other long term (current) drug therapy: Secondary | ICD-10-CM | POA: Insufficient documentation

## 2017-10-08 DIAGNOSIS — E119 Type 2 diabetes mellitus without complications: Secondary | ICD-10-CM | POA: Diagnosis not present

## 2017-10-08 LAB — BASIC METABOLIC PANEL
Anion gap: 7 (ref 5–15)
BUN: 21 mg/dL — ABNORMAL HIGH (ref 6–20)
CO2: 26 mmol/L (ref 22–32)
Calcium: 8.6 mg/dL — ABNORMAL LOW (ref 8.9–10.3)
Chloride: 103 mmol/L (ref 101–111)
Creatinine, Ser: 1.27 mg/dL — ABNORMAL HIGH (ref 0.44–1.00)
GFR calc Af Amer: >60 mL/min (ref >60)
GFR calc non Af Amer: 53 mL/min — ABNORMAL LOW (ref >60)
Glucose, Bld: 347 mg/dL — ABNORMAL HIGH (ref 65–99)
Potassium: 3.6 mmol/L (ref 3.5–5.1)
Sodium: 136 mmol/L (ref 135–145)

## 2017-10-08 LAB — CBC WITH DIFFERENTIAL/PLATELET
Basophils Absolute: 0.1 10*3/uL (ref 0–0.1)
Basophils Relative: 1 %
Eosinophils Absolute: 1.2 10*3/uL — ABNORMAL HIGH (ref 0–0.7)
Eosinophils Relative: 13 %
HCT: 34.7 % — ABNORMAL LOW (ref 35.0–47.0)
Hemoglobin: 11.3 g/dL — ABNORMAL LOW (ref 12.0–16.0)
Lymphocytes Relative: 28 %
Lymphs Abs: 2.5 10*3/uL (ref 1.0–3.6)
MCH: 26.3 pg (ref 26.0–34.0)
MCHC: 32.7 g/dL (ref 32.0–36.0)
MCV: 80.4 fL (ref 80.0–100.0)
Monocytes Absolute: 0.4 10*3/uL (ref 0.2–0.9)
Monocytes Relative: 4 %
Neutro Abs: 4.8 10*3/uL (ref 1.4–6.5)
Neutrophils Relative %: 54 %
Platelets: 363 10*3/uL (ref 150–440)
RBC: 4.32 MIL/uL (ref 3.80–5.20)
RDW: 13.5 % (ref 11.5–14.5)
WBC: 8.9 10*3/uL (ref 3.6–11.0)

## 2017-10-08 LAB — GLUCOSE, CAPILLARY
Glucose-Capillary: 383 mg/dL — ABNORMAL HIGH (ref 65–99)
Glucose-Capillary: 398 mg/dL — ABNORMAL HIGH (ref 65–99)
Glucose-Capillary: 365 mg/dL — ABNORMAL HIGH (ref 65–99)

## 2017-10-08 LAB — TROPONIN I
Troponin I: <0.03 ng/mL (ref <0.03)
Troponin I: <0.03 ng/mL (ref <0.03)

## 2017-10-08 LAB — BRAIN NATRIURETIC PEPTIDE: B Natriuretic Peptide: 34 pg/mL (ref 0.0–100.0)

## 2017-10-08 MED ORDER — INSULIN GLARGINE 100 UNIT/ML ~~LOC~~ SOLN
42.0000 [IU] | Freq: Once | SUBCUTANEOUS | Status: AC
  Administered 2017-10-08: 42 [IU] via SUBCUTANEOUS
  Filled 2017-10-08: qty 0.42

## 2017-10-08 MED ORDER — INSULIN ASPART PROT & ASPART (70-30 MIX) 100 UNIT/ML ~~LOC~~ SUSP
7.0000 [IU] | Freq: Once | SUBCUTANEOUS | Status: AC
  Administered 2017-10-08: 7 [IU] via SUBCUTANEOUS
  Filled 2017-10-08: qty 10

## 2017-10-08 MED ORDER — ACETAMINOPHEN 500 MG PO TABS
ORAL_TABLET | ORAL | Status: AC
  Administered 2017-10-08: 1000 mg via ORAL
  Filled 2017-10-08: qty 2

## 2017-10-08 MED ORDER — ACETAMINOPHEN 500 MG PO TABS
1000.0000 mg | ORAL_TABLET | Freq: Once | ORAL | Status: AC
  Administered 2017-10-08: 1000 mg via ORAL

## 2017-10-08 MED ORDER — INSULIN ASPART 100 UNIT/ML ~~LOC~~ SOLN
10.0000 [IU] | Freq: Once | SUBCUTANEOUS | Status: AC
  Administered 2017-10-08: 10 [IU] via INTRAVENOUS
  Filled 2017-10-08: qty 1

## 2017-10-08 MED ORDER — SODIUM CHLORIDE 0.9 % IV BOLUS (SEPSIS)
500.0000 mL | INTRAVENOUS | Status: AC
  Administered 2017-10-08: 500 mL via INTRAVENOUS

## 2017-10-08 NOTE — ED Provider Notes (Signed)
[facility] Emergency Department Provider Note  ____________________________________________   First MD Initiated Contact with Patient 10/08/17 [phone]     (approximate)  I have reviewed the triage vital signs and the nursing notes.   HISTORY  Chief Complaint Chest Pain    HPI [name] [name] is a 36 y.o. female with a significant cardiac medical history particularly for someone of her age (NSTEMI with two-vessel disease diagnosed about 2 years ago with resultant CHF with an EF of 30-35%) who presents by EMS for evaluation of chest pain, left arm pain, nausea, and some shortness of breath.  She states that she has been feeling ill with upper respiratory symptoms for about a week.  She also states that she has chronic chest pain, which I confirmed in her cardiology notes (Dr. Fletcher Anon is her cardiologist).  She states that her chest pain feels similar to her usual chest pain but it felt stronger tonight.  She describes it as sharp and moderate in severity.  She states that nothing in particular makes it better nor worse.  She states that she has a history of anxiety and panic attacks and she has been off 1 of her medications and she thinks that may be contributing to the symptoms.  She feels better after getting to the emergency department.  She has had some nausea but no vomiting.  She denies abdominal pain and dysuria as well as fever/chills.  She had a fever a week ago for several days with nasal congestion runny nose and cough, but the fever and nasal congestion have resolved but she has some persistent cough.  Past Medical History:  Diagnosis Date  . Chronic systolic CHF (congestive heart failure) ([place])    a. echo 03/2015: EF 30-35%, mild concentric LVH, severe HK of inf, inflat, & lat walls, mod MR, mild TR;  b. 04/2015 LV Gram: EF 55-65%.  . Diabetes type 2, controlled ([place])    a. since 2002   . Diverticulosis, sigmoid 07/2016  . Heavy menses    a. H/O IUD - expired  in 2014 - remains in place.  . Hyperlipidemia   . Hypertension   . Iron deficiency anemia   . Ischemic cardiomyopathy    a. 03/2015 EF 30-35% post NSTEMI;  b. 04/2015 EF 55-65% on LV gram.  . Kidney disease   . MI (myocardial infarction) ([place]) 03/29/2015  . Obesity   . Renal insufficiency   . Tobacco abuse    a. quit 03/2015.  Marland Kitchen Uterine fibroid   . Vertigo   . Vitamin D deficiency     Patient Active Problem List   Diagnosis Date Noted  . Poorly controlled type 2 diabetes mellitus ([place]) 08/27/2017  . Lung nodule < 6cm on CT 08/12/2017  . Depression, major, single episode, severe ([place]) 08/12/2017  . Chronic abdominal pain 08/03/2017  . Obstructive sleep apnea 05/16/2017  . Neuropathy 01/17/2017  . Edema of both ankles 01/17/2017  . Fatigue 12/16/2016  . Myalgic 10/25/2016  . Vitamin D deficiency 10/25/2016  . Insomnia 10/25/2016  . Urinary tract infection 09/20/2016  . HTN (hypertension) 09/05/2016  . Vertigo 06/21/2016  . Elevated liver function tests 06/05/2016  . Chronic diastolic heart failure ([place]) 03/19/2016  . Tachycardia 03/19/2016  . IBS (irritable bowel syndrome) 12/15/2015  . Proteinuria 08/30/2015  . Coronary artery disease involving native coronary artery of native heart with angina pectoris with documented spasm ([place])   . Angina pectoris ([place])   . Iron deficiency anemia   .  Heavy menses   . SOB (shortness of breath) 08/21/2015  . Hyperlipidemia 04/13/2015  . Ischemic cardiomyopathy   . Obesity     Past Surgical History:  Procedure Laterality Date  . APPENDECTOMY    . CARDIAC CATHETERIZATION  4/16   x2 stent [facility]  . CARDIAC CATHETERIZATION N/A 05/05/2015   Procedure: Left Heart Cath and Coronary Angiography;  Surgeon: Peter M Martinique, MD;  Location: [facility];  Service: Cardiovascular;  Laterality: N/A;  . COLONOSCOPY WITH PROPOFOL N/A 07/31/2016   Procedure: COLONOSCOPY WITH PROPOFOL;  Surgeon: Lollie Sails, MD;  Location: [facility] ENDOSCOPY;   Service: Endoscopy;  Laterality: N/A;  . ESOPHAGOGASTRODUODENOSCOPY (EGD) WITH PROPOFOL N/A 07/31/2016   Procedure: ESOPHAGOGASTRODUODENOSCOPY (EGD) WITH PROPOFOL;  Surgeon: Lollie Sails, MD;  Location: [facility] ENDOSCOPY;  Service: Endoscopy;  Laterality: N/A;  . LAPAROSCOPIC APPENDECTOMY N/A 08/07/2016   Procedure: APPENDECTOMY LAPAROSCOPIC;  Surgeon: Hubbard Robinson, MD;  Location: [facility] ORS;  Service: General;  Laterality: N/A;  . MOUTH SURGERY      Prior to Admission medications   Medication Sig Start Date End Date Taking? Authorizing Provider  albuterol (PROVENTIL HFA;VENTOLIN HFA) 108 (90 Base) MCG/ACT inhaler Inhale 2 puffs into the lungs every 6 (six) hours as needed for wheezing or shortness of breath. 02/19/17   Volney American, PA-C  amitriptyline (ELAVIL) 10 MG tablet Take 1-2 tablets (10-20 mg total) by mouth at bedtime. Take 2 tablets at bedtime. Total of 20mg  nightly Patient taking differently: Take 20 mg by mouth at bedtime.  05/15/17   Armbruster, Carlota Raspberry, MD  aspirin EC 81 MG tablet Take 81 mg by mouth daily.    [provider]  benzonatate (TESSALON) 100 MG capsule Take 1 capsule (100 mg total) by mouth 2 (two) times daily as needed for cough. 12/20/16   Park Liter P, DO  Cholecalciferol 2000 units TABS Take 1 tablet by mouth daily.    [provider]  Dulaglutide (TRULICITY) 1.5 OZ/3.0QM SOPN Inject 1.5 mg into the skin once a week. 09/29/17   Kathrine Haddock, NP  etonogestrel (NEXPLANON) 68 MG IMPL implant 1 each by Subdermal route once.    [provider]  ferrous sulfate 325 (65 FE) MG tablet TAKE ONE TABLET BY MOUTH THREE TIMES DAILY WITH MEALS Patient taking differently: take one tablet by mouth once daily 03/03/17   Kathrine Haddock, NP  glimepiride (AMARYL) 4 MG tablet TAKE ONE TABLET BY MOUTH ONCE DAILY 05/02/17   Park Liter P, DO  glucose blood (ACCU-CHEK ACTIVE STRIPS) test strip Use as instructed 03/25/16   Kathrine Haddock, NP    Insulin Glargine (LANTUS SOLOSTAR) 100 UNIT/ML Solostar Pen Inject 60 Units into the skin daily at 10 pm. 10/02/17   Kathrine Haddock, NP  Insulin Glargine-Lixisenatide (SOLIQUA) 100-33 UNT-MCG/ML SOPN Inject 15 Units into the skin daily. 08/27/17   Kathrine Haddock, NP  Insulin Pen Needle 32G X 4 MM MISC 1 Units by Does not apply route every morning. Pen needles 08/27/17   Kathrine Haddock, NP  isosorbide mononitrate (IMDUR) 30 MG 24 hr tablet TAKE 1 TABLET BY MOUTH ONCE DAILY 10/06/17   Wellington Hampshire, MD  loperamide (IMODIUM) 2 MG capsule Take 2 mg by mouth as needed for diarrhea or loose stools.    [provider]  meclizine (ANTIVERT) 32 MG tablet Take 1 tablet (32 mg total) by mouth 3 (three) times daily as needed. Patient taking differently: Take 32 mg by mouth 3 (three) times daily as  needed for dizziness or nausea.  06/21/16   Kathrine Haddock, NP  metoCLOPramide (REGLAN) 10 MG tablet Take 1 tablet (10 mg total) by mouth 3 (three) times daily before meals. 06/02/17   Armbruster, Carlota Raspberry, MD  metoprolol tartrate (LOPRESSOR) 50 MG tablet Take 100mg  AM and 50mg  PM 08/18/17   [name] A, FNP  nitroGLYCERIN (NITROSTAT) 0.4 MG SL tablet Place 1 tablet (0.4 mg total) under the tongue every 5 (five) minutes as needed for chest pain. Reported on 02/20/2016 03/17/17   Alisa Graff, FNP  ondansetron (ZOFRAN ODT) 8 MG disintegrating tablet Take 1 tablet (8 mg total) by mouth every 8 (eight) hours as needed for nausea or vomiting. 05/15/17   Armbruster, Carlota Raspberry, MD  pantoprazole (PROTONIX) 40 MG tablet TAKE ONE TABLET BY MOUTH ONCE DAILY AT 6 AM. 04/28/17   Volney American, PA-C  rifaximin (XIFAXAN) 550 MG TABS tablet Take 1 tablet (550 mg total) by mouth 3 (three) times daily. Patient not taking: Reported on 09/29/2017 06/20/17   Yetta Flock, MD  rosuvastatin (CRESTOR) 5 MG tablet TAKE ONE TABLET BY MOUTH ONCE DAILY 06/02/17   Wellington Hampshire, MD  venlafaxine XR (EFFEXOR-XR) 150 MG  24 hr capsule Take 150 mg by mouth daily with breakfast.     [provider]    Allergies Lisinopril; Rosemary oil; Shellfish allergy; and Metformin and related  Family History  Problem Relation Age of Onset  . Diabetes Father   . Cancer Maternal Grandmother        lung  . Cancer Maternal Grandfather        prostate  . Diabetes Paternal Grandfather   . Diabetes Paternal Grandmother   . Cancer - Cervical Maternal Aunt     Social History Social History  Substance Use Topics  . Smoking status: Former Smoker    Years: 15.00    Quit date: 03/10/2015  . Smokeless tobacco: Never Used  . Alcohol use No    Review of Systems Constitutional: No fever/chills since a week ago Eyes: No visual changes. ENT: No sore throat. Cardiovascular: Acute on chronic chest pain Respiratory: Mild shortness of breath and cough Gastrointestinal: No abdominal pain.  No nausea, no vomiting.  No diarrhea.  No constipation. Genitourinary: Negative for dysuria. Musculoskeletal: Negative for neck pain.  Negative for back pain. Integumentary: Negative for rash. Neurological: Negative for headaches, focal weakness or numbness.   ____________________________________________   PHYSICAL EXAM:  VITAL SIGNS: ED Triage Vitals  Enc Vitals Group     BP 10/08/17 0432 (!) 173/104     Pulse Rate 10/08/17 0432 (!) 115     Resp 10/08/17 0432 20     Temp 10/08/17 0432 98.1 F (36.7 C)     Temp Source 10/08/17 0432 Oral     SpO2 10/08/17 0432 100 %     Weight 10/08/17 0430 106.1 kg (234 lb)     Height 10/08/17 0430 1.651 m (5\' 5" )     Head Circumference --      Peak Flow --      Pain Score 10/08/17 0430 9     Pain Loc --      Pain Edu? --      Excl. in [place]? --     Constitutional: Alert and oriented. Well appearing and in no acute distress. Eyes: Conjunctivae are normal.  Head: Atraumatic. Nose: No congestion/rhinnorhea. Mouth/Throat: Mucous membranes are moist. Neck: No stridor.  No meningeal  signs.   Cardiovascular: Mild  tachycardia at rest, regular rhythm. Good peripheral circulation. Grossly normal heart sounds. Respiratory: Normal respiratory effort.  No retractions. Lungs CTAB. Gastrointestinal: Soft and nontender. No distention.  Musculoskeletal: No lower extremity tenderness nor edema. No gross deformities of extremities. Neurologic:  Normal speech and language. No gross focal neurologic deficits are appreciated.  Skin:  Skin is warm, dry and intact. No rash noted. Psychiatric: Mood and affect are normal. Speech and behavior are normal.  ____________________________________________   LABS (all labs ordered are listed, but only abnormal results are displayed)  Labs Reviewed  CBC WITH DIFFERENTIAL/PLATELET - Abnormal; Notable for the following:       Result Value   Hemoglobin 11.3 (*)    HCT 34.7 (*)    Eosinophils Absolute 1.2 (*)    All other components within normal limits  BASIC METABOLIC PANEL - Abnormal; Notable for the following:    Glucose, Bld 347 (*)    BUN 21 (*)    Creatinine, Ser 1.27 (*)    Calcium 8.6 (*)    GFR calc non Af Amer 53 (*)    All other components within normal limits  BRAIN NATRIURETIC PEPTIDE  TROPONIN I  TROPONIN I   ____________________________________________  EKG  ED ECG REPORT I, Amenah Tucci, the attending physician, personally viewed and interpreted this ECG.  Date: 10/08/2017 EKG Time: 4:30 AM Rate: 116 Rhythm: Sinus tachycardia QRS Axis: normal Intervals: normal ST/T Wave abnormalities: Non-specific ST segment / T-wave changes, but no evidence of acute ischemia. Narrative Interpretation: no evidence of acute ischemia   ____________________________________________  RADIOLOGY I, Carleigh Buccieri, personally viewed and evaluated these images (plain radiographs) as part of my medical decision making, as well as reviewing the written report by the radiologist.  Dg Chest 2 View  Result Date: 10/08/2017 CLINICAL  DATA:  Chest pain EXAM: CHEST  2 VIEW COMPARISON:  Chest CT 08/03/2017 Chest radiograph 08/03/2017 FINDINGS: The heart size and mediastinal contours are within normal limits. Both lungs are clear. The visualized skeletal structures are unremarkable. IMPRESSION: No active cardiopulmonary disease. Electronically Signed   By: Ulyses Jarred M.D.   On: 10/08/2017 05:20    ____________________________________________   PROCEDURES  Critical Care performed: No   Procedure(s) performed:   Procedures   ____________________________________________   INITIAL IMPRESSION / ASSESSMENT AND PLAN / ED COURSE  As part of my medical decision making, I reviewed the following data within the [facility] notes reviewed and incorporated, Labs reviewed , EKG interpreted  and Old chart reviewed    Differential diagnosis includes, but is not limited to, ACS, aortic dissection, pulmonary embolism, cardiac tamponade, pneumothorax, pneumonia, pericarditis/myocarditis, GI-related causes including esophagitis/gastritis, and musculoskeletal chest wall pain.    Given her history of NSTEMI/CAD at such a young age, that must be given careful consideration.  However, he has a well-documented history of stable angina and this feels similar to that.  It could be exacerbated by her recent viral syndrome.  There is nothing at this point to suggest myocarditis/pericarditis.  She does have some resting tachycardia but she states that she has been told in the past that her heart rate is faster than usual although typically her metoprolol controls that.  Her lab work is reassuring with an essentially normal CBC, metabolic panel except for hyperglycemia, normal BNP, and negative troponin.  No evidence of acute abnormality on chest x-ray nor EKG.  Given her tachycardia and decreased oral intake due to recent viral illness, I will give her a  small fluid bolus.  We will check a second troponin and reassess how she  is feeling.  If she continues to feel well at that point she is comfortable with the plan for outpatient follow-up.  Clinical Course as of Oct 08 813  Wed Oct 08, 2017  0812 Transferring care to Dr. Alfred Levins to follow up second troponin and reassess.  [CF]    Clinical Course User Index [CF] Hinda Kehr, MD    ____________________________________________  FINAL CLINICAL IMPRESSION(S) / ED DIAGNOSES  Final diagnoses:  Chest pain, unspecified type     MEDICATIONS GIVEN DURING THIS VISIT:  Medications  sodium chloride 0.9 % bolus 500 mL (not administered)     NEW OUTPATIENT MEDICATIONS STARTED DURING THIS VISIT:  New Prescriptions   No medications on file    Modified Medications   No medications on file    Discontinued Medications   No medications on file     Note:  This document was prepared using Dragon voice recognition software and may include unintentional dictation errors.    Hinda Kehr, MD 10/08/17 [phone]

## 2017-10-08 NOTE — ED Notes (Signed)
Spoke to Dr. Alfred Levins regarding pt care. Pt stating that she doesn't want to stay much longer. Per Dr. Alfred Levins ok to DC at this time.

## 2017-10-08 NOTE — Discharge Instructions (Signed)

## 2017-10-08 NOTE — ED Notes (Signed)
Patient states she has h/o panic attacks, last panic attack felt very similar to how she feels now. States she has been very stressed lately.

## 2017-10-08 NOTE — ED Triage Notes (Signed)
Pt in with cp, left arm pain, nausea, and shob since yest. Hx of CHF unsure of same symptoms.

## 2017-10-08 NOTE — ED Provider Notes (Addendum)
-----------------------------------------   8:16 AM on 10/08/2017 -----------------------------------------   Blood pressure 138/76, pulse (!) 108, temperature 98.1 F (36.7 C), temperature source Oral, resp. rate (!) 22, height 5\' 5"  (1.651 m), weight 106.1 kg (234 lb), SpO2 97 %.  Assuming care from Dr. Karma Greaser of [facility] is a 36 y.o. female with a chief complaint of Chest Pain .    Please refer to H&P by previous MD for further details.  The current plan of care is to follow up repeat troponin at 8AM and reassess.   Clinical Course as of Oct 09 911  Wed Oct 08, 2017  9476 Transferring care to Dr. Alfred Levins to follow up second troponin and reassess.  [CF]    Clinical Course User Index [CF] Hinda Kehr, MD     _________________________ 9:10 AM on 10/08/2017 -----------------------------------------  2nd troponin is negative. Patient remains well appearing. She feels tired like she is coming down with something. I feel with a negative work up that patient is safe for discharge at this time. Recommended close f/u with cardiologist Dr. Fletcher Anon and discussed return precautions with patient.  Patient with elevated BG. Is finishing IVF will repeat BG. She has not taken her morning insulin.   _________________________ 10:15 AM on 10/08/2017 ----------------------------------------- Repeat BG unchanged after IVF. Home insulin given will recheck.   _________________________ 12:44 PM on 10/08/2017 ----------------------------------------- BG trending down, patient requesting to be discharged home. I recommended another IVF bolus and insulin to help bring her BG below 300 but patient wishes to go home. Will dc with close f/u with PCP.   Patient was noted to have elevated BG   Alfred Levins, [place], MD 10/08/17 [place], [place], MD 10/08/17 1247

## 2017-10-09 ENCOUNTER — Telehealth: Payer: Self-pay | Admitting: Cardiovascular Disease

## 2017-10-09 NOTE — Telephone Encounter (Signed)
Lmov for patient to call back She was seen in ED on 10/08/17   Will need a sooner appt with Dr Fletcher Anon  Will try again at a later time

## 2017-10-14 ENCOUNTER — Ambulatory Visit: Payer: Medicaid Other | Admitting: Gastroenterology

## 2017-10-18 ENCOUNTER — Encounter: Payer: Self-pay | Admitting: Emergency Medicine

## 2017-10-18 ENCOUNTER — Emergency Department
Admission: EM | Admit: 2017-10-18 | Discharge: 2017-10-18 | Disposition: A | Discharge status: Home / Self Care | Payer: Medicaid Other | Attending: Emergency Medicine | Admitting: Emergency Medicine

## 2017-10-18 ENCOUNTER — Other Ambulatory Visit: Payer: Self-pay

## 2017-10-18 ENCOUNTER — Emergency Department: Payer: Medicaid Other

## 2017-10-18 DIAGNOSIS — Z87891 Personal history of nicotine dependence: Secondary | ICD-10-CM | POA: Insufficient documentation

## 2017-10-18 DIAGNOSIS — R300 Dysuria: Secondary | ICD-10-CM | POA: Insufficient documentation

## 2017-10-18 DIAGNOSIS — R1013 Epigastric pain: Secondary | ICD-10-CM | POA: Diagnosis not present

## 2017-10-18 DIAGNOSIS — Z794 Long term (current) use of insulin: Secondary | ICD-10-CM | POA: Insufficient documentation

## 2017-10-18 DIAGNOSIS — I110 Hypertensive heart disease with heart failure: Secondary | ICD-10-CM | POA: Insufficient documentation

## 2017-10-18 DIAGNOSIS — Z7982 Long term (current) use of aspirin: Secondary | ICD-10-CM | POA: Diagnosis not present

## 2017-10-18 DIAGNOSIS — E119 Type 2 diabetes mellitus without complications: Secondary | ICD-10-CM | POA: Insufficient documentation

## 2017-10-18 DIAGNOSIS — R1010 Upper abdominal pain, unspecified: Secondary | ICD-10-CM | POA: Diagnosis present

## 2017-10-18 DIAGNOSIS — R197 Diarrhea, unspecified: Secondary | ICD-10-CM | POA: Insufficient documentation

## 2017-10-18 DIAGNOSIS — R112 Nausea with vomiting, unspecified: Secondary | ICD-10-CM | POA: Diagnosis not present

## 2017-10-18 DIAGNOSIS — I5022 Chronic systolic (congestive) heart failure: Secondary | ICD-10-CM | POA: Insufficient documentation

## 2017-10-18 DIAGNOSIS — N390 Urinary tract infection, site not specified: Secondary | ICD-10-CM

## 2017-10-18 DIAGNOSIS — Z79899 Other long term (current) drug therapy: Secondary | ICD-10-CM | POA: Diagnosis not present

## 2017-10-18 LAB — COMPREHENSIVE METABOLIC PANEL
ALT: 19 U/L (ref 14–54)
AST: 25 U/L (ref 15–41)
Albumin: 2.7 g/dL — ABNORMAL LOW (ref 3.5–5.0)
Alkaline Phosphatase: 127 U/L — ABNORMAL HIGH (ref 38–126)
Anion gap: 8 (ref 5–15)
BUN: 24 mg/dL — ABNORMAL HIGH (ref 6–20)
CO2: 23 mmol/L (ref 22–32)
Calcium: 8.6 mg/dL — ABNORMAL LOW (ref 8.9–10.3)
Chloride: 106 mmol/L (ref 101–111)
Creatinine, Ser: 1.32 mg/dL — ABNORMAL HIGH (ref 0.44–1.00)
GFR calc Af Amer: 59 mL/min — ABNORMAL LOW (ref >60)
GFR calc non Af Amer: 51 mL/min — ABNORMAL LOW (ref >60)
Glucose, Bld: 180 mg/dL — ABNORMAL HIGH (ref 65–99)
Potassium: 4 mmol/L (ref 3.5–5.1)
Sodium: 137 mmol/L (ref 135–145)
Total Bilirubin: 0.1 mg/dL — ABNORMAL LOW (ref 0.3–1.2)
Total Protein: 7.1 g/dL (ref 6.5–8.1)

## 2017-10-18 LAB — URINALYSIS, COMPLETE (UACMP) WITH MICROSCOPIC
Bilirubin Urine: NEGATIVE
Glucose, UA: >=500 mg/dL — AB
Ketones, ur: NEGATIVE mg/dL
Nitrite: NEGATIVE
Protein, ur: >=300 mg/dL — AB
Specific Gravity, Urine: 1.015 (ref 1.005–1.030)
pH: 5 (ref 5.0–8.0)

## 2017-10-18 LAB — CBC
HCT: 33.8 % — ABNORMAL LOW (ref 35.0–47.0)
Hemoglobin: 10.8 g/dL — ABNORMAL LOW (ref 12.0–16.0)
MCH: 25.8 pg — ABNORMAL LOW (ref 26.0–34.0)
MCHC: 32 g/dL (ref 32.0–36.0)
MCV: 80.8 fL (ref 80.0–100.0)
Platelets: 391 10*3/uL (ref 150–440)
RBC: 4.19 MIL/uL (ref 3.80–5.20)
RDW: 14 % (ref 11.5–14.5)
WBC: 8.4 10*3/uL (ref 3.6–11.0)

## 2017-10-18 LAB — LIPASE, BLOOD: Lipase: 26 U/L (ref 11–51)

## 2017-10-18 MED ORDER — HYDROCODONE-ACETAMINOPHEN 5-325 MG PO TABS
1.0000 | ORAL_TABLET | Freq: Once | ORAL | Status: AC
  Administered 2017-10-18: 1 via ORAL
  Filled 2017-10-18: qty 1

## 2017-10-18 MED ORDER — CEPHALEXIN 500 MG PO CAPS
500.0000 mg | ORAL_CAPSULE | Freq: Once | ORAL | Status: AC
  Administered 2017-10-18: 500 mg via ORAL
  Filled 2017-10-18: qty 1

## 2017-10-18 MED ORDER — TRAMADOL HCL 50 MG PO TABS: 50.0000 mg | ORAL_TABLET | Freq: Four times a day (QID) | ORAL | 0 refills | Status: DC | PRN

## 2017-10-18 MED ORDER — CEPHALEXIN 500 MG PO CAPS: 500.0000 mg | ORAL_CAPSULE | Freq: Two times a day (BID) | ORAL | 0 refills | Status: DC

## 2017-10-18 NOTE — Discharge Instructions (Signed)
As we discussed her ultrasound shows likely fatty liver disease however there is a possibility of underlying mass in the liver please follow-up with your primary care doctor for an MRI of the liver in the near future.  The rest of your workup did show a urinary tract infection, please take your antibiotics as prescribed for their entire course.  Return to the emergency department for any worsening pain, fever, or any other symptom personally concerning to yourself.

## 2017-10-18 NOTE — ED Provider Notes (Signed)
[facility] Emergency Department Provider Note  Time seen: 4:50 PM  I have reviewed the triage vital signs and the nursing notes.   HISTORY  Chief Complaint Abdominal Pain; Emesis; and Diarrhea    HPI [name] [name] is a 36 y.o. female with a past medical history of CHF, MI status post stent, hypertension, hyperlipidemia, presents to the emergency department for upper abdominal pain.  According to the patient she also has a history of gastroparesis, states for the past 3 days she has been experiencing nausea vomiting and diarrhea.  States is fairly typical for her to go through episodes of diarrhea.  Since last night she has been expensing upper abdominal pain that worsens anytime she attempts to eat.  Patient also states moderate dysuria denies any vaginal bleeding or discharge.  Subjective fever at home.  Past Medical History:  Diagnosis Date  . Chronic systolic CHF (congestive heart failure) ([place])    a. echo 03/2015: EF 30-35%, mild concentric LVH, severe HK of inf, inflat, & lat walls, mod MR, mild TR;  b. 04/2015 LV Gram: EF 55-65%.  . Diabetes type 2, controlled ([place])    a. since 2002   . Diverticulosis, sigmoid 07/2016  . Heavy menses    a. H/O IUD - expired in 2014 - remains in place.  . Hyperlipidemia   . Hypertension   . Iron deficiency anemia   . Ischemic cardiomyopathy    a. 03/2015 EF 30-35% post NSTEMI;  b. 04/2015 EF 55-65% on LV gram.  . Kidney disease   . MI (myocardial infarction) ([place]) 03/29/2015  . Obesity   . Renal insufficiency   . Tobacco abuse    a. quit 03/2015.  Marland Kitchen Uterine fibroid   . Vertigo   . Vitamin D deficiency     Patient Active Problem List   Diagnosis Date Noted  . Poorly controlled type 2 diabetes mellitus ([place]) 08/27/2017  . Lung nodule < 6cm on CT 08/12/2017  . Depression, major, single episode, severe ([place]) 08/12/2017  . Chronic abdominal pain 08/03/2017  . Obstructive sleep apnea 05/16/2017  . Neuropathy  01/17/2017  . Edema of both ankles 01/17/2017  . Fatigue 12/16/2016  . Myalgic 10/25/2016  . Vitamin D deficiency 10/25/2016  . Insomnia 10/25/2016  . Urinary tract infection 09/20/2016  . HTN (hypertension) 09/05/2016  . Vertigo 06/21/2016  . Elevated liver function tests 06/05/2016  . Chronic diastolic heart failure ([place]) 03/19/2016  . Tachycardia 03/19/2016  . IBS (irritable bowel syndrome) 12/15/2015  . Proteinuria 08/30/2015  . Coronary artery disease involving native coronary artery of native heart with angina pectoris with documented spasm ([place])   . Angina pectoris ([place])   . Iron deficiency anemia   . Heavy menses   . SOB (shortness of breath) 08/21/2015  . Hyperlipidemia 04/13/2015  . Ischemic cardiomyopathy   . Obesity     Past Surgical History:  Procedure Laterality Date  . APPENDECTOMY    . CARDIAC CATHETERIZATION  4/16   x2 stent [facility]  . MOUTH SURGERY      Prior to Admission medications   Medication Sig Start Date End Date Taking? Authorizing Provider  albuterol (PROVENTIL HFA;VENTOLIN HFA) 108 (90 Base) MCG/ACT inhaler Inhale 2 puffs into the lungs every 6 (six) hours as needed for wheezing or shortness of breath. 02/19/17   Volney American, PA-C  amitriptyline (ELAVIL) 10 MG tablet Take 1-2 tablets (10-20 mg total) by mouth at bedtime. Take 2 tablets at bedtime. Total of 20mg   nightly Patient taking differently: Take 20 mg by mouth at bedtime.  05/15/17   Armbruster, Carlota Raspberry, MD  aspirin EC 81 MG tablet Take 81 mg by mouth daily.    [provider]  benzonatate (TESSALON) 100 MG capsule Take 1 capsule (100 mg total) by mouth 2 (two) times daily as needed for cough. 12/20/16   Park Liter P, DO  Cholecalciferol 2000 units TABS Take 1 tablet by mouth daily.    [provider]  Dulaglutide (TRULICITY) 1.5 SH/7.37YO SOPN Inject 1.5 mg into the skin once a week. 09/29/17   Kathrine Haddock, NP  etonogestrel (NEXPLANON) 68 MG IMPL implant 1 each  by Subdermal route once.    [provider]  ferrous sulfate 325 (65 FE) MG tablet TAKE ONE TABLET BY MOUTH THREE TIMES DAILY WITH MEALS Patient taking differently: take one tablet by mouth once daily 03/03/17   Kathrine Haddock, NP  glimepiride (AMARYL) 4 MG tablet TAKE ONE TABLET BY MOUTH ONCE DAILY 05/02/17   Park Liter P, DO  glucose blood (ACCU-CHEK ACTIVE STRIPS) test strip Use as instructed 03/25/16   Kathrine Haddock, NP  Insulin Glargine (LANTUS SOLOSTAR) 100 UNIT/ML Solostar Pen Inject 60 Units into the skin daily at 10 pm. 10/02/17   Kathrine Haddock, NP  Insulin Glargine-Lixisenatide (SOLIQUA) 100-33 UNT-MCG/ML SOPN Inject 15 Units into the skin daily. 08/27/17   Kathrine Haddock, NP  Insulin Pen Needle 32G X 4 MM MISC 1 Units by Does not apply route every morning. Pen needles 08/27/17   Kathrine Haddock, NP  isosorbide mononitrate (IMDUR) 30 MG 24 hr tablet TAKE 1 TABLET BY MOUTH ONCE DAILY 10/06/17   Wellington Hampshire, MD  loperamide (IMODIUM) 2 MG capsule Take 2 mg by mouth as needed for diarrhea or loose stools.    [provider]  meclizine (ANTIVERT) 32 MG tablet Take 1 tablet (32 mg total) by mouth 3 (three) times daily as needed. Patient taking differently: Take 32 mg by mouth 3 (three) times daily as needed for dizziness or nausea.  06/21/16   Kathrine Haddock, NP  metoCLOPramide (REGLAN) 10 MG tablet Take 1 tablet (10 mg total) by mouth 3 (three) times daily before meals. 06/02/17   Armbruster, Carlota Raspberry, MD  metoprolol tartrate (LOPRESSOR) 50 MG tablet Take 100mg  AM and 50mg  PM 08/18/17   [name] A, FNP  nitroGLYCERIN (NITROSTAT) 0.4 MG SL tablet Place 1 tablet (0.4 mg total) under the tongue every 5 (five) minutes as needed for chest pain. Reported on 02/20/2016 03/17/17   Alisa Graff, FNP  ondansetron (ZOFRAN ODT) 8 MG disintegrating tablet Take 1 tablet (8 mg total) by mouth every 8 (eight) hours as needed for nausea or vomiting. 05/15/17   Armbruster, Carlota Raspberry, MD   pantoprazole (PROTONIX) 40 MG tablet TAKE ONE TABLET BY MOUTH ONCE DAILY AT 6 AM. 04/28/17   Volney American, PA-C  rifaximin (XIFAXAN) 550 MG TABS tablet Take 1 tablet (550 mg total) by mouth 3 (three) times daily. Patient not taking: Reported on 09/29/2017 06/20/17   Yetta Flock, MD  rosuvastatin (CRESTOR) 5 MG tablet TAKE ONE TABLET BY MOUTH ONCE DAILY 06/02/17   Wellington Hampshire, MD  venlafaxine XR (EFFEXOR-XR) 150 MG 24 hr capsule Take 150 mg by mouth daily with breakfast.     [provider]    Allergies  Allergen Reactions  . Lisinopril Anaphylaxis, Itching and Swelling  . Rosemary Oil Anaphylaxis  . Shellfish Allergy Itching and Swelling  .  Metformin And Related Diarrhea, Nausea And Vomiting and Rash    Family History  Problem Relation Age of Onset  . Diabetes Father   . Cancer Maternal Grandmother        lung  . Cancer Maternal Grandfather        prostate  . Diabetes Paternal Grandfather   . Diabetes Paternal Grandmother   . Cancer - Cervical Maternal Aunt     Social History Social History   Tobacco Use  . Smoking status: Former Smoker    Years: 15.00    Last attempt to quit: 03/10/2015    Years since quitting: 2.6  . Smokeless tobacco: Never Used  Substance Use Topics  . Alcohol use: No    Alcohol/week: 0.0 oz  . Drug use: No    Review of Systems Constitutional: Subjective fever Cardiovascular: Negative for chest pain. Respiratory: Negative for shortness of breath. Gastrointestinal: Upper abdominal pain, nausea vomiting and diarrhea Genitourinary: Moderate dysuria.  Negative for discharge or bleeding Musculoskeletal: Negative for back pain. Neurological: Negative for headache All other ROS negative  ____________________________________________   PHYSICAL EXAM:  VITAL SIGNS: ED Triage Vitals [10/18/17 1412]  Enc Vitals Group     BP (!) 154/88     Pulse Rate 90     Resp 18     Temp 99.3 F (37.4 C)     Temp Source Oral      SpO2 100 %     Weight      Height      Head Circumference      Peak Flow      Pain Score      Pain Loc      Pain Edu?      Excl. in [place]?     Constitutional: Alert and oriented. Well appearing and in no distress. Eyes: Normal exam ENT   Head: Normocephalic and atraumatic.   Mouth/Throat: Mucous membranes are moist. Cardiovascular: Normal rate, regular rhythm. No murmur Respiratory: Normal respiratory effort without tachypnea nor retractions. Breath sounds are clear  Gastrointestinal: Soft, moderate epigastric tenderness to palpation, no rebound or guarding.  No distention.  Abdomen otherwise benign Musculoskeletal: Nontender with normal range of motion in all extremities.  Neurologic:  Normal speech and language. No gross focal neurologic deficits  Skin:  Skin is warm, dry and intact.  Psychiatric: Mood and affect are normal.   ____________________________________________    RADIOLOGY  Ultrasound shows fatty liver disease, known to the patient.  ____________________________________________   INITIAL IMPRESSION / ASSESSMENT AND PLAN / ED COURSE  Pertinent labs & imaging results that were available during my care of the patient were reviewed by me and considered in my medical decision making (see chart for details).  Patient presents to the emergency department for upper abdominal pain, worse after eating.  Differential would include known gastroparesis, gastritis, biliary disease, pancreatitis.  On exam patient has moderate epigastric tenderness but an otherwise benign abdomen.  Labs are largely at the patient's baseline with chronic kidney disease and normal white blood cell count.  Normal lipase.  Normal LFTs.  Patient's urinalysis shows too numerous to count white blood cells given her dysuria this is likely a urinary tract infection.  We will place on antibiotics and order a urine culture.  Given the patient's epigastric pain worse with eating we will also obtain right  upper quadrant ultrasound to rule out gallbladder disease.  Patient's discomfort could very likely be due to her underlying gastroparesis.  States she takes Reglan  at home for this.  Patient follows up with [facility] gastroenterology.  Ultrasound shows fatty liver and a possible mass versus fatty liver, discussed/recommended she follow-up with her PCP for MRI.  Overall the patient appears well.  We will discharge home with a short course of pain medication and Keflex to cover for urinary tract infection.  Patient agreeable.  I reviewed the patient's records, largely noncontributory to today's visit.  ____________________________________________   FINAL CLINICAL IMPRESSION(S) / ED DIAGNOSES  Epigastric pain Nausea vomiting diarrhea    Harvest Dark, MD 10/18/17 1902

## 2017-10-18 NOTE — ED Triage Notes (Signed)
Pt to ED c/o epigastric abd pain and N/V/D x 3 days. Pt in NAD at this time. Denies sick contacts.

## 2017-10-20 ENCOUNTER — Ambulatory Visit: Payer: Medicaid Other | Admitting: Family

## 2017-10-20 ENCOUNTER — Telehealth: Payer: Self-pay | Admitting: Family

## 2017-10-20 NOTE — Telephone Encounter (Signed)
Patient did not show for her [facility] appointment on 10/20/17. Will attempt to reschedule.

## 2017-10-26 IMAGING — CR DG CHEST 2V
1 series · 2 of 2 positions shown · non-contrast
Comparison: 03/12/2016

CLINICAL DATA: Sharp right-sided chest pain starting at 8 p.m. last
night. Pain again today. Cough for 3 days.

EXAM:
CHEST  2 VIEW

[Series 1: dg chest 2 view · 0.14mm/px · 2 of 2 slices shown]
[im 1/2]
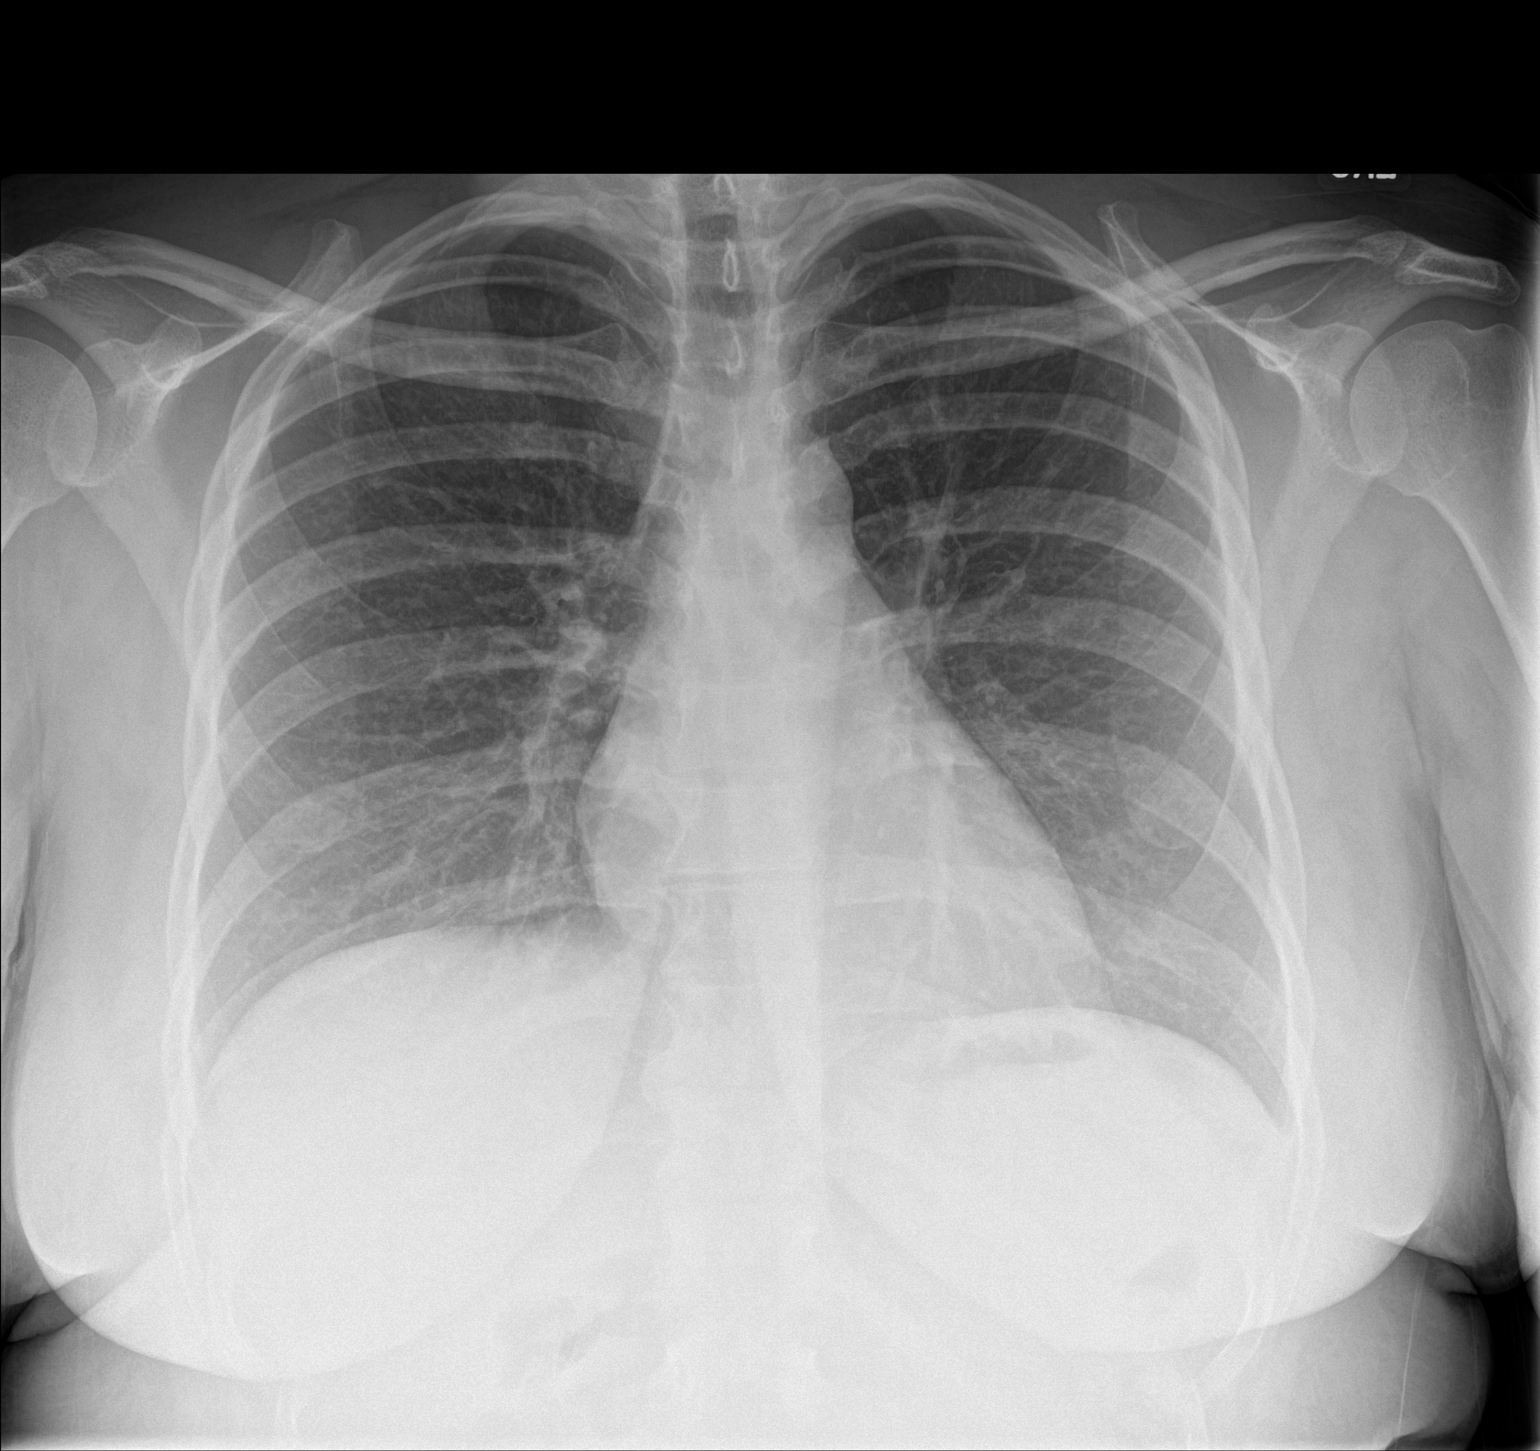
[im 2/2]
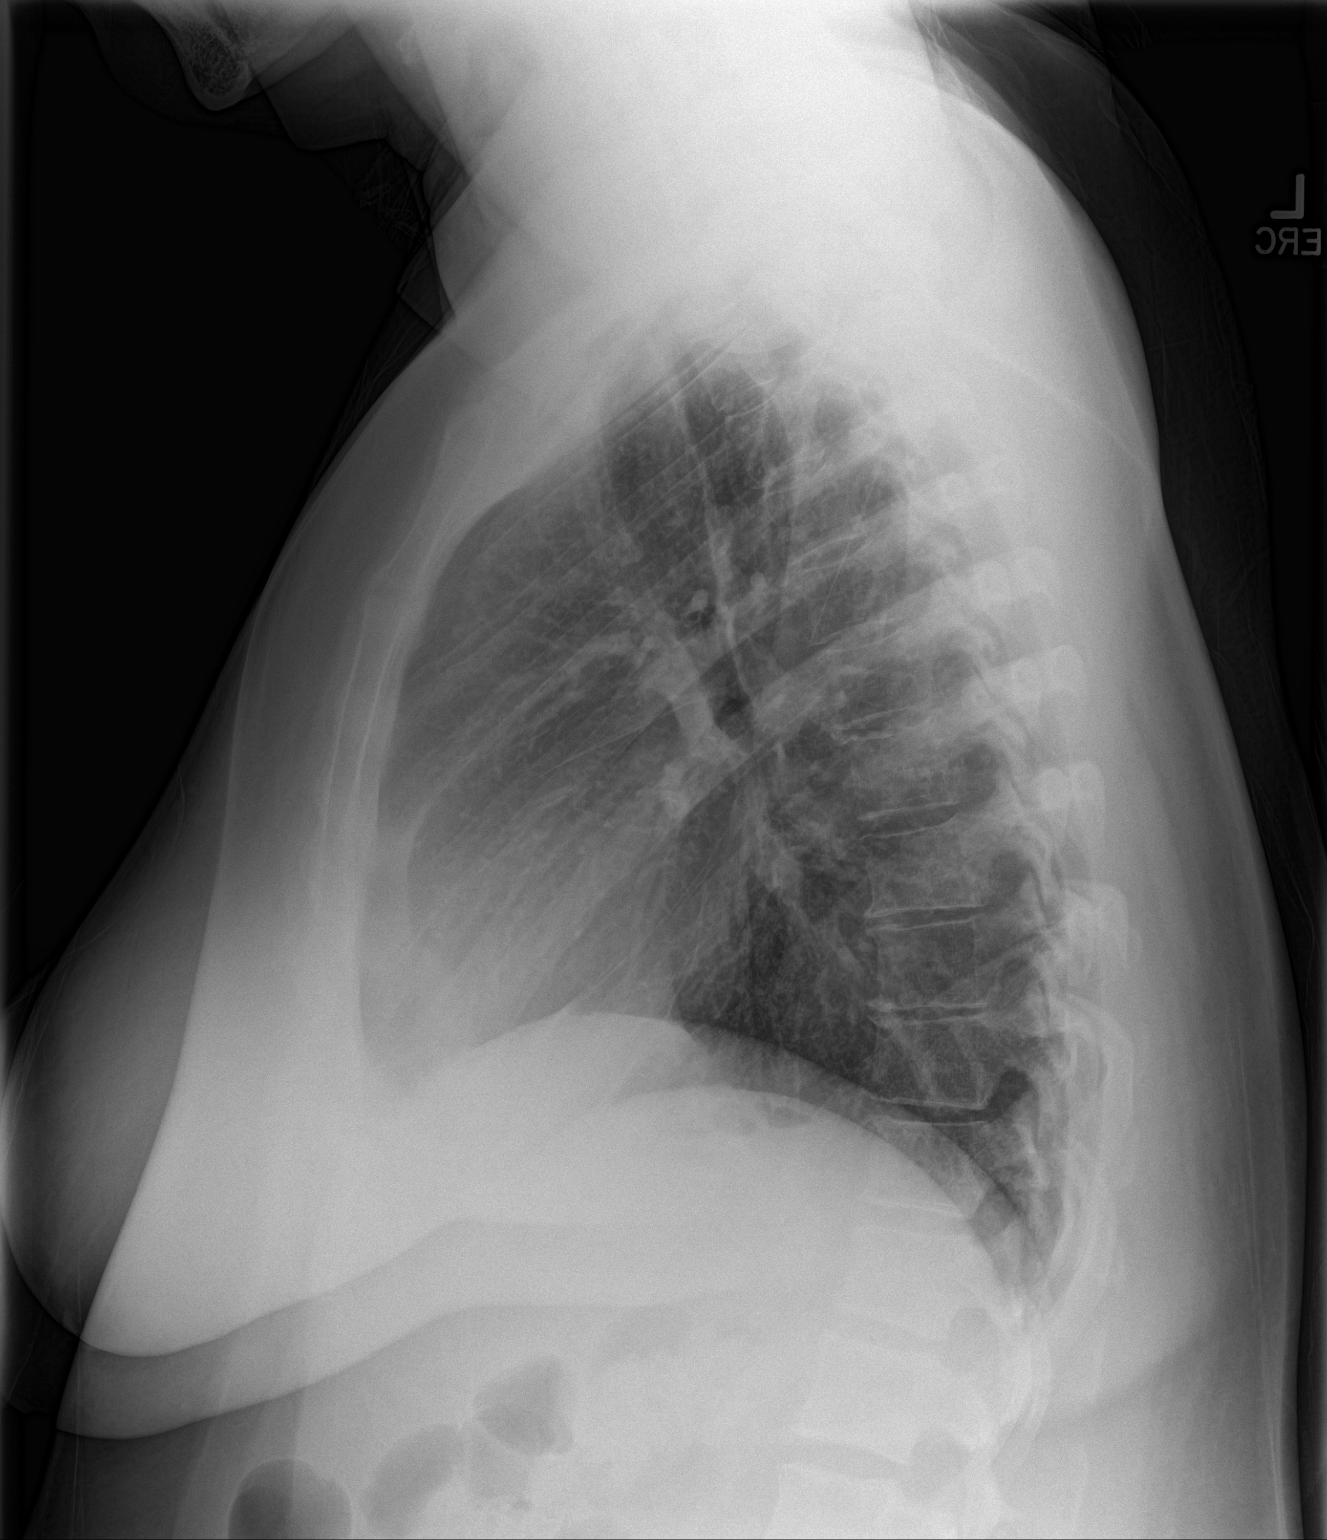

[2 of 2 positions shown; findings below may reference images not displayed]

FINDINGS: The heart size and mediastinal contours are within normal limits.
Both lungs are clear. The visualized skeletal structures are
unremarkable.
IMPRESSION: No active cardiopulmonary disease.

## 2017-10-27 ENCOUNTER — Encounter: Payer: Self-pay | Admitting: Unknown Physician Specialty

## 2017-10-27 ENCOUNTER — Ambulatory Visit (INDEPENDENT_AMBULATORY_CARE_PROVIDER_SITE_OTHER): Payer: Medicaid Other | Admitting: Unknown Physician Specialty

## 2017-10-27 VITALS — BP 136/84 | HR 109 | Temp 98.5°F | Wt 239.2 lb

## 2017-10-27 DIAGNOSIS — R160 Hepatomegaly, not elsewhere classified: Secondary | ICD-10-CM | POA: Diagnosis not present

## 2017-10-27 DIAGNOSIS — K769 Liver disease, unspecified: Secondary | ICD-10-CM | POA: Diagnosis not present

## 2017-10-27 DIAGNOSIS — K3184 Gastroparesis: Secondary | ICD-10-CM | POA: Diagnosis not present

## 2017-10-27 DIAGNOSIS — E1165 Type 2 diabetes mellitus with hyperglycemia: Secondary | ICD-10-CM | POA: Diagnosis not present

## 2017-10-27 LAB — BAYER DCA HB A1C WAIVED: HB A1C (BAYER DCA - WAIVED): 11.1 % — ABNORMAL HIGH (ref <7.0)

## 2017-10-27 MED ORDER — PROMETHAZINE HCL 25 MG PO TABS: 25.0000 mg | ORAL_TABLET | Freq: Three times a day (TID) | ORAL | 2 refills | Status: DC | PRN

## 2017-10-27 NOTE — Assessment & Plan Note (Signed)
Pt with significant diarrhea and vomiting.  Zofran not working.  Will change to Phenerfan

## 2017-10-27 NOTE — Assessment & Plan Note (Addendum)
Hgb A1C is 11.1.  Slightly better than previous but still poorly controlled.  Refer to Endocrine as having significant secondary side effects.  Also having questions if Nexplanon should be removed.  We son't make any changes at this time

## 2017-10-27 NOTE — Progress Notes (Signed)
BP 136/84   Pulse (!) 109   Temp 98.5 F (36.9 C) (Oral)   Wt 239 lb 3.2 oz (108.5 kg)   BMI 39.80 kg/m    Subjective:    Patient ID: [name] [name], female    DOB: 09-22-1981, 36 y.o.   MRN: [id number]  HPI: [name] [name] is a 36 y.o. female  Chief Complaint  Patient presents with  . Diabetes    4 week f/up- pt states last eye exam in chart is correct    Diabetes: Using medications without difficulties hypoglycemic episodes: Blood sugar is sometimes dropping early in the AM to about 69.   No hyperglycemic episodes Feet problems: none Blood Sugars averaging:118-130 eye exam within last year Last Hgb A1C: 11.9  Diarrhea and vomiting This has been going on since yesterday.  States this is painful.  She is following with GI. This is a recurrent problem.  In the ER noted ? Liver mass and needs an MRI  Relevant past medical, surgical, family and social history reviewed and updated as indicated. Interim medical history since our last visit reviewed. Allergies and medications reviewed and updated.  Review of Systems  Constitutional: Positive for fatigue.  Respiratory: Negative.   Genitourinary: Negative.     Per HPI unless specifically indicated above     Objective:    BP 136/84   Pulse (!) 109   Temp 98.5 F (36.9 C) (Oral)   Wt 239 lb 3.2 oz (108.5 kg)   BMI 39.80 kg/m   Wt Readings from Last 3 Encounters:  10/27/17 239 lb 3.2 oz (108.5 kg)  10/08/17 234 lb (106.1 kg)  09/29/17 236 lb 9.6 oz (107.3 kg)    Physical Exam  Constitutional: She is oriented to person, place, and time. She appears well-developed and well-nourished. No distress.  HENT:  Head: Normocephalic and atraumatic.  Eyes: Conjunctivae and lids are normal. Right eye exhibits no discharge. Left eye exhibits no discharge. No scleral icterus.  Neck: Normal range of motion. Neck supple. No JVD present. Carotid bruit is not present.  Cardiovascular: Normal rate, regular rhythm and normal heart  sounds.  Pulmonary/Chest: Effort normal and breath sounds normal.  Abdominal: Normal appearance. There is no splenomegaly or hepatomegaly.  Musculoskeletal: Normal range of motion.  Neurological: She is alert and oriented to person, place, and time.  Skin: Skin is warm, dry and intact. No rash noted. No pallor.  Psychiatric: She has a normal mood and affect. Her behavior is normal. Judgment and thought content normal.    Results for orders placed or performed during the hospital encounter of 10/18/17  Lipase, blood  Result Value Ref Range   Lipase 26 11 - 51 U/L  Comprehensive metabolic panel  Result Value Ref Range   Sodium 137 135 - 145 mmol/L   Potassium 4.0 3.5 - 5.1 mmol/L   Chloride 106 101 - 111 mmol/L   CO2 23 22 - 32 mmol/L   Glucose, Bld 180 (H) 65 - 99 mg/dL   BUN 24 (H) 6 - 20 mg/dL   Creatinine, Ser 1.32 (H) 0.44 - 1.00 mg/dL   Calcium 8.6 (L) 8.9 - 10.3 mg/dL   Total Protein 7.1 6.5 - 8.1 g/dL   Albumin 2.7 (L) 3.5 - 5.0 g/dL   AST 25 15 - 41 U/L   ALT 19 14 - 54 U/L   Alkaline Phosphatase 127 (H) 38 - 126 U/L   Total Bilirubin 0.1 (L) 0.3 - 1.2 mg/dL   GFR calc  non Af Amer 51 (L) >60 mL/min   GFR calc Af Amer 59 (L) >60 mL/min   Anion gap 8 5 - 15  CBC  Result Value Ref Range   WBC 8.4 3.6 - 11.0 K/uL   RBC 4.19 3.80 - 5.20 MIL/uL   Hemoglobin 10.8 (L) 12.0 - 16.0 g/dL   HCT 33.8 (L) 35.0 - 47.0 %   MCV 80.8 80.0 - 100.0 fL   MCH 25.8 (L) 26.0 - 34.0 pg   MCHC 32.0 32.0 - 36.0 g/dL   RDW 14.0 11.5 - 14.5 %   Platelets 391 150 - 440 K/uL  Urinalysis, Complete w Microscopic  Result Value Ref Range   Color, Urine YELLOW (A) YELLOW   APPearance HAZY (A) CLEAR   Specific Gravity, Urine 1.015 1.005 - 1.030   pH 5.0 5.0 - 8.0   Glucose, UA >=500 (A) NEGATIVE mg/dL   Hgb urine dipstick MODERATE (A) NEGATIVE   Bilirubin Urine NEGATIVE NEGATIVE   Ketones, ur NEGATIVE NEGATIVE mg/dL   Protein, ur >=300 (A) NEGATIVE mg/dL   Nitrite NEGATIVE NEGATIVE    Leukocytes, UA TRACE (A) NEGATIVE   RBC / HPF 6-30 0 - 5 RBC/hpf   WBC, UA TOO NUMEROUS TO COUNT 0 - 5 WBC/hpf   Bacteria, UA RARE (A) NONE SEEN   Squamous Epithelial / LPF 0-5 (A) NONE SEEN   Mucus PRESENT    Granular Casts, UA PRESENT       Assessment & Plan:   Problem List Items Addressed This Visit      Unprioritized   Gastroparesis    Pt with significant diarrhea and vomiting.  Zofran not working.  Will change to Phenerfan      Poorly controlled type 2 diabetes mellitus ([place]) - Primary    Hgb A1C is 11.1.  Slightly better than previous but still poorly controlled.  Refer to Endocrine as having significant secondary side effects.  Also having questions if Nexplanon should be removed.  We son't make any changes at this time      Relevant Orders   [facility] [place] Hb A1c Waived   Ambulatory referral to Endocrinology    Other Visit Diagnoses    Liver disease       NASH.  Reviewed last ultrasound   Relevant Orders   MR Abdomen W Wo Contrast   Liver mass       On last [place].  May be related to NASH.  MRI ordered       Follow up plan: Return in about 3 months (around 01/27/2018).

## 2017-11-04 ENCOUNTER — Telehealth: Payer: Self-pay | Admitting: Unknown Physician Specialty

## 2017-11-04 ENCOUNTER — Ambulatory Visit
Admission: RE | Admit: 2017-11-04 | Discharge: 2017-11-04 | Disposition: A | Discharge status: Home / Self Care | Payer: Medicaid Other | Source: Ambulatory Visit | Attending: Unknown Physician Specialty | Admitting: Unknown Physician Specialty

## 2017-11-04 DIAGNOSIS — K769 Liver disease, unspecified: Secondary | ICD-10-CM | POA: Insufficient documentation

## 2017-11-04 DIAGNOSIS — K7689 Other specified diseases of liver: Secondary | ICD-10-CM | POA: Insufficient documentation

## 2017-11-04 MED ORDER — GADOBENATE DIMEGLUMINE 529 MG/ML IV SOLN
20.0000 mL | Freq: Once | INTRAVENOUS | Status: AC | PRN
  Administered 2017-11-04: 20 mL via INTRAVENOUS

## 2017-11-04 NOTE — Telephone Encounter (Signed)
Done

## 2017-11-04 NOTE — Telephone Encounter (Signed)
Copied from [place] [id number]. Topic: Quick Communication - See Telephone Encounter >> Nov 04, 2017 12:04 PM [place], Laban Emperor wrote: CRM for notification. See Telephone encounter for:  Nate with [facility] clinic states a referral was sent over but they need the last A1C test results faxed over.  Fax: [phone], Attn: Nate B. 11/04/17.

## 2017-11-12 ENCOUNTER — Ambulatory Visit: Payer: Medicaid Other | Admitting: Unknown Physician Specialty

## 2017-11-14 ENCOUNTER — Ambulatory Visit: Payer: Medicaid Other | Admitting: Cardiovascular Disease

## 2017-11-14 NOTE — Progress Notes (Deleted)
Cardiology Office Note   Date:  11/14/2017   ID:  [name] [name], [name] [name] 11/14/1981, MRN [id number]  PCP:  Kathrine Haddock, NP  Cardiologist:   Kathlyn Sacramento, MD   No chief complaint on file.     History of Present Illness: [name] [name] is a 36 y.o. female who presents for a follow-up visit regarding coronary artery disease and chronic systolic heart failure. She has prolonged history of type 2 diabetes since 2002 , prolonged history of tobacco use, and obesity.  She presented to [facility] in April of 2016 with NSTEMI. Echocardiogram showed an ejection fraction of 30% with severe inferior, inferolateral and lateral wall hypokinesis with moderate mitral regurgitation. Cardiac catheterization showed occluded mid left circumflex with significant disease affecting the proximal OM1. There was mild disease affecting the LAD and minor irregularities in the RCA. Ejection fraction was 35%. I performed successful angioplasty and drug-eluting stent placement to the mid LCX and proximal OM . She underwent a repeat cardiac catheterization in May 2016 which showed patent stents in the left circumflex and OM1. Ejection fraction was 50-55%.  Most recent echocardiogram in October 2017 showed normal LV systolic function with an EF of 55-60%. She had sinus tachycardia that improved after switching carvedilol to metoprolol.  She reports recent episode of chest pain described as sharp stabbing discomfort which happens at rest and frequently wakes her up from sleep. She does complain of increased burning sensation and heartburn symptoms. She was hospitalized recently at [facility] with abdominal pain with negative CT scan of the abdomen. She is supposed to follow-up with GI in the near future.   Past Medical History:  Diagnosis Date  . Chronic systolic CHF (congestive heart failure) ([place])    a. echo 03/2015: EF 30-35%, mild concentric LVH, severe HK of inf, inflat, & lat walls, mod MR, mild TR;  b. 04/2015 LV Gram: EF  55-65%.  . Diabetes type 2, controlled ([place])    a. since 2002   . Diverticulosis, sigmoid 07/2016  . Heavy menses    a. H/O IUD - expired in 2014 - remains in place.  . Hyperlipidemia   . Hypertension   . Iron deficiency anemia   . Ischemic cardiomyopathy    a. 03/2015 EF 30-35% post NSTEMI;  b. 04/2015 EF 55-65% on LV gram.  . Kidney disease   . MI (myocardial infarction) ([place]) 03/29/2015  . Obesity   . Renal insufficiency   . Tobacco abuse    a. quit 03/2015.  Marland Kitchen Uterine fibroid   . Vertigo   . Vitamin D deficiency     Past Surgical History:  Procedure Laterality Date  . APPENDECTOMY    . CARDIAC CATHETERIZATION  4/16   x2 stent [facility]  . CARDIAC CATHETERIZATION N/A 05/05/2015   Procedure: Left Heart Cath and Coronary Angiography;  Surgeon: Peter M Martinique, MD;  Location: [place] CV LAB;  Service: Cardiovascular;  Laterality: N/A;  . COLONOSCOPY WITH PROPOFOL N/A 07/31/2016   Procedure: COLONOSCOPY WITH PROPOFOL;  Surgeon: Lollie Sails, MD;  Location: [facility] ENDOSCOPY;  Service: Endoscopy;  Laterality: N/A;  . ESOPHAGOGASTRODUODENOSCOPY (EGD) WITH PROPOFOL N/A 07/31/2016   Procedure: ESOPHAGOGASTRODUODENOSCOPY (EGD) WITH PROPOFOL;  Surgeon: Lollie Sails, MD;  Location: [facility] ENDOSCOPY;  Service: Endoscopy;  Laterality: N/A;  . LAPAROSCOPIC APPENDECTOMY N/A 08/07/2016   Procedure: APPENDECTOMY LAPAROSCOPIC;  Surgeon: Hubbard Robinson, MD;  Location: [facility] ORS;  Service: General;  Laterality: N/A;  . MOUTH SURGERY       Current  Outpatient Medications  Medication Sig Dispense Refill  . albuterol (PROVENTIL HFA;VENTOLIN HFA) 108 (90 Base) MCG/ACT inhaler Inhale 2 puffs into the lungs every 6 (six) hours as needed for wheezing or shortness of breath. 1 Inhaler 2  . amitriptyline (ELAVIL) 10 MG tablet Take 1-2 tablets (10-20 mg total) by mouth at bedtime. Take 2 tablets at bedtime. Total of 20mg  nightly (Patient taking differently: Take 30 mg at bedtime by mouth. ) 30 tablet 3    . aspirin EC 81 MG tablet Take 81 mg by mouth daily.    . benzonatate (TESSALON) 100 MG capsule Take 1 capsule (100 mg total) by mouth 2 (two) times daily as needed for cough. 30 capsule 12  . cephALEXin (KEFLEX) 500 MG capsule Take 1 capsule (500 mg total) 2 (two) times daily by mouth. 20 capsule 0  . Cholecalciferol 2000 units TABS Take 1 tablet by mouth daily.    . Dulaglutide (TRULICITY) 1.5 SW/9.6PR SOPN Inject 1.5 mg into the skin once a week. 4 pen 4  . etonogestrel (NEXPLANON) 68 MG IMPL implant 1 each by Subdermal route once.    . ferrous sulfate 325 (65 FE) MG tablet TAKE ONE TABLET BY MOUTH THREE TIMES DAILY WITH MEALS (Patient taking differently: take one tablet by mouth once daily) 90 tablet 3  . glimepiride (AMARYL) 4 MG tablet TAKE ONE TABLET BY MOUTH ONCE DAILY 90 tablet 1  . glucose blood (ACCU-CHEK ACTIVE STRIPS) test strip Use as instructed 100 each 12  . Insulin Glargine (LANTUS SOLOSTAR) 100 UNIT/ML Solostar Pen Inject 60 Units into the skin daily at 10 pm. 4 pen 12  . Insulin Pen Needle 32G X 4 MM MISC 1 Units by Does not apply route every morning. Pen needles 90 each 3  . isosorbide mononitrate (IMDUR) 30 MG 24 hr tablet TAKE 1 TABLET BY MOUTH ONCE DAILY 90 tablet 1  . loperamide (IMODIUM) 2 MG capsule Take 2 mg by mouth as needed for diarrhea or loose stools.    . meclizine (ANTIVERT) 32 MG tablet Take 1 tablet (32 mg total) by mouth 3 (three) times daily as needed. (Patient taking differently: Take 32 mg by mouth 3 (three) times daily as needed for dizziness or nausea. ) 30 tablet 0  . metoCLOPramide (REGLAN) 10 MG tablet Take 1 tablet (10 mg total) by mouth 3 (three) times daily before meals. 90 tablet 2  . metoprolol tartrate (LOPRESSOR) 50 MG tablet Take 100mg  AM and 50mg  PM 180 tablet 0  . nitroGLYCERIN (NITROSTAT) 0.4 MG SL tablet Place 1 tablet (0.4 mg total) under the tongue every 5 (five) minutes as needed for chest pain. Reported on 02/20/2016 50 tablet 3  .  ondansetron (ZOFRAN ODT) 8 MG disintegrating tablet Take 1 tablet (8 mg total) by mouth every 8 (eight) hours as needed for nausea or vomiting. 90 tablet 1  . pantoprazole (PROTONIX) 40 MG tablet TAKE ONE TABLET BY MOUTH ONCE DAILY AT 6 AM. 90 tablet 1  . promethazine (PHENERGAN) 25 MG tablet Take 1 tablet (25 mg total) every 8 (eight) hours as needed by mouth for nausea or vomiting. 30 tablet 2  . rifaximin (XIFAXAN) 550 MG TABS tablet Take 1 tablet (550 mg total) by mouth 3 (three) times daily. (Patient not taking: Reported on 09/29/2017) 42 tablet 0  . rosuvastatin (CRESTOR) 5 MG tablet TAKE ONE TABLET BY MOUTH ONCE DAILY 30 tablet 3  . traMADol (ULTRAM) 50 MG tablet Take 1 tablet (50 mg total) every  6 (six) hours as needed by mouth. 10 tablet 0  . venlafaxine XR (EFFEXOR-XR) 150 MG 24 hr capsule Take 150 mg by mouth daily with breakfast.      No current facility-administered medications for this visit.     Allergies:   Lisinopril; Rosemary oil; Shellfish allergy; and Metformin and related    Social History:  The patient  reports that she quit smoking about 2 years ago. She quit after 15.00 years of use. she has never used smokeless tobacco. She reports that she does not drink alcohol or use drugs.   Family History:  The patient's family history includes Cancer in her maternal grandfather and maternal grandmother; Cancer - Cervical in her maternal aunt; Diabetes in her father, paternal grandfather, and paternal grandmother.    ROS:  Please see the history of present illness.   Otherwise, review of systems are positive for none.   All other systems are reviewed and negative.    PHYSICAL EXAM: VS:  There were no vitals taken for this visit. , BMI There is no height or weight on file to calculate BMI. GEN: Well nourished, well developed, in no acute distress  HEENT: normal  Neck: no JVD, carotid bruits, or masses Cardiac:Mildly tachycardic; no murmurs, rubs, or gallops,no edema    Respiratory:  clear to auscultation bilaterally, normal work of breathing GI: soft, nontender, nondistended, + BS MS: no deformity or atrophy  Skin: warm and dry, no rash Neuro:  Strength and sensation are intact Psych: euthymic mood, full affect   EKG:  EKG is  ordered today. EKG showed normal sinus rhythm with no significant ST or T wave changes. Low voltage.   Recent Labs: 08/12/2017: TSH 2.220 10/08/2017: B Natriuretic Peptide 34.0 10/18/2017: ALT 19; BUN 24; Creatinine, Ser 1.32; Hemoglobin 10.8; Platelets 391; Potassium 4.0; Sodium 137    Lipid Panel    Component Value Date/Time   CHOL 138 01/10/2017 0916   CHOL 106 08/30/2015 1439   CHOL 133 03/31/2015 0225   TRIG 84 01/10/2017 0916   TRIG 46 08/30/2015 1439   TRIG 77 03/31/2015 0225   HDL 39 (L) 01/10/2017 0916   HDL 26 (L) 03/31/2015 0225   CHOLHDL 3.5 01/10/2017 0916   VLDL 9 08/30/2015 1439   VLDL 15 03/31/2015 0225   LDLCALC 82 01/10/2017 0916   LDLCALC 92 03/31/2015 0225      Wt Readings from Last 3 Encounters:  10/27/17 239 lb 3.2 oz (108.5 kg)  10/08/17 234 lb (106.1 kg)  09/29/17 236 lb 9.6 oz (107.3 kg)        ASSESSMENT AND PLAN:  1. Coronary artery disease involving native coronary arteries with stable angina:    Current chest pain is different from her prior angina and could be GI in nature. No need for stress testing but I am going to obtain an echocardiogram. I encouraged her to keep her follow-up appointment with GI.  3. Chronic systolic heart failure: Ejection fraction improved to normal with most recent ejection fraction of 55-60%.  No need for diuretics.  Recheck EF.   4. Essential hypertension: Blood pressure is well controlled on current medications.  4. Hyperlipidemia:    Continue small dose rosuvastatin but We should consider increasing the dose if she can tolerate.  Disposition:   FU with me in 3 months  Signed,  Kathlyn Sacramento, MD  11/14/2017 8:17 AM    [place]

## 2017-11-19 ENCOUNTER — Encounter: Payer: Self-pay | Admitting: Cardiovascular Disease

## 2017-11-27 ENCOUNTER — Encounter: Payer: Self-pay | Admitting: Gastroenterology

## 2017-11-28 ENCOUNTER — Encounter: Payer: Self-pay | Admitting: Gastroenterology

## 2017-12-11 ENCOUNTER — Encounter: Payer: Self-pay | Admitting: Unknown Physician Specialty

## 2017-12-11 ENCOUNTER — Encounter: Payer: Self-pay | Admitting: Gastroenterology

## 2017-12-12 ENCOUNTER — Encounter: Payer: Self-pay | Admitting: Gastroenterology

## 2017-12-17 ENCOUNTER — Telehealth: Payer: Self-pay

## 2017-12-17 ENCOUNTER — Encounter: Payer: Self-pay | Admitting: Unknown Physician Specialty

## 2017-12-17 MED ORDER — PANTOPRAZOLE SODIUM 40 MG PO TBEC: DELAYED_RELEASE_TABLET | ORAL | 1 refills | Status: DC

## 2017-12-17 MED ORDER — GLIMEPIRIDE 4 MG PO TABS: 4.0000 mg | ORAL_TABLET | Freq: Every day | ORAL | 1 refills | Status: DC

## 2017-12-17 NOTE — Telephone Encounter (Signed)
Faxed referral to [facility] for patient to establish with new GI doctor/group as she is moving to this area.

## 2017-12-22 ENCOUNTER — Emergency Department
Admission: EM | Admit: 2017-12-22 | Discharge: 2017-12-22 | Disposition: A | Discharge status: Home / Self Care | Payer: Medicaid Other | Attending: Emergency Medicine | Admitting: Emergency Medicine

## 2017-12-22 ENCOUNTER — Other Ambulatory Visit: Payer: Self-pay

## 2017-12-22 DIAGNOSIS — R1084 Generalized abdominal pain: Secondary | ICD-10-CM | POA: Diagnosis not present

## 2017-12-22 DIAGNOSIS — Z7982 Long term (current) use of aspirin: Secondary | ICD-10-CM | POA: Diagnosis not present

## 2017-12-22 DIAGNOSIS — K3184 Gastroparesis: Secondary | ICD-10-CM

## 2017-12-22 DIAGNOSIS — I5042 Chronic combined systolic (congestive) and diastolic (congestive) heart failure: Secondary | ICD-10-CM | POA: Insufficient documentation

## 2017-12-22 DIAGNOSIS — I252 Old myocardial infarction: Secondary | ICD-10-CM | POA: Diagnosis not present

## 2017-12-22 DIAGNOSIS — N3000 Acute cystitis without hematuria: Secondary | ICD-10-CM | POA: Diagnosis not present

## 2017-12-22 DIAGNOSIS — Z87891 Personal history of nicotine dependence: Secondary | ICD-10-CM | POA: Diagnosis not present

## 2017-12-22 DIAGNOSIS — I110 Hypertensive heart disease with heart failure: Secondary | ICD-10-CM | POA: Diagnosis not present

## 2017-12-22 DIAGNOSIS — Z79899 Other long term (current) drug therapy: Secondary | ICD-10-CM | POA: Insufficient documentation

## 2017-12-22 DIAGNOSIS — Z794 Long term (current) use of insulin: Secondary | ICD-10-CM | POA: Insufficient documentation

## 2017-12-22 DIAGNOSIS — R197 Diarrhea, unspecified: Secondary | ICD-10-CM

## 2017-12-22 DIAGNOSIS — E1143 Type 2 diabetes mellitus with diabetic autonomic (poly)neuropathy: Secondary | ICD-10-CM | POA: Insufficient documentation

## 2017-12-22 DIAGNOSIS — I2510 Atherosclerotic heart disease of native coronary artery without angina pectoris: Secondary | ICD-10-CM | POA: Diagnosis not present

## 2017-12-22 DIAGNOSIS — R112 Nausea with vomiting, unspecified: Secondary | ICD-10-CM | POA: Insufficient documentation

## 2017-12-22 LAB — URINALYSIS, COMPLETE (UACMP) WITH MICROSCOPIC
Bilirubin Urine: NEGATIVE
Glucose, UA: >=500 mg/dL — AB
Ketones, ur: NEGATIVE mg/dL
Nitrite: NEGATIVE
Protein, ur: 100 mg/dL — AB
Specific Gravity, Urine: 1.018 (ref 1.005–1.030)
pH: 5 (ref 5.0–8.0)

## 2017-12-22 LAB — COMPREHENSIVE METABOLIC PANEL
ALT: 12 U/L — ABNORMAL LOW (ref 14–54)
AST: 18 U/L (ref 15–41)
Albumin: 3 g/dL — ABNORMAL LOW (ref 3.5–5.0)
Alkaline Phosphatase: 147 U/L — ABNORMAL HIGH (ref 38–126)
Anion gap: 9 (ref 5–15)
BUN: 28 mg/dL — ABNORMAL HIGH (ref 6–20)
CO2: 22 mmol/L (ref 22–32)
Calcium: 8.8 mg/dL — ABNORMAL LOW (ref 8.9–10.3)
Chloride: 107 mmol/L (ref 101–111)
Creatinine, Ser: 1.47 mg/dL — ABNORMAL HIGH (ref 0.44–1.00)
GFR calc Af Amer: 52 mL/min — ABNORMAL LOW (ref >60)
GFR calc non Af Amer: 45 mL/min — ABNORMAL LOW (ref >60)
Glucose, Bld: 222 mg/dL — ABNORMAL HIGH (ref 65–99)
Potassium: 3.9 mmol/L (ref 3.5–5.1)
Sodium: 138 mmol/L (ref 135–145)
Total Bilirubin: 0.6 mg/dL (ref 0.3–1.2)
Total Protein: 7.5 g/dL (ref 6.5–8.1)

## 2017-12-22 LAB — CBC
HCT: 37.7 % (ref 35.0–47.0)
Hemoglobin: 12.2 g/dL (ref 12.0–16.0)
MCH: 26 pg (ref 26.0–34.0)
MCHC: 32.2 g/dL (ref 32.0–36.0)
MCV: 80.6 fL (ref 80.0–100.0)
Platelets: 458 10*3/uL — ABNORMAL HIGH (ref 150–440)
RBC: 4.68 MIL/uL (ref 3.80–5.20)
RDW: 14.2 % (ref 11.5–14.5)
WBC: 7.4 10*3/uL (ref 3.6–11.0)

## 2017-12-22 LAB — POCT PREGNANCY, URINE: Preg Test, Ur: NEGATIVE

## 2017-12-22 LAB — LIPASE, BLOOD: Lipase: 28 U/L (ref 11–51)

## 2017-12-22 MED ORDER — CEPHALEXIN 500 MG PO CAPS: 500.0000 mg | ORAL_CAPSULE | Freq: Four times a day (QID) | ORAL | 0 refills | Status: AC

## 2017-12-22 MED ORDER — CEFTRIAXONE SODIUM IN DEXTROSE 20 MG/ML IV SOLN
1.0000 g | Freq: Once | INTRAVENOUS | Status: AC
  Administered 2017-12-22: 1 g via INTRAVENOUS
  Filled 2017-12-22: qty 50

## 2017-12-22 MED ORDER — TRAMADOL HCL 50 MG PO TABS: 50.0000 mg | ORAL_TABLET | Freq: Four times a day (QID) | ORAL | 0 refills | Status: DC | PRN

## 2017-12-22 MED ORDER — MORPHINE SULFATE (PF) 4 MG/ML IV SOLN
4.0000 mg | Freq: Once | INTRAVENOUS | Status: AC
  Administered 2017-12-22: 4 mg via INTRAVENOUS
  Filled 2017-12-22: qty 1

## 2017-12-22 MED ORDER — PROMETHAZINE HCL 25 MG RE SUPP: 25.0000 mg | Freq: Four times a day (QID) | RECTAL | 0 refills | Status: DC | PRN

## 2017-12-22 MED ORDER — METOCLOPRAMIDE HCL 5 MG/ML IJ SOLN
10.0000 mg | Freq: Once | INTRAMUSCULAR | Status: AC
  Administered 2017-12-22: 10 mg via INTRAVENOUS
  Filled 2017-12-22: qty 2

## 2017-12-22 MED ORDER — SODIUM CHLORIDE 0.9 % IV BOLUS (SEPSIS)
1000.0000 mL | Freq: Once | INTRAVENOUS | Status: AC
  Administered 2017-12-22: 1000 mL via INTRAVENOUS

## 2017-12-22 NOTE — ED Notes (Addendum)
Pt reports stabbing pain at umbilicus "on and off since Christmas", slight fever for a couple days with N/V, back pain last week, pt reports multiple treatments for UTIs, hx of HTN, DM, gastroparesis   Pt reports taking meds (phenergan and IBU) earlier at 11pm and vomiting them up later

## 2017-12-22 NOTE — Discharge Instructions (Signed)
Please follow up with your primary care physician.

## 2017-12-22 NOTE — ED Triage Notes (Signed)
Reports mid abdominal pain since Christmas with vomiting.

## 2017-12-22 NOTE — ED Notes (Signed)
Pt awake and alert in waiting room; continues to wait patiently for treatment room; discussed wait times and pt verbalized understanding

## 2017-12-22 NOTE — ED Provider Notes (Addendum)
[facility] Emergency Department Provider Note   ____________________________________________   First MD Initiated Contact with Patient 12/22/17 [phone]     (approximate)  I have reviewed the triage vital signs and the nursing notes.   HISTORY  Chief Complaint Abdominal Pain    HPI [name] [name] is a 37 y.o. female who comes into the hospital today with some mid abdominal pain.  The patient reports that she has had it for the past 3 weeks.  She is also had some nausea vomiting and diarrhea with a slight temperature.  The patient states that she did not see a doctor because she is used to having this.  The patient has a history of gastroparesis.  She has been taking Reglan and Phenergan at home but has not been helping.  The patient states that she ran out of her tramadol.  The patient states that her emesis is brown appearing.  The patient states that her pain is currently a 9 out of 10 in intensity.  She is also had some pain with urination for the past week or so.  The patient is here today for evaluation.   Past Medical History:  Diagnosis Date  . Chronic systolic CHF (congestive heart failure) ([place])    a. echo 03/2015: EF 30-35%, mild concentric LVH, severe HK of inf, inflat, & lat walls, mod MR, mild TR;  b. 04/2015 LV Gram: EF 55-65%.  . Diabetes type 2, controlled ([place])    a. since 2002   . Diverticulosis, sigmoid 07/2016  . Heavy menses    a. H/O IUD - expired in 2014 - remains in place.  . Hyperlipidemia   . Hypertension   . Iron deficiency anemia   . Ischemic cardiomyopathy    a. 03/2015 EF 30-35% post NSTEMI;  b. 04/2015 EF 55-65% on LV gram.  . Kidney disease   . MI (myocardial infarction) ([place]) 03/29/2015  . Obesity   . Renal insufficiency   . Tobacco abuse    a. quit 03/2015.  Marland Kitchen Uterine fibroid   . Vertigo   . Vitamin D deficiency     Patient Active Problem List   Diagnosis Date Noted  . Gastroparesis 10/27/2017  . Poorly controlled  type 2 diabetes mellitus ([place]) 08/27/2017  . Lung nodule < 6cm on CT 08/12/2017  . Depression, major, single episode, severe ([place]) 08/12/2017  . Chronic abdominal pain 08/03/2017  . Obstructive sleep apnea 05/16/2017  . Neuropathy 01/17/2017  . Edema of both ankles 01/17/2017  . Fatigue 12/16/2016  . Myalgic 10/25/2016  . Vitamin D deficiency 10/25/2016  . Insomnia 10/25/2016  . Urinary tract infection 09/20/2016  . HTN (hypertension) 09/05/2016  . Vertigo 06/21/2016  . Elevated liver function tests 06/05/2016  . Chronic diastolic heart failure ([place]) 03/19/2016  . Tachycardia 03/19/2016  . IBS (irritable bowel syndrome) 12/15/2015  . Proteinuria 08/30/2015  . Coronary artery disease involving native coronary artery of native heart with angina pectoris with documented spasm ([place])   . Angina pectoris ([place])   . Iron deficiency anemia   . Heavy menses   . SOB (shortness of breath) 08/21/2015  . Hyperlipidemia 04/13/2015  . Ischemic cardiomyopathy   . Obesity     Past Surgical History:  Procedure Laterality Date  . APPENDECTOMY    . CARDIAC CATHETERIZATION  4/16   x2 stent [facility]  . CARDIAC CATHETERIZATION N/A 05/05/2015   Procedure: Left Heart Cath and Coronary Angiography;  Surgeon: Peter M Martinique, MD;  Location:  [place] INVASIVE CV LAB;  Service: Cardiovascular;  Laterality: N/A;  . COLONOSCOPY WITH PROPOFOL N/A 07/31/2016   Procedure: COLONOSCOPY WITH PROPOFOL;  Surgeon: Lollie Sails, MD;  Location: [facility] ENDOSCOPY;  Service: Endoscopy;  Laterality: N/A;  . ESOPHAGOGASTRODUODENOSCOPY (EGD) WITH PROPOFOL N/A 07/31/2016   Procedure: ESOPHAGOGASTRODUODENOSCOPY (EGD) WITH PROPOFOL;  Surgeon: Lollie Sails, MD;  Location: [facility] ENDOSCOPY;  Service: Endoscopy;  Laterality: N/A;  . LAPAROSCOPIC APPENDECTOMY N/A 08/07/2016   Procedure: APPENDECTOMY LAPAROSCOPIC;  Surgeon: Hubbard Robinson, MD;  Location: [facility] ORS;  Service: General;  Laterality: N/A;  . MOUTH SURGERY      Prior to  Admission medications   Medication Sig Start Date End Date Taking? Authorizing Provider  albuterol (PROVENTIL HFA;VENTOLIN HFA) 108 (90 Base) MCG/ACT inhaler Inhale 2 puffs into the lungs every 6 (six) hours as needed for wheezing or shortness of breath. 02/19/17   Volney American, PA-C  amitriptyline (ELAVIL) 10 MG tablet Take 1-2 tablets (10-20 mg total) by mouth at bedtime. Take 2 tablets at bedtime. Total of 20mg  nightly Patient taking differently: Take 30 mg at bedtime by mouth.  05/15/17   Armbruster, Carlota Raspberry, MD  aspirin EC 81 MG tablet Take 81 mg by mouth daily.    [provider]  benzonatate (TESSALON) 100 MG capsule Take 1 capsule (100 mg total) by mouth 2 (two) times daily as needed for cough. 12/20/16   Johnson, Megan P, DO  cephALEXin (KEFLEX) 500 MG capsule Take 1 capsule (500 mg total) 2 (two) times daily by mouth. 10/18/17   Harvest Dark, MD  cephALEXin (KEFLEX) 500 MG capsule Take 1 capsule (500 mg total) by mouth 4 (four) times daily for 10 days. 12/22/17 01/01/18  Loney Hering, MD  Cholecalciferol 2000 units TABS Take 1 tablet by mouth daily.    [provider]  Dulaglutide (TRULICITY) 1.5 QJ/1.9ER SOPN Inject 1.5 mg into the skin once a week. 09/29/17   Kathrine Haddock, NP  etonogestrel (NEXPLANON) 68 MG IMPL implant 1 each by Subdermal route once.    [provider]  ferrous sulfate 325 (65 FE) MG tablet TAKE ONE TABLET BY MOUTH THREE TIMES DAILY WITH MEALS Patient taking differently: take one tablet by mouth once daily 03/03/17   Kathrine Haddock, NP  glimepiride (AMARYL) 4 MG tablet Take 1 tablet (4 mg total) by mouth daily. 12/17/17   Kathrine Haddock, NP  glucose blood (ACCU-CHEK ACTIVE STRIPS) test strip Use as instructed 03/25/16   Kathrine Haddock, NP  Insulin Glargine (LANTUS SOLOSTAR) 100 UNIT/ML Solostar Pen Inject 60 Units into the skin daily at 10 pm. 10/02/17   Kathrine Haddock, NP  Insulin Pen Needle 32G X 4 MM MISC 1 Units by Does not  apply route every morning. Pen needles 08/27/17   Kathrine Haddock, NP  isosorbide mononitrate (IMDUR) 30 MG 24 hr tablet TAKE 1 TABLET BY MOUTH ONCE DAILY 10/06/17   Wellington Hampshire, MD  loperamide (IMODIUM) 2 MG capsule Take 2 mg by mouth as needed for diarrhea or loose stools.    [provider]  meclizine (ANTIVERT) 32 MG tablet Take 1 tablet (32 mg total) by mouth 3 (three) times daily as needed. Patient taking differently: Take 32 mg by mouth 3 (three) times daily as needed for dizziness or nausea.  06/21/16   Kathrine Haddock, NP  metoCLOPramide (REGLAN) 10 MG tablet Take 1 tablet (10 mg total) by mouth 3 (three) times daily before meals. 06/02/17   Armbruster, Carlota Raspberry, MD  metoprolol tartrate (LOPRESSOR) 50 MG tablet Take 100mg  AM and 50mg  PM 08/18/17   [name] A, FNP  nitroGLYCERIN (NITROSTAT) 0.4 MG SL tablet Place 1 tablet (0.4 mg total) under the tongue every 5 (five) minutes as needed for chest pain. Reported on 02/20/2016 03/17/17   Alisa Graff, FNP  ondansetron (ZOFRAN ODT) 8 MG disintegrating tablet Take 1 tablet (8 mg total) by mouth every 8 (eight) hours as needed for nausea or vomiting. 05/15/17   Armbruster, Carlota Raspberry, MD  pantoprazole (PROTONIX) 40 MG tablet TAKE ONE TABLET BY MOUTH ONCE DAILY AT 6 AM. 12/17/17   Kathrine Haddock, NP  promethazine (PHENERGAN) 25 MG suppository Place 1 suppository (25 mg total) rectally every 6 (six) hours as needed for nausea or vomiting. 12/22/17   Loney Hering, MD  promethazine (PHENERGAN) 25 MG tablet Take 1 tablet (25 mg total) every 8 (eight) hours as needed by mouth for nausea or vomiting. 10/27/17   Kathrine Haddock, NP  rifaximin (XIFAXAN) 550 MG TABS tablet Take 1 tablet (550 mg total) by mouth 3 (three) times daily. Patient not taking: Reported on 09/29/2017 06/20/17   Yetta Flock, MD  rosuvastatin (CRESTOR) 5 MG tablet TAKE ONE TABLET BY MOUTH ONCE DAILY 06/02/17   Wellington Hampshire, MD  traMADol (ULTRAM) 50 MG tablet Take  1 tablet (50 mg total) every 6 (six) hours as needed by mouth. 10/18/17   Harvest Dark, MD  traMADol (ULTRAM) 50 MG tablet Take 1 tablet (50 mg total) by mouth every 6 (six) hours as needed. 12/22/17   Loney Hering, MD  venlafaxine XR (EFFEXOR-XR) 150 MG 24 hr capsule Take 150 mg by mouth daily with breakfast.     [provider]    Allergies Lisinopril; Rosemary oil; Shellfish allergy; and Metformin and related  Family History  Problem Relation Age of Onset  . Diabetes Father   . Cancer Maternal Grandmother        lung  . Cancer Maternal Grandfather        prostate  . Diabetes Paternal Grandfather   . Diabetes Paternal Grandmother   . Cancer - Cervical Maternal Aunt     Social History Social History   Tobacco Use  . Smoking status: Former Smoker    Years: 15.00    Last attempt to quit: 03/10/2015    Years since quitting: 2.7  . Smokeless tobacco: Never Used  Substance Use Topics  . Alcohol use: No    Alcohol/week: 0.0 oz  . Drug use: No    Review of Systems  Constitutional: No fever/chills Eyes: No visual changes. ENT: No sore throat. Cardiovascular: Denies chest pain. Respiratory: Denies shortness of breath. Gastrointestinal:  abdominal pain. nausea, vomiting. diarrhea.  No constipation. Genitourinary: dysuria. Musculoskeletal: Negative for back pain. Skin: Negative for rash. Neurological: Negative for headaches, focal weakness or numbness.   ____________________________________________   PHYSICAL EXAM:  VITAL SIGNS: ED Triage Vitals  Enc Vitals Group     BP 12/22/17 0021 130/88     Pulse Rate 12/22/17 0021 100     Resp 12/22/17 0021 18     Temp 12/22/17 0021 97.9 F (36.6 C)     Temp Source 12/22/17 0021 Oral     SpO2 12/22/17 0021 97 %     Weight 12/22/17 0021 240 lb (108.9 kg)     Height 12/22/17 0021 5\' 4"  (1.626 m)     Head Circumference --      Peak Flow --  Pain Score 12/22/17 0027 8     Pain Loc --      Pain Edu? --       Excl. in [place]? --     Constitutional: Alert and oriented. Well appearing and in moderate distress. Eyes: Conjunctivae are normal. PERRL. EOMI. Head: Atraumatic. Nose: No congestion/rhinnorhea. Mouth/Throat: Mucous membranes are moist.  Oropharynx non-erythematous. Cardiovascular: Normal rate, regular rhythm. Grossly normal heart sounds.  Good peripheral circulation. Respiratory: Normal respiratory effort.  No retractions. Lungs CTAB. Gastrointestinal: Soft with some diffuse middle abdominal tenderness to palpation. No distention.  Diminished bowel sounds throughout Musculoskeletal: No lower extremity tenderness nor edema.   Neurologic:  Normal speech and language.  Skin:  Skin is warm, dry and intact.  Psychiatric: Mood and affect are normal.   ____________________________________________   LABS (all labs ordered are listed, but only abnormal results are displayed)  Labs Reviewed  COMPREHENSIVE METABOLIC PANEL - Abnormal; Notable for the following components:      Result Value   Glucose, Bld 222 (*)    BUN 28 (*)    Creatinine, Ser 1.47 (*)    Calcium 8.8 (*)    Albumin 3.0 (*)    ALT 12 (*)    Alkaline Phosphatase 147 (*)    GFR calc non Af Amer 45 (*)    GFR calc Af Amer 52 (*)    All other components within normal limits  CBC - Abnormal; Notable for the following components:   Platelets 458 (*)    All other components within normal limits  URINALYSIS, COMPLETE (UACMP) WITH MICROSCOPIC - Abnormal; Notable for the following components:   Color, Urine YELLOW (*)    APPearance CLOUDY (*)    Glucose, UA >=500 (*)    Hgb urine dipstick SMALL (*)    Protein, ur 100 (*)    Leukocytes, UA SMALL (*)    Bacteria, UA FEW (*)    Squamous Epithelial / LPF 6-30 (*)    All other components within normal limits  URINE CULTURE  CULTURE, BLOOD (ROUTINE X 2)  CULTURE, BLOOD (ROUTINE X 2)  LIPASE, BLOOD  POC URINE PREG, ED  POCT PREGNANCY, URINE    ____________________________________________  EKG  ED ECG REPORT I, Loney Hering, the attending physician, personally viewed and interpreted this ECG.   Date: 12/22/2017  EKG Time: 0029  Rate: 101  Rhythm: sinus tachycardia  Axis: normal  Intervals:none  ST&T Change: none  ____________________________________________  RADIOLOGY  No results found.  ____________________________________________   PROCEDURES  Procedure(s) performed: None  Procedures  Critical Care performed: No  ____________________________________________   INITIAL IMPRESSION / ASSESSMENT AND PLAN / ED COURSE  As part of my medical decision making, I reviewed the following data within the electronic MEDICAL RECORD NUMBER Notes from prior ED visits and [place] Controlled Substance Database   This is a 37 year old female who comes into the hospital today with some mid abdominal pain.  The patient does have a history of gastroparesis  Differential diagnosis includes gastroparesis, bowel obstruction, gastroenteritis, urinary tract infection  We did check some blood work on the patient to include a CBC CMP and a urinalysis.  The patient also had a lipase.  The patient's urinalysis shows bacteria with too numerous to count white blood cells with a concern for urinary tract infection.  The patient is also had some dysuria.  The patient had her last CT abdomen and pelvis in August 2018 and it was negative.  I will give the  patient some fluid as well as some morphine and Zofran.  I will also give the patient a dose of ceftriaxone to treat her urinary tract infection.  She will be reassessed after she has received the medications.     The patient was feeling improved after receiving the medications.  I did give her some ginger ale.  She reports that she had a little bit of nausea but has not vomited.  She feels comfortable being discharged home.  I will give her a prescription for some Phenergan suppositories,  tramadol and Keflex.  The patient should follow-up with her primary care physician.  She should return with any worsening condition or any other concerns. ____________________________________________   FINAL CLINICAL IMPRESSION(S) / ED DIAGNOSES  Final diagnoses:  Nausea vomiting and diarrhea  Generalized abdominal pain  Acute cystitis without hematuria  Gastroparesis     ED Discharge Orders        Ordered    promethazine (PHENERGAN) 25 MG suppository  Every 6 hours PRN     12/22/17 0848    cephALEXin (KEFLEX) 500 MG capsule  4 times daily     12/22/17 0848    traMADol (ULTRAM) 50 MG tablet  Every 6 hours PRN     12/22/17 0848       Note:  This document was prepared using Dragon voice recognition software and may include unintentional dictation errors.    Loney Hering, MD 12/22/17 0850    Loney Hering, MD 12/22/17 [phone]

## 2017-12-23 LAB — URINE CULTURE

## 2017-12-27 LAB — CULTURE, BLOOD (ROUTINE X 2)
Culture: NO GROWTH
Culture: NO GROWTH
Special Requests: ADEQUATE
Special Requests: ADEQUATE

## 2018-01-03 ENCOUNTER — Encounter: Payer: Self-pay | Admitting: Unknown Physician Specialty

## 2018-01-07 ENCOUNTER — Other Ambulatory Visit: Payer: Self-pay | Admitting: Cardiovascular Disease

## 2018-01-07 ENCOUNTER — Encounter: Payer: Self-pay | Admitting: Unknown Physician Specialty

## 2018-01-07 IMAGING — CR DG CHEST 2V
2 series · 2 of 2 positions shown · non-contrast
Comparison: 04/19/2016

CLINICAL DATA: Productive cough, back pain

EXAM:
CHEST  2 VIEW

[chest pa]
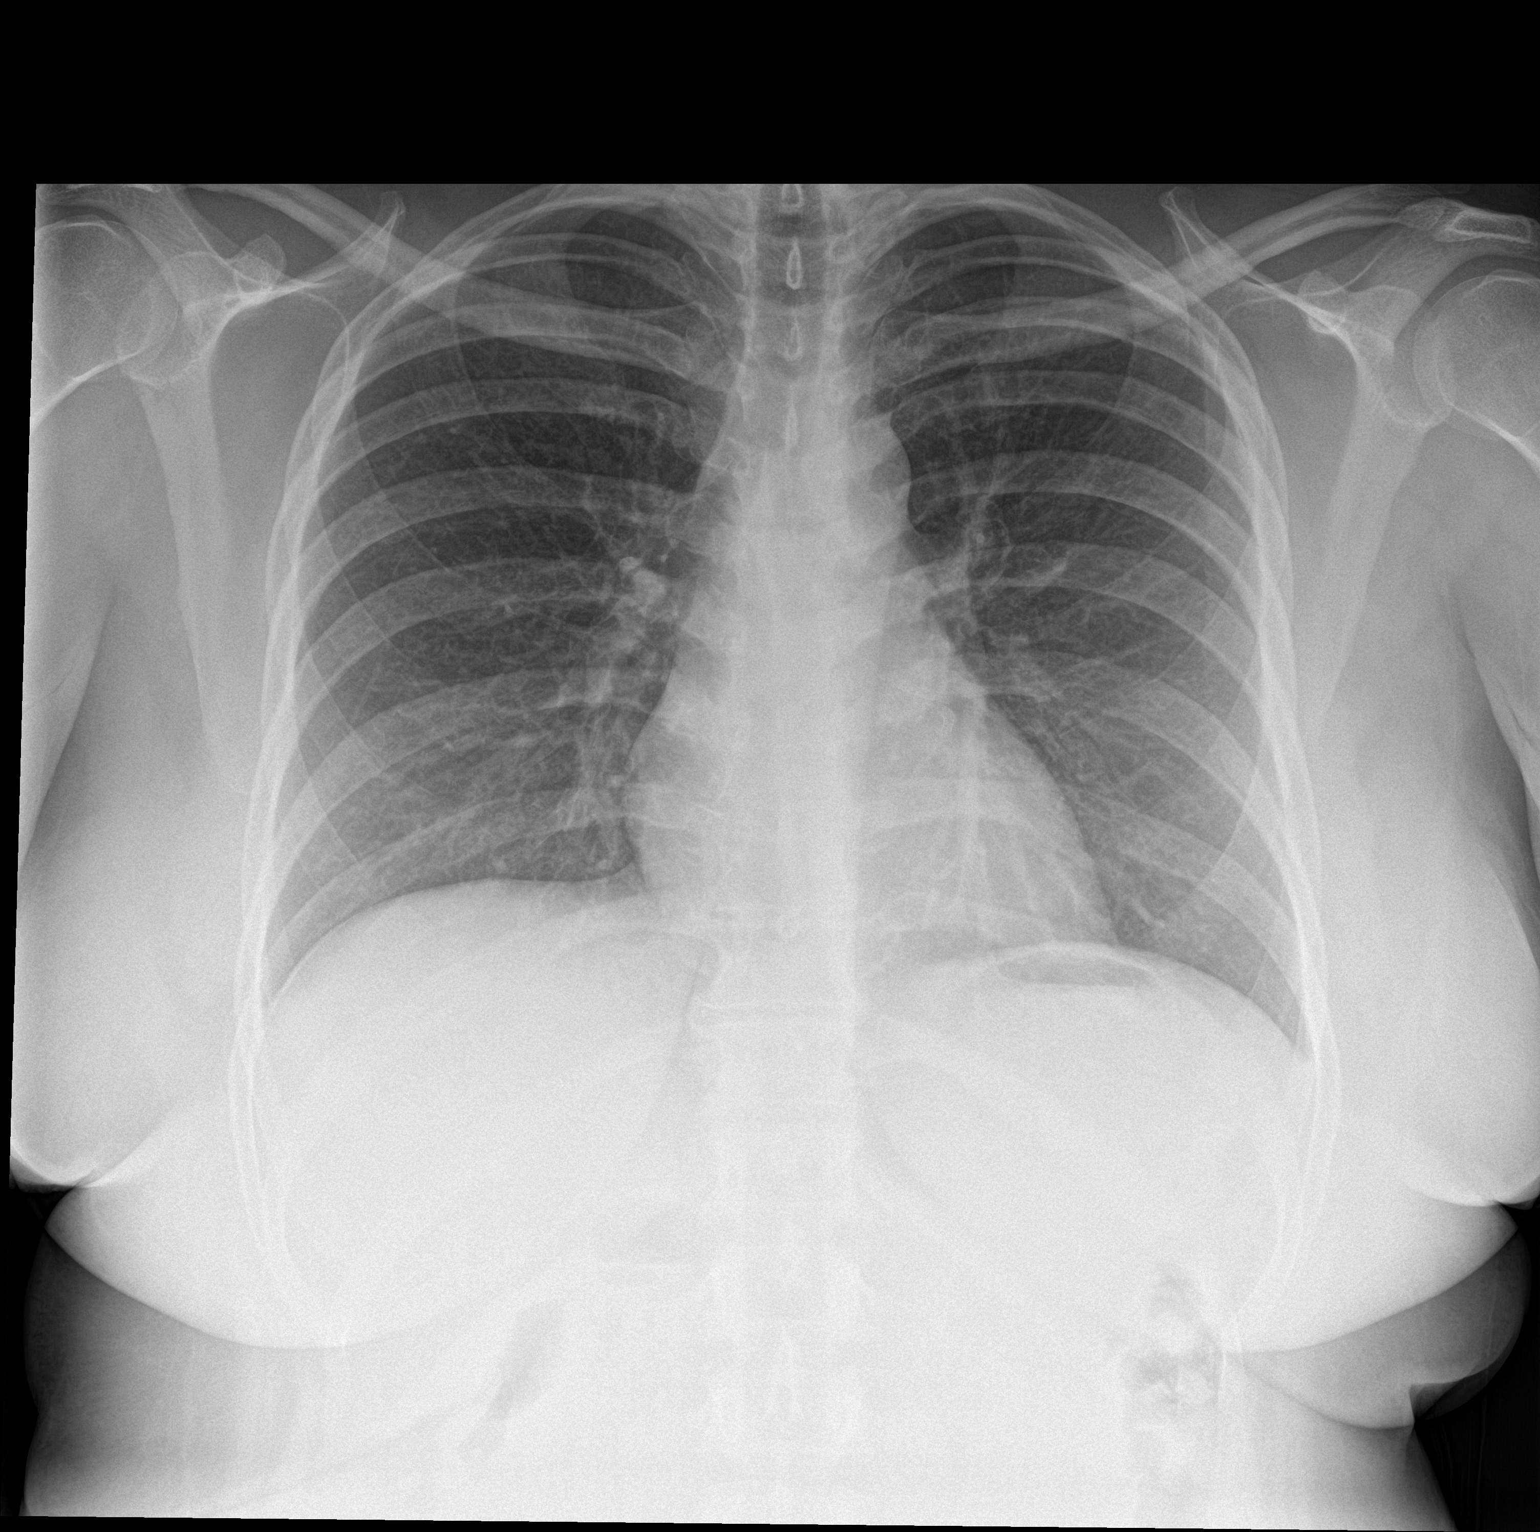

[chest lat]
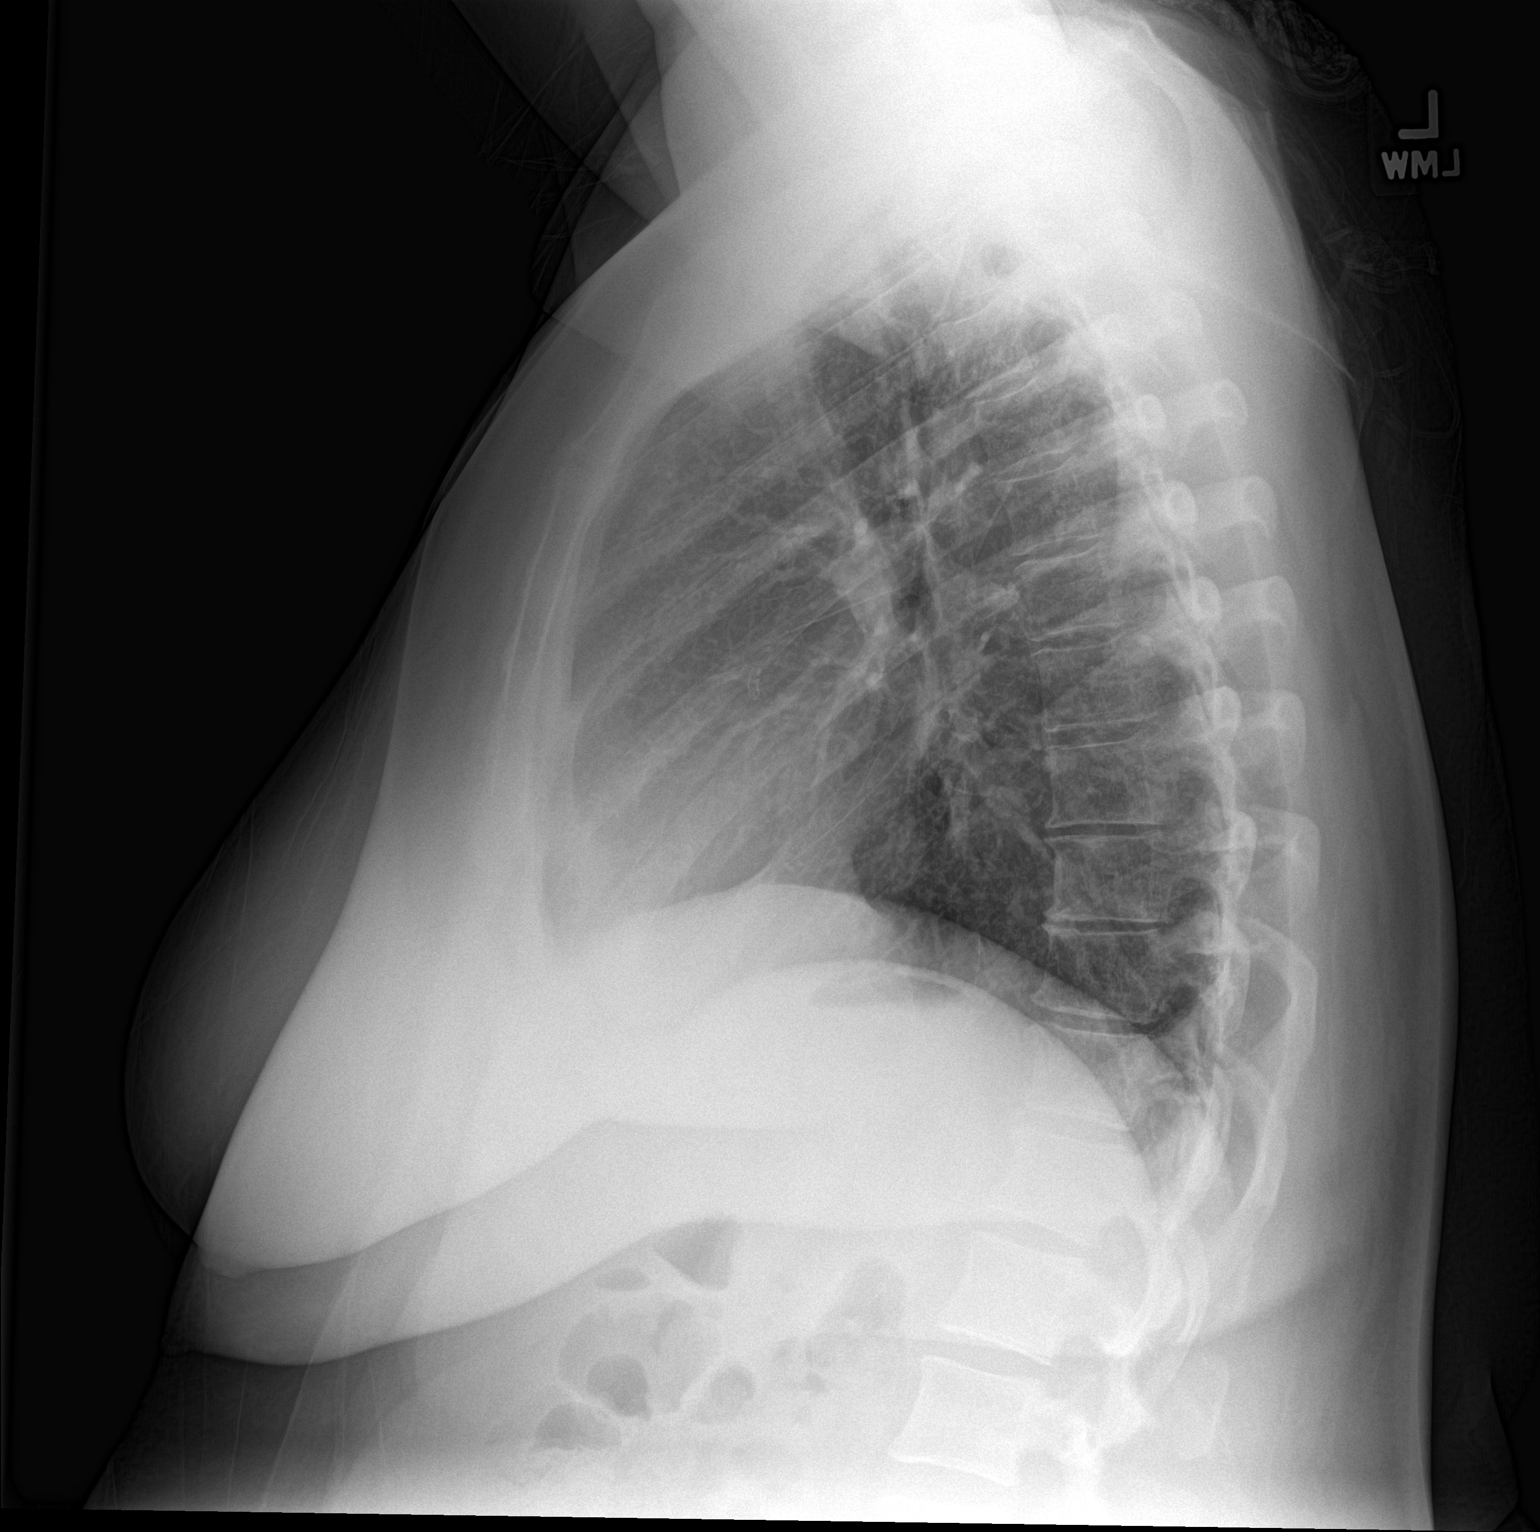

[2 of 2 positions shown; findings below may reference images not displayed]

FINDINGS: The heart size and mediastinal contours are within normal limits.
Both lungs are clear. The visualized skeletal structures are
unremarkable.
IMPRESSION: No active cardiopulmonary disease.

## 2018-01-07 MED ORDER — BENZONATATE 100 MG PO CAPS: 100.0000 mg | ORAL_CAPSULE | Freq: Two times a day (BID) | ORAL | 12 refills | Status: DC | PRN

## 2018-01-07 NOTE — Telephone Encounter (Signed)
Please review for refill. Thanks!  

## 2018-01-09 ENCOUNTER — Ambulatory Visit (INDEPENDENT_AMBULATORY_CARE_PROVIDER_SITE_OTHER): Payer: Medicaid Other | Admitting: Unknown Physician Specialty

## 2018-01-09 ENCOUNTER — Encounter: Payer: Self-pay | Admitting: Unknown Physician Specialty

## 2018-01-09 VITALS — BP 131/85 | HR 120 | Temp 98.2°F | Wt 232.0 lb

## 2018-01-09 DIAGNOSIS — R300 Dysuria: Secondary | ICD-10-CM

## 2018-01-09 DIAGNOSIS — I10 Essential (primary) hypertension: Secondary | ICD-10-CM

## 2018-01-09 DIAGNOSIS — E785 Hyperlipidemia, unspecified: Secondary | ICD-10-CM | POA: Diagnosis not present

## 2018-01-09 DIAGNOSIS — K529 Noninfective gastroenteritis and colitis, unspecified: Secondary | ICD-10-CM

## 2018-01-09 DIAGNOSIS — G8929 Other chronic pain: Secondary | ICD-10-CM | POA: Diagnosis not present

## 2018-01-09 DIAGNOSIS — F322 Major depressive disorder, single episode, severe without psychotic features: Secondary | ICD-10-CM

## 2018-01-09 DIAGNOSIS — R109 Unspecified abdominal pain: Secondary | ICD-10-CM | POA: Diagnosis not present

## 2018-01-09 DIAGNOSIS — E1165 Type 2 diabetes mellitus with hyperglycemia: Secondary | ICD-10-CM

## 2018-01-09 MED ORDER — SULFAMETHOXAZOLE-TRIMETHOPRIM 800-160 MG PO TABS: 1.0000 | ORAL_TABLET | Freq: Two times a day (BID) | ORAL | 0 refills | Status: DC

## 2018-01-09 MED ORDER — DICYCLOMINE HCL 20 MG PO TABS: 20.0000 mg | ORAL_TABLET | Freq: Four times a day (QID) | ORAL | 2 refills | Status: DC

## 2018-01-09 NOTE — Assessment & Plan Note (Addendum)
Hgb A1C down form 1.1 to 9.6%.  Difficult to control due to Gastroperesis.  Continue present meds and try to be more consistent with insulin

## 2018-01-09 NOTE — Assessment & Plan Note (Signed)
Rx for Bentyl.

## 2018-01-09 NOTE — Progress Notes (Signed)
BP 131/85 (BP Location: Left Arm, Cuff Size: Large)   Pulse (!) 120   Temp 98.2 F (36.8 C) (Oral)   Wt 232 lb (105.2 kg)   SpO2 99%   BMI 39.82 kg/m    Subjective:    Patient ID: [name] [name], female    DOB: [birth year], 37 y.o.   MRN: [id number]  HPI: [name] [name] is a 37 y.o. female  Chief Complaint  Patient presents with  . Depression  . Diabetes    pt states she has not had a recent eye exam  . Hyperlipidemia  . Hypertension   Pt is here with her sister  GI complaints Ongoing complaints.  Not eating always having diarrhea and vomiting.  She is frustrated and recently lost 13 pounds.  Extensive GI work-up done.  Diabetes: Using medications without difficulties.  Taking 60 units of Lantus but not consistent.  Cut back to at least 40 unless she hasn't eaten all day.  appt with Endocrine in March No hypoglycemic episodes No hyperglycemic episodes Blood Sugars averaging: "all over the place despite poor food intake"   eye exam within last year Last Hgb A1C: 11.1  Hypertension  Using medications without difficulty Average home BPs Not checking   Using medication without problems or lightheadedness No chest pain with exertion or shortness of breath No Edema  Elevated Cholesterol Using medications without problems No Muscle aches   Depression On Effexor which is helping panic.  Depression still there but pt and sister think it's due to health issues.    Depression screen [facility] 2/9 01/09/2018 08/18/2017 08/12/2017 05/16/2017 03/17/2017  Decreased Interest 0 1 2 0 0  Down, Depressed, Hopeless 3 1 2  0 0  PHQ - 2 Score 3 2 4  0 0  Altered sleeping 3 - 3 - -  Tired, decreased energy 3 - 2 - -  Change in appetite 3 - 2 - -  Feeling bad or failure about yourself  2 - 2 - -  Trouble concentrating 2 - 2 - -  Moving slowly or fidgety/restless 0 - 0 - -  Suicidal thoughts 2 0 0 - -  PHQ-9 Score 18 - 15 - -  Difficult doing work/chores - - - - -  Some recent data might be  hidden    Relevant past medical, surgical, family and social history reviewed and updated as indicated. Interim medical history since our last visit reviewed. Allergies and medications reviewed and updated.  Review of Systems  Genitourinary:       Dysuria.  Treated for bladder infection in the ER.  On Keflex    Per HPI unless specifically indicated above     Objective:    BP 131/85 (BP Location: Left Arm, Cuff Size: Large)   Pulse (!) 120   Temp 98.2 F (36.8 C) (Oral)   Wt 232 lb (105.2 kg)   SpO2 99%   BMI 39.82 kg/m   Wt Readings from Last 3 Encounters:  01/09/18 232 lb (105.2 kg)  12/22/17 240 lb (108.9 kg)  10/27/17 239 lb 3.2 oz (108.5 kg)    Physical Exam  Constitutional: She is oriented to person, place, and time. She appears well-developed and well-nourished. No distress.  HENT:  Head: Normocephalic and atraumatic.  Eyes: Conjunctivae and lids are normal. Right eye exhibits no discharge. Left eye exhibits no discharge. No scleral icterus.  Neck: Normal range of motion. Neck supple. No JVD present. Carotid bruit is not present.  Cardiovascular: Normal rate, regular  rhythm and normal heart sounds.  Pulmonary/Chest: Effort normal and breath sounds normal.  Abdominal: Normal appearance. There is no splenomegaly or hepatomegaly.  Musculoskeletal: Normal range of motion.  Neurological: She is alert and oriented to person, place, and time.  Skin: Skin is warm, dry and intact. No rash noted. No pallor.  Psychiatric: She has a normal mood and affect. Her behavior is normal. Judgment and thought content normal.    +     Assessment & Plan:   Problem List Items Addressed This Visit      Unprioritized   Chronic abdominal pain    Rx for Bentyl.        Depression, major, single episode, severe ([place]) - Primary   Relevant Orders   Comprehensive metabolic panel   CBC with Differential/Platelet   HTN (hypertension) (Chronic)   Relevant Orders   Comprehensive  metabolic panel   Hyperlipidemia   Poorly controlled type 2 diabetes mellitus (HCC)    Hgb A1C down form 1.1 to 9.6%.  Difficult to control due to Gastroperesis.  Continue present meds and try to be more consistent with insulin      Relevant Orders   Comprehensive metabolic panel   Bayer DCA Hb A1c Waived    Other Visit Diagnoses    Dysuria       Urine continue positive.  Failed Keflex.  Start Septra   Relevant Orders   UA/M w/rflx Culture, Routine   CBC with Differential/Platelet   Chronic diarrhea           Follow up plan: Return in about 3 months (around 04/08/2018).

## 2018-01-10 LAB — COMPREHENSIVE METABOLIC PANEL
ALT: 10 IU/L (ref 0–32)
AST: 16 IU/L (ref 0–40)
Albumin/Globulin Ratio: 0.9 — ABNORMAL LOW (ref 1.2–2.2)
Albumin: 3.3 g/dL — ABNORMAL LOW (ref 3.5–5.5)
Alkaline Phosphatase: 144 IU/L — ABNORMAL HIGH (ref 39–117)
BUN/Creatinine Ratio: 11 (ref 9–23)
BUN: 17 mg/dL (ref 6–20)
Bilirubin Total: 0.2 mg/dL (ref 0.0–1.2)
CO2: 20 mmol/L (ref 20–29)
Calcium: 8.9 mg/dL (ref 8.7–10.2)
Chloride: 101 mmol/L (ref 96–106)
Creatinine, Ser: 1.5 mg/dL — ABNORMAL HIGH (ref 0.57–1.00)
GFR calc Af Amer: 51 mL/min/{1.73_m2} — ABNORMAL LOW (ref >59)
GFR calc non Af Amer: 44 mL/min/{1.73_m2} — ABNORMAL LOW (ref >59)
Globulin, Total: 3.5 g/dL (ref 1.5–4.5)
Glucose: 357 mg/dL — ABNORMAL HIGH (ref 65–99)
Potassium: 4.9 mmol/L (ref 3.5–5.2)
Sodium: 137 mmol/L (ref 134–144)
Total Protein: 6.8 g/dL (ref 6.0–8.5)

## 2018-01-10 LAB — CBC WITH DIFFERENTIAL/PLATELET
Basophils Absolute: 0 10*3/uL (ref 0.0–0.2)
Basos: 1 %
EOS (ABSOLUTE): 0.3 10*3/uL (ref 0.0–0.4)
Eos: 4 %
Hematocrit: 36.1 % (ref 34.0–46.6)
Hemoglobin: 11.4 g/dL (ref 11.1–15.9)
Immature Grans (Abs): 0 10*3/uL (ref 0.0–0.1)
Immature Granulocytes: 0 %
Lymphocytes Absolute: 2.2 10*3/uL (ref 0.7–3.1)
Lymphs: 32 %
MCH: 25.6 pg — ABNORMAL LOW (ref 26.6–33.0)
MCHC: 31.6 g/dL (ref 31.5–35.7)
MCV: 81 fL (ref 79–97)
Monocytes Absolute: 0.3 10*3/uL (ref 0.1–0.9)
Monocytes: 4 %
Neutrophils Absolute: 4.2 10*3/uL (ref 1.4–7.0)
Neutrophils: 59 %
Platelets: 479 10*3/uL — ABNORMAL HIGH (ref 150–379)
RBC: 4.46 x10E6/uL (ref 3.77–5.28)
RDW: 14.7 % (ref 12.3–15.4)
WBC: 7 10*3/uL (ref 3.4–10.8)

## 2018-01-12 ENCOUNTER — Other Ambulatory Visit: Payer: Self-pay | Admitting: Unknown Physician Specialty

## 2018-01-12 LAB — UA/M W/RFLX CULTURE, ROUTINE
Bilirubin, UA: NEGATIVE
Nitrite, UA: NEGATIVE
Specific Gravity, UA: >1.03 — ABNORMAL HIGH (ref 1.005–1.030)
Urobilinogen, Ur: 0.2 mg/dL (ref 0.2–1.0)
pH, UA: 5.5 (ref 5.0–7.5)

## 2018-01-12 LAB — MICROSCOPIC EXAMINATION: WBC, UA: >30 /hpf — AB (ref 0–?)

## 2018-01-12 LAB — URINE CULTURE, REFLEX

## 2018-01-12 LAB — BAYER DCA HB A1C WAIVED: HB A1C (BAYER DCA - WAIVED): 9.6 % — ABNORMAL HIGH (ref <7.0)

## 2018-01-12 MED ORDER — AMOXICILLIN 875 MG PO TABS: 875.0000 mg | ORAL_TABLET | Freq: Two times a day (BID) | ORAL | 0 refills | Status: DC

## 2018-01-12 NOTE — Progress Notes (Signed)
Notified pt by mychart

## 2018-01-21 ENCOUNTER — Inpatient Hospital Stay (HOSPITAL_COMMUNITY)
Admission: EM | Admit: 2018-01-21 | Discharge: 2018-01-24 | DRG: 638 | Disposition: A | Discharge status: Home / Self Care | Payer: Medicaid Other | Attending: Internal Medicine | Admitting: Internal Medicine

## 2018-01-21 ENCOUNTER — Encounter (HOSPITAL_COMMUNITY): Payer: Self-pay

## 2018-01-21 ENCOUNTER — Other Ambulatory Visit: Payer: Self-pay

## 2018-01-21 DIAGNOSIS — Z91048 Other nonmedicinal substance allergy status: Secondary | ICD-10-CM

## 2018-01-21 DIAGNOSIS — D509 Iron deficiency anemia, unspecified: Secondary | ICD-10-CM | POA: Diagnosis present

## 2018-01-21 DIAGNOSIS — R109 Unspecified abdominal pain: Secondary | ICD-10-CM

## 2018-01-21 DIAGNOSIS — E1122 Type 2 diabetes mellitus with diabetic chronic kidney disease: Secondary | ICD-10-CM | POA: Diagnosis present

## 2018-01-21 DIAGNOSIS — E669 Obesity, unspecified: Secondary | ICD-10-CM | POA: Diagnosis present

## 2018-01-21 DIAGNOSIS — E1010 Type 1 diabetes mellitus with ketoacidosis without coma: Secondary | ICD-10-CM

## 2018-01-21 DIAGNOSIS — Z793 Long term (current) use of hormonal contraceptives: Secondary | ICD-10-CM

## 2018-01-21 DIAGNOSIS — I130 Hypertensive heart and chronic kidney disease with heart failure and stage 1 through stage 4 chronic kidney disease, or unspecified chronic kidney disease: Secondary | ICD-10-CM | POA: Diagnosis present

## 2018-01-21 DIAGNOSIS — Z801 Family history of malignant neoplasm of trachea, bronchus and lung: Secondary | ICD-10-CM

## 2018-01-21 DIAGNOSIS — Z792 Long term (current) use of antibiotics: Secondary | ICD-10-CM

## 2018-01-21 DIAGNOSIS — R079 Chest pain, unspecified: Secondary | ICD-10-CM

## 2018-01-21 DIAGNOSIS — E1165 Type 2 diabetes mellitus with hyperglycemia: Secondary | ICD-10-CM | POA: Diagnosis present

## 2018-01-21 DIAGNOSIS — Z91013 Allergy to seafood: Secondary | ICD-10-CM

## 2018-01-21 DIAGNOSIS — E1143 Type 2 diabetes mellitus with diabetic autonomic (poly)neuropathy: Secondary | ICD-10-CM | POA: Diagnosis present

## 2018-01-21 DIAGNOSIS — Z87440 Personal history of urinary (tract) infections: Secondary | ICD-10-CM

## 2018-01-21 DIAGNOSIS — I255 Ischemic cardiomyopathy: Secondary | ICD-10-CM | POA: Diagnosis present

## 2018-01-21 DIAGNOSIS — Z833 Family history of diabetes mellitus: Secondary | ICD-10-CM

## 2018-01-21 DIAGNOSIS — E876 Hypokalemia: Secondary | ICD-10-CM | POA: Diagnosis present

## 2018-01-21 DIAGNOSIS — K589 Irritable bowel syndrome without diarrhea: Secondary | ICD-10-CM | POA: Diagnosis present

## 2018-01-21 DIAGNOSIS — I5032 Chronic diastolic (congestive) heart failure: Secondary | ICD-10-CM | POA: Diagnosis present

## 2018-01-21 DIAGNOSIS — Z888 Allergy status to other drugs, medicaments and biological substances status: Secondary | ICD-10-CM

## 2018-01-21 DIAGNOSIS — E559 Vitamin D deficiency, unspecified: Secondary | ICD-10-CM | POA: Diagnosis present

## 2018-01-21 DIAGNOSIS — Z6839 Body mass index (BMI) 39.0-39.9, adult: Secondary | ICD-10-CM

## 2018-01-21 DIAGNOSIS — Z7982 Long term (current) use of aspirin: Secondary | ICD-10-CM

## 2018-01-21 DIAGNOSIS — I2510 Atherosclerotic heart disease of native coronary artery without angina pectoris: Secondary | ICD-10-CM | POA: Diagnosis present

## 2018-01-21 DIAGNOSIS — I252 Old myocardial infarction: Secondary | ICD-10-CM

## 2018-01-21 DIAGNOSIS — E785 Hyperlipidemia, unspecified: Secondary | ICD-10-CM | POA: Diagnosis present

## 2018-01-21 DIAGNOSIS — G8929 Other chronic pain: Secondary | ICD-10-CM | POA: Diagnosis present

## 2018-01-21 DIAGNOSIS — E1110 Type 2 diabetes mellitus with ketoacidosis without coma: Principal | ICD-10-CM | POA: Diagnosis present

## 2018-01-21 DIAGNOSIS — Z8042 Family history of malignant neoplasm of prostate: Secondary | ICD-10-CM

## 2018-01-21 DIAGNOSIS — N183 Chronic kidney disease, stage 3 (moderate): Secondary | ICD-10-CM | POA: Diagnosis present

## 2018-01-21 DIAGNOSIS — I25111 Atherosclerotic heart disease of native coronary artery with angina pectoris with documented spasm: Secondary | ICD-10-CM | POA: Diagnosis present

## 2018-01-21 DIAGNOSIS — N390 Urinary tract infection, site not specified: Secondary | ICD-10-CM

## 2018-01-21 DIAGNOSIS — Z955 Presence of coronary angioplasty implant and graft: Secondary | ICD-10-CM

## 2018-01-21 DIAGNOSIS — K3184 Gastroparesis: Secondary | ICD-10-CM | POA: Diagnosis present

## 2018-01-21 DIAGNOSIS — G4700 Insomnia, unspecified: Secondary | ICD-10-CM | POA: Diagnosis present

## 2018-01-21 DIAGNOSIS — Z87891 Personal history of nicotine dependence: Secondary | ICD-10-CM

## 2018-01-21 DIAGNOSIS — Z794 Long term (current) use of insulin: Secondary | ICD-10-CM

## 2018-01-21 LAB — CBC
HCT: 35.5 % — ABNORMAL LOW (ref 36.0–46.0)
Hemoglobin: 11.9 g/dL — ABNORMAL LOW (ref 12.0–15.0)
MCH: 26.2 pg (ref 26.0–34.0)
MCHC: 33.5 g/dL (ref 30.0–36.0)
MCV: 78 fL (ref 78.0–100.0)
Platelets: 392 10*3/uL (ref 150–400)
RBC: 4.55 MIL/uL (ref 3.87–5.11)
RDW: 13.3 % (ref 11.5–15.5)
WBC: 11.8 10*3/uL — ABNORMAL HIGH (ref 4.0–10.5)

## 2018-01-21 LAB — URINALYSIS, ROUTINE W REFLEX MICROSCOPIC
Bilirubin Urine: NEGATIVE
Glucose, UA: >=500 mg/dL — AB
Ketones, ur: 20 mg/dL — AB
Nitrite: NEGATIVE
Protein, ur: >=300 mg/dL — AB
Specific Gravity, Urine: 1.021 (ref 1.005–1.030)
pH: 7 (ref 5.0–8.0)

## 2018-01-21 LAB — COMPREHENSIVE METABOLIC PANEL
ALT: 12 U/L — ABNORMAL LOW (ref 14–54)
AST: 18 U/L (ref 15–41)
Albumin: 2.9 g/dL — ABNORMAL LOW (ref 3.5–5.0)
Alkaline Phosphatase: 126 U/L (ref 38–126)
Anion gap: 17 — ABNORMAL HIGH (ref 5–15)
BUN: 18 mg/dL (ref 6–20)
CO2: 19 mmol/L — ABNORMAL LOW (ref 22–32)
Calcium: 8.8 mg/dL — ABNORMAL LOW (ref 8.9–10.3)
Chloride: 100 mmol/L — ABNORMAL LOW (ref 101–111)
Creatinine, Ser: 1.13 mg/dL — ABNORMAL HIGH (ref 0.44–1.00)
GFR calc Af Amer: >60 mL/min (ref >60)
GFR calc non Af Amer: >60 mL/min (ref >60)
Glucose, Bld: 455 mg/dL — ABNORMAL HIGH (ref 65–99)
Potassium: 3.3 mmol/L — ABNORMAL LOW (ref 3.5–5.1)
Sodium: 136 mmol/L (ref 135–145)
Total Bilirubin: 0.6 mg/dL (ref 0.3–1.2)
Total Protein: 7.6 g/dL (ref 6.5–8.1)

## 2018-01-21 LAB — I-STAT BETA HCG BLOOD, ED (MC, WL, AP ONLY): I-stat hCG, quantitative: <5 m[IU]/mL (ref <5)

## 2018-01-21 LAB — LIPASE, BLOOD: Lipase: 21 U/L (ref 11–51)

## 2018-01-21 MED ORDER — METOCLOPRAMIDE HCL 5 MG/ML IJ SOLN
10.0000 mg | Freq: Once | INTRAMUSCULAR | Status: AC
  Administered 2018-01-21: 10 mg via INTRAVENOUS
  Filled 2018-01-21: qty 2

## 2018-01-21 MED ORDER — ONDANSETRON HCL 4 MG/2ML IJ SOLN
4.0000 mg | Freq: Once | INTRAMUSCULAR | Status: DC
  Filled 2018-01-21: qty 2

## 2018-01-21 MED ORDER — SODIUM CHLORIDE 0.9 % IV SOLN: INTRAVENOUS | Status: DC

## 2018-01-21 MED ORDER — POTASSIUM CHLORIDE 10 MEQ/100ML IV SOLN
10.0000 meq | INTRAVENOUS | Status: AC
  Administered 2018-01-22: 10 meq via INTRAVENOUS
  Filled 2018-01-21: qty 100

## 2018-01-21 MED ORDER — SODIUM CHLORIDE 0.9 % IV BOLUS (SEPSIS)
1000.0000 mL | Freq: Once | INTRAVENOUS | Status: AC
  Administered 2018-01-22: 1000 mL via INTRAVENOUS

## 2018-01-21 MED ORDER — HALOPERIDOL LACTATE 5 MG/ML IJ SOLN
4.0000 mg | Freq: Once | INTRAMUSCULAR | Status: AC
  Administered 2018-01-21: 4 mg via INTRAVENOUS
  Filled 2018-01-21: qty 1

## 2018-01-21 MED ORDER — SODIUM CHLORIDE 0.9 % IV BOLUS (SEPSIS)
1000.0000 mL | Freq: Once | INTRAVENOUS | Status: AC
  Administered 2018-01-21: 1000 mL via INTRAVENOUS

## 2018-01-21 MED ORDER — POTASSIUM CHLORIDE 10 MEQ/100ML IV SOLN
10.0000 meq | INTRAVENOUS | Status: AC
  Administered 2018-01-22 (×2): 10 meq via INTRAVENOUS
  Filled 2018-01-21: qty 100

## 2018-01-21 MED ORDER — SODIUM CHLORIDE 0.9 % IV SOLN
INTRAVENOUS | Status: DC
  Filled 2018-01-21: qty 1

## 2018-01-21 MED ORDER — SODIUM CHLORIDE 0.9 % IV SOLN
1.0000 g | Freq: Once | INTRAVENOUS | Status: DC
  Filled 2018-01-21: qty 10

## 2018-01-21 NOTE — ED Notes (Signed)
Patient states she feels too weak to walk to the restroom. Female urinal used.

## 2018-01-21 NOTE — ED Provider Notes (Signed)
[facility] DEPT Provider Note   CSN: [id number] Arrival date & time: 01/21/18  1944     History   Chief Complaint Chief Complaint  Patient presents with  . Abdominal Pain  . Emesis    HPI [name] [name] is a 37 y.o. female who presents with abdominal pain, N/V/D, dysuria. PMH significant for poorly controlled IDDM, gastroparesis, recurrent UTI, CHF EF 35%, hx of CAD and MI. Her sister is at bedside who helps provide history due to patient's distress. She states that she's had significant pain, N/V/D since December 2018. Today the pain acutely worsened. It is in the upper abdomen and severe. Feels similar to prior episodes of gastroparesis. She has been taking home meds without relief. She has been to the ED several times and followed up with her PCP and GI. There is talk of putting in an abdominal pacemaker but this was several months ago. She is currently being treated for a UTI with Keflex and Bactrim. Culture was positive for Strep. She reports continued UTI symptoms and has recurrent vomiting so isn't able to take the medicines. She's had an extensive GI work up including EGD and colonoscopy. Past surgical hx of appendectomy.  PCP: Dr. Julian Hy   HPI  Past Medical History:  Diagnosis Date  . Chronic systolic CHF (congestive heart failure) ([place])    a. echo 03/2015: EF 30-35%, mild concentric LVH, severe HK of inf, inflat, & lat walls, mod MR, mild TR;  b. 04/2015 LV Gram: EF 55-65%.  . Diabetes type 2, controlled ([place])    a. since 2002   . Diverticulosis, sigmoid 07/2016  . Heavy menses    a. H/O IUD - expired in 2014 - remains in place.  . Hyperlipidemia   . Hypertension   . Iron deficiency anemia   . Ischemic cardiomyopathy    a. 03/2015 EF 30-35% post NSTEMI;  b. 04/2015 EF 55-65% on LV gram.  . Kidney disease   . MI (myocardial infarction) ([place]) 03/29/2015  . Obesity   . Renal insufficiency   . Tobacco abuse    a. quit 03/2015.  Marland Kitchen Uterine  fibroid   . Vertigo   . Vitamin D deficiency     Patient Active Problem List   Diagnosis Date Noted  . Gastroparesis 10/27/2017  . Poorly controlled type 2 diabetes mellitus ([place]) 08/27/2017  . Lung nodule < 6cm on CT 08/12/2017  . Depression, major, single episode, severe ([place]) 08/12/2017  . Chronic abdominal pain 08/03/2017  . Obstructive sleep apnea 05/16/2017  . Neuropathy 01/17/2017  . Edema of both ankles 01/17/2017  . Fatigue 12/16/2016  . Myalgic 10/25/2016  . Vitamin D deficiency 10/25/2016  . Insomnia 10/25/2016  . Urinary tract infection 09/20/2016  . HTN (hypertension) 09/05/2016  . Vertigo 06/21/2016  . Elevated liver function tests 06/05/2016  . Chronic diastolic heart failure ([place]) 03/19/2016  . Tachycardia 03/19/2016  . IBS (irritable bowel syndrome) 12/15/2015  . Proteinuria 08/30/2015  . Coronary artery disease involving native coronary artery of native heart with angina pectoris with documented spasm ([place])   . Angina pectoris ([place])   . Iron deficiency anemia   . Heavy menses   . SOB (shortness of breath) 08/21/2015  . Hyperlipidemia 04/13/2015  . Ischemic cardiomyopathy   . Obesity     Past Surgical History:  Procedure Laterality Date  . APPENDECTOMY    . CARDIAC CATHETERIZATION  4/16   x2 stent [facility]  . CARDIAC CATHETERIZATION N/A 05/05/2015  Procedure: Left Heart Cath and Coronary Angiography;  Surgeon: Peter M Martinique, MD;  Location: [facility] CV LAB;  Service: Cardiovascular;  Laterality: N/A;  . COLONOSCOPY WITH PROPOFOL N/A 07/31/2016   Procedure: COLONOSCOPY WITH PROPOFOL;  Surgeon: Lollie Sails, MD;  Location[facility] ENDOSCOPY;  Service: Endoscopy;  Laterality: N/A;  . ESOPHAGOGASTRODUODENOSCOPY (EGD) WITH PROPOFOL N/A 07/31/2016   Procedure: ESOPHAGOGASTRODUODENOSCOPY (EGD) WITH PROPOFOL;  Surgeon: Lollie Sails, MD;  Location: [facility] ENDOSCOPY;  Service: Endoscopy;  Laterality: N/A;  . LAPAROSCOPIC APPENDECTOMY N/A 08/07/2016    Procedure: APPENDECTOMY LAPAROSCOPIC;  Surgeon: Hubbard Robinson, MD;  Location: [facility] ORS;  Service: General;  Laterality: N/A;  . MOUTH SURGERY      OB History    Gravida Para Term Preterm AB Living   3       2 1    SAB TAB Ectopic Multiple Live Births   2               Home Medications    Prior to Admission medications   Medication Sig Start Date End Date Taking? Authorizing Provider  albuterol (PROVENTIL HFA;VENTOLIN HFA) 108 (90 Base) MCG/ACT inhaler Inhale 2 puffs into the lungs every 6 (six) hours as needed for wheezing or shortness of breath. 02/19/17   Volney American, PA-C  amitriptyline (ELAVIL) 10 MG tablet Take 1-2 tablets (10-20 mg total) by mouth at bedtime. Take 2 tablets at bedtime. Total of 20mg  nightly Patient taking differently: Take 30 mg at bedtime by mouth.  05/15/17   Armbruster, Carlota Raspberry, MD  amoxicillin (AMOXIL) 875 MG tablet Take 1 tablet (875 mg total) by mouth 2 (two) times daily. 01/12/18   Kathrine Haddock, NP  aspirin EC 81 MG tablet Take 81 mg by mouth daily.    [provider]  benzonatate (TESSALON) 100 MG capsule Take 1 capsule (100 mg total) by mouth 2 (two) times daily as needed for cough. 01/07/18   Kathrine Haddock, NP  Cholecalciferol 2000 units TABS Take 1 tablet by mouth daily.    [provider]  dicyclomine (BENTYL) 20 MG tablet Take 1 tablet (20 mg total) by mouth every 6 (six) hours. 01/09/18   Kathrine Haddock, NP  Dulaglutide (TRULICITY) 1.5 AC/1.6SA SOPN Inject 1.5 mg into the skin once a week. 09/29/17   Kathrine Haddock, NP  etonogestrel (NEXPLANON) 68 MG IMPL implant 1 each by Subdermal route once.    [provider]  ferrous sulfate 325 (65 FE) MG tablet TAKE ONE TABLET BY MOUTH THREE TIMES DAILY WITH MEALS Patient taking differently: take one tablet by mouth once daily 03/03/17   Kathrine Haddock, NP  glimepiride (AMARYL) 4 MG tablet Take 1 tablet (4 mg total) by mouth daily. 12/17/17   Kathrine Haddock, NP  glucose blood  (ACCU-CHEK ACTIVE STRIPS) test strip Use as instructed 03/25/16   Kathrine Haddock, NP  Insulin Glargine (LANTUS SOLOSTAR) 100 UNIT/ML Solostar Pen Inject 60 Units into the skin daily at 10 pm. 10/02/17   Kathrine Haddock, NP  Insulin Pen Needle 32G X 4 MM MISC 1 Units by Does not apply route every morning. Pen needles 08/27/17   Kathrine Haddock, NP  isosorbide mononitrate (IMDUR) 30 MG 24 hr tablet TAKE 1 TABLET BY MOUTH ONCE DAILY 10/06/17   Wellington Hampshire, MD  loperamide (IMODIUM) 2 MG capsule Take 2 mg by mouth as needed for diarrhea or loose stools.    [provider]  meclizine (ANTIVERT) 32 MG tablet Take 1 tablet (32 mg  total) by mouth 3 (three) times daily as needed. Patient taking differently: Take 32 mg by mouth 3 (three) times daily as needed for dizziness or nausea.  06/21/16   Kathrine Haddock, NP  metoCLOPramide (REGLAN) 10 MG tablet Take 1 tablet (10 mg total) by mouth 3 (three) times daily before meals. 06/02/17   Armbruster, Carlota Raspberry, MD  metoprolol tartrate (LOPRESSOR) 50 MG tablet Take 1 tablet (50 mg total) by mouth 2 (two) times daily. Need office visit 01/07/18   Alisa Graff, FNP  nitroGLYCERIN (NITROSTAT) 0.4 MG SL tablet Place 1 tablet (0.4 mg total) under the tongue every 5 (five) minutes as needed for chest pain. Reported on 02/20/2016 03/17/17   Alisa Graff, FNP  ondansetron (ZOFRAN ODT) 8 MG disintegrating tablet Take 1 tablet (8 mg total) by mouth every 8 (eight) hours as needed for nausea or vomiting. Patient not taking: Reported on 01/09/2018 05/15/17   Yetta Flock, MD  pantoprazole (PROTONIX) 40 MG tablet TAKE ONE TABLET BY MOUTH ONCE DAILY AT 6 AM. 12/17/17   Kathrine Haddock, NP  promethazine (PHENERGAN) 25 MG suppository Place 1 suppository (25 mg total) rectally every 6 (six) hours as needed for nausea or vomiting. 12/22/17   Loney Hering, MD  promethazine (PHENERGAN) 25 MG tablet Take 1 tablet (25 mg total) every 8 (eight) hours as needed by mouth for  nausea or vomiting. 10/27/17   Kathrine Haddock, NP  rifaximin (XIFAXAN) 550 MG TABS tablet Take 1 tablet (550 mg total) by mouth 3 (three) times daily. Patient not taking: Reported on 09/29/2017 06/20/17   Yetta Flock, MD  rosuvastatin (CRESTOR) 5 MG tablet TAKE ONE TABLET BY MOUTH ONCE DAILY 06/02/17   Wellington Hampshire, MD  sulfamethoxazole-trimethoprim (BACTRIM DS,SEPTRA DS) 800-160 MG tablet Take 1 tablet by mouth 2 (two) times daily. 01/09/18   Kathrine Haddock, NP  venlafaxine XR (EFFEXOR-XR) 150 MG 24 hr capsule Take 150 mg by mouth daily with breakfast.     [provider]    Family History Family History  Problem Relation Age of Onset  . Diabetes Father   . Cancer Maternal Grandmother        lung  . Cancer Maternal Grandfather        prostate  . Diabetes Paternal Grandfather   . Diabetes Paternal Grandmother   . Cancer - Cervical Maternal Aunt     Social History Social History   Tobacco Use  . Smoking status: Former Smoker    Years: 15.00    Last attempt to quit: 03/10/2015    Years since quitting: 2.8  . Smokeless tobacco: Never Used  Substance Use Topics  . Alcohol use: No    Alcohol/week: 0.0 oz  . Drug use: No     Allergies   Lisinopril; Rosemary oil; Shellfish allergy; and Metformin and related   Review of Systems Review of Systems  Constitutional: Negative for chills and fever.  Respiratory: Negative for shortness of breath.   Cardiovascular: Negative for chest pain.  Gastrointestinal: Positive for abdominal pain, diarrhea, nausea and vomiting. Negative for anal bleeding.  Genitourinary: Positive for dysuria.  All other systems reviewed and are negative.    Physical Exam Updated Vital Signs BP (!) 180/122 (BP Location: Left Arm)   Pulse 100   Temp (!) 97.3 F (36.3 C) (Oral)   Resp 16   SpO2 100%   Physical Exam  Constitutional: She is oriented to person, place, and time. She appears well-developed and well-nourished.  She appears  distressed.  Obese female moaning in pain. Unable to give history  HENT:  Head: Normocephalic and atraumatic.  Eyes: Conjunctivae are normal. Pupils are equal, round, and reactive to light. Right eye exhibits no discharge. Left eye exhibits no discharge. No scleral icterus.  Neck: Normal range of motion.  Cardiovascular: Normal rate and regular rhythm.  Pulmonary/Chest: Effort normal and breath sounds normal. No respiratory distress.  Abdominal: Soft. Bowel sounds are normal. She exhibits no distension. There is tenderness (epigastric).  Neurological: She is alert and oriented to person, place, and time.  Skin: Skin is warm and dry.  Psychiatric: She has a normal mood and affect. Her behavior is normal.  Nursing note and vitals reviewed.    ED Treatments / Results  Labs (all labs ordered are listed, but only abnormal results are displayed) Labs Reviewed  COMPREHENSIVE METABOLIC PANEL - Abnormal; Notable for the following components:      Result Value   Potassium 3.3 (*)    Chloride 100 (*)    CO2 19 (*)    Glucose, Bld 455 (*)    Creatinine, Ser 1.13 (*)    Calcium 8.8 (*)    Albumin 2.9 (*)    ALT 12 (*)    Anion gap 17 (*)    All other components within normal limits  CBC - Abnormal; Notable for the following components:   WBC 11.8 (*)    Hemoglobin 11.9 (*)    HCT 35.5 (*)    All other components within normal limits  URINALYSIS, ROUTINE W REFLEX MICROSCOPIC - Abnormal; Notable for the following components:   APPearance HAZY (*)    Glucose, UA >=500 (*)    Hgb urine dipstick MODERATE (*)    Ketones, ur 20 (*)    Protein, ur >=300 (*)    Leukocytes, UA MODERATE (*)    Bacteria, UA RARE (*)    Squamous Epithelial / LPF 0-5 (*)    All other components within normal limits  URINE CULTURE  LIPASE, BLOOD  BLOOD GAS, VENOUS  I-STAT BETA HCG BLOOD, ED ([facility], [facility], [facility] ONLY)    EKG  EKG Interpretation None       Radiology No results found.  Procedures Procedures  (including critical care time)  Medications Ordered in ED Medications  insulin regular (NOVOLIN R,HUMULIN R) 100 Units in sodium chloride 0.9 % 100 mL (1 Units/mL) infusion (not administered)  potassium chloride 10 mEq in 100 mL IVPB (not administered)  sodium chloride 0.9 % bolus 1,000 mL (not administered)    And  0.9 %  sodium chloride infusion (not administered)  potassium chloride 10 mEq in 100 mL IVPB (not administered)  sodium chloride 0.9 % bolus 1,000 mL (1,000 mLs Intravenous New Bag/Given 01/21/18 2148)  metoCLOPramide (REGLAN) injection 10 mg (10 mg Intravenous Given 01/21/18 2148)  haloperidol lactate (HALDOL) injection 4 mg (4 mg Intravenous Given 01/21/18 2148)     Initial Impression / Assessment and Plan / ED Course  I have reviewed the triage vital signs and the nursing notes.  Pertinent labs & imaging results that were available during my care of the patient were reviewed by me and considered in my medical decision making (see chart for details).  37 year old female presents with epigastric abdominal pain, N/V, and recurrent UTI. She has evidence of DKA. She is hypertensive and mildly tachycardic. Otherwise vitals are normal. On initial exam she is very uncomfortable, rolling in the bed and dry heaving every several  minutes. Orders for Haldol and Reglan were placed. Labs and UA are pending.  On re-exam, she is much more comfortable. Abdomen is tender in the epigastric area. CBC is remarkable for mild leukocytosis and mild anemia. CMP is remarkable for mild hypokalemia (3.3), low Cl (100), low bicarb (19), hyperglycemia (455), elevated SCr (1.13), low Ca (8.8), elevated anion gap (17). UA shows ongoing infection and ketones. Rocephin and glucosestablizer were ordered. Discussed with Dr. Roderic Palau. Due to her inability to tolerate oral antibiotics at home, will admit for management of DKA and treatment of UTI. Spoke with Dr. Shanon Brow who will admit.  Final Clinical Impressions(s) / ED  Diagnoses   Final diagnoses:  Diabetic ketoacidosis without coma associated with type 2 diabetes mellitus ([place])  Hypokalemia  Urinary tract infection without hematuria, site unspecified    ED Discharge Orders    None       Recardo Evangelist, PA-C 01/22/18 0016    Milton Ferguson, MD 01/22/18 [phone]

## 2018-01-21 NOTE — ED Triage Notes (Signed)
Pt c/o N/V/D x 10 days. She states that it worsened today with dark brown/ red emesis starting last night. Her abdominal pain is epigastric mid abdominal pain. Hx MI, stent placement, DM2, stage 3 kidney disease. 550 mL and 4mg  of zofran given en route.

## 2018-01-22 ENCOUNTER — Observation Stay (HOSPITAL_COMMUNITY): Payer: Medicaid Other

## 2018-01-22 DIAGNOSIS — I25111 Atherosclerotic heart disease of native coronary artery with angina pectoris with documented spasm: Secondary | ICD-10-CM | POA: Diagnosis present

## 2018-01-22 DIAGNOSIS — G4700 Insomnia, unspecified: Secondary | ICD-10-CM | POA: Diagnosis present

## 2018-01-22 DIAGNOSIS — Z87440 Personal history of urinary (tract) infections: Secondary | ICD-10-CM | POA: Diagnosis not present

## 2018-01-22 DIAGNOSIS — E1110 Type 2 diabetes mellitus with ketoacidosis without coma: Secondary | ICD-10-CM | POA: Diagnosis present

## 2018-01-22 DIAGNOSIS — Z91048 Other nonmedicinal substance allergy status: Secondary | ICD-10-CM | POA: Diagnosis not present

## 2018-01-22 DIAGNOSIS — I130 Hypertensive heart and chronic kidney disease with heart failure and stage 1 through stage 4 chronic kidney disease, or unspecified chronic kidney disease: Secondary | ICD-10-CM | POA: Diagnosis present

## 2018-01-22 DIAGNOSIS — N390 Urinary tract infection, site not specified: Secondary | ICD-10-CM | POA: Diagnosis present

## 2018-01-22 DIAGNOSIS — E1010 Type 1 diabetes mellitus with ketoacidosis without coma: Secondary | ICD-10-CM

## 2018-01-22 DIAGNOSIS — R109 Unspecified abdominal pain: Secondary | ICD-10-CM

## 2018-01-22 DIAGNOSIS — I255 Ischemic cardiomyopathy: Secondary | ICD-10-CM | POA: Diagnosis present

## 2018-01-22 DIAGNOSIS — E785 Hyperlipidemia, unspecified: Secondary | ICD-10-CM | POA: Diagnosis present

## 2018-01-22 DIAGNOSIS — E559 Vitamin D deficiency, unspecified: Secondary | ICD-10-CM | POA: Diagnosis present

## 2018-01-22 DIAGNOSIS — K3184 Gastroparesis: Secondary | ICD-10-CM

## 2018-01-22 DIAGNOSIS — Z91013 Allergy to seafood: Secondary | ICD-10-CM | POA: Diagnosis not present

## 2018-01-22 DIAGNOSIS — E669 Obesity, unspecified: Secondary | ICD-10-CM | POA: Diagnosis present

## 2018-01-22 DIAGNOSIS — Z6839 Body mass index (BMI) 39.0-39.9, adult: Secondary | ICD-10-CM | POA: Diagnosis not present

## 2018-01-22 DIAGNOSIS — I252 Old myocardial infarction: Secondary | ICD-10-CM | POA: Diagnosis not present

## 2018-01-22 DIAGNOSIS — Z888 Allergy status to other drugs, medicaments and biological substances status: Secondary | ICD-10-CM | POA: Diagnosis not present

## 2018-01-22 DIAGNOSIS — E1143 Type 2 diabetes mellitus with diabetic autonomic (poly)neuropathy: Secondary | ICD-10-CM | POA: Diagnosis present

## 2018-01-22 DIAGNOSIS — N183 Chronic kidney disease, stage 3 (moderate): Secondary | ICD-10-CM | POA: Diagnosis present

## 2018-01-22 DIAGNOSIS — G8929 Other chronic pain: Secondary | ICD-10-CM | POA: Diagnosis present

## 2018-01-22 DIAGNOSIS — I5032 Chronic diastolic (congestive) heart failure: Secondary | ICD-10-CM | POA: Diagnosis present

## 2018-01-22 DIAGNOSIS — K589 Irritable bowel syndrome without diarrhea: Secondary | ICD-10-CM | POA: Diagnosis present

## 2018-01-22 DIAGNOSIS — D509 Iron deficiency anemia, unspecified: Secondary | ICD-10-CM | POA: Diagnosis present

## 2018-01-22 DIAGNOSIS — E876 Hypokalemia: Secondary | ICD-10-CM | POA: Diagnosis present

## 2018-01-22 DIAGNOSIS — E1122 Type 2 diabetes mellitus with diabetic chronic kidney disease: Secondary | ICD-10-CM | POA: Diagnosis present

## 2018-01-22 LAB — BASIC METABOLIC PANEL
Anion gap: 14 (ref 5–15)
Anion gap: 8 (ref 5–15)
BUN: 17 mg/dL (ref 6–20)
BUN: 15 mg/dL (ref 6–20)
CO2: 20 mmol/L — ABNORMAL LOW (ref 22–32)
CO2: 22 mmol/L (ref 22–32)
Calcium: 8.5 mg/dL — ABNORMAL LOW (ref 8.9–10.3)
Calcium: 8.2 mg/dL — ABNORMAL LOW (ref 8.9–10.3)
Chloride: 102 mmol/L (ref 101–111)
Chloride: 109 mmol/L (ref 101–111)
Creatinine, Ser: 1.11 mg/dL — ABNORMAL HIGH (ref 0.44–1.00)
Creatinine, Ser: 1.04 mg/dL — ABNORMAL HIGH (ref 0.44–1.00)
GFR calc Af Amer: >60 mL/min (ref >60)
GFR calc Af Amer: >60 mL/min (ref >60)
GFR calc non Af Amer: >60 mL/min (ref >60)
GFR calc non Af Amer: >60 mL/min (ref >60)
Glucose, Bld: 453 mg/dL — ABNORMAL HIGH (ref 65–99)
Glucose, Bld: 126 mg/dL — ABNORMAL HIGH (ref 65–99)
Potassium: 3.9 mmol/L (ref 3.5–5.1)
Potassium: 3.9 mmol/L (ref 3.5–5.1)
Sodium: 136 mmol/L (ref 135–145)
Sodium: 139 mmol/L (ref 135–145)

## 2018-01-22 LAB — HEMOGLOBIN A1C
Hgb A1c MFr Bld: 11.5 % — ABNORMAL HIGH (ref 4.8–5.6)
Mean Plasma Glucose: 283.35 mg/dL

## 2018-01-22 LAB — GLUCOSE, CAPILLARY
Glucose-Capillary: 150 mg/dL — ABNORMAL HIGH (ref 65–99)
Glucose-Capillary: 103 mg/dL — ABNORMAL HIGH (ref 65–99)
Glucose-Capillary: 154 mg/dL — ABNORMAL HIGH (ref 65–99)
Glucose-Capillary: 157 mg/dL — ABNORMAL HIGH (ref 65–99)
Glucose-Capillary: 195 mg/dL — ABNORMAL HIGH (ref 65–99)
Glucose-Capillary: 160 mg/dL — ABNORMAL HIGH (ref 65–99)
Glucose-Capillary: 154 mg/dL — ABNORMAL HIGH (ref 65–99)
Glucose-Capillary: 148 mg/dL — ABNORMAL HIGH (ref 65–99)
Glucose-Capillary: 120 mg/dL — ABNORMAL HIGH (ref 65–99)

## 2018-01-22 LAB — CBG MONITORING, ED
Glucose-Capillary: 430 mg/dL — ABNORMAL HIGH (ref 65–99)
Glucose-Capillary: 385 mg/dL — ABNORMAL HIGH (ref 65–99)
Glucose-Capillary: 333 mg/dL — ABNORMAL HIGH (ref 65–99)
Glucose-Capillary: 316 mg/dL — ABNORMAL HIGH (ref 65–99)
Glucose-Capillary: 266 mg/dL — ABNORMAL HIGH (ref 65–99)
Glucose-Capillary: 213 mg/dL — ABNORMAL HIGH (ref 65–99)

## 2018-01-22 LAB — MRSA PCR SCREENING: MRSA by PCR: POSITIVE — AB

## 2018-01-22 LAB — MAGNESIUM: Magnesium: 1.2 mg/dL — ABNORMAL LOW (ref 1.7–2.4)

## 2018-01-22 MED ORDER — ASPIRIN EC 81 MG PO TBEC
81.0000 mg | DELAYED_RELEASE_TABLET | Freq: Every day | ORAL | Status: DC
  Administered 2018-01-22 – 2018-01-24 (×3): 81 mg via ORAL
  Filled 2018-01-22 (×4): qty 1

## 2018-01-22 MED ORDER — NITROGLYCERIN 0.4 MG SL SUBL
0.4000 mg | SUBLINGUAL_TABLET | SUBLINGUAL | Status: DC | PRN
  Administered 2018-01-22: 0.4 mg via SUBLINGUAL
  Filled 2018-01-22: qty 1

## 2018-01-22 MED ORDER — CHLORHEXIDINE GLUCONATE CLOTH 2 % EX PADS
6.0000 | MEDICATED_PAD | Freq: Every day | CUTANEOUS | Status: DC
  Administered 2018-01-23 – 2018-01-24 (×2): 6 via TOPICAL

## 2018-01-22 MED ORDER — ISOSORBIDE MONONITRATE ER 60 MG PO TB24
30.0000 mg | ORAL_TABLET | Freq: Every day | ORAL | Status: DC
  Administered 2018-01-22: 30 mg via ORAL
  Filled 2018-01-22 (×2): qty 1

## 2018-01-22 MED ORDER — METOPROLOL TARTRATE 25 MG PO TABS
50.0000 mg | ORAL_TABLET | Freq: Two times a day (BID) | ORAL | Status: DC
  Administered 2018-01-22 (×3): 50 mg via ORAL
  Filled 2018-01-22 (×3): qty 2

## 2018-01-22 MED ORDER — PANTOPRAZOLE SODIUM 40 MG IV SOLR
40.0000 mg | Freq: Two times a day (BID) | INTRAVENOUS | Status: DC
  Administered 2018-01-22 – 2018-01-24 (×6): 40 mg via INTRAVENOUS
  Filled 2018-01-22 (×5): qty 40

## 2018-01-22 MED ORDER — INSULIN ASPART 100 UNIT/ML ~~LOC~~ SOLN: 0.0000 [IU] | Freq: Three times a day (TID) | SUBCUTANEOUS | Status: DC

## 2018-01-22 MED ORDER — HYDROMORPHONE HCL 1 MG/ML IJ SOLN
0.5000 mg | INTRAMUSCULAR | Status: DC | PRN
  Administered 2018-01-22 – 2018-01-23 (×5): 0.5 mg via INTRAVENOUS
  Filled 2018-01-22 (×2): qty 1
  Filled 2018-01-22: qty 0.5
  Filled 2018-01-22 (×2): qty 1

## 2018-01-22 MED ORDER — ALBUTEROL SULFATE HFA 108 (90 BASE) MCG/ACT IN AERS: 2.0000 | INHALATION_SPRAY | Freq: Four times a day (QID) | RESPIRATORY_TRACT | Status: DC | PRN

## 2018-01-22 MED ORDER — DULAGLUTIDE 1.5 MG/0.5ML ~~LOC~~ SOAJ: 1.5000 mg | SUBCUTANEOUS | Status: DC

## 2018-01-22 MED ORDER — POTASSIUM CHLORIDE 10 MEQ/100ML IV SOLN
10.0000 meq | INTRAVENOUS | Status: AC
  Administered 2018-01-22: 10 meq via INTRAVENOUS
  Filled 2018-01-22 (×2): qty 100

## 2018-01-22 MED ORDER — ETONOGESTREL 68 MG ~~LOC~~ IMPL: 1.0000 | DRUG_IMPLANT | Freq: Once | SUBCUTANEOUS | Status: DC

## 2018-01-22 MED ORDER — AMITRIPTYLINE HCL 10 MG PO TABS
20.0000 mg | ORAL_TABLET | Freq: Every day | ORAL | Status: DC
  Administered 2018-01-22 – 2018-01-23 (×3): 20 mg via ORAL
  Filled 2018-01-22 (×3): qty 2

## 2018-01-22 MED ORDER — SODIUM CHLORIDE 0.9 % IV SOLN
INTRAVENOUS | Status: DC
  Administered 2018-01-22: 03:00:00 via INTRAVENOUS

## 2018-01-22 MED ORDER — ONDANSETRON HCL 4 MG/2ML IJ SOLN
4.0000 mg | Freq: Four times a day (QID) | INTRAMUSCULAR | Status: DC | PRN
  Administered 2018-01-22: 4 mg via INTRAVENOUS
  Filled 2018-01-22: qty 2

## 2018-01-22 MED ORDER — DEXTROSE-NACL 5-0.45 % IV SOLN
INTRAVENOUS | Status: DC
  Administered 2018-01-22: 09:00:00 via INTRAVENOUS

## 2018-01-22 MED ORDER — BENZONATATE 100 MG PO CAPS: 100.0000 mg | ORAL_CAPSULE | Freq: Two times a day (BID) | ORAL | Status: DC | PRN

## 2018-01-22 MED ORDER — MUPIROCIN 2 % EX OINT
1.0000 "application " | TOPICAL_OINTMENT | Freq: Two times a day (BID) | CUTANEOUS | Status: DC
  Administered 2018-01-22 – 2018-01-24 (×5): 1 via NASAL
  Filled 2018-01-22 (×2): qty 22

## 2018-01-22 MED ORDER — DICYCLOMINE HCL 20 MG PO TABS
20.0000 mg | ORAL_TABLET | Freq: Four times a day (QID) | ORAL | Status: DC
  Administered 2018-01-22 – 2018-01-24 (×10): 20 mg via ORAL
  Filled 2018-01-22 (×13): qty 1

## 2018-01-22 MED ORDER — ALBUTEROL SULFATE (2.5 MG/3ML) 0.083% IN NEBU: 2.5000 mg | INHALATION_SOLUTION | Freq: Four times a day (QID) | RESPIRATORY_TRACT | Status: DC | PRN

## 2018-01-22 MED ORDER — MORPHINE SULFATE (PF) 2 MG/ML IV SOLN
2.0000 mg | Freq: Once | INTRAVENOUS | Status: AC
  Administered 2018-01-22: 2 mg via INTRAVENOUS
  Filled 2018-01-22: qty 1

## 2018-01-22 MED ORDER — INSULIN ASPART 100 UNIT/ML ~~LOC~~ SOLN
0.0000 [IU] | SUBCUTANEOUS | Status: DC
  Administered 2018-01-22: 2 [IU] via SUBCUTANEOUS
  Administered 2018-01-22: 3 [IU] via SUBCUTANEOUS
  Administered 2018-01-23: 2 [IU] via SUBCUTANEOUS
  Administered 2018-01-23: 3 [IU] via SUBCUTANEOUS
  Administered 2018-01-23 – 2018-01-24 (×2): 2 [IU] via SUBCUTANEOUS

## 2018-01-22 MED ORDER — VITAMIN D 1000 UNITS PO TABS
2000.0000 [IU] | ORAL_TABLET | Freq: Every day | ORAL | Status: DC
  Administered 2018-01-22 – 2018-01-24 (×3): 2000 [IU] via ORAL
  Filled 2018-01-22 (×4): qty 2

## 2018-01-22 MED ORDER — LOPERAMIDE HCL 2 MG PO CAPS: 2.0000 mg | ORAL_CAPSULE | ORAL | Status: DC | PRN

## 2018-01-22 MED ORDER — MAGNESIUM SULFATE 2 GM/50ML IV SOLN
2.0000 g | Freq: Once | INTRAVENOUS | Status: AC
  Administered 2018-01-22: 2 g via INTRAVENOUS
  Filled 2018-01-22: qty 50

## 2018-01-22 MED ORDER — SODIUM CHLORIDE 0.9 % IV SOLN
INTRAVENOUS | Status: DC
  Administered 2018-01-22 – 2018-01-24 (×7): via INTRAVENOUS

## 2018-01-22 MED ORDER — SODIUM CHLORIDE 0.9 % IV SOLN
INTRAVENOUS | Status: DC
  Administered 2018-01-22: 3.7 [IU]/h via INTRAVENOUS
  Filled 2018-01-22: qty 1

## 2018-01-22 MED ORDER — INSULIN GLARGINE 100 UNIT/ML ~~LOC~~ SOLN
50.0000 [IU] | Freq: Every day | SUBCUTANEOUS | Status: DC
  Administered 2018-01-22 – 2018-01-24 (×3): 50 [IU] via SUBCUTANEOUS
  Filled 2018-01-22 (×3): qty 0.5

## 2018-01-22 MED ORDER — INSULIN ASPART 100 UNIT/ML ~~LOC~~ SOLN: 0.0000 [IU] | Freq: Every day | SUBCUTANEOUS | Status: DC

## 2018-01-22 MED ORDER — SODIUM CHLORIDE 0.9 % IV SOLN
1.0000 g | Freq: Every day | INTRAVENOUS | Status: DC
  Administered 2018-01-22 – 2018-01-23 (×3): 1 g via INTRAVENOUS
  Filled 2018-01-22: qty 10
  Filled 2018-01-22 (×2): qty 1
  Filled 2018-01-22: qty 10

## 2018-01-22 MED ORDER — PROMETHAZINE HCL 25 MG/ML IJ SOLN
25.0000 mg | Freq: Four times a day (QID) | INTRAMUSCULAR | Status: DC | PRN
  Administered 2018-01-22: 25 mg via INTRAVENOUS
  Filled 2018-01-22: qty 1

## 2018-01-22 MED ORDER — ROSUVASTATIN CALCIUM 5 MG PO TABS
5.0000 mg | ORAL_TABLET | Freq: Every day | ORAL | Status: DC
  Administered 2018-01-22 – 2018-01-24 (×3): 5 mg via ORAL
  Filled 2018-01-22 (×4): qty 1

## 2018-01-22 MED ORDER — VENLAFAXINE HCL ER 150 MG PO CP24
150.0000 mg | ORAL_CAPSULE | Freq: Every day | ORAL | Status: DC
  Administered 2018-01-22 – 2018-01-24 (×3): 150 mg via ORAL
  Filled 2018-01-22 (×3): qty 1

## 2018-01-22 MED ORDER — METOCLOPRAMIDE HCL 5 MG/ML IJ SOLN
10.0000 mg | Freq: Three times a day (TID) | INTRAMUSCULAR | Status: DC
  Administered 2018-01-22 – 2018-01-24 (×8): 10 mg via INTRAVENOUS
  Filled 2018-01-22 (×8): qty 2

## 2018-01-22 NOTE — ED Notes (Addendum)
Four runs of potassium have been given. Per pharmacy, initial order has been fulfilled however duplicate orders were entered which account for outstanding orders at this time.

## 2018-01-22 NOTE — Care Management Note (Signed)
Case Management Note  Patient Details  Name: [name] [name] MRN: [id number] Date of Birth: Aug 04, 1981  Subjective/Objective:                  dka and abd pain  Action/Plan: Date: January 22, 2018 Velva Harman, BSN, [place], [place] Chart and notes review for patient progress and needs. Will follow for case management and discharge needs. No cm or discharge needs present at time of this review. Next review date: [id number]  Expected Discharge Date:                  Expected Discharge Plan:  Home/Self Care  In-House Referral:     Discharge planning Services  CM Consult  Post Acute Care Choice:    Choice offered to:     DME Arranged:    DME Agency:     HH Arranged:    HH Agency:     Status of Service:  In process, will continue to follow  If discussed at Long Length of Stay Meetings, dates discussed:    Additional Comments:  Leeroy Cha, RN 01/22/2018, 8:43 AM

## 2018-01-22 NOTE — H&P (Signed)
History and Physical    [name] [name] YJE:[id number] DOB: 06/05/1981 DOA: 01/21/2018  PCP: Kathrine Haddock, NP  Patient coming from: Home  Chief Complaint: Nausea vomiting  HPI: [name] [name] is a 37 y.o. female with medical history significant of  gastroparesis that is not been well controlled since Christmas of last year, diabetes, coronary artery disease, chronic kidney disease comes in with days of nausea vomiting and abdominal pain that is not relieved with her home medications including Reglan and Protonix.  Patient reports she has not able to hold anything down.  She denies any blood in her vomit.  She denies any fevers or chills.  She does have diarrhea chronically this is not changed.  She has been referred to [facility] gastroparesis specialist for evaluation soon from our GI team here.  Patient found to have a sugar of almost 500 and mildly acidotic with an anion gap of 17.  She is referred for intractable nausea vomiting with associated DKA.  He is having dysuria.  She has frequent UTIs.  Review of Systems: As per HPI otherwise 10 point review of systems negative.   Past Medical History:  Diagnosis Date  . Chronic systolic CHF (congestive heart failure) ([place])    a. echo 03/2015: EF 30-35%, mild concentric LVH, severe HK of inf, inflat, & lat walls, mod MR, mild TR;  b. 04/2015 LV Gram: EF 55-65%.  . Diabetes type 2, controlled ([place])    a. since 2002   . Diverticulosis, sigmoid 07/2016  . Heavy menses    a. H/O IUD - expired in 2014 - remains in place.  . Hyperlipidemia   . Hypertension   . Iron deficiency anemia   . Ischemic cardiomyopathy    a. 03/2015 EF 30-35% post NSTEMI;  b. 04/2015 EF 55-65% on LV gram.  . Kidney disease   . MI (myocardial infarction) ([place]) 03/29/2015  . Obesity   . Renal insufficiency   . Tobacco abuse    a. quit 03/2015.  Marland Kitchen Uterine fibroid   . Vertigo   . Vitamin D deficiency     Past Surgical History:  Procedure Laterality Date  . APPENDECTOMY    .  CARDIAC CATHETERIZATION  4/16   x2 stent [facility]  . CARDIAC CATHETERIZATION N/A 05/05/2015   Procedure: Left Heart Cath and Coronary Angiography;  Surgeon: Peter M Martinique, MD;  Location: [facility];  Service: Cardiovascular;  Laterality: N/A;  . COLONOSCOPY WITH PROPOFOL N/A 07/31/2016   Procedure: COLONOSCOPY WITH PROPOFOL;  Surgeon: Lollie Sails, MD;  Location: [facility] ENDOSCOPY;  Service: Endoscopy;  Laterality: N/A;  . ESOPHAGOGASTRODUODENOSCOPY (EGD) WITH PROPOFOL N/A 07/31/2016   Procedure: ESOPHAGOGASTRODUODENOSCOPY (EGD) WITH PROPOFOL;  Surgeon: Lollie Sails, MD;  Location: [facility] ENDOSCOPY;  Service: Endoscopy;  Laterality: N/A;  . LAPAROSCOPIC APPENDECTOMY N/A 08/07/2016   Procedure: APPENDECTOMY LAPAROSCOPIC;  Surgeon: Hubbard Robinson, MD;  Location: [facility] ORS;  Service: General;  Laterality: N/A;  . MOUTH SURGERY       reports that she quit smoking about 2 years ago. She quit after 15.00 years of use. she has never used smokeless tobacco. She reports that she does not drink alcohol or use drugs.  Allergies  Allergen Reactions  . Lisinopril Anaphylaxis, Itching and Swelling  . Rosemary Oil Anaphylaxis  . Shellfish Allergy Itching and Swelling  . Metformin And Related Diarrhea, Nausea And Vomiting and Rash    Family History  Problem Relation Age of Onset  . Diabetes Father   . Cancer  Maternal Grandmother        lung  . Cancer Maternal Grandfather        prostate  . Diabetes Paternal Grandfather   . Diabetes Paternal Grandmother   . Cancer - Cervical Maternal Aunt     Prior to Admission medications   Medication Sig Start Date End Date Taking? Authorizing Provider  albuterol (PROVENTIL HFA;VENTOLIN HFA) 108 (90 Base) MCG/ACT inhaler Inhale 2 puffs into the lungs every 6 (six) hours as needed for wheezing or shortness of breath. 02/19/17  Yes Volney American, PA-C  amitriptyline (ELAVIL) 10 MG tablet Take 1-2 tablets (10-20 mg total) by mouth at bedtime.  Take 2 tablets at bedtime. Total of 20mg  nightly Patient taking differently: Take 30 mg at bedtime by mouth.  05/15/17  Yes Armbruster, Carlota Raspberry, MD  amoxicillin (AMOXIL) 875 MG tablet Take 1 tablet (875 mg total) by mouth 2 (two) times daily. 01/12/18  Yes Kathrine Haddock, NP  aspirin EC 81 MG tablet Take 81 mg by mouth daily.   Yes [provider]  benzonatate (TESSALON) 100 MG capsule Take 1 capsule (100 mg total) by mouth 2 (two) times daily as needed for cough. 01/07/18  Yes Kathrine Haddock, NP  Cholecalciferol 2000 units TABS Take 1 tablet by mouth daily.   Yes [provider]  dicyclomine (BENTYL) 20 MG tablet Take 1 tablet (20 mg total) by mouth every 6 (six) hours. 01/09/18  Yes Kathrine Haddock, NP  Dulaglutide (TRULICITY) 1.5 OI/7.1IW SOPN Inject 1.5 mg into the skin once a week. 09/29/17  Yes Kathrine Haddock, NP  etonogestrel (NEXPLANON) 68 MG IMPL implant 1 each by Subdermal route once.   Yes [provider]  ferrous sulfate 325 (65 FE) MG tablet TAKE ONE TABLET BY MOUTH THREE TIMES DAILY WITH MEALS Patient taking differently: take one tablet by mouth once daily 03/03/17  Yes Kathrine Haddock, NP  glimepiride (AMARYL) 4 MG tablet Take 1 tablet (4 mg total) by mouth daily. 12/17/17  Yes Kathrine Haddock, NP  Insulin Glargine (LANTUS SOLOSTAR) 100 UNIT/ML Solostar Pen Inject 60 Units into the skin daily at 10 pm. 10/02/17  Yes Kathrine Haddock, NP  isosorbide mononitrate (IMDUR) 30 MG 24 hr tablet TAKE 1 TABLET BY MOUTH ONCE DAILY 10/06/17  Yes Wellington Hampshire, MD  loperamide (IMODIUM) 2 MG capsule Take 2 mg by mouth as needed for diarrhea or loose stools.   Yes [provider]  meclizine (ANTIVERT) 32 MG tablet Take 1 tablet (32 mg total) by mouth 3 (three) times daily as needed. Patient taking differently: Take 32 mg by mouth 3 (three) times daily as needed for dizziness or nausea.  06/21/16  Yes Kathrine Haddock, NP  metoCLOPramide (REGLAN) 10 MG tablet Take 1 tablet (10  mg total) by mouth 3 (three) times daily before meals. 06/02/17  Yes Armbruster, Carlota Raspberry, MD  metoprolol tartrate (LOPRESSOR) 50 MG tablet Take 1 tablet (50 mg total) by mouth 2 (two) times daily. Need office visit 01/07/18  Yes [name] A, FNP  nitroGLYCERIN (NITROSTAT) 0.4 MG SL tablet Place 1 tablet (0.4 mg total) under the tongue every 5 (five) minutes as needed for chest pain. Reported on 02/20/2016 03/17/17  Yes Hackney, Otila Kluver A, FNP  pantoprazole (PROTONIX) 40 MG tablet TAKE ONE TABLET BY MOUTH ONCE DAILY AT 6 AM. 12/17/17  Yes Kathrine Haddock, NP  promethazine (PHENERGAN) 25 MG suppository Place 1 suppository (25 mg total) rectally every 6 (six) hours as needed for nausea or vomiting.  12/22/17  Yes Loney Hering, MD  promethazine (PHENERGAN) 25 MG tablet Take 1 tablet (25 mg total) every 8 (eight) hours as needed by mouth for nausea or vomiting. 10/27/17  Yes Kathrine Haddock, NP  rosuvastatin (CRESTOR) 5 MG tablet TAKE ONE TABLET BY MOUTH ONCE DAILY 06/02/17  Yes Wellington Hampshire, MD  venlafaxine XR (EFFEXOR-XR) 150 MG 24 hr capsule Take 150 mg by mouth daily with breakfast.    Yes [provider]  glucose blood (ACCU-CHEK ACTIVE STRIPS) test strip Use as instructed 03/25/16   Kathrine Haddock, NP  Insulin Pen Needle 32G X 4 MM MISC 1 Units by Does not apply route every morning. Pen needles 08/27/17   Kathrine Haddock, NP  ondansetron (ZOFRAN ODT) 8 MG disintegrating tablet Take 1 tablet (8 mg total) by mouth every 8 (eight) hours as needed for nausea or vomiting. Patient not taking: Reported on 01/09/2018 05/15/17   Yetta Flock, MD  rifaximin (XIFAXAN) 550 MG TABS tablet Take 1 tablet (550 mg total) by mouth 3 (three) times daily. Patient not taking: Reported on 09/29/2017 06/20/17   Yetta Flock, MD  sulfamethoxazole-trimethoprim (BACTRIM DS,SEPTRA DS) 800-160 MG tablet Take 1 tablet by mouth 2 (two) times daily. Patient not taking: Reported on 01/21/2018 01/09/18   Kathrine Haddock, NP    Physical Exam: Vitals:   01/21/18 2004 01/22/18 0003  BP: (!) 180/122 (!) 170/114  Pulse: 100 (!) 109  Resp: 16 20  Temp: (!) 97.3 F (36.3 C) 97.9 F (36.6 C)  TempSrc: Oral Oral  SpO2: 100% 100%      Constitutional: NAD, calm, comfortable Vitals:   01/21/18 2004 01/22/18 0003  BP: (!) 180/122 (!) 170/114  Pulse: 100 (!) 109  Resp: 16 20  Temp: (!) 97.3 F (36.3 C) 97.9 F (36.6 C)  TempSrc: Oral Oral  SpO2: 100% 100%   Eyes: PERRL, lids and conjunctivae normal ENMT: Mucous membranes are moist. Posterior pharynx clear of any exudate or lesions.Normal dentition.  Neck: normal, supple, no masses, no thyromegaly Respiratory: clear to auscultation bilaterally, no wheezing, no crackles. Normal respiratory effort. No accessory muscle use.  Cardiovascular: Regular rate and rhythm, no murmurs / rubs / gallops. No extremity edema. 2+ pedal pulses. No carotid bruits.  Abdomen: no tenderness, no masses palpated. No hepatosplenomegaly. Bowel sounds positive.  Musculoskeletal: no clubbing / cyanosis. No joint deformity upper and lower extremities. Good ROM, no contractures. Normal muscle tone.  Skin: no rashes, lesions, ulcers. No induration Neurologic: CN 2-12 grossly intact. Sensation intact, DTR normal. Strength 5/5 in all 4.  Psychiatric: Normal judgment and insight. Alert and oriented x 3. Normal mood.    Labs on Admission: I have personally reviewed following labs and imaging studies  CBC: Recent Labs  Lab 01/21/18 2144  WBC 11.8*  HGB 11.9*  HCT 35.5*  MCV 78.0  PLT 144   Basic Metabolic Panel: Recent Labs  Lab 01/21/18 2144  NA 136  K 3.3*  CL 100*  CO2 19*  GLUCOSE 455*  BUN 18  CREATININE 1.13*  CALCIUM 8.8*   GFR: Estimated Creatinine Clearance: 80.6 mL/min (A) (by C-G formula based on SCr of 1.13 mg/dL (H)). Liver Function Tests: Recent Labs  Lab 01/21/18 2144  AST 18  ALT 12*  ALKPHOS 126  BILITOT 0.6  PROT 7.6  ALBUMIN 2.9*    Recent Labs  Lab 01/21/18 2144  LIPASE 21   No results for input(s): AMMONIA in the last 168 hours. Coagulation Profile:  No results for input(s): INR, PROTIME in the last 168 hours. Cardiac Enzymes: No results for input(s): CKTOTAL, CKMB, CKMBINDEX, TROPONINI in the last 168 hours. BNP (last 3 results) No results for input(s): PROBNP in the last 8760 hours. HbA1C: No results for input(s): HGBA1C in the last 72 hours. CBG: No results for input(s): GLUCAP in the last 168 hours. Lipid Profile: No results for input(s): CHOL, HDL, LDLCALC, TRIG, CHOLHDL, LDLDIRECT in the last 72 hours. Thyroid Function Tests: No results for input(s): TSH, T4TOTAL, FREET4, T3FREE, THYROIDAB in the last 72 hours. Anemia Panel: No results for input(s): VITAMINB12, FOLATE, FERRITIN, TIBC, IRON, RETICCTPCT in the last 72 hours. Urine analysis:    Component Value Date/Time   COLORURINE YELLOW 01/21/2018 2208   APPEARANCEUR HAZY (A) 01/21/2018 2208   APPEARANCEUR Turbid (A) 01/09/2018 1353   LABSPEC 1.021 01/21/2018 2208   LABSPEC 1.023 03/29/2015 2345   PHURINE 7.0 01/21/2018 2208   GLUCOSEU >=500 (A) 01/21/2018 2208   GLUCOSEU >=500 03/29/2015 2345   HGBUR MODERATE (A) 01/21/2018 2208   BILIRUBINUR NEGATIVE 01/21/2018 2208   BILIRUBINUR Negative 01/09/2018 1353   BILIRUBINUR Negative 03/29/2015 2345   KETONESUR 20 (A) 01/21/2018 2208   PROTEINUR >=300 (A) 01/21/2018 2208   NITRITE NEGATIVE 01/21/2018 2208   LEUKOCYTESUR MODERATE (A) 01/21/2018 2208   LEUKOCYTESUR Trace (A) 01/09/2018 1353   LEUKOCYTESUR 3+ 03/29/2015 2345   Sepsis Labs: !!!!!!!!!!!!!!!!!!!!!!!!!!!!!!!!!!!!!!!!!!!! @LABRCNTIP (procalcitonin:4,lacticidven:4) )No results found for this or any previous visit (from the past 240 hour(s)).   Radiological Exams on Admission: No results found.  Old chart reviewed  Discussed with EDP  Assessment/Plan 37 year old female with DKA in uncontrolled gastroparesis Principal Problem:    Gastroparesis-IV Reglan.  Along with IV Protonix.  Placed on GI cocktail.  Treat UTI.  IV fluids.  Treat DKA.  Abdominal exam is benign.  Active Problems:   DKA (diabetic ketoacidoses) (HCC)-placed on DKA pathway.  Check BMPs every 4 hours.  Placed on insulin drip.  Hourly glucose checks.  Her DKA should resolve probably before morning in the next several hours.  Getting a liter of IV fluid normal saline bolus in the ED.    Acute lower UTI-placed on IV Rocephin.  Urine culture obtained.  I have reviewed previous micro cultures most recent not growing anything out specifically    Coronary artery disease involving native coronary artery of native heart with angina pectoris with documented spasm (HCC)-noted continue aspirin    Chronic diastolic heart failure (HCC)-noted patient currently compensated    Chronic abdominal pain-patient chronically on Ultram.  This may be contributing to her overall GI issues.  Obtain urine drug screen.    DVT prophylaxis: SCDs Code Status: Full Family Communication: None Disposition Plan: Per day team Consults called: None Admission status: Observation   [name] [name] A MD [facility]  If 7PM-7AM, please contact night-coverage [email or web address] Password [facility]  01/22/2018, 12:48 AM

## 2018-01-22 NOTE — Progress Notes (Signed)
Inpatient Diabetes Program Recommendations  AACE/ADA: New Consensus Statement on Inpatient Glycemic Control (2015)  Target Ranges:  Prepandial:   less than 140 mg/dL      Peak postprandial:   less than 180 mg/dL (1-2 hours)      Critically ill patients:  140 - 180 mg/dL   Lab Results  Component Value Date   GLUCAP 160 (H) 01/22/2018   HGBA1C 11.5 (H) 01/22/2018    Review of Glycemic Control  Diabetes history: DM2 Outpatient Diabetes medications: Trulicity 1.5 mg 1x/week, Amaryl 4 mg QD, Lantus 60 units QHS Current orders for Inpatient glycemic control: Lantus 50 units QD, Novolog 0-15 units tidwc  AG closed and CO2 is 22. Lantus 50 units given 2 hours prior to discontinuation of insulin drip. HgbA1C - 11.5% - Poor glycemic control. Needs OP medication adjustment.  Inpatient Diabetes Program Recommendations:     Change Novolog to 0-15 units Q4H with poor po intake OP Diabetes Education for uncontrolled DM  Spoke with pt regarding her glucose control at home. Pt states she had not taken her Lantus since she had been nauseated and vomiting. Discussed Sick Day Guidelines and importance of taking insulin even when sick. Interested in OP Diabetes Education with HgbA1C being 11.5%. Checks blood sugars several times/day at home. States HgbA1C had been around 9% at last endo visit. Will order same.  Will follow closely.  Thank you. Lorenda Peck, RD, LDN, CDE Inpatient Diabetes Coordinator [phone]

## 2018-01-22 NOTE — Progress Notes (Signed)
PROGRESS NOTE    [name] [name]  DZH:[id number] DOB: 1981-04-27 DOA: 01/21/2018 PCP: Kathrine Haddock, NP  Brief Narrative:37 y.o. female with medical history significant of  gastroparesis that is not been well controlled since Christmas of last year, diabetes, coronary artery disease, chronic kidney disease comes in with days of nausea vomiting and abdominal pain that is not relieved with her home medications including Reglan and Protonix.  Patient reports she has not able to hold anything down.  She denies any blood in her vomit.  She denies any fevers or chills.  She does have diarrhea chronically this is not changed.  She has been referred to [facility] gastroparesis specialist for evaluation soon from our GI team here.  Patient found to have a sugar of almost 500 and mildly acidotic with an anion gap of 17.  She is referred for intractable nausea vomiting with associated DKA.  He is having dysuria.  She has frequent UTIs.    Assessment & Plan:   Principal Problem:   Gastroparesis Active Problems:   Coronary artery disease involving native coronary artery of native heart with angina pectoris with documented spasm (HCC)   Chronic diastolic heart failure (HCC)   Chronic abdominal pain   DKA (diabetic ketoacidoses) (HCC)   Acute lower UTI  1]DKA- Gap closed,blood sugar normalizing..will continue ivf.start lantus ,diet.dc drip.follow up labs tomorrow.restart amaryl tomorrow to make sure shes able to tolerate po intake.  2]gastroparesis- continue reglan,bentyl.add dilaudid.[facility] up at [facility].Restart ultram.  3]UTI culture pending.on rocephin.  4]CAD continue aspirin.   DVT prophylaxis: scd Code Status full Family Communication: none Disposition Plan:tbd Consultants:  none Procedures none Antimicrobials:rocephin Subjective:complains of abdominal pain  Objective: Vitals:   01/22/18 0700 01/22/18 0731 01/22/18 0846 01/22/18 1124  BP: (!) 164/107 (!) 183/111 (!) 165/103 (!) 101/33  Pulse:  96 95 96 94  Resp: 14 16 13 13   Temp:      TempSrc:      SpO2: 100% 100% 100% 100%    Intake/Output Summary (Last 24 hours) at 01/22/2018 1157 Last data filed at 01/22/2018 1100 Gross per 24 hour  Intake 3547.3 ml  Output 600 ml  Net 2947.3 ml   There were no vitals filed for this visit.  Examination:  General exam: Appears calm and comfortable  Respiratory system: Clear to auscultation. Respiratory effort normal. Cardiovascular system: S1 & S2 heard, RRR. No JVD, murmurs, rubs, gallops or clicks. No pedal edema. Gastrointestinal system: Abdomen is nondistended, soft and tender. No organomegaly or masses felt. Normal bowel sounds heard. Central nervous system: Alert and oriented. No focal neurological deficits. Extremities: Symmetric 5 x 5 power. Skin: No rashes, lesions or ulcers Psychiatry: Judgement and insight appear normal. Mood & affect appropriate.     Data Reviewed: I have personally reviewed following labs and imaging studies  CBC: Recent Labs  Lab 01/21/18 2144  WBC 11.8*  HGB 11.9*  HCT 35.5*  MCV 78.0  PLT 419   Basic Metabolic Panel: Recent Labs  Lab 01/21/18 2144 01/22/18 0113 01/22/18 0934  NA 136 136 139  K 3.3* 3.9 3.9  CL 100* 102 109  CO2 19* 20* 22  GLUCOSE 455* 453* 126*  BUN 18 17 15   CREATININE 1.13* 1.11* 1.04*  CALCIUM 8.8* 8.5* 8.2*  MG  --  1.2*  --    GFR: Estimated Creatinine Clearance: 87.6 mL/min (A) (by C-G formula based on SCr of 1.04 mg/dL (H)). Liver Function Tests: Recent Labs  Lab 01/21/18 2144  AST 18  ALT 12*  ALKPHOS 126  BILITOT 0.6  PROT 7.6  ALBUMIN 2.9*   Recent Labs  Lab 01/21/18 2144  LIPASE 21   No results for input(s): AMMONIA in the last 168 hours. Coagulation Profile: No results for input(s): INR, PROTIME in the last 168 hours. Cardiac Enzymes: No results for input(s): CKTOTAL, CKMB, CKMBINDEX, TROPONINI in the last 168 hours. BNP (last 3 results) No results for input(s): PROBNP in the  last 8760 hours. HbA1C: Recent Labs    01/22/18 0113  HGBA1C 11.5*   CBG: Recent Labs  Lab 01/22/18 0609 01/22/18 0717 01/22/18 0837 01/22/18 0947 01/22/18 1055  GLUCAP 266* 213* 150* 103* 154*   Lipid Profile: No results for input(s): CHOL, HDL, LDLCALC, TRIG, CHOLHDL, LDLDIRECT in the last 72 hours. Thyroid Function Tests: No results for input(s): TSH, T4TOTAL, FREET4, T3FREE, THYROIDAB in the last 72 hours. Anemia Panel: No results for input(s): VITAMINB12, FOLATE, FERRITIN, TIBC, IRON, RETICCTPCT in the last 72 hours. Sepsis Labs: No results for input(s): PROCALCITON, LATICACIDVEN in the last 168 hours.  No results found for this or any previous visit (from the past 240 hour(s)).       Radiology Studies: Dg Chest Port 1 View  Result Date: 01/22/2018 CLINICAL DATA:  Chest pain and tightness. EXAM: PORTABLE CHEST 1 VIEW COMPARISON:  Chest radiograph October 08, 2017 FINDINGS: Cardiomediastinal silhouette is unremarkable for this low inspiratory examination with crowded vasculature markings. The lungs are clear without pleural effusions or focal consolidations. Trachea projects midline and there is no pneumothorax. Included soft tissue planes and osseous structures are non-suspicious. IMPRESSION: No active disease. Electronically Signed   By: Elon Alas M.D.   On: 01/22/2018 05:16        Scheduled Meds: . amitriptyline  20 mg Oral QHS  . aspirin EC  81 mg Oral Daily  . cholecalciferol  2,000 Units Oral Daily  . dicyclomine  20 mg Oral Q6H  . [START ON 01/27/2018] Dulaglutide  1.5 mg Subcutaneous Weekly  . insulin glargine  50 Units Subcutaneous Daily  . isosorbide mononitrate  30 mg Oral Daily  . metoCLOPramide (REGLAN) injection  10 mg Intravenous Q8H  . metoprolol tartrate  50 mg Oral BID  . pantoprazole (PROTONIX) IV  40 mg Intravenous Q12H  . rosuvastatin  5 mg Oral Daily  . venlafaxine XR  150 mg Oral Q breakfast   Continuous Infusions: . sodium  chloride    . cefTRIAXone (ROCEPHIN)  IV Stopped (01/22/18 0241)  . dextrose 5 % and 0.45% NaCl 100 mL/hr at 01/22/18 0841  . insulin (NOVOLIN-R) infusion 4.5 Units/hr (01/22/18 1058)     LOS: 0 days     Georgette Shell, MD [facility]  If 7PM-7AM, please contact night-coverage [email or web address] Password [facility] 01/22/2018, 11:57 AM

## 2018-01-23 LAB — BASIC METABOLIC PANEL
Anion gap: 8 (ref 5–15)
BUN: 12 mg/dL (ref 6–20)
CO2: 21 mmol/L — ABNORMAL LOW (ref 22–32)
Calcium: 8.1 mg/dL — ABNORMAL LOW (ref 8.9–10.3)
Chloride: 112 mmol/L — ABNORMAL HIGH (ref 101–111)
Creatinine, Ser: 1.02 mg/dL — ABNORMAL HIGH (ref 0.44–1.00)
GFR calc Af Amer: >60 mL/min (ref >60)
GFR calc non Af Amer: >60 mL/min (ref >60)
Glucose, Bld: 113 mg/dL — ABNORMAL HIGH (ref 65–99)
Potassium: 3.4 mmol/L — ABNORMAL LOW (ref 3.5–5.1)
Sodium: 141 mmol/L (ref 135–145)

## 2018-01-23 LAB — GLUCOSE, CAPILLARY
Glucose-Capillary: 93 mg/dL (ref 65–99)
Glucose-Capillary: 114 mg/dL — ABNORMAL HIGH (ref 65–99)
Glucose-Capillary: 144 mg/dL — ABNORMAL HIGH (ref 65–99)
Glucose-Capillary: 184 mg/dL — ABNORMAL HIGH (ref 65–99)
Glucose-Capillary: 135 mg/dL — ABNORMAL HIGH (ref 65–99)

## 2018-01-23 MED ORDER — HYDRALAZINE HCL 20 MG/ML IJ SOLN
10.0000 mg | INTRAMUSCULAR | Status: DC | PRN
  Administered 2018-01-23: 10 mg via INTRAVENOUS
  Filled 2018-01-23: qty 1

## 2018-01-23 MED ORDER — ISOSORBIDE MONONITRATE ER 60 MG PO TB24
60.0000 mg | ORAL_TABLET | Freq: Every day | ORAL | Status: DC
  Administered 2018-01-23 – 2018-01-24 (×2): 60 mg via ORAL
  Filled 2018-01-23 (×2): qty 1

## 2018-01-23 MED ORDER — POTASSIUM CHLORIDE 10 MEQ/100ML IV SOLN
10.0000 meq | INTRAVENOUS | Status: AC
  Administered 2018-01-23 (×3): 10 meq via INTRAVENOUS
  Filled 2018-01-23: qty 100

## 2018-01-23 MED ORDER — METOPROLOL TARTRATE 50 MG PO TABS
100.0000 mg | ORAL_TABLET | Freq: Two times a day (BID) | ORAL | Status: DC
  Administered 2018-01-23 – 2018-01-24 (×3): 100 mg via ORAL
  Filled 2018-01-23: qty 2
  Filled 2018-01-23: qty 4
  Filled 2018-01-23: qty 2

## 2018-01-23 MED ORDER — PROMETHAZINE HCL 25 MG/ML IJ SOLN: 25.0000 mg | Freq: Four times a day (QID) | INTRAMUSCULAR | Status: DC | PRN

## 2018-01-23 NOTE — Progress Notes (Signed)
PROGRESS NOTE    [name] [name]  GMW:[id number] DOB: 07-16-1981 DOA: 01/21/2018 PCP: Kathrine Haddock, NP   Brief Narrative:37 y.o.femalewith medical history significant ofgastroparesis that is not been well controlled since Christmas of last year, diabetes, coronary artery disease, chronic kidney disease comes in with days of nausea vomiting and abdominal pain that is not relieved with her home medications including Reglan and Protonix. Patient reports she has not able to hold anything down. She denies any blood in her vomit. She denies any fevers or chills. She does have diarrhea chronically this is not changed. She has been referred to [facility] gastroparesis specialist for evaluation soon from our GI team here. Patient found to have a sugar of almost 500 and mildly acidotic with an anion gap of 17. She is referred for intractable nausea vomiting with associated DKA. He is having dysuria. She has frequent UTIs.      Assessment & Plan:   Principal Problem:   Gastroparesis Active Problems:   Coronary artery disease involving native coronary artery of native heart with angina pectoris with documented spasm (HCC)   Chronic diastolic heart failure (HCC)   Chronic abdominal pain   DKA (diabetic ketoacidoses) ([place])   Acute lower UTI  1]DKA-resolved patient blood sugars have been stable overnight anywhere from 93-114.  Patient still has very decreased p.o. intake.  I have encouraged is to start at least eating and drinking some full liquid diet before she can go to a regular diet.  She reports her abdominal pain is better nausea is better.  Increase activity out of bed to ambulate as tolerated.  2]gastroparesis- continue reglan,bentyl.add dilaudid.follow up at [facility].Restart ultram.  3]UTI culture pending.on rocephin.  4]CAD continue aspirin.  5] hypertension blood pressure is elevated.  Increase the dose of Imdur and beta-blocker.  6] hypokalemia replete potassium recheck labs  tomorrow.       DVT prophylaxis: SCD Code Status: Full code Family Communication: No family available Disposition Plan: TBD  Consultants:   None  Procedures: None Antimicrobials: Rocephin Subjective: Feels better but still has abdominal pain.   Objective: Vitals:   01/23/18 0400 01/23/18 0446 01/23/18 0505 01/23/18 0718  BP: (!) 194/106 (!) 179/103 (!) 186/101   Pulse: 88 83 86   Resp: 11 13 11    Temp:    98.3 F (36.8 C)  TempSrc:    Oral  SpO2: 100% 100% 100%   Weight:      Height:        Intake/Output Summary (Last 24 hours) at 01/23/2018 0947 Last data filed at 01/23/2018 0500 Gross per 24 hour  Intake 3855.73 ml  Output 1350 ml  Net 2505.73 ml   Filed Weights   01/22/18 2351  Weight: 107.2 kg (236 lb 5.3 oz)    Examination:  General exam: Appears calm and comfortable  Respiratory system: Clear to auscultation. Respiratory effort normal. Cardiovascular system: S1 & S2 heard, RRR. No JVD, murmurs, rubs, gallops or clicks. No pedal edema. Gastrointestinal system: Abdomen is nondistended, soft and tender. No organomegaly or masses felt. Normal bowel sounds heard. Central nervous system: Alert and oriented. No focal neurological deficits. Extremities: Symmetric 5 x 5 power. Skin: No rashes, lesions or ulcers Psychiatry: Judgement and insight appear normal. Mood & affect appropriate.     Data Reviewed: I have personally reviewed following labs and imaging studies  CBC: Recent Labs  Lab 01/21/18 2144  WBC 11.8*  HGB 11.9*  HCT 35.5*  MCV 78.0  PLT 440   Basic Metabolic  Panel: Recent Labs  Lab 01/21/18 2144 01/22/18 0113 01/22/18 0934 01/23/18 0304  NA 136 136 139 141  K 3.3* 3.9 3.9 3.4*  CL 100* 102 109 112*  CO2 19* 20* 22 21*  GLUCOSE 455* 453* 126* 113*  BUN 18 17 15 12   CREATININE 1.13* 1.11* 1.04* 1.02*  CALCIUM 8.8* 8.5* 8.2* 8.1*  MG  --  1.2*  --   --    GFR: Estimated Creatinine Clearance: 91 mL/min (A) (by C-G formula  based on SCr of 1.02 mg/dL (H)). Liver Function Tests: Recent Labs  Lab 01/21/18 2144  AST 18  ALT 12*  ALKPHOS 126  BILITOT 0.6  PROT 7.6  ALBUMIN 2.9*   Recent Labs  Lab 01/21/18 2144  LIPASE 21   No results for input(s): AMMONIA in the last 168 hours. Coagulation Profile: No results for input(s): INR, PROTIME in the last 168 hours. Cardiac Enzymes: No results for input(s): CKTOTAL, CKMB, CKMBINDEX, TROPONINI in the last 168 hours. BNP (last 3 results) No results for input(s): PROBNP in the last 8760 hours. HbA1C: Recent Labs    01/22/18 0113  HGBA1C 11.5*   CBG: Recent Labs  Lab 01/22/18 1638 01/22/18 1933 01/22/18 2307 01/23/18 0303 01/23/18 0727  GLUCAP 154* 148* 120* 93 114*   Lipid Profile: No results for input(s): CHOL, HDL, LDLCALC, TRIG, CHOLHDL, LDLDIRECT in the last 72 hours. Thyroid Function Tests: No results for input(s): TSH, T4TOTAL, FREET4, T3FREE, THYROIDAB in the last 72 hours. Anemia Panel: No results for input(s): VITAMINB12, FOLATE, FERRITIN, TIBC, IRON, RETICCTPCT in the last 72 hours. Sepsis Labs: No results for input(s): PROCALCITON, LATICACIDVEN in the last 168 hours.  Recent Results (from the past 240 hour(s))  MRSA PCR Screening     Status: Abnormal   Collection Time: 01/22/18 10:01 AM  Result Value Ref Range Status   MRSA by PCR POSITIVE (A) NEGATIVE Final    Comment:        The GeneXpert MRSA Assay (FDA approved for NASAL specimens only), is one component of a comprehensive MRSA colonization surveillance program. It is not intended to diagnose MRSA infection nor to guide or monitor treatment for MRSA infections. RESULT CALLED TO, READ BACK BY AND VERIFIED WITH: JOHNSON,R @ 1220 ON [id number] BY POTEAT,S Performed at [facility], [place] [address], [place], [place] [address]          Radiology Studies: Dg Chest Port 1 View  Result Date: 01/22/2018 CLINICAL DATA:  Chest pain and tightness. EXAM:  PORTABLE CHEST 1 VIEW COMPARISON:  Chest radiograph October 08, 2017 FINDINGS: Cardiomediastinal silhouette is unremarkable for this low inspiratory examination with crowded vasculature markings. The lungs are clear without pleural effusions or focal consolidations. Trachea projects midline and there is no pneumothorax. Included soft tissue planes and osseous structures are non-suspicious. IMPRESSION: No active disease. Electronically Signed   By: Elon Alas M.D.   On: 01/22/2018 05:16        Scheduled Meds: . amitriptyline  20 mg Oral QHS  . aspirin EC  81 mg Oral Daily  . Chlorhexidine Gluconate Cloth  6 each Topical Q0600  . cholecalciferol  2,000 Units Oral Daily  . dicyclomine  20 mg Oral Q6H  . [START ON 01/27/2018] Dulaglutide  1.5 mg Subcutaneous Weekly  . insulin aspart  0-15 Units Subcutaneous Q4H  . insulin glargine  50 Units Subcutaneous Daily  . isosorbide mononitrate  30 mg Oral Daily  . metoCLOPramide (REGLAN) injection  10 mg Intravenous  Q8H  . metoprolol tartrate  50 mg Oral BID  . mupirocin ointment  1 application Nasal BID  . pantoprazole (PROTONIX) IV  40 mg Intravenous Q12H  . rosuvastatin  5 mg Oral Daily  . venlafaxine XR  150 mg Oral Q breakfast   Continuous Infusions: . sodium chloride 200 mL/hr at 01/23/18 0512  . cefTRIAXone (ROCEPHIN)  IV Stopped (01/22/18 2209)     LOS: 1 day       Georgette Shell, MD [facility] If 7PM-7AM, please contact night-coverage [email or web address] Password [facility] 01/23/2018, 9:47 AM

## 2018-01-23 NOTE — Progress Notes (Signed)
Nutrition Brief Note  Patient identified on the Malnutrition Screening Tool (MST) Report  Wt Readings from Last 15 Encounters:  01/22/18 236 lb 5.3 oz (107.2 kg)  01/09/18 232 lb (105.2 kg)  12/22/17 240 lb (108.9 kg)  10/27/17 239 lb 3.2 oz (108.5 kg)  10/08/17 234 lb (106.1 kg)  09/29/17 236 lb 9.6 oz (107.3 kg)  09/01/17 234 lb (106.1 kg)  08/27/17 230 lb 6.4 oz (104.5 kg)  08/18/17 235 lb 2 oz (106.7 kg)  08/12/17 231 lb 9.6 oz (105.1 kg)  08/03/17 231 lb 8 oz (105 kg)  05/16/17 228 lb (103.4 kg)  05/15/17 228 lb (103.4 kg)  04/23/17 220 lb (99.8 kg)  04/22/17 229 lb 8 oz (104.1 kg)    Body mass index is 39.33 kg/m. Patient meets criteria for obesity based on current BMI. Skin WDL. Pt with hx of DM with poorly controlled gastroparesis, CAD, CKD. She arrived to the ED with several days of N/V and abdominal pain that was not improved by home use of Reglan and Protonix. She was unable to hold anything down during those few days. Dr. Rodena Piety' note indicates that pt has been referred to [facility] gastroparesis specialist for evaluation. Pt with DKA at time of admission.   Current diet order is Regular. Will change to Carb Modified. Medications reviewed; sliding scale Novolog, 50 units Lantus/day, 10 mg IV Reglan TID, 40 mg IV Protonix BID, 10 mEq IV KCl x3 runs today. Labs reviewed; CBGs: 93 and 114 mg/dL, K: 3.4 mmol/L, Cl: 112 mmol/L, creatinine: 1.02 mg/dL, Ca: 8.1 mg/dL. IVF: NS @ 200 mL/hr.   No nutrition interventions warranted at this time. If nutrition issues arise, please consult RD.      Jarome Matin, MS, RD, LDN, [facility] Inpatient Clinical Dietitian Pager # [phone] After hours/weekend pager # [phone]

## 2018-01-23 NOTE — Progress Notes (Signed)
Patient arrived on unit via bed from stepdown.  Family at bedside.  Telemetry placed per MD order and CMT notified.

## 2018-01-24 LAB — CBC
HCT: 31.4 % — ABNORMAL LOW (ref 36.0–46.0)
Hemoglobin: 9.9 g/dL — ABNORMAL LOW (ref 12.0–15.0)
MCH: 25.6 pg — ABNORMAL LOW (ref 26.0–34.0)
MCHC: 31.5 g/dL (ref 30.0–36.0)
MCV: 81.3 fL (ref 78.0–100.0)
Platelets: 349 10*3/uL (ref 150–400)
RBC: 3.86 MIL/uL — ABNORMAL LOW (ref 3.87–5.11)
RDW: 14.8 % (ref 11.5–15.5)
WBC: 8.5 10*3/uL (ref 4.0–10.5)

## 2018-01-24 LAB — BASIC METABOLIC PANEL
Anion gap: 9 (ref 5–15)
BUN: 8 mg/dL (ref 6–20)
CO2: 20 mmol/L — ABNORMAL LOW (ref 22–32)
Calcium: 8.2 mg/dL — ABNORMAL LOW (ref 8.9–10.3)
Chloride: 113 mmol/L — ABNORMAL HIGH (ref 101–111)
Creatinine, Ser: 1.03 mg/dL — ABNORMAL HIGH (ref 0.44–1.00)
GFR calc Af Amer: >60 mL/min (ref >60)
GFR calc non Af Amer: >60 mL/min (ref >60)
Glucose, Bld: 128 mg/dL — ABNORMAL HIGH (ref 65–99)
Potassium: 3.3 mmol/L — ABNORMAL LOW (ref 3.5–5.1)
Sodium: 142 mmol/L (ref 135–145)

## 2018-01-24 LAB — URINE CULTURE: Culture: 1000 — AB

## 2018-01-24 LAB — GLUCOSE, CAPILLARY
Glucose-Capillary: 82 mg/dL (ref 65–99)
Glucose-Capillary: 83 mg/dL (ref 65–99)
Glucose-Capillary: 126 mg/dL — ABNORMAL HIGH (ref 65–99)
Glucose-Capillary: 111 mg/dL — ABNORMAL HIGH (ref 65–99)

## 2018-01-24 MED ORDER — POTASSIUM CHLORIDE CRYS ER 20 MEQ PO TBCR
40.0000 meq | EXTENDED_RELEASE_TABLET | Freq: Once | ORAL | Status: AC
  Administered 2018-01-24: 40 meq via ORAL
  Filled 2018-01-24: qty 2

## 2018-01-24 MED ORDER — MUPIROCIN 2 % EX OINT: 1.0000 "application " | TOPICAL_OINTMENT | Freq: Two times a day (BID) | CUTANEOUS | 0 refills | Status: DC

## 2018-01-24 NOTE — Discharge Summary (Addendum)
Physician Discharge Summary  Nicolle Heward TKZ:[id number] DOB: 06-Apr-1981 DOA: 01/21/2018  PCP: Kathrine Haddock, NP  Admit date: 01/21/2018 Discharge date: 01/24/2018  Admitted From: Home Disposition: Home none Recommendations for Outpatient Follow-up:  1. Follow up with PCP in 1-2 weeks 2. Please obtain BMP/CBC in one week  Home Health Equipment/Devices: None Discharge Condition: Stable CODE STATUS: Full code Diet recommendation: Cardiac modified carb  Brief/Interim Summary:37 y.o.femalewith medical history significant ofgastroparesis that is not been well controlled since Christmas of last year, diabetes, coronary artery disease, chronic kidney disease comes in with days of nausea vomiting and abdominal pain that is not relieved with her home medications including Reglan and Protonix. Patient reports she has not able to hold anything down. She denies any blood in her vomit. She denies any fevers or chills. She does have diarrhea chronically this is not changed. She has been referred to [facility] gastroparesis specialist for evaluation soon from our GI team here. Patient found to have a sugar of almost 500 and mildly acidotic with an anion gap of 17. She is referred for intractable nausea vomiting with associated DKA. He is having dysuria. She has frequent UTIs.     Discharge Diagnoses:  Principal Problem:   Gastroparesis Active Problems:   Coronary artery disease involving native coronary artery of native heart with angina pectoris with documented spasm (HCC)   Chronic diastolic heart failure (HCC)   Chronic abdominal pain   DKA (diabetic ketoacidoses) ([place])   Acute lower UTI 1]DKA-resolved patient blood sugars have been stable overnight anywhere from 93-114.  She is is eating better and is able to tolerate p.o. intake without any nausea vomiting or increasing abdominal pain.  Encourage patient to follow-up with [facility] gastroparesis center as an outpatient.  She was admitted to  the ICU and treated with IV fluids and IV insulin drip.  2]gastroparesis- continue reglan,bentyl  3]UTI culture no growth so far patient has received Rocephin since admission.  I will not discharge her home on any antibiotics at this time unless her culture comes back positive. 4]CAD continue aspirin.  5] hypertension blood pressure is elevated.  Increase the dose of Imdur and beta-blocker.  6] hypokalemia replete potassium        Discharge Instructions  Discharge Instructions    Call MD for:  difficulty breathing, headache or visual disturbances   Complete by:  As directed    Call MD for:  persistant nausea and vomiting   Complete by:  As directed    Call MD for:  severe uncontrolled pain   Complete by:  As directed    Call MD for:  temperature >100.4   Complete by:  As directed    Diet - low sodium heart healthy   Complete by:  As directed    Diet Carb Modified   Complete by:  As directed    Increase activity slowly   Complete by:  As directed      Allergies as of 01/24/2018      Reactions   Lisinopril Anaphylaxis, Itching, Swelling   Rosemary Oil Anaphylaxis   Shellfish Allergy Itching, Swelling   Metformin And Related Diarrhea, Nausea And Vomiting, Rash      Medication List    STOP taking these medications   amoxicillin 875 MG tablet Commonly known as:  AMOXIL   rifaximin 550 MG Tabs tablet Commonly known as:  XIFAXAN   sulfamethoxazole-trimethoprim 800-160 MG tablet Commonly known as:  BACTRIM DS,SEPTRA DS     TAKE these medications  albuterol 108 (90 Base) MCG/ACT inhaler Commonly known as:  PROVENTIL HFA;VENTOLIN HFA Inhale 2 puffs into the lungs every 6 (six) hours as needed for wheezing or shortness of breath.   amitriptyline 10 MG tablet Commonly known as:  ELAVIL Take 1-2 tablets (10-20 mg total) by mouth at bedtime. Take 2 tablets at bedtime. Total of 20mg  nightly What changed:    how much to take  additional instructions   aspirin  EC 81 MG tablet Take 81 mg by mouth daily.   benzonatate 100 MG capsule Commonly known as:  TESSALON Take 1 capsule (100 mg total) by mouth 2 (two) times daily as needed for cough.   Cholecalciferol 2000 units Tabs Take 1 tablet by mouth daily.   dicyclomine 20 MG tablet Commonly known as:  BENTYL Take 1 tablet (20 mg total) by mouth every 6 (six) hours.   Dulaglutide 1.5 MG/0.5ML Sopn Commonly known as:  TRULICITY Inject 1.5 mg into the skin once a week.   ferrous sulfate 325 (65 FE) MG tablet TAKE ONE TABLET BY MOUTH THREE TIMES DAILY WITH MEALS What changed:    how much to take  how to take this  when to take this   glimepiride 4 MG tablet Commonly known as:  AMARYL Take 1 tablet (4 mg total) by mouth daily.   glucose blood test strip Commonly known as:  ACCU-CHEK ACTIVE STRIPS Use as instructed   Insulin Glargine 100 UNIT/ML Solostar Pen Commonly known as:  LANTUS SOLOSTAR Inject 60 Units into the skin daily at 10 pm.   Insulin Pen Needle 32G X 4 MM Misc 1 Units by Does not apply route every morning. Pen needles   isosorbide mononitrate 30 MG 24 hr tablet Commonly known as:  IMDUR TAKE 1 TABLET BY MOUTH ONCE DAILY   loperamide 2 MG capsule Commonly known as:  IMODIUM Take 2 mg by mouth as needed for diarrhea or loose stools.   meclizine 32 MG tablet Commonly known as:  ANTIVERT Take 1 tablet (32 mg total) by mouth 3 (three) times daily as needed. What changed:  reasons to take this   metoCLOPramide 10 MG tablet Commonly known as:  REGLAN Take 1 tablet (10 mg total) by mouth 3 (three) times daily before meals.   metoprolol tartrate 50 MG tablet Commonly known as:  LOPRESSOR Take 1 tablet (50 mg total) by mouth 2 (two) times daily. Need office visit   mupirocin ointment 2 % Commonly known as:  BACTROBAN Place 1 application into the nose 2 (two) times daily.   NEXPLANON 68 MG Impl implant Generic drug:  etonogestrel 1 each by Subdermal route  once.   nitroGLYCERIN 0.4 MG SL tablet Commonly known as:  NITROSTAT Place 1 tablet (0.4 mg total) under the tongue every 5 (five) minutes as needed for chest pain. Reported on 02/20/2016   ondansetron 8 MG disintegrating tablet Commonly known as:  ZOFRAN ODT Take 1 tablet (8 mg total) by mouth every 8 (eight) hours as needed for nausea or vomiting.   pantoprazole 40 MG tablet Commonly known as:  PROTONIX TAKE ONE TABLET BY MOUTH ONCE DAILY AT 6 AM.   promethazine 25 MG tablet Commonly known as:  PHENERGAN Take 1 tablet (25 mg total) every 8 (eight) hours as needed by mouth for nausea or vomiting.   promethazine 25 MG suppository Commonly known as:  PHENERGAN Place 1 suppository (25 mg total) rectally every 6 (six) hours as needed for nausea or vomiting.   rosuvastatin  5 MG tablet Commonly known as:  CRESTOR TAKE ONE TABLET BY MOUTH ONCE DAILY   venlafaxine XR 150 MG 24 hr capsule Commonly known as:  EFFEXOR-XR Take 150 mg by mouth daily with breakfast.      Follow-up Information    Kathrine Haddock, NP Follow up.   Specialties:  Nurse Practitioner, Family Medicine Contact information: [address] [address] [phone]          Allergies  Allergen Reactions  . Lisinopril Anaphylaxis, Itching and Swelling  . Rosemary Oil Anaphylaxis  . Shellfish Allergy Itching and Swelling  . Metformin And Related Diarrhea, Nausea And Vomiting and Rash    Consultations: none Procedures/Studies: Dg Chest Port 1 View  Result Date: 01/22/2018 CLINICAL DATA:  Chest pain and tightness. EXAM: PORTABLE CHEST 1 VIEW COMPARISON:  Chest radiograph October 08, 2017 FINDINGS: Cardiomediastinal silhouette is unremarkable for this low inspiratory examination with crowded vasculature markings. The lungs are clear without pleural effusions or focal consolidations. Trachea projects midline and there is no pneumothorax. Included soft tissue planes and osseous structures are non-suspicious.  IMPRESSION: No active disease. Electronically Signed   By: Elon Alas M.D.   On: 01/22/2018 05:16    (Echo, Carotid, EGD, Colonoscopy, ERCP)    Subjective: Eating better  Discharge Exam: Vitals:   01/24/18 0008 01/24/18 0428  BP: (!) 152/84 (!) 157/83  Pulse: 84 86  Resp: 14 (!) 26  Temp: 98.1 F (36.7 C) 98.5 F (36.9 C)  SpO2: 100% 96%   Vitals:   01/23/18 1626 01/23/18 2016 01/24/18 0008 01/24/18 0428  BP: 135/73 132/80 (!) 152/84 (!) 157/83  Pulse: 90 84 84 86  Resp: 14 15 14  (!) 26  Temp: 98.8 F (37.1 C) 98.2 F (36.8 C) 98.1 F (36.7 C) 98.5 F (36.9 C)  TempSrc: Oral Oral Oral Oral  SpO2: 100% 96% 100% 96%  Weight:      Height:        General: Pt is alert, awake, not in acute distress Cardiovascular: RRR, S1/S2 +, no rubs, no gallops Respiratory: CTA bilaterally, no wheezing, no rhonchi Abdominal: Soft, NT, ND, bowel sounds + Extremities: no edema, no cyanosis    The results of significant diagnostics from this hospitalization (including imaging, microbiology, ancillary and laboratory) are listed below for reference.     Microbiology: Recent Results (from the past 240 hour(s))  Urine culture     Status: None (Preliminary result)   Collection Time: 01/21/18 11:49 PM  Result Value Ref Range Status   Specimen Description   Final    URINE, RANDOM Performed at [facility] [address], [place], [place] [address]    Special Requests   Final    NONE Performed at [facility], [place] [address] [place], [place] [address]    Culture   Final    CULTURE REINCUBATED FOR BETTER GROWTH Performed at [facility], [place] [address], [place], [place] [address]    Report Status PENDING  Incomplete  MRSA PCR Screening     Status: Abnormal   Collection Time: 01/22/18 10:01 AM  Result Value Ref Range Status   MRSA by PCR POSITIVE (A) NEGATIVE Final    Comment:        The GeneXpert MRSA Assay (FDA approved for  NASAL specimens only), is one component of a comprehensive MRSA colonization surveillance program. It is not intended to diagnose MRSA infection nor to guide or monitor treatment for MRSA infections. RESULT CALLED  TO, READ BACK BY AND VERIFIED WITH: JOHNSON,R @ [place] ON [id number] BY POTEAT,S Performed at [place] [address], [place], [place] [address]      Labs: BNP (last 3 results) Recent Labs    10/08/17 0448  BNP 44.9   Basic Metabolic Panel: Recent Labs  Lab 01/21/18 2144 01/22/18 0113 01/22/18 0934 01/23/18 0304 01/24/18 0600  NA 136 136 139 141 142  K 3.3* 3.9 3.9 3.4* 3.3*  CL 100* 102 109 112* 113*  CO2 19* 20* 22 21* 20*  GLUCOSE 455* 453* 126* 113* 128*  BUN 18 17 15 12 8   CREATININE 1.13* 1.11* 1.04* 1.02* 1.03*  CALCIUM 8.8* 8.5* 8.2* 8.1* 8.2*  MG  --  1.2*  --   --   --    Liver Function Tests: Recent Labs  Lab 01/21/18 2144  AST 18  ALT 12*  ALKPHOS 126  BILITOT 0.6  PROT 7.6  ALBUMIN 2.9*   Recent Labs  Lab 01/21/18 2144  LIPASE 21   No results for input(s): AMMONIA in the last 168 hours. CBC: Recent Labs  Lab 01/21/18 2144 01/24/18 0600  WBC 11.8* 8.5  HGB 11.9* 9.9*  HCT 35.5* 31.4*  MCV 78.0 81.3  PLT 392 349   Cardiac Enzymes: No results for input(s): CKTOTAL, CKMB, CKMBINDEX, TROPONINI in the last 168 hours. BNP: Invalid input(s): POCBNP CBG: Recent Labs  Lab 01/23/18 1537 01/23/18 2021 01/24/18 0006 01/24/18 0426 01/24/18 0804  GLUCAP 184* 135* 82 83 126*   D-Dimer No results for input(s): DDIMER in the last 72 hours. Hgb A1c Recent Labs    01/22/18 0113  HGBA1C 11.5*   Lipid Profile No results for input(s): CHOL, HDL, LDLCALC, TRIG, CHOLHDL, LDLDIRECT in the last 72 hours. Thyroid function studies No results for input(s): TSH, T4TOTAL, T3FREE, THYROIDAB in the last 72 hours.  Invalid input(s): FREET3 Anemia work up No results for input(s): VITAMINB12, FOLATE, FERRITIN, TIBC,  IRON, RETICCTPCT in the last 72 hours. Urinalysis    Component Value Date/Time   COLORURINE YELLOW 01/21/2018 2208   APPEARANCEUR HAZY (A) 01/21/2018 2208   APPEARANCEUR Turbid (A) 01/09/2018 1353   LABSPEC 1.021 01/21/2018 2208   LABSPEC 1.023 03/29/2015 2345   PHURINE 7.0 01/21/2018 2208   GLUCOSEU >=500 (A) 01/21/2018 2208   GLUCOSEU >=500 03/29/2015 2345   HGBUR MODERATE (A) 01/21/2018 2208   BILIRUBINUR NEGATIVE 01/21/2018 2208   BILIRUBINUR Negative 01/09/2018 1353   BILIRUBINUR Negative 03/29/2015 2345   KETONESUR 20 (A) 01/21/2018 2208   PROTEINUR >=300 (A) 01/21/2018 2208   NITRITE NEGATIVE 01/21/2018 2208   LEUKOCYTESUR MODERATE (A) 01/21/2018 2208   LEUKOCYTESUR Trace (A) 01/09/2018 1353   LEUKOCYTESUR 3+ 03/29/2015 2345   Sepsis Labs Invalid input(s): PROCALCITONIN,  WBC,  LACTICIDVEN Microbiology Recent Results (from the past 240 hour(s))  Urine culture     Status: None (Preliminary result)   Collection Time: 01/21/18 11:49 PM  Result Value Ref Range Status   Specimen Description   Final    URINE, RANDOM Performed at [facility], [place] [address], [place], [place] [address]    Special Requests   Final    NONE Performed at [facility], [place] [address] [place], [place] [address]    Culture   Final    CULTURE REINCUBATED FOR BETTER GROWTH Performed at [facility], [place] [address] [place],  [address]    Report Status PENDING  Incomplete  MRSA PCR Screening  Status: Abnormal   Collection Time: 01/22/18 10:01 AM  Result Value Ref Range Status   MRSA by PCR POSITIVE (A) NEGATIVE Final    Comment:        The GeneXpert MRSA Assay (FDA approved for NASAL specimens only), is one component of a comprehensive MRSA colonization surveillance program. It is not intended to diagnose MRSA infection nor to guide or monitor treatment for MRSA infections. RESULT CALLED TO, READ BACK BY AND VERIFIED  WITH: JOHNSON,R @ 1220 ON [id number] BY POTEAT,S Performed at [facility], [place] [address], [place], [place] [address]      Time coordinating discharge: Over 30 minutes  SIGNED:   Georgette Shell, MD  [facility] 01/24/2018, 9:07 AM   If 7PM-7AM, please contact night-coverage [email or web address] Password [email or web address]

## 2018-02-11 ENCOUNTER — Encounter: Payer: Self-pay | Admitting: Unknown Physician Specialty

## 2018-02-11 MED ORDER — GLUCOSE BLOOD VI STRP: ORAL_STRIP | 12 refills | Status: DC

## 2018-02-13 ENCOUNTER — Other Ambulatory Visit: Payer: Self-pay

## 2018-02-13 NOTE — Telephone Encounter (Signed)
Opened in error

## 2018-03-30 NOTE — Telephone Encounter (Signed)
Ok thanks for letting me know

## 2018-03-30 NOTE — Telephone Encounter (Signed)
rec'd fax from [place]: "We recently received a request for an evaluation for [employer]. We called the patent and she did not wish to undergo the evaluation at this time.  If you would still like for this evaluation to be performed, please first confirm that the patient is interested, then reorder the  Referral".  Referral ID: [id number]  [phone]

## 2018-04-06 ENCOUNTER — Ambulatory Visit (INDEPENDENT_AMBULATORY_CARE_PROVIDER_SITE_OTHER): Payer: Medicaid Other | Admitting: Unknown Physician Specialty

## 2018-04-06 ENCOUNTER — Encounter: Payer: Self-pay | Admitting: Unknown Physician Specialty

## 2018-04-06 VITALS — BP 130/87 | HR 98 | Temp 98.9°F | Ht 65.6 in | Wt 235.6 lb

## 2018-04-06 DIAGNOSIS — I10 Essential (primary) hypertension: Secondary | ICD-10-CM

## 2018-04-06 DIAGNOSIS — E1122 Type 2 diabetes mellitus with diabetic chronic kidney disease: Secondary | ICD-10-CM | POA: Diagnosis not present

## 2018-04-06 DIAGNOSIS — N183 Chronic kidney disease, stage 3 unspecified: Secondary | ICD-10-CM

## 2018-04-06 DIAGNOSIS — E1165 Type 2 diabetes mellitus with hyperglycemia: Secondary | ICD-10-CM | POA: Diagnosis not present

## 2018-04-06 DIAGNOSIS — R609 Edema, unspecified: Secondary | ICD-10-CM | POA: Diagnosis not present

## 2018-04-06 NOTE — Assessment & Plan Note (Signed)
Following with Dr. Gabriel Carina.  Notes reviewed.  Stopping Trulicity due to gastroparesis.  Continue with Lantus and Novolog

## 2018-04-06 NOTE — Assessment & Plan Note (Signed)
Stable, continue present medications.   

## 2018-04-06 NOTE — Progress Notes (Signed)
BP 130/87   Pulse 98   Temp 98.9 F (37.2 C) (Oral)   Ht 5' 5.6" (1.666 m)   Wt 235 lb 9.6 oz (106.9 kg)   SpO2 97%   BMI 38.49 kg/m    Subjective:    Patient ID: [name] [name], female    DOB: [birth year], 37 y.o.   MRN: [id number]  HPI: [name] [name] is a 37 y.o. female  Chief Complaint  Patient presents with  . Depression  . Diabetes    pt states she has not had a recent eye exam  . Hyperlipidemia  . Hypertension   Diabetes: Pt is seeing a Endocrinologist and taking Lantus sliding scale in the 60's and Novolog (about 3 units)   Using medications without difficulties No hypoglycemic episodes No hyperglycemic episodes Feet problems: none Blood Sugars averaging: eye exam within last year Last Hgb A1C:  Hypertension  Using medications without difficulty Average home BPs   Using medication without problems or lightheadedness No chest pain with exertion or shortness of breath More swelling of legs and feet lately  Elevated Cholesterol Using medications without problems No Muscle aches  Diet: Exercise: Walking with husband.  States she is not able to eat much due to gastroperesis.  She is following with GI  Relevant past medical, surgical, family and social history reviewed and updated as indicated. Interim medical history since our last visit reviewed. Allergies and medications reviewed and updated.  Review of Systems  HENT:       Recent URI  Respiratory: Negative.   Cardiovascular: Negative.   Psychiatric/Behavioral: Negative.     Per HPI unless specifically indicated above     Objective:    BP 130/87   Pulse 98   Temp 98.9 F (37.2 C) (Oral)   Ht 5' 5.6" (1.666 m)   Wt 235 lb 9.6 oz (106.9 kg)   SpO2 97%   BMI 38.49 kg/m   Wt Readings from Last 3 Encounters:  04/06/18 235 lb 9.6 oz (106.9 kg)  01/22/18 236 lb 5.3 oz (107.2 kg)  01/09/18 232 lb (105.2 kg)    Physical Exam  Constitutional: She is oriented to person, place, and time. She  appears well-developed and well-nourished. No distress.  HENT:  Head: Normocephalic and atraumatic.  Eyes: Conjunctivae and lids are normal. Right eye exhibits no discharge. Left eye exhibits no discharge. No scleral icterus.  Neck: Normal range of motion. Neck supple. No JVD present. Carotid bruit is not present.  Cardiovascular: Normal rate, regular rhythm and normal heart sounds.  Pulmonary/Chest: Effort normal and breath sounds normal.  Abdominal: Normal appearance. There is no splenomegaly or hepatomegaly.  Musculoskeletal: Normal range of motion.  Neurological: She is alert and oriented to person, place, and time.  Skin: Skin is warm, dry and intact. No rash noted. No pallor.  Psychiatric: She has a normal mood and affect. Her behavior is normal. Judgment and thought content normal.      Assessment & Plan:   Problem List Items Addressed This Visit      Unprioritized   CKD stage 3 due to type 2 diabetes mellitus ([place]) - Primary    Has not seen Nephrology.  Check again today along with CBC, Vit D, and PTH.  Consider referral.  Check Microalbumin.  Intolerant to ACE due to angioedema      Relevant Medications   insulin aspart (NOVOLOG FLEXPEN) 100 UNIT/ML FlexPen   Other Relevant Orders   Comprehensive metabolic panel   VITAMIN D 25  Hydroxy (Vit-D Deficiency, Fractures)   CBC with Differential/Platelet   PTH, intact (no Ca)   Microalbumin, Urine Waived   HTN (hypertension) (Chronic)    Stable, continue present medications.        Poorly controlled type 2 diabetes mellitus ([place])    Following with Dr. Gabriel Carina.  Notes reviewed.  Stopping Trulicity due to gastroparesis.  Continue with Lantus and Novolog      Relevant Medications   insulin aspart (NOVOLOG FLEXPEN) 100 UNIT/ML FlexPen    Other Visit Diagnoses    Swelling       More swelling lately.  Not noted today and lungs clear.  will refer to Dr. Fletcher Anon and [name] to assess if changes in heart failure tx needed.           Follow up plan: Return in about 6 months (around 10/06/2018).

## 2018-04-06 NOTE — Assessment & Plan Note (Addendum)
Has not seen Nephrology.  Check again today along with CBC, Vit D, and PTH.  Consider referral.  Check Microalbumin.  Intolerant to ACE due to angioedema

## 2018-04-07 ENCOUNTER — Other Ambulatory Visit: Payer: Self-pay | Admitting: Unknown Physician Specialty

## 2018-04-07 LAB — CBC WITH DIFFERENTIAL/PLATELET
Basophils Absolute: 0 10*3/uL (ref 0.0–0.2)
Basos: 1 %
EOS (ABSOLUTE): 0.3 10*3/uL (ref 0.0–0.4)
Eos: 5 %
Hematocrit: 33.6 % — ABNORMAL LOW (ref 34.0–46.6)
Hemoglobin: 10.4 g/dL — ABNORMAL LOW (ref 11.1–15.9)
Immature Grans (Abs): 0 10*3/uL (ref 0.0–0.1)
Immature Granulocytes: 0 %
Lymphocytes Absolute: 2.1 10*3/uL (ref 0.7–3.1)
Lymphs: 32 %
MCH: 25.3 pg — ABNORMAL LOW (ref 26.6–33.0)
MCHC: 31 g/dL — ABNORMAL LOW (ref 31.5–35.7)
MCV: 82 fL (ref 79–97)
Monocytes Absolute: 0.3 10*3/uL (ref 0.1–0.9)
Monocytes: 5 %
Neutrophils Absolute: 3.8 10*3/uL (ref 1.4–7.0)
Neutrophils: 57 %
Platelets: 432 10*3/uL — ABNORMAL HIGH (ref 150–379)
RBC: 4.11 x10E6/uL (ref 3.77–5.28)
RDW: 15.4 % (ref 12.3–15.4)
WBC: 6.6 10*3/uL (ref 3.4–10.8)

## 2018-04-07 LAB — COMPREHENSIVE METABOLIC PANEL
ALT: 13 IU/L (ref 0–32)
AST: 15 IU/L (ref 0–40)
Albumin/Globulin Ratio: 1.1 — ABNORMAL LOW (ref 1.2–2.2)
Albumin: 3.3 g/dL — ABNORMAL LOW (ref 3.5–5.5)
Alkaline Phosphatase: 106 IU/L (ref 39–117)
BUN/Creatinine Ratio: 18 (ref 9–23)
BUN: 22 mg/dL — ABNORMAL HIGH (ref 6–20)
Bilirubin Total: <0.2 mg/dL (ref 0.0–1.2)
CO2: 21 mmol/L (ref 20–29)
Calcium: 8.7 mg/dL (ref 8.7–10.2)
Chloride: 107 mmol/L — ABNORMAL HIGH (ref 96–106)
Creatinine, Ser: 1.23 mg/dL — ABNORMAL HIGH (ref 0.57–1.00)
GFR calc Af Amer: 64 mL/min/{1.73_m2} (ref >59)
GFR calc non Af Amer: 56 mL/min/{1.73_m2} — ABNORMAL LOW (ref >59)
Globulin, Total: 2.9 g/dL (ref 1.5–4.5)
Glucose: 163 mg/dL — ABNORMAL HIGH (ref 65–99)
Potassium: 4.6 mmol/L (ref 3.5–5.2)
Sodium: 141 mmol/L (ref 134–144)
Total Protein: 6.2 g/dL (ref 6.0–8.5)

## 2018-04-07 LAB — PARATHYROID HORMONE, INTACT (NO CA): PTH: 70 pg/mL — ABNORMAL HIGH (ref 15–65)

## 2018-04-07 LAB — VITAMIN D 25 HYDROXY (VIT D DEFICIENCY, FRACTURES): Vit D, 25-Hydroxy: 12.8 ng/mL — ABNORMAL LOW (ref 30.0–100.0)

## 2018-04-07 MED ORDER — VITAMIN D (ERGOCALCIFEROL) 1.25 MG (50000 UNIT) PO CAPS: 50000.0000 [IU] | ORAL_CAPSULE | ORAL | 0 refills | Status: DC

## 2018-04-16 ENCOUNTER — Encounter: Payer: Self-pay | Admitting: Unknown Physician Specialty

## 2018-04-16 MED ORDER — PROMETHAZINE HCL 25 MG PO TABS: 25.0000 mg | ORAL_TABLET | Freq: Three times a day (TID) | ORAL | 2 refills | Status: DC | PRN

## 2018-04-29 ENCOUNTER — Emergency Department: Payer: Medicaid Other

## 2018-04-29 ENCOUNTER — Other Ambulatory Visit: Payer: Self-pay

## 2018-04-29 ENCOUNTER — Emergency Department
Admission: EM | Admit: 2018-04-29 | Discharge: 2018-04-30 | Disposition: A | Discharge status: Home / Self Care | Payer: Medicaid Other | Attending: Student in an Organized Health Care Education/Training Program | Admitting: Student in an Organized Health Care Education/Training Program

## 2018-04-29 DIAGNOSIS — Z79899 Other long term (current) drug therapy: Secondary | ICD-10-CM | POA: Diagnosis not present

## 2018-04-29 DIAGNOSIS — K5289 Other specified noninfective gastroenteritis and colitis: Secondary | ICD-10-CM | POA: Insufficient documentation

## 2018-04-29 DIAGNOSIS — Z794 Long term (current) use of insulin: Secondary | ICD-10-CM | POA: Insufficient documentation

## 2018-04-29 DIAGNOSIS — Z975 Presence of (intrauterine) contraceptive device: Secondary | ICD-10-CM | POA: Diagnosis not present

## 2018-04-29 DIAGNOSIS — N183 Chronic kidney disease, stage 3 (moderate): Secondary | ICD-10-CM | POA: Insufficient documentation

## 2018-04-29 DIAGNOSIS — R112 Nausea with vomiting, unspecified: Secondary | ICD-10-CM

## 2018-04-29 DIAGNOSIS — Z87891 Personal history of nicotine dependence: Secondary | ICD-10-CM | POA: Insufficient documentation

## 2018-04-29 DIAGNOSIS — I129 Hypertensive chronic kidney disease with stage 1 through stage 4 chronic kidney disease, or unspecified chronic kidney disease: Secondary | ICD-10-CM | POA: Insufficient documentation

## 2018-04-29 DIAGNOSIS — Z7982 Long term (current) use of aspirin: Secondary | ICD-10-CM | POA: Insufficient documentation

## 2018-04-29 DIAGNOSIS — E119 Type 2 diabetes mellitus without complications: Secondary | ICD-10-CM | POA: Diagnosis not present

## 2018-04-29 DIAGNOSIS — K529 Noninfective gastroenteritis and colitis, unspecified: Secondary | ICD-10-CM

## 2018-04-29 DIAGNOSIS — R197 Diarrhea, unspecified: Secondary | ICD-10-CM

## 2018-04-29 LAB — CBC
HCT: 35.3 % (ref 35.0–47.0)
Hemoglobin: 11.9 g/dL — ABNORMAL LOW (ref 12.0–16.0)
MCH: 26.5 pg (ref 26.0–34.0)
MCHC: 33.5 g/dL (ref 32.0–36.0)
MCV: 79 fL — ABNORMAL LOW (ref 80.0–100.0)
Platelets: 432 10*3/uL (ref 150–440)
RBC: 4.47 MIL/uL (ref 3.80–5.20)
RDW: 15.4 % — ABNORMAL HIGH (ref 11.5–14.5)
WBC: 8.9 10*3/uL (ref 3.6–11.0)

## 2018-04-29 LAB — COMPREHENSIVE METABOLIC PANEL
ALT: 14 U/L (ref 14–54)
AST: 17 U/L (ref 15–41)
Albumin: 3.2 g/dL — ABNORMAL LOW (ref 3.5–5.0)
Alkaline Phosphatase: 114 U/L (ref 38–126)
Anion gap: 10 (ref 5–15)
BUN: 33 mg/dL — ABNORMAL HIGH (ref 6–20)
CO2: 19 mmol/L — ABNORMAL LOW (ref 22–32)
Calcium: 9.2 mg/dL (ref 8.9–10.3)
Chloride: 111 mmol/L (ref 101–111)
Creatinine, Ser: 1.57 mg/dL — ABNORMAL HIGH (ref 0.44–1.00)
GFR calc Af Amer: 47 mL/min — ABNORMAL LOW (ref >60)
GFR calc non Af Amer: 41 mL/min — ABNORMAL LOW (ref >60)
Glucose, Bld: 181 mg/dL — ABNORMAL HIGH (ref 65–99)
Potassium: 4.5 mmol/L (ref 3.5–5.1)
Sodium: 140 mmol/L (ref 135–145)
Total Bilirubin: 0.4 mg/dL (ref 0.3–1.2)
Total Protein: 7.9 g/dL (ref 6.5–8.1)

## 2018-04-29 LAB — HCG, QUANTITATIVE, PREGNANCY: hCG, Beta Chain, Quant, S: 1 m[IU]/mL (ref <5)

## 2018-04-29 LAB — LIPASE, BLOOD: Lipase: 19 U/L (ref 11–51)

## 2018-04-29 MED ORDER — MORPHINE SULFATE (PF) 4 MG/ML IV SOLN
4.0000 mg | INTRAVENOUS | Status: DC | PRN
  Administered 2018-04-29: 4 mg via INTRAVENOUS
  Filled 2018-04-29: qty 1

## 2018-04-29 MED ORDER — PROMETHAZINE HCL 25 MG/ML IJ SOLN: 12.5000 mg | Freq: Four times a day (QID) | INTRAMUSCULAR | Status: DC | PRN

## 2018-04-29 MED ORDER — METOCLOPRAMIDE HCL 5 MG/ML IJ SOLN
10.0000 mg | Freq: Once | INTRAMUSCULAR | Status: AC
  Administered 2018-04-29: 10 mg via INTRAVENOUS
  Filled 2018-04-29: qty 2

## 2018-04-29 MED ORDER — IOPAMIDOL (ISOVUE-300) INJECTION 61%
100.0000 mL | Freq: Once | INTRAVENOUS | Status: AC | PRN
  Administered 2018-04-29: 100 mL via INTRAVENOUS

## 2018-04-29 MED ORDER — SODIUM CHLORIDE 0.9 % IV BOLUS
1000.0000 mL | Freq: Once | INTRAVENOUS | Status: AC
  Administered 2018-04-29: 1000 mL via INTRAVENOUS

## 2018-04-29 MED ORDER — IOPAMIDOL (ISOVUE-370) INJECTION 76%: 75.0000 mL | Freq: Once | INTRAVENOUS | Status: DC | PRN

## 2018-04-29 NOTE — ED Triage Notes (Signed)
Pt c/o upper abd pain with N/V/D for the past 2 days. States she has a hx of gastroparesis but this feels different. Pt states she has taken phenergan with no relief.Marland Kitchen

## 2018-04-29 NOTE — ED Provider Notes (Addendum)
[facility] Emergency Department Provider Note    First MD Initiated Contact with Patient 04/29/18 2054     (approximate)  I have reviewed the triage vital signs and the nursing notes.   HISTORY  Chief Complaint Abdominal Pain    HPI [name] [name] is a 37 y.o. female history of diabetes gastroparesis presents to the ER with nausea vomiting diarrhea for the past 2 days it became acutely worse today and is complaining of epigastric pain.  States that she took Phenergan without any improvement.  Should fevers.  No dysuria.  No blood in her stools.  States the pain is crampy and mild to moderate.  Past Medical History:  Diagnosis Date  . Chronic systolic CHF (congestive heart failure) ([place])    a. echo 03/2015: EF 30-35%, mild concentric LVH, severe HK of inf, inflat, & lat walls, mod MR, mild TR;  b. 04/2015 LV Gram: EF 55-65%.  . Diabetes type 2, controlled ([place])    a. since 2002   . Diverticulosis, sigmoid 07/2016  . Heavy menses    a. H/O IUD - expired in 2014 - remains in place.  . Hyperlipidemia   . Hypertension   . Iron deficiency anemia   . Ischemic cardiomyopathy    a. 03/2015 EF 30-35% post NSTEMI;  b. 04/2015 EF 55-65% on LV gram.  . Kidney disease   . MI (myocardial infarction) ([place]) 03/29/2015  . Obesity   . Renal insufficiency   . Tobacco abuse    a. quit 03/2015.  Marland Kitchen Uterine fibroid   . Vertigo   . Vitamin D deficiency    Family History  Problem Relation Age of Onset  . Diabetes Father   . Cancer Maternal Grandmother        lung  . Cancer Maternal Grandfather        prostate  . Diabetes Paternal Grandfather   . Diabetes Paternal Grandmother   . Cancer - Cervical Maternal Aunt    Past Surgical History:  Procedure Laterality Date  . APPENDECTOMY    . CARDIAC CATHETERIZATION  4/16   x2 stent [facility]  . CARDIAC CATHETERIZATION N/A 05/05/2015   Procedure: Left Heart Cath and Coronary Angiography;  Surgeon: Peter M Martinique, MD;   Location: [place] [facility];  Service: Cardiovascular;  Laterality: N/A;  . COLONOSCOPY WITH PROPOFOL N/A 07/31/2016   Procedure: COLONOSCOPY WITH PROPOFOL;  Surgeon: Lollie Sails, MD;  Location: [facility] ENDOSCOPY;  Service: Endoscopy;  Laterality: N/A;  . ESOPHAGOGASTRODUODENOSCOPY (EGD) WITH PROPOFOL N/A 07/31/2016   Procedure: ESOPHAGOGASTRODUODENOSCOPY (EGD) WITH PROPOFOL;  Surgeon: Lollie Sails, MD;  Location: [facility] ENDOSCOPY;  Service: Endoscopy;  Laterality: N/A;  . LAPAROSCOPIC APPENDECTOMY N/A 08/07/2016   Procedure: APPENDECTOMY LAPAROSCOPIC;  Surgeon: Hubbard , MD;  Location: [facility] ORS;  Service: General;  Laterality: N/A;  . MOUTH SURGERY     Patient Active Problem List   Diagnosis Date Noted  . CKD stage 3 due to type 2 diabetes mellitus ([place]) 04/06/2018  . Acute lower UTI 01/22/2018  . Gastroparesis 10/27/2017  . Poorly controlled type 2 diabetes mellitus ([place]) 08/27/2017  . Lung nodule < 6cm on CT 08/12/2017  . Depression, major, single episode, severe ([place]) 08/12/2017  . Chronic abdominal pain 08/03/2017  . Obstructive sleep apnea 05/16/2017  . Neuropathy 01/17/2017  . Edema of both ankles 01/17/2017  . Fatigue 12/16/2016  . Myalgic 10/25/2016  . Vitamin D deficiency 10/25/2016  . Insomnia 10/25/2016  . Urinary tract infection  09/20/2016  . HTN (hypertension) 09/05/2016  . Vertigo 06/21/2016  . Elevated liver function tests 06/05/2016  . Chronic diastolic heart failure ([place]) 03/19/2016  . Tachycardia 03/19/2016  . IBS (irritable bowel syndrome) 12/15/2015  . Proteinuria 08/30/2015  . Coronary artery disease involving native coronary artery of native heart with angina pectoris with documented spasm ([place])   . Angina pectoris ([place])   . Iron deficiency anemia   . Heavy menses   . SOB (shortness of breath) 08/21/2015  . Hyperlipidemia 04/13/2015  . Ischemic cardiomyopathy   . Obesity       Prior to Admission medications   Medication Sig Start Date  End Date Taking? Authorizing Provider  albuterol (PROVENTIL HFA;VENTOLIN HFA) 108 (90 Base) MCG/ACT inhaler Inhale 2 puffs into the lungs every 6 (six) hours as needed for wheezing or shortness of breath. 02/19/17   Volney American, PA-C  amitriptyline (ELAVIL) 10 MG tablet Take 1-2 tablets (10-20 mg total) by mouth at bedtime. Take 2 tablets at bedtime. Total of 20mg  nightly Patient taking differently: Take 30 mg at bedtime by mouth.  05/15/17   Armbruster, Carlota Raspberry, MD  aspirin EC 81 MG tablet Take 81 mg by mouth daily.    [provider]  benzonatate (TESSALON) 100 MG capsule Take 1 capsule (100 mg total) by mouth 2 (two) times daily as needed for cough. 01/07/18   Kathrine Haddock, NP  Cholecalciferol 2000 units TABS Take 1 tablet by mouth daily.    [provider]  dicyclomine (BENTYL) 20 MG tablet Take 1 tablet (20 mg total) by mouth every 6 (six) hours. 01/09/18   Kathrine Haddock, NP  etonogestrel (NEXPLANON) 68 MG IMPL implant 1 each by Subdermal route once.    [provider]  ferrous sulfate 325 (65 FE) MG tablet TAKE ONE TABLET BY MOUTH THREE TIMES DAILY WITH MEALS Patient taking differently: take one tablet by mouth once daily 03/03/17   Kathrine Haddock, NP  glimepiride (AMARYL) 4 MG tablet Take 1 tablet (4 mg total) by mouth daily. 12/17/17   Kathrine Haddock, NP  glucose blood [employer] ACTIVE STRIPS) test strip Use as instructed 02/11/18   Volney American, PA-C  glucose blood (ACCU-CHEK AVIVA) test strip 1 each by Other route as needed for other. Use as instructed    [provider]  insulin aspart (NOVOLOG FLEXPEN) 100 UNIT/ML FlexPen TAKE UP TO 50 UNITS DAILY IN DIVIDED DOSES AS DIRECTED 02/24/18   [provider]  Insulin Glargine (LANTUS SOLOSTAR) 100 UNIT/ML Solostar Pen Inject 60 Units into the skin daily at 10 pm. 10/02/17   Kathrine Haddock, NP  Insulin Pen Needle 32G X 4 MM MISC 1 Units by Does not apply route every morning. Pen needles  08/27/17   Kathrine Haddock, NP  isosorbide mononitrate (IMDUR) 30 MG 24 hr tablet TAKE 1 TABLET BY MOUTH ONCE DAILY 10/06/17   Wellington Hampshire, MD  loperamide (IMODIUM) 2 MG capsule Take 2 mg by mouth as needed for diarrhea or loose stools.    [provider]  meclizine (ANTIVERT) 32 MG tablet Take 1 tablet (32 mg total) by mouth 3 (three) times daily as needed. Patient taking differently: Take 32 mg by mouth 3 (three) times daily as needed for dizziness or nausea.  06/21/16   Kathrine Haddock, NP  metoCLOPramide (REGLAN) 10 MG tablet Take 1 tablet (10 mg total) by mouth 3 (three) times daily before meals. 06/02/17   Armbruster, Carlota Raspberry, MD  metoprolol tartrate (LOPRESSOR)  50 MG tablet Take 1 tablet (50 mg total) by mouth 2 (two) times daily. Need office visit 01/07/18   Alisa Graff, FNP  mupirocin ointment (BACTROBAN) 2 % Place 1 application into the nose 2 (two) times daily. 01/24/18   Georgette Shell, MD  nitroGLYCERIN (NITROSTAT) 0.4 MG SL tablet Place 1 tablet (0.4 mg total) under the tongue every 5 (five) minutes as needed for chest pain. Reported on 02/20/2016 03/17/17   Alisa Graff, FNP  pantoprazole (PROTONIX) 40 MG tablet TAKE ONE TABLET BY MOUTH ONCE DAILY AT 6 AM. 12/17/17   Kathrine Haddock, NP  prochlorperazine (COMPAZINE) 10 MG tablet Take 1 tablet (10 mg total) by mouth every 6 (six) hours as needed for nausea or vomiting. 04/30/18   Merlyn Lot, MD  promethazine (PHENERGAN) 25 MG tablet Take 1 tablet (25 mg total) by mouth every 8 (eight) hours as needed for nausea or vomiting. 04/16/18   Wynetta Emery, Megan P, DO  rosuvastatin (CRESTOR) 5 MG tablet TAKE ONE TABLET BY MOUTH ONCE DAILY 06/02/17   Wellington Hampshire, MD  sucralfate (CARAFATE) 1 g tablet Take 1 tablet (1 g total) by mouth 2 (two) times daily. 04/30/18 04/30/19  Merlyn Lot, MD  venlafaxine XR (EFFEXOR-XR) 150 MG 24 hr capsule Take 150 mg by mouth daily with breakfast.     [provider]  Vitamin D,  Ergocalciferol, (DRISDOL) 50000 units CAPS capsule Take 1 capsule (50,000 Units total) by mouth every 7 (seven) days. 04/07/18   Kathrine Haddock, NP    Allergies Lisinopril; Rosemary oil; Shellfish allergy; and Metformin and related    Social History Social History   Tobacco Use  . Smoking status: Former Smoker    Years: 15.00    Last attempt to quit: 03/10/2015    Years since quitting: 3.1  . Smokeless tobacco: Never Used  Substance Use Topics  . Alcohol use: No    Alcohol/week: 0.0 oz  . Drug use: No    Review of Systems Patient denies headaches, rhinorrhea, blurry vision, numbness, shortness of breath, chest pain, edema, cough, abdominal pain, nausea, vomiting, diarrhea, dysuria, fevers, rashes or hallucinations unless otherwise stated above in HPI. ____________________________________________   PHYSICAL EXAM:  VITAL SIGNS: Vitals:   04/29/18 2230 04/29/18 2300  BP: 133/76 (!) 154/89  Pulse: (!) 110 (!) 109  Resp:    Temp:    SpO2: 98% 98%    Constitutional: Alert and oriented.  Eyes: Conjunctivae are normal.  Head: Atraumatic. Nose: No congestion/rhinnorhea. Mouth/Throat: Mucous membranes are moist.   Neck: No stridor. Painless ROM.  Cardiovascular: Normal rate, regular rhythm. Grossly normal heart sounds.  Good peripheral circulation. Respiratory: Normal respiratory effort.  No retractions. Lungs CTAB. Gastrointestinal: Soft with mild epigastric ttp. No distention. No abdominal bruits. No CVA tenderness. Genitourinary:  Musculoskeletal: No lower extremity tenderness nor edema.  No joint effusions. Neurologic:  Normal speech and language. No gross focal neurologic deficits are appreciated. No facial droop Skin:  Skin is warm, dry and intact. No rash noted. Psychiatric: Mood and affect are normal. Speech and behavior are normal.  ____________________________________________   LABS (all labs ordered are listed, but only abnormal results are  displayed)  Results for orders placed or performed during the hospital encounter of 04/29/18 (from the past 24 hour(s))  Lipase, blood     Status: None   Collection Time: 04/29/18  6:50 PM  Result Value Ref Range   Lipase 19 11 - 51 U/L  Comprehensive metabolic panel  Status: Abnormal   Collection Time: 04/29/18  6:50 PM  Result Value Ref Range   Sodium 140 135 - 145 mmol/L   Potassium 4.5 3.5 - 5.1 mmol/L   Chloride 111 101 - 111 mmol/L   CO2 19 (L) 22 - 32 mmol/L   Glucose, Bld 181 (H) 65 - 99 mg/dL   BUN 33 (H) 6 - 20 mg/dL   Creatinine, Ser 1.57 (H) 0.44 - 1.00 mg/dL   Calcium 9.2 8.9 - 10.3 mg/dL   Total Protein 7.9 6.5 - 8.1 g/dL   Albumin 3.2 (L) 3.5 - 5.0 g/dL   AST 17 15 - 41 U/L   ALT 14 14 - 54 U/L   Alkaline Phosphatase 114 38 - 126 U/L   Total Bilirubin 0.4 0.3 - 1.2 mg/dL   GFR calc non Af Amer 41 (L) >60 mL/min   GFR calc Af Amer 47 (L) >60 mL/min   Anion gap 10 5 - 15  CBC     Status: Abnormal   Collection Time: 04/29/18  6:50 PM  Result Value Ref Range   WBC 8.9 3.6 - 11.0 K/uL   RBC 4.47 3.80 - 5.20 MIL/uL   Hemoglobin 11.9 (L) 12.0 - 16.0 g/dL   HCT 35.3 35.0 - 47.0 %   MCV 79.0 (L) 80.0 - 100.0 fL   MCH 26.5 26.0 - 34.0 pg   MCHC 33.5 32.0 - 36.0 g/dL   RDW 15.4 (H) 11.5 - 14.5 %   Platelets 432 150 - 440 K/uL  hCG, quantitative, pregnancy     Status: None   Collection Time: 04/29/18  6:50 PM  Result Value Ref Range   hCG, Beta Chain, Quant, S 1 <5 mIU/mL   ____________________________________________ ____________________________________________  RADIOLOGY  I personally reviewed all radiographic images ordered to evaluate for the above acute complaints and reviewed radiology reports and findings.  These findings were personally discussed with the patient.  Please see medical record for radiology report.  ____________________________________________   PROCEDURES  Procedure(s) performed:  Procedures    Critical Care performed:  no ____________________________________________   INITIAL IMPRESSION / ASSESSMENT AND PLAN / ED COURSE  Pertinent labs & imaging results that were available during my care of the patient were reviewed by me and considered in my medical decision making (see chart for details).  DDX: enteritis, gastroenteritis, acs, sbo, cholecystitis  Nalda Shackleford is a 37 y.o. who presents to the ED with symptoms as described above.  CT imaging will be ordered as the patient is morbidly obese with extensive past medical history to evaluate for acute intra-abdominal process.  Denies any chest pain or shortness of breath at this time.  Will provide IV fluids and IV antiemetics as well as IV pain medication.  Patient will be signed out to oncoming physician pending follow-up CT.  Clinical Course as of Apr 30 10  Thu Apr 30, 2018  0009 Patient was just reassessed.  States that her pain and nausea has resolved.  States that she does typically take metoprolol at night which explain the patient's tachycardia.  Will order p.o. challenge   [PR]  0009  as well as GI cocktail and patient is tolerating p.o. I do anticipate patient will be stable and appropriate for discharge home.   [PR]    Clinical Course User Index [PR] Merlyn Lot, MD     As part of my medical decision making, I reviewed the following data within the [place] notes reviewed and  incorporated, Labs reviewed, notes from prior ED visits.   ____________________________________________   FINAL CLINICAL IMPRESSION(S) / ED DIAGNOSES  Final diagnoses:  Nausea vomiting and diarrhea      NEW MEDICATIONS STARTED DURING THIS VISIT:  New Prescriptions   PROCHLORPERAZINE (COMPAZINE) 10 MG TABLET    Take 1 tablet (10 mg total) by mouth every 6 (six) hours as needed for nausea or vomiting.   SUCRALFATE (CARAFATE) 1 G TABLET    Take 1 tablet (1 g total) by mouth 2 (two) times daily.     Note:  This document was  prepared using Dragon voice recognition software and may include unintentional dictation errors.    Merlyn Lot, MD 04/30/18 0001    Merlyn Lot, MD 04/30/18 [phone]

## 2018-04-30 ENCOUNTER — Telehealth: Payer: Self-pay | Admitting: Cardiovascular Disease

## 2018-04-30 MED ORDER — METOPROLOL TARTRATE 50 MG PO TABS
50.0000 mg | ORAL_TABLET | Freq: Once | ORAL | Status: AC
  Administered 2018-04-30: 50 mg via ORAL
  Filled 2018-04-30: qty 1

## 2018-04-30 MED ORDER — SUCRALFATE 1 G PO TABS: 1.0000 g | ORAL_TABLET | Freq: Two times a day (BID) | ORAL | 0 refills | Status: DC

## 2018-04-30 MED ORDER — GI COCKTAIL ~~LOC~~
30.0000 mL | Freq: Once | ORAL | Status: AC
Start: 2018-04-30 — End: 2018-04-30
  Administered 2018-04-30: 30 mL via ORAL
  Filled 2018-04-30: qty 30

## 2018-04-30 MED ORDER — PROCHLORPERAZINE MALEATE 10 MG PO TABS: 10.0000 mg | ORAL_TABLET | Freq: Four times a day (QID) | ORAL | 0 refills | Status: DC | PRN

## 2018-04-30 NOTE — Discharge Instructions (Addendum)

## 2018-04-30 NOTE — Telephone Encounter (Signed)
Received records request [facility], forwarded to [facility] for processing.

## 2018-05-01 ENCOUNTER — Other Ambulatory Visit: Payer: Self-pay

## 2018-05-01 DIAGNOSIS — K3184 Gastroparesis: Secondary | ICD-10-CM | POA: Diagnosis present

## 2018-05-01 DIAGNOSIS — Z91018 Allergy to other foods: Secondary | ICD-10-CM

## 2018-05-01 DIAGNOSIS — I130 Hypertensive heart and chronic kidney disease with heart failure and stage 1 through stage 4 chronic kidney disease, or unspecified chronic kidney disease: Secondary | ICD-10-CM | POA: Diagnosis present

## 2018-05-01 DIAGNOSIS — K5730 Diverticulosis of large intestine without perforation or abscess without bleeding: Secondary | ICD-10-CM | POA: Diagnosis present

## 2018-05-01 DIAGNOSIS — D259 Leiomyoma of uterus, unspecified: Secondary | ICD-10-CM | POA: Diagnosis present

## 2018-05-01 DIAGNOSIS — Z6839 Body mass index (BMI) 39.0-39.9, adult: Secondary | ICD-10-CM

## 2018-05-01 DIAGNOSIS — Z888 Allergy status to other drugs, medicaments and biological substances status: Secondary | ICD-10-CM

## 2018-05-01 DIAGNOSIS — N179 Acute kidney failure, unspecified: Secondary | ICD-10-CM | POA: Diagnosis present

## 2018-05-01 DIAGNOSIS — E559 Vitamin D deficiency, unspecified: Secondary | ICD-10-CM | POA: Diagnosis present

## 2018-05-01 DIAGNOSIS — T730XXA Starvation, initial encounter: Secondary | ICD-10-CM | POA: Diagnosis present

## 2018-05-01 DIAGNOSIS — I5022 Chronic systolic (congestive) heart failure: Secondary | ICD-10-CM | POA: Diagnosis present

## 2018-05-01 DIAGNOSIS — E785 Hyperlipidemia, unspecified: Secondary | ICD-10-CM | POA: Diagnosis present

## 2018-05-01 DIAGNOSIS — I2510 Atherosclerotic heart disease of native coronary artery without angina pectoris: Secondary | ICD-10-CM | POA: Diagnosis present

## 2018-05-01 DIAGNOSIS — Z809 Family history of malignant neoplasm, unspecified: Secondary | ICD-10-CM

## 2018-05-01 DIAGNOSIS — D509 Iron deficiency anemia, unspecified: Secondary | ICD-10-CM | POA: Diagnosis present

## 2018-05-01 DIAGNOSIS — Z7982 Long term (current) use of aspirin: Secondary | ICD-10-CM

## 2018-05-01 DIAGNOSIS — E1122 Type 2 diabetes mellitus with diabetic chronic kidney disease: Secondary | ICD-10-CM | POA: Diagnosis present

## 2018-05-01 DIAGNOSIS — Z808 Family history of malignant neoplasm of other organs or systems: Secondary | ICD-10-CM

## 2018-05-01 DIAGNOSIS — D631 Anemia in chronic kidney disease: Secondary | ICD-10-CM | POA: Diagnosis present

## 2018-05-01 DIAGNOSIS — I252 Old myocardial infarction: Secondary | ICD-10-CM

## 2018-05-01 DIAGNOSIS — Z833 Family history of diabetes mellitus: Secondary | ICD-10-CM

## 2018-05-01 DIAGNOSIS — Z91013 Allergy to seafood: Secondary | ICD-10-CM

## 2018-05-01 DIAGNOSIS — F329 Major depressive disorder, single episode, unspecified: Secondary | ICD-10-CM | POA: Diagnosis present

## 2018-05-01 DIAGNOSIS — Z794 Long term (current) use of insulin: Secondary | ICD-10-CM

## 2018-05-01 DIAGNOSIS — N183 Chronic kidney disease, stage 3 (moderate): Secondary | ICD-10-CM | POA: Diagnosis present

## 2018-05-01 DIAGNOSIS — E860 Dehydration: Secondary | ICD-10-CM | POA: Diagnosis present

## 2018-05-01 DIAGNOSIS — Z87891 Personal history of nicotine dependence: Secondary | ICD-10-CM

## 2018-05-01 DIAGNOSIS — I255 Ischemic cardiomyopathy: Secondary | ICD-10-CM | POA: Diagnosis present

## 2018-05-01 DIAGNOSIS — E1143 Type 2 diabetes mellitus with diabetic autonomic (poly)neuropathy: Principal | ICD-10-CM | POA: Diagnosis present

## 2018-05-01 LAB — CBC
HCT: 35.2 % (ref 35.0–47.0)
Hemoglobin: 11.7 g/dL — ABNORMAL LOW (ref 12.0–16.0)
MCH: 26.1 pg (ref 26.0–34.0)
MCHC: 33.3 g/dL (ref 32.0–36.0)
MCV: 78.6 fL — ABNORMAL LOW (ref 80.0–100.0)
Platelets: 440 10*3/uL (ref 150–440)
RBC: 4.48 MIL/uL (ref 3.80–5.20)
RDW: 15.4 % — ABNORMAL HIGH (ref 11.5–14.5)
WBC: 10 10*3/uL (ref 3.6–11.0)

## 2018-05-01 LAB — COMPREHENSIVE METABOLIC PANEL
ALT: 13 U/L — ABNORMAL LOW (ref 14–54)
AST: 16 U/L (ref 15–41)
Albumin: 3.4 g/dL — ABNORMAL LOW (ref 3.5–5.0)
Alkaline Phosphatase: 112 U/L (ref 38–126)
Anion gap: 12 (ref 5–15)
BUN: 31 mg/dL — ABNORMAL HIGH (ref 6–20)
CO2: 18 mmol/L — ABNORMAL LOW (ref 22–32)
Calcium: 9.4 mg/dL (ref 8.9–10.3)
Chloride: 110 mmol/L (ref 101–111)
Creatinine, Ser: 1.53 mg/dL — ABNORMAL HIGH (ref 0.44–1.00)
GFR calc Af Amer: 49 mL/min — ABNORMAL LOW (ref >60)
GFR calc non Af Amer: 42 mL/min — ABNORMAL LOW (ref >60)
Glucose, Bld: 187 mg/dL — ABNORMAL HIGH (ref 65–99)
Potassium: 3.8 mmol/L (ref 3.5–5.1)
Sodium: 140 mmol/L (ref 135–145)
Total Bilirubin: 0.6 mg/dL (ref 0.3–1.2)
Total Protein: 7.9 g/dL (ref 6.5–8.1)

## 2018-05-01 LAB — LIPASE, BLOOD: Lipase: 17 U/L (ref 11–51)

## 2018-05-01 MED ORDER — ONDANSETRON 4 MG PO TBDP
4.0000 mg | ORAL_TABLET | Freq: Once | ORAL | Status: AC
  Administered 2018-05-01: 4 mg via ORAL
  Filled 2018-05-01: qty 1

## 2018-05-01 NOTE — ED Triage Notes (Signed)
Patient reports having abdominal pain and upper chest tightness.  Reports never felt better from when her couple days ago.

## 2018-05-01 NOTE — ED Notes (Signed)
Pt unable to give urine sample at this time 

## 2018-05-02 ENCOUNTER — Encounter: Payer: Self-pay | Admitting: Internal Medicine

## 2018-05-02 ENCOUNTER — Inpatient Hospital Stay
Admission: EM | Admit: 2018-05-02 | Discharge: 2018-05-04 | DRG: 074 | Disposition: A | Discharge status: Home / Self Care | Payer: Medicaid Other | Attending: Internal Medicine | Admitting: Internal Medicine

## 2018-05-02 DIAGNOSIS — E1143 Type 2 diabetes mellitus with diabetic autonomic (poly)neuropathy: Secondary | ICD-10-CM | POA: Diagnosis present

## 2018-05-02 DIAGNOSIS — T730XXD Starvation, subsequent encounter: Secondary | ICD-10-CM

## 2018-05-02 DIAGNOSIS — K3184 Gastroparesis: Secondary | ICD-10-CM | POA: Diagnosis present

## 2018-05-02 DIAGNOSIS — E860 Dehydration: Secondary | ICD-10-CM

## 2018-05-02 LAB — GLUCOSE, CAPILLARY
Glucose-Capillary: 156 mg/dL — ABNORMAL HIGH (ref 65–99)
Glucose-Capillary: 107 mg/dL — ABNORMAL HIGH (ref 65–99)
Glucose-Capillary: 136 mg/dL — ABNORMAL HIGH (ref 65–99)
Glucose-Capillary: 125 mg/dL — ABNORMAL HIGH (ref 65–99)

## 2018-05-02 LAB — HCG, QUANTITATIVE, PREGNANCY: hCG, Beta Chain, Quant, S: <1 m[IU]/mL (ref <5)

## 2018-05-02 LAB — PHOSPHORUS: Phosphorus: 4.6 mg/dL (ref 2.5–4.6)

## 2018-05-02 LAB — MAGNESIUM: Magnesium: 1.9 mg/dL (ref 1.7–2.4)

## 2018-05-02 LAB — BETA-HYDROXYBUTYRIC ACID: Beta-Hydroxybutyric Acid: 2.54 mmol/L — ABNORMAL HIGH (ref 0.05–0.27)

## 2018-05-02 MED ORDER — SODIUM CHLORIDE 0.9 % IV BOLUS
1000.0000 mL | Freq: Once | INTRAVENOUS | Status: AC
  Administered 2018-05-02: 1000 mL via INTRAVENOUS

## 2018-05-02 MED ORDER — ERYTHROMYCIN ETHYLSUCCINATE 200 MG/5ML PO SUSR: 200.0000 mg | Freq: Three times a day (TID) | ORAL | Status: DC

## 2018-05-02 MED ORDER — INSULIN GLARGINE 100 UNIT/ML ~~LOC~~ SOLN
40.0000 [IU] | Freq: Every day | SUBCUTANEOUS | Status: DC
  Filled 2018-05-02 (×3): qty 0.4

## 2018-05-02 MED ORDER — ACETAMINOPHEN 650 MG RE SUPP: 650.0000 mg | Freq: Four times a day (QID) | RECTAL | Status: DC | PRN

## 2018-05-02 MED ORDER — SENNOSIDES-DOCUSATE SODIUM 8.6-50 MG PO TABS: 1.0000 | ORAL_TABLET | Freq: Every evening | ORAL | Status: DC | PRN

## 2018-05-02 MED ORDER — ENOXAPARIN SODIUM 40 MG/0.4ML ~~LOC~~ SOLN
40.0000 mg | SUBCUTANEOUS | Status: DC
  Administered 2018-05-02 – 2018-05-04 (×3): 40 mg via SUBCUTANEOUS
  Filled 2018-05-02 (×3): qty 0.4

## 2018-05-02 MED ORDER — METOCLOPRAMIDE HCL 5 MG/ML IJ SOLN
10.0000 mg | Freq: Four times a day (QID) | INTRAMUSCULAR | Status: DC
  Administered 2018-05-02 – 2018-05-04 (×9): 10 mg via INTRAVENOUS
  Filled 2018-05-02 (×9): qty 2

## 2018-05-02 MED ORDER — ASPIRIN EC 81 MG PO TBEC
81.0000 mg | DELAYED_RELEASE_TABLET | Freq: Every day | ORAL | Status: DC
  Administered 2018-05-02 – 2018-05-04 (×3): 81 mg via ORAL
  Filled 2018-05-02 (×3): qty 1

## 2018-05-02 MED ORDER — ONDANSETRON HCL 4 MG/2ML IJ SOLN
4.0000 mg | Freq: Four times a day (QID) | INTRAMUSCULAR | Status: DC | PRN
  Administered 2018-05-02 – 2018-05-03 (×2): 4 mg via INTRAVENOUS
  Filled 2018-05-02 (×2): qty 2

## 2018-05-02 MED ORDER — ONDANSETRON HCL 4 MG PO TABS: 4.0000 mg | ORAL_TABLET | Freq: Four times a day (QID) | ORAL | Status: DC | PRN

## 2018-05-02 MED ORDER — ALBUTEROL SULFATE (2.5 MG/3ML) 0.083% IN NEBU: 2.5000 mg | INHALATION_SOLUTION | Freq: Four times a day (QID) | RESPIRATORY_TRACT | Status: DC | PRN

## 2018-05-02 MED ORDER — HYDROMORPHONE HCL 1 MG/ML IJ SOLN
1.0000 mg | INTRAMUSCULAR | Status: DC | PRN
  Administered 2018-05-02: 1 mg via INTRAVENOUS
  Filled 2018-05-02: qty 1

## 2018-05-02 MED ORDER — HALOPERIDOL LACTATE 5 MG/ML IJ SOLN
5.0000 mg | Freq: Once | INTRAMUSCULAR | Status: AC
  Administered 2018-05-02: 5 mg via INTRAVENOUS

## 2018-05-02 MED ORDER — PANTOPRAZOLE SODIUM 40 MG PO TBEC
40.0000 mg | DELAYED_RELEASE_TABLET | Freq: Every day | ORAL | Status: DC
  Administered 2018-05-02 – 2018-05-04 (×3): 40 mg via ORAL
  Filled 2018-05-02 (×3): qty 1

## 2018-05-02 MED ORDER — HALOPERIDOL LACTATE 5 MG/ML IJ SOLN
INTRAMUSCULAR | Status: AC
  Filled 2018-05-02: qty 1

## 2018-05-02 MED ORDER — HYDROMORPHONE HCL 1 MG/ML IJ SOLN
0.5000 mg | INTRAMUSCULAR | Status: DC | PRN
  Administered 2018-05-03: 0.5 mg via INTRAVENOUS
  Filled 2018-05-02: qty 1

## 2018-05-02 MED ORDER — LORAZEPAM 2 MG/ML IJ SOLN
2.0000 mg | Freq: Once | INTRAMUSCULAR | Status: AC
  Administered 2018-05-02: 2 mg via INTRAVENOUS
  Filled 2018-05-02: qty 1

## 2018-05-02 MED ORDER — SODIUM CHLORIDE 0.9 % IV SOLN
500.0000 mg | Freq: Three times a day (TID) | INTRAVENOUS | Status: DC
  Filled 2018-05-02 (×2): qty 10

## 2018-05-02 MED ORDER — SUCRALFATE 1 G PO TABS
1.0000 g | ORAL_TABLET | Freq: Two times a day (BID) | ORAL | Status: DC
  Administered 2018-05-02 – 2018-05-04 (×5): 1 g via ORAL
  Filled 2018-05-02 (×5): qty 1

## 2018-05-02 MED ORDER — VENLAFAXINE HCL ER 150 MG PO CP24
150.0000 mg | ORAL_CAPSULE | Freq: Every day | ORAL | Status: DC
  Administered 2018-05-02 – 2018-05-04 (×3): 150 mg via ORAL
  Filled 2018-05-02 (×3): qty 1

## 2018-05-02 MED ORDER — BISACODYL 5 MG PO TBEC: 5.0000 mg | DELAYED_RELEASE_TABLET | Freq: Every day | ORAL | Status: DC | PRN

## 2018-05-02 MED ORDER — ROSUVASTATIN CALCIUM 5 MG PO TABS
5.0000 mg | ORAL_TABLET | Freq: Every day | ORAL | Status: DC
  Administered 2018-05-03: 5 mg via ORAL
  Filled 2018-05-02 (×3): qty 1

## 2018-05-02 MED ORDER — DICYCLOMINE HCL 20 MG PO TABS
20.0000 mg | ORAL_TABLET | Freq: Four times a day (QID) | ORAL | Status: DC
  Administered 2018-05-02 – 2018-05-04 (×9): 20 mg via ORAL
  Filled 2018-05-02 (×11): qty 1

## 2018-05-02 MED ORDER — ACETAMINOPHEN 325 MG PO TABS: 650.0000 mg | ORAL_TABLET | Freq: Four times a day (QID) | ORAL | Status: DC | PRN

## 2018-05-02 MED ORDER — INSULIN ASPART 100 UNIT/ML ~~LOC~~ SOLN
0.0000 [IU] | Freq: Three times a day (TID) | SUBCUTANEOUS | Status: DC
  Administered 2018-05-02: 3 [IU] via SUBCUTANEOUS
  Administered 2018-05-02 – 2018-05-03 (×2): 2 [IU] via SUBCUTANEOUS
  Administered 2018-05-04: 3 [IU] via SUBCUTANEOUS
  Filled 2018-05-02 (×4): qty 1

## 2018-05-02 MED ORDER — MECLIZINE HCL 25 MG PO TABS
32.0000 mg | ORAL_TABLET | Freq: Three times a day (TID) | ORAL | Status: DC | PRN
  Filled 2018-05-02: qty 0.5

## 2018-05-02 MED ORDER — LOPERAMIDE HCL 2 MG PO CAPS
2.0000 mg | ORAL_CAPSULE | ORAL | Status: DC | PRN
  Filled 2018-05-02: qty 1

## 2018-05-02 MED ORDER — ISOSORBIDE MONONITRATE ER 60 MG PO TB24
30.0000 mg | ORAL_TABLET | Freq: Every day | ORAL | Status: DC
  Administered 2018-05-02 – 2018-05-03 (×2): 30 mg via ORAL
  Filled 2018-05-02 (×3): qty 1

## 2018-05-02 MED ORDER — ERYTHROMYCIN ETHYLSUCCINATE 200 MG/5ML PO SUSR
200.0000 mg | Freq: Three times a day (TID) | ORAL | Status: DC
  Administered 2018-05-02 – 2018-05-04 (×5): 200 mg via ORAL
  Filled 2018-05-02 (×10): qty 5

## 2018-05-02 MED ORDER — SODIUM CHLORIDE 0.9 % IV SOLN
INTRAVENOUS | Status: DC
  Administered 2018-05-02 – 2018-05-03 (×2): via INTRAVENOUS

## 2018-05-02 MED ORDER — AMITRIPTYLINE HCL 10 MG PO TABS
30.0000 mg | ORAL_TABLET | Freq: Every day | ORAL | Status: DC
  Administered 2018-05-02 – 2018-05-03 (×2): 30 mg via ORAL
  Filled 2018-05-02 (×3): qty 3

## 2018-05-02 MED ORDER — METOPROLOL TARTRATE 50 MG PO TABS
50.0000 mg | ORAL_TABLET | Freq: Two times a day (BID) | ORAL | Status: DC
  Administered 2018-05-02 – 2018-05-04 (×5): 50 mg via ORAL
  Filled 2018-05-02 (×5): qty 1

## 2018-05-02 NOTE — H&P (Addendum)
[facility] NAME: [name] [name]    MR#:  [id number]  DATE OF BIRTH:  29-Feb-1981  DATE OF ADMISSION:  05/02/2018  PRIMARY CARE PHYSICIAN: Kathrine Haddock, NP   REQUESTING/REFERRING PHYSICIAN: Darel Hong, MD  CHIEF COMPLAINT:   Chief Complaint  Patient presents with  . Abdominal Pain  . Chest Pain    HISTORY OF PRESENT ILLNESS:  [name] [name]  is a 37 y.o. female with a known history of T2IDDM who p/w AP/N/V/D. Pt to ED 3d ago, Dx enteritis, Tx Phenergan, D/Ced home. Unable to tolerate PO, continuous N/V x2-3d. Endorses epigastric AP. Cannot see GI [facility]) until July for gastric pacemaker evaluation.  PAST MEDICAL HISTORY:   Past Medical History:  Diagnosis Date  . Chronic systolic CHF (congestive heart failure) ([place])    a. echo 03/2015: EF 30-35%, mild concentric LVH, severe HK of inf, inflat, & lat walls, mod MR, mild TR;  b. 04/2015 LV Gram: EF 55-65%.  . Diabetes type 2, controlled ([place])    a. since 2002   . Diverticulosis, sigmoid 07/2016  . Heavy menses    a. H/O IUD - expired in 2014 - remains in place.  . Hyperlipidemia   . Hypertension   . Iron deficiency anemia   . Ischemic cardiomyopathy    a. 03/2015 EF 30-35% post NSTEMI;  b. 04/2015 EF 55-65% on LV gram.  . Kidney disease   . MI (myocardial infarction) ([place]) 03/29/2015  . Obesity   . Renal insufficiency   . Tobacco abuse    a. quit 03/2015.  Marland Kitchen Uterine fibroid   . Vertigo   . Vitamin D deficiency     PAST SURGICAL HISTORY:   Past Surgical History:  Procedure Laterality Date  . APPENDECTOMY    . CARDIAC CATHETERIZATION  4/16   x2 stent [facility]  . CARDIAC CATHETERIZATION N/A 05/05/2015   Procedure: Left Heart Cath and Coronary Angiography;  Surgeon: Peter M Martinique, MD;  Location: [facility] CV LAB;  Service: Cardiovascular;  Laterality: N/A;  . COLONOSCOPY WITH PROPOFOL N/A 07/31/2016   Procedure: COLONOSCOPY WITH PROPOFOL;  Surgeon: Lollie Sails, MD;  Location[facility] ENDOSCOPY;  Service: Endoscopy;  Laterality: N/A;  . ESOPHAGOGASTRODUODENOSCOPY (EGD) WITH PROPOFOL N/A 07/31/2016   Procedure: ESOPHAGOGASTRODUODENOSCOPY (EGD) WITH PROPOFOL;  Surgeon: Lollie Sails, MD;  Location: [facility] ENDOSCOPY;  Service: Endoscopy;  Laterality: N/A;  . LAPAROSCOPIC APPENDECTOMY N/A 08/07/2016   Procedure: APPENDECTOMY LAPAROSCOPIC;  Surgeon: Hubbard Robinson, MD;  Location: [facility] ORS;  Service: General;  Laterality: N/A;  . MOUTH SURGERY      SOCIAL HISTORY:   Social History   Tobacco Use  . Smoking status: Former Smoker    Years: 15.00    Last attempt to quit: 03/10/2015    Years since quitting: 3.1  . Smokeless tobacco: Never Used  Substance Use Topics  . Alcohol use: No    Alcohol/week: 0.0 oz    FAMILY HISTORY:   Family History  Problem Relation Age of Onset  . Diabetes Father   . Cancer Maternal Grandmother        lung  . Cancer Maternal Grandfather        prostate  . Diabetes Paternal Grandfather   . Diabetes Paternal Grandmother   . Cancer - Cervical Maternal Aunt     DRUG ALLERGIES:   Allergies  Allergen Reactions  . Lisinopril Anaphylaxis, Itching and Swelling  . Rosemary Oil Anaphylaxis  .  Shellfish Allergy Itching and Swelling  . Metformin And Related Diarrhea, Nausea And Vomiting and Rash    REVIEW OF SYSTEMS:   Review of Systems  Constitutional: Positive for malaise/fatigue. Negative for chills, diaphoresis, fever and weight loss.  HENT: Negative for congestion, ear pain, hearing loss, nosebleeds, sinus pain, sore throat and tinnitus.   Eyes: Negative for blurred vision, double vision and photophobia.  Respiratory: Negative for cough, hemoptysis, sputum production, shortness of breath and wheezing.   Cardiovascular: Negative for chest pain, palpitations, orthopnea, claudication, leg swelling and PND.  Gastrointestinal: Positive for abdominal pain, diarrhea, nausea and vomiting. Negative for blood  in stool, constipation, heartburn and melena.  Genitourinary: Negative for dysuria, frequency, hematuria and urgency.  Musculoskeletal: Negative for back pain, joint pain, myalgias and neck pain.  Skin: Negative for itching and rash.  Neurological: Positive for weakness. Negative for dizziness, tingling, tremors, sensory change, speech change, focal weakness, seizures, loss of consciousness and headaches.  Psychiatric/Behavioral: Negative for memory loss. The patient does not have insomnia.     MEDICATIONS AT HOME:   Prior to Admission medications   Medication Sig Start Date End Date Taking? Authorizing Provider  albuterol (PROVENTIL HFA;VENTOLIN HFA) 108 (90 Base) MCG/ACT inhaler Inhale 2 puffs into the lungs every 6 (six) hours as needed for wheezing or shortness of breath. 02/19/17  Yes Volney American, PA-C  amitriptyline (ELAVIL) 10 MG tablet Take 1-2 tablets (10-20 mg total) by mouth at bedtime. Take 2 tablets at bedtime. Total of 20mg  nightly Patient taking differently: Take 30 mg at bedtime by mouth.  05/15/17  Yes Armbruster, Carlota Raspberry, MD  aspirin EC 81 MG tablet Take 81 mg by mouth daily.   Yes [provider]  benzonatate (TESSALON) 100 MG capsule Take 1 capsule (100 mg total) by mouth 2 (two) times daily as needed for cough. 01/07/18  Yes Kathrine Haddock, NP  Cholecalciferol 2000 units TABS Take 1 tablet by mouth daily.   Yes [provider]  dicyclomine (BENTYL) 20 MG tablet Take 1 tablet (20 mg total) by mouth every 6 (six) hours. 01/09/18  Yes Kathrine Haddock, NP  etonogestrel (NEXPLANON) 68 MG IMPL implant 1 each by Subdermal route once.   Yes [provider]  ferrous sulfate 325 (65 FE) MG tablet TAKE ONE TABLET BY MOUTH THREE TIMES DAILY WITH MEALS Patient taking differently: take one tablet by mouth once daily 03/03/17  Yes Kathrine Haddock, NP  glimepiride (AMARYL) 4 MG tablet Take 1 tablet (4 mg total) by mouth daily. 12/17/17  Yes Kathrine Haddock, NP    insulin aspart (NOVOLOG FLEXPEN) 100 UNIT/ML FlexPen TAKE UP TO 50 UNITS DAILY IN DIVIDED DOSES AS DIRECTED 02/24/18  Yes [provider]  Insulin Glargine (LANTUS SOLOSTAR) 100 UNIT/ML Solostar Pen Inject 60 Units into the skin daily at 10 pm. 10/02/17  Yes Kathrine Haddock, NP  isosorbide mononitrate (IMDUR) 30 MG 24 hr tablet TAKE 1 TABLET BY MOUTH ONCE DAILY 10/06/17  Yes Wellington Hampshire, MD  loperamide (IMODIUM) 2 MG capsule Take 2 mg by mouth as needed for diarrhea or loose stools.   Yes [provider]  meclizine (ANTIVERT) 32 MG tablet Take 1 tablet (32 mg total) by mouth 3 (three) times daily as needed. Patient taking differently: Take 32 mg by mouth 3 (three) times daily as needed for dizziness or nausea.  06/21/16  Yes Kathrine Haddock, NP  metoCLOPramide (REGLAN) 10 MG tablet Take 1 tablet (10 mg total) by mouth 3 (three) times  daily before meals. 06/02/17  Yes Armbruster, Carlota Raspberry, MD  metoprolol tartrate (LOPRESSOR) 50 MG tablet Take 1 tablet (50 mg total) by mouth 2 (two) times daily. Need office visit 01/07/18  Yes [name] A, FNP  mupirocin ointment (BACTROBAN) 2 % Place 1 application into the nose 2 (two) times daily. 01/24/18  Yes Georgette Shell, MD  nitroGLYCERIN (NITROSTAT) 0.4 MG SL tablet Place 1 tablet (0.4 mg total) under the tongue every 5 (five) minutes as needed for chest pain. Reported on 02/20/2016 03/17/17  Yes [name] A, FNP  pantoprazole (PROTONIX) 40 MG tablet TAKE ONE TABLET BY MOUTH ONCE DAILY AT 6 AM. 12/17/17  Yes Kathrine Haddock, NP  prochlorperazine (COMPAZINE) 10 MG tablet Take 1 tablet (10 mg total) by mouth every 6 (six) hours as needed for nausea or vomiting. 04/30/18  Yes Merlyn Lot, MD  promethazine (PHENERGAN) 25 MG tablet Take 1 tablet (25 mg total) by mouth every 8 (eight) hours as needed for nausea or vomiting. 04/16/18  Yes Johnson, Megan P, DO  rosuvastatin (CRESTOR) 5 MG tablet TAKE ONE TABLET BY MOUTH ONCE DAILY 06/02/17   Yes Wellington Hampshire, MD  sucralfate (CARAFATE) 1 g tablet Take 1 tablet (1 g total) by mouth 2 (two) times daily. 04/30/18 04/30/19 Yes Merlyn Lot, MD  venlafaxine XR (EFFEXOR-XR) 150 MG 24 hr capsule Take 150 mg by mouth daily with breakfast.    Yes [provider]  Vitamin D, Ergocalciferol, (DRISDOL) 50000 units CAPS capsule Take 1 capsule (50,000 Units total) by mouth every 7 (seven) days. 04/07/18  Yes Kathrine Haddock, NP  glucose blood (ACCU-CHEK ACTIVE STRIPS) test strip Use as instructed 02/11/18   Volney American, PA-C  glucose blood (ACCU-CHEK AVIVA) test strip 1 each by Other route as needed for other. Use as instructed    [provider]  Insulin Pen Needle 32G X 4 MM MISC 1 Units by Does not apply route every morning. Pen needles 08/27/17   Kathrine Haddock, NP      VITAL SIGNS:  Blood pressure (!) 159/76, pulse (!) 116, temperature 98.8 F (37.1 C), temperature source Oral, resp. rate 18, SpO2 100 %.  PHYSICAL EXAMINATION:  Physical Exam  Constitutional: She is oriented to person, place, and time. She appears well-developed and well-nourished. She is active and cooperative.  Non-toxic appearance. She appears ill. She appears distressed. She is not intubated.  HENT:  Head: Normocephalic and atraumatic.  Mouth/Throat: Oropharynx is clear and moist. No oropharyngeal exudate.  Eyes: Conjunctivae, EOM and lids are normal. No scleral icterus.  Neck: Neck supple. No JVD present. No thyromegaly present.  Cardiovascular: Regular rhythm, S1 normal and S2 normal.  No extrasystoles are present. Tachycardia present. Exam reveals no gallop, no S3, no S4, no distant heart sounds and no friction rub.  No murmur heard. Pulmonary/Chest: Effort normal and breath sounds normal. No accessory muscle usage or stridor. No apnea, no tachypnea and no bradypnea. She is not intubated. No respiratory distress. She has no decreased breath sounds. She has no wheezes. She has no  rhonchi. She has no rales.  Abdominal: Soft. Normal appearance. She exhibits distension. She exhibits no ascites. Bowel sounds are decreased. There is generalized tenderness and tenderness in the epigastric area. There is no rigidity, no rebound and no guarding.  Musculoskeletal: She exhibits no edema or tenderness.       Right lower leg: Normal. She exhibits no tenderness and no edema.       Left  lower leg: Normal. She exhibits no tenderness and no edema.  Lymphadenopathy:    She has no cervical adenopathy.  Neurological: She is alert and oriented to person, place, and time. She is not disoriented.  Skin: Skin is warm, dry and intact. No rash noted. She is not diaphoretic. No erythema.  Psychiatric: She has a normal mood and affect. Her speech is normal and behavior is normal. Judgment and thought content normal. Her mood appears not anxious. She is not agitated. Cognition and memory are normal.    LABORATORY PANEL:   CBC Recent Labs  Lab 05/01/18 2044  WBC 10.0  HGB 11.7*  HCT 35.2  PLT 440   ------------------------------------------------------------------------------------------------------------------  Chemistries  Recent Labs  Lab 05/01/18 2044  NA 140  K 3.8  CL 110  CO2 18*  GLUCOSE 187*  BUN 31*  CREATININE 1.53*  CALCIUM 9.4  MG 1.9  AST 16  ALT 13*  ALKPHOS 112  BILITOT 0.6   ------------------------------------------------------------------------------------------------------------------  Cardiac Enzymes No results for input(s): TROPONINI in the last 168 hours. ------------------------------------------------------------------------------------------------------------------  RADIOLOGY:  No results found.    IMPRESSION AND PLAN:   A/P: 58F gastroparesis flare, AP/N/V/D, hyperglycemia, prerenal AKI on CKD III, hypoalbuminemia, microcytic anemia. -Symptomatic mgmt, pain ctrl, antiemetics -Reglan scheduled QID -Erythromycin PO TID before  meals -Pharmacy does not have IV Erythromycin -Lantus resumed at 2/3 home dose (NPO) -IVF -SSI -Cr 1.5, increased from 1.2 ~3wk ago, prerenal AKI superimposed on CKD III (2/2 DM). Monitor BMP, avoid nephrotoxins -Hypoalbuminemia likely 2/2 starvation -Microcytic anemia likely iron deficiency despite PO supplements, outpt f/up w/ consideration for possible IV iron infusion -Home meds as tolerated -Advance diet as tolerated -Lovenox -Full code -Observation, < 2 midnights   All the records are reviewed and case discussed with ED provider. Management plans discussed with the patient, family and they are in agreement.  CODE STATUS: Full code  TOTAL TIME TAKING CARE OF THIS PATIENT: 60 minutes.    Arta Silence M.D on 05/02/2018 at 6:51 AM  Between 7am to 6pm - Pager - [phone]  After 6pm go to [email or web address] - Proofreader  [facility]  Office  [phone]  CC: Primary care physician; Kathrine Haddock, NP   Note: This dictation was prepared with Dragon dictation along with smaller phrase technology. Any transcriptional errors that result from this process are unintentional.

## 2018-05-02 NOTE — Progress Notes (Signed)
Patient ID: [name], female   DOB: 03-Jun-1981, 37 y.o.   MRN: [id number]  [facility] PROGRESS NOTE  [name] [name] YIF:[id number] DOB: [birth year] DOA: 05/02/2018 PCP: Kathrine Haddock, NP  HPI/Subjective: Patient had no further diarrhea since coming into the hospital.  Able to sip some liquids without vomiting.  Asking to advance her diet.  Still having some epigastric pain.  Objective: Vitals:   05/02/18 0502 05/02/18 0824  BP: (!) 159/76 134/65  Pulse: (!) 116 (!) 106  Resp: 18 18  Temp: 98.8 F (37.1 C)   SpO2: 100% 95%    ROS: Review of Systems  Constitutional: Negative for chills and fever.  Eyes: Negative for blurred vision.  Respiratory: Negative for cough and shortness of breath.   Cardiovascular: Positive for chest pain.  Gastrointestinal: Positive for abdominal pain, diarrhea, nausea and vomiting. Negative for constipation.  Genitourinary: Negative for dysuria.  Musculoskeletal: Negative for joint pain.  Neurological: Negative for dizziness and headaches.   Exam: Physical Exam  Constitutional: She is oriented to person, place, and time.  HENT:  Nose: No mucosal edema.  Mouth/Throat: No oropharyngeal exudate or posterior oropharyngeal edema.  Eyes: Pupils are equal, round, and reactive to light. Conjunctivae, EOM and lids are normal.  Neck: No JVD present. Carotid bruit is not present. No edema present. No thyroid mass and no thyromegaly present.  Cardiovascular: S1 normal and S2 normal. Exam reveals no gallop.  No murmur heard. Pulses:      Dorsalis pedis pulses are 2+ on the right side, and 2+ on the left side.  Respiratory: No respiratory distress. She has no wheezes. She has no rhonchi. She has no rales.  GI: Soft. Bowel sounds are normal. There is tenderness in the epigastric area.  Musculoskeletal:       Right ankle: She exhibits no swelling.       Left ankle: She exhibits no swelling.  Lymphadenopathy:    She has no cervical adenopathy.   Neurological: She is alert and oriented to person, place, and time. No cranial nerve deficit.  Skin: Skin is warm. No rash noted. Nails show no clubbing.  Psychiatric: She has a normal mood and affect.      Data Reviewed: Basic Metabolic Panel: Recent Labs  Lab 04/29/18 1850 05/01/18 2044  NA 140 140  K 4.5 3.8  CL 111 110  CO2 19* 18*  GLUCOSE 181* 187*  BUN 33* 31*  CREATININE 1.57* 1.53*  CALCIUM 9.2 9.4  MG  --  1.9  PHOS  --  4.6   Liver Function Tests: Recent Labs  Lab 04/29/18 1850 05/01/18 2044  AST 17 16  ALT 14 13*  ALKPHOS 114 112  BILITOT 0.4 0.6  PROT 7.9 7.9  ALBUMIN 3.2* 3.4*   Recent Labs  Lab 04/29/18 1850 05/01/18 2044  LIPASE 19 17   CBC: Recent Labs  Lab 04/29/18 1850 05/01/18 2044  WBC 8.9 10.0  HGB 11.9* 11.7*  HCT 35.3 35.2  MCV 79.0* 78.6*  PLT 432 440   BNP (last 3 results) Recent Labs    10/08/17 0448  BNP 34.0    CBG: Recent Labs  Lab 05/02/18 0822 05/02/18 1223  GLUCAP 156* 107*    Scheduled Meds: . amitriptyline  30 mg Oral QHS  . aspirin EC  81 mg Oral Daily  . dicyclomine  20 mg Oral Q6H  . enoxaparin (LOVENOX) injection  40 mg Subcutaneous Q24H  . erythromycin ethylsuccinate  200 mg Oral TID AC  .  insulin aspart  0-15 Units Subcutaneous TID WC  . insulin glargine  40 Units Subcutaneous QHS  . isosorbide mononitrate  30 mg Oral Daily  . metoCLOPramide (REGLAN) injection  10 mg Intravenous Q6H  . metoprolol tartrate  50 mg Oral BID  . pantoprazole  40 mg Oral Daily  . rosuvastatin  5 mg Oral Daily  . sucralfate  1 g Oral BID  . venlafaxine XR  150 mg Oral Q breakfast   Continuous Infusions: . sodium chloride      Assessment/Plan:  1. Nausea vomiting and diarrhea.  Could be diabetic gastroparesis.  Continue supportive care with gentle IV fluid hydration, PRN nausea medications, PRN pain medications.  Send off stool studies if any further diarrhea.  Patient on Reglan Protonix 2. Acute kidney  injury gentle IV fluid hydration 3. Essential hypertension, history of CAD on aspirin metoprolol and Crestor 4. Hyperlipidemia unspecified on Crestor 5. Morbid obesity.  Weight loss needed 6. Type 2 diabetes mellitus on glargine insulin and sliding scale  Code Status:     Code Status Orders  (From admission, onward)        Start     Ordered   05/02/18 0456  Full code  Continuous     05/02/18 0455    Code Status History    Date Active Date Inactive Code Status Order ID Comments User Context   01/22/2018 0103 01/24/2018 1711 Full Code [id number]  Phillips Grout, MD ED   08/03/2017 2031 08/04/2017 2006 Full Code [id number]  Dustin Flock, MD Inpatient   08/05/2016 0832 08/08/2016 2008 Full Code [id number]  Clayburn Pert, MD Inpatient   03/01/2016 0612 03/01/2016 1954 Full Code [id number]  Harrie Foreman, MD Inpatient   08/21/2015 1736 08/22/2015 2203 Full Code [id number]  Dustin Flock, MD ED   07/22/2015 1105 07/23/2015 1832 Full Code [id number]  Epifanio Lesches, MD Inpatient   05/05/2015 1059 05/05/2015 2055 Full Code [id number]  Martinique, Peter M, MD Inpatient   05/05/2015 [phone] 05/05/2015 1059 Full Code [id number]  Rise Patience, MD Inpatient     Family Communication: Family at bedside Disposition Plan: Must be able to tolerate solid food prior to disposition  Time spent: 38 minutes  [place]

## 2018-05-02 NOTE — ED Provider Notes (Signed)
[facility] Emergency Department Provider Note  ____________________________________________   First MD Initiated Contact with Patient 05/02/18 0110     (approximate)  I have reviewed the triage vital signs and the nursing notes.   HISTORY  Chief Complaint Abdominal Pain and Chest Pain   HPI [name] [name] is a 37 y.o. female who comes to the emergency department with several days of cramping abdominal pain nausea vomiting and difficulty keeping food down.  She has a long-standing history of poorly controlled diabetes and is been diagnosed with gastroparesis.  She was in our emergency department 3 days ago where she had a CT scan showing small bowel enteritis and she was discharged home with symptomatic treatment.  She says she has been unable to eat ever since then.  She does have loose stools every day.  Her symptoms are constant worse when trying to eat and improved somewhat with rest.  She takes Phenergan and Zofran at home with minimal relief.  No recent antibiotics.  Past Medical History:  Diagnosis Date  . Chronic systolic CHF (congestive heart failure) ([place])    a. echo 03/2015: EF 30-35%, mild concentric LVH, severe HK of inf, inflat, & lat walls, mod MR, mild TR;  b. 04/2015 LV Gram: EF 55-65%.  . Diabetes type 2, controlled ([place])    a. since 2002   . Diverticulosis, sigmoid 07/2016  . Heavy menses    a. H/O IUD - expired in 2014 - remains in place.  . Hyperlipidemia   . Hypertension   . Iron deficiency anemia   . Ischemic cardiomyopathy    a. 03/2015 EF 30-35% post NSTEMI;  b. 04/2015 EF 55-65% on LV gram.  . Kidney disease   . MI (myocardial infarction) ([place]) 03/29/2015  . Obesity   . Renal insufficiency   . Tobacco abuse    a. quit 03/2015.  Marland Kitchen Uterine fibroid   . Vertigo   . Vitamin D deficiency     Patient Active Problem List   Diagnosis Date Noted  . Diabetic gastroparesis associated with type 2 diabetes mellitus ([place]) 05/02/2018  .  CKD stage 3 due to type 2 diabetes mellitus ([place]) 04/06/2018  . Acute lower UTI 01/22/2018  . Gastroparesis 10/27/2017  . Poorly controlled type 2 diabetes mellitus ([place]) 08/27/2017  . Lung nodule < 6cm on CT 08/12/2017  . Depression, major, single episode, severe ([place]) 08/12/2017  . Chronic abdominal pain 08/03/2017  . Obstructive sleep apnea 05/16/2017  . Neuropathy 01/17/2017  . Edema of both ankles 01/17/2017  . Fatigue 12/16/2016  . Myalgic 10/25/2016  . Vitamin D deficiency 10/25/2016  . Insomnia 10/25/2016  . Urinary tract infection 09/20/2016  . HTN (hypertension) 09/05/2016  . Vertigo 06/21/2016  . Elevated liver function tests 06/05/2016  . Chronic diastolic heart failure ([place]) 03/19/2016  . Tachycardia 03/19/2016  . IBS (irritable bowel syndrome) 12/15/2015  . Proteinuria 08/30/2015  . Coronary artery disease involving native coronary artery of native heart with angina pectoris with documented spasm ([place])   . Angina pectoris ([place])   . Iron deficiency anemia   . Heavy menses   . SOB (shortness of breath) 08/21/2015  . Hyperlipidemia 04/13/2015  . Ischemic cardiomyopathy   . Obesity     Past Surgical History:  Procedure Laterality Date  . APPENDECTOMY    . CARDIAC CATHETERIZATION  4/16   x2 stent [facility]  . CARDIAC CATHETERIZATION N/A 05/05/2015   Procedure: Left Heart Cath and Coronary Angiography;  Surgeon: [name]  M Martinique, MD;  Location: [place] CV LAB;  Service: Cardiovascular;  Laterality: N/A;  . COLONOSCOPY WITH PROPOFOL N/A 07/31/2016   Procedure: COLONOSCOPY WITH PROPOFOL;  Surgeon: Lollie Sails, MD;  Location: [facility] ENDOSCOPY;  Service: Endoscopy;  Laterality: N/A;  . ESOPHAGOGASTRODUODENOSCOPY (EGD) WITH PROPOFOL N/A 07/31/2016   Procedure: ESOPHAGOGASTRODUODENOSCOPY (EGD) WITH PROPOFOL;  Surgeon: Lollie Sails, MD;  Location: [facility] ENDOSCOPY;  Service: Endoscopy;  Laterality: N/A;  . LAPAROSCOPIC APPENDECTOMY N/A 08/07/2016   Procedure: APPENDECTOMY  LAPAROSCOPIC;  Surgeon: Hubbard Robinson, MD;  Location: [facility] ORS;  Service: General;  Laterality: N/A;  . MOUTH SURGERY      Prior to Admission medications   Medication Sig Start Date End Date Taking? Authorizing Provider  albuterol (PROVENTIL HFA;VENTOLIN HFA) 108 (90 Base) MCG/ACT inhaler Inhale 2 puffs into the lungs every 6 (six) hours as needed for wheezing or shortness of breath. 02/19/17  Yes Volney American, PA-C  amitriptyline (ELAVIL) 10 MG tablet Take 1-2 tablets (10-20 mg total) by mouth at bedtime. Take 2 tablets at bedtime. Total of 20mg  nightly Patient taking differently: Take 30 mg at bedtime by mouth.  05/15/17  Yes Armbruster, Carlota Raspberry, MD  aspirin EC 81 MG tablet Take 81 mg by mouth daily.   Yes [provider]  benzonatate (TESSALON) 100 MG capsule Take 1 capsule (100 mg total) by mouth 2 (two) times daily as needed for cough. 01/07/18  Yes Kathrine Haddock, NP  Cholecalciferol 2000 units TABS Take 1 tablet by mouth daily.   Yes [provider]  dicyclomine (BENTYL) 20 MG tablet Take 1 tablet (20 mg total) by mouth every 6 (six) hours. 01/09/18  Yes Kathrine Haddock, NP  etonogestrel (NEXPLANON) 68 MG IMPL implant 1 each by Subdermal route once.   Yes [provider]  ferrous sulfate 325 (65 FE) MG tablet TAKE ONE TABLET BY MOUTH THREE TIMES DAILY WITH MEALS Patient taking differently: take one tablet by mouth once daily 03/03/17  Yes Kathrine Haddock, NP  glimepiride (AMARYL) 4 MG tablet Take 1 tablet (4 mg total) by mouth daily. 12/17/17  Yes Kathrine Haddock, NP  insulin aspart (NOVOLOG FLEXPEN) 100 UNIT/ML FlexPen TAKE UP TO 50 UNITS DAILY IN DIVIDED DOSES AS DIRECTED 02/24/18  Yes [provider]  Insulin Glargine (LANTUS SOLOSTAR) 100 UNIT/ML Solostar Pen Inject 60 Units into the skin daily at 10 pm. 10/02/17  Yes Kathrine Haddock, NP  isosorbide mononitrate (IMDUR) 30 MG 24 hr tablet TAKE 1 TABLET BY MOUTH ONCE DAILY 10/06/17  Yes Wellington Hampshire, MD  loperamide (IMODIUM) 2 MG capsule Take 2 mg by mouth as needed for diarrhea or loose stools.   Yes [provider]  meclizine (ANTIVERT) 32 MG tablet Take 1 tablet (32 mg total) by mouth 3 (three) times daily as needed. Patient taking differently: Take 32 mg by mouth 3 (three) times daily as needed for dizziness or nausea.  06/21/16  Yes Kathrine Haddock, NP  metoCLOPramide (REGLAN) 10 MG tablet Take 1 tablet (10 mg total) by mouth 3 (three) times daily before meals. 06/02/17  Yes Armbruster, Carlota Raspberry, MD  metoprolol tartrate (LOPRESSOR) 50 MG tablet Take 1 tablet (50 mg total) by mouth 2 (two) times daily. Need office visit 01/07/18  Yes [name] A, FNP  mupirocin ointment (BACTROBAN) 2 % Place 1 application into the nose 2 (two) times daily. 01/24/18  Yes Georgette Shell, MD  nitroGLYCERIN (NITROSTAT) 0.4 MG SL tablet Place 1 tablet (0.4 mg  total) under the tongue every 5 (five) minutes as needed for chest pain. Reported on 02/20/2016 03/17/17  Yes [name] A, FNP  pantoprazole (PROTONIX) 40 MG tablet TAKE ONE TABLET BY MOUTH ONCE DAILY AT 6 AM. 12/17/17  Yes Kathrine Haddock, NP  prochlorperazine (COMPAZINE) 10 MG tablet Take 1 tablet (10 mg total) by mouth every 6 (six) hours as needed for nausea or vomiting. 04/30/18  Yes Merlyn Lot, MD  promethazine (PHENERGAN) 25 MG tablet Take 1 tablet (25 mg total) by mouth every 8 (eight) hours as needed for nausea or vomiting. 04/16/18  Yes Johnson, Megan P, DO  rosuvastatin (CRESTOR) 5 MG tablet TAKE ONE TABLET BY MOUTH ONCE DAILY 06/02/17  Yes Wellington Hampshire, MD  sucralfate (CARAFATE) 1 g tablet Take 1 tablet (1 g total) by mouth 2 (two) times daily. 04/30/18 04/30/19 Yes Merlyn Lot, MD  venlafaxine XR (EFFEXOR-XR) 150 MG 24 hr capsule Take 150 mg by mouth daily with breakfast.    Yes [provider]  Vitamin D, Ergocalciferol, (DRISDOL) 50000 units CAPS capsule Take 1 capsule (50,000 Units total) by mouth  every 7 (seven) days. 04/07/18  Yes Kathrine Haddock, NP  glucose blood (ACCU-CHEK ACTIVE STRIPS) test strip Use as instructed 02/11/18   Volney American, PA-C  glucose blood (ACCU-CHEK AVIVA) test strip 1 each by Other route as needed for other. Use as instructed    [provider]  Insulin Pen Needle 32G X 4 MM MISC 1 Units by Does not apply route every morning. Pen needles 08/27/17   Kathrine Haddock, NP    Allergies Lisinopril; Rosemary oil; Shellfish allergy; and Metformin and related  Family History  Problem Relation Age of Onset  . Diabetes Father   . Cancer Maternal Grandmother        lung  . Cancer Maternal Grandfather        prostate  . Diabetes Paternal Grandfather   . Diabetes Paternal Grandmother   . Cancer - Cervical Maternal Aunt     Social History Social History   Tobacco Use  . Smoking status: Former Smoker    Years: 15.00    Last attempt to quit: 03/10/2015    Years since quitting: 3.1  . Smokeless tobacco: Never Used  Substance Use Topics  . Alcohol use: No    Alcohol/week: 0.0 oz  . Drug use: No    Review of Systems Constitutional: No fever/chills Eyes: No visual changes. ENT: No sore throat. Cardiovascular: Denies chest pain. Respiratory: Denies shortness of breath. Gastrointestinal: Positive for abdominal pain.  Positive for nausea, positive for vomiting.  Positive for diarrhea.  No constipation. Genitourinary: Negative for dysuria. Musculoskeletal: Negative for back pain. Skin: Negative for rash. Neurological: Negative for headaches, focal weakness or numbness.   ____________________________________________   PHYSICAL EXAM:  VITAL SIGNS: ED Triage Vitals [05/01/18 2043]  Enc Vitals Group     BP (!) 162/95     Pulse Rate (!) 122     Resp (!) 26     Temp      Temp src      SpO2 100 %     Weight      Height      Head Circumference      Peak Flow      Pain Score      Pain Loc      Pain Edu?      Excl. in [place]?      Constitutional: Sitting up or retching and vomiting  very uncomfortable appearing Eyes: PERRL EOMI. Head: Atraumatic. Nose: No congestion/rhinnorhea. Mouth/Throat: No trismus Neck: No stridor.   Cardiovascular: Tachycardic rate, regular rhythm. Grossly normal heart sounds.  Good peripheral circulation. Respiratory: Normal respiratory effort.  No retractions. Lungs CTAB and moving good air Gastrointestinal: Soft abdomen mildly obese diffusely tender with no focality no rebound or guarding or peritonitis Musculoskeletal: No lower extremity edema   Neurologic:  Normal speech and language. No gross focal neurologic deficits are appreciated. Skin:  Skin is warm, dry and intact. No rash noted. Psychiatric: Mood and affect are normal. Speech and behavior are normal.    ____________________________________________   DIFFERENTIAL includes but not limited to  Dehydration, starvation, gastroparesis, enteritis, bowel obstruction ____________________________________________   LABS (all labs ordered are listed, but only abnormal results are displayed)  Labs Reviewed  COMPREHENSIVE METABOLIC PANEL - Abnormal; Notable for the following components:      Result Value   CO2 18 (*)    Glucose, Bld 187 (*)    BUN 31 (*)    Creatinine, Ser 1.53 (*)    Albumin 3.4 (*)    ALT 13 (*)    GFR calc non Af Amer 42 (*)    GFR calc Af Amer 49 (*)    All other components within normal limits  CBC - Abnormal; Notable for the following components:   Hemoglobin 11.7 (*)    MCV 78.6 (*)    RDW 15.4 (*)    All other components within normal limits  URINALYSIS, COMPLETE (UACMP) WITH MICROSCOPIC - Abnormal; Notable for the following components:   Color, Urine YELLOW (*)    APPearance CLOUDY (*)    Hgb urine dipstick MODERATE (*)    Protein, ur 100 (*)    Leukocytes, UA LARGE (*)    WBC, UA >50 (*)    All other components within normal limits  BETA-HYDROXYBUTYRIC ACID - Abnormal; Notable for the  following components:   Beta-Hydroxybutyric Acid 2.54 (*)    All other components within normal limits  URINE DRUG SCREEN, QUALITATIVE ([facility] ONLY) - Abnormal; Notable for the following components:   Tricyclic, Ur Screen POSITIVE (*)    Opiate, Ur Screen POSITIVE (*)    Barbiturates, Ur Screen POSITIVE (*)    All other components within normal limits  GLUCOSE, CAPILLARY - Abnormal; Notable for the following components:   Glucose-Capillary 156 (*)    All other components within normal limits  GLUCOSE, CAPILLARY - Abnormal; Notable for the following components:   Glucose-Capillary 107 (*)    All other components within normal limits  GLUCOSE, CAPILLARY - Abnormal; Notable for the following components:   Glucose-Capillary 136 (*)    All other components within normal limits  GLUCOSE, CAPILLARY - Abnormal; Notable for the following components:   Glucose-Capillary 125 (*)    All other components within normal limits  GLUCOSE, CAPILLARY - Abnormal; Notable for the following components:   Glucose-Capillary 148 (*)    All other components within normal limits  BASIC METABOLIC PANEL - Abnormal; Notable for the following components:   Potassium 3.3 (*)    CO2 21 (*)    Glucose, Bld 139 (*)    BUN 24 (*)    Creatinine, Ser 1.41 (*)    Calcium 8.0 (*)    GFR calc non Af Amer 47 (*)    GFR calc Af Amer 54 (*)    All other components within normal limits  GLUCOSE, CAPILLARY - Abnormal; Notable for the following components:  Glucose-Capillary 107 (*)    All other components within normal limits  GLUCOSE, CAPILLARY - Abnormal; Notable for the following components:   Glucose-Capillary 153 (*)    All other components within normal limits  GLUCOSE, CAPILLARY - Abnormal; Notable for the following components:   Glucose-Capillary 152 (*)    All other components within normal limits  GASTROINTESTINAL PANEL BY PCR, STOOL (REPLACES STOOL CULTURE)  C DIFFICILE QUICK SCREEN W PCR REFLEX  LIPASE,  BLOOD  HCG, QUANTITATIVE, PREGNANCY  MAGNESIUM  PHOSPHORUS  GLUCOSE, CAPILLARY  POC URINE PREG, ED    Lab work reviewed by me with elevated beta hydroxybutyric acid consistent with starvation __________________________________________  EKG  ED ECG REPORT I, Darel Hong, the attending physician, personally viewed and interpreted this ECG.  Date: 05/01/2018 EKG Time:  Rate: 116 Rhythm: Sinus tachycardia QRS Axis: Leftward axis Intervals: normal ST/T Wave abnormalities: normal Narrative Interpretation: no evidence of acute ischemia  ____________________________________________  RADIOLOGY   ____________________________________________   PROCEDURES  Procedure(s) performed: yes  Angiocath insertion Performed by: Darel Hong  Consent: Verbal consent obtained. Risks and benefits: risks, benefits and alternatives were discussed Time out: Immediately prior to procedure a "time out" was called to verify the correct patient, procedure, equipment, support staff and site/side marked as required.  Preparation: Patient was prepped and draped in the usual sterile fashion.  Vein Location: left upper extremity  Ultrasound Guided  Gauge: 18  Normal blood return and flush without difficulty Patient tolerance: Patient tolerated the procedure well with no immediate complications.     Procedures  Critical Care performed: no  Observation: no ____________________________________________   INITIAL IMPRESSION / ASSESSMENT AND PLAN / ED COURSE  Pertinent labs & imaging results that were available during my care of the patient were reviewed by me and considered in my medical decision making (see chart for details).  On arrival the patient is obviously uncomfortable appearing tachycardic with borderline low blood pressure.  She is diffusely tender abdomen.  I discussed with the patient her blood work and results from the CT scan previously and that I do believe this is  likely related to her gastroparesis.  She is actively vomiting during my exam.  Haldol and Ativan now for antiemetic effects and the sedative effects.    ----------------------------------------- 2:36 AM on 05/02/2018 -----------------------------------------  The patient is resting comfortably.  We will attempt an oral challenge in a moment.  Patient failed her p.o. challenge.  She is significantly dehydrated and start.  At this point she requires inpatient admission for further antiemetics as well as fluid resuscitation.  The patient and family verbalized understanding agree with the plan.  I then discussed with the hospitalist who has graciously agreed to admit the patient to his service.  ____________________________________________   FINAL CLINICAL IMPRESSION(S) / ED DIAGNOSES  Final diagnoses:  Gastroparesis  Starvation, subsequent encounter  Dehydration      NEW MEDICATIONS STARTED DURING THIS VISIT:  Current Discharge Medication List       Note:  This document was prepared using Dragon voice recognition software and may include unintentional dictation errors.     Darel Hong, MD 05/04/18 1009

## 2018-05-03 DIAGNOSIS — E1122 Type 2 diabetes mellitus with diabetic chronic kidney disease: Secondary | ICD-10-CM | POA: Diagnosis present

## 2018-05-03 DIAGNOSIS — I252 Old myocardial infarction: Secondary | ICD-10-CM | POA: Diagnosis not present

## 2018-05-03 DIAGNOSIS — K5730 Diverticulosis of large intestine without perforation or abscess without bleeding: Secondary | ICD-10-CM | POA: Diagnosis present

## 2018-05-03 DIAGNOSIS — Z833 Family history of diabetes mellitus: Secondary | ICD-10-CM | POA: Diagnosis not present

## 2018-05-03 DIAGNOSIS — Z91013 Allergy to seafood: Secondary | ICD-10-CM | POA: Diagnosis not present

## 2018-05-03 DIAGNOSIS — Z7982 Long term (current) use of aspirin: Secondary | ICD-10-CM | POA: Diagnosis not present

## 2018-05-03 DIAGNOSIS — Z794 Long term (current) use of insulin: Secondary | ICD-10-CM | POA: Diagnosis not present

## 2018-05-03 DIAGNOSIS — Z808 Family history of malignant neoplasm of other organs or systems: Secondary | ICD-10-CM | POA: Diagnosis not present

## 2018-05-03 DIAGNOSIS — E860 Dehydration: Secondary | ICD-10-CM | POA: Diagnosis present

## 2018-05-03 DIAGNOSIS — I255 Ischemic cardiomyopathy: Secondary | ICD-10-CM | POA: Diagnosis present

## 2018-05-03 DIAGNOSIS — I5022 Chronic systolic (congestive) heart failure: Secondary | ICD-10-CM | POA: Diagnosis present

## 2018-05-03 DIAGNOSIS — Z809 Family history of malignant neoplasm, unspecified: Secondary | ICD-10-CM | POA: Diagnosis not present

## 2018-05-03 DIAGNOSIS — K3184 Gastroparesis: Secondary | ICD-10-CM | POA: Diagnosis present

## 2018-05-03 DIAGNOSIS — Z6839 Body mass index (BMI) 39.0-39.9, adult: Secondary | ICD-10-CM | POA: Diagnosis not present

## 2018-05-03 DIAGNOSIS — N179 Acute kidney failure, unspecified: Secondary | ICD-10-CM | POA: Diagnosis present

## 2018-05-03 DIAGNOSIS — D259 Leiomyoma of uterus, unspecified: Secondary | ICD-10-CM | POA: Diagnosis present

## 2018-05-03 DIAGNOSIS — I130 Hypertensive heart and chronic kidney disease with heart failure and stage 1 through stage 4 chronic kidney disease, or unspecified chronic kidney disease: Secondary | ICD-10-CM | POA: Diagnosis present

## 2018-05-03 DIAGNOSIS — E559 Vitamin D deficiency, unspecified: Secondary | ICD-10-CM | POA: Diagnosis present

## 2018-05-03 DIAGNOSIS — Z87891 Personal history of nicotine dependence: Secondary | ICD-10-CM | POA: Diagnosis not present

## 2018-05-03 DIAGNOSIS — E1143 Type 2 diabetes mellitus with diabetic autonomic (poly)neuropathy: Secondary | ICD-10-CM | POA: Diagnosis present

## 2018-05-03 DIAGNOSIS — E785 Hyperlipidemia, unspecified: Secondary | ICD-10-CM | POA: Diagnosis present

## 2018-05-03 DIAGNOSIS — F329 Major depressive disorder, single episode, unspecified: Secondary | ICD-10-CM | POA: Diagnosis present

## 2018-05-03 DIAGNOSIS — I2510 Atherosclerotic heart disease of native coronary artery without angina pectoris: Secondary | ICD-10-CM | POA: Diagnosis present

## 2018-05-03 LAB — URINALYSIS, COMPLETE (UACMP) WITH MICROSCOPIC
Bacteria, UA: NONE SEEN
Bilirubin Urine: NEGATIVE
Glucose, UA: NEGATIVE mg/dL
Ketones, ur: NEGATIVE mg/dL
Nitrite: NEGATIVE
Protein, ur: 100 mg/dL — AB
Specific Gravity, Urine: 1.016 (ref 1.005–1.030)
Squamous Epithelial / LPF: NONE SEEN (ref 0–5)
WBC, UA: >50 WBC/hpf — ABNORMAL HIGH (ref 0–5)
pH: 5 (ref 5.0–8.0)

## 2018-05-03 LAB — GLUCOSE, CAPILLARY
Glucose-Capillary: 95 mg/dL (ref 65–99)
Glucose-Capillary: 148 mg/dL — ABNORMAL HIGH (ref 65–99)
Glucose-Capillary: 107 mg/dL — ABNORMAL HIGH (ref 65–99)
Glucose-Capillary: 153 mg/dL — ABNORMAL HIGH (ref 65–99)

## 2018-05-03 LAB — URINE DRUG SCREEN, QUALITATIVE (ARMC ONLY)
Amphetamines, Ur Screen: NOT DETECTED
Barbiturates, Ur Screen: POSITIVE — AB
Benzodiazepine, Ur Scrn: NOT DETECTED
Cannabinoid 50 Ng, Ur ~~LOC~~: NOT DETECTED
Cocaine Metabolite,Ur ~~LOC~~: NOT DETECTED
MDMA (Ecstasy)Ur Screen: NOT DETECTED
Methadone Scn, Ur: NOT DETECTED
Opiate, Ur Screen: POSITIVE — AB
Phencyclidine (PCP) Ur S: NOT DETECTED
Tricyclic, Ur Screen: POSITIVE — AB

## 2018-05-03 MED ORDER — PREMIER PROTEIN SHAKE
11.0000 [oz_av] | Freq: Two times a day (BID) | ORAL | Status: DC
  Administered 2018-05-03: 11 [oz_av] via ORAL

## 2018-05-03 NOTE — Progress Notes (Signed)
Patient ID: [name] [name], female   DOB: 17-Mar-1981, 37 y.o.   MRN: [id number]  [employer] PROGRESS NOTE  [name] [name] [id number]:[id number] DOB: 08-21-1981 DOA: 05/02/2018 PCP: Kathrine Haddock, NP  HPI/Subjective: Patient able to tolerate a little bit of the liquids.  Still not hungry.  Still had a little nausea vomiting.  Still having abdominal pain.  Currently not having abdominal pain at this point and feeling a little bit better.  Objective: Vitals:   05/02/18 2351 05/03/18 0820  BP: (!) 143/74 117/67  Pulse: 82 (!) 47  Resp: 18 18  Temp: 98.2 F (36.8 C) 97.9 F (36.6 C)  SpO2: 98% 97%    ROS: Review of Systems  Constitutional: Negative for chills and fever.  Eyes: Negative for blurred vision.  Respiratory: Negative for cough and shortness of breath.   Cardiovascular: Negative for chest pain.  Gastrointestinal: Positive for abdominal pain, nausea and vomiting. Negative for constipation and diarrhea.  Genitourinary: Negative for dysuria.  Musculoskeletal: Negative for joint pain.  Neurological: Negative for dizziness and headaches.   Exam: Physical Exam  Constitutional: She is oriented to person, place, and time.  HENT:  Nose: No mucosal edema.  Mouth/Throat: No oropharyngeal exudate or posterior oropharyngeal edema.  Eyes: Pupils are equal, round, and reactive to light. Conjunctivae, EOM and lids are normal.  Neck: No JVD present. Carotid bruit is not present. No edema present. No thyroid mass and no thyromegaly present.  Cardiovascular: S1 normal and S2 normal. Exam reveals no gallop.  No murmur heard. Pulses:      Dorsalis pedis pulses are 2+ on the right side, and 2+ on the left side.  Respiratory: No respiratory distress. She has no wheezes. She has no rhonchi. She has no rales.  GI: Soft. Bowel sounds are normal. There is tenderness in the epigastric area.  Musculoskeletal:       Right ankle: She exhibits no swelling.       Left ankle: She exhibits no  swelling.  Lymphadenopathy:    She has no cervical adenopathy.  Neurological: She is alert and oriented to person, place, and time. No cranial nerve deficit.  Skin: Skin is warm. No rash noted. Nails show no clubbing.  Psychiatric: She has a normal mood and affect.      Data Reviewed: Basic Metabolic Panel: Recent Labs  Lab 04/29/18 1850 05/01/18 2044  NA 140 140  K 4.5 3.8  CL 111 110  CO2 19* 18*  GLUCOSE 181* 187*  BUN 33* 31*  CREATININE 1.57* 1.53*  CALCIUM 9.2 9.4  MG  --  1.9  PHOS  --  4.6   Liver Function Tests: Recent Labs  Lab 04/29/18 1850 05/01/18 2044  AST 17 16  ALT 14 13*  ALKPHOS 114 112  BILITOT 0.4 0.6  PROT 7.9 7.9  ALBUMIN 3.2* 3.4*   Recent Labs  Lab 04/29/18 1850 05/01/18 2044  LIPASE 19 17   CBC: Recent Labs  Lab 04/29/18 1850 05/01/18 2044  WBC 8.9 10.0  HGB 11.9* 11.7*  HCT 35.3 35.2  MCV 79.0* 78.6*  PLT 432 440   BNP (last 3 results) Recent Labs    10/08/17 0448  BNP 34.0    CBG: Recent Labs  Lab 05/02/18 1223 05/02/18 1656 05/02/18 2104 05/03/18 0817 05/03/18 1146  GLUCAP 107* 136* 125* 95 148*    Scheduled Meds: . amitriptyline  30 mg Oral QHS  . aspirin EC  81 mg Oral Daily  . dicyclomine  20 mg  Oral Q6H  . enoxaparin (LOVENOX) injection  40 mg Subcutaneous Q24H  . erythromycin ethylsuccinate  200 mg Oral TID AC  . insulin aspart  0-15 Units Subcutaneous TID WC  . insulin glargine  40 Units Subcutaneous QHS  . isosorbide mononitrate  30 mg Oral Daily  . metoCLOPramide (REGLAN) injection  10 mg Intravenous Q6H  . metoprolol tartrate  50 mg Oral BID  . pantoprazole  40 mg Oral Daily  . protein supplement shake  11 oz Oral BID BM  . rosuvastatin  5 mg Oral Daily  . sucralfate  1 g Oral BID  . venlafaxine XR  150 mg Oral Q breakfast   Continuous Infusions: . sodium chloride 50 mL/hr at 05/03/18 1052    Assessment/Plan:  1. Nausea vomiting and diarrhea.   this is diabetic gastroparesis.   Continue supportive care with gentle IV fluid hydration, PRN nausea medications, PRN pain medications.  Send off stool studies if any further diarrhea.  Patient on Reglan, Protonix.  Advance diet to full liquids.  I will see how she is tomorrow morning on whether we can advance to solid food or continue with the liquids. 2. Acute kidney injury gentle IV fluid hydration 3. Essential hypertension, history of CAD on aspirin metoprolol and Crestor 4. Hyperlipidemia unspecified on Crestor 5. Morbid obesity.  Weight loss needed 6. Type 2 diabetes mellitus on glargine insulin and sliding scale  Code Status:     Code Status Orders  (From admission, onward)        Start     Ordered   05/02/18 0456  Full code  Continuous     05/02/18 0455    Code Status History    Date Active Date Inactive Code Status Order ID Comments User Context   01/22/2018 0103 01/24/2018 1711 Full Code [id number]  Phillips Grout, MD ED   08/03/2017 2031 08/04/2017 2006 Full Code [id number]  Dustin Flock, MD Inpatient   08/05/2016 0832 08/08/2016 2008 Full Code [id number]  Clayburn Pert, MD Inpatient   03/01/2016 0612 03/01/2016 1954 Full Code [id number]  Harrie Foreman, MD Inpatient   08/21/2015 1736 08/22/2015 2203 Full Code [id number]  Dustin Flock, MD ED   07/22/2015 1105 07/23/2015 1832 Full Code [id number]  Epifanio Lesches, MD Inpatient   05/05/2015 1059 05/05/2015 2055 Full Code [id number]  Martinique, Peter M, MD Inpatient   05/05/2015 [id number] 05/05/2015 1059 Full Code [id number]  Rise Patience, MD Inpatient     Family Communication: Family at bedside Disposition Plan: Must be able to tolerate solid food prior to disposition  Time spent: 28 minutes  [place]

## 2018-05-03 NOTE — Progress Notes (Signed)
Assessment done. Pt appears to be sleeping in bed, but arouses easily to name. No apparent distress. Call bell in reach.

## 2018-05-03 NOTE — Progress Notes (Signed)
Awakened easily for hs meds. No c/o offered. No apparent distress. Family member at bedside to stay the night. Call bell in reach.

## 2018-05-03 NOTE — Progress Notes (Signed)
Awake in bed, assessment done. Pt stated she felt better today. Call bell in reach, instructed to call for needs.

## 2018-05-03 NOTE — Progress Notes (Signed)
Initial Nutrition Assessment  DOCUMENTATION CODES:   Obesity unspecified  INTERVENTION:  Provide Premier Protein po BID, each supplement provides 160 kcal and 30 grams of protein.  Encouraged intake of small, frequent meals throughout the day in setting of gastroparesis. Encouraged patient to choose a lean protein source at each meal. Discussed that meals that are lower in fat and fiber are easier to digest and may be better tolerated.  NUTRITION DIAGNOSIS:   Inadequate oral intake related to nausea, vomiting, diarrhea as evidenced by per patient/family report.  GOAL:   Patient will meet greater than or equal to 90% of their needs  MONITOR:   PO intake, Supplement acceptance, Diet advancement, Labs, Weight trends, I & O's  REASON FOR ASSESSMENT:   Malnutrition Screening Tool    ASSESSMENT:   37 year old female with PMHx of CHF, ischemic cardiomyopathy, tobacco abuse, HLD, iron deficiency anemia, HTN, hx MI 03/29/2015, renal insufficiency, DM type 2, vitamin D deficiency, diverticulosis who is admitted with N/V and diarrhea possibly from diabetic gastroparesis, AKI.   Met with patient and her mother at bedside. Patient reports she has had a poor appetite since 5/21. She reports PTA she developed N/V and diarrhea. Her diarrhea has now improved but she continues to have N/V. She was given a full liquid tray today, but had not had any at time of RD visit. She did not have a menu in room so she was not even sure what was available on full liquid diet. RD called for menu to be delivered to room. She reports she has onset of N/V from gastroparesis frequently. When her symptoms come on she cannot tolerate any food or liquids (including water) and will go 1-3 days without any intake. She will then have a few days where she can tolerate a fairly normal diet before symptoms come back. She has not previously learned about eating with gastroparesis, but has looked it up online before. Patient is  amenable to trying Premier Protein. She can tolerate a small amount of lactose usually. She has allergies to shellfish and rosemary (already entered in chart).  Patient reports her UBW had been around 250 lbs, but lately she has been closer to 235 lbs. RD obtained bed scale weight of 107.6 kg (235.2 lbs). Weight in chart appears stable for the past year.  Medications reviewed and include: erythromycin ethylsuccinate 200 mg TID before meals, Novolog 0-15 units TID, Lantus 40 units QHS, Reglan 10 mg Q6hrs IV, pantoprazole, Carafate 1 gram BID, NS @ 50 mL/hr.  Labs reviewed: CBG 95-156 past 24 hrs. Most recent HgbA1c was 11.5 on 01/22/2018.  Patient does not meet criteria for malnutrition at this time.  NUTRITION - FOCUSED PHYSICAL EXAM:    Most Recent Value  Orbital Region  No depletion  Upper Arm Region  No depletion  Thoracic and Lumbar Region  No depletion  Buccal Region  No depletion  Temple Region  No depletion  Clavicle Bone Region  No depletion  Clavicle and Acromion Bone Region  No depletion  Scapular Bone Region  No depletion  Dorsal Hand  No depletion  Patellar Region  No depletion  Anterior Thigh Region  No depletion  Posterior Calf Region  No depletion  Edema (RD Assessment)  None  Hair  Reviewed  Eyes  Reviewed  Mouth  Reviewed  Skin  Reviewed  Nails  Reviewed     Diet Order:   Diet Order           Diet  full liquid Room service appropriate? Yes; Fluid consistency: Thin  Diet effective now          EDUCATION NEEDS:   Education needs have been addressed  Skin:  Skin Assessment: Reviewed RN Assessment  Last BM:  PTA (05/01/2018)  Height:   Ht Readings from Last 1 Encounters:  04/29/18 5' 5" (1.651 m)    Weight:   Wt Readings from Last 1 Encounters:  05/03/18 235 lb 3.7 oz (106.7 kg)    Ideal Body Weight:  56.8 kg  BMI:  Body mass index is 39.14 kg/m.  Estimated Nutritional Needs:   Kcal:  1925-2100 (MSJ x 1.1-1.2)  Protein:  95-105 grams  (0.9-1 grams/kg)  Fluid:  1.9-2.1 L/day (1 mL/kcal)  Willey Blade, MS, RD, LDN Office: [phone] Pager: [phone] After Hours/Weekend Pager: [phone]

## 2018-05-03 NOTE — Progress Notes (Signed)
Sleeping on rounds. resp easy, no distress noted. Call bell in reach, mom at bedside.

## 2018-05-03 NOTE — Progress Notes (Signed)
Pt slept in long intervals this shift. Mother at bedside. No apparent distress. Call bell in reach.

## 2018-05-03 NOTE — Progress Notes (Signed)
Pt called c/o abd pain. Medicated per mar. Also have small amt of emesis.

## 2018-05-04 LAB — GLUCOSE, CAPILLARY: Glucose-Capillary: 152 mg/dL — ABNORMAL HIGH (ref 65–99)

## 2018-05-04 LAB — BASIC METABOLIC PANEL
Anion gap: 5 (ref 5–15)
BUN: 24 mg/dL — ABNORMAL HIGH (ref 6–20)
CO2: 21 mmol/L — ABNORMAL LOW (ref 22–32)
Calcium: 8 mg/dL — ABNORMAL LOW (ref 8.9–10.3)
Chloride: 111 mmol/L (ref 101–111)
Creatinine, Ser: 1.41 mg/dL — ABNORMAL HIGH (ref 0.44–1.00)
GFR calc Af Amer: 54 mL/min — ABNORMAL LOW (ref >60)
GFR calc non Af Amer: 47 mL/min — ABNORMAL LOW (ref >60)
Glucose, Bld: 139 mg/dL — ABNORMAL HIGH (ref 65–99)
Potassium: 3.3 mmol/L — ABNORMAL LOW (ref 3.5–5.1)
Sodium: 137 mmol/L (ref 135–145)

## 2018-05-04 MED ORDER — POLYETHYLENE GLYCOL 3350 17 G PO PACK: 17.0000 g | PACK | Freq: Every day | ORAL | Status: DC

## 2018-05-04 MED ORDER — INSULIN GLARGINE 100 UNIT/ML ~~LOC~~ SOLN: 40.0000 [IU] | Freq: Every day | SUBCUTANEOUS | 11 refills | Status: DC

## 2018-05-04 MED ORDER — FERROUS SULFATE 325 (65 FE) MG PO TABS: 325.0000 mg | ORAL_TABLET | Freq: Every day | ORAL | Status: DC

## 2018-05-04 MED ORDER — METOCLOPRAMIDE HCL 10 MG PO TABS: 10.0000 mg | ORAL_TABLET | Freq: Three times a day (TID) | ORAL | 0 refills | Status: DC

## 2018-05-04 MED ORDER — INSULIN ASPART 100 UNIT/ML ~~LOC~~ SOLN: 10.0000 [IU] | Freq: Three times a day (TID) | SUBCUTANEOUS | 11 refills | Status: AC

## 2018-05-04 MED ORDER — AMITRIPTYLINE HCL 10 MG PO TABS: 30.0000 mg | ORAL_TABLET | Freq: Every day | ORAL | Status: DC

## 2018-05-04 MED ORDER — POLYETHYLENE GLYCOL 3350 17 G PO PACK
17.0000 g | PACK | Freq: Every day | ORAL | 0 refills | Status: DC
Start: 2018-05-05 — End: 2018-12-03

## 2018-05-04 MED ORDER — PREMIER PROTEIN SHAKE: 11.0000 [oz_av] | Freq: Two times a day (BID) | ORAL | 0 refills | Status: DC

## 2018-05-04 NOTE — Progress Notes (Signed)
Arouses easily to name for am meds. Uneventful night. Up to br x2 to void this shift; no BM. Slept in intervals, family memberat bedside through the night. Call bell in reach.

## 2018-05-04 NOTE — Discharge Summary (Signed)
[facility] [place] NAME: [name] [name]    MR#:  [id number]  DATE OF BIRTH:  1981/10/14  DATE OF ADMISSION:  05/02/2018 ADMITTING PHYSICIAN: Arta Silence, MD  DATE OF DISCHARGE: 05/04/2018 11:28 AM  PRIMARY CARE PHYSICIAN: Kathrine Haddock, NP    ADMISSION DIAGNOSIS:  Gastroparesis [K31.84] Dehydration [E86.0] Starvation, subsequent encounter [T73.0XXD]  DISCHARGE DIAGNOSIS:  Active Problems:   Gastroparesis   Diabetic gastroparesis associated with type 2 diabetes mellitus ([place])   SECONDARY DIAGNOSIS:   Past Medical History:  Diagnosis Date  . Chronic systolic CHF (congestive heart failure) ([place])    a. echo 03/2015: EF 30-35%, mild concentric LVH, severe HK of inf, inflat, & lat walls, mod MR, mild TR;  b. 04/2015 LV Gram: EF 55-65%.  . Diabetes type 2, controlled ([place])    a. since 2002   . Diverticulosis, sigmoid 07/2016  . Heavy menses    a. H/O IUD - expired in 2014 - remains in place.  . Hyperlipidemia   . Hypertension   . Iron deficiency anemia   . Ischemic cardiomyopathy    a. 03/2015 EF 30-35% post NSTEMI;  b. 04/2015 EF 55-65% on LV gram.  . Kidney disease   . MI (myocardial infarction) ([place]) 03/29/2015  . Obesity   . Renal insufficiency   . Tobacco abuse    a. quit 03/2015.  Marland Kitchen Uterine fibroid   . Vertigo   . Vitamin D deficiency     HOSPITAL COURSE:   1.  Diabetic gastroparesis.  Patient presented with nausea, vomiting and diarrhea.  The patient was given supportive care with IV fluid hydration, PRN nausea and pain medications the patient did not have any further diarrhea while here in the hospital.  The patient was given Reglan IV during the hospital course.  The patient was advanced slowly from liquid diet to full liquid diet to solid food.  On the day of discharge the patient tolerated solid food and was discharged home. 2.  Acute kidney injury on chronic kidney disease stage III.  Continue to monitor as  outpatient. 3.  Essential hypertension, history of coronary artery disease on aspirin metoprolol and Crestor 4.  Hyperlipidemia unspecified on Crestor 5.  Morbid obesity.  Weight loss needed 6.  Type 2 diabetes mellitus on glargine insulin and sliding scale while here in the hospital.  DISCHARGE CONDITIONS:   Satisfactory  CONSULTS OBTAINED:  Treatment Team:  Arta Silence, MD  DRUG ALLERGIES:   Allergies  Allergen Reactions  . Lisinopril Anaphylaxis, Itching and Swelling  . Rosemary Oil Anaphylaxis  . Shellfish Allergy Itching and Swelling  . Metformin And Related Diarrhea, Nausea And Vomiting and Rash    DISCHARGE MEDICATIONS:   Allergies as of 05/04/2018      Reactions   Lisinopril Anaphylaxis, Itching, Swelling   Rosemary Oil Anaphylaxis   Shellfish Allergy Itching, Swelling   Metformin And Related Diarrhea, Nausea And Vomiting, Rash      Medication List    STOP taking these medications   glimepiride 4 MG tablet Commonly known as:  AMARYL   Insulin Glargine 100 UNIT/ML Solostar Pen Commonly known as:  LANTUS SOLOSTAR Replaced by:  insulin glargine 100 UNIT/ML injection   loperamide 2 MG capsule Commonly known as:  IMODIUM   NOVOLOG FLEXPEN 100 UNIT/ML FlexPen Generic drug:  insulin aspart Replaced by:  insulin aspart 100 UNIT/ML injection     TAKE these medications   albuterol 108 (90 Base) MCG/ACT inhaler Commonly  known as:  PROVENTIL HFA;VENTOLIN HFA Inhale 2 puffs into the lungs every 6 (six) hours as needed for wheezing or shortness of breath.   amitriptyline 10 MG tablet Commonly known as:  ELAVIL Take 3 tablets (30 mg total) by mouth at bedtime. Take 2 tablets at bedtime. Total of 20mg  nightly What changed:  how much to take   aspirin EC 81 MG tablet Take 81 mg by mouth daily.   benzonatate 100 MG capsule Commonly known as:  TESSALON Take 1 capsule (100 mg total) by mouth 2 (two) times daily as needed for cough.   Cholecalciferol  2000 units Tabs Take 1 tablet by mouth daily.   dicyclomine 20 MG tablet Commonly known as:  BENTYL Take 1 tablet (20 mg total) by mouth every 6 (six) hours.   ferrous sulfate 325 (65 FE) MG tablet Take 1 tablet (325 mg total) by mouth daily. What changed:  when to take this   ACCU-CHEK AVIVA test strip Generic drug:  glucose blood 1 each by Other route as needed for other. Use as instructed   glucose blood test strip Commonly known as:  ACCU-CHEK ACTIVE STRIPS Use as instructed   insulin aspart 100 UNIT/ML injection Commonly known as:  novoLOG Inject 10 Units into the skin 3 (three) times daily with meals. Replaces:  NOVOLOG FLEXPEN 100 UNIT/ML FlexPen   insulin glargine 100 UNIT/ML injection Commonly known as:  LANTUS Inject 0.4 mLs (40 Units total) into the skin at bedtime. Replaces:  Insulin Glargine 100 UNIT/ML Solostar Pen   Insulin Pen Needle 32G X 4 MM Misc 1 Units by Does not apply route every morning. Pen needles   isosorbide mononitrate 30 MG 24 hr tablet Commonly known as:  IMDUR TAKE 1 TABLET BY MOUTH ONCE DAILY   meclizine 32 MG tablet Commonly known as:  ANTIVERT Take 1 tablet (32 mg total) by mouth 3 (three) times daily as needed. What changed:  reasons to take this   metoCLOPramide 10 MG tablet Commonly known as:  REGLAN Take 1 tablet (10 mg total) by mouth 4 (four) times daily -  before meals and at bedtime. What changed:  when to take this   metoprolol tartrate 50 MG tablet Commonly known as:  LOPRESSOR Take 1 tablet (50 mg total) by mouth 2 (two) times daily. Need office visit   mupirocin ointment 2 % Commonly known as:  BACTROBAN Place 1 application into the nose 2 (two) times daily.   NEXPLANON 68 MG Impl implant Generic drug:  etonogestrel 1 each by Subdermal route once.   nitroGLYCERIN 0.4 MG SL tablet Commonly known as:  NITROSTAT Place 1 tablet (0.4 mg total) under the tongue every 5 (five) minutes as needed for chest pain.  Reported on 02/20/2016   pantoprazole 40 MG tablet Commonly known as:  PROTONIX TAKE ONE TABLET BY MOUTH ONCE DAILY AT 6 AM.   polyethylene glycol packet Commonly known as:  MIRALAX / GLYCOLAX Take 17 g by mouth daily. Start taking on:  05/05/2018   prochlorperazine 10 MG tablet Commonly known as:  COMPAZINE Take 1 tablet (10 mg total) by mouth every 6 (six) hours as needed for nausea or vomiting.   promethazine 25 MG tablet Commonly known as:  PHENERGAN Take 1 tablet (25 mg total) by mouth every 8 (eight) hours as needed for nausea or vomiting.   protein supplement shake Liqd Commonly known as:  PREMIER PROTEIN Take 325 mLs (11 oz total) by mouth 2 (two) times daily between  meals.   rosuvastatin 5 MG tablet Commonly known as:  CRESTOR TAKE ONE TABLET BY MOUTH ONCE DAILY   sucralfate 1 g tablet Commonly known as:  CARAFATE Take 1 tablet (1 g total) by mouth 2 (two) times daily.   venlafaxine XR 150 MG 24 hr capsule Commonly known as:  EFFEXOR-XR Take 150 mg by mouth daily with breakfast.   Vitamin D (Ergocalciferol) 50000 units Caps capsule Commonly known as:  DRISDOL Take 1 capsule (50,000 Units total) by mouth every 7 (seven) days.        DISCHARGE INSTRUCTIONS:   Follow-up PMD 6 days  If you experience worsening of your admission symptoms, develop shortness of breath, life threatening emergency, suicidal or homicidal thoughts you must seek medical attention immediately by calling 911 or calling your MD immediately  if symptoms less severe.  You Must read complete instructions/literature along with all the possible adverse reactions/side effects for all the Medicines you take and that have been prescribed to you. Take any new Medicines after you have completely understood and accept all the possible adverse reactions/side effects.   Please note  You were cared for by a hospitalist during your hospital stay. If you have any questions about your discharge  medications or the care you received while you were in the hospital after you are discharged, you can call the unit and asked to speak with the hospitalist on call if the hospitalist that took care of you is not available. Once you are discharged, your primary care physician will handle any further medical issues. Please note that NO REFILLS for any discharge medications will be authorized once you are discharged, as it is imperative that you return to your primary care physician (or establish a relationship with a primary care physician if you do not have one) for your aftercare needs so that they can reassess your need for medications and monitor your lab values.    Today   CHIEF COMPLAINT:   Chief Complaint  Patient presents with  . Abdominal Pain  . Chest Pain    HISTORY OF PRESENT ILLNESS:  Keyerra Lamere  is a 37 y.o. female came in with abdominal pain and chest pain   VITAL SIGNS:  Blood pressure (!) 150/81, pulse 82, temperature 98.5 F (36.9 C), temperature source Oral, resp. rate 17, height 5\' 5"  (1.651 m), weight 106.6 kg (235 lb), SpO2 100 %.  PHYSICAL EXAMINATION:  GENERAL:  37 y.o.-year-old patient lying in the bed with no acute distress.  EYES: Pupils equal, round, reactive to light and accommodation. No scleral icterus. Extraocular muscles intact.  HEENT: Head atraumatic, normocephalic. Oropharynx and nasopharynx clear.  NECK:  Supple, no jugular venous distention. No thyroid enlargement, no tenderness.  LUNGS: Normal breath sounds bilaterally, no wheezing, rales,rhonchi or crepitation. No use of accessory muscles of respiration.  CARDIOVASCULAR: S1, S2 normal. No murmurs, rubs, or gallops.  ABDOMEN: Soft, slight epigastric tenderness, non-distended. Bowel sounds present. No organomegaly or mass.  EXTREMITIES: No pedal edema, cyanosis, or clubbing.  NEUROLOGIC: Cranial nerves II through XII are intact. Muscle strength 5/5 in all extremities. Sensation intact. Gait not  checked.  PSYCHIATRIC: The patient is alert and oriented x 3.  SKIN: No obvious rash, lesion, or ulcer.   DATA REVIEW:   CBC Recent Labs  Lab 05/01/18 2044  WBC 10.0  HGB 11.7*  HCT 35.2  PLT 440    Chemistries  Recent Labs  Lab 05/01/18 2044 05/04/18 0445  NA 140 137  K  3.8 3.3*  CL 110 111  CO2 18* 21*  GLUCOSE 187* 139*  BUN 31* 24*  CREATININE 1.53* 1.41*  CALCIUM 9.4 8.0*  MG 1.9  --   AST 16  --   ALT 13*  --   ALKPHOS 112  --   BILITOT 0.6  --       Management plans discussed with the patient, family and they are in agreement.  CODE STATUS:     Code Status Orders  (From admission, onward)        Start     Ordered   05/02/18 0456  Full code  Continuous     05/02/18 0455    Code Status History    Date Active Date Inactive Code Status Order ID Comments User Context   01/22/2018 0103 01/24/2018 1711 Full Code [id number]  Phillips Grout, MD ED   08/03/2017 2031 08/04/2017 2006 Full Code [id number]  Dustin Flock, MD Inpatient   08/05/2016 0832 08/08/2016 2008 Full Code [id number]  Clayburn Pert, MD Inpatient   03/01/2016 0612 03/01/2016 1954 Full Code [id number]  Harrie Foreman, MD Inpatient   08/21/2015 1736 08/22/2015 2203 Full Code [id number]  Dustin Flock, MD ED   07/22/2015 1105 07/23/2015 1832 Full Code [id number]  Epifanio Lesches, MD Inpatient   05/05/2015 1059 05/05/2015 2055 Full Code [id number]  Martin[place], Peter M, MD Inpatient   05/05/2015 [phone] 05/05/2015 1059 Full Code [id number]  Rise Patience, MD Inpatient      TOTAL TIME TAKING CARE OF THIS PATIENT: 35 minutes.    Loletha Grayer M.D on 05/04/2018 at 12:55 PM  Between 7am to 6pm - Pager - [phone]  After 6pm go to [email or web address] - password [email or web address]  [facility] Office  [phone]  CC: Primary care physician; Kathrine Haddock, NP

## 2018-05-04 NOTE — Progress Notes (Signed)
Discharge instructions and prescriptions given to pt. IV removed. No questions from pt. Pr dressed and ready for discharge home with husband.

## 2018-05-06 ENCOUNTER — Telehealth: Payer: Self-pay

## 2018-05-06 NOTE — Telephone Encounter (Signed)
I have made the 1st attempt to contact the patient or family member in charge, in order to follow up from recently being discharged from the hospital. I left a message on voicemail but I will make another attempt at a different time.  

## 2018-05-07 NOTE — Telephone Encounter (Signed)
I have made the 2nd attempt to contact the patient or family member in charge, in order to follow up from recently being discharged from the hospital. I left a message on voicemail but I will make another attempt at a different time.  

## 2018-05-13 ENCOUNTER — Other Ambulatory Visit: Payer: Self-pay | Admitting: Gastroenterology

## 2018-06-05 ENCOUNTER — Encounter: Payer: Self-pay | Admitting: Unknown Physician Specialty

## 2018-06-05 MED ORDER — METOCLOPRAMIDE HCL 10 MG PO TABS: 10.0000 mg | ORAL_TABLET | Freq: Three times a day (TID) | ORAL | 0 refills | Status: DC

## 2018-06-05 MED ORDER — VENLAFAXINE HCL ER 150 MG PO CP24: 150.0000 mg | ORAL_CAPSULE | Freq: Every day | ORAL | 0 refills | Status: DC

## 2018-06-05 MED ORDER — DICYCLOMINE HCL 20 MG PO TABS: 20.0000 mg | ORAL_TABLET | Freq: Four times a day (QID) | ORAL | 2 refills | Status: DC

## 2018-06-09 ENCOUNTER — Ambulatory Visit: Payer: Medicaid Other | Admitting: Nurse Practitioner

## 2018-06-09 ENCOUNTER — Encounter: Payer: Self-pay | Admitting: Nurse Practitioner

## 2018-06-09 VITALS — BP 104/62 | HR 79 | Ht 65.0 in | Wt 239.0 lb

## 2018-06-09 DIAGNOSIS — I10 Essential (primary) hypertension: Secondary | ICD-10-CM

## 2018-06-09 DIAGNOSIS — E1122 Type 2 diabetes mellitus with diabetic chronic kidney disease: Secondary | ICD-10-CM | POA: Diagnosis not present

## 2018-06-09 DIAGNOSIS — Z794 Long term (current) use of insulin: Secondary | ICD-10-CM | POA: Diagnosis not present

## 2018-06-09 DIAGNOSIS — N183 Chronic kidney disease, stage 3 unspecified: Secondary | ICD-10-CM

## 2018-06-09 DIAGNOSIS — E782 Mixed hyperlipidemia: Secondary | ICD-10-CM

## 2018-06-09 DIAGNOSIS — I25118 Atherosclerotic heart disease of native coronary artery with other forms of angina pectoris: Secondary | ICD-10-CM | POA: Diagnosis not present

## 2018-06-09 MED ORDER — NITROGLYCERIN 0.4 MG SL SUBL: SUBLINGUAL_TABLET | SUBLINGUAL | 3 refills | Status: DC

## 2018-06-09 MED ORDER — METOPROLOL TARTRATE 50 MG PO TABS: 50.0000 mg | ORAL_TABLET | Freq: Two times a day (BID) | ORAL | 3 refills | Status: DC

## 2018-06-09 NOTE — Progress Notes (Signed)
Office Visit    Patient Name: [name] [name] Date of Encounter: 06/09/2018  Primary Care Provider:  Kathrine Haddock, NP Primary Cardiologist:  Kathlyn Sacramento, MD  Chief Complaint    37 year old female with a history of CAD status post prior circumflex and obtuse marginal stenting, ischemic cardiomyopathy with normalized LV function, type 2 diabetes mellitus, stage II-III chronic kidney disease, diabetic gastroparesis, hypertension, and hyperlipidemia, who presents for follow-up.  Past Medical History    Past Medical History:  Diagnosis Date  . CAD (coronary artery disease)    a. 03/2015 NSTEMI/PCI: LCX 164m (DES), OM1 100p (DES - vessel labeled Ramus in subsequent cath report). Minor irregs to LAD/RCA; b. 04/2015 Cath: LM nl, LAD 40p, RI patent stent, LCX patent stent, RCA nl, EF 55-65%; c. 02/2016 MV: basal inferolateral and mid inferolateral defect/infarct w/o ischemia. EF 55-65%. Low risk.  . Chronic systolic CHF (congestive heart failure) ([place])    a. 03/2015 Echo: EF 30-35%, mild concentric LVH, severe HK of inf, inflat, & lat walls, mod MR, mild TR;  b. 04/2015 LV Gram: EF 55-65%; c. 09/2017 Echo: EF 55-60%, no rwma. Mild MR.  . CKD (chronic kidney disease), stage III ([place])   . Diabetes type 2, controlled ([place])    a. since 2002   . Diabetic gastroparesis ([facility])    a. chronic nausea/vomiting.  . Diverticulosis, sigmoid 07/2016  . Heavy menses    a. H/O IUD - expired in 2014 - remains in place.  . Hyperlipidemia   . Hypertension   . Iron deficiency anemia   . Ischemic cardiomyopathy (resolved)    a. 03/2015 EF 30-35% post NSTEMI;  b. 04/2015 EF 55-65% on LV gram; c. 09/2017 Echo: EF 55-60%.  . Obesity   . Tobacco abuse    a. quit 03/2015.  Marland Kitchen Uterine fibroid   . Vertigo   . Vitamin D deficiency    Past Surgical History:  Procedure Laterality Date  . APPENDECTOMY    . CARDIAC CATHETERIZATION  4/16   x2 stent [facility]  . CARDIAC CATHETERIZATION N/A 05/05/2015   Procedure: Left  Heart Cath and Coronary Angiography;  Surgeon: Peter M Martinique, MD;  Location: [place] CV LAB;  Service: Cardiovascular;  Laterality: N/A;  . COLONOSCOPY WITH PROPOFOL N/A 07/31/2016   Procedure: COLONOSCOPY WITH PROPOFOL;  Surgeon: Lollie Sails, MD;  Location[facility] ENDOSCOPY;  Service: Endoscopy;  Laterality: N/A;  . ESOPHAGOGASTRODUODENOSCOPY (EGD) WITH PROPOFOL N/A 07/31/2016   Procedure: ESOPHAGOGASTRODUODENOSCOPY (EGD) WITH PROPOFOL;  Surgeon: Lollie Sails, MD;  Location: [facility] ENDOSCOPY;  Service: Endoscopy;  Laterality: N/A;  . LAPAROSCOPIC APPENDECTOMY N/A 08/07/2016   Procedure: APPENDECTOMY LAPAROSCOPIC;  Surgeon: Hubbard Robinson, MD;  Location: [facility] ORS;  Service: General;  Laterality: N/A;  . MOUTH SURGERY      Allergies  Allergies  Allergen Reactions  . Lisinopril Anaphylaxis, Itching and Swelling  . Rosemary Oil Anaphylaxis  . Shellfish Allergy Itching and Swelling  . Metformin And Related Diarrhea, Nausea And Vomiting and Rash    History of Present Illness    37 year old female with the above complex past medical history including type 2 diabetes mellitus which was diagnosed in 2002, diabetic gastroparesis with chronic nausea and vomiting, coronary artery disease, ischemic cardiomyopathy with normalization of LV function, stage II-III chronic kidney disease, hypertension, hyperlipidemia, and obesity.  Cardiac history dates back to April 2016, when she was hospitalized with chest pain and non-STEMI.  She was found to have LV dysfunction with an EF of  30 to 35% and underwent diagnostic catheterization revealing severe circumflex and OM1 disease.  Both areas were successfully treated with a drug-eluting stent.  Follow-up catheterization in May 2016 revealed patency of both stents with normalization of LV function by ventriculography, with an EF of 55 to 65%.  Of note, catheterization report from May 2016 refers to the obtuse marginal stent as a ramus intermedius stent.   Stress testing was performed in March 2017, which was nonischemic and low risk.  She was last seen in clinic in September 2018, at which time she reported stable intermittent chest discomfort.  This was followed by an echocardiogram which showed normal LV function.  Since her last visit, she has remained stable.  She tells sometimes experiences chest discomfort, up to 2-3 times per week, typically at rest but sometimes with exertion, lasting a few minutes, and resolving spontaneously.  She says this is no different in frequency or severity than what she has a been experiencing over the past few years.  She thinks symptoms have improved since being placed on isosorbide.  She has chronic, stable dyspnea on exertion.  She denies palpitations, PND, orthopnea, dizziness, syncope, edema, or early satiety.  She was hospitalized at [facility] in May in the setting of nausea and vomiting and diabetic gastroparesis.  Home Medications    Prior to Admission medications   Medication Sig Start Date End Date Taking? Authorizing Provider  albuterol (PROVENTIL HFA;VENTOLIN HFA) 108 (90 Base) MCG/ACT inhaler Inhale 2 puffs into the lungs every 6 (six) hours as needed for wheezing or shortness of breath. 02/19/17  Yes Volney American, PA-C  amitriptyline (ELAVIL) 10 MG tablet Take 3 tablets (30 mg total) by mouth at bedtime. Take 2 tablets at bedtime. Total of 20mg  nightly 05/04/18  Yes Wieting, Richard, MD  aspirin EC 81 MG tablet Take 81 mg by mouth daily.   Yes [provider]  benzonatate (TESSALON) 100 MG capsule Take 1 capsule (100 mg total) by mouth 2 (two) times daily as needed for cough. 01/07/18  Yes Kathrine Haddock, NP  Cholecalciferol 2000 units TABS Take 1 tablet by mouth daily.   Yes [provider]  dicyclomine (BENTYL) 20 MG tablet Take 1 tablet (20 mg total) by mouth every 6 (six) hours. 06/05/18  Yes Kathrine Haddock, NP  etonogestrel (NEXPLANON) 68 MG IMPL implant 1 each by  Subdermal route once.   Yes [provider]  ferrous sulfate 325 (65 FE) MG tablet Take 1 tablet (325 mg total) by mouth daily. 05/04/18  Yes Wieting, Richard, MD  glucose blood (ACCU-CHEK ACTIVE STRIPS) test strip Use as instructed 02/11/18  Yes Volney American, PA-C  glucose blood (ACCU-CHEK AVIVA) test strip 1 each by Other route as needed for other. Use as instructed   Yes [provider]  insulin aspart (NOVOLOG) 100 UNIT/ML injection Inject 10 Units into the skin 3 (three) times daily with meals. 05/04/18  Yes Wieting, Richard, MD  insulin glargine (LANTUS) 100 UNIT/ML injection Inject 0.4 mLs (40 Units total) into the skin at bedtime. 05/04/18  Yes Wieting, Richard, MD  Insulin Pen Needle 32G X 4 MM MISC 1 Units by Does not apply route every morning. Pen needles 08/27/17  Yes Kathrine Haddock, NP  isosorbide mononitrate (IMDUR) 30 MG 24 hr tablet TAKE 1 TABLET BY MOUTH ONCE DAILY 10/06/17  Yes Wellington Hampshire, MD  meclizine (ANTIVERT) 32 MG tablet Take 1 tablet (32 mg total) by mouth 3 (three) times daily as needed.  Patient taking differently: Take 32 mg by mouth 3 (three) times daily as needed for dizziness or nausea.  06/21/16  Yes Kathrine Haddock, NP  metoCLOPramide (REGLAN) 10 MG tablet Take 1 tablet (10 mg total) by mouth 4 (four) times daily -  before meals and at bedtime. 06/05/18  Yes Kathrine Haddock, NP  metoprolol tartrate (LOPRESSOR) 50 MG tablet Take 1 tablet (50 mg total) by mouth 2 (two) times daily. Need office visit 01/07/18  Yes [name] A, FNP  mupirocin ointment (BACTROBAN) 2 % Place 1 application into the nose 2 (two) times daily. 01/24/18  Yes Georgette Shell, MD  nitroGLYCERIN (NITROSTAT) 0.4 MG SL tablet Place 1 tablet (0.4 mg total) under the tongue every 5 (five) minutes as needed for chest pain. Reported on 02/20/2016 03/17/17  Yes Hackney, Otila Kluver A, FNP  pantoprazole (PROTONIX) 40 MG tablet TAKE ONE TABLET BY MOUTH ONCE DAILY AT 6 AM. 12/17/17  Yes  Kathrine Haddock, NP  polyethylene glycol (MIRALAX / GLYCOLAX) packet Take 17 g by mouth daily. 05/05/18  Yes Loletha Grayer, MD  prochlorperazine (COMPAZINE) 10 MG tablet Take 1 tablet (10 mg total) by mouth every 6 (six) hours as needed for nausea or vomiting. 04/30/18  Yes Merlyn Lot, MD  promethazine (PHENERGAN) 25 MG tablet Take 1 tablet (25 mg total) by mouth every 8 (eight) hours as needed for nausea or vomiting. 04/16/18  Yes Johnson, Megan P, DO  protein supplement shake (PREMIER PROTEIN) LIQD Take 325 mLs (11 oz total) by mouth 2 (two) times daily between meals. 05/04/18  Yes Wieting, Richard, MD  rosuvastatin (CRESTOR) 5 MG tablet TAKE ONE TABLET BY MOUTH ONCE DAILY 06/02/17  Yes Wellington Hampshire, MD  sucralfate (CARAFATE) 1 g tablet Take 1 tablet (1 g total) by mouth 2 (two) times daily. 04/30/18 04/30/19 Yes Merlyn Lot, MD  venlafaxine XR (EFFEXOR-XR) 150 MG 24 hr capsule Take 1 capsule (150 mg total) by mouth daily with breakfast. 06/05/18  Yes Kathrine Haddock, NP  Vitamin D, Ergocalciferol, (DRISDOL) 50000 units CAPS capsule Take 1 capsule (50,000 Units total) by mouth every 7 (seven) days. 04/07/18  Yes Kathrine Haddock, NP    Review of Systems    Intermittent chest discomfort, typically occurring at rest.  Chronic stable dyspnea on exertion.  Chronic nausea with intermittent vomiting in the setting of gastroparesis.  She denies palpitations, PND, orthopnea, dizziness, syncope, edema, or early satiety.  All other systems reviewed and are otherwise negative except as noted above.  Physical Exam    VS:  BP 104/62 (BP Location: Left Arm, Patient Position: Sitting, Cuff Size: Large)   Pulse 79   Ht 5\' 5"  (1.651 m)   Wt 239 lb (108.4 kg)   BMI 39.77 kg/m  , BMI Body mass index is 39.77 kg/m. GEN: Well nourished, well developed, in no acute distress.  HEENT: normal.  Neck: Supple, no JVD, carotid bruits, or masses. Cardiac: RRR, no murmurs, rubs, or gallops. No clubbing,  cyanosis, edema.  Radials/DP/PT 2+ and equal bilaterally.  Respiratory:  Respirations regular and unlabored, clear to auscultation bilaterally. GI: Soft, nontender, nondistended, BS + x 4. MS: no deformity or atrophy. Skin: warm and dry, no rash. Neuro:  Strength and sensation are intact. Psych: Normal affect.  Accessory Clinical Findings    ECG -regular sinus rhythm, 79, no acute ST or T changes.  Assessment & Plan    1.  Coronary artery disease: Status post prior circumflex and obtuse marginal stenting in the setting  of a non-STEMI in early 2016 with subsequent catheterization a month later revealing patent stents.  Low risk stress test in 2017.  She has chronic stable intermittent chest discomfort, typically occurring at rest, lasting a few minutes, and resolving spontaneously.  There has been no change in frequency or severity of symptoms and overall, she feels as though symptoms are stable on isosorbide therapy.  We discussed the role of ischemic testing and have mutually decided to continue medical therapy without further testing at this time.  She remains on aspirin, nitrate, beta-blocker, and statin therapy.  She is due for fasting lipids but is not fasting today.  We will arrange.  2.  Essential hypertension: Stable.  3.  Hyperlipidemia: LDL was 82 in February.  LFTs were normal in May of this year.  Follow-up fasting lipids.  4.  Type 2 diabetes mellitus: A1c 11.5 in February of this year.  She is on insulin therapy and managed by primary care.  Complicated by diabetic gastroparesis.  5.  Ischemic cardiomyopathy: Resolved with normalized LV function by cath in May 2016.  Most recent echo last fall again showed normal LV function.  Euvolemic on exam.  Heart rate and blood pressure stable.  6.  Stage II-III chronic kidney disease: Creatinine was elevated on recent admission to [facility] in the setting of nausea/vomiting and gastroparesis.  Discharge creatinine 1.41.  7.   Disposition: Follow-up in 6 months or sooner if necessary.   Murray Hodgkins, NP 06/09/2018, 3:21 PM

## 2018-06-09 NOTE — Addendum Note (Signed)
Addended by: Aris Georgia, Anwyn Kriegel L on: 06/09/2018 04:49 PM   Modules accepted: Orders

## 2018-06-09 NOTE — Patient Instructions (Signed)
Medication Instructions:  Your physician recommends that you continue on your current medications as directed. Please refer to the Current Medication list given to you today.  Labwork: Your physician recommends that you return for lab work, fasting lipids in the next couple of weeks.   Testing/Procedures: NONE today.  Follow-Up: Your physician wants you to follow-up in: 6 months with Dr. Fletcher Anon. You will receive a reminder letter in the mail two months in advance. If you don't receive a letter, please call our office to schedule the follow-up appointment.   If you need a refill on your cardiac medications before your next appointment, please call your pharmacy.

## 2018-07-27 ENCOUNTER — Telehealth: Payer: Self-pay

## 2018-08-19 NOTE — Telephone Encounter (Signed)
Opened in error

## 2018-09-08 ENCOUNTER — Other Ambulatory Visit: Payer: Self-pay | Admitting: Cardiovascular Disease

## 2018-09-08 ENCOUNTER — Other Ambulatory Visit: Payer: Self-pay | Admitting: Unknown Physician Specialty

## 2018-09-09 NOTE — Telephone Encounter (Signed)
Requested Prescriptions  Pending Prescriptions Disp Refills  . dicyclomine (BENTYL) 20 MG tablet [Pharmacy Med Name: DICYCLOMINE 20MG     TAB] 90 tablet 1    Sig: TAKE 1 TABLET BY MOUTH EVERY 6 HOURS     Gastroenterology:  Antispasmodic Agents Passed - 09/08/2018  6:56 PM      Passed - Last Heart Rate in normal range    Pulse Readings from Last 1 Encounters:  06/09/18 79         Passed - Valid encounter within last 12 months    Recent Outpatient Visits          5 months ago CKD stage 3 due to type 2 diabetes mellitus ([place])   [facility] Kathrine Haddock, NP   8 months ago Depression, major, single episode, severe ([place])   [place] Kathrine Haddock, NP   10 months ago Poorly controlled type 2 diabetes mellitus ([place])   [place] Kathrine Haddock, NP   11 months ago Primary insomnia   [facility] Kathrine Haddock, NP   1 year ago Poorly controlled type 2 diabetes mellitus ([place])   [place] Kathrine Haddock, NP      Future Appointments            In 3 weeks Cannady, Barbaraann Faster, NP [employer]

## 2018-09-16 LAB — HM DIABETES EYE EXAM

## 2018-10-06 ENCOUNTER — Ambulatory Visit (INDEPENDENT_AMBULATORY_CARE_PROVIDER_SITE_OTHER): Payer: Medicaid Other | Admitting: Nurse Practitioner

## 2018-10-06 ENCOUNTER — Encounter: Payer: Self-pay | Admitting: Nurse Practitioner

## 2018-10-06 ENCOUNTER — Ambulatory Visit: Payer: Medicaid Other | Admitting: Unknown Physician Specialty

## 2018-10-06 VITALS — BP 134/89 | HR 74 | Wt 262.2 lb

## 2018-10-06 DIAGNOSIS — E1169 Type 2 diabetes mellitus with other specified complication: Secondary | ICD-10-CM

## 2018-10-06 DIAGNOSIS — E782 Mixed hyperlipidemia: Secondary | ICD-10-CM | POA: Diagnosis not present

## 2018-10-06 DIAGNOSIS — I130 Hypertensive heart and chronic kidney disease with heart failure and stage 1 through stage 4 chronic kidney disease, or unspecified chronic kidney disease: Secondary | ICD-10-CM

## 2018-10-06 DIAGNOSIS — N183 Chronic kidney disease, stage 3 unspecified: Secondary | ICD-10-CM

## 2018-10-06 DIAGNOSIS — E1165 Type 2 diabetes mellitus with hyperglycemia: Secondary | ICD-10-CM | POA: Diagnosis not present

## 2018-10-06 DIAGNOSIS — K582 Mixed irritable bowel syndrome: Secondary | ICD-10-CM

## 2018-10-06 DIAGNOSIS — E785 Hyperlipidemia, unspecified: Secondary | ICD-10-CM

## 2018-10-06 NOTE — Assessment & Plan Note (Signed)
Chronic, ongoing.  Followed by Dr. Gabriel Carina.  A1C 09/17/18 9.4 and has upcoming appointment with endo.  Continue current med regimen and defer med changes to Dr. Gabriel Carina.  Continue to collaborate with endo and review notes.

## 2018-10-06 NOTE — Assessment & Plan Note (Signed)
Chronic.  Followed by GI [facility].  Discussed utilizing recommended Fibrocon and taking probiotic twice a day.  She is going to try both.  Continue to collaborate with GI.

## 2018-10-06 NOTE — Assessment & Plan Note (Signed)
Chronic, followed by cardiology with next visit upcoming.  BP <140/90 on visit today.  Continue current med regimen and collaborate with cardiology.

## 2018-10-06 NOTE — Patient Instructions (Addendum)
May take probiotic twice a day. Fibercon over the counter.  Probiotics What are probiotics? Probiotics are the good bacteria and yeasts that live in your body and keep you and your digestive system healthy. Probiotics also help your body's defense (immune) system and protect your body against bad bacterial growth. Certain foods contain probiotics, such as yogurt. Probiotics can also be purchased as a supplement. As with any supplement or drug, it is important to discuss its use with your health care provider. What affects the balance of bacteria in my body? The balance of bacteria in your body can be affected by:  Antibiotic medicines. Antibiotics are sometimes necessary to treat infection. Unfortunately, they may kill good or friendly bacteria in your body as well as the bad bacteria. This may lead to stomach problems like diarrhea, gas, and cramping.  Disease. Some conditions are the result of an overgrowth of bad bacteria, yeasts, parasites, or fungi. These conditions include: ? Infectious diarrhea. ? Stomach and respiratory infections. ? Skin infections. ? Irritable bowel syndrome (IBS). ? Inflammatory bowel diseases. ? Ulcer due to Helicobacter pylori (H. pylori) infection. ? Tooth decay and periodontal disease. ? Vaginal infections.  Stress and poor diet may also lower the good bacteria in your body. What type of probiotic is right for me? Probiotics are available over the counter at your local pharmacy, health food, or grocery store. They come in many different forms, combinations of strains, and dosing strengths. Some may need to be refrigerated. Always read the label for storage and usage instructions. Specific strains have been shown to be more effective for certain conditions. Ask your health care provider what option is best for you. Why would I need probiotics? There are many reasons your health care provider might recommend a probiotic supplement,  including:  Diarrhea.  Constipation.  IBS.  Respiratory infections.  Yeast infections.  Acne, eczema, and other skin conditions.  Frequent urinary tract infections (UTIs).  Are there side effects of probiotics? Some people experience mild side effects when taking probiotics. Side effects are usually temporary and may include:  Gas.  Bloating.  Cramping.  Rarely, serious side effects, such as infection or immune system changes, may occur. What else do I need to know about probiotics?  There are many different strains of probiotics. Certain strains may be more effective depending on your condition. Probiotics are available in varying doses. Ask your health care provider which probiotic you should use and how often.  If you are taking probiotics along with antibiotics, it is generally recommended to wait at least 2 hours between taking the antibiotic and taking the probiotic. For more information: [facility] [email or web address] This information is not intended to replace advice given to you by your health care provider. Make sure you discuss any questions you have with your health care provider. Document Released: 06/22/2014 Document Revised: 10/22/2016 Document Reviewed: 02/22/2014 Elsevier Interactive Patient Education  2017 [employer].

## 2018-10-06 NOTE — Assessment & Plan Note (Signed)
Chronic, pt reports she has not ate today.  Continue statin and obtain lipid panel today.

## 2018-10-06 NOTE — Progress Notes (Signed)
BP 134/89 (BP Location: Left Arm, Patient Position: Sitting, Cuff Size: Normal)   Pulse 74   Wt 262 lb 4 oz (119 kg)   SpO2 98%   BMI 43.64 kg/m    Subjective:    Patient ID: [name] [name], female    DOB: 1981-01-03, 37 y.o.   MRN: [id number]  HPI: [name] [name] is a 37 y.o. female presents for HTN/HLD and T2DM  Chief Complaint  Patient presents with  . Diabetes  . Hypertension  . Hyperlipidemia   HYPERTENSION / HYPERLIPIDEMIA Followed by Dr. Fletcher Anon (cardiology) and last seen 06/09/18.  She reports she has follow-up appointment with them this month and on review she appears to have annual echos performed.  Reports some increased edema, noted more throughout day when on feet and improves with elevation.  Last Echo showed EF 60%. Satisfied with current treatment? yes Duration of hypertension: chronic BP monitoring frequency: rarely BP range: Does not check consistently at home BP medication side effects: no Past BP meds: none Duration of hyperlipidemia: chronic Cholesterol medication side effects: none Cholesterol supplements: none Past cholesterol medications: none Medication compliance: good compliance Aspirin: yes Recent stressors: no Recurrent headaches: no Visual changes: no Palpitations: no Dyspnea: no Chest pain: no Lower extremity edema: occasional, improves with elevation Dizzy/lightheaded: no   DIABETES Followed by Dr. Gabriel Carina and had A1C 09/17/18 = 9.4%.  Has upcoming appointment with Dr. Gabriel Carina. Hypoglycemic episodes:no Polydipsia/polyuria: no Visual disturbance: no Chest pain: no Paresthesias: no Glucose Monitoring: yes  Accucheck frequency: TID  Fasting glucose: 100 to 110 (at times 66-79)  Post prandial: 230-240  Evening: 80-200  Before meals: Taking Insulin?: yes  Long acting insulin:  Short acting insulin: Blood Pressure Monitoring: rarely Retinal Examination: Up to Date Foot Exam: Up to Date Diabetic Education: Completed Pneumovax: Up to  Date Influenza: Up to Date Aspirin: yes   IRRITABLE BOWEL SYNDROME: Seen by GI recently at [facility] and gastric emptying testing was reported as normal.  Per Dr. Donnie Mesa note was felt to be possible small intestinal bacterial overgrowth vs gastroparesis.  She has had issues with bowels for several years with fluctuation from diarrhea to constipation.  Dr. Donnie Mesa ordered Xifaxan, which has worked in past for patient, but she reports she did not take it or pick it up d/t cost and Medicaid would not cover.  She also has not started to take as of yet.  Encouraged her to take this medication as recommended.  She is to return to GI clinic in 3 months.  Relevant past medical, surgical, family and social history reviewed and updated as indicated. Interim medical history since our last visit reviewed. Allergies and medications reviewed and updated.    Review of Systems  Constitutional: Negative for activity change, appetite change, chills, fatigue, fever and unexpected weight change.  Respiratory: Negative for cough, chest tightness, shortness of breath and wheezing.   Cardiovascular: Negative for chest pain, palpitations and leg swelling.  Gastrointestinal: Positive for abdominal pain, constipation and diarrhea. Negative for abdominal distention, nausea and vomiting.       Ongoing issues with abdominal pain and fluctuating bowel pattern.  Endocrine: Negative for cold intolerance, heat intolerance, polydipsia, polyphagia and polyuria.  Genitourinary: Negative for difficulty urinating, dysuria, frequency, urgency and vaginal bleeding.  Musculoskeletal: Negative.   Neurological: Negative for dizziness, tremors, syncope, speech difficulty, weakness, numbness and headaches.  Psychiatric/Behavioral: Negative.    Per HPI unless specifically indicated above     Objective:    BP 134/89 (BP Location:  Left Arm, Patient Position: Sitting, Cuff Size: Normal)   Pulse 74   Wt 262 lb 4 oz (119 kg)   SpO2 98%    BMI 43.64 kg/m   Wt Readings from Last 3 Encounters:  10/06/18 262 lb 4 oz (119 kg)  06/09/18 239 lb (108.4 kg)  05/04/18 235 lb (106.6 kg)    Physical Exam  Constitutional: She is oriented to person, place, and time. She appears well-developed and well-nourished.  HENT:  Head: Normocephalic and atraumatic.  Eyes: Pupils are equal, round, and reactive to light. Conjunctivae and EOM are normal.  Neck: Normal range of motion. Neck supple. No JVD present. Carotid bruit is not present. No thyromegaly present.  Cardiovascular: Normal rate, regular rhythm and normal heart sounds.  Pulmonary/Chest: Effort normal and breath sounds normal.  Abdominal: Soft. Bowel sounds are normal. She exhibits no distension. There is no splenomegaly or hepatomegaly. There is no tenderness.  Lymphadenopathy:    She has no cervical adenopathy.  Neurological: She is alert and oriented to person, place, and time. She has normal reflexes.  Skin: Skin is warm and dry.  Psychiatric: She has a normal mood and affect. Her behavior is normal. Judgment and thought content normal.    Results for orders placed or performed during the hospital encounter of 05/02/18  Lipase, blood  Result Value Ref Range   Lipase 17 11 - 51 U/L  Comprehensive metabolic panel  Result Value Ref Range   Sodium 140 135 - 145 mmol/L   Potassium 3.8 3.5 - 5.1 mmol/L   Chloride 110 101 - 111 mmol/L   CO2 18 (L) 22 - 32 mmol/L   Glucose, Bld 187 (H) 65 - 99 mg/dL   BUN 31 (H) 6 - 20 mg/dL   Creatinine, Ser 1.53 (H) 0.44 - 1.00 mg/dL   Calcium 9.4 8.9 - 10.3 mg/dL   Total Protein 7.9 6.5 - 8.1 g/dL   Albumin 3.4 (L) 3.5 - 5.0 g/dL   AST 16 15 - 41 U/L   ALT 13 (L) 14 - 54 U/L   Alkaline Phosphatase 112 38 - 126 U/L   Total Bilirubin 0.6 0.3 - 1.2 mg/dL   GFR calc non Af Amer 42 (L) >60 mL/min   GFR calc Af Amer 49 (L) >60 mL/min   Anion gap 12 5 - 15  CBC  Result Value Ref Range   WBC 10.0 3.6 - 11.0 K/uL   RBC 4.48 3.80 - 5.20  MIL/uL   Hemoglobin 11.7 (L) 12.0 - 16.0 g/dL   HCT 35.2 35.0 - 47.0 %   MCV 78.6 (L) 80.0 - 100.0 fL   MCH 26.1 26.0 - 34.0 pg   MCHC 33.3 32.0 - 36.0 g/dL   RDW 15.4 (H) 11.5 - 14.5 %   Platelets 440 150 - 440 K/uL  Urinalysis, Complete w Microscopic  Result Value Ref Range   Color, Urine YELLOW (A) YELLOW   APPearance CLOUDY (A) CLEAR   Specific Gravity, Urine 1.016 1.005 - 1.030   pH 5.0 5.0 - 8.0   Glucose, UA NEGATIVE NEGATIVE mg/dL   Hgb urine dipstick MODERATE (A) NEGATIVE   Bilirubin Urine NEGATIVE NEGATIVE   Ketones, ur NEGATIVE NEGATIVE mg/dL   Protein, ur 100 (A) NEGATIVE mg/dL   Nitrite NEGATIVE NEGATIVE   Leukocytes, UA LARGE (A) NEGATIVE   RBC / HPF 21-50 0 - 5 RBC/hpf   WBC, UA >50 (H) 0 - 5 WBC/hpf   Bacteria, UA NONE SEEN NONE SEEN  Squamous Epithelial / LPF NONE SEEN 0 - 5   WBC Clumps PRESENT    Budding Yeast PRESENT   hCG, quantitative, pregnancy  Result Value Ref Range   hCG, Beta Chain, Quant, S <1 <5 mIU/mL  Beta-hydroxybutyric acid  Result Value Ref Range   Beta-Hydroxybutyric Acid 2.54 (H) 0.05 - 0.27 mmol/L  Magnesium  Result Value Ref Range   Magnesium 1.9 1.7 - 2.4 mg/dL  Phosphorus  Result Value Ref Range   Phosphorus 4.6 2.5 - 4.6 mg/dL  Urine Drug Screen, Qualitative ([facility] only)  Result Value Ref Range   Tricyclic, Ur Screen POSITIVE (A) NONE DETECTED   Amphetamines, Ur Screen NONE DETECTED NONE DETECTED   MDMA (Ecstasy)Ur Screen NONE DETECTED NONE DETECTED   Cocaine Metabolite,Ur [place] NONE DETECTED NONE DETECTED   Opiate, Ur Screen POSITIVE (A) NONE DETECTED   Phencyclidine (PCP) Ur S NONE DETECTED NONE DETECTED   Cannabinoid 50 Ng, Ur [place] NONE DETECTED NONE DETECTED   Barbiturates, Ur Screen POSITIVE (A) NONE DETECTED   Benzodiazepine, Ur Scrn NONE DETECTED NONE DETECTED   Methadone Scn, Ur NONE DETECTED NONE DETECTED  Glucose, capillary  Result Value Ref Range   Glucose-Capillary 156 (H) 65 - 99 mg/dL   Comment 1 Notify RN     Glucose, capillary  Result Value Ref Range   Glucose-Capillary 107 (H) 65 - 99 mg/dL   Comment 1 Notify RN   Glucose, capillary  Result Value Ref Range   Glucose-Capillary 136 (H) 65 - 99 mg/dL   Comment 1 Notify RN   Glucose, capillary  Result Value Ref Range   Glucose-Capillary 125 (H) 65 - 99 mg/dL  Glucose, capillary  Result Value Ref Range   Glucose-Capillary 95 65 - 99 mg/dL   Comment 1 Notify RN   Glucose, capillary  Result Value Ref Range   Glucose-Capillary 148 (H) 65 - 99 mg/dL   Comment 1 Notify RN   Basic metabolic panel  Result Value Ref Range   Sodium 137 135 - 145 mmol/L   Potassium 3.3 (L) 3.5 - 5.1 mmol/L   Chloride 111 101 - 111 mmol/L   CO2 21 (L) 22 - 32 mmol/L   Glucose, Bld 139 (H) 65 - 99 mg/dL   BUN 24 (H) 6 - 20 mg/dL   Creatinine, Ser 1.41 (H) 0.44 - 1.00 mg/dL   Calcium 8.0 (L) 8.9 - 10.3 mg/dL   GFR calc non Af Amer 47 (L) >60 mL/min   GFR calc Af Amer 54 (L) >60 mL/min   Anion gap 5 5 - 15  Glucose, capillary  Result Value Ref Range   Glucose-Capillary 107 (H) 65 - 99 mg/dL   Comment 1 Notify RN   Glucose, capillary  Result Value Ref Range   Glucose-Capillary 153 (H) 65 - 99 mg/dL  Glucose, capillary  Result Value Ref Range   Glucose-Capillary 152 (H) 65 - 99 mg/dL      Assessment & Plan:   Problem List Items Addressed This Visit      Cardiovascular and Mediastinum   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  BP <140/90 on visit today.  Continue current med regimen and collaborate with cardiology.        Digestive   IBS (irritable bowel syndrome)    Chronic.  Followed by GI [facility].  Discussed utilizing recommended Fibrocon and taking probiotic twice a day.  She  is going to try both.  Continue to collaborate with GI.        Endocrine   Poorly controlled type 2 diabetes mellitus (HCC)    Chronic, ongoing.  Followed by Dr. Gabriel Carina.  A1C  09/17/18 9.4 and has upcoming appointment with endo.  Continue current med regimen and defer med changes to Dr. Gabriel Carina.  Continue to collaborate with endo and review notes.      Relevant Medications   glimepiride (AMARYL) 4 MG tablet   FARXIGA 10 MG TABS tablet     Other   Hyperlipidemia    Chronic, pt reports she has not ate today.  Continue statin and obtain lipid panel today.       Other Visit Diagnoses    Hyperlipidemia associated with type 2 diabetes mellitus ([place])    -  Primary   Relevant Medications   glimepiride (AMARYL) 4 MG tablet   FARXIGA 10 MG TABS tablet   Other Relevant Orders   Lipid Profile       Follow up plan: Return in about 3 months (around 01/06/2019) for T2DM, HTN, HLD.

## 2018-10-07 LAB — LIPID PANEL
Chol/HDL Ratio: 6.1 ratio — ABNORMAL HIGH (ref 0.0–4.4)
Cholesterol, Total: 236 mg/dL — ABNORMAL HIGH (ref 100–199)
HDL: 39 mg/dL — ABNORMAL LOW (ref >39)
LDL Calculated: 163 mg/dL — ABNORMAL HIGH (ref 0–99)
Triglycerides: 171 mg/dL — ABNORMAL HIGH (ref 0–149)
VLDL Cholesterol Cal: 34 mg/dL (ref 5–40)

## 2018-10-21 ENCOUNTER — Other Ambulatory Visit: Payer: Self-pay | Admitting: Unknown Physician Specialty

## 2018-10-21 NOTE — Telephone Encounter (Signed)
Refill requests approved.

## 2018-10-21 NOTE — Telephone Encounter (Signed)
Requested medication (s) are due for refill today -yes  Requested medication (s) are on the active medication list -yes  Future visit scheduled -yes  Last refill: pantoprazole 40 mg 12/17/17                  Glimepiride 4 mg listed as historical  Notes to clinic: former [facility] patient- patient has established with Cannady and needs these refills reviewed and filled under her name.  Requested Prescriptions  Pending Prescriptions Disp Refills   pantoprazole (PROTONIX) 40 MG tablet [Pharmacy Med Name: PANTOPRAZOLE SOD 40MG  TAB] 90 tablet 1    Sig: TAKE 1 TABLET BY MOUTH ONCE DAILY AT 6AM     Gastroenterology: Proton Pump Inhibitors Passed - 10/21/2018 10:43 AM      Passed - Valid encounter within last 12 months    Recent Outpatient Visits          2 weeks ago Poorly controlled type 2 diabetes mellitus ([place])   [place], Jolene T, NP   6 months ago CKD stage 3 due to type 2 diabetes mellitus ([place])   [place] Kathrine Haddock, NP   9 months ago Depression, major, single episode, severe ([place])   [place] Kathrine Haddock, NP   11 months ago Poorly controlled type 2 diabetes mellitus ([place])   [place] Kathrine Haddock, NP   1 year ago Primary insomnia   [facility] [place], Malachy Mood, NP      Future Appointments            In 2 months Cannady, Barbaraann Faster, NP [facility]          glimepiride (AMARYL) 4 MG tablet [Pharmacy Med Name: GLIMEPIRIDE 4MG      TAB] 90 tablet 1    Sig: TAKE 1 TABLET BY MOUTH ONCE DAILY     Endocrinology:  Diabetes - Sulfonylureas Failed - 10/21/2018 10:43 AM      Failed - HBA1C is between 0 and 7.9 and within 180 days    Hemoglobin A1C  Date Value Ref Range Status  09/02/2016 6.9%  Final   Hgb A1c MFr Bld  Date Value Ref Range Status  01/22/2018 11.5 (H) 4.8 - 5.6 % Final    Comment:    (NOTE) Pre diabetes:          5.7%-6.4% Diabetes:               >6.4% Glycemic control for   <7.0% adults with diabetes          Passed - Valid encounter within last 6 months    Recent Outpatient Visits          2 weeks ago Poorly controlled type 2 diabetes mellitus ([place])   [place], Jolene T, NP   6 months ago CKD stage 3 due to type 2 diabetes mellitus ([place])   [facility] Kathrine Haddock, NP   9 months ago Depression, major, single episode, severe ([place])   [place] Kathrine Haddock, NP   11 months ago Poorly controlled type 2 diabetes mellitus ([place])   [place] Kathrine Haddock, NP   1 year ago Primary insomnia   [facility] Kathrine Haddock, NP      Future Appointments            In 2 months Cannady, Barbaraann Faster, NP [facility]  Requested Prescriptions  Pending Prescriptions Disp Refills   pantoprazole (PROTONIX) 40 MG tablet [Pharmacy Med Name: PANTOPRAZOLE SOD 40MG  TAB] 90 tablet 1    Sig: TAKE 1 TABLET BY MOUTH ONCE DAILY AT 6AM     Gastroenterology: Proton Pump Inhibitors Passed - 10/21/2018 10:43 AM      Passed - Valid encounter within last 12 months    Recent Outpatient Visits          2 weeks ago Poorly controlled type 2 diabetes mellitus ([place])   [place], Jolene T, NP   6 months ago CKD stage 3 due to type 2 diabetes mellitus ([place])   [place] Kathrine Haddock, NP   9 months ago Depression, major, single episode, severe ([place])   [place] Kathrine Haddock, NP   11 months ago Poorly controlled type 2 diabetes mellitus ([place])   [place] Kathrine Haddock, NP   1 year ago Primary insomnia   [facility] [place], Malachy Mood, NP      Future Appointments            In 2 months Cannady, Barbaraann Faster, NP [facility]          glimepiride (AMARYL) 4 MG tablet [Pharmacy Med Name: GLIMEPIRIDE 4MG      TAB] 90 tablet 1    Sig: TAKE 1 TABLET BY  MOUTH ONCE DAILY     Endocrinology:  Diabetes - Sulfonylureas Failed - 10/21/2018 10:43 AM      Failed - HBA1C is between 0 and 7.9 and within 180 days    Hemoglobin A1C  Date Value Ref Range Status  09/02/2016 6.9%  Final   Hgb A1c MFr Bld  Date Value Ref Range Status  01/22/2018 11.5 (H) 4.8 - 5.6 % Final    Comment:    (NOTE) Pre diabetes:          5.7%-6.4% Diabetes:              >6.4% Glycemic control for   <7.0% adults with diabetes          Passed - Valid encounter within last 6 months    Recent Outpatient Visits          2 weeks ago Poorly controlled type 2 diabetes mellitus ([place])   [place], Jolene T, NP   6 months ago CKD stage 3 due to type 2 diabetes mellitus ([place])   [facility] Kathrine Haddock, NP   9 months ago Depression, major, single episode, severe ([place])   [place] Kathrine Haddock, NP   11 months ago Poorly controlled type 2 diabetes mellitus ([place])   [place] Kathrine Haddock, NP   1 year ago Primary insomnia   [facility] Kathrine Haddock, NP      Future Appointments            In 2 months Cannady, Barbaraann Faster, NP [facility]

## 2018-11-09 NOTE — Telephone Encounter (Signed)
Patient called and agreeable to come in tomorrow for appointment with Ignacia Bayley, NP. States she is due this month for 6 month f/u.

## 2018-11-10 ENCOUNTER — Encounter: Payer: Self-pay | Admitting: Nurse Practitioner

## 2018-11-10 ENCOUNTER — Ambulatory Visit (INDEPENDENT_AMBULATORY_CARE_PROVIDER_SITE_OTHER): Payer: Medicaid Other | Admitting: Nurse Practitioner

## 2018-11-10 VITALS — BP 140/86 | HR 93 | Ht 65.0 in | Wt 267.5 lb

## 2018-11-10 DIAGNOSIS — N183 Chronic kidney disease, stage 3 unspecified: Secondary | ICD-10-CM

## 2018-11-10 DIAGNOSIS — I25118 Atherosclerotic heart disease of native coronary artery with other forms of angina pectoris: Secondary | ICD-10-CM

## 2018-11-10 DIAGNOSIS — I5033 Acute on chronic diastolic (congestive) heart failure: Secondary | ICD-10-CM | POA: Diagnosis not present

## 2018-11-10 DIAGNOSIS — I10 Essential (primary) hypertension: Secondary | ICD-10-CM

## 2018-11-10 DIAGNOSIS — E785 Hyperlipidemia, unspecified: Secondary | ICD-10-CM

## 2018-11-10 MED ORDER — FUROSEMIDE 20 MG PO TABS: 20.0000 mg | ORAL_TABLET | Freq: Every day | ORAL | 3 refills | Status: DC

## 2018-11-10 NOTE — Progress Notes (Signed)
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  Past Medical History    Past Medical History:  Diagnosis Date  . CAD (coronary artery disease)    a. 03/2015 NSTEMI/PCI: LCX 170m (DES), OM1 100p (DES - vessel labeled Ramus in subsequent cath report). Minor irregs to LAD/RCA; b. 04/2015 Cath: LM nl, LAD 40p, RI patent stent, LCX patent stent, RCA nl, EF 55-65%; c. 02/2016 MV: basal inferolateral and mid inferolateral defect/infarct w/o ischemia. EF 55-65%. Low risk.  . Chronic systolic CHF (congestive heart failure) ([place])    a. 03/2015 Echo: EF 30-35%, mild concentric LVH, severe HK of inf, inflat, & lat walls, mod MR, mild TR;  b. 04/2015 LV Gram: EF 55-65%; c. 09/2017 Echo: EF 55-60%, no rwma. Mild MR.  . CKD (chronic kidney disease), stage III ([place])   . Diabetes type 2, controlled ([place])    a. since 2002   . Diabetic gastroparesis ([facility])    a. chronic nausea/vomiting.  . Diverticulosis, sigmoid 07/2016  . Heavy menses    a. H/O IUD - expired in 2014 - remains in place.  . Hyperlipidemia   . Hypertension   . Iron deficiency anemia   . Ischemic cardiomyopathy (resolved)    a. 03/2015 EF 30-35% post NSTEMI;  b. 04/2015 EF 55-65% on LV gram; c. 09/2017 Echo: EF 55-60%.  . Obesity   . Tobacco abuse    a. quit 03/2015.  Marland Kitchen Uterine fibroid   . Vertigo   . Vitamin D deficiency    Past Surgical History:  Procedure Laterality Date  . APPENDECTOMY    . CARDIAC CATHETERIZATION  4/16   x2 stent [facility]  .  CARDIAC CATHETERIZATION N/A 05/05/2015   Procedure: Left Heart Cath and Coronary Angiography;  Surgeon: Peter M Martinique, MD;  Location: [place] [facility];  Service: Cardiovascular;  Laterality: N/A;  . COLONOSCOPY WITH PROPOFOL N/A 07/31/2016   Procedure: COLONOSCOPY WITH PROPOFOL;  Surgeon: Lollie Sails, MD;  Location: [facility] ENDOSCOPY;  Service: Endoscopy;  Laterality: N/A;  . ESOPHAGOGASTRODUODENOSCOPY (EGD) WITH PROPOFOL N/A 07/31/2016   Procedure: ESOPHAGOGASTRODUODENOSCOPY (EGD) WITH PROPOFOL;  Surgeon: Lollie Sails, MD;  Location: [facility] ENDOSCOPY;  Service: Endoscopy;  Laterality: N/A;  . LAPAROSCOPIC APPENDECTOMY N/A 08/07/2016   Procedure: APPENDECTOMY LAPAROSCOPIC;  Surgeon: Hubbard Robinson, MD;  Location: [facility] ORS;  Service: General;  Laterality: N/A;  . MOUTH SURGERY      Allergies  Allergies  Allergen Reactions  . Lisinopril Anaphylaxis, Itching and Swelling  . Rosemary Oil Anaphylaxis  . Shellfish Allergy Itching and Swelling  . Other     [place] seasoning  . Metformin And Related Diarrhea, Nausea And Vomiting and Rash    History of Present Illness    37 year old female with the above complex past medical history including type 2 diabetes mellitus which was diagnosed 2002, diabetic gastroparesis with chronic nausea and vomiting, coronary artery disease, ischemic cardia myopathy with normalization of LV function, stage II-III chronic kidney disease, hypertension, hyperlipidemia, and obesity.  Cardiac history dates back to April 2016, when she  was hospitalized with chest pain and non-STEMI.  She was found to have LV dysfunction with an EF of 30 to 35% and underwent diagnostic catheter ablation revealing severe circumflex and OM1 disease.  Both areas were successfully treated with drug-eluting stents.  Follow-up catheter ablation May 2016 revealed patency of both stents with normalization of LV function by ventriculography, EF 55 to 65%.  Of note, There is a report from May  2016 refers to the obtuse marginal stent as a ramus intermedius stent.  Stress testing was performed in March 2017 which was nonischemic and low risk.  Last echo in September 2018 showed normal LV function.  She was last seen in clinic in July 2019, at which time she reported intermittent chest discomfort, 2-3 times per week which was stable over several years.  As there was no change in frequency or severity of symptoms, we continued medical therapy and did not pursue further ischemic testing.  She says that over the past several months, she has been experiencing an increase in dyspnea on exertion and also frequency of rest and exertional chest discomfort, now occurring most days of the week, lasting 15 to 30 minutes, and resolving spontaneously.  Her mother is with her today and says that she has noted significant worsening of dyspnea on exertion.  Of note, patient's weight in July was 239 pounds and she is up to 267.  Patient says she hardly eats anything and though she goes out to fast food restaurants, she rarely finishes a meal.  In the setting of all this, she has noticed an increase in lower extremity swelling.  She denies PND, orthopnea, dizziness, syncope, or early satiety.  Home Medications    Prior to Admission medications   Medication Sig Start Date End Date Taking? Authorizing Provider  albuterol (PROVENTIL HFA;VENTOLIN HFA) 108 (90 Base) MCG/ACT inhaler Inhale 2 puffs into the lungs every 6 (six) hours as needed for wheezing or shortness of breath. 38/14/18  Yes Volney American, PA-C  aspirin EC 81 MG tablet Take 81 mg by mouth daily.   Yes [provider]  benzonatate (TESSALON) 100 MG capsule Take 1 capsule (100 mg total) by mouth 2 (two) times daily as needed for cough. 01/07/18  Yes Kathrine Haddock, NP  Cholecalciferol 2000 units TABS Take 1 tablet by mouth daily.   Yes [provider]  dicyclomine (BENTYL) 20 MG tablet TAKE 1 TABLET BY MOUTH EVERY 6 HOURS 09/09/18   Yes Kathrine Haddock, NP  DULoxetine (CYMBALTA) 30 MG capsule TAKE 1 CAPSULE BY MOUTH DAILY FOR 1 WEEK THEN TAKE 2 CAPSULES BY MOUTH DAILY THEREAFTER 07/01/18  Yes [provider]  etonogestrel (NEXPLANON) 68 MG IMPL implant 1 each by Subdermal route once.   Yes [provider]  FARXIGA 10 MG TABS tablet TAKE 1 TABLET BY MOUTH ONCE DAILY IN THE MORNING 09/08/18  Yes [provider]  ferrous sulfate 325 (65 FE) MG tablet Take 1 tablet (325 mg total) by mouth daily. 05/04/18  Yes Wieting, Richard, MD  glimepiride (AMARYL) 4 MG tablet TAKE 1 TABLET BY MOUTH ONCE DAILY 10/21/18  Yes Cannady, Jolene T, NP  glucose blood (ACCU-CHEK ACTIVE STRIPS) test strip Use as instructed 02/11/18  Yes Volney American, PA-C  glucose blood (ACCU-CHEK AVIVA) test strip 1 each by Other route as needed for other. Use as instructed   Yes [provider]  insulin aspart (NOVOLOG) 100 UNIT/ML injection Inject 10 Units into the skin 3 (three) times daily with  meals. 05/04/18  Yes Wieting, Richard, MD  insulin glargine (LANTUS) 100 UNIT/ML injection Inject 0.4 mLs (40 Units total) into the skin at bedtime. 05/04/18  Yes Wieting, Richard, MD  Insulin Pen Needle 32G X 4 MM MISC 1 Units by Does not apply route every morning. Pen needles 08/27/17  Yes Kathrine Haddock, NP  isosorbide mononitrate (IMDUR) 30 MG 24 hr tablet TAKE 1 TABLET BY MOUTH ONCE DAILY 09/09/18  Yes Theora Gianotti, NP  meclizine (ANTIVERT) 32 MG tablet Take 1 tablet (32 mg total) by mouth 3 (three) times daily as needed. Patient taking differently: Take 32 mg by mouth 3 (three) times daily as needed for dizziness or nausea.  06/21/16  Yes Kathrine Haddock, NP  metoprolol tartrate (LOPRESSOR) 50 MG tablet Take 1 tablet (50 mg total) by mouth 2 (two) times daily. 06/09/18  Yes Theora Gianotti, NP  mirtazapine (REMERON) 30 MG tablet Take 30 mg by mouth at bedtime. 08/12/18  Yes [provider]  mupirocin ointment  (BACTROBAN) 2 % Place 1 application into the nose 2 (two) times daily. 01/24/18  Yes Georgette Shell, MD  nitroGLYCERIN (NITROSTAT) 0.4 MG SL tablet Take 1 tab under your tongue while sitting for chest pain, If no relief may repeat, one tab every 5 min up to 3 tabs total over 15 mins. 06/09/18  Yes Theora Gianotti, NP  pantoprazole (PROTONIX) 40 MG tablet TAKE 1 TABLET BY MOUTH ONCE DAILY AT 6AM 10/21/18  Yes Cannady, Jolene T, NP  polyethylene glycol (MIRALAX / GLYCOLAX) packet Take 17 g by mouth daily. 05/05/18  Yes Wieting, Richard, MD  rosuvastatin (CRESTOR) 5 MG tablet TAKE ONE TABLET BY MOUTH ONCE DAILY 06/02/17  Yes Wellington Hampshire, MD    Review of Systems    Increasing dyspnea and rest and exertional chest pain, along with lower extremity swelling.  She has also had early satiety.  She denies palpitations, PND, orthopnea, dizziness, syncope.  All other systems reviewed and are otherwise negative except as noted above.  Physical Exam    VS:  BP 140/86 (BP Location: Left Arm, Patient Position: Sitting, Cuff Size: Large)   Pulse 93   Ht 5\' 5"  (1.651 m)   Wt 267 lb 8 oz (121.3 kg)   BMI 44.51 kg/m  , BMI Body mass index is 44.51 kg/m. GEN: Obese, in no acute distress. HEENT: normal. Neck: Supple, obese, difficult to gauge JVP.  No carotid bruits, or masses. Cardiac: RRR, no murmurs, rubs, or gallops. No clubbing, cyanosis, 1+ bilateral ankle edema.  Radials/DP/PT 2+ and equal bilaterally.  Respiratory:  Respirations regular and unlabored, clear to auscultation bilaterally. GI: Obese, protuberant, semi-firm, nontender, BS + x 4. MS: no deformity or atrophy. Skin: warm and dry, no rash. Neuro:  Strength and sensation are intact. Psych: Normal affect.  Accessory Clinical Findings    ECG personally reviewed by me today -regular sinus rhythm, 93, left atrial enlargement, left axis deviation, prior septal infarct- no acute changes.  Assessment & Plan    1.  Coronary  artery disease/stable angina: Status post prior non-STEMI in 2016 with PCI of the obtuse marginal and circumflex.  She has had some degree of chronic rest and exertional chest pain since then.  She has noted in the past few months, in the setting of increasing dyspnea on exertion that she is more likely to have chest pain multiple days a week.  This typically lasts about 15 to 30 minutes and resolve spontaneously and is  similar in character to prior episodes.  Her last ischemic evaluation was in March 2017, at which time she had a nonischemic Myoview.  Her bigger complaint today however is dyspnea on exertion and also lower extremity edema.  I will plan to follow-up an echocardiogram to reevaluate LV function.  If there are new wall motion abnormalities, she will require diagnostic catheterization.  Due to body habitus, she is not an ideal candidate for nuclear imaging.  She will continue on aspirin, nitrate, beta-blocker, and statin therapy.  2.  Acute on chronic diastolic congestive heart failure: Patient previously with a history of an ischemic cardiomyopathy and systolic heart failure though EF is subsequently improved and was 55 to 60% by echocardiogram in October 2018.  She has been having increasing dyspnea on exertion and new lower extremity swelling.  Weight is up 28 pounds since her last visit though this is out of proportion to her degree of volume excess on examination.  I am going to follow-up a basic metabolic panel today and have added Lasix 20 mg daily.  We will plan to follow-up basic metabolic panel in 1 week and check echo.  I will see her back in the next 1 to 2 weeks.  We discussed the importance of daily weights, sodium restriction, medication compliance, and symptom reporting and she verbalizes understanding.   3.  Essential hypertension: Blood pressure moderately elevated today.  Adding Lasix as above.  We will have to watch renal function closely.  4.  Stage II-III chronic kidney  disease: Recent creatinine was 1.2 on October 10.  Following up basic metabolic panel today and again in 1 week in the setting of initiation of Lasix.  Ideally would try to avoid Lasix long-term.  5.  Morbid obesity: Likely contributing to poor exercise tolerance.  6.  Type 2 diabetes mellitus: Last A1c was 9.4 in October.  This is followed by primary care and she is on insulin.  7.  HL:  Cont statin -back on low-dose Crestor after experiencing myalgias on a higher dose.   8.  Disposition: Follow-up basic metabolic panel today and in 1 week.  Follow-up echo.  Follow-up in clinic within the next 1 to 2 weeks.  Murray Hodgkins, NP 11/10/2018, 4:01 PM

## 2018-11-10 NOTE — Patient Instructions (Signed)
Medication Instructions:  Your physician has recommended you make the following change in your medication:  1- START Furosemide 20 mg by mouth once a day.  If you need a refill on your cardiac medications before your next appointment, please call your pharmacy.   Lab work: 1. Your physician recommends that you return for lab work in: [place] - BMET.   2. Your physician recommends that you return for lab work in: 1 week on 11/17/18 at the [facility]. - Please go to the [facility]. You will check in at the front desk to the right as you walk into the atrium. Valet Parking is offered if needed.   If you have labs (blood work) drawn today and your tests are completely normal, you will receive your results only by: Marland Kitchen MyChart Message (if you have MyChart) OR . A paper copy in the mail If you have any lab test that is abnormal or we need to change your treatment, we will call you to review the results.  Testing/Procedures: Your physician has requested that you have an echocardiogram. Echocardiography is a painless test that uses sound waves to create images of your heart. It provides your doctor with information about the size and shape of your heart and how well your heart's chambers and valves are working. This procedure takes approximately one hour. There are no restrictions for this procedure. You may get an IV, if needed, to receive an ultrasound enhancing agent through to better visualize your heart.     Follow-Up: At [facility], you and your health needs are our priority.  As part of our continuing mission to provide you with exceptional heart care, we have created designated Provider Care Teams.  These Care Teams include your primary Cardiologist (physician) and Advanced Practice Providers (APPs -  Physician Assistants and Nurse Practitioners) who all work together to provide you with the care you need, when you need it. You will need a follow up appointment in 2 weeks.  You may see Kathlyn Sacramento, MD or one of the following Advanced Practice Providers on your designated Care Team:   Murray Hodgkins, NP Christell Faith, PA-C . Marrianne Mood, PA-C

## 2018-11-11 LAB — BASIC METABOLIC PANEL
BUN/Creatinine Ratio: 18 (ref 9–23)
BUN: 23 mg/dL — ABNORMAL HIGH (ref 6–20)
CO2: 17 mmol/L — ABNORMAL LOW (ref 20–29)
Calcium: 9 mg/dL (ref 8.7–10.2)
Chloride: 105 mmol/L (ref 96–106)
Creatinine, Ser: 1.26 mg/dL — ABNORMAL HIGH (ref 0.57–1.00)
GFR calc Af Amer: 62 mL/min/{1.73_m2} (ref >59)
GFR calc non Af Amer: 54 mL/min/{1.73_m2} — ABNORMAL LOW (ref >59)
Glucose: 221 mg/dL — ABNORMAL HIGH (ref 65–99)
Potassium: 5 mmol/L (ref 3.5–5.2)
Sodium: 137 mmol/L (ref 134–144)

## 2018-12-01 ENCOUNTER — Ambulatory Visit (INDEPENDENT_AMBULATORY_CARE_PROVIDER_SITE_OTHER): Payer: Medicaid Other

## 2018-12-01 ENCOUNTER — Other Ambulatory Visit: Payer: Self-pay

## 2018-12-01 DIAGNOSIS — I5033 Acute on chronic diastolic (congestive) heart failure: Secondary | ICD-10-CM

## 2018-12-03 ENCOUNTER — Encounter: Payer: Self-pay | Admitting: Nurse Practitioner

## 2018-12-03 ENCOUNTER — Telehealth: Payer: Self-pay

## 2018-12-03 ENCOUNTER — Other Ambulatory Visit
Admission: RE | Admit: 2018-12-03 | Discharge: 2018-12-03 | Disposition: A | Discharge status: Home / Self Care | Payer: Medicaid Other | Source: Ambulatory Visit | Attending: Nurse Practitioner | Admitting: Nurse Practitioner

## 2018-12-03 ENCOUNTER — Ambulatory Visit (INDEPENDENT_AMBULATORY_CARE_PROVIDER_SITE_OTHER): Payer: Medicaid Other | Admitting: Nurse Practitioner

## 2018-12-03 VITALS — BP 120/82 | HR 76 | Ht 65.0 in | Wt 269.8 lb

## 2018-12-03 DIAGNOSIS — I5032 Chronic diastolic (congestive) heart failure: Secondary | ICD-10-CM

## 2018-12-03 DIAGNOSIS — Z794 Long term (current) use of insulin: Secondary | ICD-10-CM

## 2018-12-03 DIAGNOSIS — E785 Hyperlipidemia, unspecified: Secondary | ICD-10-CM

## 2018-12-03 DIAGNOSIS — E119 Type 2 diabetes mellitus without complications: Secondary | ICD-10-CM | POA: Diagnosis not present

## 2018-12-03 DIAGNOSIS — N182 Chronic kidney disease, stage 2 (mild): Secondary | ICD-10-CM

## 2018-12-03 DIAGNOSIS — I10 Essential (primary) hypertension: Secondary | ICD-10-CM | POA: Diagnosis not present

## 2018-12-03 DIAGNOSIS — I25119 Atherosclerotic heart disease of native coronary artery with unspecified angina pectoris: Secondary | ICD-10-CM | POA: Diagnosis not present

## 2018-12-03 LAB — CBC
HCT: 35.7 % — ABNORMAL LOW (ref 36.0–46.0)
Hemoglobin: 11 g/dL — ABNORMAL LOW (ref 12.0–15.0)
MCH: 25.2 pg — ABNORMAL LOW (ref 26.0–34.0)
MCHC: 30.8 g/dL (ref 30.0–36.0)
MCV: 81.7 fL (ref 80.0–100.0)
Platelets: 476 10*3/uL — ABNORMAL HIGH (ref 150–400)
RBC: 4.37 MIL/uL (ref 3.87–5.11)
RDW: 16.2 % — ABNORMAL HIGH (ref 11.5–15.5)
WBC: 6.8 10*3/uL (ref 4.0–10.5)
nRBC: 0 % (ref 0.0–0.2)

## 2018-12-03 LAB — BASIC METABOLIC PANEL
Anion gap: 7 (ref 5–15)
BUN: 38 mg/dL — ABNORMAL HIGH (ref 6–20)
CO2: 21 mmol/L — ABNORMAL LOW (ref 22–32)
Calcium: 9 mg/dL (ref 8.9–10.3)
Chloride: 108 mmol/L (ref 98–111)
Creatinine, Ser: 1.65 mg/dL — ABNORMAL HIGH (ref 0.44–1.00)
GFR calc Af Amer: 45 mL/min — ABNORMAL LOW (ref >60)
GFR calc non Af Amer: 39 mL/min — ABNORMAL LOW (ref >60)
Glucose, Bld: 144 mg/dL — ABNORMAL HIGH (ref 70–99)
Potassium: 4.4 mmol/L (ref 3.5–5.1)
Sodium: 136 mmol/L (ref 135–145)

## 2018-12-03 NOTE — Patient Instructions (Addendum)
Medication Instructions:  Your physician recommends that you continue on your current medications as directed. Please refer to the Current Medication list given to you today.  If you need a refill on your cardiac medications before your next appointment, please call your pharmacy.   Lab work: Your physician recommends that you return for lab work today at the medical mall.  ( CBC, BMET)   If you have labs (blood work) drawn today and your tests are completely normal, you will receive your results only by: Marland Kitchen MyChart Message (if you have MyChart) OR . A paper copy in the mail If you have any lab test that is abnormal or we need to change your treatment, we will call you to review the results.  Testing/Procedures: Your physician has requested that you have a cardiac catheterization. Cardiac catheterization is used to diagnose and/or treat various heart conditions. Doctors may recommend this procedure for a number of different reasons. The most common reason is to evaluate chest pain. Chest pain can be a symptom of coronary artery disease (CAD), and cardiac catheterization can show whether plaque is narrowing or blocking your heart's arteries. This procedure is also used to evaluate the valves, as well as measure the blood flow and oxygen levels in different parts of your heart. For further information please visit [email or web address]. Please follow instruction sheet, as given.     [address], [place] [address] Dept: [phone] Loc: [place]  12/03/2018  You are scheduled for a Cardiac Catheterization on Friday, December 27 with Dr. Kathlyn Sacramento.  1. Please arrive at the [place] [facility] at 7:30 AM (This time is one hour before your procedure to ensure your preparation). Free valet parking service is available.   Special note: Every effort is made to  have your procedure done on time. Please understand that emergencies sometimes delay scheduled procedures.  2. Diet: Do not eat solid foods after midnight.  The patient may have clear liquids until 5am upon the day of the procedure.  3. Labs: You will need to have blood drawn on Thursday, December 26 at [facility], Go to 1st desk on your right to register.  Address: [address], [place] [address]  Open: 8am - 5pm  Phone: [phone]. You do not need to be fasting.  4. Medication instructions in preparation for your procedure:   Hold Lasix starting today.   Take only 20 units of insulin the night before your procedure. Do not take any insulin on the day of the procedure.  Do not take Diabetes Med Glimepiride and Farxiga on the day of the procedure and HOLD 48 HOURS AFTER THE PROCEDURE.  On the morning of your procedure, take your Aspirin and any morning medicines NOT listed above.  You may use sips of water.  5. Plan for one night stay--bring personal belongings. 6. Bring a current list of your medications and current insurance cards. 7. You MUST have a responsible person to drive you home. 8. Someone MUST be with you the first 24 hours after you arrive home or your discharge will be delayed. 9. Please wear clothes that are easy to get on and off and wear slip-on shoes.  Thank you for allowing [place] to care for you!   -- [facility]   Follow-Up: At [facility], you and your health needs are our priority.  As  part of our continuing mission to provide you with exceptional heart care, we have created designated Provider Care Teams.  These Care Teams include your primary Cardiologist (physician) and Advanced Practice Providers (APPs -  Physician Assistants and Nurse Practitioners) who all work together to provide you with the care you need, when you need it. You will need a follow up appointment in 2 weeks.  You may see Kathlyn Sacramento, MD or one of the following Advanced Practice Providers on your designated Care Team:   Murray Hodgkins, NP Christell Faith, PA-C . Marrianne Mood, PA-C

## 2018-12-03 NOTE — Progress Notes (Signed)
Cardiology Clinic Note   Patient Name: [name] [name] Date of Encounter: 12/03/2018  Primary Care Provider:  Venita Lick, NP Primary Cardiologist:  Kathlyn Sacramento, MD  Patient Profile    37 y/o ? with a history of coronary artery disease status post prior circumflex and obtuse marginal stenting, ischemic cardiomyopathy with subsequent normalization of LV function, type 2 diabetes mellitus, stage II-III chronic kidney disease, diabetic gastroparesis, hypertension, and hyperlipidemia, who presents for follow-up related to angina and dyspnea.  Past Medical History    Past Medical History:  Diagnosis Date  . CAD (coronary artery disease)    a. 03/2015 NSTEMI/PCI: LCX 13m (DES), OM1 100p (DES - vessel labeled Ramus in subsequent cath report). Minor irregs to LAD/RCA; b. 04/2015 Cath: LM nl, LAD 40p, RI patent stent, LCX patent stent, RCA nl, EF 55-65%; c. 02/2016 MV: basal inferolateral and mid inferolateral defect/infarct w/o ischemia. EF 55-65%. Low risk.  . Chronic diastolic (congestive) heart failure ([place])    a. 03/2015 Echo: EF 30-35%, mild concentric LVH, severe HK of inf, inflat, & lat walls, mod MR, mild TR;  b. 04/2015 LV Gram: EF 55-65%; c. 09/2017 Echo: EF 55-60%, no rwma. Mild MR; d. 11/2018 Echo: Ef 60-65%, no rwma.  . CKD (chronic kidney disease), stage III ([place])   . Diabetes type 2, controlled ([place])    a. since 2002   . Diabetic gastroparesis ([facility])    a. chronic nausea/vomiting.  . Diverticulosis, sigmoid 07/2016  . Heavy menses    a. H/O IUD - expired in 2014 - remains in place.  . Hyperlipidemia   . Hypertension   . Iron deficiency anemia   . Ischemic cardiomyopathy (resolved)    a. 03/2015 EF 30-35% post NSTEMI;  b. 04/2015 EF 55-65% on LV gram; c. 09/2017 Echo: EF 55-60%; d. 11/2018 Echo: Ef 60-65%.  . Obesity   . Tobacco abuse    a. quit 03/2015.  Marland Kitchen Uterine fibroid   . Vertigo   . Vitamin D deficiency    Past Surgical History:  Procedure Laterality Date  .  APPENDECTOMY    . CARDIAC CATHETERIZATION  4/16   x2 stent [facility]  . CARDIAC CATHETERIZATION N/A 05/05/2015   Procedure: Left Heart Cath and Coronary Angiography;  Surgeon: Peter M Martinique, MD;  Location: [place] [facility];  Service: Cardiovascular;  Laterality: N/A;  . COLONOSCOPY WITH PROPOFOL N/A 07/31/2016   Procedure: COLONOSCOPY WITH PROPOFOL;  Surgeon: Lollie Sails, MD;  Location: [facility] ENDOSCOPY;  Service: Endoscopy;  Laterality: N/A;  . ESOPHAGOGASTRODUODENOSCOPY (EGD) WITH PROPOFOL N/A 07/31/2016   Procedure: ESOPHAGOGASTRODUODENOSCOPY (EGD) WITH PROPOFOL;  Surgeon: Lollie Sails, MD;  Location: [facility]COPY;  Service: Endoscopy;  Laterality: N/A;  . LAPAROSCOPIC APPENDECTOMY N/A 08/07/2016   Procedure: APPENDECTOMY LAPAROSCOPIC;  Surgeon: Hubbard Robinson, MD;  Location: [facility] ORS;  Service: General;  Laterality: N/A;  . MOUTH SURGERY      Allergies  Allergies  Allergen Reactions  . Lisinopril Anaphylaxis, Itching and Swelling  . Rosemary Oil Anaphylaxis  . Shellfish Allergy Itching and Swelling  . Other     [place] seasoning  . Metformin And Related Diarrhea, Nausea And Vomiting and Rash    History of Present Illness    37 year old female with the above complex past medical history including type 2 diabetes mellitus which was diagnosed 2002, diabetic gastroparesis with chronic nausea and vomiting, coronary artery disease, ischemic cardiomyopathy with subsequent normalization of LV function, stage II-III chronic kidney disease, hypertension, hyperlipidemia,  and obesity.  Cardiac history dates back to April 2016, when she was hospitalized with chest pain and non-STEMI.  She was found to have LV dysfunction with an EF of 30 to 35%, and underwent diagnostic catheterization revealing severe left circumflex and OM1 disease.  Both areas were successfully treated with drug-eluting stents.  Follow-up catheterization in May 2016 revealed patency of both stents with normalization of  LV function by ventriculography.  Of note, there is a report from May 2016, which refers to the obtuse marginal stent as a ramus intermedius stent.  Stress testing was performed in March 2017 which was nonischemic and low risk.  Ms. Kohlman has a long history of intermittent chest discomfort occurring 2-3 times per week over several years.  I saw her in clinic on December 3, at which time she noted an increase in dyspnea on exertion as well as more frequent rest and exertional chest discomfort, occurring most days of the week, lasting 15 to 30 minutes, and resolving spontaneously.  Her weight was up 28 pounds since her July visit despite reporting reduced oral intake.  She was only mildly volume overloaded on exam and low-dose Lasix was added.  Renal function was stable that day.  She did not show for a follow-up basic metabolic panel.  An echocardiogram showed normal LV function-60 to 65%.  She notes improvement in lower extremity edema since starting low-dose Lasix but has continued to have significant dyspnea on exertion as well as almost daily rest and exertional chest discomfort, lasting 15 to 30 minutes, and resolving spontaneously or with rest.  She denies PND, orthopnea, dizziness, syncope, or early satiety.  She is interested in pursuing diagnostic catheterization.  Home Medications    Prior to Admission medications   Medication Sig Start Date End Date Taking? Authorizing Provider  albuterol (PROVENTIL HFA;VENTOLIN HFA) 108 (90 Base) MCG/ACT inhaler Inhale 2 puffs into the lungs every 6 (six) hours as needed for wheezing or shortness of breath. 02/19/17  Yes Volney American, PA-C  aspirin EC 81 MG tablet Take 81 mg by mouth daily.   Yes [provider]  benzonatate (TESSALON) 100 MG capsule Take 1 capsule (100 mg total) by mouth 2 (two) times daily as needed for cough. 01/07/18  Yes Kathrine Haddock, NP  Cholecalciferol 2000 units TABS Take 1 tablet by mouth daily.   Yes [provider]  DULoxetine (CYMBALTA) 30 MG capsule TAKE 1 CAPSULE BY MOUTH DAILY FOR 1 WEEK THEN TAKE 2 CAPSULES BY MOUTH DAILY THEREAFTER 07/01/18  Yes [provider]  etonogestrel (NEXPLANON) 68 MG IMPL implant 1 each by Subdermal route once.   Yes [provider]  FARXIGA 10 MG TABS tablet TAKE 1 TABLET BY MOUTH ONCE DAILY IN THE MORNING 09/08/18  Yes [provider]  ferrous sulfate 325 (65 FE) MG tablet Take 1 tablet (325 mg total) by mouth daily. 05/04/18  Yes Wieting, Richard, MD  furosemide (LASIX) 20 MG tablet Take 1 tablet (20 mg total) by mouth daily. 11/10/18 02/08/19 Yes Theora Gianotti, NP  glimepiride (AMARYL) 4 MG tablet TAKE 1 TABLET BY MOUTH ONCE DAILY 10/21/18  Yes Marnee Guarneri T, NP  glucose blood (ACCU-CHEK ACTIVE STRIPS) test strip Use as instructed 02/11/18  Yes Volney American, PA-C  glucose blood (ACCU-CHEK AVIVA) test strip 1 each by Other route as needed for other. Use as instructed   Yes [provider]  insulin aspart (NOVOLOG) 100 UNIT/ML injection Inject 10 Units into the skin  3 (three) times daily with meals. 05/04/18  Yes Wieting, Richard, MD  insulin glargine (LANTUS) 100 UNIT/ML injection Inject 0.4 mLs (40 Units total) into the skin at bedtime. 05/04/18  Yes Wieting, Richard, MD  Insulin Pen Needle 32G X 4 MM MISC 1 Units by Does not apply route every morning. Pen needles 08/27/17  Yes Kathrine Haddock, NP  isosorbide mononitrate (IMDUR) 30 MG 24 hr tablet TAKE 1 TABLET BY MOUTH ONCE DAILY 09/09/18  Yes Theora Gianotti, NP  meclizine (ANTIVERT) 32 MG tablet Take 1 tablet (32 mg total) by mouth 3 (three) times daily as needed. Patient taking differently: Take 32 mg by mouth 3 (three) times daily as needed for dizziness or nausea.  06/21/16  Yes Kathrine Haddock, NP  metoprolol tartrate (LOPRESSOR) 50 MG tablet Take 1 tablet (50 mg total) by mouth 2 (two) times daily. 06/09/18  Yes Theora Gianotti, NP    mirtazapine (REMERON) 30 MG tablet Take 30 mg by mouth at bedtime. 08/12/18  Yes [provider]  nitroGLYCERIN (NITROSTAT) 0.4 MG SL tablet Take 1 tab under your tongue while sitting for chest pain, If no relief may repeat, one tab every 5 min up to 3 tabs total over 15 mins. 06/09/18  Yes Theora Gianotti, NP  pantoprazole (PROTONIX) 40 MG tablet TAKE 1 TABLET BY MOUTH ONCE DAILY AT 6AM 10/21/18  Yes Cannady, Jolene T, NP  rosuvastatin (CRESTOR) 5 MG tablet TAKE ONE TABLET BY MOUTH ONCE DAILY 06/02/17  Yes Wellington Hampshire, MD    Family History    Family History  Problem Relation Age of Onset  . Diabetes Father   . Cancer Maternal Grandmother        lung  . Cancer Maternal Grandfather        prostate  . Diabetes Paternal Grandfather   . Diabetes Paternal Grandmother   . Cancer - Cervical Maternal Aunt    She indicated that her mother is alive. She indicated that her father is alive. She indicated that her sister is alive. She indicated that all of her three brothers are alive. She indicated that her maternal grandmother is deceased. She indicated that her maternal grandfather is deceased. She indicated that her paternal grandmother is deceased. She indicated that her paternal grandfather is deceased. She indicated that her daughter is alive. She indicated that her maternal aunt is deceased.  Social History    Social History   Socioeconomic History  . Marital status: Single    Spouse name: Not on file  . Number of children: Not on file  . Years of education: Not on file  . Highest education level: Not on file  Occupational History  . Not on file  Social Needs  . Financial resource strain: Not on file  . Food insecurity:    Worry: Not on file    Inability: Not on file  . Transportation needs:    Medical: Not on file    Non-medical: Not on file  Tobacco Use  . Smoking status: Former Smoker    Years: 15.00    Last attempt to quit: 03/10/2015    Years since  quitting: 3.7  . Smokeless tobacco: Never Used  Substance and Sexual Activity  . Alcohol use: No    Alcohol/week: 0.0 standard drinks  . Drug use: No  . Sexual activity: Yes    Birth control/protection: Implant  Lifestyle  . Physical activity:    Days per week: Not on file  Minutes per session: Not on file  . Stress: Not on file  Relationships  . Social connections:    Talks on phone: Not on file    Gets together: Not on file    Attends religious service: Not on file    Active member of club or organization: Not on file    Attends meetings of clubs or organizations: Not on file    Relationship status: Not on file  . Intimate partner violence:    Fear of current or ex partner: Not on file    Emotionally abused: Not on file    Physically abused: Not on file    Forced sexual activity: Not on file  Other Topics Concern  . Not on file  Social History Narrative   Lives locally with daughter and fiance.     Review of Systems    General:  No chills, fever, night sweats or weight changes.  Cardiovascular: +++ rest/exertional chest pain that has been increasing in freq and severity over the past few wks/months, +++ dyspnea on exertion, improved LE edema since starting lasix, no orthopnea, palpitations, paroxysmal nocturnal dyspnea. Dermatological: No rash, lesions/masses Respiratory: No cough, +++ dyspnea Urologic: No hematuria, dysuria Abdominal:   No nausea, vomiting, diarrhea, bright red blood per rectum, melena, or hematemesis Neurologic:  No visual changes, wkns, changes in mental status. All other systems reviewed and are otherwise negative except as noted above.  Physical Exam    VS:  BP 120/82   Pulse 76   Ht 5\' 5"  (1.651 m)   Wt 269 lb 12.8 oz (122.4 kg)   BMI 44.90 kg/m  , BMI Body mass index is 44.9 kg/m. GEN: Obese, in no acute distress. HEENT: normal. Neck: Supple, obese, difficult to gauge JVP, no carotid bruits, or masses. Cardiac: RRR, no murmurs, rubs,  or gallops. No clubbing, cyanosis, edema.  Radials/DP/PT 2+ and equal bilaterally.  Respiratory:  Respirations regular and unlabored, clear to auscultation bilaterally. GI: Obese, soft, nontender, nondistended, BS + x 4. MS: no deformity or atrophy. Skin: warm and dry, no rash. Neuro:  Strength and sensation are intact. Psych: Normal affect.  Accessory Clinical Findings    ECG personally reviewed by me today-regular sinus rhythm, 76, left axis - No acute changes  Assessment & Plan   1.  Coronary artery disease/unstable angina: Status post prior non-STEMI in 2016 with PCI of the obtuse marginal and circumflex.  She has had some degree of chronic rest and exertional chest pain since then with low risk Myoview in 2017 but over the past several weeks to months, she has noticed increasing frequency and severity of rest and exertional chest pain, lasting 15 to 30 minutes, associated with dyspnea, and resolving with rest.  When I saw her December 3, I obtained an echocardiogram which showed normal LV function.  She is continued to have chest pain and dyspnea with minimal activity.  We discussed options for management, to potentially include stress testing versus diagnostic catheterization.  I am concerned that with her body habitus, we will not have a high level of trust in her stress test result if normal, especially since she is likely to continue to have symptoms and therefore, we will plan to pursue diagnostic catheterization.  The patient understands that risks include but are not limited to stroke (1 in 1000), death (1 in 90), kidney failure [usually temporary] (1 in 500), bleeding (1 in 200), allergic reaction [possibly serious] (1 in 200), and agrees to proceed.  She  has stage II-III chronic kidney disease.  Follow-up basic metabolic panel and CBC now and I have asked her to hold Lasix.  Hydrate in AM. Continue aspirin, nitrate, beta-blocker, and statin.  2.  Chronic diastolic congestive heart  failure: As above, patient has been having increasing dyspnea and now 30 pound weight gain since July.  Start low-dose Lasix on her last visit and her volume is stable today.  Weight gain is far out of proportion to her volume status.  Recent echo showed normal LV function.  Heart rate and blood pressure stable.  I have asked her to hold her Lasix in the setting of pending diagnostic catheterization.  Follow-up basic metabolic panel today.  3.  Essential hypertension: Blood pressure stable on beta-blocker and nitrate.  4.  Hyperlipidemia: Continue low-dose Crestor.  LDL 163 in Oct, but she wasn't taking statin @ that time due to myalgias.  Tolerating crestor currently.  Will have to f/u labs in the future.  5.  CKD II-III:  F/u renal fxn today.  Hold lasix pre-cath.  Hydration pre-cath.  6.  DM II:  A1c 9.4 in Oct.  On insulin and followed by PCP.  7.  Morbid Obesity:  Contributing to poor exercise tolerance. If Cath ok, I will refer to dietician and wt loss clinic (pt and mother have agreed to this).  8.  Dispo:  F/u cbc, bmet today.  Cath tomorrow.  F/u in clinic in 2 wks.   Murray Hodgkins, NP 12/03/2018, 12:17 PM

## 2018-12-03 NOTE — Telephone Encounter (Signed)
Call to patient to discuss lab results that were taken today, pt verbalized understanding and is agreeable to POC>   Advised pt to call for any further questions or concerns

## 2018-12-03 NOTE — Telephone Encounter (Signed)
-----   Message from Theora Gianotti, NP sent at 12/03/2018  1:55 PM EST ----- Bun/creat up in setting of lasix.  Stop lasix and increase PO fluids today.  H/H stable.

## 2018-12-03 NOTE — H&P (View-Only) (Signed)
Cardiology Clinic Note   Patient Name: Zyia Kaneko Date of Encounter: 12/03/2018  Primary Care Provider:  Venita Lick, NP Primary Cardiologist:  Kathlyn Sacramento, MD  Patient Profile    37 y/o ? with a history of coronary artery disease status post prior circumflex and obtuse marginal stenting, ischemic cardiomyopathy with subsequent normalization of LV function, type 2 diabetes mellitus, stage II-III chronic kidney disease, diabetic gastroparesis, hypertension, and hyperlipidemia, who presents for follow-up related to angina and dyspnea.  Past Medical History    Past Medical History:  Diagnosis Date  . CAD (coronary artery disease)    a. 03/2015 NSTEMI/PCI: LCX 146m (DES), OM1 100p (DES - vessel labeled Ramus in subsequent cath report). Minor irregs to LAD/RCA; b. 04/2015 Cath: LM nl, LAD 40p, RI patent stent, LCX patent stent, RCA nl, EF 55-65%; c. 02/2016 MV: basal inferolateral and mid inferolateral defect/infarct w/o ischemia. EF 55-65%. Low risk.  . Chronic diastolic (congestive) heart failure ([place])    a. 03/2015 Echo: EF 30-35%, mild concentric LVH, severe HK of inf, inflat, & lat walls, mod MR, mild TR;  b. 04/2015 LV Gram: EF 55-65%; c. 09/2017 Echo: EF 55-60%, no rwma. Mild MR; d. 11/2018 Echo: Ef 60-65%, no rwma.  . CKD (chronic kidney disease), stage III ([place])   . Diabetes type 2, controlled ([place])    a. since 2002   . Diabetic gastroparesis ([facility])    a. chronic nausea/vomiting.  . Diverticulosis, sigmoid 07/2016  . Heavy menses    a. H/O IUD - expired in 2014 - remains in place.  . Hyperlipidemia   . Hypertension   . Iron deficiency anemia   . Ischemic cardiomyopathy (resolved)    a. 03/2015 EF 30-35% post NSTEMI;  b. 04/2015 EF 55-65% on LV gram; c. 09/2017 Echo: EF 55-60%; d. 11/2018 Echo: Ef 60-65%.  . Obesity   . Tobacco abuse    a. quit 03/2015.  Marland Kitchen Uterine fibroid   . Vertigo   . Vitamin D deficiency    Past Surgical History:  Procedure Laterality Date  .  APPENDECTOMY    . CARDIAC CATHETERIZATION  4/16   x2 stent [facility]  . CARDIAC CATHETERIZATION N/A 05/05/2015   Procedure: Left Heart Cath and Coronary Angiography;  Surgeon: Peter M Martinique, MD;  Location: [place] [facility];  Service: Cardiovascular;  Laterality: N/A;  . COLONOSCOPY WITH PROPOFOL N/A 07/31/2016   Procedure: COLONOSCOPY WITH PROPOFOL;  Surgeon: Lollie Sails, MD;  Location[facility] ENDOSCOPY;  Service: Endoscopy;  Laterality: N/A;  . ESOPHAGOGASTRODUODENOSCOPY (EGD) WITH PROPOFOL N/A 07/31/2016   Procedure: ESOPHAGOGASTRODUODENOSCOPY (EGD) WITH PROPOFOL;  Surgeon: Lollie Sails, MD;  Location: [facility] ENDOSCOPY;  Service: Endoscopy;  Laterality: N/A;  . LAPAROSCOPIC APPENDECTOMY N/A 08/07/2016   Procedure: APPENDECTOMY LAPAROSCOPIC;  Surgeon: Hubbard Robinson, MD;  Location: [facility] ORS;  Service: General;  Laterality: N/A;  . MOUTH SURGERY      Allergies  Allergies  Allergen Reactions  . Lisinopril Anaphylaxis, Itching and Swelling  . Rosemary Oil Anaphylaxis  . Shellfish Allergy Itching and Swelling  . Other     [place] seasoning  . Metformin And Related Diarrhea, Nausea And Vomiting and Rash    History of Present Illness    37 year old female with the above complex past medical history including type 2 diabetes mellitus which was diagnosed 2002, diabetic gastroparesis with chronic nausea and vomiting, coronary artery disease, ischemic cardiomyopathy with subsequent normalization of LV function, stage II-III chronic kidney disease, hypertension, hyperlipidemia,  and obesity.  Cardiac history dates back to April 2016, when she was hospitalized with chest pain and non-STEMI.  She was found to have LV dysfunction with an EF of 30 to 35%, and underwent diagnostic catheterization revealing severe left circumflex and OM1 disease.  Both areas were successfully treated with drug-eluting stents.  Follow-up catheterization in May 2016 revealed patency of both stents with normalization of  LV function by ventriculography.  Of note, there is a report from May 2016, which refers to the obtuse marginal stent as a ramus intermedius stent.  Stress testing was performed in March 2017 which was nonischemic and low risk.  Ms. Rapozo has a long history of intermittent chest discomfort occurring 2-3 times per week over several years.  I saw her in clinic on December 3, at which time she noted an increase in dyspnea on exertion as well as more frequent rest and exertional chest discomfort, occurring most days of the week, lasting 15 to 30 minutes, and resolving spontaneously.  Her weight was up 28 pounds since her July visit despite reporting reduced oral intake.  She was only mildly volume overloaded on exam and low-dose Lasix was added.  Renal function was stable that day.  She did not show for a follow-up basic metabolic panel.  An echocardiogram showed normal LV function-60 to 65%.  She notes improvement in lower extremity edema since starting low-dose Lasix but has continued to have significant dyspnea on exertion as well as almost daily rest and exertional chest discomfort, lasting 15 to 30 minutes, and resolving spontaneously or with rest.  She denies PND, orthopnea, dizziness, syncope, or early satiety.  She is interested in pursuing diagnostic catheterization.  Home Medications    Prior to Admission medications   Medication Sig Start Date End Date Taking? Authorizing Provider  albuterol (PROVENTIL HFA;VENTOLIN HFA) 108 (90 Base) MCG/ACT inhaler Inhale 2 puffs into the lungs every 6 (six) hours as needed for wheezing or shortness of breath. 02/19/17  Yes Volney American, PA-C  aspirin EC 81 MG tablet Take 81 mg by mouth daily.   Yes [provider]  benzonatate (TESSALON) 100 MG capsule Take 1 capsule (100 mg total) by mouth 2 (two) times daily as needed for cough. 01/07/18  Yes Kathrine Haddock, NP  Cholecalciferol 2000 units TABS Take 1 tablet by mouth daily.   Yes [provider]  DULoxetine (CYMBALTA) 30 MG capsule TAKE 1 CAPSULE BY MOUTH DAILY FOR 1 WEEK THEN TAKE 2 CAPSULES BY MOUTH DAILY THEREAFTER 07/01/18  Yes [provider]  etonogestrel (NEXPLANON) 68 MG IMPL implant 1 each by Subdermal route once.   Yes [provider]  FARXIGA 10 MG TABS tablet TAKE 1 TABLET BY MOUTH ONCE DAILY IN THE MORNING 09/08/18  Yes [provider]  ferrous sulfate 325 (65 FE) MG tablet Take 1 tablet (325 mg total) by mouth daily. 05/04/18  Yes Wieting, Richard, MD  furosemide (LASIX) 20 MG tablet Take 1 tablet (20 mg total) by mouth daily. 11/10/18 02/08/19 Yes Theora Gianotti, NP  glimepiride (AMARYL) 4 MG tablet TAKE 1 TABLET BY MOUTH ONCE DAILY 10/21/18  Yes Marnee Guarneri T, NP  glucose blood (ACCU-CHEK ACTIVE STRIPS) test strip Use as instructed 02/11/18  Yes Volney American, PA-C  glucose blood (ACCU-CHEK AVIVA) test strip 1 each by Other route as needed for other. Use as instructed   Yes [provider]  insulin aspart (NOVOLOG) 100 UNIT/ML injection Inject 10 Units into the skin  3 (three) times daily with meals. 05/04/18  Yes Wieting, Richard, MD  insulin glargine (LANTUS) 100 UNIT/ML injection Inject 0.4 mLs (40 Units total) into the skin at bedtime. 05/04/18  Yes Wieting, Richard, MD  Insulin Pen Needle 32G X 4 MM MISC 1 Units by Does not apply route every morning. Pen needles 08/27/17  Yes Kathrine Haddock, NP  isosorbide mononitrate (IMDUR) 30 MG 24 hr tablet TAKE 1 TABLET BY MOUTH ONCE DAILY 09/09/18  Yes Theora Gianotti, NP  meclizine (ANTIVERT) 32 MG tablet Take 1 tablet (32 mg total) by mouth 3 (three) times daily as needed. Patient taking differently: Take 32 mg by mouth 3 (three) times daily as needed for dizziness or nausea.  06/21/16  Yes Kathrine Haddock, NP  metoprolol tartrate (LOPRESSOR) 50 MG tablet Take 1 tablet (50 mg total) by mouth 2 (two) times daily. 06/09/18  Yes Theora Gianotti, NP    mirtazapine (REMERON) 30 MG tablet Take 30 mg by mouth at bedtime. 08/12/18  Yes [provider]  nitroGLYCERIN (NITROSTAT) 0.4 MG SL tablet Take 1 tab under your tongue while sitting for chest pain, If no relief may repeat, one tab every 5 min up to 3 tabs total over 15 mins. 06/09/18  Yes Theora Gianotti, NP  pantoprazole (PROTONIX) 40 MG tablet TAKE 1 TABLET BY MOUTH ONCE DAILY AT 6AM 10/21/18  Yes Cannady, Jolene T, NP  rosuvastatin (CRESTOR) 5 MG tablet TAKE ONE TABLET BY MOUTH ONCE DAILY 06/02/17  Yes Wellington Hampshire, MD    Family History    Family History  Problem Relation Age of Onset  . Diabetes Father   . Cancer Maternal Grandmother        lung  . Cancer Maternal Grandfather        prostate  . Diabetes Paternal Grandfather   . Diabetes Paternal Grandmother   . Cancer - Cervical Maternal Aunt    She indicated that her mother is alive. She indicated that her father is alive. She indicated that her sister is alive. She indicated that all of her three brothers are alive. She indicated that her maternal grandmother is deceased. She indicated that her maternal grandfather is deceased. She indicated that her paternal grandmother is deceased. She indicated that her paternal grandfather is deceased. She indicated that her daughter is alive. She indicated that her maternal aunt is deceased.  Social History    Social History   Socioeconomic History  . Marital status: Single    Spouse name: Not on file  . Number of children: Not on file  . Years of education: Not on file  . Highest education level: Not on file  Occupational History  . Not on file  Social Needs  . Financial resource strain: Not on file  . Food insecurity:    Worry: Not on file    Inability: Not on file  . Transportation needs:    Medical: Not on file    Non-medical: Not on file  Tobacco Use  . Smoking status: Former Smoker    Years: 15.00    Last attempt to quit: 03/10/2015    Years since  quitting: 3.7  . Smokeless tobacco: Never Used  Substance and Sexual Activity  . Alcohol use: No    Alcohol/week: 0.0 standard drinks  . Drug use: No  . Sexual activity: Yes    Birth control/protection: Implant  Lifestyle  . Physical activity:    Days per week: Not on file  Minutes per session: Not on file  . Stress: Not on file  Relationships  . Social connections:    Talks on phone: Not on file    Gets together: Not on file    Attends religious service: Not on file    Active member of club or organization: Not on file    Attends meetings of clubs or organizations: Not on file    Relationship status: Not on file  . Intimate partner violence:    Fear of current or ex partner: Not on file    Emotionally abused: Not on file    Physically abused: Not on file    Forced sexual activity: Not on file  Other Topics Concern  . Not on file  Social History Narrative   Lives locally with daughter and fiance.     Review of Systems    General:  No chills, fever, night sweats or weight changes.  Cardiovascular: +++ rest/exertional chest pain that has been increasing in freq and severity over the past few wks/months, +++ dyspnea on exertion, improved LE edema since starting lasix, no orthopnea, palpitations, paroxysmal nocturnal dyspnea. Dermatological: No rash, lesions/masses Respiratory: No cough, +++ dyspnea Urologic: No hematuria, dysuria Abdominal:   No nausea, vomiting, diarrhea, bright red blood per rectum, melena, or hematemesis Neurologic:  No visual changes, wkns, changes in mental status. All other systems reviewed and are otherwise negative except as noted above.  Physical Exam    VS:  BP 120/82   Pulse 76   Ht 5\' 5"  (1.651 m)   Wt 269 lb 12.8 oz (122.4 kg)   BMI 44.90 kg/m  , BMI Body mass index is 44.9 kg/m. GEN: Obese, in no acute distress. HEENT: normal. Neck: Supple, obese, difficult to gauge JVP, no carotid bruits, or masses. Cardiac: RRR, no murmurs, rubs,  or gallops. No clubbing, cyanosis, edema.  Radials/DP/PT 2+ and equal bilaterally.  Respiratory:  Respirations regular and unlabored, clear to auscultation bilaterally. GI: Obese, soft, nontender, nondistended, BS + x 4. MS: no deformity or atrophy. Skin: warm and dry, no rash. Neuro:  Strength and sensation are intact. Psych: Normal affect.  Accessory Clinical Findings    ECG personally reviewed by me today-regular sinus rhythm, 76, left axis - No acute changes  Assessment & Plan   1.  Coronary artery disease/unstable angina: Status post prior non-STEMI in 2016 with PCI of the obtuse marginal and circumflex.  She has had some degree of chronic rest and exertional chest pain since then with low risk Myoview in 2017 but over the past several weeks to months, she has noticed increasing frequency and severity of rest and exertional chest pain, lasting 15 to 30 minutes, associated with dyspnea, and resolving with rest.  When I saw her December 3, I obtained an echocardiogram which showed normal LV function.  She is continued to have chest pain and dyspnea with minimal activity.  We discussed options for management, to potentially include stress testing versus diagnostic catheterization.  I am concerned that with her body habitus, we will not have a high level of trust in her stress test result if normal, especially since she is likely to continue to have symptoms and therefore, we will plan to pursue diagnostic catheterization.  The patient understands that risks include but are not limited to stroke (1 in 1000), death (1 in 65), kidney failure [usually temporary] (1 in 500), bleeding (1 in 200), allergic reaction [possibly serious] (1 in 200), and agrees to proceed.  She  has stage II-III chronic kidney disease.  Follow-up basic metabolic panel and CBC now and I have asked her to hold Lasix.  Hydrate in AM. Continue aspirin, nitrate, beta-blocker, and statin.  2.  Chronic diastolic congestive heart  failure: As above, patient has been having increasing dyspnea and now 30 pound weight gain since July.  Start low-dose Lasix on her last visit and her volume is stable today.  Weight gain is far out of proportion to her volume status.  Recent echo showed normal LV function.  Heart rate and blood pressure stable.  I have asked her to hold her Lasix in the setting of pending diagnostic catheterization.  Follow-up basic metabolic panel today.  3.  Essential hypertension: Blood pressure stable on beta-blocker and nitrate.  4.  Hyperlipidemia: Continue low-dose Crestor.  LDL 163 in Oct, but she wasn't taking statin @ that time due to myalgias.  Tolerating crestor currently.  Will have to f/u labs in the future.  5.  CKD II-III:  F/u renal fxn today.  Hold lasix pre-cath.  Hydration pre-cath.  6.  DM II:  A1c 9.4 in Oct.  On insulin and followed by PCP.  7.  Morbid Obesity:  Contributing to poor exercise tolerance. If Cath ok, I will refer to dietician and wt loss clinic (pt and mother have agreed to this).  8.  Dispo:  F/u cbc, bmet today.  Cath tomorrow.  F/u in clinic in 2 wks.   Murray Hodgkins, NP 12/03/2018, 12:17 PM

## 2018-12-04 ENCOUNTER — Encounter
Admission: RE | Disposition: A | Discharge status: Home / Self Care | Payer: Self-pay | Source: Home / Self Care | Attending: Cardiovascular Disease

## 2018-12-04 ENCOUNTER — Ambulatory Visit
Admission: RE | Admit: 2018-12-04 | Discharge: 2018-12-05 | Disposition: A | Discharge status: Home / Self Care | Payer: Medicaid Other | Attending: Cardiovascular Disease | Admitting: Cardiovascular Disease

## 2018-12-04 ENCOUNTER — Other Ambulatory Visit: Payer: Self-pay

## 2018-12-04 DIAGNOSIS — I200 Unstable angina: Secondary | ICD-10-CM | POA: Diagnosis present

## 2018-12-04 DIAGNOSIS — I25110 Atherosclerotic heart disease of native coronary artery with unstable angina pectoris: Secondary | ICD-10-CM | POA: Diagnosis not present

## 2018-12-04 DIAGNOSIS — D509 Iron deficiency anemia, unspecified: Secondary | ICD-10-CM | POA: Diagnosis not present

## 2018-12-04 DIAGNOSIS — E1169 Type 2 diabetes mellitus with other specified complication: Secondary | ICD-10-CM | POA: Diagnosis present

## 2018-12-04 DIAGNOSIS — Z794 Long term (current) use of insulin: Secondary | ICD-10-CM | POA: Insufficient documentation

## 2018-12-04 DIAGNOSIS — E785 Hyperlipidemia, unspecified: Secondary | ICD-10-CM | POA: Diagnosis not present

## 2018-12-04 DIAGNOSIS — E1143 Type 2 diabetes mellitus with diabetic autonomic (poly)neuropathy: Secondary | ICD-10-CM | POA: Insufficient documentation

## 2018-12-04 DIAGNOSIS — I2510 Atherosclerotic heart disease of native coronary artery without angina pectoris: Secondary | ICD-10-CM | POA: Diagnosis present

## 2018-12-04 DIAGNOSIS — I130 Hypertensive heart and chronic kidney disease with heart failure and stage 1 through stage 4 chronic kidney disease, or unspecified chronic kidney disease: Secondary | ICD-10-CM | POA: Insufficient documentation

## 2018-12-04 DIAGNOSIS — Z955 Presence of coronary angioplasty implant and graft: Secondary | ICD-10-CM | POA: Insufficient documentation

## 2018-12-04 DIAGNOSIS — I255 Ischemic cardiomyopathy: Secondary | ICD-10-CM | POA: Diagnosis present

## 2018-12-04 DIAGNOSIS — Z79899 Other long term (current) drug therapy: Secondary | ICD-10-CM | POA: Insufficient documentation

## 2018-12-04 DIAGNOSIS — I25119 Atherosclerotic heart disease of native coronary artery with unspecified angina pectoris: Secondary | ICD-10-CM

## 2018-12-04 DIAGNOSIS — N183 Chronic kidney disease, stage 3 unspecified: Secondary | ICD-10-CM | POA: Diagnosis present

## 2018-12-04 DIAGNOSIS — I252 Old myocardial infarction: Secondary | ICD-10-CM | POA: Diagnosis not present

## 2018-12-04 DIAGNOSIS — E1122 Type 2 diabetes mellitus with diabetic chronic kidney disease: Secondary | ICD-10-CM | POA: Insufficient documentation

## 2018-12-04 DIAGNOSIS — Z888 Allergy status to other drugs, medicaments and biological substances status: Secondary | ICD-10-CM | POA: Diagnosis not present

## 2018-12-04 DIAGNOSIS — E559 Vitamin D deficiency, unspecified: Secondary | ICD-10-CM | POA: Insufficient documentation

## 2018-12-04 DIAGNOSIS — Z6841 Body Mass Index (BMI) 40.0 and over, adult: Secondary | ICD-10-CM | POA: Insufficient documentation

## 2018-12-04 DIAGNOSIS — Z7982 Long term (current) use of aspirin: Secondary | ICD-10-CM | POA: Insufficient documentation

## 2018-12-04 DIAGNOSIS — E1165 Type 2 diabetes mellitus with hyperglycemia: Secondary | ICD-10-CM | POA: Diagnosis present

## 2018-12-04 DIAGNOSIS — I5032 Chronic diastolic (congestive) heart failure: Secondary | ICD-10-CM | POA: Insufficient documentation

## 2018-12-04 DIAGNOSIS — Z833 Family history of diabetes mellitus: Secondary | ICD-10-CM | POA: Insufficient documentation

## 2018-12-04 DIAGNOSIS — Z87891 Personal history of nicotine dependence: Secondary | ICD-10-CM | POA: Diagnosis not present

## 2018-12-04 DIAGNOSIS — E669 Obesity, unspecified: Secondary | ICD-10-CM | POA: Diagnosis present

## 2018-12-04 HISTORY — PX: INTRAVASCULAR PRESSURE WIRE/FFR STUDY: CATH118243

## 2018-12-04 HISTORY — PX: CORONARY BALLOON ANGIOPLASTY: CATH118233

## 2018-12-04 HISTORY — PX: LEFT HEART CATH AND CORONARY ANGIOGRAPHY: CATH118249

## 2018-12-04 LAB — GLUCOSE, CAPILLARY
Glucose-Capillary: 207 mg/dL — ABNORMAL HIGH (ref 70–99)
Glucose-Capillary: 106 mg/dL — ABNORMAL HIGH (ref 70–99)
Glucose-Capillary: 132 mg/dL — ABNORMAL HIGH (ref 70–99)
Glucose-Capillary: 166 mg/dL — ABNORMAL HIGH (ref 70–99)

## 2018-12-04 LAB — MRSA PCR SCREENING: MRSA by PCR: POSITIVE — AB

## 2018-12-04 SURGERY — LEFT HEART CATH AND CORONARY ANGIOGRAPHY
Anesthesia: Moderate Sedation

## 2018-12-04 MED ORDER — ONDANSETRON HCL 4 MG/2ML IJ SOLN
INTRAMUSCULAR | Status: AC
  Administered 2018-12-04: 4 mg via INTRAVENOUS
  Filled 2018-12-04: qty 2

## 2018-12-04 MED ORDER — NITROGLYCERIN 1 MG/10 ML FOR IR/CATH LAB
INTRA_ARTERIAL | Status: DC | PRN
  Administered 2018-12-04: 150 ug via INTRACORONARY

## 2018-12-04 MED ORDER — GLIMEPIRIDE 4 MG PO TABS
4.0000 mg | ORAL_TABLET | Freq: Every day | ORAL | Status: DC
  Administered 2018-12-05: 4 mg via ORAL
  Filled 2018-12-04 (×2): qty 1

## 2018-12-04 MED ORDER — FENTANYL CITRATE (PF) 100 MCG/2ML IJ SOLN
INTRAMUSCULAR | Status: DC | PRN
  Administered 2018-12-04 (×2): 50 ug via INTRAVENOUS

## 2018-12-04 MED ORDER — ASPIRIN 81 MG PO CHEW
CHEWABLE_TABLET | ORAL | Status: DC | PRN
  Administered 2018-12-04: 162 mg via ORAL

## 2018-12-04 MED ORDER — TICAGRELOR 90 MG PO TABS
ORAL_TABLET | ORAL | Status: AC
  Filled 2018-12-04: qty 2

## 2018-12-04 MED ORDER — SODIUM CHLORIDE 0.9 % IV SOLN
INTRAVENOUS | Status: AC
  Administered 2018-12-04: 18:00:00 via INTRAVENOUS

## 2018-12-04 MED ORDER — ISOSORBIDE MONONITRATE ER 30 MG PO TB24
30.0000 mg | ORAL_TABLET | Freq: Every day | ORAL | Status: DC
  Administered 2018-12-05: 30 mg via ORAL
  Filled 2018-12-04: qty 1

## 2018-12-04 MED ORDER — MIDAZOLAM HCL 2 MG/2ML IJ SOLN
INTRAMUSCULAR | Status: AC
  Filled 2018-12-04: qty 2

## 2018-12-04 MED ORDER — FUROSEMIDE 20 MG PO TABS
20.0000 mg | ORAL_TABLET | Freq: Every day | ORAL | Status: DC
  Filled 2018-12-04: qty 1

## 2018-12-04 MED ORDER — FERROUS SULFATE 325 (65 FE) MG PO TABS
325.0000 mg | ORAL_TABLET | Freq: Every day | ORAL | Status: DC
  Administered 2018-12-05: 325 mg via ORAL
  Filled 2018-12-04: qty 1

## 2018-12-04 MED ORDER — HEPARIN (PORCINE) IN NACL 1000-0.9 UT/500ML-% IV SOLN
INTRAVENOUS | Status: AC
  Filled 2018-12-04: qty 1000

## 2018-12-04 MED ORDER — SODIUM CHLORIDE 0.9% FLUSH: 3.0000 mL | Freq: Two times a day (BID) | INTRAVENOUS | Status: DC

## 2018-12-04 MED ORDER — SODIUM CHLORIDE 0.9 % IV SOLN: 250.0000 mL | INTRAVENOUS | Status: DC | PRN

## 2018-12-04 MED ORDER — INSULIN GLARGINE 100 UNIT/ML ~~LOC~~ SOLN
40.0000 [IU] | Freq: Every day | SUBCUTANEOUS | Status: DC
  Administered 2018-12-04: 40 [IU] via SUBCUTANEOUS
  Filled 2018-12-04 (×2): qty 0.4

## 2018-12-04 MED ORDER — HEPARIN SODIUM (PORCINE) 1000 UNIT/ML IJ SOLN
INTRAMUSCULAR | Status: AC
  Filled 2018-12-04: qty 1

## 2018-12-04 MED ORDER — ASPIRIN 81 MG PO CHEW: 81.0000 mg | CHEWABLE_TABLET | ORAL | Status: DC

## 2018-12-04 MED ORDER — IOPAMIDOL (ISOVUE-300) INJECTION 61%
INTRAVENOUS | Status: DC | PRN
  Administered 2018-12-04: 125 mL via INTRA_ARTERIAL

## 2018-12-04 MED ORDER — DULOXETINE HCL 30 MG PO CPEP
30.0000 mg | ORAL_CAPSULE | Freq: Every day | ORAL | Status: DC
  Administered 2018-12-05: 30 mg via ORAL
  Filled 2018-12-04: qty 1

## 2018-12-04 MED ORDER — VERAPAMIL HCL 2.5 MG/ML IV SOLN
INTRAVENOUS | Status: DC | PRN
  Administered 2018-12-04: 2.5 mg via INTRA_ARTERIAL

## 2018-12-04 MED ORDER — ALBUTEROL SULFATE (2.5 MG/3ML) 0.083% IN NEBU: 2.5000 mg | INHALATION_SOLUTION | Freq: Four times a day (QID) | RESPIRATORY_TRACT | Status: DC | PRN

## 2018-12-04 MED ORDER — SODIUM CHLORIDE 0.9% FLUSH
3.0000 mL | Freq: Two times a day (BID) | INTRAVENOUS | Status: DC
  Administered 2018-12-04: 3 mL via INTRAVENOUS

## 2018-12-04 MED ORDER — SODIUM CHLORIDE 0.9% FLUSH: 3.0000 mL | INTRAVENOUS | Status: DC | PRN

## 2018-12-04 MED ORDER — ETONOGESTREL 68 MG ~~LOC~~ IMPL
1.0000 | DRUG_IMPLANT | Freq: Once | SUBCUTANEOUS | Status: DC
  Filled 2018-12-04: qty 1

## 2018-12-04 MED ORDER — LABETALOL HCL 5 MG/ML IV SOLN: 10.0000 mg | INTRAVENOUS | Status: AC | PRN

## 2018-12-04 MED ORDER — VITAMIN D3 25 MCG (1000 UNIT) PO TABS
1000.0000 [IU] | ORAL_TABLET | Freq: Every day | ORAL | Status: DC
  Administered 2018-12-05: 1000 [IU] via ORAL
  Filled 2018-12-04 (×3): qty 1

## 2018-12-04 MED ORDER — METOPROLOL TARTRATE 50 MG PO TABS
50.0000 mg | ORAL_TABLET | Freq: Two times a day (BID) | ORAL | Status: DC
  Administered 2018-12-04 – 2018-12-05 (×2): 50 mg via ORAL
  Filled 2018-12-04 (×2): qty 1

## 2018-12-04 MED ORDER — PANTOPRAZOLE SODIUM 40 MG PO TBEC
40.0000 mg | DELAYED_RELEASE_TABLET | Freq: Every day | ORAL | Status: DC
  Administered 2018-12-05: 40 mg via ORAL
  Filled 2018-12-04: qty 1

## 2018-12-04 MED ORDER — ASPIRIN EC 81 MG PO TBEC
81.0000 mg | DELAYED_RELEASE_TABLET | Freq: Every day | ORAL | Status: DC
  Administered 2018-12-05: 81 mg via ORAL
  Filled 2018-12-04: qty 1

## 2018-12-04 MED ORDER — INSULIN ASPART 100 UNIT/ML ~~LOC~~ SOLN
10.0000 [IU] | Freq: Three times a day (TID) | SUBCUTANEOUS | Status: DC
  Administered 2018-12-04 – 2018-12-05 (×3): 10 [IU] via SUBCUTANEOUS
  Filled 2018-12-04 (×3): qty 1

## 2018-12-04 MED ORDER — VERAPAMIL HCL 2.5 MG/ML IV SOLN
INTRAVENOUS | Status: AC
  Filled 2018-12-04: qty 2

## 2018-12-04 MED ORDER — INSULIN PEN NEEDLE 32G X 4 MM MISC: 1.0000 [IU] | Freq: Every morning | Status: DC

## 2018-12-04 MED ORDER — ACETAMINOPHEN 325 MG PO TABS: 650.0000 mg | ORAL_TABLET | ORAL | Status: DC | PRN

## 2018-12-04 MED ORDER — NITROGLYCERIN 5 MG/ML IV SOLN
INTRAVENOUS | Status: AC
  Filled 2018-12-04: qty 10

## 2018-12-04 MED ORDER — BENZONATATE 100 MG PO CAPS: 100.0000 mg | ORAL_CAPSULE | Freq: Two times a day (BID) | ORAL | Status: DC | PRN

## 2018-12-04 MED ORDER — MIRTAZAPINE 30 MG PO TABS
30.0000 mg | ORAL_TABLET | Freq: Every day | ORAL | Status: DC
  Administered 2018-12-05: 30 mg via ORAL
  Filled 2018-12-04: qty 1
  Filled 2018-12-04: qty 2
  Filled 2018-12-04: qty 1

## 2018-12-04 MED ORDER — HEPARIN SODIUM (PORCINE) 1000 UNIT/ML IJ SOLN
INTRAMUSCULAR | Status: DC | PRN
  Administered 2018-12-04 (×2): 6000 [IU] via INTRAVENOUS

## 2018-12-04 MED ORDER — FENTANYL CITRATE (PF) 100 MCG/2ML IJ SOLN
INTRAMUSCULAR | Status: AC
  Filled 2018-12-04: qty 2

## 2018-12-04 MED ORDER — SODIUM CHLORIDE 0.9 % WEIGHT BASED INFUSION
3.0000 mL/kg/h | INTRAVENOUS | Status: DC
  Administered 2018-12-04: 3 mL/kg/h via INTRAVENOUS

## 2018-12-04 MED ORDER — ONDANSETRON HCL 4 MG/2ML IJ SOLN
4.0000 mg | Freq: Four times a day (QID) | INTRAMUSCULAR | Status: DC | PRN
  Administered 2018-12-04: 4 mg via INTRAVENOUS

## 2018-12-04 MED ORDER — MIDAZOLAM HCL 2 MG/2ML IJ SOLN
INTRAMUSCULAR | Status: DC | PRN
  Administered 2018-12-04 (×2): 1 mg via INTRAVENOUS

## 2018-12-04 MED ORDER — SODIUM CHLORIDE 0.9 % WEIGHT BASED INFUSION: 1.0000 mL/kg/h | INTRAVENOUS | Status: DC

## 2018-12-04 MED ORDER — ROSUVASTATIN CALCIUM 5 MG PO TABS
5.0000 mg | ORAL_TABLET | Freq: Every day | ORAL | Status: DC
  Administered 2018-12-04 – 2018-12-05 (×2): 5 mg via ORAL
  Filled 2018-12-04 (×2): qty 1

## 2018-12-04 MED ORDER — ASPIRIN 81 MG PO CHEW
CHEWABLE_TABLET | ORAL | Status: AC
  Filled 2018-12-04: qty 2

## 2018-12-04 MED ORDER — TICAGRELOR 90 MG PO TABS
90.0000 mg | ORAL_TABLET | Freq: Two times a day (BID) | ORAL | Status: DC
  Administered 2018-12-04 – 2018-12-05 (×2): 90 mg via ORAL
  Filled 2018-12-04 (×2): qty 1

## 2018-12-04 SURGICAL SUPPLY — 12 items
BALLN ~~LOC~~ TREK RX 3.0X15 (BALLOONS) ×4
BALLOON ~~LOC~~ TREK RX 3.0X15 (BALLOONS) ×2 IMPLANT
CATH EAGLE EYE PLAT IMAGING (CATHETERS) ×4 IMPLANT
CATH INFINITI 5FR JK (CATHETERS) ×12 IMPLANT
CATH LAUNCHER 6FR EBU3.5 (CATHETERS) ×4 IMPLANT
DEVICE INFLAT 30 PLUS (MISCELLANEOUS) ×4 IMPLANT
DEVICE RAD TR BAND REGULAR (VASCULAR PRODUCTS) ×4 IMPLANT
GLIDESHEATH SLEND SS 6F .021 (SHEATH) ×4 IMPLANT
KIT MANI 3VAL PERCEP (MISCELLANEOUS) ×4 IMPLANT
PACK CARDIAC CATH (CUSTOM PROCEDURE TRAY) ×4 IMPLANT
WIRE ROSEN-J .035X260CM (WIRE) ×4 IMPLANT
WIRE RUNTHROUGH .014X180CM (WIRE) ×4 IMPLANT

## 2018-12-04 NOTE — CV Procedure (Signed)
Cardiac catheterization showed significant two-vessel coronary artery disease with in-stent restenosis in the mid left circumflex and new stenosis in the mid RCA. Balloon angioplasty of the left circumflex was performed without stent placement.  Recommend, Dual antiplatelet therapy. Staged RCA PCI in 1 month.  This was not done given underlying chronic kidney disease. Full report to follow.

## 2018-12-04 NOTE — Discharge Summary (Addendum)
Discharge Summary    Patient ID: [name] [name]  MRN: [id number], DOB/AGE: 1981-09-20 37 y.o.  Admit Date: 12/04/2018 Discharge Date: 12/05/2018  Primary Care Provider: Venita Lick, NP Primary Cardiologist: Dr. 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 Lisinopril Anaphylaxis, Itching and Swelling  . Rosemary Oil Anaphylaxis  . Shellfish Allergy Itching and Swelling  . Other     [place] seasoning  . Metformin And Related Diarrhea, Nausea And Vomiting and Rash     History of Present Illness     37 year old female with history of CAD s/p prior LCx and OM stenting, chronic combined CHF/ICM with subsequent normalization of the LVSF, DM2 diagnosed in 2000, CKD stage II-III, anemia, diabetic gastroparesis, HTN, and HLD who was admitted following diagnostic LHC.   Cardiac history dates back to 03/2015, when she was admitted with chest pain and ruled in for a NSTEMI. She was found to have LV dysfunction with an EF of 30 to 35%, and underwent diagnostic cath that revealed severe LCx and OM1 disease. Both areas were successfully treated with PCI/DES. Follow-up cath in 04/2015, revealed patency of both stents with normalization of LV function by LV gram. Of note, there is a report from 04/2015, which refers to the obtuse marginal stent as a ramus intermedius stent. Stress testing was performed in 02/2016, which was nonischemic and low risk. She has had ongoing intermittent chest pain over the years, typically occurring 2-3 times per week. She was seen in the office on 11/10/2018 with a noted increase in [place] as well as more frequent exertional and rest chest discomfort. Her weight was up 28  pounds when compared to her 06/2018 visit, despite reporting reduced oral intake. Low-dose Lasix was added with stable renal function being noted. Her weight gain was felt to be far out of proportion to her volume status. She did not follow up with recommended repeat BMET.  Subsequent echo showed an EF of 60-65%. She was last seen in the office on 12/03/2018 and noted an improvement in her lower extremity edema since starting Lasix, but continued to note DOE and chest discomfort at rest and with exertion. Ischemic evaluation options were discussed with the patient with plan to proceed with diagnostic cardiac cath, scheduled for 12/04/2018. She was advised to hold her Lasix prior to her cath and was scheduled for pre-cath hydration. Pre-cath labs showed her renal function was slightly elevated in the setting of outpatient diuresis with a BUN.SCr trend of 23/1.26-->38/1.65.   Hospital Course     Consultants: Care manager   CAD involving the native coronary arteries with unstable angina: The patient presented to [facility] on 12/04/2018 for planned diagnostic LHC that showed significant two-vessel coronary disease with patent stent in the first obtuse marginal and a focal in-stent restenosis in the mid left circumflex at the origin of the OM 2.  She also had new significant disease in the proximal to mid RCA.  The left circumflex was successfully treated with balloon angioplasty.  This was guided by intravascular ultrasound and residual 20% stenosis was noted.  It is felt that she Nyasia Baxley require staged PCI to the right coronary  artery in approximately 1 month.  That procedure was not performed at this time secondary to stage III chronic kidney disease.   Post procedure, she has had no recurrent chest pain or dyspnea.  Renal function remained stable.  Though she was placed on Brilinta therapy yesterday, she has indicated this morning that she Medea Deines not be able to afford this and is already previously used an assistance  card.  In that setting, we Declan Adamson plan to load with Plavix this evening.  Chronic combined CHF with subsequent normalization of EF: Weight has trended up as an outpatient and was noted to be 30 pounds up at her visit on 12/03/2018 when compared to her visit in 06/2018. Her weight gain has been felt to be out of proportion to her volume status.  We have changed Lasix to as needed only at discharge given chronic kidney disease and rising creatinine with daily Lasix prior to catheterization.  HLD: LDL of 163 in 09/2018, not on a statin at that time secondary to myalgias. Goal LDL < 70. Currently tolerating Crestor. She Angeni Chaudhuri need outpatient lipid and liver function in follow up. If LDL remains above goal would recommend titration of Crestor, if she tolerates. If she does not tolerate titration of statin or addition of Zeita, would consider PCSK-9 inhibitor.   CKD stage II-III: Pre-cath SCr of 1.65 with a baseline ~ more recently of 1.2-1.5. Post-cath renal function 1.41. Recommend follow up bmet the following week to reassess renal function (message sent to our office for scheduling and contacting of the patient).   IDDM: Continue PTA insulin. Last A1c of 9.4 from 09/2018. Not on metformin. Managed by PCP.   Morbid obesity: Weight loss is advised.   Iron deficiency anemia: HGB stable. Outpatient follow up with PCP.    The patient's cath site has been examined is healing well without issues at this time. The patient has been seen by Dr. Curt Bears and felt to be stable for discharge today. All follow up appointments have been made. Discharge medications are listed below. Prescriptions have been reviewed with the patient and sent in to their pharmacy ([facility] on [address]).  _____________  Discharge Vitals Blood pressure (!) 155/84, pulse 78, temperature 98.3 F (36.8 C), temperature source Oral, resp. rate 16, height 5\' 5"  (1.651 m), weight 121.4 kg, SpO2 100 %.  Filed Weights   12/04/18 0820  12/04/18 1521 12/05/18 0508  Weight: 122.4 kg 125 kg 121.4 kg    Labs & Radiologic Studies    CBC Recent Labs    12/03/18 1311 12/05/18 0452  WBC 6.8 7.2  HGB 11.0* 10.0*  HCT 35.7* 32.7*  MCV 81.7 81.3  PLT 476* 242   Basic Metabolic Panel Recent Labs    12/03/18 1311 12/05/18 0452  NA 136 137  K 4.4 4.3  CL 108 113*  CO2 21* 18*  GLUCOSE 144* 161*  BUN 38* 28*  CREATININE 1.65* 1.41*  CALCIUM 9.0 8.4*   Diagnostic Studies/Procedures   LHC 12/04/2018: Left Main  Ost LM lesion 30% stenosed  Ost LM lesion is 30% stenosed.  Left Anterior Descending  Prox LAD lesion 30% stenosed  Prox LAD lesion is 30% stenosed.  Left Circumflex  Prox Cx to Mid Cx lesion 95% stenosed  Prox Cx to Mid Cx lesion is 95% stenosed. The lesion is type C. The lesion was previously treated using a drug eluting stent . Previously placed stent displays restenosis. Ultrasound (IVUS) was performed. Severe plaque burden was detected. By  IVUS, there was significant localized restenosis at the origin of OM 2. No evidence of stent fracture as was suggestive by fluoroscopy. Stent diameter distally was 2.9 mm and in the mid segment was between 2 and 2.5 mm.      **The mid circumflex was treated with PTCA only with reduction in plaque burden to 20%.  First Obtuse Marginal Branch  Ost 1st Mrg to 1st Mrg lesion 10% stenosed  Ost 1st Mrg to 1st Mrg lesion is 10% stenosed. The lesion was previously treated.  Second Obtuse Marginal Branch  Ost 2nd Mrg to 2nd Mrg lesion 40% stenosed  Ost 2nd Mrg to 2nd Mrg lesion is 40% stenosed.  Right Coronary Artery  Prox RCA to Mid RCA lesion 85% stenosed  Prox RCA to Mid RCA lesion is 85% stenosed.   _____________  Disposition   Pt is being discharged home today in good condition.  Follow-up Plans & Appointments    Follow-up Information    Rise Mu, PA-C Follow up on 12/17/2018.   Specialties:  Physician Assistant, Cardiology, Radiology Why:  Appointment  time 11 AM.  Contact information: [place] [id number] [phone]          Discharge Instructions    AMB Referral to Cardiac Rehabilitation - Phase II   Complete by:  As directed    Diagnosis:  Coronary Stents      Discharge Medications   Allergies as of 12/05/2018      Reactions   Lisinopril Anaphylaxis, Itching, Swelling   Rosemary Oil Anaphylaxis   Shellfish Allergy Itching, Swelling   Other    [place] seasoning   Metformin And Related Diarrhea, Nausea And Vomiting, Rash      Medication List    TAKE these medications   albuterol 108 (90 Base) MCG/ACT inhaler Commonly known as:  PROVENTIL HFA;VENTOLIN HFA Inhale 2 puffs into the lungs every 6 (six) hours as needed for wheezing or shortness of breath.   aspirin EC 81 MG tablet Take 81 mg by mouth daily.   benzonatate 100 MG capsule Commonly known as:  TESSALON Take 1 capsule (100 mg total) by mouth 2 (two) times daily as needed for cough.   Cholecalciferol 50 MCG (2000 UT) Tabs Take 1 tablet by mouth daily.   clopidogrel 75 MG tablet Commonly known as:  PLAVIX Take 4 tabs at 7pm tonight (12/28), then 1 tab daily beginning 12/29.   DULoxetine 30 MG capsule Commonly known as:  CYMBALTA TAKE 1 CAPSULE BY MOUTH DAILY FOR 1 WEEK THEN TAKE 2 CAPSULES BY MOUTH DAILY THEREAFTER   FARXIGA 10 MG Tabs tablet Generic drug:  dapagliflozin propanediol TAKE 1 TABLET BY MOUTH ONCE DAILY IN THE MORNING   ferrous sulfate 325 (65 FE) MG tablet Take 1 tablet (325 mg total) by mouth daily.   furosemide 20 MG tablet Commonly known as:  LASIX Take 1 tablet (20 mg total) by mouth daily as needed (wt gain of 2 lbs in 1 day or 5 lbs over the course of a week.). What changed:    when to take this  reasons to take this   glimepiride 4 MG tablet Commonly known as:  AMARYL TAKE 1 TABLET BY MOUTH ONCE DAILY   ACCU-CHEK AVIVA test strip Generic drug:  glucose blood 1 each by Other route as  needed for other. Use as instructed   glucose blood test strip Commonly known as:  ACCU-CHEK ACTIVE STRIPS Use as instructed  insulin aspart 100 UNIT/ML injection Commonly known as:  novoLOG Inject 10 Units into the skin 3 (three) times daily with meals.   insulin glargine 100 UNIT/ML injection Commonly known as:  LANTUS Inject 0.4 mLs (40 Units total) into the skin at bedtime.   Insulin Pen Needle 32G X 4 MM Misc 1 Units by Does not apply route every morning. Pen needles   isosorbide mononitrate 30 MG 24 hr tablet Commonly known as:  IMDUR TAKE 1 TABLET BY MOUTH ONCE DAILY   meclizine 32 MG tablet Commonly known as:  ANTIVERT Take 1 tablet (32 mg total) by mouth 3 (three) times daily as needed. What changed:  reasons to take this   metoprolol tartrate 50 MG tablet Commonly known as:  LOPRESSOR Take 1 tablet (50 mg total) by mouth 2 (two) times daily.   mirtazapine 30 MG tablet Commonly known as:  REMERON Take 30 mg by mouth at bedtime.   NEXPLANON 68 MG Impl implant Generic drug:  etonogestrel 1 each by Subdermal route once.   nitroGLYCERIN 0.4 MG SL tablet Commonly known as:  NITROSTAT Take 1 tab under your tongue while sitting for chest pain, If no relief may repeat, one tab every 5 min up to 3 tabs total over 15 mins.   pantoprazole 40 MG tablet Commonly known as:  PROTONIX TAKE 1 TABLET BY MOUTH ONCE DAILY AT 6AM   rosuvastatin 5 MG tablet Commonly known as:  CRESTOR TAKE ONE TABLET BY MOUTH ONCE DAILY       Aspirin prescribed at discharge?  Yes High Intensity Statin Prescribed? (Lipitor 40-80mg  or Crestor 20-40mg ): Yes Beta Blocker Prescribed? Yes For EF <40%, was ACEI/ARB Prescribed? No: EF > 40% ADP Receptor Inhibitor Prescribed? (i.e. Plavix etc.-Includes Medically Managed Patients): Yes For EF <40%, Aldosterone Inhibitor Prescribed? No: EF > 40% Was EF assessed during THIS hospitalization? No: recent echo and CKD Was Cardiac Rehab II ordered?  (Included Medically managed Patients): Yes   Outstanding Labs/Studies   None.   Duration of Discharge Encounter   Greater than 30 minutes including physician time.  Signed, Murray Hodgkins, NP [facility] Pager: [phone] 12/05/2018, 11:25 AM  I have seen and examined this patient with Ignacia Bayley.  Agree with above, note added to reflect my findings.  On exam, RRR, no murmurs, lungs clear.  Patient admitted for left heart catheterization.  Had balloon angioplasty of her circumflex.  Was also found to have significant RCA disease.  Plan for discharge today with follow-up in clinic.  She Amaiah Cristiano need staged stenting of her RCA.  This was not done due to significant kidney disease.  Discharged on aspirin and Plavix.  Rheda Kassab M. Annalia Metzger MD 12/05/2018 11:32 AM

## 2018-12-04 NOTE — Interval H&P Note (Signed)
History and Physical Interval Note:  12/04/2018 9:38 AM  [name] [name]  has presented today for surgery, with the diagnosis of LT Cath    Unstable angina  The various methods of treatment have been discussed with the patient and family. After consideration of risks, benefits and other options for treatment, the patient has consented to  Procedure(s): LEFT HEART CATH AND CORONARY ANGIOGRAPHY (Left) as a surgical intervention .  The patient's history has been reviewed, patient examined, no change in status, stable for surgery.  I have reviewed the patient's chart and labs.  Questions were answered to the patient's satisfaction.     Kathlyn Sacramento

## 2018-12-04 NOTE — Progress Notes (Signed)
Patients radial site oozing, dressing saturated, specials RN to bedside and held pressure/changed dressing and applied armboard, no current bleeding.

## 2018-12-04 NOTE — Progress Notes (Signed)
Notified Dr. Fletcher Anon of patient's shellfish allergy. He is aware and does not want to treat it.

## 2018-12-05 ENCOUNTER — Ambulatory Visit: Payer: Self-pay | Admitting: *Deleted

## 2018-12-05 ENCOUNTER — Encounter: Payer: Self-pay | Admitting: Nurse Practitioner

## 2018-12-05 DIAGNOSIS — I200 Unstable angina: Secondary | ICD-10-CM | POA: Diagnosis not present

## 2018-12-05 DIAGNOSIS — I25110 Atherosclerotic heart disease of native coronary artery with unstable angina pectoris: Secondary | ICD-10-CM | POA: Diagnosis not present

## 2018-12-05 DIAGNOSIS — I25111 Atherosclerotic heart disease of native coronary artery with angina pectoris with documented spasm: Secondary | ICD-10-CM

## 2018-12-05 DIAGNOSIS — Z9861 Coronary angioplasty status: Secondary | ICD-10-CM

## 2018-12-05 LAB — BASIC METABOLIC PANEL
Anion gap: 6 (ref 5–15)
BUN: 28 mg/dL — ABNORMAL HIGH (ref 6–20)
CO2: 18 mmol/L — ABNORMAL LOW (ref 22–32)
Calcium: 8.4 mg/dL — ABNORMAL LOW (ref 8.9–10.3)
Chloride: 113 mmol/L — ABNORMAL HIGH (ref 98–111)
Creatinine, Ser: 1.41 mg/dL — ABNORMAL HIGH (ref 0.44–1.00)
GFR calc Af Amer: 55 mL/min — ABNORMAL LOW (ref >60)
GFR calc non Af Amer: 47 mL/min — ABNORMAL LOW (ref >60)
Glucose, Bld: 161 mg/dL — ABNORMAL HIGH (ref 70–99)
Potassium: 4.3 mmol/L (ref 3.5–5.1)
Sodium: 137 mmol/L (ref 135–145)

## 2018-12-05 LAB — CBC
HCT: 32.7 % — ABNORMAL LOW (ref 36.0–46.0)
Hemoglobin: 10 g/dL — ABNORMAL LOW (ref 12.0–15.0)
MCH: 24.9 pg — ABNORMAL LOW (ref 26.0–34.0)
MCHC: 30.6 g/dL (ref 30.0–36.0)
MCV: 81.3 fL (ref 80.0–100.0)
Platelets: 392 10*3/uL (ref 150–400)
RBC: 4.02 MIL/uL (ref 3.87–5.11)
RDW: 16.6 % — ABNORMAL HIGH (ref 11.5–15.5)
WBC: 7.2 10*3/uL (ref 4.0–10.5)
nRBC: 0 % (ref 0.0–0.2)

## 2018-12-05 LAB — GLUCOSE, CAPILLARY: Glucose-Capillary: 150 mg/dL — ABNORMAL HIGH (ref 70–99)

## 2018-12-05 MED ORDER — FUROSEMIDE 20 MG PO TABS: 20.0000 mg | ORAL_TABLET | Freq: Every day | ORAL | 3 refills | Status: DC | PRN

## 2018-12-05 MED ORDER — CLOPIDOGREL BISULFATE 75 MG PO TABS: ORAL_TABLET | ORAL | 3 refills | Status: DC

## 2018-12-05 NOTE — Progress Notes (Signed)
See encounter note.

## 2018-12-05 NOTE — Discharge Instructions (Signed)
**PLEASE REMEMBER TO BRING ALL OF YOUR MEDICATIONS TO EACH OF YOUR FOLLOW-UP OFFICE VISITS. NO HEAVY LIFTING OR SEXUAL ACTIVITY X 7 DAYS. NO DRIVING X 3-5 DAYS. NO SOAKING BATHS, HOT TUBS, POOLS, ETC., X 7 DAYS.   Radial Site Care Refer to this sheet in the next few weeks. These instructions provide you with information on caring for yourself after your procedure. Your caregiver may also give you more specific instructions. Your treatment has been planned according to current medical practices, but problems sometimes occur. Call your caregiver if you have any problems or questions after your procedure. HOME CARE INSTRUCTIONS  You may shower the day after the procedure.Remove the bandage (dressing) and gently wash the site with plain soap and water.Gently pat the site dry.   Do not apply powder or lotion to the site.   Do not submerge the affected site in water for 3 to 5 days.   Inspect the site at least twice daily.   Do not flex or bend the affected arm for 24 hours.   No lifting over 5 pounds (2.3 kg) for 5 days after your procedure.   Do not drive home if you are discharged the same day of the procedure. Have someone else drive you.   What to expect:  Any bruising will usually fade within 1 to 2 weeks.   Blood that collects in the tissue (hematoma) may be painful to the touch. It should usually decrease in size and tenderness within 1 to 2 weeks.  SEEK IMMEDIATE MEDICAL CARE IF:  You have unusual pain at the radial site.   You have redness, warmth, swelling, or pain at the radial site.   You have drainage (other than a small amount of blood on the dressing).   You have chills.   You have a fever or persistent symptoms for more than 72 hours.   You have a fever and your symptoms suddenly get worse.   Your arm becomes pale, cool, tingly, or numb.   You have heavy bleeding from the site. Hold pressure on the site.   _____________    Coronary Artery Disease,  Female  Coronary artery disease (CAD) is a condition in which the arteries that lead to the heart (coronary arteries) become narrow or blocked. The narrowing or blockage can lead to decreased blood flow to the heart. Prolonged reduced blood flow can cause a heart attack (myocardial infarction or MI). This condition may also be called coronary heart disease. Because CAD is the leading cause of death in women, it is important to understand what causes this condition and how it is treated. What are the causes? CAD is most often caused by atherosclerosis. This is the buildup of fat and cholesterol (plaque) on the inside of the arteries. Over time, the plaque may narrow or block the artery, reducing blood flow to the heart. Plaque can also become weak and break off within a coronary artery and cause a sudden blockage. Other less common causes of CAD include:  An embolism or blood clot in a coronary artery.  A tearing of the artery (spontaneous coronary artery dissection).  An aneurysm.  Inflammation (vasculitis) in the artery wall. What increases the risk? The following factors may make you more likely to develop this condition:  Age. Women over age 56 are at a greater risk of CAD.  Family history of CAD.  High blood pressure (hypertension).  Diabetes.  High cholesterol levels.  Tobacco use.  Lack of exercise.  Menopause. ? All postmenopausal women are at greater risk of CAD. ? Women who have experienced menopause between the ages of 12-45 (early menopause) are at a higher risk of CAD. ? Women who have experienced menopause before age 1 (premature menopause) are at a very high risk of CAD.  Excessive alcohol use  A diet high in saturated and trans fats, such as fried food and processed meat. Other possible risk factors include:  High stress levels.  Depression  Obesity.  Sleep apnea. What are the signs or symptoms? Many people do not have any symptoms during the early  stages of CAD. As the condition progresses, symptoms may include:  Chest pain (angina). The pain can: ? Feel like crushing or squeezing, or a tightness, pressure, fullness, or heaviness in the chest. ? Last more than a few minutes or can stop and recur. The pain tends to get worse with exercise or stress and to fade with rest.  Pain in the arms, neck, jaw, or back.  Unexplained heartburn or indigestion.  Shortness of breath.  Nausea.  Sudden cold sweats.  Sudden light-headedness.  Fluttering or fast heartbeat (palpitations). Many women have chest discomfort and the other symptoms. However, women often have unusual (atypical) symptoms, such as:  Fatigue.  Vomiting.  Unexplained feelings of nervousness or anxiety.  Unexplained weakness.  Dizziness or fainting. How is this diagnosed? This condition is diagnosed based on:  Your family and medical history.  A physical exam.  Tests, including: ? A test to check the electrical signals in your heart (electrocardiogram). ? Exercise stress test. This looks for signs of blockage when the heart is stressed with exercise, such as running on a treadmill. ? Pharmacologic stress test. This test looks for signs of blockage when the heart is being stressed with a medicine. ? Blood tests. ? Coronary angiogram. This is a procedure to look at the coronary arteries to see if there is any blockage. During this test, a dye is injected into your arteries so they appear on an X-ray. ? A test that uses sound waves to take a picture of your heart (echocardiogram). ? Chest X-ray. How is this treated? This condition may be treated by:  Healthy lifestyle changes to reduce risk factors.  Medicines such as: ? Antiplatelet medicines and blood-thinning medicines, such as aspirin. These help prevent blood clots. ? Nitroglycerin. ? Blood pressure medicines. ? Cholesterol-lowering medicine.  Coronary angioplasty and stenting. During this procedure,  a thin, flexible tube is inserted through a blood vessel and into a blocked artery. A balloon or similar device on the end of the tube is inflated to open up the artery. In some cases, a small, mesh tube (stent) is inserted into the artery to keep it open.  Coronary artery bypass surgery. During this surgery, veins or arteries from other parts of the body are used to create a bypass around the blockage and allow blood to reach your heart. Follow these instructions at home: Medicines  Take over-the-counter and prescription medicines only as told by your health care provider.  Do not take the following medicines unless your health care provider approves: ? NSAIDs, such as ibuprofen, naproxen, or celecoxib. ? Vitamin supplements that contain vitamin A, vitamin E, or both. ? Hormone replacement therapy that contains estrogen with or without progestin. Lifestyle  Follow an exercise program approved by your health care provider. Aim for 150 minutes of moderate exercise or 75 minutes of vigorous exercise each week.  Maintain a healthy weight or  lose weight as approved by your health care provider.  Rest when you are tired.  Learn to manage stress or try to limit your stress. Ask your health care provider for suggestions if you need help.  Get screened for depression and seek treatment, if needed.  Do not use any products that contain nicotine or tobacco, such as cigarettes and e-cigarettes. If you need help quitting, ask your health care provider.  Do not use illegal drugs. Eating and drinking  Follow a heart-healthy diet. A dietitian can help educate you about healthy food options and changes. In general, eat plenty of fruits and vegetables, lean meats, and whole grains.  Avoid foods high in: ? Sugar. ? Salt (sodium). ? Saturated fats, such as processed or fatty meat. ? Trans fats, such as fried food.  Use healthy cooking methods such as roasting, grilling, broiling, baking, poaching,  steaming, or stir-frying.  If you drink alcohol, and your health care provider approves, limit your alcohol intake to no more than 1 drink per day. One drink equals 12 ounces of beer, 5 ounces of wine, or 1 ounces of hard liquor. General instructions  Manage any other health conditions, such as hypertension and diabetes. These conditions affect your heart.  Your health care provider may ask you to monitor your blood pressure. Ideally, your blood pressure should be below 130/80.  Keep all follow-up visits as told by your health care provider. This is important. Get help right away if:  You have pain in your chest, neck, arm, jaw, stomach, or back that: ? Lasts more than a few minutes. ? Is recurring. ? Is not relieved by taking medicine under your tongue (sublingualnitroglycerin).  You have profuse sweating without cause.  You have unexplained: ? Heartburn or indigestion. ? Shortness of breath or difficulty breathing. ? Fluttering or fast heartbeat (palpitations). ? Nausea or vomiting. ? Fatigue. ? Feelings of nervousness or anxiety. ? Weakness. ? Diarrhea.  You have sudden light-headedness or dizziness.  You faint.  You feel like hurting yourself or think about taking your own life. These symptoms may represent a serious problem that is an emergency. Do not wait to see if the symptoms will go away. Get medical help right away. Call your local emergency services (911 in the [place] Do not drive yourself to the hospital. Summary  Coronary artery disease (CAD) is a process in which the arteries that lead to the heart (coronary arteries) become narrow or blocked. The narrowing or blockage can lead to a heart attack.  Many women have chest discomfort and other common symptoms of CAD. However, women often have different (atypical) symptoms, such as fatigue, vomiting, and dizziness or weakness.  CAD can be treated with lifestyle changes, medicines, surgery, or a combination of these  treatments. This information is not intended to replace advice given to you by your health care provider. Make sure you discuss any questions you have with your health care provider. Document Released: 02/17/2012 Document Revised: 11/15/2016 Document Reviewed: 11/15/2016 Elsevier Interactive Patient Education  2019 [employer].

## 2018-12-05 NOTE — Progress Notes (Signed)
RN removed both IVs; her cath site was dry and clean on her R wrist.  Bandage was completely clean.  RN removed the arm binder that had been placed on the right arm to keep it still and secure the dressing. RN gave telemetry box to the Network engineer.   Patient left in a private vehicle with her mother.  34 M [name] Brass,RN

## 2018-12-05 NOTE — Discharge Planning (Signed)
Patient was referred to outpatient Cardiac Rehab at [facility], as patient underwent PTCA on 12/04/2018.  Noted in Dr. Tyrell Antonio recommendations on the cath report that patient will need a staged PCI of the RCA in approximately one month.  This RN reached out to Dr. Fletcher Anon to confirm patient should wait on starting Cardiac Rehab until after staged PCI.  Dr. Fletcher Anon agreed that patient must have staged PCI of RCA before starting outpatient Cardiac Rehab.    Cardiac Rehab Department informed.  Patient was discharged home from 2A - Telemetry earlier today.  [name], RN, BSN, [facility]  Cardiovascular & Pulmonary Nurse Navigator  Direct Line: [phone]  Department Phone #: [phone] Fax: [phone]  Email Address: [email or web address]

## 2018-12-05 NOTE — Plan of Care (Signed)
  Problem: Clinical Measurements: Goal: Ability to maintain clinical measurements within normal limits will improve Outcome: Progressing Goal: Diagnostic test results will improve Outcome: Progressing   Problem: Activity: Goal: Risk for activity intolerance will decrease Outcome: Progressing   Problem: Pain Managment: Goal: General experience of comfort will improve Outcome: Progressing   Problem: Safety: Goal: Ability to remain free from injury will improve Outcome: Progressing   Problem: Cardiovascular: Goal: Vascular access site(s) Level 0-1 will be maintained Outcome: Progressing

## 2018-12-06 ENCOUNTER — Encounter: Payer: Self-pay | Admitting: Cardiovascular Disease

## 2018-12-07 ENCOUNTER — Telehealth: Payer: Self-pay | Admitting: *Deleted

## 2018-12-07 DIAGNOSIS — I25119 Atherosclerotic heart disease of native coronary artery with unspecified angina pectoris: Secondary | ICD-10-CM

## 2018-12-07 LAB — POCT ACTIVATED CLOTTING TIME
Activated Clotting Time: 268 seconds
Activated Clotting Time: 263 seconds

## 2018-12-07 NOTE — Telephone Encounter (Signed)
Call returned to pt to make her aware of lab work requested by provider. Pt verbalized understanding.  Advised pt to call for any further questions or concerns

## 2018-12-07 NOTE — Telephone Encounter (Signed)
Patient returning call.

## 2018-12-07 NOTE — Telephone Encounter (Signed)
-----   Message from [name] Mu, PA-C sent at 12/04/2018  4:52 PM EST ----- Please have the patient come in for a bmet on 12/31 or 1/2. Dx: CKD stage III.

## 2018-12-07 NOTE — Telephone Encounter (Signed)
Attempted to call patient. LMTCB 12/30  Order placed for BMET this week.

## 2018-12-10 ENCOUNTER — Other Ambulatory Visit
Admission: RE | Admit: 2018-12-10 | Discharge: 2018-12-10 | Disposition: A | Discharge status: Home / Self Care | Payer: Medicare Other | Source: Ambulatory Visit | Attending: Physician Assistant | Admitting: Physician Assistant

## 2018-12-10 ENCOUNTER — Telehealth: Payer: Self-pay

## 2018-12-10 DIAGNOSIS — I25119 Atherosclerotic heart disease of native coronary artery with unspecified angina pectoris: Secondary | ICD-10-CM | POA: Diagnosis not present

## 2018-12-10 LAB — BASIC METABOLIC PANEL
Anion gap: 9 (ref 5–15)
BUN: 41 mg/dL — ABNORMAL HIGH (ref 6–20)
CO2: 20 mmol/L — ABNORMAL LOW (ref 22–32)
Calcium: 8.6 mg/dL — ABNORMAL LOW (ref 8.9–10.3)
Chloride: 107 mmol/L (ref 98–111)
Creatinine, Ser: 1.89 mg/dL — ABNORMAL HIGH (ref 0.44–1.00)
GFR calc Af Amer: 38 mL/min — ABNORMAL LOW (ref >60)
GFR calc non Af Amer: 33 mL/min — ABNORMAL LOW (ref >60)
Glucose, Bld: 263 mg/dL — ABNORMAL HIGH (ref 70–99)
Potassium: 4.1 mmol/L (ref 3.5–5.1)
Sodium: 136 mmol/L (ref 135–145)

## 2018-12-10 NOTE — Telephone Encounter (Signed)
Call to patient to go over lab results and suggestions from provider.  She verbalized understanding to hold lasix for 2 days.  She did endorse lower extremity swelling but denied Sob at this time.  She agreed to call back if she had SOB.   She has f/u next week with Christell Faith and will have lab work done at that time.  Advised pt to call for any further questions or concerns

## 2018-12-10 NOTE — Telephone Encounter (Signed)
-----   Message from Rise Mu, PA-C sent at 12/10/2018 11:07 AM EST ----- Potassium at goal. Renal function is slightly higher than baseline following cath. Would hold Lasix for next 2 days if she has been taking this. Increase water intake.  Recheck BMP at her appointment next week.

## 2018-12-16 NOTE — Progress Notes (Signed)
Cardiology Office Note Date:  12/17/2018  Patient ID:  [name] [name] 08/12/1981, MRN [id number] PCP:  Venita Lick, NP  Cardiologist:  Dr. Fletcher Anon, MD    Chief Complaint: Hospital follow-up  History of Present Illness: [name] [name] is a 38 y.o. female with history of CAD s/p prior LCx and OM stenting, chronic combined CHF/ICM with subsequent normalization of the LVSF, DM2 diagnosed in 2000, CKD stage II-III, anemia, diabetic gastroparesis, HTN, and HLD who presents for hospital follow-up after recent admission to [facility] from 12/26 through 12/27 for diagnostic cath.  Cardiac history dates back to 03/2015, when she was admitted with chest pain and ruled in for a NSTEMI.She was found to have LV dysfunction with an EF of 30 to 35%, and underwent diagnostic cath that revealed severe LCx and OM1 disease. Both areas were successfully treated with PCI/DES.Follow-up cath in 04/2015, revealed patency of both stents with normalization of LV function by LV gram.Of note, there is a report from 04/2015, which refers to the obtuse marginal stent as a ramus intermedius stent. Stress testing was performed in 02/2016, which was nonischemic and low risk. She has had ongoing intermittent chest pain over the years, typically occurring 2-3 times per week. She was seen in the office on 11/10/2018 with a noted increase in [place] as well as more frequent exertional and rest chest discomfort. Her weight was up 28 pounds when compared to her 06/2018 visit, despite reporting reduced oral intake. Low-dose Lasix was added with stable renal function being noted. Her weight gain was felt to be far out of proportion to her volume status. She did not follow up with recommended repeat BMET. Subsequent echo showed an EF of 60-65%. She was last seen in the office on 12/03/2018 and noted an improvement in her lower extremity edema since starting Lasix, but continued to note DOE and chest discomfort at rest and with exertion. Ischemic  evaluation options were discussed with the patient with plan to proceed with diagnostic cardiac cath, scheduled for 12/04/2018, which showed significant two-vessel CAD with patent stent and OM1, and focal in-stent restenosis in the mid LCx at the origin of OM 2.  She also had new significant disease in the proximal to mid RCA.  The LCx was successfully treated with balloon angioplasty.  This was guided by intravascular ultrasound and residual 20% stenosis was noted.  It was recommended she undergo staged PCI to the RCA in approximately 4 months given the patient's underlying CKD.  Initially, the patient was placed on Brilinta however she knew she would not be able to afford this as she had previously used an assistance card.  In that setting, at time of discharge, she was loaded with Plavix.   Her Lasix was changed to as needed.  Post-cath renal function showed slight increase in creatinine to 1.89.  She comes in accompanied by her mother today.  She continues to note intermittent chest pain, shortness of breath, and lower extremity swelling.  Symptoms are exacerbated with exertion.  She has been compliant with all medications including dual antiplatelet therapy without missing any doses.  No falls since her hospital discharge.  No BRBPR or melena.  Blood pressure remains well controlled in the 120s over 70s at home.  She has compression stockings at home though does not wear them.  She does report elevation of her legs when sitting.  She has been taking Lasix every other day to every 2 days secondary to lower extremity swelling.  She indicates  over the past several months her orthopnea has increased to 4-5 pillows from a prior of 2 pillows.  She is compliant with Crestor though does note myalgias and generalized fatigue with this medicine.  She experienced similar symptoms with Lipitor leading to its discontinuation.  She is currently asymptomatic.  Past Medical History:  Diagnosis Date  . CAD (coronary  artery disease)    a. 03/2015 NSTEMI/PCI: LCX 147m (DES), OM1 100p (DES - vessel labeled Ramus in subsequent cath report). Minor irregs to LAD/RCA; b. 04/2015 Cath: LM nl, LAD 40p, RI patent stent, LCX patent stent, RCA nl, EF 55-65%; c. 02/2016 MV: basal inferolateral and mid inferolateral defect/infarct w/o ischemia. EF 55-65%; d. 11/2018 PCI: LM 30ost, LAD 30p, LCX 95p/m (PTCA->20%), OM1 10ost, OM2 40ost, RCA 85p.  Marland Kitchen Chronic diastolic (congestive) heart failure ([place])    a. 03/2015 Echo: EF 30-35%, mild concentric LVH, severe HK of inf, inflat, & lat walls, mod MR, mild TR;  b. 04/2015 LV Gram: EF 55-65%; c. 09/2017 Echo: EF 55-60%, no rwma. Mild MR; d. 11/2018 Echo: Ef 60-65%, no rwma.  . CKD (chronic kidney disease), stage III ([place])   . Diabetes type 2, controlled ([place])    a. since 2002   . Diabetic gastroparesis ([facility])    a. chronic nausea/vomiting.  . Diverticulosis, sigmoid 07/2016  . Heavy menses    a. H/O IUD - expired in 2014 - remains in place.  . Hyperlipidemia   . Hypertension   . Iron deficiency anemia   . Ischemic cardiomyopathy (resolved)    a. 03/2015 EF 30-35% post NSTEMI;  b. 04/2015 EF 55-65% on LV gram; c. 09/2017 Echo: EF 55-60%; d. 11/2018 Echo: Ef 60-65%.  . Obesity   . Tobacco abuse    a. quit 03/2015.  Marland Kitchen Uterine fibroid   . Vertigo   . Vitamin D deficiency     Past Surgical History:  Procedure Laterality Date  . APPENDECTOMY    . CARDIAC CATHETERIZATION  4/16   x2 stent [facility]  . CARDIAC CATHETERIZATION N/A 05/05/2015   Procedure: Left Heart Cath and Coronary Angiography;  Surgeon: Peter M Martinique, MD;  Location: [place] CV LAB;  Service: Cardiovascular;  Laterality: N/A;  . COLONOSCOPY WITH PROPOFOL N/A 07/31/2016   Procedure: COLONOSCOPY WITH PROPOFOL;  Surgeon: Lollie Sails, MD;  Location: [facility] ENDOSCOPY;  Service: Endoscopy;  Laterality: N/A;  . CORONARY BALLOON ANGIOPLASTY N/A 12/04/2018   Procedure: CORONARY BALLOON ANGIOPLASTY;  Surgeon: Wellington Hampshire, MD;  Location: [place] [facility];  Service: Cardiovascular;  Laterality: N/A;  . ESOPHAGOGASTRODUODENOSCOPY (EGD) WITH PROPOFOL N/A 07/31/2016   Procedure: ESOPHAGOGASTRODUODENOSCOPY (EGD) WITH PROPOFOL;  Surgeon: Lollie Sails, MD;  Location: [facility] ENDOSCOPY;  Service: Endoscopy;  Laterality: N/A;  . INTRAVASCULAR PRESSURE WIRE/FFR STUDY N/A 12/04/2018   Procedure: INTRAVASCULAR PRESSURE WIRE/FFR STUDY;  Surgeon: Wellington Hampshire, MD;  Location: [place] CV LAB;  Service: Cardiovascular;  Laterality: N/A;  . LAPAROSCOPIC APPENDECTOMY N/A 08/07/2016   Procedure: APPENDECTOMY LAPAROSCOPIC;  Surgeon: Hubbard Robinson, MD;  Location: [facility] ORS;  Service: General;  Laterality: N/A;  . LEFT HEART CATH AND CORONARY ANGIOGRAPHY Left 12/04/2018   Procedure: LEFT HEART CATH AND CORONARY ANGIOGRAPHY;  Surgeon: Wellington Hampshire, MD;  Location: [place] CV LAB;  Service: Cardiovascular;  Laterality: Left;  . MOUTH SURGERY      Current Meds  Medication Sig  . albuterol (PROVENTIL HFA;VENTOLIN HFA) 108 (90 Base) MCG/ACT inhaler Inhale 2 puffs into the lungs  every 6 (six) hours as needed for wheezing or shortness of breath.  Marland Kitchen aspirin EC 81 MG tablet Take 81 mg by mouth daily.  . benzonatate (TESSALON) 100 MG capsule Take 1 capsule (100 mg total) by mouth 2 (two) times daily as needed for cough.  . Cholecalciferol 2000 units TABS Take 1 tablet by mouth daily.  . clopidogrel (PLAVIX) 75 MG tablet Take 4 tabs at 7pm tonight (12/28), then 1 tab daily beginning 12/29.  . DULoxetine (CYMBALTA) 30 MG capsule TAKE 1 CAPSULE BY MOUTH DAILY FOR 1 WEEK THEN TAKE 2 CAPSULES BY MOUTH DAILY THEREAFTER  . etonogestrel (NEXPLANON) 68 MG IMPL implant 1 each by Subdermal route once.  Marland Kitchen FARXIGA 10 MG TABS tablet TAKE 1 TABLET BY MOUTH ONCE DAILY IN THE MORNING  . ferrous sulfate 325 (65 FE) MG tablet Take 1 tablet (325 mg total) by mouth daily.  . furosemide (LASIX) 20 MG tablet Take 1 tablet (20 mg total)  by mouth daily as needed (wt gain of 2 lbs in 1 day or 5 lbs over the course of a week.).  Marland Kitchen glimepiride (AMARYL) 4 MG tablet TAKE 1 TABLET BY MOUTH ONCE DAILY  . glucose blood (ACCU-CHEK ACTIVE STRIPS) test strip Use as instructed  . glucose blood (ACCU-CHEK AVIVA) test strip 1 each by Other route as needed for other. Use as instructed  . insulin aspart (NOVOLOG) 100 UNIT/ML injection Inject 10 Units into the skin 3 (three) times daily with meals.  . insulin glargine (LANTUS) 100 UNIT/ML injection Inject 0.4 mLs (40 Units total) into the skin at bedtime.  . Insulin Pen Needle 32G X 4 MM MISC 1 Units by Does not apply route every morning. Pen needles  . isosorbide mononitrate (IMDUR) 30 MG 24 hr tablet TAKE 1 TABLET BY MOUTH ONCE DAILY  . meclizine (ANTIVERT) 32 MG tablet Take 1 tablet (32 mg total) by mouth 3 (three) times daily as needed. (Patient taking differently: Take 32 mg by mouth 3 (three) times daily as needed for dizziness or nausea. )  . metoprolol tartrate (LOPRESSOR) 50 MG tablet Take 1 tablet (50 mg total) by mouth 2 (two) times daily.  . mirtazapine (REMERON) 30 MG tablet Take 30 mg by mouth at bedtime.  . nitroGLYCERIN (NITROSTAT) 0.4 MG SL tablet Take 1 tab under your tongue while sitting for chest pain, If no relief may repeat, one tab every 5 min up to 3 tabs total over 15 mins.  . pantoprazole (PROTONIX) 40 MG tablet TAKE 1 TABLET BY MOUTH ONCE DAILY AT 6AM  . rosuvastatin (CRESTOR) 5 MG tablet TAKE ONE TABLET BY MOUTH ONCE DAILY    Allergies:   Lisinopril; Rosemary oil; Shellfish allergy; Other; and Metformin and related   Social History:  The patient  reports that she quit smoking about 3 years ago. She quit after 15.00 years of use. She has never used smokeless tobacco. She reports that she does not drink alcohol or use drugs.   Family History:  The patient's family history includes Cancer in her maternal grandfather and maternal grandmother; Cancer - Cervical in her  maternal aunt; Diabetes in her father, paternal grandfather, and paternal grandmother.  ROS:   Review of Systems  Constitutional: Positive for malaise/fatigue. Negative for chills, diaphoresis, fever and weight loss.  HENT: Negative for congestion.   Eyes: Negative for discharge and redness.  Respiratory: Positive for shortness of breath. Negative for cough, hemoptysis, sputum production and wheezing.   Cardiovascular: Positive for chest pain,  orthopnea and leg swelling. Negative for palpitations, claudication and PND.  Gastrointestinal: Negative for abdominal pain, blood in stool, heartburn, melena, nausea and vomiting.  Genitourinary: Negative for hematuria.  Musculoskeletal: Positive for myalgias. Negative for falls.  Skin: Negative for rash.  Neurological: Positive for weakness. Negative for dizziness, tingling, tremors, sensory change, speech change, focal weakness and loss of consciousness.  Endo/Heme/Allergies: Does not bruise/bleed easily.  Psychiatric/Behavioral: Negative for substance abuse. The patient is not nervous/anxious.   All other systems reviewed and are negative.    PHYSICAL EXAM:  VS:  BP 120/70 (BP Location: Left Arm, Patient Position: Sitting, Cuff Size: Normal)   Pulse 93   Ht 5\' 5"  (1.651 m)   Wt 269 lb (122 kg)   BMI 44.76 kg/m  BMI: Body mass index is 44.76 kg/m.  Physical Exam  Constitutional: She is oriented to person, place, and time. She appears well-developed and well-nourished.  HENT:  Head: Normocephalic and atraumatic.  Eyes: Right eye exhibits no discharge. Left eye exhibits no discharge.  Neck: Normal range of motion. No JVD present.  Cardiovascular: Normal rate, regular rhythm, S1 normal, S2 normal and normal heart sounds. Exam reveals no distant heart sounds, no friction rub, no midsystolic click and no opening snap.  No murmur heard. Pulses:      Posterior tibial pulses are 2+ on the right side and 2+ on the left side.  Right radial  cardiac cath site is well-healing without any active bleeding, bruising, swelling, warmth, erythema, or tenderness to palpation.  Radial pulse 2+.  Pulmonary/Chest: Effort normal and breath sounds normal. No respiratory distress. She has no decreased breath sounds. She has no wheezes. She has no rales. She exhibits no tenderness.  Abdominal: Soft. She exhibits no distension. There is no abdominal tenderness.  Musculoskeletal:        General: Edema present.     Comments: Trace bilateral ankle edema  Neurological: She is alert and oriented to person, place, and time.  Skin: Skin is warm and dry. No cyanosis. Nails show no clubbing.  Psychiatric: She has a normal mood and affect. Her speech is normal and behavior is normal. Judgment and thought content normal.     EKG:  Was ordered and interpreted by me today. Shows NSR, 93 bpm, left axis deviation, no acute ST-T changes  Recent Labs: 05/01/2018: ALT 13; Magnesium 1.9 12/05/2018: Hemoglobin 10.0; Platelets 392 12/10/2018: BUN 41; Creatinine, Ser 1.89; Potassium 4.1; Sodium 136  10/06/2018: Chol/HDL Ratio 6.1; Cholesterol, Total 236; HDL 39; LDL Calculated 163; Triglycerides 171   Estimated Creatinine Clearance: 52.9 mL/min (A) (by C-G formula based on SCr of 1.89 mg/dL (H)).   Wt Readings from Last 3 Encounters:  12/17/18 269 lb (122 kg)  12/05/18 267 lb 11.2 oz (121.4 kg)  12/03/18 269 lb 12.8 oz (122.4 kg)     Other studies reviewed: Additional studies/records reviewed today include: summarized above  ASSESSMENT AND PLAN:  1. CAD involving the native coronary arteries with stable angina: She continues to have stable exertional symptoms.  Increase Imdur to 60 mg daily.  Continue aspirin and Plavix.  Remains on Lopressor as below.  Continue Crestor as below however, plans will be to transition her from statin to PCSK9 inhibitor given her statin intolerance.  Schedule a staged PCI of the RCA with relook at the LCx with Dr. Fletcher Anon later this  month or early in February, 2020.  Following this, I recommend cardiac rehab.  Aggressive risk factor modification and secondary prevention.  2. Chronic combined CHF with subsequent normalization of EF: She continues to note weight gain and lower extremity swelling which are both significantly out of proportion to her volume status on physical exam and by laboratory evaluation.  Continue PRN Lasix and have advised the patient to use this sparingly.  I would not escalate Lasix to scheduled dosing at this time given she does not appear grossly volume overloaded.  We could consider right heart cath if her symptoms persist to better estimate volume status.  I suspect her lower extremity swelling is likely due to dependent edema from venous insufficiency and morbid obesity.  I have recommended the patient wear compression stockings and elevate her legs.  Most recent echocardiogram demonstrated preserved LV systolic function.  3. CKD stage II-III: Check CMP as below.  4. Hyperlipidemia: Most recent LDL of 163 from 09/2018 which was obtained while not on a statin.  Has previously had to discontinue Lipitor secondary to myalgias.  She is now on Crestor though continues to note myalgias and muscle fatigue.  We are checking a CMP, lipid, and direct LDL today.  Following review of these results I would recommend transitioning from statin therapy to Zetia as well as PCSK9 inhibitor given her statin intolerance.  This was reviewed with the patient today and her mother, both are agreeable.  5. Hypertension: Blood pressure is well controlled today.  Increase isosorbide mononitrate as above otherwise she will remain on Lopressor and PRN Lasix.  6. Insulin-dependent diabetes mellitus: Managed by PCP.  7. Morbid obesity/orthopnea: Weight loss is advised.  Following state intervention of the RCA as above, given her symptoms she may benefit from outpatient sleep study.  8. Iron deficiency anemia: Check CBC given dual  antiplatelet therapy.  Followed by PCP.  Disposition: F/u with Dr. Fletcher Anon or an APP in 1 month, which will be approximately 1 week following her staged PCI as above.  Current medicines are reviewed at length with the patient today.  The patient did not have any concerns regarding medicines.  Signed, Christell Faith, PA-C 12/17/2018 11:05 AM     [place] [address] [place], [place] [address] [phone]

## 2018-12-16 NOTE — H&P (View-Only) (Signed)
Cardiology Office Note Date:  12/17/2018  Patient ID:  [name], [name] [birth year], MRN [id number] PCP:  Venita Lick, NP  Cardiologist:  Dr. Fletcher Anon, MD    Chief Complaint: Hospital follow-up  History of Present Illness: [name] [name] is a 38 y.o. female with history of CAD s/p prior LCx and OM stenting, chronic combined CHF/ICM with subsequent normalization of the LVSF, DM2 diagnosed in 2000, CKD stage II-III, anemia, diabetic gastroparesis, HTN, and HLD who presents for hospital follow-up after recent admission to [name] from 12/26 through 12/27 for diagnostic cath.  Cardiac history dates back to 03/2015, when she was admitted with chest pain and ruled in for a NSTEMI.She was found to have LV dysfunction with an EF of 30 to 35%, and underwent diagnostic cath that revealed severe LCx and OM1 disease. Both areas were successfully treated with PCI/DES.Follow-up cath in 04/2015, revealed patency of both stents with normalization of LV function by LV gram.Of note, there is a report from 04/2015, which refers to the obtuse marginal stent as a ramus intermedius stent. Stress testing was performed in 02/2016, which was nonischemic and low risk. She has had ongoing intermittent chest pain over the years, typically occurring 2-3 times per week. She was seen in the office on 11/10/2018 with a noted increase in [name] [name] as well as more frequent exertional and rest chest discomfort. Her weight was up 28 pounds when compared to her 06/2018 visit, despite reporting reduced oral intake. Low-dose Lasix was added with stable renal function being noted. Her weight gain was felt to be far out of proportion to her volume status. She did not follow up with recommended repeat BMET. Subsequent echo showed an EF of 60-65%. She was last seen in the office on 12/03/2018 and noted an improvement in her lower extremity edema since starting Lasix, but continued to note DOE and chest discomfort at rest and with exertion. Ischemic  evaluation options were discussed with the patient with plan to proceed with diagnostic cardiac cath, scheduled for 12/04/2018, which showed significant two-vessel CAD with patent stent and OM1, and focal in-stent restenosis in the mid LCx at the origin of OM 2.  She also had new significant disease in the proximal to mid RCA.  The LCx was successfully treated with balloon angioplasty.  This was guided by intravascular ultrasound and residual 20% stenosis was noted.  It was recommended she undergo staged PCI to the RCA in approximately 4 months given the patient's underlying CKD.  Initially, the patient was placed on Brilinta however she knew she would not be able to afford this as she had previously used an assistance card.  In that setting, at time of discharge, she was loaded with Plavix.   Her Lasix was changed to as needed.  Post-cath renal function showed slight increase in creatinine to 1.89.  She comes in accompanied by her mother today.  She continues to note intermittent chest pain, shortness of breath, and lower extremity swelling.  Symptoms are exacerbated with exertion.  She has been compliant with all medications including dual antiplatelet therapy without missing any doses.  No falls since her hospital discharge.  No BRBPR or melena.  Blood pressure remains well controlled in the 120s over 70s at home.  She has compression stockings at home though does not wear them.  She does report elevation of her legs when sitting.  She has been taking Lasix every other day to every 2 days secondary to lower extremity swelling.  She indicates  over the past several months her orthopnea has increased to 4-5 pillows from a prior of 2 pillows.  She is compliant with Crestor though does note myalgias and generalized fatigue with this medicine.  She experienced similar symptoms with Lipitor leading to its discontinuation.  She is currently asymptomatic.  Past Medical History:  Diagnosis Date  . CAD (coronary  artery disease)    a. 03/2015 NSTEMI/PCI: LCX 131m (DES), OM1 100p (DES - vessel labeled Ramus in subsequent cath report). Minor irregs to LAD/RCA; b. 04/2015 Cath: LM nl, LAD 40p, RI patent stent, LCX patent stent, RCA nl, EF 55-65%; c. 02/2016 MV: basal inferolateral and mid inferolateral defect/infarct w/o ischemia. EF 55-65%; d. 11/2018 PCI: LM 30ost, LAD 30p, LCX 95p/m (PTCA->20%), OM1 10ost, OM2 40ost, RCA 85p.  Marland Kitchen Chronic diastolic (congestive) heart failure ([name] [name])    a. 03/2015 Echo: EF 30-35%, mild concentric LVH, severe HK of inf, inflat, & lat walls, mod MR, mild TR;  b. 04/2015 LV Gram: EF 55-65%; c. 09/2017 Echo: EF 55-60%, no rwma. Mild MR; d. 11/2018 Echo: Ef 60-65%, no rwma.  . CKD (chronic kidney disease), stage III ([name] [name])   . Diabetes type 2, controlled ([name] [name])    a. since 2002   . Diabetic gastroparesis (HCC)    a. chronic nausea/vomiting.  . Diverticulosis, sigmoid 07/2016  . Heavy menses    a. H/O IUD - expired in 2014 - remains in place.  . Hyperlipidemia   . Hypertension   . Iron deficiency anemia   . Ischemic cardiomyopathy (resolved)    a. 03/2015 EF 30-35% post NSTEMI;  b. 04/2015 EF 55-65% on LV gram; c. 09/2017 Echo: EF 55-60%; d. 11/2018 Echo: Ef 60-65%.  . Obesity   . Tobacco abuse    a. quit 03/2015.  Marland Kitchen Uterine fibroid   . Vertigo   . Vitamin D deficiency     Past Surgical History:  Procedure Laterality Date  . APPENDECTOMY    . CARDIAC CATHETERIZATION  4/16   x2 stent [facility]  . CARDIAC CATHETERIZATION N/A 05/05/2015   Procedure: Left Heart Cath and Coronary Angiography;  Surgeon: Peter M Martinique, MD;  Location: [place] [facility];  Service: Cardiovascular;  Laterality: N/A;  . COLONOSCOPY WITH PROPOFOL N/A 07/31/2016   Procedure: COLONOSCOPY WITH PROPOFOL;  Surgeon: Lollie Sails, MD;  Location: [facility] ENDOSCOPY;  Service: Endoscopy;  Laterality: N/A;  . CORONARY BALLOON ANGIOPLASTY N/A 12/04/2018   Procedure: CORONARY BALLOON ANGIOPLASTY;  Surgeon: Wellington Hampshire, MD;  Location: [place] [facility];  Service: Cardiovascular;  Laterality: N/A;  . ESOPHAGOGASTRODUODENOSCOPY (EGD) WITH PROPOFOL N/A 07/31/2016   Procedure: ESOPHAGOGASTRODUODENOSCOPY (EGD) WITH PROPOFOL;  Surgeon: Lollie Sails, MD;  Location: [facility] ENDOSCOPY;  Service: Endoscopy;  Laterality: N/A;  . INTRAVASCULAR PRESSURE WIRE/FFR STUDY N/A 12/04/2018   Procedure: INTRAVASCULAR PRESSURE WIRE/FFR STUDY;  Surgeon: Wellington Hampshire, MD;  Location: [place] [facility];  Service: Cardiovascular;  Laterality: N/A;  . LAPAROSCOPIC APPENDECTOMY N/A 08/07/2016   Procedure: APPENDECTOMY LAPAROSCOPIC;  Surgeon: Hubbard Robinson, MD;  Location: [facility] ORS;  Service: General;  Laterality: N/A;  . LEFT HEART CATH AND CORONARY ANGIOGRAPHY Left 12/04/2018   Procedure: LEFT HEART CATH AND CORONARY ANGIOGRAPHY;  Surgeon: Wellington Hampshire, MD;  Location: [place] [facility];  Service: Cardiovascular;  Laterality: Left;  . MOUTH SURGERY      Current Meds  Medication Sig  . albuterol (PROVENTIL HFA;VENTOLIN HFA) 108 (90 Base) MCG/ACT inhaler Inhale 2 puffs into the lungs  every 6 (six) hours as needed for wheezing or shortness of breath.  Marland Kitchen aspirin EC 81 MG tablet Take 81 mg by mouth daily.  . benzonatate (TESSALON) 100 MG capsule Take 1 capsule (100 mg total) by mouth 2 (two) times daily as needed for cough.  . Cholecalciferol 2000 units TABS Take 1 tablet by mouth daily.  . clopidogrel (PLAVIX) 75 MG tablet Take 4 tabs at 7pm tonight (12/28), then 1 tab daily beginning 12/29.  . DULoxetine (CYMBALTA) 30 MG capsule TAKE 1 CAPSULE BY MOUTH DAILY FOR 1 WEEK THEN TAKE 2 CAPSULES BY MOUTH DAILY THEREAFTER  . etonogestrel (NEXPLANON) 68 MG IMPL implant 1 each by Subdermal route once.  Marland Kitchen FARXIGA 10 MG TABS tablet TAKE 1 TABLET BY MOUTH ONCE DAILY IN THE MORNING  . ferrous sulfate 325 (65 FE) MG tablet Take 1 tablet (325 mg total) by mouth daily.  . furosemide (LASIX) 20 MG tablet Take 1 tablet (20 mg total)  by mouth daily as needed (wt gain of 2 lbs in 1 day or 5 lbs over the course of a week.).  Marland Kitchen glimepiride (AMARYL) 4 MG tablet TAKE 1 TABLET BY MOUTH ONCE DAILY  . glucose blood (ACCU-CHEK ACTIVE STRIPS) test strip Use as instructed  . glucose blood (ACCU-CHEK AVIVA) test strip 1 each by Other route as needed for other. Use as instructed  . insulin aspart (NOVOLOG) 100 UNIT/ML injection Inject 10 Units into the skin 3 (three) times daily with meals.  . insulin glargine (LANTUS) 100 UNIT/ML injection Inject 0.4 mLs (40 Units total) into the skin at bedtime.  . Insulin Pen Needle 32G X 4 MM MISC 1 Units by Does not apply route every morning. Pen needles  . isosorbide mononitrate (IMDUR) 30 MG 24 hr tablet TAKE 1 TABLET BY MOUTH ONCE DAILY  . meclizine (ANTIVERT) 32 MG tablet Take 1 tablet (32 mg total) by mouth 3 (three) times daily as needed. (Patient taking differently: Take 32 mg by mouth 3 (three) times daily as needed for dizziness or nausea. )  . metoprolol tartrate (LOPRESSOR) 50 MG tablet Take 1 tablet (50 mg total) by mouth 2 (two) times daily.  . mirtazapine (REMERON) 30 MG tablet Take 30 mg by mouth at bedtime.  . nitroGLYCERIN (NITROSTAT) 0.4 MG SL tablet Take 1 tab under your tongue while sitting for chest pain, If no relief may repeat, one tab every 5 min up to 3 tabs total over 15 mins.  . pantoprazole (PROTONIX) 40 MG tablet TAKE 1 TABLET BY MOUTH ONCE DAILY AT 6AM  . rosuvastatin (CRESTOR) 5 MG tablet TAKE ONE TABLET BY MOUTH ONCE DAILY    Allergies:   Lisinopril; Rosemary oil; Shellfish allergy; Other; and Metformin and related   Social History:  The patient  reports that she quit smoking about 3 years ago. She quit after 15.00 years of use. She has never used smokeless tobacco. She reports that she does not drink alcohol or use drugs.   Family History:  The patient's family history includes Cancer in her maternal grandfather and maternal grandmother; Cancer - Cervical in her  maternal aunt; Diabetes in her father, paternal grandfather, and paternal grandmother.  ROS:   Review of Systems  Constitutional: Positive for malaise/fatigue. Negative for chills, diaphoresis, fever and weight loss.  HENT: Negative for congestion.   Eyes: Negative for discharge and redness.  Respiratory: Positive for shortness of breath. Negative for cough, hemoptysis, sputum production and wheezing.   Cardiovascular: Positive for chest pain,  orthopnea and leg swelling. Negative for palpitations, claudication and PND.  Gastrointestinal: Negative for abdominal pain, blood in stool, heartburn, melena, nausea and vomiting.  Genitourinary: Negative for hematuria.  Musculoskeletal: Positive for myalgias. Negative for falls.  Skin: Negative for rash.  Neurological: Positive for weakness. Negative for dizziness, tingling, tremors, sensory change, speech change, focal weakness and loss of consciousness.  Endo/Heme/Allergies: Does not bruise/bleed easily.  Psychiatric/Behavioral: Negative for substance abuse. The patient is not nervous/anxious.   All other systems reviewed and are negative.    PHYSICAL EXAM:  VS:  BP 120/70 (BP Location: Left Arm, Patient Position: Sitting, Cuff Size: Normal)   Pulse 93   Ht 5\' 5"  (1.651 m)   Wt 269 lb (122 kg)   BMI 44.76 kg/m  BMI: Body mass index is 44.76 kg/m.  Physical Exam  Constitutional: She is oriented to person, place, and time. She appears well-developed and well-nourished.  HENT:  Head: Normocephalic and atraumatic.  Eyes: Right eye exhibits no discharge. Left eye exhibits no discharge.  Neck: Normal range of motion. No JVD present.  Cardiovascular: Normal rate, regular rhythm, S1 normal, S2 normal and normal heart sounds. Exam reveals no distant heart sounds, no friction rub, no midsystolic click and no opening snap.  No murmur heard. Pulses:      Posterior tibial pulses are 2+ on the right side and 2+ on the left side.  Right radial  cardiac cath site is well-healing without any active bleeding, bruising, swelling, warmth, erythema, or tenderness to palpation.  Radial pulse 2+.  Pulmonary/Chest: Effort normal and breath sounds normal. No respiratory distress. She has no decreased breath sounds. She has no wheezes. She has no rales. She exhibits no tenderness.  Abdominal: Soft. She exhibits no distension. There is no abdominal tenderness.  Musculoskeletal:        General: Edema present.     Comments: Trace bilateral ankle edema  Neurological: She is alert and oriented to person, place, and time.  Skin: Skin is warm and dry. No cyanosis. Nails show no clubbing.  Psychiatric: She has a normal mood and affect. Her speech is normal and behavior is normal. Judgment and thought content normal.     EKG:  Was ordered and interpreted by me today. Shows NSR, 93 bpm, left axis deviation, no acute ST-T changes  Recent Labs: 05/01/2018: ALT 13; Magnesium 1.9 12/05/2018: Hemoglobin 10.0; Platelets 392 12/10/2018: BUN 41; Creatinine, Ser 1.89; Potassium 4.1; Sodium 136  10/06/2018: Chol/HDL Ratio 6.1; Cholesterol, Total 236; HDL 39; LDL Calculated 163; Triglycerides 171   Estimated Creatinine Clearance: 52.9 mL/min (A) (by C-G formula based on SCr of 1.89 mg/dL (H)).   Wt Readings from Last 3 Encounters:  12/17/18 269 lb (122 kg)  12/05/18 267 lb 11.2 oz (121.4 kg)  12/03/18 269 lb 12.8 oz (122.4 kg)     Other studies reviewed: Additional studies/records reviewed today include: summarized above  ASSESSMENT AND PLAN:  1. CAD involving the native coronary arteries with stable angina: She continues to have stable exertional symptoms.  Increase Imdur to 60 mg daily.  Continue aspirin and Plavix.  Remains on Lopressor as below.  Continue Crestor as below however, plans will be to transition her from statin to PCSK9 inhibitor given her statin intolerance.  Schedule a staged PCI of the RCA with relook at the LCx with Dr. Fletcher Anon later this  month or early in February, 2020.  Following this, I recommend cardiac rehab.  Aggressive risk factor modification and secondary prevention.  2. Chronic combined CHF with subsequent normalization of EF: She continues to note weight gain and lower extremity swelling which are both significantly out of proportion to her volume status on physical exam and by laboratory evaluation.  Continue PRN Lasix and have advised the patient to use this sparingly.  I would not escalate Lasix to scheduled dosing at this time given she does not appear grossly volume overloaded.  We could consider right heart cath if her symptoms persist to better estimate volume status.  I suspect her lower extremity swelling is likely due to dependent edema from venous insufficiency and morbid obesity.  I have recommended the patient wear compression stockings and elevate her legs.  Most recent echocardiogram demonstrated preserved LV systolic function.  3. CKD stage II-III: Check CMP as below.  4. Hyperlipidemia: Most recent LDL of 163 from 09/2018 which was obtained while not on a statin.  Has previously had to discontinue Lipitor secondary to myalgias.  She is now on Crestor though continues to note myalgias and muscle fatigue.  We are checking a CMP, lipid, and direct LDL today.  Following review of these results I would recommend transitioning from statin therapy to Zetia as well as PCSK9 inhibitor given her statin intolerance.  This was reviewed with the patient today and her mother, both are agreeable.  5. Hypertension: Blood pressure is well controlled today.  Increase isosorbide mononitrate as above otherwise she will remain on Lopressor and PRN Lasix.  6. Insulin-dependent diabetes mellitus: Managed by PCP.  7. Morbid obesity/orthopnea: Weight loss is advised.  Following state intervention of the RCA as above, given her symptoms she may benefit from outpatient sleep study.  8. Iron deficiency anemia: Check CBC given dual  antiplatelet therapy.  Followed by PCP.  Disposition: F/u with Dr. Fletcher Anon or an APP in 1 month, which will be approximately 1 week following her staged PCI as above.  Current medicines are reviewed at length with the patient today.  The patient did not have any concerns regarding medicines.  Signed, Christell Faith, PA-C 12/17/2018 11:05 AM     [facility] [address] [place] [address] [place], [place] [address] [phone]

## 2018-12-17 ENCOUNTER — Other Ambulatory Visit: Payer: Self-pay | Admitting: Physician Assistant

## 2018-12-17 ENCOUNTER — Encounter: Payer: Self-pay | Admitting: Physician Assistant

## 2018-12-17 ENCOUNTER — Ambulatory Visit (INDEPENDENT_AMBULATORY_CARE_PROVIDER_SITE_OTHER): Payer: Medicare Other | Admitting: Physician Assistant

## 2018-12-17 VITALS — BP 120/70 | HR 93 | Ht 65.0 in | Wt 269.0 lb

## 2018-12-17 DIAGNOSIS — I25119 Atherosclerotic heart disease of native coronary artery with unspecified angina pectoris: Secondary | ICD-10-CM

## 2018-12-17 DIAGNOSIS — N183 Chronic kidney disease, stage 3 unspecified: Secondary | ICD-10-CM

## 2018-12-17 DIAGNOSIS — I200 Unstable angina: Secondary | ICD-10-CM | POA: Diagnosis not present

## 2018-12-17 DIAGNOSIS — Z794 Long term (current) use of insulin: Secondary | ICD-10-CM | POA: Diagnosis not present

## 2018-12-17 DIAGNOSIS — I25118 Atherosclerotic heart disease of native coronary artery with other forms of angina pectoris: Secondary | ICD-10-CM | POA: Diagnosis not present

## 2018-12-17 DIAGNOSIS — E785 Hyperlipidemia, unspecified: Secondary | ICD-10-CM

## 2018-12-17 DIAGNOSIS — D509 Iron deficiency anemia, unspecified: Secondary | ICD-10-CM

## 2018-12-17 DIAGNOSIS — E119 Type 2 diabetes mellitus without complications: Secondary | ICD-10-CM | POA: Diagnosis not present

## 2018-12-17 DIAGNOSIS — I5042 Chronic combined systolic (congestive) and diastolic (congestive) heart failure: Secondary | ICD-10-CM | POA: Diagnosis not present

## 2018-12-17 DIAGNOSIS — I5032 Chronic diastolic (congestive) heart failure: Secondary | ICD-10-CM | POA: Diagnosis not present

## 2018-12-17 DIAGNOSIS — I10 Essential (primary) hypertension: Secondary | ICD-10-CM | POA: Diagnosis not present

## 2018-12-17 MED ORDER — ISOSORBIDE MONONITRATE ER 30 MG PO TB24: 60.0000 mg | ORAL_TABLET | Freq: Every day | ORAL | 3 refills | Status: DC

## 2018-12-17 NOTE — Patient Instructions (Addendum)
Medication Instructions:  Your physician has recommended you make the following change in your medication:  1- INCREASE Imdur to 2 tablets (60 mg total) once daily  If you need a refill on your cardiac medications before your next appointment, please call your pharmacy.   Lab work: Your physician recommends that you return for lab work today (CMET, Lipid, direct LDL, CBC) If you have labs (blood work) drawn today and your tests are completely normal, you will receive your results only by: Marland Kitchen MyChart Message (if you have MyChart) OR . A paper copy in the mail If you have any lab test that is abnormal or we need to change your treatment, we will call you to review the results.  Testing/Procedures: 1- Staged PCI  Your physician has requested that you have a cardiac catheterization. Cardiac catheterization is used to diagnose and/or treat various heart conditions. Doctors may recommend this procedure for a number of different reasons. The most common reason is to evaluate chest pain. Chest pain can be a symptom of coronary artery disease (CAD), and cardiac catheterization can show whether plaque is narrowing or blocking your heart's arteries. This procedure is also used to evaluate the valves, as well as measure the blood flow and oxygen levels in different parts of your heart. For further information please visit [email or web address]. Please follow instruction sheet, as given.     [facility] [place], [place] [address] Dept: [phone] Loc: [place]  12/17/2018  You are scheduled for a Cardiac Catheterization on Tuesday, February 4 with Dr. Harrell Gave End.  1. Please arrive at the [place] at 7:30 AM (This time is 1 HR before your procedure to ensure your preparation). Free valet parking service is available.   Special note: Every effort is made to have your  procedure done on time. Please understand that emergencies sometimes delay scheduled procedures.  2. Diet: Do not eat solid foods after midnight.  The patient may have clear liquids until 5am upon the day of the procedure.  3. Labs: You will need to have blood drawn today.   4. Medication instructions in preparation for your procedure:  Stop taking, Lasix (Furosemide)  Monday, February 3,  Take 1/2 dose of Lantus on the night before procedure and hold all insulin the morning of your procedure.   Do not take Diabetes Med n on the day of the procedure and HOLD 48 HOURS AFTER THE PROCEDURE.  - Glimepiride and Farxiga  On the morning of your procedure, take your Aspirin and any morning medicines NOT listed above.  You may use sips of water.  5. Plan for one night stay--bring personal belongings. 6. Bring a current list of your medications and current insurance cards. 7. You MUST have a responsible person to drive you home. 8. Someone MUST be with you the first 24 hours after you arrive home or your discharge will be delayed. 9. Please wear clothes that are easy to get on and off and wear slip-on shoes.  Thank you for allowing [place] to care for you!   -- [facility]   Follow-Up: At [facility], you and your health needs are our priority.  As part of our continuing mission to provide you with exceptional heart care, we have created designated Provider Care Teams.  These Care Teams include your primary Cardiologist (physician) and Advanced Practice Providers (APPs -  Physician Assistants and  Nurse Practitioners) who all work together to provide you with the care you need, when you need it. You will need a follow up appointment in 6 weeks.  You may see Kathlyn Sacramento, MD or one of the following Advanced Practice Providers on your designated Care Team:   Murray Hodgkins, NP Christell Faith, PA-C . Marrianne Mood, PA-C

## 2018-12-18 LAB — CBC
Hematocrit: 33.4 % — ABNORMAL LOW (ref 34.0–46.6)
Hemoglobin: 10.4 g/dL — ABNORMAL LOW (ref 11.1–15.9)
MCH: 24.5 pg — ABNORMAL LOW (ref 26.6–33.0)
MCHC: 31.1 g/dL — ABNORMAL LOW (ref 31.5–35.7)
MCV: 79 fL (ref 79–97)
Platelets: 500 10*3/uL — ABNORMAL HIGH (ref 150–450)
RBC: 4.25 x10E6/uL (ref 3.77–5.28)
RDW: 16.1 % — ABNORMAL HIGH (ref 11.7–15.4)
WBC: 7.2 10*3/uL (ref 3.4–10.8)

## 2018-12-18 LAB — LIPID PANEL
Chol/HDL Ratio: 4.6 ratio — ABNORMAL HIGH (ref 0.0–4.4)
Cholesterol, Total: 245 mg/dL — ABNORMAL HIGH (ref 100–199)
HDL: 53 mg/dL (ref >39)
LDL Calculated: 165 mg/dL — ABNORMAL HIGH (ref 0–99)
Triglycerides: 137 mg/dL (ref 0–149)
VLDL Cholesterol Cal: 27 mg/dL (ref 5–40)

## 2018-12-18 LAB — COMPREHENSIVE METABOLIC PANEL
ALT: 35 IU/L — ABNORMAL HIGH (ref 0–32)
AST: 30 IU/L (ref 0–40)
Albumin/Globulin Ratio: 1 — ABNORMAL LOW (ref 1.2–2.2)
Albumin: 3.3 g/dL — ABNORMAL LOW (ref 3.5–5.5)
Alkaline Phosphatase: 240 IU/L — ABNORMAL HIGH (ref 39–117)
BUN/Creatinine Ratio: 20 (ref 9–23)
BUN: 27 mg/dL — ABNORMAL HIGH (ref 6–20)
Bilirubin Total: <0.2 mg/dL (ref 0.0–1.2)
CO2: 17 mmol/L — ABNORMAL LOW (ref 20–29)
Calcium: 9 mg/dL (ref 8.7–10.2)
Chloride: 110 mmol/L — ABNORMAL HIGH (ref 96–106)
Creatinine, Ser: 1.36 mg/dL — ABNORMAL HIGH (ref 0.57–1.00)
GFR calc Af Amer: 57 mL/min/{1.73_m2} — ABNORMAL LOW (ref >59)
GFR calc non Af Amer: 49 mL/min/{1.73_m2} — ABNORMAL LOW (ref >59)
Globulin, Total: 3.3 g/dL (ref 1.5–4.5)
Glucose: 180 mg/dL — ABNORMAL HIGH (ref 65–99)
Potassium: 4.8 mmol/L (ref 3.5–5.2)
Sodium: 142 mmol/L (ref 134–144)
Total Protein: 6.6 g/dL (ref 6.0–8.5)

## 2018-12-18 LAB — LDL CHOLESTEROL, DIRECT: LDL Direct: 163 mg/dL — ABNORMAL HIGH (ref 0–99)

## 2018-12-21 ENCOUNTER — Telehealth: Payer: Self-pay | Admitting: *Deleted

## 2018-12-21 NOTE — Telephone Encounter (Signed)
No answer. Left message to call back.   

## 2018-12-21 NOTE — Telephone Encounter (Signed)
-----   Message from Rise Mu, PA-C sent at 12/18/2018 10:00 AM EST ----- Random glucose is elevated with known diabetes.  Renal function has improved and is now back to her approximate baseline. A couple liver enzymes are mildly elevated. These are not specific.  LDL remains significantly elevated at 163 with a goal of < 70.  HGB is low, though stable.  Platelet count is mildly elevated.   1) Follow up with PCP for diabetes and thrombocytosis.  2) Given her statin intolerance (Liptior and Crestor) with elevated LFT, please stop Crestor. I recommend she contact her insurance company to see which of the PCSK-9 inhibitors is preferred with her plan and let us know. At that time, we can start her on one of them. She will need a recheck lipid and liver function 8 weeks after starting PCSK-9 inhibitor.  3) Continue to take Lasix only as needed.

## 2018-12-22 NOTE — Telephone Encounter (Signed)
Results called to pt. Pt verbalized understanding of all results and plan of care. She is aware to stop Crestor, take furosemide as needed and to call insurance to see what PCSK-9 inhibitor is recommended. She will call us back to let us know once she finds out. Med list updated.

## 2018-12-24 ENCOUNTER — Other Ambulatory Visit: Payer: Self-pay

## 2018-12-24 MED ORDER — INSULIN PEN NEEDLE 32G X 4 MM MISC: 1.0000 [IU] | Freq: Every morning | 3 refills | Status: DC

## 2019-01-05 ENCOUNTER — Encounter: Payer: Self-pay | Admitting: Nurse Practitioner

## 2019-01-05 DIAGNOSIS — Z794 Long term (current) use of insulin: Secondary | ICD-10-CM | POA: Insufficient documentation

## 2019-01-05 DIAGNOSIS — E1143 Type 2 diabetes mellitus with diabetic autonomic (poly)neuropathy: Secondary | ICD-10-CM | POA: Diagnosis not present

## 2019-01-05 DIAGNOSIS — D75839 Thrombocytosis, unspecified: Secondary | ICD-10-CM | POA: Insufficient documentation

## 2019-01-05 DIAGNOSIS — D473 Essential (hemorrhagic) thrombocythemia: Secondary | ICD-10-CM | POA: Insufficient documentation

## 2019-01-05 DIAGNOSIS — E1169 Type 2 diabetes mellitus with other specified complication: Secondary | ICD-10-CM | POA: Diagnosis not present

## 2019-01-05 DIAGNOSIS — E1165 Type 2 diabetes mellitus with hyperglycemia: Secondary | ICD-10-CM | POA: Diagnosis not present

## 2019-01-05 DIAGNOSIS — R809 Proteinuria, unspecified: Secondary | ICD-10-CM | POA: Diagnosis not present

## 2019-01-05 DIAGNOSIS — E782 Mixed hyperlipidemia: Secondary | ICD-10-CM | POA: Diagnosis not present

## 2019-01-05 DIAGNOSIS — E1159 Type 2 diabetes mellitus with other circulatory complications: Secondary | ICD-10-CM | POA: Diagnosis not present

## 2019-01-05 DIAGNOSIS — E669 Obesity, unspecified: Secondary | ICD-10-CM | POA: Diagnosis not present

## 2019-01-06 ENCOUNTER — Other Ambulatory Visit: Payer: Self-pay

## 2019-01-06 ENCOUNTER — Ambulatory Visit: Payer: Medicare Other | Admitting: Nurse Practitioner

## 2019-01-06 ENCOUNTER — Encounter: Payer: Self-pay | Admitting: Nurse Practitioner

## 2019-01-06 VITALS — BP 139/75 | HR 84 | Temp 98.4°F | Ht 65.0 in | Wt 280.0 lb

## 2019-01-06 DIAGNOSIS — D473 Essential (hemorrhagic) thrombocythemia: Secondary | ICD-10-CM | POA: Diagnosis not present

## 2019-01-06 DIAGNOSIS — E785 Hyperlipidemia, unspecified: Secondary | ICD-10-CM | POA: Diagnosis not present

## 2019-01-06 DIAGNOSIS — I130 Hypertensive heart and chronic kidney disease with heart failure and stage 1 through stage 4 chronic kidney disease, or unspecified chronic kidney disease: Secondary | ICD-10-CM

## 2019-01-06 DIAGNOSIS — N183 Chronic kidney disease, stage 3 unspecified: Secondary | ICD-10-CM

## 2019-01-06 DIAGNOSIS — I200 Unstable angina: Secondary | ICD-10-CM

## 2019-01-06 DIAGNOSIS — E1165 Type 2 diabetes mellitus with hyperglycemia: Secondary | ICD-10-CM

## 2019-01-06 DIAGNOSIS — D75839 Thrombocytosis, unspecified: Secondary | ICD-10-CM

## 2019-01-06 DIAGNOSIS — E1169 Type 2 diabetes mellitus with other specified complication: Secondary | ICD-10-CM | POA: Diagnosis not present

## 2019-01-06 DIAGNOSIS — R109 Unspecified abdominal pain: Secondary | ICD-10-CM | POA: Diagnosis not present

## 2019-01-06 NOTE — Assessment & Plan Note (Signed)
CBC, ESR, CRP today.  Consider referral to hematology or rheumatology if continued elevations noted.

## 2019-01-06 NOTE — Assessment & Plan Note (Signed)
Followed by Dr. Gabriel Carina with recent visit and medication changes.  Recent A1C 9.8%.  Continue to collaborate with endocrinology.

## 2019-01-06 NOTE — Progress Notes (Signed)
BP 139/75   Pulse 84   Temp 98.4 F (36.9 C) (Oral)   Ht _0  (1.651 m)   Wt 280 lb (127 kg)   SpO2 93%   BMI 46.59 kg/m    Subjective:    Patient ID: [name] [name], female    DOB: 1981/07/03, 38 y.o.   MRN: [id number]  HPI: Biana Haggar is a 38 y.o. female presents for follow-up  Chief Complaint  Patient presents with  . Diabetes    f/u  . Hypertension  . Hyperlipidemia   DIABETES Followed by Dr. Gabriel Carina with last visit 01/05/19 and A1C 9.8 with medication adjustments made. Hypoglycemic episodes:yes Polydipsia/polyuria: no Visual disturbance: no Chest pain: no Paresthesias: no Glucose Monitoring: yes  Accucheck frequency: TID  Fasting glucose: low 100's  Post prandial:  Evening: 140's and night time 220-240  Before meals: Taking Insulin?: yes  Long acting insulin: Lantus  Short acting insulin: Novolog Blood Pressure Monitoring: daily Retinal Examination: Up to Date Foot Exam: Up to Date Pneumovax: Up to Date Influenza: Up to Date Aspirin: yes   HYPERTENSION / HYPERLIPIDEMIA Followed by cardiology.  During recent visit with cardiology was noted to have elevated PLT on labs, 500 which she was told to discuss with this provider.  On review of records this has been waxing and waning over past year with range PLT 12/22/17 = 458, 01/09/18 = 479, 01/21/18 = 392, 01/24/18 = 349.  Have range from 349 to 500 over past year with HGB in 10-11 range and HCT 32-35 range.  In 2017 and 2018 she was noted to have slight elevation in ESR 37-38 & CRP 5.8 and 26.2.  Patient does report at baseline she has ongoing joint discomfort and fatigue for past year. Satisfied with current treatment? yes Duration of hypertension: chronic BP monitoring frequency: daily BP range: 120/80's at home, occasionally higher BP medication side effects: no Duration of hyperlipidemia: chronic Cholesterol medication side effects: no Cholesterol supplements: none Medication compliance: good  compliance Aspirin: yes Recent stressors: no Recurrent headaches: no Visual changes: no Palpitations: no Dyspnea: continues to have baseline SOB Chest pain: no Lower extremity edema: yes Dizzy/lightheaded: no  Relevant past medical, surgical, family and social history reviewed and updated as indicated. Interim medical history since our last visit reviewed. Allergies and medications reviewed and updated.  Review of Systems  Constitutional: Negative for activity change, appetite change, diaphoresis, fatigue and fever.  Respiratory: Positive for shortness of breath (at baseline, no worse). Negative for cough and chest tightness.   Cardiovascular: Positive for leg swelling (at baseline, no worse). Negative for chest pain and palpitations.  Gastrointestinal: Negative for abdominal distention, abdominal pain, constipation, diarrhea, nausea and vomiting.  Endocrine: Negative for cold intolerance, heat intolerance, polydipsia, polyphagia and polyuria.  Neurological: Negative for dizziness, syncope, weakness, light-headedness, numbness and headaches.  Psychiatric/Behavioral: Negative.     Per HPI unless specifically indicated above     Objective:    BP 139/75   Pulse 84   Temp 98.4 F (36.9 C) (Oral)   Ht _1  (1.651 m)   Wt 280 lb (127 kg)   SpO2 93%   BMI 46.59 kg/m   Wt Readings from Last 3 Encounters:  01/06/19 280 lb (127 kg)  12/17/18 269 lb (122 kg)  12/05/18 267 lb 11.2 oz (121.4 kg)    Physical Exam Vitals signs and nursing note reviewed.  Constitutional:      Appearance: She is well-developed.  HENT:     Head:  Normocephalic.  Eyes:     General:        Right eye: No discharge.        Left eye: No discharge.     Conjunctiva/sclera: Conjunctivae normal.     Pupils: Pupils are equal, round, and reactive to light.  Neck:     Musculoskeletal: Normal range of motion and neck supple.     Thyroid: No thyromegaly.     Vascular: No carotid bruit or JVD.   Cardiovascular:     Rate and Rhythm: Normal rate and regular rhythm.     Heart sounds: Normal heart sounds.  Pulmonary:     Effort: Pulmonary effort is normal.     Breath sounds: Normal breath sounds.  Abdominal:     General: Bowel sounds are normal.     Palpations: Abdomen is soft.  Lymphadenopathy:     Cervical: No cervical adenopathy.  Skin:    General: Skin is warm and dry.  Neurological:     Mental Status: She is alert and oriented to person, place, and time.  Psychiatric:        Mood and Affect: Mood normal.        Behavior: Behavior normal.        Thought Content: Thought content normal.        Judgment: Judgment normal.     Results for orders placed or performed in visit on 12/17/18  Comprehensive metabolic panel  Result Value Ref Range   Glucose 180 (H) 65 - 99 mg/dL   BUN 27 (H) 6 - 20 mg/dL   Creatinine, Ser 1.36 (H) 0.57 - 1.00 mg/dL   GFR calc non Af Amer 49 (L) >59 mL/min/1.73   GFR calc Af Amer 57 (L) >59 mL/min/1.73   BUN/Creatinine Ratio 20 9 - 23   Sodium 142 134 - 144 mmol/L   Potassium 4.8 3.5 - 5.2 mmol/L   Chloride 110 (H) 96 - 106 mmol/L   CO2 17 (L) 20 - 29 mmol/L   Calcium 9.0 8.7 - 10.2 mg/dL   Total Protein 6.6 6.0 - 8.5 g/dL   Albumin 3.3 (L) 3.5 - 5.5 g/dL   Globulin, Total 3.3 1.5 - 4.5 g/dL   Albumin/Globulin Ratio 1.0 (L) 1.2 - 2.2   Bilirubin Total <0.2 0.0 - 1.2 mg/dL   Alkaline Phosphatase 240 (H) 39 - 117 IU/L   AST 30 0 - 40 IU/L   ALT 35 (H) 0 - 32 IU/L  Lipid panel  Result Value Ref Range   Cholesterol, Total 245 (H) 100 - 199 mg/dL   Triglycerides 137 0 - 149 mg/dL   HDL 53 >39 mg/dL   VLDL Cholesterol Cal 27 5 - 40 mg/dL   LDL Calculated 165 (H) 0 - 99 mg/dL   Chol/HDL Ratio 4.6 (H) 0.0 - 4.4 ratio  LDL cholesterol, direct  Result Value Ref Range   LDL Direct 163 (H) 0 - 99 mg/dL  CBC  Result Value Ref Range   WBC 7.2 3.4 - 10.8 x10E3/uL   RBC 4.25 3.77 - 5.28 x10E6/uL   Hemoglobin 10.4 (L) 11.1 - 15.9 g/dL    Hematocrit 33.4 (L) 34.0 - 46.6 %   MCV 79 79 - 97 fL   MCH 24.5 (L) 26.6 - 33.0 pg   MCHC 31.1 (L) 31.5 - 35.7 g/dL   RDW 16.1 (H) 11.7 - 15.4 %   Platelets 500 (H) 150 - 450 x10E3/uL      Assessment & Plan:  Problem List Items Addressed This Visit      Cardiovascular and Mediastinum   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  Continue collaboration with cardiology.        Endocrine   Hyperlipidemia associated with type 2 diabetes mellitus ([place])    Poor tolerance to all statins.  Followed by cardiology and there was recent discussion of addition of PCKS-9, which patient is to look into with insurance company.  Continue to collaborate with cardiology.      Poorly controlled type 2 diabetes mellitus ([place])    Followed by Dr. Gabriel Carina with recent visit and medication changes.  Recent A1C 9.8%.  Continue to collaborate with endocrinology.        Hematopoietic and Hemostatic   Thrombocytosis ([facility]) - Primary    CBC, ESR, CRP today.  Consider referral to hematology or rheumatology if continued elevations noted.      Relevant Orders   CBC w/Diff   Sed Rate (ESR)   C-reactive protein       Follow up plan: Return in about 3 months (around 04/07/2019) for T2DM, HTN/HLD.

## 2019-01-06 NOTE — Assessment & Plan Note (Signed)
Followed by cardiology with upcoming stent placement scheduled.  Continue collaboration with cardiology.

## 2019-01-06 NOTE — Patient Instructions (Signed)
Platelet Count Test Why am I having this test? Platelets are specialized cells that help the blood clot. When you get a tissue injury like a cut, platelets gather at the site of the injury to stop the bleeding. You may have a platelet count test:  If you have symptoms that may be related to excess bleeding or delayed blood clotting, such as: ? A rash of pinprick-sized red and purple dots on the skin (petechiae). These are small collections of blood (hemorrhages) in the skin. ? Heavy menstrual bleeding.  To help monitor treatment for: ? Thrombocytopenia. This is a condition in which you have a low platelet count. ? Bone marrow failure. What is being tested? This test measures how many platelets you have within a specific amount (volume) of blood. What kind of sample is taken?  A blood sample is required for this test. It is usually collected by inserting a needle into a blood vessel or by sticking a finger with a small needle. Tell a health care provider about:  Any allergies you have.  All medicines you are taking, including vitamins, herbs, eye drops, creams, and over-the-counter medicines.  Any blood disorders you have.  Any surgeries you have had.  Any medical conditions you have.  Whether you are pregnant or may be pregnant. How are the results reported? Your test results will be reported as a value that indicates how many platelets are in the blood volume. This will be given as platelets per cubic millimeter (mm3) of blood. Your health care provider will compare your results to normal ranges that were established after testing a large group of people (reference ranges). Reference ranges may vary among labs and hospitals. For this test, common reference ranges are:  Adult or elderly: 150,000-400,000/mm3.  Child: 150,000-400,000/mm3.  Infant: 200,000-475,000/mm3.  Premature infant: 100,000-300,000/mm3.  Newborn: 150,000-300,000/mm3. What do the results mean? A result  that is within your reference range is considered normal, meaning that you have a normal amount of platelets in your blood. A result that is higher than your reference range means that you have too many platelets in your blood. This may mean that you have:  Certain types of cancer, such as leukemia or lymphoma.  A condition in which the bone marrow produces excess amounts of all cell types, including platelets (polycythemia vera).  A condition that can occur after surgery to remove the spleen (post-splenectomy syndrome).  Rheumatoid arthritis.  Anemia due to lack of iron (iron-deficiency anemia). A result that is lower than your reference range means that you have too few platelets in your blood. This may mean that you have:  A condition in which the spleen breaks down platelets faster than normal (hypersplenism).  A hemorrhage somewhere in your body.  Low platelet count due to your body's disease-fighting system attacking your platelets (immune thrombocytopenia).  Cancer. Chemotherapy treatments for cancers such as leukemia can also cause low platelet count.  A rare, serious form of thrombocytopenia that causes blood clots (thrombotic thrombocytopenia).  HELLP syndrome, a disorder of pregnancy that causes high blood pressure and other serious problems.  Certain disorders that are passed from parent to child (inherited) that cause a low platelet count.  A condition in which the proteins that control blood clotting are overactive, causing abnormal clotting processes to occur (disseminated intravascular coagulation, DIC).  A disease that causes long-term inflammation and pain in many parts of the body (systemic lupus erythematosus, SLE).  Certain types of anemia, such as pernicious anemia or hemolytic anemia.    Infection. Talk with your health care provider about what your results mean. Questions to ask your health care provider Ask your health care provider, or the department that  is doing the test:  When will my results be ready?  How will I get my results?  What are my treatment options?  What other tests do I need?  What are my next steps? Summary  Platelets are specialized cells that help the blood clot. When you get a tissue injury like a cut, platelets gather at the site of the injury to stop the bleeding.  This test measures how many platelets you have within a specific amount (volume) of blood.  Talk with your health care provider about what your results mean. This information is not intended to replace advice given to you by your health care provider. Make sure you discuss any questions you have with your health care provider. Document Released: 12/20/2004 Document Revised: 08/18/2017 Document Reviewed: 08/18/2017 Elsevier Interactive Patient Education  2019 [employer].

## 2019-01-06 NOTE — Assessment & Plan Note (Signed)
Poor tolerance to all statins.  Followed by cardiology and there was recent discussion of addition of PCKS-9, which patient is to look into with insurance company.  Continue to collaborate with cardiology.

## 2019-01-07 LAB — CBC WITH DIFFERENTIAL/PLATELET
Basophils Absolute: 0 10*3/uL (ref 0.0–0.2)
Basos: 0 %
EOS (ABSOLUTE): 0.3 10*3/uL (ref 0.0–0.4)
Eos: 3 %
Hematocrit: 32.5 % — ABNORMAL LOW (ref 34.0–46.6)
Hemoglobin: 10 g/dL — ABNORMAL LOW (ref 11.1–15.9)
Immature Grans (Abs): 0 10*3/uL (ref 0.0–0.1)
Immature Granulocytes: 0 %
Lymphocytes Absolute: 2.4 10*3/uL (ref 0.7–3.1)
Lymphs: 27 %
MCH: 24.6 pg — ABNORMAL LOW (ref 26.6–33.0)
MCHC: 30.8 g/dL — ABNORMAL LOW (ref 31.5–35.7)
MCV: 80 fL (ref 79–97)
Monocytes Absolute: 0.3 10*3/uL (ref 0.1–0.9)
Monocytes: 4 %
Neutrophils Absolute: 5.9 10*3/uL (ref 1.4–7.0)
Neutrophils: 66 %
Platelets: 449 10*3/uL (ref 150–450)
RBC: 4.06 x10E6/uL (ref 3.77–5.28)
RDW: 15.8 % — ABNORMAL HIGH (ref 11.7–15.4)
WBC: 8.9 10*3/uL (ref 3.4–10.8)

## 2019-01-07 LAB — SEDIMENTATION RATE: Sed Rate: 18 mm/hr (ref 0–32)

## 2019-01-07 LAB — C-REACTIVE PROTEIN: CRP: 29 mg/L — ABNORMAL HIGH (ref 0–10)

## 2019-01-11 ENCOUNTER — Telehealth: Payer: Self-pay | Admitting: Nurse Practitioner

## 2019-01-11 DIAGNOSIS — D75839 Thrombocytosis, unspecified: Secondary | ICD-10-CM

## 2019-01-11 DIAGNOSIS — D473 Essential (hemorrhagic) thrombocythemia: Secondary | ICD-10-CM

## 2019-01-11 NOTE — Telephone Encounter (Signed)
Spoke to [name] via telephone and reviewed recent lab results.  Discussed history with patient and reviewed past labs with Dr. Wynetta Emery.  Paitent agrees to hematology referral for further work-up based on her history of intermittent elevation in PLT and CRP/ESR.  Referral placed.

## 2019-01-12 ENCOUNTER — Ambulatory Visit
Admission: RE | Admit: 2019-01-12 | Discharge: 2019-01-13 | Disposition: A | Discharge status: Home / Self Care | Payer: Medicare Other | Attending: Internal Medicine | Admitting: Internal Medicine

## 2019-01-12 ENCOUNTER — Other Ambulatory Visit: Payer: Self-pay

## 2019-01-12 ENCOUNTER — Encounter
Admission: RE | Disposition: A | Discharge status: Home / Self Care | Payer: Self-pay | Source: Home / Self Care | Attending: Internal Medicine

## 2019-01-12 DIAGNOSIS — I25118 Atherosclerotic heart disease of native coronary artery with other forms of angina pectoris: Secondary | ICD-10-CM | POA: Diagnosis not present

## 2019-01-12 DIAGNOSIS — N183 Chronic kidney disease, stage 3 (moderate): Secondary | ICD-10-CM | POA: Insufficient documentation

## 2019-01-12 DIAGNOSIS — D509 Iron deficiency anemia, unspecified: Secondary | ICD-10-CM | POA: Insufficient documentation

## 2019-01-12 DIAGNOSIS — I130 Hypertensive heart and chronic kidney disease with heart failure and stage 1 through stage 4 chronic kidney disease, or unspecified chronic kidney disease: Secondary | ICD-10-CM | POA: Insufficient documentation

## 2019-01-12 DIAGNOSIS — Z7902 Long term (current) use of antithrombotics/antiplatelets: Secondary | ICD-10-CM | POA: Diagnosis not present

## 2019-01-12 DIAGNOSIS — E785 Hyperlipidemia, unspecified: Secondary | ICD-10-CM | POA: Diagnosis not present

## 2019-01-12 DIAGNOSIS — Z6841 Body Mass Index (BMI) 40.0 and over, adult: Secondary | ICD-10-CM | POA: Insufficient documentation

## 2019-01-12 DIAGNOSIS — E1143 Type 2 diabetes mellitus with diabetic autonomic (poly)neuropathy: Secondary | ICD-10-CM | POA: Diagnosis not present

## 2019-01-12 DIAGNOSIS — Z79899 Other long term (current) drug therapy: Secondary | ICD-10-CM | POA: Insufficient documentation

## 2019-01-12 DIAGNOSIS — Z87891 Personal history of nicotine dependence: Secondary | ICD-10-CM | POA: Insufficient documentation

## 2019-01-12 DIAGNOSIS — Z7982 Long term (current) use of aspirin: Secondary | ICD-10-CM | POA: Diagnosis not present

## 2019-01-12 DIAGNOSIS — K3184 Gastroparesis: Secondary | ICD-10-CM | POA: Insufficient documentation

## 2019-01-12 DIAGNOSIS — I5032 Chronic diastolic (congestive) heart failure: Secondary | ICD-10-CM | POA: Diagnosis not present

## 2019-01-12 DIAGNOSIS — I208 Other forms of angina pectoris: Secondary | ICD-10-CM | POA: Diagnosis present

## 2019-01-12 DIAGNOSIS — E669 Obesity, unspecified: Secondary | ICD-10-CM | POA: Insufficient documentation

## 2019-01-12 DIAGNOSIS — E1122 Type 2 diabetes mellitus with diabetic chronic kidney disease: Secondary | ICD-10-CM | POA: Diagnosis not present

## 2019-01-12 DIAGNOSIS — I2510 Atherosclerotic heart disease of native coronary artery without angina pectoris: Secondary | ICD-10-CM

## 2019-01-12 DIAGNOSIS — E559 Vitamin D deficiency, unspecified: Secondary | ICD-10-CM | POA: Insufficient documentation

## 2019-01-12 DIAGNOSIS — I252 Old myocardial infarction: Secondary | ICD-10-CM | POA: Diagnosis not present

## 2019-01-12 DIAGNOSIS — I200 Unstable angina: Secondary | ICD-10-CM

## 2019-01-12 DIAGNOSIS — Z9861 Coronary angioplasty status: Secondary | ICD-10-CM | POA: Diagnosis not present

## 2019-01-12 DIAGNOSIS — Z888 Allergy status to other drugs, medicaments and biological substances status: Secondary | ICD-10-CM | POA: Insufficient documentation

## 2019-01-12 HISTORY — PX: LEFT HEART CATH AND CORONARY ANGIOGRAPHY: CATH118249

## 2019-01-12 HISTORY — PX: CORONARY STENT INTERVENTION: CATH118234

## 2019-01-12 LAB — CBC
HCT: 31.2 % — ABNORMAL LOW (ref 36.0–46.0)
Hemoglobin: 9.7 g/dL — ABNORMAL LOW (ref 12.0–15.0)
MCH: 24.7 pg — ABNORMAL LOW (ref 26.0–34.0)
MCHC: 31.1 g/dL (ref 30.0–36.0)
MCV: 79.4 fL — ABNORMAL LOW (ref 80.0–100.0)
Platelets: 417 10*3/uL — ABNORMAL HIGH (ref 150–400)
RBC: 3.93 MIL/uL (ref 3.87–5.11)
RDW: 16.5 % — ABNORMAL HIGH (ref 11.5–15.5)
WBC: 7.7 10*3/uL (ref 4.0–10.5)
nRBC: 0.3 % — ABNORMAL HIGH (ref 0.0–0.2)

## 2019-01-12 LAB — GLUCOSE, CAPILLARY
Glucose-Capillary: 215 mg/dL — ABNORMAL HIGH (ref 70–99)
Glucose-Capillary: 170 mg/dL — ABNORMAL HIGH (ref 70–99)
Glucose-Capillary: 106 mg/dL — ABNORMAL HIGH (ref 70–99)
Glucose-Capillary: 175 mg/dL — ABNORMAL HIGH (ref 70–99)

## 2019-01-12 LAB — CREATININE, SERUM
Creatinine, Ser: 1.32 mg/dL — ABNORMAL HIGH (ref 0.44–1.00)
GFR calc Af Amer: 59 mL/min — ABNORMAL LOW (ref >60)
GFR calc non Af Amer: 51 mL/min — ABNORMAL LOW (ref >60)

## 2019-01-12 LAB — MRSA PCR SCREENING: MRSA by PCR: POSITIVE — AB

## 2019-01-12 LAB — PREGNANCY, URINE: Preg Test, Ur: NEGATIVE

## 2019-01-12 SURGERY — CORONARY STENT INTERVENTION
Anesthesia: Moderate Sedation

## 2019-01-12 MED ORDER — PANTOPRAZOLE SODIUM 40 MG PO TBEC
40.0000 mg | DELAYED_RELEASE_TABLET | Freq: Two times a day (BID) | ORAL | Status: DC
  Administered 2019-01-12 – 2019-01-13 (×2): 40 mg via ORAL
  Filled 2019-01-12 (×2): qty 1

## 2019-01-12 MED ORDER — SODIUM CHLORIDE 0.9% FLUSH
3.0000 mL | Freq: Two times a day (BID) | INTRAVENOUS | Status: DC
  Administered 2019-01-12 (×2): 3 mL via INTRAVENOUS

## 2019-01-12 MED ORDER — LABETALOL HCL 5 MG/ML IV SOLN
10.0000 mg | INTRAVENOUS | Status: AC | PRN
  Administered 2019-01-12 (×2): 10 mg via INTRAVENOUS

## 2019-01-12 MED ORDER — HYDRALAZINE HCL 20 MG/ML IJ SOLN
INTRAMUSCULAR | Status: AC
  Filled 2019-01-12: qty 1

## 2019-01-12 MED ORDER — DULOXETINE HCL 60 MG PO CPEP
60.0000 mg | ORAL_CAPSULE | Freq: Every day | ORAL | Status: DC
  Administered 2019-01-13: 60 mg via ORAL
  Filled 2019-01-12: qty 2
  Filled 2019-01-12: qty 1

## 2019-01-12 MED ORDER — IOPAMIDOL (ISOVUE-300) INJECTION 61%
INTRAVENOUS | Status: DC | PRN
  Administered 2019-01-12: 75 mL via INTRA_ARTERIAL

## 2019-01-12 MED ORDER — METOPROLOL TARTRATE 50 MG PO TABS
50.0000 mg | ORAL_TABLET | Freq: Two times a day (BID) | ORAL | Status: DC
  Administered 2019-01-12 – 2019-01-13 (×3): 50 mg via ORAL
  Filled 2019-01-12 (×5): qty 1

## 2019-01-12 MED ORDER — MIDAZOLAM HCL 2 MG/2ML IJ SOLN
INTRAMUSCULAR | Status: AC
  Filled 2019-01-12: qty 2

## 2019-01-12 MED ORDER — ACETAMINOPHEN 325 MG PO TABS
650.0000 mg | ORAL_TABLET | ORAL | Status: DC | PRN
  Administered 2019-01-12: 650 mg via ORAL

## 2019-01-12 MED ORDER — CLOPIDOGREL BISULFATE 75 MG PO TABS
ORAL_TABLET | ORAL | Status: AC
  Filled 2019-01-12: qty 4

## 2019-01-12 MED ORDER — SODIUM CHLORIDE 0.9% FLUSH
3.0000 mL | INTRAVENOUS | Status: DC | PRN
  Administered 2019-01-12: 3 mL via INTRAVENOUS
  Filled 2019-01-12: qty 3

## 2019-01-12 MED ORDER — CHLORHEXIDINE GLUCONATE CLOTH 2 % EX PADS: 6.0000 | MEDICATED_PAD | Freq: Every day | CUTANEOUS | Status: DC

## 2019-01-12 MED ORDER — SODIUM CHLORIDE 0.9 % WEIGHT BASED INFUSION
3.0000 mL/kg/h | INTRAVENOUS | Status: DC
  Administered 2019-01-12: 3 mL/kg/h via INTRAVENOUS

## 2019-01-12 MED ORDER — HYDRALAZINE HCL 20 MG/ML IJ SOLN
5.0000 mg | INTRAMUSCULAR | Status: AC | PRN
  Administered 2019-01-12 (×2): 5 mg via INTRAVENOUS

## 2019-01-12 MED ORDER — VERAPAMIL HCL 2.5 MG/ML IV SOLN
INTRAVENOUS | Status: AC
  Filled 2019-01-12: qty 2

## 2019-01-12 MED ORDER — ONDANSETRON HCL 4 MG/2ML IJ SOLN: 4.0000 mg | Freq: Four times a day (QID) | INTRAMUSCULAR | Status: DC | PRN

## 2019-01-12 MED ORDER — FENTANYL CITRATE (PF) 100 MCG/2ML IJ SOLN
INTRAMUSCULAR | Status: AC
  Filled 2019-01-12: qty 2

## 2019-01-12 MED ORDER — SODIUM CHLORIDE 0.9% FLUSH: 3.0000 mL | INTRAVENOUS | Status: DC | PRN

## 2019-01-12 MED ORDER — HEPARIN SODIUM (PORCINE) 1000 UNIT/ML IJ SOLN
INTRAMUSCULAR | Status: AC
  Filled 2019-01-12: qty 1

## 2019-01-12 MED ORDER — LABETALOL HCL 5 MG/ML IV SOLN
INTRAVENOUS | Status: AC
  Administered 2019-01-12: 10 mg via INTRAVENOUS
  Filled 2019-01-12: qty 4

## 2019-01-12 MED ORDER — SODIUM CHLORIDE 0.9 % IV SOLN: 250.0000 mL | INTRAVENOUS | Status: DC | PRN

## 2019-01-12 MED ORDER — HEPARIN (PORCINE) IN NACL 1000-0.9 UT/500ML-% IV SOLN
INTRAVENOUS | Status: AC
  Filled 2019-01-12: qty 1000

## 2019-01-12 MED ORDER — CLOPIDOGREL BISULFATE 75 MG PO TABS
75.0000 mg | ORAL_TABLET | Freq: Every day | ORAL | Status: DC
  Administered 2019-01-13: 75 mg via ORAL
  Filled 2019-01-12: qty 1

## 2019-01-12 MED ORDER — ASPIRIN 81 MG PO CHEW
81.0000 mg | CHEWABLE_TABLET | ORAL | Status: AC
  Administered 2019-01-12: 81 mg via ORAL

## 2019-01-12 MED ORDER — ISOSORBIDE MONONITRATE ER 60 MG PO TB24
60.0000 mg | ORAL_TABLET | Freq: Every day | ORAL | Status: DC
  Administered 2019-01-12 – 2019-01-13 (×2): 60 mg via ORAL
  Filled 2019-01-12 (×2): qty 1

## 2019-01-12 MED ORDER — BENZONATATE 100 MG PO CAPS: 100.0000 mg | ORAL_CAPSULE | Freq: Two times a day (BID) | ORAL | Status: DC | PRN

## 2019-01-12 MED ORDER — CLOPIDOGREL BISULFATE 75 MG PO TABS
ORAL_TABLET | ORAL | Status: DC | PRN
  Administered 2019-01-12: 300 mg via ORAL

## 2019-01-12 MED ORDER — MIRTAZAPINE 30 MG PO TABS
30.0000 mg | ORAL_TABLET | Freq: Every day | ORAL | Status: DC
  Filled 2019-01-12: qty 1

## 2019-01-12 MED ORDER — HEPARIN SODIUM (PORCINE) 1000 UNIT/ML IJ SOLN
INTRAMUSCULAR | Status: DC | PRN
  Administered 2019-01-12: 5000 [IU] via INTRAVENOUS
  Administered 2019-01-12: 7000 [IU] via INTRAVENOUS

## 2019-01-12 MED ORDER — INSULIN ASPART 100 UNIT/ML ~~LOC~~ SOLN
0.0000 [IU] | Freq: Three times a day (TID) | SUBCUTANEOUS | Status: DC
  Administered 2019-01-13: 2 [IU] via SUBCUTANEOUS
  Filled 2019-01-12: qty 1

## 2019-01-12 MED ORDER — FENTANYL CITRATE (PF) 100 MCG/2ML IJ SOLN
INTRAMUSCULAR | Status: DC | PRN
  Administered 2019-01-12 (×2): 25 ug via INTRAVENOUS

## 2019-01-12 MED ORDER — SODIUM CHLORIDE 0.9% FLUSH: 3.0000 mL | Freq: Two times a day (BID) | INTRAVENOUS | Status: DC

## 2019-01-12 MED ORDER — CANAGLIFLOZIN 100 MG PO TABS
100.0000 mg | ORAL_TABLET | Freq: Every day | ORAL | Status: DC
  Filled 2019-01-12: qty 1

## 2019-01-12 MED ORDER — NITROGLYCERIN 5 MG/ML IV SOLN
INTRAVENOUS | Status: AC
  Filled 2019-01-12: qty 10

## 2019-01-12 MED ORDER — SODIUM CHLORIDE 0.9 % WEIGHT BASED INFUSION: 1.0000 mL/kg/h | INTRAVENOUS | Status: DC

## 2019-01-12 MED ORDER — ASPIRIN 81 MG PO CHEW
CHEWABLE_TABLET | ORAL | Status: AC
  Administered 2019-01-12: 08:00:00
  Filled 2019-01-12: qty 1

## 2019-01-12 MED ORDER — MUPIROCIN 2 % EX OINT
1.0000 "application " | TOPICAL_OINTMENT | Freq: Two times a day (BID) | CUTANEOUS | Status: DC
  Administered 2019-01-12 – 2019-01-13 (×2): 1 via NASAL
  Filled 2019-01-12: qty 22

## 2019-01-12 MED ORDER — ENOXAPARIN SODIUM 40 MG/0.4ML ~~LOC~~ SOLN
40.0000 mg | SUBCUTANEOUS | Status: DC
  Administered 2019-01-13: 40 mg via SUBCUTANEOUS
  Filled 2019-01-12: qty 0.4

## 2019-01-12 MED ORDER — ACETAMINOPHEN 325 MG PO TABS
ORAL_TABLET | ORAL | Status: AC
  Filled 2019-01-12: qty 2

## 2019-01-12 MED ORDER — HEPARIN (PORCINE) IN NACL 1000-0.9 UT/500ML-% IV SOLN
INTRAVENOUS | Status: DC | PRN
  Administered 2019-01-12: 500 mL

## 2019-01-12 MED ORDER — ASPIRIN EC 81 MG PO TBEC
81.0000 mg | DELAYED_RELEASE_TABLET | Freq: Every day | ORAL | Status: DC
  Administered 2019-01-13: 81 mg via ORAL
  Filled 2019-01-12: qty 1

## 2019-01-12 MED ORDER — MIDAZOLAM HCL 2 MG/2ML IJ SOLN
INTRAMUSCULAR | Status: DC | PRN
  Administered 2019-01-12: 1 mg via INTRAVENOUS

## 2019-01-12 MED ORDER — INSULIN GLARGINE 100 UNIT/ML ~~LOC~~ SOLN
60.0000 [IU] | Freq: Every day | SUBCUTANEOUS | Status: DC
  Administered 2019-01-12: 60 [IU] via SUBCUTANEOUS
  Filled 2019-01-12 (×2): qty 0.6

## 2019-01-12 MED ORDER — VERAPAMIL HCL 2.5 MG/ML IV SOLN
INTRAVENOUS | Status: DC | PRN
  Administered 2019-01-12: 2.5 mg via INTRA_ARTERIAL

## 2019-01-12 MED ORDER — NITROGLYCERIN 1 MG/10 ML FOR IR/CATH LAB
INTRA_ARTERIAL | Status: DC | PRN
  Administered 2019-01-12 (×2): 200 ug via INTRACORONARY

## 2019-01-12 SURGICAL SUPPLY — 14 items
BALLN MINITREK RX 2.0X20 (BALLOONS) ×3
BALLN ~~LOC~~ EUPHORA RX 2.75X20 (BALLOONS) ×3
BALLOON MINITREK RX 2.0X20 (BALLOONS) ×1 IMPLANT
BALLOON ~~LOC~~ EUPHORA RX 2.75X20 (BALLOONS) ×1 IMPLANT
CATH INFINITI 5 FR JL3.5 (CATHETERS) ×3 IMPLANT
CATH LAUNCHER 6FR AL.75 (CATHETERS) ×3 IMPLANT
DEVICE INFLAT 30 PLUS (MISCELLANEOUS) ×3 IMPLANT
DEVICE RAD TR BAND REGULAR (VASCULAR PRODUCTS) ×3 IMPLANT
GLIDESHEATH SLEND SS 6F .021 (SHEATH) ×3 IMPLANT
KIT MANI 3VAL PERCEP (MISCELLANEOUS) ×3 IMPLANT
PACK CARDIAC CATH (CUSTOM PROCEDURE TRAY) ×3 IMPLANT
STENT SYNERGY DES 2.5X32 (Permanent Stent) ×3 IMPLANT
WIRE ROSEN-J .035X260CM (WIRE) ×3 IMPLANT
WIRE RUNTHROUGH .014X180CM (WIRE) ×3 IMPLANT

## 2019-01-12 NOTE — Plan of Care (Signed)
Patient transferred to unit from [facility] via stretcher. Oriented to room, call bell, and staff. Bed in lowest position. Fall safety plan reviewed. Full assessment to Epic. Skin assessment verified with Amy D, RN Telemetry box verification with tele clerk- Box#:[id number]. Pt post PCI to mid RCA, from Radial approach, , site is clean, dry and intact. No hematoma, no bleeding.VSS. Will continue to monitor.    Problem: Activity: Goal: Ability to return to baseline activity level will improve Outcome: Progressing   Problem: Cardiovascular: Goal: Ability to achieve and maintain adequate cardiovascular perfusion will improve Outcome: Progressing

## 2019-01-12 NOTE — Brief Op Note (Signed)
BRIEF CARDIAC CATHETERIZATION NOTE  01/12/2019  10:06 AM  PATIENT:  [name] [name]  38 y.o. female  PRE-OPERATIVE DIAGNOSIS:  Stable angina refractory to medical therapy  POST-OPERATIVE DIAGNOSIS:  Same  PROCEDURE:  Procedure(s): CORONARY STENT INTERVENTION (N/A) LEFT HEART CATH AND CORONARY ANGIOGRAPHY (N/A)  SURGEON:  Surgeon(s) and Role:    * Concha Sudol, MD - Primary  FINDINGS: 1. Stable appearance of coronary arteries since 12/04/2018, with 80-90% mid RCA disease and 20-30% in-stent restenosis in mid LCx. 2. Moderately to severely elevated left ventricular filling pressure. 3. Successful PCI to mid RCA using Synergy 2.5 x 32 mm DES with 0% residual stenosis and TIMI-3 flow.  RECOMMENDATIONS: 1. Overnight extended recovery. 2. Continue DAPT with ASA and clopidogrel (through at least 12/05/2019 but ideally longer). 3. Aggressive secondary prevention; plans already made to initiate PCSK9 inhibitor as an outpatient.  [name], MD [facility] Pager: [phone]

## 2019-01-12 NOTE — Interval H&P Note (Signed)
History and Physical Interval Note:  01/12/2019 9:03 AM  [name] [name]  has presented today for cardiac catheterization, with the diagnosis of CAD involving the native coronary arteries with unstable angina and plans for staged percutaneous coronary intervention of the right coronary artery  The various methods of treatment have been discussed with the patient and family. After consideration of risks, benefits and other options for treatment, the patient has consented to  Procedure(s): CORONARY STENT INTERVENTION (N/A) as a surgical intervention .  The patient's history has been reviewed, patient examined, no change in status, stable for surgery.  I have reviewed the patient's chart and labs.  Questions were answered to the patient's satisfaction.    Cath Lab Visit (complete for each Cath Lab visit)  Clinical Evaluation Leading to the Procedure:   ACS: No.  Non-ACS:    Anginal Classification: CCS III  Anti-ischemic medical therapy: Maximal Therapy (2 or more classes of medications)  Non-Invasive Test Results: No non-invasive testing performed  Prior CABG: No previous CABG  [name] [name]

## 2019-01-13 ENCOUNTER — Telehealth: Payer: Self-pay | Admitting: *Deleted

## 2019-01-13 ENCOUNTER — Encounter: Payer: Self-pay | Admitting: Internal Medicine

## 2019-01-13 ENCOUNTER — Telehealth: Payer: Self-pay | Admitting: Cardiovascular Disease

## 2019-01-13 DIAGNOSIS — I130 Hypertensive heart and chronic kidney disease with heart failure and stage 1 through stage 4 chronic kidney disease, or unspecified chronic kidney disease: Secondary | ICD-10-CM | POA: Diagnosis not present

## 2019-01-13 DIAGNOSIS — N183 Chronic kidney disease, stage 3 unspecified: Secondary | ICD-10-CM

## 2019-01-13 DIAGNOSIS — E1143 Type 2 diabetes mellitus with diabetic autonomic (poly)neuropathy: Secondary | ICD-10-CM | POA: Diagnosis not present

## 2019-01-13 DIAGNOSIS — I5032 Chronic diastolic (congestive) heart failure: Secondary | ICD-10-CM | POA: Diagnosis not present

## 2019-01-13 DIAGNOSIS — E1122 Type 2 diabetes mellitus with diabetic chronic kidney disease: Secondary | ICD-10-CM | POA: Diagnosis not present

## 2019-01-13 DIAGNOSIS — I25118 Atherosclerotic heart disease of native coronary artery with other forms of angina pectoris: Secondary | ICD-10-CM | POA: Diagnosis not present

## 2019-01-13 LAB — GLUCOSE, CAPILLARY: Glucose-Capillary: 129 mg/dL — ABNORMAL HIGH (ref 70–99)

## 2019-01-13 LAB — BASIC METABOLIC PANEL
Anion gap: 6 (ref 5–15)
BUN: 28 mg/dL — ABNORMAL HIGH (ref 6–20)
CO2: 21 mmol/L — ABNORMAL LOW (ref 22–32)
Calcium: 8.3 mg/dL — ABNORMAL LOW (ref 8.9–10.3)
Chloride: 112 mmol/L — ABNORMAL HIGH (ref 98–111)
Creatinine, Ser: 1.43 mg/dL — ABNORMAL HIGH (ref 0.44–1.00)
GFR calc Af Amer: 54 mL/min — ABNORMAL LOW (ref >60)
GFR calc non Af Amer: 46 mL/min — ABNORMAL LOW (ref >60)
Glucose, Bld: 161 mg/dL — ABNORMAL HIGH (ref 70–99)
Potassium: 4 mmol/L (ref 3.5–5.1)
Sodium: 139 mmol/L (ref 135–145)

## 2019-01-13 LAB — CBC
HCT: 32.3 % — ABNORMAL LOW (ref 36.0–46.0)
Hemoglobin: 9.7 g/dL — ABNORMAL LOW (ref 12.0–15.0)
MCH: 24.3 pg — ABNORMAL LOW (ref 26.0–34.0)
MCHC: 30 g/dL (ref 30.0–36.0)
MCV: 80.8 fL (ref 80.0–100.0)
Platelets: 413 10*3/uL — ABNORMAL HIGH (ref 150–400)
RBC: 4 MIL/uL (ref 3.87–5.11)
RDW: 16.9 % — ABNORMAL HIGH (ref 11.5–15.5)
WBC: 7.1 10*3/uL (ref 4.0–10.5)
nRBC: 0 % (ref 0.0–0.2)

## 2019-01-13 LAB — POCT ACTIVATED CLOTTING TIME: Activated Clotting Time: 285 seconds

## 2019-01-13 MED ORDER — FUROSEMIDE 40 MG PO TABS: 40.0000 mg | ORAL_TABLET | Freq: Every day | ORAL | 5 refills | Status: DC

## 2019-01-13 NOTE — Discharge Summary (Signed)
Discharge Summary    Patient ID: [name] [name] MRN: [id number]; DOB: 1981-04-01  Admit date: 01/12/2019 Discharge date: 01/13/2019  Primary Care Provider: Venita Lick, NP  Primary Cardiologist: Kathlyn Sacramento, MD  Primary Electrophysiologist:  None   Discharge Diagnoses    Principal Problem:   Coronary artery disease of native artery of native heart with stable angina pectoris [facility]) Active Problems:   Stable angina (HCC)   Allergies Allergies  Allergen Reactions  . Lisinopril Anaphylaxis, Itching and Swelling  . Rosemary Oil Anaphylaxis  . Shellfish Allergy Itching and Swelling  . Other Itching and Swelling    [place] seasoning  . Metformin And Related Diarrhea, Nausea And Vomiting and Rash    Diagnostic Studies/Procedures    LHC/PCI (01/12/2019): 1. Stable appearance of coronary arteries since 12/04/2018, with 80-90% mid RCA disease and 20-30% in-stent restenosis in mid LCx. 2. Moderately to severely elevated left ventricular filling pressure. 3. Successful PCI to mid RCA using Synergy 2.5 x 32 mm drug-eluting stent (postdilated to 2.8 mm) with 0% residual stenosis and TIMI-3 flow.  Diagnostic  Dominance: Right    Intervention      _____________   History of Present Illness     [name] [name] is a 38 y.o. female with history of CAD s/p prior LCx and OM stenting, chronic combined CHF/ICM with subsequent normalization of the LVSF, DM2 diagnosed in 2000, CKD stage II-III, anemia, diabetic gastroparesis, CKD stage 3, HTN, and HLD who presents for cardiac catheterization and possible PCI due to recurrent angina with minimal activity despite aggressive medical therapy.  She was admitted to [facility] in 11/2018 and was found to have severe ISR of LCx stent as well as severe proximal/mid RCA disease.  She underwent successful PTCA to ISR of LCx; PCI to RCA was deferred due to acute on chronic renal insufficiency.  She noted mild improvement in chest pain but continued to have  chest discomfort with minimal activity despite escalation of antianginal therapy.  Hospital Course     Consultants: None   Patient presented for elective LHC.  This revealed stable appearance of the coronary arteries.  Mild residual ISR noted in LCx.  Sequential lesions involving the proximal/mid RCA (up to 80-90%) were unchanged.  LVEDP was moderately to severely elevated (~35 mmHg).  The patient underwent successful PCI to the proximal/mid RCA using a Synergy 2.5 x 38 mm DES with 0% residual stenosis and TIMI-3 flow.  This morning, [name] [name] feels well, noting that her shortness of breath has improved.  She denies chest pain or issues with her right radial cath site.  She notes mild LE edema.  Exam is notable for RRR w/o murmurs.  Lungs are clear.  There is no JVD.  Trace pedal edema is noted.  Right radial cath site is clean without hematoma.  2+ right radial pulse.  Labs are notable for stable creatinine and hemoglobin.  Patient is stable for discharge home today.  Due to elevated LVEDP and pedal edema, I will increase furosemide to 40 mg PO daily.  She will continue ASA and clopidogrel.  PCSK9 inhibitor initiation will need to be followed up on as an outpatient.  We will have her return for a BMP early next week and see Dr. Fletcher Anon or an APP in ~2 weeks. _____________  Discharge Vitals Blood pressure (!) 154/85, pulse 81, temperature 98.6 F (37 C), temperature source Oral, resp. rate 16, height 5\' 5"  (1.651 m), weight 127 kg, SpO2 100 %.  Filed Weights  01/12/19 0752  Weight: 127 kg    Labs & Radiologic Studies    CBC Recent Labs    01/12/19 1557 01/13/19 0526  WBC 7.7 7.1  HGB 9.7* 9.7*  HCT 31.2* 32.3*  MCV 79.4* 80.8  PLT 417* 185*   Basic Metabolic Panel Recent Labs    01/12/19 1557 01/13/19 0526  NA  --  139  K  --  4.0  CL  --  112*  CO2  --  21*  GLUCOSE  --  161*  BUN  --  28*  CREATININE 1.32* 1.43*  CALCIUM  --  8.3*   Liver Function Tests No results  for input(s): AST, ALT, ALKPHOS, BILITOT, PROT, ALBUMIN in the last 72 hours. No results for input(s): LIPASE, AMYLASE in the last 72 hours. Cardiac Enzymes No results for input(s): CKTOTAL, CKMB, CKMBINDEX, TROPONINI in the last 72 hours. BNP Invalid input(s): POCBNP D-Dimer No results for input(s): DDIMER in the last 72 hours. Hemoglobin A1C No results for input(s): HGBA1C in the last 72 hours. Fasting Lipid Panel No results for input(s): CHOL, HDL, LDLCALC, TRIG, CHOLHDL, LDLDIRECT in the last 72 hours. Thyroid Function Tests No results for input(s): TSH, T4TOTAL, T3FREE, THYROIDAB in the last 72 hours.  Invalid input(s): FREET3 _____________  No results found. Disposition   Pt is being discharged home today in good condition.  Follow-up Plans & Appointments    BMP early next week and f/u with Dr. Fletcher Anon or APP in ~2 weeks.  Office will contact patient later today to arrange appointments.   Discharge Instructions    AMB Referral to Cardiac Rehabilitation - Phase II   Complete by:  As directed    Diagnosis:  Coronary Stents   Call MD for:   Complete by:  As directed    Chest pain   Call MD for:  difficulty breathing, headache or visual disturbances   Complete by:  As directed    Call MD for:  redness, tenderness, or signs of infection (pain, swelling, redness, odor or green/yellow discharge around incision site)   Complete by:  As directed    Call MD for:  severe uncontrolled pain   Complete by:  As directed    Call MD for:  temperature >100.4   Complete by:  As directed    Diet - low sodium heart healthy   Complete by:  As directed    Discharge wound care:   Complete by:  As directed    Keep your right wrist incision clean and dry until it has healed completely.  You may remove the clear dressing tomorrow.   Increase activity slowly   Complete by:  As directed    Lifting restrictions   Complete by:  As directed    Do not push, pull, or lift more than 5 pounds  using the right hand for 2 days.      Discharge Medications   Allergies as of 01/13/2019      Reactions   Lisinopril Anaphylaxis, Itching, Swelling   Rosemary Oil Anaphylaxis   Shellfish Allergy Itching, Swelling   Other Itching, Swelling   [place] seasoning   Metformin And Related Diarrhea, Nausea And Vomiting, Rash      Medication List    TAKE these medications   aspirin EC 81 MG tablet Take 81 mg by mouth daily.   benzonatate 100 MG capsule Commonly known as:  TESSALON Take 1 capsule (100 mg total) by mouth 2 (two) times daily as needed  for cough.   Cholecalciferol 50 MCG (2000 UT) Tabs Take 2,000 Units by mouth daily.   clopidogrel 75 MG tablet Commonly known as:  PLAVIX Take 4 tabs at 7pm tonight (12/28), then 1 tab daily beginning 12/29. What changed:    how much to take  how to take this  when to take this  additional instructions   DULoxetine 30 MG capsule Commonly known as:  CYMBALTA Take 60 mg by mouth daily.   FARXIGA 10 MG Tabs tablet Generic drug:  dapagliflozin propanediol Take 10 mg by mouth daily.   ferrous sulfate 325 (65 FE) MG tablet Take 1 tablet (325 mg total) by mouth daily.   furosemide 40 MG tablet Commonly known as:  LASIX Take 1 tablet (40 mg total) by mouth daily. What changed:    medication strength  how much to take  when to take this  reasons to take this   glimepiride 4 MG tablet Commonly known as:  AMARYL TAKE 1 TABLET BY MOUTH ONCE DAILY   ACCU-CHEK AVIVA test strip Generic drug:  glucose blood 1 each by Other route as needed for other. Use as instructed   glucose blood test strip Commonly known as:  ACCU-CHEK ACTIVE STRIPS Use as instructed   insulin aspart 100 UNIT/ML injection Commonly known as:  novoLOG Inject 10 Units into the skin 3 (three) times daily with meals. What changed:  how much to take   insulin glargine 100 UNIT/ML injection Commonly known as:  LANTUS Inject 0.4 mLs (40 Units total)  into the skin at bedtime. What changed:  how much to take   Insulin Pen Needle 32G X 4 MM Misc 1 Units by Does not apply route every morning. Pen needles   isosorbide mononitrate 30 MG 24 hr tablet Commonly known as:  IMDUR Take 2 tablets (60 mg total) by mouth daily.   loperamide 2 MG tablet Commonly known as:  IMODIUM A-D Take 8-12 mg by mouth daily as needed for diarrhea or loose stools.   meclizine 25 MG tablet Commonly known as:  ANTIVERT Take 25 mg by mouth 3 (three) times daily as needed for dizziness.   metoprolol tartrate 50 MG tablet Commonly known as:  LOPRESSOR Take 1 tablet (50 mg total) by mouth 2 (two) times daily.   mirtazapine 30 MG tablet Commonly known as:  REMERON Take 30 mg by mouth daily.   NEXPLANON 68 MG Impl implant Generic drug:  etonogestrel 1 each by Subdermal route once.   nitroGLYCERIN 0.4 MG SL tablet Commonly known as:  NITROSTAT Take 1 tab under your tongue while sitting for chest pain, If no relief may repeat, one tab every 5 min up to 3 tabs total over 15 mins.   pantoprazole 40 MG tablet Commonly known as:  PROTONIX TAKE 1 TABLET BY MOUTH ONCE DAILY AT 6AM What changed:    how much to take  how to take this  when to take this  additional instructions            Discharge Care Instructions  (From admission, onward)         Start     Ordered   01/13/19 0000  Discharge wound care:    Comments:  Keep your right wrist incision clean and dry until it has healed completely.  You may remove the clear dressing tomorrow.   01/13/19 0815           Acute coronary syndrome (MI, NSTEMI, STEMI, etc) this admission?: No.  Outstanding Labs/Studies   None  Duration of Discharge Encounter   Less than 30 minutes including physician time.  Signed, [name], MD 01/13/2019, 8:26 AM

## 2019-01-13 NOTE — Progress Notes (Signed)
Pt discharged home with husband medication education and prescription information provided along with radial site care. Patient with f/u appt with cardiology and cardiac rehab have been set as well. IVs removed, telemonitor returned, VSS, no complaints at this time.

## 2019-01-13 NOTE — Telephone Encounter (Signed)
Patient contacted regarding discharge from [facility] on 01/13/19.   Patient understands to follow up with provider ? On 01/28/19 at [facility] [place].  Patient understands discharge instructions? Yes Patient understands medications and regiment? Yes Patient understands to bring all medications to this visit? Yes

## 2019-01-13 NOTE — Progress Notes (Signed)
Cardiovascular and Pulmonary Nurse Navigator Note:    [name] [name] a 38 y.o.femalewith history of CAD s/p prior LCx and OM stenting, chronic combined CHF/ICM with subsequent normalization of the LVSF, DM2 diagnosed in 2000, CKD stage II-III, anemia, diabetic gastroparesis, CKD stage 3, HTN, HLD, former smoker  whopresented forcardiac catheterization and possible PCI due to recurrent angina with minimal activity despite aggressive medical therapy.  She was admitted to [facility] in 11/2018 and was found to have severe ISR of LCx stent as well as severe proximal/mid RCA disease.  She underwent successful PTCA to ISR of LCx; PCI to RCA was deferred due to acute on chronic renal insufficiency. Patient presented to [facility] for staged PCI to RCA on 01/12/2019.      Date:  01/12/2019:   Panel Physicians Referring Physician Case Authorizing Physician  End, Harrell Gave, MD (Primary)  Rise Mu, PA-C  Procedures   CORONARY STENT INTERVENTION  LEFT HEART CATH AND CORONARY ANGIOGRAPHY  Conclusion   Conclusions: 1. Stable appearance of coronary arteries since 12/04/2018, with 80-90% mid RCA disease and 20-30% in-stent restenosis in mid LCx. 2. Moderately to severely elevated left ventricular filling pressure. 3. Successful PCI to mid RCA using Synergy 2.5 x 32 mm drug-eluting stent (postdilated to 2.8 mm) with 0% residual stenosis and TIMI-3 flow.  Recommendations: 1. Overnight extended recovery. 2. Continue dual antiplatelet therapy with aspirin and clopidogrel (through at least 12/05/2019 but ideally longer). 3. Aggressive secondary prevention; plans already made to initiate PCSK9 inhibitor as an outpatient. 4. Defer post catheterization hydration in the setting of moderately to severely elevated LVEDP.  If renal function tolerates, consider initiation of gentle diuresis in the morning.  [name], MD [place] Pager: [phone]   Cardiac Rehab:   Exercise - Benefits of  exercised discussed. Patient participating in Cardiac Rehab was deferred in December 2019  until patient could have staged PCI to RCA.  Explained to patient she has been referred to Cardiac Rehab.  Overview of the program provided.  Patient reports she is now ready to participate in Cardiac Rehab. Patient is scheduled for Cardiac Rehab orientation on 01/21/2019 at 11:30 a.m.  Red orientation packet given to patient.  Explained to patient what to expect during orientation.    [facility] about support group provided. Brochure given. Patient invited and encouraged to attend. Next meeting 01/19/2019 at 11:30 a.m.  ?  Patient appreciative of the above information.  ? [name], RN, BSN, [place]  [facility] Cardiac & Pulmonary Rehab  Cardiovascular & Pulmonary Nurse Navigator  Direct Line: [phone]  Department Phone #: [phone] Fax: [phone]  Email Address: [email or web address]

## 2019-01-13 NOTE — Telephone Encounter (Signed)
-----   Message from [name], MD sent at 01/13/2019  8:50 AM EST ----- Regarding: Post-PCI follow-up Good morning,  [name] [name] underwent outpatient PCI yesterday.  Could you set her up for a BMP on Monday, given her CKD and recent cath, as well as f/u with Dr. Fletcher Anon (her primary cardiologist) or an APP in ~2 weeks.  Let me know if any questions come up.  Thanks.  Gerald Stabs

## 2019-01-13 NOTE — Telephone Encounter (Signed)
TCM....  Patient is being discharged      They are scheduled to see Thurmond Butts on 02/20 at  58 and [facility]  They were seen for ckd s/p cath   They need to be seen within 2 weeks

## 2019-01-13 NOTE — Discharge Instructions (Signed)
Angina  Angina is a very bad discomfort or pain in the chest, neck, or arm. Angina may cause the following symptoms in your chest:  Crushing or squeezing pain. Pain might last for more than a few minutes at a time. Or, it may stop and come back (recur) over a few minutes.  Tightness.  Fullness.  Pressure.  Heaviness.  Some people also have:  Pain in the arms, neck, jaw, or back.  Heartburn or indigestion for no reason.  Shortness of breath.  An upset stomach (nausea).  Sudden cold sweats.  Women and people with diabetes may have other less common symptoms such as:  Feeling tired (fatigue).  Feeling nervous or worried for no reason.  Feeling weak for no reason.  Dizziness or fainting.  Follow these instructions at home:  Medicines  Take over-the-counter and prescription medicines only as told by your doctor.  Do not take these medicines unless your doctor says that you can:  NSAIDs. These include:  Ibuprofen.  Naproxen.  Celecoxib.  Vitamin supplements that have vitamin A, vitamin E, or both.  Hormone therapy that contains estrogen with or without progestin.  Eating and drinking    Eat a heart-healthy diet that includes:  Plenty of fresh fruits and vegetables.  Whole grains.  Lowfat (lean) protein.  Lowfat dairy products.  Work with a diet and nutrition specialist (dietitian) as told by your doctor. This person can help you make healthy food choices.  Follow other instructions from your doctor about eating or drinking restrictions.  Activity  Follow an exercise program that your doctor tells you.  Return to your normal activities as told by your doctor. Ask your doctor what activities are safe for you.  When you feel tired, take a break. Plan breaks if you know you are going to feel tired.  Lifestyle    Do not use any products that contain nicotine or tobacco. This includes cigarettes and e-cigarettes. If you need help quitting, ask your doctor.  If your doctor says you can drink alcohol, limit your drinking to:  0-1  drink a day for women.  0-2 drinks a day for men.  Be aware of how much alcohol is in your drink. In the [place] 1 drink equals to:  12 oz of beer.  5 oz of wine.  1 oz of hard liquor.  General instructions  Stay at a healthy weight. If your doctor tells you to do so, work with him or her to lose weight.  Learn to deal with stress. If you need help, ask your doctor.  Take a depression screening test to see if you are at risk for depression. If you feel depressed, talk with your doctor.  Work with your doctor to manage any other health conditions that you have. These may include diabetes or high blood pressure.  Keep your vaccines up to date. Get a flu shot every year.  Keep all follow-up visits as told by your doctor. This is important.  Get help right away if:  You have pain in your chest, neck, arm, jaw, stomach, or back, and the pain:  Lasts more than a few minutes.  Comes back.  Is very painful.  Comes more often.  Does not get better after you take medicine under your tongue (sublingual nitroglycerin).  You have any of these problems for no reason:  Heartburn, or indigestion.  Sweating a lot.  Shortness of breath.  Trouble breathing.  Feeling sick to your stomach.  Throwing up.    Feeling more tired than usual.  Feeling nervous or worrying more than usual.  Weakness.  Watery poop (diarrhea).  You are suddenly dizzy or light-headed.  You pass out (faint).  These symptoms may be an emergency. Do not wait to see if the symptoms will go away. Get medical help right away. Call your local emergency services (911 in the [place] Do not drive yourself to the hospital.  Summary  Angina is very bad discomfort or pain in the chest, neck, or arm.  Angina may feel like a crushing or squeezing pain in the chest. It may feel like tightness, pressure, fullness, or heaviness in the chest.  Women or people with diabetes may have different symptoms from men, such as feeling nervous, being worried, being weak for no reason, or feeling  tired.  Take medicines only as told by your doctor.  You should eat a heart-healthy diet and follow an exercise program.  This information is not intended to replace advice given to you by your health care provider. Make sure you discuss any questions you have with your health care provider.  Document Released: 05/13/2008 Document Revised: 01/09/2018 Document Reviewed: 01/09/2018  Elsevier Interactive Patient Education  2019 [employer]vier Inc[employer]

## 2019-01-13 NOTE — Plan of Care (Signed)
  Problem: Education: Goal: Understanding of CV disease, CV risk reduction, and recovery process will improve Outcome: Progressing   Problem: Activity: Goal: Ability to return to baseline activity level will improve Outcome: Progressing   Problem: Cardiovascular: Goal: Ability to achieve and maintain adequate cardiovascular perfusion will improve Outcome: Adequate for Discharge Goal: Vascular access site(s) Level 0-1 will be maintained Outcome: Adequate for Discharge

## 2019-01-13 NOTE — Telephone Encounter (Signed)
Patient notified and verbalized understanding [facility]10 for lab work. She is also scheduled to see Ryan on 01/28/19. Lab order entered.

## 2019-01-13 NOTE — Telephone Encounter (Signed)
-----   Message from [name], MD sent at 01/13/2019  8:50 AM EST ----- Regarding: Post-PCI follow-up Good morning,  Ms. Dante Gang underwent outpatient PCI yesterday.  Could you set her up for a BMP on Monday, given her CKD and recent cath, as well as f/u with Dr. Fletcher Anon (her primary cardiologist) or an APP in ~2 weeks.  Let me know if any questions come up.  Thanks.  Gerald Stabs

## 2019-01-14 ENCOUNTER — Inpatient Hospital Stay: Payer: Medicare Other | Attending: Oncology | Admitting: Oncology

## 2019-01-14 ENCOUNTER — Encounter: Payer: Self-pay | Admitting: Oncology

## 2019-01-14 ENCOUNTER — Inpatient Hospital Stay: Payer: Medicare Other

## 2019-01-14 ENCOUNTER — Other Ambulatory Visit: Payer: Self-pay

## 2019-01-14 VITALS — BP 180/93 | HR 102 | Temp 97.7°F | Wt 284.0 lb

## 2019-01-14 DIAGNOSIS — D649 Anemia, unspecified: Secondary | ICD-10-CM

## 2019-01-14 DIAGNOSIS — D509 Iron deficiency anemia, unspecified: Secondary | ICD-10-CM | POA: Diagnosis not present

## 2019-01-14 LAB — IRON AND TIBC
Iron: 28 ug/dL (ref 28–170)
Saturation Ratios: 9 % — ABNORMAL LOW (ref 10.4–31.8)
TIBC: 319 ug/dL (ref 250–450)
UIBC: 291 ug/dL

## 2019-01-14 LAB — CBC
HCT: 30.4 % — ABNORMAL LOW (ref 36.0–46.0)
Hemoglobin: 9.3 g/dL — ABNORMAL LOW (ref 12.0–15.0)
MCH: 24.4 pg — ABNORMAL LOW (ref 26.0–34.0)
MCHC: 30.6 g/dL (ref 30.0–36.0)
MCV: 79.8 fL — ABNORMAL LOW (ref 80.0–100.0)
Platelets: 409 10*3/uL — ABNORMAL HIGH (ref 150–400)
RBC: 3.81 MIL/uL — ABNORMAL LOW (ref 3.87–5.11)
RDW: 16.8 % — ABNORMAL HIGH (ref 11.5–15.5)
WBC: 6.7 10*3/uL (ref 4.0–10.5)
nRBC: 0 % (ref 0.0–0.2)

## 2019-01-14 LAB — VITAMIN B12: Vitamin B-12: 289 pg/mL (ref 180–914)

## 2019-01-14 LAB — RETICULOCYTES
Immature Retic Fract: 16.4 % — ABNORMAL HIGH (ref 2.3–15.9)
RBC.: 3.81 MIL/uL — ABNORMAL LOW (ref 3.87–5.11)
Retic Count, Absolute: 59.8 10*3/uL (ref 19.0–186.0)
Retic Ct Pct: 1.6 % (ref 0.4–3.1)

## 2019-01-14 LAB — FOLATE: Folate: 12 ng/mL (ref >5.9)

## 2019-01-14 LAB — DAT, POLYSPECIFIC AHG (ARMC ONLY): Polyspecific AHG test: NEGATIVE

## 2019-01-14 LAB — FERRITIN: Ferritin: 32 ng/mL (ref 11–307)

## 2019-01-14 LAB — LACTATE DEHYDROGENASE: LDH: 158 U/L (ref 98–192)

## 2019-01-14 NOTE — Progress Notes (Signed)
Patient here today for initial evaluation regarding thrombocytosis. 

## 2019-01-15 ENCOUNTER — Other Ambulatory Visit: Payer: Self-pay | Admitting: Nurse Practitioner

## 2019-01-15 LAB — HAPTOGLOBIN: Haptoglobin: 323 mg/dL — ABNORMAL HIGH (ref 33–278)

## 2019-01-15 LAB — ERYTHROPOIETIN: Erythropoietin: 23.5 m[IU]/mL — ABNORMAL HIGH (ref 2.6–18.5)

## 2019-01-15 MED ORDER — BENZONATATE 100 MG PO CAPS: 100.0000 mg | ORAL_CAPSULE | Freq: Two times a day (BID) | ORAL | 12 refills | Status: DC | PRN

## 2019-01-15 NOTE — Progress Notes (Signed)
[place]  [phone] [phone]  ID: [name] OB: [birth year]  MR#: [id number]  HLK#:[id number]  Patient Care Team: Venita Lick, NP as PCP - General (Nurse Practitioner) Wellington Hampshire, MD as PCP - Cardiology (Cardiology) Wellington Hampshire, MD as Consulting Physician (Cardiology) Alisa Graff, FNP as Nurse Practitioner (Family Medicine)  CHIEF COMPLAINT: Thrombocytosis, iron deficiency anemia.  INTERVAL HISTORY: Patient is a 38 year old female who was noted to have an elevated platelet count as well as iron deficiency anemia on routine blood work.  She currently feels well and is asymptomatic.  She has no neurologic complaints.  She denies any recent fevers or illnesses.  She has a good appetite and denies weight loss.  She has no chest pain or shortness of breath.  She denies any nausea, vomiting, constipation, or diarrhea.  She denies any melena or hematochezia.  She has no urinary complaints.  Patient feels at her baseline offers no specific complaints today.  REVIEW OF SYSTEMS:   Review of Systems  Constitutional: Negative.  Negative for fever, malaise/fatigue and weight loss.  Respiratory: Negative.  Negative for cough, hemoptysis and shortness of breath.   Cardiovascular: Negative.  Negative for chest pain and leg swelling.  Gastrointestinal: Negative.  Negative for abdominal pain, blood in stool and melena.  Genitourinary: Negative.  Negative for hematuria.  Musculoskeletal: Negative.  Negative for back pain.  Skin: Negative.  Negative for rash.  Neurological: Negative.  Negative for focal weakness, weakness and headaches.  Psychiatric/Behavioral: Negative.  The patient is not nervous/anxious.     As per HPI. Otherwise, a complete review of systems is negative.  PAST MEDICAL HISTORY: Past Medical History:  Diagnosis Date  . CAD (coronary artery disease)    a. 03/2015 NSTEMI/PCI: LCX 163m (DES), OM1 100p (DES - vessel  labeled Ramus in subsequent cath report). Minor irregs to LAD/RCA; b. 04/2015 Cath: LM nl, LAD 40p, RI patent stent, LCX patent stent, RCA nl, EF 55-65%; c. 02/2016 MV: basal inferolateral and mid inferolateral defect/infarct w/o ischemia. EF 55-65%; d. 11/2018 PCI: LM 30ost, LAD 30p, LCX 95p/m (PTCA->20%), OM1 10ost, OM2 40ost, RCA 85p.  Marland Kitchen Chronic diastolic (congestive) heart failure ([place])    a. 03/2015 Echo: EF 30-35%, mild concentric LVH, severe HK of inf, inflat, & lat walls, mod MR, mild TR;  b. 04/2015 LV Gram: EF 55-65%; c. 09/2017 Echo: EF 55-60%, no rwma. Mild MR; d. 11/2018 Echo: Ef 60-65%, no rwma.  . CKD (chronic kidney disease), stage III ([place])   . Diabetes type 2, controlled ([place])    a. since 2002   . Diabetic gastroparesis ([facility])    a. chronic nausea/vomiting.  . Diverticulosis, sigmoid 07/2016  . Heavy menses    a. H/O IUD - expired in 2014 - remains in place.  . Hyperlipidemia   . Hypertension   . Iron deficiency anemia   . Ischemic cardiomyopathy (resolved)    a. 03/2015 EF 30-35% post NSTEMI;  b. 04/2015 EF 55-65% on LV gram; c. 09/2017 Echo: EF 55-60%; d. 11/2018 Echo: Ef 60-65%.  . Obesity   . Tobacco abuse    a. quit 03/2015.  Marland Kitchen Uterine fibroid   . Vertigo   . Vitamin D deficiency     PAST SURGICAL HISTORY: Past Surgical History:  Procedure Laterality Date  . APPENDECTOMY    . CARDIAC CATHETERIZATION  4/16   x2 stent [facility]  . CARDIAC CATHETERIZATION N/A 05/05/2015   Procedure: Left Heart Cath and Coronary  Angiography;  Surgeon: Peter M Martinique, MD;  Location: [place] [facility];  Service: Cardiovascular;  Laterality: N/A;  . COLONOSCOPY WITH PROPOFOL N/A 07/31/2016   Procedure: COLONOSCOPY WITH PROPOFOL;  Surgeon: Lollie Sails, MD;  Location: [facility] ENDOSCOPY;  Service: Endoscopy;  Laterality: N/A;  . CORONARY BALLOON ANGIOPLASTY N/A 12/04/2018   Procedure: CORONARY BALLOON ANGIOPLASTY;  Surgeon: Wellington Hampshire, MD;  Location: [place] [facility] LAB;  Service:  Cardiovascular;  Laterality: N/A;  . CORONARY STENT INTERVENTION N/A 01/12/2019   Procedure: CORONARY STENT INTERVENTION;  Surgeon: [name], MD;  Location: [place] CV LAB;  Service: Cardiovascular;  Laterality: N/A;  . ESOPHAGOGASTRODUODENOSCOPY (EGD) WITH PROPOFOL N/A 07/31/2016   Procedure: ESOPHAGOGASTRODUODENOSCOPY (EGD) WITH PROPOFOL;  Surgeon: Lollie Sails, MD;  Location: [facility] ENDOSCOPY;  Service: Endoscopy;  Laterality: N/A;  . INTRAVASCULAR PRESSURE WIRE/FFR STUDY N/A 12/04/2018   Procedure: INTRAVASCULAR PRESSURE WIRE/FFR STUDY;  Surgeon: Wellington Hampshire, MD;  Location: [place] [facility] LAB;  Service: Cardiovascular;  Laterality: N/A;  . LAPAROSCOPIC APPENDECTOMY N/A 08/07/2016   Procedure: APPENDECTOMY LAPAROSCOPIC;  Surgeon: Hubbard Robinson, MD;  Location: [facility] ORS;  Service: General;  Laterality: N/A;  . LEFT HEART CATH AND CORONARY ANGIOGRAPHY Left 12/04/2018   Procedure: LEFT HEART CATH AND CORONARY ANGIOGRAPHY;  Surgeon: Wellington Hampshire, MD;  Location: [place] [facility] LAB;  Service: Cardiovascular;  Laterality: Left;  . LEFT HEART CATH AND CORONARY ANGIOGRAPHY N/A 01/12/2019   Procedure: LEFT HEART CATH AND CORONARY ANGIOGRAPHY;  Surgeon: [name], MD;  Location: [place] [facility] LAB;  Service: Cardiovascular;  Laterality: N/A;  . MOUTH SURGERY      FAMILY HISTORY: Family History  Problem Relation Age of Onset  . Diabetes Father   . Cancer Maternal Grandmother        lung  . Cancer Maternal Grandfather        prostate  . Diabetes Paternal Grandfather   . Diabetes Paternal Grandmother   . Cancer - Cervical Maternal Aunt     ADVANCED DIRECTIVES (Y/N):  N  HEALTH MAINTENANCE: Social History   Tobacco Use  . Smoking status: Former Smoker    Years: 15.00    Last attempt to quit: 03/10/2015    Years since quitting: 3.8  . Smokeless tobacco: Never Used  Substance Use Topics  . Alcohol use: No    Alcohol/week: 0.0 standard drinks  . Drug  use: No     Colonoscopy:  PAP:  Bone density:  Lipid panel:  Allergies  Allergen Reactions  . Lisinopril Anaphylaxis, Itching and Swelling  . Rosemary Oil Anaphylaxis  . Shellfish Allergy Itching and Swelling  . Other Itching and Swelling    [place] seasoning  . Metformin And Related Diarrhea, Nausea And Vomiting and Rash    Current Outpatient Medications  Medication Sig Dispense Refill  . aspirin EC 81 MG tablet Take 81 mg by mouth daily.    . benzonatate (TESSALON) 100 MG capsule Take 1 capsule (100 mg total) by mouth 2 (two) times daily as needed for cough. 30 capsule 12  . Cholecalciferol 2000 units TABS Take 2,000 Units by mouth daily.     . clopidogrel (PLAVIX) 75 MG tablet Take 4 tabs at 7pm tonight (12/28), then 1 tab daily beginning 12/29. (Patient taking differently: Take 75 mg by mouth daily. ) 90 tablet 3  . DULoxetine (CYMBALTA) 30 MG capsule Take 60 mg by mouth daily.   3  . etonogestrel (NEXPLANON) 68 MG IMPL implant 1  each by Subdermal route once.    Marland Kitchen FARXIGA 10 MG TABS tablet Take 10 mg by mouth daily.   11  . ferrous sulfate 325 (65 FE) MG tablet Take 1 tablet (325 mg total) by mouth daily.    . furosemide (LASIX) 40 MG tablet Take 1 tablet (40 mg total) by mouth daily. 30 tablet 5  . glimepiride (AMARYL) 4 MG tablet TAKE 1 TABLET BY MOUTH ONCE DAILY (Patient taking differently: Take 4 mg by mouth daily. ) 90 tablet 1  . glucose blood (ACCU-CHEK ACTIVE STRIPS) test strip Use as instructed 100 each 12  . glucose blood (ACCU-CHEK AVIVA) test strip 1 each by Other route as needed for other. Use as instructed    . insulin aspart (NOVOLOG) 100 UNIT/ML injection Inject 10 Units into the skin 3 (three) times daily with meals. (Patient taking differently: Inject 6-20 Units into the skin 3 (three) times daily with meals. ) 10 mL 11  . insulin glargine (LANTUS) 100 UNIT/ML injection Inject 0.4 mLs (40 Units total) into the skin at bedtime. (Patient taking differently: Inject  60 Units into the skin at bedtime. ) 10 mL 11  . Insulin Pen Needle 32G X 4 MM MISC 1 Units by Does not apply route every morning. Pen needles 90 each 3  . isosorbide mononitrate (IMDUR) 30 MG 24 hr tablet Take 2 tablets (60 mg total) by mouth daily. 90 tablet 3  . loperamide (IMODIUM A-D) 2 MG tablet Take 8-12 mg by mouth daily as needed for diarrhea or loose stools.    . meclizine (ANTIVERT) 25 MG tablet Take 25 mg by mouth 3 (three) times daily as needed for dizziness.    . metoprolol tartrate (LOPRESSOR) 50 MG tablet Take 1 tablet (50 mg total) by mouth 2 (two) times daily. 180 tablet 3  . mirtazapine (REMERON) 30 MG tablet Take 30 mg by mouth daily.   3  . nitroGLYCERIN (NITROSTAT) 0.4 MG SL tablet Take 1 tab under your tongue while sitting for chest pain, If no relief may repeat, one tab every 5 min up to 3 tabs total over 15 mins. 25 tablet 3  . pantoprazole (PROTONIX) 40 MG tablet TAKE 1 TABLET BY MOUTH ONCE DAILY AT 6AM (Patient taking differently: Take 40 mg by mouth 2 (two) times daily. ) 90 tablet 1   No current facility-administered medications for this visit.     OBJECTIVE: Vitals:   01/14/19 1114  BP: (!) 180/93  Pulse: (!) 102  Temp: 97.7 F (36.5 C)     Body mass index is 47.26 kg/m.    ECOG FS:0 - Asymptomatic  General: Well-developed, well-nourished, no acute distress. Eyes: Pink conjunctiva, anicteric sclera. HEENT: Normocephalic, moist mucous membranes, clear oropharnyx. Lungs: Clear to auscultation bilaterally. Heart: Regular rate and rhythm. No rubs, murmurs, or gallops. Abdomen: Soft, nontender, nondistended. No organomegaly noted, normoactive bowel sounds. Musculoskeletal: No edema, cyanosis, or clubbing. Neuro: Alert, answering all questions appropriately. Cranial nerves grossly intact. Skin: No rashes or petechiae noted. Psych: Normal affect. Lymphatics: No cervical, calvicular, axillary or inguinal LAD.   LAB RESULTS:  Lab Results  Component Value  Date   NA 139 01/13/2019   K 4.0 01/13/2019   CL 112 (H) 01/13/2019   CO2 21 (L) 01/13/2019   GLUCOSE 161 (H) 01/13/2019   BUN 28 (H) 01/13/2019   CREATININE 1.43 (H) 01/13/2019   CALCIUM 8.3 (L) 01/13/2019   PROT 6.6 12/17/2018   ALBUMIN 3.3 (L) 12/17/2018  AST 30 12/17/2018   ALT 35 (H) 12/17/2018   ALKPHOS 240 (H) 12/17/2018   BILITOT <0.2 12/17/2018   GFRNONAA 46 (L) 01/13/2019   GFRAA 54 (L) 01/13/2019    Lab Results  Component Value Date   WBC 6.7 01/14/2019   NEUTROABS 5.9 01/06/2019   HGB 9.3 (L) 01/14/2019   HCT 30.4 (L) 01/14/2019   MCV 79.8 (L) 01/14/2019   PLT 409 (H) 01/14/2019   Lab Results  Component Value Date   IRON 28 01/14/2019   TIBC 319 01/14/2019   IRONPCTSAT 9 (L) 01/14/2019   Lab Results  Component Value Date   FERRITIN 32 01/14/2019    STUDIES: No results found.  ASSESSMENT: Thrombocytosis, iron deficiency anemia.  PLAN:    1.  Iron deficiency anemia: Patient noted to have decreased iron stores and hemoglobin, despite taking oral iron supplementation.  All of her other laboratory work is either negative or within normal limits.  Patient will benefit from IV Feraheme.  She was instructed to continue oral supplementation as prescribed.  Return to clinic in 1 month with repeat laboratory work, further evaluation, and initiation of IV Feraheme. 2.  Thrombocytosis: Likely secondary to iron deficiency.  Feraheme as above.  I spent a total of 45 minutes face-to-face with the patient of which greater than 50% of the visit was spent in counseling and coordination of care as detailed above.   Patient expressed understanding and was in agreement with this plan. She also understands that She can call clinic at any time with any questions, concerns, or complaints.   Cancer Staging No matching staging information was found for the patient.  Lloyd Huger, MD   01/15/2019 9:44 AM

## 2019-01-15 NOTE — Progress Notes (Signed)
Request for refill on Tessalon sent in.

## 2019-01-18 ENCOUNTER — Other Ambulatory Visit
Admission: RE | Admit: 2019-01-18 | Discharge: 2019-01-18 | Disposition: A | Discharge status: Home / Self Care | Payer: Medicare Other | Source: Ambulatory Visit | Attending: Internal Medicine | Admitting: Internal Medicine

## 2019-01-18 ENCOUNTER — Telehealth: Payer: Self-pay | Admitting: *Deleted

## 2019-01-18 DIAGNOSIS — N183 Chronic kidney disease, stage 3 unspecified: Secondary | ICD-10-CM

## 2019-01-18 LAB — BASIC METABOLIC PANEL
Anion gap: 8 (ref 5–15)
BUN: 28 mg/dL — ABNORMAL HIGH (ref 6–20)
CO2: 19 mmol/L — ABNORMAL LOW (ref 22–32)
Calcium: 8.2 mg/dL — ABNORMAL LOW (ref 8.9–10.3)
Chloride: 109 mmol/L (ref 98–111)
Creatinine, Ser: 1.65 mg/dL — ABNORMAL HIGH (ref 0.44–1.00)
GFR calc Af Amer: 45 mL/min — ABNORMAL LOW (ref >60)
GFR calc non Af Amer: 39 mL/min — ABNORMAL LOW (ref >60)
Glucose, Bld: 313 mg/dL — ABNORMAL HIGH (ref 70–99)
Potassium: 3.9 mmol/L (ref 3.5–5.1)
Sodium: 136 mmol/L (ref 135–145)

## 2019-01-18 LAB — HEMOGLOBINOPATHY EVALUATION
Hgb A2 Quant: 1.8 % (ref 1.8–3.2)
Hgb A: 98.2 % (ref 96.4–98.8)
Hgb C: 0 %
Hgb F Quant: 0 % (ref 0.0–2.0)
Hgb S Quant: 0 %
Hgb Variant: 0 %

## 2019-01-18 NOTE — Telephone Encounter (Signed)
-----   Message from [name], MD sent at 01/18/2019 11:22 AM EST ----- Please let Ms. Lanzo know that her creatinine is slightly above her baseline.  Potassium is stable.  I recommend that she decrease furosemide to 40 mg every other day.  We should recheck a BMP when she returns to see Christell Faith, PA, on 2/20.

## 2019-01-18 NOTE — Telephone Encounter (Signed)
No answer. Left message to call back.   

## 2019-01-19 MED ORDER — FUROSEMIDE 40 MG PO TABS: 40.0000 mg | ORAL_TABLET | ORAL | 5 refills | Status: DC

## 2019-01-19 NOTE — Telephone Encounter (Signed)
Patient verbalized understanding to change to taking furosemide 40 mg every other day and to keep upcoming appointment for repeat lab work at that time. Med list updated. Routing to [employer] as [employer].

## 2019-01-21 ENCOUNTER — Encounter: Payer: Medicare Other | Attending: Internal Medicine | Admitting: *Deleted

## 2019-01-21 ENCOUNTER — Encounter: Payer: Self-pay | Admitting: *Deleted

## 2019-01-21 VITALS — Ht 66.4 in | Wt 275.8 lb

## 2019-01-21 DIAGNOSIS — K3184 Gastroparesis: Secondary | ICD-10-CM | POA: Insufficient documentation

## 2019-01-21 DIAGNOSIS — I5032 Chronic diastolic (congestive) heart failure: Secondary | ICD-10-CM | POA: Insufficient documentation

## 2019-01-21 DIAGNOSIS — E669 Obesity, unspecified: Secondary | ICD-10-CM | POA: Diagnosis not present

## 2019-01-21 DIAGNOSIS — Z7902 Long term (current) use of antithrombotics/antiplatelets: Secondary | ICD-10-CM | POA: Diagnosis not present

## 2019-01-21 DIAGNOSIS — Z87891 Personal history of nicotine dependence: Secondary | ICD-10-CM | POA: Diagnosis not present

## 2019-01-21 DIAGNOSIS — E1143 Type 2 diabetes mellitus with diabetic autonomic (poly)neuropathy: Secondary | ICD-10-CM | POA: Insufficient documentation

## 2019-01-21 DIAGNOSIS — Z79899 Other long term (current) drug therapy: Secondary | ICD-10-CM | POA: Diagnosis not present

## 2019-01-21 DIAGNOSIS — N183 Chronic kidney disease, stage 3 (moderate): Secondary | ICD-10-CM | POA: Insufficient documentation

## 2019-01-21 DIAGNOSIS — D509 Iron deficiency anemia, unspecified: Secondary | ICD-10-CM | POA: Insufficient documentation

## 2019-01-21 DIAGNOSIS — I130 Hypertensive heart and chronic kidney disease with heart failure and stage 1 through stage 4 chronic kidney disease, or unspecified chronic kidney disease: Secondary | ICD-10-CM | POA: Diagnosis not present

## 2019-01-21 DIAGNOSIS — I2510 Atherosclerotic heart disease of native coronary artery without angina pectoris: Secondary | ICD-10-CM | POA: Insufficient documentation

## 2019-01-21 DIAGNOSIS — Z7982 Long term (current) use of aspirin: Secondary | ICD-10-CM | POA: Diagnosis not present

## 2019-01-21 DIAGNOSIS — Z794 Long term (current) use of insulin: Secondary | ICD-10-CM | POA: Diagnosis not present

## 2019-01-21 DIAGNOSIS — Z955 Presence of coronary angioplasty implant and graft: Secondary | ICD-10-CM | POA: Insufficient documentation

## 2019-01-21 DIAGNOSIS — E1122 Type 2 diabetes mellitus with diabetic chronic kidney disease: Secondary | ICD-10-CM | POA: Insufficient documentation

## 2019-01-21 NOTE — Progress Notes (Signed)
Daily Session Note  Patient Details  Name: [name] [name] MRN: [id number] Date of Birth: 10/05/1981 Referring Provider:     Cardiac Rehab from 01/21/2019 in [facility] Cardiac [facility] Pulmonary Rehab  Referring Provider  End, Christiopher MD [Attending Cardiologist: Dr. Rogue Jury Arida]      Encounter Date: 01/21/2019  Check In: Session Check In - 01/21/19 1416      Check-In   Supervising physician immediately available to respond to emergencies  See telemetry face sheet for immediately available ER MD    Location  [facility]-Cardiac & Pulmonary Rehab    Staff Present  Renita Papa, RN BSN;Jessica Luan Pulling, MA, RCEP, CCRP, Exercise Physiologist    Medication changes reported      No    Fall or balance concerns reported     No    Warm-up and Cool-down  Not performed (comment)   med review completed   Resistance Training Performed  Yes    VAD Patient?  No    PAD/SET Patient?  No      Pain Assessment   Currently in Pain?  No/denies        Exercise Prescription Changes - 01/21/19 1300      Response to Exercise   Blood Pressure (Admit)  126/74    Blood Pressure (Exercise)  156/74    Blood Pressure (Exit)  130/70    Heart Rate (Admit)  103 bpm    Heart Rate (Exercise)  135 bpm    Heart Rate (Exit)  108 bpm    Oxygen Saturation (Admit)  96 %    Oxygen Saturation (Exercise)  97 %    Rating of Perceived Exertion (Exercise)  12    Perceived Dyspnea (Exercise)  1    Symptoms  legs tired and pain 9/10, SOB    Comments  walk test results       Social History   Tobacco Use  Smoking Status Former Smoker  . Years: 15.00  . Last attempt to quit: 03/10/2015  . Years since quitting: 3.8  Smokeless Tobacco Never Used    Goals Met:  Proper associated with RPD/PD & O2 Sat Exercise tolerated well Personal goals reviewed No report of cardiac concerns or symptoms Strength training completed today  Goals Unmet:  Not Applicable  Comments: Med Review completed    Dr. Emily Filbert is  Medical Director for [facility].

## 2019-01-21 NOTE — Progress Notes (Signed)
Cardiac Individual Treatment Plan  Patient Details  Name: [name] [name] MRN: [id number] Date of Birth: 03-Aug-1981 Referring Provider:     Cardiac Rehab from 01/21/2019 in [facility] Cardiac and Pulmonary Rehab  Referring Provider  End, Christiopher MD [Attending Cardiologist: Dr. Rogue Jury Arida]      Initial Encounter Date:    Cardiac Rehab from 01/21/2019 in [facility] Cardiac and Pulmonary Rehab  Date  01/21/19      Visit Diagnosis: Status post coronary artery stent placement  Patient's Home Medications on Admission:  Current Outpatient Medications:  .  aspirin EC 81 MG tablet, Take 81 mg by mouth daily., Disp: , Rfl:  .  benzonatate (TESSALON) 100 MG capsule, Take 1 capsule (100 mg total) by mouth 2 (two) times daily as needed for cough., Disp: 30 capsule, Rfl: 12 .  Cholecalciferol 2000 units TABS, Take 2,000 Units by mouth daily. , Disp: , Rfl:  .  clopidogrel (PLAVIX) 75 MG tablet, Take 4 tabs at 7pm tonight (12/28), then 1 tab daily beginning 12/29. (Patient taking differently: Take 75 mg by mouth daily. ), Disp: 90 tablet, Rfl: 3 .  DULoxetine (CYMBALTA) 30 MG capsule, Take 60 mg by mouth daily. , Disp: , Rfl: 3 .  etonogestrel (NEXPLANON) 68 MG IMPL implant, 1 each by Subdermal route once., Disp: , Rfl:  .  FARXIGA 10 MG TABS tablet, Take 10 mg by mouth daily. , Disp: , Rfl: 11 .  ferrous sulfate 325 (65 FE) MG tablet, Take 1 tablet (325 mg total) by mouth daily., Disp: , Rfl:  .  furosemide (LASIX) 40 MG tablet, Take 1 tablet (40 mg total) by mouth every other day., Disp: 30 tablet, Rfl: 5 .  glimepiride (AMARYL) 4 MG tablet, TAKE 1 TABLET BY MOUTH ONCE DAILY (Patient taking differently: Take 4 mg by mouth daily. ), Disp: 90 tablet, Rfl: 1 .  glucose blood (ACCU-CHEK ACTIVE STRIPS) test strip, Use as instructed, Disp: 100 each, Rfl: 12 .  glucose blood (ACCU-CHEK AVIVA) test strip, 1 each by Other route as needed for other. Use as instructed, Disp: , Rfl:  .  insulin aspart (NOVOLOG)  100 UNIT/ML injection, Inject 10 Units into the skin 3 (three) times daily with meals. (Patient taking differently: Inject 6-20 Units into the skin 3 (three) times daily with meals. ), Disp: 10 mL, Rfl: 11 .  insulin glargine (LANTUS) 100 UNIT/ML injection, Inject 0.4 mLs (40 Units total) into the skin at bedtime. (Patient taking differently: Inject 60 Units into the skin at bedtime. ), Disp: 10 mL, Rfl: 11 .  Insulin Pen Needle 32G X 4 MM MISC, 1 Units by Does not apply route every morning. Pen needles, Disp: 90 each, Rfl: 3 .  isosorbide mononitrate (IMDUR) 30 MG 24 hr tablet, Take 2 tablets (60 mg total) by mouth daily., Disp: 90 tablet, Rfl: 3 .  loperamide (IMODIUM A-D) 2 MG tablet, Take 8-12 mg by mouth daily as needed for diarrhea or loose stools., Disp: , Rfl:  .  meclizine (ANTIVERT) 25 MG tablet, Take 25 mg by mouth 3 (three) times daily as needed for dizziness., Disp: , Rfl:  .  metoprolol tartrate (LOPRESSOR) 50 MG tablet, Take 1 tablet (50 mg total) by mouth 2 (two) times daily., Disp: 180 tablet, Rfl: 3 .  mirtazapine (REMERON) 30 MG tablet, Take 30 mg by mouth daily. , Disp: , Rfl: 3 .  nitroGLYCERIN (NITROSTAT) 0.4 MG SL tablet, Take 1 tab under your tongue while sitting for chest pain, If  no relief may repeat, one tab every 5 min up to 3 tabs total over 15 mins., Disp: 25 tablet, Rfl: 3 .  pantoprazole (PROTONIX) 40 MG tablet, TAKE 1 TABLET BY MOUTH ONCE DAILY AT 6AM (Patient taking differently: Take 40 mg by mouth 2 (two) times daily. ), Disp: 90 tablet, Rfl: 1  Past Medical History: Past Medical History:  Diagnosis Date  . CAD (coronary artery disease)    a. 03/2015 NSTEMI/PCI: LCX 160m(DES), OM1 100p (DES - vessel labeled Ramus in subsequent cath report). Minor irregs to LAD/RCA; b. 04/2015 Cath: LM nl, LAD 40p, RI patent stent, LCX patent stent, RCA nl, EF 55-65%; c. 02/2016 MV: basal inferolateral and mid inferolateral defect/infarct w/o ischemia. EF 55-65%; d. 11/2018 PCI: LM  30ost, LAD 30p, LCX 95p/m (PTCA->20%), OM1 10ost, OM2 40ost, RCA 85p.  .Marland KitchenChronic diastolic (congestive) heart failure (HJohn Day    a. 03/2015 Echo: EF 30-35%, mild concentric LVH, severe HK of inf, inflat, & lat walls, mod MR, mild TR;  b. 04/2015 LV Gram: EF 55-65%; c. 09/2017 Echo: EF 55-60%, no rwma. Mild MR; d. 11/2018 Echo: Ef 60-65%, no rwma.  . CKD (chronic kidney disease), stage III ([place]   . Diabetes type 2, controlled (HHamer    a. since 2002   . Diabetic gastroparesis (HCC)    a. chronic nausea/vomiting.  . Diverticulosis, sigmoid 07/2016  . Heavy menses    a. H/O IUD - expired in 2014 - remains in place.  . Hyperlipidemia   . Hypertension   . Iron deficiency anemia   . Ischemic cardiomyopathy (resolved)    a. 03/2015 EF 30-35% post NSTEMI;  b. 04/2015 EF 55-65% on LV gram; c. 09/2017 Echo: EF 55-60%; d. 11/2018 Echo: Ef 60-65%.  . Obesity   . Tobacco abuse    a. quit 03/2015.  .Marland KitchenUterine fibroid   . Vertigo   . Vitamin D deficiency     Tobacco Use: Social History   Tobacco Use  Smoking Status Former Smoker  . Years: 15.00  . Last attempt to quit: 03/10/2015  . Years since quitting: 3.8  Smokeless Tobacco Never Used    Labs: Recent Review Flowsheet Data    Labs for ITP Cardiac and Pulmonary Rehab Latest Ref Rng & Units 10/27/2017 01/09/2018 01/22/2018 10/06/2018 12/17/2018   Cholestrol 100 - 199 mg/dL - - - 236(H) 245(H)   LDLCALC 0 - 99 mg/dL - - - 163(H) 165(H)   LDLDIRECT 0 - 99 mg/dL - - - - 163(H)   HDL >39 mg/dL - - - 39(L) 53   Trlycerides 0 - 149 mg/dL - - - 171(H) 137   Hemoglobin A1c 4.8 - 5.6 % 11.1(H) 9.6(H) 11.5(H) - -       Exercise Target Goals: Exercise Program Goal: Individual exercise prescription set using results from initial 6 min walk test and THRR while considering  patient's activity barriers and safety.   Exercise Prescription Goal: Initial exercise prescription builds to 30-45 minutes a day of aerobic activity, 2-3 days per week.  Home exercise  guidelines will be given to patient during program as part of exercise prescription that the participant will acknowledge.  Activity Barriers & Risk Stratification: Activity Barriers & Cardiac Risk Stratification - 01/21/19 1355      Activity Barriers & Cardiac Risk Stratification   Activity Barriers  Deconditioning;Muscular Weakness;Shortness of Breath;Balance Concerns;Back Problems;Joint Problems;Decreased Ventricular Function   knees and back can be very painful (chronic), occasional dizzy spells   Cardiac  Risk Stratification  High       6 Minute Walk: 6 Minute Walk    Row Name 01/21/19 1354         6 Minute Walk   Phase  Initial     Distance  850 feet     Walk Time  6 minutes     MPH  1.61     METS  3.5     RPE  12     Perceived Dyspnea   1     VO2 Peak  12.25     Symptoms  Yes (comment)     Comments  legs tired and painful 9/10     Resting HR  103 bpm     Resting BP  126/74     Resting Oxygen Saturation   96 %     Exercise Oxygen Saturation  during 6 min walk  97 %     Max Ex. HR  135 bpm     Max Ex. BP  156/74     2 Minute Post BP  130/70        Oxygen Initial Assessment:   Oxygen Re-Evaluation:   Oxygen Discharge (Final Oxygen Re-Evaluation):   Initial Exercise Prescription: Initial Exercise Prescription - 01/21/19 1300      Date of Initial Exercise RX and Referring Provider   Date  01/21/19    Referring Provider  End, Christiopher MD   Attending Cardiologist: Dr. Kathlyn [place]     Treadmill   MPH  1.4    Grade  0.5    Minutes  15    METs  2.17      REL-XR   Level  1    Speed  50    Minutes  15    METs  2.5      T5 Nustep   Level  2    SPM  80    Minutes  15    METs  2.5      Prescription Details   Frequency (times per week)  3    Duration  Progress to 45 minutes of aerobic exercise without signs/symptoms of physical distress      Intensity   THRR 40-80% of Max Heartrate  135-166    Ratings of Perceived Exertion  11-13     Perceived Dyspnea  0-4      Progression   Progression  Continue to progress workloads to maintain intensity without signs/symptoms of physical distress.      Resistance Training   Training Prescription  Yes    Weight  3 lbs    Reps  10-15       Perform Capillary Blood Glucose checks as needed.  Exercise Prescription Changes: Exercise Prescription Changes    Row Name 01/21/19 1300             Response to Exercise   Blood Pressure (Admit)  126/74       Blood Pressure (Exercise)  156/74       Blood Pressure (Exit)  130/70       Heart Rate (Admit)  103 bpm       Heart Rate (Exercise)  135 bpm       Heart Rate (Exit)  108 bpm       Oxygen Saturation (Admit)  96 %       Oxygen Saturation (Exercise)  97 %       Rating of Perceived Exertion (Exercise)  12  Perceived Dyspnea (Exercise)  1       Symptoms  legs tired and pain 9/10, SOB       Comments  walk test results          Exercise Comments:   Exercise Goals and Review: Exercise Goals    Row Name 01/21/19 1406             Exercise Goals   Increase Physical Activity  Yes       Intervention  Provide advice, education, support and counseling about physical activity/exercise needs.;Develop an individualized exercise prescription for aerobic and resistive training based on initial evaluation findings, risk stratification, comorbidities and participant's personal goals.       Expected Outcomes  Short Term: Attend rehab on a regular basis to increase amount of physical activity.;Long Term: Exercising regularly at least 3-5 days a week.;Long Term: Add in home exercise to make exercise part of routine and to increase amount of physical activity.       Increase Strength and Stamina  Yes       Intervention  Provide advice, education, support and counseling about physical activity/exercise needs.;Develop an individualized exercise prescription for aerobic and resistive training based on initial evaluation findings, risk  stratification, comorbidities and participant's personal goals.       Expected Outcomes  Short Term: Increase workloads from initial exercise prescription for resistance, speed, and METs.;Short Term: Perform resistance training exercises routinely during rehab and add in resistance training at home;Long Term: Improve cardiorespiratory fitness, muscular endurance and strength as measured by increased METs and functional capacity (6MWT)       Able to understand and use rate of perceived exertion (RPE) scale  Yes       Intervention  Provide education and explanation on how to use RPE scale       Expected Outcomes  Short Term: Able to use RPE daily in rehab to express subjective intensity level;Long Term:  Able to use RPE to guide intensity level when exercising independently       Knowledge and understanding of Target Heart Rate Range (THRR)  Yes       Intervention  Provide education and explanation of THRR including how the numbers were predicted and where they are located for reference       Expected Outcomes  Short Term: Able to state/look up THRR;Short Term: Able to use daily as guideline for intensity in rehab;Long Term: Able to use THRR to govern intensity when exercising independently       Able to check pulse independently  Yes       Intervention  Provide education and demonstration on how to check pulse in carotid and radial arteries.;Review the importance of being able to check your own pulse for safety during independent exercise       Expected Outcomes  Short Term: Able to explain why pulse checking is important during independent exercise;Long Term: Able to check pulse independently and accurately       Understanding of Exercise Prescription  Yes       Intervention  Provide education, explanation, and written materials on patient's individual exercise prescription       Expected Outcomes  Short Term: Able to explain program exercise prescription;Long Term: Able to explain home exercise  prescription to exercise independently          Exercise Goals Re-Evaluation :   Discharge Exercise Prescription (Final Exercise Prescription Changes): Exercise Prescription Changes - 01/21/19 1300      Response  to Exercise   Blood Pressure (Admit)  126/74    Blood Pressure (Exercise)  156/74    Blood Pressure (Exit)  130/70    Heart Rate (Admit)  103 bpm    Heart Rate (Exercise)  135 bpm    Heart Rate (Exit)  108 bpm    Oxygen Saturation (Admit)  96 %    Oxygen Saturation (Exercise)  97 %    Rating of Perceived Exertion (Exercise)  12    Perceived Dyspnea (Exercise)  1    Symptoms  legs tired and pain 9/10, SOB    Comments  walk test results       Nutrition:  Target Goals: Understanding of nutrition guidelines, daily intake of sodium <1581m, cholesterol <2011m calories 30% from fat and 7% or less from saturated fats, daily to have 5 or more servings of fruits and vegetables.  Biometrics: Pre Biometrics - 01/21/19 1407      Pre Biometrics   Height  5' 6.4" (1.687 m)    Weight  275 lb 12.8 oz (125.1 kg)    Waist Circumference  50 inches    Hip Circumference  52 inches    Waist to Hip Ratio  0.96 %    BMI (Calculated)  43.96    Single Leg Stand  4.58 seconds        Nutrition Therapy Plan and Nutrition Goals: Nutrition Therapy & Goals - 01/21/19 1429      Intervention Plan   Intervention  Prescribe, educate and counsel regarding individualized specific dietary modifications aiming towards targeted core components such as weight, hypertension, lipid management, diabetes, heart failure and other comorbidities.;Nutrition handout(s) given to patient.    Expected Outcomes  Short Term Goal: Understand basic principles of dietary content, such as calories, fat, sodium, cholesterol and nutrients.;Short Term Goal: A plan has been developed with personal nutrition goals set during dietitian appointment.;Long Term Goal: Adherence to prescribed nutrition plan.       Nutrition  Assessments: Nutrition Assessments - 01/21/19 1429      MEDFICTS Scores   Pre Score  24       Nutrition Goals Re-Evaluation:   Nutrition Goals Discharge (Final Nutrition Goals Re-Evaluation):   Psychosocial: Target Goals: Acknowledge presence or absence of significant depression and/or stress, maximize coping skills, provide positive support system. Participant is able to verbalize types and ability to use techniques and skills needed for reducing stress and depression.   Initial Review & Psychosocial Screening: Initial Psych Review & Screening - 01/21/19 1426      Initial Review   Current issues with  Current Depression;Current Anxiety/Panic;Current Stress Concerns    Source of Stress Concerns  Chronic Illness;Unable to perform yard/household activities;Unable to participate in former interests or hobbies;Family    Comments  FeArlayneeports her quality of life is low due to inability to participate in some daily activities and hobbies related to her lack of stamina. She said her husband, who is a trAdministratorand her 1765ear old daughter are very supportive. She wants to work on her energy level while here      FaSharon Yes   husband, daughter, family     Barriers   Psychosocial barriers to participate in program  There are no identifiable barriers or psychosocial needs.;The patient should benefit from training in stress management and relaxation.      Screening Interventions   Interventions  Encouraged to exercise;Program counselor consult;To provide support and resources with  identified psychosocial needs;Provide feedback about the scores to participant    Expected Outcomes  Short Term goal: Utilizing psychosocial counselor, staff and physician to assist with identification of specific Stressors or current issues interfering with healing process. Setting desired goal for each stressor or current issue identified.;Long Term Goal: Stressors or  current issues are controlled or eliminated.;Short Term goal: Identification and review with participant of any Quality of Life or Depression concerns found by scoring the questionnaire.;Long Term goal: The participant improves quality of Life and PHQ9 Scores as seen by post scores and/or verbalization of changes       Quality of Life Scores:  Quality of Life - 01/21/19 1428      Quality of Life   Select  Quality of Life      Quality of Life Scores   Health/Function Pre  3.2 %    Socioeconomic Pre  15.43 %    Psych/Spiritual Pre  3.43 %    Family Pre  20.4 %    GLOBAL Pre  8.23 %      Scores of 19 and below usually indicate a poorer quality of life in these areas.  A difference of  2-3 points is a clinically meaningful difference.  A difference of 2-3 points in the total score of the Quality of Life Index has been associated with significant improvement in overall quality of life, self-image, physical symptoms, and general health in studies assessing change in quality of life.  PHQ-9: Recent Review Flowsheet Data    Depression screen [facility] 2/9 01/21/2019 10/06/2018 01/09/2018 08/18/2017 08/12/2017   Decreased Interest 3 0 0 1 2   Down, Depressed, Hopeless _0 PHQ - 2 Score _1 Altered sleeping _2 - 3   Tired, decreased energy _3 - 2   Change in appetite _4 - 2   Feeling bad or failure about yourself  _5 - 2   Trouble concentrating _6 - 2   Moving slowly or fidgety/restless 2 0 0 - 0   Suicidal thoughts 0 0 2 0 0   PHQ-9 Score _7 - 15   Difficult doing work/chores Extremely dIfficult Very difficult - - -     Interpretation of Total Score  Total Score Depression Severity:  1-4 = Minimal depression, 5-9 = Mild depression, 10-14 = Moderate depression, 15-19 = Moderately severe depression, 20-27 = Severe depression   Psychosocial Evaluation and Intervention:   Psychosocial Re-Evaluation:   Psychosocial Discharge (Final Psychosocial  Re-Evaluation):   Vocational Rehabilitation: Provide vocational rehab assistance to qualifying candidates.   Vocational Rehab Evaluation & Intervention: Vocational Rehab - 01/21/19 1426      Initial Vocational Rehab Evaluation & Intervention   Assessment shows need for Vocational Rehabilitation  No       Education: Education Goals: Education classes will be provided on a variety of topics geared toward better understanding of heart health and risk factor modification. Participant will state understanding/return demonstration of topics presented as noted by education test scores.  Learning Barriers/Preferences: Learning Barriers/Preferences - 01/21/19 1425      Learning Barriers/Preferences   Learning Barriers  None    Learning Preferences  None       Education Topics:  AED/CPR: - Group verbal and written instruction with the use of models to demonstrate the basic use of the AED with the basic ABC's of resuscitation.   General  Nutrition Guidelines/Fats and Fiber: -Group instruction provided by verbal, written material, models and posters to present the general guidelines for heart healthy nutrition. Gives an explanation and review of dietary fats and fiber.   Controlling Sodium/Reading Food Labels: -Group verbal and written material supporting the discussion of sodium use in heart healthy nutrition. Review and explanation with models, verbal and written materials for utilization of the food label.   Cardiac Rehab from 08/02/2015 in [facility] Cardiac and Pulmonary Rehab  Date  07/31/15  Educator  PI  Instruction Review Code (retired)  2- meets goals/outcomes      Exercise Physiology & General Exercise Guidelines: - Group verbal and written instruction with models to review the exercise physiology of the cardiovascular system and associated critical values. Provides general exercise guidelines with specific guidelines to those with heart or lung disease.    Aerobic Exercise &  Resistance Training: - Gives group verbal and written instruction on the various components of exercise. Focuses on aerobic and resistive training programs and the benefits of this training and how to safely progress through these programs..   Flexibility, Balance, Mind/Body Relaxation: Provides group verbal/written instruction on the benefits of flexibility and balance training, including mind/body exercise modes such as yoga, pilates and tai chi.  Demonstration and skill practice provided.   Stress and Anxiety: - Provides group verbal and written instruction about the health risks of elevated stress and causes of high stress.  Discuss the correlation between heart/lung disease and anxiety and treatment options. Review healthy ways to manage with stress and anxiety.   Depression: - Provides group verbal and written instruction on the correlation between heart/lung disease and depressed mood, treatment options, and the stigmas associated with seeking treatment.   Cardiac Rehab from 08/02/2015 in [facility] Cardiac and Pulmonary Rehab  Date  08/02/15  Educator  [facility]  Instruction Review Code (retired)  2- meets Designer, fashion/clothing & Physiology of the Heart: - Group verbal and written instruction and models provide basic cardiac anatomy and physiology, with the coronary electrical and arterial systems. Review of Valvular disease and Heart Failure   Cardiac Procedures: - Group verbal and written instruction to review commonly prescribed medications for heart disease. Reviews the medication, class of the drug, and side effects. Includes the steps to properly store meds and maintain the prescription regimen. (beta blockers and nitrates)   Cardiac Medications I: - Group verbal and written instruction to review commonly prescribed medications for heart disease. Reviews the medication, class of the drug, and side effects. Includes the steps to properly store meds and maintain the prescription  regimen.   Cardiac Medications II: -Group verbal and written instruction to review commonly prescribed medications for heart disease. Reviews the medication, class of the drug, and side effects. (all other drug classes)    Go Sex-Intimacy & Heart Disease, Get SMART - Goal Setting: - Group verbal and written instruction through game format to discuss heart disease and the return to sexual intimacy. Provides group verbal and written material to discuss and apply goal setting through the application of the S.M.A.R.T. Method.   Other Matters of the Heart: - Provides group verbal, written materials and models to describe Stable Angina and Peripheral Artery. Includes description of the disease process and treatment options available to the cardiac patient.   Exercise & Equipment Safety: - Individual verbal instruction and demonstration of equipment use and safety with use of the equipment.   Cardiac Rehab from 01/21/2019 in [facility] Cardiac and  Pulmonary Rehab  Date  01/21/19  Educator  [facility]  Instruction Review Code  1- Verbalizes Understanding      Infection Prevention: - Provides verbal and written material to individual with discussion of infection control including proper hand washing and proper equipment cleaning during exercise session.   Cardiac Rehab from 01/21/2019 in [facility] Cardiac and Pulmonary Rehab  Date  01/21/19  Educator  [facility]  Instruction Review Code  1- Verbalizes Understanding      Falls Prevention: - Provides verbal and written material to individual with discussion of falls prevention and safety.   Cardiac Rehab from 01/21/2019 in [facility] Cardiac and Pulmonary Rehab  Date  01/21/19  Educator  [facility]  Instruction Review Code  1- Verbalizes Understanding      Diabetes: - Individual verbal and written instruction to review signs/symptoms of diabetes, desired ranges of glucose level fasting, after meals and with exercise. Acknowledge that pre and post exercise glucose checks will  be done for 3 sessions at entry of program.   Cardiac Rehab from 01/21/2019 in [facility] Cardiac and Pulmonary Rehab  Date  01/21/19  Educator  [facility]  Instruction Review Code  1- Verbalizes Understanding      Know Your Numbers and Risk Factors: -Group verbal and written instruction about important numbers in your health.  Discussion of what are risk factors and how they play a role in the disease process.  Review of Cholesterol, Blood Pressure, Diabetes, and BMI and the role they play in your overall health.   Sleep Hygiene: -Provides group verbal and written instruction about how sleep can affect your health.  Define sleep hygiene, discuss sleep cycles and impact of sleep habits. Review good sleep hygiene tips.    Other: -Provides group and verbal instruction on various topics (see comments)   Knowledge Questionnaire Score: Knowledge Questionnaire Score - 01/21/19 1425      Knowledge Questionnaire Score   Pre Score  24/26   correct answers reviewed with [place]. Focus on PAD and Nutrition       Core Components/Risk Factors/Patient Goals at Admission: Personal Goals and Risk Factors at Admission - 01/21/19 1424      Core Components/Risk Factors/Patient Goals on Admission    Weight Management  Yes;Obesity;Weight Loss    Intervention  Weight Management: Develop a combined nutrition and exercise program designed to reach desired caloric intake, while maintaining appropriate intake of nutrient and fiber, sodium and fats, and appropriate energy expenditure required for the weight goal.;Weight Management: Provide education and appropriate resources to help participant work on and attain dietary goals.;Weight Management/Obesity: Establish reasonable short term and long term weight goals.;Obesity: Provide education and appropriate resources to help participant work on and attain dietary goals.    Admit Weight  275 lb 12.8 oz (125.1 kg)    Goal Weight: Short Term  270 lb (122.5 kg)    Goal Weight:  Long Term  140 lb (63.5 kg)    Expected Outcomes  Short Term: Continue to assess and modify interventions until short term weight is achieved;Long Term: Adherence to nutrition and physical activity/exercise program aimed toward attainment of established weight goal;Weight Loss: Understanding of general recommendations for a balanced deficit meal plan, which promotes 1-2 lb weight loss per week and includes a negative energy balance of [id number] kcal/d;Understanding recommendations for meals to include 15-35% energy as protein, 25-35% energy from fat, 35-60% energy from carbohydrates, less than 244m of dietary cholesterol, 20-35 gm of total fiber daily;Understanding of distribution of calorie intake throughout the  day with the consumption of 4-5 meals/snacks    Diabetes  Yes    Intervention  Provide education about signs/symptoms and action to take for hypo/hyperglycemia.;Provide education about proper nutrition, including hydration, and aerobic/resistive exercise prescription along with prescribed medications to achieve blood glucose in normal ranges: Fasting glucose 65-99 mg/dL    Expected Outcomes  Short Term: Participant verbalizes understanding of the signs/symptoms and immediate care of hyper/hypoglycemia, proper foot care and importance of medication, aerobic/resistive exercise and nutrition plan for blood glucose control.;Long Term: Attainment of HbA1C < 7%.    Heart Failure  Yes    Intervention  Provide a combined exercise and nutrition program that is supplemented with education, support and counseling about heart failure. Directed toward relieving symptoms such as shortness of breath, decreased exercise tolerance, and extremity edema.    Expected Outcomes  Improve functional capacity of life;Short term: Attendance in program 2-3 days a week with increased exercise capacity. Reported lower sodium intake. Reported increased fruit and vegetable intake. Reports medication compliance.;Short term: Daily  weights obtained and reported for increase. Utilizing diuretic protocols set by physician.;Long term: Adoption of self-care skills and reduction of barriers for early signs and symptoms recognition and intervention leading to self-care maintenance.    Hypertension  Yes    Intervention  Provide education on lifestyle modifcations including regular physical activity/exercise, weight management, moderate sodium restriction and increased consumption of fresh fruit, vegetables, and low fat dairy, alcohol moderation, and smoking cessation.;Monitor prescription use compliance.    Expected Outcomes  Short Term: Continued assessment and intervention until BP is < 140/52m HG in hypertensive participants. < 130/88mHG in hypertensive participants with diabetes, heart failure or chronic kidney disease.;Long Term: Maintenance of blood pressure at goal levels.    Lipids  Yes    Intervention  Provide education and support for participant on nutrition & aerobic/resistive exercise along with prescribed medications to achieve LDL <7034mHDL >79m19m  Expected Outcomes  Short Term: Participant states understanding of desired cholesterol values and is compliant with medications prescribed. Participant is following exercise prescription and nutrition guidelines.;Long Term: Cholesterol controlled with medications as prescribed, with individualized exercise RX and with personalized nutrition plan. Value goals: LDL < 70mg56mL > 40 mg.       Core Components/Risk Factors/Patient Goals Review:    Core Components/Risk Factors/Patient Goals at Discharge (Final Review):    ITP Comments:   Comments: Initial ITP

## 2019-01-21 NOTE — Patient Instructions (Signed)
Patient Instructions  Patient Details  Name: [name] [name] MRN: [id number] Date of Birth: 1981-03-02 Referring Provider:  [name], MD  Below are your personal goals for exercise, nutrition, and risk factors. Our goal is to help you stay on track towards obtaining and maintaining these goals. We will be discussing your progress on these goals with you throughout the program.  Initial Exercise Prescription: Initial Exercise Prescription - 01/21/19 1300      Date of Initial Exercise RX and Referring Provider   Date  01/21/19    Referring Provider  End, Christiopher MD   Attending Cardiologist: Dr. Kathlyn [place]     Treadmill   MPH  1.4    Grade  0.5    Minutes  15    METs  2.17      REL-XR   Level  1    Speed  50    Minutes  15    METs  2.5      T5 Nustep   Level  2    SPM  80    Minutes  15    METs  2.5      Prescription Details   Frequency (times per week)  3    Duration  Progress to 45 minutes of aerobic exercise without signs/symptoms of physical distress      Intensity   THRR 40-80% of Max Heartrate  135-166    Ratings of Perceived Exertion  11-13    Perceived Dyspnea  0-4      Progression   Progression  Continue to progress workloads to maintain intensity without signs/symptoms of physical distress.      Resistance Training   Training Prescription  Yes    Weight  3 lbs    Reps  10-15       Exercise Goals: Frequency: Be able to perform aerobic exercise two to three times per week in program working toward 2-5 days per week of home exercise.  Intensity: Work with a perceived exertion of 11 (fairly light) - 15 (hard) while following your exercise prescription.  We will make changes to your prescription with you as you progress through the program.   Duration: Be able to do 30 to 45 minutes of continuous aerobic exercise in addition to a 5 minute warm-up and a 5 minute cool-down routine.   Nutrition Goals: Your personal nutrition goals will be  established when you do your nutrition analysis with the dietician.  The following are general nutrition guidelines to follow: Cholesterol < 200mg /day Sodium < 1500mg /day Fiber: Women under 50 yrs - 25 grams per day  Personal Goals: Personal Goals and Risk Factors at Admission - 01/21/19 1424      Core Components/Risk Factors/Patient Goals on Admission    Weight Management  Yes;Obesity;Weight Loss    Intervention  Weight Management: Develop a combined nutrition and exercise program designed to reach desired caloric intake, while maintaining appropriate intake of nutrient and fiber, sodium and fats, and appropriate energy expenditure required for the weight goal.;Weight Management: Provide education and appropriate resources to help participant work on and attain dietary goals.;Weight Management/Obesity: Establish reasonable short term and long term weight goals.;Obesity: Provide education and appropriate resources to help participant work on and attain dietary goals.    Admit Weight  275 lb 12.8 oz (125.1 kg)    Goal Weight: Short Term  270 lb (122.5 kg)    Goal Weight: Long Term  140 lb (63.5 kg)    Expected Outcomes  Short Term:  Continue to assess and modify interventions until short term weight is achieved;Long Term: Adherence to nutrition and physical activity/exercise program aimed toward attainment of established weight goal;Weight Loss: Understanding of general recommendations for a balanced deficit meal plan, which promotes 1-2 lb weight loss per week and includes a negative energy balance of [id number] kcal/d;Understanding recommendations for meals to include 15-35% energy as protein, 25-35% energy from fat, 35-60% energy from carbohydrates, less than 200mg  of dietary cholesterol, 20-35 gm of total fiber daily;Understanding of distribution of calorie intake throughout the day with the consumption of 4-5 meals/snacks    Diabetes  Yes    Intervention  Provide education about signs/symptoms and  action to take for hypo/hyperglycemia.;Provide education about proper nutrition, including hydration, and aerobic/resistive exercise prescription along with prescribed medications to achieve blood glucose in normal ranges: Fasting glucose 65-99 mg/dL    Expected Outcomes  Short Term: Participant verbalizes understanding of the signs/symptoms and immediate care of hyper/hypoglycemia, proper foot care and importance of medication, aerobic/resistive exercise and nutrition plan for blood glucose control.;Long Term: Attainment of HbA1C < 7%.    Heart Failure  Yes    Intervention  Provide a combined exercise and nutrition program that is supplemented with education, support and counseling about heart failure. Directed toward relieving symptoms such as shortness of breath, decreased exercise tolerance, and extremity edema.    Expected Outcomes  Improve functional capacity of life;Short term: Attendance in program 2-3 days a week with increased exercise capacity. Reported lower sodium intake. Reported increased fruit and vegetable intake. Reports medication compliance.;Short term: Daily weights obtained and reported for increase. Utilizing diuretic protocols set by physician.;Long term: Adoption of self-care skills and reduction of barriers for early signs and symptoms recognition and intervention leading to self-care maintenance.    Hypertension  Yes    Intervention  Provide education on lifestyle modifcations including regular physical activity/exercise, weight management, moderate sodium restriction and increased consumption of fresh fruit, vegetables, and low fat dairy, alcohol moderation, and smoking cessation.;Monitor prescription use compliance.    Expected Outcomes  Short Term: Continued assessment and intervention until BP is < 140/34mm HG in hypertensive participants. < 130/39mm HG in hypertensive participants with diabetes, heart failure or chronic kidney disease.;Long Term: Maintenance of blood pressure  at goal levels.    Lipids  Yes    Intervention  Provide education and support for participant on nutrition & aerobic/resistive exercise along with prescribed medications to achieve LDL 70mg , HDL >40mg .    Expected Outcomes  Short Term: Participant states understanding of desired cholesterol values and is compliant with medications prescribed. Participant is following exercise prescription and nutrition guidelines.;Long Term: Cholesterol controlled with medications as prescribed, with individualized exercise RX and with personalized nutrition plan. Value goals: LDL < 70mg , HDL > 40 mg.       Tobacco Use Initial Evaluation: Social History   Tobacco Use  Smoking Status Former Smoker  . Years: 15.00  . Last attempt to quit: 03/10/2015  . Years since quitting: 3.8  Smokeless Tobacco Never Used    Exercise Goals and Review: Exercise Goals    Row Name 01/21/19 1406             Exercise Goals   Increase Physical Activity  Yes       Intervention  Provide advice, education, support and counseling about physical activity/exercise needs.;Develop an individualized exercise prescription for aerobic and resistive training based on initial evaluation findings, risk stratification, comorbidities and participant's personal goals.  Expected Outcomes  Short Term: Attend rehab on a regular basis to increase amount of physical activity.;Long Term: Exercising regularly at least 3-5 days a week.;Long Term: Add in home exercise to make exercise part of routine and to increase amount of physical activity.       Increase Strength and Stamina  Yes       Intervention  Provide advice, education, support and counseling about physical activity/exercise needs.;Develop an individualized exercise prescription for aerobic and resistive training based on initial evaluation findings, risk stratification, comorbidities and participant's personal goals.       Expected Outcomes  Short Term: Increase workloads from  initial exercise prescription for resistance, speed, and METs.;Short Term: Perform resistance training exercises routinely during rehab and add in resistance training at home;Long Term: Improve cardiorespiratory fitness, muscular endurance and strength as measured by increased METs and functional capacity (6MWT)       Able to understand and use rate of perceived exertion (RPE) scale  Yes       Intervention  Provide education and explanation on how to use RPE scale       Expected Outcomes  Short Term: Able to use RPE daily in rehab to express subjective intensity level;Long Term:  Able to use RPE to guide intensity level when exercising independently       Knowledge and understanding of Target Heart Rate Range (THRR)  Yes       Intervention  Provide education and explanation of THRR including how the numbers were predicted and where they are located for reference       Expected Outcomes  Short Term: Able to state/look up THRR;Short Term: Able to use daily as guideline for intensity in rehab;Long Term: Able to use THRR to govern intensity when exercising independently       Able to check pulse independently  Yes       Intervention  Provide education and demonstration on how to check pulse in carotid and radial arteries.;Review the importance of being able to check your own pulse for safety during independent exercise       Expected Outcomes  Short Term: Able to explain why pulse checking is important during independent exercise;Long Term: Able to check pulse independently and accurately       Understanding of Exercise Prescription  Yes       Intervention  Provide education, explanation, and written materials on patient's individual exercise prescription       Expected Outcomes  Short Term: Able to explain program exercise prescription;Long Term: Able to explain home exercise prescription to exercise independently          Copy of goals given to participant.

## 2019-01-26 NOTE — Progress Notes (Signed)
Cardiology Office Note Date:  01/28/2019  Patient ID:  [name], [name] 1981-01-27, MRN [id number] PCP:  Venita Lick, NP  Cardiologist:  Dr. Fletcher Anon, MD    Chief Complaint: Hospital follow up  History of Present Illness: [name] [name] is a 38 y.o. female with history of CAD s/p prior LCx and OM stenting in 03/2015 s/p PTCA of LCx ISR in 11/2018 followed by staged PCI to the RCA in 01/2019, chronic combined CHF/ICM with subsequent normalization of the LVSF, DM2 diagnosed in 2000, CKD stage II-III, anemia, diabetic gastroparesis, HTN, and HLD who presents for hospital follow-up after recent admission to [facility] from 2/4 through 2/5 for staged PCI.  Cardiac history dates back to4/2016, when she wasadmittedwith chest pain andruled in for a NSTEMI.She was found to have LV dysfunction with an EF of 30 to 35%, and underwent diagnostic caththatrevealedsevere LCxand OM1 disease. Both areas were successfully treated with PCI/DES.Follow-up cath in5/2016,revealed patency of both stents with normalization of LV function by LV gram.Of note, there is a report from5/2016, which refers to the obtuse marginal stent as a ramus intermedius stent. Stress testing was performed in3/2017,which was nonischemic and low risk. She has had ongoing intermittent chest pain over the years, typically occurring 2-3 times per week. She was seen in the office on 11/10/2018 with a noted increase in [place] as well as more frequent exertional and rest chest discomfort. Her weight was up 28 pounds when compared to her 06/2018 visit, despite reporting reduced oral intake. Low-dose Lasix was added with stable renal function being noted. Her weight gain was felt to be far out of proportion to her volume status. Subsequent echo showed an EF of 60-65%. She was seen in the office on 12/03/2018 and noted an improvement in her lower extremity edema since starting Lasix, but continued to note DOE and chest discomfort at rest and with  exertion. Ischemic evaluation options were discussed with the patient with plan to proceed with diagnostic cardiac cath, scheduled for 12/04/2018, which showed significant two-vessel CAD with patent stent in OM1, and focal in-stent restenosis in the mid LCx at the origin of OM2.  She also had new significant disease in the proximal to mid RCA.  The LCx was successfully treated with balloon angioplasty.  This was guided by intravascular ultrasound and residual 20% stenosis was noted.  It was recommended she undergo staged PCI to the RCA given the patient's underlying CKD.  Initially, the patient was placed on Brilinta however she knew she would not be able to afford this as she had previously used an assistance card.  In that setting, at time of discharge, she was loaded with Plavix.  Her Lasix was changed to as needed.  Post-cath renal function showed slight increase in creatinine to 1.89.  In hospital follow-up in 12/2018 she continued to note intermittent chest pain, shortness of breath, and lower extremity swelling with symptoms being exacerbated by exertion.  She was compliant with medications.  She was noted to have compression stockings at home though did not wear them.  She reported taking Lasix every other day to every 2 days secondary to lower extremity swelling.  She underwent staged PCI on 01/12/2019 with cath showing stable appearance of the coronary arteries since the 12/04/2018 procedure with 80 to 90% mid RCA stenosis and 20 to 30% ISR in the mid LCx.  She underwent successful PCI/DES to the mid RCA with 0% residual stenosis and TIMI-3 flow.  Moderately to severely elevated LV filling pressure  was noted.  Post-cath hydration was deferred secondary to her elevated filling pressures noted during the study.  At time of discharge, her Lasix was increased to 40 mg daily.  Following this, recheck BMP on 01/18/2019 showed a slightly elevated serum creatinine of 1.65 with a baseline of 1.3-1.4.  In this  setting, her Lasix was changed to 40 mg every other day.  Post-cath CBC showed a low though stable hemoglobin of 9.3.  She comes in accompanied by her mother today and is doing reasonably well from a chest pain standpoint.  She has not had any further chest pain.  She does note some fatigue as well as dizziness for the past 3 days.  Dizziness is associated with positional movement as well as being described as a vertigo-like sensation.  She has as needed meclizine for vertigo though has not taken any.  There is associated shortness of breath with her dizziness.  No falls.  No BRBPR or melena.  Lower extremity swelling has resolved.  No abdominal distention, orthopnea, PND, or early satiety.  She is not checking her blood pressures at home.  She is currently taking aspirin 81 mg daily, Plavix 75 mg daily, Lasix 40 mg every other day with her last dose being 01/27/2019, Avapro 150 mg daily, Imdur 60 mg daily, Lopressor 50 mg twice daily.  She has not needed any as needed sublingual nitroglycerin.  No issues from her cardiac cath site.  Patient's mother states the patient is doing quite well.    Past Medical History:  Diagnosis Date  . CAD (coronary artery disease)    a. 03/2015 NSTEMI/PCI: LCX 159m (DES), OM1 100p (DES - vessel labeled Ramus in subsequent cath report). Minor irregs to LAD/RCA; b. 04/2015 Cath: LM nl, LAD 40p, RI patent stent, LCX patent stent, RCA nl, EF 55-65%; c. 02/2016 MV: basal inferolateral and mid inferolateral defect/infarct w/o ischemia. EF 55-65%; d. 11/2018 PCI: LM 30ost, LAD 30p, LCX 95p/m (PTCA->20%), OM1 10ost, OM2 40ost, RCA 85p.  Marland Kitchen Chronic diastolic (congestive) heart failure ([place])    a. 03/2015 Echo: EF 30-35%, mild concentric LVH, severe HK of inf, inflat, & lat walls, mod MR, mild TR;  b. 04/2015 LV Gram: EF 55-65%; c. 09/2017 Echo: EF 55-60%, no rwma. Mild MR; d. 11/2018 Echo: Ef 60-65%, no rwma.  . CKD (chronic kidney disease), stage III ([place])   . Diabetes type 2,  controlled ([place])    a. since 2002   . Diabetic gastroparesis ([facility])    a. chronic nausea/vomiting.  . Diverticulosis, sigmoid 07/2016  . Heavy menses    a. H/O IUD - expired in 2014 - remains in place.  . Hyperlipidemia   . Hypertension   . Iron deficiency anemia   . Ischemic cardiomyopathy (resolved)    a. 03/2015 EF 30-35% post NSTEMI;  b. 04/2015 EF 55-65% on LV gram; c. 09/2017 Echo: EF 55-60%; d. 11/2018 Echo: Ef 60-65%.  . Obesity   . Tobacco abuse    a. quit 03/2015.  Marland Kitchen Uterine fibroid   . Vertigo   . Vitamin D deficiency     Past Surgical History:  Procedure Laterality Date  . APPENDECTOMY    . CARDIAC CATHETERIZATION  4/16   x2 stent [facility]  . CARDIAC CATHETERIZATION N/A 05/05/2015   Procedure: Left Heart Cath and Coronary Angiography;  Surgeon: Peter M Martinique, MD;  Location: [place] CV LAB;  Service: Cardiovascular;  Laterality: N/A;  . COLONOSCOPY WITH PROPOFOL N/A 07/31/2016   Procedure: COLONOSCOPY  WITH PROPOFOL;  Surgeon: Lollie Sails, MD;  Location[facility] ENDOSCOPY;  Service: Endoscopy;  Laterality: N/A;  . CORONARY BALLOON ANGIOPLASTY N/A 12/04/2018   Procedure: CORONARY BALLOON ANGIOPLASTY;  Surgeon: Wellington Hampshire, MD;  Location: [place] CV LAB;  Service: Cardiovascular;  Laterality: N/A;  . CORONARY STENT INTERVENTION N/A 01/12/2019   Procedure: CORONARY STENT INTERVENTION;  Surgeon: [name], MD;  Location: [place] CV LAB;  Service: Cardiovascular;  Laterality: N/A;  . ESOPHAGOGASTRODUODENOSCOPY (EGD) WITH PROPOFOL N/A 07/31/2016   Procedure: ESOPHAGOGASTRODUODENOSCOPY (EGD) WITH PROPOFOL;  Surgeon: Lollie Sails, MD;  Location[facility] ENDOSCOPY;  Service: Endoscopy;  Laterality: N/A;  . INTRAVASCULAR PRESSURE WIRE/FFR STUDY N/A 12/04/2018   Procedure: INTRAVASCULAR PRESSURE WIRE/FFR STUDY;  Surgeon: Wellington Hampshire, MD;  Location: [place] [facility] LAB;  Service: Cardiovascular;  Laterality: N/A;  . LAPAROSCOPIC APPENDECTOMY N/A  08/07/2016   Procedure: APPENDECTOMY LAPAROSCOPIC;  Surgeon: Hubbard Robinson, MD;  Location: [facility] ORS;  Service: General;  Laterality: N/A;  . LEFT HEART CATH AND CORONARY ANGIOGRAPHY Left 12/04/2018   Procedure: LEFT HEART CATH AND CORONARY ANGIOGRAPHY;  Surgeon: Wellington Hampshire, MD;  Location: [place] [facility] LAB;  Service: Cardiovascular;  Laterality: Left;  . LEFT HEART CATH AND CORONARY ANGIOGRAPHY N/A 01/12/2019   Procedure: LEFT HEART CATH AND CORONARY ANGIOGRAPHY;  Surgeon: [name], MD;  Location: [place] [facility] LAB;  Service: Cardiovascular;  Laterality: N/A;  . MOUTH SURGERY      Current Meds  Medication Sig  . aspirin EC 81 MG tablet Take 81 mg by mouth daily.  . benzonatate (TESSALON) 100 MG capsule Take 1 capsule (100 mg total) by mouth 2 (two) times daily as needed for cough.  . Cholecalciferol 2000 units TABS Take 2,000 Units by mouth daily.   . clopidogrel (PLAVIX) 75 MG tablet Take 4 tabs at 7pm tonight (12/28), then 1 tab daily beginning 12/29. (Patient taking differently: Take 75 mg by mouth daily. )  . DULoxetine (CYMBALTA) 30 MG capsule Take 60 mg by mouth daily.   Marland Kitchen etonogestrel (NEXPLANON) 68 MG IMPL implant 1 each by Subdermal route once.  Marland Kitchen FARXIGA 10 MG TABS tablet Take 10 mg by mouth daily.   . ferrous sulfate 325 (65 FE) MG tablet Take 1 tablet (325 mg total) by mouth daily.  . furosemide (LASIX) 40 MG tablet Take 1 tablet (40 mg total) by mouth every other day.  Marland Kitchen glimepiride (AMARYL) 4 MG tablet TAKE 1 TABLET BY MOUTH ONCE DAILY (Patient taking differently: Take 4 mg by mouth daily. )  . glucose blood (ACCU-CHEK ACTIVE STRIPS) test strip Use as instructed  . glucose blood (ACCU-CHEK AVIVA) test strip 1 each by Other route as needed for other. Use as instructed  . insulin aspart (NOVOLOG) 100 UNIT/ML injection Inject 10 Units into the skin 3 (three) times daily with meals. (Patient taking differently: Inject 6-20 Units into the skin 3 (three) times  daily with meals. )  . insulin glargine (LANTUS) 100 UNIT/ML injection Inject 0.4 mLs (40 Units total) into the skin at bedtime. (Patient taking differently: Inject 60 Units into the skin at bedtime. )  . Insulin Pen Needle 32G X 4 MM MISC 1 Units by Does not apply route every morning. Pen needles  . isosorbide mononitrate (IMDUR) 30 MG 24 hr tablet Take 2 tablets (60 mg total) by mouth daily.  Marland Kitchen loperamide (IMODIUM A-D) 2 MG tablet Take 8-12 mg by mouth daily as needed for diarrhea or loose stools.  Marland Kitchen  meclizine (ANTIVERT) 25 MG tablet Take 25 mg by mouth 3 (three) times daily as needed for dizziness.  . metoprolol tartrate (LOPRESSOR) 50 MG tablet Take 1 tablet (50 mg total) by mouth 2 (two) times daily.  . mirtazapine (REMERON) 30 MG tablet Take 30 mg by mouth daily.   . nitroGLYCERIN (NITROSTAT) 0.4 MG SL tablet Take 1 tab under your tongue while sitting for chest pain, If no relief may repeat, one tab every 5 min up to 3 tabs total over 15 mins.  . pantoprazole (PROTONIX) 40 MG tablet TAKE 1 TABLET BY MOUTH ONCE DAILY AT 6AM (Patient taking differently: Take 40 mg by mouth 2 (two) times daily. )    Allergies:   Lisinopril; Rosemary oil; Shellfish allergy; Other; and Metformin and related   Social History:  The patient  reports that she quit smoking about 3 years ago. She quit after 15.00 years of use. She has never used smokeless tobacco. She reports that she does not drink alcohol or use drugs.   Family History:  The patient's family history includes Cancer in her maternal grandfather and maternal grandmother; Cancer - Cervical in her maternal aunt; Diabetes in her father, paternal grandfather, and paternal grandmother.  ROS:   Review of Systems  Constitutional: Positive for malaise/fatigue. Negative for chills, diaphoresis, fever and weight loss.  HENT: Negative for congestion.   Eyes: Negative for discharge and redness.  Respiratory: Positive for shortness of breath. Negative for cough,  hemoptysis, sputum production and wheezing.   Cardiovascular: Positive for palpitations. Negative for chest pain, orthopnea, claudication, leg swelling and PND.  Gastrointestinal: Negative for abdominal pain, blood in stool, heartburn, melena, nausea and vomiting.  Genitourinary: Negative for hematuria.  Musculoskeletal: Negative for falls and myalgias.  Skin: Negative for rash.  Neurological: Positive for dizziness and weakness. Negative for tingling, tremors, sensory change, speech change, focal weakness and loss of consciousness.  Endo/Heme/Allergies: Does not bruise/bleed easily.  Psychiatric/Behavioral: Negative for substance abuse. The patient is not nervous/anxious.   All other systems reviewed and are negative.    PHYSICAL EXAM:  VS:  BP (!) 146/88 (BP Location: Left Arm, Patient Position: Sitting, Cuff Size: Large)   Pulse (!) 108   Ht 5\' 5"  (1.651 m)   Wt 273 lb (123.8 kg)   BMI 45.43 kg/m  BMI: Body mass index is 45.43 kg/m.  Physical Exam  Constitutional: She is oriented to person, place, and time. She appears well-developed and well-nourished.  HENT:  Head: Normocephalic and atraumatic.  Eyes: Right eye exhibits no discharge. Left eye exhibits no discharge.  Neck: Normal range of motion. No JVD present.  Cardiovascular: Normal rate, regular rhythm, S1 normal, S2 normal and normal heart sounds. Exam reveals no distant heart sounds, no friction rub, no midsystolic click and no opening snap.  No murmur heard. Pulses:      Posterior tibial pulses are 2+ on the right side and 2+ on the left side.  Right cardiac cath site is well-healing without active bleeding, bruising, swelling, warmth, erythema, or tenderness to palpation.  Radial pulse 2+.  Pulmonary/Chest: Effort normal and breath sounds normal. No respiratory distress. She has no decreased breath sounds. She has no wheezes. She has no rales. She exhibits no tenderness.  Abdominal: Soft. She exhibits no distension. There  is no abdominal tenderness.  Musculoskeletal:        General: No edema.  Neurological: She is alert and oriented to person, place, and time.  Skin: Skin is warm and  dry. No cyanosis. Nails show no clubbing.  Psychiatric: She has a normal mood and affect. Her speech is normal and behavior is normal. Judgment and thought content normal.     EKG:  Was ordered and interpreted by me today. Shows sinus tachycardia, 1 week BPM, possible LAE, prior septal infarct, no acute ST-T changes  Recent Labs: 05/01/2018: Magnesium 1.9 12/17/2018: ALT 35 01/14/2019: Hemoglobin 9.3; Platelets 409 01/18/2019: BUN 28; Creatinine, Ser 1.65; Potassium 3.9; Sodium 136  12/17/2018: Chol/HDL Ratio 4.6; Cholesterol, Total 245; HDL 53; LDL Calculated 165; LDL Direct 163; Triglycerides 137   Estimated Creatinine Clearance: 60.5 mL/min (A) (by C-G formula based on SCr of 1.65 mg/dL (H)).   Wt Readings from Last 3 Encounters:  01/28/19 273 lb (123.8 kg)  01/21/19 275 lb 12.8 oz (125.1 kg)  01/14/19 284 lb (128.8 kg)    Orthostatic vital signs: Lying: 157/100, 109 bpm, dizzy Sitting: 146/88, 112 bpm, dizzy  Standing: 128/78, 116 bpm, dizzy (no worse) Standing x3 minutes: 126/68, 140 bpm, dizzy (no worse)  Other studies reviewed: Additional studies/records reviewed today include: summarized above  ASSESSMENT AND PLAN:  1. CAD involving the native coronary arteries without angina: Status post staged PCI as outlined above.  She is doing well without any symptoms concerning for angina.  Continue dual antiplatelet therapy with aspirin and Plavix as well as Imdur and Lopressor.  She is no longer on statin secondary to intolerance.  In follow-up, we will need to revisit the possibility of PCSK9 inhibitor.  Aggressive risk factor modification and secondary prevention.  No plans for further ischemic evaluation at this time.  2. Chronic combined CHF with subsequent normalization of EF: She reports weight decreased from 284  pounds to 273 pounds.  Lower extremity swelling has resolved.  With her recent weight gain, this has been felt to be out of proportion to her volume status and laboratory evaluation.  It has been felt that some of her weight gain has been in the setting of excessive calorie intake.  She does not appear volume overloaded on exam today and based on orthostatic vital signs certainly may be volume depleted.  We are checking a CBC and BMP today.  If she continues to show elevation in serum creatinine we will need to decrease her Lasix dose further.  3. Dizziness/palpitations: Patient has history of dizziness that has been improved with meclizine however she has not taken any.  I recommend to take her as needed meclizine.  We will check outpatient cardiac monitoring to evaluate for significant arrhythmia.  Check CBC and BMP as above.  If work-up is unrevealing, recommend she follow-up with PCP.  We may need to decrease her Lasix as outlined above.  4. Orthostatic hypotension: Recommend patient increase fluids.  If labs indicate, recommend decreasing Lasix as above.  5. CKD stage II-III: Check BMP as above.  Recently started on Avapro by outside office for renal protection.  6. Hyperlipidemia: Most recent LDL of 163 from 09/2018 which was obtained while not on a statin.  She has discontinued Lipitor and Crestor secondary to myalgias.  We have previously discussed transitioning her to a PCSK9 inhibitor and this will need to be revisited in follow-up.  Previously, both the patient and mother were agreeable to this therapy.  7. Iron deficiency anemia: Check CBC given dual antiplatelet therapy and associated positional dizziness.  8. Morbid obesity: Weight loss is advised.  She may benefit from outpatient sleep study.  Disposition: F/u with Dr. Fletcher Anon or an  APP in 3 months.  Current medicines are reviewed at length with the patient today.  The patient did not have any concerns regarding  medicines.  SignedChristell Faith, PA-C 01/28/2019 11:05 AM     [facility] [address] [place] [address] [place], [place] [address] [phone]

## 2019-01-28 ENCOUNTER — Ambulatory Visit (INDEPENDENT_AMBULATORY_CARE_PROVIDER_SITE_OTHER): Payer: Medicare Other

## 2019-01-28 ENCOUNTER — Ambulatory Visit (INDEPENDENT_AMBULATORY_CARE_PROVIDER_SITE_OTHER): Payer: Medicare Other | Admitting: Physician Assistant

## 2019-01-28 ENCOUNTER — Encounter: Payer: Self-pay | Admitting: Physician Assistant

## 2019-01-28 VITALS — BP 146/88 | HR 108 | Ht 65.0 in | Wt 273.0 lb

## 2019-01-28 DIAGNOSIS — I200 Unstable angina: Secondary | ICD-10-CM

## 2019-01-28 DIAGNOSIS — I2510 Atherosclerotic heart disease of native coronary artery without angina pectoris: Secondary | ICD-10-CM

## 2019-01-28 DIAGNOSIS — E785 Hyperlipidemia, unspecified: Secondary | ICD-10-CM | POA: Diagnosis not present

## 2019-01-28 DIAGNOSIS — I951 Orthostatic hypotension: Secondary | ICD-10-CM | POA: Diagnosis not present

## 2019-01-28 DIAGNOSIS — R002 Palpitations: Secondary | ICD-10-CM

## 2019-01-28 DIAGNOSIS — Z789 Other specified health status: Secondary | ICD-10-CM

## 2019-01-28 DIAGNOSIS — R42 Dizziness and giddiness: Secondary | ICD-10-CM | POA: Diagnosis not present

## 2019-01-28 DIAGNOSIS — D509 Iron deficiency anemia, unspecified: Secondary | ICD-10-CM

## 2019-01-28 DIAGNOSIS — N182 Chronic kidney disease, stage 2 (mild): Secondary | ICD-10-CM | POA: Diagnosis not present

## 2019-01-28 DIAGNOSIS — I10 Essential (primary) hypertension: Secondary | ICD-10-CM

## 2019-01-28 DIAGNOSIS — I5042 Chronic combined systolic (congestive) and diastolic (congestive) heart failure: Secondary | ICD-10-CM | POA: Diagnosis not present

## 2019-01-28 NOTE — Patient Instructions (Signed)
Medication Instructions:  Your physician recommends that you continue on your current medications as directed. Please refer to the Current Medication list given to you today.  If you need a refill on your cardiac medications before your next appointment, please call your pharmacy.   Lab work: Your physician recommends that you return for lab work in: TODAY - [place], BMET.  If you have labs (blood work) drawn today and your tests are completely normal, you will receive your results only by: Marland Kitchen MyChart Message (if you have MyChart) OR . A paper copy in the mail If you have any lab test that is abnormal or we need to change your treatment, we will call you to review the results.  Testing/Procedures: Your physician has recommended that you wear an 14 DAY ZIO event monitor. Event monitors are medical devices that record the heart's electrical activity. Doctors most often [place] these monitors to diagnose arrhythmias. Arrhythmias are problems with the speed or rhythm of the heartbeat. The monitor is a small, portable device. You can wear one while you do your normal daily activities. This is usually used to diagnose what is causing palpitations/syncope (passing out).  A Zio Patch Event Heart monitor will be applied to your chest today.  You will wear the patch for 14 days. After 24 hours, you may shower with the heart monitor on. If you feel any PALPITATIONS, you may press and release the button in the middle of the monitor. REMOVE ON 02/11/2019.   Follow-Up: At [facility], you and your health needs are our priority.  As part of our continuing mission to provide you with exceptional heart care, we have created designated Provider Care Teams.  These Care Teams include your primary Cardiologist (physician) and Advanced Practice Providers (APPs -  Physician Assistants and Nurse Practitioners) who all work together to provide you with the care you need, when you need it. You will need a follow up  appointment in 3 months.  Please call our office 2 months in advance to schedule this appointment.  You may see Kathlyn Sacramento, MD or one of the following Advanced Practice Providers on your designated Care Team:   Murray Hodgkins, NP Christell Faith, PA-C . Marrianne Mood, PA-C  Any Other Special Instructions Will Be Listed Below (If Applicable).  We will call you tomorrow with further instructions on your furosemide.

## 2019-01-29 ENCOUNTER — Telehealth: Payer: Self-pay

## 2019-01-29 DIAGNOSIS — I2510 Atherosclerotic heart disease of native coronary artery without angina pectoris: Secondary | ICD-10-CM

## 2019-01-29 LAB — BASIC METABOLIC PANEL
BUN/Creatinine Ratio: 18 (ref 9–23)
BUN: 29 mg/dL — ABNORMAL HIGH (ref 6–20)
CO2: 14 mmol/L — ABNORMAL LOW (ref 20–29)
Calcium: 9.3 mg/dL (ref 8.7–10.2)
Chloride: 104 mmol/L (ref 96–106)
Creatinine, Ser: 1.64 mg/dL — ABNORMAL HIGH (ref 0.57–1.00)
GFR calc Af Amer: 45 mL/min/{1.73_m2} — ABNORMAL LOW (ref >59)
GFR calc non Af Amer: 39 mL/min/{1.73_m2} — ABNORMAL LOW (ref >59)
Glucose: 194 mg/dL — ABNORMAL HIGH (ref 65–99)
Potassium: 5.1 mmol/L (ref 3.5–5.2)
Sodium: 138 mmol/L (ref 134–144)

## 2019-01-29 LAB — CBC WITH DIFFERENTIAL/PLATELET

## 2019-01-29 NOTE — Telephone Encounter (Signed)
-----   Message from Rise Mu, PA-C sent at 01/29/2019 11:27 AM EST ----- Renal function is stable, though slightly above her baseline. Potassium is high normal. Glucose is poorly controlled.  Please decrease Lasix to 20 mg every other day. She is not currently taking any supplemental KCl. She should follow up with a repeat BMP in 72 hours to ensure stable potassium on recently started Avapro by outside provider. Please ensure she is not eating seasonings high in potassium. If potassium continues to trend up on recently started ARB, this will need to be stopped.

## 2019-01-29 NOTE — Addendum Note (Signed)
Addended by: Verlon Au on: 01/29/2019 01:50 PM   Modules accepted: Orders

## 2019-01-29 NOTE — Telephone Encounter (Signed)
Orders placed for BMET. CBC added as well that was attempted in clinic yesterday but unable to obtain due to phlebotomy issues.

## 2019-01-29 NOTE — Telephone Encounter (Signed)
Call to patient to review lab results. She verbalized understanding and will take lasix 20 mg every other day. [facility].  We reviewed foods and spices high in potassium which she agrees to avoid.   Advised pt to call for any further questions or concerns.

## 2019-02-01 ENCOUNTER — Other Ambulatory Visit
Admission: RE | Admit: 2019-02-01 | Discharge: 2019-02-01 | Disposition: A | Discharge status: Home / Self Care | Payer: Medicare Other | Source: Ambulatory Visit | Attending: Physician Assistant | Admitting: Physician Assistant

## 2019-02-01 DIAGNOSIS — Z7982 Long term (current) use of aspirin: Secondary | ICD-10-CM | POA: Diagnosis not present

## 2019-02-01 DIAGNOSIS — Z79899 Other long term (current) drug therapy: Secondary | ICD-10-CM | POA: Diagnosis not present

## 2019-02-01 DIAGNOSIS — I2510 Atherosclerotic heart disease of native coronary artery without angina pectoris: Secondary | ICD-10-CM | POA: Diagnosis not present

## 2019-02-01 DIAGNOSIS — Z955 Presence of coronary angioplasty implant and graft: Secondary | ICD-10-CM | POA: Diagnosis not present

## 2019-02-01 DIAGNOSIS — Z7902 Long term (current) use of antithrombotics/antiplatelets: Secondary | ICD-10-CM | POA: Diagnosis not present

## 2019-02-01 DIAGNOSIS — I130 Hypertensive heart and chronic kidney disease with heart failure and stage 1 through stage 4 chronic kidney disease, or unspecified chronic kidney disease: Secondary | ICD-10-CM | POA: Diagnosis not present

## 2019-02-01 DIAGNOSIS — Z794 Long term (current) use of insulin: Secondary | ICD-10-CM | POA: Diagnosis not present

## 2019-02-01 LAB — BASIC METABOLIC PANEL
Anion gap: 9 (ref 5–15)
BUN: 31 mg/dL — ABNORMAL HIGH (ref 6–20)
CO2: 22 mmol/L (ref 22–32)
Calcium: 8.7 mg/dL — ABNORMAL LOW (ref 8.9–10.3)
Chloride: 108 mmol/L (ref 98–111)
Creatinine, Ser: 1.4 mg/dL — ABNORMAL HIGH (ref 0.44–1.00)
GFR calc Af Amer: 55 mL/min — ABNORMAL LOW (ref >60)
GFR calc non Af Amer: 47 mL/min — ABNORMAL LOW (ref >60)
Glucose, Bld: 163 mg/dL — ABNORMAL HIGH (ref 70–99)
Potassium: 4.7 mmol/L (ref 3.5–5.1)
Sodium: 139 mmol/L (ref 135–145)

## 2019-02-01 LAB — CBC
HCT: 35.4 % — ABNORMAL LOW (ref 36.0–46.0)
Hemoglobin: 10.7 g/dL — ABNORMAL LOW (ref 12.0–15.0)
MCH: 24.5 pg — ABNORMAL LOW (ref 26.0–34.0)
MCHC: 30.2 g/dL (ref 30.0–36.0)
MCV: 81 fL (ref 80.0–100.0)
Platelets: 441 10*3/uL — ABNORMAL HIGH (ref 150–400)
RBC: 4.37 MIL/uL (ref 3.87–5.11)
RDW: 15.9 % — ABNORMAL HIGH (ref 11.5–15.5)
WBC: 8.9 10*3/uL (ref 4.0–10.5)
nRBC: 0 % (ref 0.0–0.2)

## 2019-02-01 LAB — GLUCOSE, CAPILLARY
Glucose-Capillary: 303 mg/dL — ABNORMAL HIGH (ref 70–99)
Glucose-Capillary: 227 mg/dL — ABNORMAL HIGH (ref 70–99)

## 2019-02-01 NOTE — Progress Notes (Signed)
Daily Session Note  Patient Details  Name: [name] [name] MRN: [id number] Date of Birth: 1981/05/27 Referring Provider:     Cardiac Rehab from 01/21/2019 in [facility] Cardiac and Pulmonary Rehab  Referring Provider  End, Christiopher MD [Attending Cardiologist: Dr. Rogue Jury Arida]      Encounter Date: 02/01/2019  Check In: Session Check In - 02/01/19 [place]      Check-In   Supervising physician immediately available to respond to emergencies  See telemetry face sheet for immediately available ER MD    Location  [facility]-Cardiac & Pulmonary Rehab    Staff Present  Gerlene Burdock, RN, Moises Blood, BS, ACSM CEP, Exercise Physiologist;Milfred Krammes Oletta Darter, BA, ACSM CEP, Exercise Physiologist    Medication changes reported      No    Fall or balance concerns reported     No    Warm-up and Cool-down  Performed as group-led instruction    Resistance Training Performed  Yes    VAD Patient?  No    PAD/SET Patient?  No      Pain Assessment   Currently in Pain?  No/denies    Multiple Pain Sites  No          Social History   Tobacco Use  Smoking Status Former Smoker  . Years: 15.00  . Last attempt to quit: 03/10/2015  . Years since quitting: 3.9  Smokeless Tobacco Never Used    Goals Met:  Independence with exercise equipment Exercise tolerated well No report of cardiac concerns or symptoms Strength training completed today  Goals Unmet:  Not Applicable  Comments: First full day of exercise!  Patient was oriented to gym and equipment including functions, settings, policies, and procedures.  Patient's individual exercise prescription and treatment plan were reviewed.  All starting workloads were established based on the results of the 6 minute walk test done at initial orientation visit.  The plan for exercise progression was also introduced and progression will be customized based on patient's performance and goals.    Dr. Emily Filbert is Medical Director for [facility].

## 2019-02-03 ENCOUNTER — Encounter: Payer: Self-pay | Admitting: *Deleted

## 2019-02-03 DIAGNOSIS — Z955 Presence of coronary angioplasty implant and graft: Secondary | ICD-10-CM

## 2019-02-03 NOTE — Progress Notes (Signed)
Cardiac Individual Treatment Plan  Patient Details  Name: [name] [name] MRN: [id number] Date of Birth: 1981-04-11 Referring Provider:     Cardiac Rehab from 01/21/2019 in [facility] Cardiac and Pulmonary Rehab  Referring Provider  End, Christiopher MD [Attending Cardiologist: Dr. Rogue Jury Arida]      Initial Encounter Date:    Cardiac Rehab from 01/21/2019 in [facility] Cardiac and Pulmonary Rehab  Date  01/21/19      Visit Diagnosis: Status post coronary artery stent placement  Patient's Home Medications on Admission:  Current Outpatient Medications:  .  aspirin EC 81 MG tablet, Take 81 mg by mouth daily., Disp: , Rfl:  .  benzonatate (TESSALON) 100 MG capsule, Take 1 capsule (100 mg total) by mouth 2 (two) times daily as needed for cough., Disp: 30 capsule, Rfl: 12 .  Cholecalciferol 2000 units TABS, Take 2,000 Units by mouth daily. , Disp: , Rfl:  .  clopidogrel (PLAVIX) 75 MG tablet, Take 4 tabs at 7pm tonight (12/28), then 1 tab daily beginning 12/29. (Patient taking differently: Take 75 mg by mouth daily. ), Disp: 90 tablet, Rfl: 3 .  DULoxetine (CYMBALTA) 30 MG capsule, Take 60 mg by mouth daily. , Disp: , Rfl: 3 .  etonogestrel (NEXPLANON) 68 MG IMPL implant, 1 each by Subdermal route once., Disp: , Rfl:  .  FARXIGA 10 MG TABS tablet, Take 10 mg by mouth daily. , Disp: , Rfl: 11 .  ferrous sulfate 325 (65 FE) MG tablet, Take 1 tablet (325 mg total) by mouth daily., Disp: , Rfl:  .  furosemide (LASIX) 40 MG tablet, Take 1 tablet (40 mg total) by mouth every other day., Disp: 30 tablet, Rfl: 5 .  glimepiride (AMARYL) 4 MG tablet, TAKE 1 TABLET BY MOUTH ONCE DAILY (Patient taking differently: Take 4 mg by mouth daily. ), Disp: 90 tablet, Rfl: 1 .  glucose blood (ACCU-CHEK ACTIVE STRIPS) test strip, Use as instructed, Disp: 100 each, Rfl: 12 .  glucose blood (ACCU-CHEK AVIVA) test strip, 1 each by Other route as needed for other. Use as instructed, Disp: , Rfl:  .  insulin aspart (NOVOLOG)  100 UNIT/ML injection, Inject 10 Units into the skin 3 (three) times daily with meals. (Patient taking differently: Inject 6-20 Units into the skin 3 (three) times daily with meals. ), Disp: 10 mL, Rfl: 11 .  insulin glargine (LANTUS) 100 UNIT/ML injection, Inject 0.4 mLs (40 Units total) into the skin at bedtime. (Patient taking differently: Inject 60 Units into the skin at bedtime. ), Disp: 10 mL, Rfl: 11 .  Insulin Pen Needle 32G X 4 MM MISC, 1 Units by Does not apply route every morning. Pen needles, Disp: 90 each, Rfl: 3 .  irbesartan (AVAPRO) 150 MG tablet, Take 150 mg by mouth daily., Disp: , Rfl:  .  isosorbide mononitrate (IMDUR) 30 MG 24 hr tablet, Take 2 tablets (60 mg total) by mouth daily., Disp: 90 tablet, Rfl: 3 .  loperamide (IMODIUM A-D) 2 MG tablet, Take 8-12 mg by mouth daily as needed for diarrhea or loose stools., Disp: , Rfl:  .  meclizine (ANTIVERT) 25 MG tablet, Take 25 mg by mouth 3 (three) times daily as needed for dizziness., Disp: , Rfl:  .  metoprolol tartrate (LOPRESSOR) 50 MG tablet, Take 1 tablet (50 mg total) by mouth 2 (two) times daily., Disp: 180 tablet, Rfl: 3 .  mirtazapine (REMERON) 30 MG tablet, Take 30 mg by mouth daily. , Disp: , Rfl: 3 .  nitroGLYCERIN (  NITROSTAT) 0.4 MG SL tablet, Take 1 tab under your tongue while sitting for chest pain, If no relief may repeat, one tab every 5 min up to 3 tabs total over 15 mins., Disp: 25 tablet, Rfl: 3 .  pantoprazole (PROTONIX) 40 MG tablet, TAKE 1 TABLET BY MOUTH ONCE DAILY AT 6AM (Patient taking differently: Take 40 mg by mouth 2 (two) times daily. ), Disp: 90 tablet, Rfl: 1  Past Medical History: Past Medical History:  Diagnosis Date  . CAD (coronary artery disease)    a. 03/2015 NSTEMI/PCI: LCX 119m(DES), OM1 100p (DES - vessel labeled Ramus in subsequent cath report). Minor irregs to LAD/RCA; b. 04/2015 Cath: LM nl, LAD 40p, RI patent stent, LCX patent stent, RCA nl, EF 55-65%; c. 02/2016 MV: basal inferolateral and  mid inferolateral defect/infarct w/o ischemia. EF 55-65%; d. 11/2018 PCI: LM 30ost, LAD 30p, LCX 95p/m (PTCA->20%), OM1 10ost, OM2 40ost, RCA 85p.  .Marland KitchenChronic diastolic (congestive) heart failure ([place]    a. 03/2015 Echo: EF 30-35%, mild concentric LVH, severe HK of inf, inflat, & lat walls, mod MR, mild TR;  b. 04/2015 LV Gram: EF 55-65%; c. 09/2017 Echo: EF 55-60%, no rwma. Mild MR; d. 11/2018 Echo: Ef 60-65%, no rwma.  . CKD (chronic kidney disease), stage III ([place]   . Diabetes type 2, controlled ([place]    a. since 2002   . Diabetic gastroparesis (HCC)    a. chronic nausea/vomiting.  . Diverticulosis, sigmoid 07/2016  . Heavy menses    a. H/O IUD - expired in 2014 - remains in place.  . Hyperlipidemia   . Hypertension   . Iron deficiency anemia   . Ischemic cardiomyopathy (resolved)    a. 03/2015 EF 30-35% post NSTEMI;  b. 04/2015 EF 55-65% on LV gram; c. 09/2017 Echo: EF 55-60%; d. 11/2018 Echo: Ef 60-65%.  . Obesity   . Tobacco abuse    a. quit 03/2015.  .Marland KitchenUterine fibroid   . Vertigo   . Vitamin D deficiency     Tobacco Use: Social History   Tobacco Use  Smoking Status Former Smoker  . Years: 15.00  . Last attempt to quit: 03/10/2015  . Years since quitting: 3.9  Smokeless Tobacco Never Used    Labs: Recent Review Flowsheet Data    Labs for ITP Cardiac and Pulmonary Rehab Latest Ref Rng & Units 10/27/2017 01/09/2018 01/22/2018 10/06/2018 12/17/2018   Cholestrol 100 - 199 mg/dL - - - 236(H) 245(H)   LDLCALC 0 - 99 mg/dL - - - 163(H) 165(H)   LDLDIRECT 0 - 99 mg/dL - - - - 163(H)   HDL >39 mg/dL - - - 39(L) 53   Trlycerides 0 - 149 mg/dL - - - 171(H) 137   Hemoglobin A1c 4.8 - 5.6 % 11.1(H) 9.6(H) 11.5(H) - -       Exercise Target Goals: Exercise Program Goal: Individual exercise prescription set using results from initial 6 min walk test and THRR while considering  patient's activity barriers and safety.   Exercise Prescription Goal: Initial exercise prescription builds to  30-45 minutes a day of aerobic activity, 2-3 days per week.  Home exercise guidelines will be given to patient during program as part of exercise prescription that the participant will acknowledge.  Activity Barriers & Risk Stratification: Activity Barriers & Cardiac Risk Stratification - 01/21/19 1355      Activity Barriers & Cardiac Risk Stratification   Activity Barriers  Deconditioning;Muscular Weakness;Shortness of Breath;Balance Concerns;Back Problems;Joint Problems;Decreased Ventricular  Function   knees and back can be very painful (chronic), occasional dizzy spells   Cardiac Risk Stratification  High       6 Minute Walk: 6 Minute Walk    Row Name 01/21/19 1354         6 Minute Walk   Phase  Initial     Distance  850 feet     Walk Time  6 minutes     MPH  1.61     METS  3.5     RPE  12     Perceived Dyspnea   1     VO2 Peak  12.25     Symptoms  Yes (comment)     Comments  legs tired and painful 9/10     Resting HR  103 bpm     Resting BP  126/74     Resting Oxygen Saturation   96 %     Exercise Oxygen Saturation  during 6 min walk  97 %     Max Ex. HR  135 bpm     Max Ex. BP  156/74     2 Minute Post BP  130/70        Oxygen Initial Assessment:   Oxygen Re-Evaluation:   Oxygen Discharge (Final Oxygen Re-Evaluation):   Initial Exercise Prescription: Initial Exercise Prescription - 01/21/19 1300      Date of Initial Exercise RX and Referring Provider   Date  01/21/19    Referring Provider  End, Christiopher MD   Attending Cardiologist: Dr. Kathlyn [place]     Treadmill   MPH  1.4    Grade  0.5    Minutes  15    METs  2.17      REL-XR   Level  1    Speed  50    Minutes  15    METs  2.5      T5 Nustep   Level  2    SPM  80    Minutes  15    METs  2.5      Prescription Details   Frequency (times per week)  3    Duration  Progress to 45 minutes of aerobic exercise without signs/symptoms of physical distress      Intensity   THRR 40-80% of  Max Heartrate  135-166    Ratings of Perceived Exertion  11-13    Perceived Dyspnea  0-4      Progression   Progression  Continue to progress workloads to maintain intensity without signs/symptoms of physical distress.      Resistance Training   Training Prescription  Yes    Weight  3 lbs    Reps  10-15       Perform Capillary Blood Glucose checks as needed.  Exercise Prescription Changes: Exercise Prescription Changes    Row Name 01/21/19 1300             Response to Exercise   Blood Pressure (Admit)  126/74       Blood Pressure (Exercise)  156/74       Blood Pressure (Exit)  130/70       Heart Rate (Admit)  103 bpm       Heart Rate (Exercise)  135 bpm       Heart Rate (Exit)  108 bpm       Oxygen Saturation (Admit)  96 %       Oxygen Saturation (Exercise)  97 %  Rating of Perceived Exertion (Exercise)  12       Perceived Dyspnea (Exercise)  1       Symptoms  legs tired and pain 9/10, SOB       Comments  walk test results          Exercise Comments: Exercise Comments    Row Name 02/01/19 1737           Exercise Comments   First full day of exercise!  Patient was oriented to gym and equipment including functions, settings, policies, and procedures.  Patient's individual exercise prescription and treatment plan were reviewed.  All starting workloads were established based on the results of the 6 minute walk test done at initial orientation visit.  The plan for exercise progression was also introduced and progression will be customized based on patient's performance and goals.          Exercise Goals and Review: Exercise Goals    Row Name 01/21/19 1406             Exercise Goals   Increase Physical Activity  Yes       Intervention  Provide advice, education, support and counseling about physical activity/exercise needs.;Develop an individualized exercise prescription for aerobic and resistive training based on initial evaluation findings, risk  stratification, comorbidities and participant's personal goals.       Expected Outcomes  Short Term: Attend rehab on a regular basis to increase amount of physical activity.;Long Term: Exercising regularly at least 3-5 days a week.;Long Term: Add in home exercise to make exercise part of routine and to increase amount of physical activity.       Increase Strength and Stamina  Yes       Intervention  Provide advice, education, support and counseling about physical activity/exercise needs.;Develop an individualized exercise prescription for aerobic and resistive training based on initial evaluation findings, risk stratification, comorbidities and participant's personal goals.       Expected Outcomes  Short Term: Increase workloads from initial exercise prescription for resistance, speed, and METs.;Short Term: Perform resistance training exercises routinely during rehab and add in resistance training at home;Long Term: Improve cardiorespiratory fitness, muscular endurance and strength as measured by increased METs and functional capacity (6MWT)       Able to understand and use rate of perceived exertion (RPE) scale  Yes       Intervention  Provide education and explanation on how to use RPE scale       Expected Outcomes  Short Term: Able to use RPE daily in rehab to express subjective intensity level;Long Term:  Able to use RPE to guide intensity level when exercising independently       Knowledge and understanding of Target Heart Rate Range (THRR)  Yes       Intervention  Provide education and explanation of THRR including how the numbers were predicted and where they are located for reference       Expected Outcomes  Short Term: Able to state/look up THRR;Short Term: Able to use daily as guideline for intensity in rehab;Long Term: Able to use THRR to govern intensity when exercising independently       Able to check pulse independently  Yes       Intervention  Provide education and demonstration on how to  check pulse in carotid and radial arteries.;Review the importance of being able to check your own pulse for safety during independent exercise       Expected Outcomes  Short Term: Able to explain why pulse checking is important during independent exercise;Long Term: Able to check pulse independently and accurately       Understanding of Exercise Prescription  Yes       Intervention  Provide education, explanation, and written materials on patient's individual exercise prescription       Expected Outcomes  Short Term: Able to explain program exercise prescription;Long Term: Able to explain home exercise prescription to exercise independently          Exercise Goals Re-Evaluation : Exercise Goals Re-Evaluation    Row Name 02/01/19 1737             Exercise Goal Re-Evaluation   Exercise Goals Review  Increase Physical Activity;Increase Strength and Stamina;Able to understand and use rate of perceived exertion (RPE) scale;Able to understand and use Dyspnea scale       Comments  Reviewed RPE scale, THR and program prescription with pt today.  Pt voiced understanding and was given a copy of goals to take home.        Expected Outcomes  Short: Use RPE daily to regulate intensity. Long: Follow program prescription in THR.          Discharge Exercise Prescription (Final Exercise Prescription Changes): Exercise Prescription Changes - 01/21/19 1300      Response to Exercise   Blood Pressure (Admit)  126/74    Blood Pressure (Exercise)  156/74    Blood Pressure (Exit)  130/70    Heart Rate (Admit)  103 bpm    Heart Rate (Exercise)  135 bpm    Heart Rate (Exit)  108 bpm    Oxygen Saturation (Admit)  96 %    Oxygen Saturation (Exercise)  97 %    Rating of Perceived Exertion (Exercise)  12    Perceived Dyspnea (Exercise)  1    Symptoms  legs tired and pain 9/10, SOB    Comments  walk test results       Nutrition:  Target Goals: Understanding of nutrition guidelines, daily intake of sodium  '1500mg'$ , cholesterol '200mg'$ , calories 30% from fat and 7% or less from saturated fats, daily to have 5 or more servings of fruits and vegetables.  Biometrics: Pre Biometrics - 01/21/19 1407      Pre Biometrics   Height  5' 6.4" (1.687 m)    Weight  275 lb 12.8 oz (125.1 kg)    Waist Circumference  50 inches    Hip Circumference  52 inches    Waist to Hip Ratio  0.96 %    BMI (Calculated)  43.96    Single Leg Stand  4.58 seconds        Nutrition Therapy Plan and Nutrition Goals: Nutrition Therapy & Goals - 01/21/19 1429      Intervention Plan   Intervention  Prescribe, educate and counsel regarding individualized specific dietary modifications aiming towards targeted core components such as weight, hypertension, lipid management, diabetes, heart failure and other comorbidities.;Nutrition handout(s) given to patient.    Expected Outcomes  Short Term Goal: Understand basic principles of dietary content, such as calories, fat, sodium, cholesterol and nutrients.;Short Term Goal: A plan has been developed with personal nutrition goals set during dietitian appointment.;Long Term Goal: Adherence to prescribed nutrition plan.       Nutrition Assessments: Nutrition Assessments - 01/21/19 1429      MEDFICTS Scores   Pre Score  24       Nutrition Goals Re-Evaluation:   Nutrition Goals Discharge (  Final Nutrition Goals Re-Evaluation):   Psychosocial: Target Goals: Acknowledge presence or absence of significant depression and/or stress, maximize coping skills, provide positive support system. Participant is able to verbalize types and ability to use techniques and skills needed for reducing stress and depression.   Initial Review & Psychosocial Screening: Initial Psych Review & Screening - 01/21/19 1426      Initial Review   Current issues with  Current Depression;Current Anxiety/Panic;Current Stress Concerns    Source of Stress Concerns  Chronic Illness;Unable to perform  yard/household activities;Unable to participate in former interests or hobbies;Family    Comments  [name] [name] reports her quality of life is low due to inability to participate in some daily activities and hobbies related to her lack of stamina. She said her husband, who is a Administrator, and her 40 year old daughter are very supportive. She wants to work on her energy level while here      [place]?  Yes   husband, daughter, family     Barriers   Psychosocial barriers to participate in program  There are no identifiable barriers or psychosocial needs.;The patient should benefit from training in stress management and relaxation.      Screening Interventions   Interventions  Encouraged to exercise;Program counselor consult;To provide support and resources with identified psychosocial needs;Provide feedback about the scores to participant    Expected Outcomes  Short Term goal: Utilizing psychosocial counselor, staff and physician to assist with identification of specific Stressors or current issues interfering with healing process. Setting desired goal for each stressor or current issue identified.;Long Term Goal: Stressors or current issues are controlled or eliminated.;Short Term goal: Identification and review with participant of any Quality of Life or Depression concerns found by scoring the questionnaire.;Long Term goal: The participant improves quality of Life and PHQ9 Scores as seen by post scores and/or verbalization of changes       Quality of Life Scores:  Quality of Life - 01/21/19 1428      Quality of Life   Select  Quality of Life      Quality of Life Scores   Health/Function Pre  3.2 %    Socioeconomic Pre  15.43 %    Psych/Spiritual Pre  3.43 %    Family Pre  20.4 %    GLOBAL Pre  8.23 %      Scores of 19 and below usually indicate a poorer quality of life in these areas.  A difference of  2-3 points is a clinically meaningful difference.  A  difference of 2-3 points in the total score of the Quality of Life Index has been associated with significant improvement in overall quality of life, self-image, physical symptoms, and general health in studies assessing change in quality of life.  PHQ-9: Recent Review Flowsheet Data    Depression screen [facility] 2/9 01/21/2019 10/06/2018 01/09/2018 08/18/2017 08/12/2017   Decreased Interest 3 0 0 1 2   Down, Depressed, Hopeless '2 1 3 1 2   '$ PHQ - 2 Score '5 1 3 2 4   '$ Altered sleeping '3 1 3 '$ - 3   Tired, decreased energy '3 1 3 '$ - 2   Change in appetite '3 1 3 '$ - 2   Feeling bad or failure about yourself  '3 1 2 '$ - 2   Trouble concentrating '1 1 2 '$ - 2   Moving slowly or fidgety/restless 2 0 0 - 0   Suicidal thoughts 0 0 2 0  0   PHQ-9 Score '20 6 18 '$ - 15   Difficult doing work/chores Extremely dIfficult Very difficult - - -     Interpretation of Total Score  Total Score Depression Severity:  1-4 = Minimal depression, 5-9 = Mild depression, 10-14 = Moderate depression, 15-19 = Moderately severe depression, 20-27 = Severe depression   Psychosocial Evaluation and Intervention:   Psychosocial Re-Evaluation:   Psychosocial Discharge (Final Psychosocial Re-Evaluation):   Vocational Rehabilitation: Provide vocational rehab assistance to qualifying candidates.   Vocational Rehab Evaluation & Intervention: Vocational Rehab - 01/21/19 1426      Initial Vocational Rehab Evaluation & Intervention   Assessment shows need for Vocational Rehabilitation  No       Education: Education Goals: Education classes will be provided on a variety of topics geared toward better understanding of heart health and risk factor modification. Participant will state understanding/return demonstration of topics presented as noted by education test scores.  Learning Barriers/Preferences: Learning Barriers/Preferences - 01/21/19 1425      Learning Barriers/Preferences   Learning Barriers  None    Learning Preferences  None        Education Topics:  AED/CPR: - Group verbal and written instruction with the use of models to demonstrate the basic use of the AED with the basic ABC's of resuscitation.   Cardiac Rehab from 02/01/2019 in [facility] Cardiac and Pulmonary Rehab  Date  02/01/19  Educator  CE  Instruction Review Code  1- Verbalizes Understanding      General Nutrition Guidelines/Fats and Fiber: -Group instruction provided by verbal, written material, models and posters to present the general guidelines for heart healthy nutrition. Gives an explanation and review of dietary fats and fiber.   Controlling Sodium/Reading Food Labels: -Group verbal and written material supporting the discussion of sodium use in heart healthy nutrition. Review and explanation with models, verbal and written materials for utilization of the food label.   Cardiac Rehab from 08/02/2015 in [facility] Cardiac and Pulmonary Rehab  Date  07/31/15  Educator  PI  Instruction Review Code (retired)  2- meets goals/outcomes      Exercise Physiology & General Exercise Guidelines: - Group verbal and written instruction with models to review the exercise physiology of the cardiovascular system and associated critical values. Provides general exercise guidelines with specific guidelines to those with heart or lung disease.    Aerobic Exercise & Resistance Training: - Gives group verbal and written instruction on the various components of exercise. Focuses on aerobic and resistive training programs and the benefits of this training and how to safely progress through these programs..   Flexibility, Balance, Mind/Body Relaxation: Provides group verbal/written instruction on the benefits of flexibility and balance training, including mind/body exercise modes such as yoga, pilates and tai chi.  Demonstration and skill practice provided.   Stress and Anxiety: - Provides group verbal and written instruction about the health risks of elevated stress  and causes of high stress.  Discuss the correlation between heart/lung disease and anxiety and treatment options. Review healthy ways to manage with stress and anxiety.   Depression: - Provides group verbal and written instruction on the correlation between heart/lung disease and depressed mood, treatment options, and the stigmas associated with seeking treatment.   Cardiac Rehab from 08/02/2015 in [facility] Cardiac and Pulmonary Rehab  Date  08/02/15  Educator  [facility]  Instruction Review Code (retired)  2- meets Designer, fashion/clothing & Physiology of the Heart: - Group verbal and written  instruction and models provide basic cardiac anatomy and physiology, with the coronary electrical and arterial systems. Review of Valvular disease and Heart Failure   Cardiac Procedures: - Group verbal and written instruction to review commonly prescribed medications for heart disease. Reviews the medication, class of the drug, and side effects. Includes the steps to properly store meds and maintain the prescription regimen. (beta blockers and nitrates)   Cardiac Medications I: - Group verbal and written instruction to review commonly prescribed medications for heart disease. Reviews the medication, class of the drug, and side effects. Includes the steps to properly store meds and maintain the prescription regimen.   Cardiac Medications II: -Group verbal and written instruction to review commonly prescribed medications for heart disease. Reviews the medication, class of the drug, and side effects. (all other drug classes)    Go Sex-Intimacy & Heart Disease, Get SMART - Goal Setting: - Group verbal and written instruction through game format to discuss heart disease and the return to sexual intimacy. Provides group verbal and written material to discuss and apply goal setting through the application of the S.M.A.R.T. Method.   Other Matters of the Heart: - Provides group verbal, written materials and  models to describe Stable Angina and Peripheral Artery. Includes description of the disease process and treatment options available to the cardiac patient.   Exercise & Equipment Safety: - Individual verbal instruction and demonstration of equipment use and safety with use of the equipment.   Cardiac Rehab from 02/01/2019 in [facility] Cardiac and Pulmonary Rehab  Date  01/21/19  Educator  [facility]  Instruction Review Code  1- Verbalizes Understanding      Infection Prevention: - Provides verbal and written material to individual with discussion of infection control including proper hand washing and proper equipment cleaning during exercise session.   Cardiac Rehab from 02/01/2019 in [facility] [facility]  Date  01/21/19  Educator  [facility]  Instruction Review Code  1- Verbalizes Understanding      Falls Prevention: - Provides verbal and written material to individual with discussion of falls prevention and safety.   Cardiac Rehab from 02/01/2019 in [facility] Cardiac and Pulmonary Rehab  Date  01/21/19  Educator  [facility]  Instruction Review Code  1- Verbalizes Understanding      Diabetes: - Individual verbal and written instruction to review signs/symptoms of diabetes, desired ranges of glucose level fasting, after meals and with exercise. Acknowledge that pre and post exercise glucose checks will be done for 3 sessions at entry of program.   Cardiac Rehab from 02/01/2019 in [facility] Cardiac and Pulmonary Rehab  Date  01/21/19  Educator  [facility]  Instruction Review Code  1- Verbalizes Understanding      Know Your Numbers and Risk Factors: -Group verbal and written instruction about important numbers in your health.  Discussion of what are risk factors and how they play a role in the disease process.  Review of Cholesterol, Blood Pressure, Diabetes, and BMI and the role they play in your overall health.   Sleep Hygiene: -Provides group verbal and written instruction about how sleep can affect your  health.  Define sleep hygiene, discuss sleep cycles and impact of sleep habits. Review good sleep hygiene tips.    Other: -Provides group and verbal instruction on various topics (see comments)   Knowledge Questionnaire Score: Knowledge Questionnaire Score - 01/21/19 1425      Knowledge Questionnaire Score   Pre Score  24/26   correct answers reviewed with [place]. Focus  on PAD and Nutrition       Core Components/Risk Factors/Patient Goals at Admission: Personal Goals and Risk Factors at Admission - 01/21/19 1424      Core Components/Risk Factors/Patient Goals on Admission    Weight Management  Yes;Obesity;Weight Loss    Intervention  Weight Management: Develop a combined nutrition and exercise program designed to reach desired caloric intake, while maintaining appropriate intake of nutrient and fiber, sodium and fats, and appropriate energy expenditure required for the weight goal.;Weight Management: Provide education and appropriate resources to help participant work on and attain dietary goals.;Weight Management/Obesity: Establish reasonable short term and long term weight goals.;Obesity: Provide education and appropriate resources to help participant work on and attain dietary goals.    Admit Weight  275 lb 12.8 oz (125.1 kg)    Goal Weight: Short Term  270 lb (122.5 kg)    Goal Weight: Long Term  140 lb (63.5 kg)    Expected Outcomes  Short Term: Continue to assess and modify interventions until short term weight is achieved;Long Term: Adherence to nutrition and physical activity/exercise program aimed toward attainment of established weight goal;Weight Loss: Understanding of general recommendations for a balanced deficit meal plan, which promotes 1-2 lb weight loss per week and includes a negative energy balance of [phone] kcal/d;Understanding recommendations for meals to include 15-35% energy as protein, 25-35% energy from fat, 35-60% energy from carbohydrates, less than '200mg'$  of  dietary cholesterol, 20-35 gm of total fiber daily;Understanding of distribution of calorie intake throughout the day with the consumption of 4-5 meals/snacks    Diabetes  Yes    Intervention  Provide education about signs/symptoms and action to take for hypo/hyperglycemia.;Provide education about proper nutrition, including hydration, and aerobic/resistive exercise prescription along with prescribed medications to achieve blood glucose in normal ranges: Fasting glucose 65-99 mg/dL    Expected Outcomes  Short Term: Participant verbalizes understanding of the signs/symptoms and immediate care of hyper/hypoglycemia, proper foot care and importance of medication, aerobic/resistive exercise and nutrition plan for blood glucose control.;Long Term: Attainment of HbA1C < 7%.    Heart Failure  Yes    Intervention  Provide a combined exercise and nutrition program that is supplemented with education, support and counseling about heart failure. Directed toward relieving symptoms such as shortness of breath, decreased exercise tolerance, and extremity edema.    Expected Outcomes  Improve functional capacity of life;Short term: Attendance in program 2-3 days a week with increased exercise capacity. Reported lower sodium intake. Reported increased fruit and vegetable intake. Reports medication compliance.;Short term: Daily weights obtained and reported for increase. Utilizing diuretic protocols set by physician.;Long term: Adoption of self-care skills and reduction of barriers for early signs and symptoms recognition and intervention leading to self-care maintenance.    Hypertension  Yes    Intervention  Provide education on lifestyle modifcations including regular physical activity/exercise, weight management, moderate sodium restriction and increased consumption of fresh fruit, vegetables, and low fat dairy, alcohol moderation, and smoking cessation.;Monitor prescription use compliance.    Expected Outcomes  Short  Term: Continued assessment and intervention until BP is < 140/34m HG in hypertensive participants. < 130/866mHG in hypertensive participants with diabetes, heart failure or chronic kidney disease.;Long Term: Maintenance of blood pressure at goal levels.    Lipids  Yes    Intervention  Provide education and support for participant on nutrition & aerobic/resistive exercise along with prescribed medications to achieve LDL '70mg'$ , HDL >'40mg'$ .    Expected Outcomes  Short Term: Participant states  understanding of desired cholesterol values and is compliant with medications prescribed. Participant is following exercise prescription and nutrition guidelines.;Long Term: Cholesterol controlled with medications as prescribed, with individualized exercise RX and with personalized nutrition plan. Value goals: LDL < '70mg'$ , HDL > 40 mg.       Core Components/Risk Factors/Patient Goals Review:    Core Components/Risk Factors/Patient Goals at Discharge (Final Review):    ITP Comments: ITP Comments    Row Name 02/03/19 0620           ITP Comments  30 day review. Continue with ITP unless directed changes by Medical Director chart review. New to program          Comments:

## 2019-02-04 DIAGNOSIS — Z794 Long term (current) use of insulin: Secondary | ICD-10-CM | POA: Diagnosis not present

## 2019-02-04 DIAGNOSIS — Z7982 Long term (current) use of aspirin: Secondary | ICD-10-CM | POA: Diagnosis not present

## 2019-02-04 DIAGNOSIS — Z79899 Other long term (current) drug therapy: Secondary | ICD-10-CM | POA: Diagnosis not present

## 2019-02-04 DIAGNOSIS — Z955 Presence of coronary angioplasty implant and graft: Secondary | ICD-10-CM

## 2019-02-04 DIAGNOSIS — Z7902 Long term (current) use of antithrombotics/antiplatelets: Secondary | ICD-10-CM | POA: Diagnosis not present

## 2019-02-04 DIAGNOSIS — I130 Hypertensive heart and chronic kidney disease with heart failure and stage 1 through stage 4 chronic kidney disease, or unspecified chronic kidney disease: Secondary | ICD-10-CM | POA: Diagnosis not present

## 2019-02-04 LAB — GLUCOSE, CAPILLARY
Glucose-Capillary: 149 mg/dL — ABNORMAL HIGH (ref 70–99)
Glucose-Capillary: 96 mg/dL (ref 70–99)

## 2019-02-04 NOTE — Progress Notes (Signed)
Daily Session Note  Patient Details  Name: [name] [name] MRN: [id number] Date of Birth: [birth year] Referring Provider:     Cardiac Rehab from 01/21/2019 in [facility] Cardiac and Pulmonary Rehab  Referring Provider  End, Christiopher MD [Attending Cardiologist: Dr. Rogue Jury Arida]      Encounter Date: 02/04/2019  Check In: Session Check In - 02/04/19 1701      Check-In   Supervising physician immediately available to respond to emergencies  See telemetry face sheet for immediately available ER MD    Location  [facility]-Cardiac & Pulmonary Rehab    Staff Present  Justin Mend Jaci Carrel, BS, ACSM CEP, Exercise Physiologist;Meredith Sherryll Burger, RN BSN    Medication changes reported      No    Fall or balance concerns reported     No    Warm-up and Cool-down  Performed as group-led Higher education careers adviser Performed  Yes    VAD Patient?  No      Pain Assessment   Currently in Pain?  No/denies          Social History   Tobacco Use  Smoking Status Former Smoker  . Years: 15.00  . Last attempt to quit: 03/10/2015  . Years since quitting: 3.9  Smokeless Tobacco Never Used    Goals Met:  Independence with exercise equipment Exercise tolerated well No report of cardiac concerns or symptoms Strength training completed today  Goals Unmet:  Not Applicable  Comments: Pt able to follow exercise prescription today without complaint.  Will continue to monitor for progression.    Dr. Emily Filbert is Medical Director for [place] and [facility].

## 2019-02-08 ENCOUNTER — Encounter: Payer: Medicare Other | Attending: Cardiovascular Disease

## 2019-02-08 DIAGNOSIS — K3184 Gastroparesis: Secondary | ICD-10-CM | POA: Insufficient documentation

## 2019-02-08 DIAGNOSIS — D509 Iron deficiency anemia, unspecified: Secondary | ICD-10-CM | POA: Insufficient documentation

## 2019-02-08 DIAGNOSIS — Z87891 Personal history of nicotine dependence: Secondary | ICD-10-CM | POA: Insufficient documentation

## 2019-02-08 DIAGNOSIS — I130 Hypertensive heart and chronic kidney disease with heart failure and stage 1 through stage 4 chronic kidney disease, or unspecified chronic kidney disease: Secondary | ICD-10-CM | POA: Insufficient documentation

## 2019-02-08 DIAGNOSIS — E669 Obesity, unspecified: Secondary | ICD-10-CM | POA: Insufficient documentation

## 2019-02-08 DIAGNOSIS — I2510 Atherosclerotic heart disease of native coronary artery without angina pectoris: Secondary | ICD-10-CM | POA: Insufficient documentation

## 2019-02-08 DIAGNOSIS — Z955 Presence of coronary angioplasty implant and graft: Secondary | ICD-10-CM | POA: Insufficient documentation

## 2019-02-08 DIAGNOSIS — E1122 Type 2 diabetes mellitus with diabetic chronic kidney disease: Secondary | ICD-10-CM | POA: Insufficient documentation

## 2019-02-08 DIAGNOSIS — N183 Chronic kidney disease, stage 3 (moderate): Secondary | ICD-10-CM | POA: Insufficient documentation

## 2019-02-08 DIAGNOSIS — I5032 Chronic diastolic (congestive) heart failure: Secondary | ICD-10-CM | POA: Insufficient documentation

## 2019-02-08 DIAGNOSIS — E1143 Type 2 diabetes mellitus with diabetic autonomic (poly)neuropathy: Secondary | ICD-10-CM | POA: Insufficient documentation

## 2019-02-08 DIAGNOSIS — Z79899 Other long term (current) drug therapy: Secondary | ICD-10-CM | POA: Insufficient documentation

## 2019-02-08 DIAGNOSIS — Z7982 Long term (current) use of aspirin: Secondary | ICD-10-CM | POA: Insufficient documentation

## 2019-02-08 DIAGNOSIS — Z7902 Long term (current) use of antithrombotics/antiplatelets: Secondary | ICD-10-CM | POA: Insufficient documentation

## 2019-02-08 DIAGNOSIS — Z794 Long term (current) use of insulin: Secondary | ICD-10-CM | POA: Insufficient documentation

## 2019-02-11 ENCOUNTER — Telehealth: Payer: Self-pay

## 2019-02-11 NOTE — Telephone Encounter (Signed)
Saydi has been having dizzy spells.  It is worse with movement.  She has been wearing a holter monitor and it comes off tomorrow.  She has spoken with her Dr about the dizziness.

## 2019-02-12 ENCOUNTER — Other Ambulatory Visit: Payer: Self-pay | Admitting: *Deleted

## 2019-02-12 DIAGNOSIS — D649 Anemia, unspecified: Secondary | ICD-10-CM

## 2019-02-12 NOTE — Progress Notes (Signed)
c 

## 2019-02-14 NOTE — Progress Notes (Signed)
[place]  [phone] [phone]  ID: [name] OB: Sep 05, 1981  MR#: [id number]  OJJ#:[id number]  Patient Care Team: Venita Lick, NP as PCP - General (Nurse Practitioner) Wellington Hampshire, MD as PCP - Cardiology (Cardiology) Wellington Hampshire, MD as Consulting Physician (Cardiology) Alisa Graff, FNP as Nurse Practitioner (Family Medicine)  CHIEF COMPLAINT: Iron deficiency anemia.  INTERVAL HISTORY: Patient returns to clinic today for repeat laboratory work, further evaluation, and initiation of IV Feraheme.  She continues to have chronic weakness and fatigue, but otherwise feels well.  She has no neurologic complaints.  She denies any recent fevers or illnesses.  She has a good appetite and denies weight loss.  She has no chest pain or shortness of breath.  She denies any nausea, vomiting, constipation, or diarrhea.  She denies any melena or hematochezia.  She has no urinary complaints.  Patient offers no further specific complaints today.  REVIEW OF SYSTEMS:   Review of Systems  Constitutional: Positive for malaise/fatigue. Negative for fever and weight loss.  Respiratory: Negative.  Negative for cough, hemoptysis and shortness of breath.   Cardiovascular: Negative.  Negative for chest pain and leg swelling.  Gastrointestinal: Negative.  Negative for abdominal pain, blood in stool and melena.  Genitourinary: Negative.  Negative for hematuria.  Musculoskeletal: Negative.  Negative for back pain.  Skin: Negative.  Negative for rash.  Neurological: Positive for weakness. Negative for dizziness, focal weakness and headaches.  Psychiatric/Behavioral: Negative.  The patient is not nervous/anxious.     As per HPI. Otherwise, a complete review of systems is negative.  PAST MEDICAL HISTORY: Past Medical History:  Diagnosis Date  . CAD (coronary artery disease)    a. 03/2015 NSTEMI/PCI: LCX 148m (DES), OM1 100p (DES - vessel labeled  [place] in subsequent cath report). Minor irregs to LAD/RCA; b. 04/2015 Cath: LM nl, LAD 40p, RI patent stent, LCX patent stent, RCA nl, EF 55-65%; c. 02/2016 MV: basal inferolateral and mid inferolateral defect/infarct w/o ischemia. EF 55-65%; d. 11/2018 PCI: LM 30ost, LAD 30p, LCX 95p/m (PTCA->20%), OM1 10ost, OM2 40ost, RCA 85p.  Marland Kitchen Chronic diastolic (congestive) heart failure ([place])    a. 03/2015 Echo: EF 30-35%, mild concentric LVH, severe HK of inf, inflat, & lat walls, mod MR, mild TR;  b. 04/2015 LV Gram: EF 55-65%; c. 09/2017 Echo: EF 55-60%, no rwma. Mild MR; d. 11/2018 Echo: Ef 60-65%, no rwma.  . CKD (chronic kidney disease), stage III ([place])   . Diabetes type 2, controlled ([place])    a. since 2002   . Diabetic gastroparesis (HCC)    a. chronic nausea/vomiting.  . Diverticulosis, sigmoid 07/2016  . Heavy menses    a. H/O IUD - expired in 2014 - remains in place.  . Hyperlipidemia   . Hypertension   . Iron deficiency anemia   . Ischemic cardiomyopathy (resolved)    a. 03/2015 EF 30-35% post NSTEMI;  b. 04/2015 EF 55-65% on LV gram; c. 09/2017 Echo: EF 55-60%; d. 11/2018 Echo: Ef 60-65%.  . Obesity   . Tobacco abuse    a. quit 03/2015.  Marland Kitchen Uterine fibroid   . Vertigo   . Vitamin D deficiency     PAST SURGICAL HISTORY: Past Surgical History:  Procedure Laterality Date  . APPENDECTOMY    . CARDIAC CATHETERIZATION  4/16   x2 stent [facility]  . CARDIAC CATHETERIZATION N/A 05/05/2015   Procedure: Left Heart Cath and Coronary Angiography;  Surgeon: Peter M Martinique,  MD;  Location: [place] CV LAB;  Service: Cardiovascular;  Laterality: N/A;  . COLONOSCOPY WITH PROPOFOL N/A 07/31/2016   Procedure: COLONOSCOPY WITH PROPOFOL;  Surgeon: Lollie Sails, MD;  Location: [facility] ENDOSCOPY;  Service: Endoscopy;  Laterality: N/A;  . CORONARY BALLOON ANGIOPLASTY N/A 12/04/2018   Procedure: CORONARY BALLOON ANGIOPLASTY;  Surgeon: Wellington Hampshire, MD;  Location: [place] CV LAB;  Service: Cardiovascular;   Laterality: N/A;  . CORONARY STENT INTERVENTION N/A 01/12/2019   Procedure: CORONARY STENT INTERVENTION;  Surgeon: [name], MD;  Location: [place] CV LAB;  Service: Cardiovascular;  Laterality: N/A;  . ESOPHAGOGASTRODUODENOSCOPY (EGD) WITH PROPOFOL N/A 07/31/2016   Procedure: ESOPHAGOGASTRODUODENOSCOPY (EGD) WITH PROPOFOL;  Surgeon: Lollie Sails, MD;  Location: [facility] ENDOSCOPY;  Service: Endoscopy;  Laterality: N/A;  . INTRAVASCULAR PRESSURE WIRE/FFR STUDY N/A 12/04/2018   Procedure: INTRAVASCULAR PRESSURE WIRE/FFR STUDY;  Surgeon: Wellington Hampshire, MD;  Location: [place] CV LAB;  Service: Cardiovascular;  Laterality: N/A;  . LAPAROSCOPIC APPENDECTOMY N/A 08/07/2016   Procedure: APPENDECTOMY LAPAROSCOPIC;  Surgeon: Hubbard Robinson, MD;  Location: [facility] ORS;  Service: General;  Laterality: N/A;  . LEFT HEART CATH AND CORONARY ANGIOGRAPHY Left 12/04/2018   Procedure: LEFT HEART CATH AND CORONARY ANGIOGRAPHY;  Surgeon: Wellington Hampshire, MD;  Location: [place] CV LAB;  Service: Cardiovascular;  Laterality: Left;  . LEFT HEART CATH AND CORONARY ANGIOGRAPHY N/A 01/12/2019   Procedure: LEFT HEART CATH AND CORONARY ANGIOGRAPHY;  Surgeon: [name], MD;  Location: [place] CV LAB;  Service: Cardiovascular;  Laterality: N/A;  . MOUTH SURGERY      FAMILY HISTORY: Family History  Problem Relation Age of Onset  . Diabetes Father   . Cancer Maternal Grandmother        lung  . Cancer Maternal Grandfather        prostate  . Diabetes Paternal Grandfather   . Diabetes Paternal Grandmother   . Cancer - Cervical Maternal Aunt     ADVANCED DIRECTIVES (Y/N):  N  HEALTH MAINTENANCE: Social History   Tobacco Use  . Smoking status: Former Smoker    Years: 15.00    Last attempt to quit: 03/10/2015    Years since quitting: 3.9  . Smokeless tobacco: Never Used  Substance Use Topics  . Alcohol use: No    Alcohol/week: 0.0 standard drinks  . Drug use: No      Colonoscopy:  PAP:  Bone density:  Lipid panel:  Allergies  Allergen Reactions  . Lisinopril Anaphylaxis, Itching and Swelling  . Rosemary Oil Anaphylaxis  . Shellfish Allergy Itching and Swelling  . Other Itching and Swelling    [place] seasoning  . Metformin And Related Diarrhea, Nausea And Vomiting and Rash    Current Outpatient Medications  Medication Sig Dispense Refill  . aspirin EC 81 MG tablet Take 81 mg by mouth daily.    . benzonatate (TESSALON) 100 MG capsule Take 1 capsule (100 mg total) by mouth 2 (two) times daily as needed for cough. 30 capsule 12  . Cholecalciferol 2000 units TABS Take 2,000 Units by mouth daily.     . clopidogrel (PLAVIX) 75 MG tablet Take 4 tabs at 7pm tonight (12/28), then 1 tab daily beginning 12/29. (Patient taking differently: Take 75 mg by mouth daily. ) 90 tablet 3  . DULoxetine (CYMBALTA) 30 MG capsule Take 60 mg by mouth daily.   3  . etonogestrel (NEXPLANON) 68 MG IMPL implant 1 each by Subdermal route once.    Marland Kitchen  FARXIGA 10 MG TABS tablet Take 10 mg by mouth daily.   11  . ferrous sulfate 325 (65 FE) MG tablet Take 1 tablet (325 mg total) by mouth daily.    . furosemide (LASIX) 40 MG tablet Take 1 tablet (40 mg total) by mouth every other day. 30 tablet 5  . glimepiride (AMARYL) 4 MG tablet TAKE 1 TABLET BY MOUTH ONCE DAILY (Patient taking differently: Take 4 mg by mouth daily. ) 90 tablet 1  . glucose blood (ACCU-CHEK ACTIVE STRIPS) test strip Use as instructed 100 each 12  . glucose blood (ACCU-CHEK AVIVA) test strip 1 each by Other route as needed for other. Use as instructed    . insulin aspart (NOVOLOG) 100 UNIT/ML injection Inject 10 Units into the skin 3 (three) times daily with meals. (Patient taking differently: Inject 6-20 Units into the skin 3 (three) times daily with meals. ) 10 mL 11  . insulin glargine (LANTUS) 100 UNIT/ML injection Inject 0.4 mLs (40 Units total) into the skin at bedtime. (Patient taking differently:  Inject 60 Units into the skin at bedtime. ) 10 mL 11  . Insulin Pen Needle 32G X 4 MM MISC 1 Units by Does not apply route every morning. Pen needles 90 each 3  . irbesartan (AVAPRO) 150 MG tablet Take 150 mg by mouth daily.    . isosorbide mononitrate (IMDUR) 30 MG 24 hr tablet Take 2 tablets (60 mg total) by mouth daily. 90 tablet 3  . loperamide (IMODIUM A-D) 2 MG tablet Take 8-12 mg by mouth daily as needed for diarrhea or loose stools.    . meclizine (ANTIVERT) 25 MG tablet Take 25 mg by mouth 3 (three) times daily as needed for dizziness.    . metoprolol tartrate (LOPRESSOR) 50 MG tablet Take 1 tablet (50 mg total) by mouth 2 (two) times daily. 180 tablet 3  . mirtazapine (REMERON) 30 MG tablet Take 30 mg by mouth daily.   3  . nitroGLYCERIN (NITROSTAT) 0.4 MG SL tablet Take 1 tab under your tongue while sitting for chest pain, If no relief may repeat, one tab every 5 min up to 3 tabs total over 15 mins. 25 tablet 3  . pantoprazole (PROTONIX) 40 MG tablet TAKE 1 TABLET BY MOUTH ONCE DAILY AT 6AM (Patient taking differently: Take 40 mg by mouth 2 (two) times daily. ) 90 tablet 1   No current facility-administered medications for this visit.     OBJECTIVE: Vitals:   02/15/19 1304 02/15/19 1310  BP:  128/83  Pulse:  (!) 102  Temp: 98.7 F (37.1 C)      Body mass index is 47.28 kg/m.    ECOG FS:0 - Asymptomatic  General: Well-developed, well-nourished, no acute distress. Eyes: Pink conjunctiva, anicteric sclera. HEENT: Normocephalic, moist mucous membranes. Lungs: Clear to auscultation bilaterally. Heart: Regular rate and rhythm. No rubs, murmurs, or gallops. Abdomen: Soft, nontender, nondistended. No organomegaly noted, normoactive bowel sounds. Musculoskeletal: No edema, cyanosis, or clubbing. Neuro: Alert, answering all questions appropriately. Cranial nerves grossly intact. Skin: No rashes or petechiae noted. Psych: Normal affect.  LAB RESULTS:  Lab Results  Component  Value Date   NA 139 02/01/2019   K 4.7 02/01/2019   CL 108 02/01/2019   CO2 22 02/01/2019   GLUCOSE 163 (H) 02/01/2019   BUN 31 (H) 02/01/2019   CREATININE 1.40 (H) 02/01/2019   CALCIUM 8.7 (L) 02/01/2019   PROT 6.6 12/17/2018   ALBUMIN 3.3 (L) 12/17/2018   AST  30 12/17/2018   ALT 35 (H) 12/17/2018   ALKPHOS 240 (H) 12/17/2018   BILITOT <0.2 12/17/2018   GFRNONAA 47 (L) 02/01/2019   GFRAA 55 (L) 02/01/2019    Lab Results  Component Value Date   WBC 8.1 02/15/2019   NEUTROABS 5.0 02/15/2019   HGB 9.8 (L) 02/15/2019   HCT 32.1 (L) 02/15/2019   MCV 81.3 02/15/2019   PLT 380 02/15/2019   Lab Results  Component Value Date   IRON 34 02/15/2019   TIBC 324 02/15/2019   IRONPCTSAT 11 02/15/2019   Lab Results  Component Value Date   FERRITIN 23 02/15/2019    STUDIES: No results found.  ASSESSMENT: Thrombocytosis, iron deficiency anemia.  PLAN:    1.  Iron deficiency anemia: Patient's iron stores are borderline low and her hemoglobin is trending down.  She is also symptomatic.  She has been instructed to continue taking her oral iron supplementation.  Previously, the remainder of her laboratory work was either negative or within normal limits.  Proceed with 510 mg IV Feraheme today.  Return to clinic in 1 week for second infusion and then in 3 months with repeat laboratory work and further evaluation.   2.  Thrombocytosis: Resolved.  Likely secondary to iron deficiency.  Feraheme as above.  I spent a total of 30 minutes face-to-face with the patient of which greater than 50% of the visit was spent in counseling and coordination of care as detailed above.   Patient expressed understanding and was in agreement with this plan. She also understands that She can call clinic at any time with any questions, concerns, or complaints.   Cancer Staging No matching staging information was found for the patient.  Lloyd Huger, MD   02/15/2019 2:42 PM

## 2019-02-15 ENCOUNTER — Inpatient Hospital Stay: Payer: Medicare Other | Attending: Oncology

## 2019-02-15 ENCOUNTER — Inpatient Hospital Stay (HOSPITAL_BASED_OUTPATIENT_CLINIC_OR_DEPARTMENT_OTHER): Payer: Medicare Other | Admitting: Oncology

## 2019-02-15 ENCOUNTER — Inpatient Hospital Stay: Payer: Medicare Other

## 2019-02-15 ENCOUNTER — Other Ambulatory Visit: Payer: Self-pay

## 2019-02-15 VITALS — BP 128/83 | HR 102 | Temp 98.7°F | Wt 284.1 lb

## 2019-02-15 VITALS — BP 143/89 | HR 98 | Resp 18

## 2019-02-15 DIAGNOSIS — D509 Iron deficiency anemia, unspecified: Secondary | ICD-10-CM | POA: Diagnosis not present

## 2019-02-15 DIAGNOSIS — D649 Anemia, unspecified: Secondary | ICD-10-CM

## 2019-02-15 LAB — CBC WITH DIFFERENTIAL/PLATELET
Abs Immature Granulocytes: 0.02 10*3/uL (ref 0.00–0.07)
Basophils Absolute: 0 10*3/uL (ref 0.0–0.1)
Basophils Relative: 0 %
Eosinophils Absolute: 0.2 10*3/uL (ref 0.0–0.5)
Eosinophils Relative: 3 %
HCT: 32.1 % — ABNORMAL LOW (ref 36.0–46.0)
Hemoglobin: 9.8 g/dL — ABNORMAL LOW (ref 12.0–15.0)
Immature Granulocytes: 0 %
Lymphocytes Relative: 30 %
Lymphs Abs: 2.5 10*3/uL (ref 0.7–4.0)
MCH: 24.8 pg — ABNORMAL LOW (ref 26.0–34.0)
MCHC: 30.5 g/dL (ref 30.0–36.0)
MCV: 81.3 fL (ref 80.0–100.0)
Monocytes Absolute: 0.4 10*3/uL (ref 0.1–1.0)
Monocytes Relative: 5 %
Neutro Abs: 5 10*3/uL (ref 1.7–7.7)
Neutrophils Relative %: 62 %
Platelets: 380 10*3/uL (ref 150–400)
RBC: 3.95 MIL/uL (ref 3.87–5.11)
RDW: 16.1 % — ABNORMAL HIGH (ref 11.5–15.5)
WBC: 8.1 10*3/uL (ref 4.0–10.5)
nRBC: 0 % (ref 0.0–0.2)

## 2019-02-15 LAB — FERRITIN: Ferritin: 23 ng/mL (ref 11–307)

## 2019-02-15 LAB — IRON AND TIBC
Iron: 34 ug/dL (ref 28–170)
Saturation Ratios: 11 % (ref 10.4–31.8)
TIBC: 324 ug/dL (ref 250–450)
UIBC: 290 ug/dL

## 2019-02-15 MED ORDER — SODIUM CHLORIDE 0.9 % IV SOLN
Freq: Once | INTRAVENOUS | Status: AC
  Administered 2019-02-15: 14:00:00 via INTRAVENOUS
  Filled 2019-02-15: qty 250

## 2019-02-15 MED ORDER — SODIUM CHLORIDE 0.9 % IV SOLN
510.0000 mg | Freq: Once | INTRAVENOUS | Status: AC
  Administered 2019-02-15: 510 mg via INTRAVENOUS
  Filled 2019-02-15: qty 17

## 2019-02-18 ENCOUNTER — Encounter: Payer: Medicare Other | Admitting: *Deleted

## 2019-02-22 ENCOUNTER — Inpatient Hospital Stay: Payer: Medicare Other

## 2019-02-23 ENCOUNTER — Inpatient Hospital Stay: Payer: Medicare Other

## 2019-02-23 ENCOUNTER — Other Ambulatory Visit: Payer: Self-pay

## 2019-02-23 VITALS — BP 131/85 | HR 75 | Temp 97.6°F | Resp 18

## 2019-02-23 DIAGNOSIS — D649 Anemia, unspecified: Secondary | ICD-10-CM

## 2019-02-23 DIAGNOSIS — D509 Iron deficiency anemia, unspecified: Secondary | ICD-10-CM | POA: Diagnosis not present

## 2019-02-23 MED ORDER — SODIUM CHLORIDE 0.9 % IV SOLN
Freq: Once | INTRAVENOUS | Status: AC
  Administered 2019-02-23: 14:00:00 via INTRAVENOUS
  Filled 2019-02-23: qty 250

## 2019-02-23 MED ORDER — SODIUM CHLORIDE 0.9 % IV SOLN
510.0000 mg | Freq: Once | INTRAVENOUS | Status: AC
  Administered 2019-02-23: 510 mg via INTRAVENOUS
  Filled 2019-02-23: qty 17

## 2019-03-01 DIAGNOSIS — Z955 Presence of coronary angioplasty implant and graft: Secondary | ICD-10-CM

## 2019-03-03 ENCOUNTER — Encounter: Payer: Self-pay | Admitting: *Deleted

## 2019-03-03 DIAGNOSIS — Z955 Presence of coronary angioplasty implant and graft: Secondary | ICD-10-CM

## 2019-03-03 NOTE — Progress Notes (Unsigned)
Cardiac Individual Treatment Plan  Patient Details  Name: [name] [name] MRN: [id number] Date of Birth: 05-18-1981 Referring Provider:     Cardiac Rehab from 01/21/2019 in [facility] Cardiac and Pulmonary Rehab  Referring Provider  End, Christiopher MD [Attending Cardiologist: Dr. Rogue Jury Arida]      Initial Encounter Date:    Cardiac Rehab from 01/21/2019 in [facility] Cardiac and Pulmonary Rehab  Date  01/21/19      Visit Diagnosis: Status post coronary artery stent placement  Patient's Home Medications on Admission:  Current Outpatient Medications:  .  aspirin EC 81 MG tablet, Take 81 mg by mouth daily., Disp: , Rfl:  .  benzonatate (TESSALON) 100 MG capsule, Take 1 capsule (100 mg total) by mouth 2 (two) times daily as needed for cough., Disp: 30 capsule, Rfl: 12 .  Cholecalciferol 2000 units TABS, Take 2,000 Units by mouth daily. , Disp: , Rfl:  .  clopidogrel (PLAVIX) 75 MG tablet, Take 4 tabs at 7pm tonight (12/28), then 1 tab daily beginning 12/29. (Patient taking differently: Take 75 mg by mouth daily. ), Disp: 90 tablet, Rfl: 3 .  DULoxetine (CYMBALTA) 30 MG capsule, Take 60 mg by mouth daily. , Disp: , Rfl: 3 .  etonogestrel (NEXPLANON) 68 MG IMPL implant, 1 each by Subdermal route once., Disp: , Rfl:  .  FARXIGA 10 MG TABS tablet, Take 10 mg by mouth daily. , Disp: , Rfl: 11 .  ferrous sulfate 325 (65 FE) MG tablet, Take 1 tablet (325 mg total) by mouth daily., Disp: , Rfl:  .  furosemide (LASIX) 40 MG tablet, Take 1 tablet (40 mg total) by mouth every other day., Disp: 30 tablet, Rfl: 5 .  glimepiride (AMARYL) 4 MG tablet, TAKE 1 TABLET BY MOUTH ONCE DAILY (Patient taking differently: Take 4 mg by mouth daily. ), Disp: 90 tablet, Rfl: 1 .  glucose blood (ACCU-CHEK ACTIVE STRIPS) test strip, Use as instructed, Disp: 100 each, Rfl: 12 .  glucose blood (ACCU-CHEK AVIVA) test strip, 1 each by Other route as needed for other. Use as instructed, Disp: , Rfl:  .  insulin aspart (NOVOLOG)  100 UNIT/ML injection, Inject 10 Units into the skin 3 (three) times daily with meals. (Patient taking differently: Inject 6-20 Units into the skin 3 (three) times daily with meals. ), Disp: 10 mL, Rfl: 11 .  insulin glargine (LANTUS) 100 UNIT/ML injection, Inject 0.4 mLs (40 Units total) into the skin at bedtime. (Patient taking differently: Inject 60 Units into the skin at bedtime. ), Disp: 10 mL, Rfl: 11 .  Insulin Pen Needle 32G X 4 MM MISC, 1 Units by Does not apply route every morning. Pen needles, Disp: 90 each, Rfl: 3 .  irbesartan (AVAPRO) 150 MG tablet, Take 150 mg by mouth daily., Disp: , Rfl:  .  isosorbide mononitrate (IMDUR) 30 MG 24 hr tablet, Take 2 tablets (60 mg total) by mouth daily., Disp: 90 tablet, Rfl: 3 .  loperamide (IMODIUM A-D) 2 MG tablet, Take 8-12 mg by mouth daily as needed for diarrhea or loose stools., Disp: , Rfl:  .  meclizine (ANTIVERT) 25 MG tablet, Take 25 mg by mouth 3 (three) times daily as needed for dizziness., Disp: , Rfl:  .  metoprolol tartrate (LOPRESSOR) 50 MG tablet, Take 1 tablet (50 mg total) by mouth 2 (two) times daily., Disp: 180 tablet, Rfl: 3 .  mirtazapine (REMERON) 30 MG tablet, Take 30 mg by mouth daily. , Disp: , Rfl: 3 .  nitroGLYCERIN (  NITROSTAT) 0.4 MG SL tablet, Take 1 tab under your tongue while sitting for chest pain, If no relief may repeat, one tab every 5 min up to 3 tabs total over 15 mins., Disp: 25 tablet, Rfl: 3 .  pantoprazole (PROTONIX) 40 MG tablet, TAKE 1 TABLET BY MOUTH ONCE DAILY AT 6AM (Patient taking differently: Take 40 mg by mouth 2 (two) times daily. ), Disp: 90 tablet, Rfl: 1  Past Medical History: Past Medical History:  Diagnosis Date  . CAD (coronary artery disease)    a. 03/2015 NSTEMI/PCI: LCX 139m(DES), OM1 100p (DES - vessel labeled Ramus in subsequent cath report). Minor irregs to LAD/RCA; b. 04/2015 Cath: LM nl, LAD 40p, RI patent stent, LCX patent stent, RCA nl, EF 55-65%; c. 02/2016 MV: basal inferolateral and  mid inferolateral defect/infarct w/o ischemia. EF 55-65%; d. 11/2018 PCI: LM 30ost, LAD 30p, LCX 95p/m (PTCA->20%), OM1 10ost, OM2 40ost, RCA 85p.  .Marland KitchenChronic diastolic (congestive) heart failure ([facility]    a. 03/2015 Echo: EF 30-35%, mild concentric LVH, severe HK of inf, inflat, & lat walls, mod MR, mild TR;  b. 04/2015 LV Gram: EF 55-65%; c. 09/2017 Echo: EF 55-60%, no rwma. Mild MR; d. 11/2018 Echo: Ef 60-65%, no rwma.  . CKD (chronic kidney disease), stage III ([place]   . Diabetes type 2, controlled ([place]    a. since 2002   . Diabetic gastroparesis (HCC)    a. chronic nausea/vomiting.  . Diverticulosis, sigmoid 07/2016  . Heavy menses    a. H/O IUD - expired in 2014 - remains in place.  . Hyperlipidemia   . Hypertension   . Iron deficiency anemia   . Ischemic cardiomyopathy (resolved)    a. 03/2015 EF 30-35% post NSTEMI;  b. 04/2015 EF 55-65% on LV gram; c. 09/2017 Echo: EF 55-60%; d. 11/2018 Echo: Ef 60-65%.  . Obesity   . Tobacco abuse    a. quit 03/2015.  .Marland KitchenUterine fibroid   . Vertigo   . Vitamin D deficiency     Tobacco Use: Social History   Tobacco Use  Smoking Status Former Smoker  . Years: 15.00  . Last attempt to quit: 03/10/2015  . Years since quitting: 3.9  Smokeless Tobacco Never Used    Labs: Recent Review Flowsheet Data    Labs for ITP Cardiac and Pulmonary Rehab Latest Ref Rng & Units 10/27/2017 01/09/2018 01/22/2018 10/06/2018 12/17/2018   Cholestrol 100 - 199 mg/dL - - - 236(H) 245(H)   LDLCALC 0 - 99 mg/dL - - - 163(H) 165(H)   LDLDIRECT 0 - 99 mg/dL - - - - 163(H)   HDL >39 mg/dL - - - 39(L) 53   Trlycerides 0 - 149 mg/dL - - - 171(H) 137   Hemoglobin A1c 4.8 - 5.6 % 11.1(H) 9.6(H) 11.5(H) - -       Exercise Target Goals: Exercise Program Goal: Individual exercise prescription set using results from initial 6 min walk test and THRR while considering  patient's activity barriers and safety.   Exercise Prescription Goal: Initial exercise prescription builds to  30-45 minutes a day of aerobic activity, 2-3 days per week.  Home exercise guidelines will be given to patient during program as part of exercise prescription that the participant will acknowledge.  Activity Barriers & Risk Stratification: Activity Barriers & Cardiac Risk Stratification - 01/21/19 1355      Activity Barriers & Cardiac Risk Stratification   Activity Barriers  Deconditioning;Muscular Weakness;Shortness of Breath;Balance Concerns;Back Problems;Joint Problems;Decreased Ventricular  Function   knees and back can be very painful (chronic), occasional dizzy spells   Cardiac Risk Stratification  High       6 Minute Walk: 6 Minute Walk    Row Name 01/21/19 1354         6 Minute Walk   Phase  Initial     Distance  850 feet     Walk Time  6 minutes     MPH  1.61     METS  3.5     RPE  12     Perceived Dyspnea   1     VO2 Peak  12.25     Symptoms  Yes (comment)     Comments  legs tired and painful 9/10     Resting HR  103 bpm     Resting BP  126/74     Resting Oxygen Saturation   96 %     Exercise Oxygen Saturation  during 6 min walk  97 %     Max Ex. HR  135 bpm     Max Ex. BP  156/74     2 Minute Post BP  130/70        Oxygen Initial Assessment:   Oxygen Re-Evaluation:   Oxygen Discharge (Final Oxygen Re-Evaluation):   Initial Exercise Prescription: Initial Exercise Prescription - 01/21/19 1300      Date of Initial Exercise RX and Referring Provider   Date  01/21/19    Referring Provider  End, Christiopher MD   Attending Cardiologist: Dr. Kathlyn [place]     Treadmill   MPH  1.4    Grade  0.5    Minutes  15    METs  2.17      REL-XR   Level  1    Speed  50    Minutes  15    METs  2.5      T5 Nustep   Level  2    SPM  80    Minutes  15    METs  2.5      Prescription Details   Frequency (times per week)  3    Duration  Progress to 45 minutes of aerobic exercise without signs/symptoms of physical distress      Intensity   THRR 40-80% of  Max Heartrate  135-166    Ratings of Perceived Exertion  11-13    Perceived Dyspnea  0-4      Progression   Progression  Continue to progress workloads to maintain intensity without signs/symptoms of physical distress.      Resistance Training   Training Prescription  Yes    Weight  3 lbs    Reps  10-15       Perform Capillary Blood Glucose checks as needed.  Exercise Prescription Changes: Exercise Prescription Changes    Row Name 01/21/19 1300             Response to Exercise   Blood Pressure (Admit)  126/74       Blood Pressure (Exercise)  156/74       Blood Pressure (Exit)  130/70       Heart Rate (Admit)  103 bpm       Heart Rate (Exercise)  135 bpm       Heart Rate (Exit)  108 bpm       Oxygen Saturation (Admit)  96 %       Oxygen Saturation (Exercise)  97 %  Rating of Perceived Exertion (Exercise)  12       Perceived Dyspnea (Exercise)  1       Symptoms  legs tired and pain 9/10, SOB       Comments  walk test results          Exercise Comments:  Exercise Comments    Row Name 02/01/19 1737 02/23/19 1148         Exercise Comments   First full day of exercise!  Patient was oriented to gym and equipment including functions, settings, policies, and procedures.  Patient's individual exercise prescription and treatment plan were reviewed.  All starting workloads were established based on the results of the 6 minute walk test done at initial orientation visit.  The plan for exercise progression was also introduced and progression will be customized based on patient's performance and goals.  Reviewed home exercise with pt today.  Pt plans to walk for exercise.  Reviewed THR, pulse, RPE, sign and symptoms, NTG use, and when to call 911 or MD.  Also discussed weather considerations and indoor options.  Pt voiced understanding.         Exercise Goals and Review: Exercise Goals    Row Name 01/21/19 1406             Exercise Goals   Increase Physical Activity   Yes       Intervention  Provide advice, education, support and counseling about physical activity/exercise needs.;Develop an individualized exercise prescription for aerobic and resistive training based on initial evaluation findings, risk stratification, comorbidities and participant's personal goals.       Expected Outcomes  Short Term: Attend rehab on a regular basis to increase amount of physical activity.;Long Term: Exercising regularly at least 3-5 days a week.;Long Term: Add in home exercise to make exercise part of routine and to increase amount of physical activity.       Increase Strength and Stamina  Yes       Intervention  Provide advice, education, support and counseling about physical activity/exercise needs.;Develop an individualized exercise prescription for aerobic and resistive training based on initial evaluation findings, risk stratification, comorbidities and participant's personal goals.       Expected Outcomes  Short Term: Increase workloads from initial exercise prescription for resistance, speed, and METs.;Short Term: Perform resistance training exercises routinely during rehab and add in resistance training at home;Long Term: Improve cardiorespiratory fitness, muscular endurance and strength as measured by increased METs and functional capacity (6MWT)       Able to understand and use rate of perceived exertion (RPE) scale  Yes       Intervention  Provide education and explanation on how to use RPE scale       Expected Outcomes  Short Term: Able to use RPE daily in rehab to express subjective intensity level;Long Term:  Able to use RPE to guide intensity level when exercising independently       Knowledge and understanding of Target Heart Rate Range (THRR)  Yes       Intervention  Provide education and explanation of THRR including how the numbers were predicted and where they are located for reference       Expected Outcomes  Short Term: Able to state/look up THRR;Short Term:  Able to use daily as guideline for intensity in rehab;Long Term: Able to use THRR to govern intensity when exercising independently       Able to check pulse independently  Yes  Intervention  Provide education and demonstration on how to check pulse in carotid and radial arteries.;Review the importance of being able to check your own pulse for safety during independent exercise       Expected Outcomes  Short Term: Able to explain why pulse checking is important during independent exercise;Long Term: Able to check pulse independently and accurately       Understanding of Exercise Prescription  Yes       Intervention  Provide education, explanation, and written materials on patient's individual exercise prescription       Expected Outcomes  Short Term: Able to explain program exercise prescription;Long Term: Able to explain home exercise prescription to exercise independently          Exercise Goals Re-Evaluation : Exercise Goals Re-Evaluation    Row Name 02/01/19 1737 02/23/19 1146           Exercise Goal Re-Evaluation   Exercise Goals Review  Increase Physical Activity;Increase Strength and Stamina;Able to understand and use rate of perceived exertion (RPE) scale;Able to understand and use Dyspnea scale  Increase Physical Activity;Increase Strength and Stamina;Able to understand and use rate of perceived exertion (RPE) scale;Knowledge and understanding of Target Heart Rate Range (THRR);Able to check pulse independently;Understanding of Exercise Prescription      Comments  Reviewed RPE scale, THR and program prescription with pt today.  Pt voiced understanding and was given a copy of goals to take home.   Reviewed home exercise with pt today.  Pt plans to use gym at apartment complex for exercise.  Reviewed THR, pulse, RPE, sign and symptoms, NTG use, and when to call 911 or MD.  Also discussed weather considerations and indoor options.  Pt voiced understanding.  HT classes ar enot meeting at  this time due to [place].        Expected Outcomes  Short: Use RPE daily to regulate intensity. Long: Follow program prescription in THR.  Short - exercise on her own while missing classes Long - improve overall MET level         Discharge Exercise Prescription (Final Exercise Prescription Changes): Exercise Prescription Changes - 01/21/19 1300      Response to Exercise   Blood Pressure (Admit)  126/74    Blood Pressure (Exercise)  156/74    Blood Pressure (Exit)  130/70    Heart Rate (Admit)  103 bpm    Heart Rate (Exercise)  135 bpm    Heart Rate (Exit)  108 bpm    Oxygen Saturation (Admit)  96 %    Oxygen Saturation (Exercise)  97 %    Rating of Perceived Exertion (Exercise)  12    Perceived Dyspnea (Exercise)  1    Symptoms  legs tired and pain 9/10, SOB    Comments  walk test results       Nutrition:  Target Goals: Understanding of nutrition guidelines, daily intake of sodium '1500mg'$ , cholesterol '200mg'$ , calories 30% from fat and 7% or less from saturated fats, daily to have 5 or more servings of fruits and vegetables.  Biometrics: Pre Biometrics - 01/21/19 1407      Pre Biometrics   Height  5' 6.4" (1.687 m)    Weight  275 lb 12.8 oz (125.1 kg)    Waist Circumference  50 inches    Hip Circumference  52 inches    Waist to Hip Ratio  0.96 %    BMI (Calculated)  43.96    Single Leg Stand  4.58  seconds        Nutrition Therapy Plan and Nutrition Goals: Nutrition Therapy & Goals - 02/24/19 1241      Nutrition Therapy   Diet  Diabetes Heart Healthy Low Sodium       Nutrition Assessments: Nutrition Assessments - 01/21/19 1429      MEDFICTS Scores   Pre Score  24       Nutrition Goals Re-Evaluation:   Nutrition Goals Discharge (Final Nutrition Goals Re-Evaluation):   Psychosocial: Target Goals: Acknowledge presence or absence of significant depression and/or stress, maximize coping skills, provide positive support system. Participant is able to  verbalize types and ability to use techniques and skills needed for reducing stress and depression.   Initial Review & Psychosocial Screening: Initial Psych Review & Screening - 01/21/19 1426      Initial Review   Current issues with  Current Depression;Current Anxiety/Panic;Current Stress Concerns    Source of Stress Concerns  Chronic Illness;Unable to perform yard/household activities;Unable to participate in former interests or hobbies;Family    Comments  Jailani reports her quality of life is low due to inability to participate in some daily activities and hobbies related to her lack of stamina. She said her husband, who is a Administrator, and her 54 year old daughter are very supportive. She wants to work on her energy level while here      [place]?  Yes   husband, daughter, family     Barriers   Psychosocial barriers to participate in program  There are no identifiable barriers or psychosocial needs.;The patient should benefit from training in stress management and relaxation.      Screening Interventions   Interventions  Encouraged to exercise;Program counselor consult;To provide support and resources with identified psychosocial needs;Provide feedback about the scores to participant    Expected Outcomes  Short Term goal: Utilizing psychosocial counselor, staff and physician to assist with identification of specific Stressors or current issues interfering with healing process. Setting desired goal for each stressor or current issue identified.;Long Term Goal: Stressors or current issues are controlled or eliminated.;Short Term goal: Identification and review with participant of any Quality of Life or Depression concerns found by scoring the questionnaire.;Long Term goal: The participant improves quality of Life and PHQ9 Scores as seen by post scores and/or verbalization of changes       Quality of Life Scores:  Quality of Life - 01/21/19 1428       Quality of Life   Select  Quality of Life      Quality of Life Scores   Health/Function Pre  3.2 %    Socioeconomic Pre  15.43 %    Psych/Spiritual Pre  3.43 %    Family Pre  20.4 %    GLOBAL Pre  8.23 %      Scores of 19 and below usually indicate a poorer quality of life in these areas.  A difference of  2-3 points is a clinically meaningful difference.  A difference of 2-3 points in the total score of the Quality of Life Index has been associated with significant improvement in overall quality of life, self-image, physical symptoms, and general health in studies assessing change in quality of life.  PHQ-9: Recent Review Flowsheet Data    Depression screen [facility] [employer] 2/9 01/21/2019 10/06/2018 01/09/2018 08/18/2017 08/12/2017   Decreased Interest 3 0 0 1 2   Down, Depressed, Hopeless '2 1 3 1 2   '$ PHQ - 2  Score '5 1 3 2 4   '$ Altered sleeping '3 1 3 '$ - 3   Tired, decreased energy '3 1 3 '$ - 2   Change in appetite '3 1 3 '$ - 2   Feeling bad or failure about yourself  '3 1 2 '$ - 2   Trouble concentrating '1 1 2 '$ - 2   Moving slowly or fidgety/restless 2 0 0 - 0   Suicidal thoughts 0 0 2 0 0   PHQ-9 Score '20 6 18 '$ - 15   Difficult doing work/chores Extremely dIfficult Very difficult - - -     Interpretation of Total Score  Total Score Depression Severity:  1-4 = Minimal depression, 5-9 = Mild depression, 10-14 = Moderate depression, 15-19 = Moderately severe depression, 20-27 = Severe depression   Psychosocial Evaluation and Intervention: Psychosocial Evaluation - 03/01/19 1043      Psychosocial Evaluation & Interventions   Comments  Chaplain attempted to reach patient to conduct initial psychosocial evaluation.  Message left requesting call back from patient.    Expected Outcomes  Counselor will continue to attempt to reach patient.    Continue Psychosocial Services   Follow up required by counselor       Psychosocial Re-Evaluation:   Psychosocial Discharge (Final Psychosocial  Re-Evaluation):   Vocational Rehabilitation: Provide vocational rehab assistance to qualifying candidates.   Vocational Rehab Evaluation & Intervention: Vocational Rehab - 01/21/19 1426      Initial Vocational Rehab Evaluation & Intervention   Assessment shows need for Vocational Rehabilitation  No       Education: Education Goals: Education classes will be provided on a variety of topics geared toward better understanding of heart health and risk factor modification. Participant will state understanding/return demonstration of topics presented as noted by education test scores.  Learning Barriers/Preferences: Learning Barriers/Preferences - 01/21/19 1425      Learning Barriers/Preferences   Learning Barriers  None    Learning Preferences  None       Education Topics:  AED/CPR: - Group verbal and written instruction with the use of models to demonstrate the basic use of the AED with the basic ABC's of resuscitation.   Cardiac Rehab from 02/01/2019 in [facility] Cardiac and Pulmonary Rehab  Date  02/01/19  Educator  CE  Instruction Review Code  1- Verbalizes Understanding      General Nutrition Guidelines/Fats and Fiber: -Group instruction provided by verbal, written material, models and posters to present the general guidelines for heart healthy nutrition. Gives an explanation and review of dietary fats and fiber.   Controlling Sodium/Reading Food Labels: -Group verbal and written material supporting the discussion of sodium use in heart healthy nutrition. Review and explanation with models, verbal and written materials for utilization of the food label.   Cardiac Rehab from 08/02/2015 in [facility] Cardiac and Pulmonary Rehab  Date  07/31/15  Educator  PI  Instruction Review Code (retired)  2- meets goals/outcomes      Exercise Physiology & General Exercise Guidelines: - Group verbal and written instruction with models to review the exercise physiology of the cardiovascular  system and associated critical values. Provides general exercise guidelines with specific guidelines to those with heart or lung disease.    Aerobic Exercise & Resistance Training: - Gives group verbal and written instruction on the various components of exercise. Focuses on aerobic and resistive training programs and the benefits of this training and how to safely progress through these programs..   Flexibility, Balance, Mind/Body Relaxation: Provides group verbal/written  instruction on the benefits of flexibility and balance training, including mind/body exercise modes such as yoga, pilates and tai chi.  Demonstration and skill practice provided.   Stress and Anxiety: - Provides group verbal and written instruction about the health risks of elevated stress and causes of high stress.  Discuss the correlation between heart/lung disease and anxiety and treatment options. Review healthy ways to manage with stress and anxiety.   Depression: - Provides group verbal and written instruction on the correlation between heart/lung disease and depressed mood, treatment options, and the stigmas associated with seeking treatment.   Cardiac Rehab from 08/02/2015 in [facility] Cardiac and Pulmonary Rehab  Date  08/02/15  Educator  [facility]  Instruction Review Code (retired)  2- meets Designer, fashion/clothing & Physiology of the Heart: - Group verbal and written instruction and models provide basic cardiac anatomy and physiology, with the coronary electrical and arterial systems. Review of Valvular disease and Heart Failure   Cardiac Procedures: - Group verbal and written instruction to review commonly prescribed medications for heart disease. Reviews the medication, class of the drug, and side effects. Includes the steps to properly store meds and maintain the prescription regimen. (beta blockers and nitrates)   Cardiac Medications I: - Group verbal and written instruction to review commonly prescribed  medications for heart disease. Reviews the medication, class of the drug, and side effects. Includes the steps to properly store meds and maintain the prescription regimen.   Cardiac Medications II: -Group verbal and written instruction to review commonly prescribed medications for heart disease. Reviews the medication, class of the drug, and side effects. (all other drug classes)    Go Sex-Intimacy & Heart Disease, Get SMART - Goal Setting: - Group verbal and written instruction through game format to discuss heart disease and the return to sexual intimacy. Provides group verbal and written material to discuss and apply goal setting through the application of the S.M.A.R.T. Method.   Other Matters of the Heart: - Provides group verbal, written materials and models to describe Stable Angina and Peripheral Artery. Includes description of the disease process and treatment options available to the cardiac patient.   Exercise & Equipment Safety: - Individual verbal instruction and demonstration of equipment use and safety with use of the equipment.   Cardiac Rehab from 02/01/2019 in [facility] Cardiac and Pulmonary Rehab  Date  01/21/19  Educator  [facility]  Instruction Review Code  1- Verbalizes Understanding      Infection Prevention: - Provides verbal and written material to individual with discussion of infection control including proper hand washing and proper equipment cleaning during exercise session.   Cardiac Rehab from 02/01/2019 in [facility] Cardiac and Pulmonary Rehab  Date  01/21/19  Educator  [facility]  Instruction Review Code  1- Verbalizes Understanding      Falls Prevention: - Provides verbal and written material to individual with discussion of falls prevention and safety.   Cardiac Rehab from 02/01/2019 in [facility] Cardiac and Pulmonary Rehab  Date  01/21/19  Educator  [facility]  Instruction Review Code  1- Verbalizes Understanding      Diabetes: - Individual verbal and written instruction to  review signs/symptoms of diabetes, desired ranges of glucose level fasting, after meals and with exercise. Acknowledge that pre and post exercise glucose checks will be done for 3 sessions at entry of program.   Cardiac Rehab from 02/01/2019 in [facility] Cardiac and Pulmonary Rehab  Date  01/21/19  Educator  [facility]  Instruction  Review Code  1- Verbalizes Understanding      Know Your Numbers and Risk Factors: -Group verbal and written instruction about important numbers in your health.  Discussion of what are risk factors and how they play a role in the disease process.  Review of Cholesterol, Blood Pressure, Diabetes, and BMI and the role they play in your overall health.   Sleep Hygiene: -Provides group verbal and written instruction about how sleep can affect your health.  Define sleep hygiene, discuss sleep cycles and impact of sleep habits. Review good sleep hygiene tips.    Other: -Provides group and verbal instruction on various topics (see comments)   Knowledge Questionnaire Score: Knowledge Questionnaire Score - 01/21/19 1425      Knowledge Questionnaire Score   Pre Score  24/26   correct answers reviewed with [place]. Focus on PAD and Nutrition       Core Components/Risk Factors/Patient Goals at Admission: Personal Goals and Risk Factors at Admission - 01/21/19 1424      Core Components/Risk Factors/Patient Goals on Admission    Weight Management  Yes;Obesity;Weight Loss    Intervention  Weight Management: Develop a combined nutrition and exercise program designed to reach desired caloric intake, while maintaining appropriate intake of nutrient and fiber, sodium and fats, and appropriate energy expenditure required for the weight goal.;Weight Management: Provide education and appropriate resources to help participant work on and attain dietary goals.;Weight Management/Obesity: Establish reasonable short term and long term weight goals.;Obesity: Provide education and appropriate  resources to help participant work on and attain dietary goals.    Admit Weight  275 lb 12.8 oz (125.1 kg)    Goal Weight: Short Term  270 lb (122.5 kg)    Goal Weight: Long Term  140 lb (63.5 kg)    Expected Outcomes  Short Term: Continue to assess and modify interventions until short term weight is achieved;Long Term: Adherence to nutrition and physical activity/exercise program aimed toward attainment of established weight goal;Weight Loss: Understanding of general recommendations for a balanced deficit meal plan, which promotes 1-2 lb weight loss per week and includes a negative energy balance of [phone] kcal/d;Understanding recommendations for meals to include 15-35% energy as protein, 25-35% energy from fat, 35-60% energy from carbohydrates, less than '200mg'$  of dietary cholesterol, 20-35 gm of total fiber daily;Understanding of distribution of calorie intake throughout the day with the consumption of 4-5 meals/snacks    Diabetes  Yes    Intervention  Provide education about signs/symptoms and action to take for hypo/hyperglycemia.;Provide education about proper nutrition, including hydration, and aerobic/resistive exercise prescription along with prescribed medications to achieve blood glucose in normal ranges: Fasting glucose 65-99 mg/dL    Expected Outcomes  Short Term: Participant verbalizes understanding of the signs/symptoms and immediate care of hyper/hypoglycemia, proper foot care and importance of medication, aerobic/resistive exercise and nutrition plan for blood glucose control.;Long Term: Attainment of HbA1C < 7%.    Heart Failure  Yes    Intervention  Provide a combined exercise and nutrition program that is supplemented with education, support and counseling about heart failure. Directed toward relieving symptoms such as shortness of breath, decreased exercise tolerance, and extremity edema.    Expected Outcomes  Improve functional capacity of life;Short term: Attendance in program 2-3  days a week with increased exercise capacity. Reported lower sodium intake. Reported increased fruit and vegetable intake. Reports medication compliance.;Short term: Daily weights obtained and reported for increase. Utilizing diuretic protocols set by physician.;Long term: Adoption of self-care skills and  reduction of barriers for early signs and symptoms recognition and intervention leading to self-care maintenance.    Hypertension  Yes    Intervention  Provide education on lifestyle modifcations including regular physical activity/exercise, weight management, moderate sodium restriction and increased consumption of fresh fruit, vegetables, and low fat dairy, alcohol moderation, and smoking cessation.;Monitor prescription use compliance.    Expected Outcomes  Short Term: Continued assessment and intervention until BP is < 140/32m HG in hypertensive participants. < 130/822mHG in hypertensive participants with diabetes, heart failure or chronic kidney disease.;Long Term: Maintenance of blood pressure at goal levels.    Lipids  Yes    Intervention  Provide education and support for participant on nutrition & aerobic/resistive exercise along with prescribed medications to achieve LDL '70mg'$ , HDL >'40mg'$ .    Expected Outcomes  Short Term: Participant states understanding of desired cholesterol values and is compliant with medications prescribed. Participant is following exercise prescription and nutrition guidelines.;Long Term: Cholesterol controlled with medications as prescribed, with individualized exercise RX and with personalized nutrition plan. Value goals: LDL < '70mg'$ , HDL > 40 mg.       Core Components/Risk Factors/Patient Goals Review:  Goals and Risk Factor Review    Row Name 02/23/19 1149             Core Components/Risk Factors/Patient Goals Review   Personal Goals Review  Diabetes;Hypertension;Other       Review  [place] an infusion tomorrow due to anemia.  She is taking all meds as  directed and checking BG and BP at home.  They have been in normal range.  She is walking up and down stairs at her home foe exercise.  She states she has a lot of pain but wants to move.  She will consult with Dr tomorrow about level of exercise considering infusion.       Expected Outcomes  Short - check with Dr about exercise due to infusion Long - improve exercise level          Core Components/Risk Factors/Patient Goals at Discharge (Final Review):  Goals and Risk Factor Review - 02/23/19 1149      Core Components/Risk Factors/Patient Goals Review   Personal Goals Review  Diabetes;Hypertension;Other    Review  [place] an infusion tomorrow due to anemia.  She is taking all meds as directed and checking BG and BP at home.  They have been in normal range.  She is walking up and down stairs at her home foe exercise.  She states she has a lot of pain but wants to move.  She will consult with Dr tomorrow about level of exercise considering infusion.    Expected Outcomes  Short - check with Dr about exercise due to infusion Long - improve exercise level       ITP Comments: ITP Comments    Row Name 02/03/19 0620 02/26/19 0809 03/03/19 1304       ITP Comments  30 day review. Continue with ITP unless directed changes by Medical Director chart review. New to program  Our program is currently closed due to COVID-19.  We are communicating with patient via phone calls and emails.   30 day review. Continue with ITP unless directed changes by Medical Director chart review.         Comments:

## 2019-03-03 NOTE — Progress Notes (Signed)
Cardiac Individual Treatment Plan  Patient Details  Name: [name] [name] MRN: [id number] Date of Birth: 17-Jul-1981 Referring Provider:     Cardiac Rehab from 01/21/2019 in [facility] Cardiac and Pulmonary Rehab  Referring Provider  End, Christiopher MD [Attending Cardiologist: Dr. Rogue Jury Arida]      Initial Encounter Date:    Cardiac Rehab from 01/21/2019 in [facility] Cardiac and Pulmonary Rehab  Date  01/21/19      Visit Diagnosis: Status post coronary artery stent placement  Patient's Home Medications on Admission:  Current Outpatient Medications:  .  aspirin EC 81 MG tablet, Take 81 mg by mouth daily., Disp: , Rfl:  .  benzonatate (TESSALON) 100 MG capsule, Take 1 capsule (100 mg total) by mouth 2 (two) times daily as needed for cough., Disp: 30 capsule, Rfl: 12 .  Cholecalciferol 2000 units TABS, Take 2,000 Units by mouth daily. , Disp: , Rfl:  .  clopidogrel (PLAVIX) 75 MG tablet, Take 4 tabs at 7pm tonight (12/28), then 1 tab daily beginning 12/29. (Patient taking differently: Take 75 mg by mouth daily. ), Disp: 90 tablet, Rfl: 3 .  DULoxetine (CYMBALTA) 30 MG capsule, Take 60 mg by mouth daily. , Disp: , Rfl: 3 .  etonogestrel (NEXPLANON) 68 MG IMPL implant, 1 each by Subdermal route once., Disp: , Rfl:  .  FARXIGA 10 MG TABS tablet, Take 10 mg by mouth daily. , Disp: , Rfl: 11 .  ferrous sulfate 325 (65 FE) MG tablet, Take 1 tablet (325 mg total) by mouth daily., Disp: , Rfl:  .  furosemide (LASIX) 40 MG tablet, Take 1 tablet (40 mg total) by mouth every other day., Disp: 30 tablet, Rfl: 5 .  glimepiride (AMARYL) 4 MG tablet, TAKE 1 TABLET BY MOUTH ONCE DAILY (Patient taking differently: Take 4 mg by mouth daily. ), Disp: 90 tablet, Rfl: 1 .  glucose blood (ACCU-CHEK ACTIVE STRIPS) test strip, Use as instructed, Disp: 100 each, Rfl: 12 .  glucose blood (ACCU-CHEK AVIVA) test strip, 1 each by Other route as needed for other. Use as instructed, Disp: , Rfl:  .  insulin aspart (NOVOLOG)  100 UNIT/ML injection, Inject 10 Units into the skin 3 (three) times daily with meals. (Patient taking differently: Inject 6-20 Units into the skin 3 (three) times daily with meals. ), Disp: 10 mL, Rfl: 11 .  insulin glargine (LANTUS) 100 UNIT/ML injection, Inject 0.4 mLs (40 Units total) into the skin at bedtime. (Patient taking differently: Inject 60 Units into the skin at bedtime. ), Disp: 10 mL, Rfl: 11 .  Insulin Pen Needle 32G X 4 MM MISC, 1 Units by Does not apply route every morning. Pen needles, Disp: 90 each, Rfl: 3 .  irbesartan (AVAPRO) 150 MG tablet, Take 150 mg by mouth daily., Disp: , Rfl:  .  isosorbide mononitrate (IMDUR) 30 MG 24 hr tablet, Take 2 tablets (60 mg total) by mouth daily., Disp: 90 tablet, Rfl: 3 .  loperamide (IMODIUM A-D) 2 MG tablet, Take 8-12 mg by mouth daily as needed for diarrhea or loose stools., Disp: , Rfl:  .  meclizine (ANTIVERT) 25 MG tablet, Take 25 mg by mouth 3 (three) times daily as needed for dizziness., Disp: , Rfl:  .  metoprolol tartrate (LOPRESSOR) 50 MG tablet, Take 1 tablet (50 mg total) by mouth 2 (two) times daily., Disp: 180 tablet, Rfl: 3 .  mirtazapine (REMERON) 30 MG tablet, Take 30 mg by mouth daily. , Disp: , Rfl: 3 .  nitroGLYCERIN (  NITROSTAT) 0.4 MG SL tablet, Take 1 tab under your tongue while sitting for chest pain, If no relief may repeat, one tab every 5 min up to 3 tabs total over 15 mins., Disp: 25 tablet, Rfl: 3 .  pantoprazole (PROTONIX) 40 MG tablet, TAKE 1 TABLET BY MOUTH ONCE DAILY AT 6AM (Patient taking differently: Take 40 mg by mouth 2 (two) times daily. ), Disp: 90 tablet, Rfl: 1  Past Medical History: Past Medical History:  Diagnosis Date  . CAD (coronary artery disease)    a. 03/2015 NSTEMI/PCI: LCX 155m(DES), OM1 100p (DES - vessel labeled Ramus in subsequent cath report). Minor irregs to LAD/RCA; b. 04/2015 Cath: LM nl, LAD 40p, RI patent stent, LCX patent stent, RCA nl, EF 55-65%; c. 02/2016 MV: basal inferolateral and  mid inferolateral defect/infarct w/o ischemia. EF 55-65%; d. 11/2018 PCI: LM 30ost, LAD 30p, LCX 95p/m (PTCA->20%), OM1 10ost, OM2 40ost, RCA 85p.  .Marland KitchenChronic diastolic (congestive) heart failure ([facility]    a. 03/2015 Echo: EF 30-35%, mild concentric LVH, severe HK of inf, inflat, & lat walls, mod MR, mild TR;  b. 04/2015 LV Gram: EF 55-65%; c. 09/2017 Echo: EF 55-60%, no rwma. Mild MR; d. 11/2018 Echo: Ef 60-65%, no rwma.  . CKD (chronic kidney disease), stage III ([facility]   . Diabetes type 2, controlled ([facility]    a. since 2002   . Diabetic gastroparesis (HCC)    a. chronic nausea/vomiting.  . Diverticulosis, sigmoid 07/2016  . Heavy menses    a. H/O IUD - expired in 2014 - remains in place.  . Hyperlipidemia   . Hypertension   . Iron deficiency anemia   . Ischemic cardiomyopathy (resolved)    a. 03/2015 EF 30-35% post NSTEMI;  b. 04/2015 EF 55-65% on LV gram; c. 09/2017 Echo: EF 55-60%; d. 11/2018 Echo: Ef 60-65%.  . Obesity   . Tobacco abuse    a. quit 03/2015.  .Marland KitchenUterine fibroid   . Vertigo   . Vitamin D deficiency     Tobacco Use: Social History   Tobacco Use  Smoking Status Former Smoker  . Years: 15.00  . Last attempt to quit: 03/10/2015  . Years since quitting: 3.9  Smokeless Tobacco Never Used    Labs: Recent Review Flowsheet Data    Labs for ITP Cardiac and Pulmonary Rehab Latest Ref Rng & Units 10/27/2017 01/09/2018 01/22/2018 10/06/2018 12/17/2018   Cholestrol 100 - 199 mg/dL - - - 236(H) 245(H)   LDLCALC 0 - 99 mg/dL - - - 163(H) 165(H)   LDLDIRECT 0 - 99 mg/dL - - - - 163(H)   HDL >39 mg/dL - - - 39(L) 53   Trlycerides 0 - 149 mg/dL - - - 171(H) 137   Hemoglobin A1c 4.8 - 5.6 % 11.1(H) 9.6(H) 11.5(H) - -       Exercise Target Goals: Exercise Program Goal: Individual exercise prescription set using results from initial 6 min walk test and THRR while considering  patient's activity barriers and safety.   Exercise Prescription Goal: Initial exercise prescription builds to  30-45 minutes a day of aerobic activity, 2-3 days per week.  Home exercise guidelines will be given to patient during program as part of exercise prescription that the participant will acknowledge.  Activity Barriers & Risk Stratification: Activity Barriers & Cardiac Risk Stratification - 01/21/19 1355      Activity Barriers & Cardiac Risk Stratification   Activity Barriers  Deconditioning;Muscular Weakness;Shortness of Breath;Balance Concerns;Back Problems;Joint Problems;Decreased Ventricular  Function   knees and back can be very painful (chronic), occasional dizzy spells   Cardiac Risk Stratification  High       6 Minute Walk: 6 Minute Walk    Row Name 01/21/19 1354         6 Minute Walk   Phase  Initial     Distance  850 feet     Walk Time  6 minutes     MPH  1.61     METS  3.5     RPE  12     Perceived Dyspnea   1     VO2 Peak  12.25     Symptoms  Yes (comment)     Comments  legs tired and painful 9/10     Resting HR  103 bpm     Resting BP  126/74     Resting Oxygen Saturation   96 %     Exercise Oxygen Saturation  during 6 min walk  97 %     Max Ex. HR  135 bpm     Max Ex. BP  156/74     2 Minute Post BP  130/70        Oxygen Initial Assessment:   Oxygen Re-Evaluation:   Oxygen Discharge (Final Oxygen Re-Evaluation):   Initial Exercise Prescription: Initial Exercise Prescription - 01/21/19 1300      Date of Initial Exercise RX and Referring Provider   Date  01/21/19    Referring Provider  End, Christiopher MD   Attending Cardiologist: Dr. Kathlyn [place]     Treadmill   MPH  1.4    Grade  0.5    Minutes  15    METs  2.17      REL-XR   Level  1    Speed  50    Minutes  15    METs  2.5      T5 Nustep   Level  2    SPM  80    Minutes  15    METs  2.5      Prescription Details   Frequency (times per week)  3    Duration  Progress to 45 minutes of aerobic exercise without signs/symptoms of physical distress      Intensity   THRR 40-80% of  Max Heartrate  135-166    Ratings of Perceived Exertion  11-13    Perceived Dyspnea  0-4      Progression   Progression  Continue to progress workloads to maintain intensity without signs/symptoms of physical distress.      Resistance Training   Training Prescription  Yes    Weight  3 lbs    Reps  10-15       Perform Capillary Blood Glucose checks as needed.  Exercise Prescription Changes: Exercise Prescription Changes    Row Name 01/21/19 1300             Response to Exercise   Blood Pressure (Admit)  126/74       Blood Pressure (Exercise)  156/74       Blood Pressure (Exit)  130/70       Heart Rate (Admit)  103 bpm       Heart Rate (Exercise)  135 bpm       Heart Rate (Exit)  108 bpm       Oxygen Saturation (Admit)  96 %       Oxygen Saturation (Exercise)  97 %  Rating of Perceived Exertion (Exercise)  12       Perceived Dyspnea (Exercise)  1       Symptoms  legs tired and pain 9/10, SOB       Comments  walk test results          Exercise Comments:  Exercise Comments    Row Name 02/01/19 1737 02/23/19 1148         Exercise Comments   First full day of exercise!  Patient was oriented to gym and equipment including functions, settings, policies, and procedures.  Patient's individual exercise prescription and treatment plan were reviewed.  All starting workloads were established based on the results of the 6 minute walk test done at initial orientation visit.  The plan for exercise progression was also introduced and progression will be customized based on patient's performance and goals.  Reviewed home exercise with pt today.  Pt plans to use home gym for exercise.  Reviewed THR, pulse, RPE, sign and symptoms, NTG use, and when to call 911 or MD.  Also discussed weather considerations and indoor options.  Pt voiced understanding.         Exercise Goals and Review: Exercise Goals    Row Name 01/21/19 1406             Exercise Goals   Increase Physical  Activity  Yes       Intervention  Provide advice, education, support and counseling about physical activity/exercise needs.;Develop an individualized exercise prescription for aerobic and resistive training based on initial evaluation findings, risk stratification, comorbidities and participant's personal goals.       Expected Outcomes  Short Term: Attend rehab on a regular basis to increase amount of physical activity.;Long Term: Exercising regularly at least 3-5 days a week.;Long Term: Add in home exercise to make exercise part of routine and to increase amount of physical activity.       Increase Strength and Stamina  Yes       Intervention  Provide advice, education, support and counseling about physical activity/exercise needs.;Develop an individualized exercise prescription for aerobic and resistive training based on initial evaluation findings, risk stratification, comorbidities and participant's personal goals.       Expected Outcomes  Short Term: Increase workloads from initial exercise prescription for resistance, speed, and METs.;Short Term: Perform resistance training exercises routinely during rehab and add in resistance training at home;Long Term: Improve cardiorespiratory fitness, muscular endurance and strength as measured by increased METs and functional capacity (6MWT)       Able to understand and use rate of perceived exertion (RPE) scale  Yes       Intervention  Provide education and explanation on how to use RPE scale       Expected Outcomes  Short Term: Able to use RPE daily in rehab to express subjective intensity level;Long Term:  Able to use RPE to guide intensity level when exercising independently       Knowledge and understanding of Target Heart Rate Range (THRR)  Yes       Intervention  Provide education and explanation of THRR including how the numbers were predicted and where they are located for reference       Expected Outcomes  Short Term: Able to state/look up THRR;Short  Term: Able to use daily as guideline for intensity in rehab;Long Term: Able to use THRR to govern intensity when exercising independently       Able to check pulse independently  Yes  Intervention  Provide education and demonstration on how to check pulse in carotid and radial arteries.;Review the importance of being able to check your own pulse for safety during independent exercise       Expected Outcomes  Short Term: Able to explain why pulse checking is important during independent exercise;Long Term: Able to check pulse independently and accurately       Understanding of Exercise Prescription  Yes       Intervention  Provide education, explanation, and written materials on patient's individual exercise prescription       Expected Outcomes  Short Term: Able to explain program exercise prescription;Long Term: Able to explain home exercise prescription to exercise independently          Exercise Goals Re-Evaluation : Exercise Goals Re-Evaluation    Row Name 02/01/19 1737 02/23/19 1146           Exercise Goal Re-Evaluation   Exercise Goals Review  Increase Physical Activity;Increase Strength and Stamina;Able to understand and use rate of perceived exertion (RPE) scale;Able to understand and use Dyspnea scale  Increase Physical Activity;Increase Strength and Stamina;Able to understand and use rate of perceived exertion (RPE) scale;Knowledge and understanding of Target Heart Rate Range (THRR);Able to check pulse independently;Understanding of Exercise Prescription      Comments  Reviewed RPE scale, THR and program prescription with pt today.  Pt voiced understanding and was given a copy of goals to take home.   Reviewed home exercise with pt today.  Pt plans to use gym at apartment complex for exercise.  Reviewed THR, pulse, RPE, sign and symptoms, NTG use, and when to call 911 or MD.  Also discussed weather considerations and indoor options.  Pt voiced understanding.  HT classes ar enot  meeting at this time due to [place] 19.        Expected Outcomes  Short: Use RPE daily to regulate intensity. Long: Follow program prescription in THR.  Short - exercise on her own while missing classes Long - improve overall MET level         Discharge Exercise Prescription (Final Exercise Prescription Changes): Exercise Prescription Changes - 01/21/19 1300      Response to Exercise   Blood Pressure (Admit)  126/74    Blood Pressure (Exercise)  156/74    Blood Pressure (Exit)  130/70    Heart Rate (Admit)  103 bpm    Heart Rate (Exercise)  135 bpm    Heart Rate (Exit)  108 bpm    Oxygen Saturation (Admit)  96 %    Oxygen Saturation (Exercise)  97 %    Rating of Perceived Exertion (Exercise)  12    Perceived Dyspnea (Exercise)  1    Symptoms  legs tired and pain 9/10, SOB    Comments  walk test results       Nutrition:  Target Goals: Understanding of nutrition guidelines, daily intake of sodium '1500mg'$ , cholesterol '200mg'$ , calories 30% from fat and 7% or less from saturated fats, daily to have 5 or more servings of fruits and vegetables.  Biometrics: Pre Biometrics - 01/21/19 1407      Pre Biometrics   Height  5' 6.4" (1.687 m)    Weight  275 lb 12.8 oz (125.1 kg)    Waist Circumference  50 inches    Hip Circumference  52 inches    Waist to Hip Ratio  0.96 %    BMI (Calculated)  43.96    Single Leg Stand  4.58  seconds        Nutrition Therapy Plan and Nutrition Goals: Nutrition Therapy & Goals - 02/24/19 1241      Nutrition Therapy   Diet  Diabetes Heart Healthy Low Sodium       Nutrition Assessments: Nutrition Assessments - 01/21/19 1429      MEDFICTS Scores   Pre Score  24       Nutrition Goals Re-Evaluation:   Nutrition Goals Discharge (Final Nutrition Goals Re-Evaluation):   Psychosocial: Target Goals: Acknowledge presence or absence of significant depression and/or stress, maximize coping skills, provide positive support system. Participant is able  to verbalize types and ability to use techniques and skills needed for reducing stress and depression.   Initial Review & Psychosocial Screening: Initial Psych Review & Screening - 01/21/19 1426      Initial Review   Current issues with  Current Depression;Current Anxiety/Panic;Current Stress Concerns    Source of Stress Concerns  Chronic Illness;Unable to perform yard/household activities;Unable to participate in former interests or hobbies;Family    Comments  Hawley reports her quality of life is low due to inability to participate in some daily activities and hobbies related to her lack of stamina. She said her husband, who is a Administrator, and her 60 year old daughter are very supportive. She wants to work on her energy level while here      [place]?  Yes   husband, daughter, family     Barriers   Psychosocial barriers to participate in program  There are no identifiable barriers or psychosocial needs.;The patient should benefit from training in stress management and relaxation.      Screening Interventions   Interventions  Encouraged to exercise;Program counselor consult;To provide support and resources with identified psychosocial needs;Provide feedback about the scores to participant    Expected Outcomes  Short Term goal: Utilizing psychosocial counselor, staff and physician to assist with identification of specific Stressors or current issues interfering with healing process. Setting desired goal for each stressor or current issue identified.;Long Term Goal: Stressors or current issues are controlled or eliminated.;Short Term goal: Identification and review with participant of any Quality of Life or Depression concerns found by scoring the questionnaire.;Long Term goal: The participant improves quality of Life and PHQ9 Scores as seen by post scores and/or verbalization of changes       Quality of Life Scores:  Quality of Life - 01/21/19 1428       Quality of Life   Select  Quality of Life      Quality of Life Scores   Health/Function Pre  3.2 %    Socioeconomic Pre  15.43 %    Psych/Spiritual Pre  3.43 %    Family Pre  20.4 %    GLOBAL Pre  8.23 %      Scores of 19 and below usually indicate a poorer quality of life in these areas.  A difference of  2-3 points is a clinically meaningful difference.  A difference of 2-3 points in the total score of the Quality of Life Index has been associated with significant improvement in overall quality of life, self-image, physical symptoms, and general health in studies assessing change in quality of life.  PHQ-9: Recent Review Flowsheet Data    Depression screen [facility] 2/9 01/21/2019 10/06/2018 01/09/2018 08/18/2017 08/12/2017   Decreased Interest 3 0 0 1 2   Down, Depressed, Hopeless '2 1 3 1 2   '$ PHQ - 2  Score '5 1 3 2 4   '$ Altered sleeping '3 1 3 '$ - 3   Tired, decreased energy '3 1 3 '$ - 2   Change in appetite '3 1 3 '$ - 2   Feeling bad or failure about yourself  '3 1 2 '$ - 2   Trouble concentrating '1 1 2 '$ - 2   Moving slowly or fidgety/restless 2 0 0 - 0   Suicidal thoughts 0 0 2 0 0   PHQ-9 Score '20 6 18 '$ - 15   Difficult doing work/chores Extremely dIfficult Very difficult - - -     Interpretation of Total Score  Total Score Depression Severity:  1-4 = Minimal depression, 5-9 = Mild depression, 10-14 = Moderate depression, 15-19 = Moderately severe depression, 20-27 = Severe depression   Psychosocial Evaluation and Intervention: Psychosocial Evaluation - 03/01/19 1043      Psychosocial Evaluation & Interventions   Comments  Chaplain attempted to reach patient to conduct initial psychosocial evaluation.  Message left requesting call back from patient.    Expected Outcomes  Counselor will continue to attempt to reach patient.    Continue Psychosocial Services   Follow up required by counselor       Psychosocial Re-Evaluation:   Psychosocial Discharge (Final Psychosocial  Re-Evaluation):   Vocational Rehabilitation: Provide vocational rehab assistance to qualifying candidates.   Vocational Rehab Evaluation & Intervention: Vocational Rehab - 01/21/19 1426      Initial Vocational Rehab Evaluation & Intervention   Assessment shows need for Vocational Rehabilitation  No       Education: Education Goals: Education classes will be provided on a variety of topics geared toward better understanding of heart health and risk factor modification. Participant will state understanding/return demonstration of topics presented as noted by education test scores.  Learning Barriers/Preferences: Learning Barriers/Preferences - 01/21/19 1425      Learning Barriers/Preferences   Learning Barriers  None    Learning Preferences  None       Education Topics:  AED/CPR: - Group verbal and written instruction with the use of models to demonstrate the basic use of the AED with the basic ABC's of resuscitation.   Cardiac Rehab from 02/01/2019 in [facility] Cardiac and Pulmonary Rehab  Date  02/01/19  Educator  CE  Instruction Review Code  1- Verbalizes Understanding      General Nutrition Guidelines/Fats and Fiber: -Group instruction provided by verbal, written material, models and posters to present the general guidelines for heart healthy nutrition. Gives an explanation and review of dietary fats and fiber.   Controlling Sodium/Reading Food Labels: -Group verbal and written material supporting the discussion of sodium use in heart healthy nutrition. Review and explanation with models, verbal and written materials for utilization of the food label.   Cardiac Rehab from 08/02/2015 in [facility] Cardiac and Pulmonary Rehab  Date  07/31/15  Educator  PI  Instruction Review Code (retired)  2- meets goals/outcomes      Exercise Physiology & General Exercise Guidelines: - Group verbal and written instruction with models to review the exercise physiology of the cardiovascular  system and associated critical values. Provides general exercise guidelines with specific guidelines to those with heart or lung disease.    Aerobic Exercise & Resistance Training: - Gives group verbal and written instruction on the various components of exercise. Focuses on aerobic and resistive training programs and the benefits of this training and how to safely progress through these programs..   Flexibility, Balance, Mind/Body Relaxation: Provides group verbal/written  instruction on the benefits of flexibility and balance training, including mind/body exercise modes such as yoga, pilates and tai chi.  Demonstration and skill practice provided.   Stress and Anxiety: - Provides group verbal and written instruction about the health risks of elevated stress and causes of high stress.  Discuss the correlation between heart/lung disease and anxiety and treatment options. Review healthy ways to manage with stress and anxiety.   Depression: - Provides group verbal and written instruction on the correlation between heart/lung disease and depressed mood, treatment options, and the stigmas associated with seeking treatment.   Cardiac Rehab from 08/02/2015 in [facility] Cardiac and Pulmonary Rehab  Date  08/02/15  Educator  [facility]  Instruction Review Code (retired)  2- meets Designer, fashion/clothing & Physiology of the Heart: - Group verbal and written instruction and models provide basic cardiac anatomy and physiology, with the coronary electrical and arterial systems. Review of Valvular disease and Heart Failure   Cardiac Procedures: - Group verbal and written instruction to review commonly prescribed medications for heart disease. Reviews the medication, class of the drug, and side effects. Includes the steps to properly store meds and maintain the prescription regimen. (beta blockers and nitrates)   Cardiac Medications I: - Group verbal and written instruction to review commonly prescribed  medications for heart disease. Reviews the medication, class of the drug, and side effects. Includes the steps to properly store meds and maintain the prescription regimen.   Cardiac Medications II: -Group verbal and written instruction to review commonly prescribed medications for heart disease. Reviews the medication, class of the drug, and side effects. (all other drug classes)    Go Sex-Intimacy & Heart Disease, Get SMART - Goal Setting: - Group verbal and written instruction through game format to discuss heart disease and the return to sexual intimacy. Provides group verbal and written material to discuss and apply goal setting through the application of the S.M.A.R.T. Method.   Other Matters of the Heart: - Provides group verbal, written materials and models to describe Stable Angina and Peripheral Artery. Includes description of the disease process and treatment options available to the cardiac patient.   Exercise & Equipment Safety: - Individual verbal instruction and demonstration of equipment use and safety with use of the equipment.   Cardiac Rehab from 02/01/2019 in [facility] Cardiac and Pulmonary Rehab  Date  01/21/19  Educator  [facility]  Instruction Review Code  1- Verbalizes Understanding      Infection Prevention: - Provides verbal and written material to individual with discussion of infection control including proper hand washing and proper equipment cleaning during exercise session.   Cardiac Rehab from 02/01/2019 in [facility] Cardiac and Pulmonary Rehab  Date  01/21/19  Educator  [facility]  Instruction Review Code  1- Verbalizes Understanding      Falls Prevention: - Provides verbal and written material to individual with discussion of falls prevention and safety.   Cardiac Rehab from 02/01/2019 in [facility] Cardiac and Pulmonary Rehab  Date  01/21/19  Educator  [facility]  Instruction Review Code  1- Verbalizes Understanding      Diabetes: - Individual verbal and written instruction to  review signs/symptoms of diabetes, desired ranges of glucose level fasting, after meals and with exercise. Acknowledge that pre and post exercise glucose checks will be done for 3 sessions at entry of program.   Cardiac Rehab from 02/01/2019 in [facility] Cardiac and Pulmonary Rehab  Date  01/21/19  Educator  [facility]  Instruction  Review Code  1- Verbalizes Understanding      Know Your Numbers and Risk Factors: -Group verbal and written instruction about important numbers in your health.  Discussion of what are risk factors and how they play a role in the disease process.  Review of Cholesterol, Blood Pressure, Diabetes, and BMI and the role they play in your overall health.   Sleep Hygiene: -Provides group verbal and written instruction about how sleep can affect your health.  Define sleep hygiene, discuss sleep cycles and impact of sleep habits. Review good sleep hygiene tips.    Other: -Provides group and verbal instruction on various topics (see comments)   Knowledge Questionnaire Score: Knowledge Questionnaire Score - 01/21/19 1425      Knowledge Questionnaire Score   Pre Score  24/26   correct answers reviewed with [place]. Focus on PAD and Nutrition       Core Components/Risk Factors/Patient Goals at Admission: Personal Goals and Risk Factors at Admission - 01/21/19 1424      Core Components/Risk Factors/Patient Goals on Admission    Weight Management  Yes;Obesity;Weight Loss    Intervention  Weight Management: Develop a combined nutrition and exercise program designed to reach desired caloric intake, while maintaining appropriate intake of nutrient and fiber, sodium and fats, and appropriate energy expenditure required for the weight goal.;Weight Management: Provide education and appropriate resources to help participant work on and attain dietary goals.;Weight Management/Obesity: Establish reasonable short term and long term weight goals.;Obesity: Provide education and appropriate  resources to help participant work on and attain dietary goals.    Admit Weight  275 lb 12.8 oz (125.1 kg)    Goal Weight: Short Term  270 lb (122.5 kg)    Goal Weight: Long Term  140 lb (63.5 kg)    Expected Outcomes  Short Term: Continue to assess and modify interventions until short term weight is achieved;Long Term: Adherence to nutrition and physical activity/exercise program aimed toward attainment of established weight goal;Weight Loss: Understanding of general recommendations for a balanced deficit meal plan, which promotes 1-2 lb weight loss per week and includes a negative energy balance of [phone] kcal/d;Understanding recommendations for meals to include 15-35% energy as protein, 25-35% energy from fat, 35-60% energy from carbohydrates, less than '200mg'$  of dietary cholesterol, 20-35 gm of total fiber daily;Understanding of distribution of calorie intake throughout the day with the consumption of 4-5 meals/snacks    Diabetes  Yes    Intervention  Provide education about signs/symptoms and action to take for hypo/hyperglycemia.;Provide education about proper nutrition, including hydration, and aerobic/resistive exercise prescription along with prescribed medications to achieve blood glucose in normal ranges: Fasting glucose 65-99 mg/dL    Expected Outcomes  Short Term: Participant verbalizes understanding of the signs/symptoms and immediate care of hyper/hypoglycemia, proper foot care and importance of medication, aerobic/resistive exercise and nutrition plan for blood glucose control.;Long Term: Attainment of HbA1C < 7%.    Heart Failure  Yes    Intervention  Provide a combined exercise and nutrition program that is supplemented with education, support and counseling about heart failure. Directed toward relieving symptoms such as shortness of breath, decreased exercise tolerance, and extremity edema.    Expected Outcomes  Improve functional capacity of life;Short term: Attendance in program 2-3  days a week with increased exercise capacity. Reported lower sodium intake. Reported increased fruit and vegetable intake. Reports medication compliance.;Short term: Daily weights obtained and reported for increase. Utilizing diuretic protocols set by physician.;Long term: Adoption of self-care skills and  reduction of barriers for early signs and symptoms recognition and intervention leading to self-care maintenance.    Hypertension  Yes    Intervention  Provide education on lifestyle modifcations including regular physical activity/exercise, weight management, moderate sodium restriction and increased consumption of fresh fruit, vegetables, and low fat dairy, alcohol moderation, and smoking cessation.;Monitor prescription use compliance.    Expected Outcomes  Short Term: Continued assessment and intervention until BP is < 140/63m HG in hypertensive participants. < 130/848mHG in hypertensive participants with diabetes, heart failure or chronic kidney disease.;Long Term: Maintenance of blood pressure at goal levels.    Lipids  Yes    Intervention  Provide education and support for participant on nutrition & aerobic/resistive exercise along with prescribed medications to achieve LDL '70mg'$ , HDL >'40mg'$ .    Expected Outcomes  Short Term: Participant states understanding of desired cholesterol values and is compliant with medications prescribed. Participant is following exercise prescription and nutrition guidelines.;Long Term: Cholesterol controlled with medications as prescribed, with individualized exercise RX and with personalized nutrition plan. Value goals: LDL < '70mg'$ , HDL > 40 mg.       Core Components/Risk Factors/Patient Goals Review:  Goals and Risk Factor Review    Row Name 02/23/19 1149             Core Components/Risk Factors/Patient Goals Review   Personal Goals Review  Diabetes;Hypertension;Other       Review  FeTakarias an infusion tomorrow due to anemia.  She is taking all meds as  directed and checking BG and BP at home.  They have been in normal range.  She is walking up and down stairs at her home foe exercise.  She states she has a lot of pain but wants to move.  She will consult with Dr tomorrow about level of exercise considering infusion.       Expected Outcomes  Short - check with Dr about exercise due to infusion Long - improve exercise level          Core Components/Risk Factors/Patient Goals at Discharge (Final Review):  Goals and Risk Factor Review - 02/23/19 1149      Core Components/Risk Factors/Patient Goals Review   Personal Goals Review  Diabetes;Hypertension;Other    Review  FeAldeenas an infusion tomorrow due to anemia.  She is taking all meds as directed and checking BG and BP at home.  They have been in normal range.  She is walking up and down stairs at her home foe exercise.  She states she has a lot of pain but wants to move.  She will consult with Dr tomorrow about level of exercise considering infusion.    Expected Outcomes  Short - check with Dr about exercise due to infusion Long - improve exercise level       ITP Comments: ITP Comments    Row Name 02/03/19 0620 02/26/19 0809 03/03/19 1304       ITP Comments  30 day review. Continue with ITP unless directed changes by Medical Director chart review. New to program  Our program is currently closed due to COVID-19.  We are communicating with patient via phone calls and emails.   30 day review. Continue with ITP unless directed changes by Medical Director chart review.         Comments:

## 2019-03-10 ENCOUNTER — Telehealth: Payer: Self-pay | Admitting: *Deleted

## 2019-03-10 NOTE — Telephone Encounter (Signed)
Received fax from [employer] that the [place] patch registered to this patient has not made it back to [employer]. They will mark the device as "lost" in the system if they do not arrive by the 45th day of activation.  Patient did not answer when attempted to call. Left detailed message that calling to see about the [employer]IO patch.

## 2019-03-11 NOTE — Telephone Encounter (Signed)
Attempted to reach patient again and no answer. Left message summarizing what Pam had left as well.

## 2019-03-11 NOTE — Telephone Encounter (Signed)
Left voicemail message that we need her to please mail monitor back into company so that we can have it downloaded. Advised that if she does not have the box then we have one available or she can drop it off at the office. Instructed her to please give [place] a call back and if we call and it shows no caller ID or unkown caller to please try and answer.

## 2019-03-15 NOTE — Telephone Encounter (Signed)
Left detailed voicemail message that we need her to mail monitor back so that they can download the data on it. Advised that if she has any problems or needs a new box to please give [place] a call.

## 2019-03-15 NOTE — Telephone Encounter (Signed)
Spoke with patients spouse per release form and requested that he please have his wife mail monitor back so they can download. He reported that he would make her aware of this and he thought she had already sent it back. Let him know that we still don't have report of it and to please call and let us know if any further questions.

## 2019-03-22 DIAGNOSIS — R002 Palpitations: Secondary | ICD-10-CM | POA: Diagnosis not present

## 2019-03-25 NOTE — Telephone Encounter (Signed)
Pt called to schedule appt/attempted office but no answer/  please call Pt to schedule

## 2019-03-26 NOTE — Telephone Encounter (Signed)
Called pt no answer will try again

## 2019-03-30 ENCOUNTER — Telehealth: Payer: Self-pay

## 2019-03-30 NOTE — Telephone Encounter (Signed)
Call attempted to give results from long term monitor. No answer, LMTCB.

## 2019-03-30 NOTE — Telephone Encounter (Signed)
Incoming call from patient. I reviewed results from zio monitor. She verbalized understanding and had no further questions at this time. No further orders at this times.   Advised pt to call for any further questions or concerns.

## 2019-03-30 NOTE — Telephone Encounter (Signed)
LVM for pt to call back to schedule appt

## 2019-03-30 NOTE — Telephone Encounter (Signed)
-----   Message from Rise Mu, [place] sent at 03/29/2019  4:24 PM EDT ----- Cardiac monitor showed normal sinus rhythm with an average heart rate of 94 bpm with intermittent sinus tachycardia.  Patient triggered events did not correlate with significant arrhythmia.  Rare PVCs were noted.  Overall reassuring.

## 2019-03-31 ENCOUNTER — Telehealth: Payer: Self-pay

## 2019-04-01 NOTE — Progress Notes (Signed)
LMOM

## 2019-04-02 ENCOUNTER — Ambulatory Visit: Payer: Medicare Other | Admitting: Pharmacist

## 2019-04-02 DIAGNOSIS — E1165 Type 2 diabetes mellitus with hyperglycemia: Secondary | ICD-10-CM

## 2019-04-02 DIAGNOSIS — I130 Hypertensive heart and chronic kidney disease with heart failure and stage 1 through stage 4 chronic kidney disease, or unspecified chronic kidney disease: Secondary | ICD-10-CM

## 2019-04-02 DIAGNOSIS — F322 Major depressive disorder, single episode, severe without psychotic features: Secondary | ICD-10-CM

## 2019-04-02 DIAGNOSIS — I10 Essential (primary) hypertension: Secondary | ICD-10-CM

## 2019-04-02 DIAGNOSIS — N183 Chronic kidney disease, stage 3 unspecified: Secondary | ICD-10-CM

## 2019-04-02 DIAGNOSIS — E1122 Type 2 diabetes mellitus with diabetic chronic kidney disease: Secondary | ICD-10-CM

## 2019-04-02 NOTE — Chronic Care Management (AMB) (Signed)
  Chronic Care Management   Note  04/02/2019 Name: Leoda Smithhart MRN: [id number] DOB: 05/28/1981   Subjective:  Patient is a 38 year old female followed by Marnee Guarneri, NP for primary care, referred to chronic care management for support in managing diabetes, heart failure, CKD, CAD, gastroparesis, neuropathy, depression.   Contacted patient telephonically today to explain CCM program.   Ms. Bango was given information about Chronic Care Management services today including:  1. CCM service includes personalized support from designated clinical staff supervised by her physician, including individualized plan of care and coordination with other care providers 2. 24/7 contact phone numbers for assistance for urgent and routine care needs. 3. Service will only be billed when office clinical staff spend 20 minutes or more in a month to coordinate care. 4. Only one practitioner may furnish and bill the service in a calendar month. 5. The patient may stop CCM services at any time (effective at the end of the month) by phone call to the office staff. 6. The patient will be responsible for cost sharing (co-pay) of up to 20% of the service fee (after annual deductible is met).  Patient agreed to services and verbal consent obtained.   Review of patient status, including review of consultants reports, laboratory and other test data, was performed as part of comprehensive evaluation and provision of chronic care management services.    Plan: - Scheduled medication review and management evaluation appointment for next week  Catie Darnelle Maffucci, PharmD Clinical Pharmacist [place] [phone]

## 2019-04-02 NOTE — Patient Instructions (Signed)
Visit Information  [name] [name] was given information about Chronic Care Management services today including:  1. CCM service includes personalized support from designated clinical staff supervised by her physician, including individualized plan of care and coordination with other care providers 2. 24/7 contact phone numbers for assistance for urgent and routine care needs. 3. Service will only be billed when office clinical staff spend 20 minutes or more in a month to coordinate care. 4. Only one practitioner may furnish and bill the service in a calendar month. 5. The patient may stop CCM services at any time (effective at the end of the month) by phone call to the office staff. 6. The patient will be responsible for cost sharing (co-pay) of up to 20% of the service fee (after annual deductible is met).  Patient agreed to services and verbal consent obtained.   The patient verbalized understanding of instructions provided today and declined a print copy of patient instruction materials.   Plan: - Scheduled medication review and management evaluation appointment for next week  Catie Darnelle Maffucci, PharmD Clinical Pharmacist [place] [phone]

## 2019-04-05 ENCOUNTER — Telehealth: Payer: Self-pay | Admitting: Nurse Practitioner

## 2019-04-05 NOTE — Telephone Encounter (Signed)
Called pt to reschedule Thursday's appt due to [facility] not being in his week, no answer, left voicemail.

## 2019-04-07 ENCOUNTER — Telehealth: Payer: Self-pay

## 2019-04-08 ENCOUNTER — Ambulatory Visit: Payer: Medicare Other | Admitting: Nurse Practitioner

## 2019-04-12 NOTE — Progress Notes (Signed)
LMOM

## 2019-04-14 ENCOUNTER — Telehealth: Payer: Self-pay

## 2019-04-16 ENCOUNTER — Emergency Department: Payer: Medicare Other

## 2019-04-16 ENCOUNTER — Ambulatory Visit: Payer: Self-pay | Admitting: Pharmacist

## 2019-04-16 ENCOUNTER — Other Ambulatory Visit: Payer: Self-pay

## 2019-04-16 ENCOUNTER — Emergency Department
Admission: EM | Admit: 2019-04-16 | Discharge: 2019-04-16 | Disposition: A | Discharge status: Home / Self Care | Payer: Medicare Other | Attending: Emergency Medicine | Admitting: Emergency Medicine

## 2019-04-16 ENCOUNTER — Telehealth: Payer: Medicare Other

## 2019-04-16 DIAGNOSIS — I5032 Chronic diastolic (congestive) heart failure: Secondary | ICD-10-CM | POA: Insufficient documentation

## 2019-04-16 DIAGNOSIS — I10 Essential (primary) hypertension: Secondary | ICD-10-CM | POA: Diagnosis not present

## 2019-04-16 DIAGNOSIS — I130 Hypertensive heart and chronic kidney disease with heart failure and stage 1 through stage 4 chronic kidney disease, or unspecified chronic kidney disease: Secondary | ICD-10-CM | POA: Diagnosis not present

## 2019-04-16 DIAGNOSIS — Z79899 Other long term (current) drug therapy: Secondary | ICD-10-CM | POA: Diagnosis not present

## 2019-04-16 DIAGNOSIS — R1033 Periumbilical pain: Secondary | ICD-10-CM | POA: Diagnosis not present

## 2019-04-16 DIAGNOSIS — E1122 Type 2 diabetes mellitus with diabetic chronic kidney disease: Secondary | ICD-10-CM | POA: Diagnosis not present

## 2019-04-16 DIAGNOSIS — R1084 Generalized abdominal pain: Secondary | ICD-10-CM | POA: Diagnosis not present

## 2019-04-16 DIAGNOSIS — N183 Chronic kidney disease, stage 3 unspecified: Secondary | ICD-10-CM

## 2019-04-16 DIAGNOSIS — R109 Unspecified abdominal pain: Secondary | ICD-10-CM | POA: Diagnosis not present

## 2019-04-16 DIAGNOSIS — Z87891 Personal history of nicotine dependence: Secondary | ICD-10-CM | POA: Insufficient documentation

## 2019-04-16 DIAGNOSIS — Z7901 Long term (current) use of anticoagulants: Secondary | ICD-10-CM | POA: Insufficient documentation

## 2019-04-16 DIAGNOSIS — R0689 Other abnormalities of breathing: Secondary | ICD-10-CM | POA: Diagnosis not present

## 2019-04-16 DIAGNOSIS — R1115 Cyclical vomiting syndrome unrelated to migraine: Secondary | ICD-10-CM | POA: Insufficient documentation

## 2019-04-16 DIAGNOSIS — Z794 Long term (current) use of insulin: Secondary | ICD-10-CM | POA: Diagnosis not present

## 2019-04-16 DIAGNOSIS — E1165 Type 2 diabetes mellitus with hyperglycemia: Secondary | ICD-10-CM

## 2019-04-16 DIAGNOSIS — R52 Pain, unspecified: Secondary | ICD-10-CM | POA: Diagnosis not present

## 2019-04-16 DIAGNOSIS — R0902 Hypoxemia: Secondary | ICD-10-CM | POA: Diagnosis not present

## 2019-04-16 LAB — COMPREHENSIVE METABOLIC PANEL
ALT: 38 U/L (ref 0–44)
AST: 29 U/L (ref 15–41)
Albumin: 3 g/dL — ABNORMAL LOW (ref 3.5–5.0)
Alkaline Phosphatase: 336 U/L — ABNORMAL HIGH (ref 38–126)
Anion gap: 11 (ref 5–15)
BUN: 23 mg/dL — ABNORMAL HIGH (ref 6–20)
CO2: 21 mmol/L — ABNORMAL LOW (ref 22–32)
Calcium: 8.3 mg/dL — ABNORMAL LOW (ref 8.9–10.3)
Chloride: 107 mmol/L (ref 98–111)
Creatinine, Ser: 1.26 mg/dL — ABNORMAL HIGH (ref 0.44–1.00)
GFR calc Af Amer: >60 mL/min (ref >60)
GFR calc non Af Amer: 54 mL/min — ABNORMAL LOW (ref >60)
Glucose, Bld: 217 mg/dL — ABNORMAL HIGH (ref 70–99)
Potassium: 3.7 mmol/L (ref 3.5–5.1)
Sodium: 139 mmol/L (ref 135–145)
Total Bilirubin: 0.4 mg/dL (ref 0.3–1.2)
Total Protein: 6.8 g/dL (ref 6.5–8.1)

## 2019-04-16 LAB — URINALYSIS, COMPLETE (UACMP) WITH MICROSCOPIC
Bacteria, UA: NONE SEEN
Bilirubin Urine: NEGATIVE
Glucose, UA: >=500 mg/dL — AB
Ketones, ur: NEGATIVE mg/dL
Nitrite: NEGATIVE
Protein, ur: >=300 mg/dL — AB
Specific Gravity, Urine: 1.02 (ref 1.005–1.030)
pH: 7 (ref 5.0–8.0)

## 2019-04-16 LAB — URINE DRUG SCREEN, QUALITATIVE (ARMC ONLY)
Amphetamines, Ur Screen: NOT DETECTED
Barbiturates, Ur Screen: NOT DETECTED
Benzodiazepine, Ur Scrn: NOT DETECTED
Cannabinoid 50 Ng, Ur ~~LOC~~: POSITIVE — AB
Cocaine Metabolite,Ur ~~LOC~~: NOT DETECTED
MDMA (Ecstasy)Ur Screen: NOT DETECTED
Methadone Scn, Ur: NOT DETECTED
Opiate, Ur Screen: NOT DETECTED
Phencyclidine (PCP) Ur S: NOT DETECTED
Tricyclic, Ur Screen: NOT DETECTED

## 2019-04-16 LAB — TROPONIN I: Troponin I: <0.03 ng/mL (ref <0.03)

## 2019-04-16 LAB — BLOOD GAS, VENOUS
Acid-Base Excess: 2.3 mmol/L — ABNORMAL HIGH (ref 0.0–2.0)
Bicarbonate: 23.3 mmol/L (ref 20.0–28.0)
O2 Saturation: 90.3 %
Patient temperature: 37
pCO2, Ven: 26 mmHg — ABNORMAL LOW (ref 44.0–60.0)
pH, Ven: 7.56 — ABNORMAL HIGH (ref 7.250–7.430)
pO2, Ven: 50 mmHg — ABNORMAL HIGH (ref 32.0–45.0)

## 2019-04-16 LAB — CBC WITH DIFFERENTIAL/PLATELET
Abs Immature Granulocytes: 0.04 10*3/uL (ref 0.00–0.07)
Basophils Absolute: 0.1 10*3/uL (ref 0.0–0.1)
Basophils Relative: 1 %
Eosinophils Absolute: 0.2 10*3/uL (ref 0.0–0.5)
Eosinophils Relative: 2 %
HCT: 37.6 % (ref 36.0–46.0)
Hemoglobin: 12.1 g/dL (ref 12.0–15.0)
Immature Granulocytes: 0 %
Lymphocytes Relative: 19 %
Lymphs Abs: 1.8 10*3/uL (ref 0.7–4.0)
MCH: 25.9 pg — ABNORMAL LOW (ref 26.0–34.0)
MCHC: 32.2 g/dL (ref 30.0–36.0)
MCV: 80.5 fL (ref 80.0–100.0)
Monocytes Absolute: 0.4 10*3/uL (ref 0.1–1.0)
Monocytes Relative: 4 %
Neutro Abs: 7.3 10*3/uL (ref 1.7–7.7)
Neutrophils Relative %: 74 %
Platelets: 433 10*3/uL — ABNORMAL HIGH (ref 150–400)
RBC: 4.67 MIL/uL (ref 3.87–5.11)
RDW: 17.2 % — ABNORMAL HIGH (ref 11.5–15.5)
WBC: 9.7 10*3/uL (ref 4.0–10.5)
nRBC: 0 % (ref 0.0–0.2)

## 2019-04-16 LAB — BRAIN NATRIURETIC PEPTIDE: B Natriuretic Peptide: 50 pg/mL (ref 0.0–100.0)

## 2019-04-16 LAB — LIPASE, BLOOD: Lipase: 40 U/L (ref 11–51)

## 2019-04-16 LAB — POCT PREGNANCY, URINE: Preg Test, Ur: NEGATIVE

## 2019-04-16 LAB — ETHANOL: Alcohol, Ethyl (B): <10 mg/dL (ref <10)

## 2019-04-16 MED ORDER — ALUM & MAG HYDROXIDE-SIMETH 200-200-20 MG/5ML PO SUSP
30.0000 mL | Freq: Once | ORAL | Status: AC
  Administered 2019-04-16: 30 mL via ORAL
  Filled 2019-04-16: qty 30

## 2019-04-16 MED ORDER — SODIUM CHLORIDE 0.9 % IV BOLUS: 500.0000 mL | Freq: Once | INTRAVENOUS | Status: DC

## 2019-04-16 MED ORDER — HALOPERIDOL LACTATE 5 MG/ML IJ SOLN
5.0000 mg | Freq: Once | INTRAMUSCULAR | Status: AC
  Administered 2019-04-16: 21:00:00 5 mg via INTRAVENOUS
  Filled 2019-04-16: qty 1

## 2019-04-16 MED ORDER — CEPHALEXIN 500 MG PO CAPS: 500.0000 mg | ORAL_CAPSULE | Freq: Three times a day (TID) | ORAL | 0 refills | Status: AC

## 2019-04-16 MED ORDER — ONDANSETRON HCL 4 MG PO TABS: 4.0000 mg | ORAL_TABLET | Freq: Three times a day (TID) | ORAL | 0 refills | Status: DC | PRN

## 2019-04-16 MED ORDER — FENTANYL CITRATE (PF) 100 MCG/2ML IJ SOLN
50.0000 ug | Freq: Once | INTRAMUSCULAR | Status: AC
  Administered 2019-04-16: 21:00:00 50 ug via INTRAVENOUS
  Filled 2019-04-16: qty 2

## 2019-04-16 NOTE — ED Provider Notes (Addendum)
[facility] Emergency Department Provider Note  ____________________________________________   I have reviewed the triage vital signs and the nursing notes. Where available I have reviewed prior notes and, if possible and indicated, outside hospital notes.    HISTORY  Chief Complaint Abdominal Pain; Nausea; and Emesis    HPI [name] [name] is a 38 y.o. female who presents today complaining of diffuse abdominal pain mostly periumbilical which has been there "today and always".  Patient states "it always hurts".  She does have a history of documented gastroparesis, she states that she has been vomiting today and she denies diarrhea.  No fever no chest pain no shortness of breath.  Wiggling and yelling in the bed, occasionally making vomiting noises but not actually throwing up.  History is limited therefore by present presentation patient does have a history of ACS but she denies any coronary symptoms.  She does have a history of repeat admissions and visits for this kind of vomiting syndrome.  She has a history of CKD morbid obesity history of tobacco abuse history of stents history of low EF which actually has normalized on the last echo that I can find, history of chronic nausea and vomiting which is what she states is happening today, history of poorly controlled diabetes mellitus.   Past Medical History:  Diagnosis Date  . CAD (coronary artery disease)    a. 03/2015 NSTEMI/PCI: LCX 158m (DES), OM1 100p (DES - vessel labeled Ramus in subsequent cath report). Minor irregs to LAD/RCA; b. 04/2015 Cath: LM nl, LAD 40p, RI patent stent, LCX patent stent, RCA nl, EF 55-65%; c. 02/2016 MV: basal inferolateral and mid inferolateral defect/infarct w/o ischemia. EF 55-65%; d. 11/2018 PCI: LM 30ost, LAD 30p, LCX 95p/m (PTCA->20%), OM1 10ost, OM2 40ost, RCA 85p.  Marland Kitchen Chronic diastolic (congestive) heart failure ([place])    a. 03/2015 Echo: EF 30-35%, mild concentric LVH, severe HK of inf,  inflat, & lat walls, mod MR, mild TR;  b. 04/2015 LV Gram: EF 55-65%; c. 09/2017 Echo: EF 55-60%, no rwma. Mild MR; d. 11/2018 Echo: Ef 60-65%, no rwma.  . CKD (chronic kidney disease), stage III ([place])   . Diabetes type 2, controlled ([place])    a. since 2002   . Diabetic gastroparesis (HCC)    a. chronic nausea/vomiting.  . Diverticulosis, sigmoid 07/2016  . Heavy menses    a. H/O IUD - expired in 2014 - remains in place.  . Hyperlipidemia   . Hypertension   . Iron deficiency anemia   . Ischemic cardiomyopathy (resolved)    a. 03/2015 EF 30-35% post NSTEMI;  b. 04/2015 EF 55-65% on LV gram; c. 09/2017 Echo: EF 55-60%; d. 11/2018 Echo: Ef 60-65%.  . Obesity   . Tobacco abuse    a. quit 03/2015.  Marland Kitchen Uterine fibroid   . Vertigo   . Vitamin D deficiency     Patient Active Problem List   Diagnosis Date Noted  . Anemia 01/14/2019  . Stable angina ([place]) 01/12/2019  . Thrombocytosis ([place]) 01/05/2019  . Coronary artery disease of native artery of native heart with stable angina pectoris ([place]) 12/17/2018  . Unstable angina ([place]) 12/04/2018  . Diabetic gastroparesis associated with type 2 diabetes mellitus ([place]) 05/02/2018  . CKD stage 3 due to type 2 diabetes mellitus ([place]) 04/06/2018  . Poorly controlled type 2 diabetes mellitus ([place]) 08/27/2017  . Lung nodule < 6cm on CT 08/12/2017  . Depression, major, single episode, severe ([place]) 08/12/2017  . Chronic abdominal  pain 08/03/2017  . Obstructive sleep apnea 05/16/2017  . Neuropathy 01/17/2017  . Vitamin D deficiency 10/25/2016  . Insomnia 10/25/2016  . 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 Elevated liver function tests 06/05/2016  . Chronic diastolic heart failure ([place]) 03/19/2016  . Proteinuria 08/30/2015  . CAD in native artery   . Angina pectoris ([place])   . Iron deficiency anemia   . Heavy menses   . Hyperlipidemia associated with type 2 diabetes mellitus ([place]) 04/13/2015  . Ischemic  cardiomyopathy   . Obesity     Past Surgical History:  Procedure Laterality Date  . APPENDECTOMY    . CARDIAC CATHETERIZATION  4/16   x2 stent [facility]  . CARDIAC CATHETERIZATION N/A 05/05/2015   Procedure: Left Heart Cath and Coronary Angiography;  Surgeon: Peter M Martinique, MD;  Location: [place] [facility];  Service: Cardiovascular;  Laterality: N/A;  . COLONOSCOPY WITH PROPOFOL N/A 07/31/2016   Procedure: COLONOSCOPY WITH PROPOFOL;  Surgeon: Lollie Sails, MD;  Location: [facility] ENDOSCOPY;  Service: Endoscopy;  Laterality: N/A;  . CORONARY BALLOON ANGIOPLASTY N/A 12/04/2018   Procedure: CORONARY BALLOON ANGIOPLASTY;  Surgeon: Wellington Hampshire, MD;  Location: [place] [facility];  Service: Cardiovascular;  Laterality: N/A;  . CORONARY STENT INTERVENTION N/A 01/12/2019   Procedure: CORONARY STENT INTERVENTION;  Surgeon: [name], MD;  Location: [place] [facility];  Service: Cardiovascular;  Laterality: N/A;  . ESOPHAGOGASTRODUODENOSCOPY (EGD) WITH PROPOFOL N/A 07/31/2016   Procedure: ESOPHAGOGASTRODUODENOSCOPY (EGD) WITH PROPOFOL;  Surgeon: Lollie Sails, MD;  Location: [facility] ENDOSCOPY;  Service: Endoscopy;  Laterality: N/A;  . INTRAVASCULAR PRESSURE WIRE/FFR STUDY N/A 12/04/2018   Procedure: INTRAVASCULAR PRESSURE WIRE/FFR STUDY;  Surgeon: Wellington Hampshire, MD;  Location: [place] [facility];  Service: Cardiovascular;  Laterality: N/A;  . LAPAROSCOPIC APPENDECTOMY N/A 08/07/2016   Procedure: APPENDECTOMY LAPAROSCOPIC;  Surgeon: Hubbard Robinson, MD;  Location: [facility] ORS;  Service: General;  Laterality: N/A;  . LEFT HEART CATH AND CORONARY ANGIOGRAPHY Left 12/04/2018   Procedure: LEFT HEART CATH AND CORONARY ANGIOGRAPHY;  Surgeon: Wellington Hampshire, MD;  Location: [place] [facility];  Service: Cardiovascular;  Laterality: Left;  . LEFT HEART CATH AND CORONARY ANGIOGRAPHY N/A 01/12/2019   Procedure: LEFT HEART CATH AND CORONARY ANGIOGRAPHY;  Surgeon: [name], MD;  Location:  [place] [facility];  Service: Cardiovascular;  Laterality: N/A;  . MOUTH SURGERY      Prior to Admission medications   Medication Sig Start Date End Date Taking? Authorizing Provider  aspirin EC 81 MG tablet Take 81 mg by mouth daily.    [provider]  benzonatate (TESSALON) 100 MG capsule Take 1 capsule (100 mg total) by mouth 2 (two) times daily as needed for cough. 01/15/19   Marnee Guarneri T, NP  Cholecalciferol 2000 units TABS Take 2,000 Units by mouth daily.     [provider]  clopidogrel (PLAVIX) 75 MG tablet Take 4 tabs at 7pm tonight (12/28), then 1 tab daily beginning 12/29. Patient taking differently: Take 75 mg by mouth daily.  12/05/18   Theora Gianotti, NP  DULoxetine (CYMBALTA) 30 MG capsule Take 60 mg by mouth daily.  07/01/18   [provider]  etonogestrel (NEXPLANON) 68 MG IMPL implant 1 each by Subdermal route once.    [provider]  FARXIGA 10 MG TABS tablet Take 10 mg by mouth daily.  09/08/18   [provider]  ferrous  sulfate 325 (65 FE) MG tablet Take 1 tablet (325 mg total) by mouth daily. 05/04/18   Loletha Grayer, MD  furosemide (LASIX) 40 MG tablet Take 1 tablet (40 mg total) by mouth every other day. 01/19/19 07/18/19  End, Harrell Gave, MD  glimepiride (AMARYL) 4 MG tablet TAKE 1 TABLET BY MOUTH ONCE DAILY Patient taking differently: Take 4 mg by mouth daily.  10/21/18   Marnee Guarneri T, NP  glucose blood (ACCU-CHEK ACTIVE STRIPS) test strip Use as instructed 02/11/18   Volney American, PA-C  glucose blood (ACCU-CHEK AVIVA) test strip 1 each by Other route as needed for other. Use as instructed    [provider]  insulin aspart (NOVOLOG) 100 UNIT/ML injection Inject 10 Units into the skin 3 (three) times daily with meals. Patient taking differently: Inject 6-20 Units into the skin 3 (three) times daily with meals.  05/04/18   Loletha Grayer, MD  insulin glargine (LANTUS) 100 UNIT/ML  injection Inject 0.4 mLs (40 Units total) into the skin at bedtime. Patient taking differently: Inject 60 Units into the skin at bedtime.  05/04/18   Wieting, Richard, MD  Insulin Pen Needle 32G X 4 MM MISC 1 Units by Does not apply route every morning. Pen needles 12/24/18   Cannady, Henrine Screws T, NP  irbesartan (AVAPRO) 150 MG tablet Take 150 mg by mouth daily.    [provider]  isosorbide mononitrate (IMDUR) 30 MG 24 hr tablet Take 2 tablets (60 mg total) by mouth daily. 12/17/18   Rise Mu, PA-C  loperamide (IMODIUM A-D) 2 MG tablet Take 8-12 mg by mouth daily as needed for diarrhea or loose stools.    [provider]  meclizine (ANTIVERT) 25 MG tablet Take 25 mg by mouth 3 (three) times daily as needed for dizziness.    [provider]  metoprolol tartrate (LOPRESSOR) 50 MG tablet Take 1 tablet (50 mg total) by mouth 2 (two) times daily. 06/09/18   Theora Gianotti, NP  mirtazapine (REMERON) 30 MG tablet Take 30 mg by mouth daily.  08/12/18   [provider]  nitroGLYCERIN (NITROSTAT) 0.4 MG SL tablet Take 1 tab under your tongue while sitting for chest pain, If no relief may repeat, one tab every 5 min up to 3 tabs total over 15 mins. 06/09/18   Theora Gianotti, NP  pantoprazole (PROTONIX) 40 MG tablet TAKE 1 TABLET BY MOUTH ONCE DAILY AT 6AM Patient taking differently: Take 40 mg by mouth 2 (two) times daily.  10/21/18   Venita Lick, NP    Allergies Lisinopril; Rosemary oil; Shellfish allergy; Other; and Metformin and related  Family History  Problem Relation Age of Onset  . Diabetes Father   . Cancer Maternal Grandmother        lung  . Cancer Maternal Grandfather        prostate  . Diabetes Paternal Grandfather   . Diabetes Paternal Grandmother   . Cancer - Cervical Maternal Aunt     Social History Social History   Tobacco Use  . Smoking status: Former Smoker    Years: 15.00    Last attempt to quit: 03/10/2015    Years  since quitting: 4.1  . Smokeless tobacco: Never Used  Substance Use Topics  . Alcohol use: No    Alcohol/week: 0.0 standard drinks  . Drug use: No    Review of Systems Constitutional: No fever/chills Eyes: No visual changes. ENT: No sore throat. No stiff neck no neck  pain Cardiovascular: Denies chest pain. Respiratory: Denies shortness of breath. Gastrointestinal:   See HPI genitourinary: Negative for dysuria. Musculoskeletal: Negative lower extremity swelling Skin: Negative for rash. Neurological: Negative for severe headaches, focal weakness or numbness.   ____________________________________________   PHYSICAL EXAM:  VITAL SIGNS: ED Triage Vitals  Enc Vitals Group     BP 04/16/19 2030 (!) 164/118     Pulse Rate 04/16/19 2030 96     Resp 04/16/19 2030 20     Temp 04/16/19 2030 97.8 F (36.6 C)     Temp Source 04/16/19 2030 Oral     SpO2 04/16/19 2030 100 %     Weight 04/16/19 2027 280 lb (127 kg)     Height 04/16/19 2027 5\' 5"  (1.651 m)     Head Circumference --      Peak Flow --      Pain Score 04/16/19 2027 10     Pain Loc --      Pain Edu? --      Excl. in [place]? --     Constitutional: Wiggling in the bed, but nontoxic otherwise medically speaking.  Making loud noises that approximate vomiting.  No actual vomiting noted yet.  Patient is very anxious and hyperventilating Eyes: Conjunctivae are normal Head: Atraumatic HEENT: No congestion/rhinnorhea. Mucous membranes are moist.  Oropharynx non-erythematous Neck:   Nontender with no meningismus, no masses, no stridor Cardiovascular: Normal rate, regular rhythm. Grossly normal heart sounds.  Good peripheral circulation. Respiratory: Normal respiratory effort.  No retractions. Lungs CTAB.  Patient is very anxious and hyperventilating Abdominal: Soft and Lee obese, minimal periumbilical epigastric discomfort noted distractible. No distention. No guarding no rebound Back:  There is no focal tenderness or step off.   there is no midline tenderness there are no lesions noted. there is no CVA tenderness  Musculoskeletal: No lower extremity tenderness, no upper extremity tenderness. No joint effusions, no DVT signs strong distal pulses no edema Neurologic:  Normal speech and language. No gross focal neurologic deficits are appreciated.  Skin:  Skin is warm, dry and intact. No rash noted. Psychiatric: Mood and affect are very anxious. Speech and behavior are normal.  ____________________________________________   LABS (all labs ordered are listed, but only abnormal results are displayed)  Labs Reviewed  BLOOD GAS, VENOUS  COMPREHENSIVE METABOLIC PANEL  CBC WITH DIFFERENTIAL/PLATELET  ETHANOL  URINE DRUG SCREEN, QUALITATIVE ([facility] ONLY)  URINALYSIS, COMPLETE ([facility]) WITH MICROSCOPIC  BRAIN NATRIURETIC PEPTIDE  TROPONIN I  I-STAT BETA HCG BLOOD, ED ([facility], [facility], [facility] ONLY)    Pertinent labs  results that were available during my care of the patient were reviewed by me and considered in my medical decision making (see chart for details). ____________________________________________  EKG  I personally interpreted any EKGs ordered by me or triage Sinus rhythm rate 97 normal axis unremarkable EKG no acute ST elevation or depression nonspecific ST changes.  EKG is unchanged from prior EKGs particularly in the anterior V1 V2 leads. ____________________________________________  RADIOLOGY  Pertinent labs & imaging results that were available during my care of the patient were reviewed by me and considered in my medical decision making (see chart for details). If possible, patient and/or family made aware of any abnormal findings.  No results found. ____________________________________________    PROCEDURES  Procedure(s) performed: None  Procedures  Critical Care performed: None  ____________________________________________   INITIAL IMPRESSION / ASSESSMENT AND PLAN / ED COURSE  Pertinent labs &  imaging results that were available during my  care of the patient were reviewed by me and considered in my medical decision making (see chart for details). Patient here with vomiting-like behavior and abdominal discomfort without a significant abdominal tenderness morbid obesity limits exam however.  Does have history of poorly controlled diabetes heart disease and multiple other metabolic related pathologies.  We will give her IV fluid with some degree of ginger and so she does not appear to be markedly dehydrated.  Blood pressure is up but she is remarkably anxious we will give her Haldol which I found works very well with the nausea associated with hyperemesis.  QTC is 487 and no other history of long QT.  We will obtain cardiac enzymes, VBG to look for DKA, we will obtain CMP and look at her gap and liver function test I will obtain lipase, and given morbid obesity we will obtain CT scan to ensure no other pathology is present.  ----------------------------------------- 8:59 PM on 04/16/2019 -----------------------------------------  Blood work thus far is reassuring, patient is hyperventilating and appears but certainly not acidemic.  She continues to have loud yelling vomiting noises which are productive of minimal amounts of vomit.  She would like pain medication.  I will give her fentanyl as there is always a chance she is withdrawing from something as well.  CBC is reassuring CMP and the other labs are pending she has not given [place] a urine sample.  I did review her DEA record as well.  ----------------------------------------- 9:35 PM on 04/16/2019 -----------------------------------------  Patient is calmer breathing down but she is no longer making the vomiting noises and she appears to be gradually calming down.  She still breathing a little bit quickly and seems anxious but it is I think improving greatly since the medication.  Her pH betrays that she has been hyperventilating her PCO2 is  down and I think likely there is a cycle of anxiety and vomiting that self reinforces with hyperventilation.  I do not think this is a primary pulmonary pathology and have low suspicion that she is actually in DKA given the fact that her pH is elevated.  Her blood work is reassuring thus far and she remains with a nonsurgical abdomen.  I have coached her about her breathing and she is calling down.  ----------------------------------------- 10:29 PM on 04/16/2019 ----------------------------------------- Patient feels much better tolerating p.o., urine does appear to be questionable but most likely is contaminant, nitrite negative many squamous epithelial cells, no evidence of UTI clinically, I did offer her a urine catheterization which is the only way we will be able to figure out if she has a urinary tract infection and she refuses.  This obviously limits her work-up a little bit but clearly is her choice and she understands the risk benefits and alternatives to refusing the procedure.  She is asking for something for residual sour stomach but she is not vomiting she is tolerating p.o.  We talked about admission versus discharge.  She strongly feels that she would like to go home because she is concerned about picking up the coronavirus in the hospital which I do not think is unreasonable but she understands this does limit our ability to continue to watch her.  I do notice that there is cannabinoid in her urine.  This kind of vomiting that she presents with is very classically similar to the hyperemesis that we see with the modern high THC cannabinoids that people are using.  I did counsel her about this.  She is tolerating p.o.  comprehensive work-up here is reassuring and she declines admission, no evidence of DKA, I think we will be hopefully able to get her home soon.    ____________________________________________   FINAL CLINICAL IMPRESSION(S) / ED DIAGNOSES  Final diagnoses:  None       This chart was dictated using voice recognition software.  Despite best efforts to proofread,  errors can occur which can change meaning.      Schuyler Amor, MD 04/16/19 2039    Schuyler Amor, MD 04/16/19 2100    Schuyler Amor, MD 04/16/19 2136    Schuyler Amor, MD 04/16/19 2231

## 2019-04-16 NOTE — Discharge Instructions (Signed)
It was a pleasure to meet you, we are very relieved that you are feeling better.  We talked about admission to the hospital but you would prefer to go home which is not unreasonable but does limit our ability to continue to monitor you.  If you feel worse in any way including vomiting fever chest pain shortness of breath abdominal discomfort or other concerns return to the emergency room.  We do notice that you are positive for marijuana.  Sometimes that can cause a cyclic vomiting syndrome, we cannot say that is the cause of your vomiting but the kind of vomiting that you were having certainly is consistent with a vomiting we see with people using marijuana sometimes these days.  The THC contents of marijuana is so high, that I can actually bring up the side effects.  We advised that you avoid marijuana.  We talked about the possibility that you have a urinary tract infection and the need to do a cath to know for sure, however, at this time we have low suspicion.  You do not want [place] to do a catheterization which is certainly your choice but if you have fever, or pain in your back return to the emergency room.  We will send her home with a few days of antibiotics to cover you while the culture is cooking.

## 2019-04-16 NOTE — Chronic Care Management (AMB) (Signed)
  Chronic Care Management   Note  04/16/2019 Name: Monick Rena MRN: [id number] DOB: 1981/02/01  Subjective: Patient is a 38 year old female followed by Marnee Guarneri, NP for primary care, referred to chronic care management for support in managing diabetes, heart failure, CKD, CAD, gastroparesis, neuropathy, depression.    Follow up plan: - If I do not hear back, will follow up in 1-2 weeks  Catie Darnelle Maffucci, PharmD Clinical Pharmacist [place] [phone]

## 2019-04-16 NOTE — ED Triage Notes (Signed)
Pt to ED via EMS from home with c/o upper abd pain, pt states shes had this pain xfew days and has had n/v. Pt arrives throwing up dark brown liquid, groaning. Pt has hx of HTN, arrives with 164/118 bp.

## 2019-04-17 ENCOUNTER — Other Ambulatory Visit: Payer: Self-pay

## 2019-04-17 ENCOUNTER — Encounter: Payer: Self-pay | Admitting: Emergency Medicine

## 2019-04-17 ENCOUNTER — Emergency Department
Admission: EM | Admit: 2019-04-17 | Discharge: 2019-04-17 | Disposition: A | Discharge status: Home / Self Care | Payer: Medicare Other | Attending: Emergency Medicine | Admitting: Emergency Medicine

## 2019-04-17 DIAGNOSIS — E119 Type 2 diabetes mellitus without complications: Secondary | ICD-10-CM | POA: Insufficient documentation

## 2019-04-17 DIAGNOSIS — I129 Hypertensive chronic kidney disease with stage 1 through stage 4 chronic kidney disease, or unspecified chronic kidney disease: Secondary | ICD-10-CM | POA: Insufficient documentation

## 2019-04-17 DIAGNOSIS — I2510 Atherosclerotic heart disease of native coronary artery without angina pectoris: Secondary | ICD-10-CM | POA: Diagnosis not present

## 2019-04-17 DIAGNOSIS — I10 Essential (primary) hypertension: Secondary | ICD-10-CM | POA: Diagnosis not present

## 2019-04-17 DIAGNOSIS — Z7982 Long term (current) use of aspirin: Secondary | ICD-10-CM | POA: Insufficient documentation

## 2019-04-17 DIAGNOSIS — R1110 Vomiting, unspecified: Secondary | ICD-10-CM | POA: Diagnosis not present

## 2019-04-17 DIAGNOSIS — E785 Hyperlipidemia, unspecified: Secondary | ICD-10-CM | POA: Insufficient documentation

## 2019-04-17 DIAGNOSIS — N183 Chronic kidney disease, stage 3 (moderate): Secondary | ICD-10-CM | POA: Diagnosis not present

## 2019-04-17 DIAGNOSIS — R109 Unspecified abdominal pain: Secondary | ICD-10-CM | POA: Diagnosis not present

## 2019-04-17 DIAGNOSIS — Z20828 Contact with and (suspected) exposure to other viral communicable diseases: Secondary | ICD-10-CM | POA: Diagnosis not present

## 2019-04-17 DIAGNOSIS — R1111 Vomiting without nausea: Secondary | ICD-10-CM

## 2019-04-17 LAB — CBC
HCT: 37.3 % (ref 36.0–46.0)
Hemoglobin: 12 g/dL (ref 12.0–15.0)
MCH: 26 pg (ref 26.0–34.0)
MCHC: 32.2 g/dL (ref 30.0–36.0)
MCV: 80.7 fL (ref 80.0–100.0)
Platelets: 447 10*3/uL — ABNORMAL HIGH (ref 150–400)
RBC: 4.62 MIL/uL (ref 3.87–5.11)
RDW: 17.5 % — ABNORMAL HIGH (ref 11.5–15.5)
WBC: 12.4 10*3/uL — ABNORMAL HIGH (ref 4.0–10.5)
nRBC: 0 % (ref 0.0–0.2)

## 2019-04-17 LAB — COMPREHENSIVE METABOLIC PANEL
ALT: 35 U/L (ref 0–44)
AST: 28 U/L (ref 15–41)
Albumin: 3.2 g/dL — ABNORMAL LOW (ref 3.5–5.0)
Alkaline Phosphatase: 346 U/L — ABNORMAL HIGH (ref 38–126)
Anion gap: 11 (ref 5–15)
BUN: 24 mg/dL — ABNORMAL HIGH (ref 6–20)
CO2: 23 mmol/L (ref 22–32)
Calcium: 9.1 mg/dL (ref 8.9–10.3)
Chloride: 103 mmol/L (ref 98–111)
Creatinine, Ser: 1.39 mg/dL — ABNORMAL HIGH (ref 0.44–1.00)
GFR calc Af Amer: 55 mL/min — ABNORMAL LOW (ref >60)
GFR calc non Af Amer: 48 mL/min — ABNORMAL LOW (ref >60)
Glucose, Bld: 330 mg/dL — ABNORMAL HIGH (ref 70–99)
Potassium: 4.1 mmol/L (ref 3.5–5.1)
Sodium: 137 mmol/L (ref 135–145)
Total Bilirubin: 0.5 mg/dL (ref 0.3–1.2)
Total Protein: 7.9 g/dL (ref 6.5–8.1)

## 2019-04-17 LAB — LIPASE, BLOOD: Lipase: 22 U/L (ref 11–51)

## 2019-04-17 LAB — TROPONIN I: Troponin I: <0.03 ng/mL (ref <0.03)

## 2019-04-17 MED ORDER — HALOPERIDOL LACTATE 5 MG/ML IJ SOLN
5.0000 mg | Freq: Once | INTRAMUSCULAR | Status: AC
  Administered 2019-04-17: 13:00:00 5 mg via INTRAVENOUS
  Filled 2019-04-17: qty 1

## 2019-04-17 MED ORDER — SODIUM CHLORIDE 0.9% FLUSH: 3.0000 mL | Freq: Once | INTRAVENOUS | Status: DC

## 2019-04-17 MED ORDER — METOCLOPRAMIDE HCL 10 MG PO TABS: 10.0000 mg | ORAL_TABLET | Freq: Three times a day (TID) | ORAL | 0 refills | Status: DC

## 2019-04-17 MED ORDER — ONDANSETRON 4 MG PO TBDP
4.0000 mg | ORAL_TABLET | Freq: Once | ORAL | Status: AC
  Administered 2019-04-17: 11:00:00 4 mg via ORAL
  Filled 2019-04-17: qty 1

## 2019-04-17 MED ORDER — FAMOTIDINE IN NACL 20-0.9 MG/50ML-% IV SOLN
20.0000 mg | Freq: Once | INTRAVENOUS | Status: AC
  Administered 2019-04-17: 13:00:00 20 mg via INTRAVENOUS
  Filled 2019-04-17: qty 50

## 2019-04-17 MED ORDER — MORPHINE SULFATE (PF) 4 MG/ML IV SOLN
4.0000 mg | Freq: Once | INTRAVENOUS | Status: AC
  Administered 2019-04-17: 4 mg via INTRAVENOUS
  Filled 2019-04-17: qty 1

## 2019-04-17 MED ORDER — METOCLOPRAMIDE HCL 5 MG/ML IJ SOLN
10.0000 mg | Freq: Once | INTRAMUSCULAR | Status: AC
  Administered 2019-04-17: 13:00:00 10 mg via INTRAVENOUS
  Filled 2019-04-17: qty 2

## 2019-04-17 MED ORDER — MORPHINE SULFATE (PF) 4 MG/ML IV SOLN
4.0000 mg | Freq: Once | INTRAVENOUS | Status: AC
  Administered 2019-04-17: 15:00:00 4 mg via INTRAVENOUS
  Filled 2019-04-17: qty 1

## 2019-04-17 NOTE — ED Notes (Signed)
Pt verbalized understanding of discharge instructions.Pt to be picked up by family/friend. NAD at this time.

## 2019-04-17 NOTE — ED Provider Notes (Signed)
[facility] Emergency Department Provider Note ____________________________________________  Time seen: 1220  I have reviewed the triage vital signs and the nursing notes.  HISTORY  Chief Complaint  Abdominal Pain  HPI [name] [name] is a 38 y.o. female returns to the ED after evaluation yesterday for similar complaints.  Patient with a history of chronic abdominal pain, and chronic nausea and vomiting, presents for continued symptoms.  Patient has received CKD, diabetes, diverticulitis, CHF, and CAD.  She had a work-up yesterday that was negative for any underlying acute cause of her ongoing nausea and vomiting.  CT scan was negative and labs are reassuring.  Patient was discharged after IV medications, with cephalexin and Zofran.  She reports he has not taken her medications at this time.  She describes recurrence of her symptoms at about 3:00 this morning.  Since that time she is had intermittent nausea with vomiting, and pain that is generalized to the abdomen.  She denies any hematemesis, chest pain, shortness of breath, diarrhea, or constipation.  Past Medical History:  Diagnosis Date  . CAD (coronary artery disease)    a. 03/2015 NSTEMI/PCI: LCX 146m (DES), OM1 100p (DES - vessel labeled Ramus in subsequent cath report). Minor irregs to LAD/RCA; b. 04/2015 Cath: LM nl, LAD 40p, RI patent stent, LCX patent stent, RCA nl, EF 55-65%; c. 02/2016 MV: basal inferolateral and mid inferolateral defect/infarct w/o ischemia. EF 55-65%; d. 11/2018 PCI: LM 30ost, LAD 30p, LCX 95p/m (PTCA->20%), OM1 10ost, OM2 40ost, RCA 85p.  Marland Kitchen Chronic diastolic (congestive) heart failure ([place])    a. 03/2015 Echo: EF 30-35%, mild concentric LVH, severe HK of inf, inflat, & lat walls, mod MR, mild TR;  b. 04/2015 LV Gram: EF 55-65%; c. 09/2017 Echo: EF 55-60%, no rwma. Mild MR; d. 11/2018 Echo: Ef 60-65%, no rwma.  . CKD (chronic kidney disease), stage III ([place])   . Diabetes type 2, controlled ([place])     a. since 2002   . Diabetic gastroparesis (HCC)    a. chronic nausea/vomiting.  . Diverticulosis, sigmoid 07/2016  . Heavy menses    a. H/O IUD - expired in 2014 - remains in place.  . Hyperlipidemia   . Hypertension   . Iron deficiency anemia   . Ischemic cardiomyopathy (resolved)    a. 03/2015 EF 30-35% post NSTEMI;  b. 04/2015 EF 55-65% on LV gram; c. 09/2017 Echo: EF 55-60%; d. 11/2018 Echo: Ef 60-65%.  . Obesity   . Tobacco abuse    a. quit 03/2015.  Marland Kitchen Uterine fibroid   . Vertigo   . Vitamin D deficiency     Patient Active Problem List   Diagnosis Date Noted  . Anemia 01/14/2019  . Stable angina ([place]) 01/12/2019  . Thrombocytosis ([place]) 01/05/2019  . Coronary artery disease of native artery of native heart with stable angina pectoris ([place]) 12/17/2018  . Unstable angina ([place]) 12/04/2018  . Diabetic gastroparesis associated with type 2 diabetes mellitus ([place]) 05/02/2018  . CKD stage 3 due to type 2 diabetes mellitus ([place]) 04/06/2018  . Poorly controlled type 2 diabetes mellitus ([place]) 08/27/2017  . Lung nodule < 6cm on CT 08/12/2017  . Depression, major, single episode, severe ([place]) 08/12/2017  . Chronic abdominal pain 08/03/2017  . Obstructive sleep apnea 05/16/2017  . Neuropathy 01/17/2017  . Vitamin D deficiency 10/25/2016  . Insomnia 10/25/2016  . 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 Elevated liver function tests 06/05/2016  .  Chronic diastolic heart failure ([place]) 03/19/2016  . Proteinuria 08/30/2015  . CAD in native artery   . Angina pectoris ([place])   . Iron deficiency anemia   . Heavy menses   . Hyperlipidemia associated with type 2 diabetes mellitus ([place]) 04/13/2015  . Ischemic cardiomyopathy   . Obesity     Past Surgical History:  Procedure Laterality Date  . APPENDECTOMY    . CARDIAC CATHETERIZATION  4/16   x2 stent [facility]  . CARDIAC CATHETERIZATION N/A 05/05/2015   Procedure: Left Heart Cath and Coronary  Angiography;  Surgeon: Peter M Martinique, MD;  Location: [facility];  Service: Cardiovascular;  Laterality: N/A;  . COLONOSCOPY WITH PROPOFOL N/A 07/31/2016   Procedure: COLONOSCOPY WITH PROPOFOL;  Surgeon: Lollie Sails, MD;  Location: [facility] ENDOSCOPY;  Service: Endoscopy;  Laterality: N/A;  . CORONARY BALLOON ANGIOPLASTY N/A 12/04/2018   Procedure: CORONARY BALLOON ANGIOPLASTY;  Surgeon: Wellington Hampshire, MD;  Location: [place] [facility];  Service: Cardiovascular;  Laterality: N/A;  . CORONARY STENT INTERVENTION N/A 01/12/2019   Procedure: CORONARY STENT INTERVENTION;  Surgeon: [name], MD;  Location: [facility];  Service: Cardiovascular;  Laterality: N/A;  . ESOPHAGOGASTRODUODENOSCOPY (EGD) WITH PROPOFOL N/A 07/31/2016   Procedure: ESOPHAGOGASTRODUODENOSCOPY (EGD) WITH PROPOFOL;  Surgeon: Lollie Sails, MD;  Location: [facility] ENDOSCOPY;  Service: Endoscopy;  Laterality: N/A;  . INTRAVASCULAR PRESSURE WIRE/FFR STUDY N/A 12/04/2018   Procedure: INTRAVASCULAR PRESSURE WIRE/FFR STUDY;  Surgeon: Wellington Hampshire, MD;  Location: [facility];  Service: Cardiovascular;  Laterality: N/A;  . LAPAROSCOPIC APPENDECTOMY N/A 08/07/2016   Procedure: APPENDECTOMY LAPAROSCOPIC;  Surgeon: Hubbard Robinson, MD;  Location: [facility] ORS;  Service: General;  Laterality: N/A;  . LEFT HEART CATH AND CORONARY ANGIOGRAPHY Left 12/04/2018   Procedure: LEFT HEART CATH AND CORONARY ANGIOGRAPHY;  Surgeon: Wellington Hampshire, MD;  Location: [facility];  Service: Cardiovascular;  Laterality: Left;  . LEFT HEART CATH AND CORONARY ANGIOGRAPHY N/A 01/12/2019   Procedure: LEFT HEART CATH AND CORONARY ANGIOGRAPHY;  Surgeon: [name], MD;  Location: [facility];  Service: Cardiovascular;  Laterality: N/A;  . MOUTH SURGERY      Prior to Admission medications   Medication Sig Start Date End Date Taking? Authorizing Provider  aspirin EC 81 MG tablet Take 81 mg by mouth  daily.    [provider]  benzonatate (TESSALON) 100 MG capsule Take 1 capsule (100 mg total) by mouth 2 (two) times daily as needed for cough. 01/15/19   Cannady, Henrine Screws T, NP  cephALEXin (KEFLEX) 500 MG capsule Take 1 capsule (500 mg total) by mouth 3 (three) times daily for 5 days. 04/16/19 04/21/19  Schuyler Amor, MD  Cholecalciferol 2000 units TABS Take 2,000 Units by mouth daily.     [provider]  clopidogrel (PLAVIX) 75 MG tablet Take 4 tabs at 7pm tonight (12/28), then 1 tab daily beginning 12/29. Patient taking differently: Take 75 mg by mouth daily.  12/05/18   Theora Gianotti, NP  DULoxetine (CYMBALTA) 30 MG capsule Take 60 mg by mouth daily.  07/01/18   [provider]  etonogestrel (NEXPLANON) 68 MG IMPL implant 1 each by Subdermal route once.    [provider]  FARXIGA 10 MG TABS tablet Take 10 mg by mouth daily.  09/08/18   [provider]  ferrous sulfate 325 (65 FE) MG tablet Take 1 tablet (325 mg total) by mouth daily. 05/04/18   Violet,  Richard, MD  furosemide (LASIX) 40 MG tablet Take 1 tablet (40 mg total) by mouth every other day. 01/19/19 07/18/19  End, Harrell Gave, MD  glimepiride (AMARYL) 4 MG tablet TAKE 1 TABLET BY MOUTH ONCE DAILY Patient taking differently: Take 4 mg by mouth daily.  10/21/18   Marnee Guarneri T, NP  glucose blood (ACCU-CHEK ACTIVE STRIPS) test strip Use as instructed 02/11/18   Volney American, PA-C  glucose blood (ACCU-CHEK AVIVA) test strip 1 each by Other route as needed for other. Use as instructed    [provider]  insulin aspart (NOVOLOG) 100 UNIT/ML injection Inject 10 Units into the skin 3 (three) times daily with meals. Patient taking differently: Inject 6-20 Units into the skin 3 (three) times daily with meals.  05/04/18   Loletha Grayer, MD  insulin glargine (LANTUS) 100 UNIT/ML injection Inject 0.4 mLs (40 Units total) into the skin at bedtime. Patient taking differently:  Inject 60 Units into the skin at bedtime.  05/04/18   Wieting, Richard, MD  Insulin Pen Needle 32G X 4 MM MISC 1 Units by Does not apply route every morning. Pen needles 12/24/18   Cannady, Henrine Screws T, NP  irbesartan (AVAPRO) 150 MG tablet Take 150 mg by mouth daily.    [provider]  isosorbide mononitrate (IMDUR) 30 MG 24 hr tablet Take 2 tablets (60 mg total) by mouth daily. 12/17/18   Rise Mu, PA-C  loperamide (IMODIUM A-D) 2 MG tablet Take 8-12 mg by mouth daily as needed for diarrhea or loose stools.    [provider]  meclizine (ANTIVERT) 25 MG tablet Take 25 mg by mouth 3 (three) times daily as needed for dizziness.    [provider]  metoCLOPramide (REGLAN) 10 MG tablet Take 1 tablet (10 mg total) by mouth 3 (three) times daily with meals for 10 days. 04/17/19 04/27/19  Jamien Casanova, Dannielle Karvonen, PA-C  metoprolol tartrate (LOPRESSOR) 50 MG tablet Take 1 tablet (50 mg total) by mouth 2 (two) times daily. 06/09/18   Theora Gianotti, NP  mirtazapine (REMERON) 30 MG tablet Take 30 mg by mouth daily.  08/12/18   [provider]  nitroGLYCERIN (NITROSTAT) 0.4 MG SL tablet Take 1 tab under your tongue while sitting for chest pain, If no relief may repeat, one tab every 5 min up to 3 tabs total over 15 mins. 06/09/18   Theora Gianotti, NP  ondansetron (ZOFRAN) 4 MG tablet Take 1 tablet (4 mg total) by mouth every 8 (eight) hours as needed for nausea or vomiting. 04/16/19   Schuyler Amor, MD  pantoprazole (PROTONIX) 40 MG tablet TAKE 1 TABLET BY MOUTH ONCE DAILY AT 6AM Patient taking differently: Take 40 mg by mouth 2 (two) times daily.  10/21/18   Venita Lick, NP    Allergies Lisinopril; Rosemary oil; Shellfish allergy; Other; and Metformin and related  Family History  Problem Relation Age of Onset  . Diabetes Father   . Cancer Maternal Grandmother        lung  . Cancer Maternal Grandfather        prostate  . Diabetes Paternal  Grandfather   . Diabetes Paternal Grandmother   . Cancer - Cervical Maternal Aunt     Social History Social History   Tobacco Use  . Smoking status: Former Smoker    Years: 15.00    Last attempt to quit: 03/10/2015    Years since quitting: 4.1  . Smokeless tobacco: Never Used  Substance Use Topics  . Alcohol use: No    Alcohol/week: 0.0 standard drinks  . Drug use: No    Review of Systems  Constitutional: Negative for fever. Eyes: Negative for visual changes. ENT: Negative for sore throat. Cardiovascular: Negative for chest pain. Respiratory: Negative for shortness of breath. Gastrointestinal: Positive for abdominal pain, vomiting. Denies diarrhea. Genitourinary: Negative for dysuria. Musculoskeletal: Negative for back pain. Skin: Negative for rash. Neurological: Negative for headaches, focal weakness or numbness. ____________________________________________  PHYSICAL EXAM:  VITAL SIGNS: ED Triage Vitals  Enc Vitals Group     BP 04/17/19 1102 (!) 216/109     Pulse Rate 04/17/19 1101 (!) 110     Resp 04/17/19 1101 (!) 24     Temp 04/17/19 1101 98.5 F (36.9 C)     Temp Source 04/17/19 1101 Oral     SpO2 04/17/19 1101 100 %     Weight 04/17/19 1057 279 lb 15.8 oz (127 kg)     Height 04/17/19 1057 5\' 5"  (1.651 m)     Head Circumference --      Peak Flow --      Pain Score 04/17/19 1057 10     Pain Loc --      Pain Edu? --      Excl. in [place]? --     Constitutional: Alert and oriented. Well appearing and in no distress. Head: Normocephalic and atraumatic. Eyes: Conjunctivae are normal. Normal extraocular movements Cardiovascular: Normal rate, regular rhythm. Normal distal pulses. Respiratory: Normal respiratory effort. No wheezes/rales/rhonchi. Gastrointestinal: Obese, soft and nontender. No distention, rebound, guarding, or rigidity. Reports generalized tender over the abdomen.  Musculoskeletal: Nontender with normal range of motion in all extremities.   Neurologic:  Normal gait without ataxia. Normal speech and language. No gross focal neurologic deficits are appreciated. Skin:  Skin is warm, dry and intact. No rash noted. Psychiatric: Mood and affect are normal. Patient exhibits appropriate insight and judgment. ____________________________________________   LABS (pertinent positives/negatives)  Labs Reviewed  COMPREHENSIVE METABOLIC PANEL - Abnormal; Notable for the following components:      Result Value   Glucose, Bld 330 (*)    BUN 24 (*)    Creatinine, Ser 1.39 (*)    Albumin 3.2 (*)    Alkaline Phosphatase 346 (*)    GFR calc non Af Amer 48 (*)    GFR calc Af Amer 55 (*)    All other components within normal limits  CBC - Abnormal; Notable for the following components:   WBC 12.4 (*)    RDW 17.5 (*)    Platelets 447 (*)    All other components within normal limits  LIPASE, BLOOD  TROPONIN I  URINALYSIS, COMPLETE (UACMP) WITH MICROSCOPIC  POC URINE PREG, ED  ____________________________________________  EKG  Sinus Tachycardia 111 bpm PR interval 142 ms QRS duration 78 ms ____________________________________________  PROCEDURES  Procedures Haloperidol 5 mg IVP Metoclopramide 10 mg I VP Ondansetron 4 mg ODT Famotidine 20 mg IVPB Morphine 4 mg IVP x 2 ____________________________________________  INITIAL IMPRESSION / ASSESSMENT AND PLAN / ED COURSE  Yeimy Brabant was evaluated in Emergency Department on 04/17/2019 for the symptoms described in the history of present illness. She was evaluated in the context of the global COVID-19 pandemic, which necessitated consideration that the patient might be at risk for infection with the SARS-CoV-2 virus that causes COVID-19. Institutional protocols and algorithms that pertain to the evaluation of patients at risk for COVID-19 are in a state of rapid  change based on information released by regulatory bodies including the CDC and federal and state organizations. These  policies and algorithms were followed during the patient's care in the ED.  Differential diagnosis includes, but is not limited to, biliary disease (biliary colic, acute cholecystitis, cholangitis, choledocholithiasis, etc), intrathoracic causes for epigastric abdominal pain including ACS, gastritis, duodenitis, pancreatitis, small bowel or large bowel obstruction, abdominal aortic aneurysm, hernia, and ulcer(s).  ----------------------------------------- 5:08 PM on 04/17/2019 -----------------------------------------  Patient with ED evaluation of persistent nausea and vomiting with a history of chronic abdominal pain and chronic nausea vomiting syndrome.  Patient's exam is overall benign and her course in the ED has been stable overall.  She was evaluated yesterday with complete work-up including CT scan which did not show any underlying etiology for her symptoms.  Patient has had IV medications including morphine, Reglan, haloperidol, famotidine, and Zofran.  She reports at this time, improvement of her symptoms and denies any current pain, nausea, or vomiting patient be discharged to follow with primary provider or gastroenterologist as needed.  A prescription for Reglan is provided for her nausea vomiting relief. ____________________________________________  FINAL CLINICAL IMPRESSION(S) / ED DIAGNOSES  Final diagnoses:  Non-intractable vomiting without nausea, unspecified vomiting type      Marites Nath, Dannielle Karvonen, PA-C 04/17/19 1709    Delman Kitten, MD 04/18/19 1600

## 2019-04-17 NOTE — ED Triage Notes (Signed)
Pt to ED via POV c/o abdominal pain, N/V. Pt seen yesterday for same. Pt states that nothing has changed since yesterday. Pt appears to be hyperventilating in triage.

## 2019-04-17 NOTE — Discharge Instructions (Signed)
Your exam and labs are normal and reassuring at this time. You will be discharged with a prescription for Reglan, to control your nausea & vomiting. Start the antibiotic prescribed yesterday. Take your other home meds as directed. Follow-up with your primary provider. Return to the ED only if needed.

## 2019-04-17 NOTE — ED Provider Notes (Signed)
EKG is reviewed entered by 1115 Heart rate 110 QRS 80 QTc 460 Sinus tachycardia, no evidence of acute ischemia   Delman Kitten, MD 04/17/19 1401

## 2019-04-17 NOTE — ED Notes (Signed)
Spoke with Dr. Jacqualine Code, per Dr. Jacqualine Code, give pt ODT zofran and repeat abdominal work up

## 2019-04-17 NOTE — ED Provider Notes (Signed)
Medical screening examination/treatment/procedure(s) were conducted as a shared visit with non-physician practitioner(s) and myself.  I personally evaluated the patient during the encounter.   Despite 3 antiemetics and dose of morphine patient continues to report periumbilical abdominal pain with nausea.  Reports inability to keep fluids down.  Will trial p.o. challenge, reviewed yesterday's work-up including CT scan.  I certainly think higher differential would be cyclical vomiting she reports the symptoms have happened multiple times in the past.  She does have a cardiac disease but denies chest pain.  Ordered a single troponin, seems very low risk for ACS at this point.  Additionally ordered additional morphine as she does appear to be in pain and discomfort which I suspect is driving her slight tachycardia and hypertension at this point.  Ongoing care assigned to Dr. Archie Balboa, also nurse practitioner Berniece Salines continuing to follow.   Delman Kitten, MD 04/17/19 1537

## 2019-04-17 NOTE — ED Notes (Signed)
Pt fell asleep after medication administered.

## 2019-04-18 DIAGNOSIS — R1033 Periumbilical pain: Secondary | ICD-10-CM | POA: Diagnosis not present

## 2019-04-18 DIAGNOSIS — R1110 Vomiting, unspecified: Secondary | ICD-10-CM | POA: Diagnosis not present

## 2019-04-18 LAB — URINE CULTURE

## 2019-04-21 ENCOUNTER — Ambulatory Visit (INDEPENDENT_AMBULATORY_CARE_PROVIDER_SITE_OTHER): Payer: Medicare Other | Admitting: Pharmacist

## 2019-04-21 ENCOUNTER — Ambulatory Visit (INDEPENDENT_AMBULATORY_CARE_PROVIDER_SITE_OTHER): Payer: Medicare Other | Admitting: Nurse Practitioner

## 2019-04-21 ENCOUNTER — Other Ambulatory Visit: Payer: Self-pay

## 2019-04-21 ENCOUNTER — Encounter: Payer: Self-pay | Admitting: Nurse Practitioner

## 2019-04-21 VITALS — BP 126/87 | HR 88 | Temp 98.5°F | Ht 65.0 in | Wt 268.0 lb

## 2019-04-21 DIAGNOSIS — I130 Hypertensive heart and chronic kidney disease with heart failure and stage 1 through stage 4 chronic kidney disease, or unspecified chronic kidney disease: Secondary | ICD-10-CM | POA: Diagnosis not present

## 2019-04-21 DIAGNOSIS — E1143 Type 2 diabetes mellitus with diabetic autonomic (poly)neuropathy: Secondary | ICD-10-CM | POA: Diagnosis not present

## 2019-04-21 DIAGNOSIS — K3184 Gastroparesis: Secondary | ICD-10-CM | POA: Diagnosis not present

## 2019-04-21 DIAGNOSIS — E785 Hyperlipidemia, unspecified: Secondary | ICD-10-CM | POA: Diagnosis not present

## 2019-04-21 DIAGNOSIS — N183 Chronic kidney disease, stage 3 unspecified: Secondary | ICD-10-CM

## 2019-04-21 DIAGNOSIS — I200 Unstable angina: Secondary | ICD-10-CM

## 2019-04-21 DIAGNOSIS — E1165 Type 2 diabetes mellitus with hyperglycemia: Secondary | ICD-10-CM | POA: Diagnosis not present

## 2019-04-21 DIAGNOSIS — E1169 Type 2 diabetes mellitus with other specified complication: Secondary | ICD-10-CM

## 2019-04-21 LAB — MICROALBUMIN, URINE WAIVED
Creatinine, Urine Waived: 200 mg/dL (ref 10–300)
Microalb, Ur Waived: 150 mg/L — ABNORMAL HIGH (ref 0–19)

## 2019-04-21 NOTE — Assessment & Plan Note (Signed)
Continue to collaborate with GI and endocrinology.  Continue Reglan at this time.  Obtain labs today.

## 2019-04-21 NOTE — Patient Instructions (Signed)
Irritable Bowel Syndrome, Adult    Irritable bowel syndrome (IBS) is a group of symptoms that affects the organs responsible for digestion (gastrointestinal or GI tract). IBS is not one specific disease.  To regulate how the GI tract works, the body sends signals back and forth between the intestines and the brain. If you have IBS, there may be a problem with these signals. As a result, the GI tract does not function normally. The intestines may become more sensitive and overreact to certain things. This may be especially true when you eat certain foods or when you are under stress.  There are four types of IBS. These may be determined based on the consistency of your stool (feces):   IBS with diarrhea.   IBS with constipation.   Mixed IBS.   Unsubtyped IBS.  It is important to know which type of IBS you have. Certain treatments are more likely to be helpful for certain types of IBS.  What are the causes?  The exact cause of IBS is not known.  What increases the risk?  You may have a higher risk for IBS if you:   Are female.   Are younger than 40.   Have a family history of IBS.   Have a mental health condition, such as depression, anxiety, or post-traumatic stress disorder.   Have had a bacterial infection of your GI tract.  What are the signs or symptoms?  Symptoms of IBS vary from person to person. The main symptom is abdominal pain or discomfort. Other symptoms usually include one or more of the following:   Diarrhea, constipation, or both.   Abdominal swelling or bloating.   Feeling full after eating a small or regular-sized meal.   Frequent gas.   Mucus in the stool.   A feeling of having more stool left after a bowel movement.  Symptoms tend to come and go. They may be triggered by stress, mental health conditions, or certain foods.  How is this diagnosed?  This condition may be diagnosed based on a physical exam, your medical history, and your symptoms. You may have tests, such as:   Blood  tests.   Stool test.   X-rays.   CT scan.   Colonoscopy. This is a procedure in which your GI tract is viewed with a long, thin, flexible tube.  How is this treated?  There is no cure for IBS, but treatment can help relieve symptoms. Treatment depends on the type of IBS you have, and may include:   Changes to your diet, such as:  ? Avoiding foods that cause symptoms.  ? Drinking more water.  ? Following a low-FODMAP (fermentable oligosaccharides, disaccharides, monosaccharides, and polyols) diet for up to 6 weeks, or as told by your health care provider. FODMAPs are sugars that are hard for some people to digest.  ? Eating more fiber.  ? Eating medium-sized meals at the same times every day.   Medicines. These may include:  ? Fiber supplements, if you have constipation.  ? Medicine to control diarrhea (antidiarrheal medicines).  ? Medicine to help control muscle tightening (spasms) in your GI tract (antispasmodic medicines).  ? Medicines to help with mental health conditions, such as antidepressants or tranquilizers.   Talk therapy or counseling.   Working with a diet and nutrition specialist (dietitian) to help create a food plan that is right for you.   Managing your stress.  Follow these instructions at home:  Eating and drinking   Eat   a healthy diet.   Eat medium-sized meals at about the same time every day. Do not eat large meals.   Gradually eat more fiber-rich foods. These include whole grains, fruits, and vegetables. This may be especially helpful if you have IBS with constipation.   Eat a diet low in FODMAPs.   Drink enough fluid to keep your urine pale yellow.   Keep a journal of foods that seem to trigger symptoms.   Avoid foods and drinks that:  ? Contain added sugar.  ? Make your symptoms worse. Dairy products, caffeinated drinks, and carbonated drinks can make symptoms worse for some people.  General instructions   Take over-the-counter and prescription medicines and supplements only  as told by your health care provider.   Get enough exercise. Do at least 150 minutes of moderate-intensity exercise each week.   Manage your stress. Getting enough sleep and exercise can help you manage stress.   Keep all follow-up visits as told by your health care provider and therapist. This is important.  Alcohol Use   Do not drink alcohol if:  ? Your health care provider tells you not to drink.  ? You are pregnant, may be pregnant, or are planning to become pregnant.   If you drink alcohol, limit how much you have:  ? 0-1 drink a day for women.  ? 0-2 drinks a day for men.   Be aware of how much alcohol is in your drink. In the [place] one drink equals one typical bottle of beer (12 oz), one-half glass of wine (5 oz), or one shot of hard liquor (1 oz).  Contact a health care provider if you have:   Constant pain.   Weight loss.   Difficulty or pain when swallowing.   Diarrhea that gets worse.  Get help right away if you have:   Severe abdominal pain.   Fever.   Diarrhea with symptoms of dehydration, such as dizziness or dry mouth.   Bright red blood in your stool.   Stool that is black and tarry.   Abdominal swelling.   Vomiting that does not stop.   Blood in your vomit.  Summary   Irritable bowel syndrome (IBS) is not one specific disease. It is a group of symptoms that affects digestion.   Your intestines may become more sensitive and overreact to certain things. This may be especially true when you eat certain foods or when you are under stress.   There is no cure for IBS, but treatment can help relieve symptoms.  This information is not intended to replace advice given to you by your health care provider. Make sure you discuss any questions you have with your health care provider.  Document Released: 11/25/2005 Document Revised: 11/18/2017 Document Reviewed: 11/18/2017  Elsevier Interactive Patient Education  2019 [employer] Inc[employer]

## 2019-04-21 NOTE — Assessment & Plan Note (Signed)
Chronic, ongoing.  Followed by Dr. Gabriel Carina and missed recent labs/appointment.  Will obtain labs today and forward to Dr. Gabriel Carina.  Most recent A1C in January 9.8%.  Continue to collaborate with endocrinology and CCM team.  Return in 3 months.

## 2019-04-21 NOTE — Assessment & Plan Note (Addendum)
Chronic, ongoing.  BP at goal. Continue to collaborate with cardiology.  Continue current medication regimen.  CMP today.  Urine micro with ALB 150 and A:C 30-300.  On Irbesartan for kidney protection.

## 2019-04-21 NOTE — Patient Instructions (Signed)
Visit Information  Goals Addressed            This Visit's Progress     Patient Stated   . "My [name] pens don't work" (pt-stated)       Current Barriers:  Marland Kitchen Medication non-adherence due to pen malfunction. Patient reports that [place] pens would sometimes "get stuck" so she was unable to utilize the full quantity of the pen.  . She notes that endocrinology switched from [place] to Lantus a few months ago due to formulary preference. Can see in [place] that they had received some sort of notification that [name] was preferred over Lantus  Pharmacist Clinical Goal(s):  Marland Kitchen Over the next 14 days, patient will work with PCP and CCm team to address needs related to medication access  Interventions: . Patient noted that she has Medicare and separate Medicaid. Reviewed Medicaid formulary, appears Lantus is preferred. However, contacted [place] - appears patient has a Medicare Part D plan on file. Per Epic review, can see that she has a Musician. Will review this with patient moving forward. . [place] pharmacist noted that she remembered speaking to Ms. Swendsen about it. If pens get stuck, the patient can call the manufacturer to report it, and the manufacturer will give coupon card information to the pharmacy to give the patient a replacement supply.  Marland Kitchen However, pharmacist had a prescription on file for Lantus that also is covered by the patient's Part D plan. She will fill the Lantus for the patient, since the patient prefers this medication . Contacted patient; informed her of the above. She voiced understanding.   Patient Self Care Activities:  . Currently UNABLE TO independently communicate needs with providers  Initial goal documentation        The patient verbalized understanding of instructions provided today and declined a print copy of patient instruction materials.   Plan:  - Will have a telephonic appointment with  patient as previously scheduled on 04/28/2019 at 3 pm. Patient aware to have medications and blood sugars available for review.   Catie Darnelle Maffucci, PharmD Clinical Pharmacist [place] [phone]

## 2019-04-21 NOTE — Assessment & Plan Note (Signed)
Chronic, ongoing.  Poor statin tolerance with myalgia.  Followed by cardiology with discussion of PCSK-9.  Will collaborate with CCM pharmacist to determine if assistance available for these.  Lipid panel today.

## 2019-04-21 NOTE — Progress Notes (Signed)
BP 126/87   Pulse 88   Temp 98.5 F (36.9 C) (Oral)   Ht '5\' 5"'$  (1.651 m)   Wt 268 lb (121.6 kg)   SpO2 100%   BMI 44.60 kg/m    Subjective:    Patient ID: [name] [name], female    DOB: 1981-03-28, 38 y.o.   MRN: [id number]  HPI: [name] [name] is a 38 y.o. female  Chief Complaint  Patient presents with  . Hospitalization Follow-up   ER FOLLOW UP Presented to ER 04/16/2019 and 04/17/2019 for abdominal pain and vomiting.  She has history of chronic abdominal pain and N&V due to gastroparesis with T2DM.  CT scan was negative for acute findings on 04/16/2019 and she was discharged with Keflex and Zofran.  She then did not take medication and returned to ER 04/17/2019.  On 04/17/2019 she was noted to have negative work-up and treated with Reglan, Haldol, Famotidine, and Zofran with improvement in symptoms.  Discharged home with Reglan script.  At baseline pt has T2DM, CHF, HTN, HLD, CKD 3, and anemia.  Followed by GI, endocrinology, cardiology, and hematology.  Last seen by cardiology 02/01/2019 and hematology 02/15/2019, receiving iron tranfusions.  Last visit with Dr. Gabriel [name] was and her A1C 01/05/2019 was 9.8.  Last GI visit 01/06/2019, but she is talking to provider via MyChart with last visit Sunday on telephone.  Reports she feels better, but has not been eating much and is able to keep water down now.  She feels Reglan is helping with discomfort.  Endorses this discomfort has been going on for a "long time" with episodes of similar presentation. States GI has told her it is not gastroparesis, but is chronic IBS. Time since discharge: discharged 04/17/2019 Hospital/facility: [facility] Diagnosis: abdominal pain and nonintractable vomitiing Procedures/tests: CBC, CMP, Lipase, Troponin, and CT scan Consultants: none New medications: Reglan Discharge instructions:  Follow-up with PCP or GI Status: better  CT SCAN 04/16/2019: IMPRESSION: No acute abnormality noted. Interpreted by Dr. Inez [name]  DIABETES  Followed by Dr. Gabriel [name].  January A1C 9.8%.  Needs to reschedule with her as she was sick during recent appointment.  Will obtain labs today to forward to Dr. Gabriel [name] for review.  Was switched to [place] and reports pen is not working.  Currently has one pen left and it occasionally gets stuck, so has missed doses.  Continues on Glimepiride, Farxiga, Tresiba (recently changed to this due to no coverage of Lantus), and Novolog TID.  Brought CCM PharmD Alphonzo Severance into room during appointment.  Referral placed to CCM team to assist with current medication regimen and determine whether patient can return to Lantus, which she preferred and did not have pen issues with.  Patient reports she did tell pharmacist about pens and he told her to "call company to report".  She has been using Novolog more, due to pens not working and having elevated BS.  Catie and I discussed with her the difference between Novlog and Lantus/Tresiba.  Discussed risk for hypoglycemia if patient is taking Novolog, even when she does not eat a meal at meal time, which patient reports she does. Hypoglycemic episodes: yes, x one episode of BS 68 Polydipsia/polyuria: no Visual disturbance: no Chest pain: no Paresthesias: no Glucose Monitoring: yes  Accucheck frequency: BID  Fasting glucose: this morning 108, 270's recently at baseline as insulin pen not working  Post prandial:  Evening: 130's last 2-3 days, has been 150  Before meals: Taking Insulin?: yes  Long acting insulin:Tresiba 60  units (although pens have not been working), previously Lantus  Short acting insulin: Novolog TID prior to meals, varies on dose Blood Pressure Monitoring: rarely Retinal Examination: Up to Date Foot Exam: Not up to Date, next visit Pneumovax: Up to Date Influenza: Up to Date Aspirin: yes   HYPERTENSION / HYPERLIPIDEMIA Continues to be followed by cardiology.  On Irbesartan, Metoprolol, Isosorbide Mono, Lasix, Plavix, ASA, and PRN NTG (no  recent use per report).  No current statin, has documentation of poorly tolerance.  Cardiology notes on review have noted discussion of injectables. Satisfied with current treatment? yes Duration of hypertension: chronic BP monitoring frequency: daily BP range: often in 120-130/80 range per report BP medication side effects: no Duration of hyperlipidemia: chronic Aspirin: yes Recent stressors: no Recurrent headaches: no Visual changes: no Palpitations: no Dyspnea: no Chest pain: no Lower extremity edema: no Dizzy/lightheaded: no   Relevant past medical, surgical, family and social history reviewed and updated as indicated. Interim medical history since our last visit reviewed. Allergies and medications reviewed and updated.  Review of Systems  Constitutional: Negative for activity change, appetite change, diaphoresis, fatigue and fever.  Respiratory: Negative for cough, chest tightness and shortness of breath.   Cardiovascular: Negative for chest pain, palpitations and leg swelling.  Gastrointestinal: Positive for abdominal pain (occasional, but improved) and nausea (occasional, but improved). Negative for abdominal distention, constipation, diarrhea and vomiting.  Endocrine: Negative for cold intolerance, heat intolerance, polydipsia, polyphagia and polyuria.  Neurological: Negative for dizziness, syncope, weakness, light-headedness, numbness and headaches.  Psychiatric/Behavioral: Negative.     Per HPI unless specifically indicated above     Objective:    BP 126/87   Pulse 88   Temp 98.5 F (36.9 C) (Oral)   Ht '5\' 5"'$  (1.651 m)   Wt 268 lb (121.6 kg)   SpO2 100%   BMI 44.60 kg/m   Wt Readings from Last 3 Encounters:  04/21/19 268 lb (121.6 kg)  04/17/19 279 lb 15.8 oz (127 kg)  04/16/19 280 lb (127 kg)    Physical Exam Vitals signs and nursing note reviewed.  Constitutional:      General: She is awake.     Appearance: She is well-developed.  HENT:     Head:  Normocephalic.     Right Ear: Hearing normal.     Left Ear: Hearing normal.     Nose: Nose normal.     Mouth/Throat:     Mouth: Mucous membranes are moist.  Eyes:     General: Lids are normal.        Right eye: No discharge.        Left eye: No discharge.     Conjunctiva/sclera: Conjunctivae normal.     Pupils: Pupils are equal, round, and reactive to light.  Neck:     Musculoskeletal: Normal range of motion and neck supple.     Thyroid: No thyromegaly.     Vascular: No carotid bruit.  Cardiovascular:     Rate and Rhythm: Normal rate and regular rhythm.     Heart sounds: Normal heart sounds. No murmur. No gallop.   Pulmonary:     Effort: Pulmonary effort is normal.     Breath sounds: Normal breath sounds.  Abdominal:     General: Bowel sounds are normal. There is no distension.     Palpations: Abdomen is soft. There is no hepatomegaly or splenomegaly.     Tenderness: There is generalized abdominal tenderness. There is no right CVA tenderness, left CVA  tenderness, guarding or rebound. Negative signs include Murphy's sign.     Comments: Mild diffuse tenderness with palpation, greater in epigastric region.  Musculoskeletal:     Right lower leg: No edema.     Left lower leg: No edema.  Lymphadenopathy:     Cervical: No cervical adenopathy.  Skin:    General: Skin is warm and dry.  Neurological:     Mental Status: She is alert and oriented to person, place, and time.  Psychiatric:        Attention and Perception: Attention normal.        Mood and Affect: Mood normal.        Behavior: Behavior normal. Behavior is cooperative.        Thought Content: Thought content normal.        Judgment: Judgment normal.     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  BP at goal. Continue to collaborate with cardiology.  Continue current medication regimen.  CMP today.  Urine micro with ALB 150 and A:C 30-300.  On Irbesartan for kidney protection.        Digestive   Diabetic gastroparesis associated with type 2 diabetes mellitus ([place]) - Primary    Continue to collaborate with GI and endocrinology.  Continue Reglan at this time.  Obtain labs today.      Relevant Medications   TRESIBA FLEXTOUCH 200 UNIT/ML SOPN     Endocrine   Hyperlipidemia associated with type 2 diabetes mellitus (HCC)    Chronic, ongoing.  Poor statin tolerance with myalgia.  Followed by cardiology with discussion of PCSK-9.  Will collaborate with CCM pharmacist to determine if assistance available for these.  Lipid panel today.      Relevant Medications   TRESIBA FLEXTOUCH 200 UNIT/ML SOPN   Other Relevant Orders   Comp Met (CMET)   Lipid Panel w/o Chol/HDL Ratio   Poorly controlled type 2 diabetes mellitus (HCC)    Chronic, ongoing.  Followed by Dr. Gabriel [name] and missed recent labs/appointment.  Will obtain labs today and forward to Dr. Gabriel [name].  Most recent A1C in January 9.8%.  Continue to collaborate with endocrinology and CCM team.  Return in 3 months.      Relevant Medications   TRESIBA FLEXTOUCH 200 UNIT/ML SOPN   Other Relevant Orders   HgB A1c   Microalbumin, Urine Waived (Completed)       Follow up plan: Return in about 3 months (around 07/22/2019) for T2DM, HTN/HLD, CHF, CKD.

## 2019-04-21 NOTE — Chronic Care Management (AMB) (Signed)
  Chronic Care Management   Follow Up Note   04/21/2019 Name: [name] [name] MRN: [id number] DOB: [birth year]  Referred by: [name] Lick, NP Reason for referral : Chronic Care Management (Medication Management)   [name] [name] is a 38 y.o. year old female who is a primary care patient of Cannady, [name] Faster, NP. The CCM team was consulted for assistance with chronic disease management and care coordination needs.    Saw patient face to face during PCP appointment.   Review of patient status, including review of consultants reports, relevant laboratory and other test results, and collaboration with appropriate care team members and the patient's provider was performed as part of comprehensive patient evaluation and provision of chronic care management services.    Goals Addressed            This Visit's Progress     Patient Stated   . "My [name] [name] pens don't work" (pt-stated)       Current Barriers:  Marland Kitchen Medication non-adherence due to pen malfunction. Patient reports that [name] [name] pens would sometimes "get stuck" so she was unable to utilize the full quantity of the pen.  . She notes that endocrinology switched from [name] [name] to [name] [name] a few months ago due to formulary preference. Can see in [place] that they had received some sort of notification that [name] [name] was preferred over [name] [name]  Pharmacist Clinical Goal(s):  Marland Kitchen Over the next 14 days, patient will work with PCP and CCm team to address needs related to medication access  Interventions: . Patient noted that she has Medicare and separate Medicaid. Reviewed Medicaid formulary, appears [name] [name] is preferred. However, contacted [place] - appears patient has a Medicare Part D plan on file. Per Epic review, can see that she has a Musician. Will review this with patient moving forward. . [place] pharmacist noted that she remembered speaking to [name] [name] about it. If pens get  stuck, the patient can call the manufacturer to report it, and the manufacturer will give coupon card information to the pharmacy to give the patient a replacement supply.  Marland Kitchen However, pharmacist had a prescription on file for [name] [name] that also is covered by the patient's Part D plan. She will fill the [name] [name] for the patient, since the patient prefers this medication . Contacted patient; informed her of the above. She voiced understanding.   Patient Self Care Activities:  . Currently UNABLE TO independently communicate needs with providers  Initial goal documentation        Plan:  - Will have a telephonic appointment with patient as previously scheduled on 04/28/2019 at 3 pm. Patient aware to have medications and blood sugars available for review.   [name] [name], PharmD Clinical Pharmacist [place] [phone]

## 2019-04-22 ENCOUNTER — Telehealth: Payer: Self-pay | Admitting: Nurse Practitioner

## 2019-04-22 LAB — COMPREHENSIVE METABOLIC PANEL
ALT: 15 IU/L (ref 0–32)
AST: 14 IU/L (ref 0–40)
Albumin/Globulin Ratio: 1.2 (ref 1.2–2.2)
Albumin: 3.5 g/dL — ABNORMAL LOW (ref 3.8–4.8)
Alkaline Phosphatase: 242 IU/L — ABNORMAL HIGH (ref 39–117)
BUN/Creatinine Ratio: 14 (ref 9–23)
BUN: 29 mg/dL — ABNORMAL HIGH (ref 6–20)
Bilirubin Total: 0.2 mg/dL (ref 0.0–1.2)
CO2: 23 mmol/L (ref 20–29)
Calcium: 9 mg/dL (ref 8.7–10.2)
Chloride: 97 mmol/L (ref 96–106)
Creatinine, Ser: 2.02 mg/dL — ABNORMAL HIGH (ref 0.57–1.00)
GFR calc Af Amer: 35 mL/min/{1.73_m2} — ABNORMAL LOW (ref >59)
GFR calc non Af Amer: 30 mL/min/{1.73_m2} — ABNORMAL LOW (ref >59)
Globulin, Total: 2.9 g/dL (ref 1.5–4.5)
Glucose: 153 mg/dL — ABNORMAL HIGH (ref 65–99)
Potassium: 4.6 mmol/L (ref 3.5–5.2)
Sodium: 136 mmol/L (ref 134–144)
Total Protein: 6.4 g/dL (ref 6.0–8.5)

## 2019-04-22 LAB — LIPID PANEL W/O CHOL/HDL RATIO
Cholesterol, Total: 307 mg/dL — ABNORMAL HIGH (ref 100–199)
HDL: 40 mg/dL (ref >39)
LDL Calculated: 237 mg/dL — ABNORMAL HIGH (ref 0–99)
Triglycerides: 149 mg/dL (ref 0–149)
VLDL Cholesterol Cal: 30 mg/dL (ref 5–40)

## 2019-04-22 LAB — HEMOGLOBIN A1C
Est. average glucose Bld gHb Est-mCnc: 226 mg/dL
Hgb A1c MFr Bld: 9.5 % — ABNORMAL HIGH (ref 4.8–5.6)

## 2019-04-22 NOTE — Telephone Encounter (Signed)
Spoke to patient via telephone and reviewed lab results.  A1C trending down at 9.5%, patient has not been taking long acting insulin regularly due to pen issues.  Will not adjust medications at this time.  Will call Dr. Joycie Peek office and alert her that lab was performed.  Kidney function slightly declined this visit, she has been sick and was not hydrating well.  Have recommended increase oral hydration and will recheck at next visit , if continued decline will place nephrology referral as she is not currently seeing them.  Cholesterol levels remain elevated, will speak to Louanne Skye PharmD with CCM team and determine whether patient can get assistance for PSK-9 injectables as she did not tolerate statins.  Discussed with patient to further review with her GI provider recent CT scan results.  On review a small sliding hiatal hernia was noted and patient does complain of epigastric pain and frequent belching/gas discomfort.  This was not noted on abdominal CT May 2019 or imaging 2018, questionable new finding.  She is going to further discuss with her GI provider for review.  Stated appreciation for call.  Left message with Dr. Joycie Peek office about A1C results and that patient was not taking [name], but CCM pharmacist was able to get patient Lantus back and restarting that.  No medication changes by PCP at this time.

## 2019-04-26 NOTE — Progress Notes (Signed)
LMOM

## 2019-04-28 ENCOUNTER — Telehealth: Payer: Self-pay

## 2019-04-28 ENCOUNTER — Ambulatory Visit: Payer: Self-pay | Admitting: Pharmacist

## 2019-04-28 NOTE — Telephone Encounter (Signed)

## 2019-04-28 NOTE — Chronic Care Management (AMB) (Signed)
  Chronic Care Management   Note  04/28/2019 Name: [name] [name] MRN: [id number] DOB: 05-30-1981  [name] [name] is a 38 y.o. year old female who is a primary care patient of Cannady, Barbaraann Faster, NP. The CCM team was consulted for assistance with chronic disease management and care coordination needs.    Contacted patient telephonically for our previously scheduled medication review. Left HIPAA compliant message for patient to return my call at her convenience.   Follow up plan: - If I do not hear back, will follow up in the next 7-10 business days  Catie Darnelle Maffucci, PharmD, [place] PGY2 Ambulatory Care Pharmacy Resident, [place] Phone: [phone]

## 2019-04-29 ENCOUNTER — Telehealth: Payer: Self-pay

## 2019-04-29 ENCOUNTER — Other Ambulatory Visit: Payer: Self-pay

## 2019-04-29 ENCOUNTER — Encounter: Payer: Self-pay | Admitting: Cardiovascular Disease

## 2019-04-29 ENCOUNTER — Telehealth (INDEPENDENT_AMBULATORY_CARE_PROVIDER_SITE_OTHER): Payer: Medicare Other | Admitting: Cardiovascular Disease

## 2019-04-29 VITALS — HR 114 | Ht 65.0 in | Wt 268.0 lb

## 2019-04-29 DIAGNOSIS — I25118 Atherosclerotic heart disease of native coronary artery with other forms of angina pectoris: Secondary | ICD-10-CM

## 2019-04-29 DIAGNOSIS — E785 Hyperlipidemia, unspecified: Secondary | ICD-10-CM

## 2019-04-29 DIAGNOSIS — I10 Essential (primary) hypertension: Secondary | ICD-10-CM

## 2019-04-29 MED ORDER — METOPROLOL TARTRATE 100 MG PO TABS: 100.0000 mg | ORAL_TABLET | Freq: Two times a day (BID) | ORAL | 6 refills | Status: DC

## 2019-04-29 MED ORDER — IRBESARTAN 75 MG PO TABS: 150.0000 mg | ORAL_TABLET | Freq: Every day | ORAL | 6 refills | Status: DC

## 2019-04-29 MED ORDER — IRBESARTAN 75 MG PO TABS: 75.0000 mg | ORAL_TABLET | Freq: Every day | ORAL | 6 refills | Status: DC

## 2019-04-29 NOTE — Patient Instructions (Signed)
Medication Instructions:  Increase metoprolol to 100 mg twice daily Decrease irbesartan 75 mg once daily.  New prescriptions were sent to your [facility]  If you need a refill on your cardiac medications before your next appointment, please call your pharmacy.   Lab work: None If you have labs (blood work) drawn today and your tests are completely normal, you will receive your results only by: Marland Kitchen MyChart Message (if you have MyChart) OR . A paper copy in the mail If you have any lab test that is abnormal or we need to change your treatment, we will call you to review the results.  Testing/Procedures: None  Follow-Up: Schedule office follow-up visit in 1 month

## 2019-04-29 NOTE — Progress Notes (Signed)
Virtual Visit via Video Note   This visit type was conducted due to national recommendations for restrictions regarding the COVID-19 Pandemic (e.g. social distancing) in an effort to limit this patient's exposure and mitigate transmission in our community.  Due to her co-morbid illnesses, this patient is at least at moderate risk for complications without adequate follow up.  This format is felt to be most appropriate for this patient at this time.  All issues noted in this document were discussed and addressed.  A limited physical exam was performed with this format.  Please refer to the patient's chart for her consent to telehealth for [facility].   Date:  04/29/2019   ID:  [name], DOB 1981-09-12, MRN [id number]  Patient Location: Home Provider Location: Office  PCP:  Venita Lick, NP  Cardiologist:  Kathlyn Sacramento, MD  Electrophysiologist:  None   Evaluation Performed:  Follow-Up Visit  Chief Complaint: Vomiting and palpitations  History of Present Illness:    Shani Fitch is a 38 y.o. female was seen via video visit for follow-up regarding coronary artery disease. She has complex past medical history including type 2 diabetes mellitus which was diagnosed 2002, diabetic gastroparesis with chronic nausea and vomiting, coronary artery disease, ischemic cardiomyopathy with subsequent normalization of LV function, stage II-III chronic kidney disease, hypertension, hyperlipidemia, and obesity.  Cardiac history dates back to April 2016, when she was hospitalized with chest pain and non-STEMI.  She was found to have LV dysfunction with an EF of 30 to 35%, and underwent diagnostic catheterization revealing severe left circumflex and OM1 disease.  Both areas were successfully treated with drug-eluting stents.  Follow-up catheterization in May 2016 revealed patency of both stents with normalization of LV function by ventriculography.   Stress testing was performed in March 2017  which was nonischemic and low risk.  She had recurrent angina in December 2019.  Cardiac catheterization showed patent stent in OM1, focal in-stent restenosis in the left circumflex at the origin of OM 2 and new significant stenosis in the proximal to mid right coronary artery.  I performed balloon angioplasty of the mid left circumflex.  She underwent staged RCA PCI with drug-eluting stent placement. She reports improvement in angina since then.  She is dealing now with worsening vomiting.  She does not take furosemide if she is vomiting.  She also reports persistent tachycardia.  She takes her medications regularly.  Diabetes is still uncontrolled.   The patient does not have symptoms concerning for COVID-19 infection (fever, chills, cough, or new shortness of breath).    Past Medical History:  Diagnosis Date  . CAD (coronary artery disease)    a. 03/2015 NSTEMI/PCI: LCX 131m (DES), OM1 100p (DES - vessel labeled Ramus in subsequent cath report). Minor irregs to LAD/RCA; b. 04/2015 Cath: LM nl, LAD 40p, RI patent stent, LCX patent stent, RCA nl, EF 55-65%; c. 02/2016 MV: basal inferolateral and mid inferolateral defect/infarct w/o ischemia. EF 55-65%; d. 11/2018 PCI: LM 30ost, LAD 30p, LCX 95p/m (PTCA->20%), OM1 10ost, OM2 40ost, RCA 85p.  Marland Kitchen Chronic abdominal pain   . Chronic diastolic (congestive) heart failure ([place])    a. 03/2015 Echo: EF 30-35%, mild concentric LVH, severe HK of inf, inflat, & lat walls, mod MR, mild TR;  b. 04/2015 LV Gram: EF 55-65%; c. 09/2017 Echo: EF 55-60%, no rwma. Mild MR; d. 11/2018 Echo: Ef 60-65%, no rwma.  . CKD (chronic kidney disease), stage III ([place])   . Diabetes type 2,  controlled ([place])    a. since 2002   . Diabetic gastroparesis ([facility])    a. chronic nausea/vomiting.  . Diverticulosis, sigmoid 07/2016  . Heavy menses    a. H/O IUD - expired in 2014 - remains in place.  . Hyperlipidemia   . Hypertension   . Iron deficiency anemia   . Ischemic cardiomyopathy  (resolved)    a. 03/2015 EF 30-35% post NSTEMI;  b. 04/2015 EF 55-65% on LV gram; c. 09/2017 Echo: EF 55-60%; d. 11/2018 Echo: Ef 60-65%.  . Obesity   . Tobacco abuse    a. quit 03/2015.  Marland Kitchen Uterine fibroid   . Vertigo   . Vitamin D deficiency    Past Surgical History:  Procedure Laterality Date  . APPENDECTOMY    . CARDIAC CATHETERIZATION  4/16   x2 stent [facility]  . CARDIAC CATHETERIZATION N/A 05/05/2015   Procedure: Left Heart Cath and Coronary Angiography;  Surgeon: Peter M Martinique, MD;  Location: [place] [facility];  Service: Cardiovascular;  Laterality: N/A;  . COLONOSCOPY WITH PROPOFOL N/A 07/31/2016   Procedure: COLONOSCOPY WITH PROPOFOL;  Surgeon: Lollie Sails, MD;  Location: [facility] ENDOSCOPY;  Service: Endoscopy;  Laterality: N/A;  . CORONARY BALLOON ANGIOPLASTY N/A 12/04/2018   Procedure: CORONARY BALLOON ANGIOPLASTY;  Surgeon: Wellington Hampshire, MD;  Location: [place] [facility];  Service: Cardiovascular;  Laterality: N/A;  . CORONARY STENT INTERVENTION N/A 01/12/2019   Procedure: CORONARY STENT INTERVENTION;  Surgeon: [name], MD;  Location: [place] [facility];  Service: Cardiovascular;  Laterality: N/A;  . ESOPHAGOGASTRODUODENOSCOPY (EGD) WITH PROPOFOL N/A 07/31/2016   Procedure: ESOPHAGOGASTRODUODENOSCOPY (EGD) WITH PROPOFOL;  Surgeon: Lollie Sails, MD;  Location: [facility] ENDOSCOPY;  Service: Endoscopy;  Laterality: N/A;  . INTRAVASCULAR PRESSURE WIRE/FFR STUDY N/A 12/04/2018   Procedure: INTRAVASCULAR PRESSURE WIRE/FFR STUDY;  Surgeon: Wellington Hampshire, MD;  Location: [place] [facility];  Service: Cardiovascular;  Laterality: N/A;  . LAPAROSCOPIC APPENDECTOMY N/A 08/07/2016   Procedure: APPENDECTOMY LAPAROSCOPIC;  Surgeon: Hubbard Robinson, MD;  Location: [facility] ORS;  Service: General;  Laterality: N/A;  . LEFT HEART CATH AND CORONARY ANGIOGRAPHY Left 12/04/2018   Procedure: LEFT HEART CATH AND CORONARY ANGIOGRAPHY;  Surgeon: Wellington Hampshire, MD;  Location: [place] [facility];  Service: Cardiovascular;  Laterality: Left;  . LEFT HEART CATH AND CORONARY ANGIOGRAPHY N/A 01/12/2019   Procedure: LEFT HEART CATH AND CORONARY ANGIOGRAPHY;  Surgeon: [name], MD;  Location: [place] [facility];  Service: Cardiovascular;  Laterality: N/A;  . MOUTH SURGERY       Current Meds  Medication Sig  . amitriptyline (ELAVIL) 25 MG tablet Take 25 mg by mouth at bedtime.  Marland Kitchen aspirin EC 81 MG tablet Take 81 mg by mouth daily.  . benzonatate (TESSALON) 100 MG capsule Take 1 capsule (100 mg total) by mouth 2 (two) times daily as needed for cough.  . Cholecalciferol 2000 units TABS Take 2,000 Units by mouth daily.   . clopidogrel (PLAVIX) 75 MG tablet Take 4 tabs at 7pm tonight (12/28), then 1 tab daily beginning 12/29. (Patient taking differently: Take 75 mg by mouth daily. )  . DULoxetine (CYMBALTA) 30 MG capsule Take 60 mg by mouth daily.   Marland Kitchen etonogestrel (NEXPLANON) 68 MG IMPL implant 1 each by Subdermal route once.  Marland Kitchen FARXIGA 10 MG TABS tablet Take 10 mg by mouth daily.   . furosemide (LASIX) 40 MG tablet Take 1 tablet (40 mg total) by mouth every other day.  Marland Kitchen  glimepiride (AMARYL) 4 MG tablet TAKE 1 TABLET BY MOUTH ONCE DAILY (Patient taking differently: Take 4 mg by mouth daily. )  . glucose blood (ACCU-CHEK ACTIVE STRIPS) test strip Use as instructed  . insulin aspart (NOVOLOG) 100 UNIT/ML injection Inject 10 Units into the skin 3 (three) times daily with meals.  . insulin glargine (LANTUS) 100 UNIT/ML injection Inject 60 Units into the skin daily.  . Insulin Pen Needle 32G X 4 MM MISC 1 Units by Does not apply route every morning. Pen needles  . irbesartan (AVAPRO) 150 MG tablet Take 150 mg by mouth daily.  . isosorbide mononitrate (IMDUR) 30 MG 24 hr tablet Take 2 tablets (60 mg total) by mouth daily.  Marland Kitchen loperamide (IMODIUM A-D) 2 MG tablet Take 8-12 mg by mouth daily as needed for diarrhea or loose stools.  . meclizine (ANTIVERT) 25 MG tablet Take 25 mg  by mouth 3 (three) times daily as needed for dizziness.  . metoCLOPramide (REGLAN) 10 MG tablet Take 1 tablet (10 mg total) by mouth 3 (three) times daily with meals for 10 days.  . metoprolol tartrate (LOPRESSOR) 50 MG tablet Take 1 tablet (50 mg total) by mouth 2 (two) times daily.  . mirtazapine (REMERON) 30 MG tablet Take 60 mg by mouth daily.   . nitroGLYCERIN (NITROSTAT) 0.4 MG SL tablet Take 1 tab under your tongue while sitting for chest pain, If no relief may repeat, one tab every 5 min up to 3 tabs total over 15 mins.  Marland Kitchen ondansetron (ZOFRAN) 4 MG tablet Take 1 tablet (4 mg total) by mouth every 8 (eight) hours as needed for nausea or vomiting.  . pantoprazole (PROTONIX) 40 MG tablet TAKE 1 TABLET BY MOUTH ONCE DAILY AT 6AM (Patient taking differently: Take 40 mg by mouth 2 (two) times daily. )  . polyethylene glycol (MIRALAX / GLYCOLAX) 17 g packet Take 2 packets now and then 1 packet each day     Allergies:   Lisinopril; Rosemary oil; Shellfish allergy; Other; and Metformin and related   Social History   Tobacco Use  . Smoking status: Former Smoker    Years: 15.00    Last attempt to quit: 03/10/2015    Years since quitting: 4.1  . Smokeless tobacco: Never Used  Substance Use Topics  . Alcohol use: No    Alcohol/week: 0.0 standard drinks  . Drug use: No     Family Hx: The patient's family history includes Cancer in her maternal grandfather and maternal grandmother; Cancer - Cervical in her maternal aunt; Diabetes in her father, paternal grandfather, and paternal grandmother.  ROS:   Please see the history of present illness.     All other systems reviewed and are negative.   Prior CV studies:   The following studies were reviewed today:    Labs/Other Tests and Data Reviewed:    EKG:  No ECG reviewed.  Recent Labs: 05/01/2018: Magnesium 1.9 04/16/2019: B Natriuretic Peptide 50.0 04/17/2019: Hemoglobin 12.0; Platelets 447 04/21/2019: ALT 15; BUN 29; Creatinine, Ser  2.02; Potassium 4.6; Sodium 136   Recent Lipid Panel Lab Results  Component Value Date/Time   CHOL 307 (H) 04/21/2019 02:06 PM   CHOL 106 08/30/2015 02:39 PM   CHOL 133 03/31/2015 02:25 AM   TRIG 149 04/21/2019 02:06 PM   TRIG 46 08/30/2015 02:39 PM   TRIG 77 03/31/2015 02:25 AM   HDL 40 04/21/2019 02:06 PM   HDL 26 (L) 03/31/2015 02:25 AM   CHOLHDL 4.6 (H)  12/17/2018 11:45 AM   LDLCALC 237 (H) 04/21/2019 02:06 PM   LDLCALC 92 03/31/2015 02:25 AM   LDLDIRECT 163 (H) 12/17/2018 11:45 AM    Wt Readings from Last 3 Encounters:  04/29/19 268 lb (121.6 kg)  04/21/19 268 lb (121.6 kg)  04/17/19 279 lb 15.8 oz (127 kg)     Objective:    Vital Signs:  Pulse (!) 114   Ht 5\' 5"  (1.651 m)   Wt 268 lb (121.6 kg)   BMI 44.60 kg/m    VITAL SIGNS:  reviewed GEN:  no acute distress EYES:  sclerae anicteric, EOMI - Extraocular Movements Intact RESPIRATORY:  normal respiratory effort, symmetric expansion SKIN:  no rash, lesions or ulcers. MUSCULOSKELETAL:  no obvious deformities. NEURO:  alert and oriented x 3, no obvious focal deficit PSYCH:  normal affect  ASSESSMENT & PLAN:    1.  Coronary artery disease with other forms of angina: Improved symptoms overall after left circumflex and RCA revascularization most recently in February.  Continue lifelong dual antiplatelet therapy as tolerated.  She does continue to have resting tachycardia with some occasional chest discomfort.  Thus, increase metoprolol to 100 mg twice daily.  2.  Chronic diastolic congestive heart failure: Most recent echo in December showed normal LV systolic function.  She is on furosemide 40 mg every other day and she knows to hold this medication if she is vomiting.  She has not taken it in the last few days.  3.  Essential hypertension: Given the increase in the dose of metoprolol, I elected to decrease irbesartan to 75 mg once daily  4.  Hyperlipidemia: She did not tolerate rosuvastatin and atorvastatin due  to myalgia.  Recent labs showed LDL greater than 200 and given her extensive coronary artery disease and diabetes, there is a strong indication for treatment with a PCSK9 inhibitor.  We will see if we can get approval.  5.  CKD II-III: Slight worsening of renal function recently with creatinine up to 2 in the setting of vomiting.  I advised her to stay hydrated.   COVID-19 Education: The signs and symptoms of COVID-19 were discussed with the patient and how to seek care for testing (follow up with PCP or arrange E-visit).  The importance of social distancing was discussed today.  Time:   Today, I have spent 12 minutes with the patient with telehealth technology discussing the above problems.     Medication Adjustments/Labs and Tests Ordered: Current medicines are reviewed at length with the patient today.  Concerns regarding medicines are outlined above.   Tests Ordered: No orders of the defined types were placed in this encounter.   Medication Changes: No orders of the defined types were placed in this encounter.   Disposition:  Follow up in 1 month(s)  Signed, Kathlyn Sacramento, MD  04/29/2019 10:15 AM    [place]

## 2019-04-29 NOTE — Telephone Encounter (Signed)
The pharmacy has been called back. Per the office notes:  Given the increase in the dose of metoprolol, I elected to decrease irbesartan to 75 mg once daily  The pharmacy has taken a verbal for the new order and does not require a new script to be sent in.

## 2019-04-29 NOTE — Telephone Encounter (Signed)
Pharmacy needs clarification on Avapro 75 mg directions read decreased dose of  150 MG.   LOV shows decrease to once a day. Please advise.

## 2019-05-07 ENCOUNTER — Ambulatory Visit: Payer: Self-pay | Admitting: Pharmacist

## 2019-05-07 ENCOUNTER — Telehealth: Payer: Self-pay

## 2019-05-07 NOTE — Chronic Care Management (AMB) (Signed)
  Chronic Care Management   Note  05/07/2019 Name: [name] [name] MRN: [id number] DOB: 1981-01-15  [name] [name] is a 38 y.o. year old female who is a primary care patient of Cannady, Barbaraann Faster, NP. The CCM team was consulted for assistance with chronic disease management and care coordination needs.    Unsuccessful outreach attempt #2 to patient for medication review and medication management support. HIPAA compliant message left asking patient to return my call at her convenience.   Follow up plan: - Will follow up within 5 business days if I do not hear back  Catie Darnelle Maffucci, PharmD Clinical Pharmacist [place] [phone]

## 2019-05-07 NOTE — Progress Notes (Signed)
Opened in error

## 2019-05-12 ENCOUNTER — Ambulatory Visit: Payer: Self-pay | Admitting: Pharmacist

## 2019-05-12 ENCOUNTER — Telehealth: Payer: Self-pay

## 2019-05-12 NOTE — Chronic Care Management (AMB) (Signed)
  Chronic Care Management   Note  05/12/2019 Name: [name] [name] MRN: [id number] DOB: [birth year]  [name] [name] is a 38 y.o. year old female who is a primary care patient of Cannady, Barbaraann Faster, NP. The CCM team was consulted for assistance with chronic disease management and care coordination needs.    Unsuccessful outreach attempt #3. Left HIPAA compliant message asking patient to give me a call back if she is interested in engaging in chronic care management.   Catie Darnelle Maffucci, PharmD Clinical Pharmacist [place] [phone]

## 2019-05-13 ENCOUNTER — Telehealth: Payer: Self-pay

## 2019-05-13 NOTE — Telephone Encounter (Signed)
LMOM

## 2019-05-13 NOTE — Telephone Encounter (Signed)
lmom 

## 2019-05-14 ENCOUNTER — Other Ambulatory Visit: Payer: Self-pay

## 2019-05-14 MED ORDER — INSULIN PEN NEEDLE 32G X 4 MM MISC: 1.0000 [IU] | Freq: Every morning | 3 refills | Status: DC

## 2019-05-16 NOTE — Progress Notes (Deleted)
[place]  [phone] [phone]) [phone]  ID: [name] [name] OB: 12-25-1980  MR#: [id number]  JZP#:[id number]  Patient Care Team: Venita Lick, NP as PCP - General (Nurse Practitioner) Wellington Hampshire, MD as PCP - Cardiology (Cardiology) Wellington Hampshire, MD as Consulting Physician (Cardiology) Alisa Graff, FNP as Nurse Practitioner (Family Medicine) De Hollingshead, [facility] as Pharmacist (Pharmacist)  CHIEF COMPLAINT: Iron deficiency anemia.  INTERVAL HISTORY: Patient returns to clinic today for repeat laboratory work, further evaluation, and initiation of IV Feraheme.  She continues to have chronic weakness and fatigue, but otherwise feels well.  She has no neurologic complaints.  She denies any recent fevers or illnesses.  She has a good appetite and denies weight loss.  She has no chest pain or shortness of breath.  She denies any nausea, vomiting, constipation, or diarrhea.  She denies any melena or hematochezia.  She has no urinary complaints.  Patient offers no further specific complaints today.  REVIEW OF SYSTEMS:   Review of Systems  Constitutional: Positive for malaise/fatigue. Negative for fever and weight loss.  Respiratory: Negative.  Negative for cough, hemoptysis and shortness of breath.   Cardiovascular: Negative.  Negative for chest pain and leg swelling.  Gastrointestinal: Negative.  Negative for abdominal pain, blood in stool and melena.  Genitourinary: Negative.  Negative for hematuria.  Musculoskeletal: Negative.  Negative for back pain.  Skin: Negative.  Negative for rash.  Neurological: Positive for weakness. Negative for dizziness, focal weakness and headaches.  Psychiatric/Behavioral: Negative.  The patient is not nervous/anxious.     As per HPI. Otherwise, a complete review of systems is negative.  PAST MEDICAL HISTORY: Past Medical History:  Diagnosis Date   CAD (coronary artery disease)    a. 03/2015  NSTEMI/PCI: LCX 121m (DES), OM1 100p (DES - vessel labeled Ramus in subsequent cath report). Minor irregs to LAD/RCA; b. 04/2015 Cath: LM nl, LAD 40p, RI patent stent, LCX patent stent, RCA nl, EF 55-65%; c. 02/2016 MV: basal inferolateral and mid inferolateral defect/infarct w/o ischemia. EF 55-65%; d. 11/2018 PCI: LM 30ost, LAD 30p, LCX 95p/m (PTCA->20%), OM1 10ost, OM2 40ost, RCA 85p.   Chronic abdominal pain    Chronic diastolic (congestive) heart failure ([place])    a. 03/2015 Echo: EF 30-35%, mild concentric LVH, severe HK of inf, inflat, & lat walls, mod MR, mild TR;  b. 04/2015 LV Gram: EF 55-65%; c. 09/2017 Echo: EF 55-60%, no rwma. Mild MR; d. 11/2018 Echo: Ef 60-65%, no rwma.   CKD (chronic kidney disease), stage III ([place])    Diabetes type 2, controlled ([place])    a. since 2002    Diabetic gastroparesis ([facility])    a. chronic nausea/vomiting.   Diverticulosis, sigmoid 07/2016   Heavy menses    a. H/O IUD - expired in 2014 - remains in place.   Hyperlipidemia    Hypertension    Iron deficiency anemia    Ischemic cardiomyopathy (resolved)    a. 03/2015 EF 30-35% post NSTEMI;  b. 04/2015 EF 55-65% on LV gram; c. 09/2017 Echo: EF 55-60%; d. 11/2018 Echo: Ef 60-65%.   Obesity    Tobacco abuse    a. quit 03/2015.   Uterine fibroid    Vertigo    Vitamin D deficiency     PAST SURGICAL HISTORY: Past Surgical History:  Procedure Laterality Date   APPENDECTOMY     CARDIAC CATHETERIZATION  4/16   x2 stent [place] N/A 05/05/2015  Procedure: Left Heart Cath and Coronary Angiography;  Surgeon: Peter M Martinique, MD;  Location: [place] [facility];  Service: Cardiovascular;  Laterality: N/A;   COLONOSCOPY WITH PROPOFOL N/A 07/31/2016   Procedure: COLONOSCOPY WITH PROPOFOL;  Surgeon: Lollie Sails, MD;  Location: [facility] ENDOSCOPY;  Service: Endoscopy;  Laterality: N/A;   CORONARY BALLOON ANGIOPLASTY N/A 12/04/2018   Procedure: CORONARY BALLOON ANGIOPLASTY;   Surgeon: Wellington Hampshire, MD;  Location: [place] [facility];  Service: Cardiovascular;  Laterality: N/A;   CORONARY STENT INTERVENTION N/A 01/12/2019   Procedure: CORONARY STENT INTERVENTION;  Surgeon: [name], MD;  Location: [place] [facility];  Service: Cardiovascular;  Laterality: N/A;   ESOPHAGOGASTRODUODENOSCOPY (EGD) WITH PROPOFOL N/A 07/31/2016   Procedure: ESOPHAGOGASTRODUODENOSCOPY (EGD) WITH PROPOFOL;  Surgeon: Lollie Sails, MD;  [facility];  Service: Endoscopy;  Laterality: N/A;   INTRAVASCULAR PRESSURE WIRE/FFR STUDY N/A 12/04/2018   Procedure: INTRAVASCULAR PRESSURE WIRE/FFR STUDY;  Surgeon: Wellington Hampshire, MD;  Location: [place];  Service: Cardiovascular;  Laterality: N/A;   LAPAROSCOPIC APPENDECTOMY N/A 08/07/2016   Procedure: APPENDECTOMY LAPAROSCOPIC;  Surgeon: Hubbard Robinson, MD;  Location: [facility];  Service: General;  Laterality: N/A;   LEFT HEART CATH AND CORONARY ANGIOGRAPHY Left 12/04/2018   Procedure: LEFT HEART CATH AND CORONARY ANGIOGRAPHY;  Surgeon: Wellington Hampshire, MD;  Location: [place] [facility];  Service: Cardiovascular;  Laterality: Left;   LEFT HEART CATH AND CORONARY ANGIOGRAPHY N/A 01/12/2019   Procedure: LEFT HEART CATH AND CORONARY ANGIOGRAPHY;  Surgeon: [name], MD;  Location: [place] [facility];  Service: Cardiovascular;  Laterality: N/A;   MOUTH SURGERY      FAMILY HISTORY: Family History  Problem Relation Age of Onset   Diabetes Father    Cancer Maternal Grandmother        lung   Cancer Maternal Grandfather        prostate   Diabetes Paternal Grandfather    Diabetes Paternal Grandmother    Cancer - Cervical Maternal Aunt     ADVANCED DIRECTIVES (Y/N):  N  HEALTH MAINTENANCE: Social History   Tobacco Use   Smoking status: Former Smoker    Years: 15.00    Last attempt to quit: 03/10/2015    Years since quitting: 4.1   Smokeless tobacco: Never Used  Substance Use  Topics   Alcohol use: No    Alcohol/week: 0.0 standard drinks   Drug use: No     Colonoscopy:  PAP:  Bone density:  Lipid panel:  Allergies  Allergen Reactions   Lisinopril Anaphylaxis, Itching and Swelling   Rosemary Oil Anaphylaxis   Shellfish Allergy Itching and Swelling   Other Itching and Swelling    [place] seasoning   Metformin And Related Diarrhea, Nausea And Vomiting and Rash    Current Outpatient Medications  Medication Sig Dispense Refill   amitriptyline (ELAVIL) 25 MG tablet Take 25 mg by mouth at bedtime.     aspirin EC 81 MG tablet Take 81 mg by mouth daily.     benzonatate (TESSALON) 100 MG capsule Take 1 capsule (100 mg total) by mouth 2 (two) times daily as needed for cough. 30 capsule 12   Cholecalciferol 2000 units TABS Take 2,000 Units by mouth daily.      clopidogrel (PLAVIX) 75 MG tablet Take 4 tabs at 7pm tonight (12/28), then 1 tab daily beginning 12/29. (Patient taking differently: Take 75 mg by mouth daily. ) 90 tablet 3   DULoxetine (  CYMBALTA) 30 MG capsule Take 60 mg by mouth daily.   3   etonogestrel (NEXPLANON) 68 MG IMPL implant 1 each by Subdermal route once.     FARXIGA 10 MG TABS tablet Take 10 mg by mouth daily.   11   furosemide (LASIX) 40 MG tablet Take 1 tablet (40 mg total) by mouth every other day. 30 tablet 5   glimepiride (AMARYL) 4 MG tablet TAKE 1 TABLET BY MOUTH ONCE DAILY (Patient taking differently: Take 4 mg by mouth daily. ) 90 tablet 1   glucose blood (ACCU-CHEK ACTIVE STRIPS) test strip Use as instructed 100 each 12   insulin aspart (NOVOLOG) 100 UNIT/ML injection Inject 10 Units into the skin 3 (three) times daily with meals. 10 mL 11   insulin glargine (LANTUS) 100 UNIT/ML injection Inject 60 Units into the skin daily.     insulin NPH-regular Human (70-30) 100 UNIT/ML injection Inject into the skin.     Insulin Pen Needle 32G X 4 MM MISC 1 Units by Does not apply route every morning. Pen needles 90 each 3     irbesartan (AVAPRO) 75 MG tablet Take 1 tablet (75 mg total) by mouth daily. 30 tablet 6   isosorbide mononitrate (IMDUR) 30 MG 24 hr tablet Take 2 tablets (60 mg total) by mouth daily. 90 tablet 3   loperamide (IMODIUM A-D) 2 MG tablet Take 8-12 mg by mouth daily as needed for diarrhea or loose stools.     meclizine (ANTIVERT) 25 MG tablet Take 25 mg by mouth 3 (three) times daily as needed for dizziness.     metoCLOPramide (REGLAN) 10 MG tablet Take 1 tablet (10 mg total) by mouth 3 (three) times daily with meals for 10 days. 30 tablet 0   metoprolol tartrate (LOPRESSOR) 100 MG tablet Take 1 tablet (100 mg total) by mouth 2 (two) times daily. 60 tablet 6   mirtazapine (REMERON) 30 MG tablet Take 60 mg by mouth daily.   3   nitroGLYCERIN (NITROSTAT) 0.4 MG SL tablet Take 1 tab under your tongue while sitting for chest pain, If no relief may repeat, one tab every 5 min up to 3 tabs total over 15 mins. 25 tablet 3   ondansetron (ZOFRAN) 4 MG tablet Take 1 tablet (4 mg total) by mouth every 8 (eight) hours as needed for nausea or vomiting. 8 tablet 0   pantoprazole (PROTONIX) 40 MG tablet TAKE 1 TABLET BY MOUTH ONCE DAILY AT 6AM (Patient taking differently: Take 40 mg by mouth 2 (two) times daily. ) 90 tablet 1   polyethylene glycol (MIRALAX / GLYCOLAX) 17 g packet Take 2 packets now and then 1 packet each day     No current facility-administered medications for this visit.     OBJECTIVE: There were no vitals filed for this visit.   There is no height or weight on file to calculate BMI.    ECOG FS:0 - Asymptomatic  General: Well-developed, well-nourished, no acute distress. Eyes: Pink conjunctiva, anicteric sclera. HEENT: Normocephalic, moist mucous membranes. Lungs: Clear to auscultation bilaterally. Heart: Regular rate and rhythm. No rubs, murmurs, or gallops. Abdomen: Soft, nontender, nondistended. No organomegaly noted, normoactive bowel sounds. Musculoskeletal: No edema,  cyanosis, or clubbing. Neuro: Alert, answering all questions appropriately. Cranial nerves grossly intact. Skin: No rashes or petechiae noted. Psych: Normal affect.  LAB RESULTS:  Lab Results  Component Value Date   NA 136 04/21/2019   K 4.6 04/21/2019   CL 97 04/21/2019  CO2 23 04/21/2019   GLUCOSE 153 (H) 04/21/2019   BUN 29 (H) 04/21/2019   CREATININE 2.02 (H) 04/21/2019   CALCIUM 9.0 04/21/2019   PROT 6.4 04/21/2019   ALBUMIN 3.5 (L) 04/21/2019   AST 14 04/21/2019   ALT 15 04/21/2019   ALKPHOS 242 (H) 04/21/2019   BILITOT 0.2 04/21/2019   GFRNONAA 30 (L) 04/21/2019   GFRAA 35 (L) 04/21/2019    Lab Results  Component Value Date   WBC 12.4 (H) 04/17/2019   NEUTROABS 7.3 04/16/2019   HGB 12.0 04/17/2019   HCT 37.3 04/17/2019   MCV 80.7 04/17/2019   PLT 447 (H) 04/17/2019   Lab Results  Component Value Date   IRON 34 02/15/2019   TIBC 324 02/15/2019   IRONPCTSAT 11 02/15/2019   Lab Results  Component Value Date   FERRITIN 23 02/15/2019    STUDIES: Ct Renal Stone Study  Result Date: 04/16/2019 CLINICAL DATA:  Periumbilical pain EXAM: CT ABDOMEN AND PELVIS WITHOUT CONTRAST TECHNIQUE: Multidetector CT imaging of the abdomen and pelvis was performed following the standard protocol without IV contrast. COMPARISON:  04/29/2018 FINDINGS: Lower chest: No acute abnormality. Hepatobiliary: No focal liver abnormality is seen. No gallstones, gallbladder wall thickening, or biliary dilatation. Pancreas: Unremarkable. No pancreatic ductal dilatation or surrounding inflammatory changes. Spleen: Normal in size without focal abnormality. Adrenals/Urinary Tract: Adrenal glands are within normal limits. Kidneys are well visualized bilaterally. No renal calculi or obstructive changes are seen. The ureters are within normal limits. The bladder is partially distended. Stomach/Bowel: The appendix has been surgically removed. No obstructive or inflammatory changes of large or small bowel  are seen. Stomach is within normal limits with the exception of a small sliding-type hiatal hernia. Vascular/Lymphatic: Aortic atherosclerosis. No enlarged abdominal or pelvic lymph nodes. Reproductive: Uterus and bilateral adnexa are unremarkable. Other: No abdominal wall hernia or abnormality. No abdominopelvic ascites. Musculoskeletal: No acute or significant osseous findings. IMPRESSION: No acute abnormality noted. Electronically Signed   By: Inez Catalina M.D.   On: 04/16/2019 22:13    ASSESSMENT: Thrombocytosis, iron deficiency anemia.  PLAN:    1.  Iron deficiency anemia: Patient's iron stores are borderline low and her hemoglobin is trending down.  She is also symptomatic.  She has been instructed to continue taking her oral iron supplementation.  Previously, the remainder of her laboratory work was either negative or within normal limits.  Proceed with 510 mg IV Feraheme today.  Return to clinic in 1 week for second infusion and then in 3 months with repeat laboratory work and further evaluation.   2.  Thrombocytosis: Resolved.  Likely secondary to iron deficiency.  Feraheme as above.  I spent a total of 30 minutes face-to-face with the patient of which greater than 50% of the visit was spent in counseling and coordination of care as detailed above.   Patient expressed understanding and was in agreement with this plan. She also understands that She can call clinic at any time with any questions, concerns, or complaints.   Cancer Staging No matching staging information was found for the patient.  Lloyd Huger, MD   05/16/2019 9:59 AM

## 2019-05-19 ENCOUNTER — Telehealth: Payer: Self-pay | Admitting: Oncology

## 2019-05-19 NOTE — Telephone Encounter (Signed)
2nd attempt to reach pt to modify appt. Messages left on VM to call office to discuss.

## 2019-05-21 ENCOUNTER — Inpatient Hospital Stay: Payer: Medicare Other

## 2019-05-21 ENCOUNTER — Inpatient Hospital Stay: Payer: Medicare Other | Admitting: Oncology

## 2019-06-08 DIAGNOSIS — E782 Mixed hyperlipidemia: Secondary | ICD-10-CM | POA: Diagnosis not present

## 2019-06-08 DIAGNOSIS — E1165 Type 2 diabetes mellitus with hyperglycemia: Secondary | ICD-10-CM | POA: Diagnosis not present

## 2019-06-08 DIAGNOSIS — Z794 Long term (current) use of insulin: Secondary | ICD-10-CM | POA: Diagnosis not present

## 2019-06-08 DIAGNOSIS — I10 Essential (primary) hypertension: Secondary | ICD-10-CM | POA: Diagnosis not present

## 2019-06-08 DIAGNOSIS — E669 Obesity, unspecified: Secondary | ICD-10-CM | POA: Diagnosis not present

## 2019-06-08 DIAGNOSIS — E1159 Type 2 diabetes mellitus with other circulatory complications: Secondary | ICD-10-CM | POA: Diagnosis not present

## 2019-06-08 DIAGNOSIS — E1169 Type 2 diabetes mellitus with other specified complication: Secondary | ICD-10-CM | POA: Diagnosis not present

## 2019-06-08 DIAGNOSIS — E1143 Type 2 diabetes mellitus with diabetic autonomic (poly)neuropathy: Secondary | ICD-10-CM | POA: Diagnosis not present

## 2019-06-18 ENCOUNTER — Inpatient Hospital Stay: Payer: Medicare Other

## 2019-06-18 ENCOUNTER — Other Ambulatory Visit: Payer: Self-pay

## 2019-06-18 ENCOUNTER — Inpatient Hospital Stay
Admission: EM | Admit: 2019-06-18 | Discharge: 2019-06-22 | DRG: 074 | Disposition: A | Discharge status: Home / Self Care | Payer: Medicare Other | Attending: Internal Medicine | Admitting: Internal Medicine

## 2019-06-18 DIAGNOSIS — K760 Fatty (change of) liver, not elsewhere classified: Secondary | ICD-10-CM | POA: Diagnosis present

## 2019-06-18 DIAGNOSIS — Z955 Presence of coronary angioplasty implant and graft: Secondary | ICD-10-CM

## 2019-06-18 DIAGNOSIS — N183 Chronic kidney disease, stage 3 (moderate): Secondary | ICD-10-CM | POA: Diagnosis present

## 2019-06-18 DIAGNOSIS — Z91013 Allergy to seafood: Secondary | ICD-10-CM | POA: Diagnosis not present

## 2019-06-18 DIAGNOSIS — I252 Old myocardial infarction: Secondary | ICD-10-CM

## 2019-06-18 DIAGNOSIS — Z87891 Personal history of nicotine dependence: Secondary | ICD-10-CM

## 2019-06-18 DIAGNOSIS — Z7189 Other specified counseling: Secondary | ICD-10-CM

## 2019-06-18 DIAGNOSIS — Z7902 Long term (current) use of antithrombotics/antiplatelets: Secondary | ICD-10-CM

## 2019-06-18 DIAGNOSIS — E1143 Type 2 diabetes mellitus with diabetic autonomic (poly)neuropathy: Secondary | ICD-10-CM | POA: Diagnosis not present

## 2019-06-18 DIAGNOSIS — Z01810 Encounter for preprocedural cardiovascular examination: Secondary | ICD-10-CM | POA: Diagnosis not present

## 2019-06-18 DIAGNOSIS — E1122 Type 2 diabetes mellitus with diabetic chronic kidney disease: Secondary | ICD-10-CM | POA: Diagnosis present

## 2019-06-18 DIAGNOSIS — I5032 Chronic diastolic (congestive) heart failure: Secondary | ICD-10-CM | POA: Diagnosis present

## 2019-06-18 DIAGNOSIS — Z794 Long term (current) use of insulin: Secondary | ICD-10-CM | POA: Diagnosis not present

## 2019-06-18 DIAGNOSIS — I130 Hypertensive heart and chronic kidney disease with heart failure and stage 1 through stage 4 chronic kidney disease, or unspecified chronic kidney disease: Secondary | ICD-10-CM | POA: Diagnosis present

## 2019-06-18 DIAGNOSIS — Z6841 Body Mass Index (BMI) 40.0 and over, adult: Secondary | ICD-10-CM | POA: Diagnosis not present

## 2019-06-18 DIAGNOSIS — Z7982 Long term (current) use of aspirin: Secondary | ICD-10-CM | POA: Diagnosis not present

## 2019-06-18 DIAGNOSIS — Z833 Family history of diabetes mellitus: Secondary | ICD-10-CM | POA: Diagnosis not present

## 2019-06-18 DIAGNOSIS — I2510 Atherosclerotic heart disease of native coronary artery without angina pectoris: Secondary | ICD-10-CM | POA: Diagnosis present

## 2019-06-18 DIAGNOSIS — E559 Vitamin D deficiency, unspecified: Secondary | ICD-10-CM | POA: Diagnosis present

## 2019-06-18 DIAGNOSIS — E785 Hyperlipidemia, unspecified: Secondary | ICD-10-CM | POA: Diagnosis present

## 2019-06-18 DIAGNOSIS — K3184 Gastroparesis: Secondary | ICD-10-CM | POA: Diagnosis present

## 2019-06-18 DIAGNOSIS — Z79899 Other long term (current) drug therapy: Secondary | ICD-10-CM | POA: Diagnosis not present

## 2019-06-18 DIAGNOSIS — K5900 Constipation, unspecified: Secondary | ICD-10-CM | POA: Diagnosis present

## 2019-06-18 DIAGNOSIS — Z1159 Encounter for screening for other viral diseases: Secondary | ICD-10-CM

## 2019-06-18 DIAGNOSIS — K8020 Calculus of gallbladder without cholecystitis without obstruction: Secondary | ICD-10-CM | POA: Diagnosis present

## 2019-06-18 DIAGNOSIS — R109 Unspecified abdominal pain: Secondary | ICD-10-CM

## 2019-06-18 DIAGNOSIS — R112 Nausea with vomiting, unspecified: Secondary | ICD-10-CM | POA: Diagnosis not present

## 2019-06-18 DIAGNOSIS — E1165 Type 2 diabetes mellitus with hyperglycemia: Secondary | ICD-10-CM | POA: Diagnosis present

## 2019-06-18 DIAGNOSIS — Z20828 Contact with and (suspected) exposure to other viral communicable diseases: Secondary | ICD-10-CM | POA: Diagnosis not present

## 2019-06-18 LAB — URINALYSIS, COMPLETE (UACMP) WITH MICROSCOPIC
Bacteria, UA: NONE SEEN
Bilirubin Urine: NEGATIVE
Glucose, UA: >=500 mg/dL — AB
Ketones, ur: 20 mg/dL — AB
Nitrite: NEGATIVE
Protein, ur: 100 mg/dL — AB
Specific Gravity, Urine: 1.013 (ref 1.005–1.030)
Squamous Epithelial / HPF: NONE SEEN (ref 0–5)
pH: 7 (ref 5.0–8.0)

## 2019-06-18 LAB — COMPREHENSIVE METABOLIC PANEL
ALT: 17 U/L (ref 0–44)
AST: 20 U/L (ref 15–41)
Albumin: 3.5 g/dL (ref 3.5–5.0)
Alkaline Phosphatase: 101 U/L (ref 38–126)
Anion gap: 13 (ref 5–15)
BUN: 19 mg/dL (ref 6–20)
CO2: 21 mmol/L — ABNORMAL LOW (ref 22–32)
Calcium: 9.4 mg/dL (ref 8.9–10.3)
Chloride: 107 mmol/L (ref 98–111)
Creatinine, Ser: 1.42 mg/dL — ABNORMAL HIGH (ref 0.44–1.00)
GFR calc Af Amer: 54 mL/min — ABNORMAL LOW (ref >60)
GFR calc non Af Amer: 46 mL/min — ABNORMAL LOW (ref >60)
Glucose, Bld: 260 mg/dL — ABNORMAL HIGH (ref 70–99)
Potassium: 3.8 mmol/L (ref 3.5–5.1)
Sodium: 141 mmol/L (ref 135–145)
Total Bilirubin: 0.5 mg/dL (ref 0.3–1.2)
Total Protein: 7.6 g/dL (ref 6.5–8.1)

## 2019-06-18 LAB — LIPASE, BLOOD: Lipase: 23 U/L (ref 11–51)

## 2019-06-18 LAB — CBC
HCT: 37.3 % (ref 36.0–46.0)
Hemoglobin: 12 g/dL (ref 12.0–15.0)
MCH: 26.5 pg (ref 26.0–34.0)
MCHC: 32.2 g/dL (ref 30.0–36.0)
MCV: 82.5 fL (ref 80.0–100.0)
Platelets: 470 10*3/uL — ABNORMAL HIGH (ref 150–400)
RBC: 4.52 MIL/uL (ref 3.87–5.11)
RDW: 13.7 % (ref 11.5–15.5)
WBC: 8.9 10*3/uL (ref 4.0–10.5)
nRBC: 0 % (ref 0.0–0.2)

## 2019-06-18 LAB — GLUCOSE, CAPILLARY
Glucose-Capillary: 212 mg/dL — ABNORMAL HIGH (ref 70–99)
Glucose-Capillary: 223 mg/dL — ABNORMAL HIGH (ref 70–99)
Glucose-Capillary: 209 mg/dL — ABNORMAL HIGH (ref 70–99)
Glucose-Capillary: 181 mg/dL — ABNORMAL HIGH (ref 70–99)

## 2019-06-18 LAB — URINE DRUG SCREEN, QUALITATIVE (ARMC ONLY)
Amphetamines, Ur Screen: NOT DETECTED
Barbiturates, Ur Screen: NOT DETECTED
Benzodiazepine, Ur Scrn: NOT DETECTED
Cannabinoid 50 Ng, Ur ~~LOC~~: POSITIVE — AB
Cocaine Metabolite,Ur ~~LOC~~: NOT DETECTED
MDMA (Ecstasy)Ur Screen: NOT DETECTED
Methadone Scn, Ur: NOT DETECTED
Opiate, Ur Screen: POSITIVE — AB
Phencyclidine (PCP) Ur S: NOT DETECTED
Tricyclic, Ur Screen: NOT DETECTED

## 2019-06-18 LAB — SARS CORONAVIRUS 2 BY RT PCR (HOSPITAL ORDER, PERFORMED IN ~~LOC~~ HOSPITAL LAB): SARS Coronavirus 2: NEGATIVE

## 2019-06-18 LAB — POCT PREGNANCY, URINE: Preg Test, Ur: NEGATIVE

## 2019-06-18 MED ORDER — POLYETHYLENE GLYCOL 3350 17 G PO PACK: 17.0000 g | PACK | Freq: Every day | ORAL | Status: DC | PRN

## 2019-06-18 MED ORDER — MIRTAZAPINE 15 MG PO TABS
60.0000 mg | ORAL_TABLET | Freq: Every day | ORAL | Status: DC
  Administered 2019-06-18 – 2019-06-21 (×4): 60 mg via ORAL
  Filled 2019-06-18 (×4): qty 4

## 2019-06-18 MED ORDER — MORPHINE SULFATE (PF) 4 MG/ML IV SOLN
4.0000 mg | Freq: Once | INTRAVENOUS | Status: AC
  Administered 2019-06-18: 4 mg via INTRAVENOUS
  Filled 2019-06-18: qty 1

## 2019-06-18 MED ORDER — DROPERIDOL 2.5 MG/ML IJ SOLN
1.2500 mg | Freq: Once | INTRAMUSCULAR | Status: AC
  Administered 2019-06-18: 1.25 mg via INTRAVENOUS
  Filled 2019-06-18: qty 2

## 2019-06-18 MED ORDER — ONDANSETRON HCL 4 MG/2ML IJ SOLN
4.0000 mg | Freq: Four times a day (QID) | INTRAMUSCULAR | Status: DC | PRN
  Administered 2019-06-18 – 2019-06-20 (×5): 4 mg via INTRAVENOUS
  Filled 2019-06-18 (×5): qty 2

## 2019-06-18 MED ORDER — INSULIN ASPART 100 UNIT/ML ~~LOC~~ SOLN: 0.0000 [IU] | Freq: Every day | SUBCUTANEOUS | Status: DC

## 2019-06-18 MED ORDER — SODIUM CHLORIDE 0.9 % IV SOLN
1000.0000 mL | Freq: Once | INTRAVENOUS | Status: AC
  Administered 2019-06-18: 1000 mL via INTRAVENOUS

## 2019-06-18 MED ORDER — IRBESARTAN 150 MG PO TABS
75.0000 mg | ORAL_TABLET | Freq: Every day | ORAL | Status: DC
  Administered 2019-06-18 – 2019-06-22 (×4): 75 mg via ORAL
  Filled 2019-06-18 (×5): qty 1

## 2019-06-18 MED ORDER — INSULIN ASPART 100 UNIT/ML ~~LOC~~ SOLN
0.0000 [IU] | Freq: Three times a day (TID) | SUBCUTANEOUS | Status: DC
  Administered 2019-06-19 (×3): 2 [IU] via SUBCUTANEOUS
  Administered 2019-06-21: 1 [IU] via SUBCUTANEOUS
  Filled 2019-06-18 (×5): qty 1

## 2019-06-18 MED ORDER — METOPROLOL TARTRATE 50 MG PO TABS
100.0000 mg | ORAL_TABLET | Freq: Two times a day (BID) | ORAL | Status: DC
  Administered 2019-06-18 – 2019-06-22 (×8): 100 mg via ORAL
  Filled 2019-06-18 (×8): qty 2

## 2019-06-18 MED ORDER — LABETALOL HCL 5 MG/ML IV SOLN
10.0000 mg | INTRAVENOUS | Status: DC | PRN
  Administered 2019-06-18: 15:00:00 10 mg via INTRAVENOUS
  Filled 2019-06-18: qty 4

## 2019-06-18 MED ORDER — SODIUM CHLORIDE 0.9 % IV SOLN
INTRAVENOUS | Status: DC
  Administered 2019-06-18 – 2019-06-22 (×5): via INTRAVENOUS

## 2019-06-18 MED ORDER — NITROGLYCERIN 0.4 MG SL SUBL: 0.4000 mg | SUBLINGUAL_TABLET | SUBLINGUAL | Status: DC | PRN

## 2019-06-18 MED ORDER — ONDANSETRON HCL 4 MG PO TABS: 4.0000 mg | ORAL_TABLET | Freq: Four times a day (QID) | ORAL | Status: DC | PRN

## 2019-06-18 MED ORDER — CLOPIDOGREL BISULFATE 75 MG PO TABS
75.0000 mg | ORAL_TABLET | Freq: Every day | ORAL | Status: DC
  Administered 2019-06-18 – 2019-06-19 (×2): 75 mg via ORAL
  Filled 2019-06-18 (×2): qty 1

## 2019-06-18 MED ORDER — ISOSORBIDE MONONITRATE ER 60 MG PO TB24
60.0000 mg | ORAL_TABLET | Freq: Every day | ORAL | Status: DC
  Administered 2019-06-18 – 2019-06-22 (×4): 60 mg via ORAL
  Filled 2019-06-18 (×4): qty 1

## 2019-06-18 MED ORDER — INSULIN GLARGINE 100 UNIT/ML ~~LOC~~ SOLN
30.0000 [IU] | Freq: Every day | SUBCUTANEOUS | Status: DC
  Administered 2019-06-18 – 2019-06-19 (×2): 30 [IU] via SUBCUTANEOUS
  Filled 2019-06-18 (×3): qty 0.3

## 2019-06-18 MED ORDER — METOCLOPRAMIDE HCL 5 MG/ML IJ SOLN
5.0000 mg | Freq: Four times a day (QID) | INTRAMUSCULAR | Status: DC
  Administered 2019-06-18 – 2019-06-22 (×16): 5 mg via INTRAVENOUS
  Filled 2019-06-18 (×16): qty 2

## 2019-06-18 MED ORDER — LORAZEPAM 2 MG/ML IJ SOLN
0.5000 mg | Freq: Once | INTRAMUSCULAR | Status: AC
  Administered 2019-06-18: 0.5 mg via INTRAVENOUS
  Filled 2019-06-18 (×2): qty 1

## 2019-06-18 MED ORDER — VITAMIN D 25 MCG (1000 UNIT) PO TABS
2000.0000 [IU] | ORAL_TABLET | Freq: Every day | ORAL | Status: DC
  Administered 2019-06-18 – 2019-06-22 (×4): 2000 [IU] via ORAL
  Filled 2019-06-18 (×4): qty 2

## 2019-06-18 MED ORDER — OXYCODONE HCL 5 MG PO TABS
5.0000 mg | ORAL_TABLET | ORAL | Status: DC | PRN
  Administered 2019-06-18 – 2019-06-21 (×3): 5 mg via ORAL
  Filled 2019-06-18 (×3): qty 1

## 2019-06-18 MED ORDER — ENOXAPARIN SODIUM 40 MG/0.4ML ~~LOC~~ SOLN
40.0000 mg | Freq: Two times a day (BID) | SUBCUTANEOUS | Status: DC
  Administered 2019-06-19 – 2019-06-22 (×7): 40 mg via SUBCUTANEOUS
  Filled 2019-06-18 (×6): qty 0.4

## 2019-06-18 MED ORDER — ONDANSETRON HCL 4 MG/2ML IJ SOLN
4.0000 mg | Freq: Once | INTRAMUSCULAR | Status: AC | PRN
  Administered 2019-06-18: 08:00:00 4 mg via INTRAVENOUS

## 2019-06-18 MED ORDER — ONDANSETRON HCL 4 MG/2ML IJ SOLN
INTRAMUSCULAR | Status: AC
  Administered 2019-06-18: 4 mg via INTRAVENOUS
  Filled 2019-06-18: qty 2

## 2019-06-18 MED ORDER — ACETAMINOPHEN 650 MG RE SUPP: 650.0000 mg | Freq: Four times a day (QID) | RECTAL | Status: DC | PRN

## 2019-06-18 MED ORDER — MECLIZINE HCL 25 MG PO TABS
25.0000 mg | ORAL_TABLET | Freq: Three times a day (TID) | ORAL | Status: DC | PRN
  Filled 2019-06-18: qty 1

## 2019-06-18 MED ORDER — PANTOPRAZOLE SODIUM 40 MG PO TBEC
40.0000 mg | DELAYED_RELEASE_TABLET | Freq: Two times a day (BID) | ORAL | Status: DC
  Administered 2019-06-18 – 2019-06-22 (×8): 40 mg via ORAL
  Filled 2019-06-18 (×8): qty 1

## 2019-06-18 MED ORDER — MORPHINE SULFATE (PF) 2 MG/ML IV SOLN
2.0000 mg | INTRAVENOUS | Status: DC | PRN
  Administered 2019-06-18 – 2019-06-21 (×10): 2 mg via INTRAVENOUS
  Filled 2019-06-18 (×10): qty 1

## 2019-06-18 MED ORDER — ACETAMINOPHEN 325 MG PO TABS: 650.0000 mg | ORAL_TABLET | Freq: Four times a day (QID) | ORAL | Status: DC | PRN

## 2019-06-18 MED ORDER — AMITRIPTYLINE HCL 50 MG PO TABS
25.0000 mg | ORAL_TABLET | Freq: Every day | ORAL | Status: DC
  Administered 2019-06-18 – 2019-06-21 (×4): 25 mg via ORAL
  Filled 2019-06-18: qty 0.5
  Filled 2019-06-18: qty 1
  Filled 2019-06-18 (×3): qty 0.5
  Filled 2019-06-18 (×2): qty 1
  Filled 2019-06-18: qty 0.5
  Filled 2019-06-18: qty 1

## 2019-06-18 MED ORDER — DULOXETINE HCL 60 MG PO CPEP
60.0000 mg | ORAL_CAPSULE | Freq: Every day | ORAL | Status: DC
  Administered 2019-06-18 – 2019-06-22 (×4): 60 mg via ORAL
  Filled 2019-06-18 (×2): qty 1
  Filled 2019-06-18: qty 2
  Filled 2019-06-18 (×3): qty 1
  Filled 2019-06-18: qty 2

## 2019-06-18 MED ORDER — PROMETHAZINE HCL 25 MG/ML IJ SOLN
25.0000 mg | Freq: Once | INTRAMUSCULAR | Status: AC
  Administered 2019-06-18: 25 mg via INTRAVENOUS
  Filled 2019-06-18: qty 1

## 2019-06-18 MED ORDER — ENOXAPARIN SODIUM 40 MG/0.4ML ~~LOC~~ SOLN
40.0000 mg | SUBCUTANEOUS | Status: DC
  Administered 2019-06-18: 40 mg via SUBCUTANEOUS
  Filled 2019-06-18: qty 0.4

## 2019-06-18 NOTE — H&P (Signed)
[facility] [place] NAME: [name] [name]    MR#:  [id number]  DATE OF BIRTH:  [birth year]  DATE OF ADMISSION:  06/18/2019  PRIMARY CARE PHYSICIAN: Venita Lick, NP   REQUESTING/REFERRING PHYSICIAN: Dr Lavonia Drafts  CHIEF COMPLAINT:   Chief Complaint  Patient presents with  . Abdominal Pain    HISTORY OF PRESENT ILLNESS:  [name] [name]  is a 38 y.o. female with a known history of diabetes presents with abdominal pain.  She says she woke up with severe abdominal pain 8 out of 1070 this morning.  It started around 1 AM.  Still having abdominal pain.  Nausea vomiting too many times to count.  Some blood in the vomit.  No diarrhea.  Patient does have a history of diabetes and smoking marijuana.  This has happened before for her.  PAST MEDICAL HISTORY:   Past Medical History:  Diagnosis Date  . CAD (coronary artery disease)    a. 03/2015 NSTEMI/PCI: LCX 163m (DES), OM1 100p (DES - vessel labeled Ramus in subsequent cath report). Minor irregs to LAD/RCA; b. 04/2015 Cath: LM nl, LAD 40p, RI patent stent, LCX patent stent, RCA nl, EF 55-65%; c. 02/2016 MV: basal inferolateral and mid inferolateral defect/infarct w/o ischemia. EF 55-65%; d. 11/2018 PCI: LM 30ost, LAD 30p, LCX 95p/m (PTCA->20%), OM1 10ost, OM2 40ost, RCA 85p.  Marland Kitchen Chronic abdominal pain   . Chronic diastolic (congestive) heart failure ([place])    a. 03/2015 Echo: EF 30-35%, mild concentric LVH, severe HK of inf, inflat, & lat walls, mod MR, mild TR;  b. 04/2015 LV Gram: EF 55-65%; c. 09/2017 Echo: EF 55-60%, no rwma. Mild MR; d. 11/2018 Echo: Ef 60-65%, no rwma.  . CKD (chronic kidney disease), stage III ([place])   . Diabetes type 2, controlled ([place])    a. since 2002   . Diabetic gastroparesis ([facility])    a. chronic nausea/vomiting.  . Diverticulosis, sigmoid 07/2016  . Heavy menses    a. H/O IUD - expired in 2014 - remains in place.  . Hyperlipidemia   . Hypertension   . Iron  deficiency anemia   . Ischemic cardiomyopathy (resolved)    a. 03/2015 EF 30-35% post NSTEMI;  b. 04/2015 EF 55-65% on LV gram; c. 09/2017 Echo: EF 55-60%; d. 11/2018 Echo: Ef 60-65%.  . Obesity   . Tobacco abuse    a. quit 03/2015.  Marland Kitchen Uterine fibroid   . Vertigo   . Vitamin D deficiency     PAST SURGICAL HISTORY:   Past Surgical History:  Procedure Laterality Date  . APPENDECTOMY    . CARDIAC CATHETERIZATION  4/16   x2 stent [facility]  . CARDIAC CATHETERIZATION N/A 05/05/2015   Procedure: Left Heart Cath and Coronary Angiography;  Surgeon: Peter M Martinique, MD;  Location: [facility];  Service: Cardiovascular;  Laterality: N/A;  . COLONOSCOPY WITH PROPOFOL N/A 07/31/2016   Procedure: COLONOSCOPY WITH PROPOFOL;  Surgeon: Lollie Sails, MD;  Location: [facility] ENDOSCOPY;  Service: Endoscopy;  Laterality: N/A;  . CORONARY BALLOON ANGIOPLASTY N/A 12/04/2018   Procedure: CORONARY BALLOON ANGIOPLASTY;  Surgeon: Wellington Hampshire, MD;  Location: [place] [facility];  Service: Cardiovascular;  Laterality: N/A;  . CORONARY STENT INTERVENTION N/A 01/12/2019   Procedure: CORONARY STENT INTERVENTION;  Surgeon: [name], MD;  Location: [place] [facility];  Service: Cardiovascular;  Laterality: N/A;  . ESOPHAGOGASTRODUODENOSCOPY (EGD) WITH PROPOFOL N/A 07/31/2016   Procedure: ESOPHAGOGASTRODUODENOSCOPY (EGD) WITH  PROPOFOL;  Surgeon: Lollie Sails, MD;  Location: [facility] ENDOSCOPY;  Service: Endoscopy;  Laterality: N/A;  . INTRAVASCULAR PRESSURE WIRE/FFR STUDY N/A 12/04/2018   Procedure: INTRAVASCULAR PRESSURE WIRE/FFR STUDY;  Surgeon: Wellington Hampshire, MD;  Location: [place] [facility];  Service: Cardiovascular;  Laterality: N/A;  . LAPAROSCOPIC APPENDECTOMY N/A 08/07/2016   Procedure: APPENDECTOMY LAPAROSCOPIC;  Surgeon: Hubbard Robinson, MD;  Location: [facility] ORS;  Service: General;  Laterality: N/A;  . LEFT HEART CATH AND CORONARY ANGIOGRAPHY Left 12/04/2018   Procedure: LEFT HEART CATH  AND CORONARY ANGIOGRAPHY;  Surgeon: Wellington Hampshire, MD;  Location: [place] [facility];  Service: Cardiovascular;  Laterality: Left;  . LEFT HEART CATH AND CORONARY ANGIOGRAPHY N/A 01/12/2019   Procedure: LEFT HEART CATH AND CORONARY ANGIOGRAPHY;  Surgeon: [name], MD;  Location: [place] [facility];  Service: Cardiovascular;  Laterality: N/A;  . MOUTH SURGERY      SOCIAL HISTORY:   Social History   Tobacco Use  . Smoking status: Former Smoker    Years: 15.00    Quit date: 03/10/2015    Years since quitting: 4.2  . Smokeless tobacco: Never Used  Substance Use Topics  . Alcohol use: No    Alcohol/week: 0.0 standard drinks    FAMILY HISTORY:   Family History  Problem Relation Age of Onset  . Diabetes Father   . Cancer Maternal Grandmother        lung  . Cancer Maternal Grandfather        prostate  . Diabetes Paternal Grandfather   . Diabetes Paternal Grandmother   . Cancer - Cervical Maternal Aunt     DRUG ALLERGIES:   Allergies  Allergen Reactions  . Lisinopril Anaphylaxis, Itching and Swelling  . Rosemary Oil Anaphylaxis  . Shellfish Allergy Itching and Swelling  . Other Itching and Swelling    [place] seasoning  . Metformin And Related Diarrhea, Nausea And Vomiting and Rash    REVIEW OF SYSTEMS:  CONSTITUTIONAL: No fever, positive for chills and sweats.  Positive for weakness.  EYES: No blurred or double vision.  EARS, NOSE, AND THROAT: No tinnitus or ear pain. No sore throat.  Positive runny nose RESPIRATORY: No cough, shortness of breath, wheezing or hemoptysis.  CARDIOVASCULAR: No chest pain, orthopnea, edema.  GASTROINTESTINAL: Positive for nausea, vomiting, and abdominal pain. No blood in bowel movements.  No diarrhea gENITOURINARY: No dysuria, hematuria.  ENDOCRINE: No polyuria, nocturia,  HEMATOLOGY: No anemia, easy bruising or bleeding SKIN: No rash or lesion. MUSCULOSKELETAL: No joint pain or arthritis.   NEUROLOGIC: No tingling, numbness,  weakness.  PSYCHIATRY: No anxiety or depression.   MEDICATIONS AT HOME:   Prior to Admission medications   Medication Sig Start Date End Date Taking? Authorizing Provider  amitriptyline (ELAVIL) 25 MG tablet Take 25 mg by mouth at bedtime. 04/18/19  Yes [provider]  aspirin EC 81 MG tablet Take 81 mg by mouth daily.   Yes [provider]  Cholecalciferol 2000 units TABS Take 2,000 Units by mouth daily.    Yes [provider]  clopidogrel (PLAVIX) 75 MG tablet Take 4 tabs at 7pm tonight (12/28), then 1 tab daily beginning 12/29. Patient taking differently: Take 75 mg by mouth daily.  12/05/18  Yes Theora Gianotti, NP  DULoxetine (CYMBALTA) 30 MG capsule Take 60 mg by mouth daily.  07/01/18  Yes [provider]  etonogestrel (NEXPLANON) 68 MG IMPL implant 1 each by Subdermal route once.  Yes [provider]  FARXIGA 10 MG TABS tablet Take 10 mg by mouth daily.  09/08/18  Yes [provider]  furosemide (LASIX) 40 MG tablet Take 1 tablet (40 mg total) by mouth every other day. 01/19/19 07/18/19 Yes End, Harrell Gave, MD  glimepiride (AMARYL) 4 MG tablet TAKE 1 TABLET BY MOUTH ONCE DAILY Patient taking differently: Take 4 mg by mouth daily.  10/21/18  Yes Cannady, Jolene T, NP  insulin aspart (NOVOLOG) 100 UNIT/ML injection Inject 10 Units into the skin 3 (three) times daily with meals. 05/04/18  Yes Kitzia Camus, MD  irbesartan (AVAPRO) 75 MG tablet Take 1 tablet (75 mg total) by mouth daily. 04/29/19  Yes Wellington Hampshire, MD  isosorbide mononitrate (IMDUR) 30 MG 24 hr tablet Take 2 tablets (60 mg total) by mouth daily. 12/17/18  Yes Dunn, Areta Haber, PA-C  loperamide (IMODIUM A-D) 2 MG tablet Take 8-12 mg by mouth daily as needed for diarrhea or loose stools.   Yes [provider]  meclizine (ANTIVERT) 25 MG tablet Take 25 mg by mouth 3 (three) times daily as needed for dizziness.   Yes [provider]  metoCLOPramide  (REGLAN) 10 MG tablet Take 1 tablet (10 mg total) by mouth 3 (three) times daily with meals for 10 days. 04/17/19 06/18/19 Yes Menshew, Dannielle Karvonen, PA-C  metoprolol tartrate (LOPRESSOR) 100 MG tablet Take 1 tablet (100 mg total) by mouth 2 (two) times daily. 04/29/19  Yes Wellington Hampshire, MD  mirtazapine (REMERON) 30 MG tablet Take 60 mg by mouth daily.  08/12/18  Yes [provider]  nitroGLYCERIN (NITROSTAT) 0.4 MG SL tablet Take 1 tab under your tongue while sitting for chest pain, If no relief may repeat, one tab every 5 min up to 3 tabs total over 15 mins. 06/09/18  Yes Theora Gianotti, NP  ondansetron (ZOFRAN) 4 MG tablet Take 1 tablet (4 mg total) by mouth every 8 (eight) hours as needed for nausea or vomiting. 04/16/19  Yes Schuyler Amor, MD  pantoprazole (PROTONIX) 40 MG tablet TAKE 1 TABLET BY MOUTH ONCE DAILY AT 6AM Patient taking differently: Take 40 mg by mouth 2 (two) times daily.  10/21/18  Yes Cannady, Jolene T, NP  polyethylene glycol (MIRALAX / GLYCOLAX) 17 g packet Take 17 g by mouth daily as needed.  04/18/19  Yes [provider]  insulin glargine (LANTUS) 100 UNIT/ML injection Inject 60 Units into the skin daily.    [provider]      VITAL SIGNS:  Blood pressure (!) 185/122, pulse (!) 126, temperature 98.9 F (37.2 C), temperature source Oral, resp. rate 18, height 5\' 5"  (1.651 m), weight 122.5 kg, SpO2 98 %.  PHYSICAL EXAMINATION:  GENERAL:  38 y.o.-year-old patient lying in the bed with no acute distress.  EYES: Pupils equal, round, reactive to light and accommodation. No scleral icterus. Extraocular muscles intact.  HEENT: Head atraumatic, normocephalic. Oropharynx and nasopharynx clear.  NECK:  Supple, no jugular venous distention. No thyroid enlargement, no tenderness.  LUNGS: Normal breath sounds bilaterally, no wheezing, rales,rhonchi or crepitation. No use of accessory muscles of respiration.  CARDIOVASCULAR: S1, S2 normal. No  murmurs, rubs, or gallops.  ABDOMEN: Soft, positive generalized abdominal tenderness, nondistended. Bowel sounds present. No organomegaly or mass.  EXTREMITIES: Trace pedal edema, no cyanosis, or clubbing.  NEUROLOGIC: Cranial nerves II through XII are intact. Muscle strength 5/5 in all extremities. Sensation intact. Gait not checked.  PSYCHIATRIC: The patient is alert  and oriented x 3.  SKIN: No rash, lesion, or ulcer.   LABORATORY PANEL:   CBC Recent Labs  Lab 06/18/19 0740  WBC 8.9  HGB 12.0  HCT 37.3  PLT 470*   ------------------------------------------------------------------------------------------------------------------  Chemistries  Recent Labs  Lab 06/18/19 0740  NA 141  K 3.8  CL 107  CO2 21*  GLUCOSE 260*  BUN 19  CREATININE 1.42*  CALCIUM 9.4  AST 20  ALT 17  ALKPHOS 101  BILITOT 0.5   ------------------------------------------------------------------------------------------------------------------    RADIOLOGY:  No results found.  EKG:   Sinus tachycardia 106 bpm, left atrial enlargement  IMPRESSION AND PLAN:   1.  Abdominal pain with nausea vomiting.  Patient does smoke marijuana so this could be a cannabis hyperemesis syndrome.  She was advised to stop smoking marijuana.  Patient also has diabetes so this could be diabetic gastroparesis.  Will give IV Reglan PRN IV Zofran.  Pain medications with morphine and oxycodone.  Get a right upper quadrant sonogram to look at the gallbladder. 2.  Accelerated hypertension try oral medications first. 3.  History of CAD on aspirin and Plavix and beta-blocker 4.  Type 2 diabetes mellitus.  Hold oral medications and put on lower dose Lantus.  Sliding scale insulin. 5.  Morbid obesity with BMI of 44.93.  Weight loss needed 6.  Chronic kidney disease stage III. 7.  History of diastolic congestive heart failure.  No signs of heart failure at this time.  Gentle IV fluids with patient's nausea vomiting.   All  the records are reviewed and case discussed with ED provider. Management plans discussed with the patient, and she is in agreement.  Deferred me calling family at this time  CODE STATUS: Full code  TOTAL TIME TAKING CARE OF THIS PATIENT: 45 minutes.    Loletha Grayer M.D on 06/18/2019 at 12:10 PM  Between 7am to 6pm - Pager - [phone]  After 6pm call admission pager [phone]  [facility]  [phone]  CC: Primary care physician; Venita Lick, NP

## 2019-06-18 NOTE — Progress Notes (Signed)
PHARMACIST - PHYSICIAN COMMUNICATION  CONCERNING:  Enoxaparin (Lovenox) for DVT Prophylaxis    RECOMMENDATION: Patient was prescribed enoxaprin 40mg  q24 hours for VTE prophylaxis.   Filed Weights   06/18/19 0730 06/18/19 1445  Weight: 270 lb (122.5 kg) 260 lb 9.6 oz (118.2 kg)    Body mass index is 43.37 kg/m.  Estimated Creatinine Clearance: 68.4 mL/min (A) (by C-G formula based on SCr of 1.42 mg/dL (H)).   Based on [place] patient is candidate for enoxaparin 40mg  every 12 hour dosing due to BMI being >40.   DESCRIPTION: Pharmacy has adjusted enoxaparin dose per [facility] policy.  Patient is now receiving enoxaparin 40mg  every 12 hours.    Rowland Lathe, PharmD Clinical Pharmacist  06/18/2019 4:07 PM

## 2019-06-18 NOTE — ED Notes (Signed)
Pt to US with tech

## 2019-06-18 NOTE — ED Notes (Signed)
This RN called and updated pts mother per request - Nicky Pugh [phone])

## 2019-06-18 NOTE — ED Provider Notes (Signed)
[facility] Emergency Department Provider Note   ____________________________________________    I have reviewed the triage vital signs and the nursing notes.   HISTORY  Chief Complaint Abdominal Pain     HPI Marrion Finan is a 38 y.o. female with significant past medical history as detailed below including diabetes, diabetic gastroparesis, chronic abdominal pain who presents today with upper abdominal pain, nausea and vomiting which is consistent with prior episodes.  She states that nothing seems to work once it starts.  Denies fevers or chills.  Describes cramping moderate abdominal pain with repeated nausea and vomiting, no diarrhea.  No sick contacts, no travel.   Past Medical History:  Diagnosis Date  . CAD (coronary artery disease)    a. 03/2015 NSTEMI/PCI: LCX 189m (DES), OM1 100p (DES - vessel labeled Ramus in subsequent cath report). Minor irregs to LAD/RCA; b. 04/2015 Cath: LM nl, LAD 40p, RI patent stent, LCX patent stent, RCA nl, EF 55-65%; c. 02/2016 MV: basal inferolateral and mid inferolateral defect/infarct w/o ischemia. EF 55-65%; d. 11/2018 PCI: LM 30ost, LAD 30p, LCX 95p/m (PTCA->20%), OM1 10ost, OM2 40ost, RCA 85p.  Marland Kitchen Chronic abdominal pain   . Chronic diastolic (congestive) heart failure ([place])    a. 03/2015 Echo: EF 30-35%, mild concentric LVH, severe HK of inf, inflat, & lat walls, mod MR, mild TR;  b. 04/2015 LV Gram: EF 55-65%; c. 09/2017 Echo: EF 55-60%, no rwma. Mild MR; d. 11/2018 Echo: Ef 60-65%, no rwma.  . CKD (chronic kidney disease), stage III ([place])   . Diabetes type 2, controlled ([place])    a. since 2002   . Diabetic gastroparesis (HCC)    a. chronic nausea/vomiting.  . Diverticulosis, sigmoid 07/2016  . Heavy menses    a. H/O IUD - expired in 2014 - remains in place.  . Hyperlipidemia   . Hypertension   . Iron deficiency anemia   . Ischemic cardiomyopathy (resolved)    a. 03/2015 EF 30-35% post NSTEMI;  b. 04/2015 EF  55-65% on LV gram; c. 09/2017 Echo: EF 55-60%; d. 11/2018 Echo: Ef 60-65%.  . Obesity   . Tobacco abuse    a. quit 03/2015.  Marland Kitchen Uterine fibroid   . Vertigo   . Vitamin D deficiency     Patient Active Problem List   Diagnosis Date Noted  . Diabetic gastroparesis ([place]) 06/18/2019  . Anemia 01/14/2019  . Stable angina ([place]) 01/12/2019  . Thrombocytosis ([place]) 01/05/2019  . Coronary artery disease of native artery of native heart with stable angina pectoris ([place]) 12/17/2018  . Unstable angina ([place]) 12/04/2018  . Diabetic gastroparesis associated with type 2 diabetes mellitus ([place]) 05/02/2018  . CKD stage 3 due to type 2 diabetes mellitus ([place]) 04/06/2018  . Poorly controlled type 2 diabetes mellitus ([place]) 08/27/2017  . Lung nodule < 6cm on CT 08/12/2017  . Depression, major, single episode, severe ([place]) 08/12/2017  . Chronic abdominal pain 08/03/2017  . Obstructive sleep apnea 05/16/2017  . Neuropathy 01/17/2017  . Vitamin D deficiency 10/25/2016  . Insomnia 10/25/2016  . 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 Elevated liver function tests 06/05/2016  . Chronic diastolic heart failure ([place]) 03/19/2016  . Proteinuria 08/30/2015  . CAD in native artery   . Angina pectoris ([place])   . Iron deficiency anemia   . Hyperlipidemia associated with type 2 diabetes mellitus ([place]) 04/13/2015  . Ischemic cardiomyopathy   . Obesity  Past Surgical History:  Procedure Laterality Date  . APPENDECTOMY    . CARDIAC CATHETERIZATION  4/16   x2 stent [facility]  . CARDIAC CATHETERIZATION N/A 05/05/2015   Procedure: Left Heart Cath and Coronary Angiography;  Surgeon: Peter M Martinique, MD;  Location: [place] [facility];  Service: Cardiovascular;  Laterality: N/A;  . COLONOSCOPY WITH PROPOFOL N/A 07/31/2016   Procedure: COLONOSCOPY WITH PROPOFOL;  Surgeon: Lollie Sails, MD;  Location: [facility] ENDOSCOPY;  Service: Endoscopy;  Laterality: N/A;  . CORONARY  BALLOON ANGIOPLASTY N/A 12/04/2018   Procedure: CORONARY BALLOON ANGIOPLASTY;  Surgeon: Wellington Hampshire, MD;  Location: [place] [facility];  Service: Cardiovascular;  Laterality: N/A;  . CORONARY STENT INTERVENTION N/A 01/12/2019   Procedure: CORONARY STENT INTERVENTION;  Surgeon: [name], MD;  Location: [place] [facility];  Service: Cardiovascular;  Laterality: N/A;  . ESOPHAGOGASTRODUODENOSCOPY (EGD) WITH PROPOFOL N/A 07/31/2016   Procedure: ESOPHAGOGASTRODUODENOSCOPY (EGD) WITH PROPOFOL;  Surgeon: Lollie Sails, MD;  Location: [facility] ENDOSCOPY;  Service: Endoscopy;  Laterality: N/A;  . INTRAVASCULAR PRESSURE WIRE/FFR STUDY N/A 12/04/2018   Procedure: INTRAVASCULAR PRESSURE WIRE/FFR STUDY;  Surgeon: Wellington Hampshire, MD;  Location: [place] [facility];  Service: Cardiovascular;  Laterality: N/A;  . LAPAROSCOPIC APPENDECTOMY N/A 08/07/2016   Procedure: APPENDECTOMY LAPAROSCOPIC;  Surgeon: Hubbard Robinson, MD;  Location: [facility] ORS;  Service: General;  Laterality: N/A;  . LEFT HEART CATH AND CORONARY ANGIOGRAPHY Left 12/04/2018   Procedure: LEFT HEART CATH AND CORONARY ANGIOGRAPHY;  Surgeon: Wellington Hampshire, MD;  Location: [place] [facility];  Service: Cardiovascular;  Laterality: Left;  . LEFT HEART CATH AND CORONARY ANGIOGRAPHY N/A 01/12/2019   Procedure: LEFT HEART CATH AND CORONARY ANGIOGRAPHY;  Surgeon: [name], MD;  Location: [place] [facility];  Service: Cardiovascular;  Laterality: N/A;  . MOUTH SURGERY      Prior to Admission medications   Medication Sig Start Date End Date Taking? Authorizing Provider  amitriptyline (ELAVIL) 25 MG tablet Take 25 mg by mouth at bedtime. 04/18/19  Yes [provider]  aspirin EC 81 MG tablet Take 81 mg by mouth daily.   Yes [provider]  Cholecalciferol 2000 units TABS Take 2,000 Units by mouth daily.    Yes [provider]  clopidogrel (PLAVIX) 75 MG tablet Take 4 tabs at 7pm tonight (12/28),  then 1 tab daily beginning 12/29. Patient taking differently: Take 75 mg by mouth daily.  12/05/18  Yes Theora Gianotti, NP  DULoxetine (CYMBALTA) 30 MG capsule Take 60 mg by mouth daily.  07/01/18  Yes [provider]  etonogestrel (NEXPLANON) 68 MG IMPL implant 1 each by Subdermal route once.   Yes [provider]  FARXIGA 10 MG TABS tablet Take 10 mg by mouth daily.  09/08/18  Yes [provider]  furosemide (LASIX) 40 MG tablet Take 1 tablet (40 mg total) by mouth every other day. 01/19/19 07/18/19 Yes End, Harrell Gave, MD  glimepiride (AMARYL) 4 MG tablet TAKE 1 TABLET BY MOUTH ONCE DAILY Patient taking differently: Take 4 mg by mouth daily.  10/21/18  Yes Cannady, Jolene T, NP  insulin aspart (NOVOLOG) 100 UNIT/ML injection Inject 10 Units into the skin 3 (three) times daily with meals. 05/04/18  Yes Wieting, Richard, MD  irbesartan (AVAPRO) 75 MG tablet Take 1 tablet (75 mg total) by mouth daily. 04/29/19  Yes Wellington Hampshire, MD  isosorbide mononitrate (IMDUR) 30 MG 24 hr tablet Take 2 tablets (60 mg total) by  mouth daily. 12/17/18  Yes Dunn, Areta Haber, PA-C  loperamide (IMODIUM A-D) 2 MG tablet Take 8-12 mg by mouth daily as needed for diarrhea or loose stools.   Yes [provider]  meclizine (ANTIVERT) 25 MG tablet Take 25 mg by mouth 3 (three) times daily as needed for dizziness.   Yes [provider]  metoCLOPramide (REGLAN) 10 MG tablet Take 1 tablet (10 mg total) by mouth 3 (three) times daily with meals for 10 days. 04/17/19 06/18/19 Yes Menshew, Dannielle Karvonen, PA-C  metoprolol tartrate (LOPRESSOR) 100 MG tablet Take 1 tablet (100 mg total) by mouth 2 (two) times daily. 04/29/19  Yes Wellington Hampshire, MD  mirtazapine (REMERON) 30 MG tablet Take 60 mg by mouth daily.  08/12/18  Yes [provider]  nitroGLYCERIN (NITROSTAT) 0.4 MG SL tablet Take 1 tab under your tongue while sitting for chest pain, If no relief may repeat, one tab every  5 min up to 3 tabs total over 15 mins. 06/09/18  Yes Theora Gianotti, NP  ondansetron (ZOFRAN) 4 MG tablet Take 1 tablet (4 mg total) by mouth every 8 (eight) hours as needed for nausea or vomiting. 04/16/19  Yes Schuyler Amor, MD  pantoprazole (PROTONIX) 40 MG tablet TAKE 1 TABLET BY MOUTH ONCE DAILY AT 6AM Patient taking differently: Take 40 mg by mouth 2 (two) times daily.  10/21/18  Yes Cannady, Jolene T, NP  polyethylene glycol (MIRALAX / GLYCOLAX) 17 g packet Take 17 g by mouth daily as needed.  04/18/19  Yes [provider]  insulin glargine (LANTUS) 100 UNIT/ML injection Inject 60 Units into the skin daily.    [provider]     Allergies Lisinopril, Rosemary oil, Shellfish allergy, Other, and Metformin and related  Family History  Problem Relation Age of Onset  . Diabetes Father   . Cancer Maternal Grandmother        lung  . Cancer Maternal Grandfather        prostate  . Diabetes Paternal Grandfather   . Diabetes Paternal Grandmother   . Cancer - Cervical Maternal Aunt     Social History Social History   Tobacco Use  . Smoking status: Former Smoker    Years: 15.00    Quit date: 03/10/2015    Years since quitting: 4.2  . Smokeless tobacco: Never Used  Substance Use Topics  . Alcohol use: No    Alcohol/week: 0.0 standard drinks  . Drug use: No    Review of Systems  Constitutional: No fever/chills Eyes: No visual changes.  ENT: Mild sore throat from vomiting Cardiovascular: Denies chest pain. Respiratory: Denies shortness of breath. Gastrointestinal: As above Genitourinary: Negative for dysuria. Musculoskeletal: Negative for back pain. Skin: Negative for rash. Neurological: Negative for headaches or weakness   ____________________________________________   PHYSICAL EXAM:  VITAL SIGNS: ED Triage Vitals  Enc Vitals Group     BP 06/18/19 0732 136/77     Pulse Rate 06/18/19 0732 (!) 103     Resp 06/18/19 0732 (!) 22     Temp  06/18/19 0732 98.9 F (37.2 C)     Temp Source 06/18/19 0732 Oral     SpO2 06/18/19 0732 95 %     Weight 06/18/19 0730 122.5 kg (270 lb)     Height 06/18/19 0730 1.651 m (5\' 5" )     Head Circumference --      Peak Flow --      Pain Score 06/18/19 0729 10  Pain Loc --      Pain Edu? --      Excl. in [place]? --     Constitutional: Alert and oriented.  Eyes: Conjunctivae are normal.   Nose: No congestion/rhinnorhea. Mouth/Throat: Mucous membranes are moist.    Cardiovascular: Mild tachycardia, regular rhythm. Grossly normal heart sounds.  Good peripheral circulation. Respiratory: Normal respiratory effort.  No retractions. Lungs CTAB. Gastrointestinal: Soft, no significant tenderness, no distention, no CVA tenderness, reassuring exam  Musculoskeletal:   Warm and well perfused Neurologic:  Normal speech and language. No gross focal neurologic deficits are appreciated.  Skin:  Skin is warm, dry and intact. No rash noted. Psychiatric: Mood and affect are normal. Speech and behavior are normal.  ____________________________________________   LABS (all labs ordered are listed, but only abnormal results are displayed)  Labs Reviewed  COMPREHENSIVE METABOLIC PANEL - Abnormal; Notable for the following components:      Result Value   CO2 21 (*)    Glucose, Bld 260 (*)    Creatinine, Ser 1.42 (*)    GFR calc non Af Amer 46 (*)    GFR calc Af Amer 54 (*)    All other components within normal limits  CBC - Abnormal; Notable for the following components:   Platelets 470 (*)    All other components within normal limits  URINALYSIS, COMPLETE (UACMP) WITH MICROSCOPIC - Abnormal; Notable for the following components:   Color, Urine STRAW (*)    APPearance CLEAR (*)    Glucose, UA >=500 (*)    Hgb urine dipstick MODERATE (*)    Ketones, ur 20 (*)    Protein, ur 100 (*)    Leukocytes,Ua TRACE (*)    All other components within normal limits  URINE DRUG SCREEN, QUALITATIVE ([facility] ONLY)  - Abnormal; Notable for the following components:   Opiate, Ur Screen POSITIVE (*)    Cannabinoid 50 Ng, Ur [place] POSITIVE (*)    All other components within normal limits  SARS CORONAVIRUS 2 (HOSPITAL ORDER, [facility])  LIPASE, BLOOD  HIV ANTIBODY (ROUTINE TESTING W REFLEX)  POC URINE PREG, ED  POCT PREGNANCY, URINE   ____________________________________________  EKG  None ____________________________________________  RADIOLOGY  None ____________________________________________   PROCEDURES  Procedure(s) performed: No  Procedures   Critical Care performed: No ____________________________________________   INITIAL IMPRESSION / ASSESSMENT AND PLAN / ED COURSE  Pertinent labs & imaging results that were available during my care of the patient were reviewed by me and considered in my medical decision making (see chart for details).  Patient presents with nausea vomiting upper abdominal cramping.  Differential includes diabetic gastroparesis, gastritis, pancreatitis.  We will treat with IV fluids, IV antiemetics, IV morphine check labs and reevaluate.    ____________________________________________   FINAL CLINICAL IMPRESSION(S) / ED DIAGNOSES  Final diagnoses:  Intractable nausea and vomiting        Note:  This document was prepared using Dragon voice recognition software and may include unintentional dictation errors.   Lavonia Drafts, MD 06/18/19 1312

## 2019-06-18 NOTE — Progress Notes (Signed)
Notify Dr. Leslye Peer about patient's BP at admission still at 210/109, asked if she can have PRN IV medication, IV labetalol PRN given. RN will continue to monitor.

## 2019-06-18 NOTE — ED Triage Notes (Signed)
Pt reports diffuse abdominal pain and vomiting since 1am. Denies diarrhea.

## 2019-06-18 NOTE — ED Notes (Addendum)
ED TO INPATIENT HANDOFF REPORT  ED Nurse Name and Phone #: Doy Taaffe, RN [phone]  S Name/Age/Gender [name] [name] 38 y.o. female Room/Bed: ED16A/ED16A  Code Status   Code Status: Full Code  Home/SNF/Other Home Patient oriented to: self, place, time and situation Is this baseline? Yes   Triage Complete: Triage complete  Chief Complaint emesis  Triage Note Pt reports diffuse abdominal pain and vomiting since 1am. Denies diarrhea.    Allergies Allergies  Allergen Reactions  . Lisinopril Anaphylaxis, Itching and Swelling  . Rosemary Oil Anaphylaxis  . Shellfish Allergy Itching and Swelling  . Other Itching and Swelling    [place] seasoning  . Metformin And Related Diarrhea, Nausea And Vomiting and Rash    Level of Care/Admitting Diagnosis ED Disposition    ED Disposition Condition [place]: [place] [[id number]]  Level of Care: Med-Surg [16]  Covid Evaluation: Person Under Investigation (PUI)  Diagnosis: Diabetic gastroparesis [facility]) [[id number]]  Admitting Physician: Loletha Grayer [[id number]]  Attending Physician: Loletha Grayer [phone]  Estimated length of stay: past midnight tomorrow  Certification:: I certify this patient will need inpatient services for at least 2 midnights  PT Class (Do Not Modify): Inpatient [101]  PT Acc Code (Do Not Modify): Private [1]       B Medical/Surgery History Past Medical History:  Diagnosis Date  . CAD (coronary artery disease)    a. 03/2015 NSTEMI/PCI: LCX 162m (DES), OM1 100p (DES - vessel labeled Ramus in subsequent cath report). Minor irregs to LAD/RCA; b. 04/2015 Cath: LM nl, LAD 40p, RI patent stent, LCX patent stent, RCA nl, EF 55-65%; c. 02/2016 MV: basal inferolateral and mid inferolateral defect/infarct w/o ischemia. EF 55-65%; d. 11/2018 PCI: LM 30ost, LAD 30p, LCX 95p/m (PTCA->20%), OM1 10ost, OM2 40ost, RCA 85p.  Marland Kitchen Chronic abdominal pain   . Chronic diastolic (congestive) heart  failure ([place])    a. 03/2015 Echo: EF 30-35%, mild concentric LVH, severe HK of inf, inflat, & lat walls, mod MR, mild TR;  b. 04/2015 LV Gram: EF 55-65%; c. 09/2017 Echo: EF 55-60%, no rwma. Mild MR; d. 11/2018 Echo: Ef 60-65%, no rwma.  . CKD (chronic kidney disease), stage III ([place])   . Diabetes type 2, controlled ([place])    a. since 2002   . Diabetic gastroparesis (H[facility])    a. chronic nausea/vomiting.  . Diverticulosis, sigmoid 07/2016  . Heavy menses    a. H/O IUD - expired in 2014 - remains in place.  . Hyperlipidemia   . Hypertension   . Iron deficiency anemia   . Ischemic cardiomyopathy (resolved)    a. 03/2015 EF 30-35% post NSTEMI;  b. 04/2015 EF 55-65% on LV gram; c. 09/2017 Echo: EF 55-60%; d. 11/2018 Echo: Ef 60-65%.  . Obesity   . Tobacco abuse    a. quit 03/2015.  Marland Kitchen Uterine fibroid   . Vertigo   . Vitamin D deficiency    Past Surgical History:  Procedure Laterality Date  . APPENDECTOMY    . CARDIAC CATHETERIZATION  4/16   x2 stent [facility]  . CARDIAC CATHETERIZATION N/A 05/05/2015   Procedure: Left Heart Cath and Coronary Angiography;  Surgeon: Peter M Martinique, MD;  Location: [place] CV LAB;  Service: Cardiovascular;  Laterality: N/A;  . COLONOSCOPY WITH PROPOFOL N/A 07/31/2016   Procedure: COLONOSCOPY WITH PROPOFOL;  Surgeon: Lollie Sails, MD;  Location: [facility] ENDOSCOPY;  Service: Endoscopy;  Laterality: N/A;  . CORONARY BALLOON ANGIOPLASTY N/A 12/04/2018  Procedure: CORONARY BALLOON ANGIOPLASTY;  Surgeon: Wellington Hampshire, MD;  Location: [place] CV LAB;  Service: Cardiovascular;  Laterality: N/A;  . CORONARY STENT INTERVENTION N/A 01/12/2019   Procedure: CORONARY STENT INTERVENTION;  Surgeon: [name], MD;  Location: [place] CV LAB;  Service: Cardiovascular;  Laterality: N/A;  . ESOPHAGOGASTRODUODENOSCOPY (EGD) WITH PROPOFOL N/A 07/31/2016   Procedure: ESOPHAGOGASTRODUODENOSCOPY (EGD) WITH PROPOFOL;  Surgeon: Lollie Sails, MD;  Location: [facility]  ENDOSCOPY;  Service: Endoscopy;  Laterality: N/A;  . INTRAVASCULAR PRESSURE WIRE/FFR STUDY N/A 12/04/2018   Procedure: INTRAVASCULAR PRESSURE WIRE/FFR STUDY;  Surgeon: Wellington Hampshire, MD;  Location: [place] CV LAB;  Service: Cardiovascular;  Laterality: N/A;  . LAPAROSCOPIC APPENDECTOMY N/A 08/07/2016   Procedure: APPENDECTOMY LAPAROSCOPIC;  Surgeon: Hubbard Robinson, MD;  Location: [facility] ORS;  Service: General;  Laterality: N/A;  . LEFT HEART CATH AND CORONARY ANGIOGRAPHY Left 12/04/2018   Procedure: LEFT HEART CATH AND CORONARY ANGIOGRAPHY;  Surgeon: Wellington Hampshire, MD;  Location: [place] CV LAB;  Service: Cardiovascular;  Laterality: Left;  . LEFT HEART CATH AND CORONARY ANGIOGRAPHY N/A 01/12/2019   Procedure: LEFT HEART CATH AND CORONARY ANGIOGRAPHY;  Surgeon: [name], MD;  Location: [place] CV LAB;  Service: Cardiovascular;  Laterality: N/A;  . MOUTH SURGERY       A IV Location/Drains/Wounds Patient Lines/Drains/Airways Status   Active Line/Drains/Airways    Name:   Placement date:   Placement time:   Site:   Days:   Peripheral IV 06/18/19 Right Antecubital   06/18/19    0739    Antecubital   less than 1          Intake/Output Last 24 hours  Intake/Output Summary (Last 24 hours) at 06/18/2019 1340 Last data filed at 06/18/2019 2440 Gross per 24 hour  Intake 1000 ml  Output -  Net 1000 ml    Labs/Imaging Results for orders placed or performed during the hospital encounter of 06/18/19 (from the past 48 hour(s))  Lipase, blood     Status: None   Collection Time: 06/18/19  7:40 AM  Result Value Ref Range   Lipase 23 11 - 51 U/L    Comment: Performed at [facility], [address] [place], [place] [address]  Comprehensive metabolic panel     Status: Abnormal   Collection Time: 06/18/19  7:40 AM  Result Value Ref Range   Sodium 141 135 - 145 mmol/L   Potassium 3.8 3.5 - 5.1 mmol/L   Chloride 107 98 - 111 mmol/L   CO2 21 (L) 22 - 32  mmol/L   Glucose, Bld 260 (H) 70 - 99 mg/dL   BUN 19 6 - 20 mg/dL   Creatinine, Ser 1.42 (H) 0.44 - 1.00 mg/dL   Calcium 9.4 8.9 - 10.3 mg/dL   Total Protein 7.6 6.5 - 8.1 g/dL   Albumin 3.5 3.5 - 5.0 g/dL   AST 20 15 - 41 U/L   ALT 17 0 - 44 U/L   Alkaline Phosphatase 101 38 - 126 U/L   Total Bilirubin 0.5 0.3 - 1.2 mg/dL   GFR calc non Af Amer 46 (L) >60 mL/min   GFR calc Af Amer 54 (L) >60 mL/min   Anion gap 13 5 - 15    Comment: Performed at [facility], [address] [place], [place] [address]  CBC     Status: Abnormal   Collection Time: 06/18/19  7:40 AM  Result Value Ref Range  WBC 8.9 4.0 - 10.5 K/uL   RBC 4.52 3.87 - 5.11 MIL/uL   Hemoglobin 12.0 12.0 - 15.0 g/dL   HCT 37.3 36.0 - 46.0 %   MCV 82.5 80.0 - 100.0 fL   MCH 26.5 26.0 - 34.0 pg   MCHC 32.2 30.0 - 36.0 g/dL   RDW 13.7 11.5 - 15.5 %   Platelets 470 (H) 150 - 400 K/uL   nRBC 0.0 0.0 - 0.2 %    Comment: Performed at [facility], [address] [place], [place] [address]  Urinalysis, Complete w Microscopic     Status: Abnormal   Collection Time: 06/18/19  9:29 AM  Result Value Ref Range   Color, Urine STRAW (A) YELLOW   APPearance CLEAR (A) CLEAR   Specific Gravity, Urine 1.013 1.005 - 1.030   pH 7.0 5.0 - 8.0   Glucose, UA >=500 (A) NEGATIVE mg/dL   Hgb urine dipstick MODERATE (A) NEGATIVE   Bilirubin Urine NEGATIVE NEGATIVE   Ketones, ur 20 (A) NEGATIVE mg/dL   Protein, ur 100 (A) NEGATIVE mg/dL   Nitrite NEGATIVE NEGATIVE   Leukocytes,Ua TRACE (A) NEGATIVE   RBC / HPF 21-50 0 - 5 RBC/hpf   WBC, UA 6-10 0 - 5 WBC/hpf   Bacteria, UA NONE SEEN NONE SEEN   Squamous Epithelial / LPF NONE SEEN 0 - 5   Mucus PRESENT     Comment: Performed at [facility], [address] [place], [place] [address]  Urine Drug Screen, Qualitative ([facility] only)     Status: Abnormal   Collection Time: 06/18/19  9:29 AM  Result Value Ref Range   Tricyclic, Ur Screen NONE DETECTED NONE  DETECTED   Amphetamines, Ur Screen NONE DETECTED NONE DETECTED   MDMA (Ecstasy)Ur Screen NONE DETECTED NONE DETECTED   Cocaine Metabolite,Ur [place] NONE DETECTED NONE DETECTED   Opiate, Ur Screen POSITIVE (A) NONE DETECTED   Phencyclidine (PCP) Ur S NONE DETECTED NONE DETECTED   Cannabinoid 50 Ng, Ur [place] POSITIVE (A) NONE DETECTED   Barbiturates, Ur Screen NONE DETECTED NONE DETECTED   Benzodiazepine, Ur Scrn NONE DETECTED NONE DETECTED   Methadone Scn, Ur NONE DETECTED NONE DETECTED    Comment: (NOTE) Tricyclics + metabolites, urine    Cutoff 1000 ng/mL Amphetamines + metabolites, urine  Cutoff 1000 ng/mL MDMA (Ecstasy), urine              Cutoff 500 ng/mL Cocaine Metabolite, urine          Cutoff 300 ng/mL Opiate + metabolites, urine        Cutoff 300 ng/mL Phencyclidine (PCP), urine         Cutoff 25 ng/mL Cannabinoid, urine                 Cutoff 50 ng/mL Barbiturates + metabolites, urine  Cutoff 200 ng/mL Benzodiazepine, urine              Cutoff 200 ng/mL Methadone, urine                   Cutoff 300 ng/mL The urine drug screen provides only a preliminary, unconfirmed analytical test result and should not be used for non-medical purposes. Clinical consideration and professional judgment should be applied to any positive drug screen result due to possible interfering substances. A more specific alternate chemical method must be used in order to obtain a confirmed analytical result. Gas chromatography / mass spectrometry (GC/MS) is the preferred confirmat ory method. Performed  at [facility], [address] [place], [place] [address]   Pregnancy, urine POC     Status: None   Collection Time: 06/18/19  9:32 AM  Result Value Ref Range   Preg Test, Ur NEGATIVE NEGATIVE    Comment:        THE SENSITIVITY OF THIS METHODOLOGY IS >24 mIU/mL   SARS Coronavirus 2 (CEPHEID- Performed in [place] hospital lab), Hosp Order     Status: None   Collection Time: 06/18/19 11:43  AM   Specimen: Nasopharyngeal Swab  Result Value Ref Range   SARS Coronavirus 2 NEGATIVE NEGATIVE    Comment: (NOTE) If result is NEGATIVE SARS-CoV-2 target nucleic acids are NOT DETECTED. The SARS-CoV-2 RNA is generally detectable in upper and lower  respiratory specimens during the acute phase of infection. The lowest  concentration of SARS-CoV-2 viral copies this assay can detect is 250  copies / mL. A negative result does not preclude SARS-CoV-2 infection  and should not be used as the sole basis for treatment or other  patient management decisions.  A negative result may occur with  improper specimen collection / handling, submission of specimen other  than nasopharyngeal swab, presence of viral mutation(s) within the  areas targeted by this assay, and inadequate number of viral copies  (<250 copies / mL). A negative result must be combined with clinical  observations, patient history, and epidemiological information. If result is POSITIVE SARS-CoV-2 target nucleic acids are DETECTED. The SARS-CoV-2 RNA is generally detectable in upper and lower  respiratory specimens dur ing the acute phase of infection.  Positive  results are indicative of active infection with SARS-CoV-2.  Clinical  correlation with patient history and other diagnostic information is  necessary to determine patient infection status.  Positive results do  not rule out bacterial infection or co-infection with other viruses. If result is PRESUMPTIVE POSTIVE SARS-CoV-2 nucleic acids MAY BE PRESENT.   A presumptive positive result was obtained on the submitted specimen  and confirmed on repeat testing.  While 2019 novel coronavirus  (SARS-CoV-2) nucleic acids may be present in the submitted sample  additional confirmatory testing may be necessary for epidemiological  and / or clinical management purposes  to differentiate between  SARS-CoV-2 and other Sarbecovirus currently known to infect humans.  If clinically  indicated additional testing with an alternate test  methodology [phone]) is advised. The SARS-CoV-2 RNA is generally  detectable in upper and lower respiratory sp ecimens during the acute  phase of infection. The expected result is Negative. Fact Sheet for Patients:  StrictlyIdeas.no Fact Sheet for Healthcare Providers: BankingDealers.co.za This test is not yet approved or cleared by the [place] FDA and has been authorized for detection and/or diagnosis of SARS-CoV-2 by FDA under an Emergency Use Authorization (EUA).  This EUA will remain in effect (meaning this test can be used) for the duration of the COVID-19 declaration under Section 564(b)(1) of the Act, 21 U.S.C. section 360bbb-3(b)(1), unless the authorization is terminated or revoked sooner. Performed at [facility], [address] [place], [place] [address]   Glucose, capillary     Status: Abnormal   Collection Time: 06/18/19  1:35 PM  Result Value Ref Range   Glucose-Capillary 212 (H) 70 - 99 mg/dL   No results found.  Pending Labs Unresulted Labs (From admission, onward)    Start     Ordered   06/25/19 0500  Creatinine, serum  (enoxaparin (LOVENOX)    CrCl >/= 30 ml/min)  Weekly,   STAT    Comments: while on enoxaparin therapy    06/18/19 1205   06/19/19 7591  Basic metabolic panel  Tomorrow morning,   STAT     06/18/19 1205   06/19/19 0500  CBC  Tomorrow morning,   STAT     06/18/19 1205   06/18/19 1206  HIV antibody (Routine Testing)  Add-on,   AD     06/18/19 1205          Vitals/Pain Today's Vitals   06/18/19 1103 06/18/19 1115 06/18/19 1130 06/18/19 1245  BP:   (!) 185/122 (!) 202/110  Pulse: (!) 112 (!) 107 (!) 126 (!) 123  Resp: 18     Temp:      TempSrc:      SpO2: 98% (!) 79% 98%   Weight:      Height:      PainSc:        Isolation Precautions Droplet and Contact precautions  Medications Medications  metoCLOPramide  (REGLAN) injection 5 mg (5 mg Intravenous Given 06/18/19 1243)  enoxaparin (LOVENOX) injection 40 mg (40 mg Subcutaneous Given 06/18/19 1250)  acetaminophen (TYLENOL) tablet 650 mg (has no administration in time range)    Or  acetaminophen (TYLENOL) suppository 650 mg (has no administration in time range)  ondansetron (ZOFRAN) tablet 4 mg (has no administration in time range)    Or  ondansetron (ZOFRAN) injection 4 mg (has no administration in time range)  insulin glargine (LANTUS) injection 30 Units (has no administration in time range)  irbesartan (AVAPRO) tablet 75 mg (has no administration in time range)  isosorbide mononitrate (IMDUR) 24 hr tablet 60 mg (60 mg Oral Given 06/18/19 1246)  metoprolol tartrate (LOPRESSOR) tablet 100 mg (100 mg Oral Given 06/18/19 1245)  nitroGLYCERIN (NITROSTAT) SL tablet 0.4 mg (has no administration in time range)  amitriptyline (ELAVIL) tablet 25 mg (has no administration in time range)  DULoxetine (CYMBALTA) DR capsule 60 mg (has no administration in time range)  mirtazapine (REMERON) tablet 60 mg (has no administration in time range)  meclizine (ANTIVERT) tablet 25 mg (has no administration in time range)  pantoprazole (PROTONIX) EC tablet 40 mg (40 mg Oral Given 06/18/19 1246)  polyethylene glycol (MIRALAX / GLYCOLAX) packet 17 g (has no administration in time range)  clopidogrel (PLAVIX) tablet 75 mg (has no administration in time range)  cholecalciferol (VITAMIN D3) tablet 2,000 Units (2,000 Units Oral Given 06/18/19 1246)  0.9 %  sodium chloride infusion ( Intravenous New Bag/Given 06/18/19 1249)  morphine 2 MG/ML injection 2 mg (has no administration in time range)  oxyCODONE (Oxy IR/ROXICODONE) immediate release tablet 5 mg (has no administration in time range)  ondansetron (ZOFRAN) injection 4 mg (4 mg Intravenous Given 06/18/19 0738)  0.9 %  sodium chloride infusion (0 mLs Intravenous Stopped 06/18/19 0929)  promethazine (PHENERGAN) injection 25 mg (25  mg Intravenous Given 06/18/19 0755)  morphine 4 MG/ML injection 4 mg (4 mg Intravenous Given 06/18/19 0808)  droperidol (INAPSINE) 2.5 MG/ML injection 1.25 mg (1.25 mg Intravenous Given 06/18/19 1057)  LORazepam (ATIVAN) injection 0.5 mg (0.5 mg Intravenous Given 06/18/19 1255)    Mobility walks Low fall risk   Focused Assessments Cardiac Assessment Handoff:  Cardiac Rhythm: Normal sinus rhythm Lab Results  Component Value Date   CKTOTAL 359 (H) 12/10/2016   CKMB 31.5 (H) 03/31/2015   TROPONINI <0.03 04/17/2019   No results found for: DDIMER Does the Patient currently have chest pain? No  R Recommendations: See Admitting Provider Note  Report given to: Aniceto Boss, RN   Additional Notes:  Pt denies any nausea at this time and she was able to keep down all oral medications with water.

## 2019-06-18 NOTE — Progress Notes (Signed)
Recheck blood sugar since its more than an hour, blood sugar was 181, bedtime coverage not needed.

## 2019-06-18 NOTE — Progress Notes (Signed)
Notify Dr. Jannifer Franklin about patient's blood sugar at 209, she does not have meal or bedtime insulin coverage, order given. RN will continue to monitor.

## 2019-06-18 NOTE — ED Notes (Signed)
Clear liquid meal tray given to pt. CBG=212 and pt agreed to take the ordered lantus.

## 2019-06-19 ENCOUNTER — Encounter: Payer: Self-pay | Admitting: Radiology

## 2019-06-19 ENCOUNTER — Inpatient Hospital Stay: Payer: Medicare Other

## 2019-06-19 LAB — CBC
HCT: 39.6 % (ref 36.0–46.0)
Hemoglobin: 12.4 g/dL (ref 12.0–15.0)
MCH: 26 pg (ref 26.0–34.0)
MCHC: 31.3 g/dL (ref 30.0–36.0)
MCV: 83 fL (ref 80.0–100.0)
Platelets: 475 10*3/uL — ABNORMAL HIGH (ref 150–400)
RBC: 4.77 MIL/uL (ref 3.87–5.11)
RDW: 13.8 % (ref 11.5–15.5)
WBC: 13.1 10*3/uL — ABNORMAL HIGH (ref 4.0–10.5)
nRBC: 0 % (ref 0.0–0.2)

## 2019-06-19 LAB — BASIC METABOLIC PANEL
Anion gap: 12 (ref 5–15)
BUN: 20 mg/dL (ref 6–20)
CO2: 23 mmol/L (ref 22–32)
Calcium: 9 mg/dL (ref 8.9–10.3)
Chloride: 106 mmol/L (ref 98–111)
Creatinine, Ser: 1.54 mg/dL — ABNORMAL HIGH (ref 0.44–1.00)
GFR calc Af Amer: 49 mL/min — ABNORMAL LOW (ref >60)
GFR calc non Af Amer: 42 mL/min — ABNORMAL LOW (ref >60)
Glucose, Bld: 160 mg/dL — ABNORMAL HIGH (ref 70–99)
Potassium: 3.9 mmol/L (ref 3.5–5.1)
Sodium: 141 mmol/L (ref 135–145)

## 2019-06-19 LAB — GLUCOSE, CAPILLARY
Glucose-Capillary: 143 mg/dL — ABNORMAL HIGH (ref 70–99)
Glucose-Capillary: 172 mg/dL — ABNORMAL HIGH (ref 70–99)
Glucose-Capillary: 153 mg/dL — ABNORMAL HIGH (ref 70–99)
Glucose-Capillary: 125 mg/dL — ABNORMAL HIGH (ref 70–99)

## 2019-06-19 MED ORDER — IOHEXOL 240 MG/ML SOLN
25.0000 mL | INTRAMUSCULAR | Status: AC
  Administered 2019-06-19 (×2): 25 mL via ORAL

## 2019-06-19 MED ORDER — LABETALOL HCL 5 MG/ML IV SOLN
10.0000 mg | INTRAVENOUS | Status: DC | PRN
  Administered 2019-06-20: 10 mg via INTRAVENOUS
  Filled 2019-06-19: qty 4

## 2019-06-19 MED ORDER — CIPROFLOXACIN IN D5W 400 MG/200ML IV SOLN
400.0000 mg | Freq: Two times a day (BID) | INTRAVENOUS | Status: DC
  Administered 2019-06-19 – 2019-06-21 (×5): 400 mg via INTRAVENOUS
  Filled 2019-06-19 (×6): qty 200

## 2019-06-19 MED ORDER — IOHEXOL 300 MG/ML  SOLN
100.0000 mL | Freq: Once | INTRAMUSCULAR | Status: AC | PRN
  Administered 2019-06-19: 100 mL via INTRAVENOUS

## 2019-06-19 NOTE — Progress Notes (Addendum)
[facility] NAME: [name] [name]    MR#:  [id number]  DATE OF BIRTH:  1981/08/22  SUBJECTIVE:  CHIEF COMPLAINT:   Chief Complaint  Patient presents with  . Abdominal Pain  Patient seen and evaluated today Decreased abdominal pain Has nausea No blurry vision or any weakness or numbness  REVIEW OF SYSTEMS:    ROS  CONSTITUTIONAL: No documented fever. Has fatigue, weakness. No weight gain, no weight loss.  EYES: No blurry or double vision.  ENT: No tinnitus. No postnasal drip. No redness of the oropharynx.  RESPIRATORY: No cough, no wheeze, no hemoptysis. No dyspnea.  CARDIOVASCULAR: No chest pain. No orthopnea. No palpitations. No syncope.  GASTROINTESTINAL: Has nausea, no vomiting or diarrhea. Has abdominal pain. No melena or hematochezia.  GENITOURINARY: No dysuria or hematuria.  ENDOCRINE: No polyuria or nocturia. No heat or cold intolerance.  HEMATOLOGY: No anemia. No bruising. No bleeding.  INTEGUMENTARY: No rashes. No lesions.  MUSCULOSKELETAL: No arthritis. No swelling. No gout.  NEUROLOGIC: No numbness, tingling, or ataxia. No seizure-type activity.  PSYCHIATRIC: No anxiety. No insomnia. No ADD.   DRUG ALLERGIES:   Allergies  Allergen Reactions  . Lisinopril Anaphylaxis, Itching and Swelling  . Rosemary Oil Anaphylaxis  . Shellfish Allergy Itching and Swelling  . Other Itching and Swelling    [place] seasoning  . Metformin And Related Diarrhea, Nausea And Vomiting and Rash    VITALS:  Blood pressure (!) 145/109, pulse 93, temperature 98 F (36.7 C), temperature source Oral, resp. rate 19, height 5\' 5"  (1.651 m), weight 118.2 kg, SpO2 100 %.  PHYSICAL EXAMINATION:   Physical Exam  GENERAL:  38 y.o.-year-old patient lying in the bed with no acute distress.  EYES: Pupils equal, round, reactive to light and accommodation. No scleral icterus. Extraocular muscles intact.  HEENT: Head atraumatic, normocephalic.  Oropharynx and nasopharynx clear.  NECK:  Supple, no jugular venous distention. No thyroid enlargement, no tenderness.  LUNGS: Normal breath sounds bilaterally, no wheezing, rales, rhonchi. No use of accessory muscles of respiration.  CARDIOVASCULAR: S1, S2 normal. No murmurs, rubs, or gallops.  ABDOMEN: Soft, nontender, nondistended. Bowel sounds present. No organomegaly or mass.  EXTREMITIES: No cyanosis, clubbing or edema b/l.    NEUROLOGIC: Cranial nerves II through XII are intact. No focal Motor or sensory deficits b/l.   PSYCHIATRIC: The patient is alert and oriented x 3.  SKIN: No obvious rash, lesion, or ulcer.   LABORATORY PANEL:   CBC Recent Labs  Lab 06/19/19 0643  WBC 13.1*  HGB 12.4  HCT 39.6  PLT 475*   ------------------------------------------------------------------------------------------------------------------ Chemistries  Recent Labs  Lab 06/18/19 0740 06/19/19 0643  NA 141 141  K 3.8 3.9  CL 107 106  CO2 21* 23  GLUCOSE 260* 160*  BUN 19 20  CREATININE 1.42* 1.54*  CALCIUM 9.4 9.0  AST 20  --   ALT 17  --   ALKPHOS 101  --   BILITOT 0.5  --    ------------------------------------------------------------------------------------------------------------------  Cardiac Enzymes No results for input(s): TROPONINI in the last 168 hours. ------------------------------------------------------------------------------------------------------------------  RADIOLOGY:  US Abdomen Limited Ruq  Result Date: 06/18/2019 CLINICAL DATA:  Abdominal pain and vomiting EXAM: ULTRASOUND ABDOMEN LIMITED RIGHT UPPER QUADRANT COMPARISON:  October 18, 2017. FINDINGS: Gallbladder: Within the gallbladder, there is an echogenic focus which moves and shadows consistent with cholelithiasis. Gallstone measures 1.7 cm in length. There is no gallbladder wall thickening or pericholecystic fluid. No sonographic  Murphy sign noted by sonographer. Common bile duct: Diameter: 5 mm. No  intrahepatic or extrahepatic biliary duct dilatation. Liver: Overall, the liver echotexture is increased diffusely. The somewhat echogenic area in the right lobe of the liver seen on previous ultrasound is less well apparent compared to the previous study, although there is subtle increased echogenicity in this area compared to the remainder of the liver. No other potential liver lesion evident. Portal vein is patent on color Doppler imaging with normal direction of blood flow towards the liver. IMPRESSION: 1. Cholelithiasis. No gallbladder wall thickening or pericholecystic fluid. 2. Evidence of hepatic steatosis. In the right lobe of the liver, there is subtle increased echogenicity, less apparent than on the previous study from 2018. Suspect geographic fatty infiltration in this area greater than in other areas. Given concern for possible infiltrating lesion in this area, however, would advise correlation with nonemergent CT or MR of the liver, pre and serial post-contrast for further evaluation. Electronically Signed   By: Lowella Grip III M.D.   On: 06/18/2019 14:20     ASSESSMENT AND PLAN:   38 year old female patient with history of diabetes mellitus type 2, coronary disease, cardiac stent, chronic diastolic heart failure with EF of 30 to 35%, diabetic gastroparesis, hyperlipidemia, hypertension, cardiomyopathy currently under hospitalist service.  -Diabetic gastroparesis IV fluid hydration Antiemetics Trial of Reglan  -Accelerated hypertension Control blood pressure with oral hypertensives and PRN IV hypertensive medication  -Cholelithiasis Surgery consult Check CT abdomen Empiric IV antibiotic  -Diabetes mellitus type 2 Lantus insulin sliding scale coverage with insulin  -Coronary artery disease Continue aspirin, Plavix and metoprolol  -CKD stage III Monitor renal function Avoid nephrotoxic medication  -Chronic diastolic heart failure Medical management Appears  stable  All the records are reviewed and case discussed with Care Management/Social Worker. Management plans discussed with the patient, family and they are in agreement.  CODE STATUS: Full code  DVT Prophylaxis: SCDs  TOTAL TIME TAKING CARE OF THIS PATIENT: 38 minutes.   POSSIBLE D/C IN 2 to 3 DAYS, DEPENDING ON CLINICAL CONDITION.  Saundra Shelling M.D on 06/19/2019 at 2:17 PM  Between 7am to 6pm - Pager - [phone]  After 6pm go to [email or web address] - password [email or web address] [facility]  Office  [phone]  CC: Primary care physician; Venita Lick, NP  Note: This dictation was prepared with Dragon dictation along with smaller phrase technology. Any transcriptional errors that result from this process are unintentional.

## 2019-06-20 ENCOUNTER — Inpatient Hospital Stay: Payer: Medicare Other

## 2019-06-20 DIAGNOSIS — I2510 Atherosclerotic heart disease of native coronary artery without angina pectoris: Secondary | ICD-10-CM

## 2019-06-20 DIAGNOSIS — Z7189 Other specified counseling: Secondary | ICD-10-CM

## 2019-06-20 DIAGNOSIS — Z01810 Encounter for preprocedural cardiovascular examination: Secondary | ICD-10-CM

## 2019-06-20 LAB — BASIC METABOLIC PANEL
Anion gap: 8 (ref 5–15)
BUN: 15 mg/dL (ref 6–20)
CO2: 24 mmol/L (ref 22–32)
Calcium: 8.5 mg/dL — ABNORMAL LOW (ref 8.9–10.3)
Chloride: 106 mmol/L (ref 98–111)
Creatinine, Ser: 1.25 mg/dL — ABNORMAL HIGH (ref 0.44–1.00)
GFR calc Af Amer: >60 mL/min (ref >60)
GFR calc non Af Amer: 54 mL/min — ABNORMAL LOW (ref >60)
Glucose, Bld: 145 mg/dL — ABNORMAL HIGH (ref 70–99)
Potassium: 3.9 mmol/L (ref 3.5–5.1)
Sodium: 138 mmol/L (ref 135–145)

## 2019-06-20 LAB — CBC
HCT: 40.6 % (ref 36.0–46.0)
Hemoglobin: 12.7 g/dL (ref 12.0–15.0)
MCH: 26.1 pg (ref 26.0–34.0)
MCHC: 31.3 g/dL (ref 30.0–36.0)
MCV: 83.5 fL (ref 80.0–100.0)
Platelets: 459 10*3/uL — ABNORMAL HIGH (ref 150–400)
RBC: 4.86 MIL/uL (ref 3.87–5.11)
RDW: 13.2 % (ref 11.5–15.5)
WBC: 9.5 10*3/uL (ref 4.0–10.5)
nRBC: 0 % (ref 0.0–0.2)

## 2019-06-20 LAB — GLUCOSE, CAPILLARY
Glucose-Capillary: 124 mg/dL — ABNORMAL HIGH (ref 70–99)
Glucose-Capillary: 123 mg/dL — ABNORMAL HIGH (ref 70–99)
Glucose-Capillary: 151 mg/dL — ABNORMAL HIGH (ref 70–99)

## 2019-06-20 MED ORDER — SIMETHICONE 80 MG PO CHEW
80.0000 mg | CHEWABLE_TABLET | Freq: Four times a day (QID) | ORAL | Status: DC | PRN
  Filled 2019-06-20: qty 1

## 2019-06-20 MED ORDER — ALUM & MAG HYDROXIDE-SIMETH 200-200-20 MG/5ML PO SUSP
30.0000 mL | Freq: Four times a day (QID) | ORAL | Status: DC | PRN
  Administered 2019-06-20 – 2019-06-21 (×2): 30 mL via ORAL
  Filled 2019-06-20 (×2): qty 30

## 2019-06-20 MED ORDER — TECHNETIUM TC 99M MEBROFENIN IV KIT
5.4500 | PACK | Freq: Once | INTRAVENOUS | Status: AC | PRN
  Administered 2019-06-20: 5.45 via INTRAVENOUS

## 2019-06-20 MED ORDER — PROCHLORPERAZINE EDISYLATE 10 MG/2ML IJ SOLN
10.0000 mg | Freq: Four times a day (QID) | INTRAMUSCULAR | Status: DC | PRN
  Administered 2019-06-20: 10 mg via INTRAVENOUS
  Filled 2019-06-20 (×2): qty 2

## 2019-06-20 NOTE — Plan of Care (Signed)
  Problem: Education: Goal: Knowledge of General Education information will improve Description: Including pain rating scale, medication(s)/side effects and non-pharmacologic comfort measures Outcome: Progressing   Problem: Pain Managment: Goal: General experience of comfort will improve Outcome: Progressing   

## 2019-06-20 NOTE — Consult Note (Signed)
Jonathon Bellows , MD [address], [place], [place], [place], [address] [address], [place], [place], [place], [address] Phone: [phone]  Fax: [phone]  Consultation  Referring Provider:     Dr Dahlia Byes Primary Care Physician:  Venita Lick, NP Primary Gastroenterologist:  Dr. Festus Aloe         Reason for Consultation:     Abdominal pain   Date of Admission:  06/18/2019 Date of Consultation:  06/20/2019         HPI:   [name] [name] is a 38 y.o. female has a history of coronary artery disease, status post stents, diabetic gastroparesis, diabetes and chronic kidney disease.  She follows at [facility].  Last office visit in January 2020 it appears that the abdominal pain was being treated with PPI, Motegrity for constipation, Gaviscon for heartburn.  She has been diagnosed with a functional bowel disorder.  Gastric emptying study in 07/28/2018 showed mildly delayed gastric emptying.  She has had previous upper endoscopy and colonoscopy although I cannot see it being performed I cannot read the report on epic.  On this admission was admitted on 06/18/2019 with abdominal pain.  This was associated with nausea.  Some blood in the vomitus.  Patient does have a history of diabetes and smoking marijuana.  CT scan of the abdomen performed showed fatty infiltration of the liver but no acute abnormality.  Right upper quadrant ultrasound showed cholelithiasis, no gallbladder wall thickening or pericholecystic fluid.  There was an area in the liver which was not very clear and the radiologist suggested to obtain a CT or MRI of the liver with pre-and serial postcontrast for further evaluation.   On admission her transaminases, total bilirubin were normal.  Lipase 23.  She has a history of CAD and hence on aspirin and Plavix.  The patient was seen by Dr. Dahlia Byes surgery.  HIDA scan has been ordered.  Urine is positive for cannabinoids.  Was similarly +2 months back.  She says she can throw up a a few mins  to hours after eating , worse with fatty foods, chronic issue no different on present admission .    Past Medical History:  Diagnosis Date  . CAD (coronary artery disease)    a. 03/2015 NSTEMI/PCI: LCX 182m (DES), OM1 100p (DES - vessel labeled Ramus in subsequent cath report). Minor irregs to LAD/RCA; b. 04/2015 Cath: LM nl, LAD 40p, RI patent stent, LCX patent stent, RCA nl, EF 55-65%; c. 02/2016 MV: basal inferolateral and mid inferolateral defect/infarct w/o ischemia. EF 55-65%; d. 11/2018 PCI: LM 30ost, LAD 30p, LCX 95p/m (PTCA->20%), OM1 10ost, OM2 40ost, RCA 85p.  Marland Kitchen Chronic abdominal pain   . Chronic diastolic (congestive) heart failure ([facility])    a. 03/2015 Echo: EF 30-35%, mild concentric LVH, severe HK of inf, inflat, & lat walls, mod MR, mild TR;  b. 04/2015 LV Gram: EF 55-65%; c. 09/2017 Echo: EF 55-60%, no rwma. Mild MR; d. 11/2018 Echo: Ef 60-65%, no rwma.  . CKD (chronic kidney disease), stage III ([facility])   . Diabetes type 2, controlled ([facility])    a. since 2002   . Diabetic gastroparesis ([facility])    a. chronic nausea/vomiting.  . Diverticulosis, sigmoid 07/2016  . Heavy menses    a. H/O IUD - expired in 2014 - remains in place.  . Hyperlipidemia   . Hypertension   . Iron deficiency anemia   . Ischemic cardiomyopathy (resolved)    a. 03/2015 EF 30-35% post NSTEMI;  b. 04/2015 EF 55-65% on LV gram; c. 09/2017 Echo: EF 55-60%; d. 11/2018 Echo: Ef 60-65%.  . Obesity   . Tobacco abuse    a. quit 03/2015.  Marland Kitchen Uterine fibroid   . Vertigo   . Vitamin D deficiency     Past Surgical History:  Procedure Laterality Date  . APPENDECTOMY    . CARDIAC CATHETERIZATION  4/16   x2 stent [facility]  . CARDIAC CATHETERIZATION N/A 05/05/2015   Procedure: Left Heart Cath and Coronary Angiography;  Surgeon: Peter M Martinique, MD;  Location: [facility];  Service: Cardiovascular;  Laterality: N/A;  . COLONOSCOPY WITH PROPOFOL N/A 07/31/2016   Procedure: COLONOSCOPY WITH PROPOFOL;  Surgeon: Lollie Sails,  MD;  Location: [facility] ENDOSCOPY;  Service: Endoscopy;  Laterality: N/A;  . CORONARY BALLOON ANGIOPLASTY N/A 12/04/2018   Procedure: CORONARY BALLOON ANGIOPLASTY;  Surgeon: Wellington Hampshire, MD;  Location: [facility];  Service: Cardiovascular;  Laterality: N/A;  . CORONARY STENT INTERVENTION N/A 01/12/2019   Procedure: CORONARY STENT INTERVENTION;  Surgeon: [name], MD;  Location: [facility];  Service: Cardiovascular;  Laterality: N/A;  . ESOPHAGOGASTRODUODENOSCOPY (EGD) WITH PROPOFOL N/A 07/31/2016   Procedure: ESOPHAGOGASTRODUODENOSCOPY (EGD) WITH PROPOFOL;  Surgeon: Lollie Sails, MD;  Location: [facility] ENDOSCOPY;  Service: Endoscopy;  Laterality: N/A;  . INTRAVASCULAR PRESSURE WIRE/FFR STUDY N/A 12/04/2018   Procedure: INTRAVASCULAR PRESSURE WIRE/FFR STUDY;  Surgeon: Wellington Hampshire, MD;  Location: [facility];  Service: Cardiovascular;  Laterality: N/A;  . LAPAROSCOPIC APPENDECTOMY N/A 08/07/2016   Procedure: APPENDECTOMY LAPAROSCOPIC;  Surgeon: Hubbard Robinson, MD;  Location: [facility] ORS;  Service: General;  Laterality: N/A;  . LEFT HEART CATH AND CORONARY ANGIOGRAPHY Left 12/04/2018   Procedure: LEFT HEART CATH AND CORONARY ANGIOGRAPHY;  Surgeon: Wellington Hampshire, MD;  Location: [facility];  Service: Cardiovascular;  Laterality: Left;  . LEFT HEART CATH AND CORONARY ANGIOGRAPHY N/A 01/12/2019   Procedure: LEFT HEART CATH AND CORONARY ANGIOGRAPHY;  Surgeon: [name], MD;  Location: [facility];  Service: Cardiovascular;  Laterality: N/A;  . MOUTH SURGERY      Prior to Admission medications   Medication Sig Start Date End Date Taking? Authorizing Provider  amitriptyline (ELAVIL) 25 MG tablet Take 25 mg by mouth at bedtime. 04/18/19  Yes [provider]  aspirin EC 81 MG tablet Take 81 mg by mouth daily.   Yes [provider]  Cholecalciferol 2000 units TABS Take 2,000 Units by mouth daily.    Yes [provider]  clopidogrel (PLAVIX) 75 MG tablet Take 4 tabs at 7pm tonight (12/28), then 1 tab daily beginning 12/29. Patient taking differently: Take 75 mg by mouth daily.  12/05/18  Yes Theora Gianotti, NP  DULoxetine (CYMBALTA) 30 MG capsule Take 60 mg by mouth daily.  07/01/18  Yes [provider]  etonogestrel (NEXPLANON) 68 MG IMPL implant 1 each by Subdermal route once.   Yes [provider]  FARXIGA 10 MG TABS tablet Take 10 mg by mouth daily.  09/08/18  Yes [provider]  furosemide (LASIX) 40 MG tablet Take 1 tablet (40 mg total) by mouth every other day. 01/19/19 07/18/19 Yes End, Harrell Gave, MD  glimepiride (AMARYL) 4 MG tablet TAKE 1 TABLET BY MOUTH ONCE DAILY Patient taking differently: Take 4 mg by mouth daily.  10/21/18  Yes Cannady, Jolene T, NP  insulin aspart (NOVOLOG) 100 UNIT/ML injection Inject 10 Units into the skin 3 (  three) times daily with meals. 05/04/18  Yes Wieting, Richard, MD  irbesartan (AVAPRO) 75 MG tablet Take 1 tablet (75 mg total) by mouth daily. 04/29/19  Yes Wellington Hampshire, MD  isosorbide mononitrate (IMDUR) 30 MG 24 hr tablet Take 2 tablets (60 mg total) by mouth daily. 12/17/18  Yes Dunn, Areta Haber, PA-C  loperamide (IMODIUM A-D) 2 MG tablet Take 8-12 mg by mouth daily as needed for diarrhea or loose stools.   Yes [provider]  meclizine (ANTIVERT) 25 MG tablet Take 25 mg by mouth 3 (three) times daily as needed for dizziness.   Yes [provider]  metoCLOPramide (REGLAN) 10 MG tablet Take 1 tablet (10 mg total) by mouth 3 (three) times daily with meals for 10 days. 04/17/19 06/18/19 Yes Menshew, Dannielle Karvonen, PA-C  metoprolol tartrate (LOPRESSOR) 100 MG tablet Take 1 tablet (100 mg total) by mouth 2 (two) times daily. 04/29/19  Yes Wellington Hampshire, MD  mirtazapine (REMERON) 30 MG tablet Take 60 mg by mouth daily.  08/12/18  Yes [provider]  nitroGLYCERIN (NITROSTAT) 0.4 MG SL tablet Take 1 tab  under your tongue while sitting for chest pain, If no relief may repeat, one tab every 5 min up to 3 tabs total over 15 mins. 06/09/18  Yes Theora Gianotti, NP  ondansetron (ZOFRAN) 4 MG tablet Take 1 tablet (4 mg total) by mouth every 8 (eight) hours as needed for nausea or vomiting. 04/16/19  Yes Schuyler Amor, MD  pantoprazole (PROTONIX) 40 MG tablet TAKE 1 TABLET BY MOUTH ONCE DAILY AT 6AM Patient taking differently: Take 40 mg by mouth 2 (two) times daily.  10/21/18  Yes Cannady, Jolene T, NP  polyethylene glycol (MIRALAX / GLYCOLAX) 17 g packet Take 17 g by mouth daily as needed.  04/18/19  Yes [provider]  insulin glargine (LANTUS) 100 UNIT/ML injection Inject 60 Units into the skin daily.    [provider]    Family History  Problem Relation Age of Onset  . Diabetes Father   . Cancer Maternal Grandmother        lung  . Cancer Maternal Grandfather        prostate  . Diabetes Paternal Grandfather   . Diabetes Paternal Grandmother   . Cancer - Cervical Maternal Aunt      Social History   Tobacco Use  . Smoking status: Former Smoker    Years: 15.00    Quit date: 03/10/2015    Years since quitting: 4.2  . Smokeless tobacco: Never Used  Substance Use Topics  . Alcohol use: No    Alcohol/week: 0.0 standard drinks  . Drug use: No    Allergies as of 06/18/2019 - Review Complete 06/18/2019  Allergen Reaction Noted  . Lisinopril Anaphylaxis, Itching, and Swelling 04/15/2015  . Rosemary oil Anaphylaxis 07/20/2016  . Shellfish allergy Itching and Swelling 04/13/2015  . Other Itching and Swelling 08/25/2012  . Metformin and related Diarrhea, Nausea And Vomiting, and Rash 07/04/2016    Review of Systems:    All systems reviewed and negative except where noted in HPI.   Physical Exam:  Vital signs in last 24 hours: Temp:  [98 F (36.7 C)-99 F (37.2 C)] 98 F (36.7 C) (07/12 0802) Pulse Rate:  [77-83] 77 (07/12 0802) Resp:  [15-19] 15 (07/12  0802) BP: (152-168)/(85-94) 155/91 (07/12 0802) SpO2:  [95 %-100 %] 98 % (07/12 0802) Weight:  [119.3 kg] 119.3 kg (07/12 0504) Last  BM Date: 06/17/19 General:   Pleasant, cooperative in NAD Head:  Normocephalic and atraumatic. Eyes:   No icterus.   Conjunctiva pink. PERRLA. Ears:  Normal auditory acuity. Neck:  Supple; no masses or thyroidomegaly Lungs: Respirations even and unlabored. Lungs clear to auscultation bilaterally.   No wheezes, crackles, or rhonchi.  Heart:  Regular rate and rhythm;  Without murmur, clicks, rubs or gallops Abdomen:  Soft, nondistended, nontender. Normal bowel sounds. No appreciable masses or hepatomegaly.  No rebound or guarding.  Neurologic:  Alert and oriented x3;  grossly normal neurologically. Skin:  Intact without significant lesions or rashes. Cervical Nodes:  No significant cervical adenopathy. Psych:  Alert and cooperative. Normal affect.  LAB RESULTS: Recent Labs    06/18/19 0740 06/19/19 0643 06/20/19 0603  WBC 8.9 13.1* 9.5  HGB 12.0 12.4 12.7  HCT 37.3 39.6 40.6  PLT 470* 475* 459*   BMET Recent Labs    06/18/19 0740 06/19/19 0643 06/20/19 0603  NA 141 141 138  K 3.8 3.9 3.9  CL 107 106 106  CO2 21* 23 24  GLUCOSE 260* 160* 145*  BUN 19 20 15   CREATININE 1.42* 1.54* 1.25*  CALCIUM 9.4 9.0 8.5*   LFT Recent Labs    06/18/19 0740  PROT 7.6  ALBUMIN 3.5  AST 20  ALT 17  ALKPHOS 101  BILITOT 0.5   PT/INR No results for input(s): LABPROT, INR in the last 72 hours.  STUDIES: Ct Abdomen Pelvis W Contrast  Result Date: 06/19/2019 CLINICAL DATA:  Right upper quadrant pain EXAM: CT ABDOMEN AND PELVIS WITH CONTRAST TECHNIQUE: Multidetector CT imaging of the abdomen and pelvis was performed using the standard protocol following bolus administration of intravenous contrast. CONTRAST:  171mL OMNIPAQUE IOHEXOL 300 MG/ML  SOLN COMPARISON:  Ultrasound from the previous day, CT from 04/16/2019 FINDINGS: Lower chest: No acute  abnormality. Hepatobiliary: Fatty infiltration of the liver is noted. The known gallstone is not well appreciated within the gallbladder. No focal mass is identified. Previously described abnormality within the right lobe of the liver isn't appreciated on this exam and was not seen on prior MRI from 11/04/2017. Pancreas: Unremarkable. No pancreatic ductal dilatation or surrounding inflammatory changes. Spleen: Normal in size without focal abnormality. Adrenals/Urinary Tract: Adrenal glands are within normal limits. Kidneys demonstrate a normal enhancement pattern bilaterally. No obstructive changes are noted. The ureters are within normal limits bilaterally. The bladder is partially distended. Stomach/Bowel: The appendix has been surgically removed. No obstructive or inflammatory changes of the larger small-bowel are seen. The stomach is within normal limits. Vascular/Lymphatic: Aortic atherosclerosis. No enlarged abdominal or pelvic lymph nodes. Note is made of retroaortic left renal vein. Reproductive: Uterus and bilateral adnexa are unremarkable. Other: No abdominal wall hernia or abnormality. No abdominopelvic ascites. Musculoskeletal: No acute or significant osseous findings. IMPRESSION: Fatty infiltration of the liver without focal mass. No acute abnormality noted. Electronically Signed   By: Inez Catalina M.D.   On: 06/19/2019 18:01   US Abdomen Limited Ruq  Result Date: 06/18/2019 CLINICAL DATA:  Abdominal pain and vomiting EXAM: ULTRASOUND ABDOMEN LIMITED RIGHT UPPER QUADRANT COMPARISON:  October 18, 2017. FINDINGS: Gallbladder: Within the gallbladder, there is an echogenic focus which moves and shadows consistent with cholelithiasis. Gallstone measures 1.7 cm in length. There is no gallbladder wall thickening or pericholecystic fluid. No sonographic Murphy sign noted by sonographer. Common bile duct: Diameter: 5 mm. No intrahepatic or extrahepatic biliary duct dilatation. Liver: Overall, the liver  echotexture is increased diffusely.  The somewhat echogenic area in the right lobe of the liver seen on previous ultrasound is less well apparent compared to the previous study, although there is subtle increased echogenicity in this area compared to the remainder of the liver. No other potential liver lesion evident. Portal vein is patent on color Doppler imaging with normal direction of blood flow towards the liver. IMPRESSION: 1. Cholelithiasis. No gallbladder wall thickening or pericholecystic fluid. 2. Evidence of hepatic steatosis. In the right lobe of the liver, there is subtle increased echogenicity, less apparent than on the previous study from 2018. Suspect geographic fatty infiltration in this area greater than in other areas. Given concern for possible infiltrating lesion in this area, however, would advise correlation with nonemergent CT or MR of the liver, pre and serial post-contrast for further evaluation. Electronically Signed   By: Lowella Grip III M.D.   On: 06/18/2019 14:20      Impression / Plan:   [name] [name] is a 38 y.o. y/o female with known past medical history of coronary artery disease, chronic kidney disease, diabetes, morbid obesity, on aspirin and Plavix for cardiac stents.  She has known to have a functional GI disorder followed at [facility].  She has had prior GI evaluations and the treatment course has been in the lines of antiemetics, acid suppressants, treatment of her underlying constipation.  I agree with Dr. Barb Merino recommendations to obtain a HIDA scan to further evaluate the abdominal pain and rule out blockage of the cystic duct.  In addition the only thing I can probably add is that her urine drug test has revealed marijuana and this definitely does not help her clinical picture.  Cannabinoid hyperemesis syndrome can present with all the symptoms.  It can also make gastroparesis worse.  Definitely this is something that she would need to stop as it has no benefits  in her case.  Plan 1.  Outpatient MRI or CT liver mass protocol to evaluate abnormal appearing area on right upper quadrant ultrasound.  2.  Follow-up HIDA scan  3.  Stop all use of marijuana in the future.  Limit narcotics.  Low fiber diet.  Low-calorie diet.  Small meals more often than large meals at one time.  This will help her gastroparesis.  Tight glycemic control which is probably doing well at this point of time with a blood sugar generally less than 150.  4.  CT scan of the abdomen does not show any blockages in the GI tract. IF no better in 24-48 hours and no other reason seen , then may need EGD to r/o gastric outlet obstruction vs a Bezoar (rarely seen in diabetics and with gastroparesis)  Probably for completion process I will check her H. pylori stool antigen.  Thank you for involving me in the care of this patient.      LOS: 2 days   Jonathon Bellows, MD  06/20/2019, 9:36 AM

## 2019-06-20 NOTE — Consult Note (Signed)
Cardiology Consultation:   Patient ID: [name] [name] MRN: [id number]; DOB: [birth year]  Admit date: 06/18/2019 Date of Consult: 06/20/2019  Primary Care Provider: Venita Lick, NP Primary Cardiologist: Kathlyn Sacramento, MD  Primary Electrophysiologist:  None    Patient Profile:   [name] [name] is a 38 y.o. female with a hx of coronary artery disease, type 2 diabetes, ischemic cardiomyopathy with normalization of LV function, and stage II-III CKD who is being seen today for the evaluation of preoperative evaluation at the request of [place].  History of Present Illness:   [name] [name] is a 38 year old female with a past history significant for type 2 diabetes with gastroparesis, coronary artery disease, ischemic cardiomyopathy with subsequent normalization of LV systolic function, stage II-III CKD, hypertension, hyperlipidemia, and obesity.  She recently had an RCA stent placed February 2020.  Since that time she has done well.  She has had no chest pain or shortness of breath.  She is able to do all of her daily activities without restriction.  She did have systolic heart failure with an ejection fraction of 30 to 35% was found to have severe circumflex and OM1 stenosis.  She had drug-eluting stents placed in these areas.  Her LV function did normalize.  She subsequently had balloon angioplasty of the RCA in February 2020.  Heart Pathway Score:     Past Medical History:  Diagnosis Date   CAD (coronary artery disease)    a. 03/2015 NSTEMI/PCI: LCX 141m (DES), OM1 100p (DES - vessel labeled Ramus in subsequent cath report). Minor irregs to LAD/RCA; b. 04/2015 Cath: LM nl, LAD 40p, RI patent stent, LCX patent stent, RCA nl, EF 55-65%; c. 02/2016 MV: basal inferolateral and mid inferolateral defect/infarct w/o ischemia. EF 55-65%; d. 11/2018 PCI: LM 30ost, LAD 30p, LCX 95p/m (PTCA->20%), OM1 10ost, OM2 40ost, RCA 85p.   Chronic abdominal pain    Chronic diastolic (congestive) heart failure  ([place])    a. 03/2015 Echo: EF 30-35%, mild concentric LVH, severe HK of inf, inflat, & lat walls, mod MR, mild TR;  b. 04/2015 LV Gram: EF 55-65%; c. 09/2017 Echo: EF 55-60%, no rwma. Mild MR; d. 11/2018 Echo: Ef 60-65%, no rwma.   CKD (chronic kidney disease), stage III ([place])    Diabetes type 2, controlled ([place])    a. since 2002    Diabetic gastroparesis ([facility])    a. chronic nausea/vomiting.   Diverticulosis, sigmoid 07/2016   Heavy menses    a. H/O IUD - expired in 2014 - remains in place.   Hyperlipidemia    Hypertension    Iron deficiency anemia    Ischemic cardiomyopathy (resolved)    a. 03/2015 EF 30-35% post NSTEMI;  b. 04/2015 EF 55-65% on LV gram; c. 09/2017 Echo: EF 55-60%; d. 11/2018 Echo: Ef 60-65%.   Obesity    Tobacco abuse    a. quit 03/2015.   Uterine fibroid    Vertigo    Vitamin D deficiency     Past Surgical History:  Procedure Laterality Date   APPENDECTOMY     CARDIAC CATHETERIZATION  4/16   x2 stent [place] N/A 05/05/2015   Procedure: Left Heart Cath and Coronary Angiography;  Surgeon: Peter M Martinique, MD;  Location: [facility];  Service: Cardiovascular;  Laterality: N/A;   COLONOSCOPY WITH PROPOFOL N/A 07/31/2016   Procedure: COLONOSCOPY WITH PROPOFOL;  Surgeon: Lollie Sails, MD;  Location: [facility] ENDOSCOPY;  Service: Endoscopy;  Laterality: N/A;  CORONARY BALLOON ANGIOPLASTY N/A 12/04/2018   Procedure: CORONARY BALLOON ANGIOPLASTY;  Surgeon: Wellington Hampshire, MD;  Location: [facility];  Service: Cardiovascular;  Laterality: N/A;   CORONARY STENT INTERVENTION N/A 01/12/2019   Procedure: CORONARY STENT INTERVENTION;  Surgeon: [name], MD;  Location: [facility];  Service: Cardiovascular;  Laterality: N/A;   ESOPHAGOGASTRODUODENOSCOPY (EGD) WITH PROPOFOL N/A 07/31/2016   Procedure: ESOPHAGOGASTRODUODENOSCOPY (EGD) WITH PROPOFOL;  Surgeon: Lollie Sails, MD;  Location: [facility] ENDOSCOPY;   Service: Endoscopy;  Laterality: N/A;   INTRAVASCULAR PRESSURE WIRE/FFR STUDY N/A 12/04/2018   Procedure: INTRAVASCULAR PRESSURE WIRE/FFR STUDY;  Surgeon: Wellington Hampshire, MD;  Location: [facility];  Service: Cardiovascular;  Laterality: N/A;   LAPAROSCOPIC APPENDECTOMY N/A 08/07/2016   Procedure: APPENDECTOMY LAPAROSCOPIC;  Surgeon: Hubbard Robinson, MD;  Location: [facility];  Service: General;  Laterality: N/A;   LEFT HEART CATH AND CORONARY ANGIOGRAPHY Left 12/04/2018   Procedure: LEFT HEART CATH AND CORONARY ANGIOGRAPHY;  Surgeon: Wellington Hampshire, MD;  Location: [facility];  Service: Cardiovascular;  Laterality: Left;   LEFT HEART CATH AND CORONARY ANGIOGRAPHY N/A 01/12/2019   Procedure: LEFT HEART CATH AND CORONARY ANGIOGRAPHY;  Surgeon: [name], MD;  Location: [facility];  Service: Cardiovascular;  Laterality: N/A;   MOUTH SURGERY       Home Medications:  Prior to Admission medications   Medication Sig Start Date End Date Taking? Authorizing Provider  amitriptyline (ELAVIL) 25 MG tablet Take 25 mg by mouth at bedtime. 04/18/19  Yes [provider]  aspirin EC 81 MG tablet Take 81 mg by mouth daily.   Yes [provider]  Cholecalciferol 2000 units TABS Take 2,000 Units by mouth daily.    Yes [provider]  clopidogrel (PLAVIX) 75 MG tablet Take 4 tabs at 7pm tonight (12/28), then 1 tab daily beginning 12/29. Patient taking differently: Take 75 mg by mouth daily.  12/05/18  Yes Theora Gianotti, NP  DULoxetine (CYMBALTA) 30 MG capsule Take 60 mg by mouth daily.  07/01/18  Yes [provider]  etonogestrel (NEXPLANON) 68 MG IMPL implant 1 each by Subdermal route once.   Yes [provider]  FARXIGA 10 MG TABS tablet Take 10 mg by mouth daily.  09/08/18  Yes [provider]  furosemide (LASIX) 40 MG tablet Take 1 tablet (40 mg total) by mouth every other day. 01/19/19 07/18/19 Yes End,  Harrell Gave, MD  glimepiride (AMARYL) 4 MG tablet TAKE 1 TABLET BY MOUTH ONCE DAILY Patient taking differently: Take 4 mg by mouth daily.  10/21/18  Yes Cannady, Jolene T, NP  insulin aspart (NOVOLOG) 100 UNIT/ML injection Inject 10 Units into the skin 3 (three) times daily with meals. 05/04/18  Yes Wieting, Richard, MD  irbesartan (AVAPRO) 75 MG tablet Take 1 tablet (75 mg total) by mouth daily. 04/29/19  Yes Wellington Hampshire, MD  isosorbide mononitrate (IMDUR) 30 MG 24 hr tablet Take 2 tablets (60 mg total) by mouth daily. 12/17/18  Yes Dunn, Areta Haber, PA-C  loperamide (IMODIUM A-D) 2 MG tablet Take 8-12 mg by mouth daily as needed for diarrhea or loose stools.   Yes [provider]  meclizine (ANTIVERT) 25 MG tablet Take 25 mg by mouth 3 (three) times daily as needed for dizziness.   Yes [provider]  metoCLOPramide (REGLAN) 10 MG tablet Take 1 tablet (10 mg total) by mouth 3 (three) times daily with meals for 10 days.  04/17/19 06/18/19 Yes Menshew, Dannielle Karvonen, PA-C  metoprolol tartrate (LOPRESSOR) 100 MG tablet Take 1 tablet (100 mg total) by mouth 2 (two) times daily. 04/29/19  Yes Wellington Hampshire, MD  mirtazapine (REMERON) 30 MG tablet Take 60 mg by mouth daily.  08/12/18  Yes [provider]  nitroGLYCERIN (NITROSTAT) 0.4 MG SL tablet Take 1 tab under your tongue while sitting for chest pain, If no relief may repeat, one tab every 5 min up to 3 tabs total over 15 mins. 06/09/18  Yes Theora Gianotti, NP  ondansetron (ZOFRAN) 4 MG tablet Take 1 tablet (4 mg total) by mouth every 8 (eight) hours as needed for nausea or vomiting. 04/16/19  Yes Schuyler Amor, MD  pantoprazole (PROTONIX) 40 MG tablet TAKE 1 TABLET BY MOUTH ONCE DAILY AT 6AM Patient taking differently: Take 40 mg by mouth 2 (two) times daily.  10/21/18  Yes Cannady, Jolene T, NP  polyethylene glycol (MIRALAX / GLYCOLAX) 17 g packet Take 17 g by mouth daily as needed.  04/18/19  Yes [provider]  insulin glargine (LANTUS) 100 UNIT/ML injection Inject 60 Units into the skin daily.    [provider]    Inpatient Medications: Scheduled Meds:  amitriptyline  25 mg Oral QHS   cholecalciferol  2,000 Units Oral Daily   clopidogrel  75 mg Oral QHS   DULoxetine  60 mg Oral Daily   enoxaparin (LOVENOX) injection  40 mg Subcutaneous Q12H   insulin aspart  0-5 Units Subcutaneous QHS   insulin aspart  0-9 Units Subcutaneous TID WC   insulin glargine  30 Units Subcutaneous Daily   irbesartan  75 mg Oral Daily   isosorbide mononitrate  60 mg Oral Daily   metoCLOPramide (REGLAN) injection  5 mg Intravenous Q6H   metoprolol tartrate  100 mg Oral BID   mirtazapine  60 mg Oral QHS   pantoprazole  40 mg Oral BID   Continuous Infusions:  sodium chloride 50 mL/hr at 06/20/19 0343   ciprofloxacin 400 mg (06/20/19 1026)   PRN Meds: acetaminophen **OR** acetaminophen, labetalol, meclizine, morphine injection, nitroGLYCERIN, ondansetron **OR** ondansetron (ZOFRAN) IV, oxyCODONE, polyethylene glycol, prochlorperazine  Allergies:    Allergies  Allergen Reactions   Lisinopril Anaphylaxis, Itching and Swelling   Rosemary Oil Anaphylaxis   Shellfish Allergy Itching and Swelling   Other Itching and Swelling    [place] seasoning   Metformin And Related Diarrhea, Nausea And Vomiting and Rash    Social History:   Social History   Socioeconomic History   Marital status: Married    Spouse name: Loss adjuster, chartered   Number of children: Not on file   Years of education: Not on file   Highest education level: Not on file  Occupational History   Not on file  Social Needs   Financial resource strain: Not on file   Food insecurity    Worry: Not on file    Inability: Not on file   Transportation needs    Medical: Not on file    Non-medical: Not on file  Tobacco Use   Smoking status: Former Smoker    Years: 15.00    Quit date: 03/10/2015    Years  since quitting: 4.2   Smokeless tobacco: Never Used  Substance and Sexual Activity   Alcohol use: No    Alcohol/week: 0.0 standard drinks   Drug use: No   Sexual activity: Yes    Birth control/protection: Implant  Lifestyle  Physical activity    Days per week: Not on file    Minutes per session: Not on file   Stress: Not on file  Relationships   Social connections    Talks on phone: Not on file    Gets together: Not on file    Attends religious service: Not on file    Active member of club or organization: Not on file    Attends meetings of clubs or organizations: Not on file    Relationship status: Not on file   Intimate partner violence    Fear of current or ex partner: Not on file    Emotionally abused: Not on file    Physically abused: Not on file    Forced sexual activity: Not on file  Other Topics Concern   Not on file  Social History Narrative   Lives locally with daughter and husband.    Family History:    Family History  Problem Relation Age of Onset   Diabetes Father    Cancer Maternal Grandmother        lung   Cancer Maternal Grandfather        prostate   Diabetes Paternal Grandfather    Diabetes Paternal Grandmother    Cancer - Cervical Maternal Aunt      ROS:  Please see the history of present illness.   All other ROS reviewed and negative.     Physical Exam/Data:   Vitals:   06/19/19 1625 06/19/19 2020 06/20/19 0504 06/20/19 0802  BP: (!) 168/94 (!) 164/87 (!) 152/85 (!) 155/91  Pulse: 77 83 81 77  Resp: 19 17 16 15   Temp: 98.3 F (36.8 C) 99 F (37.2 C) 98.1 F (36.7 C) 98 F (36.7 C)  TempSrc: Oral Oral Oral Oral  SpO2: 95% 100% 100% 98%  Weight:   119.3 kg   Height:        Intake/Output Summary (Last 24 hours) at 06/20/2019 1126 Last data filed at 06/20/2019 0343 Gross per 24 hour  Intake 1422.05 ml  Output 600 ml  Net 822.05 ml   Last 3 Weights 06/20/2019 06/18/2019 06/18/2019  Weight (lbs) 263 lb 260 lb 9.6 oz  270 lb  Weight (kg) 119.296 kg 118.207 kg 122.471 kg     Body mass index is 43.77 kg/m.  General:  Well nourished, well developed, in no acute distress HEENT: normal Lymph: no adenopathy Neck: no JVD Endocrine:  No thryomegaly Vascular: No carotid bruits; FA pulses 2+ bilaterally without bruits  Cardiac:  normal S1, S2; RRR; no murmur  Lungs:  clear to auscultation bilaterally, no wheezing, rhonchi or rales  Abd: soft, nontender, no hepatomegaly  Ext: no edema Musculoskeletal:  No deformities, BUE and BLE strength normal and equal Skin: warm and dry  Neuro:  CNs 2-12 intact, no focal abnormalities noted Psych:  Normal affect   EKG:  The EKG was personally reviewed and demonstrates: Sinus tachycardia, rate 106 Telemetry:  Telemetry was personally reviewed and demonstrates: Sinus rhythm  Relevant CV Studies: LHC 01/12/19 1. Stable appearance of coronary arteries since 12/04/2018, with 80-90% mid RCA disease and 20-30% in-stent restenosis in mid LCx. 2. Moderately to severely elevated left ventricular filling pressure. 3. Successful PCI to mid RCA using Synergy 2.5 x 32 mm drug-eluting stent (postdilated to 2.8 mm) with 0% residual stenosis and TIMI-3 flow.  TTE 12/01/18 - Left ventricle: The cavity size was normal. There was moderate   concentric hypertrophy. Systolic function was normal. The  estimated ejection fraction was in the range of 60% to 65%. Wall   motion was normal; there were no regional wall motion   abnormalities. Left ventricular diastolic function parameters   were normal. - Pulmonary arteries: Systolic pressure could not be accurately   estimated.  Laboratory Data:  High Sensitivity Troponin:  No results for input(s): TROPONINIHS in the last 720 hours.   Cardiac EnzymesNo results for input(s): TROPONINI in the last 168 hours. No results for input(s): TROPIPOC in the last 168 hours.  Chemistry Recent Labs  Lab 06/18/19 0740 06/19/19 0643 06/20/19 0603    NA 141 141 138  K 3.8 3.9 3.9  CL 107 106 106  CO2 21* 23 24  GLUCOSE 260* 160* 145*  BUN 19 20 15   CREATININE 1.42* 1.54* 1.25*  CALCIUM 9.4 9.0 8.5*  GFRNONAA 46* 42* 54*  GFRAA 54* 49* >60  ANIONGAP 13 12 8     Recent Labs  Lab 06/18/19 0740  PROT 7.6  ALBUMIN 3.5  AST 20  ALT 17  ALKPHOS 101  BILITOT 0.5   Hematology Recent Labs  Lab 06/18/19 0740 06/19/19 0643 06/20/19 0603  WBC 8.9 13.1* 9.5  RBC 4.52 4.77 4.86  HGB 12.0 12.4 12.7  HCT 37.3 39.6 40.6  MCV 82.5 83.0 83.5  MCH 26.5 26.0 26.1  MCHC 32.2 31.3 31.3  RDW 13.7 13.8 13.2  PLT 470* 475* 459*   BNPNo results for input(s): BNP, PROBNP in the last 168 hours.  DDimer No results for input(s): DDIMER in the last 168 hours.   Radiology/Studies:  Ct Abdomen Pelvis W Contrast  Result Date: 06/19/2019 CLINICAL DATA:  Right upper quadrant pain EXAM: CT ABDOMEN AND PELVIS WITH CONTRAST TECHNIQUE: Multidetector CT imaging of the abdomen and pelvis was performed using the standard protocol following bolus administration of intravenous contrast. CONTRAST:  155mL OMNIPAQUE IOHEXOL 300 MG/ML  SOLN COMPARISON:  Ultrasound from the previous day, CT from 04/16/2019 FINDINGS: Lower chest: No acute abnormality. Hepatobiliary: Fatty infiltration of the liver is noted. The known gallstone is not well appreciated within the gallbladder. No focal mass is identified. Previously described abnormality within the right lobe of the liver isn't appreciated on this exam and was not seen on prior MRI from 11/04/2017. Pancreas: Unremarkable. No pancreatic ductal dilatation or surrounding inflammatory changes. Spleen: Normal in size without focal abnormality. Adrenals/Urinary Tract: Adrenal glands are within normal limits. Kidneys demonstrate a normal enhancement pattern bilaterally. No obstructive changes are noted. The ureters are within normal limits bilaterally. The bladder is partially distended. Stomach/Bowel: The appendix has been  surgically removed. No obstructive or inflammatory changes of the larger small-bowel are seen. The stomach is within normal limits. Vascular/Lymphatic: Aortic atherosclerosis. No enlarged abdominal or pelvic lymph nodes. Note is made of retroaortic left renal vein. Reproductive: Uterus and bilateral adnexa are unremarkable. Other: No abdominal wall hernia or abnormality. No abdominopelvic ascites. Musculoskeletal: No acute or significant osseous findings. IMPRESSION: Fatty infiltration of the liver without focal mass. No acute abnormality noted. Electronically Signed   By: Inez Catalina M.D.   On: 06/19/2019 18:01   US Abdomen Limited Ruq  Result Date: 06/18/2019 CLINICAL DATA:  Abdominal pain and vomiting EXAM: ULTRASOUND ABDOMEN LIMITED RIGHT UPPER QUADRANT COMPARISON:  October 18, 2017. FINDINGS: Gallbladder: Within the gallbladder, there is an echogenic focus which moves and shadows consistent with cholelithiasis. Gallstone measures 1.7 cm in length. There is no gallbladder wall thickening or pericholecystic fluid. No sonographic Murphy sign noted by sonographer. Common bile  duct: Diameter: 5 mm. No intrahepatic or extrahepatic biliary duct dilatation. Liver: Overall, the liver echotexture is increased diffusely. The somewhat echogenic area in the right lobe of the liver seen on previous ultrasound is less well apparent compared to the previous study, although there is subtle increased echogenicity in this area compared to the remainder of the liver. No other potential liver lesion evident. Portal vein is patent on color Doppler imaging with normal direction of blood flow towards the liver. IMPRESSION: 1. Cholelithiasis. No gallbladder wall thickening or pericholecystic fluid. 2. Evidence of hepatic steatosis. In the right lobe of the liver, there is subtle increased echogenicity, less apparent than on the previous study from 2018. Suspect geographic fatty infiltration in this area greater than in other  areas. Given concern for possible infiltrating lesion in this area, however, would advise correlation with nonemergent CT or MR of the liver, pre and serial post-contrast for further evaluation. Electronically Signed   By: Lowella Grip III M.D.   On: 06/18/2019 14:20    Assessment and Plan:   1. Preoperative evaluation: Patient with possible cholecystitis.  She certainly has cholelithiasis.  Plan for possible drain versus cholecystectomy.  She does have a history of coronary artery disease with stents in her circumflex and RCA.  Most recent stent to the RCA was placed 01/12/2019.  At this point, she has not had any further chest pain or shortness of breath.  She has minimal lower extremity edema and is not limited in doing activities because of her heart.  She would be at an intermediate risk for an intermediate risk procedure.  She does have a HIDA scan scheduled.  If there is a plan for a procedure, would be okay to hold Plavix.  She is greater than 3 months out from her most recent stent. 2. Coronary artery disease: Most recent stent placed February 2020 to the RCA.  We Rayden Scheper plan to continue aspirin and hold Plavix if surgical intervention is necessary. 3. Hypertension: Plan per primary team      For questions or updates, please contact [place] Please consult [email or web address] for contact info under     Signed, Shaurya Rawdon Meredith Leeds, MD  06/20/2019 11:26 AM

## 2019-06-20 NOTE — Plan of Care (Signed)

## 2019-06-20 NOTE — Consult Note (Signed)
Patient ID: [name] [name], female   DOB: October 08, 1981, 38 y.o.   MRN: [id number]  HPI [name] [name] is a 38 y.o. female with multiple comorbidities including diabetes, chronic kidney disease, congestive heart failure, coronary artery disease status post multiple stent placement, last one done on December 2019 currently on dual antiplatelet therapy to include aspirin and Plavix.  Comes in for increasing abdominal pain.  She reports that she has had intractable nausea and vomiting as well as the epigastric pain for several months now.  Initially she was told that it was due to gastroparesis.  She came in last time for increase in symptoms.  She reports that the pain is moderate to severe intensity located in the epigastric area is intermittent and sharp in nature.  No specific alleviating or aggravating factors.  She does have intractable nausea and vomiting.  No diarrhea. I have personally reviewed both CT and ultrasound.  There is evidence of cholelithiasis without cholecystitis.  No other acute intra-abdominal pathology other done fatty liver.  BMP is normal other than the creatinine of 1.25.  CBC is completely normal.  LFTs were normal as well.  HPI  Past Medical History:  Diagnosis Date  . CAD (coronary artery disease)    a. 03/2015 NSTEMI/PCI: LCX 1101m (DES), OM1 100p (DES - vessel labeled Ramus in subsequent cath report). Minor irregs to LAD/RCA; b. 04/2015 Cath: LM nl, LAD 40p, RI patent stent, LCX patent stent, RCA nl, EF 55-65%; c. 02/2016 MV: basal inferolateral and mid inferolateral defect/infarct w/o ischemia. EF 55-65%; d. 11/2018 PCI: LM 30ost, LAD 30p, LCX 95p/m (PTCA->20%), OM1 10ost, OM2 40ost, RCA 85p.  Marland Kitchen Chronic abdominal pain   . Chronic diastolic (congestive) heart failure ([place])    a. 03/2015 Echo: EF 30-35%, mild concentric LVH, severe HK of inf, inflat, & lat walls, mod MR, mild TR;  b. 04/2015 LV Gram: EF 55-65%; c. 09/2017 Echo: EF 55-60%, no rwma. Mild MR; d. 11/2018 Echo: Ef 60-65%,  no rwma.  . CKD (chronic kidney disease), stage III ([place])   . Diabetes type 2, controlled ([place])    a. since 2002   . Diabetic gastroparesis ([facility])    a. chronic nausea/vomiting.  . Diverticulosis, sigmoid 07/2016  . Heavy menses    a. H/O IUD - expired in 2014 - remains in place.  . Hyperlipidemia   . Hypertension   . Iron deficiency anemia   . Ischemic cardiomyopathy (resolved)    a. 03/2015 EF 30-35% post NSTEMI;  b. 04/2015 EF 55-65% on LV gram; c. 09/2017 Echo: EF 55-60%; d. 11/2018 Echo: Ef 60-65%.  . Obesity   . Tobacco abuse    a. quit 03/2015.  Marland Kitchen Uterine fibroid   . Vertigo   . Vitamin D deficiency     Past Surgical History:  Procedure Laterality Date  . APPENDECTOMY    . CARDIAC CATHETERIZATION  4/16   x2 stent [facility]  . CARDIAC CATHETERIZATION N/A 05/05/2015   Procedure: Left Heart Cath and Coronary Angiography;  Surgeon: Peter M Martinique, MD;  Location: [place] CV LAB;  Service: Cardiovascular;  Laterality: N/A;  . COLONOSCOPY WITH PROPOFOL N/A 07/31/2016   Procedure: COLONOSCOPY WITH PROPOFOL;  Surgeon: Lollie Sails, MD;  Location[facility] ENDOSCOPY;  Service: Endoscopy;  Laterality: N/A;  . CORONARY BALLOON ANGIOPLASTY N/A 12/04/2018   Procedure: CORONARY BALLOON ANGIOPLASTY;  Surgeon: Wellington Hampshire, MD;  Location: [place] CV LAB;  Service: Cardiovascular;  Laterality: N/A;  . CORONARY STENT INTERVENTION N/A 01/12/2019  Procedure: CORONARY STENT INTERVENTION;  Surgeon: [name], MD;  Location: [place] CV LAB;  Service: Cardiovascular;  Laterality: N/A;  . ESOPHAGOGASTRODUODENOSCOPY (EGD) WITH PROPOFOL N/A 07/31/2016   Procedure: ESOPHAGOGASTRODUODENOSCOPY (EGD) WITH PROPOFOL;  Surgeon: Lollie Sails, MD;  Location: [facility] ENDOSCOPY;  Service: Endoscopy;  Laterality: N/A;  . INTRAVASCULAR PRESSURE WIRE/FFR STUDY N/A 12/04/2018   Procedure: INTRAVASCULAR PRESSURE WIRE/FFR STUDY;  Surgeon: Wellington Hampshire, MD;  Location: [place] CV LAB;   Service: Cardiovascular;  Laterality: N/A;  . LAPAROSCOPIC APPENDECTOMY N/A 08/07/2016   Procedure: APPENDECTOMY LAPAROSCOPIC;  Surgeon: Hubbard Robinson, MD;  Location: [facility] ORS;  Service: General;  Laterality: N/A;  . LEFT HEART CATH AND CORONARY ANGIOGRAPHY Left 12/04/2018   Procedure: LEFT HEART CATH AND CORONARY ANGIOGRAPHY;  Surgeon: Wellington Hampshire, MD;  Location: [place] CV LAB;  Service: Cardiovascular;  Laterality: Left;  . LEFT HEART CATH AND CORONARY ANGIOGRAPHY N/A 01/12/2019   Procedure: LEFT HEART CATH AND CORONARY ANGIOGRAPHY;  Surgeon: [name], MD;  Location: [place] CV LAB;  Service: Cardiovascular;  Laterality: N/A;  . MOUTH SURGERY      Family History  Problem Relation Age of Onset  . Diabetes Father   . Cancer Maternal Grandmother        lung  . Cancer Maternal Grandfather        prostate  . Diabetes Paternal Grandfather   . Diabetes Paternal Grandmother   . Cancer - Cervical Maternal Aunt     Social History Social History   Tobacco Use  . Smoking status: Former Smoker    Years: 15.00    Quit date: 03/10/2015    Years since quitting: 4.2  . Smokeless tobacco: Never Used  Substance Use Topics  . Alcohol use: No    Alcohol/week: 0.0 standard drinks  . Drug use: No    Allergies  Allergen Reactions  . Lisinopril Anaphylaxis, Itching and Swelling  . Rosemary Oil Anaphylaxis  . Shellfish Allergy Itching and Swelling  . Other Itching and Swelling    [place] seasoning  . Metformin And Related Diarrhea, Nausea And Vomiting and Rash    Current Facility-Administered Medications  Medication Dose Route Frequency Provider Last Rate Last Dose  . 0.9 %  sodium chloride infusion   Intravenous Continuous Loletha Grayer, MD 50 mL/hr at 06/20/19 0343    . acetaminophen (TYLENOL) tablet 650 mg  650 mg Oral Q6H PRN Loletha Grayer, MD       Or  . acetaminophen (TYLENOL) suppository 650 mg  650 mg Rectal Q6H PRN Wieting, Richard, MD      .  amitriptyline (ELAVIL) tablet 25 mg  25 mg Oral QHS Loletha Grayer, MD   25 mg at 06/19/19 2049  . cholecalciferol (VITAMIN D3) tablet 2,000 Units  2,000 Units Oral Daily Loletha Grayer, MD   2,000 Units at 06/19/19 1043  . ciprofloxacin (CIPRO) IVPB 400 mg  400 mg Intravenous BID Saundra Shelling, MD   Stopped at 06/19/19 2148  . clopidogrel (PLAVIX) tablet 75 mg  75 mg Oral QHS Loletha Grayer, MD   75 mg at 06/19/19 2050  . DULoxetine (CYMBALTA) DR capsule 60 mg  60 mg Oral Daily Loletha Grayer, MD   60 mg at 06/19/19 1043  . enoxaparin (LOVENOX) injection 40 mg  40 mg Subcutaneous Q12H Rowland Lathe, RPH   40 mg at 06/20/19 0027  . insulin aspart (novoLOG) injection 0-5 Units  0-5 Units Subcutaneous QHS Lance Coon, MD      .  insulin aspart (novoLOG) injection 0-9 Units  0-9 Units Subcutaneous TID WC Lance Coon, MD   2 Units at 06/19/19 1630  . insulin glargine (LANTUS) injection 30 Units  30 Units Subcutaneous Daily Loletha Grayer, MD   30 Units at 06/19/19 1042  . irbesartan (AVAPRO) tablet 75 mg  75 mg Oral Daily Loletha Grayer, MD   75 mg at 06/19/19 1043  . isosorbide mononitrate (IMDUR) 24 hr tablet 60 mg  60 mg Oral Daily Loletha Grayer, MD   60 mg at 06/19/19 1043  . labetalol (NORMODYNE) injection 10 mg  10 mg Intravenous Q2H PRN Pyreddy, Reatha Harps, MD      . meclizine (ANTIVERT) tablet 25 mg  25 mg Oral TID PRN Loletha Grayer, MD      . metoCLOPramide (REGLAN) injection 5 mg  5 mg Intravenous Q6H Loletha Grayer, MD   5 mg at 06/20/19 0616  . metoprolol tartrate (LOPRESSOR) tablet 100 mg  100 mg Oral BID Loletha Grayer, MD   100 mg at 06/19/19 2050  . mirtazapine (REMERON) tablet 60 mg  60 mg Oral QHS Loletha Grayer, MD   60 mg at 06/19/19 2050  . morphine 2 MG/ML injection 2 mg  2 mg Intravenous Q4H PRN Loletha Grayer, MD   2 mg at 06/20/19 0447  . nitroGLYCERIN (NITROSTAT) SL tablet 0.4 mg  0.4 mg Sublingual Q5 min PRN Wieting, Richard, MD      . ondansetron  [facility]) tablet 4 mg  4 mg Oral Q6H PRN Wieting, Richard, MD       Or  . ondansetron [facility]) injection 4 mg  4 mg Intravenous Q6H PRN Loletha Grayer, MD   4 mg at 06/19/19 1939  . oxyCODONE (Oxy IR/ROXICODONE) immediate release tablet 5 mg  5 mg Oral Q4H PRN Loletha Grayer, MD   5 mg at 06/19/19 0646  . pantoprazole (PROTONIX) EC tablet 40 mg  40 mg Oral BID Loletha Grayer, MD   40 mg at 06/19/19 2051  . polyethylene glycol (MIRALAX / GLYCOLAX) packet 17 g  17 g Oral Daily PRN Loletha Grayer, MD      . prochlorperazine (COMPAZINE) injection 10 mg  10 mg Intravenous Q6H PRN Pabon, Diego F, MD         Review of Systems Full ROS  was asked and was negative except for the information on the HPI  Physical Exam Blood pressure (!) 155/91, pulse 77, temperature 98 F (36.7 C), temperature source Oral, resp. rate 15, height 5\' 5"  (1.651 m), weight 119.3 kg, SpO2 98 %. CONSTITUTIONAL: NAD EYES: Pupils are equal, round, and reactive to light, Sclera are non-icteric. EARS, NOSE, MOUTH AND THROAT: The oropharynx is clear. The oral mucosa is pink and moist. Hearing is intact to voice. LYMPH NODES:  Lymph nodes in the neck are normal. RESPIRATORY:  Lungs are clear. There is normal respiratory effort, with equal breath sounds bilaterally, and without pathologic use of accessory muscles. CARDIOVASCULAR: Heart is regular without murmurs, gallops, or rubs. GI: The abdomen is  soft, Mild tenderness epigastric area, no peritonitis , no Murphy. There are no palpable masses. There is no hepatosplenomegaly. There are normal bowel sounds in all quadrants. GU: Rectal deferred.   MUSCULOSKELETAL: Normal muscle strength and tone. No cyanosis or edema.   SKIN: Turgor is good and there are no pathologic skin lesions or ulcers. NEUROLOGIC: Motor and sensation is grossly normal. Cranial nerves are grossly intact. PSYCH:  Oriented to person, place and time. Affect is normal.  Data Reviewed  I have personally  reviewed the patient's imaging, laboratory findings and medical records.    Assessment/Plan 38 year old female with multiple comorbidities including significant coronary artery disease status post stent placement 8 months ago now presents with intractable nausea vomiting and epigastric pain. Imaging studies not definitive for acute cholecystitis.  Since the patient continues to have symptoms I will order a HIDA scan to definitively rule out acute cholecystitis.  If in fact the cystic duct is blocked she will require a cholecystostomy tube.  Given that she is on dual antiplatelet therapy we will have to delay laparoscopic cholecystectomy at least until she is 5 to 7 days off the Plavix.  This poses a challenging dilemma from a cardiac perspective because the incidence of restenosis after his stent is significant within the first year.  I do think that it might be prudent to ask cardiology for their thoughts about potentially holding the antiplatelet therapy for a few days. ( Doubt that they will recommend withholding plavix given significant CAD) Discussed with Dr.Pyreddy in detail.  No need for any emergent surgical intervention at this time. I do think that she will benefit from GI evaluation as well.  Apparently she does have a GI doctor at [facility].     Caroleen Hamman, MD FACS General Surgeon 06/20/2019, 8:37 AM

## 2019-06-20 NOTE — Progress Notes (Addendum)
[facility] [place] NAME: Jene Huq    MR#:  [id number]  DATE OF BIRTH:  [birth year]  SUBJECTIVE:  CHIEF COMPLAINT:   Chief Complaint  Patient presents with  . Abdominal Pain  Patient seen and evaluated today Has abdominal pain Has nausea No blurry vision or any weakness or numbness No complaints of chest pain No shortness of breath  REVIEW OF SYSTEMS:    ROS  CONSTITUTIONAL: No documented fever. Has fatigue, weakness. No weight gain, no weight loss.  EYES: No blurry or double vision.  ENT: No tinnitus. No postnasal drip. No redness of the oropharynx.  RESPIRATORY: No cough, no wheeze, no hemoptysis. No dyspnea.  CARDIOVASCULAR: No chest pain. No orthopnea. No palpitations. No syncope.  GASTROINTESTINAL: Has nausea, no vomiting or diarrhea. Has abdominal pain. No melena or hematochezia.  GENITOURINARY: No dysuria or hematuria.  ENDOCRINE: No polyuria or nocturia. No heat or cold intolerance.  HEMATOLOGY: No anemia. No bruising. No bleeding.  INTEGUMENTARY: No rashes. No lesions.  MUSCULOSKELETAL: No arthritis. No swelling. No gout.  NEUROLOGIC: No numbness, tingling, or ataxia. No seizure-type activity.  PSYCHIATRIC: No anxiety. No insomnia. No ADD.   DRUG ALLERGIES:   Allergies  Allergen Reactions  . Lisinopril Anaphylaxis, Itching and Swelling  . Rosemary Oil Anaphylaxis  . Shellfish Allergy Itching and Swelling  . Other Itching and Swelling    [place] seasoning  . Metformin And Related Diarrhea, Nausea And Vomiting and Rash    VITALS:  Blood pressure (!) 155/91, pulse 77, temperature 98 F (36.7 C), temperature source Oral, resp. rate 15, height 5\' 5"  (1.651 m), weight 119.3 kg, SpO2 98 %.  PHYSICAL EXAMINATION:   Physical Exam  GENERAL:  38 y.o.-year-old patient lying in the bed with no acute distress.  EYES: Pupils equal, round, reactive to light and accommodation. No scleral icterus. Extraocular muscles  intact.  HEENT: Head atraumatic, normocephalic. Oropharynx and nasopharynx clear.  NECK:  Supple, no jugular venous distention. No thyroid enlargement, no tenderness.  LUNGS: Normal breath sounds bilaterally, no wheezing, rales, rhonchi. No use of accessory muscles of respiration.  CARDIOVASCULAR: S1, S2 normal. No murmurs, rubs, or gallops.  ABDOMEN: Soft, tenderness right upper quadrant, nondistended. Bowel sounds present. No organomegaly or mass.  EXTREMITIES: No cyanosis, clubbing or edema b/l.    NEUROLOGIC: Cranial nerves II through XII are intact. No focal Motor or sensory deficits b/l.   PSYCHIATRIC: The patient is alert and oriented x 3.  SKIN: No obvious rash, lesion, or ulcer.   LABORATORY PANEL:   CBC Recent Labs  Lab 06/20/19 0603  WBC 9.5  HGB 12.7  HCT 40.6  PLT 459*   ------------------------------------------------------------------------------------------------------------------ Chemistries  Recent Labs  Lab 06/18/19 0740  06/20/19 0603  NA 141   < > 138  K 3.8   < > 3.9  CL 107   < > 106  CO2 21*   < > 24  GLUCOSE 260*   < > 145*  BUN 19   < > 15  CREATININE 1.42*   < > 1.25*  CALCIUM 9.4   < > 8.5*  AST 20  --   --   ALT 17  --   --   ALKPHOS 101  --   --   BILITOT 0.5  --   --    < > = values in this interval not displayed.   ------------------------------------------------------------------------------------------------------------------  Cardiac Enzymes No results for input(s): TROPONINI in the  last 168 hours. ------------------------------------------------------------------------------------------------------------------  RADIOLOGY:  Ct Abdomen Pelvis W Contrast  Result Date: 06/19/2019 CLINICAL DATA:  Right upper quadrant pain EXAM: CT ABDOMEN AND PELVIS WITH CONTRAST TECHNIQUE: Multidetector CT imaging of the abdomen and pelvis was performed using the standard protocol following bolus administration of intravenous contrast. CONTRAST:   130mL OMNIPAQUE IOHEXOL 300 MG/ML  SOLN COMPARISON:  Ultrasound from the previous day, CT from 04/16/2019 FINDINGS: Lower chest: No acute abnormality. Hepatobiliary: Fatty infiltration of the liver is noted. The known gallstone is not well appreciated within the gallbladder. No focal mass is identified. Previously described abnormality within the right lobe of the liver isn't appreciated on this exam and was not seen on prior MRI from 11/04/2017. Pancreas: Unremarkable. No pancreatic ductal dilatation or surrounding inflammatory changes. Spleen: Normal in size without focal abnormality. Adrenals/Urinary Tract: Adrenal glands are within normal limits. Kidneys demonstrate a normal enhancement pattern bilaterally. No obstructive changes are noted. The ureters are within normal limits bilaterally. The bladder is partially distended. Stomach/Bowel: The appendix has been surgically removed. No obstructive or inflammatory changes of the larger small-bowel are seen. The stomach is within normal limits. Vascular/Lymphatic: Aortic atherosclerosis. No enlarged abdominal or pelvic lymph nodes. Note is made of retroaortic left renal vein. Reproductive: Uterus and bilateral adnexa are unremarkable. Other: No abdominal wall hernia or abnormality. No abdominopelvic ascites. Musculoskeletal: No acute or significant osseous findings. IMPRESSION: Fatty infiltration of the liver without focal mass. No acute abnormality noted. Electronically Signed   By: Inez Catalina M.D.   On: 06/19/2019 18:01   US Abdomen Limited Ruq  Result Date: 06/18/2019 CLINICAL DATA:  Abdominal pain and vomiting EXAM: ULTRASOUND ABDOMEN LIMITED RIGHT UPPER QUADRANT COMPARISON:  October 18, 2017. FINDINGS: Gallbladder: Within the gallbladder, there is an echogenic focus which moves and shadows consistent with cholelithiasis. Gallstone measures 1.7 cm in length. There is no gallbladder wall thickening or pericholecystic fluid. No sonographic Murphy sign noted  by sonographer. Common bile duct: Diameter: 5 mm. No intrahepatic or extrahepatic biliary duct dilatation. Liver: Overall, the liver echotexture is increased diffusely. The somewhat echogenic area in the right lobe of the liver seen on previous ultrasound is less well apparent compared to the previous study, although there is subtle increased echogenicity in this area compared to the remainder of the liver. No other potential liver lesion evident. Portal vein is patent on color Doppler imaging with normal direction of blood flow towards the liver. IMPRESSION: 1. Cholelithiasis. No gallbladder wall thickening or pericholecystic fluid. 2. Evidence of hepatic steatosis. In the right lobe of the liver, there is subtle increased echogenicity, less apparent than on the previous study from 2018. Suspect geographic fatty infiltration in this area greater than in other areas. Given concern for possible infiltrating lesion in this area, however, would advise correlation with nonemergent CT or MR of the liver, pre and serial post-contrast for further evaluation. Electronically Signed   By: Lowella Grip III M.D.   On: 06/18/2019 14:20     ASSESSMENT AND PLAN:   38 year old female patient with history of diabetes mellitus type 2, coronary disease, cardiac stent, chronic diastolic heart failure with EF of 30 to 35%, diabetic gastroparesis, hyperlipidemia, hypertension, cardiomyopathy currently under hospitalist service.  -Abdominal pain Cholelithiasis Cholecystectomy versus cholecystostomy drain HIDA scan in the afternoon Status post surgery evaluation CT abdomen reviewed and abdominal ultrasound reviewed Currently n.p.o. IV ciprofloxacin antibiotic started as per surgery recommendation  -Intractable nausea vomiting Gastroparesis IV fluid hydration Antiemetics Trial of Compazine Consult gastroenterology  -  Accelerated hypertension Control blood pressure with oral hypertensives and PRN IV hypertensive  medication  -Diabetes mellitus type 2 We will hold off on Lantus insulin as patient is n.p.o. Continue sliding scale coverage with insulin  -Coronary artery disease with history of cardiac stent Hold aspirin and Plavix for now Continue metoprolol, nitrate and ARB Status post cardiology evaluation Can hold Plavix if procedure needed  -CKD stage III Monitor renal function Avoid nephrotoxic medication  -Chronic diastolic heart failure Does not appear to be in volume overload Medical management Appears stable  All the records are reviewed and case discussed with Care Management/Social Worker. Management plans discussed with the patient, family and they are in agreement.  CODE STATUS: Full code  DVT Prophylaxis: SCDs  TOTAL TIME TAKING CARE OF THIS PATIENT: 38 minutes.   POSSIBLE D/C IN 2 to 3 DAYS, DEPENDING ON CLINICAL CONDITION.  Saundra Shelling M.D on 06/20/2019 at 1:18 PM  Between 7am to 6pm - Pager - [phone]  After 6pm go to [email or web address] - password [email or web address]  Office  [phone]  CC: Primary care physician; Venita Lick, NP  Note: This dictation was prepared with Dragon dictation along with smaller phrase technology. Any transcriptional errors that result from this process are unintentional.

## 2019-06-21 DIAGNOSIS — R112 Nausea with vomiting, unspecified: Secondary | ICD-10-CM

## 2019-06-21 DIAGNOSIS — K8020 Calculus of gallbladder without cholecystitis without obstruction: Secondary | ICD-10-CM

## 2019-06-21 DIAGNOSIS — K3184 Gastroparesis: Secondary | ICD-10-CM

## 2019-06-21 DIAGNOSIS — E1143 Type 2 diabetes mellitus with diabetic autonomic (poly)neuropathy: Principal | ICD-10-CM

## 2019-06-21 LAB — COMPREHENSIVE METABOLIC PANEL
ALT: 10 U/L (ref 0–44)
AST: 13 U/L — ABNORMAL LOW (ref 15–41)
Albumin: 2.5 g/dL — ABNORMAL LOW (ref 3.5–5.0)
Alkaline Phosphatase: 73 U/L (ref 38–126)
Anion gap: 8 (ref 5–15)
BUN: 20 mg/dL (ref 6–20)
CO2: 24 mmol/L (ref 22–32)
Calcium: 7.9 mg/dL — ABNORMAL LOW (ref 8.9–10.3)
Chloride: 106 mmol/L (ref 98–111)
Creatinine, Ser: 1.84 mg/dL — ABNORMAL HIGH (ref 0.44–1.00)
GFR calc Af Amer: 39 mL/min — ABNORMAL LOW (ref >60)
GFR calc non Af Amer: 34 mL/min — ABNORMAL LOW (ref >60)
Glucose, Bld: 120 mg/dL — ABNORMAL HIGH (ref 70–99)
Potassium: 3.8 mmol/L (ref 3.5–5.1)
Sodium: 138 mmol/L (ref 135–145)
Total Bilirubin: 0.4 mg/dL (ref 0.3–1.2)
Total Protein: 5.9 g/dL — ABNORMAL LOW (ref 6.5–8.1)

## 2019-06-21 LAB — CBC
HCT: 37.8 % (ref 36.0–46.0)
Hemoglobin: 11.9 g/dL — ABNORMAL LOW (ref 12.0–15.0)
MCH: 26.1 pg (ref 26.0–34.0)
MCHC: 31.5 g/dL (ref 30.0–36.0)
MCV: 82.9 fL (ref 80.0–100.0)
Platelets: 404 10*3/uL — ABNORMAL HIGH (ref 150–400)
RBC: 4.56 MIL/uL (ref 3.87–5.11)
RDW: 13.4 % (ref 11.5–15.5)
WBC: 8.5 10*3/uL (ref 4.0–10.5)
nRBC: 0 % (ref 0.0–0.2)

## 2019-06-21 LAB — GLUCOSE, CAPILLARY
Glucose-Capillary: 104 mg/dL — ABNORMAL HIGH (ref 70–99)
Glucose-Capillary: 128 mg/dL — ABNORMAL HIGH (ref 70–99)
Glucose-Capillary: 94 mg/dL (ref 70–99)
Glucose-Capillary: 94 mg/dL (ref 70–99)

## 2019-06-21 LAB — MRSA PCR SCREENING: MRSA by PCR: POSITIVE — AB

## 2019-06-21 MED ORDER — POLYETHYLENE GLYCOL 3350 17 G PO PACK
17.0000 g | PACK | Freq: Two times a day (BID) | ORAL | Status: DC
  Administered 2019-06-21 – 2019-06-22 (×2): 17 g via ORAL
  Filled 2019-06-21 (×2): qty 1

## 2019-06-21 MED ORDER — CIPROFLOXACIN HCL 500 MG PO TABS
500.0000 mg | ORAL_TABLET | Freq: Two times a day (BID) | ORAL | Status: DC
  Administered 2019-06-21 – 2019-06-22 (×2): 500 mg via ORAL
  Filled 2019-06-21 (×3): qty 1

## 2019-06-21 MED ORDER — CHLORHEXIDINE GLUCONATE CLOTH 2 % EX PADS: 6.0000 | MEDICATED_PAD | Freq: Every day | CUTANEOUS | Status: DC

## 2019-06-21 MED ORDER — ERYTHROMYCIN BASE 250 MG PO TABS
250.0000 mg | ORAL_TABLET | Freq: Three times a day (TID) | ORAL | Status: DC
  Administered 2019-06-21 – 2019-06-22 (×3): 250 mg via ORAL
  Filled 2019-06-21 (×5): qty 1

## 2019-06-21 MED ORDER — MUPIROCIN 2 % EX OINT
1.0000 "application " | TOPICAL_OINTMENT | Freq: Two times a day (BID) | CUTANEOUS | Status: DC
  Administered 2019-06-21 – 2019-06-22 (×3): 1 via NASAL
  Filled 2019-06-21: qty 22

## 2019-06-21 MED ORDER — ASPIRIN 81 MG PO CHEW
81.0000 mg | CHEWABLE_TABLET | Freq: Every day | ORAL | Status: DC
  Administered 2019-06-21 – 2019-06-22 (×2): 81 mg via ORAL
  Filled 2019-06-21 (×2): qty 1

## 2019-06-21 NOTE — Plan of Care (Signed)

## 2019-06-21 NOTE — Care Management Important Message (Signed)
Important Message  Patient Details  Name: [name] [name] MRN: [id number] Date of Birth: 1981-10-22   Medicare Important Message Given:  Yes  Initial Medicare IM given by Patient Access Associate on 06/21/2019 at 1435.  Still valid.   [name] 06/21/2019, 4:18 PM

## 2019-06-21 NOTE — Progress Notes (Signed)
Progress Note  Patient Name: [name] [name] Date of Encounter: 06/21/2019  Primary Cardiologist: Kathlyn Sacramento, MD   Subjective   No chest pain or shortness of breath.  Nausea and vomiting improved.  Inpatient Medications    Scheduled Meds:  amitriptyline  25 mg Oral QHS   aspirin  81 mg Oral Daily   Chlorhexidine Gluconate Cloth  6 each Topical Q0600   cholecalciferol  2,000 Units Oral Daily   DULoxetine  60 mg Oral Daily   enoxaparin (LOVENOX) injection  40 mg Subcutaneous Q12H   insulin aspart  0-5 Units Subcutaneous QHS   insulin aspart  0-9 Units Subcutaneous TID WC   irbesartan  75 mg Oral Daily   isosorbide mononitrate  60 mg Oral Daily   metoCLOPramide (REGLAN) injection  5 mg Intravenous Q6H   metoprolol tartrate  100 mg Oral BID   mirtazapine  60 mg Oral QHS   mupirocin ointment  1 application Nasal BID   pantoprazole  40 mg Oral BID   Continuous Infusions:  sodium chloride 50 mL/hr at 06/21/19 0250   ciprofloxacin 400 mg (06/20/19 2027)   PRN Meds: acetaminophen **OR** acetaminophen, alum & mag hydroxide-simeth, labetalol, meclizine, morphine injection, nitroGLYCERIN, ondansetron **OR** ondansetron (ZOFRAN) IV, oxyCODONE, polyethylene glycol, prochlorperazine   Vital Signs    Vitals:   06/20/19 0802 06/20/19 1920 06/21/19 0517 06/21/19 0714  BP: (!) 155/91 (!) 182/93 (!) 149/79 117/72  Pulse: 77 (!) 101 78 72  Resp: 15 14 16 13   Temp: 98 F (36.7 C) 98.8 F (37.1 C) 98 F (36.7 C) 98.7 F (37.1 C)  TempSrc: Oral Oral Oral Oral  SpO2: 98% 96% 92% 99%  Weight:      Height:        Intake/Output Summary (Last 24 hours) at 06/21/2019 0913 Last data filed at 06/21/2019 0005 Gross per 24 hour  Intake --  Output 0 ml  Net 0 ml   Last 3 Weights 06/20/2019 06/18/2019 06/18/2019  Weight (lbs) 263 lb 260 lb 9.6 oz 270 lb  Weight (kg) 119.296 kg 118.207 kg 122.471 kg      Telemetry    Normal sinus rhythm- Personally Reviewed  ECG     Sinus tachycardia with low voltage and left atrial enlargement.- Personally Reviewed  Physical Exam   GEN: No acute distress.   Neck: No JVD Cardiac: RRR, no murmurs, rubs, or gallops.  Respiratory: Clear to auscultation bilaterally. GI: Soft, nontender, non-distended  MS: No edema; No deformity. Neuro:  Nonfocal  Psych: Normal affect   Labs    High Sensitivity Troponin:  No results for input(s): TROPONINIHS in the last 720 hours.    Cardiac EnzymesNo results for input(s): TROPONINI in the last 168 hours. No results for input(s): TROPIPOC in the last 168 hours.   Chemistry Recent Labs  Lab 06/18/19 0740 06/19/19 0643 06/20/19 0603 06/21/19 0536  NA 141 141 138 138  K 3.8 3.9 3.9 3.8  CL 107 106 106 106  CO2 21* 23 24 24   GLUCOSE 260* 160* 145* 120*  BUN 19 20 15 20   CREATININE 1.42* 1.54* 1.25* 1.84*  CALCIUM 9.4 9.0 8.5* 7.9*  PROT 7.6  --   --  5.9*  ALBUMIN 3.5  --   --  2.5*  AST 20  --   --  13*  ALT 17  --   --  10  ALKPHOS 101  --   --  73  BILITOT 0.5  --   --  0.4  GFRNONAA 46* 42* 54* 34*  GFRAA 54* 49* >60 39*  ANIONGAP 13 12 8 8      Hematology Recent Labs  Lab 06/19/19 0643 06/20/19 0603 06/21/19 0536  WBC 13.1* 9.5 8.5  RBC 4.77 4.86 4.56  HGB 12.4 12.7 11.9*  HCT 39.6 40.6 37.8  MCV 83.0 83.5 82.9  MCH 26.0 26.1 26.1  MCHC 31.3 31.3 31.5  RDW 13.8 13.2 13.4  PLT 475* 459* 404*    BNPNo results for input(s): BNP, PROBNP in the last 168 hours.   DDimer No results for input(s): DDIMER in the last 168 hours.   Radiology    Nm Hepatobiliary Liver Func  Result Date: 06/20/2019 CLINICAL DATA:  Evaluate for cholecystitis.  Pain. EXAM: NUCLEAR MEDICINE HEPATOBILIARY IMAGING TECHNIQUE: Sequential images of the abdomen were obtained out to 60 minutes following intravenous administration of radiopharmaceutical. RADIOPHARMACEUTICALS:  5.45 mCi Tc-32m  Choletec IV COMPARISON:  None. FINDINGS: There is prompt uptake of radiotracer in the liver  and normal filling of the gallbladder within the first hour of imaging. There is no excretion from the gallbladder in till nearly 3 hours after administration of the radiopharmaceutical. No other abnormalities are identified. IMPRESSION: 1. There is prompt uptake in the liver and prompt filling of the gallbladder excluding cystic duct obstruction. 2. There is relative delayed excretion of activity into the bowel of uncertain etiology. However, the rapid filling of the gallbladder and the eventual excretion of activity into the bowel precludes a high-grade common bile duct obstruction. A CT scan from yesterday and an ultrasound from June 18, 2019 demonstrated no common bile duct dilatation. Electronically Signed   By: Dorise Bullion III M.D   On: 06/20/2019 18:16   Ct Abdomen Pelvis W Contrast  Result Date: 06/19/2019 CLINICAL DATA:  Right upper quadrant pain EXAM: CT ABDOMEN AND PELVIS WITH CONTRAST TECHNIQUE: Multidetector CT imaging of the abdomen and pelvis was performed using the standard protocol following bolus administration of intravenous contrast. CONTRAST:  138mL OMNIPAQUE IOHEXOL 300 MG/ML  SOLN COMPARISON:  Ultrasound from the previous day, CT from 04/16/2019 FINDINGS: Lower chest: No acute abnormality. Hepatobiliary: Fatty infiltration of the liver is noted. The known gallstone is not well appreciated within the gallbladder. No focal mass is identified. Previously described abnormality within the right lobe of the liver isn't appreciated on this exam and was not seen on prior MRI from 11/04/2017. Pancreas: Unremarkable. No pancreatic ductal dilatation or surrounding inflammatory changes. Spleen: Normal in size without focal abnormality. Adrenals/Urinary Tract: Adrenal glands are within normal limits. Kidneys demonstrate a normal enhancement pattern bilaterally. No obstructive changes are noted. The ureters are within normal limits bilaterally. The bladder is partially distended. Stomach/Bowel: The  appendix has been surgically removed. No obstructive or inflammatory changes of the larger small-bowel are seen. The stomach is within normal limits. Vascular/Lymphatic: Aortic atherosclerosis. No enlarged abdominal or pelvic lymph nodes. Note is made of retroaortic left renal vein. Reproductive: Uterus and bilateral adnexa are unremarkable. Other: No abdominal wall hernia or abnormality. No abdominopelvic ascites. Musculoskeletal: No acute or significant osseous findings. IMPRESSION: Fatty infiltration of the liver without focal mass. No acute abnormality noted. Electronically Signed   By: Inez Catalina M.D.   On: 06/19/2019 18:01    Cardiac Studies     Patient Profile     38 y.o. female with known history of coronary artery disease status post left circumflex and RCA stenting most recently in February 2020, uncontrolled diabetes, essential hypertension and chronic kidney  disease who presented with intractable nausea and vomiting with suspected cholecystitis.  Assessment & Plan    1.  Preop cardiovascular evaluation for possible cholecystectomy: I agree that the patient should be considered at a moderate risk from a cardiac standpoint. Most recent drug-eluting stent placement was in February 2020.  She is almost 6 months out and thus it is reasonable to hold Plavix for now given that the risk stent thrombosis at this time is relatively low.  Plavix should be resumed post surgery once no bleeding issues. Low-dose aspirin 81 mg once daily should be continued.  2.  Coronary artery disease involving native coronary arteries: Currently without angina.  3.  Hyperlipidemia: Intolerance to statins.  The plan is to start the patient on a PCSK9 inhibitor as an outpatient in the near future.       For questions or updates, please contact [place] Please consult [email or web address] for contact info under        Signed, Kathlyn Sacramento, MD  06/21/2019, 9:13 AM

## 2019-06-21 NOTE — TOC Initial Note (Signed)
Transition of Care [facility]) - Initial/Assessment Note    Patient Details  Name: [name] [name] MRN: [id number] Date of Birth: 17-Mar-1981  [facility] CM/SW Contact:    Elza Rafter, RN Phone Number: 06/21/2019, 10:01 AM  Clinical Narrative:      Patient is independent  from home with husband, children and her mother.   CM consult placed for high readmission risk screen.  Patient is current with PCP; obtains mediations at [facility] on [address] without difficulty.  No housing or transportation issues.  Her mother will transport her home at discharge.  Follow up PCP appointment will be scheduled prior to hospital discharge.  No further needs identified.            Expected Discharge Plan: Home/Self Care Barriers to Discharge: Continued Medical Work up   Patient Goals and CMS Choice Patient states their goals for this hospitalization and ongoing recovery are:: discharge to home with husband      Expected Discharge Plan and Services Expected Discharge Plan: Home/Self Care       Living arrangements for the past 2 months: Single Family Home                                      Prior Living Arrangements/Services Living arrangements for the past 2 months: Single Family Home Lives with:: Spouse, Minor Children, Parents   Do you feel safe going back to the place where you live?: Yes      Need for Family Participation in Patient Care: No (Comment) Care giver support system in place?: Yes (comment)   Criminal Activity/Legal Involvement Pertinent to Current Situation/Hospitalization: No - Comment as needed  Activities of Daily Living Home Assistive Devices/Equipment: None ADL Screening (condition at time of admission) Patient's cognitive ability adequate to safely complete daily activities?: Yes Is the patient deaf or have difficulty hearing?: No Does the patient have difficulty seeing, even when wearing glasses/contacts?: No Does the patient have difficulty  concentrating, remembering, or making decisions?: No Patient able to express need for assistance with ADLs?: Yes Does the patient have difficulty dressing or bathing?: No Independently performs ADLs?: Yes (appropriate for developmental age) Does the patient have difficulty walking or climbing stairs?: No Weakness of Legs: Both Weakness of Arms/Hands: None  Permission Sought/Granted                  Emotional Assessment Appearance:: Appears stated age Attitude/Demeanor/Rapport: Gracious Affect (typically observed): Accepting Orientation: : Oriented to Self, Oriented to Place, Oriented to  Time, Oriented to Situation Alcohol / Substance Use: Not Applicable Psych Involvement: No (comment)  Admission diagnosis:  Intractable nausea and vomiting [R11.2] Abdominal pain [R10.9] Patient Active Problem List   Diagnosis Date Noted  . Preop cardiovascular exam   . Diabetic gastroparesis ([place]) 06/18/2019  . Anemia 01/14/2019  . Stable angina ([place]) 01/12/2019  . Thrombocytosis ([place]) 01/05/2019  . Coronary artery disease of native artery of native heart with stable angina pectoris ([place]) 12/17/2018  . Unstable angina ([place]) 12/04/2018  . Diabetic gastroparesis associated with type 2 diabetes mellitus ([place]) 05/02/2018  . CKD stage 3 due to type 2 diabetes mellitus ([place]) 04/06/2018  . Poorly controlled type 2 diabetes mellitus ([place]) 08/27/2017  . Lung nodule < 6cm on CT 08/12/2017  . Depression, major, single episode, severe ([place]) 08/12/2017  . Chronic abdominal pain 08/03/2017  . Obstructive sleep apnea 05/16/2017  .  Neuropathy 01/17/2017  . Vitamin D deficiency 10/25/2016  . Insomnia 10/25/2016  . 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 Elevated liver function tests 06/05/2016  . Chronic diastolic heart failure ([place]) 03/19/2016  . Proteinuria 08/30/2015  . CAD in native artery   . Angina pectoris ([place])   . Iron deficiency anemia   .  Hyperlipidemia associated with type 2 diabetes mellitus ([place]) 04/13/2015  . Ischemic cardiomyopathy   . Obesity    PCP:  Venita Lick, NP Pharmacy:   [facility] [address], [place] - [place] [address] [place] [address] Phone: [phone] Fax: [phone]  [facility] - [place], [place] [facility] [phone] [place] [address] Phone: [phone] Fax: [phone]     Social Determinants of Health (SDOH) Interventions    Readmission Risk Interventions No flowsheet data found.

## 2019-06-21 NOTE — Progress Notes (Signed)
[facility] NAME: Shanyah Gattuso    MR#:  [id number]  DATE OF BIRTH:  [birth year]  SUBJECTIVE:  CHIEF COMPLAINT:   Chief Complaint  Patient presents with  . Abdominal Pain  Patient seen and evaluated today Has decreased abdominal pain Has nausea but no vomiting No blurry vision or any weakness or numbness No complaints of chest pain No shortness of breath  REVIEW OF SYSTEMS:    ROS  CONSTITUTIONAL: No documented fever. Has fatigue, weakness. No weight gain, no weight loss.  EYES: No blurry or double vision.  ENT: No tinnitus. No postnasal drip. No redness of the oropharynx.  RESPIRATORY: No cough, no wheeze, no hemoptysis. No dyspnea.  CARDIOVASCULAR: No chest pain. No orthopnea. No palpitations. No syncope.  GASTROINTESTINAL: Has nausea, no vomiting or diarrhea. Has abdominal pain. No melena or hematochezia.  GENITOURINARY: No dysuria or hematuria.  ENDOCRINE: No polyuria or nocturia. No heat or cold intolerance.  HEMATOLOGY: No anemia. No bruising. No bleeding.  INTEGUMENTARY: No rashes. No lesions.  MUSCULOSKELETAL: No arthritis. No swelling. No gout.  NEUROLOGIC: No numbness, tingling, or ataxia. No seizure-type activity.  PSYCHIATRIC: No anxiety. No insomnia. No ADD.   DRUG ALLERGIES:   Allergies  Allergen Reactions  . Lisinopril Anaphylaxis, Itching and Swelling  . Rosemary Oil Anaphylaxis  . Shellfish Allergy Itching and Swelling  . Other Itching and Swelling    [place] seasoning  . Metformin And Related Diarrhea, Nausea And Vomiting and Rash    VITALS:  Blood pressure 117/72, pulse 72, temperature 98.7 F (37.1 C), temperature source Oral, resp. rate 13, height 5\' 5"  (1.651 m), weight 119.3 kg, SpO2 99 %.  PHYSICAL EXAMINATION:   Physical Exam  GENERAL:  38 y.o.-year-old patient lying in the bed with no acute distress.  EYES: Pupils equal, round, reactive to light and accommodation. No scleral icterus.  Extraocular muscles intact.  HEENT: Head atraumatic, normocephalic. Oropharynx and nasopharynx clear.  NECK:  Supple, no jugular venous distention. No thyroid enlargement, no tenderness.  LUNGS: Normal breath sounds bilaterally, no wheezing, rales, rhonchi. No use of accessory muscles of respiration.  CARDIOVASCULAR: S1, S2 normal. No murmurs, rubs, or gallops.  ABDOMEN: Soft, tenderness right upper quadrant, nondistended. Bowel sounds present. No organomegaly or mass.  EXTREMITIES: No cyanosis, clubbing or edema b/l.    NEUROLOGIC: Cranial nerves II through XII are intact. No focal Motor or sensory deficits b/l.   PSYCHIATRIC: The patient is alert and oriented x 3.  SKIN: No obvious rash, lesion, or ulcer.   LABORATORY PANEL:   CBC Recent Labs  Lab 06/21/19 0536  WBC 8.5  HGB 11.9*  HCT 37.8  PLT 404*   ------------------------------------------------------------------------------------------------------------------ Chemistries  Recent Labs  Lab 06/21/19 0536  NA 138  K 3.8  CL 106  CO2 24  GLUCOSE 120*  BUN 20  CREATININE 1.84*  CALCIUM 7.9*  AST 13*  ALT 10  ALKPHOS 73  BILITOT 0.4   ------------------------------------------------------------------------------------------------------------------  Cardiac Enzymes No results for input(s): TROPONINI in the last 168 hours. ------------------------------------------------------------------------------------------------------------------  RADIOLOGY:  Nm Hepatobiliary Liver Func  Result Date: 06/20/2019 CLINICAL DATA:  Evaluate for cholecystitis.  Pain. EXAM: NUCLEAR MEDICINE HEPATOBILIARY IMAGING TECHNIQUE: Sequential images of the abdomen were obtained out to 60 minutes following intravenous administration of radiopharmaceutical. RADIOPHARMACEUTICALS:  5.45 mCi Tc-18m  Choletec IV COMPARISON:  None. FINDINGS: There is prompt uptake of radiotracer in the liver and normal filling of the gallbladder within the first  hour  of imaging. There is no excretion from the gallbladder in till nearly 3 hours after administration of the radiopharmaceutical. No other abnormalities are identified. IMPRESSION: 1. There is prompt uptake in the liver and prompt filling of the gallbladder excluding cystic duct obstruction. 2. There is relative delayed excretion of activity into the bowel of uncertain etiology. However, the rapid filling of the gallbladder and the eventual excretion of activity into the bowel precludes a high-grade common bile duct obstruction. A CT scan from yesterday and an ultrasound from June 18, 2019 demonstrated no common bile duct dilatation. Electronically Signed   By: Dorise Bullion III M.D   On: 06/20/2019 18:16   Ct Abdomen Pelvis W Contrast  Result Date: 06/19/2019 CLINICAL DATA:  Right upper quadrant pain EXAM: CT ABDOMEN AND PELVIS WITH CONTRAST TECHNIQUE: Multidetector CT imaging of the abdomen and pelvis was performed using the standard protocol following bolus administration of intravenous contrast. CONTRAST:  160mL OMNIPAQUE IOHEXOL 300 MG/ML  SOLN COMPARISON:  Ultrasound from the previous day, CT from 04/16/2019 FINDINGS: Lower chest: No acute abnormality. Hepatobiliary: Fatty infiltration of the liver is noted. The known gallstone is not well appreciated within the gallbladder. No focal mass is identified. Previously described abnormality within the right lobe of the liver isn't appreciated on this exam and was not seen on prior MRI from 11/04/2017. Pancreas: Unremarkable. No pancreatic ductal dilatation or surrounding inflammatory changes. Spleen: Normal in size without focal abnormality. Adrenals/Urinary Tract: Adrenal glands are within normal limits. Kidneys demonstrate a normal enhancement pattern bilaterally. No obstructive changes are noted. The ureters are within normal limits bilaterally. The bladder is partially distended. Stomach/Bowel: The appendix has been surgically removed. No obstructive or  inflammatory changes of the larger small-bowel are seen. The stomach is within normal limits. Vascular/Lymphatic: Aortic atherosclerosis. No enlarged abdominal or pelvic lymph nodes. Note is made of retroaortic left renal vein. Reproductive: Uterus and bilateral adnexa are unremarkable. Other: No abdominal wall hernia or abnormality. No abdominopelvic ascites. Musculoskeletal: No acute or significant osseous findings. IMPRESSION: Fatty infiltration of the liver without focal mass. No acute abnormality noted. Electronically Signed   By: Inez Catalina M.D.   On: 06/19/2019 18:01     ASSESSMENT AND PLAN:   38 year old female patient with history of diabetes mellitus type 2, coronary disease, cardiac stent, chronic diastolic heart failure with EF of 30 to 35%, diabetic gastroparesis, hyperlipidemia, hypertension, cardiomyopathy currently under hospitalist service.  -Abdominal pain Cholelithiasis Cholecystectomy deferred by surgery HIDA scan reviewed Status post surgery evaluation CT abdomen reviewed and abdominal ultrasound reviewed Advance to full liquid diet IV ciprofloxacin antibiotic started as per surgery recommendation  -Intractable nausea vomiting Resolving slowly Gastroparesis IV fluid hydration Antiemetics Trial of Compazine Gastroenterology consult appreciated  -Accelerated hypertension Control blood pressure with oral hypertensives and PRN IV hypertensive medication  -Diabetes mellitus type 2 We will hold off on Lantus insulin as patient is n.p.o. Continue sliding scale coverage with insulin  -Coronary artery disease with history of cardiac stent Will resume aspirin and Plavix once patient tolerates oral fluids Continue metoprolol, nitrate and ARB Status post cardiology evaluation  -CKD stage III Monitor renal function Avoid nephrotoxic medication  -Chronic diastolic heart failure Does not appear to be in volume overload Medical management Appears stable  All the  records are reviewed and case discussed with Care Management/Social Worker. Management plans discussed with the patient, family and they are in agreement.  CODE STATUS: Full code  DVT Prophylaxis: SCDs  TOTAL TIME TAKING  CARE OF THIS PATIENT: 36 minutes.   POSSIBLE D/C IN 2 to 3 DAYS, DEPENDING ON CLINICAL CONDITION.  Saundra Shelling M.D on 06/21/2019 at 3:12 PM  Between 7am to 6pm - Pager - [phone]  After 6pm go to [email or web address] - password [email or web address] [facility]  Office  [phone]  CC: Primary care physician; Venita Lick, NP  Note: This dictation was prepared with Dragon dictation along with smaller phrase technology. Any transcriptional errors that result from this process are unintentional.

## 2019-06-21 NOTE — Progress Notes (Signed)
Cephas Darby, MD [address]  [place]  [place], [place] [address]  Main: [phone]  Fax: [phone] Pager: [phone]   Subjective: Patient reports ongoing nausea.  She was evaluated by general surgery who did not recommend cholecystectomy at this time.  Ultrasound, HIDA scan and CT abdomen unremarkable for gallbladder pathology.  Patient also reports constipation, generally has bowel movement every 3 to 4 days, hard in consistency associated with significant straining.  Her diabetes is uncontrolled, last hemoglobin A1c 9.5.  She does have diabetic gastroparesis.  She is on Zofran, Phenergan and Reglan which have not been helping.  Patient is followed by [facility].   Objective: Vital signs in last 24 hours: Vitals:   06/20/19 0802 06/20/19 1920 06/21/19 0517 06/21/19 0714  BP: (!) 155/91 (!) 182/93 (!) 149/79 117/72  Pulse: 77 (!) 101 78 72  Resp: 15 14 16 13   Temp: 98 F (36.7 C) 98.8 F (37.1 C) 98 F (36.7 C) 98.7 F (37.1 C)  TempSrc: Oral Oral Oral Oral  SpO2: 98% 96% 92% 99%  Weight:      Height:       Weight change:   Intake/Output Summary (Last 24 hours) at 06/21/2019 1606 Last data filed at 06/21/2019 1402 Gross per 24 hour  Intake 0 ml  Output 200 ml  Net -200 ml     Exam: Heart:: Regular rate and rhythm or S1S2 present Lungs: normal and clear to auscultation Abdomen: soft, nontender, normal bowel sounds   Lab Results: CBC Latest Ref Rng & Units 06/21/2019 06/20/2019 06/19/2019  WBC 4.0 - 10.5 K/uL 8.5 9.5 13.1(H)  Hemoglobin 12.0 - 15.0 g/dL 11.9(L) 12.7 12.4  Hematocrit 36.0 - 46.0 % 37.8 40.6 39.6  Platelets 150 - 400 K/uL 404(H) 459(H) 475(H)   CMP Latest Ref Rng & Units 06/21/2019 06/20/2019 06/19/2019  Glucose 70 - 99 mg/dL 120(H) 145(H) 160(H)  BUN 6 - 20 mg/dL 20 15 20   Creatinine 0.44 - 1.00 mg/dL 1.84(H) 1.25(H) 1.54(H)  Sodium 135 - 145 mmol/L 138 138 141  Potassium 3.5 - 5.1 mmol/L 3.8 3.9 3.9  Chloride 98 - 111 mmol/L 106  106 106  CO2 22 - 32 mmol/L 24 24 23   Calcium 8.9 - 10.3 mg/dL 7.9(L) 8.5(L) 9.0  Total Protein 6.5 - 8.1 g/dL 5.9(L) - -  Total Bilirubin 0.3 - 1.2 mg/dL 0.4 - -  Alkaline Phos 38 - 126 U/L 73 - -  AST 15 - 41 U/L 13(L) - -  ALT 0 - 44 U/L 10 - -    Micro Results: Recent Results (from the past 240 hour(s))  SARS Coronavirus 2 (CEPHEID- Performed in [place] hospital lab), Hosp Order     Status: None   Collection Time: 06/18/19 11:43 AM   Specimen: Nasopharyngeal Swab  Result Value Ref Range Status   SARS Coronavirus 2 NEGATIVE NEGATIVE Final    Comment: (NOTE) If result is NEGATIVE SARS-CoV-2 target nucleic acids are NOT DETECTED. The SARS-CoV-2 RNA is generally detectable in upper and lower  respiratory specimens during the acute phase of infection. The lowest  concentration of SARS-CoV-2 viral copies this assay can detect is 250  copies / mL. A negative result does not preclude SARS-CoV-2 infection  and should not be used as the sole basis for treatment or other  patient management decisions.  A negative result may occur with  improper specimen collection / handling, submission of specimen other  than nasopharyngeal swab, presence of viral  mutation(s) within the  areas targeted by this assay, and inadequate number of viral copies  (<250 copies / mL). A negative result must be combined with clinical  observations, patient history, and epidemiological information. If result is POSITIVE SARS-CoV-2 target nucleic acids are DETECTED. The SARS-CoV-2 RNA is generally detectable in upper and lower  respiratory specimens dur ing the acute phase of infection.  Positive  results are indicative of active infection with SARS-CoV-2.  Clinical  correlation with patient history and other diagnostic information is  necessary to determine patient infection status.  Positive results do  not rule out bacterial infection or co-infection with other viruses. If result is PRESUMPTIVE  POSTIVE SARS-CoV-2 nucleic acids MAY BE PRESENT.   A presumptive positive result was obtained on the submitted specimen  and confirmed on repeat testing.  While 2019 novel coronavirus  (SARS-CoV-2) nucleic acids may be present in the submitted sample  additional confirmatory testing may be necessary for epidemiological  and / or clinical management purposes  to differentiate between  SARS-CoV-2 and other Sarbecovirus currently known to infect humans.  If clinically indicated additional testing with an alternate test  methodology [phone]) is advised. The SARS-CoV-2 RNA is generally  detectable in upper and lower respiratory sp ecimens during the acute  phase of infection. The expected result is Negative. Fact Sheet for Patients:  StrictlyIdeas.no Fact Sheet for Healthcare Providers: BankingDealers.co.za This test is not yet approved or cleared by the [place] FDA and has been authorized for detection and/or diagnosis of SARS-CoV-2 by FDA under an Emergency Use Authorization (EUA).  This EUA will remain in effect (meaning this test can be used) for the duration of the COVID-19 declaration under Section 564(b)(1) of the Act, 21 U.S.C. section 360bbb-3(b)(1), unless the authorization is terminated or revoked sooner. Performed at [facility], [address] [place], [place] [address]   MRSA PCR Screening     Status: Abnormal   Collection Time: 06/20/19 11:07 PM   Specimen: Nasopharyngeal  Result Value Ref Range Status   MRSA by PCR POSITIVE (A) NEGATIVE Final    Comment:        The GeneXpert MRSA Assay (FDA approved for NASAL specimens only), is one component of a comprehensive MRSA colonization surveillance program. It is not intended to diagnose MRSA infection nor to guide or monitor treatment for MRSA infections. RESULT CALLED TO, READ BACK BY AND VERIFIED WITH: DIANDRA MOLSON AT 0100 ON 06/21/19 RWW Performed at  [facility], [address] [place], [place] [address]    Studies/Results: Nm Hepatobiliary Liver Func  Result Date: 06/20/2019 CLINICAL DATA:  Evaluate for cholecystitis.  Pain. EXAM: NUCLEAR MEDICINE HEPATOBILIARY IMAGING TECHNIQUE: Sequential images of the abdomen were obtained out to 60 minutes following intravenous administration of radiopharmaceutical. RADIOPHARMACEUTICALS:  5.45 mCi Tc-37m  Choletec IV COMPARISON:  None. FINDINGS: There is prompt uptake of radiotracer in the liver and normal filling of the gallbladder within the first hour of imaging. There is no excretion from the gallbladder in till nearly 3 hours after administration of the radiopharmaceutical. No other abnormalities are identified. IMPRESSION: 1. There is prompt uptake in the liver and prompt filling of the gallbladder excluding cystic duct obstruction. 2. There is relative delayed excretion of activity into the bowel of uncertain etiology. However, the rapid filling of the gallbladder and the eventual excretion of activity into the bowel precludes a high-grade common bile duct obstruction. A CT scan from yesterday and an ultrasound from June 18, 2019 demonstrated  no common bile duct dilatation. Electronically Signed   By: Dorise Bullion III M.D   On: 06/20/2019 18:16   Ct Abdomen Pelvis W Contrast  Result Date: 06/19/2019 CLINICAL DATA:  Right upper quadrant pain EXAM: CT ABDOMEN AND PELVIS WITH CONTRAST TECHNIQUE: Multidetector CT imaging of the abdomen and pelvis was performed using the standard protocol following bolus administration of intravenous contrast. CONTRAST:  136mL OMNIPAQUE IOHEXOL 300 MG/ML  SOLN COMPARISON:  Ultrasound from the previous day, CT from 04/16/2019 FINDINGS: Lower chest: No acute abnormality. Hepatobiliary: Fatty infiltration of the liver is noted. The known gallstone is not well appreciated within the gallbladder. No focal mass is identified. Previously described abnormality within the  right lobe of the liver isn't appreciated on this exam and was not seen on prior MRI from 11/04/2017. Pancreas: Unremarkable. No pancreatic ductal dilatation or surrounding inflammatory changes. Spleen: Normal in size without focal abnormality. Adrenals/Urinary Tract: Adrenal glands are within normal limits. Kidneys demonstrate a normal enhancement pattern bilaterally. No obstructive changes are noted. The ureters are within normal limits bilaterally. The bladder is partially distended. Stomach/Bowel: The appendix has been surgically removed. No obstructive or inflammatory changes of the larger small-bowel are seen. The stomach is within normal limits. Vascular/Lymphatic: Aortic atherosclerosis. No enlarged abdominal or pelvic lymph nodes. Note is made of retroaortic left renal vein. Reproductive: Uterus and bilateral adnexa are unremarkable. Other: No abdominal wall hernia or abnormality. No abdominopelvic ascites. Musculoskeletal: No acute or significant osseous findings. IMPRESSION: Fatty infiltration of the liver without focal mass. No acute abnormality noted. Electronically Signed   By: Inez Catalina M.D.   On: 06/19/2019 18:01   Medications:  I have reviewed the patient's current medications. Prior to Admission:  Medications Prior to Admission  Medication Sig Dispense Refill Last Dose   amitriptyline (ELAVIL) 25 MG tablet Take 25 mg by mouth at bedtime.   06/17/2019 at Unknown time   aspirin EC 81 MG tablet Take 81 mg by mouth daily.   06/17/2019 at Unknown time   Cholecalciferol 2000 units TABS Take 2,000 Units by mouth daily.    06/17/2019 at Unknown time   clopidogrel (PLAVIX) 75 MG tablet Take 4 tabs at 7pm tonight (12/28), then 1 tab daily beginning 12/29. (Patient taking differently: Take 75 mg by mouth daily. ) 90 tablet 3 06/17/2019 at Unknown time   DULoxetine (CYMBALTA) 30 MG capsule Take 60 mg by mouth daily.   3 06/17/2019 at Unknown time   etonogestrel (NEXPLANON) 68 MG IMPL implant 1 each  by Subdermal route once.   unknown at unknown   FARXIGA 10 MG TABS tablet Take 10 mg by mouth daily.   11 06/17/2019 at Unknown time   furosemide (LASIX) 40 MG tablet Take 1 tablet (40 mg total) by mouth every other day. 30 tablet 5 prn at prn   glimepiride (AMARYL) 4 MG tablet TAKE 1 TABLET BY MOUTH ONCE DAILY (Patient taking differently: Take 4 mg by mouth daily. ) 90 tablet 1 06/17/2019 at Unknown time   insulin aspart (NOVOLOG) 100 UNIT/ML injection Inject 10 Units into the skin 3 (three) times daily with meals. 10 mL 11 06/17/2019 at Unknown time   irbesartan (AVAPRO) 75 MG tablet Take 1 tablet (75 mg total) by mouth daily. 30 tablet 6 06/17/2019 at Unknown time   isosorbide mononitrate (IMDUR) 30 MG 24 hr tablet Take 2 tablets (60 mg total) by mouth daily. 90 tablet 3 06/17/2019 at Unknown time   loperamide (IMODIUM A-D) 2 MG  tablet Take 8-12 mg by mouth daily as needed for diarrhea or loose stools.   prn at prn   meclizine (ANTIVERT) 25 MG tablet Take 25 mg by mouth 3 (three) times daily as needed for dizziness.   prn at prn   metoCLOPramide (REGLAN) 10 MG tablet Take 1 tablet (10 mg total) by mouth 3 (three) times daily with meals for 10 days. 30 tablet 0 06/17/2019 at Unknown time   metoprolol tartrate (LOPRESSOR) 100 MG tablet Take 1 tablet (100 mg total) by mouth 2 (two) times daily. 60 tablet 6 06/17/2019 at Unknown time   mirtazapine (REMERON) 30 MG tablet Take 60 mg by mouth daily.   3 06/17/2019 at Unknown time   nitroGLYCERIN (NITROSTAT) 0.4 MG SL tablet Take 1 tab under your tongue while sitting for chest pain, If no relief may repeat, one tab every 5 min up to 3 tabs total over 15 mins. 25 tablet 3 prn at prn   ondansetron (ZOFRAN) 4 MG tablet Take 1 tablet (4 mg total) by mouth every 8 (eight) hours as needed for nausea or vomiting. 8 tablet 0 prn at prn   pantoprazole (PROTONIX) 40 MG tablet TAKE 1 TABLET BY MOUTH ONCE DAILY AT 6AM (Patient taking differently: Take 40 mg by mouth 2  (two) times daily. ) 90 tablet 1 06/17/2019 at Unknown time   polyethylene glycol (MIRALAX / GLYCOLAX) 17 g packet Take 17 g by mouth daily as needed.    prn at prn   insulin glargine (LANTUS) 100 UNIT/ML injection Inject 60 Units into the skin daily.   Not Taking at Unknown time   Scheduled:  amitriptyline  25 mg Oral QHS   aspirin  81 mg Oral Daily   Chlorhexidine Gluconate Cloth  6 each Topical Q0600   cholecalciferol  2,000 Units Oral Daily   ciprofloxacin  500 mg Oral BID   DULoxetine  60 mg Oral Daily   enoxaparin (LOVENOX) injection  40 mg Subcutaneous Q12H   erythromycin  250 mg Oral TID   insulin aspart  0-5 Units Subcutaneous QHS   insulin aspart  0-9 Units Subcutaneous TID WC   irbesartan  75 mg Oral Daily   isosorbide mononitrate  60 mg Oral Daily   metoCLOPramide (REGLAN) injection  5 mg Intravenous Q6H   metoprolol tartrate  100 mg Oral BID   mirtazapine  60 mg Oral QHS   mupirocin ointment  1 application Nasal BID   pantoprazole  40 mg Oral BID   polyethylene glycol  17 g Oral BID   Continuous:  sodium chloride 50 mL/hr at 06/21/19 0250   TIW:[id number] **OR** acetaminophen, alum & mag hydroxide-simeth, labetalol, meclizine, morphine injection, nitroGLYCERIN, ondansetron **OR** ondansetron (ZOFRAN) IV, oxyCODONE, prochlorperazine Anti-infectives (From admission, onward)   Start     Dose/Rate Route Frequency Ordered Stop   06/21/19 2000  ciprofloxacin (CIPRO) tablet 500 mg     500 mg Oral 2 times daily 06/21/19 1224     06/21/19 1615  erythromycin (E-MYCIN) tablet 250 mg     250 mg Oral 3 times daily 06/21/19 1604     06/19/19 1515  ciprofloxacin (CIPRO) IVPB 400 mg  Status:  Discontinued     400 mg 200 mL/hr over 60 Minutes Intravenous 2 times daily 06/19/19 1459 06/21/19 1224     Scheduled Meds:  amitriptyline  25 mg Oral QHS   aspirin  81 mg Oral Daily   Chlorhexidine Gluconate Cloth  6 each Topical Q0600  cholecalciferol  2,000  Units Oral Daily   ciprofloxacin  500 mg Oral BID   DULoxetine  60 mg Oral Daily   enoxaparin (LOVENOX) injection  40 mg Subcutaneous Q12H   erythromycin  250 mg Oral TID   insulin aspart  0-5 Units Subcutaneous QHS   insulin aspart  0-9 Units Subcutaneous TID WC   irbesartan  75 mg Oral Daily   isosorbide mononitrate  60 mg Oral Daily   metoCLOPramide (REGLAN) injection  5 mg Intravenous Q6H   metoprolol tartrate  100 mg Oral BID   mirtazapine  60 mg Oral QHS   mupirocin ointment  1 application Nasal BID   pantoprazole  40 mg Oral BID   polyethylene glycol  17 g Oral BID   Continuous Infusions:  sodium chloride 50 mL/hr at 06/21/19 0250   PRN Meds:.acetaminophen **OR** acetaminophen, alum & mag hydroxide-simeth, labetalol, meclizine, morphine injection, nitroGLYCERIN, ondansetron **OR** ondansetron (ZOFRAN) IV, oxyCODONE, prochlorperazine   Assessment: Active Problems:   Diabetic gastroparesis ([place])   Preop cardiovascular exam  Patient is evaluated by cardiology and deemed moderate risk and okay to hold Plavix  Plan: Clear liquid diet Continue Zofran, Phenergan, Reglan Trial of erythromycin for gastroparesis, ordered If her symptoms are persistent, recommend upper endoscopy as inpatient Reiterated to her about stopping marijuana use  Tight control of diabetes Small frequent meals    LOS: 3 days   Apryll Hinkle 06/21/2019, 4:06 PM

## 2019-06-21 NOTE — Progress Notes (Signed)
[facility] SURGICAL PROGRESS NOTE (cpt [id number])  Hospital Day(s): 3.   Post op day(s):  Marland Kitchen   Interval History: Patient seen and examined, no acute events or new complaints overnight. Patient reports that she is still having some mild intermittent nausea. No reports of fever, chills, emesis. She has tolerates small sips of a clear liquid diet without significant issue. No further complaints. Mild worsening renal function (sCr - 1.84).   Review of Systems:  Constitutional: denies fever, chills  HEENT: denies cough or congestion  Respiratory: denies any shortness of breath  Cardiovascular: denies chest pain or palpitations  Gastrointestinal: denies abdominal pain, + nausea (intermittent, improving), denies emesis Genitourinary: denies burning with urination or urinary frequency   Vital signs in last 24 hours: [min-max] current  Temp:  [98 F (36.7 C)-98.8 F (37.1 C)] 98.7 F (37.1 C) (07/13 0714) Pulse Rate:  [72-101] 72 (07/13 0714) Resp:  [13-16] 13 (07/13 0714) BP: (117-182)/(72-93) 117/72 (07/13 0714) SpO2:  [92 %-99 %] 99 % (07/13 0714)     Height: 5\' 5"  (165.1 cm) Weight: 119.3 kg BMI (Calculated): 43.77   Intake/Output last 2 shifts:  No intake/output data recorded.   Physical Exam:  Constitutional: alert, cooperative and no distress  HENT: normocephalic without obvious abnormality  Eyes: PERRL, EOM's grossly intact and symmetric  Respiratory: breathing non-labored at rest  Cardiovascular: regular rate and sinus rhythm  Gastrointestinal: soft, non-tender, and non-distended Musculoskeletal: no edema or wounds, motor and sensation grossly intact, NT    Labs:  CBC Latest Ref Rng & Units 06/21/2019 06/20/2019 06/19/2019  WBC 4.0 - 10.5 K/uL 8.5 9.5 13.1(H)  Hemoglobin 12.0 - 15.0 g/dL 11.9(L) 12.7 12.4  Hematocrit 36.0 - 46.0 % 37.8 40.6 39.6  Platelets 150 - 400 K/uL 404(H) 459(H) 475(H)   CMP Latest Ref Rng & Units 06/21/2019 06/20/2019 06/19/2019   Glucose 70 - 99 mg/dL 120(H) 145(H) 160(H)  BUN 6 - 20 mg/dL 20 15 20   Creatinine 0.44 - 1.00 mg/dL 1.84(H) 1.25(H) 1.54(H)  Sodium 135 - 145 mmol/L 138 138 141  Potassium 3.5 - 5.1 mmol/L 3.8 3.9 3.9  Chloride 98 - 111 mmol/L 106 106 106  CO2 22 - 32 mmol/L 24 24 23   Calcium 8.9 - 10.3 mg/dL 7.9(L) 8.5(L) 9.0  Total Protein 6.5 - 8.1 g/dL 5.9(L) - -  Total Bilirubin 0.3 - 1.2 mg/dL 0.4 - -  Alkaline Phos 38 - 126 U/L 73 - -  AST 15 - 41 U/L 13(L) - -  ALT 0 - 44 U/L 10 - -     Imaging studies: No new pertinent imaging studies   Assessment/Plan: (ICD-10's: K58.20) 38 y.o. female with improving nausea who was admitted for intractable nausea/emesis of unknown etiology and subsequently found to have cholelithiasis without evidence of cholecystitis, complicated by pertinent comorbidities including CAD s/p PCI most recently in February 2020 on PLavix.   - Advance to full liquids, monitor for N/V  - pain control prn; antiemetics prn  - monitor abdominal examination  - Again, given comorbidities, will defer cholecystectomy at this time and continue to work up nausea/emesis. She can follow up outpatient for evaluation and discussing interval cholecystectomy, which she is agreeable with   - Appreciate cardiology input and risk stratification  - mobilization encouraged   - further management per primary team; will follow   All of the above findings and recommendations were discussed with the patient, and the medical team, and all of patient's questions were answered to her  expressed satisfaction.   -- Edison Simon, PA-C [facility] 06/21/2019, 10:39 AM [phone] M-F: 7am - 4pm

## 2019-06-22 LAB — HIV ANTIBODY (ROUTINE TESTING W REFLEX): HIV Screen 4th Generation wRfx: NONREACTIVE

## 2019-06-22 LAB — GLUCOSE, CAPILLARY: Glucose-Capillary: 84 mg/dL (ref 70–99)

## 2019-06-22 MED ORDER — PANTOPRAZOLE SODIUM 40 MG PO TBEC: 40.0000 mg | DELAYED_RELEASE_TABLET | Freq: Two times a day (BID) | ORAL | 0 refills | Status: DC

## 2019-06-22 MED ORDER — CLOPIDOGREL BISULFATE 75 MG PO TABS: 75.0000 mg | ORAL_TABLET | Freq: Every day | ORAL | 0 refills | Status: AC

## 2019-06-22 MED ORDER — CIPROFLOXACIN HCL 500 MG PO TABS: 500.0000 mg | ORAL_TABLET | Freq: Two times a day (BID) | ORAL | 0 refills | Status: AC

## 2019-06-22 NOTE — TOC Transition Note (Signed)
Transition of Care [facility]) - CM/SW Discharge Note   Patient Details  Name: [name] [name] MRN: [id number] Date of Birth: [birth year]  Transition of Care [facility]) CM/SW Contact:  Elza Rafter, RN Phone Number: 06/22/2019, 10:37 AM   Clinical Narrative:   Discharge order placed.  No further needs identified by CM at this time.      Final next level of care: Home/Self Care Barriers to Discharge: No Barriers Identified   Patient Goals and CMS Choice Patient states their goals for this hospitalization and ongoing recovery are:: discharge to home with husband      Discharge Placement                       Discharge Plan and Services                                     Social Determinants of Health (SDOH) Interventions     Readmission Risk Interventions Readmission Risk Prevention Plan 06/21/2019  Transportation Screening Complete  PCP or Specialist Appt within 3-5 Days Complete  HRI or [place] Complete  Social Work Consult for [place] Planning/Counseling Complete  Palliative Care Screening Not Applicable  Medication Review [employer] photographer) Complete  Some recent data might be hidden

## 2019-06-22 NOTE — Progress Notes (Signed)
IV and tele removed from patient. Discharge instructions given to patient. Verbalized understanding. No acute distress at this time. Mother to transport patient home.

## 2019-06-22 NOTE — Discharge Summary (Signed)
[facility] [place] NAME: [name] [name]    MR#:  [id number]  DATE OF BIRTH:  1981/01/04  DATE OF ADMISSION:  06/18/2019 ADMITTING PHYSICIAN: Loletha Grayer, MD  DATE OF DISCHARGE: 06/22/2019 12:16 PM  PRIMARY CARE PHYSICIAN: Marnee Guarneri T, NP   ADMISSION DIAGNOSIS:  Intractable nausea and vomiting [R11.2] Abdominal pain [R10.9]  DISCHARGE DIAGNOSIS:  Diabetic gastroparesis Abdominal pain Intractable nausea vomiting Cholelithiasis Accelerated hypertension CKD stage III Chronic diastolic heart failure SECONDARY DIAGNOSIS:   Past Medical History:  Diagnosis Date  . CAD (coronary artery disease)    a. 03/2015 NSTEMI/PCI: LCX 141m (DES), OM1 100p (DES - vessel labeled Ramus in subsequent cath report). Minor irregs to LAD/RCA; b. 04/2015 Cath: LM nl, LAD 40p, RI patent stent, LCX patent stent, RCA nl, EF 55-65%; c. 02/2016 MV: basal inferolateral and mid inferolateral defect/infarct w/o ischemia. EF 55-65%; d. 11/2018 PCI: LM 30ost, LAD 30p, LCX 95p/m (PTCA->20%), OM1 10ost, OM2 40ost, RCA 85p.  Marland Kitchen Chronic abdominal pain   . Chronic diastolic (congestive) heart failure ([place])    a. 03/2015 Echo: EF 30-35%, mild concentric LVH, severe HK of inf, inflat, & lat walls, mod MR, mild TR;  b. 04/2015 LV Gram: EF 55-65%; c. 09/2017 Echo: EF 55-60%, no rwma. Mild MR; d. 11/2018 Echo: Ef 60-65%, no rwma.  . CKD (chronic kidney disease), stage III ([place])   . Diabetes type 2, controlled ([place])    a. since 2002   . Diabetic gastroparesis ([facility])    a. chronic nausea/vomiting.  . Diverticulosis, sigmoid 07/2016  . Heavy menses    a. H/O IUD - expired in 2014 - remains in place.  . Hyperlipidemia   . Hypertension   . Iron deficiency anemia   . Ischemic cardiomyopathy (resolved)    a. 03/2015 EF 30-35% post NSTEMI;  b. 04/2015 EF 55-65% on LV gram; c. 09/2017 Echo: EF 55-60%; d. 11/2018 Echo: Ef 60-65%.  . Obesity   . Tobacco abuse    a. quit 03/2015.  Marland Kitchen  Uterine fibroid   . Vertigo   . Vitamin D deficiency      ADMITTING HISTORY [name] [name]  is a 38 y.o. female with a known history of diabetes presents with abdominal pain.  She says she woke up with severe abdominal pain 8 out of 1070 this morning.  It started around 1 AM.  Still having abdominal pain.  Nausea vomiting too many times to count.  Some blood in the vomit.  No diarrhea.  Patient does have a history of diabetes and smoking marijuana.  This has happened before for her.   HOSPITAL COURSE:  Patient admitted to medical floor.  Patient was hydrated with IV fluids.  Initially kept n.p.o.. Patient was worked up with abdominal sound, CT abdomen and HIDA scan.  Surgery consult was done along with gastroenterology and cardiology.  Surgery did not recommend any cholecystectomy.  Patient was seen by gastroenterology for gastroparesis.  Advised to stop of use of cannabinoids.  Patient nausea vomiting responded to antiemetics.  Her abdominal pain improved.  Patient also received IV ciprofloxacin antibiotic during the stay in the hospital.  Patient will resume aspirin and Plavix at discharge.  Blood pressure was also better controlled during hospitalization.  CONSULTS OBTAINED:  Treatment Team:  Jules Husbands, MD Jonathon Bellows, MD Minna Merritts, MD  DRUG ALLERGIES:   Allergies  Allergen Reactions  . Lisinopril Anaphylaxis, Itching and Swelling  . Rosemary Oil Anaphylaxis  .  Shellfish Allergy Itching and Swelling  . Other Itching and Swelling    [place] seasoning  . Metformin And Related Diarrhea, Nausea And Vomiting and Rash    DISCHARGE MEDICATIONS:   Allergies as of 06/22/2019      Reactions   Lisinopril Anaphylaxis, Itching, Swelling   Rosemary Oil Anaphylaxis   Shellfish Allergy Itching, Swelling   Other Itching, Swelling   [place] seasoning   Metformin And Related Diarrhea, Nausea And Vomiting, Rash      Medication List    TAKE these medications   amitriptyline  25 MG tablet Commonly known as: ELAVIL Take 25 mg by mouth at bedtime.   aspirin EC 81 MG tablet Take 81 mg by mouth daily.   Cholecalciferol 50 MCG (2000 UT) Tabs Take 2,000 Units by mouth daily.   ciprofloxacin 500 MG tablet Commonly known as: CIPRO Take 1 tablet (500 mg total) by mouth 2 (two) times daily for 3 days.   clopidogrel 75 MG tablet Commonly known as: Plavix Take 1 tablet (75 mg total) by mouth daily.   DULoxetine 30 MG capsule Commonly known as: CYMBALTA Take 60 mg by mouth daily.   Farxiga 10 MG Tabs tablet Generic drug: dapagliflozin propanediol Take 10 mg by mouth daily.   furosemide 40 MG tablet Commonly known as: LASIX Take 1 tablet (40 mg total) by mouth every other day.   glimepiride 4 MG tablet Commonly known as: AMARYL TAKE 1 TABLET BY MOUTH ONCE DAILY   insulin aspart 100 UNIT/ML injection Commonly known as: novoLOG Inject 10 Units into the skin 3 (three) times daily with meals.   insulin glargine 100 UNIT/ML injection Commonly known as: LANTUS Inject 60 Units into the skin daily.   irbesartan 75 MG tablet Commonly known as: AVAPRO Take 1 tablet (75 mg total) by mouth daily.   isosorbide mononitrate 30 MG 24 hr tablet Commonly known as: IMDUR Take 2 tablets (60 mg total) by mouth daily.   loperamide 2 MG tablet Commonly known as: IMODIUM A-D Take 8-12 mg by mouth daily as needed for diarrhea or loose stools.   meclizine 25 MG tablet Commonly known as: ANTIVERT Take 25 mg by mouth 3 (three) times daily as needed for dizziness.   metoCLOPramide 10 MG tablet Commonly known as: REGLAN Take 1 tablet (10 mg total) by mouth 3 (three) times daily with meals for 10 days.   metoprolol tartrate 100 MG tablet Commonly known as: LOPRESSOR Take 1 tablet (100 mg total) by mouth 2 (two) times daily.   mirtazapine 30 MG tablet Commonly known as: REMERON Take 60 mg by mouth daily.   Nexplanon 68 MG Impl implant Generic drug:  etonogestrel 1 each by Subdermal route once.   nitroGLYCERIN 0.4 MG SL tablet Commonly known as: NITROSTAT Take 1 tab under your tongue while sitting for chest pain, If no relief may repeat, one tab every 5 min up to 3 tabs total over 15 mins.   ondansetron 4 MG tablet Commonly known as: Zofran Take 1 tablet (4 mg total) by mouth every 8 (eight) hours as needed for nausea or vomiting.   pantoprazole 40 MG tablet Commonly known as: PROTONIX Take 1 tablet (40 mg total) by mouth 2 (two) times daily.   polyethylene glycol 17 g packet Commonly known as: MIRALAX / GLYCOLAX Take 17 g by mouth daily as needed.       Today  Patient seen and evaluated today Tolerating diet well Abdominal pain resolved Nausea and vomiting  resolved Hemodynamically stable VITAL SIGNS:  Blood pressure 137/73, pulse 72, temperature 98.2 F (36.8 C), temperature source Oral, resp. rate 16, height 5\' 5"  (1.651 m), weight 119.3 kg, SpO2 99 %.  I/O:    Intake/Output Summary (Last 24 hours) at 06/22/2019 1609 Last data filed at 06/21/2019 2326 Gross per 24 hour  Intake -  Output 0 ml  Net 0 ml    PHYSICAL EXAMINATION:  Physical Exam  GENERAL:  38 y.o.-year-old patient lying in the bed with no acute distress.  LUNGS: Normal breath sounds bilaterally, no wheezing, rales,rhonchi or crepitation. No use of accessory muscles of respiration.  CARDIOVASCULAR: S1, S2 normal. No murmurs, rubs, or gallops.  ABDOMEN: Soft, non-tender, non-distended. Bowel sounds present. No organomegaly or mass.  NEUROLOGIC: Moves all 4 extremities. PSYCHIATRIC: The patient is alert and oriented x 3.  SKIN: No obvious rash, lesion, or ulcer.   DATA REVIEW:   CBC Recent Labs  Lab 06/21/19 0536  WBC 8.5  HGB 11.9*  HCT 37.8  PLT 404*    Chemistries  Recent Labs  Lab 06/21/19 0536  NA 138  K 3.8  CL 106  CO2 24  GLUCOSE 120*  BUN 20  CREATININE 1.84*  CALCIUM 7.9*  AST 13*  ALT 10  ALKPHOS 73  BILITOT 0.4     Cardiac Enzymes No results for input(s): TROPONINI in the last 168 hours.  Microbiology Results  Results for orders placed or performed during the hospital encounter of 06/18/19  SARS Coronavirus 2 (CEPHEID- Performed in [facility] hospital lab), Hosp Order     Status: None   Collection Time: 06/18/19 11:43 AM   Specimen: Nasopharyngeal Swab  Result Value Ref Range Status   SARS Coronavirus 2 NEGATIVE NEGATIVE Final    Comment: (NOTE) If result is NEGATIVE SARS-CoV-2 target nucleic acids are NOT DETECTED. The SARS-CoV-2 RNA is generally detectable in upper and lower  respiratory specimens during the acute phase of infection. The lowest  concentration of SARS-CoV-2 viral copies this assay can detect is 250  copies / mL. A negative result does not preclude SARS-CoV-2 infection  and should not be used as the sole basis for treatment or other  patient management decisions.  A negative result may occur with  improper specimen collection / handling, submission of specimen other  than nasopharyngeal swab, presence of viral mutation(s) within the  areas targeted by this assay, and inadequate number of viral copies  (<250 copies / mL). A negative result must be combined with clinical  observations, patient history, and epidemiological information. If result is POSITIVE SARS-CoV-2 target nucleic acids are DETECTED. The SARS-CoV-2 RNA is generally detectable in upper and lower  respiratory specimens dur ing the acute phase of infection.  Positive  results are indicative of active infection with SARS-CoV-2.  Clinical  correlation with patient history and other diagnostic information is  necessary to determine patient infection status.  Positive results do  not rule out bacterial infection or co-infection with other viruses. If result is PRESUMPTIVE POSTIVE SARS-CoV-2 nucleic acids MAY BE PRESENT.   A presumptive positive result was obtained on the submitted specimen  and confirmed on  repeat testing.  While 2019 novel coronavirus  (SARS-CoV-2) nucleic acids may be present in the submitted sample  additional confirmatory testing may be necessary for epidemiological  and / or clinical management purposes  to differentiate between  SARS-CoV-2 and other Sarbecovirus currently known to infect humans.  If clinically indicated additional testing with an alternate test  methodology [phone]) is advised. The SARS-CoV-2 RNA is generally  detectable in upper and lower respiratory sp ecimens during the acute  phase of infection. The expected result is Negative. Fact Sheet for Patients:  StrictlyIdeas.no Fact Sheet for Healthcare Providers: BankingDealers.co.za This test is not yet approved or cleared by the [place] FDA and has been authorized for detection and/or diagnosis of SARS-CoV-2 by FDA under an Emergency Use Authorization (EUA).  This EUA will remain in effect (meaning this test can be used) for the duration of the COVID-19 declaration under Section 564(b)(1) of the Act, 21 U.S.C. section 360bbb-3(b)(1), unless the authorization is terminated or revoked sooner. Performed at [facility], [address] [place], [place] [address]   MRSA PCR Screening     Status: Abnormal   Collection Time: 06/20/19 11:07 PM   Specimen: Nasopharyngeal  Result Value Ref Range Status   MRSA by PCR POSITIVE (A) NEGATIVE Final    Comment:        The GeneXpert MRSA Assay (FDA approved for NASAL specimens only), is one component of a comprehensive MRSA colonization surveillance program. It is not intended to diagnose MRSA infection nor to guide or monitor treatment for MRSA infections. RESULT CALLED TO, READ BACK BY AND VERIFIED WITH: DIANDRA MOLSON AT 0100 ON 06/21/19 RWW Performed at [facility], [address] [place], [place] [address]     RADIOLOGY:  Nm Hepatobiliary Liver Func  Result Date:  06/20/2019 CLINICAL DATA:  Evaluate for cholecystitis.  Pain. EXAM: NUCLEAR MEDICINE HEPATOBILIARY IMAGING TECHNIQUE: Sequential images of the abdomen were obtained out to 60 minutes following intravenous administration of radiopharmaceutical. RADIOPHARMACEUTICALS:  5.45 mCi Tc-38m  Choletec IV COMPARISON:  None. FINDINGS: There is prompt uptake of radiotracer in the liver and normal filling of the gallbladder within the first hour of imaging. There is no excretion from the gallbladder in till nearly 3 hours after administration of the radiopharmaceutical. No other abnormalities are identified. IMPRESSION: 1. There is prompt uptake in the liver and prompt filling of the gallbladder excluding cystic duct obstruction. 2. There is relative delayed excretion of activity into the bowel of uncertain etiology. However, the rapid filling of the gallbladder and the eventual excretion of activity into the bowel precludes a high-grade common bile duct obstruction. A CT scan from yesterday and an ultrasound from June 18, 2019 demonstrated no common bile duct dilatation. Electronically Signed   By: Dorise Bullion III M.D   On: 06/20/2019 18:16    Follow up with PCP in 1 week.  Management plans discussed with the patient, family and they are in agreement.  CODE STATUS: Full code Code Status History    Date Active Date Inactive Code Status Order ID Comments User Context   06/18/2019 1206 06/22/2019 1522 Full Code [id number]  Loletha Grayer, MD ED   01/12/2019 1023 01/13/2019 1705 Full Code [id number]  [name], MD Inpatient   12/04/2018 1117 12/05/2018 1817 Full Code [id number]  Wellington Hampshire, MD Inpatient   05/02/2018 0455 05/04/2018 1433 Full Code [id number]  Arta Silence, MD ED   01/22/2018 0103 01/24/2018 1711 Full Code [id number]  Phillips Grout, MD ED   08/03/2017 2031 08/04/2017 2006 Full Code [id number]  Dustin Flock, MD Inpatient   08/05/2016 0832 08/08/2016 2008 Full Code [id number]  Clayburn Pert, MD Inpatient   03/01/2016 0612 03/01/2016 1954 Full Code [id number]  Harrie Foreman, MD Inpatient   08/21/2015 1736 08/22/2015 2203 Full Code [id number]  Dustin Flock, MD ED  07/22/2015 1105 07/23/2015 1832 Full Code [id number]  Epifanio Lesches, MD Inpatient   05/05/2015 1059 05/05/2015 2055 Full Code [id number]  Martinique, Peter M, MD Inpatient   05/05/2015 1281 05/05/2015 1059 Full Code [id number]  Rise Patience, MD Inpatient   Advance Care Planning Activity      TOTAL TIME TAKING CARE OF THIS PATIENT ON DAY OF DISCHARGE: more than 35 minutes.   Saundra Shelling M.D on 06/22/2019 at 4:09 PM  Between 7am to 6pm - Pager - [phone]  After 6pm go to [email or web address] - password [email or web address] [facility]  Office  [phone]  CC: Primary care physician; Venita Lick, NP  Note: This dictation was prepared with Dragon dictation along with smaller phrase technology. Any transcriptional errors that result from this process are unintentional.

## 2019-06-22 NOTE — Progress Notes (Signed)
[facility] SURGICAL PROGRESS NOTE (cpt [phone])  Hospital Day(s): 4.   Interval History: Patient seen and examined, no acute events or new complaints overnight. Patient reports that she continues to have some intermittent nausea. No reports of fever, chills, emesis, or worsening abdominal pain. Still with some epigastric discomfort. She was able to tolerate some full liquids yesterday. Started on erythromycin for gastroparesis.   Review of Systems:  Constitutional: denies fever, chills  Respiratory: denies any shortness of breath  Cardiovascular: denies chest pain or palpitations  Gastrointestinal: + epigastric pain, + nausea, denies vomiting Genitourinary: denies burning with urination or urinary frequency   Vital signs in last 24 hours: [min-max] current  Temp:  [98.2 F (36.8 C)-99.1 F (37.3 C)] 98.2 F (36.8 C) (07/14 0731) Pulse Rate:  [72-76] 72 (07/14 0731) Resp:  [16-20] 16 (07/14 0731) BP: (113-147)/(63-73) 137/73 (07/14 0731) SpO2:  [98 %-99 %] 99 % (07/14 0731)     Height: 5\' 5"  (165.1 cm) Weight: 119.3 kg BMI (Calculated): 43.77   Intake/Output last 2 shifts:  07/13 0701 - 07/14 0700 In: 0  Out: 200 [Urine:200]   Physical Exam:  Constitutional: alert, cooperative and no distress  HENT: normocephalic without obvious abnormality  Eyes: PERRL, EOM's grossly intact and symmetric  Respiratory: breathing non-labored at rest  Cardiovascular: regular rate and sinus rhythm  Gastrointestinal: soft, mild epigastric tenderness, and non-distended. No rebound/guarding. Negative Murphy's. No peritonitis Musculoskeletal: no edema or wounds, motor and sensation grossly intact, NT    Labs:  CBC Latest Ref Rng & Units 06/21/2019 06/20/2019 06/19/2019  WBC 4.0 - 10.5 K/uL 8.5 9.5 13.1(H)  Hemoglobin 12.0 - 15.0 g/dL 11.9(L) 12.7 12.4  Hematocrit 36.0 - 46.0 % 37.8 40.6 39.6  Platelets 150 - 400 K/uL 404(H) 459(H) 475(H)   CMP Latest Ref Rng & Units 06/21/2019  06/20/2019 06/19/2019  Glucose 70 - 99 mg/dL 120(H) 145(H) 160(H)  BUN 6 - 20 mg/dL 20 15 20   Creatinine 0.44 - 1.00 mg/dL 1.84(H) 1.25(H) 1.54(H)  Sodium 135 - 145 mmol/L 138 138 141  Potassium 3.5 - 5.1 mmol/L 3.8 3.9 3.9  Chloride 98 - 111 mmol/L 106 106 106  CO2 22 - 32 mmol/L 24 24 23   Calcium 8.9 - 10.3 mg/dL 7.9(L) 8.5(L) 9.0  Total Protein 6.5 - 8.1 g/dL 5.9(L) - -  Total Bilirubin 0.3 - 1.2 mg/dL 0.4 - -  Alkaline Phos 38 - 126 U/L 73 - -  AST 15 - 41 U/L 13(L) - -  ALT 0 - 44 U/L 10 - -     Imaging studies: No new pertinent imaging studies   Assessment/Plan: (ICD-10's: K59.20) 38 y.o. female with improving nausea who was admitted for intractable nausea/emesis of unknown etiology and subsequently found to have cholelithiasis without evidence of cholecystitis, which is unlikely the source of her symptoms given history of gastroparesis, which is complicated by pertinent comorbidities including CAD s/p PCI most recently in February 2020 on PLavix.   - Advanced to soft diet this morning             - pain control prn; antiemetics prn             - monitor abdominal examination  - Appreciate GI recommendations; continue work up             - Again, given comorbidities, will defer cholecystectomy at this time and continue to work up nausea/emesis. She can follow up outpatient for evaluation and discussing interval cholecystectomy, which she  is agreeable with              - Appreciate cardiology input and risk stratification             - mobilization encouraged              - further management per primary team   - No acute/emergent surgical needs identified, general surgery will sign off. Please re-consult if new issues arise. Patient can follow up outpatient with Dr Dahlia Byes in a few weeks.   All of the above findings and recommendations were discussed with the patient, and the medical team, and all of patient's questions were answered to her expressed satisfaction.  -- Edison Simon, PA-C [facility] 06/22/2019, 7:32 AM [phone] M-F: 7am - 4pm

## 2019-06-23 ENCOUNTER — Encounter: Payer: Self-pay | Admitting: *Deleted

## 2019-06-23 ENCOUNTER — Telehealth: Payer: Self-pay

## 2019-06-23 DIAGNOSIS — Z955 Presence of coronary angioplasty implant and graft: Secondary | ICD-10-CM

## 2019-06-23 NOTE — Progress Notes (Signed)
Cardiac Individual Treatment Plan  Patient Details  Name: [name] [name] MRN: [id number] Date of Birth: Apr 17, 1981 Referring Provider:     Cardiac Rehab from 01/21/2019 in [facility] Cardiac and Pulmonary Rehab  Referring Provider  End, Christiopher MD [Attending Cardiologist: Dr. Rogue Jury Arida]      Initial Encounter Date:    Cardiac Rehab from 01/21/2019 in [facility] Cardiac and Pulmonary Rehab  Date  01/21/19      Visit Diagnosis: Status post coronary artery stent placement  Patient's Home Medications on Admission:  Current Outpatient Medications:  .  amitriptyline (ELAVIL) 25 MG tablet, Take 25 mg by mouth at bedtime., Disp: , Rfl:  .  aspirin EC 81 MG tablet, Take 81 mg by mouth daily., Disp: , Rfl:  .  Cholecalciferol 2000 units TABS, Take 2,000 Units by mouth daily. , Disp: , Rfl:  .  ciprofloxacin (CIPRO) 500 MG tablet, Take 1 tablet (500 mg total) by mouth 2 (two) times daily for 3 days., Disp: 6 tablet, Rfl: 0 .  clopidogrel (PLAVIX) 75 MG tablet, Take 1 tablet (75 mg total) by mouth daily., Disp: 30 tablet, Rfl: 0 .  DULoxetine (CYMBALTA) 30 MG capsule, Take 60 mg by mouth daily. , Disp: , Rfl: 3 .  etonogestrel (NEXPLANON) 68 MG IMPL implant, 1 each by Subdermal route once., Disp: , Rfl:  .  FARXIGA 10 MG TABS tablet, Take 10 mg by mouth daily. , Disp: , Rfl: 11 .  furosemide (LASIX) 40 MG tablet, Take 1 tablet (40 mg total) by mouth every other day., Disp: 30 tablet, Rfl: 5 .  glimepiride (AMARYL) 4 MG tablet, TAKE 1 TABLET BY MOUTH ONCE DAILY (Patient taking differently: Take 4 mg by mouth daily. ), Disp: 90 tablet, Rfl: 1 .  insulin aspart (NOVOLOG) 100 UNIT/ML injection, Inject 10 Units into the skin 3 (three) times daily with meals., Disp: 10 mL, Rfl: 11 .  insulin glargine (LANTUS) 100 UNIT/ML injection, Inject 60 Units into the skin daily., Disp: , Rfl:  .  irbesartan (AVAPRO) 75 MG tablet, Take 1 tablet (75 mg total) by mouth daily., Disp: 30 tablet, Rfl: 6 .  isosorbide  mononitrate (IMDUR) 30 MG 24 hr tablet, Take 2 tablets (60 mg total) by mouth daily., Disp: 90 tablet, Rfl: 3 .  loperamide (IMODIUM A-D) 2 MG tablet, Take 8-12 mg by mouth daily as needed for diarrhea or loose stools., Disp: , Rfl:  .  meclizine (ANTIVERT) 25 MG tablet, Take 25 mg by mouth 3 (three) times daily as needed for dizziness., Disp: , Rfl:  .  metoCLOPramide (REGLAN) 10 MG tablet, Take 1 tablet (10 mg total) by mouth 3 (three) times daily with meals for 10 days., Disp: 30 tablet, Rfl: 0 .  metoprolol tartrate (LOPRESSOR) 100 MG tablet, Take 1 tablet (100 mg total) by mouth 2 (two) times daily., Disp: 60 tablet, Rfl: 6 .  mirtazapine (REMERON) 30 MG tablet, Take 60 mg by mouth daily. , Disp: , Rfl: 3 .  nitroGLYCERIN (NITROSTAT) 0.4 MG SL tablet, Take 1 tab under your tongue while sitting for chest pain, If no relief may repeat, one tab every 5 min up to 3 tabs total over 15 mins., Disp: 25 tablet, Rfl: 3 .  ondansetron (ZOFRAN) 4 MG tablet, Take 1 tablet (4 mg total) by mouth every 8 (eight) hours as needed for nausea or vomiting., Disp: 8 tablet, Rfl: 0 .  pantoprazole (PROTONIX) 40 MG tablet, Take 1 tablet (40 mg total) by mouth 2 (two)  times daily., Disp: 60 tablet, Rfl: 0 .  polyethylene glycol (MIRALAX / GLYCOLAX) 17 g packet, Take 17 g by mouth daily as needed. , Disp: , Rfl:   Past Medical History: Past Medical History:  Diagnosis Date  . CAD (coronary artery disease)    a. 03/2015 NSTEMI/PCI: LCX 11m(DES), OM1 100p (DES - vessel labeled Ramus in subsequent cath report). Minor irregs to LAD/RCA; b. 04/2015 Cath: LM nl, LAD 40p, RI patent stent, LCX patent stent, RCA nl, EF 55-65%; c. 02/2016 MV: basal inferolateral and mid inferolateral defect/infarct w/o ischemia. EF 55-65%; d. 11/2018 PCI: LM 30ost, LAD 30p, LCX 95p/m (PTCA->20%), OM1 10ost, OM2 40ost, RCA 85p.  .Marland KitchenChronic abdominal pain   . Chronic diastolic (congestive) heart failure ([place]    a. 03/2015 Echo: EF 30-35%, mild  concentric LVH, severe HK of inf, inflat, & lat walls, mod MR, mild TR;  b. 04/2015 LV Gram: EF 55-65%; c. 09/2017 Echo: EF 55-60%, no rwma. Mild MR; d. 11/2018 Echo: Ef 60-65%, no rwma.  . CKD (chronic kidney disease), stage III [place]   . Diabetes type 2, controlled ([place]    a. since 2002   . Diabetic gastroparesis (HCC)    a. chronic nausea/vomiting.  . Diverticulosis, sigmoid 07/2016  . Heavy menses    a. H/O IUD - expired in 2014 - remains in place.  . Hyperlipidemia   . Hypertension   . Iron deficiency anemia   . Ischemic cardiomyopathy (resolved)    a. 03/2015 EF 30-35% post NSTEMI;  b. 04/2015 EF 55-65% on LV gram; c. 09/2017 Echo: EF 55-60%; d. 11/2018 Echo: Ef 60-65%.  . Obesity   . Tobacco abuse    a. quit 03/2015.  .Marland KitchenUterine fibroid   . Vertigo   . Vitamin D deficiency     Tobacco Use: Social History   Tobacco Use  Smoking Status Former Smoker  . Years: 15.00  . Quit date: 03/10/2015  . Years since quitting: 4.2  Smokeless Tobacco Never Used    Labs: Recent Review Flowsheet Data    Labs for ITP Cardiac and Pulmonary Rehab Latest Ref Rng & Units 01/22/2018 10/06/2018 12/17/2018 04/16/2019 04/21/2019   Cholestrol 100 - 199 mg/dL - 236(H) 245(H) - 307(H)   LDLCALC 0 - 99 mg/dL - 163(H) 165(H) - 237(H)   LDLDIRECT 0 - 99 mg/dL - - 163(H) - -   HDL >39 mg/dL - 39(L) 53 - 40   Trlycerides 0 - 149 mg/dL - 171(H) 137 - 149   Hemoglobin A1c 4.8 - 5.6 % 11.5(H) - - - 9.5(H)   HCO3 20.0 - 28.0 mmol/L - - - 23.3 -   O2SAT % - - - 90.3 -       Exercise Target Goals: Exercise Program Goal: Individual exercise prescription set using results from initial 6 min walk test and THRR while considering  patient's activity barriers and safety.   Exercise Prescription Goal: Initial exercise prescription builds to 30-45 minutes a day of aerobic activity, 2-3 days per week.  Home exercise guidelines will be given to patient during program as part of exercise prescription that the  participant will acknowledge.  Activity Barriers & Risk Stratification: Activity Barriers & Cardiac Risk Stratification - 01/21/19 1355      Activity Barriers & Cardiac Risk Stratification   Activity Barriers  Deconditioning;Muscular Weakness;Shortness of Breath;Balance Concerns;Back Problems;Joint Problems;Decreased Ventricular Function   knees and back can be very painful (chronic), occasional dizzy spells   Cardiac Risk  Stratification  High       6 Minute Walk: 6 Minute Walk    Row Name 01/21/19 1354         6 Minute Walk   Phase  Initial     Distance  850 feet     Walk Time  6 minutes     MPH  1.61     METS  3.5     RPE  12     Perceived Dyspnea   1     VO2 Peak  12.25     Symptoms  Yes (comment)     Comments  legs tired and painful 9/10     Resting HR  103 bpm     Resting BP  126/74     Resting Oxygen Saturation   96 %     Exercise Oxygen Saturation  during 6 min walk  97 %     Max Ex. HR  135 bpm     Max Ex. BP  156/74     2 Minute Post BP  130/70        Oxygen Initial Assessment:   Oxygen Re-Evaluation:   Oxygen Discharge (Final Oxygen Re-Evaluation):   Initial Exercise Prescription: Initial Exercise Prescription - 01/21/19 1300      Date of Initial Exercise RX and Referring Provider   Date  01/21/19    Referring Provider  End, Christiopher MD   Attending Cardiologist: Dr. Kathlyn [place]     Treadmill   MPH  1.4    Grade  0.5    Minutes  15    METs  2.17      REL-XR   Level  1    Speed  50    Minutes  15    METs  2.5      T5 Nustep   Level  2    SPM  80    Minutes  15    METs  2.5      Prescription Details   Frequency (times per week)  3    Duration  Progress to 45 minutes of aerobic exercise without signs/symptoms of physical distress      Intensity   THRR 40-80% of Max Heartrate  135-166    Ratings of Perceived Exertion  11-13    Perceived Dyspnea  0-4      Progression   Progression  Continue to progress workloads to  maintain intensity without signs/symptoms of physical distress.      Resistance Training   Training Prescription  Yes    Weight  3 lbs    Reps  10-15       Perform Capillary Blood Glucose checks as needed.  Exercise Prescription Changes: Exercise Prescription Changes    Row Name 01/21/19 1300             Response to Exercise   Blood Pressure (Admit)  126/74       Blood Pressure (Exercise)  156/74       Blood Pressure (Exit)  130/70       Heart Rate (Admit)  103 bpm       Heart Rate (Exercise)  135 bpm       Heart Rate (Exit)  108 bpm       Oxygen Saturation (Admit)  96 %       Oxygen Saturation (Exercise)  97 %       Rating of Perceived Exertion (Exercise)  12       Perceived  Dyspnea (Exercise)  1       Symptoms  legs tired and pain 9/10, SOB       Comments  walk test results          Exercise Comments: Exercise Comments    Row Name 02/01/19 1737 02/23/19 1148         Exercise Comments   First full day of exercise!  Patient was oriented to gym and equipment including functions, settings, policies, and procedures.  Patient's individual exercise prescription and treatment plan were reviewed.  All starting workloads were established based on the results of the 6 minute walk test done at initial orientation visit.  The plan for exercise progression was also introduced and progression will be customized based on patient's performance and goals.  Reviewed home exercise with pt today.  Reviewed THR, pulse, RPE, sign and symptoms, NTG use, and when to call 911 or MD.  Also discussed weather considerations and indoor options.  Pt voiced understanding.         Exercise Goals and Review: Exercise Goals    Row Name 01/21/19 1406             Exercise Goals   Increase Physical Activity  Yes       Intervention  Provide advice, education, support and counseling about physical activity/exercise needs.;Develop an individualized exercise prescription for aerobic and resistive  training based on initial evaluation findings, risk stratification, comorbidities and participant's personal goals.       Expected Outcomes  Short Term: Attend rehab on a regular basis to increase amount of physical activity.;Long Term: Exercising regularly at least 3-5 days a week.;Long Term: Add in home exercise to make exercise part of routine and to increase amount of physical activity.       Increase Strength and Stamina  Yes       Intervention  Provide advice, education, support and counseling about physical activity/exercise needs.;Develop an individualized exercise prescription for aerobic and resistive training based on initial evaluation findings, risk stratification, comorbidities and participant's personal goals.       Expected Outcomes  Short Term: Increase workloads from initial exercise prescription for resistance, speed, and METs.;Short Term: Perform resistance training exercises routinely during rehab and add in resistance training at home;Long Term: Improve cardiorespiratory fitness, muscular endurance and strength as measured by increased METs and functional capacity (6MWT)       Able to understand and use rate of perceived exertion (RPE) scale  Yes       Intervention  Provide education and explanation on how to use RPE scale       Expected Outcomes  Short Term: Able to use RPE daily in rehab to express subjective intensity level;Long Term:  Able to use RPE to guide intensity level when exercising independently       Knowledge and understanding of Target Heart Rate Range (THRR)  Yes       Intervention  Provide education and explanation of THRR including how the numbers were predicted and where they are located for reference       Expected Outcomes  Short Term: Able to state/look up THRR;Short Term: Able to use daily as guideline for intensity in rehab;Long Term: Able to use THRR to govern intensity when exercising independently       Able to check pulse independently  Yes        Intervention  Provide education and demonstration on how to check pulse in carotid and radial arteries.;Review the importance of  being able to check your own pulse for safety during independent exercise       Expected Outcomes  Short Term: Able to explain why pulse checking is important during independent exercise;Long Term: Able to check pulse independently and accurately       Understanding of Exercise Prescription  Yes       Intervention  Provide education, explanation, and written materials on patient's individual exercise prescription       Expected Outcomes  Short Term: Able to explain program exercise prescription;Long Term: Able to explain home exercise prescription to exercise independently          Exercise Goals Re-Evaluation : Exercise Goals Re-Evaluation    Row Name 02/01/19 1737 02/23/19 1146           Exercise Goal Re-Evaluation   Exercise Goals Review  Increase Physical Activity;Increase Strength and Stamina;Able to understand and use rate of perceived exertion (RPE) scale;Able to understand and use Dyspnea scale  Increase Physical Activity;Increase Strength and Stamina;Able to understand and use rate of perceived exertion (RPE) scale;Knowledge and understanding of Target Heart Rate Range (THRR);Able to check pulse independently;Understanding of Exercise Prescription      Comments  Reviewed RPE scale, THR and program prescription with pt today.  Pt voiced understanding and was given a copy of goals to take home.   Reviewed home exercise with pt today.  Pt plans to use gym at apartment complex for exercise.  Reviewed THR, pulse, RPE, sign and symptoms, NTG use, and when to call 911 or MD.  Also discussed weather considerations and indoor options.  Pt voiced understanding.  HT classes ar enot meeting at this time due to [place] 19.        Expected Outcomes  Short: Use RPE daily to regulate intensity. Long: Follow program prescription in THR.  Short - exercise on her own while missing  classes Long - improve overall MET level         Discharge Exercise Prescription (Final Exercise Prescription Changes): Exercise Prescription Changes - 01/21/19 1300      Response to Exercise   Blood Pressure (Admit)  126/74    Blood Pressure (Exercise)  156/74    Blood Pressure (Exit)  130/70    Heart Rate (Admit)  103 bpm    Heart Rate (Exercise)  135 bpm    Heart Rate (Exit)  108 bpm    Oxygen Saturation (Admit)  96 %    Oxygen Saturation (Exercise)  97 %    Rating of Perceived Exertion (Exercise)  12    Perceived Dyspnea (Exercise)  1    Symptoms  legs tired and pain 9/10, SOB    Comments  walk test results       Nutrition:  Target Goals: Understanding of nutrition guidelines, daily intake of sodium <1541m, cholesterol <2042m calories 30% from fat and 7% or less from saturated fats, daily to have 5 or more servings of fruits and vegetables.  Biometrics: Pre Biometrics - 01/21/19 1407      Pre Biometrics   Height  5' 6.4" (1.687 m)    Weight  275 lb 12.8 oz (125.1 kg)    Waist Circumference  50 inches    Hip Circumference  52 inches    Waist to Hip Ratio  0.96 %    BMI (Calculated)  43.96    Single Leg Stand  4.58 seconds        Nutrition Therapy Plan and Nutrition Goals: Nutrition Therapy & Goals -  02/24/19 1241      Nutrition Therapy   Diet  Diabetes Heart Healthy Low Sodium       Nutrition Assessments: Nutrition Assessments - 01/21/19 1429      MEDFICTS Scores   Pre Score  24       Nutrition Goals Re-Evaluation:   Nutrition Goals Discharge (Final Nutrition Goals Re-Evaluation):   Psychosocial: Target Goals: Acknowledge presence or absence of significant depression and/or stress, maximize coping skills, provide positive support system. Participant is able to verbalize types and ability to use techniques and skills needed for reducing stress and depression.   Initial Review & Psychosocial Screening: Initial Psych Review & Screening - 01/21/19  1426      Initial Review   Current issues with  Current Depression;Current Anxiety/Panic;Current Stress Concerns    Source of Stress Concerns  Chronic Illness;Unable to perform yard/household activities;Unable to participate in former interests or hobbies;Family    Comments  Solae reports her quality of life is low due to inability to participate in some daily activities and hobbies related to her lack of stamina. She said her husband, who is a Administrator, and her 18 year old daughter are very supportive. She wants to work on her energy level while here      [place]?  Yes   husband, daughter, family     Barriers   Psychosocial barriers to participate in program  There are no identifiable barriers or psychosocial needs.;The patient should benefit from training in stress management and relaxation.      Screening Interventions   Interventions  Encouraged to exercise;Program counselor consult;To provide support and resources with identified psychosocial needs;Provide feedback about the scores to participant    Expected Outcomes  Short Term goal: Utilizing psychosocial counselor, staff and physician to assist with identification of specific Stressors or current issues interfering with healing process. Setting desired goal for each stressor or current issue identified.;Long Term Goal: Stressors or current issues are controlled or eliminated.;Short Term goal: Identification and review with participant of any Quality of Life or Depression concerns found by scoring the questionnaire.;Long Term goal: The participant improves quality of Life and PHQ9 Scores as seen by post scores and/or verbalization of changes       Quality of Life Scores:  Quality of Life - 01/21/19 1428      Quality of Life   Select  Quality of Life      Quality of Life Scores   Health/Function Pre  3.2 %    Socioeconomic Pre  15.43 %    Psych/Spiritual Pre  3.43 %    Family Pre  20.4 %     GLOBAL Pre  8.23 %      Scores of 19 and below usually indicate a poorer quality of life in these areas.  A difference of  2-3 points is a clinically meaningful difference.  A difference of 2-3 points in the total score of the Quality of Life Index has been associated with significant improvement in overall quality of life, self-image, physical symptoms, and general health in studies assessing change in quality of life.  PHQ-9: Recent Review Flowsheet Data    Depression screen [facility] 2/9 01/21/2019 10/06/2018 01/09/2018 08/18/2017 08/12/2017   Decreased Interest 3 0 0 1 2   Down, Depressed, Hopeless _0 PHQ - 2 Score _1 Altered sleeping _2 - 3   Tired, decreased  energy _0 - 2   Change in appetite _1 - 2   Feeling bad or failure about yourself  _2 - 2   Trouble concentrating _3 - 2   Moving slowly or fidgety/restless 2 0 0 - 0   Suicidal thoughts 0 0 2 0 0   PHQ-9 Score _4 - 15   Difficult doing work/chores Extremely dIfficult Very difficult - - -     Interpretation of Total Score  Total Score Depression Severity:  1-4 = Minimal depression, 5-9 = Mild depression, 10-14 = Moderate depression, 15-19 = Moderately severe depression, 20-27 = Severe depression   Psychosocial Evaluation and Intervention: Psychosocial Evaluation - 03/01/19 1043      Psychosocial Evaluation & Interventions   Comments  Chaplain attempted to reach patient to conduct initial psychosocial evaluation.  Message left requesting call back from patient.    Expected Outcomes  Counselor will continue to attempt to reach patient.    Continue Psychosocial Services   Follow up required by counselor       Psychosocial Re-Evaluation:   Psychosocial Discharge (Final Psychosocial Re-Evaluation):   Vocational Rehabilitation: Provide vocational rehab assistance to qualifying candidates.   Vocational Rehab Evaluation & Intervention: Vocational Rehab - 01/21/19 1426      Initial Vocational  Rehab Evaluation & Intervention   Assessment shows need for Vocational Rehabilitation  No       Education: Education Goals: Education classes will be provided on a variety of topics geared toward better understanding of heart health and risk factor modification. Participant will state understanding/return demonstration of topics presented as noted by education test scores.  Learning Barriers/Preferences: Learning Barriers/Preferences - 01/21/19 1425      Learning Barriers/Preferences   Learning Barriers  None    Learning Preferences  None       Education Topics:  AED/CPR: - Group verbal and written instruction with the use of models to demonstrate the basic use of the AED with the basic ABC's of resuscitation.   Cardiac Rehab from 02/01/2019 in [facility] Cardiac and Pulmonary Rehab  Date  02/01/19  Educator  CE  Instruction Review Code  1- Verbalizes Understanding      General Nutrition Guidelines/Fats and Fiber: -Group instruction provided by verbal, written material, models and posters to present the general guidelines for heart healthy nutrition. Gives an explanation and review of dietary fats and fiber.   Controlling Sodium/Reading Food Labels: -Group verbal and written material supporting the discussion of sodium use in heart healthy nutrition. Review and explanation with models, verbal and written materials for utilization of the food label.   Cardiac Rehab from 08/02/2015 in [facility] Cardiac and Pulmonary Rehab  Date  07/31/15  Educator  PI  Instruction Review Code (retired)  2- meets goals/outcomes      Exercise Physiology & General Exercise Guidelines: - Group verbal and written instruction with models to review the exercise physiology of the cardiovascular system and associated critical values. Provides general exercise guidelines with specific guidelines to those with heart or lung disease.    Aerobic Exercise & Resistance Training: - Gives group verbal and written  instruction on the various components of exercise. Focuses on aerobic and resistive training programs and the benefits of this training and how to safely progress through these programs..   Flexibility, Balance, Mind/Body Relaxation: Provides group verbal/written instruction on the benefits of flexibility and balance training, including mind/body exercise modes such as yoga, pilates and tai  chi.  Demonstration and skill practice provided.   Stress and Anxiety: - Provides group verbal and written instruction about the health risks of elevated stress and causes of high stress.  Discuss the correlation between heart/lung disease and anxiety and treatment options. Review healthy ways to manage with stress and anxiety.   Depression: - Provides group verbal and written instruction on the correlation between heart/lung disease and depressed mood, treatment options, and the stigmas associated with seeking treatment.   Cardiac Rehab from 08/02/2015 in [facility] Cardiac and Pulmonary Rehab  Date  08/02/15  Educator  [facility]  Instruction Review Code (retired)  2- meets Designer, fashion/clothing & Physiology of the Heart: - Group verbal and written instruction and models provide basic cardiac anatomy and physiology, with the coronary electrical and arterial systems. Review of Valvular disease and Heart Failure   Cardiac Procedures: - Group verbal and written instruction to review commonly prescribed medications for heart disease. Reviews the medication, class of the drug, and side effects. Includes the steps to properly store meds and maintain the prescription regimen. (beta blockers and nitrates)   Cardiac Medications I: - Group verbal and written instruction to review commonly prescribed medications for heart disease. Reviews the medication, class of the drug, and side effects. Includes the steps to properly store meds and maintain the prescription regimen.   Cardiac Medications II: -Group verbal and  written instruction to review commonly prescribed medications for heart disease. Reviews the medication, class of the drug, and side effects. (all other drug classes)    Go Sex-Intimacy & Heart Disease, Get SMART - Goal Setting: - Group verbal and written instruction through game format to discuss heart disease and the return to sexual intimacy. Provides group verbal and written material to discuss and apply goal setting through the application of the S.M.A.R.T. Method.   Other Matters of the Heart: - Provides group verbal, written materials and models to describe Stable Angina and Peripheral Artery. Includes description of the disease process and treatment options available to the cardiac patient.   Exercise & Equipment Safety: - Individual verbal instruction and demonstration of equipment use and safety with use of the equipment.   Cardiac Rehab from 02/01/2019 in [facility] Cardiac and Pul[facility] Rehab  Date  01/21/19  Educator  [facility]  Instruction Review Code  1- Verbalizes Understanding      Infection Prevention: - Provides verbal and written material to individual with discussion of infection control including proper hand washing and proper equipment cleaning during exercise session.   Cardiac Rehab from 02/01/2019 in [facility] Cardiac and Pulmonary Rehab  Date  01/21/19  Educator  [facility]  Instruction Review Code  1- Verbalizes Understanding      Falls Prevention: - Provides verbal and written material to individual with discussion of falls prevention and safety.   Cardiac Rehab from 02/01/2019 in [facility] Cardiac and Pulmonary Rehab  Date  01/21/19  Educator  [facility]  Instruction Review Code  1- Verbalizes Understanding      Diabetes: - Individual verbal and written instruction to review signs/symptoms of diabetes, desired ranges of glucose level fasting, after meals and with exercise. Acknowledge that pre and post exercise glucose checks will be done for 3 sessions at entry of program.   Cardiac  Rehab from 02/01/2019 in [facility] Cardiac and Pulmonary Rehab  Date  01/21/19  Educator  [facility]  Instruction Review Code  1- Verbalizes Understanding      Know Your Numbers and Risk Factors: -Group verbal  and written instruction about important numbers in your health.  Discussion of what are risk factors and how they play a role in the disease process.  Review of Cholesterol, Blood Pressure, Diabetes, and BMI and the role they play in your overall health.   Sleep Hygiene: -Provides group verbal and written instruction about how sleep can affect your health.  Define sleep hygiene, discuss sleep cycles and impact of sleep habits. Review good sleep hygiene tips.    Other: -Provides group and verbal instruction on various topics (see comments)   Knowledge Questionnaire Score: Knowledge Questionnaire Score - 01/21/19 1425      Knowledge Questionnaire Score   Pre Score  24/26   correct answers reviewed with [place]. Focus on PAD and Nutrition       Core Components/Risk Factors/Patient Goals at Admission: Personal Goals and Risk Factors at Admission - 01/21/19 1424      Core Components/Risk Factors/Patient Goals on Admission    Weight Management  Yes;Obesity;Weight Loss    Intervention  Weight Management: Develop a combined nutrition and exercise program designed to reach desired caloric intake, while maintaining appropriate intake of nutrient and fiber, sodium and fats, and appropriate energy expenditure required for the weight goal.;Weight Management: Provide education and appropriate resources to help participant work on and attain dietary goals.;Weight Management/Obesity: Establish reasonable short term and long term weight goals.;Obesity: Provide education and appropriate resources to help participant work on and attain dietary goals.    Admit Weight  275 lb 12.8 oz (125.1 kg)    Goal Weight: Short Term  270 lb (122.5 kg)    Goal Weight: Long Term  140 lb (63.5 kg)    Expected Outcomes   Short Term: Continue to assess and modify interventions until short term weight is achieved;Long Term: Adherence to nutrition and physical activity/exercise program aimed toward attainment of established weight goal;Weight Loss: Understanding of general recommendations for a balanced deficit meal plan, which promotes 1-2 lb weight loss per week and includes a negative energy balance of [phone] kcal/d;Understanding recommendations for meals to include 15-35% energy as protein, 25-35% energy from fat, 35-60% energy from carbohydrates, less than 290m of dietary cholesterol, 20-35 gm of total fiber daily;Understanding of distribution of calorie intake throughout the day with the consumption of 4-5 meals/snacks    Diabetes  Yes    Intervention  Provide education about signs/symptoms and action to take for hypo/hyperglycemia.;Provide education about proper nutrition, including hydration, and aerobic/resistive exercise prescription along with prescribed medications to achieve blood glucose in normal ranges: Fasting glucose 65-99 mg/dL    Expected Outcomes  Short Term: Participant verbalizes understanding of the signs/symptoms and immediate care of hyper/hypoglycemia, proper foot care and importance of medication, aerobic/resistive exercise and nutrition plan for blood glucose control.;Long Term: Attainment of HbA1C < 7%.    Heart Failure  Yes    Intervention  Provide a combined exercise and nutrition program that is supplemented with education, support and counseling about heart failure. Directed toward relieving symptoms such as shortness of breath, decreased exercise tolerance, and extremity edema.    Expected Outcomes  Improve functional capacity of life;Short term: Attendance in program 2-3 days a week with increased exercise capacity. Reported lower sodium intake. Reported increased fruit and vegetable intake. Reports medication compliance.;Short term: Daily weights obtained and reported for increase.  Utilizing diuretic protocols set by physician.;Long term: Adoption of self-care skills and reduction of barriers for early signs and symptoms recognition and intervention leading to self-care maintenance.    Hypertension  Yes    Intervention  Provide education on lifestyle modifcations including regular physical activity/exercise, weight management, moderate sodium restriction and increased consumption of fresh fruit, vegetables, and low fat dairy, alcohol moderation, and smoking cessation.;Monitor prescription use compliance.    Expected Outcomes  Short Term: Continued assessment and intervention until BP is < 140/79m HG in hypertensive participants. < 130/810mHG in hypertensive participants with diabetes, heart failure or chronic kidney disease.;Long Term: Maintenance of blood pressure at goal levels.    Lipids  Yes    Intervention  Provide education and support for participant on nutrition & aerobic/resistive exercise along with prescribed medications to achieve LDL <7076mHDL >93m26m  Expected Outcomes  Short Term: Participant states understanding of desired cholesterol values and is compliant with medications prescribed. Participant is following exercise prescription and nutrition guidelines.;Long Term: Cholesterol controlled with medications as prescribed, with individualized exercise RX and with personalized nutrition plan. Value goals: LDL < 70mg20mL > 40 mg.       Core Components/Risk Factors/Patient Goals Review:  Goals and Risk Factor Review    Row Name 02/23/19 1149             Core Components/Risk Factors/Patient Goals Review   Personal Goals Review  Diabetes;Hypertension;Other       Review  FerniIvoriean infusion tomorrow due to anemia.  She is taking all meds as directed and checking BG and BP at home.  They have been in normal range.  She is walking up and down stairs at her home foe exercise.  She states she has a lot of pain but wants to move.  She will consult with Dr  tomorrow about level of exercise considering infusion.       Expected Outcomes  Short - check with Dr about exercise due to infusion Long - improve exercise level          Core Components/Risk Factors/Patient Goals at Discharge (Final Review):  Goals and Risk Factor Review - 02/23/19 1149      Core Components/Risk Factors/Patient Goals Review   Personal Goals Review  Diabetes;Hypertension;Other    Review  FerniPurityan infusion tomorrow due to anemia.  She is taking all meds as directed and checking BG and BP at home.  They have been in normal range.  She is walking up and down stairs at her home foe exercise.  She states she has a lot of pain but wants to move.  She will consult with Dr tomorrow about level of exercise considering infusion.    Expected Outcomes  Short - check with Dr about exercise due to infusion Long - improve exercise level       ITP Comments: ITP Comments    Row Name 02/03/19 0620 02/26/19 0809 03/03/19 1304 03/03/19 1308 06/23/19 1116   ITP Comments  30 day review. Continue with ITP unless directed changes by Medical Director chart review. New to program  Our program is currently closed due to COVID-19.  We are communicating with patient via phone calls and emails.   30 day review. Continue with ITP unless directed changes by Medical Director chart review.   30 day review. Continue with ITP unless directed changes by Medical Director chart review.   30 day review cycle restarting  after being closed since March 16 because of  Covid 19 pandemic. Program opened to patients on July 6. Not all have returned. ITP updated and sent to Medical Director for review,changes as needed and signature  Comments:

## 2019-06-23 NOTE — Telephone Encounter (Signed)
I have made the 1st attempt to contact the patient or family member in charge, in order to follow up from recently being discharged from the hospital. I left a message on voicemail but I will make another attempt at a different time.  

## 2019-06-23 NOTE — Progress Notes (Signed)
Cardiac Individual Treatment Plan  Patient Details  Name: [name] [name] MRN: [id number] Date of Birth: 02-18-1981 Referring Provider:     Cardiac Rehab from 01/21/2019 in [facility] Cardiac and Pulmonary Rehab  Referring Provider  End, Christiopher MD [Attending Cardiologist: Dr. Rogue Jury Arida]      Initial Encounter Date:    Cardiac Rehab from 01/21/2019 in [facility] Cardiac and Pulmonary Rehab  Date  01/21/19      Visit Diagnosis: Status post coronary artery stent placement  Patient's Home Medications on Admission:  Current Outpatient Medications:  .  amitriptyline (ELAVIL) 25 MG tablet, Take 25 mg by mouth at bedtime., Disp: , Rfl:  .  aspirin EC 81 MG tablet, Take 81 mg by mouth daily., Disp: , Rfl:  .  Cholecalciferol 2000 units TABS, Take 2,000 Units by mouth daily. , Disp: , Rfl:  .  ciprofloxacin (CIPRO) 500 MG tablet, Take 1 tablet (500 mg total) by mouth 2 (two) times daily for 3 days., Disp: 6 tablet, Rfl: 0 .  clopidogrel (PLAVIX) 75 MG tablet, Take 1 tablet (75 mg total) by mouth daily., Disp: 30 tablet, Rfl: 0 .  DULoxetine (CYMBALTA) 30 MG capsule, Take 60 mg by mouth daily. , Disp: , Rfl: 3 .  etonogestrel (NEXPLANON) 68 MG IMPL implant, 1 each by Subdermal route once., Disp: , Rfl:  .  FARXIGA 10 MG TABS tablet, Take 10 mg by mouth daily. , Disp: , Rfl: 11 .  furosemide (LASIX) 40 MG tablet, Take 1 tablet (40 mg total) by mouth every other day., Disp: 30 tablet, Rfl: 5 .  glimepiride (AMARYL) 4 MG tablet, TAKE 1 TABLET BY MOUTH ONCE DAILY (Patient taking differently: Take 4 mg by mouth daily. ), Disp: 90 tablet, Rfl: 1 .  insulin aspart (NOVOLOG) 100 UNIT/ML injection, Inject 10 Units into the skin 3 (three) times daily with meals., Disp: 10 mL, Rfl: 11 .  insulin glargine (LANTUS) 100 UNIT/ML injection, Inject 60 Units into the skin daily., Disp: , Rfl:  .  irbesartan (AVAPRO) 75 MG tablet, Take 1 tablet (75 mg total) by mouth daily., Disp: 30 tablet, Rfl: 6 .  isosorbide  mononitrate (IMDUR) 30 MG 24 hr tablet, Take 2 tablets (60 mg total) by mouth daily., Disp: 90 tablet, Rfl: 3 .  loperamide (IMODIUM A-D) 2 MG tablet, Take 8-12 mg by mouth daily as needed for diarrhea or loose stools., Disp: , Rfl:  .  meclizine (ANTIVERT) 25 MG tablet, Take 25 mg by mouth 3 (three) times daily as needed for dizziness., Disp: , Rfl:  .  metoCLOPramide (REGLAN) 10 MG tablet, Take 1 tablet (10 mg total) by mouth 3 (three) times daily with meals for 10 days., Disp: 30 tablet, Rfl: 0 .  metoprolol tartrate (LOPRESSOR) 100 MG tablet, Take 1 tablet (100 mg total) by mouth 2 (two) times daily., Disp: 60 tablet, Rfl: 6 .  mirtazapine (REMERON) 30 MG tablet, Take 60 mg by mouth daily. , Disp: , Rfl: 3 .  nitroGLYCERIN (NITROSTAT) 0.4 MG SL tablet, Take 1 tab under your tongue while sitting for chest pain, If no relief may repeat, one tab every 5 min up to 3 tabs total over 15 mins., Disp: 25 tablet, Rfl: 3 .  ondansetron (ZOFRAN) 4 MG tablet, Take 1 tablet (4 mg total) by mouth every 8 (eight) hours as needed for nausea or vomiting., Disp: 8 tablet, Rfl: 0 .  pantoprazole (PROTONIX) 40 MG tablet, Take 1 tablet (40 mg total) by mouth 2 (two)  times daily., Disp: 60 tablet, Rfl: 0 .  polyethylene glycol (MIRALAX / GLYCOLAX) 17 g packet, Take 17 g by mouth daily as needed. , Disp: , Rfl:   Past Medical History: Past Medical History:  Diagnosis Date  . CAD (coronary artery disease)    a. 03/2015 NSTEMI/PCI: LCX 11m(DES), OM1 100p (DES - vessel labeled Ramus in subsequent cath report). Minor irregs to LAD/RCA; b. 04/2015 Cath: LM nl, LAD 40p, RI patent stent, LCX patent stent, RCA nl, EF 55-65%; c. 02/2016 MV: basal inferolateral and mid inferolateral defect/infarct w/o ischemia. EF 55-65%; d. 11/2018 PCI: LM 30ost, LAD 30p, LCX 95p/m (PTCA->20%), OM1 10ost, OM2 40ost, RCA 85p.  .Marland KitchenChronic abdominal pain   . Chronic diastolic (congestive) heart failure ([place]    a. 03/2015 Echo: EF 30-35%, mild  concentric LVH, severe HK of inf, inflat, & lat walls, mod MR, mild TR;  b. 04/2015 LV Gram: EF 55-65%; c. 09/2017 Echo: EF 55-60%, no rwma. Mild MR; d. 11/2018 Echo: Ef 60-65%, no rwma.  . CKD (chronic kidney disease), stage III [place]   . Diabetes type 2, controlled ([place]    a. since 2002   . Diabetic gastroparesis (HCC)    a. chronic nausea/vomiting.  . Diverticulosis, sigmoid 07/2016  . Heavy menses    a. H/O IUD - expired in 2014 - remains in place.  . Hyperlipidemia   . Hypertension   . Iron deficiency anemia   . Ischemic cardiomyopathy (resolved)    a. 03/2015 EF 30-35% post NSTEMI;  b. 04/2015 EF 55-65% on LV gram; c. 09/2017 Echo: EF 55-60%; d. 11/2018 Echo: Ef 60-65%.  . Obesity   . Tobacco abuse    a. quit 03/2015.  .Marland KitchenUterine fibroid   . Vertigo   . Vitamin D deficiency     Tobacco Use: Social History   Tobacco Use  Smoking Status Former Smoker  . Years: 15.00  . Quit date: 03/10/2015  . Years since quitting: 4.2  Smokeless Tobacco Never Used    Labs: Recent Review Flowsheet Data    Labs for ITP Cardiac and Pulmonary Rehab Latest Ref Rng & Units 01/22/2018 10/06/2018 12/17/2018 04/16/2019 04/21/2019   Cholestrol 100 - 199 mg/dL - 236(H) 245(H) - 307(H)   LDLCALC 0 - 99 mg/dL - 163(H) 165(H) - 237(H)   LDLDIRECT 0 - 99 mg/dL - - 163(H) - -   HDL >39 mg/dL - 39(L) 53 - 40   Trlycerides 0 - 149 mg/dL - 171(H) 137 - 149   Hemoglobin A1c 4.8 - 5.6 % 11.5(H) - - - 9.5(H)   HCO3 20.0 - 28.0 mmol/L - - - 23.3 -   O2SAT % - - - 90.3 -       Exercise Target Goals: Exercise Program Goal: Individual exercise prescription set using results from initial 6 min walk test and THRR while considering  patient's activity barriers and safety.   Exercise Prescription Goal: Initial exercise prescription builds to 30-45 minutes a day of aerobic activity, 2-3 days per week.  Home exercise guidelines will be given to patient during program as part of exercise prescription that the  participant will acknowledge.  Activity Barriers & Risk Stratification: Activity Barriers & Cardiac Risk Stratification - 01/21/19 1355      Activity Barriers & Cardiac Risk Stratification   Activity Barriers  Deconditioning;Muscular Weakness;Shortness of Breath;Balance Concerns;Back Problems;Joint Problems;Decreased Ventricular Function   knees and back can be very painful (chronic), occasional dizzy spells   Cardiac Risk  Stratification  High       6 Minute Walk: 6 Minute Walk    Row Name 01/21/19 1354         6 Minute Walk   Phase  Initial     Distance  850 feet     Walk Time  6 minutes     MPH  1.61     METS  3.5     RPE  12     Perceived Dyspnea   1     VO2 Peak  12.25     Symptoms  Yes (comment)     Comments  legs tired and painful 9/10     Resting HR  103 bpm     Resting BP  126/74     Resting Oxygen Saturation   96 %     Exercise Oxygen Saturation  during 6 min walk  97 %     Max Ex. HR  135 bpm     Max Ex. BP  156/74     2 Minute Post BP  130/70        Oxygen Initial Assessment:   Oxygen Re-Evaluation:   Oxygen Discharge (Final Oxygen Re-Evaluation):   Initial Exercise Prescription: Initial Exercise Prescription - 01/21/19 1300      Date of Initial Exercise RX and Referring Provider   Date  01/21/19    Referring Provider  End, Christiopher MD   Attending Cardiologist: Dr. Kathlyn [place]     Treadmill   MPH  1.4    Grade  0.5    Minutes  15    METs  2.17      REL-XR   Level  1    Speed  50    Minutes  15    METs  2.5      T5 Nustep   Level  2    SPM  80    Minutes  15    METs  2.5      Prescription Details   Frequency (times per week)  3    Duration  Progress to 45 minutes of aerobic exercise without signs/symptoms of physical distress      Intensity   THRR 40-80% of Max Heartrate  135-166    Ratings of Perceived Exertion  11-13    Perceived Dyspnea  0-4      Progression   Progression  Continue to progress workloads to  maintain intensity without signs/symptoms of physical distress.      Resistance Training   Training Prescription  Yes    Weight  3 lbs    Reps  10-15       Perform Capillary Blood Glucose checks as needed.  Exercise Prescription Changes: Exercise Prescription Changes    Row Name 01/21/19 1300             Response to Exercise   Blood Pressure (Admit)  126/74       Blood Pressure (Exercise)  156/74       Blood Pressure (Exit)  130/70       Heart Rate (Admit)  103 bpm       Heart Rate (Exercise)  135 bpm       Heart Rate (Exit)  108 bpm       Oxygen Saturation (Admit)  96 %       Oxygen Saturation (Exercise)  97 %       Rating of Perceived Exertion (Exercise)  12       Perceived  Dyspnea (Exercise)  1       Symptoms  legs tired and pain 9/10, SOB       Comments  walk test results          Exercise Comments:  Exercise Comments    Row Name 02/01/19 1737 02/23/19 1148         Exercise Comments   First full day of exercise!  Patient was oriented to gym and equipment including functions, settings, policies, and procedures.  Patient's individual exercise prescription and treatment plan were reviewed.  All starting workloads were established based on the results of the 6 minute walk test done at initial orientation visit.  The plan for exercise progression was also introduced and progression will be customized based on patient's performance and goals.  Reviewed home exercise with pt today.    Reviewed THR, pulse, RPE, sign and symptoms, NTG use, and when to call 911 or MD.  Also discussed weather considerations and indoor options.  Pt voiced understanding.         Exercise Goals and Review: Exercise Goals    Row Name 01/21/19 1406             Exercise Goals   Increase Physical Activity  Yes       Intervention  Provide advice, education, support and counseling about physical activity/exercise needs.;Develop an individualized exercise prescription for aerobic and resistive  training based on initial evaluation findings, risk stratification, comorbidities and participant's personal goals.       Expected Outcomes  Short Term: Attend rehab on a regular basis to increase amount of physical activity.;Long Term: Exercising regularly at least 3-5 days a week.;Long Term: Add in home exercise to make exercise part of routine and to increase amount of physical activity.       Increase Strength and Stamina  Yes       Intervention  Provide advice, education, support and counseling about physical activity/exercise needs.;Develop an individualized exercise prescription for aerobic and resistive training based on initial evaluation findings, risk stratification, comorbidities and participant's personal goals.       Expected Outcomes  Short Term: Increase workloads from initial exercise prescription for resistance, speed, and METs.;Short Term: Perform resistance training exercises routinely during rehab and add in resistance training at home;Long Term: Improve cardiorespiratory fitness, muscular endurance and strength as measured by increased METs and functional capacity (6MWT)       Able to understand and use rate of perceived exertion (RPE) scale  Yes       Intervention  Provide education and explanation on how to use RPE scale       Expected Outcomes  Short Term: Able to use RPE daily in rehab to express subjective intensity level;Long Term:  Able to use RPE to guide intensity level when exercising independently       Knowledge and understanding of Target Heart Rate Range (THRR)  Yes       Intervention  Provide education and explanation of THRR including how the numbers were predicted and where they are located for reference       Expected Outcomes  Short Term: Able to state/look up THRR;Short Term: Able to use daily as guideline for intensity in rehab;Long Term: Able to use THRR to govern intensity when exercising independently       Able to check pulse independently  Yes        Intervention  Provide education and demonstration on how to check pulse in carotid and radial arteries.;Review  the importance of being able to check your own pulse for safety during independent exercise       Expected Outcomes  Short Term: Able to explain why pulse checking is important during independent exercise;Long Term: Able to check pulse independently and accurately       Understanding of Exercise Prescription  Yes       Intervention  Provide education, explanation, and written materials on patient's individual exercise prescription       Expected Outcomes  Short Term: Able to explain program exercise prescription;Long Term: Able to explain home exercise prescription to exercise independently          Exercise Goals Re-Evaluation : Exercise Goals Re-Evaluation    Row Name 02/01/19 1737 02/23/19 1146           Exercise Goal Re-Evaluation   Exercise Goals Review  Increase Physical Activity;Increase Strength and Stamina;Able to understand and use rate of perceived exertion (RPE) scale;Able to understand and use Dyspnea scale  Increase Physical Activity;Increase Strength and Stamina;Able to understand and use rate of perceived exertion (RPE) scale;Knowledge and understanding of Target Heart Rate Range (THRR);Able to check pulse independently;Understanding of Exercise Prescription      Comments  Reviewed RPE scale, THR and program prescription with pt today.  Pt voiced understanding and was given a copy of goals to take home.   Reviewed home exercise with pt today.  Pt plans to use gym at apartment complex for exercise.  Reviewed THR, pulse, RPE, sign and symptoms, NTG use, and when to call 911 or MD.  Also discussed weather considerations and indoor options.  Pt voiced understanding.  HT classes ar enot meeting at this time due to [place] 19.        Expected Outcomes  Short: Use RPE daily to regulate intensity. Long: Follow program prescription in THR.  Short - exercise on her own while missing  classes Long - improve overall MET level         Discharge Exercise Prescription (Final Exercise Prescription Changes): Exercise Prescription Changes - 01/21/19 1300      Response to Exercise   Blood Pressure (Admit)  126/74    Blood Pressure (Exercise)  156/74    Blood Pressure (Exit)  130/70    Heart Rate (Admit)  103 bpm    Heart Rate (Exercise)  135 bpm    Heart Rate (Exit)  108 bpm    Oxygen Saturation (Admit)  96 %    Oxygen Saturation (Exercise)  97 %    Rating of Perceived Exertion (Exercise)  12    Perceived Dyspnea (Exercise)  1    Symptoms  legs tired and pain 9/10, SOB    Comments  walk test results       Nutrition:  Target Goals: Understanding of nutrition guidelines, daily intake of sodium <1539m, cholesterol <2018m calories 30% from fat and 7% or less from saturated fats, daily to have 5 or more servings of fruits and vegetables.  Biometrics: Pre Biometrics - 01/21/19 1407      Pre Biometrics   Height  5' 6.4" (1.687 m)    Weight  275 lb 12.8 oz (125.1 kg)    Waist Circumference  50 inches    Hip Circumference  52 inches    Waist to Hip Ratio  0.96 %    BMI (Calculated)  43.96    Single Leg Stand  4.58 seconds        Nutrition Therapy Plan and Nutrition Goals: Nutrition Therapy &  Goals - 02/24/19 1241      Nutrition Therapy   Diet  Diabetes Heart Healthy Low Sodium       Nutrition Assessments: Nutrition Assessments - 01/21/19 1429      MEDFICTS Scores   Pre Score  24       Nutrition Goals Re-Evaluation:   Nutrition Goals Discharge (Final Nutrition Goals Re-Evaluation):   Psychosocial: Target Goals: Acknowledge presence or absence of significant depression and/or stress, maximize coping skills, provide positive support system. Participant is able to verbalize types and ability to use techniques and skills needed for reducing stress and depression.   Initial Review & Psychosocial Screening: Initial Psych Review & Screening - 01/21/19  1426      Initial Review   Current issues with  Current Depression;Current Anxiety/Panic;Current Stress Concerns    Source of Stress Concerns  Chronic Illness;Unable to perform yard/household activities;Unable to participate in former interests or hobbies;Family    Comments  Beckham reports her quality of life is low due to inability to participate in some daily activities and hobbies related to her lack of stamina. She said her husband, who is a Administrator, and her 26 year old daughter are very supportive. She wants to work on her energy level while here      [place]?  Yes   husband, daughter, family     Barriers   Psychosocial barriers to participate in program  There are no identifiable barriers or psychosocial needs.;The patient should benefit from training in stress management and relaxation.      Screening Interventions   Interventions  Encouraged to exercise;Program counselor consult;To provide support and resources with identified psychosocial needs;Provide feedback about the scores to participant    Expected Outcomes  Short Term goal: Utilizing psychosocial counselor, staff and physician to assist with identification of specific Stressors or current issues interfering with healing process. Setting desired goal for each stressor or current issue identified.;Long Term Goal: Stressors or current issues are controlled or eliminated.;Short Term goal: Identification and review with participant of any Quality of Life or Depression concerns found by scoring the questionnaire.;Long Term goal: The participant improves quality of Life and PHQ9 Scores as seen by post scores and/or verbalization of changes       Quality of Life Scores:  Quality of Life - 01/21/19 1428      Quality of Life   Select  Quality of Life      Quality of Life Scores   Health/Function Pre  3.2 %    Socioeconomic Pre  15.43 %    Psych/Spiritual Pre  3.43 %    Family Pre  20.4 %     GLOBAL Pre  8.23 %      Scores of 19 and below usually indicate a poorer quality of life in these areas.  A difference of  2-3 points is a clinically meaningful difference.  A difference of 2-3 points in the total score of the Quality of Life Index has been associated with significant improvement in overall quality of life, self-image, physical symptoms, and general health in studies assessing change in quality of life.  PHQ-9: Recent Review Flowsheet Data    Depression screen [facility] 2/9 01/21/2019 10/06/2018 01/09/2018 08/18/2017 08/12/2017   Decreased Interest 3 0 0 1 2   Down, Depressed, Hopeless _0 PHQ - 2 Score _1 Altered sleeping _2 - 3  Tired, decreased energy _0 - 2   Change in appetite _1 - 2   Feeling bad or failure about yourself  _2 - 2   Trouble concentrating _3 - 2   Moving slowly or fidgety/restless 2 0 0 - 0   Suicidal thoughts 0 0 2 0 0   PHQ-9 Score _4 - 15   Difficult doing work/chores Extremely dIfficult Very difficult - - -     Interpretation of Total Score  Total Score Depression Severity:  1-4 = Minimal depression, 5-9 = Mild depression, 10-14 = Moderate depression, 15-19 = Moderately severe depression, 20-27 = Severe depression   Psychosocial Evaluation and Intervention: Psychosocial Evaluation - 03/01/19 1043      Psychosocial Evaluation & Interventions   Comments  Chaplain attempted to reach patient to conduct initial psychosocial evaluation.  Message left requesting call back from patient.    Expected Outcomes  Counselor will continue to attempt to reach patient.    Continue Psychosocial Services   Follow up required by counselor       Psychosocial Re-Evaluation:   Psychosocial Discharge (Final Psychosocial Re-Evaluation):   Vocational Rehabilitation: Provide vocational rehab assistance to qualifying candidates.   Vocational Rehab Evaluation & Intervention: Vocational Rehab - 01/21/19 1426      Initial Vocational  Rehab Evaluation & Intervention   Assessment shows need for Vocational Rehabilitation  No       Education: Education Goals: Education classes will be provided on a variety of topics geared toward better understanding of heart health and risk factor modification. Participant will state understanding/return demonstration of topics presented as noted by education test scores.  Learning Barriers/Preferences: Learning Barriers/Preferences - 01/21/19 1425      Learning Barriers/Preferences   Learning Barriers  None    Learning Preferences  None       Education Topics:  AED/CPR: - Group verbal and written instruction with the use of models to demonstrate the basic use of the AED with the basic ABC's of resuscitation.   Cardiac Rehab from 02/01/2019 in [facility] Cardiac and Pulmonary Rehab  Date  02/01/19  Educator  CE  Instruction Review Code  1- Verbalizes Understanding      General Nutrition Guidelines/Fats and Fiber: -Group instruction provided by verbal, written material, models and posters to present the general guidelines for heart healthy nutrition. Gives an explanation and review of dietary fats and fiber.   Controlling Sodium/Reading Food Labels: -Group verbal and written material supporting the discussion of sodium use in heart healthy nutrition. Review and explanation with models, verbal and written materials for utilization of the food label.   Cardiac Rehab from 08/02/2015 in [facility] Cardiac and Pulmonary Rehab  Date  07/31/15  Educator  PI  Instruction Review Code (retired)  2- meets goals/outcomes      Exercise Physiology & General Exercise Guidelines: - Group verbal and written instruction with models to review the exercise physiology of the cardiovascular system and associated critical values. Provides general exercise guidelines with specific guidelines to those with heart or lung disease.    Aerobic Exercise & Resistance Training: - Gives group verbal and written  instruction on the various components of exercise. Focuses on aerobic and resistive training programs and the benefits of this training and how to safely progress through these programs..   Flexibility, Balance, Mind/Body Relaxation: Provides group verbal/written instruction on the benefits of flexibility and balance training, including mind/body exercise modes such as yoga, pilates  and tai chi.  Demonstration and skill practice provided.   Stress and Anxiety: - Provides group verbal and written instruction about the health risks of elevated stress and causes of high stress.  Discuss the correlation between heart/lung disease and anxiety and treatment options. Review healthy ways to manage with stress and anxiety.   Depression: - Provides group verbal and written instruction on the correlation between heart/lung disease and depressed mood, treatment options, and the stigmas associated with seeking treatment.   Cardiac Rehab from 08/02/2015 in [facility] Cardiac and Pulmonary Rehab  Date  08/02/15  Educator  [facility]  Instruction Review Code (retired)  2- meets Designer, fashion/clothing & Physiology of the Heart: - Group verbal and written instruction and models provide basic cardiac anatomy and physiology, with the coronary electrical and arterial systems. Review of Valvular disease and Heart Failure   Cardiac Procedures: - Group verbal and written instruction to review commonly prescribed medications for heart disease. Reviews the medication, class of the drug, and side effects. Includes the steps to properly store meds and maintain the prescription regimen. (beta blockers and nitrates)   Cardiac Medications I: - Group verbal and written instruction to review commonly prescribed medications for heart disease. Reviews the medication, class of the drug, and side effects. Includes the steps to properly store meds and maintain the prescription regimen.   Cardiac Medications II: -Group verbal and  written instruction to review commonly prescribed medications for heart disease. Reviews the medication, class of the drug, and side effects. (all other drug classes)    Go Sex-Intimacy & Heart Disease, Get SMART - Goal Setting: - Group verbal and written instruction through game format to discuss heart disease and the return to sexual intimacy. Provides group verbal and written material to discuss and apply goal setting through the application of the S.M.A.R.T. Method.   Other Matters of the Heart: - Provides group verbal, written materials and models to describe Stable Angina and Peripheral Artery. Includes description of the disease process and treatment options available to the cardiac patient.   Exercise & Equipment Safety: - Individual verbal instruction and demonstration of equipment use and safety with use of the equipment.   Cardiac Rehab from 02/01/2019 in [facility] Cardiac and Pulmonary Rehab  Date  01/21/19  Educator  [facility]  Instruction Review Code  1- Verbalizes Understanding      Infection Prevention: - Provides verbal and written material to individual with discussion of infection control including proper hand washing and proper equipment cleaning during exercise session.   Cardiac Rehab from 02/01/2019 in [facility] Cardiac and Pulmonary Rehab  Date  01/21/19  Educator  [facility]  Instruction Review Code  1- Verbalizes Understanding      Falls Prevention: - Provides verbal and written material to individual with discussion of falls prevention and safety.   Cardiac Rehab from 02/01/2019 in [facility] Cardiac and Pulmonary Rehab  Date  01/21/19  Educator  [facility]  Instruction Review Code  1- Verbalizes Understanding      Diabetes: - Individual verbal and written instruction to review signs/symptoms of diabetes, desired ranges of glucose level fasting, after meals and with exercise. Acknowledge that pre and post exercise glucose checks will be done for 3 sessions at entry of program.   Cardiac  Rehab from 02/01/2019 in [facility] Cardiac and Pulmonary Rehab  Date  01/21/19  Educator  [facility]  Instruction Review Code  1- Verbalizes Understanding      Know Your Numbers and Risk Factors: -  Group verbal and written instruction about important numbers in your health.  Discussion of what are risk factors and how they play a role in the disease process.  Review of Cholesterol, Blood Pressure, Diabetes, and BMI and the role they play in your overall health.   Sleep Hygiene: -Provides group verbal and written instruction about how sleep can affect your health.  Define sleep hygiene, discuss sleep cycles and impact of sleep habits. Review good sleep hygiene tips.    Other: -Provides group and verbal instruction on various topics (see comments)   Knowledge Questionnaire Score: Knowledge Questionnaire Score - 01/21/19 1425      Knowledge Questionnaire Score   Pre Score  24/26   correct answers reviewed with [place]. Focus on PAD and Nutrition       Core Components/Risk Factors/Patient Goals at Admission: Personal Goals and Risk Factors at Admission - 01/21/19 1424      Core Components/Risk Factors/Patient Goals on Admission    Weight Management  Yes;Obesity;Weight Loss    Intervention  Weight Management: Develop a combined nutrition and exercise program designed to reach desired caloric intake, while maintaining appropriate intake of nutrient and fiber, sodium and fats, and appropriate energy expenditure required for the weight goal.;Weight Management: Provide education and appropriate resources to help participant work on and attain dietary goals.;Weight Management/Obesity: Establish reasonable short term and long term weight goals.;Obesity: Provide education and appropriate resources to help participant work on and attain dietary goals.    Admit Weight  275 lb 12.8 oz (125.1 kg)    Goal Weight: Short Term  270 lb (122.5 kg)    Goal Weight: Long Term  140 lb (63.5 kg)    Expected Outcomes   Short Term: Continue to assess and modify interventions until short term weight is achieved;Long Term: Adherence to nutrition and physical activity/exercise program aimed toward attainment of established weight goal;Weight Loss: Understanding of general recommendations for a balanced deficit meal plan, which promotes 1-2 lb weight loss per week and includes a negative energy balance of [phone] kcal/d;Understanding recommendations for meals to include 15-35% energy as protein, 25-35% energy from fat, 35-60% energy from carbohydrates, less than 232m of dietary cholesterol, 20-35 gm of total fiber daily;Understanding of distribution of calorie intake throughout the day with the consumption of 4-5 meals/snacks    Diabetes  Yes    Intervention  Provide education about signs/symptoms and action to take for hypo/hyperglycemia.;Provide education about proper nutrition, including hydration, and aerobic/resistive exercise prescription along with prescribed medications to achieve blood glucose in normal ranges: Fasting glucose 65-99 mg/dL    Expected Outcomes  Short Term: Participant verbalizes understanding of the signs/symptoms and immediate care of hyper/hypoglycemia, proper foot care and importance of medication, aerobic/resistive exercise and nutrition plan for blood glucose control.;Long Term: Attainment of HbA1C < 7%.    Heart Failure  Yes    Intervention  Provide a combined exercise and nutrition program that is supplemented with education, support and counseling about heart failure. Directed toward relieving symptoms such as shortness of breath, decreased exercise tolerance, and extremity edema.    Expected Outcomes  Improve functional capacity of life;Short term: Attendance in program 2-3 days a week with increased exercise capacity. Reported lower sodium intake. Reported increased fruit and vegetable intake. Reports medication compliance.;Short term: Daily weights obtained and reported for increase.  Utilizing diuretic protocols set by physician.;Long term: Adoption of self-care skills and reduction of barriers for early signs and symptoms recognition and intervention leading to self-care maintenance.  Hypertension  Yes    Intervention  Provide education on lifestyle modifcations including regular physical activity/exercise, weight management, moderate sodium restriction and increased consumption of fresh fruit, vegetables, and low fat dairy, alcohol moderation, and smoking cessation.;Monitor prescription use compliance.    Expected Outcomes  Short Term: Continued assessment and intervention until BP is < 140/21m HG in hypertensive participants. < 130/866mHG in hypertensive participants with diabetes, heart failure or chronic kidney disease.;Long Term: Maintenance of blood pressure at goal levels.    Lipids  Yes    Intervention  Provide education and support for participant on nutrition & aerobic/resistive exercise along with prescribed medications to achieve LDL <7027mHDL >63m73m  Expected Outcomes  Short Term: Participant states understanding of desired cholesterol values and is compliant with medications prescribed. Participant is following exercise prescription and nutrition guidelines.;Long Term: Cholesterol controlled with medications as prescribed, with individualized exercise RX and with personalized nutrition plan. Value goals: LDL < 70mg64mL > 40 mg.       Core Components/Risk Factors/Patient Goals Review:  Goals and Risk Factor Review    Row Name 02/23/19 1149             Core Components/Risk Factors/Patient Goals Review   Personal Goals Review  Diabetes;Hypertension;Other       Review  FerniVadaan infusion tomorrow due to anemia.  She is taking all meds as directed and checking BG and BP at home.  They have been in normal range.  She is walking up and down stairs at her home foe exercise.  She states she has a lot of pain but wants to move.  She will consult with Dr  tomorrow about level of exercise considering infusion.       Expected Outcomes  Short - check with Dr about exercise due to infusion Long - improve exercise level          Core Components/Risk Factors/Patient Goals at Discharge (Final Review):  Goals and Risk Factor Review - 02/23/19 1149      Core Components/Risk Factors/Patient Goals Review   Personal Goals Review  Diabetes;Hypertension;Other    Review  FerniJiaan infusion tomorrow due to anemia.  She is taking all meds as directed and checking BG and BP at home.  They have been in normal range.  She is walking up and down stairs at her home foe exercise.  She states she has a lot of pain but wants to move.  She will consult with Dr tomorrow about level of exercise considering infusion.    Expected Outcomes  Short - check with Dr about exercise due to infusion Long - improve exercise level       ITP Comments: ITP Comments    Row Name 02/03/19 0620 02/26/19 0809 03/03/19 1304 03/03/19 1308     ITP Comments  30 day review. Continue with ITP unless directed changes by Medical Director chart review. New to program  Our program is currently closed due to COVID-19.  We are communicating with patient via phone calls and emails.   30 day review. Continue with ITP unless directed changes by Medical Director chart review.   30 day review. Continue with ITP unless directed changes by Medical Director chart review.        Comments:

## 2019-06-24 ENCOUNTER — Telehealth: Payer: Self-pay

## 2019-06-24 NOTE — Telephone Encounter (Signed)
I have made the 2nd attempt to contact the patient or family member in charge, in order to follow up from recently being discharged from the hospital. I left a message on voicemail. 

## 2019-06-24 NOTE — Telephone Encounter (Signed)
Thank you Tiffany.  I did reach out to clinical staff too to alert them of need for hospital follow-up.  I know she sees cardiology next week . I will try to reach out to her too.

## 2019-06-25 ENCOUNTER — Other Ambulatory Visit: Payer: Self-pay

## 2019-06-25 ENCOUNTER — Encounter: Payer: Self-pay | Admitting: Emergency Medicine

## 2019-06-25 ENCOUNTER — Emergency Department
Admission: EM | Admit: 2019-06-25 | Discharge: 2019-06-25 | Disposition: A | Discharge status: Home / Self Care | Payer: Medicare Other | Attending: Emergency Medicine | Admitting: Emergency Medicine

## 2019-06-25 DIAGNOSIS — I5032 Chronic diastolic (congestive) heart failure: Secondary | ICD-10-CM | POA: Diagnosis not present

## 2019-06-25 DIAGNOSIS — I130 Hypertensive heart and chronic kidney disease with heart failure and stage 1 through stage 4 chronic kidney disease, or unspecified chronic kidney disease: Secondary | ICD-10-CM | POA: Insufficient documentation

## 2019-06-25 DIAGNOSIS — Z7902 Long term (current) use of antithrombotics/antiplatelets: Secondary | ICD-10-CM | POA: Diagnosis not present

## 2019-06-25 DIAGNOSIS — Z79899 Other long term (current) drug therapy: Secondary | ICD-10-CM | POA: Insufficient documentation

## 2019-06-25 DIAGNOSIS — I2510 Atherosclerotic heart disease of native coronary artery without angina pectoris: Secondary | ICD-10-CM | POA: Insufficient documentation

## 2019-06-25 DIAGNOSIS — Z7982 Long term (current) use of aspirin: Secondary | ICD-10-CM | POA: Diagnosis not present

## 2019-06-25 DIAGNOSIS — Z87891 Personal history of nicotine dependence: Secondary | ICD-10-CM | POA: Insufficient documentation

## 2019-06-25 DIAGNOSIS — N183 Chronic kidney disease, stage 3 (moderate): Secondary | ICD-10-CM | POA: Insufficient documentation

## 2019-06-25 DIAGNOSIS — E1122 Type 2 diabetes mellitus with diabetic chronic kidney disease: Secondary | ICD-10-CM | POA: Insufficient documentation

## 2019-06-25 DIAGNOSIS — K3184 Gastroparesis: Secondary | ICD-10-CM

## 2019-06-25 DIAGNOSIS — R1010 Upper abdominal pain, unspecified: Secondary | ICD-10-CM | POA: Diagnosis not present

## 2019-06-25 LAB — COMPREHENSIVE METABOLIC PANEL
ALT: 18 U/L (ref 0–44)
AST: 27 U/L (ref 15–41)
Albumin: 3.5 g/dL (ref 3.5–5.0)
Alkaline Phosphatase: 99 U/L (ref 38–126)
Anion gap: 12 (ref 5–15)
BUN: 18 mg/dL (ref 6–20)
CO2: 25 mmol/L (ref 22–32)
Calcium: 9.1 mg/dL (ref 8.9–10.3)
Chloride: 104 mmol/L (ref 98–111)
Creatinine, Ser: 1.41 mg/dL — ABNORMAL HIGH (ref 0.44–1.00)
GFR calc Af Amer: 54 mL/min — ABNORMAL LOW (ref >60)
GFR calc non Af Amer: 47 mL/min — ABNORMAL LOW (ref >60)
Glucose, Bld: 164 mg/dL — ABNORMAL HIGH (ref 70–99)
Potassium: 3.3 mmol/L — ABNORMAL LOW (ref 3.5–5.1)
Sodium: 141 mmol/L (ref 135–145)
Total Bilirubin: 0.5 mg/dL (ref 0.3–1.2)
Total Protein: 7.6 g/dL (ref 6.5–8.1)

## 2019-06-25 LAB — CBC WITH DIFFERENTIAL/PLATELET
Abs Immature Granulocytes: 0.03 10*3/uL (ref 0.00–0.07)
Basophils Absolute: 0.1 10*3/uL (ref 0.0–0.1)
Basophils Relative: 1 %
Eosinophils Absolute: 0.1 10*3/uL (ref 0.0–0.5)
Eosinophils Relative: 1 %
HCT: 39.8 % (ref 36.0–46.0)
Hemoglobin: 12.8 g/dL (ref 12.0–15.0)
Immature Granulocytes: 0 %
Lymphocytes Relative: 14 %
Lymphs Abs: 1.5 10*3/uL (ref 0.7–4.0)
MCH: 26.4 pg (ref 26.0–34.0)
MCHC: 32.2 g/dL (ref 30.0–36.0)
MCV: 82.2 fL (ref 80.0–100.0)
Monocytes Absolute: 0.4 10*3/uL (ref 0.1–1.0)
Monocytes Relative: 4 %
Neutro Abs: 8.4 10*3/uL — ABNORMAL HIGH (ref 1.7–7.7)
Neutrophils Relative %: 80 %
Platelets: 446 10*3/uL — ABNORMAL HIGH (ref 150–400)
RBC: 4.84 MIL/uL (ref 3.87–5.11)
RDW: 13.7 % (ref 11.5–15.5)
WBC: 10.5 10*3/uL (ref 4.0–10.5)
nRBC: 0 % (ref 0.0–0.2)

## 2019-06-25 LAB — LIPASE, BLOOD: Lipase: 26 U/L (ref 11–51)

## 2019-06-25 MED ORDER — PROMETHAZINE HCL 25 MG/ML IJ SOLN
25.0000 mg | Freq: Once | INTRAMUSCULAR | Status: AC
  Administered 2019-06-25: 12:00:00 25 mg via INTRAVENOUS

## 2019-06-25 MED ORDER — MORPHINE SULFATE (PF) 4 MG/ML IV SOLN
4.0000 mg | Freq: Once | INTRAVENOUS | Status: AC
  Administered 2019-06-25: 4 mg via INTRAVENOUS
  Filled 2019-06-25: qty 1

## 2019-06-25 MED ORDER — SODIUM CHLORIDE 0.9 % IV SOLN
1000.0000 mL | Freq: Once | INTRAVENOUS | Status: AC
  Administered 2019-06-25: 11:00:00 1000 mL via INTRAVENOUS

## 2019-06-25 MED ORDER — TRAMADOL HCL 50 MG PO TABS: 50.0000 mg | ORAL_TABLET | Freq: Four times a day (QID) | ORAL | 0 refills | Status: DC | PRN

## 2019-06-25 MED ORDER — ONDANSETRON HCL 4 MG/2ML IJ SOLN
4.0000 mg | Freq: Once | INTRAMUSCULAR | Status: AC
  Administered 2019-06-25: 4 mg via INTRAVENOUS
  Filled 2019-06-25: qty 2

## 2019-06-25 MED ORDER — LABETALOL HCL 5 MG/ML IV SOLN
20.0000 mg | Freq: Once | INTRAVENOUS | Status: AC
  Administered 2019-06-25: 13:00:00 20 mg via INTRAVENOUS
  Filled 2019-06-25: qty 4

## 2019-06-25 MED ORDER — CLONIDINE HCL 0.1 MG PO TABS
0.2000 mg | ORAL_TABLET | Freq: Once | ORAL | Status: DC
  Filled 2019-06-25: qty 2

## 2019-06-25 NOTE — ED Provider Notes (Signed)
[facility] Emergency Department Provider Note   ____________________________________________    I have reviewed the triage vital signs and the nursing notes.   HISTORY  Chief Complaint Abdominal Pain     HPI [name] [name] is a 38 y.o. female with a history of CAD, chronic abdominal pain, tractable nausea vomiting/cyclical vomiting, CHF, CKD, diabetes presents today with upper abdominal pain, nausea vomiting consistent with her history of gastroparesis.  She reports this started abruptly overnight.  Recently discharged from the hospital after similar episode.  Had imaging including ultrasound done during that hospitalization which did not show any significant gallbladder abnormalities.  Past Medical History:  Diagnosis Date   CAD (coronary artery disease)    a. 03/2015 NSTEMI/PCI: LCX 122m (DES), OM1 100p (DES - vessel labeled Ramus in subsequent cath report). Minor irregs to LAD/RCA; b. 04/2015 Cath: LM nl, LAD 40p, RI patent stent, LCX patent stent, RCA nl, EF 55-65%; c. 02/2016 MV: basal inferolateral and mid inferolateral defect/infarct w/o ischemia. EF 55-65%; d. 11/2018 PCI: LM 30ost, LAD 30p, LCX 95p/m (PTCA->20%), OM1 10ost, OM2 40ost, RCA 85p.   Chronic abdominal pain    Chronic diastolic (congestive) heart failure ([place])    a. 03/2015 Echo: EF 30-35%, mild concentric LVH, severe HK of inf, inflat, & lat walls, mod MR, mild TR;  b. 04/2015 LV Gram: EF 55-65%; c. 09/2017 Echo: EF 55-60%, no rwma. Mild MR; d. 11/2018 Echo: Ef 60-65%, no rwma.   CKD (chronic kidney disease), stage III ([place])    Diabetes type 2, controlled ([place])    a. since 2002    Diabetic gastroparesis (HCC)    a. chronic nausea/vomiting.   Diverticulosis, sigmoid 07/2016   Heavy menses    a. H/O IUD - expired in 2014 - remains in place.   Hyperlipidemia    Hypertension    Iron deficiency anemia    Ischemic cardiomyopathy (resolved)    a. 03/2015 EF 30-35% post NSTEMI;   b. 04/2015 EF 55-65% on LV gram; c. 09/2017 Echo: EF 55-60%; d. 11/2018 Echo: Ef 60-65%.   Obesity    Tobacco abuse    a. quit 03/2015.   Uterine fibroid    Vertigo    Vitamin D deficiency     Patient Active Problem List   Diagnosis Date Noted   Preop cardiovascular exam    Diabetic gastroparesis ([place]) 06/18/2019   Anemia 01/14/2019   Stable angina ([place]) 01/12/2019   Thrombocytosis ([place]) 01/05/2019   Coronary artery disease of native artery of native heart with stable angina pectoris ([place]) 12/17/2018   Unstable angina ([place]) 12/04/2018   Diabetic gastroparesis associated with type 2 diabetes mellitus ([employer]) 05/02/2018   CKD stage 3 due to type 2 diabetes mellitus ([place]) 04/06/2018   Poorly controlled type 2 diabetes mellitus ([place]) 08/27/2017   Lung nodule < 6cm on CT 08/12/2017   Depression, major, single episode, severe ([place]) 08/12/2017   Chronic abdominal pain 08/03/2017   Obstructive sleep apnea 05/16/2017   Neuropathy 01/17/2017   Vitamin D deficiency 10/25/2016   Insomnia 10/25/2016   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[place]) 09/05/2016   Elevated liver function tests 06/05/2016   Chronic diastolic heart failure ([place]) 03/19/2016   Proteinuria 08/30/2015   CAD in native artery    Angina pectoris (HCC)    Iron deficiency anemia    Hyperlipidemia associated with type 2 diabetes mellitus ([place]) 04/13/2015   Ischemic cardiomyopathy    Obesity  Past Surgical History:  Procedure Laterality Date   APPENDECTOMY     CARDIAC CATHETERIZATION  4/16   x2 stent [place] N/A 05/05/2015   Procedure: Left Heart Cath and Coronary Angiography;  Surgeon: Peter M Martinique, MD;  Location: [place] [facility];  Service: Cardiovascular;  Laterality: N/A;   COLONOSCOPY WITH PROPOFOL N/A 07/31/2016   Procedure: COLONOSCOPY WITH PROPOFOL;  Surgeon: Lollie Sails, MD;  Location: [facility] ENDOSCOPY;  Service:  Endoscopy;  Laterality: N/A;   CORONARY BALLOON ANGIOPLASTY N/A 12/04/2018   Procedure: CORONARY BALLOON ANGIOPLASTY;  Surgeon: Wellington Hampshire, MD;  Location: [facility];  Service: Cardiovascular;  Laterality: N/A;   CORONARY STENT INTERVENTION N/A 01/12/2019   Procedure: CORONARY STENT INTERVENTION;  Surgeon: [name], MD;  Location: [place] CV LAB;  Service: Cardiovascular;  Laterality: N/A;   ESOPHAGOGASTRODUODENOSCOPY (EGD) WITH PROPOFOL N/A 07/31/2016   Procedure: ESOPHAGOGASTRODUODENOSCOPY (EGD) WITH PROPOFOL;  Surgeon: Lollie Sails, MD;  Location: [facility] ENDOSCOPY;  Service: Endoscopy;  Laterality: N/A;   INTRAVASCULAR PRESSURE WIRE/FFR STUDY N/A 12/04/2018   Procedure: INTRAVASCULAR PRESSURE WIRE/FFR STUDY;  Surgeon: Wellington Hampshire, MD;  Location: [place] [facility] LAB;  Service: Cardiovascular;  Laterality: N/A;   LAPAROSCOPIC APPENDECTOMY N/A 08/07/2016   Procedure: APPENDECTOMY LAPAROSCOPIC;  Surgeon: Hubbard Robinson, MD;  Location: [facility] ORS;  Service: General;  Laterality: N/A;   LEFT HEART CATH AND CORONARY ANGIOGRAPHY Left 12/04/2018   Procedure: LEFT HEART CATH AND CORONARY ANGIOGRAPHY;  Surgeon: Wellington Hampshire, MD;  Location: [place] [facility];  Service: Cardiovascular;  Laterality: Left;   LEFT HEART CATH AND CORONARY ANGIOGRAPHY N/A 01/12/2019   Procedure: LEFT HEART CATH AND CORONARY ANGIOGRAPHY;  Surgeon: [name], MD;  Location: [place] [facility];  Service: Cardiovascular;  Laterality: N/A;   MOUTH SURGERY      Prior to Admission medications   Medication Sig Start Date End Date Taking? Authorizing Provider  amitriptyline (ELAVIL) 25 MG tablet Take 25 mg by mouth at bedtime. 04/18/19   [provider]  aspirin EC 81 MG tablet Take 81 mg by mouth daily.    [provider]  Cholecalciferol 2000 units TABS Take 2,000 Units by mouth daily.     [provider]  ciprofloxacin (CIPRO) 500 MG tablet  Take 1 tablet (500 mg total) by mouth 2 (two) times daily for 3 days. 06/22/19 06/25/19  Saundra Shelling, MD  clopidogrel (PLAVIX) 75 MG tablet Take 1 tablet (75 mg total) by mouth daily. 06/22/19 07/22/19  Saundra Shelling, MD  DULoxetine (CYMBALTA) 30 MG capsule Take 60 mg by mouth daily.  07/01/18   [provider]  etonogestrel (NEXPLANON) 68 MG IMPL implant 1 each by Subdermal route once.    [provider]  FARXIGA 10 MG TABS tablet Take 10 mg by mouth daily.  09/08/18   [provider]  furosemide (LASIX) 40 MG tablet Take 1 tablet (40 mg total) by mouth every other day. 01/19/19 07/18/19  End, Harrell Gave, MD  glimepiride (AMARYL) 4 MG tablet TAKE 1 TABLET BY MOUTH ONCE DAILY Patient taking differently: Take 4 mg by mouth daily.  10/21/18   Cannady, Henrine Screws T, NP  insulin aspart (NOVOLOG) 100 UNIT/ML injection Inject 10 Units into the skin 3 (three) times daily with meals. 05/04/18   Loletha Grayer, MD  insulin glargine (LANTUS) 100 UNIT/ML injection Inject 60 Units into the skin daily.    [provider]  irbesartan (AVAPRO) 75 MG tablet  Take 1 tablet (75 mg total) by mouth daily. 04/29/19   Wellington Hampshire, MD  isosorbide mononitrate (IMDUR) 30 MG 24 hr tablet Take 2 tablets (60 mg total) by mouth daily. 12/17/18   Rise Mu, PA-C  loperamide (IMODIUM A-D) 2 MG tablet Take 8-12 mg by mouth daily as needed for diarrhea or loose stools.    [provider]  meclizine (ANTIVERT) 25 MG tablet Take 25 mg by mouth 3 (three) times daily as needed for dizziness.    [provider]  metoCLOPramide (REGLAN) 10 MG tablet Take 1 tablet (10 mg total) by mouth 3 (three) times daily with meals for 10 days. 04/17/19 06/18/19  Menshew, Dannielle Karvonen, PA-C  metoprolol tartrate (LOPRESSOR) 100 MG tablet Take 1 tablet (100 mg total) by mouth 2 (two) times daily. 04/29/19   Wellington Hampshire, MD  mirtazapine (REMERON) 30 MG tablet Take 60 mg by mouth daily.  08/12/18    [provider]  nitroGLYCERIN (NITROSTAT) 0.4 MG SL tablet Take 1 tab under your tongue while sitting for chest pain, If no relief may repeat, one tab every 5 min up to 3 tabs total over 15 mins. 06/09/18   Theora Gianotti, NP  ondansetron (ZOFRAN) 4 MG tablet Take 1 tablet (4 mg total) by mouth every 8 (eight) hours as needed for nausea or vomiting. 04/16/19   Schuyler Amor, MD  pantoprazole (PROTONIX) 40 MG tablet Take 1 tablet (40 mg total) by mouth 2 (two) times daily. 06/22/19 07/22/19  Saundra Shelling, MD  polyethylene glycol (MIRALAX / GLYCOLAX) 17 g packet Take 17 g by mouth daily as needed.  04/18/19   [provider]  traMADol (ULTRAM) 50 MG tablet Take 1 tablet (50 mg total) by mouth every 6 (six) hours as needed. 06/25/19 06/24/20  Lavonia Drafts, MD     Allergies Lisinopril, Rosemary oil, Shellfish allergy, Other, and Metformin and related  Family History  Problem Relation Age of Onset   Diabetes Father    Cancer Maternal Grandmother        lung   Cancer Maternal Grandfather        prostate   Diabetes Paternal Grandfather    Diabetes Paternal Grandmother    Cancer - Cervical Maternal Aunt     Social History Social History   Tobacco Use   Smoking status: Former Smoker    Years: 15.00    Quit date: 03/10/2015    Years since quitting: 4.2   Smokeless tobacco: Never Used  Substance Use Topics   Alcohol use: No    Alcohol/week: 0.0 standard drinks   Drug use: No    Review of Systems  Constitutional: No fever/chills Eyes: No visual changes.  ENT: No sore throat. Cardiovascular: Denies chest pain. Respiratory: Denies shortness of breath. Gastrointestinal: As above Genitourinary: Negative for dysuria. Musculoskeletal: Negative for back pain. Skin: Negative for rash. Neurological: Negative for headaches or weakness   ____________________________________________   PHYSICAL EXAM:  VITAL SIGNS: ED Triage Vitals  Enc Vitals  Group     BP 06/25/19 1036 (!) 190/166     Pulse Rate 06/25/19 1036 (!) 122     Resp 06/25/19 1036 18     Temp 06/25/19 1036 98.6 F (37 C)     Temp Source 06/25/19 1036 Oral     SpO2 06/25/19 1036 100 %     Weight 06/25/19 1057 117.9 kg (260 lb)     Height 06/25/19 1057 1.626 m (5\' 4" )  Head Circumference --      Peak Flow --      Pain Score 06/25/19 1046 10     Pain Loc --      Pain Edu? --      Excl. in [place]? --     Constitutional: Alert and oriented.  Eyes: Conjunctivae are normal.   Nose: No congestion/rhinnorhea. Mouth/Throat: Mucous membranes are moist.    Cardiovascular: Normal rate, regular rhythm. Grossly normal heart sounds.  Good peripheral circulation. Respiratory: Normal respiratory effort.  No retractions. Gastrointestinal: Soft and nontender. No distention.   Genitourinary: deferred Musculoskeletal  Warm and well perfused Neurologic:  Normal speech and language. No gross focal neurologic deficits are appreciated.  Skin:  Skin is warm, dry and intact. No rash noted. Psychiatric: Mood and affect are normal. Speech and behavior are normal.  ____________________________________________   LABS (all labs ordered are listed, but only abnormal results are displayed)  Labs Reviewed  CBC WITH DIFFERENTIAL/PLATELET - Abnormal; Notable for the following components:      Result Value   Platelets 446 (*)    Neutro Abs 8.4 (*)    All other components within normal limits  COMPREHENSIVE METABOLIC PANEL - Abnormal; Notable for the following components:   Potassium 3.3 (*)    Glucose, Bld 164 (*)    Creatinine, Ser 1.41 (*)    GFR calc non Af Amer 47 (*)    GFR calc Af Amer 54 (*)    All other components within normal limits  LIPASE, BLOOD   ____________________________________________  EKG  ED ECG REPORT I, Lavonia Drafts, the attending physician, personally viewed and interpreted this ECG.  Date: 06/25/2019  Rhythm: Sinus tachycardia QRS Axis:  normal Intervals: normal ST/T Wave abnormalities: normal Narrative Interpretation: no evidence of acute ischemia  ____________________________________________  RADIOLOGY  None ____________________________________________   PROCEDURES  Procedure(s) performed: No  Procedures   Critical Care performed: No ____________________________________________   INITIAL IMPRESSION / ASSESSMENT AND PLAN / ED COURSE  Pertinent labs & imaging results that were available during my care of the patient were reviewed by me and considered in my medical decision making (see chart for details).  Patient presents with nausea vomiting upper abdominal pain.  Similar presentation to when I saw her approximately 1 week ago.  She had been doing better when she left the hospital, symptoms restarted this morning.  She has not take anything for this.  We will treat with IV Phenergan, IV fluids check labs and reevaluate.   Patient has had significant provement with IV Phenergan as well as IV morphine.  She is feeling better.  She is significantly hypertensive, given IV labetalol with only mild improvement 1, will add clonidine p.o.  She was unable to take her blood pressure medications this morning which is likely the cause of her significant hypertension.  Patient does not want to stay any longer to wait for blood pressure control, she wants to go home, she states she will take her home medications there.  States she is tired of being in the hospital given recent admission    ____________________________________________   FINAL CLINICAL IMPRESSION(S) / ED DIAGNOSES  Final diagnoses:  Gastroparesis        Note:  This document was prepared using Dragon voice recognition software and may include unintentional dictation errors.   Lavonia Drafts, MD 06/25/19 1311

## 2019-06-25 NOTE — ED Notes (Signed)
Pt reports would like something else for nausea. Dr Corky Downs notified.

## 2019-06-25 NOTE — ED Triage Notes (Signed)
Pt arrives with complaints of upper right quadrant pain that started at midnight. Pt also reports n/v/d.

## 2019-06-25 NOTE — ED Triage Notes (Signed)
Pt states ruq pain, nausea, and vomiting since 5am.

## 2019-06-25 NOTE — ED Notes (Signed)
Vitals noted. Dr Corky Downs notified. Pt cleared for discharge.

## 2019-06-30 ENCOUNTER — Ambulatory Visit (INDEPENDENT_AMBULATORY_CARE_PROVIDER_SITE_OTHER): Payer: Medicare Other | Admitting: Surgery

## 2019-06-30 ENCOUNTER — Encounter: Payer: Self-pay | Admitting: Surgery

## 2019-06-30 ENCOUNTER — Other Ambulatory Visit: Payer: Self-pay

## 2019-06-30 ENCOUNTER — Telehealth: Payer: Self-pay

## 2019-06-30 VITALS — BP 134/78 | HR 82 | Temp 97.8°F | Ht 65.0 in | Wt 268.0 lb

## 2019-06-30 DIAGNOSIS — I200 Unstable angina: Secondary | ICD-10-CM

## 2019-06-30 DIAGNOSIS — R109 Unspecified abdominal pain: Secondary | ICD-10-CM | POA: Diagnosis not present

## 2019-06-30 MED ORDER — PROCHLORPERAZINE MALEATE 5 MG PO TABS: 5.0000 mg | ORAL_TABLET | Freq: Four times a day (QID) | ORAL | 0 refills | Status: DC | PRN

## 2019-06-30 NOTE — Telephone Encounter (Signed)
° ° °  COVID-19 Pre-Screening Questions: ° °• In the past 7 to 10 days have you had a cough,  shortness of breath, headache, congestion, fever (100 or greater) body aches, chills, sore throat, or sudden loss of taste or sense of smell? no °• Have you been around anyone with known Covid 19. No  °• Have you been around anyone who is awaiting Covid 19 test results in the past 7 to 10 days? No  °• Have you been around anyone who has been exposed to Covid 19, or has mentioned symptoms of Covid 19 within the past 7 to 10 days? No  ° °If you have any concerns/questions about symptoms patients report during screening (either on the phone or at threshold). Contact the provider seeing the patient or DOD for further guidance.  If neither are available contact a member of the leadership team. ° ° °

## 2019-06-30 NOTE — Patient Instructions (Addendum)
Referral will be sent to [facility]. Someone from their office will contact you to schedule an appointment. [phone].  Please pick your medicine up at the pharmacy.

## 2019-07-01 ENCOUNTER — Ambulatory Visit: Payer: Medicare Other | Admitting: Family

## 2019-07-01 ENCOUNTER — Encounter: Payer: Self-pay | Admitting: Nurse Practitioner

## 2019-07-01 DIAGNOSIS — E785 Hyperlipidemia, unspecified: Secondary | ICD-10-CM | POA: Insufficient documentation

## 2019-07-01 NOTE — Progress Notes (Signed)
Outpatient Surgical Follow Up  07/01/2019  [name] [name] is an 38 y.o. female.   Chief Complaint  Patient presents with  . Other    gallbadder    HPI: 38 year old well-known to me with a history of intractable nausea and gastroparesis.  She is morbidly obese with a BMI of 44.6.  She reports that she had another episode recently that went to the emergency room.  She describes the pain as in the epigastric area sharp and moderate to severe in intensity.  She reports that her pain has subsided significantly. No fevers no chills.  I did review once again the ultrasound the CT scan and the HIDA scan failing to reveal the gallbladder as the culprit of her symptoms.  Past Medical History:  Diagnosis Date  . CAD (coronary artery disease)    a. 03/2015 NSTEMI/PCI: LCX 128m (DES), OM1 100p (DES - vessel labeled Ramus in subsequent cath report). Minor irregs to LAD/RCA; b. 04/2015 Cath: LM nl, LAD 40p, RI patent stent, LCX patent stent, RCA nl, EF 55-65%; c. 02/2016 MV: basal inferolateral and mid inferolateral defect/infarct w/o ischemia. EF 55-65%; d. 11/2018 PCI: LM 30ost, LAD 30p, LCX 95p/m (PTCA->20%), OM1 10ost, OM2 40ost, RCA 85p.  Marland Kitchen Chronic abdominal pain   . Chronic diastolic (congestive) heart failure ([place])    a. 03/2015 Echo: EF 30-35%, mild concentric LVH, severe HK of inf, inflat, & lat walls, mod MR, mild TR;  b. 04/2015 LV Gram: EF 55-65%; c. 09/2017 Echo: EF 55-60%, no rwma. Mild MR; d. 11/2018 Echo: Ef 60-65%, no rwma.  . CKD (chronic kidney disease), stage III ([place])   . Diabetes type 2, controlled ([place])    a. since 2002   . Diabetic gastroparesis (H[facility])    a. chronic nausea/vomiting.  . Diverticulosis, sigmoid 07/2016  . Heavy menses    a. H/O IUD - expired in 2014 - remains in place.  . Hyperlipidemia    Intolerant of atorvastatin, rosuvastatin  . Hypertension   . Iron deficiency anemia   . Ischemic cardiomyopathy (resolved)    a. 03/2015 EF 30-35% post NSTEMI;  b. 04/2015 EF  55-65% on LV gram; c. 09/2017 Echo: EF 55-60%; d. 11/2018 Echo: Ef 60-65%.  . Obesity   . Tobacco abuse    a. quit 03/2015.  Marland Kitchen Uterine fibroid   . Vertigo   . Vitamin D deficiency     Past Surgical History:  Procedure Laterality Date  . APPENDECTOMY    . CARDIAC CATHETERIZATION  4/16   x2 stent [facility]  . CARDIAC CATHETERIZATION N/A 05/05/2015   Procedure: Left Heart Cath and Coronary Angiography;  Surgeon: Peter M Martinique, MD;  Location: [place] CV LAB;  Service: Cardiovascular;  Laterality: N/A;  . COLONOSCOPY WITH PROPOFOL N/A 07/31/2016   Procedure: COLONOSCOPY WITH PROPOFOL;  Surgeon: Lollie Sails, MD;  Location: [facility] ENDOSCOPY;  Service: Endoscopy;  Laterality: N/A;  . CORONARY BALLOON ANGIOPLASTY N/A 12/04/2018   Procedure: CORONARY BALLOON ANGIOPLASTY;  Surgeon: Wellington Hampshire, MD;  Location: [place] [facility];  Service: Cardiovascular;  Laterality: N/A;  . CORONARY STENT INTERVENTION N/A 01/12/2019   Procedure: CORONARY STENT INTERVENTION;  Surgeon: [name], MD;  Location: [place] [facility] LAB;  Service: Cardiovascular;  Laterality: N/A;  . ESOPHAGOGASTRODUODENOSCOPY (EGD) WITH PROPOFOL N/A 07/31/2016   Procedure: ESOPHAGOGASTRODUODENOSCOPY (EGD) WITH PROPOFOL;  Surgeon: Lollie Sails, MD;  Location[facility] ENDOSCOPY;  Service: Endoscopy;  Laterality: N/A;  . INTRAVASCULAR PRESSURE WIRE/FFR STUDY N/A 12/04/2018   Procedure:  INTRAVASCULAR PRESSURE WIRE/FFR STUDY;  Surgeon: Wellington Hampshire, MD;  Location: [place] CV LAB;  Service: Cardiovascular;  Laterality: N/A;  . LAPAROSCOPIC APPENDECTOMY N/A 08/07/2016   Procedure: APPENDECTOMY LAPAROSCOPIC;  Surgeon: Hubbard Robinson, MD;  Location: [facility] ORS;  Service: General;  Laterality: N/A;  . LEFT HEART CATH AND CORONARY ANGIOGRAPHY Left 12/04/2018   Procedure: LEFT HEART CATH AND CORONARY ANGIOGRAPHY;  Surgeon: Wellington Hampshire, MD;  Location: [place] [facility] LAB;  Service: Cardiovascular;  Laterality: Left;   . LEFT HEART CATH AND CORONARY ANGIOGRAPHY N/A 01/12/2019   Procedure: LEFT HEART CATH AND CORONARY ANGIOGRAPHY;  Surgeon: [name], MD;  Location: [place] [facility] LAB;  Service: Cardiovascular;  Laterality: N/A;  . MOUTH SURGERY      Family History  Problem Relation Age of Onset  . Diabetes Father   . Cancer Maternal Grandmother        lung  . Cancer Maternal Grandfather        prostate  . Diabetes Paternal Grandfather   . Diabetes Paternal Grandmother   . Cancer - Cervical Maternal Aunt     Social History:  reports that she quit smoking about 4 years ago. She quit after 15.00 years of use. She has never used smokeless tobacco. She reports that she does not drink alcohol or use drugs.  Allergies:  Allergies  Allergen Reactions  . Lisinopril Anaphylaxis, Itching and Swelling  . Rosemary Oil Anaphylaxis  . Shellfish Allergy Itching and Swelling  . Other Itching and Swelling    [place] seasoning  . Metformin And Related Diarrhea, Nausea And Vomiting and Rash    Medications reviewed.    ROS Full ROS performed and is otherwise negative other than what is stated in HPI   BP 134/78   Pulse 82   Temp 97.8 F (36.6 C) (Skin)   Ht 5\' 5"  (1.651 m)   Wt 268 lb (121.6 kg)   SpO2 97%   BMI 44.60 kg/m   Physical Exam Vitals signs and nursing note reviewed. Exam conducted with a chaperone present.  Constitutional:      General: She is not in acute distress.    Appearance: Normal appearance. She is obese. She is not toxic-appearing.  Eyes:     General: No scleral icterus.       Right eye: No discharge.        Left eye: No discharge.  Pulmonary:     Effort: Pulmonary effort is normal. No respiratory distress.     Breath sounds: No stridor.  Abdominal:     General: Abdomen is flat. There is no distension.     Palpations: There is no mass.     Tenderness: There is no left CVA tenderness, guarding or rebound.     Hernia: No hernia is present.     Comments: Mild  diffuse tenderness, no peritonitis  Neurological:     General: No focal deficit present.     Mental Status: She is alert and oriented to person, place, and time.  Psychiatric:        Mood and Affect: Mood normal.        Behavior: Behavior normal.        Thought Content: Thought content normal.        Judgment: Judgment normal.     Assessment/Plan: [age]-year-old female with intractable nausea, gastroparesis.  Every study seems to failed to identify the gallbladder as the culprit of her symptoms.  She did have CT,  ultrasound and HIDA scan.  She does have a history of marijuana abuse and gastroparesis.  I do think that she is better managed by GI.  For now we will do some Compazine on a as needed basis until she sees back to GI.  There is no need for any surgical interventions at this time  Greater than 50% of the 25 minutes  visit was spent in counseling/coordination of care   Caroleen Hamman, MD [place] Surgeon

## 2019-07-01 NOTE — Progress Notes (Deleted)
Cardiology Office Note:    Date:  07/01/2019   ID:  [name], [name] 06-29-1981, MRN [id number]  PCP:  Venita Lick, NP  Cardiologist:  Kathlyn Sacramento, MD  Electrophysiologist:  None   Referring MD: Venita Lick, NP   Chief Complaint: 38 yo female with PMH CAD, ischemic cardiomyopathy, HTN, HLD presents for 1 month follow up after titration of metoprolol/losartan for tachycardia and occasional chest discomfort.   History of Present Illness:    [name] [name] is a 38 y.o. female with a cardiac hx of CAD, ischemic cardiomyopathy with subsequent normalization of LV function, HTN, HLD. Additional pertinent PMH include DM2 diagnosed in 2002, diabetic gastroparesis with chronic nausea/vomiting, stage II-III CKD, obesity. She does hold her lasix when she is having episodes of gastroparesis/vomiting.   Cardiac history originates April 2016, hospitalized with NSTEMI with noted LV dysfunction and EF 30-35%. Cardiac cath revealing severe LCx and OM1 disease. DES to LCx and OM1. Follow up cath 04/2015 with patent stents, normal LV function. Stress test 02/2016 nonischemic and low risk.   Recurrent angina Dec 2019. Cardiac cath 12/04/18 with patent stent in OM1, focal in-stent restenosis in LCx at origin of OM2 and new significant stenosis in prox to mid RCA. Balloon angioplasty of mid LCx performed 12/04/18. She underwent staged RCA PCI with DES on 01/12/19. She had a 14 day event monitor 01/28/19 with notes of intermittent sinus tachycardia and rare PVCs.   She was last seen by Dr. Fletcher Anon 04/29/19 with complaint of tachycardia. Her metoprolol was increased and losartan decreased. She requires approval for PCSK9 as she is intolerant of rosuvastatin and atorvastatin and LDL >200.   She was admitted to [facility] 06/18/19-06/22/19 for intractable nausea and vomiting and abdominal pain. Underwent CT abdomen and HIDA. Seen by GI for gastroparesis. No recommendation for cholecystectomy. Seen in ED  06/25/19 for recurrent symptoms. Given IV phenergan, IV morphine. BP was noted to be elevated (190/166) but had not taken her anti-hypertensive medications.   Past Medical History:  Diagnosis Date  . CAD (coronary artery disease)    a. 03/2015 NSTEMI/PCI: LCX 131m (DES), OM1 100p (DES - vessel labeled Ramus in subsequent cath report). Minor irregs to LAD/RCA; b. 04/2015 Cath: LM nl, LAD 40p, RI patent stent, LCX patent stent, RCA nl, EF 55-65%; c. 02/2016 MV: basal inferolateral and mid inferolateral defect/infarct w/o ischemia. EF 55-65%; d. 11/2018 PCI: LM 30ost, LAD 30p, LCX 95p/m (PTCA->20%), OM1 10ost, OM2 40ost, RCA 85p.  Marland Kitchen Chronic abdominal pain   . Chronic diastolic (congestive) heart failure ([place])    a. 03/2015 Echo: EF 30-35%, mild concentric LVH, severe HK of inf, inflat, & lat walls, mod MR, mild TR;  b. 04/2015 LV Gram: EF 55-65%; c. 09/2017 Echo: EF 55-60%, no rwma. Mild MR; d. 11/2018 Echo: Ef 60-65%, no rwma.  . CKD (chronic kidney disease), stage III ([place])   . Diabetes type 2, controlled ([place])    a. since 2002   . Diabetic gastroparesis (HCC)    a. chronic nausea/vomiting.  . Diverticulosis, sigmoid 07/2016  . Heavy menses    a. H/O IUD - expired in 2014 - remains in place.  . Hyperlipidemia   . Hypertension   . Iron deficiency anemia   . Ischemic cardiomyopathy (resolved)    a. 03/2015 EF 30-35% post NSTEMI;  b. 04/2015 EF 55-65% on LV gram; c. 09/2017 Echo: EF 55-60%; d. 11/2018 Echo: Ef 60-65%.  . Obesity   . Tobacco abuse  a. quit 03/2015.  Marland Kitchen Uterine fibroid   . Vertigo   . Vitamin D deficiency     Past Surgical History:  Procedure Laterality Date  . APPENDECTOMY    . CARDIAC CATHETERIZATION  4/16   x2 stent [facility]  . CARDIAC CATHETERIZATION N/A 05/05/2015   Procedure: Left Heart Cath and Coronary Angiography;  Surgeon: Peter M Martinique, MD;  Location: [place] CV LAB;  Service: Cardiovascular;  Laterality: N/A;  . COLONOSCOPY WITH PROPOFOL N/A 07/31/2016   Procedure:  COLONOSCOPY WITH PROPOFOL;  Surgeon: Lollie Sails, MD;  Location: [facility] ENDOSCOPY;  Service: Endoscopy;  Laterality: N/A;  . CORONARY BALLOON ANGIOPLASTY N/A 12/04/2018   Procedure: CORONARY BALLOON ANGIOPLASTY;  Surgeon: Wellington Hampshire, MD;  Location: [place] CV LAB;  Service: Cardiovascular;  Laterality: N/A;  . CORONARY STENT INTERVENTION N/A 01/12/2019   Procedure: CORONARY STENT INTERVENTION;  Surgeon: [name], MD;  Location: [place] [facility] LAB;  Service: Cardiovascular;  Laterality: N/A;  . ESOPHAGOGASTRODUODENOSCOPY (EGD) WITH PROPOFOL N/A 07/31/2016   Procedure: ESOPHAGOGASTRODUODENOSCOPY (EGD) WITH PROPOFOL;  Surgeon: Lollie Sails, MD;  Location: [facility] ENDOSCOPY;  Service: Endoscopy;  Laterality: N/A;  . INTRAVASCULAR PRESSURE WIRE/FFR STUDY N/A 12/04/2018   Procedure: INTRAVASCULAR PRESSURE WIRE/FFR STUDY;  Surgeon: Wellington Hampshire, MD;  Location: [place] [facility];  Service: Cardiovascular;  Laterality: N/A;  . LAPAROSCOPIC APPENDECTOMY N/A 08/07/2016   Procedure: APPENDECTOMY LAPAROSCOPIC;  Surgeon: Hubbard Robinson, MD;  Location: [facility] ORS;  Service: General;  Laterality: N/A;  . LEFT HEART CATH AND CORONARY ANGIOGRAPHY Left 12/04/2018   Procedure: LEFT HEART CATH AND CORONARY ANGIOGRAPHY;  Surgeon: Wellington Hampshire, MD;  Location: [place] CV LAB;  Service: Cardiovascular;  Laterality: Left;  . LEFT HEART CATH AND CORONARY ANGIOGRAPHY N/A 01/12/2019   Procedure: LEFT HEART CATH AND CORONARY ANGIOGRAPHY;  Surgeon: [name], MD;  Location: [place] CV LAB;  Service: Cardiovascular;  Laterality: N/A;  . MOUTH SURGERY      Current Medications: No outpatient medications have been marked as taking for the 07/01/19 encounter (Appointment) with Theora Gianotti, NP.     Allergies:   Lisinopril, Rosemary oil, Shellfish allergy, Other, and Metformin and related   Social History   Socioeconomic History  . Marital status: Married     Spouse name: Loss adjuster, chartered  . Number of children: Not on file  . Years of education: Not on file  . Highest education level: Not on file  Occupational History  . Not on file  Social Needs  . Financial resource strain: Not on file  . Food insecurity    Worry: Not on file    Inability: Not on file  . Transportation needs    Medical: Not on file    Non-medical: Not on file  Tobacco Use  . Smoking status: Former Smoker    Years: 15.00    Quit date: 03/10/2015    Years since quitting: 4.3  . Smokeless tobacco: Never Used  Substance and Sexual Activity  . Alcohol use: No    Alcohol/week: 0.0 standard drinks  . Drug use: No  . Sexual activity: Yes    Birth control/protection: Implant  Lifestyle  . Physical activity    Days per week: Not on file    Minutes per session: Not on file  . Stress: Not on file  Relationships  . Social Herbalist on phone: Not on file    Gets together: Not on file    Attends  religious service: Not on file    Active member of club or organization: Not on file    Attends meetings of clubs or organizations: Not on file    Relationship status: Not on file  Other Topics Concern  . Not on file  Social History Narrative   Lives locally with daughter and husband.     Family History: The patient's family history includes Cancer in her maternal grandfather and maternal grandmother; Cancer - Cervical in her maternal aunt; Diabetes in her father, paternal grandfather, and paternal grandmother.  ROS:   Please see the history of present illness.    ROS All other systems reviewed and are negative.  EKGs/Labs/Other Studies Reviewed:    The following studies were reviewed today: Long term monitor 01/28/2019 14-day monitor:   Normal sinus rhythm with an average heart rate of 94 bpm.  Intermittent sinus tachycardia. Triggered events by the patient did not correlate with significant arrhythmia.  She was in sinus rhythm or sinus tachycardia during these  events. Rare PVCs.  Cath 01/12/19 DIAGNOSTIC Left Main  Ost LM lesion 30% stenosed  Ost LM lesion is 30% stenosed.  Left Anterior Descending  Prox LAD lesion 30% stenosed  Prox LAD lesion is 30% stenosed.  Left Circumflex  Prox Cx to Mid Cx lesion 20% stenosed  Prox Cx to Mid Cx lesion is 20% stenosed. The lesion is type C. The lesion was previously treated using a drug eluting stent . Previously placed stent displays restenosis.  First Obtuse Marginal Branch  Ost 1st Mrg to 1st Mrg lesion 10% stenosed  Ost 1st Mrg to 1st Mrg lesion is 10% stenosed. The lesion was previously treated.  Second Obtuse Marginal Branch  Ost 2nd Mrg to 2nd Mrg lesion 40% stenosed  Ost 2nd Mrg to 2nd Mrg lesion is 40% stenosed.  Right Coronary Artery  Prox RCA to Mid RCA lesion 85% stenosed  Prox RCA to Mid RCA lesion is 85% stenosed.  CONCLUSIONS Stable appearance of coronary arteries since 12/04/2018, with 80-90% mid RCA disease and 20-30% in-stent restenosis in mid LCx. Moderately to severely elevated left ventricular filling pressure. Successful PCI to mid RCA using Synergy 2.5 x 32 mm drug-eluting stent (postdilated to 2.8 mm) with 0% residual stenosis and TIMI-3 flow.  Echo 12/01/18 - Left ventricle: The cavity size was normal. There was moderate   concentric hypertrophy. Systolic function was normal. The   estimated ejection fraction was in the range of 60% to 65%. Wall   motion was normal; there were no regional wall motion   abnormalities. Left ventricular diastolic function parameters   were normal. - Pulmonary arteries: Systolic pressure could not be accurately   estimated.   EKG:  EKG is *** ordered today.  The ekg ordered today demonstrates ***  Recent Labs: 04/16/2019: B Natriuretic Peptide 50.0 06/25/2019: ALT 18; BUN 18; Creatinine, Ser 1.41; Hemoglobin 12.8; Platelets 446; Potassium 3.3; Sodium 141   Recent Lipid Panel    Component Value Date/Time   CHOL 307 (H) 04/21/2019 1406    CHOL 106 08/30/2015 1439   CHOL 133 03/31/2015 0225   TRIG 149 04/21/2019 1406   TRIG 46 08/30/2015 1439   TRIG 77 03/31/2015 0225   HDL 40 04/21/2019 1406   HDL 26 (L) 03/31/2015 0225   CHOLHDL 4.6 (H) 12/17/2018 1145   VLDL 9 08/30/2015 1439   VLDL 15 03/31/2015 0225   LDLCALC 237 (H) 04/21/2019 1406   LDLCALC 92 03/31/2015 0225   LDLDIRECT 163 (H)  12/17/2018 1145    Physical Exam:    VS:  There were no vitals taken for this visit.    Wt Readings from Last 3 Encounters:  06/30/19 268 lb (121.6 kg)  06/25/19 260 lb (117.9 kg)  06/20/19 263 lb (119.3 kg)     GEN: *** Well nourished, well developed in no acute distress HEENT: Normal NECK: No JVD; No carotid bruits LYMPHATICS: No lymphadenopathy CARDIAC: ***RRR, no murmurs, rubs, gallops RESPIRATORY:  *** Clear to auscultation without rales, wheezing or rhonchi  ABDOMEN: Soft, non-tender, non-distended MUSCULOSKELETAL:  No *** edema; No deformity  SKIN: Warm and dry NEUROLOGIC:  Alert and oriented x 3 PSYCHIATRIC:  Normal affect   ASSESSMENT:    No diagnosis found. PLAN:    In order of problems listed above:  1. ***   Medication Adjustments/Labs and Tests Ordered: Current medicines are reviewed at length with the patient today.  Concerns regarding medicines are outlined above.  No orders of the defined types were placed in this encounter.  No orders of the defined types were placed in this encounter.   There are no Patient Instructions on file for this visit.   Signed, Loel Dubonnet, NP  07/01/2019 10:15 AM    [place]

## 2019-07-05 ENCOUNTER — Ambulatory Visit: Payer: Self-pay | Admitting: Surgery

## 2019-07-08 ENCOUNTER — Telehealth: Payer: Self-pay | Admitting: *Deleted

## 2019-07-08 ENCOUNTER — Other Ambulatory Visit: Payer: Self-pay

## 2019-07-08 MED ORDER — GLIMEPIRIDE 4 MG PO TABS: 4.0000 mg | ORAL_TABLET | Freq: Every day | ORAL | 1 refills | Status: DC

## 2019-07-08 NOTE — Telephone Encounter (Signed)
LMOM that we are open for Cardiac Rehab if ready to return. We will continue with the Virtual if needed.

## 2019-07-08 NOTE — Telephone Encounter (Signed)
[facility] has tried to reach patient x 2 with no response.   I also called and left the patient a message to schedule appointment with [facility]. She needs to call [phone] to arrange.

## 2019-07-09 ENCOUNTER — Encounter: Payer: Self-pay | Admitting: *Deleted

## 2019-07-09 NOTE — Progress Notes (Signed)
Cardiac Individual Treatment Plan  Patient Details  Name: [name] [name] MRN: [id number] Date of Birth: [birth year] Referring Provider:     Cardiac Rehab from 01/21/2019 in [facility] Cardiac and Pulmonary Rehab  Referring Provider  End, Christiopher MD [Attending Cardiologist: Dr. Rogue Jury Arida]      Initial Encounter Date:    Cardiac Rehab from 01/21/2019 in [facility] Cardiac and Pulmonary Rehab  Date  01/21/19      Visit Diagnosis: No diagnosis found.  Patient's Home Medications on Admission:  Current Outpatient Medications:  .  amitriptyline (ELAVIL) 25 MG tablet, Take 25 mg by mouth at bedtime., Disp: , Rfl:  .  aspirin EC 81 MG tablet, Take 81 mg by mouth daily., Disp: , Rfl:  .  Cholecalciferol 2000 units TABS, Take 2,000 Units by mouth daily. , Disp: , Rfl:  .  clopidogrel (PLAVIX) 75 MG tablet, Take 1 tablet (75 mg total) by mouth daily., Disp: 30 tablet, Rfl: 0 .  DULoxetine (CYMBALTA) 30 MG capsule, Take 60 mg by mouth daily. , Disp: , Rfl: 3 .  etonogestrel (NEXPLANON) 68 MG IMPL implant, 1 each by Subdermal route once., Disp: , Rfl:  .  FARXIGA 10 MG TABS tablet, Take 10 mg by mouth daily. , Disp: , Rfl: 11 .  furosemide (LASIX) 40 MG tablet, Take 1 tablet (40 mg total) by mouth every other day., Disp: 30 tablet, Rfl: 5 .  glimepiride (AMARYL) 4 MG tablet, Take 1 tablet (4 mg total) by mouth daily., Disp: 90 tablet, Rfl: 1 .  insulin aspart (NOVOLOG) 100 UNIT/ML injection, Inject 10 Units into the skin 3 (three) times daily with meals., Disp: 10 mL, Rfl: 11 .  insulin glargine (LANTUS) 100 UNIT/ML injection, Inject 60 Units into the skin daily., Disp: , Rfl:  .  irbesartan (AVAPRO) 75 MG tablet, Take 1 tablet (75 mg total) by mouth daily., Disp: 30 tablet, Rfl: 6 .  isosorbide mononitrate (IMDUR) 30 MG 24 hr tablet, Take 2 tablets (60 mg total) by mouth daily., Disp: 90 tablet, Rfl: 3 .  loperamide (IMODIUM A-D) 2 MG tablet, Take 8-12 mg by mouth daily as needed for diarrhea or  loose stools., Disp: , Rfl:  .  meclizine (ANTIVERT) 25 MG tablet, Take 25 mg by mouth 3 (three) times daily as needed for dizziness., Disp: , Rfl:  .  metoCLOPramide (REGLAN) 10 MG tablet, Take 1 tablet (10 mg total) by mouth 3 (three) times daily with meals for 10 days., Disp: 30 tablet, Rfl: 0 .  metoprolol tartrate (LOPRESSOR) 100 MG tablet, Take 1 tablet (100 mg total) by mouth 2 (two) times daily., Disp: 60 tablet, Rfl: 6 .  mirtazapine (REMERON) 30 MG tablet, Take 60 mg by mouth daily. , Disp: , Rfl: 3 .  nitroGLYCERIN (NITROSTAT) 0.4 MG SL tablet, Take 1 tab under your tongue while sitting for chest pain, If no relief may repeat, one tab every 5 min up to 3 tabs total over 15 mins., Disp: 25 tablet, Rfl: 3 .  ondansetron (ZOFRAN) 4 MG tablet, Take 1 tablet (4 mg total) by mouth every 8 (eight) hours as needed for nausea or vomiting., Disp: 8 tablet, Rfl: 0 .  pantoprazole (PROTONIX) 40 MG tablet, Take 1 tablet (40 mg total) by mouth 2 (two) times daily., Disp: 60 tablet, Rfl: 0 .  polyethylene glycol (MIRALAX / GLYCOLAX) 17 g packet, Take 17 g by mouth daily as needed. , Disp: , Rfl:  .  prochlorperazine (COMPAZINE) 5 MG tablet, Take  1 tablet (5 mg total) by mouth every 6 (six) hours as needed for nausea or vomiting., Disp: 30 tablet, Rfl: 0 .  traMADol (ULTRAM) 50 MG tablet, Take 1 tablet (50 mg total) by mouth every 6 (six) hours as needed., Disp: 20 tablet, Rfl: 0  Past Medical History: Past Medical History:  Diagnosis Date  . CAD (coronary artery disease)    a. 03/2015 NSTEMI/PCI: LCX 1104m(DES), OM1 100p (DES - vessel labeled Ramus in subsequent cath report). Minor irregs to LAD/RCA; b. 04/2015 Cath: LM nl, LAD 40p, RI patent stent, LCX patent stent, RCA nl, EF 55-65%; c. 02/2016 MV: basal inferolateral and mid inferolateral defect/infarct w/o ischemia. EF 55-65%; d. 11/2018 PCI: LM 30ost, LAD 30p, LCX 95p/m (PTCA->20%), OM1 10ost, OM2 40ost, RCA 85p.  .Marland KitchenChronic abdominal pain   . Chronic  diastolic (congestive) heart failure ([place]    a. 03/2015 Echo: EF 30-35%, mild concentric LVH, severe HK of inf, inflat, & lat walls, mod MR, mild TR;  b. 04/2015 LV Gram: EF 55-65%; c. 09/2017 Echo: EF 55-60%, no rwma. Mild MR; d. 11/2018 Echo: Ef 60-65%, no rwma.  . CKD (chronic kidney disease), stage III ([place]   . Diabetes type 2, controlled ([place]    a. since 2002   . Diabetic gastroparesis (HCC)    a. chronic nausea/vomiting.  . Diverticulosis, sigmoid 07/2016  . Heavy menses    a. H/O IUD - expired in 2014 - remains in place.  . Hyperlipidemia    Intolerant of atorvastatin, rosuvastatin  . Hypertension   . Iron deficiency anemia   . Ischemic cardiomyopathy (resolved)    a. 03/2015 EF 30-35% post NSTEMI;  b. 04/2015 EF 55-65% on LV gram; c. 09/2017 Echo: EF 55-60%; d. 11/2018 Echo: Ef 60-65%.  . Obesity   . Tobacco abuse    a. quit 03/2015.  .Marland KitchenUterine fibroid   . Vertigo   . Vitamin D deficiency     Tobacco Use: Social History   Tobacco Use  Smoking Status Former Smoker  . Years: 15.00  . Quit date: 03/10/2015  . Years since quitting: 4.3  Smokeless Tobacco Never Used    Labs: Recent Review Flowsheet Data    Labs for ITP Cardiac and Pulmonary Rehab Latest Ref Rng & Units 01/22/2018 10/06/2018 12/17/2018 04/16/2019 04/21/2019   Cholestrol 100 - 199 mg/dL - 236(H) 245(H) - 307(H)   LDLCALC 0 - 99 mg/dL - 163(H) 165(H) - 237(H)   LDLDIRECT 0 - 99 mg/dL - - 163(H) - -   HDL >39 mg/dL - 39(L) 53 - 40   Trlycerides 0 - 149 mg/dL - 171(H) 137 - 149   Hemoglobin A1c 4.8 - 5.6 % 11.5(H) - - - 9.5(H)   HCO3 20.0 - 28.0 mmol/L - - - 23.3 -   O2SAT % - - - 90.3 -       Exercise Target Goals: Exercise Program Goal: Individual exercise prescription set using results from initial 6 min walk test and THRR while considering  patient's activity barriers and safety.   Exercise Prescription Goal: Initial exercise prescription builds to 30-45 minutes a day of aerobic activity, 2-3 days per  week.  Home exercise guidelines will be given to patient during program as part of exercise prescription that the participant will acknowledge.  Activity Barriers & Risk Stratification: Activity Barriers & Cardiac Risk Stratification - 01/21/19 1355      Activity Barriers & Cardiac Risk Stratification   Activity Barriers  Deconditioning;Muscular Weakness;Shortness  of Breath;Balance Concerns;Back Problems;Joint Problems;Decreased Ventricular Function   knees and back can be very painful (chronic), occasional dizzy spells   Cardiac Risk Stratification  High       6 Minute Walk: 6 Minute Walk    Row Name 01/21/19 1354         6 Minute Walk   Phase  Initial     Distance  850 feet     Walk Time  6 minutes     MPH  1.61     METS  3.5     RPE  12     Perceived Dyspnea   1     VO2 Peak  12.25     Symptoms  Yes (comment)     Comments  legs tired and painful 9/10     Resting HR  103 bpm     Resting BP  126/74     Resting Oxygen Saturation   96 %     Exercise Oxygen Saturation  during 6 min walk  97 %     Max Ex. HR  135 bpm     Max Ex. BP  156/74     2 Minute Post BP  130/70        Oxygen Initial Assessment:   Oxygen Re-Evaluation:   Oxygen Discharge (Final Oxygen Re-Evaluation):   Initial Exercise Prescription: Initial Exercise Prescription - 01/21/19 1300      Date of Initial Exercise RX and Referring Provider   Date  01/21/19    Referring Provider  End, Christiopher MD   Attending Cardiologist: Dr. Kathlyn [place]     Treadmill   MPH  1.4    Grade  0.5    Minutes  15    METs  2.17      REL-XR   Level  1    Speed  50    Minutes  15    METs  2.5      T5 Nustep   Level  2    SPM  80    Minutes  15    METs  2.5      Prescription Details   Frequency (times per week)  3    Duration  Progress to 45 minutes of aerobic exercise without signs/symptoms of physical distress      Intensity   THRR 40-80% of Max Heartrate  135-166    Ratings of Perceived  Exertion  11-13    Perceived Dyspnea  0-4      Progression   Progression  Continue to progress workloads to maintain intensity without signs/symptoms of physical distress.      Resistance Training   Training Prescription  Yes    Weight  3 lbs    Reps  10-15       Perform Capillary Blood Glucose checks as needed.  Exercise Prescription Changes: Exercise Prescription Changes    Row Name 01/21/19 1300             Response to Exercise   Blood Pressure (Admit)  126/74       Blood Pressure (Exercise)  156/74       Blood Pressure (Exit)  130/70       Heart Rate (Admit)  103 bpm       Heart Rate (Exercise)  135 bpm       Heart Rate (Exit)  108 bpm       Oxygen Saturation (Admit)  96 %       Oxygen Saturation (  Exercise)  97 %       Rating of Perceived Exertion (Exercise)  12       Perceived Dyspnea (Exercise)  1       Symptoms  legs tired and pain 9/10, SOB       Comments  walk test results          Exercise Comments: Exercise Comments    Row Name 02/01/19 1737 02/23/19 1148         Exercise Comments   First full day of exercise!  Patient was oriented to gym and equipment including functions, settings, policies, and procedures.  Patient's individual exercise prescription and treatment plan were reviewed.  All starting workloads were established based on the results of the 6 minute walk test done at initial orientation visit.  The plan for exercise progression was also introduced and progression will be customized based on patient's performance and goals.  Reviewed home exercise with pt today.  Pt plans to walk at home for exercise.  Reviewed THR, pulse, RPE, sign and symptoms, NTG use, and when to call 911 or MD.  Also discussed weather considerations and indoor options.  Pt voiced understanding.         Exercise Goals and Review: Exercise Goals    Row Name 01/21/19 1406             Exercise Goals   Increase Physical Activity  Yes       Intervention  Provide advice,  education, support and counseling about physical activity/exercise needs.;Develop an individualized exercise prescription for aerobic and resistive training based on initial evaluation findings, risk stratification, comorbidities and participant's personal goals.       Expected Outcomes  Short Term: Attend rehab on a regular basis to increase amount of physical activity.;Long Term: Exercising regularly at least 3-5 days a week.;Long Term: Add in home exercise to make exercise part of routine and to increase amount of physical activity.       Increase Strength and Stamina  Yes       Intervention  Provide advice, education, support and counseling about physical activity/exercise needs.;Develop an individualized exercise prescription for aerobic and resistive training based on initial evaluation findings, risk stratification, comorbidities and participant's personal goals.       Expected Outcomes  Short Term: Increase workloads from initial exercise prescription for resistance, speed, and METs.;Short Term: Perform resistance training exercises routinely during rehab and add in resistance training at home;Long Term: Improve cardiorespiratory fitness, muscular endurance and strength as measured by increased METs and functional capacity (6MWT)       Able to understand and use rate of perceived exertion (RPE) scale  Yes       Intervention  Provide education and explanation on how to use RPE scale       Expected Outcomes  Short Term: Able to use RPE daily in rehab to express subjective intensity level;Long Term:  Able to use RPE to guide intensity level when exercising independently       Knowledge and understanding of Target Heart Rate Range (THRR)  Yes       Intervention  Provide education and explanation of THRR including how the numbers were predicted and where they are located for reference       Expected Outcomes  Short Term: Able to state/look up THRR;Short Term: Able to use daily as guideline for intensity  in rehab;Long Term: Able to use THRR to govern intensity when exercising independently  Able to check pulse independently  Yes       Intervention  Provide education and demonstration on how to check pulse in carotid and radial arteries.;Review the importance of being able to check your own pulse for safety during independent exercise       Expected Outcomes  Short Term: Able to explain why pulse checking is important during independent exercise;Long Term: Able to check pulse independently and accurately       Understanding of Exercise Prescription  Yes       Intervention  Provide education, explanation, and written materials on patient's individual exercise prescription       Expected Outcomes  Short Term: Able to explain program exercise prescription;Long Term: Able to explain home exercise prescription to exercise independently          Exercise Goals Re-Evaluation : Exercise Goals Re-Evaluation    Row Name 02/01/19 1737 02/23/19 1146           Exercise Goal Re-Evaluation   Exercise Goals Review  Increase Physical Activity;Increase Strength and Stamina;Able to understand and use rate of perceived exertion (RPE) scale;Able to understand and use Dyspnea scale  Increase Physical Activity;Increase Strength and Stamina;Able to understand and use rate of perceived exertion (RPE) scale;Knowledge and understanding of Target Heart Rate Range (THRR);Able to check pulse independently;Understanding of Exercise Prescription      Comments  Reviewed RPE scale, THR and program prescription with pt today.  Pt voiced understanding and was given a copy of goals to take home.   Reviewed home exercise with pt today.  Pt plans to use gym at apartment complex for exercise.  Reviewed THR, pulse, RPE, sign and symptoms, NTG use, and when to call 911 or MD.  Also discussed weather considerations and indoor options.  Pt voiced understanding.  HT classes ar enot meeting at this time due to [place].        Expected  Outcomes  Short: Use RPE daily to regulate intensity. Long: Follow program prescription in THR.  Short - exercise on her own while missing classes Long - improve overall MET level         Discharge Exercise Prescription (Final Exercise Prescription Changes): Exercise Prescription Changes - 01/21/19 1300      Response to Exercise   Blood Pressure (Admit)  126/74    Blood Pressure (Exercise)  156/74    Blood Pressure (Exit)  130/70    Heart Rate (Admit)  103 bpm    Heart Rate (Exercise)  135 bpm    Heart Rate (Exit)  108 bpm    Oxygen Saturation (Admit)  96 %    Oxygen Saturation (Exercise)  97 %    Rating of Perceived Exertion (Exercise)  12    Perceived Dyspnea (Exercise)  1    Symptoms  legs tired and pain 9/10, SOB    Comments  walk test results       Nutrition:  Target Goals: Understanding of nutrition guidelines, daily intake of sodium '1500mg'$ , cholesterol '200mg'$ , calories 30% from fat and 7% or less from saturated fats, daily to have 5 or more servings of fruits and vegetables.  Biometrics: Pre Biometrics - 01/21/19 1407      Pre Biometrics   Height  5' 6.4" (1.687 m)    Weight  275 lb 12.8 oz (125.1 kg)    Waist Circumference  50 inches    Hip Circumference  52 inches    Waist to Hip Ratio  0.96 %  BMI (Calculated)  43.96    Single Leg Stand  4.58 seconds        Nutrition Therapy Plan and Nutrition Goals: Nutrition Therapy & Goals - 02/24/19 1241      Nutrition Therapy   Diet  Diabetes Heart Healthy Low Sodium       Nutrition Assessments: Nutrition Assessments - 01/21/19 1429      MEDFICTS Scores   Pre Score  24       Nutrition Goals Re-Evaluation:   Nutrition Goals Discharge (Final Nutrition Goals Re-Evaluation):   Psychosocial: Target Goals: Acknowledge presence or absence of significant depression and/or stress, maximize coping skills, provide positive support system. Participant is able to verbalize types and ability to use techniques and  skills needed for reducing stress and depression.   Initial Review & Psychosocial Screening: Initial Psych Review & Screening - 01/21/19 1426      Initial Review   Current issues with  Current Depression;Current Anxiety/Panic;Current Stress Concerns    Source of Stress Concerns  Chronic Illness;Unable to perform yard/household activities;Unable to participate in former interests or hobbies;Family    Comments  Bailea reports her quality of life is low due to inability to participate in some daily activities and hobbies related to her lack of stamina. She said her husband, who is a Administrator, and her 4 year old daughter are very supportive. She wants to work on her energy level while here      [place]?  Yes   husband, daughter, family     Barriers   Psychosocial barriers to participate in program  There are no identifiable barriers or psychosocial needs.;The patient should benefit from training in stress management and relaxation.      Screening Interventions   Interventions  Encouraged to exercise;Program counselor consult;To provide support and resources with identified psychosocial needs;Provide feedback about the scores to participant    Expected Outcomes  Short Term goal: Utilizing psychosocial counselor, staff and physician to assist with identification of specific Stressors or current issues interfering with healing process. Setting desired goal for each stressor or current issue identified.;Long Term Goal: Stressors or current issues are controlled or eliminated.;Short Term goal: Identification and review with participant of any Quality of Life or Depression concerns found by scoring the questionnaire.;Long Term goal: The participant improves quality of Life and PHQ9 Scores as seen by post scores and/or verbalization of changes       Quality of Life Scores:  Quality of Life - 01/21/19 1428      Quality of Life   Select  Quality of Life       Quality of Life Scores   Health/Function Pre  3.2 %    Socioeconomic Pre  15.43 %    Psych/Spiritual Pre  3.43 %    Family Pre  20.4 %    GLOBAL Pre  8.23 %      Scores of 19 and below usually indicate a poorer quality of life in these areas.  A difference of  2-3 points is a clinically meaningful difference.  A difference of 2-3 points in the total score of the Quality of Life Index has been associated with significant improvement in overall quality of life, self-image, physical symptoms, and general health in studies assessing change in quality of life.  PHQ-9: Recent Review Flowsheet Data    Depression screen [facility] 2/9 01/21/2019 10/06/2018 01/09/2018 08/18/2017 08/12/2017   Decreased Interest 3 0 0 1 2   Down,  Depressed, Hopeless '2 1 3 1 2   '$ PHQ - 2 Score '5 1 3 2 4   '$ Altered sleeping '3 1 3 '$ - 3   Tired, decreased energy '3 1 3 '$ - 2   Change in appetite '3 1 3 '$ - 2   Feeling bad or failure about yourself  '3 1 2 '$ - 2   Trouble concentrating '1 1 2 '$ - 2   Moving slowly or fidgety/restless 2 0 0 - 0   Suicidal thoughts 0 0 2 0 0   PHQ-9 Score '20 6 18 '$ - 15   Difficult doing work/chores Extremely dIfficult Very difficult - - -     Interpretation of Total Score  Total Score Depression Severity:  1-4 = Minimal depression, 5-9 = Mild depression, 10-14 = Moderate depression, 15-19 = Moderately severe depression, 20-27 = Severe depression   Psychosocial Evaluation and Intervention: Psychosocial Evaluation - 03/01/19 1043      Psychosocial Evaluation & Interventions   Comments  Chaplain attempted to reach patient to conduct initial psychosocial evaluation.  Message left requesting call back from patient.    Expected Outcomes  Counselor will continue to attempt to reach patient.    Continue Psychosocial Services   Follow up required by counselor       Psychosocial Re-Evaluation:   Psychosocial Discharge (Final Psychosocial Re-Evaluation):   Vocational Rehabilitation: Provide vocational rehab  assistance to qualifying candidates.   Vocational Rehab Evaluation & Intervention: Vocational Rehab - 01/21/19 1426      Initial Vocational Rehab Evaluation & Intervention   Assessment shows need for Vocational Rehabilitation  No       Education: Education Goals: Education classes will be provided on a variety of topics geared toward better understanding of heart health and risk factor modification. Participant will state understanding/return demonstration of topics presented as noted by education test scores.  Learning Barriers/Preferences: Learning Barriers/Preferences - 01/21/19 1425      Learning Barriers/Preferences   Learning Barriers  None    Learning Preferences  None       Education Topics:  AED/CPR: - Group verbal and written instruction with the use of models to demonstrate the basic use of the AED with the basic ABC's of resuscitation.   Cardiac Rehab from 02/01/2019 in [facility] Cardiac and Pulmonary Rehab  Date  02/01/19  Educator  CE  Instruction Review Code  1- Verbalizes Understanding      General Nutrition Guidelines/Fats and Fiber: -Group instruction provided by verbal, written material, models and posters to present the general guidelines for heart healthy nutrition. Gives an explanation and review of dietary fats and fiber.   Controlling Sodium/Reading Food Labels: -Group verbal and written material supporting the discussion of sodium use in heart healthy nutrition. Review and explanation with models, verbal and written materials for utilization of the food label.   Cardiac Rehab from 08/02/2015 in [facility] Cardiac and Pulmonary Rehab  Date  07/31/15  Educator  PI  Instruction Review Code (retired)  2- meets goals/outcomes      Exercise Physiology & General Exercise Guidelines: - Group verbal and written instruction with models to review the exercise physiology of the cardiovascular system and associated critical values. Provides general exercise guidelines  with specific guidelines to those with heart or lung disease.    Aerobic Exercise & Resistance Training: - Gives group verbal and written instruction on the various components of exercise. Focuses on aerobic and resistive training programs and the benefits of this training and how to safely progress  through these programs..   Flexibility, Balance, Mind/Body Relaxation: Provides group verbal/written instruction on the benefits of flexibility and balance training, including mind/body exercise modes such as yoga, pilates and tai chi.  Demonstration and skill practice provided.   Stress and Anxiety: - Provides group verbal and written instruction about the health risks of elevated stress and causes of high stress.  Discuss the correlation between heart/lung disease and anxiety and treatment options. Review healthy ways to manage with stress and anxiety.   Depression: - Provides group verbal and written instruction on the correlation between heart/lung disease and depressed mood, treatment options, and the stigmas associated with seeking treatment.   Cardiac Rehab from 08/02/2015 in [facility] Cardiac and Pulmonary Rehab  Date  08/02/15  Educator  [facility]  Instruction Review Code (retired)  2- meets Designer, fashion/clothing & Physiology of the Heart: - Group verbal and written instruction and models provide basic cardiac anatomy and physiology, with the coronary electrical and arterial systems. Review of Valvular disease and Heart Failure   Cardiac Procedures: - Group verbal and written instruction to review commonly prescribed medications for heart disease. Reviews the medication, class of the drug, and side effects. Includes the steps to properly store meds and maintain the prescription regimen. (beta blockers and nitrates)   Cardiac Medications I: - Group verbal and written instruction to review commonly prescribed medications for heart disease. Reviews the medication, class of the drug, and  side effects. Includes the steps to properly store meds and maintain the prescription regimen.   Cardiac Medications II: -Group verbal and written instruction to review commonly prescribed medications for heart disease. Reviews the medication, class of the drug, and side effects. (all other drug classes)    Go Sex-Intimacy & Heart Disease, Get SMART - Goal Setting: - Group verbal and written instruction through game format to discuss heart disease and the return to sexual intimacy. Provides group verbal and written material to discuss and apply goal setting through the application of the S.M.A.R.T. Method.   Other Matters of the Heart: - Provides group verbal, written materials and models to describe Stable Angina and Peripheral Artery. Includes description of the disease process and treatment options available to the cardiac patient.   Exercise & Equipment Safety: - Individual verbal instruction and demonstration of equipment use and safety with use of the equipment.   Cardiac Rehab from 02/01/2019 in [facility] Cardiac and Pulmonary Rehab  Date  01/21/19  Educator  [facility]  Instruction Review Code  1- Verbalizes Understanding      Infection Prevention: - Provides verbal and written material to individual with discussion of infection control including proper hand washing and proper equipment cleaning during exercise session.   Cardiac Rehab from 02/01/2019 in [facility] Cardiac and Pulmonary Rehab  Date  01/21/19  Educator  [facility]  Instruction Review Code  1- Verbalizes Understanding      Falls Prevention: - Provides verbal and written material to individual with discussion of falls prevention and safety.   Cardiac Rehab from 02/01/2019 in [facility] Cardiac and Pulmonary Rehab  Date  01/21/19  Educator  [facility]  Instruction Review Code  1- Verbalizes Understanding      Diabetes: - Individual verbal and written instruction to review signs/symptoms of diabetes, desired ranges of glucose level fasting, after  meals and with exercise. Acknowledge that pre and post exercise glucose checks will be done for 3 sessions at entry of program.   Cardiac Rehab from 02/01/2019 in [facility] Cardiac and  Pulmonary Rehab  Date  01/21/19  Educator  [facility]  Instruction Review Code  1- Verbalizes Understanding      Know Your Numbers and Risk Factors: -Group verbal and written instruction about important numbers in your health.  Discussion of what are risk factors and how they play a role in the disease process.  Review of Cholesterol, Blood Pressure, Diabetes, and BMI and the role they play in your overall health.   Sleep Hygiene: -Provides group verbal and written instruction about how sleep can affect your health.  Define sleep hygiene, discuss sleep cycles and impact of sleep habits. Review good sleep hygiene tips.    Other: -Provides group and verbal instruction on various topics (see comments)   Knowledge Questionnaire Score: Knowledge Questionnaire Score - 01/21/19 1425      Knowledge Questionnaire Score   Pre Score  24/26   correct answers reviewed with [place]. Focus on PAD and Nutrition       Core Components/Risk Factors/Patient Goals at Admission: Personal Goals and Risk Factors at Admission - 01/21/19 1424      Core Components/Risk Factors/Patient Goals on Admission    Weight Management  Yes;Obesity;Weight Loss    Intervention  Weight Management: Develop a combined nutrition and exercise program designed to reach desired caloric intake, while maintaining appropriate intake of nutrient and fiber, sodium and fats, and appropriate energy expenditure required for the weight goal.;Weight Management: Provide education and appropriate resources to help participant work on and attain dietary goals.;Weight Management/Obesity: Establish reasonable short term and long term weight goals.;Obesity: Provide education and appropriate resources to help participant work on and attain dietary goals.    Admit Weight  275  lb 12.8 oz (125.1 kg)    Goal Weight: Short Term  270 lb (122.5 kg)    Goal Weight: Long Term  140 lb (63.5 kg)    Expected Outcomes  Short Term: Continue to assess and modify interventions until short term weight is achieved;Long Term: Adherence to nutrition and physical activity/exercise program aimed toward attainment of established weight goal;Weight Loss: Understanding of general recommendations for a balanced deficit meal plan, which promotes 1-2 lb weight loss per week and includes a negative energy balance of [phone] kcal/d;Understanding recommendations for meals to include 15-35% energy as protein, 25-35% energy from fat, 35-60% energy from carbohydrates, less than '200mg'$  of dietary cholesterol, 20-35 gm of total fiber daily;Understanding of distribution of calorie intake throughout the day with the consumption of 4-5 meals/snacks    Diabetes  Yes    Intervention  Provide education about signs/symptoms and action to take for hypo/hyperglycemia.;Provide education about proper nutrition, including hydration, and aerobic/resistive exercise prescription along with prescribed medications to achieve blood glucose in normal ranges: Fasting glucose 65-99 mg/dL    Expected Outcomes  Short Term: Participant verbalizes understanding of the signs/symptoms and immediate care of hyper/hypoglycemia, proper foot care and importance of medication, aerobic/resistive exercise and nutrition plan for blood glucose control.;Long Term: Attainment of HbA1C < 7%.    Heart Failure  Yes    Intervention  Provide a combined exercise and nutrition program that is supplemented with education, support and counseling about heart failure. Directed toward relieving symptoms such as shortness of breath, decreased exercise tolerance, and extremity edema.    Expected Outcomes  Improve functional capacity of life;Short term: Attendance in program 2-3 days a week with increased exercise capacity. Reported lower sodium intake. Reported  increased fruit and vegetable intake. Reports medication compliance.;Short term: Daily weights obtained and reported for increase.  Utilizing diuretic protocols set by physician.;Long term: Adoption of self-care skills and reduction of barriers for early signs and symptoms recognition and intervention leading to self-care maintenance.    Hypertension  Yes    Intervention  Provide education on lifestyle modifcations including regular physical activity/exercise, weight management, moderate sodium restriction and increased consumption of fresh fruit, vegetables, and low fat dairy, alcohol moderation, and smoking cessation.;Monitor prescription use compliance.    Expected Outcomes  Short Term: Continued assessment and intervention until BP is < 140/42m HG in hypertensive participants. < 130/882mHG in hypertensive participants with diabetes, heart failure or chronic kidney disease.;Long Term: Maintenance of blood pressure at goal levels.    Lipids  Yes    Intervention  Provide education and support for participant on nutrition & aerobic/resistive exercise along with prescribed medications to achieve LDL '70mg'$ , HDL >'40mg'$ .    Expected Outcomes  Short Term: Participant states understanding of desired cholesterol values and is compliant with medications prescribed. Participant is following exercise prescription and nutrition guidelines.;Long Term: Cholesterol controlled with medications as prescribed, with individualized exercise RX and with personalized nutrition plan. Value goals: LDL < '70mg'$ , HDL > 40 mg.       Core Components/Risk Factors/Patient Goals Review:  Goals and Risk Factor Review    Row Name 02/23/19 1149             Core Components/Risk Factors/Patient Goals Review   Personal Goals Review  Diabetes;Hypertension;Other       Review  FeAmoraas an infusion tomorrow due to anemia.  She is taking all meds as directed and checking BG and BP at home.  They have been in normal range.  She is  walking up and down stairs at her home foe exercise.  She states she has a lot of pain but wants to move.  She will consult with Dr tomorrow about level of exercise considering infusion.       Expected Outcomes  Short - check with Dr about exercise due to infusion Long - improve exercise level          Core Components/Risk Factors/Patient Goals at Discharge (Final Review):  Goals and Risk Factor Review - 02/23/19 1149      Core Components/Risk Factors/Patient Goals Review   Personal Goals Review  Diabetes;Hypertension;Other    Review  FeAilanaas an infusion tomorrow due to anemia.  She is taking all meds as directed and checking BG and BP at home.  They have been in normal range.  She is walking up and down stairs at her home foe exercise.  She states she has a lot of pain but wants to move.  She will consult with Dr tomorrow about level of exercise considering infusion.    Expected Outcomes  Short - check with Dr about exercise due to infusion Long - improve exercise level       ITP Comments: ITP Comments    Row Name 02/03/19 0620 02/26/19 0809 03/03/19 1304 03/03/19 1308 06/23/19 1116   ITP Comments  30 day review. Continue with ITP unless directed changes by Medical Director chart review. New to program  Our program is currently closed due to COVID-19.  We are communicating with patient via phone calls and emails.   30 day review. Continue with ITP unless directed changes by Medical Director chart review.   30 day review. Continue with ITP unless directed changes by Medical Director chart review.   30 day review cycle restarting  after being closed since March  16 because of  Covid 19 pandemic. Program opened to patients on July 6. Not all have returned. ITP updated and sent to Medical Director for review,changes as needed and signature   [place] Name 06/23/19 1122 07/09/19 0952         ITP Comments  30 day review cycle restarting after being closed since March 16 because of Covid 19 pandemic.  Program opened to patients on July 6. Not all have returned. ITP updated and sent to Medical Director for review,changes as needed and signature  No response to several calls since we have been closed and reopening.  WIll discharge today.         Comments: Discharge ITP

## 2019-07-09 NOTE — Progress Notes (Signed)
Discharge Progress Report  Patient Details  Name: [name] [name] MRN: [id number] Date of Birth: March 31, 1981 Referring Provider:     Cardiac Rehab from 01/21/2019 in [facility] Cardiac and Pulmonary Rehab  Referring Provider  End, Christiopher MD [Attending Cardiologist: Dr. Rogue Jury Arida]       Number of Visits: 3  Reason for Discharge:  Early Exit:  Personal  Smoking History:  Social History   Tobacco Use  Smoking Status Former Smoker  . Years: 15.00  . Quit date: 03/10/2015  . Years since quitting: 4.3  Smokeless Tobacco Never Used    Diagnosis:  No diagnosis found.  ADL UCSD:   Initial Exercise Prescription: Initial Exercise Prescription - 01/21/19 1300      Date of Initial Exercise RX and Referring Provider   Date  01/21/19    Referring Provider  End, Christiopher MD   Attending Cardiologist: Dr. Kathlyn [place]     Treadmill   MPH  1.4    Grade  0.5    Minutes  15    METs  2.17      REL-XR   Level  1    Speed  50    Minutes  15    METs  2.5      T5 Nustep   Level  2    SPM  80    Minutes  15    METs  2.5      Prescription Details   Frequency (times per week)  3    Duration  Progress to 45 minutes of aerobic exercise without signs/symptoms of physical distress      Intensity   THRR 40-80% of Max Heartrate  135-166    Ratings of Perceived Exertion  11-13    Perceived Dyspnea  0-4      Progression   Progression  Continue to progress workloads to maintain intensity without signs/symptoms of physical distress.      Resistance Training   Training Prescription  Yes    Weight  3 lbs    Reps  10-15       Discharge Exercise Prescription (Final Exercise Prescription Changes): Exercise Prescription Changes - 01/21/19 1300      Response to Exercise   Blood Pressure (Admit)  126/74    Blood Pressure (Exercise)  156/74    Blood Pressure (Exit)  130/70    Heart Rate (Admit)  103 bpm    Heart Rate (Exercise)  135 bpm    Heart Rate (Exit)  108 bpm    Oxygen Saturation (Admit)  96 %    Oxygen Saturation (Exercise)  97 %    Rating of Perceived Exertion (Exercise)  12    Perceived Dyspnea (Exercise)  1    Symptoms  legs tired and pain 9/10, SOB    Comments  walk test results       Functional Capacity: 6 Minute Walk    Row Name 01/21/19 1354         6 Minute Walk   Phase  Initial     Distance  850 feet     Walk Time  6 minutes     MPH  1.61     METS  3.5     RPE  12     Perceived Dyspnea   1     VO2 Peak  12.25     Symptoms  Yes (comment)     Comments  legs tired and painful 9/10     Resting HR  103  bpm     Resting BP  126/74     Resting Oxygen Saturation   96 %     Exercise Oxygen Saturation  during 6 min walk  97 %     Max Ex. HR  135 bpm     Max Ex. BP  156/74     2 Minute Post BP  130/70        Psychological, QOL, Others - Outcomes: PHQ 2/9: Depression screen [facility] 2/9 01/21/2019 10/06/2018 01/09/2018 08/18/2017 08/12/2017  Decreased Interest 3 0 0 1 2  Down, Depressed, Hopeless '2 1 3 1 2  '$ PHQ - 2 Score '5 1 3 2 4  '$ Altered sleeping '3 1 3 '$ - 3  Tired, decreased energy '3 1 3 '$ - 2  Change in appetite '3 1 3 '$ - 2  Feeling bad or failure about yourself  '3 1 2 '$ - 2  Trouble concentrating '1 1 2 '$ - 2  Moving slowly or fidgety/restless 2 0 0 - 0  Suicidal thoughts 0 0 2 0 0  PHQ-9 Score '20 6 18 '$ - 15  Difficult doing work/chores Extremely dIfficult Very difficult - - -  Some recent data might be hidden    Quality of Life: Quality of Life - 01/21/19 1428      Quality of Life   Select  Quality of Life      Quality of Life Scores   Health/Function Pre  3.2 %    Socioeconomic Pre  15.43 %    Psych/Spiritual Pre  3.43 %    Family Pre  20.4 %    GLOBAL Pre  8.23 %       Personal Goals: Goals established at orientation with interventions provided to work toward goal. Personal Goals and Risk Factors at Admission - 01/21/19 1424      Core Components/Risk Factors/Patient Goals on Admission    Weight Management   Yes;Obesity;Weight Loss    Intervention  Weight Management: Develop a combined nutrition and exercise program designed to reach desired caloric intake, while maintaining appropriate intake of nutrient and fiber, sodium and fats, and appropriate energy expenditure required for the weight goal.;Weight Management: Provide education and appropriate resources to help participant work on and attain dietary goals.;Weight Management/Obesity: Establish reasonable short term and long term weight goals.;Obesity: Provide education and appropriate resources to help participant work on and attain dietary goals.    Admit Weight  275 lb 12.8 oz (125.1 kg)    Goal Weight: Short Term  270 lb (122.5 kg)    Goal Weight: Long Term  140 lb (63.5 kg)    Expected Outcomes  Short Term: Continue to assess and modify interventions until short term weight is achieved;Long Term: Adherence to nutrition and physical activity/exercise program aimed toward attainment of established weight goal;Weight Loss: Understanding of general recommendations for a balanced deficit meal plan, which promotes 1-2 lb weight loss per week and includes a negative energy balance of [phone] kcal/d;Understanding recommendations for meals to include 15-35% energy as protein, 25-35% energy from fat, 35-60% energy from carbohydrates, less than '200mg'$  of dietary cholesterol, 20-35 gm of total fiber daily;Understanding of distribution of calorie intake throughout the day with the consumption of 4-5 meals/snacks    Diabetes  Yes    Intervention  Provide education about signs/symptoms and action to take for hypo/hyperglycemia.;Provide education about proper nutrition, including hydration, and aerobic/resistive exercise prescription along with prescribed medications to achieve blood glucose in normal ranges: Fasting glucose 65-99 mg/dL    Expected  Outcomes  Short Term: Participant verbalizes understanding of the signs/symptoms and immediate care of hyper/hypoglycemia,  proper foot care and importance of medication, aerobic/resistive exercise and nutrition plan for blood glucose control.;Long Term: Attainment of HbA1C < 7%.    Heart Failure  Yes    Intervention  Provide a combined exercise and nutrition program that is supplemented with education, support and counseling about heart failure. Directed toward relieving symptoms such as shortness of breath, decreased exercise tolerance, and extremity edema.    Expected Outcomes  Improve functional capacity of life;Short term: Attendance in program 2-3 days a week with increased exercise capacity. Reported lower sodium intake. Reported increased fruit and vegetable intake. Reports medication compliance.;Short term: Daily weights obtained and reported for increase. Utilizing diuretic protocols set by physician.;Long term: Adoption of self-care skills and reduction of barriers for early signs and symptoms recognition and intervention leading to self-care maintenance.    Hypertension  Yes    Intervention  Provide education on lifestyle modifcations including regular physical activity/exercise, weight management, moderate sodium restriction and increased consumption of fresh fruit, vegetables, and low fat dairy, alcohol moderation, and smoking cessation.;Monitor prescription use compliance.    Expected Outcomes  Short Term: Continued assessment and intervention until BP is < 140/40m HG in hypertensive participants. < 130/853mHG in hypertensive participants with diabetes, heart failure or chronic kidney disease.;Long Term: Maintenance of blood pressure at goal levels.    Lipids  Yes    Intervention  Provide education and support for participant on nutrition & aerobic/resistive exercise along with prescribed medications to achieve LDL '70mg'$ , HDL >'40mg'$ .    Expected Outcomes  Short Term: Participant states understanding of desired cholesterol values and is compliant with medications prescribed. Participant is following exercise  prescription and nutrition guidelines.;Long Term: Cholesterol controlled with medications as prescribed, with individualized exercise RX and with personalized nutrition plan. Value goals: LDL < '70mg'$ , HDL > 40 mg.        Personal Goals Discharge: Goals and Risk Factor Review    Row Name 02/23/19 1149             Core Components/Risk Factors/Patient Goals Review   Personal Goals Review  Diabetes;Hypertension;Other       Review  FeLurlineas an infusion tomorrow due to anemia.  [name] [name] is taking all meds as directed and checking BG and BP at home.  They have been in normal range.  [name] [name] is walking up and down stairs at her home foe exercise.  [name] [name] states [name] [name] has a lot of pain but wants to move.  [name] [name] will consult with Dr tomorrow about level of exercise considering infusion.       Expected Outcomes  Short - check with Dr about exercise due to infusion Long - improve exercise level          Exercise Goals and Review: Exercise Goals    Row Name 01/21/19 1406             Exercise Goals   Increase Physical Activity  Yes       Intervention  Provide advice, education, support and counseling about physical activity/exercise needs.;Develop an individualized exercise prescription for aerobic and resistive training based on initial evaluation findings, risk stratification, comorbidities and participant's personal goals.       Expected Outcomes  Short Term: Attend rehab on a regular basis to increase amount of physical activity.;Long Term: Exercising regularly at least 3-5 days a week.;Long Term: Add in home exercise to make exercise part of routine  and to increase amount of physical activity.       Increase Strength and Stamina  Yes       Intervention  Provide advice, education, support and counseling about physical activity/exercise needs.;Develop an individualized exercise prescription for aerobic and resistive training based on initial evaluation findings, risk stratification, comorbidities and  participant's personal goals.       Expected Outcomes  Short Term: Increase workloads from initial exercise prescription for resistance, speed, and METs.;Short Term: Perform resistance training exercises routinely during rehab and add in resistance training at home;Long Term: Improve cardiorespiratory fitness, muscular endurance and strength as measured by increased METs and functional capacity (6MWT)       Able to understand and use rate of perceived exertion (RPE) scale  Yes       Intervention  Provide education and explanation on how to use RPE scale       Expected Outcomes  Short Term: Able to use RPE daily in rehab to express subjective intensity level;Long Term:  Able to use RPE to guide intensity level when exercising independently       Knowledge and understanding of Target Heart Rate Range (THRR)  Yes       Intervention  Provide education and explanation of THRR including how the numbers were predicted and where they are located for reference       Expected Outcomes  Short Term: Able to state/look up THRR;Short Term: Able to use daily as guideline for intensity in rehab;Long Term: Able to use THRR to govern intensity when exercising independently       Able to check pulse independently  Yes       Intervention  Provide education and demonstration on how to check pulse in carotid and radial arteries.;Review the importance of being able to check your own pulse for safety during independent exercise       Expected Outcomes  Short Term: Able to explain why pulse checking is important during independent exercise;Long Term: Able to check pulse independently and accurately       Understanding of Exercise Prescription  Yes       Intervention  Provide education, explanation, and written materials on patient's individual exercise prescription       Expected Outcomes  Short Term: Able to explain program exercise prescription;Long Term: Able to explain home exercise prescription to exercise independently           Exercise Goals Re-Evaluation: Exercise Goals Re-Evaluation    Row Name 02/01/19 1737 02/23/19 1146           Exercise Goal Re-Evaluation   Exercise Goals Review  Increase Physical Activity;Increase Strength and Stamina;Able to understand and use rate of perceived exertion (RPE) scale;Able to understand and use Dyspnea scale  Increase Physical Activity;Increase Strength and Stamina;Able to understand and use rate of perceived exertion (RPE) scale;Knowledge and understanding of Target Heart Rate Range (THRR);Able to check pulse independently;Understanding of Exercise Prescription      Comments  Reviewed RPE scale, THR and program prescription with pt today.  Pt voiced understanding and was given a copy of goals to take home.   Reviewed home exercise with pt today.  Pt plans to use gym at apartment complex for exercise.  Reviewed THR, pulse, RPE, sign and symptoms, NTG use, and when to call 911 or MD.  Also discussed weather considerations and indoor options.  Pt voiced understanding.  HT classes ar enot meeting at this time due to [place] 19.  Expected Outcomes  Short: Use RPE daily to regulate intensity. Long: Follow program prescription in THR.  Short - exercise on her own while missing classes Long - improve overall MET level         Nutrition & Weight - Outcomes: Pre Biometrics - 01/21/19 1407      Pre Biometrics   Height  5' 6.4" (1.687 m)    Weight  275 lb 12.8 oz (125.1 kg)    Waist Circumference  50 inches    Hip Circumference  52 inches    Waist to Hip Ratio  0.96 %    BMI (Calculated)  43.96    Single Leg Stand  4.58 seconds        Nutrition: Nutrition Therapy & Goals - 02/24/19 1241      Nutrition Therapy   Diet  Diabetes Heart Healthy Low Sodium       Nutrition Discharge: Nutrition Assessments - 01/21/19 1429      MEDFICTS Scores   Pre Score  24       Education Questionnaire Score: Knowledge Questionnaire Score - 01/21/19 1425      Knowledge  Questionnaire Score   Pre Score  24/26   correct answers reviewed with [name] [name]. Focus on PAD and Nutrition       Goals reviewed with patient; copy given to patient.

## 2019-07-09 NOTE — Progress Notes (Signed)
Cardiac Individual Treatment Plan  Patient Details  Name: [name] [name] MRN: [id number] Date of Birth: [birth year] Referring Provider:     Cardiac Rehab from 01/21/2019 in [facility] Cardiac and Pulmonary Rehab  Referring Provider  End, Christiopher MD [Attending Cardiologist: Dr. Rogue Jury Arida]      Initial Encounter Date:    Cardiac Rehab from 01/21/2019 in [facility] Cardiac and Pulmonary Rehab  Date  01/21/19      Visit Diagnosis: No diagnosis found.  Patient's Home Medications on Admission:  Current Outpatient Medications:  .  amitriptyline (ELAVIL) 25 MG tablet, Take 25 mg by mouth at bedtime., Disp: , Rfl:  .  aspirin EC 81 MG tablet, Take 81 mg by mouth daily., Disp: , Rfl:  .  Cholecalciferol 2000 units TABS, Take 2,000 Units by mouth daily. , Disp: , Rfl:  .  clopidogrel (PLAVIX) 75 MG tablet, Take 1 tablet (75 mg total) by mouth daily., Disp: 30 tablet, Rfl: 0 .  DULoxetine (CYMBALTA) 30 MG capsule, Take 60 mg by mouth daily. , Disp: , Rfl: 3 .  etonogestrel (NEXPLANON) 68 MG IMPL implant, 1 each by Subdermal route once., Disp: , Rfl:  .  FARXIGA 10 MG TABS tablet, Take 10 mg by mouth daily. , Disp: , Rfl: 11 .  furosemide (LASIX) 40 MG tablet, Take 1 tablet (40 mg total) by mouth every other day., Disp: 30 tablet, Rfl: 5 .  glimepiride (AMARYL) 4 MG tablet, Take 1 tablet (4 mg total) by mouth daily., Disp: 90 tablet, Rfl: 1 .  insulin aspart (NOVOLOG) 100 UNIT/ML injection, Inject 10 Units into the skin 3 (three) times daily with meals., Disp: 10 mL, Rfl: 11 .  insulin glargine (LANTUS) 100 UNIT/ML injection, Inject 60 Units into the skin daily., Disp: , Rfl:  .  irbesartan (AVAPRO) 75 MG tablet, Take 1 tablet (75 mg total) by mouth daily., Disp: 30 tablet, Rfl: 6 .  isosorbide mononitrate (IMDUR) 30 MG 24 hr tablet, Take 2 tablets (60 mg total) by mouth daily., Disp: 90 tablet, Rfl: 3 .  loperamide (IMODIUM A-D) 2 MG tablet, Take 8-12 mg by mouth daily as needed for diarrhea or  loose stools., Disp: , Rfl:  .  meclizine (ANTIVERT) 25 MG tablet, Take 25 mg by mouth 3 (three) times daily as needed for dizziness., Disp: , Rfl:  .  metoCLOPramide (REGLAN) 10 MG tablet, Take 1 tablet (10 mg total) by mouth 3 (three) times daily with meals for 10 days., Disp: 30 tablet, Rfl: 0 .  metoprolol tartrate (LOPRESSOR) 100 MG tablet, Take 1 tablet (100 mg total) by mouth 2 (two) times daily., Disp: 60 tablet, Rfl: 6 .  mirtazapine (REMERON) 30 MG tablet, Take 60 mg by mouth daily. , Disp: , Rfl: 3 .  nitroGLYCERIN (NITROSTAT) 0.4 MG SL tablet, Take 1 tab under your tongue while sitting for chest pain, If no relief may repeat, one tab every 5 min up to 3 tabs total over 15 mins., Disp: 25 tablet, Rfl: 3 .  ondansetron (ZOFRAN) 4 MG tablet, Take 1 tablet (4 mg total) by mouth every 8 (eight) hours as needed for nausea or vomiting., Disp: 8 tablet, Rfl: 0 .  pantoprazole (PROTONIX) 40 MG tablet, Take 1 tablet (40 mg total) by mouth 2 (two) times daily., Disp: 60 tablet, Rfl: 0 .  polyethylene glycol (MIRALAX / GLYCOLAX) 17 g packet, Take 17 g by mouth daily as needed. , Disp: , Rfl:  .  prochlorperazine (COMPAZINE) 5 MG tablet, Take  1 tablet (5 mg total) by mouth every 6 (six) hours as needed for nausea or vomiting., Disp: 30 tablet, Rfl: 0 .  traMADol (ULTRAM) 50 MG tablet, Take 1 tablet (50 mg total) by mouth every 6 (six) hours as needed., Disp: 20 tablet, Rfl: 0  Past Medical History: Past Medical History:  Diagnosis Date  . CAD (coronary artery disease)    a. 03/2015 NSTEMI/PCI: LCX 1104m(DES), OM1 100p (DES - vessel labeled Ramus in subsequent cath report). Minor irregs to LAD/RCA; b. 04/2015 Cath: LM nl, LAD 40p, RI patent stent, LCX patent stent, RCA nl, EF 55-65%; c. 02/2016 MV: basal inferolateral and mid inferolateral defect/infarct w/o ischemia. EF 55-65%; d. 11/2018 PCI: LM 30ost, LAD 30p, LCX 95p/m (PTCA->20%), OM1 10ost, OM2 40ost, RCA 85p.  .Marland KitchenChronic abdominal pain   . Chronic  diastolic (congestive) heart failure ([place]    a. 03/2015 Echo: EF 30-35%, mild concentric LVH, severe HK of inf, inflat, & lat walls, mod MR, mild TR;  b. 04/2015 LV Gram: EF 55-65%; c. 09/2017 Echo: EF 55-60%, no rwma. Mild MR; d. 11/2018 Echo: Ef 60-65%, no rwma.  . CKD (chronic kidney disease), stage III ([place]   . Diabetes type 2, controlled ([place]    a. since 2002   . Diabetic gastroparesis (HCC)    a. chronic nausea/vomiting.  . Diverticulosis, sigmoid 07/2016  . Heavy menses    a. H/O IUD - expired in 2014 - remains in place.  . Hyperlipidemia    Intolerant of atorvastatin, rosuvastatin  . Hypertension   . Iron deficiency anemia   . Ischemic cardiomyopathy (resolved)    a. 03/2015 EF 30-35% post NSTEMI;  b. 04/2015 EF 55-65% on LV gram; c. 09/2017 Echo: EF 55-60%; d. 11/2018 Echo: Ef 60-65%.  . Obesity   . Tobacco abuse    a. quit 03/2015.  .Marland KitchenUterine fibroid   . Vertigo   . Vitamin D deficiency     Tobacco Use: Social History   Tobacco Use  Smoking Status Former Smoker  . Years: 15.00  . Quit date: 03/10/2015  . Years since quitting: 4.3  Smokeless Tobacco Never Used    Labs: Recent Review Flowsheet Data    Labs for ITP Cardiac and Pulmonary Rehab Latest Ref Rng & Units 01/22/2018 10/06/2018 12/17/2018 04/16/2019 04/21/2019   Cholestrol 100 - 199 mg/dL - 236(H) 245(H) - 307(H)   LDLCALC 0 - 99 mg/dL - 163(H) 165(H) - 237(H)   LDLDIRECT 0 - 99 mg/dL - - 163(H) - -   HDL >39 mg/dL - 39(L) 53 - 40   Trlycerides 0 - 149 mg/dL - 171(H) 137 - 149   Hemoglobin A1c 4.8 - 5.6 % 11.5(H) - - - 9.5(H)   HCO3 20.0 - 28.0 mmol/L - - - 23.3 -   O2SAT % - - - 90.3 -       Exercise Target Goals: Exercise Program Goal: Individual exercise prescription set using results from initial 6 min walk test and THRR while considering  patient's activity barriers and safety.   Exercise Prescription Goal: Initial exercise prescription builds to 30-45 minutes a day of aerobic activity, 2-3 days per  week.  Home exercise guidelines will be given to patient during program as part of exercise prescription that the participant will acknowledge.  Activity Barriers & Risk Stratification: Activity Barriers & Cardiac Risk Stratification - 01/21/19 1355      Activity Barriers & Cardiac Risk Stratification   Activity Barriers  Deconditioning;Muscular Weakness;Shortness  of Breath;Balance Concerns;Back Problems;Joint Problems;Decreased Ventricular Function   knees and back can be very painful (chronic), occasional dizzy spells   Cardiac Risk Stratification  High       6 Minute Walk: 6 Minute Walk    Row Name 01/21/19 1354         6 Minute Walk   Phase  Initial     Distance  850 feet     Walk Time  6 minutes     MPH  1.61     METS  3.5     RPE  12     Perceived Dyspnea   1     VO2 Peak  12.25     Symptoms  Yes (comment)     Comments  legs tired and painful 9/10     Resting HR  103 bpm     Resting BP  126/74     Resting Oxygen Saturation   96 %     Exercise Oxygen Saturation  during 6 min walk  97 %     Max Ex. HR  135 bpm     Max Ex. BP  156/74     2 Minute Post BP  130/70        Oxygen Initial Assessment:   Oxygen Re-Evaluation:   Oxygen Discharge (Final Oxygen Re-Evaluation):   Initial Exercise Prescription: Initial Exercise Prescription - 01/21/19 1300      Date of Initial Exercise RX and Referring Provider   Date  01/21/19    Referring Provider  End, Christiopher MD   Attending Cardiologist: Dr. Kathlyn [place]     Treadmill   MPH  1.4    Grade  0.5    Minutes  15    METs  2.17      REL-XR   Level  1    Speed  50    Minutes  15    METs  2.5      T5 Nustep   Level  2    SPM  80    Minutes  15    METs  2.5      Prescription Details   Frequency (times per week)  3    Duration  Progress to 45 minutes of aerobic exercise without signs/symptoms of physical distress      Intensity   THRR 40-80% of Max Heartrate  135-166    Ratings of Perceived  Exertion  11-13    Perceived Dyspnea  0-4      Progression   Progression  Continue to progress workloads to maintain intensity without signs/symptoms of physical distress.      Resistance Training   Training Prescription  Yes    Weight  3 lbs    Reps  10-15       Perform Capillary Blood Glucose checks as needed.  Exercise Prescription Changes: Exercise Prescription Changes    Row Name 01/21/19 1300             Response to Exercise   Blood Pressure (Admit)  126/74       Blood Pressure (Exercise)  156/74       Blood Pressure (Exit)  130/70       Heart Rate (Admit)  103 bpm       Heart Rate (Exercise)  135 bpm       Heart Rate (Exit)  108 bpm       Oxygen Saturation (Admit)  96 %       Oxygen Saturation (  Exercise)  97 %       Rating of Perceived Exertion (Exercise)  12       Perceived Dyspnea (Exercise)  1       Symptoms  legs tired and pain 9/10, SOB       Comments  walk test results          Exercise Comments: Exercise Comments    Row Name 02/01/19 1737 02/23/19 1148         Exercise Comments   First full day of exercise!  Patient was oriented to gym and equipment including functions, settings, policies, and procedures.  Patient's individual exercise prescription and treatment plan were reviewed.  All starting workloads were established based on the results of the 6 minute walk test done at initial orientation visit.  The plan for exercise progression was also introduced and progression will be customized based on patient's performance and goals.  Reviewed home exercise with pt today.   exercise.  Reviewed THR, pulse, RPE, sign and symptoms, NTG use, and when to call 911 or MD.  Also discussed weather considerations and indoor options.  Pt voiced understanding.         Exercise Goals and Review: Exercise Goals    Row Name 01/21/19 1406             Exercise Goals   Increase Physical Activity  Yes       Intervention  Provide advice, education, support and  counseling about physical activity/exercise needs.;Develop an individualized exercise prescription for aerobic and resistive training based on initial evaluation findings, risk stratification, comorbidities and participant's personal goals.       Expected Outcomes  Short Term: Attend rehab on a regular basis to increase amount of physical activity.;Long Term: Exercising regularly at least 3-5 days a week.;Long Term: Add in home exercise to make exercise part of routine and to increase amount of physical activity.       Increase Strength and Stamina  Yes       Intervention  Provide advice, education, support and counseling about physical activity/exercise needs.;Develop an individualized exercise prescription for aerobic and resistive training based on initial evaluation findings, risk stratification, comorbidities and participant's personal goals.       Expected Outcomes  Short Term: Increase workloads from initial exercise prescription for resistance, speed, and METs.;Short Term: Perform resistance training exercises routinely during rehab and add in resistance training at home;Long Term: Improve cardiorespiratory fitness, muscular endurance and strength as measured by increased METs and functional capacity (6MWT)       Able to understand and use rate of perceived exertion (RPE) scale  Yes       Intervention  Provide education and explanation on how to use RPE scale       Expected Outcomes  Short Term: Able to use RPE daily in rehab to express subjective intensity level;Long Term:  Able to use RPE to guide intensity level when exercising independently       Knowledge and understanding of Target Heart Rate Range (THRR)  Yes       Intervention  Provide education and explanation of THRR including how the numbers were predicted and where they are located for reference       Expected Outcomes  Short Term: Able to state/look up THRR;Short Term: Able to use daily as guideline for intensity in rehab;Long Term:  Able to use THRR to govern intensity when exercising independently       Able to check pulse independently  Yes       Intervention  Provide education and demonstration on how to check pulse in carotid and radial arteries.;Review the importance of being able to check your own pulse for safety during independent exercise       Expected Outcomes  Short Term: Able to explain why pulse checking is important during independent exercise;Long Term: Able to check pulse independently and accurately       Understanding of Exercise Prescription  Yes       Intervention  Provide education, explanation, and written materials on patient's individual exercise prescription       Expected Outcomes  Short Term: Able to explain program exercise prescription;Long Term: Able to explain home exercise prescription to exercise independently          Exercise Goals Re-Evaluation : Exercise Goals Re-Evaluation    Row Name 02/01/19 1737 02/23/19 1146           Exercise Goal Re-Evaluation   Exercise Goals Review  Increase Physical Activity;Increase Strength and Stamina;Able to understand and use rate of perceived exertion (RPE) scale;Able to understand and use Dyspnea scale  Increase Physical Activity;Increase Strength and Stamina;Able to understand and use rate of perceived exertion (RPE) scale;Knowledge and understanding of Target Heart Rate Range (THRR);Able to check pulse independently;Understanding of Exercise Prescription      Comments  Reviewed RPE scale, THR and program prescription with pt today.  Pt voiced understanding and was given a copy of goals to take home.   Reviewed home exercise with pt today.  Pt plans to use gym at apartment complex for exercise.  Reviewed THR, pulse, RPE, sign and symptoms, NTG use, and when to call 911 or MD.  Also discussed weather considerations and indoor options.  Pt voiced understanding.  HT classes ar enot meeting at this time due to [place] 19.        Expected Outcomes  Short: Use  RPE daily to regulate intensity. Long: Follow program prescription in THR.  Short - exercise on her own while missing classes Long - improve overall MET level         Discharge Exercise Prescription (Final Exercise Prescription Changes): Exercise Prescription Changes - 01/21/19 1300      Response to Exercise   Blood Pressure (Admit)  126/74    Blood Pressure (Exercise)  156/74    Blood Pressure (Exit)  130/70    Heart Rate (Admit)  103 bpm    Heart Rate (Exercise)  135 bpm    Heart Rate (Exit)  108 bpm    Oxygen Saturation (Admit)  96 %    Oxygen Saturation (Exercise)  97 %    Rating of Perceived Exertion (Exercise)  12    Perceived Dyspnea (Exercise)  1    Symptoms  legs tired and pain 9/10, SOB    Comments  walk test results       Nutrition:  Target Goals: Understanding of nutrition guidelines, daily intake of sodium <1518m, cholesterol <2077m calories 30% from fat and 7% or less from saturated fats, daily to have 5 or more servings of fruits and vegetables.  Biometrics: Pre Biometrics - 01/21/19 1407      Pre Biometrics   Height  5' 6.4" (1.687 m)    Weight  275 lb 12.8 oz (125.1 kg)    Waist Circumference  50 inches    Hip Circumference  52 inches    Waist to Hip Ratio  0.96 %    BMI (Calculated)  43.96  Single Leg Stand  4.58 seconds        Nutrition Therapy Plan and Nutrition Goals: Nutrition Therapy & Goals - 02/24/19 1241      Nutrition Therapy   Diet  Diabetes Heart Healthy Low Sodium       Nutrition Assessments: Nutrition Assessments - 01/21/19 1429      MEDFICTS Scores   Pre Score  24       Nutrition Goals Re-Evaluation:   Nutrition Goals Discharge (Final Nutrition Goals Re-Evaluation):   Psychosocial: Target Goals: Acknowledge presence or absence of significant depression and/or stress, maximize coping skills, provide positive support system. Participant is able to verbalize types and ability to use techniques and skills needed for  reducing stress and depression.   Initial Review & Psychosocial Screening: Initial Psych Review & Screening - 01/21/19 1426      Initial Review   Current issues with  Current Depression;Current Anxiety/Panic;Current Stress Concerns    Source of Stress Concerns  Chronic Illness;Unable to perform yard/household activities;Unable to participate in former interests or hobbies;Family    Comments  Caylah reports her quality of life is low due to inability to participate in some daily activities and hobbies related to her lack of stamina. She said her husband, who is a Administrator, and her 50 year old daughter are very supportive. She wants to work on her energy level while here      Day?  Yes   husband, daughter, family     Barriers   Psychosocial barriers to participate in program  There are no identifiable barriers or psychosocial needs.;The patient should benefit from training in stress management and relaxation.      Screening Interventions   Interventions  Encouraged to exercise;Program counselor consult;To provide support and resources with identified psychosocial needs;Provide feedback about the scores to participant    Expected Outcomes  Short Term goal: Utilizing psychosocial counselor, staff and physician to assist with identification of specific Stressors or current issues interfering with healing process. Setting desired goal for each stressor or current issue identified.;Long Term Goal: Stressors or current issues are controlled or eliminated.;Short Term goal: Identification and review with participant of any Quality of Life or Depression concerns found by scoring the questionnaire.;Long Term goal: The participant improves quality of Life and PHQ9 Scores as seen by post scores and/or verbalization of changes       Quality of Life Scores:  Quality of Life - 01/21/19 1428      Quality of Life   Select  Quality of Life      Quality of Life Scores    Health/Function Pre  3.2 %    Socioeconomic Pre  15.43 %    Psych/Spiritual Pre  3.43 %    Family Pre  20.4 %    GLOBAL Pre  8.23 %      Scores of 19 and below usually indicate a poorer quality of life in these areas.  A difference of  2-3 points is a clinically meaningful difference.  A difference of 2-3 points in the total score of the Quality of Life Index has been associated with significant improvement in overall quality of life, self-image, physical symptoms, and general health in studies assessing change in quality of life.  PHQ-9: Recent Review Flowsheet Data    Depression screen [facility] 2/9 01/21/2019 10/06/2018 01/09/2018 08/18/2017 08/12/2017   Decreased Interest 3 0 0 1 2   Down, Depressed, Hopeless _0 2  PHQ - 2 Score _0 Altered sleeping _1 - 3   Tired, decreased energy _2 - 2   Change in appetite _3 - 2   Feeling bad or failure about yourself  _4 - 2   Trouble concentrating _5 - 2   Moving slowly or fidgety/restless 2 0 0 - 0   Suicidal thoughts 0 0 2 0 0   PHQ-9 Score _6 - 15   Difficult doing work/chores Extremely dIfficult Very difficult - - -     Interpretation of Total Score  Total Score Depression Severity:  1-4 = Minimal depression, 5-9 = Mild depression, 10-14 = Moderate depression, 15-19 = Moderately severe depression, 20-27 = Severe depression   Psychosocial Evaluation and Intervention: Psychosocial Evaluation - 03/01/19 1043      Psychosocial Evaluation & Interventions   Comments  Chaplain attempted to reach patient to conduct initial psychosocial evaluation.  Message left requesting call back from patient.    Expected Outcomes  Counselor will continue to attempt to reach patient.    Continue Psychosocial Services   Follow up required by counselor       Psychosocial Re-Evaluation:   Psychosocial Discharge (Final Psychosocial Re-Evaluation):   Vocational Rehabilitation: Provide vocational rehab assistance to qualifying  candidates.   Vocational Rehab Evaluation & Intervention: Vocational Rehab - 01/21/19 1426      Initial Vocational Rehab Evaluation & Intervention   Assessment shows need for Vocational Rehabilitation  No       Education: Education Goals: Education classes will be provided on a variety of topics geared toward better understanding of heart health and risk factor modification. Participant will state understanding/return demonstration of topics presented as noted by education test scores.  Learning Barriers/Preferences: Learning Barriers/Preferences - 01/21/19 1425      Learning Barriers/Preferences   Learning Barriers  None    Learning Preferences  None       Education Topics:  AED/CPR: - Group verbal and written instruction with the use of models to demonstrate the basic use of the AED with the basic ABC's of resuscitation.   Cardiac Rehab from 02/01/2019 in [facility] Cardiac and Pulmonary Rehab  Date  02/01/19  Educator  CE  Instruction Review Code  1- Verbalizes Understanding      General Nutrition Guidelines/Fats and Fiber: -Group instruction provided by verbal, written material, models and posters to present the general guidelines for heart healthy nutrition. Gives an explanation and review of dietary fats and fiber.   Controlling Sodium/Reading Food Labels: -Group verbal and written material supporting the discussion of sodium use in heart healthy nutrition. Review and explanation with models, verbal and written materials for utilization of the food label.   Cardiac Rehab from 08/02/2015 in [facility] Cardiac and Pulmonary Rehab  Date  07/31/15  Educator  PI  Instruction Review Code (retired)  2- meets goals/outcomes      Exercise Physiology & General Exercise Guidelines: - Group verbal and written instruction with models to review the exercise physiology of the cardiovascular system and associated critical values. Provides general exercise guidelines with specific guidelines to  those with heart or lung disease.    Aerobic Exercise & Resistance Training: - Gives group verbal and written instruction on the various components of exercise. Focuses on aerobic and resistive training programs and the benefits of this training and how to safely progress through these programs..   Flexibility, Balance, Mind/Body Relaxation:  Provides group verbal/written instruction on the benefits of flexibility and balance training, including mind/body exercise modes such as yoga, pilates and tai chi.  Demonstration and skill practice provided.   Stress and Anxiety: - Provides group verbal and written instruction about the health risks of elevated stress and causes of high stress.  Discuss the correlation between heart/lung disease and anxiety and treatment options. Review healthy ways to manage with stress and anxiety.   Depression: - Provides group verbal and written instruction on the correlation between heart/lung disease and depressed mood, treatment options, and the stigmas associated with seeking treatment.   Cardiac Rehab from 08/02/2015 in [facility] Cardiac and Pulmonary Rehab  Date  08/02/15  Educator  [facility]  Instruction Review Code (retired)  2- meets Designer, fashion/clothing & Physiology of the Heart: - Group verbal and written instruction and models provide basic cardiac anatomy and physiology, with the coronary electrical and arterial systems. Review of Valvular disease and Heart Failure   Cardiac Procedures: - Group verbal and written instruction to review commonly prescribed medications for heart disease. Reviews the medication, class of the drug, and side effects. Includes the steps to properly store meds and maintain the prescription regimen. (beta blockers and nitrates)   Cardiac Medications I: - Group verbal and written instruction to review commonly prescribed medications for heart disease. Reviews the medication, class of the drug, and side effects. Includes the  steps to properly store meds and maintain the prescription regimen.   Cardiac Medications II: -Group verbal and written instruction to review commonly prescribed medications for heart disease. Reviews the medication, class of the drug, and side effects. (all other drug classes)    Go Sex-Intimacy & Heart Disease, Get SMART - Goal Setting: - Group verbal and written instruction through game format to discuss heart disease and the return to sexual intimacy. Provides group verbal and written material to discuss and apply goal setting through the application of the S.M.A.R.T. Method.   Other Matters of the Heart: - Provides group verbal, written materials and models to describe Stable Angina and Peripheral Artery. Includes description of the disease process and treatment options available to the cardiac patient.   Exercise & Equipment Safety: - Individual verbal instruction and demonstration of equipment use and safety with use of the equipment.   Cardiac Rehab from 02/01/2019 in [facility] Cardiac and Pulmonary Rehab  Date  01/21/19  Educator  [facility]  Instruction Review Code  1- Verbalizes Understanding      Infection Prevention: - Provides verbal and written material to individual with discussion of infection control including proper hand washing and proper equipment cleaning during exercise session.   Cardiac Rehab from 02/01/2019 in [facility] Cardiac and Pulmonary Rehab  Date  01/21/19  Educator  [facility]  Instruction Review Code  1- Verbalizes Understanding      Falls Prevention: - Provides verbal and written material to individual with discussion of falls prevention and safety.   Cardiac Rehab from 02/01/2019 in [facility] Cardiac and Pulmonary Rehab  Date  01/21/19  Educator  [facility]  Instruction Review Code  1- Verbalizes Understanding      Diabetes: - Individual verbal and written instruction to review signs/symptoms of diabetes, desired ranges of glucose level fasting, after meals and with exercise.  Acknowledge that pre and post exercise glucose checks will be done for 3 sessions at entry of program.   Cardiac Rehab from 02/01/2019 in [facility] Cardiac and Pulmonary Rehab  Date  01/21/19  Educator  [place]  Instruction Review Code  1- Verbalizes Understanding      Know Your Numbers and Risk Factors: -Group verbal and written instruction about important numbers in your health.  Discussion of what are risk factors and how they play a role in the disease process.  Review of Cholesterol, Blood Pressure, Diabetes, and BMI and the role they play in your overall health.   Sleep Hygiene: -Provides group verbal and written instruction about how sleep can affect your health.  Define sleep hygiene, discuss sleep cycles and impact of sleep habits. Review good sleep hygiene tips.    Other: -Provides group and verbal instruction on various topics (see comments)   Knowledge Questionnaire Score: Knowledge Questionnaire Score - 01/21/19 1425      Knowledge Questionnaire Score   Pre Score  24/26   correct answers reviewed with [place]. Focus on PAD and Nutrition       Core Components/Risk Factors/Patient Goals at Admission: Personal Goals and Risk Factors at Admission - 01/21/19 1424      Core Components/Risk Factors/Patient Goals on Admission    Weight Management  Yes;Obesity;Weight Loss    Intervention  Weight Management: Develop a combined nutrition and exercise program designed to reach desired caloric intake, while maintaining appropriate intake of nutrient and fiber, sodium and fats, and appropriate energy expenditure required for the weight goal.;Weight Management: Provide education and appropriate resources to help participant work on and attain dietary goals.;Weight Management/Obesity: Establish reasonable short term and long term weight goals.;Obesity: Provide education and appropriate resources to help participant work on and attain dietary goals.    Admit Weight  275 lb 12.8 oz (125.1 kg)     Goal Weight: Short Term  270 lb (122.5 kg)    Goal Weight: Long Term  140 lb (63.5 kg)    Expected Outcomes  Short Term: Continue to assess and modify interventions until short term weight is achieved;Long Term: Adherence to nutrition and physical activity/exercise program aimed toward attainment of established weight goal;Weight Loss: Understanding of general recommendations for a balanced deficit meal plan, which promotes 1-2 lb weight loss per week and includes a negative energy balance of [phone] kcal/d;Understanding recommendations for meals to include 15-35% energy as protein, 25-35% energy from fat, 35-60% energy from carbohydrates, less than 251m of dietary cholesterol, 20-35 gm of total fiber daily;Understanding of distribution of calorie intake throughout the day with the consumption of 4-5 meals/snacks    Diabetes  Yes    Intervention  Provide education about signs/symptoms and action to take for hypo/hyperglycemia.;Provide education about proper nutrition, including hydration, and aerobic/resistive exercise prescription along with prescribed medications to achieve blood glucose in normal ranges: Fasting glucose 65-99 mg/dL    Expected Outcomes  Short Term: Participant verbalizes understanding of the signs/symptoms and immediate care of hyper/hypoglycemia, proper foot care and importance of medication, aerobic/resistive exercise and nutrition plan for blood glucose control.;Long Term: Attainment of HbA1C < 7%.    Heart Failure  Yes    Intervention  Provide a combined exercise and nutrition program that is supplemented with education, support and counseling about heart failure. Directed toward relieving symptoms such as shortness of breath, decreased exercise tolerance, and extremity edema.    Expected Outcomes  Improve functional capacity of life;Short term: Attendance in program 2-3 days a week with increased exercise capacity. Reported lower sodium intake. Reported increased fruit and  vegetable intake. Reports medication compliance.;Short term: Daily weights obtained and reported for increase. Utilizing diuretic protocols set by physician.;Long term: Adoption of  self-care skills and reduction of barriers for early signs and symptoms recognition and intervention leading to self-care maintenance.    Hypertension  Yes    Intervention  Provide education on lifestyle modifcations including regular physical activity/exercise, weight management, moderate sodium restriction and increased consumption of fresh fruit, vegetables, and low fat dairy, alcohol moderation, and smoking cessation.;Monitor prescription use compliance.    Expected Outcomes  Short Term: Continued assessment and intervention until BP is < 140/24m HG in hypertensive participants. < 130/837mHG in hypertensive participants with diabetes, heart failure or chronic kidney disease.;Long Term: Maintenance of blood pressure at goal levels.    Lipids  Yes    Intervention  Provide education and support for participant on nutrition & aerobic/resistive exercise along with prescribed medications to achieve LDL <7017mHDL >16m63m  Expected Outcomes  Short Term: Participant states understanding of desired cholesterol values and is compliant with medications prescribed. Participant is following exercise prescription and nutrition guidelines.;Long Term: Cholesterol controlled with medications as prescribed, with individualized exercise RX and with personalized nutrition plan. Value goals: LDL < 70mg65mL > 40 mg.       Core Components/Risk Factors/Patient Goals Review:  Goals and Risk Factor Review    Row Name 02/23/19 1149             Core Components/Risk Factors/Patient Goals Review   Personal Goals Review  Diabetes;Hypertension;Other       Review  FerniKennitaan infusion tomorrow due to anemia.  She is taking all meds as directed and checking BG and BP at home.  They have been in normal range.  She is walking up and down  stairs at her home foe exercise.  She states she has a lot of pain but wants to move.  She will consult with Dr tomorrow about level of exercise considering infusion.       Expected Outcomes  Short - check with Dr about exercise due to infusion Long - improve exercise level          Core Components/Risk Factors/Patient Goals at Discharge (Final Review):  Goals and Risk Factor Review - 02/23/19 1149      Core Components/Risk Factors/Patient Goals Review   Personal Goals Review  Diabetes;Hypertension;Other    Review  FerniJulianian infusion tomorrow due to anemia.  She is taking all meds as directed and checking BG and BP at home.  They have been in normal range.  She is walking up and down stairs at her home foe exercise.  She states she has a lot of pain but wants to move.  She will consult with Dr tomorrow about level of exercise considering infusion.    Expected Outcomes  Short - check with Dr about exercise due to infusion Long - improve exercise level       ITP Comments: ITP Comments    Row Name 02/03/19 0620 02/26/19 0809 03/03/19 1304 03/03/19 1308 06/23/19 1116   ITP Comments  30 day review. Continue with ITP unless directed changes by Medical Director chart review. New to program  Our program is currently closed due to COVID-19.  We are communicating with patient via phone calls and emails.   30 day review. Continue with ITP unless directed changes by Medical Director chart review.   30 day review. Continue with ITP unless directed changes by Medical Director chart review.   30 day review cycle restarting  after being closed since March 16 because of  Covid 19 pandemic. Program opened  to patients on July 6. Not all have returned. ITP updated and sent to Medical Director for review,changes as needed and signature   Winnie Name 06/23/19 1122 07/09/19 0952         ITP Comments  30 day review cycle restarting after being closed since March 16 because of Covid 19 pandemic. Program opened to  patients on July 6. Not all have returned. ITP updated and sent to Medical Director for review,changes as needed and signature  No response to several calls since we have been closed and reopening.  WIll discharge today.         Comments: Discharged  No response to calls and letters

## 2019-07-13 ENCOUNTER — Telehealth: Payer: Self-pay | Admitting: *Deleted

## 2019-07-13 NOTE — Telephone Encounter (Signed)
Left message. Received letter that [facility] has been unable to contact the patient to schedule her an appointment. The referral will be left open for 6 months according to the letter.

## 2019-07-14 NOTE — Telephone Encounter (Signed)
Attempted to contact pt, no response.

## 2019-07-22 ENCOUNTER — Inpatient Hospital Stay
Admission: EM | Admit: 2019-07-22 | Discharge: 2019-07-28 | DRG: 074 | Disposition: A | Discharge status: Home / Self Care | Payer: Medicare Other | Attending: Internal Medicine | Admitting: Internal Medicine

## 2019-07-22 ENCOUNTER — Other Ambulatory Visit: Payer: Self-pay

## 2019-07-22 DIAGNOSIS — I2510 Atherosclerotic heart disease of native coronary artery without angina pectoris: Secondary | ICD-10-CM | POA: Diagnosis present

## 2019-07-22 DIAGNOSIS — Z20828 Contact with and (suspected) exposure to other viral communicable diseases: Secondary | ICD-10-CM | POA: Diagnosis present

## 2019-07-22 DIAGNOSIS — G8929 Other chronic pain: Secondary | ICD-10-CM | POA: Diagnosis present

## 2019-07-22 DIAGNOSIS — I252 Old myocardial infarction: Secondary | ICD-10-CM

## 2019-07-22 DIAGNOSIS — K5730 Diverticulosis of large intestine without perforation or abscess without bleeding: Secondary | ICD-10-CM | POA: Diagnosis present

## 2019-07-22 DIAGNOSIS — E860 Dehydration: Secondary | ICD-10-CM | POA: Diagnosis present

## 2019-07-22 DIAGNOSIS — Z91018 Allergy to other foods: Secondary | ICD-10-CM

## 2019-07-22 DIAGNOSIS — Z7902 Long term (current) use of antithrombotics/antiplatelets: Secondary | ICD-10-CM

## 2019-07-22 DIAGNOSIS — E1143 Type 2 diabetes mellitus with diabetic autonomic (poly)neuropathy: Secondary | ICD-10-CM | POA: Diagnosis not present

## 2019-07-22 DIAGNOSIS — Z79899 Other long term (current) drug therapy: Secondary | ICD-10-CM

## 2019-07-22 DIAGNOSIS — R112 Nausea with vomiting, unspecified: Secondary | ICD-10-CM

## 2019-07-22 DIAGNOSIS — I255 Ischemic cardiomyopathy: Secondary | ICD-10-CM | POA: Diagnosis present

## 2019-07-22 DIAGNOSIS — Z87891 Personal history of nicotine dependence: Secondary | ICD-10-CM

## 2019-07-22 DIAGNOSIS — E1122 Type 2 diabetes mellitus with diabetic chronic kidney disease: Secondary | ICD-10-CM | POA: Diagnosis present

## 2019-07-22 DIAGNOSIS — E785 Hyperlipidemia, unspecified: Secondary | ICD-10-CM | POA: Diagnosis present

## 2019-07-22 DIAGNOSIS — Z03818 Encounter for observation for suspected exposure to other biological agents ruled out: Secondary | ICD-10-CM | POA: Diagnosis not present

## 2019-07-22 DIAGNOSIS — Z801 Family history of malignant neoplasm of trachea, bronchus and lung: Secondary | ICD-10-CM

## 2019-07-22 DIAGNOSIS — I5032 Chronic diastolic (congestive) heart failure: Secondary | ICD-10-CM | POA: Diagnosis present

## 2019-07-22 DIAGNOSIS — R000 Tachycardia, unspecified: Secondary | ICD-10-CM | POA: Diagnosis not present

## 2019-07-22 DIAGNOSIS — K219 Gastro-esophageal reflux disease without esophagitis: Secondary | ICD-10-CM | POA: Diagnosis present

## 2019-07-22 DIAGNOSIS — K3184 Gastroparesis: Secondary | ICD-10-CM | POA: Diagnosis not present

## 2019-07-22 DIAGNOSIS — R109 Unspecified abdominal pain: Secondary | ICD-10-CM | POA: Diagnosis not present

## 2019-07-22 DIAGNOSIS — F1290 Cannabis use, unspecified, uncomplicated: Secondary | ICD-10-CM | POA: Diagnosis present

## 2019-07-22 DIAGNOSIS — Z6841 Body Mass Index (BMI) 40.0 and over, adult: Secondary | ICD-10-CM | POA: Diagnosis not present

## 2019-07-22 DIAGNOSIS — Z91013 Allergy to seafood: Secondary | ICD-10-CM | POA: Diagnosis not present

## 2019-07-22 DIAGNOSIS — F329 Major depressive disorder, single episode, unspecified: Secondary | ICD-10-CM | POA: Diagnosis present

## 2019-07-22 DIAGNOSIS — I10 Essential (primary) hypertension: Secondary | ICD-10-CM | POA: Diagnosis not present

## 2019-07-22 DIAGNOSIS — N179 Acute kidney failure, unspecified: Secondary | ICD-10-CM | POA: Diagnosis present

## 2019-07-22 DIAGNOSIS — Z833 Family history of diabetes mellitus: Secondary | ICD-10-CM

## 2019-07-22 DIAGNOSIS — N183 Chronic kidney disease, stage 3 (moderate): Secondary | ICD-10-CM | POA: Diagnosis present

## 2019-07-22 DIAGNOSIS — I130 Hypertensive heart and chronic kidney disease with heart failure and stage 1 through stage 4 chronic kidney disease, or unspecified chronic kidney disease: Secondary | ICD-10-CM | POA: Diagnosis present

## 2019-07-22 DIAGNOSIS — Z888 Allergy status to other drugs, medicaments and biological substances status: Secondary | ICD-10-CM | POA: Diagnosis not present

## 2019-07-22 DIAGNOSIS — Z808 Family history of malignant neoplasm of other organs or systems: Secondary | ICD-10-CM

## 2019-07-22 DIAGNOSIS — E119 Type 2 diabetes mellitus without complications: Secondary | ICD-10-CM | POA: Diagnosis not present

## 2019-07-22 DIAGNOSIS — Z794 Long term (current) use of insulin: Secondary | ICD-10-CM

## 2019-07-22 DIAGNOSIS — Z8042 Family history of malignant neoplasm of prostate: Secondary | ICD-10-CM

## 2019-07-22 DIAGNOSIS — Z7982 Long term (current) use of aspirin: Secondary | ICD-10-CM

## 2019-07-22 LAB — CBC
HCT: 38.7 % (ref 36.0–46.0)
Hemoglobin: 12.3 g/dL (ref 12.0–15.0)
MCH: 26.2 pg (ref 26.0–34.0)
MCHC: 31.8 g/dL (ref 30.0–36.0)
MCV: 82.5 fL (ref 80.0–100.0)
Platelets: 496 10*3/uL — ABNORMAL HIGH (ref 150–400)
RBC: 4.69 MIL/uL (ref 3.87–5.11)
RDW: 14.2 % (ref 11.5–15.5)
WBC: 8.3 10*3/uL (ref 4.0–10.5)
nRBC: 0 % (ref 0.0–0.2)

## 2019-07-22 LAB — COMPREHENSIVE METABOLIC PANEL
ALT: 21 U/L (ref 0–44)
AST: 19 U/L (ref 15–41)
Albumin: 3.3 g/dL — ABNORMAL LOW (ref 3.5–5.0)
Alkaline Phosphatase: 86 U/L (ref 38–126)
Anion gap: 13 (ref 5–15)
BUN: 27 mg/dL — ABNORMAL HIGH (ref 6–20)
CO2: 19 mmol/L — ABNORMAL LOW (ref 22–32)
Calcium: 9 mg/dL (ref 8.9–10.3)
Chloride: 104 mmol/L (ref 98–111)
Creatinine, Ser: 2.23 mg/dL — ABNORMAL HIGH (ref 0.44–1.00)
GFR calc Af Amer: 31 mL/min — ABNORMAL LOW (ref >60)
GFR calc non Af Amer: 27 mL/min — ABNORMAL LOW (ref >60)
Glucose, Bld: 394 mg/dL — ABNORMAL HIGH (ref 70–99)
Potassium: 4.4 mmol/L (ref 3.5–5.1)
Sodium: 136 mmol/L (ref 135–145)
Total Bilirubin: 0.6 mg/dL (ref 0.3–1.2)
Total Protein: 7.5 g/dL (ref 6.5–8.1)

## 2019-07-22 LAB — LIPASE, BLOOD: Lipase: 20 U/L (ref 11–51)

## 2019-07-22 MED ORDER — SODIUM CHLORIDE 0.9 % IV SOLN
INTRAVENOUS | Status: DC
  Administered 2019-07-23 – 2019-07-28 (×9): via INTRAVENOUS

## 2019-07-22 MED ORDER — INSULIN ASPART 100 UNIT/ML ~~LOC~~ SOLN
0.0000 [IU] | Freq: Every day | SUBCUTANEOUS | Status: DC
  Filled 2019-07-22: qty 1

## 2019-07-22 MED ORDER — IOHEXOL 240 MG/ML SOLN
25.0000 mL | INTRAMUSCULAR | Status: AC
  Administered 2019-07-22: 25 mL via ORAL

## 2019-07-22 MED ORDER — ASPIRIN EC 81 MG PO TBEC
81.0000 mg | DELAYED_RELEASE_TABLET | Freq: Every day | ORAL | Status: DC
  Administered 2019-07-23 – 2019-07-28 (×6): 81 mg via ORAL
  Filled 2019-07-22 (×6): qty 1

## 2019-07-22 MED ORDER — AMITRIPTYLINE HCL 25 MG PO TABS
25.0000 mg | ORAL_TABLET | Freq: Every day | ORAL | Status: DC
  Administered 2019-07-23 – 2019-07-27 (×6): 25 mg via ORAL
  Filled 2019-07-22 (×7): qty 1

## 2019-07-22 MED ORDER — MIRTAZAPINE 15 MG PO TABS: 60.0000 mg | ORAL_TABLET | Freq: Every day | ORAL | Status: DC

## 2019-07-22 MED ORDER — GLIMEPIRIDE 4 MG PO TABS: 4.0000 mg | ORAL_TABLET | Freq: Every day | ORAL | Status: DC

## 2019-07-22 MED ORDER — PANTOPRAZOLE SODIUM 40 MG PO TBEC
40.0000 mg | DELAYED_RELEASE_TABLET | Freq: Two times a day (BID) | ORAL | Status: DC
  Administered 2019-07-23 – 2019-07-28 (×12): 40 mg via ORAL
  Filled 2019-07-22 (×12): qty 1

## 2019-07-22 MED ORDER — DULOXETINE HCL 60 MG PO CPEP
60.0000 mg | ORAL_CAPSULE | Freq: Every day | ORAL | Status: DC
  Administered 2019-07-23 – 2019-07-28 (×6): 60 mg via ORAL
  Filled 2019-07-22: qty 2
  Filled 2019-07-22: qty 1
  Filled 2019-07-22: qty 2
  Filled 2019-07-22 (×2): qty 1
  Filled 2019-07-22: qty 2
  Filled 2019-07-22 (×2): qty 1
  Filled 2019-07-22 (×2): qty 2
  Filled 2019-07-22: qty 1

## 2019-07-22 MED ORDER — MORPHINE SULFATE (PF) 4 MG/ML IV SOLN
4.0000 mg | Freq: Once | INTRAVENOUS | Status: AC
  Administered 2019-07-22: 4 mg via INTRAVENOUS
  Filled 2019-07-22: qty 1

## 2019-07-22 MED ORDER — CANAGLIFLOZIN 100 MG PO TABS: 100.0000 mg | ORAL_TABLET | Freq: Every day | ORAL | Status: DC

## 2019-07-22 MED ORDER — METOPROLOL TARTRATE 50 MG PO TABS
100.0000 mg | ORAL_TABLET | Freq: Once | ORAL | Status: AC
  Administered 2019-07-22: 100 mg via ORAL
  Filled 2019-07-22: qty 2

## 2019-07-22 MED ORDER — INSULIN ASPART 100 UNIT/ML ~~LOC~~ SOLN: 0.0000 [IU] | Freq: Three times a day (TID) | SUBCUTANEOUS | Status: DC

## 2019-07-22 MED ORDER — ONDANSETRON HCL 4 MG/2ML IJ SOLN
4.0000 mg | Freq: Four times a day (QID) | INTRAMUSCULAR | Status: DC | PRN
  Administered 2019-07-23 – 2019-07-27 (×4): 4 mg via INTRAVENOUS
  Filled 2019-07-22 (×4): qty 2

## 2019-07-22 MED ORDER — CLOPIDOGREL BISULFATE 75 MG PO TABS
75.0000 mg | ORAL_TABLET | Freq: Every day | ORAL | Status: DC
  Administered 2019-07-23 – 2019-07-28 (×6): 75 mg via ORAL
  Filled 2019-07-22 (×6): qty 1

## 2019-07-22 MED ORDER — IRBESARTAN 75 MG PO TABS: 75.0000 mg | ORAL_TABLET | Freq: Every day | ORAL | Status: DC

## 2019-07-22 MED ORDER — METOCLOPRAMIDE HCL 5 MG PO TABS
10.0000 mg | ORAL_TABLET | Freq: Three times a day (TID) | ORAL | Status: DC
  Filled 2019-07-22: qty 2

## 2019-07-22 MED ORDER — ONDANSETRON HCL 4 MG PO TABS
4.0000 mg | ORAL_TABLET | Freq: Four times a day (QID) | ORAL | Status: DC | PRN
  Administered 2019-07-23 – 2019-07-25 (×3): 4 mg via ORAL
  Filled 2019-07-22 (×3): qty 1

## 2019-07-22 MED ORDER — METOPROLOL TARTRATE 50 MG PO TABS
100.0000 mg | ORAL_TABLET | Freq: Two times a day (BID) | ORAL | Status: DC
  Administered 2019-07-23 – 2019-07-28 (×12): 100 mg via ORAL
  Filled 2019-07-22 (×12): qty 2

## 2019-07-22 MED ORDER — SODIUM CHLORIDE 0.9% FLUSH
3.0000 mL | Freq: Once | INTRAVENOUS | Status: AC
  Administered 2019-07-22: 3 mL via INTRAVENOUS

## 2019-07-22 MED ORDER — PROMETHAZINE HCL 25 MG/ML IJ SOLN
12.5000 mg | Freq: Once | INTRAMUSCULAR | Status: AC
  Administered 2019-07-22: 12.5 mg via INTRAVENOUS
  Filled 2019-07-22: qty 1

## 2019-07-22 MED ORDER — ISOSORBIDE MONONITRATE ER 30 MG PO TB24
60.0000 mg | ORAL_TABLET | Freq: Every day | ORAL | Status: DC
  Administered 2019-07-23 – 2019-07-28 (×7): 60 mg via ORAL
  Filled 2019-07-22 (×7): qty 2

## 2019-07-22 MED ORDER — ONDANSETRON HCL 4 MG/2ML IJ SOLN
INTRAMUSCULAR | Status: AC
  Filled 2019-07-22: qty 2

## 2019-07-22 MED ORDER — SODIUM CHLORIDE 0.9 % IV BOLUS
1000.0000 mL | Freq: Once | INTRAVENOUS | Status: AC
  Administered 2019-07-22: 1000 mL via INTRAVENOUS

## 2019-07-22 MED ORDER — INSULIN ASPART 100 UNIT/ML ~~LOC~~ SOLN
10.0000 [IU] | Freq: Three times a day (TID) | SUBCUTANEOUS | Status: DC
  Administered 2019-07-23 – 2019-07-27 (×11): 10 [IU] via SUBCUTANEOUS
  Filled 2019-07-22 (×11): qty 1

## 2019-07-22 MED ORDER — ENOXAPARIN SODIUM 40 MG/0.4ML ~~LOC~~ SOLN: 30.0000 mg | SUBCUTANEOUS | Status: DC

## 2019-07-22 MED ORDER — ONDANSETRON HCL 4 MG/2ML IJ SOLN
4.0000 mg | Freq: Once | INTRAMUSCULAR | Status: AC
  Administered 2019-07-22: 4 mg via INTRAVENOUS

## 2019-07-22 MED ORDER — SODIUM CHLORIDE 0.9% FLUSH
3.0000 mL | Freq: Two times a day (BID) | INTRAVENOUS | Status: DC
  Administered 2019-07-23: 3 mL via INTRAVENOUS

## 2019-07-22 NOTE — ED Notes (Signed)
Pt vomiting in triage. Attempted IV twice. Lorriane Shire RN to attempt

## 2019-07-22 NOTE — ED Triage Notes (Addendum)
Pt comes via POV from home with c/o mid upper abdominal pain. Pt states this started yesterday and has gotten worse. Pt denies any CP or SOB. Pt does state dizziness, nausea, vomiting and some diarrhea.  Pt also states fever and chills. Per mom pt has not been able to keep anything down.  Pt appears very uncomfortable.

## 2019-07-22 NOTE — H&P (Signed)
[place] [facility] [place] NAME: Chablis Losh    MR#:  [id number]  DATE OF BIRTH:  [birth year]  DATE OF ADMISSION:  07/22/2019  PRIMARY CARE PHYSICIAN: Venita Lick, NP   REQUESTING/REFERRING PHYSICIAN: Derrell Lolling, MD  CHIEF COMPLAINT:   Chief Complaint  Patient presents with   Abdominal Pain   Nausea   Emesis    HISTORY OF PRESENT ILLNESS:  [name] [name]  is a 38 y.o. female with a known history of CAD, CKD, diabetes mellitus, diabetic gastroparesis, hypertension, ischemic cardiomyopathy with ejection fraction 30 to 35% on echocardiogram April 2016 and 60 to 65% on December 2019.  She presented to the emergency room complaining of upper abdominal/epigastric pain with persistent nausea and vomiting over the last 2 days unable to keep even water down.  She has a history of gastroparesis and reports this is consistent with prior episodes.  Patient describes epigastric pain as intermittent and sharp aching pain which is nonradiating.  Pain does become worse with nausea and vomiting episodes.  She denies chest pain or increase shortness of breath.  She denies fevers or chills.  She denies known sick contacts.  Her last bowel movement was 2 days ago.  She reportedly has continued to pass flatus.  However bowel sounds are hypoactive on my evaluation.  She denies dysuria, dark urine, or urine frequency.  She denies urine urgency.  She denies foul urine odor.  She has been admitted to the hospital service for further management.  PAST MEDICAL HISTORY:   Past Medical History:  Diagnosis Date   CAD (coronary artery disease)    a. 03/2015 NSTEMI/PCI: LCX 139m (DES), OM1 100p (DES - vessel labeled Ramus in subsequent cath report). Minor irregs to LAD/RCA; b. 04/2015 Cath: LM nl, LAD 40p, RI patent stent, LCX patent stent, RCA nl, EF 55-65%; c. 02/2016 MV: basal inferolateral and mid inferolateral defect/infarct w/o ischemia. EF 55-65%; d. 11/2018 PCI:  LM 30ost, LAD 30p, LCX 95p/m (PTCA->20%), OM1 10ost, OM2 40ost, RCA 85p.   Chronic abdominal pain    Chronic diastolic (congestive) heart failure ([place])    a. 03/2015 Echo: EF 30-35%, mild concentric LVH, severe HK of inf, inflat, & lat walls, mod MR, mild TR;  b. 04/2015 LV Gram: EF 55-65%; c. 09/2017 Echo: EF 55-60%, no rwma. Mild MR; d. 11/2018 Echo: Ef 60-65%, no rwma.   CKD (chronic kidney disease), stage III ([place])    Diabetes type 2, controlled ([place])    a. since 2002    Diabetic gastroparesis (H[facility])    a. chronic nausea/vomiting.   Diverticulosis, sigmoid 07/2016   Heavy menses    a. H/O IUD - expired in 2014 - remains in place.   Hyperlipidemia    Intolerant of atorvastatin, rosuvastatin   Hypertension    Iron deficiency anemia    Ischemic cardiomyopathy (resolved)    a. 03/2015 EF 30-35% post NSTEMI;  b. 04/2015 EF 55-65% on LV gram; c. 09/2017 Echo: EF 55-60%; d. 11/2018 Echo: Ef 60-65%.   Obesity    Tobacco abuse    a. quit 03/2015.   Uterine fibroid    Vertigo    Vitamin D deficiency     PAST SURGICAL HISTORY:   Past Surgical History:  Procedure Laterality Date   APPENDECTOMY     CARDIAC CATHETERIZATION  4/16   x2 stent [place] N/A 05/05/2015   Procedure: Left Heart Cath and Coronary Angiography;  Surgeon: [name]  M Martinique, MD;  Location: [place] CV LAB;  Service: Cardiovascular;  Laterality: N/A;   COLONOSCOPY WITH PROPOFOL N/A 07/31/2016   Procedure: COLONOSCOPY WITH PROPOFOL;  Surgeon: Lollie Sails, MD;  Location: [facility] ENDOSCOPY;  Service: Endoscopy;  Laterality: N/A;   CORONARY BALLOON ANGIOPLASTY N/A 12/04/2018   Procedure: CORONARY BALLOON ANGIOPLASTY;  Surgeon: Wellington Hampshire, MD;  Location: [place] CV LAB;  Service: Cardiovascular;  Laterality: N/A;   CORONARY STENT INTERVENTION N/A 01/12/2019   Procedure: CORONARY STENT INTERVENTION;  Surgeon: [name], MD;  Location: [place] CV LAB;  Service:  Cardiovascular;  Laterality: N/A;   ESOPHAGOGASTRODUODENOSCOPY (EGD) WITH PROPOFOL N/A 07/31/2016   Procedure: ESOPHAGOGASTRODUODENOSCOPY (EGD) WITH PROPOFOL;  Surgeon: Lollie Sails, MD;  Location[facility] ENDOSCOPY;  Service: Endoscopy;  Laterality: N/A;   INTRAVASCULAR PRESSURE WIRE/FFR STUDY N/A 12/04/2018   Procedure: INTRAVASCULAR PRESSURE WIRE/FFR STUDY;  Surgeon: Wellington Hampshire, MD;  Location: [place] CV LAB;  Service: Cardiovascular;  Laterality: N/A;   LAPAROSCOPIC APPENDECTOMY N/A 08/07/2016   Procedure: APPENDECTOMY LAPAROSCOPIC;  Surgeon: Hubbard Robinson, MD;  Location: [facility] ORS;  Service: General;  Laterality: N/A;   LEFT HEART CATH AND CORONARY ANGIOGRAPHY Left 12/04/2018   Procedure: LEFT HEART CATH AND CORONARY ANGIOGRAPHY;  Surgeon: Wellington Hampshire, MD;  Location: [place] CV LAB;  Service: Cardiovascular;  Laterality: Left;   LEFT HEART CATH AND CORONARY ANGIOGRAPHY N/A 01/12/2019   Procedure: LEFT HEART CATH AND CORONARY ANGIOGRAPHY;  Surgeon: [name], MD;  Location: [place] CV LAB;  Service: Cardiovascular;  Laterality: N/A;   MOUTH SURGERY      SOCIAL HISTORY:   Social History   Tobacco Use   Smoking status: Former Smoker    Years: 15.00    Quit date: 03/10/2015    Years since quitting: 4.3   Smokeless tobacco: Never Used  Substance Use Topics   Alcohol use: No    Alcohol/week: 0.0 standard drinks    FAMILY HISTORY:   Family History  Problem Relation Age of Onset   Diabetes Father    Cancer Maternal Grandmother        lung   Cancer Maternal Grandfather        prostate   Diabetes Paternal Grandfather    Diabetes Paternal Grandmother    Cancer - Cervical Maternal Aunt     DRUG ALLERGIES:   Allergies  Allergen Reactions   Lisinopril Anaphylaxis, Itching and Swelling   Rosemary Oil Anaphylaxis   Shellfish Allergy Itching and Swelling   Other Itching and Swelling    [place] seasoning   Metformin And  Related Diarrhea, Nausea And Vomiting and Rash    REVIEW OF SYSTEMS:   Review of Systems  Constitutional: Positive for malaise/fatigue. Negative for chills and fever.  HENT: Negative for congestion, sinus pain and sore throat.   Eyes: Negative for blurred vision.  Respiratory: Negative for cough, sputum production, shortness of breath and wheezing.   Cardiovascular: Negative for chest pain, palpitations and leg swelling.  Gastrointestinal: Positive for abdominal pain, nausea and vomiting. Negative for constipation and diarrhea.  Genitourinary: Negative for dysuria, flank pain and hematuria.  Musculoskeletal: Negative for falls and myalgias.  Skin: Negative for itching and rash.  Neurological: Negative for dizziness, focal weakness and headaches.  Psychiatric/Behavioral: Negative for depression.      MEDICATIONS AT HOME:   Prior to Admission medications   Medication Sig Start Date End Date Taking? Authorizing Provider  amitriptyline (ELAVIL) 25 MG tablet Take 25  mg by mouth at bedtime. 04/18/19  Yes [provider]  aspirin EC 81 MG tablet Take 81 mg by mouth daily.   Yes [provider]  Cholecalciferol 2000 units TABS Take 2,000 Units by mouth daily.    Yes [provider]  clopidogrel (PLAVIX) 75 MG tablet Take 1 tablet (75 mg total) by mouth daily. 06/22/19 07/22/19 Yes Pyreddy, Reatha Harps, MD  DULoxetine (CYMBALTA) 30 MG capsule Take 60 mg by mouth daily.  07/01/18  Yes [provider]  FARXIGA 10 MG TABS tablet Take 10 mg by mouth daily.  09/08/18  Yes [provider]  glimepiride (AMARYL) 4 MG tablet Take 1 tablet (4 mg total) by mouth daily. 07/08/19  Yes Cannady, Jolene T, NP  insulin aspart (NOVOLOG) 100 UNIT/ML injection Inject 10 Units into the skin 3 (three) times daily with meals. 05/04/18  Yes Wieting, Richard, MD  irbesartan (AVAPRO) 75 MG tablet Take 1 tablet (75 mg total) by mouth daily. 04/29/19  Yes Wellington Hampshire, MD  isosorbide  mononitrate (IMDUR) 30 MG 24 hr tablet Take 2 tablets (60 mg total) by mouth daily. 12/17/18  Yes Dunn, Areta Haber, PA-C  metoprolol tartrate (LOPRESSOR) 100 MG tablet Take 1 tablet (100 mg total) by mouth 2 (two) times daily. 04/29/19  Yes Wellington Hampshire, MD  mirtazapine (REMERON) 30 MG tablet Take 60 mg by mouth daily.  08/12/18  Yes [provider]  pantoprazole (PROTONIX) 40 MG tablet Take 1 tablet (40 mg total) by mouth 2 (two) times daily. 06/22/19 07/22/19 Yes Pyreddy, Reatha Harps, MD  etonogestrel (NEXPLANON) 68 MG IMPL implant 1 each by Subdermal route once.    [provider]  furosemide (LASIX) 40 MG tablet Take 1 tablet (40 mg total) by mouth every other day. 01/19/19 07/18/19  End, Harrell Gave, MD  loperamide (IMODIUM A-D) 2 MG tablet Take 8-12 mg by mouth daily as needed for diarrhea or loose stools.    [provider]  meclizine (ANTIVERT) 25 MG tablet Take 25 mg by mouth 3 (three) times daily as needed for dizziness.    [provider]  metoCLOPramide (REGLAN) 10 MG tablet Take 1 tablet (10 mg total) by mouth 3 (three) times daily with meals for 10 days. 04/17/19 06/18/19  Menshew, Dannielle Karvonen, PA-C  nitroGLYCERIN (NITROSTAT) 0.4 MG SL tablet Take 1 tab under your tongue while sitting for chest pain, If no relief may repeat, one tab every 5 min up to 3 tabs total over 15 mins. 06/09/18   Theora Gianotti, NP  ondansetron (ZOFRAN) 4 MG tablet Take 1 tablet (4 mg total) by mouth every 8 (eight) hours as needed for nausea or vomiting. Patient not taking: Reported on 07/22/2019 04/16/19   Schuyler Amor, MD  polyethylene glycol (MIRALAX / GLYCOLAX) 17 g packet Take 17 g by mouth daily as needed.  04/18/19   [provider]  prochlorperazine (COMPAZINE) 5 MG tablet Take 1 tablet (5 mg total) by mouth every 6 (six) hours as needed for nausea or vomiting. Patient not taking: Reported on 07/22/2019 06/30/19   Jules Husbands, MD  traMADol (ULTRAM) 50 MG tablet  Take 1 tablet (50 mg total) by mouth every 6 (six) hours as needed. Patient not taking: Reported on 07/22/2019 06/25/19 06/24/20  Lavonia Drafts, MD      VITAL SIGNS:  Blood pressure (!) 200/99, pulse (!) 116, temperature 98 F (36.7 C), resp. rate 18, height 5\' 4"  (1.626 m), weight 117.9 kg, SpO2  98 %.  PHYSICAL EXAMINATION:  Physical Exam  GENERAL:  38 y.o.-year-old patient lying in the bed with no acute distress.  EYES: Pupils equal, round, reactive to light and accommodation. No scleral icterus. Extraocular muscles intact.  HEENT: Head atraumatic, normocephalic. Oropharynx and nasopharynx clear.  NECK:  Supple, no jugular venous distention. No thyroid enlargement, no tenderness.  LUNGS: Normal breath sounds bilaterally, no wheezing, rales,rhonchi or crepitation. No use of accessory muscles of respiration.  CARDIOVASCULAR: Regular rate and rhythm, S1, S2 normal. No murmurs, rubs, or gallops.  ABDOMEN: Soft, nondistended, with epigastric tenderness. Bowel sounds hypoactive. No organomegaly or mass.  EXTREMITIES: No pedal edema, cyanosis, or clubbing.  NEUROLOGIC: Cranial nerves II through XII are intact. Muscle strength 5/5 in all extremities. Sensation intact. Gait not checked.  PSYCHIATRIC: The patient is alert and oriented x 3.  Normal affect and good eye contact. SKIN: No obvious rash, lesion, or ulcer.   LABORATORY PANEL:   CBC Recent Labs  Lab 07/22/19 2054  WBC 8.3  HGB 12.3  HCT 38.7  PLT 496*   ------------------------------------------------------------------------------------------------------------------  Chemistries  Recent Labs  Lab 07/22/19 2054  NA 136  K 4.4  CL 104  CO2 19*  GLUCOSE 394*  BUN 27*  CREATININE 2.23*  CALCIUM 9.0  AST 19  ALT 21  ALKPHOS 86  BILITOT 0.6   ------------------------------------------------------------------------------------------------------------------  Cardiac Enzymes No results for input(s): TROPONINI in the  last 168 hours. ------------------------------------------------------------------------------------------------------------------  RADIOLOGY:  No results found.    IMPRESSION AND PLAN:   1.  Dehydration - Patient received IV fluid bolus in the emergency room currently with normal saline infusing to peripheral IV 100 cc/h.  We are treating persistent nausea and vomiting with IV antiemetic - Abdominal CT results are pending - Will repeat CBC and BMP in the a.m.  2.  Acute kidney injury - Secondary to #1 - Patient receiving IV hydration - Will repeat BMP in the a.m. and continue to monitor renal function closely -Patient has a history of CKD with prior creatinine of 1.4.  3.  Hypertension - Continue metoprolol - We will treat persistent hypertension with IV hydralazine - Persistent hypertension is likely related to uncontrolled pain with persistent nausea and vomiting and patient having been unable to tolerate p.o. medications over the last 2 days.  4.  Diabetes mellitus - Hemoglobin A1c pending - Moderate sliding scale insulin  DVT and PPI prophylaxis initiated    All the records are reviewed and case discussed with ED provider. The plan of care was discussed in details with the patient (and family). I answered all questions. The patient agreed to proceed with the above mentioned plan. Further management will depend upon hospital course.   CODE STATUS: Full code  TOTAL TIME TAKING CARE OF THIS PATIENT: 37minutes.    [place] on 07/22/2019 at 11:01 PM  Pager - [phone]  After 6pm go to [email or web address] - Proofreader  [facility] [place] [facility]  Office  [phone]  CC: Primary care physician; Venita Lick, NP   Note: This dictation was prepared with Dragon dictation along with smaller phrase technology. Any transcriptional errors that result from this process are unintentional.

## 2019-07-22 NOTE — ED Notes (Signed)
ED TO INPATIENT HANDOFF REPORT  ED Nurse Name and Phone #: Lattie Haw [phone]  S Name/Age/Gender [name] [name] 38 y.o. female Room/Bed: ED02A/ED02A  Code Status   Code Status: Full Code  Home/SNF/Other Home  Patient oriented to: self, place, time and situation Is this baseline? Yes   Triage Complete: Triage complete  Chief Complaint Abd pain/N/V  Triage Note Pt comes via POV from home with c/o mid upper abdominal pain. Pt states this started yesterday and has gotten worse. Pt denies any CP or SOB. Pt does state dizziness, nausea, vomiting and some diarrhea.  Pt also states fever and chills. Per mom pt has not been able to keep anything down.  Pt appears very uncomfortable.   Allergies Allergies  Allergen Reactions  . Lisinopril Anaphylaxis, Itching and Swelling  . Rosemary Oil Anaphylaxis  . Shellfish Allergy Itching and Swelling  . Other Itching and Swelling    [place] seasoning  . Metformin And Related Diarrhea, Nausea And Vomiting and Rash    Level of Care/Admitting Diagnosis ED Disposition    ED Disposition Condition [facility] [place] [[id number]]  Level of Care: Med-Surg [16]  Covid Evaluation: Asymptomatic Screening Protocol (No Symptoms)  Diagnosis: AKI (acute kidney injury) [facility]) [[id number]]  Admitting Physician: Mayer Camel [[id number]]  Attending Physician: Mayer Camel [[id number]]  Estimated length of stay: past midnight tomorrow  Certification:: I certify this patient will need inpatient services for at least 2 midnights  PT Class (Do Not Modify): Inpatient [101]  PT Acc Code (Do Not Modify): Private [1]       B Medical/Surgery History Past Medical History:  Diagnosis Date  . CAD (coronary artery disease)    a. 03/2015 NSTEMI/PCI: LCX 160m (DES), OM1 100p (DES - vessel labeled Ramus in subsequent cath report). Minor irregs to LAD/RCA; b. 04/2015 Cath: LM nl, LAD 40p, RI patent stent, LCX patent stent, RCA nl,  EF 55-65%; c. 02/2016 MV: basal inferolateral and mid inferolateral defect/infarct w/o ischemia. EF 55-65%; d. 11/2018 PCI: LM 30ost, LAD 30p, LCX 95p/m (PTCA->20%), OM1 10ost, OM2 40ost, RCA 85p.  Marland Kitchen Chronic abdominal pain   . Chronic diastolic (congestive) heart failure ([place])    a. 03/2015 Echo: EF 30-35%, mild concentric LVH, severe HK of inf, inflat, & lat walls, mod MR, mild TR;  b. 04/2015 LV Gram: EF 55-65%; c. 09/2017 Echo: EF 55-60%, no rwma. Mild MR; d. 11/2018 Echo: Ef 60-65%, no rwma.  . CKD (chronic kidney disease), stage III ([place])   . Diabetes type 2, controlled ([place])    a. since 2002   . Diabetic gastroparesis (H[facility])    a. chronic nausea/vomiting.  . Diverticulosis, sigmoid 07/2016  . Heavy menses    a. H/O IUD - expired in 2014 - remains in place.  . Hyperlipidemia    Intolerant of atorvastatin, rosuvastatin  . Hypertension   . Iron deficiency anemia   . Ischemic cardiomyopathy (resolved)    a. 03/2015 EF 30-35% post NSTEMI;  b. 04/2015 EF 55-65% on LV gram; c. 09/2017 Echo: EF 55-60%; d. 11/2018 Echo: Ef 60-65%.  . Obesity   . Tobacco abuse    a. quit 03/2015.  Marland Kitchen Uterine fibroid   . Vertigo   . Vitamin D deficiency    Past Surgical History:  Procedure Laterality Date  . APPENDECTOMY    . CARDIAC CATHETERIZATION  4/16   x2 stent [facility]  . CARDIAC CATHETERIZATION N/A 05/05/2015   Procedure: Left  Heart Cath and Coronary Angiography;  Surgeon: Peter M Martinique, MD;  Location: [place] [facility];  Service: Cardiovascular;  Laterality: N/A;  . COLONOSCOPY WITH PROPOFOL N/A 07/31/2016   Procedure: COLONOSCOPY WITH PROPOFOL;  Surgeon: Lollie Sails, MD;  Location: [facility] ENDOSCOPY;  Service: Endoscopy;  Laterality: N/A;  . CORONARY BALLOON ANGIOPLASTY N/A 12/04/2018   Procedure: CORONARY BALLOON ANGIOPLASTY;  Surgeon: Wellington Hampshire, MD;  Location: [place] [facility];  Service: Cardiovascular;  Laterality: N/A;  . CORONARY STENT INTERVENTION N/A 01/12/2019   Procedure: CORONARY  STENT INTERVENTION;  Surgeon: [name], MD;  Location: [place] [facility];  Service: Cardiovascular;  Laterality: N/A;  . ESOPHAGOGASTRODUODENOSCOPY (EGD) WITH PROPOFOL N/A 07/31/2016   Procedure: ESOPHAGOGASTRODUODENOSCOPY (EGD) WITH PROPOFOL;  Surgeon: Lollie Sails, MD;  Location: [facility] ENDOSCOPY;  Service: Endoscopy;  Laterality: N/A;  . INTRAVASCULAR PRESSURE WIRE/FFR STUDY N/A 12/04/2018   Procedure: INTRAVASCULAR PRESSURE WIRE/FFR STUDY;  Surgeon: Wellington Hampshire, MD;  Location: [place] [facility];  Service: Cardiovascular;  Laterality: N/A;  . LAPAROSCOPIC APPENDECTOMY N/A 08/07/2016   Procedure: APPENDECTOMY LAPAROSCOPIC;  Surgeon: Hubbard Robinson, MD;  Location: [facility] ORS;  Service: General;  Laterality: N/A;  . LEFT HEART CATH AND CORONARY ANGIOGRAPHY Left 12/04/2018   Procedure: LEFT HEART CATH AND CORONARY ANGIOGRAPHY;  Surgeon: Wellington Hampshire, MD;  Location: [place] [facility];  Service: Cardiovascular;  Laterality: Left;  . LEFT HEART CATH AND CORONARY ANGIOGRAPHY N/A 01/12/2019   Procedure: LEFT HEART CATH AND CORONARY ANGIOGRAPHY;  Surgeon: [name], MD;  Location: [place] [facility];  Service: Cardiovascular;  Laterality: N/A;  . MOUTH SURGERY       A IV Location/Drains/Wounds Patient Lines/Drains/Airways Status   Active Line/Drains/Airways    Name:   Placement date:   Placement time:   Site:   Days:   Peripheral IV 07/22/19 Right Antecubital   07/22/19    2111    Antecubital   less than 1          Intake/Output Last 24 hours No intake or output data in the 24 hours ending 07/22/19 2353  Labs/Imaging Results for orders placed or performed during the hospital encounter of 07/22/19 (from the past 48 hour(s))  Lipase, blood     Status: None   Collection Time: 07/22/19  8:54 PM  Result Value Ref Range   Lipase 20 11 - 51 U/L    Comment: Performed at [facility], [address] [place], [place] [address]  Comprehensive  metabolic panel     Status: Abnormal   Collection Time: 07/22/19  8:54 PM  Result Value Ref Range   Sodium 136 135 - 145 mmol/L   Potassium 4.4 3.5 - 5.1 mmol/L   Chloride 104 98 - 111 mmol/L   CO2 19 (L) 22 - 32 mmol/L   Glucose, Bld 394 (H) 70 - 99 mg/dL   BUN 27 (H) 6 - 20 mg/dL   Creatinine, Ser 2.23 (H) 0.44 - 1.00 mg/dL   Calcium 9.0 8.9 - 10.3 mg/dL   Total Protein 7.5 6.5 - 8.1 g/dL   Albumin 3.3 (L) 3.5 - 5.0 g/dL   AST 19 15 - 41 U/L   ALT 21 0 - 44 U/L   Alkaline Phosphatase 86 38 - 126 U/L   Total Bilirubin 0.6 0.3 - 1.2 mg/dL   GFR calc non Af Amer 27 (L) >60 mL/min   GFR calc Af Amer 31 (L) >60 mL/min   Anion gap  13 5 - 15    Comment: Performed at [facility], [address] [place], [place] [address]  CBC     Status: Abnormal   Collection Time: 07/22/19  8:54 PM  Result Value Ref Range   WBC 8.3 4.0 - 10.5 K/uL   RBC 4.69 3.87 - 5.11 MIL/uL   Hemoglobin 12.3 12.0 - 15.0 g/dL   HCT 38.7 36.0 - 46.0 %   MCV 82.5 80.0 - 100.0 fL   MCH 26.2 26.0 - 34.0 pg   MCHC 31.8 30.0 - 36.0 g/dL   RDW 14.2 11.5 - 15.5 %   Platelets 496 (H) 150 - 400 K/uL   nRBC 0.0 0.0 - 0.2 %    Comment: Performed at [facility], [address] [place], [place] [address]   No results found.  Pending Labs Unresulted Labs (From admission, onward)    Start     Ordered   07/29/19 0500  Creatinine, serum  (enoxaparin (LOVENOX)    CrCl < 30 ml/min)  Weekly,   STAT    Comments: while on enoxaparin therapy.    07/22/19 2338   07/23/19 6283  Basic metabolic panel  Tomorrow morning,   STAT     07/22/19 2338   07/23/19 0500  CBC  Tomorrow morning,   STAT     07/22/19 2338   07/22/19 2339  Hemoglobin A1c  Once,   STAT    Comments: To assess prior glycemic control    07/22/19 2338   07/22/19 2339  CBC  (enoxaparin (LOVENOX)    CrCl < 30 ml/min)  Once,   STAT    Comments: Baseline for enoxaparin therapy IF NOT ALREADY DRAWN.  Notify MD if PLT < 100 K.    07/22/19 2338    07/22/19 2339  Creatinine, serum  (enoxaparin (LOVENOX)    CrCl < 30 ml/min)  Once,   STAT    Comments: Baseline for enoxaparin therapy IF NOT ALREADY DRAWN.    07/22/19 2338   07/22/19 2339  TSH  Once,   STAT     07/22/19 2338   07/22/19 2244  SARS CORONAVIRUS 2 Nasal Swab Aptima Multi Swab  (Asymptomatic/Tier 2 Patients Labs)  ONCE - STAT,   STAT    Question Answer Comment  Is this test for diagnosis or screening Screening   Symptomatic for COVID-19 as defined by CDC No   Hospitalized for COVID-19 No   Admitted to ICU for COVID-19 No   Previously tested for COVID-19 Yes   Resident in a congregate (group) care setting No   Employed in healthcare setting No   Pregnant No      07/22/19 2244   07/22/19 2143  Urine Drug Screen, Qualitative  Once,   STAT     07/22/19 2142   07/22/19 2053  Urinalysis, Complete w Microscopic  ONCE - STAT,   STAT     07/22/19 2052          Vitals/Pain Today's Vitals   07/22/19 2230 07/22/19 2300 07/22/19 2330 07/22/19 2351  BP: (!) 213/108 (!) 205/106 (!) 200/145 (!) 203/111  Pulse: (!) 116 (!) 117 93 99  Resp: 20 17 (!) 25 15  Temp:      SpO2: 99% 99% 96% 98%  Weight:      Height:      PainSc:        Isolation Precautions No active isolations  Medications Medications  clopidogrel (PLAVIX) tablet 75 mg (has no administration  in time range)  glimepiride (AMARYL) tablet 4 mg (has no administration in time range)  insulin aspart (novoLOG) injection 10 Units (has no administration in time range)  irbesartan (AVAPRO) tablet 75 mg (has no administration in time range)  isosorbide mononitrate (IMDUR) 24 hr tablet 60 mg (has no administration in time range)  metoCLOPramide (REGLAN) tablet 10 mg (has no administration in time range)  metoprolol tartrate (LOPRESSOR) tablet 100 mg (has no administration in time range)  pantoprazole (PROTONIX) EC tablet 40 mg (has no administration in time range)  amitriptyline (ELAVIL) tablet 25 mg (has no  administration in time range)  aspirin EC tablet 81 mg (has no administration in time range)  DULoxetine (CYMBALTA) DR capsule 60 mg (has no administration in time range)  canagliflozin (INVOKANA) tablet 100 mg (has no administration in time range)  insulin aspart (novoLOG) injection 0-15 Units (has no administration in time range)  insulin aspart (novoLOG) injection 0-5 Units (has no administration in time range)  mirtazapine (REMERON) tablet 60 mg (has no administration in time range)  enoxaparin (LOVENOX) injection 30 mg (has no administration in time range)  sodium chloride flush (NS) 0.9 % injection 3 mL (has no administration in time range)  0.9 %  sodium chloride infusion (has no administration in time range)  ondansetron (ZOFRAN) tablet 4 mg (has no administration in time range)    Or  ondansetron (ZOFRAN) injection 4 mg (has no administration in time range)  iohexol (OMNIPAQUE) 240 MG/ML injection 25 mL (has no administration in time range)  sodium chloride flush (NS) 0.9 % injection 3 mL (3 mLs Intravenous Given 07/22/19 2118)  ondansetron (ZOFRAN) injection 4 mg ( Intravenous Not Given 07/22/19 2346)  sodium chloride 0.9 % bolus 1,000 mL (1,000 mLs Intravenous New Bag/Given 07/22/19 2117)  morphine 4 MG/ML injection 4 mg (4 mg Intravenous Given 07/22/19 2147)  promethazine (PHENERGAN) injection 12.5 mg (12.5 mg Intravenous Given 07/22/19 2148)  metoprolol tartrate (LOPRESSOR) tablet 100 mg (100 mg Oral Given 07/22/19 2247)    Mobility walks Low fall risk   Focused Assessments abd pain    R Recommendations: See Admitting Provider Note  Report given to:   Additional Notes: none

## 2019-07-22 NOTE — ED Notes (Addendum)
Pt unable to tolerate contrast; begins immediately wretching after attempt to drink; Gardiner Barefoot NP notified; requested med order for pain, nausea and persistent HTN

## 2019-07-22 NOTE — ED Provider Notes (Signed)
[facility] Emergency Department Provider Note  ____________________________________________   First MD Initiated Contact with Patient 07/22/19 2129     (approximate)  I have reviewed the triage vital signs and the nursing notes.  History  Chief Complaint Abdominal Pain, Nausea, and Emesis    HPI [name] [name] is a 38 y.o. female with a history of CAD, CKD, chronic abdominal pain, gastroparesis, chronic/cyclical nausea and vomiting, who presents to the emergency department with upper abdominal (epigastric) abdominal pain, nausea, vomiting, consistent with prior episodes of gastroparesis.  Pain is epigastric in location, moderate in severity, does not radiate.  Worsens with vomiting.  This episode started several days ago.  Patient denies any associated chest pain or shortness of breath.  No fevers.  No sick contacts.  She reports a normal bowel movement several days ago, continues to pass flatus.  She denies any urinary symptoms including dysuria or malodorous urine.  No vaginal discharge.         Past Medical Hx Past Medical History:  Diagnosis Date  . CAD (coronary artery disease)    a. 03/2015 NSTEMI/PCI: LCX 11m (DES), OM1 100p (DES - vessel labeled Ramus in subsequent cath report). Minor irregs to LAD/RCA; b. 04/2015 Cath: LM nl, LAD 40p, RI patent stent, LCX patent stent, RCA nl, EF 55-65%; c. 02/2016 MV: basal inferolateral and mid inferolateral defect/infarct w/o ischemia. EF 55-65%; d. 11/2018 PCI: LM 30ost, LAD 30p, LCX 95p/m (PTCA->20%), OM1 10ost, OM2 40ost, RCA 85p.  Marland Kitchen Chronic abdominal pain   . Chronic diastolic (congestive) heart failure ([place])    a. 03/2015 Echo: EF 30-35%, mild concentric LVH, severe HK of inf, inflat, & lat walls, mod MR, mild TR;  b. 04/2015 LV Gram: EF 55-65%; c. 09/2017 Echo: EF 55-60%, no rwma. Mild MR; d. 11/2018 Echo: Ef 60-65%, no rwma.  . CKD (chronic kidney disease), stage III ([place])   . Diabetes type 2, controlled ([place])     a. since 2002   . Diabetic gastroparesis (HCC)    a. chronic nausea/vomiting.  . Diverticulosis, sigmoid 07/2016  . Heavy menses    a. H/O IUD - expired in 2014 - remains in place.  . Hyperlipidemia    Intolerant of atorvastatin, rosuvastatin  . Hypertension   . Iron deficiency anemia   . Ischemic cardiomyopathy (resolved)    a. 03/2015 EF 30-35% post NSTEMI;  b. 04/2015 EF 55-65% on LV gram; c. 09/2017 Echo: EF 55-60%; d. 11/2018 Echo: Ef 60-65%.  . Obesity   . Tobacco abuse    a. quit 03/2015.  Marland Kitchen Uterine fibroid   . Vertigo   . Vitamin D deficiency     Problem List Patient Active Problem List   Diagnosis Date Noted  . Hyperlipidemia   . Preop cardiovascular exam   . Diabetic gastroparesis ([place]) 06/18/2019  . Anemia 01/14/2019  . Stable angina ([place]) 01/12/2019  . Thrombocytosis ([place]) 01/05/2019  . Coronary artery disease of native artery of native heart with stable angina pectoris ([place]) 12/17/2018  . Unstable angina ([place]) 12/04/2018  . Diabetic gastroparesis associated with type 2 diabetes mellitus ([place]) 05/02/2018  . CKD stage 3 due to type 2 diabetes mellitus ([place]) 04/06/2018  . Poorly controlled type 2 diabetes mellitus ([place]) 08/27/2017  . Lung nodule < 6cm on CT 08/12/2017  . Depression, major, single episode, severe ([place]) 08/12/2017  . Chronic abdominal pain 08/03/2017  . Obstructive sleep apnea 05/16/2017  . Neuropathy 01/17/2017  . Vitamin D deficiency 10/25/2016  .  Insomnia 10/25/2016  . 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 Elevated liver function tests 06/05/2016  . Chronic diastolic heart failure ([place]) 03/19/2016  . Proteinuria 08/30/2015  . CAD in native artery   . Angina pectoris ([place])   . Iron deficiency anemia   . Hyperlipidemia associated with type 2 diabetes mellitus ([place]) 04/13/2015  . Ischemic cardiomyopathy   . Obesity     Past Surgical Hx Past Surgical History:  Procedure Laterality Date   . APPENDECTOMY    . CARDIAC CATHETERIZATION  4/16   x2 stent [facility]  . CARDIAC CATHETERIZATION N/A 05/05/2015   Procedure: Left Heart Cath and Coronary Angiography;  Surgeon: Peter M Martinique, MD;  Location: [place] CV LAB;  Service: Cardiovascular;  Laterality: N/A;  . COLONOSCOPY WITH PROPOFOL N/A 07/31/2016   Procedure: COLONOSCOPY WITH PROPOFOL;  Surgeon: Lollie Sails, MD;  Location: [facility] ENDOSCOPY;  Service: Endoscopy;  Laterality: N/A;  . CORONARY BALLOON ANGIOPLASTY N/A 12/04/2018   Procedure: CORONARY BALLOON ANGIOPLASTY;  Surgeon: Wellington Hampshire, MD;  Location: [place] [facility] LAB;  Service: Cardiovascular;  Laterality: N/A;  . CORONARY STENT INTERVENTION N/A 01/12/2019   Procedure: CORONARY STENT INTERVENTION;  Surgeon: [name], MD;  Location: [place] [facility];  Service: Cardiovascular;  Laterality: N/A;  . ESOPHAGOGASTRODUODENOSCOPY (EGD) WITH PROPOFOL N/A 07/31/2016   Procedure: ESOPHAGOGASTRODUODENOSCOPY (EGD) WITH PROPOFOL;  Surgeon: Lollie Sails, MD;  Location: [facility] ENDOSCOPY;  Service: Endoscopy;  Laterality: N/A;  . INTRAVASCULAR PRESSURE WIRE/FFR STUDY N/A 12/04/2018   Procedure: INTRAVASCULAR PRESSURE WIRE/FFR STUDY;  Surgeon: Wellington Hampshire, MD;  Location: [place] [facility] LAB;  Service: Cardiovascular;  Laterality: N/A;  . LAPAROSCOPIC APPENDECTOMY N/A 08/07/2016   Procedure: APPENDECTOMY LAPAROSCOPIC;  Surgeon: Hubbard Robinson, MD;  Location: [facility] ORS;  Service: General;  Laterality: N/A;  . LEFT HEART CATH AND CORONARY ANGIOGRAPHY Left 12/04/2018   Procedure: LEFT HEART CATH AND CORONARY ANGIOGRAPHY;  Surgeon: Wellington Hampshire, MD;  Location: [place] CV LAB;  Service: Cardiovascular;  Laterality: Left;  . LEFT HEART CATH AND CORONARY ANGIOGRAPHY N/A 01/12/2019   Procedure: LEFT HEART CATH AND CORONARY ANGIOGRAPHY;  Surgeon: [name], MD;  Location: [place] [facility];  Service: Cardiovascular;  Laterality: N/A;  . MOUTH SURGERY       Medications Prior to Admission medications   Medication Sig Start Date End Date Taking? Authorizing Provider  amitriptyline (ELAVIL) 25 MG tablet Take 25 mg by mouth at bedtime. 04/18/19   [provider]  aspirin EC 81 MG tablet Take 81 mg by mouth daily.    [provider]  Cholecalciferol 2000 units TABS Take 2,000 Units by mouth daily.     [provider]  clopidogrel (PLAVIX) 75 MG tablet Take 1 tablet (75 mg total) by mouth daily. 06/22/19 07/22/19  Saundra Shelling, MD  DULoxetine (CYMBALTA) 30 MG capsule Take 60 mg by mouth daily.  07/01/18   [provider]  etonogestrel (NEXPLANON) 68 MG IMPL implant 1 each by Subdermal route once.    [provider]  FARXIGA 10 MG TABS tablet Take 10 mg by mouth daily.  09/08/18   [provider]  furosemide (LASIX) 40 MG tablet Take 1 tablet (40 mg total) by mouth every other day. 01/19/19 07/18/19  End, Harrell Gave, MD  glimepiride (AMARYL) 4 MG tablet Take 1 tablet (4 mg total) by mouth daily. 07/08/19   Cannady, Henrine Screws T, NP  insulin aspart (  NOVOLOG) 100 UNIT/ML injection Inject 10 Units into the skin 3 (three) times daily with meals. 05/04/18   Loletha Grayer, MD  insulin glargine (LANTUS) 100 UNIT/ML injection Inject 60 Units into the skin daily.    [provider]  irbesartan (AVAPRO) 75 MG tablet Take 1 tablet (75 mg total) by mouth daily. 04/29/19   Wellington Hampshire, MD  isosorbide mononitrate (IMDUR) 30 MG 24 hr tablet Take 2 tablets (60 mg total) by mouth daily. 12/17/18   Rise Mu, PA-C  loperamide (IMODIUM A-D) 2 MG tablet Take 8-12 mg by mouth daily as needed for diarrhea or loose stools.    [provider]  meclizine (ANTIVERT) 25 MG tablet Take 25 mg by mouth 3 (three) times daily as needed for dizziness.    [provider]  metoCLOPramide (REGLAN) 10 MG tablet Take 1 tablet (10 mg total) by mouth 3 (three) times daily with meals for 10 days. 04/17/19 06/18/19   Menshew, Dannielle Karvonen, PA-C  metoprolol tartrate (LOPRESSOR) 100 MG tablet Take 1 tablet (100 mg total) by mouth 2 (two) times daily. 04/29/19   Wellington Hampshire, MD  mirtazapine (REMERON) 30 MG tablet Take 60 mg by mouth daily.  08/12/18   [provider]  nitroGLYCERIN (NITROSTAT) 0.4 MG SL tablet Take 1 tab under your tongue while sitting for chest pain, If no relief may repeat, one tab every 5 min up to 3 tabs total over 15 mins. 06/09/18   Theora Gianotti, NP  ondansetron (ZOFRAN) 4 MG tablet Take 1 tablet (4 mg total) by mouth every 8 (eight) hours as needed for nausea or vomiting. 04/16/19   Schuyler Amor, MD  pantoprazole (PROTONIX) 40 MG tablet Take 1 tablet (40 mg total) by mouth 2 (two) times daily. 06/22/19 07/22/19  Saundra Shelling, MD  polyethylene glycol (MIRALAX / GLYCOLAX) 17 g packet Take 17 g by mouth daily as needed.  04/18/19   [provider]  prochlorperazine (COMPAZINE) 5 MG tablet Take 1 tablet (5 mg total) by mouth every 6 (six) hours as needed for nausea or vomiting. 06/30/19   Pabon, Diego F, MD  traMADol (ULTRAM) 50 MG tablet Take 1 tablet (50 mg total) by mouth every 6 (six) hours as needed. 06/25/19 06/24/20  Lavonia Drafts, MD    Allergies Lisinopril, Rosemary oil, Shellfish allergy, Other, and Metformin and related  Family Hx Family History  Problem Relation Age of Onset  . Diabetes Father   . Cancer Maternal Grandmother        lung  . Cancer Maternal Grandfather        prostate  . Diabetes Paternal Grandfather   . Diabetes Paternal Grandmother   . Cancer - Cervical Maternal Aunt     Social Hx Social History   Tobacco Use  . Smoking status: Former Smoker    Years: 15.00    Quit date: 03/10/2015    Years since quitting: 4.3  . Smokeless tobacco: Never Used  Substance Use Topics  . Alcohol use: No    Alcohol/week: 0.0 standard drinks  . Drug use: No     Review of Systems  Constitutional: Negative for fever. Negative for  chills. Eyes: Negative for visual changes. ENT: Negative for sore throat. Cardiovascular: Negative for chest pain. Respiratory: Negative for shortness of breath. Gastrointestinal: + for abdominal pain. + for nausea. + for vomiting. Genitourinary: Negative for dysuria. Musculoskeletal: Negative for leg swelling. Skin: Negative for rash. Neurological: Negative for for headaches.  Physical Exam  Vital Signs: ED Triage Vitals  Enc Vitals Group     BP 07/22/19 2050 (!) 163/103     Pulse Rate 07/22/19 2050 (!) 116     Resp 07/22/19 2050 19     Temp 07/22/19 2050 98 F (36.7 C)     Temp src --      SpO2 07/22/19 2050 100 %     Weight 07/22/19 2051 260 lb (117.9 kg)     Height 07/22/19 2051 5\' 4"  (1.626 m)     Head Circumference --      Peak Flow --      Pain Score 07/22/19 2050 10     Pain Loc --      Pain Edu? --      Excl. in [place]? --     Constitutional: Alert and oriented. Obese. Eyes: Conjunctivae clear. Sclera anicteric. Head: Normocephalic. Atraumatic. Nose: No congestion. No rhinorrhea. Mouth/Throat: Mucous membranes are moist.  Neck: No stridor.   Cardiovascular: Normal rate, regular rhythm. No murmurs. Extremities well perfused. Respiratory: Normal respiratory effort.  Lungs CTAB. Gastrointestinal: Soft, mild epigastric discomfort w/ palpation, no rebound or guarding. Remainder of abdomen soft and NT. Musculoskeletal: No lower extremity edema. Neurologic:  Normal speech and language. No gross focal neurologic deficits are appreciated.  Skin: Skin is warm, dry and intact. No rash noted. Psychiatric: Mood and affect are appropriate for situation.  EKG  Personally reviewed.   Rate: tachycardic, 115 Rhythm: sinus Axis: leftward Intervals: within normal limits No acute ischemic changes   Radiology  N/A   Procedures  Procedure(s) performed (including critical care):  Procedures   Initial Impression / Assessment and Plan / ED Course  38 y.o. female  with a history of CAD, CKD, chronic abdominal pain, gastroparesis, chronic/cyclical nausea and vomiting, who presents to the emergency department with upper abdominal (epigastric) abdominal pain, nausea, vomiting, consistent with prior episodes of gastroparesis.   On exam, she is hypertensive, tachycardic, forcefully dry heaving.  Abdomen is soft, mild epigastric discomfort.  Presentation consistent with prior episodes of gastroparesis. Also consider pancreatitis, cannabis hyperemesis. Recent admission in mid July included [place] with cholelithiasis, followed up by [place] hepatobiliary scan with no evidence of infection or acute obstruction.  Given her normal bowel movements, still passing flatus, do not suspect bowel obstruction.  Suspect HTN/tachycardia at least partially 2/2 inability to keep down her home metoprolol. Will plan for labs, fluids, symptomatic treatment, and reevaluate.  Work-up reveals AKI, with creatinine 2.23 from 1.4 prior, decreased bicarb, likely in the setting of her volume losses.  She reports feeling mildly improved after medications, though still nauseous.  Given her AKI, and continued nausea, will plan for admission for further symptomatic control (which will also facilitate her ability to tolerate her home medications) and rehydration.  Will order her home metoprolol.   Final Clinical Impression(s) / ED Diagnosis  Final diagnoses:  Nausea and vomiting in adult  Gastroparesis     Note:  This document was prepared using Dragon voice recognition software and may include unintentional dictation errors.   Lilia Pro., MD 07/22/19 2322

## 2019-07-22 NOTE — ED Notes (Signed)
Pt resting uprite, eyes closed with no distress; reports feeling better with decreased nausea and pain

## 2019-07-22 NOTE — ED Notes (Addendum)
Pt assisted into hosp gown and on card monitor; since yesterday having mid upper abd pain radiating into back accomp by N/V; st hx of same with gallstones and has been eval by Dr Dahlia Byes last month for such; mother at bedside; resp even/unlab, lungs clear, apical audible & regular, +BS, abd soft/nondist, tender to upper abd; pt appears uncomfortable, rocking back & forth, wretching loudly

## 2019-07-22 NOTE — ED Notes (Signed)
CT tech to bedside with PO contrast....allergies reviewed with patient...instructions for administration of PO contrast reviewed with patient -- patient verbalizes understanding of process. RN to f/u with and encourage patient to consume PO contrast volume. Patient to notify RN when volume completed and for any difficulties experienced while drinking --Patient verbalizes understanding  

## 2019-07-23 ENCOUNTER — Encounter: Payer: Self-pay | Admitting: Neurology

## 2019-07-23 ENCOUNTER — Inpatient Hospital Stay: Payer: Medicare Other

## 2019-07-23 LAB — GLUCOSE, CAPILLARY
Glucose-Capillary: 409 mg/dL — ABNORMAL HIGH (ref 70–99)
Glucose-Capillary: 377 mg/dL — ABNORMAL HIGH (ref 70–99)
Glucose-Capillary: 330 mg/dL — ABNORMAL HIGH (ref 70–99)
Glucose-Capillary: 297 mg/dL — ABNORMAL HIGH (ref 70–99)
Glucose-Capillary: 123 mg/dL — ABNORMAL HIGH (ref 70–99)
Glucose-Capillary: 82 mg/dL (ref 70–99)
Glucose-Capillary: 134 mg/dL — ABNORMAL HIGH (ref 70–99)
Glucose-Capillary: 132 mg/dL — ABNORMAL HIGH (ref 70–99)

## 2019-07-23 LAB — URINALYSIS, COMPLETE (UACMP) WITH MICROSCOPIC
Bilirubin Urine: NEGATIVE
Glucose, UA: >=500 mg/dL — AB
Ketones, ur: 20 mg/dL — AB
Nitrite: NEGATIVE
Protein, ur: 100 mg/dL — AB
Specific Gravity, Urine: 1.013 (ref 1.005–1.030)
pH: 6 (ref 5.0–8.0)

## 2019-07-23 LAB — CBC
HCT: 37.8 % (ref 36.0–46.0)
Hemoglobin: 11.6 g/dL — ABNORMAL LOW (ref 12.0–15.0)
MCH: 25.6 pg — ABNORMAL LOW (ref 26.0–34.0)
MCHC: 30.7 g/dL (ref 30.0–36.0)
MCV: 83.4 fL (ref 80.0–100.0)
Platelets: 449 10*3/uL — ABNORMAL HIGH (ref 150–400)
RBC: 4.53 MIL/uL (ref 3.87–5.11)
RDW: 14.3 % (ref 11.5–15.5)
WBC: 10.4 10*3/uL (ref 4.0–10.5)
nRBC: 0 % (ref 0.0–0.2)

## 2019-07-23 LAB — HEMOGLOBIN A1C
Hgb A1c MFr Bld: 8.3 % — ABNORMAL HIGH (ref 4.8–5.6)
Hgb A1c MFr Bld: 8.2 % — ABNORMAL HIGH (ref 4.8–5.6)
Mean Plasma Glucose: 191.51 mg/dL
Mean Plasma Glucose: 188.64 mg/dL

## 2019-07-23 LAB — URINE DRUG SCREEN, QUALITATIVE (ARMC ONLY)
Amphetamines, Ur Screen: NOT DETECTED
Barbiturates, Ur Screen: NOT DETECTED
Benzodiazepine, Ur Scrn: NOT DETECTED
Cannabinoid 50 Ng, Ur ~~LOC~~: POSITIVE — AB
Cocaine Metabolite,Ur ~~LOC~~: NOT DETECTED
MDMA (Ecstasy)Ur Screen: NOT DETECTED
Methadone Scn, Ur: NOT DETECTED
Opiate, Ur Screen: POSITIVE — AB
Phencyclidine (PCP) Ur S: NOT DETECTED
Tricyclic, Ur Screen: NOT DETECTED

## 2019-07-23 LAB — BASIC METABOLIC PANEL
Anion gap: 9 (ref 5–15)
BUN: 25 mg/dL — ABNORMAL HIGH (ref 6–20)
CO2: 22 mmol/L (ref 22–32)
Calcium: 8.8 mg/dL — ABNORMAL LOW (ref 8.9–10.3)
Chloride: 108 mmol/L (ref 98–111)
Creatinine, Ser: 1.71 mg/dL — ABNORMAL HIGH (ref 0.44–1.00)
GFR calc Af Amer: 43 mL/min — ABNORMAL LOW (ref >60)
GFR calc non Af Amer: 37 mL/min — ABNORMAL LOW (ref >60)
Glucose, Bld: 399 mg/dL — ABNORMAL HIGH (ref 70–99)
Potassium: 5 mmol/L (ref 3.5–5.1)
Sodium: 139 mmol/L (ref 135–145)

## 2019-07-23 LAB — SARS CORONAVIRUS 2 (TAT 6-24 HRS): SARS Coronavirus 2: NEGATIVE

## 2019-07-23 LAB — TSH: TSH: 1.005 u[IU]/mL (ref 0.350–4.500)

## 2019-07-23 LAB — MRSA PCR SCREENING: MRSA by PCR: NEGATIVE

## 2019-07-23 MED ORDER — CALCIUM CARBONATE ANTACID 500 MG PO CHEW
1.0000 | CHEWABLE_TABLET | Freq: Four times a day (QID) | ORAL | Status: DC | PRN
  Administered 2019-07-23: 200 mg via ORAL
  Filled 2019-07-23: qty 1

## 2019-07-23 MED ORDER — HYDRALAZINE HCL 20 MG/ML IJ SOLN
10.0000 mg | INTRAMUSCULAR | Status: AC
  Administered 2019-07-23: 01:00:00 10 mg via INTRAVENOUS

## 2019-07-23 MED ORDER — ALUM & MAG HYDROXIDE-SIMETH 200-200-20 MG/5ML PO SUSP
30.0000 mL | Freq: Four times a day (QID) | ORAL | Status: DC | PRN
  Administered 2019-07-24 – 2019-07-27 (×3): 30 mL via ORAL
  Filled 2019-07-23 (×3): qty 30

## 2019-07-23 MED ORDER — MORPHINE SULFATE (PF) 2 MG/ML IV SOLN
INTRAVENOUS | Status: AC
  Administered 2019-07-23: 2 mg via INTRAVENOUS
  Filled 2019-07-23: qty 1

## 2019-07-23 MED ORDER — MIRTAZAPINE 15 MG PO TABS
60.0000 mg | ORAL_TABLET | Freq: Every day | ORAL | Status: DC
  Administered 2019-07-24 – 2019-07-27 (×4): 60 mg via ORAL
  Filled 2019-07-23 (×4): qty 4

## 2019-07-23 MED ORDER — INSULIN ASPART 100 UNIT/ML ~~LOC~~ SOLN: 0.0000 [IU] | Freq: Every day | SUBCUTANEOUS | Status: DC

## 2019-07-23 MED ORDER — MORPHINE SULFATE (PF) 2 MG/ML IV SOLN
1.0000 mg | Freq: Once | INTRAVENOUS | Status: AC
  Administered 2019-07-23: 13:00:00 1 mg via INTRAVENOUS

## 2019-07-23 MED ORDER — INSULIN ASPART 100 UNIT/ML ~~LOC~~ SOLN
0.0000 [IU] | SUBCUTANEOUS | Status: DC
  Administered 2019-07-23: 02:00:00 20 [IU] via SUBCUTANEOUS
  Administered 2019-07-23 (×2): 15 [IU] via SUBCUTANEOUS
  Filled 2019-07-23 (×2): qty 1

## 2019-07-23 MED ORDER — INSULIN ASPART 100 UNIT/ML ~~LOC~~ SOLN
0.0000 [IU] | Freq: Three times a day (TID) | SUBCUTANEOUS | Status: DC
  Administered 2019-07-24: 7 [IU] via SUBCUTANEOUS
  Administered 2019-07-24: 13:00:00 4 [IU] via SUBCUTANEOUS
  Administered 2019-07-25: 17:00:00 3 [IU] via SUBCUTANEOUS
  Administered 2019-07-25: 12:00:00 7 [IU] via SUBCUTANEOUS
  Administered 2019-07-25: 10:00:00 4 [IU] via SUBCUTANEOUS
  Administered 2019-07-26 – 2019-07-27 (×3): 3 [IU] via SUBCUTANEOUS
  Filled 2019-07-23 (×8): qty 1

## 2019-07-23 MED ORDER — INSULIN ASPART 100 UNIT/ML ~~LOC~~ SOLN: 0.0000 [IU] | Freq: Three times a day (TID) | SUBCUTANEOUS | Status: DC

## 2019-07-23 MED ORDER — INSULIN GLARGINE 100 UNIT/ML ~~LOC~~ SOLN
10.0000 [IU] | Freq: Every day | SUBCUTANEOUS | Status: DC
  Administered 2019-07-24 – 2019-07-27 (×4): 10 [IU] via SUBCUTANEOUS
  Filled 2019-07-23 (×6): qty 0.1

## 2019-07-23 MED ORDER — HYDRALAZINE HCL 20 MG/ML IJ SOLN
INTRAMUSCULAR | Status: AC
  Administered 2019-07-23: 10 mg via INTRAVENOUS
  Filled 2019-07-23: qty 1

## 2019-07-23 MED ORDER — MORPHINE SULFATE (PF) 2 MG/ML IV SOLN
2.0000 mg | INTRAVENOUS | Status: AC
  Administered 2019-07-23: 2 mg via INTRAVENOUS

## 2019-07-23 MED ORDER — HEPARIN SODIUM (PORCINE) 5000 UNIT/ML IJ SOLN
5000.0000 [IU] | Freq: Three times a day (TID) | INTRAMUSCULAR | Status: DC
  Administered 2019-07-23 – 2019-07-28 (×16): 5000 [IU] via SUBCUTANEOUS
  Filled 2019-07-23 (×16): qty 1

## 2019-07-23 MED ORDER — MORPHINE SULFATE (PF) 2 MG/ML IV SOLN
INTRAVENOUS | Status: AC
  Administered 2019-07-23: 13:00:00 1 mg via INTRAVENOUS
  Filled 2019-07-23: qty 1

## 2019-07-23 MED ORDER — HYDRALAZINE HCL 20 MG/ML IJ SOLN
10.0000 mg | Freq: Four times a day (QID) | INTRAMUSCULAR | Status: DC | PRN
  Administered 2019-07-23 – 2019-07-27 (×3): 10 mg via INTRAVENOUS
  Filled 2019-07-23 (×3): qty 1

## 2019-07-23 MED ORDER — ONDANSETRON HCL 4 MG/2ML IJ SOLN
INTRAMUSCULAR | Status: AC
  Administered 2019-07-23: 4 mg via INTRAVENOUS
  Filled 2019-07-23: qty 2

## 2019-07-23 MED ORDER — METOCLOPRAMIDE HCL 5 MG/ML IJ SOLN
10.0000 mg | Freq: Four times a day (QID) | INTRAMUSCULAR | Status: DC
  Administered 2019-07-23 – 2019-07-28 (×18): 10 mg via INTRAVENOUS
  Filled 2019-07-23 (×20): qty 2

## 2019-07-23 MED ORDER — INSULIN ASPART 100 UNIT/ML ~~LOC~~ SOLN: 0.0000 [IU] | SUBCUTANEOUS | Status: DC

## 2019-07-23 MED ORDER — PROCHLORPERAZINE EDISYLATE 10 MG/2ML IJ SOLN
10.0000 mg | Freq: Four times a day (QID) | INTRAMUSCULAR | Status: DC | PRN
  Administered 2019-07-23 – 2019-07-25 (×5): 10 mg via INTRAVENOUS
  Filled 2019-07-23 (×7): qty 2

## 2019-07-23 NOTE — Progress Notes (Addendum)
Inpatient Diabetes Program Recommendations  AACE/ADA: New Consensus Statement on Inpatient Glycemic Control   Target Ranges:  Prepandial:   less than 140 mg/dL      Peak postprandial:   less than 180 mg/dL (1-2 hours)      Critically ill patients:  140 - 180 mg/dL   Results for RAHCEL, SHUTES (MRN [id number]) as of 07/23/2019 08:42  Ref. Range 07/23/2019 01:16 07/23/2019 04:31 07/23/2019 05:57 07/23/2019 07:44  Glucose-Capillary Latest Ref Range: 70 - 99 mg/dL 409 (H) 377 (H) 330 (H) 297 (H)   Review of Glycemic Control  Diabetes history: DM2 Outpatient Diabetes medications: Novolog 10 units TID with meals, Amaryl 4 mg daily, Farxiga 10 mg daily Current orders for Inpatient glycemic control: Novolog 0-20 units Q4H, Novolog 0-5 units QHS, Novolog 10 units TID with meals  Inpatient Diabetes Program Recommendations:   Insulin-Correction: Please change frequency of CBGs and Novolog 0-20 units to TID with meals.  Insulin - Basal: If glucose continues to be consistently greater than 180 mg/dl, please consider ordering Lantus 10 units daily starting now.  HgbA1C: A1C 8.3% on 07/22/19 indicating an average glucose of 192 mg/dl over the past 2-3 months.  Thanks, Barnie Alderman, RN, MSN, CDE Diabetes Coordinator Inpatient Diabetes Program [phone] (Team Pager from 8am to 5pm)

## 2019-07-23 NOTE — ED Notes (Addendum)
Pt returned to room; Gardiner Barefoot NP at bedside

## 2019-07-23 NOTE — ED Notes (Signed)
CT notified to proceed with IV contrast only per Dr Gwendalyn Ege

## 2019-07-23 NOTE — ED Notes (Signed)
BP now 182/99; pt ready to transport to floor now per Gardiner Barefoot, NP; Adonis Huguenin, RN care nurse on floor notified

## 2019-07-23 NOTE — ED Notes (Signed)
Pt to CT via stretcher accomp by CT tech 

## 2019-07-23 NOTE — Progress Notes (Signed)
[place] [facility] [place] NAME: [name] [name]    MR#:  [id number]  DATE OF BIRTH:  [birth year]  SUBJECTIVE:   Patient here due to intractable nausea vomiting suspected to have gastroparesis.  Patient still having persistent vomiting and is complaining of abdominal pain.  CT abdomen pelvis on admission was negative for acute pathology.  No other complaints presently.  REVIEW OF SYSTEMS:    Review of Systems  Constitutional: Negative for chills and fever.  HENT: Negative for congestion and tinnitus.   Eyes: Negative for blurred vision and double vision.  Respiratory: Negative for cough, shortness of breath and wheezing.   Cardiovascular: Negative for chest pain, orthopnea and PND.  Gastrointestinal: Positive for abdominal pain, nausea and vomiting. Negative for diarrhea.  Genitourinary: Negative for dysuria and hematuria.  Neurological: Negative for dizziness, sensory change and focal weakness.  All other systems reviewed and are negative.   Nutrition: Clear liquids Tolerating Diet: Yes Tolerating PT: Ambulatory  DRUG ALLERGIES:   Allergies  Allergen Reactions  . Lisinopril Anaphylaxis, Itching and Swelling  . Rosemary Oil Anaphylaxis  . Shellfish Allergy Itching and Swelling  . Other Itching and Swelling    [place] seasoning  . Metformin And Related Diarrhea, Nausea And Vomiting and Rash    VITALS:  Blood pressure (!) 150/84, pulse 94, temperature 98.3 F (36.8 C), temperature source Oral, resp. rate 17, height 5\' 4"  (1.626 m), weight 114 kg, SpO2 100 %.  PHYSICAL EXAMINATION:   Physical Exam  GENERAL:  38 y.o.-year-old obese patient lying in bed in no acute distress.  EYES: Pupils equal, round, reactive to light and accommodation. No scleral icterus. Extraocular muscles intact.  HEENT: Head atraumatic, normocephalic. Oropharynx and nasopharynx clear.  NECK:  Supple, no jugular venous distention. No thyroid enlargement, no  tenderness.  LUNGS: Normal breath sounds bilaterally, no wheezing, rales, rhonchi. No use of accessory muscles of respiration.  CARDIOVASCULAR: S1, S2 normal. No murmurs, rubs, or gallops.  ABDOMEN: Soft, Tender in lower abdomen, but no rebound, rigidity, nondistended. Bowel sounds present. No organomegaly or mass.  EXTREMITIES: No cyanosis, clubbing or edema b/l.    NEUROLOGIC: Cranial nerves II through XII are intact. No focal Motor or sensory deficits b/l.   PSYCHIATRIC: The patient is alert and oriented x 3.  SKIN: No obvious rash, lesion, or ulcer.    LABORATORY PANEL:   CBC Recent Labs  Lab 07/23/19 0528  WBC 10.4  HGB 11.6*  HCT 37.8  PLT 449*   ------------------------------------------------------------------------------------------------------------------  Chemistries  Recent Labs  Lab 07/22/19 2054 07/23/19 0528  NA 136 139  K 4.4 5.0  CL 104 108  CO2 19* 22  GLUCOSE 394* 399*  BUN 27* 25*  CREATININE 2.23* 1.71*  CALCIUM 9.0 8.8*  AST 19  --   ALT 21  --   ALKPHOS 86  --   BILITOT 0.6  --    ------------------------------------------------------------------------------------------------------------------  Cardiac Enzymes No results for input(s): TROPONINI in the last 168 hours. ------------------------------------------------------------------------------------------------------------------  RADIOLOGY:  Ct Abdomen Wo Contrast  Result Date: 07/23/2019 CLINICAL DATA:  38 year old female with abdominal pain. Gastroparesis and chronic abdominal pain. EXAM: CT ABDOMEN WITHOUT CONTRAST TECHNIQUE: Multidetector CT imaging of the abdomen was performed following the standard protocol without IV contrast. COMPARISON:  CT of the abdomen pelvis dated 06/19/2019 FINDINGS: Evaluation of this exam is limited in the absence of intravenous contrast. Lower chest: The visualized lung bases are clear. There is coronary vascular  calcification. There is hypoattenuation of the  cardiac blood pool suggestive of a degree of anemia. Clinical correlation is recommended. No free air or free fluid noted in the abdomen. Hepatobiliary: There is mild fatty infiltration of the liver. No intrahepatic biliary ductal dilatation. The gallbladder is unremarkable. Pancreas: Unremarkable. No pancreatic ductal dilatation or surrounding inflammatory changes. Spleen: Normal in size without focal abnormality. Adrenals/Urinary Tract: The adrenal glands are unremarkable. There is no hydronephrosis or nephrolithiasis on either side. The visualized ureters appear unremarkable. Stomach/Bowel: Oral contrast is noted the stomach. The stomach is unremarkable. There is no bowel dilatation the visualized abdomen. No inflammatory changes of the bowel noted. Vascular/Lymphatic: Mild atherosclerotic calcification of the aorta. There is mild haziness of the fat surrounding celiac axis and SMA similar to prior CT, nonspecific. No portal venous gas. There is no adenopathy. Other: None Musculoskeletal: No acute or significant osseous findings. IMPRESSION: 1. No acute intra-abdominal pathology. 2. Mild fatty liver. 3. Coronary vascular calcification and aortic Atherosclerosis (ICD10-I70.0). Electronically Signed   By: Anner Crete M.D.   On: 07/23/2019 00:51     ASSESSMENT AND PLAN:   38 year old female with past medical history of hypertension hyperlipidemia, diabetes, CKD stage III, chronic diastolic CHF, history of coronary artery disease, tobacco abuse, vitamin D deficiency who presented to the hospital due to intractable nausea and vomiting.  1.  Intractable nausea and vomiting-secondary to suspected diabetic gastroparesis.  Patient CT abdomen pelvis showed no evidence of intra-abdominal pathology. - We will continue IV fluids, start her on scheduled Reglan, placed on clear liquid diet, follow clinically.  2.  Acute on chronic renal failure- patient has CKD stage III with baseline creatinine around 1.4-1.7,  patient presented to the hospital the creatinine over 2, improving with IV fluid hydration, will continue to monitor. - hold Avapro.   3.  Essential hypertension- continue metoprolol, Imdur, hydralazine as needed.  4.  Diabetes type 2 with CKD stage III- pre-she diabetes coordinator input, continue sliding scale insulin, on NovoLog with meals, will add some low-dose Lantus.  5.  GERD-continue Protonix.  6.  Depression-continue amitriptyline, Cymbalta.  All the records are reviewed and case discussed with Care Management/Social Worker. Management plans discussed with the patient, family and they are in agreement.  CODE STATUS: Full code  DVT Prophylaxis: Hep. SQ  TOTAL TIME TAKING CARE OF THIS PATIENT: 30 minutes.   POSSIBLE D/C IN 1-2 DAYS, DEPENDING ON CLINICAL CONDITION.   Henreitta Leber M.D on 07/23/2019 at 1:32 PM  Between 7am to 6pm - Pager - [phone]  After 6pm go to [email or web address] - Technical brewer [facility]  Office  [phone]  CC: Primary care physician; Venita Lick, NP

## 2019-07-23 NOTE — Progress Notes (Signed)
Called Dr about patient BS being 409. Awaiting new orders from Dr, will continue to monitor.

## 2019-07-23 NOTE — ED Notes (Addendum)
Attempted to call report to floor, st nurse will call back

## 2019-07-24 LAB — GLUCOSE, CAPILLARY
Glucose-Capillary: 144 mg/dL — ABNORMAL HIGH (ref 70–99)
Glucose-Capillary: 163 mg/dL — ABNORMAL HIGH (ref 70–99)
Glucose-Capillary: 175 mg/dL — ABNORMAL HIGH (ref 70–99)
Glucose-Capillary: 176 mg/dL — ABNORMAL HIGH (ref 70–99)
Glucose-Capillary: 239 mg/dL — ABNORMAL HIGH (ref 70–99)
Glucose-Capillary: 247 mg/dL — ABNORMAL HIGH (ref 70–99)
Glucose-Capillary: 82 mg/dL (ref 70–99)

## 2019-07-24 LAB — BASIC METABOLIC PANEL
Anion gap: 10 (ref 5–15)
BUN: 18 mg/dL (ref 6–20)
CO2: 20 mmol/L — ABNORMAL LOW (ref 22–32)
Calcium: 8.4 mg/dL — ABNORMAL LOW (ref 8.9–10.3)
Chloride: 106 mmol/L (ref 98–111)
Creatinine, Ser: 1.36 mg/dL — ABNORMAL HIGH (ref 0.44–1.00)
GFR calc Af Amer: 57 mL/min — ABNORMAL LOW (ref >60)
GFR calc non Af Amer: 49 mL/min — ABNORMAL LOW (ref >60)
Glucose, Bld: 280 mg/dL — ABNORMAL HIGH (ref 70–99)
Potassium: 4 mmol/L (ref 3.5–5.1)
Sodium: 136 mmol/L (ref 135–145)

## 2019-07-24 MED ORDER — MORPHINE SULFATE (PF) 2 MG/ML IV SOLN
2.0000 mg | INTRAVENOUS | Status: DC | PRN
  Administered 2019-07-24 – 2019-07-27 (×9): 2 mg via INTRAVENOUS
  Filled 2019-07-24 (×9): qty 1

## 2019-07-24 MED ORDER — POLYETHYLENE GLYCOL 3350 17 G PO PACK
17.0000 g | PACK | Freq: Every day | ORAL | Status: DC
  Administered 2019-07-24 – 2019-07-27 (×4): 17 g via ORAL
  Filled 2019-07-24 (×4): qty 1

## 2019-07-24 NOTE — Progress Notes (Signed)
Patient having 10/10 pain. Dr. Sidney Ace, on call physician, notified. Had me order 2mg  IV morphine Q4H PRN for moderate-sever pain.  Her Accuceck orders were also Q2H. He had me change this over to Q4H as her sugars have been normal.   Will continue to monitor.

## 2019-07-24 NOTE — Progress Notes (Signed)
Morphine given for severe abdominal cramping/sharp in nature with noted elevated BP-check BP normal limits. Zofran given for nausea with nausea controlled; no emesis. Limited po intake. Reports no BM since Tuesday which is not unusual for her but she takes miralax at home- MD notified. Slept at long intervals. IVF's continued. Blood sugars covered with SSI.

## 2019-07-24 NOTE — Progress Notes (Signed)
Patient ID: [name] [name], female   DOB: 1981-11-09, 38 y.o.   MRN: [id number]  [facility] PROGRESS NOTE  [name] [name] KYH:[id number] DOB: Apr 12, 1981 DOA: 07/22/2019 PCP: [name] Lick, NP  HPI/Subjective: Patient still not feeling well.  Still having abdominal pain all over.  Nausea.  No vomiting since yesterday afternoon.  No diarrhea.  Objective: Vitals:   07/24/19 0845 07/24/19 1005  BP: (!) 197/95 109/64  Pulse: 85 80  Resp:  16  Temp:    SpO2:  97%    Intake/Output Summary (Last 24 hours) at 07/24/2019 1337 Last data filed at 07/24/2019 0951 Gross per 24 hour  Intake 3045.92 ml  Output -  Net 3045.92 ml   Filed Weights   07/22/19 2051 07/23/19 0116  Weight: 117.9 kg 114 kg    ROS: Review of Systems  Constitutional: Negative for chills and fever.  Eyes: Negative for blurred vision.  Respiratory: Negative for cough, shortness of breath and wheezing.   Cardiovascular: Negative for chest pain.  Gastrointestinal: Positive for abdominal pain and nausea. Negative for constipation, diarrhea and vomiting.  Genitourinary: Negative for dysuria.  Musculoskeletal: Negative for joint pain.  Neurological: Negative for dizziness and headaches.   Exam: Physical Exam  Constitutional: She is oriented to person, place, and time.  HENT:  Nose: No mucosal edema.  Mouth/Throat: No oropharyngeal exudate or posterior oropharyngeal edema.  Eyes: Pupils are equal, round, and reactive to light. Conjunctivae, EOM and lids are normal.  Neck: No JVD present. Carotid bruit is not present. No edema present. No thyroid mass and no thyromegaly present.  Cardiovascular: S1 normal and S2 normal. Exam reveals no gallop.  No murmur heard. Pulses:      Dorsalis pedis pulses are 2+ on the right side and 2+ on the left side.  Respiratory: No respiratory distress. She has no wheezes. She has no rhonchi. She has no rales.  GI: Soft. Bowel sounds are normal. There is abdominal tenderness.   Musculoskeletal:     Right ankle: She exhibits no swelling.     Left ankle: She exhibits no swelling.  Lymphadenopathy:    She has no cervical adenopathy.  Neurological: She is alert and oriented to person, place, and time. No cranial nerve deficit.  Skin: Skin is warm. No rash noted. Nails show no clubbing.  Psychiatric: She has a normal mood and affect.      Data Reviewed: Basic Metabolic Panel: Recent Labs  Lab 07/22/19 2054 07/23/19 0528 07/24/19 0545  NA 136 139 136  K 4.4 5.0 4.0  CL 104 108 106  CO2 19* 22 20*  GLUCOSE 394* 399* 280*  BUN 27* 25* 18  CREATININE 2.23* 1.71* 1.36*  CALCIUM 9.0 8.8* 8.4*   Liver Function Tests: Recent Labs  Lab 07/22/19 2054  AST 19  ALT 21  ALKPHOS 86  BILITOT 0.6  PROT 7.5  ALBUMIN 3.3*   Recent Labs  Lab 07/22/19 2054  LIPASE 20   CBC: Recent Labs  Lab 07/22/19 2054 07/23/19 0528  WBC 8.3 10.4  HGB 12.3 11.6*  HCT 38.7 37.8  MCV 82.5 83.4  PLT 496* 449*   BNP (last 3 results) Recent Labs    04/16/19 2035  BNP 50.0    CBG: Recent Labs  Lab 07/23/19 2211 07/24/19 0002 07/24/19 0349 07/24/19 0716 07/24/19 1203  GLUCAP 132* 176* 239* 247* 163*    Recent Results (from the past 240 hour(s))  SARS CORONAVIRUS 2 Nasal Swab Aptima Multi Swab  Status: None   Collection Time: 07/22/19 10:48 PM   Specimen: Aptima Multi Swab; Nasal Swab  Result Value Ref Range Status   SARS Coronavirus 2 NEGATIVE NEGATIVE Final    Comment: (NOTE) SARS-CoV-2 target nucleic acids are NOT DETECTED. The SARS-CoV-2 RNA is generally detectable in upper and lower respiratory specimens during the acute phase of infection. Negative results do not preclude SARS-CoV-2 infection, do not rule out co-infections with other pathogens, and should not be used as the sole basis for treatment or other patient management decisions. Negative results must be combined with clinical observations, patient history, and epidemiological  information. The expected result is Negative. Fact Sheet for Patients: SugarRoll.be Fact Sheet for Healthcare Providers: https://www.woods-mathews.com/ This test is not yet approved or cleared by the [place] FDA and  has been authorized for detection and/or diagnosis of SARS-CoV-2 by FDA under an Emergency Use Authorization (EUA). This EUA will remain  in effect (meaning this test can be used) for the duration of the COVID-19 declaration under Section 56 4(b)(1) of the Act, 21 U.S.C. section 360bbb-3(b)(1), unless the authorization is terminated or revoked sooner. Performed at [facility], [place] [address] [place], [place] [address]   MRSA PCR Screening     Status: None   Collection Time: 07/23/19  7:57 AM   Specimen: Nasopharyngeal  Result Value Ref Range Status   MRSA by PCR NEGATIVE NEGATIVE Final    Comment:        The GeneXpert MRSA Assay (FDA approved for NASAL specimens only), is one component of a comprehensive MRSA colonization surveillance program. It is not intended to diagnose MRSA infection nor to guide or monitor treatment for MRSA infections. Performed at [facility], [address] [place], [place] [address]      Studies: Ct Abdomen Wo Contrast  Result Date: 07/23/2019 CLINICAL DATA:  38 year old female with abdominal pain. Gastroparesis and chronic abdominal pain. EXAM: CT ABDOMEN WITHOUT CONTRAST TECHNIQUE: Multidetector CT imaging of the abdomen was performed following the standard protocol without IV contrast. COMPARISON:  CT of the abdomen pelvis dated 06/19/2019 FINDINGS: Evaluation of this exam is limited in the absence of intravenous contrast. Lower chest: The visualized lung bases are clear. There is coronary vascular calcification. There is hypoattenuation of the cardiac blood pool suggestive of a degree of anemia. Clinical correlation is recommended. No free air or free fluid noted in the  abdomen. Hepatobiliary: There is mild fatty infiltration of the liver. No intrahepatic biliary ductal dilatation. The gallbladder is unremarkable. Pancreas: Unremarkable. No pancreatic ductal dilatation or surrounding inflammatory changes. Spleen: Normal in size without focal abnormality. Adrenals/Urinary Tract: The adrenal glands are unremarkable. There is no hydronephrosis or nephrolithiasis on either side. The visualized ureters appear unremarkable. Stomach/Bowel: Oral contrast is noted the stomach. The stomach is unremarkable. There is no bowel dilatation the visualized abdomen. No inflammatory changes of the bowel noted. Vascular/Lymphatic: Mild atherosclerotic calcification of the aorta. There is mild haziness of the fat surrounding celiac axis and SMA similar to prior CT, nonspecific. No portal venous gas. There is no adenopathy. Other: None Musculoskeletal: No acute or significant osseous findings. IMPRESSION: 1. No acute intra-abdominal pathology. 2. Mild fatty liver. 3. Coronary vascular calcification and aortic Atherosclerosis (ICD10-I70.0). Electronically Signed   By: Anner Crete M.D.   On: 07/23/2019 00:51    Scheduled Meds: . amitriptyline  25 mg Oral QHS  . aspirin EC  81 mg Oral Daily  . clopidogrel  75 mg Oral Daily  .  DULoxetine  60 mg Oral Daily  . heparin injection (subcutaneous)  5,000 Units Subcutaneous Q8H  . insulin aspart  0-20 Units Subcutaneous TID WC  . insulin aspart  0-5 Units Subcutaneous QHS  . insulin aspart  10 Units Subcutaneous TID WC  . insulin glargine  10 Units Subcutaneous Daily  . isosorbide mononitrate  60 mg Oral Daily  . metoCLOPramide (REGLAN) injection  10 mg Intravenous Q6H  . metoprolol tartrate  100 mg Oral BID  . mirtazapine  60 mg Oral Daily  . pantoprazole  40 mg Oral BID   Continuous Infusions: . sodium chloride 100 mL/hr at 07/24/19 1001    Assessment/Plan:  1. Abdominal pain with nausea vomiting.  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  Patient does also have a history of diabetic gastroparesis.  Continue supportive care with IV Reglan PRN nausea medications.  Patient on liquid diet.  Patient did not want to advance her diet yet today.  Looking back at previous hospitalization had large work-up for her gallbladder.  Case discussed with Dr. Dahlia Byes surgery who saw her last time and he was not convinced that this was the gallbladder even though her HIDA scan was not completely normal. 2. Acute kidney injury on chronic kidney disease stage III.  Patient receiving IV fluids.  Creatinine actually improved down to 1.36. 3. 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  Continue Lantus and sliding scale. 4. Hypertension on metoprolol and Imdur. 5. GERD on Protonix 6. Depression on amitriptyline and Cymbalta  Code Status:     Code Status Orders  (From admission, onward)         Start     Ordered   07/22/19 2339  Full code  Continuous     07/22/19 2338        Code Status History    Date Active Date Inactive Code Status Order ID Comments User Context   06/18/2019 1206 06/22/2019 1522 Full Code [id number]  Loletha Grayer, MD ED   01/12/2019 1023 01/13/2019 1705 Full Code [id number]  [name], MD Inpatient   12/04/2018 1117 12/05/2018 1817 Full Code [id number]  Wellington Hampshire, MD Inpatient   05/02/2018 0455 05/04/2018 1433 Full Code [id number]  Arta Silence, MD ED   01/22/2018 0103 01/24/2018 1711 Full Code [id number]  Phillips Grout, MD ED   08/03/2017 2031 08/04/2017 2006 Full Code [id number]  Dustin Flock, MD Inpatient   08/05/2016 0832 08/08/2016 2008 Full Code [id number]  Clayburn Pert, MD Inpatient   03/01/2016 0612 03/01/2016 1954 Full Code [id number]  Harrie Foreman, MD Inpatient   08/21/2015 1736 08/22/2015 2203 Full Code [id number]  Dustin Flock, MD ED   07/22/2015 1105 07/23/2015 1832 Full Code [id number]  Epifanio Lesches, MD  Inpatient   05/05/2015 1059 05/05/2015 2055 Full Code [id number]  [place], Peter M, MD Inpatient   05/05/2015 [phone] 05/05/2015 1059 Full Code [id number]  Rise Patience, MD Inpatient   Advance Care Planning Activity     Disposition Plan: To be determined  Time spent: 28 minutes  [place]

## 2019-07-24 NOTE — Plan of Care (Signed)

## 2019-07-25 LAB — BASIC METABOLIC PANEL
Anion gap: 6 (ref 5–15)
BUN: 18 mg/dL (ref 6–20)
CO2: 21 mmol/L — ABNORMAL LOW (ref 22–32)
Calcium: 8.1 mg/dL — ABNORMAL LOW (ref 8.9–10.3)
Chloride: 111 mmol/L (ref 98–111)
Creatinine, Ser: 1.86 mg/dL — ABNORMAL HIGH (ref 0.44–1.00)
GFR calc Af Amer: 39 mL/min — ABNORMAL LOW (ref >60)
GFR calc non Af Amer: 33 mL/min — ABNORMAL LOW (ref >60)
Glucose, Bld: 170 mg/dL — ABNORMAL HIGH (ref 70–99)
Potassium: 4 mmol/L (ref 3.5–5.1)
Sodium: 138 mmol/L (ref 135–145)

## 2019-07-25 LAB — GLUCOSE, CAPILLARY
Glucose-Capillary: 112 mg/dL — ABNORMAL HIGH (ref 70–99)
Glucose-Capillary: 113 mg/dL — ABNORMAL HIGH (ref 70–99)
Glucose-Capillary: 126 mg/dL — ABNORMAL HIGH (ref 70–99)
Glucose-Capillary: 158 mg/dL — ABNORMAL HIGH (ref 70–99)
Glucose-Capillary: 221 mg/dL — ABNORMAL HIGH (ref 70–99)

## 2019-07-25 MED ORDER — BASAGLAR KWIKPEN 100 UNIT/ML ~~LOC~~ SOPN: 30.0000 [IU] | PEN_INJECTOR | Freq: Every day | SUBCUTANEOUS | 0 refills | Status: DC

## 2019-07-25 MED ORDER — CLOPIDOGREL BISULFATE 75 MG PO TABS: 75.0000 mg | ORAL_TABLET | Freq: Every day | ORAL | Status: DC

## 2019-07-25 MED ORDER — PROMETHAZINE HCL 12.5 MG PO TABS: 12.5000 mg | ORAL_TABLET | Freq: Four times a day (QID) | ORAL | 0 refills | Status: DC | PRN

## 2019-07-25 MED ORDER — MORPHINE SULFATE (PF) 2 MG/ML IV SOLN
2.0000 mg | Freq: Once | INTRAVENOUS | Status: AC
  Administered 2019-07-25: 18:00:00 2 mg via INTRAVENOUS
  Filled 2019-07-25: qty 1

## 2019-07-25 MED ORDER — MIRTAZAPINE 30 MG PO TABS: 60.0000 mg | ORAL_TABLET | Freq: Every day | ORAL | Status: DC

## 2019-07-25 MED ORDER — LACTULOSE 10 GM/15ML PO SOLN
30.0000 g | Freq: Once | ORAL | Status: AC
  Administered 2019-07-25: 14:00:00 30 g via ORAL
  Filled 2019-07-25: qty 60

## 2019-07-25 MED ORDER — METOCLOPRAMIDE HCL 10 MG PO TABS: 10.0000 mg | ORAL_TABLET | Freq: Three times a day (TID) | ORAL | 0 refills | Status: DC

## 2019-07-25 MED ORDER — PROMETHAZINE HCL 25 MG/ML IJ SOLN
12.5000 mg | Freq: Once | INTRAMUSCULAR | Status: AC | PRN
  Administered 2019-07-25: 21:00:00 12.5 mg via INTRAVENOUS
  Filled 2019-07-25: qty 1

## 2019-07-25 MED ORDER — MORPHINE SULFATE (PF) 2 MG/ML IV SOLN
2.0000 mg | Freq: Once | INTRAVENOUS | Status: AC
  Administered 2019-07-25: 11:00:00 2 mg via INTRAVENOUS
  Filled 2019-07-25: qty 1

## 2019-07-25 NOTE — Progress Notes (Addendum)
Pt reports no abdominal pain/n/v at shift beginning. MD ordered diet with pt reports onset of severe abdominal pain with n/v promptly after eating. Zofran po given while IV access being reestablised; morphine IV given when IV access available  MD notified with additional morphine required to control pain. Zofran, compazine given to recontrol nausea/vomiting. Pt reports 1 small loose stool and feels like she is constipated-lactulose given with most of miralax taken. IVF"s continued. Pt has had no po intake since breakfast this morning. Sitting up rocking in bed with grimaces/frowning expressions, but now currently quieter/calmer with more restful appearance.

## 2019-07-25 NOTE — Progress Notes (Signed)
Patient ID: [name] [name], female   DOB: [birth year], 38 y.o.   MRN: [id number]  [facility] PROGRESS NOTE  [name] [name] TMA:[id number] DOB: 12/27/1980 DOA: 07/22/2019 PCP: Venita Lick, NP  HPI/Subjective: Patient seen this morning and was feeling better and wanted to try diet and thought she may be able to go home today.  After breakfast she developed nausea and abdominal pain.  Requiring multiple rounds of medication.  Objective: Vitals:   07/25/19 0427 07/25/19 0932  BP: 126/62 (!) 163/90  Pulse: 76 87  Resp: 16 20  Temp: 98.7 F (37.1 C) 98.1 F (36.7 C)  SpO2: 98% 99%    Filed Weights   07/22/19 2051 07/23/19 0116  Weight: 117.9 kg 114 kg    ROS: Review of Systems  Constitutional: Negative for chills and fever.  Eyes: Negative for blurred vision.  Respiratory: Negative for cough, shortness of breath and wheezing.   Cardiovascular: Negative for chest pain.  Gastrointestinal: Negative for abdominal pain, constipation, diarrhea, nausea and vomiting.  Genitourinary: Negative for dysuria.  Musculoskeletal: Negative for joint pain.  Neurological: Negative for dizziness and headaches.   Exam: Physical Exam  Constitutional: She is oriented to person, place, and time.  HENT:  Nose: No mucosal edema.  Mouth/Throat: No oropharyngeal exudate or posterior oropharyngeal edema.  Eyes: Pupils are equal, round, and reactive to light. Conjunctivae, EOM and lids are normal.  Neck: No JVD present. Carotid bruit is not present. No edema present. No thyroid mass and no thyromegaly present.  Cardiovascular: S1 normal and S2 normal. Exam reveals no gallop.  No murmur heard. Pulses:      Dorsalis pedis pulses are 2+ on the right side and 2+ on the left side.  Respiratory: No respiratory distress. She has no wheezes. She has no rhonchi. She has no rales.  GI: Soft. Bowel sounds are normal. There is no abdominal tenderness.  Musculoskeletal:     Right ankle: She exhibits no  swelling.     Left ankle: She exhibits no swelling.  Lymphadenopathy:    She has no cervical adenopathy.  Neurological: She is alert and oriented to person, place, and time. No cranial nerve deficit.  Skin: Skin is warm. No rash noted. Nails show no clubbing.  Psychiatric: She has a normal mood and affect.      Data Reviewed: Basic Metabolic Panel: Recent Labs  Lab 07/22/19 2054 07/23/19 0528 07/24/19 0545 07/25/19 0550  NA 136 139 136 138  K 4.4 5.0 4.0 4.0  CL 104 108 106 111  CO2 19* 22 20* 21*  GLUCOSE 394* 399* 280* 170*  BUN 27* 25* 18 18  CREATININE 2.23* 1.71* 1.36* 1.86*  CALCIUM 9.0 8.8* 8.4* 8.1*   Liver Function Tests: Recent Labs  Lab 07/22/19 2054  AST 19  ALT 21  ALKPHOS 86  BILITOT 0.6  PROT 7.5  ALBUMIN 3.3*   Recent Labs  Lab 07/22/19 2054  LIPASE 20   CBC: Recent Labs  Lab 07/22/19 2054 07/23/19 0528  WBC 8.3 10.4  HGB 12.3 11.6*  HCT 38.7 37.8  MCV 82.5 83.4  PLT 496* 449*   BNP (last 3 results) Recent Labs    04/16/19 2035  BNP 50.0    CBG: Recent Labs  Lab 07/24/19 1642 07/24/19 1947 07/24/19 2358 07/25/19 0802 07/25/19 1136  GLUCAP 82 175* 144* 158* 221*    Recent Results (from the past 240 hour(s))  SARS CORONAVIRUS 2 Nasal Swab Aptima Multi Swab     Status:  None   Collection Time: 07/22/19 10:48 PM   Specimen: Aptima Multi Swab; Nasal Swab  Result Value Ref Range Status   SARS Coronavirus 2 NEGATIVE NEGATIVE Final    Comment: (NOTE) SARS-CoV-2 target nucleic acids are NOT DETECTED. The SARS-CoV-2 RNA is generally detectable in upper and lower respiratory specimens during the acute phase of infection. Negative results do not preclude SARS-CoV-2 infection, do not rule out co-infections with other pathogens, and should not be used as the sole basis for treatment or other patient management decisions. Negative results must be combined with clinical observations, patient history, and epidemiological  information. The expected result is Negative. Fact Sheet for Patients: SugarRoll.be Fact Sheet for Healthcare Providers: https://www.woods-mathews.com/ This test is not yet approved or cleared by the [place] FDA and  has been authorized for detection and/or diagnosis of SARS-CoV-2 by FDA under an Emergency Use Authorization (EUA). This EUA will remain  in effect (meaning this test can be used) for the duration of the COVID-19 declaration under Section 56 4(b)(1) of the Act, 21 U.S.C. section 360bbb-3(b)(1), unless the authorization is terminated or revoked sooner. Performed at [facility], [place] [address] [place], [place] [address]   MRSA PCR Screening     Status: None   Collection Time: 07/23/19  7:57 AM   Specimen: Nasopharyngeal  Result Value Ref Range Status   MRSA by PCR NEGATIVE NEGATIVE Final    Comment:        The GeneXpert MRSA Assay (FDA approved for NASAL specimens only), is one component of a comprehensive MRSA colonization surveillance program. It is not intended to diagnose MRSA infection nor to guide or monitor treatment for MRSA infections. Performed at [facility], [address] [place], [place] [address]      Scheduled Meds: . amitriptyline  25 mg Oral QHS  . aspirin EC  81 mg Oral Daily  . clopidogrel  75 mg Oral Daily  . DULoxetine  60 mg Oral Daily  . heparin injection (subcutaneous)  5,000 Units Subcutaneous Q8H  . insulin aspart  0-20 Units Subcutaneous TID WC  . insulin aspart  0-5 Units Subcutaneous QHS  . insulin aspart  10 Units Subcutaneous TID WC  . insulin glargine  10 Units Subcutaneous Daily  . isosorbide mononitrate  60 mg Oral Daily  . metoCLOPramide (REGLAN) injection  10 mg Intravenous Q6H  . metoprolol tartrate  100 mg Oral BID  . mirtazapine  60 mg Oral Daily  . pantoprazole  40 mg Oral BID  . polyethylene glycol  17 g Oral Daily   Continuous Infusions: .  sodium chloride 100 mL/hr at 07/25/19 1300    Assessment/Plan:  1. Abdominal pain with nausea vomiting.  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  Patient does also have a history of diabetic gastroparesis.  Continue supportive care with IV Reglan PRN nausea medications.  Patient on liquid diet.  Patient did not want to advance her diet yet today.  Looking back at previous hospitalization had large work-up for her gallbladder.  Case discussed with Dr. Dahlia Byes surgery yesterday and he did not believe that this was the gallbladder. 2. Acute kidney injury on chronic kidney disease stage III.  Patient receiving IV fluids.  Creatinine up today to 1.8.  Continue IV fluids and recheck tomorrow 3. 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  Continue Lantus and sliding scale. 4. Hypertension on metoprolol and Imdur. 5.  GERD on Protonix 6. Depression on amitriptyline and Cymbalta  Code Status:     Code Status Orders  (From admission, onward)         Start     Ordered   07/22/19 2339  Full code  Continuous     07/22/19 2338        Code Status History    Date Active Date Inactive Code Status Order ID Comments User Context   06/18/2019 1206 06/22/2019 1522 Full Code [id number]  Loletha Grayer, MD ED   01/12/2019 1023 01/13/2019 1705 Full Code [id number]  [name], MD Inpatient   12/04/2018 1117 12/05/2018 1817 Full Code [id number]  Wellington Hampshire, MD Inpatient   05/02/2018 0455 05/04/2018 1433 Full Code [id number]  Arta Silence, MD ED   01/22/2018 0103 01/24/2018 1711 Full Code [id number]  Phillips Grout, MD ED   08/03/2017 2031 08/04/2017 2006 Full Code [id number]  Dustin Flock, MD Inpatient   08/05/2016 0832 08/08/2016 2008 Full Code [id number]  Clayburn Pert, MD Inpatient   03/01/2016 0612 03/01/2016 1954 Full Code [id number]  Harrie Foreman, MD Inpatient   08/21/2015 1736 08/22/2015 2203 Full Code [id number]   Dustin Flock, MD ED   07/22/2015 1105 07/23/2015 1832 Full Code [id number]  Epifanio Lesches, MD Inpatient   05/05/2015 1059 05/05/2015 2055 Full Code [id number]  [place], Peter M, MD Inpatient   05/05/2015 [phone] 05/05/2015 1059 Full Code [id number]  Rise Patience, MD Inpatient   Advance Care Planning Activity     Disposition Plan: Evaluate every day to see how the patient is doing.  Time spent: 28 minutes  [employer]

## 2019-07-25 NOTE — Plan of Care (Signed)

## 2019-07-25 NOTE — Discharge Instructions (Signed)
Gastroparesis ° °Gastroparesis is a condition in which food takes longer than normal to empty from the stomach. The condition is usually long-lasting (chronic). It may also be called delayed gastric emptying. °There is no cure, but there are treatments and things that you can do at home to help relieve symptoms. Treating the underlying condition that causes gastroparesis can also help relieve symptoms. °What are the causes? °In many cases, the cause of this condition is not known. Possible causes include: °· A hormone (endocrine) disorder, such as hypothyroidism or diabetes. °· A nervous system disease, such as Parkinson's disease or multiple sclerosis. °· Cancer, infection, or surgery that affects the stomach or vagus nerve. The vagus nerve runs from your chest, through your neck, to the lower part of your brain. °· A connective tissue disorder, such as scleroderma. °· Certain medicines. °What increases the risk? °You are more likely to develop this condition if you: °· Have certain disorders or diseases, including: °? An endocrine disorder. °? An eating disorder. °? Amyloidosis. °? Scleroderma. °? Parkinson's disease. °? Multiple sclerosis. °? Cancer or infection of the stomach or the vagus nerve. °· Have had surgery on the stomach or vagus nerve. °· Take certain medicines. °· Are female. °What are the signs or symptoms? °Symptoms of this condition include: °· Feeling full after eating very little. °· Nausea. °· Vomiting. °· Heartburn. °· Abdominal bloating. °· Inconsistent blood sugar (glucose) levels on blood tests. °· Lack of appetite. °· Weight loss. °· Acid from the stomach coming up into the esophagus (gastroesophageal reflux). °· Sudden tightening (spasm) of the stomach, which can be painful. °Symptoms may come and go. Some people may not notice any symptoms. °How is this diagnosed? °This condition is diagnosed with tests, such as: °· Tests that check how long it takes food to move through the stomach and  intestines. These tests include: °? Upper gastrointestinal (GI) series. For this test, you drink a liquid that shows up well on X-rays, and then X-rays will be taken of your intestines. °? Gastric emptying scintigraphy. For this test, you eat food that contains a small amount of radioactive material, and then scans are taken. °? Wireless capsule GI monitoring system. For this test, you swallow a pill (capsule) that records information about how foods and fluid move through your stomach. °· Gastric manometry. For this test, a tube is passed down your throat and into your stomach to measure electrical and muscular activity. °· Endoscopy. For this test, a long, thin tube is passed down your throat and into your stomach to check for problems in your stomach lining. °· Ultrasound. This test uses sound waves to create images of inside the body. This can help rule out gallbladder disease or pancreatitis as a cause of your symptoms. °How is this treated? °There is no cure for gastroparesis. Treatment may include: °· Treating the underlying cause. °· Managing your symptoms by making changes to your diet and exercise habits. °· Taking medicines to control nausea and vomiting and to stimulate stomach muscles. °· Getting food through a feeding tube in the hospital. This may be done in severe cases. °· Having surgery to insert a device into your body that helps improve stomach emptying and control nausea and vomiting (gastric neurostimulator). °Follow these instructions at home: °· Take over-the-counter and prescription medicines only as told by your health care provider. °· Follow instructions from your health care provider about eating or drinking restrictions. Your health care provider may recommend that you: °? Eat   smaller meals more often. °? Eat low-fat foods. °? Eat low-fiber forms of high-fiber foods. For example, eat cooked vegetables instead of raw vegetables. °? Have only liquid foods instead of solid foods. Liquid  foods are easier to digest. °· Drink enough fluid to keep your urine pale yellow. °· Exercise as often as told by your health care provider. °· Keep all follow-up visits as told by your health care provider. This is important. °Contact a health care provider if you: °· Notice that your symptoms do not improve with treatment. °· Have new symptoms. °Get help right away if you: °· Have severe abdominal pain that does not improve with treatment. °· Have nausea that is severe or does not go away. °· Cannot drink fluids without vomiting. °Summary °· Gastroparesis is a chronic condition in which food takes longer than normal to empty from the stomach. °· Symptoms include nausea, vomiting, heartburn, abdominal bloating, and loss of appetite. °· Eating smaller portions, and low-fat, low-fiber foods may help you manage your symptoms. °· Get help right away if you have severe abdominal pain. °This information is not intended to replace advice given to you by your health care provider. Make sure you discuss any questions you have with your health care provider. °Document Released: 11/25/2005 Document Revised: 02/23/2018 Document Reviewed: 09/30/2017 °Elsevier Patient Education © 2020 [employer] ° °

## 2019-07-25 NOTE — Progress Notes (Signed)
Pt reports ongoing abdominal pain 8/10 with nausea. Compazine IV repeated; no current emesis. MD notified with morphine 2 mg IV x 1 dose given. Pt advised can have phenergan x 1 dose if needed. Will continue to assess.

## 2019-07-26 LAB — CBC
HCT: 35.4 % — ABNORMAL LOW (ref 36.0–46.0)
Hemoglobin: 10.9 g/dL — ABNORMAL LOW (ref 12.0–15.0)
MCH: 26.3 pg (ref 26.0–34.0)
MCHC: 30.8 g/dL (ref 30.0–36.0)
MCV: 85.5 fL (ref 80.0–100.0)
Platelets: 413 10*3/uL — ABNORMAL HIGH (ref 150–400)
RBC: 4.14 MIL/uL (ref 3.87–5.11)
RDW: 14.6 % (ref 11.5–15.5)
WBC: 8.3 10*3/uL (ref 4.0–10.5)
nRBC: 0 % (ref 0.0–0.2)

## 2019-07-26 LAB — GLUCOSE, CAPILLARY
Glucose-Capillary: 111 mg/dL — ABNORMAL HIGH (ref 70–99)
Glucose-Capillary: 96 mg/dL (ref 70–99)
Glucose-Capillary: 132 mg/dL — ABNORMAL HIGH (ref 70–99)
Glucose-Capillary: 111 mg/dL — ABNORMAL HIGH (ref 70–99)
Glucose-Capillary: 63 mg/dL — ABNORMAL LOW (ref 70–99)
Glucose-Capillary: 72 mg/dL (ref 70–99)

## 2019-07-26 LAB — BASIC METABOLIC PANEL
Anion gap: 13 (ref 5–15)
BUN: 11 mg/dL (ref 6–20)
CO2: 23 mmol/L (ref 22–32)
Calcium: 8.1 mg/dL — ABNORMAL LOW (ref 8.9–10.3)
Chloride: 101 mmol/L (ref 98–111)
Creatinine, Ser: 1.4 mg/dL — ABNORMAL HIGH (ref 0.44–1.00)
GFR calc Af Amer: 55 mL/min — ABNORMAL LOW (ref >60)
GFR calc non Af Amer: 47 mL/min — ABNORMAL LOW (ref >60)
Glucose, Bld: 131 mg/dL — ABNORMAL HIGH (ref 70–99)
Potassium: 3.5 mmol/L (ref 3.5–5.1)
Sodium: 137 mmol/L (ref 135–145)

## 2019-07-26 MED ORDER — PROMETHAZINE HCL 25 MG/ML IJ SOLN
12.5000 mg | Freq: Four times a day (QID) | INTRAMUSCULAR | Status: DC | PRN
  Administered 2019-07-27 (×2): 12.5 mg via INTRAVENOUS
  Filled 2019-07-26 (×2): qty 1

## 2019-07-26 MED ORDER — LABETALOL HCL 5 MG/ML IV SOLN
20.0000 mg | INTRAVENOUS | Status: DC | PRN
  Administered 2019-07-27: 20 mg via INTRAVENOUS
  Filled 2019-07-26: qty 4

## 2019-07-26 NOTE — Plan of Care (Signed)

## 2019-07-26 NOTE — Care Management Important Message (Signed)
Important Message  Patient Details  Name: [name] [name] MRN: [id number] Date of Birth: 05/16/1981   Medicare Important Message Given:  Yes     [name] 07/26/2019, 11:14 AM

## 2019-07-26 NOTE — Plan of Care (Signed)

## 2019-07-26 NOTE — Progress Notes (Signed)
Patient ID: [name] [name], female   DOB: 04-Oct-1981, 38 y.o.   MRN: [id number]  [facility] PROGRESS NOTE  [name] [name] ACZ:[id number] DOB: May 27, 1981 DOA: 07/22/2019 PCP: Venita Lick, NP  HPI/Subjective: Patient seen this morning and was feeling better and wanted to try diet and thought she may be able to go home today.  After breakfast she developed nausea and abdominal pain.  Requiring multiple rounds of medication.  Objective: Vitals:   07/26/19 0156 07/26/19 0407  BP: (!) 187/93 (!) 159/80  Pulse: 86 80  Resp:  16  Temp:  98.5 F (36.9 C)  SpO2:  97%    Filed Weights   07/22/19 2051 07/23/19 0116  Weight: 117.9 kg 114 kg    ROS: Review of Systems  Constitutional: Negative for chills and fever.  Eyes: Negative for blurred vision.  Respiratory: Negative for cough, shortness of breath and wheezing.   Cardiovascular: Negative for chest pain.  Gastrointestinal: Negative for abdominal pain, constipation, diarrhea, nausea and vomiting.  Genitourinary: Negative for dysuria.  Musculoskeletal: Negative for joint pain.  Neurological: Negative for dizziness and headaches.   Exam: Physical Exam  Constitutional: She is oriented to person, place, and time.  HENT:  Nose: No mucosal edema.  Mouth/Throat: No oropharyngeal exudate or posterior oropharyngeal edema.  Eyes: Pupils are equal, round, and reactive to light. Conjunctivae, EOM and lids are normal.  Neck: No JVD present. Carotid bruit is not present. No edema present. No thyroid mass and no thyromegaly present.  Cardiovascular: S1 normal and S2 normal. Exam reveals no gallop.  No murmur heard. Pulses:      Dorsalis pedis pulses are 2+ on the right side and 2+ on the left side.  Respiratory: No respiratory distress. She has no wheezes. She has no rhonchi. She has no rales.  GI: Soft. Bowel sounds are normal. There is abdominal tenderness in the right upper quadrant, epigastric area and left upper quadrant.   Musculoskeletal:     Right ankle: She exhibits no swelling.     Left ankle: She exhibits no swelling.  Lymphadenopathy:    She has no cervical adenopathy.  Neurological: She is alert and oriented to person, place, and time. No cranial nerve deficit.  Skin: Skin is warm. No rash noted. Nails show no clubbing.  Psychiatric: She has a normal mood and affect.      Data Reviewed: Basic Metabolic Panel: Recent Labs  Lab 07/22/19 2054 07/23/19 0528 07/24/19 0545 07/25/19 0550 07/26/19 0340  NA 136 139 136 138 137  K 4.4 5.0 4.0 4.0 3.5  CL 104 108 106 111 101  CO2 19* 22 20* 21* 23  GLUCOSE 394* 399* 280* 170* 131*  BUN 27* 25* 18 18 11   CREATININE 2.23* 1.71* 1.36* 1.86* 1.40*  CALCIUM 9.0 8.8* 8.4* 8.1* 8.1*   Liver Function Tests: Recent Labs  Lab 07/22/19 2054  AST 19  ALT 21  ALKPHOS 86  BILITOT 0.6  PROT 7.5  ALBUMIN 3.3*   Recent Labs  Lab 07/22/19 2054  LIPASE 20   CBC: Recent Labs  Lab 07/22/19 2054 07/23/19 0528  WBC 8.3 10.4  HGB 12.3 11.6*  HCT 38.7 37.8  MCV 82.5 83.4  PLT 496* 449*   BNP (last 3 results) Recent Labs    04/16/19 2035  BNP 50.0    CBG: Recent Labs  Lab 07/25/19 2054 07/25/19 2354 07/26/19 0406 07/26/19 0739 07/26/19 1159  GLUCAP 112* 113* 111* 96 132*    Recent Results (from  the past 240 hour(s))  SARS CORONAVIRUS 2 Nasal Swab Aptima Multi Swab     Status: None   Collection Time: 07/22/19 10:48 PM   Specimen: Aptima Multi Swab; Nasal Swab  Result Value Ref Range Status   SARS Coronavirus 2 NEGATIVE NEGATIVE Final    Comment: (NOTE) SARS-CoV-2 target nucleic acids are NOT DETECTED. The SARS-CoV-2 RNA is generally detectable in upper and lower respiratory specimens during the acute phase of infection. Negative results do not preclude SARS-CoV-2 infection, do not rule out co-infections with other pathogens, and should not be used as the sole basis for treatment or other patient management decisions. Negative  results must be combined with clinical observations, patient history, and epidemiological information. The expected result is Negative. Fact Sheet for Patients: SugarRoll.be Fact Sheet for Healthcare Providers: https://www.woods-mathews.com/ This test is not yet approved or cleared by the [place] FDA and  has been authorized for detection and/or diagnosis of SARS-CoV-2 by FDA under an Emergency Use Authorization (EUA). This EUA will remain  in effect (meaning this test can be used) for the duration of the COVID-19 declaration under Section 56 4(b)(1) of the Act, 21 U.S.C. section 360bbb-3(b)(1), unless the authorization is terminated or revoked sooner. Performed at [facility], [place] [address], [place], [place] [address]   MRSA PCR Screening     Status: None   Collection Time: 07/23/19  7:57 AM   Specimen: Nasopharyngeal  Result Value Ref Range Status   MRSA by PCR NEGATIVE NEGATIVE Final    Comment:        The GeneXpert MRSA Assay (FDA approved for NASAL specimens only), is one component of a comprehensive MRSA colonization surveillance program. It is not intended to diagnose MRSA infection nor to guide or monitor treatment for MRSA infections. Performed at [facility], [address] [place],  [address]      Scheduled Meds: . amitriptyline  25 mg Oral QHS  . aspirin EC  81 mg Oral Daily  . clopidogrel  75 mg Oral Daily  . DULoxetine  60 mg Oral Daily  . heparin injection (subcutaneous)  5,000 Units Subcutaneous Q8H  . insulin aspart  0-20 Units Subcutaneous TID WC  . insulin aspart  0-5 Units Subcutaneous QHS  . insulin aspart  10 Units Subcutaneous TID WC  . insulin glargine  10 Units Subcutaneous Daily  . isosorbide mononitrate  60 mg Oral Daily  . metoCLOPramide (REGLAN) injection  10 mg Intravenous Q6H  . metoprolol tartrate  100 mg Oral BID  . mirtazapine  60 mg Oral Daily  . pantoprazole   40 mg Oral BID  . polyethylene glycol  17 g Oral Daily   Continuous Infusions: . sodium chloride 50 mL/hr at 07/26/19 1030    Assessment/Plan:  1. Abdominal pain with nausea vomiting. Cannabis hyperemesis syndrome and diabetic gastroparesis contributing to symptoms.  Continue supportive care with IV Reglan PRN nausea medications.  We tried to advance diet yesterday and this did not go well.  Patient wants to stay on liquid diet as of this morning. 2. Acute kidney injury on chronic kidney disease stage III.  Patient receiving IV fluids.  Creatinine down today to 1.4.  Continue IV fluid hydration. 3. 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  Continue Lantus and sliding scale. 4. Hypertension on metoprolol and Imdur. 5. GERD on Protonix 6. Depression on amitriptyline and Cymbalta  Code Status:     Code Status Orders  (From admission,  onward)         Start     Ordered   07/22/19 2339  Full code  Continuous     07/22/19 2338        Code Status History    Date Active Date Inactive Code Status Order ID Comments User Context   06/18/2019 1206 06/22/2019 1522 Full Code [id number]  Loletha Grayer, MD ED   01/12/2019 1023 01/13/2019 1705 Full Code [id number]  [name], MD Inpatient   12/04/2018 1117 12/05/2018 1817 Full Code [id number]  Wellington Hampshire, MD Inpatient   05/02/2018 0455 05/04/2018 1433 Full Code [id number]  Arta Silence, MD ED   01/22/2018 0103 01/24/2018 1711 Full Code [id number]  Phillips Grout, MD ED   08/03/2017 2031 08/04/2017 2006 Full Code [id number]  Dustin Flock, MD Inpatient   08/05/2016 0832 08/08/2016 2008 Full Code [id number]  Clayburn Pert, MD Inpatient   03/01/2016 0612 03/01/2016 1954 Full Code [id number]  Harrie Foreman, MD Inpatient   08/21/2015 1736 08/22/2015 2203 Full Code [id number]  Dustin Flock, MD ED   07/22/2015 1105 07/23/2015 1832 Full Code [id number]  Epifanio Lesches, MD Inpatient   05/05/2015 1059 05/05/2015 2055  Full Code [id number]  Martinique, Peter M, MD Inpatient   05/05/2015 [phone] 05/05/2015 1059 Full Code [id number]  Rise Patience, MD Inpatient   Advance Care Planning Activity     Disposition Plan: I am hopeful every day that I go in there that she feels well enough to go home.  She must tolerate solid food prior to going home.  Time spent: 27 minutes  [place]

## 2019-07-27 LAB — GLUCOSE, CAPILLARY
Glucose-Capillary: 113 mg/dL — ABNORMAL HIGH (ref 70–99)
Glucose-Capillary: 121 mg/dL — ABNORMAL HIGH (ref 70–99)
Glucose-Capillary: 121 mg/dL — ABNORMAL HIGH (ref 70–99)
Glucose-Capillary: 124 mg/dL — ABNORMAL HIGH (ref 70–99)
Glucose-Capillary: 131 mg/dL — ABNORMAL HIGH (ref 70–99)
Glucose-Capillary: 89 mg/dL (ref 70–99)

## 2019-07-27 MED ORDER — MORPHINE SULFATE (PF) 2 MG/ML IV SOLN
2.0000 mg | Freq: Once | INTRAVENOUS | Status: AC
  Administered 2019-07-27: 15:00:00 2 mg via INTRAVENOUS
  Filled 2019-07-27: qty 1

## 2019-07-27 MED ORDER — LACTULOSE 10 GM/15ML PO SOLN: 30.0000 g | Freq: Two times a day (BID) | ORAL | Status: DC | PRN

## 2019-07-27 NOTE — Progress Notes (Signed)
Patient ID: [name] [name], female   DOB: 15-Nov-1981, 38 y.o.   MRN: [id number]   [facility] PROGRESS NOTE  [name] [name] RWE:[id number] DOB: 10-Sep-1981 DOA: 07/22/2019 PCP: Venita Lick, NP  HPI/Subjective: When I walked into the room the patient's head was over the garbage can.  She was having some abdominal pain.  Positive for nausea.  She states the MiraLAX had made her ill.  Objective: Vitals:   07/27/19 0445 07/27/19 0923  BP: 113/64 (!) 167/85  Pulse: 75 86  Resp: 17   Temp: 98.4 F (36.9 C)   SpO2: 100%     Filed Weights   07/22/19 2051 07/23/19 0116  Weight: 117.9 kg 114 kg    ROS: Review of Systems  Constitutional: Negative for chills and fever.  Eyes: Negative for blurred vision.  Respiratory: Negative for cough, shortness of breath and wheezing.   Cardiovascular: Negative for chest pain.  Gastrointestinal: Positive for abdominal pain and nausea. Negative for constipation, diarrhea and vomiting.  Genitourinary: Negative for dysuria.  Musculoskeletal: Negative for joint pain.  Neurological: Negative for dizziness and headaches.   Exam: Physical Exam  Constitutional: She is oriented to person, place, and time.  HENT:  Nose: No mucosal edema.  Mouth/Throat: No oropharyngeal exudate or posterior oropharyngeal edema.  Eyes: Pupils are equal, round, and reactive to light. Conjunctivae, EOM and lids are normal.  Neck: No JVD present. Carotid bruit is not present. No edema present. No thyroid mass and no thyromegaly present.  Cardiovascular: S1 normal and S2 normal. Exam reveals no gallop.  No murmur heard. Pulses:      Dorsalis pedis pulses are 2+ on the right side and 2+ on the left side.  Respiratory: No respiratory distress. She has no wheezes. She has no rhonchi. She has no rales.  GI: Soft. Bowel sounds are normal. There is abdominal tenderness in the right upper quadrant, epigastric area and left upper quadrant.  Musculoskeletal:     Right ankle:  She exhibits no swelling.     Left ankle: She exhibits no swelling.  Lymphadenopathy:    She has no cervical adenopathy.  Neurological: She is alert and oriented to person, place, and time. No cranial nerve deficit.  Skin: Skin is warm. No rash noted. Nails show no clubbing.  Psychiatric: She has a normal mood and affect.      Data Reviewed: Basic Metabolic Panel: Recent Labs  Lab 07/22/19 2054 07/23/19 0528 07/24/19 0545 07/25/19 0550 07/26/19 0340  NA 136 139 136 138 137  K 4.4 5.0 4.0 4.0 3.5  CL 104 108 106 111 101  CO2 19* 22 20* 21* 23  GLUCOSE 394* 399* 280* 170* 131*  BUN 27* 25* 18 18 11   CREATININE 2.23* 1.71* 1.36* 1.86* 1.40*  CALCIUM 9.0 8.8* 8.4* 8.1* 8.1*   Liver Function Tests: Recent Labs  Lab 07/22/19 2054  AST 19  ALT 21  ALKPHOS 86  BILITOT 0.6  PROT 7.5  ALBUMIN 3.3*   Recent Labs  Lab 07/22/19 2054  LIPASE 20   CBC: Recent Labs  Lab 07/22/19 2054 07/23/19 0528 07/26/19 0348  WBC 8.3 10.4 8.3  HGB 12.3 11.6* 10.9*  HCT 38.7 37.8 35.4*  MCV 82.5 83.4 85.5  PLT 496* 449* 413*   BNP (last 3 results) Recent Labs    04/16/19 2035  BNP 50.0    CBG: Recent Labs  Lab 07/26/19 2058 07/27/19 0032 07/27/19 0604 07/27/19 0717 07/27/19 1151  GLUCAP 72 121* 113* 131* 89  Recent Results (from the past 240 hour(s))  SARS CORONAVIRUS 2 Nasal Swab Aptima Multi Swab     Status: None   Collection Time: 07/22/19 10:48 PM   Specimen: Aptima Multi Swab; Nasal Swab  Result Value Ref Range Status   SARS Coronavirus 2 NEGATIVE NEGATIVE Final    Comment: (NOTE) SARS-CoV-2 target nucleic acids are NOT DETECTED. The SARS-CoV-2 RNA is generally detectable in upper and lower respiratory specimens during the acute phase of infection. Negative results do not preclude SARS-CoV-2 infection, do not rule out co-infections with other pathogens, and should not be used as the sole basis for treatment or other patient management  decisions. Negative results must be combined with clinical observations, patient history, and epidemiological information. The expected result is Negative. Fact Sheet for Patients: SugarRoll.be Fact Sheet for Healthcare Providers: https://www.woods-mathews.com/ This test is not yet approved or cleared by the [place] FDA and  has been authorized for detection and/or diagnosis of SARS-CoV-2 by FDA under an Emergency Use Authorization (EUA). This EUA will remain  in effect (meaning this test can be used) for the duration of the COVID-19 declaration under Section 56 4(b)(1) of the Act, 21 U.S.C. section 360bbb-3(b)(1), unless the authorization is terminated or revoked sooner. Performed at [facility], [place] [address], [place], [place] [address]   MRSA PCR Screening     Status: None   Collection Time: 07/23/19  7:57 AM   Specimen: Nasopharyngeal  Result Value Ref Range Status   MRSA by PCR NEGATIVE NEGATIVE Final    Comment:        The GeneXpert MRSA Assay (FDA approved for NASAL specimens only), is one component of a comprehensive MRSA colonization surveillance program. It is not intended to diagnose MRSA infection nor to guide or monitor treatment for MRSA infections. Performed at [facility], [place], [place] [address]      Scheduled Meds: . amitriptyline  25 mg Oral QHS  . aspirin EC  81 mg Oral Daily  . clopidogrel  75 mg Oral Daily  . DULoxetine  60 mg Oral Daily  . heparin injection (subcutaneous)  5,000 Units Subcutaneous Q8H  . insulin aspart  0-20 Units Subcutaneous TID WC  . insulin aspart  0-5 Units Subcutaneous QHS  . insulin aspart  10 Units Subcutaneous TID WC  . insulin glargine  10 Units Subcutaneous Daily  . isosorbide mononitrate  60 mg Oral Daily  . metoCLOPramide (REGLAN) injection  10 mg Intravenous Q6H  . metoprolol tartrate  100 mg Oral BID  . mirtazapine  60 mg Oral  Daily  .  morphine injection  2 mg Intravenous Once  . pantoprazole  40 mg Oral BID   Continuous Infusions: . sodium chloride 50 mL/hr at 07/27/19 0538    Assessment/Plan:  1. Abdominal pain with nausea vomiting. Cannabis hyperemesis syndrome and diabetic gastroparesis contributing to symptoms.  Continue supportive care with IV Reglan PRN nausea medications.  Patient states that the MiraLAX had made her ill today.  She still wants to try to eat advance diet but later today.  Would like to see her tolerate a few meals prior to going home.  Discontinue MiraLAX.  Lactulose as needed for constipation.. 2. Acute kidney injury on chronic kidney disease stage III.  Patient receiving IV fluids.  Creatinine improved to 1.40 yesterday 3. 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  Continue Lantus and sliding scale. 4. Hypertension on metoprolol and Imdur. 5. GERD  on Protonix 6. Depression on amitriptyline and Cymbalta  Code Status:     Code Status Orders  (From admission, onward)         Start     Ordered   07/22/19 2339  Full code  Continuous     07/22/19 2338        Code Status History    Date Active Date Inactive Code Status Order ID Comments User Context   06/18/2019 1206 06/22/2019 1522 Full Code [id number]  Loletha Grayer, MD ED   01/12/2019 1023 01/13/2019 1705 Full Code [id number]  [name], MD Inpatient   12/04/2018 1117 12/05/2018 1817 Full Code [id number]  Wellington Hampshire, MD Inpatient   05/02/2018 0455 05/04/2018 1433 Full Code [id number]  Arta Silence, MD ED   01/22/2018 0103 01/24/2018 1711 Full Code [id number]  Phillips Grout, MD ED   08/03/2017 2031 08/04/2017 2006 Full Code [id number]  Dustin Flock, MD Inpatient   08/05/2016 0832 08/08/2016 2008 Full Code [id number]  Clayburn Pert, MD Inpatient   03/01/2016 0612 03/01/2016 1954 Full Code [id number]  Harrie Foreman, MD Inpatient   08/21/2015 1736 08/22/2015 2203 Full Code [id number]  Dustin Flock, MD ED   07/22/2015 1105 07/23/2015 1832 Full Code [id number]  Epifanio Lesches, MD Inpatient   05/05/2015 1059 05/05/2015 2055 Full Code [id number]  Martinique, Peter M, MD Inpatient   05/05/2015 [phone] 05/05/2015 1059 Full Code [id number]  Rise Patience, MD Inpatient   Advance Care Planning Activity     Disposition Plan: Unclear at this time.  Time spent: 27 minutes  [place]

## 2019-07-28 LAB — GLUCOSE, CAPILLARY
Glucose-Capillary: 110 mg/dL — ABNORMAL HIGH (ref 70–99)
Glucose-Capillary: 118 mg/dL — ABNORMAL HIGH (ref 70–99)

## 2019-07-28 MED ORDER — SENNOSIDES-DOCUSATE SODIUM 8.6-50 MG PO TABS: 1.0000 | ORAL_TABLET | Freq: Two times a day (BID) | ORAL | Status: DC

## 2019-07-28 MED ORDER — SENNOSIDES-DOCUSATE SODIUM 8.6-50 MG PO TABS: 1.0000 | ORAL_TABLET | Freq: Two times a day (BID) | ORAL | 0 refills | Status: DC

## 2019-07-28 MED ORDER — PROMETHAZINE HCL 25 MG PO TABS
25.0000 mg | ORAL_TABLET | Freq: Once | ORAL | Status: AC
  Administered 2019-07-28: 25 mg via ORAL
  Filled 2019-07-28: qty 1

## 2019-07-28 MED ORDER — LACTULOSE 10 GM/15ML PO SOLN: 30.0000 g | Freq: Every day | ORAL | 0 refills | Status: DC | PRN

## 2019-07-28 NOTE — Discharge Summary (Signed)
[facility] NAME: Ashaunte Standley    MR#:  [id number]  DATE OF BIRTH:  Nov 25, 1981  DATE OF ADMISSION:  07/22/2019 ADMITTING PHYSICIAN: Christel Mormon, MD  DATE OF DISCHARGE: 07/28/2019 11:17 AM  PRIMARY CARE PHYSICIAN: Marnee Guarneri T, NP    ADMISSION DIAGNOSIS:  Gastroparesis [K31.84] Nausea and vomiting in adult [R11.2]  DISCHARGE DIAGNOSIS:  Active Problems:   AKI (acute kidney injury) ([place])   SECONDARY DIAGNOSIS:   Past Medical History:  Diagnosis Date  . CAD (coronary artery disease)    a. 03/2015 NSTEMI/PCI: LCX 115m (DES), OM1 100p (DES - vessel labeled Ramus in subsequent cath report). Minor irregs to LAD/RCA; b. 04/2015 Cath: LM nl, LAD 40p, RI patent stent, LCX patent stent, RCA nl, EF 55-65%; c. 02/2016 MV: basal inferolateral and mid inferolateral defect/infarct w/o ischemia. EF 55-65%; d. 11/2018 PCI: LM 30ost, LAD 30p, LCX 95p/m (PTCA->20%), OM1 10ost, OM2 40ost, RCA 85p.  Marland Kitchen Chronic abdominal pain   . Chronic diastolic (congestive) heart failure ([place])    a. 03/2015 Echo: EF 30-35%, mild concentric LVH, severe HK of inf, inflat, & lat walls, mod MR, mild TR;  b. 04/2015 LV Gram: EF 55-65%; c. 09/2017 Echo: EF 55-60%, no rwma. Mild MR; d. 11/2018 Echo: Ef 60-65%, no rwma.  . CKD (chronic kidney disease), stage III ([place])   . Diabetes type 2, controlled ([place])    a. since 2002   . Diabetic gastroparesis ([facility])    a. chronic nausea/vomiting.  . Diverticulosis, sigmoid 07/2016  . Heavy menses    a. H/O IUD - expired in 2014 - remains in place.  . Hyperlipidemia    Intolerant of atorvastatin, rosuvastatin  . Hypertension   . Iron deficiency anemia   . Ischemic cardiomyopathy (resolved)    a. 03/2015 EF 30-35% post NSTEMI;  b. 04/2015 EF 55-65% on LV gram; c. 09/2017 Echo: EF 55-60%; d. 11/2018 Echo: Ef 60-65%.  . Obesity   . Tobacco abuse    a. quit 03/2015.  Marland Kitchen Uterine fibroid   . Vertigo   . Vitamin D deficiency      HOSPITAL COURSE:   1.  Abdominal pain with nausea vomiting.  Cannabis hyperemesis syndrome, patient was advised to stop smoking marijuana.  Patient states that she stopped smoking about a month ago but still on the urine toxicology which can take about a month to come out of the system.  Diabetic gastroparesis likely contributing.  The patient also got sick yesterday secondary to MiraLAX so I discontinued this medication.  Lactulose will be given as needed for constipation and senna Colace combination as outpatient.  Patient tolerated dinner last night and breakfast this morning and was discharged home in stable condition. 2.  Acute kidney injury on chronic kidney disease stage III.  Patient received IV fluids during the entire hospital stay.  Creatinine improved down to 1.4 on disposition.  I held her ARB.  Consider restarting as outpatient. 3.  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  We gave low-dose Lantus and sliding scale here in the hospital.  Patient states that she takes Basaglar insulin 60 units daily.  At this point I think we can decrease it down to 30 units daily and do sliding scale.  Hold on oral medications. 4.  Hypertension on metoprolol and Imdur 5.  GERD on Protonix 6.  Depression on amitriptyline and Cymbalta  DISCHARGE CONDITIONS:   Satisfactory  CONSULTS OBTAINED:  None  DRUG ALLERGIES:   Allergies  Allergen Reactions  . Lisinopril Anaphylaxis, Itching and Swelling  . Rosemary Oil Anaphylaxis  . Shellfish Allergy Itching and Swelling  . Other Itching and Swelling    [place] seasoning  . Metformin And Related Diarrhea, Nausea And Vomiting and Rash    DISCHARGE MEDICATIONS:   Allergies as of 07/28/2019      Reactions   Lisinopril Anaphylaxis, Itching, Swelling   Rosemary Oil Anaphylaxis   Shellfish Allergy Itching, Swelling   Other Itching, Swelling   [place] seasoning   Metformin And Related Diarrhea, Nausea And Vomiting, Rash       Medication List    STOP taking these medications   Farxiga 10 MG Tabs tablet Generic drug: dapagliflozin propanediol   glimepiride 4 MG tablet Commonly known as: AMARYL   irbesartan 75 MG tablet Commonly known as: AVAPRO   loperamide 2 MG tablet Commonly known as: IMODIUM A-D   ondansetron 4 MG tablet Commonly known as: Zofran   polyethylene glycol 17 g packet Commonly known as: MIRALAX / GLYCOLAX   prochlorperazine 5 MG tablet Commonly known as: COMPAZINE   traMADol 50 MG tablet Commonly known as: Ultram     TAKE these medications   amitriptyline 25 MG tablet Commonly known as: ELAVIL Take 25 mg by mouth at bedtime. Notes to patient: Tonight, 07-28-2019 @ bedtime   aspirin EC 81 MG tablet Take 81 mg by mouth daily. Notes to patient: Tomorrow, 07-29-2019   Basaglar KwikPen 100 UNIT/ML Sopn Inject 0.3 mLs (30 Units total) into the skin daily.   Cholecalciferol 50 MCG (2000 UT) Tabs Take 2,000 Units by mouth daily.   clopidogrel 75 MG tablet Commonly known as: Plavix Take 1 tablet (75 mg total) by mouth daily. Notes to patient: Tomorrow, 07-29-2019   DULoxetine 30 MG capsule Commonly known as: CYMBALTA Take 60 mg by mouth daily. Notes to patient: Tomorrow, 07-29-2019   furosemide 40 MG tablet Commonly known as: LASIX Take 1 tablet (40 mg total) by mouth every other day.   insulin aspart 100 UNIT/ML injection Commonly known as: novoLOG Inject 10 Units into the skin 3 (three) times daily with meals.   isosorbide mononitrate 30 MG 24 hr tablet Commonly known as: IMDUR Take 2 tablets (60 mg total) by mouth daily. Notes to patient: Tomorrow, 07-29-2019   lactulose 10 GM/15ML solution Commonly known as: CHRONULAC Take 45 mLs (30 g total) by mouth daily as needed for severe constipation.   meclizine 25 MG tablet Commonly known as: ANTIVERT Take 25 mg by mouth 3 (three) times daily as needed for dizziness.   metoCLOPramide 10 MG tablet Commonly known  as: REGLAN Take 1 tablet (10 mg total) by mouth 4 (four) times daily -  before meals and at bedtime. What changed: when to take this   metoprolol tartrate 100 MG tablet Commonly known as: LOPRESSOR Take 1 tablet (100 mg total) by mouth 2 (two) times daily.   mirtazapine 30 MG tablet Commonly known as: REMERON Take 2 tablets (60 mg total) by mouth at bedtime. What changed: when to take this Notes to patient: Tonight, 07-28-2019   Nexplanon 68 MG Impl implant Generic drug: etonogestrel 1 each by Subdermal route once.   nitroGLYCERIN 0.4 MG SL tablet Commonly known as: NITROSTAT Take 1 tab under your tongue while sitting for chest pain, If no relief may repeat, one tab every 5 min up to 3 tabs total over 15 mins.   pantoprazole 40 MG  tablet Commonly known as: PROTONIX Take 1 tablet (40 mg total) by mouth 2 (two) times daily. Notes to patient: Tomorrow, 07-29-2019   promethazine 12.5 MG tablet Commonly known as: PHENERGAN Take 1 tablet (12.5 mg total) by mouth every 6 (six) hours as needed for nausea or vomiting.   senna-docusate 8.6-50 MG tablet Commonly known as: Senokot-S Take 1 tablet by mouth 2 (two) times daily. Start taking on: July 29, 2019        DISCHARGE INSTRUCTIONS:   Follow-up PMD 5 days  If you experience worsening of your admission symptoms, develop shortness of breath, life threatening emergency, suicidal or homicidal thoughts you must seek medical attention immediately by calling 911 or calling your MD immediately  if symptoms less severe.  You Must read complete instructions/literature along with all the possible adverse reactions/side effects for all the Medicines you take and that have been prescribed to you. Take any new Medicines after you have completely understood and accept all the possible adverse reactions/side effects.   Please note  You were cared for by a hospitalist during your hospital stay. If you have any questions about your discharge  medications or the care you received while you were in the hospital after you are discharged, you can call the unit and asked to speak with the hospitalist on call if the hospitalist that took care of you is not available. Once you are discharged, your primary care physician will handle any further medical issues. Please note that NO REFILLS for any discharge medications will be authorized once you are discharged, as it is imperative that you return to your primary care physician (or establish a relationship with a primary care physician if you do not have one) for your aftercare needs so that they can reassess your need for medications and monitor your lab values.    Today   CHIEF COMPLAINT:   Chief Complaint  Patient presents with  . Abdominal Pain  . Nausea  . Emesis    HISTORY OF PRESENT ILLNESS:  Mileydi Milsap  is a 38 y.o. female came in with nausea vomiting abdominal pain   VITAL SIGNS:  Blood pressure (!) 155/85, pulse 90, temperature 98.5 F (36.9 C), temperature source Oral, resp. rate 14, height 5\' 4"  (1.626 m), weight 114 kg, SpO2 98 %.  I/O:    Intake/Output Summary (Last 24 hours) at 07/28/2019 1414 Last data filed at 07/28/2019 0900 Gross per 24 hour  Intake 1516.15 ml  Output -  Net 1516.15 ml    PHYSICAL EXAMINATION:  GENERAL:  38 y.o.-year-old patient lying in the bed with no acute distress.  EYES: Pupils equal, round, reactive to light and accommodation. No scleral icterus. Extraocular muscles intact.  HEENT: Head atraumatic, normocephalic. Oropharynx and nasopharynx clear.  NECK:  Supple, no jugular venous distention. No thyroid enlargement, no tenderness.  LUNGS: Normal breath sounds bilaterally, no wheezing, rales,rhonchi or crepitation. No use of accessory muscles of respiration.  CARDIOVASCULAR: S1, S2 normal. No murmurs, rubs, or gallops.  ABDOMEN: Soft, tender, non-distended. Bowel sounds present. No organomegaly or mass.  EXTREMITIES: No pedal  edema, cyanosis, or clubbing.  NEUROLOGIC: Cranial nerves II through XII are intact. Muscle strength 5/5 in all extremities. Sensation intact. Gait not checked.  PSYCHIATRIC: The patient is alert and oriented x 3.  SKIN: No obvious rash, lesion, or ulcer.   DATA REVIEW:   CBC Recent Labs  Lab 07/26/19 0348  WBC 8.3  HGB 10.9*  HCT 35.4*  PLT 413*  Chemistries  Recent Labs  Lab 07/22/19 2054  07/26/19 0340  NA 136   < > 137  K 4.4   < > 3.5  CL 104   < > 101  CO2 19*   < > 23  GLUCOSE 394*   < > 131*  BUN 27*   < > 11  CREATININE 2.23*   < > 1.40*  CALCIUM 9.0   < > 8.1*  AST 19  --   --   ALT 21  --   --   ALKPHOS 86  --   --   BILITOT 0.6  --   --    < > = values in this interval not displayed.    Microbiology Results  Results for orders placed or performed during the hospital encounter of 07/22/19  SARS CORONAVIRUS 2 Nasal Swab Aptima Multi Swab     Status: None   Collection Time: 07/22/19 10:48 PM   Specimen: Aptima Multi Swab; Nasal Swab  Result Value Ref Range Status   SARS Coronavirus 2 NEGATIVE NEGATIVE Final    Comment: (NOTE) SARS-CoV-2 target nucleic acids are NOT DETECTED. The SARS-CoV-2 RNA is generally detectable in upper and lower respiratory specimens during the acute phase of infection. Negative results do not preclude SARS-CoV-2 infection, do not rule out co-infections with other pathogens, and should not be used as the sole basis for treatment or other patient management decisions. Negative results must be combined with clinical observations, patient history, and epidemiological information. The expected result is Negative. Fact Sheet for Patients: SugarRoll.be Fact Sheet for Healthcare Providers: https://www.woods-mathews.com/ This test is not yet approved or cleared by the [place] FDA and  has been authorized for detection and/or diagnosis of SARS-CoV-2 by FDA under an Emergency Use  Authorization (EUA). This EUA will remain  in effect (meaning this test can be used) for the duration of the COVID-19 declaration under Section 56 4(b)(1) of the Act, 21 U.S.C. section 360bbb-3(b)(1), unless the authorization is terminated or revoked sooner. Performed at [facility], [place] [address] [place], [place] [address]   MRSA PCR Screening     Status: None   Collection Time: 07/23/19  7:57 AM   Specimen: Nasopharyngeal  Result Value Ref Range Status   MRSA by PCR NEGATIVE NEGATIVE Final    Comment:        The GeneXpert MRSA Assay (FDA approved for NASAL specimens only), is one component of a comprehensive MRSA colonization surveillance program. It is not intended to diagnose MRSA infection nor to guide or monitor treatment for MRSA infections. Performed at [facility], [address] [place], [place] [address]     Management plans discussed with the patient, and she is in agreement.  CODE STATUS:     Code Status Orders  (From admission, onward)         Start     Ordered   07/22/19 2339  Full code  Continuous     07/22/19 2338        Code Status History    Date Active Date Inactive Code Status Order ID Comments User Context   06/18/2019 1206 06/22/2019 1522 Full Code [id number]  Loletha Grayer, MD ED   01/12/2019 1023 01/13/2019 1705 Full Code [id number]  [name], MD Inpatient   12/04/2018 1117 12/05/2018 1817 Full Code [id number]  Wellington Hampshire, MD Inpatient   05/02/2018 0455 05/04/2018 1433 Full Code [id number]  Arta Silence, MD ED   01/22/2018 0103 01/24/2018  [place] Full Code [id number]  Phillips Grout, MD ED   08/03/2017 2031 08/04/2017 2006 Full Code [id number]  Dustin Flock, MD Inpatient   08/05/2016 [phone] 08/08/2016 2008 Full Code [id number]  Clayburn Pert, MD Inpatient   03/01/2016 0612 03/01/2016 1954 Full Code [id number]  Harrie Foreman, MD Inpatient   08/21/2015 1736 08/22/2015 2203 Full Code [id number]  Dustin Flock, MD  ED   07/22/2015 1105 07/23/2015 1832 Full Code [id number]  Epifanio Lesches, MD Inpatient   05/05/2015 1059 05/05/2015 2055 Full Code [id number]  [place], Peter M, MD Inpatient   05/05/2015 6712 05/05/2015 1059 Full Code [id number]  Rise Patience, MD Inpatient   Advance Care Planning Activity      TOTAL TIME TAKING CARE OF THIS PATIENT: 32 minutes.    Loletha Grayer M.D on 07/28/2019 at 2:14 PM  Between 7am to 6pm - Pager - [phone]  After 6pm go to [email or web address] - password [email or web address] [facility]  [phone]  CC: Primary care physician; Venita Lick, NP

## 2019-07-29 ENCOUNTER — Telehealth: Payer: Self-pay

## 2019-07-29 LAB — GLUCOSE, CAPILLARY: Glucose-Capillary: 83 mg/dL (ref 70–99)

## 2019-07-29 NOTE — Telephone Encounter (Signed)
I have made the 1st attempt to contact the patient or family member in charge, in order to follow up from recently being discharged from the hospital. I left a message on voicemail but I will make another attempt at a different time.  

## 2019-07-29 NOTE — Telephone Encounter (Signed)
I have made the 2nd attempt to contact the patient or family member in charge, in order to follow up from recently being discharged from the hospital. I left a message on voicemail with her appt information.

## 2019-07-30 ENCOUNTER — Other Ambulatory Visit: Payer: Self-pay

## 2019-07-30 ENCOUNTER — Encounter: Payer: Self-pay | Admitting: Family Medicine

## 2019-07-30 ENCOUNTER — Ambulatory Visit (INDEPENDENT_AMBULATORY_CARE_PROVIDER_SITE_OTHER): Payer: Medicare Other | Admitting: Family Medicine

## 2019-07-30 VITALS — BP 132/83 | HR 92 | Temp 99.3°F | Ht 65.0 in | Wt 254.0 lb

## 2019-07-30 DIAGNOSIS — K3184 Gastroparesis: Secondary | ICD-10-CM

## 2019-07-30 DIAGNOSIS — E1143 Type 2 diabetes mellitus with diabetic autonomic (poly)neuropathy: Secondary | ICD-10-CM | POA: Diagnosis not present

## 2019-07-30 DIAGNOSIS — I130 Hypertensive heart and chronic kidney disease with heart failure and stage 1 through stage 4 chronic kidney disease, or unspecified chronic kidney disease: Secondary | ICD-10-CM

## 2019-07-30 DIAGNOSIS — E1165 Type 2 diabetes mellitus with hyperglycemia: Secondary | ICD-10-CM

## 2019-07-30 DIAGNOSIS — N179 Acute kidney failure, unspecified: Secondary | ICD-10-CM | POA: Diagnosis not present

## 2019-07-30 DIAGNOSIS — K760 Fatty (change of) liver, not elsewhere classified: Secondary | ICD-10-CM

## 2019-07-30 DIAGNOSIS — N183 Chronic kidney disease, stage 3 unspecified: Secondary | ICD-10-CM

## 2019-07-30 DIAGNOSIS — F12188 Cannabis abuse with other cannabis-induced disorder: Secondary | ICD-10-CM | POA: Diagnosis not present

## 2019-07-30 DIAGNOSIS — I700 Atherosclerosis of aorta: Secondary | ICD-10-CM

## 2019-07-30 MED ORDER — PROMETHAZINE HCL 25 MG RE SUPP: 25.0000 mg | Freq: Four times a day (QID) | RECTAL | 0 refills | Status: DC | PRN

## 2019-07-30 MED ORDER — PROMETHAZINE HCL 12.5 MG PO TABS: 12.5000 mg | ORAL_TABLET | Freq: Four times a day (QID) | ORAL | 0 refills | Status: DC | PRN

## 2019-07-30 NOTE — Progress Notes (Signed)
BP 132/83    Pulse 92    Temp 99.3 F (37.4 C) (Oral)    Ht 5\' 5"  (1.651 m)    Wt 254 lb (115.2 kg)    SpO2 100%    BMI 42.27 kg/m    Subjective:    Patient ID: [name] [name], female    DOB: 28-Sep-1981, 38 y.o.   MRN: [id number]  HPI: [name] [name] is a 38 y.o. female  Chief Complaint  Patient presents with   Hospitalization Follow-up   Transition of [facility] Follow up.   Hospital/Facility:  [facility] D/C Physician: Dr. Leslye Peer D/C Date: 07/28/19  Records Requested: 07/30/19 Records Received: 07/30/19 Records Reviewed: 07/30/19  Diagnoses on Discharge: AKI  Date of interactive Contact within 48 hours of discharge: Called 2x with no answer  Contact was through: phone  Date of 7 day or 14 day face-to-face visit:  07/30/19  within 7 days  Outpatient Encounter Medications as of 07/30/2019  Medication Sig Note   amitriptyline (ELAVIL) 25 MG tablet Take 25 mg by mouth at bedtime.    aspirin EC 81 MG tablet Take 81 mg by mouth daily.    Cholecalciferol 2000 units TABS Take 2,000 Units by mouth daily.     clopidogrel (PLAVIX) 75 MG tablet Take 1 tablet (75 mg total) by mouth daily.    DULoxetine (CYMBALTA) 30 MG capsule Take 60 mg by mouth daily.     etonogestrel (NEXPLANON) 68 MG IMPL implant 1 each by Subdermal route once.    insulin aspart (NOVOLOG) 100 UNIT/ML injection Inject 10 Units into the skin 3 (three) times daily with meals.    Insulin Glargine (BASAGLAR KWIKPEN) 100 UNIT/ML SOPN Inject 0.3 mLs (30 Units total) into the skin daily.    isosorbide mononitrate (IMDUR) 30 MG 24 hr tablet Take 2 tablets (60 mg total) by mouth daily.    lactulose (CHRONULAC) 10 GM/15ML solution Take 45 mLs (30 g total) by mouth daily as needed for severe constipation.    meclizine (ANTIVERT) 25 MG tablet Take 25 mg by mouth 3 (three) times daily as needed for dizziness.    metoCLOPramide (REGLAN) 10 MG tablet Take 1 tablet (10 mg total) by mouth 4 (four) times daily -  before  meals and at bedtime.    metoprolol tartrate (LOPRESSOR) 100 MG tablet Take 1 tablet (100 mg total) by mouth 2 (two) times daily.    mirtazapine (REMERON) 30 MG tablet Take 2 tablets (60 mg total) by mouth at bedtime.    nitroGLYCERIN (NITROSTAT) 0.4 MG SL tablet Take 1 tab under your tongue while sitting for chest pain, If no relief may repeat, one tab every 5 min up to 3 tabs total over 15 mins.    promethazine (PHENERGAN) 12.5 MG tablet Take 1 tablet (12.5 mg total) by mouth every 6 (six) hours as needed for nausea or vomiting.    senna-docusate (SENOKOT-S) 8.6-50 MG tablet Take 1 tablet by mouth 2 (two) times daily.    [DISCONTINUED] promethazine (PHENERGAN) 12.5 MG tablet Take 1 tablet (12.5 mg total) by mouth every 6 (six) hours as needed for nausea or vomiting.    furosemide (LASIX) 40 MG tablet Take 1 tablet (40 mg total) by mouth every other day. 06/18/2019: Takes as needed   pantoprazole (PROTONIX) 40 MG tablet Take 1 tablet (40 mg total) by mouth 2 (two) times daily.    promethazine (PHENERGAN) 25 MG suppository Place 1 suppository (25 mg total) rectally every 6 (six) hours as needed  for nausea or vomiting.    No facility-administered encounter medications on file as of 07/30/2019.   Per hospitalist: " HOSPITAL COURSE:   1.  Abdominal pain with nausea vomiting.  Cannabis hyperemesis syndrome, patient was advised to stop smoking marijuana.  Patient states that she stopped smoking about a month ago but still on the urine toxicology which can take about a month to come out of the system.  Diabetic gastroparesis likely contributing.  The patient also got sick yesterday secondary to MiraLAX so I discontinued this medication.  Lactulose will be given as needed for constipation and senna Colace combination as outpatient.  Patient tolerated dinner last night and breakfast this morning and was discharged home in stable condition. 2.  Acute kidney injury on chronic kidney disease stage III.   Patient received IV fluids during the entire hospital stay.  Creatinine improved down to 1.4 on disposition.  I held her ARB.  Consider restarting as outpatient. 3.  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  We gave low-dose Lantus and sliding scale here in the hospital.  Patient states that she takes Basaglar insulin 60 units daily.  At this point I think we can decrease it down to 30 units daily and do sliding scale.  Hold on oral medications. 4.  Hypertension on metoprolol and Imdur 5.  GERD on Protonix 6.  Depression on amitriptyline and Cymbalta"   Diagnostic Tests Reviewed:   CLINICAL DATA:  39 year old female with abdominal pain. Gastroparesis and chronic abdominal pain.  EXAM: CT ABDOMEN WITHOUT CONTRAST  TECHNIQUE: Multidetector CT imaging of the abdomen was performed following the standard protocol without IV contrast.  COMPARISON:  CT of the abdomen pelvis dated 06/19/2019  FINDINGS: Evaluation of this exam is limited in the absence of intravenous contrast.  Lower chest: The visualized lung bases are clear. There is coronary vascular calcification. There is hypoattenuation of the cardiac blood pool suggestive of a degree of anemia. Clinical correlation is recommended.  No free air or free fluid noted in the abdomen.  Hepatobiliary: There is mild fatty infiltration of the liver. No intrahepatic biliary ductal dilatation. The gallbladder is unremarkable.  Pancreas: Unremarkable. No pancreatic ductal dilatation or surrounding inflammatory changes.  Spleen: Normal in size without focal abnormality.  Adrenals/Urinary Tract: The adrenal glands are unremarkable. There is no hydronephrosis or nephrolithiasis on either side. The visualized ureters appear unremarkable.  Stomach/Bowel: Oral contrast is noted the stomach. The stomach is unremarkable. There is no bowel dilatation the visualized abdomen. No inflammatory changes of the  bowel noted.  Vascular/Lymphatic: Mild atherosclerotic calcification of the aorta. There is mild haziness of the fat surrounding celiac axis and SMA similar to prior CT, nonspecific. No portal venous gas. There is no adenopathy.  Other: None  Musculoskeletal: No acute or significant osseous findings.  IMPRESSION: 1. No acute intra-abdominal pathology. 2. Mild fatty liver. 3. Coronary vascular calcification and aortic Atherosclerosis (ICD10-I70.0).  Disposition: Home  Consults: None  Discharge Instructions: Follow up here  Disease/illness Education: Given today  Home Health/Community Services Discussions/Referrals:  N/A  Establishment or re-establishment of referral orders for community resources: N/A  Discussion with other health care providers: N/A  Assessment and Support of treatment regimen adherence: Given today  Appointments Coordinated with: Patient   Education for self-management, independent living, and ADLs: Given today  Threw up all day couple of days ago. Continues to throw up a lot. Continues with abdominal pain. Not feeling well at all. She notes that she  did not feel better when she left the hospital and continues to not feel well. She is having abdominal pain. She notes that the phenergan doesn't stay down for it to work. She states that dissolvable zofran does not work. She is not feeling well at all.   Relevant past medical, surgical, family and social history reviewed and updated as indicated. Interim medical history since our last visit reviewed. Allergies and medications reviewed and updated.  Review of Systems  Constitutional: Positive for fatigue. Negative for activity change, appetite change, chills, diaphoresis, fever and unexpected weight change.  HENT: Negative.   Respiratory: Negative.   Cardiovascular: Negative.   Gastrointestinal: Positive for abdominal pain, nausea and vomiting. Negative for abdominal distention, anal bleeding, blood  in stool, constipation, diarrhea and rectal pain.  Neurological: Negative.   Psychiatric/Behavioral: Negative.     Per HPI unless specifically indicated above     Objective:    BP 132/83    Pulse 92    Temp 99.3 F (37.4 C) (Oral)    Ht 5\' 5"  (1.651 m)    Wt 254 lb (115.2 kg)    SpO2 100%    BMI 42.27 kg/m   Wt Readings from Last 3 Encounters:  07/30/19 254 lb (115.2 kg)  07/23/19 251 lb 5.2 oz (114 kg)  06/30/19 268 lb (121.6 kg)    Physical Exam Vitals signs and nursing note reviewed.  Constitutional:      General: She is not in acute distress.    Appearance: Normal appearance. She is not ill-appearing, toxic-appearing or diaphoretic.  HENT:     Head: Normocephalic and atraumatic.     Right Ear: External ear normal.     Left Ear: External ear normal.     Nose: Nose normal.     Mouth/Throat:     Mouth: Mucous membranes are moist.     Pharynx: Oropharynx is clear.  Eyes:     General: No scleral icterus.       Right eye: No discharge.        Left eye: No discharge.     Extraocular Movements: Extraocular movements intact.     Conjunctiva/sclera: Conjunctivae normal.     Pupils: Pupils are equal, round, and reactive to light.  Neck:     Musculoskeletal: Normal range of motion and neck supple.  Cardiovascular:     Rate and Rhythm: Normal rate and regular rhythm.     Pulses: Normal pulses.     Heart sounds: Normal heart sounds. No murmur. No friction rub. No gallop.   Pulmonary:     Effort: Pulmonary effort is normal. No respiratory distress.     Breath sounds: Normal breath sounds. No stridor. No wheezing, rhonchi or rales.  Chest:     Chest wall: No tenderness.  Musculoskeletal: Normal range of motion.  Skin:    General: Skin is warm and dry.     Capillary Refill: Capillary refill takes less than 2 seconds.     Coloration: Skin is not jaundiced or pale.     Findings: No bruising, erythema, lesion or rash.  Neurological:     General: No focal deficit present.      Mental Status: She is alert and oriented to person, place, and time. Mental status is at baseline.  Psychiatric:        Mood and Affect: Mood normal.        Behavior: Behavior normal.        Thought Content: Thought content normal.  Judgment: Judgment normal.     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 Await results. Call with any concerns.       Atherosclerosis of aorta ([place])    Will work on keeping her blood pressure, cholesterol and sugars under good control. Call with any concerns.         Digestive   Diabetic gastroparesis associated with type 2 diabetes mellitus ([place])    Unclear if her abdominal pain is due to canabis hyperemesis (has not smoked in >1 month) given the severity of her vomiting. Possibly due to gastroparesis. We will get her into see GI ASAP. Referral generated today. Await their input. Call with any concerns. Unclear as to why hospitalist changed her to sliding scale and decreased her insulin. A1c 8.2 and that is harder for her manage. Will leave it alone for now and get her back into see PCP ASAP for evaluation and seeing if this should be changed back. Continue to work with [facility] to help with her medications.       Relevant Orders   Ambulatory referral to Gastroenterology   Diabetic  gastroparesis ([place])    Unclear if her abdominal pain is due to canabis hyperemesis (has not smoked in >1 month) given the severity of her vomiting. Possibly due to gastroparesis. We will get her into see GI ASAP. Referral generated today. Await their input. Call with any concerns      Fatty liver    Found incidentally on CT. Work on diet and exercise. Continue to monitor.         Endocrine   Poorly controlled type 2 diabetes mellitus ([place])    Unclear as to why hospitalist changed her to sliding scale and decreased her insulin. A1c 8.2 and that is harder for her manage. Will leave it alone for now and get her back into see PCP ASAP for evaluation and seeing if this should be changed back. Continue to work with [facility] to help with her medications.         Genitourinary   AKI (acute kidney injury) ([place])    Rechecking labs today. Await results.       Relevant Orders   Basic metabolic panel (Completed)    Other Visit Diagnoses    Cannabis hyperemesis syndrome concurrent with and due to cannabis abuse ([place])    -  Primary   Unclear if vomiting is due to this or gastroparesis. No opiates as they will slow the gut. Pt states zofran doesn't work. Phenergan supp given. GI referral ASAP       Follow  up plan: Return 2 weeks with PCP.

## 2019-07-31 LAB — BASIC METABOLIC PANEL
BUN/Creatinine Ratio: 11 (ref 9–23)
BUN: 15 mg/dL (ref 6–20)
CO2: 19 mmol/L — ABNORMAL LOW (ref 20–29)
Calcium: 8.6 mg/dL — ABNORMAL LOW (ref 8.7–10.2)
Chloride: 102 mmol/L (ref 96–106)
Creatinine, Ser: 1.36 mg/dL — ABNORMAL HIGH (ref 0.57–1.00)
GFR calc Af Amer: 57 mL/min/{1.73_m2} — ABNORMAL LOW (ref >59)
GFR calc non Af Amer: 49 mL/min/{1.73_m2} — ABNORMAL LOW (ref >59)
Glucose: 199 mg/dL — ABNORMAL HIGH (ref 65–99)
Potassium: 4.1 mmol/L (ref 3.5–5.2)
Sodium: 140 mmol/L (ref 134–144)

## 2019-08-02 ENCOUNTER — Telehealth: Payer: Self-pay | Admitting: Nurse Practitioner

## 2019-08-02 NOTE — Telephone Encounter (Signed)
Patient reporting continued pain in abdomen ongoing, since hospital discharge.  States Phenergan is helping with nausea but not with pain.  Consult with Dr. Wynetta Emery, who she visited with Friday, we both agree patient needs to return to ER for further evaluation and possible IV pain medications as concern for slowing motility with oral medications at this time, patient with gastroparesis at baseline.  She was able to verbalize plan back to provider and agrees.

## 2019-08-02 NOTE — Telephone Encounter (Signed)
Called pt to reschedule appt. She is wanting to have provider call her when she gets a chance. She didn't say what it was in regards to.

## 2019-08-03 NOTE — Telephone Encounter (Signed)
See telephone encounter from 08/02/19

## 2019-08-04 ENCOUNTER — Encounter: Payer: Self-pay | Admitting: Family Medicine

## 2019-08-04 DIAGNOSIS — K760 Fatty (change of) liver, not elsewhere classified: Secondary | ICD-10-CM | POA: Insufficient documentation

## 2019-08-04 DIAGNOSIS — I700 Atherosclerosis of aorta: Secondary | ICD-10-CM | POA: Insufficient documentation

## 2019-08-04 NOTE — Assessment & Plan Note (Signed)
Rechecking kidney function today. Await results. Call with any concerns.

## 2019-08-04 NOTE — Assessment & Plan Note (Signed)
Found incidentally on CT. Work on diet and exercise. Continue to monitor.

## 2019-08-04 NOTE — Assessment & Plan Note (Signed)
Unclear as to why hospitalist changed her to sliding scale and decreased her insulin. A1c 8.2 and that is harder for her manage. Will leave it alone for now and get her back into see PCP ASAP for evaluation and seeing if this should be changed back. Continue to work with [facility] to help with her medications.

## 2019-08-04 NOTE — Assessment & Plan Note (Signed)
Unclear if her abdominal pain is due to canabis hyperemesis (has not smoked in >1 month) given the severity of her vomiting. Possibly due to gastroparesis. We will get her into see GI ASAP. Referral generated today. Await their input. Call with any concerns

## 2019-08-04 NOTE — Assessment & Plan Note (Signed)
Will work on keeping her blood pressure, cholesterol and sugars under good control. Call with any concerns.

## 2019-08-04 NOTE — Assessment & Plan Note (Signed)
Unclear if her abdominal pain is due to canabis hyperemesis (has not smoked in >1 month) given the severity of her vomiting. Possibly due to gastroparesis. We will get her into see GI ASAP. Referral generated today. Await their input. Call with any concerns. Unclear as to why hospitalist changed her to sliding scale and decreased her insulin. A1c 8.2 and that is harder for her manage. Will leave it alone for now and get her back into see PCP ASAP for evaluation and seeing if this should be changed back. Continue to work with [facility] to help with her medications.

## 2019-08-04 NOTE — Assessment & Plan Note (Signed)
Rechecking labs today. Await results.  

## 2019-08-16 ENCOUNTER — Ambulatory Visit: Payer: Self-pay | Admitting: Nurse Practitioner

## 2019-08-18 ENCOUNTER — Ambulatory Visit (INDEPENDENT_AMBULATORY_CARE_PROVIDER_SITE_OTHER): Payer: Medicare Other | Admitting: Nurse Practitioner

## 2019-08-18 ENCOUNTER — Encounter: Payer: Self-pay | Admitting: Nurse Practitioner

## 2019-08-18 ENCOUNTER — Other Ambulatory Visit: Payer: Self-pay

## 2019-08-18 VITALS — BP 147/99 | HR 95 | Temp 98.5°F | Ht 65.0 in | Wt 253.0 lb

## 2019-08-18 DIAGNOSIS — E1169 Type 2 diabetes mellitus with other specified complication: Secondary | ICD-10-CM

## 2019-08-18 DIAGNOSIS — E1143 Type 2 diabetes mellitus with diabetic autonomic (poly)neuropathy: Secondary | ICD-10-CM | POA: Diagnosis not present

## 2019-08-18 DIAGNOSIS — I130 Hypertensive heart and chronic kidney disease with heart failure and stage 1 through stage 4 chronic kidney disease, or unspecified chronic kidney disease: Secondary | ICD-10-CM

## 2019-08-18 DIAGNOSIS — K3184 Gastroparesis: Secondary | ICD-10-CM | POA: Diagnosis not present

## 2019-08-18 DIAGNOSIS — N183 Chronic kidney disease, stage 3 unspecified: Secondary | ICD-10-CM

## 2019-08-18 DIAGNOSIS — I200 Unstable angina: Secondary | ICD-10-CM

## 2019-08-18 DIAGNOSIS — E785 Hyperlipidemia, unspecified: Secondary | ICD-10-CM | POA: Diagnosis not present

## 2019-08-18 DIAGNOSIS — Z23 Encounter for immunization: Secondary | ICD-10-CM

## 2019-08-18 NOTE — Assessment & Plan Note (Signed)
Chronic, ongoing with poor tolerance to statins.  Followed by cardiology.  Have had discussions with patient about PCSK-9 injectable, which she wishes to try but had wanted GI issues to improve first.  Will continue to collaborate with [facility] pharmacist on this, if GI issues continue to improve consider addition of injectable with assistance in obtaining. Lipid panel today.

## 2019-08-18 NOTE — Assessment & Plan Note (Signed)
Chronic, ongoing.  BP slightly elevated above goal today, but home readings closer to goal.  Continue current medication regimen and collaboration with cardiology team.  Lipid panel and CMP today.

## 2019-08-18 NOTE — Patient Instructions (Signed)
Carbohydrate Counting for Diabetes Mellitus, Adult  Carbohydrate counting is a method of keeping track of how many carbohydrates you eat. Eating carbohydrates naturally increases the amount of sugar (glucose) in the blood. Counting how many carbohydrates you eat helps keep your blood glucose within normal limits, which helps you manage your diabetes (diabetes mellitus). It is important to know how many carbohydrates you can safely have in each meal. This is different for every person. A diet and nutrition specialist (registered dietitian) can help you make a meal plan and calculate how many carbohydrates you should have at each meal and snack. Carbohydrates are found in the following foods:  Grains, such as breads and cereals.  Dried beans and soy products.  Starchy vegetables, such as potatoes, peas, and corn.  Fruit and fruit juices.  Milk and yogurt.  Sweets and snack foods, such as cake, cookies, candy, chips, and soft drinks. How do I count carbohydrates? There are two ways to count carbohydrates in food. You can use either of the methods or a combination of both. Reading "Nutrition Facts" on packaged food The "Nutrition Facts" list is included on the labels of almost all packaged foods and beverages in the [place] It includes:  The serving size.  Information about nutrients in each serving, including the grams (g) of carbohydrate per serving. To use the "Nutrition Facts":  Decide how many servings you will have.  Multiply the number of servings by the number of carbohydrates per serving.  The resulting number is the total amount of carbohydrates that you will be having. Learning standard serving sizes of other foods When you eat carbohydrate foods that are not packaged or do not include "Nutrition Facts" on the label, you need to measure the servings in order to count the amount of carbohydrates:  Measure the foods that you will eat with a food scale or measuring cup, if needed.   Decide how many standard-size servings you will eat.  Multiply the number of servings by 15. Most carbohydrate-rich foods have about 15 g of carbohydrates per serving. ? For example, if you eat 8 oz (170 g) of strawberries, you will have eaten 2 servings and 30 g of carbohydrates (2 servings x 15 g = 30 g).  For foods that have more than one food mixed, such as soups and casseroles, you must count the carbohydrates in each food that is included. The following list contains standard serving sizes of common carbohydrate-rich foods. Each of these servings has about 15 g of carbohydrates:   hamburger bun or  English muffin.   oz (15 mL) syrup.   oz (14 g) jelly.  1 slice of bread.  1 six-inch tortilla.  3 oz (85 g) cooked rice or pasta.  4 oz (113 g) cooked dried beans.  4 oz (113 g) starchy vegetable, such as peas, corn, or potatoes.  4 oz (113 g) hot cereal.  4 oz (113 g) mashed potatoes or  of a large baked potato.  4 oz (113 g) canned or frozen fruit.  4 oz (120 mL) fruit juice.  4-6 crackers.  6 chicken nuggets.  6 oz (170 g) unsweetened dry cereal.  6 oz (170 g) plain fat-free yogurt or yogurt sweetened with artificial sweeteners.  8 oz (240 mL) milk.  8 oz (170 g) fresh fruit or one small piece of fruit.  24 oz (680 g) popped popcorn. Example of carbohydrate counting Sample meal  3 oz (85 g) chicken breast.  6 oz (170 g)   brown rice.  4 oz (113 g) corn.  8 oz (240 mL) milk.  8 oz (170 g) strawberries with sugar-free whipped topping. Carbohydrate calculation 1. Identify the foods that contain carbohydrates: ? Rice. ? Corn. ? Milk. ? Strawberries. 2. Calculate how many servings you have of each food: ? 2 servings rice. ? 1 serving corn. ? 1 serving milk. ? 1 serving strawberries. 3. Multiply each number of servings by 15 g: ? 2 servings rice x 15 g = 30 g. ? 1 serving corn x 15 g = 15 g. ? 1 serving milk x 15 g = 15 g. ? 1 serving  strawberries x 15 g = 15 g. 4. Add together all of the amounts to find the total grams of carbohydrates eaten: ? 30 g + 15 g + 15 g + 15 g = 75 g of carbohydrates total. Summary  Carbohydrate counting is a method of keeping track of how many carbohydrates you eat.  Eating carbohydrates naturally increases the amount of sugar (glucose) in the blood.  Counting how many carbohydrates you eat helps keep your blood glucose within normal limits, which helps you manage your diabetes.  A diet and nutrition specialist (registered dietitian) can help you make a meal plan and calculate how many carbohydrates you should have at each meal and snack. This information is not intended to replace advice given to you by your health care provider. Make sure you discuss any questions you have with your health care provider. Document Released: 11/25/2005 Document Revised: 06/19/2017 Document Reviewed: 05/08/2016 Elsevier Patient Education  2020 [employer]  

## 2019-08-18 NOTE — Assessment & Plan Note (Signed)
Seen by [facility] GI recently and improvement noted with increase in Amitriptyline.  Continue current medication.  Also note improvement recently in A1C.  Continue current diabetic regimen (has returned to regimen ordered by endo) and collaboration with both GI and endocrinology.  Continue to collaborate with [facility] team.  Return in 3 months.

## 2019-08-18 NOTE — Progress Notes (Addendum)
BP (!) 147/99   Pulse 95 Comment: apical  Temp 98.5 F (36.9 C) (Oral)   Ht 5\' 5"  (1.651 m)   Wt 253 lb (114.8 kg)   SpO2 100%   BMI 42.10 kg/m    Subjective:    Patient ID: [name] [name], female    DOB: [birth year], 38 y.o.   MRN: [id number]  HPI: [name] [name] is a 38 y.o. female  Chief Complaint  Patient presents with  . Follow-up    2w  . Diabetes  . Hypertension   DIABETES WITH GASTROPARESIS Last seen by endocrinology 06/08/2019 with A1C 9.7 and 8.2 when in hospital 07/23/19.  Her Basaglar was changed to 50 units and Novolog changed.  She is also followed by GI for ongoing abdominal pain and last seen by [facility] 08/05/2019 and her Amtriptyline was increased, which she reports is helping dramatically.  Also taking Lactulose daily, which she reports help.  Has cut back on MJ use.   Hypoglycemic episodes:no Polydipsia/polyuria: no Visual disturbance: no Chest pain: no Paresthesias: no Glucose Monitoring: yes  Accucheck frequency: BID  Fasting glucose: 170-180  Post prandial:  Evening: 180's  Before meals: Taking Insulin?: yes  Long acting insulin: 50 Basaglar  Short acting insulin: 14 breakfast, 14 lunch, 20 supper Blood Pressure Monitoring: daily Retinal Examination: Up to Date Foot Exam: Up to Date Pneumovax: Up to Date Influenza: Up to Date Aspirin: yes   HYPERTENSION / HYPERLIPIDEMIA Followed by cardiology and last seen 04/29/2019. Is statin intolerant and CCM team working on getting injectable.   Satisfied with current treatment? yes Duration of hypertension: chronic BP monitoring frequency: daily BP range: 120-140/80's BP medication side effects: no Duration of hyperlipidemia: chronic Cholesterol supplements: none Medication compliance: good compliance Aspirin: yes Recent stressors: no Recurrent headaches: no Visual changes: no Palpitations: no Dyspnea: no Chest pain: no Lower extremity edema: no Dizzy/lightheaded: no  Relevant past medical,  surgical, family and social history reviewed and updated as indicated. Interim medical history since our last visit reviewed. Allergies and medications reviewed and updated.  Review of Systems  Constitutional: Negative for activity change, appetite change, diaphoresis, fatigue and fever.  Respiratory: Negative for cough, chest tightness and shortness of breath.   Cardiovascular: Negative for chest pain, palpitations and leg swelling.  Gastrointestinal: Negative for abdominal distention, abdominal pain, constipation, diarrhea, nausea and vomiting.  Endocrine: Negative for cold intolerance, heat intolerance, polydipsia, polyphagia and polyuria.  Neurological: Negative for dizziness, syncope, weakness, light-headedness, numbness and headaches.  Psychiatric/Behavioral: Negative.     Per HPI unless specifically indicated above     Objective:    BP (!) 147/99   Pulse 95 Comment: apical  Temp 98.5 F (36.9 C) (Oral)   Ht 5\' 5"  (1.651 m)   Wt 253 lb (114.8 kg)   SpO2 100%   BMI 42.10 kg/m   Wt Readings from Last 3 Encounters:  08/18/19 253 lb (114.8 kg)  07/30/19 254 lb (115.2 kg)  07/23/19 251 lb 5.2 oz (114 kg)    Physical Exam Vitals signs and nursing note reviewed.  Constitutional:      General: She is awake. She is not in acute distress.    Appearance: She is well-developed. She is obese. She is not ill-appearing.  HENT:     Head: Normocephalic.     Right Ear: Hearing normal.     Left Ear: Hearing normal.  Eyes:     General: Lids are normal.        Right eye:  No discharge.        Left eye: No discharge.     Conjunctiva/sclera: Conjunctivae normal.     Pupils: Pupils are equal, round, and reactive to light.  Neck:     Musculoskeletal: Normal range of motion and neck supple.     Thyroid: No thyromegaly.     Vascular: No carotid bruit.  Cardiovascular:     Rate and Rhythm: Normal rate and regular rhythm.     Heart sounds: Normal heart sounds. No murmur. No gallop.    Pulmonary:     Effort: Pulmonary effort is normal. No accessory muscle usage or respiratory distress.     Breath sounds: Normal breath sounds.  Abdominal:     General: Bowel sounds are normal.     Palpations: Abdomen is soft. There is no hepatomegaly or splenomegaly.     Tenderness: There is no abdominal tenderness.  Musculoskeletal:     Right lower leg: No edema.     Left lower leg: No edema.  Skin:    General: Skin is warm and dry.  Neurological:     Mental Status: She is alert and oriented to person, place, and time.  Psychiatric:        Attention and Perception: Attention normal.        Mood and Affect: Mood normal.        Behavior: Behavior normal. Behavior is cooperative.        Thought Content: Thought content normal.        Judgment: Judgment normal.     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  BP slightly elevated above goal today, but home readings closer to goal.  Continue current medication regimen and collaboration with cardiology team.  Lipid panel and CMP today.      Relevant Orders   Comprehensive metabolic panel     Digestive   Diabetic gastroparesis associated with type 2 diabetes mellitus ([place]) - Primary    Seen by [facility] recently and improvement noted with increase in Amitriptyline.  Continue current medication.   Also note improvement recently in A1C.  Continue current diabetic regimen (has returned to regimen ordered by endo) and collaboration with both GI and endocrinology.  Continue to collaborate with CCM team.  Return in 3 months.        Endocrine   Hyperlipidemia associated with type 2 diabetes mellitus (HCC)    Chronic, ongoing with poor tolerance to statins.  Followed by cardiology.  Have had discussions with patient about PCSK-9 injectable, which she wishes to try but had wanted GI issues to improve first.  Will continue to collaborate with CCM pharmacist on this, if GI issues continue to improve consider addition of injectable with assistance in obtaining. Lipid panel today.      Relevant Orders   Comprehensive metabolic panel   Lipid Panel w/o Chol/HDL Ratio    Other Visit Diagnoses    Flu vaccine need       Relevant Orders   Flu Vaccine QUAD 36+ mos IM (Completed)       Follow up plan: Return in about 3 months (around 11/17/2019) for T2DM, HTN/HLD, HF.

## 2019-08-19 LAB — LIPID PANEL W/O CHOL/HDL RATIO
Cholesterol, Total: 217 mg/dL — ABNORMAL HIGH (ref 100–199)
HDL: 47 mg/dL (ref >39)
LDL Chol Calc (NIH): 138 mg/dL — ABNORMAL HIGH (ref 0–99)
Triglycerides: 177 mg/dL — ABNORMAL HIGH (ref 0–149)
VLDL Cholesterol Cal: 32 mg/dL (ref 5–40)

## 2019-08-19 LAB — COMPREHENSIVE METABOLIC PANEL
ALT: 26 IU/L (ref 0–32)
AST: 36 IU/L (ref 0–40)
Albumin/Globulin Ratio: 1 — ABNORMAL LOW (ref 1.2–2.2)
Albumin: 3 g/dL — ABNORMAL LOW (ref 3.8–4.8)
Alkaline Phosphatase: 126 IU/L — ABNORMAL HIGH (ref 39–117)
BUN/Creatinine Ratio: 9 (ref 9–23)
BUN: 10 mg/dL (ref 6–20)
Bilirubin Total: <0.2 mg/dL (ref 0.0–1.2)
CO2: 24 mmol/L (ref 20–29)
Calcium: 8.8 mg/dL (ref 8.7–10.2)
Chloride: 106 mmol/L (ref 96–106)
Creatinine, Ser: 1.11 mg/dL — ABNORMAL HIGH (ref 0.57–1.00)
GFR calc Af Amer: 72 mL/min/{1.73_m2} (ref >59)
GFR calc non Af Amer: 63 mL/min/{1.73_m2} (ref >59)
Globulin, Total: 3.1 g/dL (ref 1.5–4.5)
Glucose: 105 mg/dL — ABNORMAL HIGH (ref 65–99)
Potassium: 5.3 mmol/L — ABNORMAL HIGH (ref 3.5–5.2)
Sodium: 142 mmol/L (ref 134–144)
Total Protein: 6.1 g/dL (ref 6.0–8.5)

## 2019-08-20 ENCOUNTER — Other Ambulatory Visit: Payer: Self-pay | Admitting: Nurse Practitioner

## 2019-08-20 ENCOUNTER — Ambulatory Visit (INDEPENDENT_AMBULATORY_CARE_PROVIDER_SITE_OTHER): Payer: Medicare Other | Admitting: Pharmacist

## 2019-08-20 DIAGNOSIS — E1169 Type 2 diabetes mellitus with other specified complication: Secondary | ICD-10-CM | POA: Diagnosis not present

## 2019-08-20 DIAGNOSIS — E785 Hyperlipidemia, unspecified: Secondary | ICD-10-CM

## 2019-08-20 DIAGNOSIS — E1165 Type 2 diabetes mellitus with hyperglycemia: Secondary | ICD-10-CM

## 2019-08-20 MED ORDER — REPATHA SURECLICK 140 MG/ML ~~LOC~~ SOAJ: 140.0000 mg | SUBCUTANEOUS | 3 refills | Status: DC

## 2019-08-20 NOTE — Progress Notes (Signed)
Reviewed recent CCM pharmacist note, script for [place] sent.

## 2019-08-20 NOTE — Chronic Care Management (AMB) (Signed)
Chronic Care Management   Follow Up Note   08/20/2019 Name: [name] [name] MRN: [id number] DOB: Dec 28, 1980  Referred by: Venita Lick, NP Reason for referral : Chronic Care Management (Medication Management)   [name] [name] is a 38 y.o. year old female who is a primary care patient of Cannady, Barbaraann Faster, NP. The CCM team was consulted for assistance with chronic disease management and care coordination needs.    Spoke with patient telephonically for medication management review.   Review of patient status, including review of consultants reports, relevant laboratory and other test results, and collaboration with appropriate care team members and the patient's provider was performed as part of comprehensive patient evaluation and provision of chronic care management services.    SDOH (Social Determinants of Health) screening performed today: Financial Strain  Stress. See Care Plan for related entries.   Outpatient Encounter Medications as of 08/20/2019  Medication Sig Note  . ACCU-CHEK AVIVA PLUS test strip USE 1 STRIP TO CHECK GLUCOSE THREE TIMES DAILY   . amitriptyline (ELAVIL) 25 MG tablet Take 25 mg by mouth at bedtime.   Marland Kitchen aspirin EC 81 MG tablet Take 81 mg by mouth daily.   . Cholecalciferol 2000 units TABS Take by mouth daily.    . clopidogrel (PLAVIX) 75 MG tablet Take 1 tablet (75 mg total) by mouth daily.   . dapagliflozin propanediol (FARXIGA) 10 MG TABS tablet Take 10 mg by mouth daily.   . DULoxetine (CYMBALTA) 30 MG capsule Take 60 mg by mouth daily.    Marland Kitchen etonogestrel (NEXPLANON) 68 MG IMPL implant 1 each by Subdermal route once.   . furosemide (LASIX) 40 MG tablet Take 1 tablet (40 mg total) by mouth every other day. 06/18/2019: Takes as needed  . glimepiride (AMARYL) 4 MG tablet Take 4 mg by mouth daily with breakfast.   . insulin aspart (NOVOLOG) 100 UNIT/ML injection Inject 10 Units into the skin 3 (three) times daily with meals. 08/20/2019: 14/14/20  . Insulin  Glargine (BASAGLAR KWIKPEN) 100 UNIT/ML SOPN Inject 0.3 mLs (30 Units total) into the skin daily. 08/20/2019: 50 units   . irbesartan (AVAPRO) 75 MG tablet Take 75 mg by mouth daily.   . isosorbide mononitrate (IMDUR) 30 MG 24 hr tablet Take 2 tablets (60 mg total) by mouth daily.   Marland Kitchen lactulose (CHRONULAC) 10 GM/15ML solution Take 45 mLs (30 g total) by mouth daily as needed for severe constipation. 08/20/2019: Daily   . meclizine (ANTIVERT) 25 MG tablet Take 25 mg by mouth 3 (three) times daily as needed for dizziness.   . metoCLOPramide (REGLAN) 10 MG tablet Take 1 tablet (10 mg total) by mouth 4 (four) times daily -  before meals and at bedtime.   . metoprolol tartrate (LOPRESSOR) 100 MG tablet Take 1 tablet (100 mg total) by mouth 2 (two) times daily.   . mirtazapine (REMERON) 30 MG tablet Take 2 tablets (60 mg total) by mouth at bedtime.   . prochlorperazine (COMPAZINE) 5 MG tablet TAKE 1 TABLET BY MOUTH EVERY 6 HOURS AS NEEDED FOR NAUSEA AND VOMITING   . promethazine (PHENERGAN) 12.5 MG tablet Take 1 tablet (12.5 mg total) by mouth every 6 (six) hours as needed for nausea or vomiting.   . promethazine (PHENERGAN) 25 MG suppository Place 1 suppository (25 mg total) rectally every 6 (six) hours as needed for nausea or vomiting.   . senna-docusate (SENOKOT-S) 8.6-50 MG tablet Take 1 tablet by mouth 2 (two) times daily.   Marland Kitchen  nitroGLYCERIN (NITROSTAT) 0.4 MG SL tablet Take 1 tab under your tongue while sitting for chest pain, If no relief may repeat, one tab every 5 min up to 3 tabs total over 15 mins. (Patient not taking: Reported on 08/20/2019)   . pantoprazole (PROTONIX) 40 MG tablet Take 1 tablet (40 mg total) by mouth 2 (two) times daily.    No facility-administered encounter medications on file as of 08/20/2019.      Goals Addressed            This Visit's Progress     Patient Stated   . "I don't want to have a heart attack" (pt-stated)       Current Barriers:  . Uncontrolled  hyperlipidemia, complicated by CAD (NSTEMI, DES in 03/2014, DES again in 11/2018); cardiologist Dr. Fletcher Anon  . Current antihyperlipidemic regimen: none . Previous antihyperlipidemic medications tried: atorvastatin 20 mg daily and 40 mg daily; rosuvastatin 5 mg daily and 5 mg every other day; simvastatin 20 mg daily and 40 mg daily; all causing significant muscle pain/myalgias interrupting ADLs . Most recent lipid panel: LDL: 138, TG: 177, TC: 217, HDL: 47 . ASCVD risk enhancing conditions: DM, HTN, CKD, CHF  Pharmacist Clinical Goal(s):  Marland Kitchen Over the next 90 days, patient will work with PharmD and providers towards optimized antihyperlipidemic therapy  Interventions: . Comprehensive medication review performed; medication list updated in electronic medical record.  . Patient a candidate for PCSK9 therapy for secondary prevention of ASCVD. Recommend sending prescription for Repatha 140 mg SQ Q2 weeks; PA will likely be required. If approved, will see cost to see if patient can afford, should be affordable given Medicare/Medicaid dual coverage   Patient Self Care Activities:  . Patient will focus on medication adherence  Initial goal documentation     . "I want to have better controlled diabetes" (pt-stated)       Current Barriers:  . Diabetes: uncontrolled, most recent A1c 8.2% o Follows with Dr. Gabriel Carina, endocrinology; last visit 05/2019, visit upcoming 09/10/2019 o Patient interested in CGM   . Current antihyperglycemic regimen: Basaglar 50 units daily, Humalog 14/14/20 units TID w/ meals; Farxiga 10 mg daily, glimepiride 4 mg daily  o Notes that [place] and glimepiride were held on recent discharge after admission w/ AKI, but she has since restarted . Reports daytime hypoglycemia if she doesn't eat, which does happen often d/t significant complaints of nausea . Current glucose results: Fasting 77-100 . Cardiovascular risk reduction- cardiologist Dr. Fletcher Anon o Current hypertensive regimen:  metoprolol tartrate 100 mg BID, irbesartan 75 mg BID o Current hyperlipidemia regimen: none; hx intolerance to statins, see hyperlipidemia goal o Antiplatelet regimen: clopidogrel 75 mg daily, ASA 81 mg daily  Pharmacist Clinical Goal(s):  Marland Kitchen Over the next 90 days, patient with work with PharmD and primary care provider to address improved glucose management  Interventions: . Comprehensive medication review performed, medication list updated in electronic medical record . Discussed concern of glimepiride contributing to hypoglycemia during the day if she doesn't eat. Recommended she discuss discontinuation w/ Dr. Gabriel Carina; recommend insulin adjustment instead, as this can be modified based on anticipated carbohydrate intake . Unsure when she self-restarted Farxiga, but renal function is leveling out. Recommend continuation d/t concurrent DM, CKD , and CHF . Discussed that Medicare requires patient to be checking BG at least QID; encouraged patient to start doing this, especially in the few weeks prior to her appointment with Dr. Gabriel Carina, to pursue CGM coverage.   Patient Self Care Activities:  .  Patient will check blood glucose QID, document, and provide at future appointments . Patient will take medications as prescribed . Patient will report any questions or concerns to provider   Initial goal documentation          Plan:  - Will collaborate with primary care team for Repatha coverage as above - Will outreach patient in ~4-5 weeks for medication management support  Catie Darnelle Maffucci, PharmD Clinical Pharmacist [place] [phone]

## 2019-08-20 NOTE — Patient Instructions (Signed)
Visit Information  Goals Addressed            This Visit's Progress     Patient Stated   . "I don't want to have a heart attack" (pt-stated)       Current Barriers:  . Uncontrolled hyperlipidemia, complicated by CAD (NSTEMI, DES in 03/2014, DES again in 11/2018); cardiologist Dr. Fletcher Anon  . Current antihyperlipidemic regimen: none . Previous antihyperlipidemic medications tried: atorvastatin 20 mg daily and 40 mg daily; rosuvastatin 5 mg daily and 5 mg every other day; simvastatin 20 mg daily and 40 mg daily; all causing significant muscle pain/myalgias interrupting ADLs . Most recent lipid panel: LDL: 138, TG: 177, TC: 217, HDL: 47 . ASCVD risk enhancing conditions: DM, HTN, CKD, CHF  Pharmacist Clinical Goal(s):  Marland Kitchen Over the next 90 days, patient will work with PharmD and providers towards optimized antihyperlipidemic therapy  Interventions: . Comprehensive medication review performed; medication list updated in electronic medical record.  . Patient a candidate for PCSK9 therapy for secondary prevention of ASCVD. Recommend sending prescription for Repatha 140 mg SQ Q2 weeks; PA will likely be required. If approved, will see cost to see if patient can afford, should be affordable given Medicare/Medicaid dual coverage   Patient Self Care Activities:  . Patient will focus on medication adherence  Initial goal documentation     . "I want to have better controlled diabetes" (pt-stated)       Current Barriers:  . Diabetes: uncontrolled, most recent A1c 8.2% o Follows with Dr. Gabriel Carina, endocrinology; last visit 05/2019, visit upcoming 09/10/2019 o Patient interested in CGM   . Current antihyperglycemic regimen: Basaglar 50 units daily, Humalog 14/14/20 units TID w/ meals; Farxiga 10 mg daily, glimepiride 4 mg daily  o Notes that [place] and glimepiride were held on recent discharge after admission w/ AKI, but she has since restarted . Reports daytime hypoglycemia if she doesn't eat, which  does happen often d/t significant complaints of nausea . Current glucose results: Fasting 77-100 . Cardiovascular risk reduction- cardiologist Dr. Fletcher Anon o Current hypertensive regimen: metoprolol tartrate 100 mg BID, irbesartan 75 mg BID o Current hyperlipidemia regimen: none; hx intolerance to statins, see hyperlipidemia goal o Antiplatelet regimen: clopidogrel 75 mg daily, ASA 81 mg daily  Pharmacist Clinical Goal(s):  Marland Kitchen Over the next 90 days, patient with work with PharmD and primary care provider to address improved glucose management  Interventions: . Comprehensive medication review performed, medication list updated in electronic medical record . Discussed concern of glimepiride contributing to hypoglycemia during the day if she doesn't eat. Recommended she discuss discontinuation w/ Dr. Gabriel Carina; recommend insulin adjustment instead, as this can be modified based on anticipated carbohydrate intake . Unsure when she self-restarted Farxiga, but renal function is leveling out. Recommend continuation d/t concurrent DM, CKD , and CHF . Discussed that Medicare requires patient to be checking BG at least QID; encouraged patient to start doing this, especially in the few weeks prior to her appointment with Dr. Gabriel Carina, to pursue CGM coverage.   Patient Self Care Activities:  . Patient will check blood glucose QID, document, and provide at future appointments . Patient will take medications as prescribed . Patient will report any questions or concerns to provider   Initial goal documentation         The patient verbalized understanding of instructions provided today and declined a print copy of patient instruction materials.   Plan:  - Will collaborate with primary care team for Repatha coverage as  above - Will outreach patient in ~4-5 weeks for medication management support  Catie Darnelle Maffucci, PharmD Clinical Pharmacist [place] [phone]

## 2019-08-23 ENCOUNTER — Telehealth: Payer: Self-pay

## 2019-08-23 NOTE — Telephone Encounter (Signed)
Prior Authorization initiated via CoverMyMeds for Repatha SureClick 140MG /ML auto-injectors Key: [id number]

## 2019-08-24 NOTE — Telephone Encounter (Signed)
Will contact insurance to see why PA was denied in the first place; collaborate with [place] representative as needed for appeal letter

## 2019-08-24 NOTE — Telephone Encounter (Signed)
Will let Catie know here, so we can work on next process.

## 2019-08-24 NOTE — Telephone Encounter (Signed)
Catie, I have the appeal form with me.

## 2019-08-24 NOTE — Telephone Encounter (Signed)
PA was denied but an appeal has started. Jolene, could you write a letter stating why medication is necessary for the patient?

## 2019-08-25 ENCOUNTER — Ambulatory Visit: Payer: Self-pay | Admitting: Pharmacist

## 2019-08-25 ENCOUNTER — Other Ambulatory Visit: Payer: Self-pay | Admitting: Nurse Practitioner

## 2019-08-25 DIAGNOSIS — E1169 Type 2 diabetes mellitus with other specified complication: Secondary | ICD-10-CM

## 2019-08-25 DIAGNOSIS — I700 Atherosclerosis of aorta: Secondary | ICD-10-CM

## 2019-08-25 DIAGNOSIS — E785 Hyperlipidemia, unspecified: Secondary | ICD-10-CM

## 2019-08-25 MED ORDER — PRALUENT 75 MG/ML ~~LOC~~ SOAJ: 75.0000 mg | SUBCUTANEOUS | 2 refills | Status: AC

## 2019-08-25 NOTE — Progress Notes (Signed)
Refer to [facility] pharmacist noted for changes.

## 2019-08-25 NOTE — Chronic Care Management (AMB) (Signed)
Chronic Care Management   Follow Up Note   08/25/2019 Name: [name] [name] MRN: [id number] DOB: [birth year]  Referred by: Venita Lick, NP Reason for referral : Chronic Care Management (Medication Management)   [name] [name] is a 38 y.o. year old female who is a primary care patient of Cannady, Barbaraann Faster, NP. The CCM team was consulted for assistance with chronic disease management and care coordination needs.    Care coordination completed today.  Review of patient status, including review of consultants reports, relevant laboratory and other test results, and collaboration with appropriate care team members and the patient's provider was performed as part of comprehensive patient evaluation and provision of chronic care management services.    SDOH (Social Determinants of Health) screening performed today: Financial Strain . See Care Plan for related entries.   Outpatient Encounter Medications as of 08/25/2019  Medication Sig Note  . ACCU-CHEK AVIVA PLUS test strip USE 1 STRIP TO CHECK GLUCOSE THREE TIMES DAILY   . amitriptyline (ELAVIL) 25 MG tablet Take 25 mg by mouth at bedtime.   Marland Kitchen aspirin EC 81 MG tablet Take 81 mg by mouth daily.   . Cholecalciferol 2000 units TABS Take by mouth daily.    . clopidogrel (PLAVIX) 75 MG tablet Take 1 tablet (75 mg total) by mouth daily.   . dapagliflozin propanediol (FARXIGA) 10 MG TABS tablet Take 10 mg by mouth daily.   . DULoxetine (CYMBALTA) 30 MG capsule Take 60 mg by mouth daily.    Marland Kitchen etonogestrel (NEXPLANON) 68 MG IMPL implant 1 each by Subdermal route once.   . Evolocumab (REPATHA SURECLICK) 017 MG/ML SOAJ Inject 140 mg into the skin every 14 (fourteen) days.   . furosemide (LASIX) 40 MG tablet Take 1 tablet (40 mg total) by mouth every other day. 06/18/2019: Takes as needed  . glimepiride (AMARYL) 4 MG tablet Take 4 mg by mouth daily with breakfast.   . insulin aspart (NOVOLOG) 100 UNIT/ML injection Inject 10 Units into the skin 3  (three) times daily with meals. 08/20/2019: 14/14/20  . Insulin Glargine (BASAGLAR KWIKPEN) 100 UNIT/ML SOPN Inject 0.3 mLs (30 Units total) into the skin daily. 08/20/2019: 50 units   . irbesartan (AVAPRO) 75 MG tablet Take 75 mg by mouth daily.   . isosorbide mononitrate (IMDUR) 30 MG 24 hr tablet Take 2 tablets (60 mg total) by mouth daily.   Marland Kitchen lactulose (CHRONULAC) 10 GM/15ML solution Take 45 mLs (30 g total) by mouth daily as needed for severe constipation. 08/20/2019: Daily   . meclizine (ANTIVERT) 25 MG tablet Take 25 mg by mouth 3 (three) times daily as needed for dizziness.   . metoCLOPramide (REGLAN) 10 MG tablet Take 1 tablet (10 mg total) by mouth 4 (four) times daily -  before meals and at bedtime.   . metoprolol tartrate (LOPRESSOR) 100 MG tablet Take 1 tablet (100 mg total) by mouth 2 (two) times daily.   . mirtazapine (REMERON) 30 MG tablet Take 2 tablets (60 mg total) by mouth at bedtime.   . nitroGLYCERIN (NITROSTAT) 0.4 MG SL tablet Take 1 tab under your tongue while sitting for chest pain, If no relief may repeat, one tab every 5 min up to 3 tabs total over 15 mins. (Patient not taking: Reported on 08/20/2019)   . pantoprazole (PROTONIX) 40 MG tablet Take 1 tablet (40 mg total) by mouth 2 (two) times daily.   . prochlorperazine (COMPAZINE) 5 MG tablet TAKE 1 TABLET BY MOUTH EVERY 6 HOURS  AS NEEDED FOR NAUSEA AND VOMITING   . promethazine (PHENERGAN) 12.5 MG tablet Take 1 tablet (12.5 mg total) by mouth every 6 (six) hours as needed for nausea or vomiting.   . promethazine (PHENERGAN) 25 MG suppository Place 1 suppository (25 mg total) rectally every 6 (six) hours as needed for nausea or vomiting.   . senna-docusate (SENOKOT-S) 8.6-50 MG tablet Take 1 tablet by mouth 2 (two) times daily.    No facility-administered encounter medications on file as of 08/25/2019.      Goals Addressed            This Visit's Progress     Patient Stated   . "I don't want to have a heart attack"  (pt-stated)       Current Barriers:  . Uncontrolled hyperlipidemia, complicated by CAD (NSTEMI, DES in 03/2014, DES again in 11/2018); cardiologist Dr. Salley Slaughter Submitted prescription and PA for [place] - PA denied  . Current antihyperlipidemic regimen: none . Previous antihyperlipidemic medications tried: atorvastatin 20 mg daily and 40 mg daily; rosuvastatin 5 mg daily and 5 mg every other day; simvastatin 20 mg daily and 40 mg daily; all causing significant muscle pain/myalgias interrupting ADLs . Most recent lipid panel: LDL: 138, TG: 177, TC: 217, HDL: 47 . ASCVD risk enhancing conditions: DM, HTN, CKD, CHF  Pharmacist Clinical Goal(s):  Marland Kitchen Over the next 90 days, patient will work with PharmD and providers towards optimized antihyperlipidemic therapy  Interventions: . [place] appeals department. PA was denied as Praluent is preferred therapy. Recommend d/c order for Repatha and send for Praluent 75 mg Q14 days, 6 pens for 84 day supply. Will collaborate with Marnee Guarneri, NP on this.   Patient Self Care Activities:  . Patient will focus on medication adherence  Please see past updates related to this goal by clicking on the "Past Updates" button in the selected goal          Plan:  - Will continue to collaborate with primary care provider regarding medication access  Catie Darnelle Maffucci, PharmD Clinical Pharmacist [place] [phone]

## 2019-08-25 NOTE — Patient Instructions (Signed)
Visit Information  Goals Addressed            This Visit's Progress     Patient Stated   . "I don't want to have a heart attack" (pt-stated)       Current Barriers:  . Uncontrolled hyperlipidemia, complicated by CAD (NSTEMI, DES in 03/2014, DES again in 11/2018); cardiologist Dr. Salley Slaughter Submitted prescription and PA for [place]atha - PA denied  . Current antihyperlipidemic regimen: none . Previous antihyperlipidemic medications tried: atorvastatin 20 mg daily and 40 mg daily; rosuvastatin 5 mg daily and 5 mg every other day; simvastatin 20 mg daily and 40 mg daily; all causing significant muscle pain/myalgias interrupting ADLs . Most recent lipid panel: LDL: 138, TG: 177, TC: 217, HDL: 47 . ASCVD risk enhancing conditions: DM, HTN, CKD, CHF  Pharmacist Clinical Goal(s):  Marland Kitchen Over the next 90 days, patient will work with PharmD and providers towards optimized antihyperlipidemic therapy  Interventions: . [place] appeals department. PA was denied as Praluent is preferred therapy. Recommend d/c order for Repatha and send for Praluent 75 mg Q14 days, 6 pens for 84 day supply. Will collaborate with Marnee Guarneri, NP on this.   Patient Self Care Activities:  . Patient will focus on medication adherence  Please see past updates related to this goal by clicking on the "Past Updates" button in the selected goal         The patient verbalized understanding of instructions provided today and declined a print copy of patient instruction materials.   Plan:  - Will continue to collaborate with primary care provider regarding medication access  Catie Darnelle Maffucci, PharmD Clinical Pharmacist [place] [phone]

## 2019-08-26 ENCOUNTER — Ambulatory Visit: Payer: Self-pay | Admitting: Pharmacist

## 2019-08-26 DIAGNOSIS — E1169 Type 2 diabetes mellitus with other specified complication: Secondary | ICD-10-CM

## 2019-08-26 NOTE — Chronic Care Management (AMB) (Signed)
Chronic Care Management   Follow Up Note   08/26/2019 Name: [name] [name] MRN: [id number] DOB: [birth year]  Referred by: Venita Lick, NP Reason for referral : Chronic Care Management (Medication Management)   [name] [name] is a 38 y.o. year old female who is a primary care patient of Cannady, Barbaraann Faster, NP. The CCM team was consulted for assistance with chronic disease management and care coordination needs.    Care coordination completed today.   Review of patient status, including review of consultants reports, relevant laboratory and other test results, and collaboration with appropriate care team members and the patient's provider was performed as part of comprehensive patient evaluation and provision of chronic care management services.    SDOH (Social Determinants of Health) screening performed today: Financial Strain . See Care Plan for related entries.   Advanced Directives Status: N See Care Plan and [facility] application for related entries.  Outpatient Encounter Medications as of 08/26/2019  Medication Sig Note  . ACCU-CHEK AVIVA PLUS test strip USE 1 STRIP TO CHECK GLUCOSE THREE TIMES DAILY   . Alirocumab (PRALUENT) 75 MG/ML SOAJ Inject 75 mg into the skin every 14 (fourteen) days.   Marland Kitchen amitriptyline (ELAVIL) 25 MG tablet Take 25 mg by mouth at bedtime.   Marland Kitchen aspirin EC 81 MG tablet Take 81 mg by mouth daily.   . Cholecalciferol 2000 units TABS Take by mouth daily.    . clopidogrel (PLAVIX) 75 MG tablet Take 1 tablet (75 mg total) by mouth daily.   . dapagliflozin propanediol (FARXIGA) 10 MG TABS tablet Take 10 mg by mouth daily.   . DULoxetine (CYMBALTA) 30 MG capsule Take 60 mg by mouth daily.    Marland Kitchen etonogestrel (NEXPLANON) 68 MG IMPL implant 1 each by Subdermal route once.   . furosemide (LASIX) 40 MG tablet Take 1 tablet (40 mg total) by mouth every other day. 06/18/2019: Takes as needed  . glimepiride (AMARYL) 4 MG tablet Take 4 mg by mouth daily with breakfast.   .  insulin aspart (NOVOLOG) 100 UNIT/ML injection Inject 10 Units into the skin 3 (three) times daily with meals. 08/20/2019: 14/14/20  . Insulin Glargine (BASAGLAR KWIKPEN) 100 UNIT/ML SOPN Inject 0.3 mLs (30 Units total) into the skin daily. 08/20/2019: 50 units   . irbesartan (AVAPRO) 75 MG tablet Take 75 mg by mouth daily.   . isosorbide mononitrate (IMDUR) 30 MG 24 hr tablet Take 2 tablets (60 mg total) by mouth daily.   Marland Kitchen lactulose (CHRONULAC) 10 GM/15ML solution Take 45 mLs (30 g total) by mouth daily as needed for severe constipation. 08/20/2019: Daily   . meclizine (ANTIVERT) 25 MG tablet Take 25 mg by mouth 3 (three) times daily as needed for dizziness.   . metoCLOPramide (REGLAN) 10 MG tablet Take 1 tablet (10 mg total) by mouth 4 (four) times daily -  before meals and at bedtime.   . metoprolol tartrate (LOPRESSOR) 100 MG tablet Take 1 tablet (100 mg total) by mouth 2 (two) times daily.   . mirtazapine (REMERON) 30 MG tablet Take 2 tablets (60 mg total) by mouth at bedtime.   . nitroGLYCERIN (NITROSTAT) 0.4 MG SL tablet Take 1 tab under your tongue while sitting for chest pain, If no relief may repeat, one tab every 5 min up to 3 tabs total over 15 mins. (Patient not taking: Reported on 08/20/2019)   . pantoprazole (PROTONIX) 40 MG tablet Take 1 tablet (40 mg total) by mouth 2 (two) times daily.   Marland Kitchen  prochlorperazine (COMPAZINE) 5 MG tablet TAKE 1 TABLET BY MOUTH EVERY 6 HOURS AS NEEDED FOR NAUSEA AND VOMITING   . promethazine (PHENERGAN) 12.5 MG tablet Take 1 tablet (12.5 mg total) by mouth every 6 (six) hours as needed for nausea or vomiting.   . promethazine (PHENERGAN) 25 MG suppository Place 1 suppository (25 mg total) rectally every 6 (six) hours as needed for nausea or vomiting.   . senna-docusate (SENOKOT-S) 8.6-50 MG tablet Take 1 tablet by mouth 2 (two) times daily.    No facility-administered encounter medications on file as of 08/26/2019.      Goals Addressed            This  Visit's Progress     Patient Stated   . "I don't want to have a heart attack" (pt-stated)       Current Barriers:  . Uncontrolled hyperlipidemia, complicated by CAD (NSTEMI, DES in 03/2014, DES again in 11/2018); cardiologist Dr. Salley Slaughter Submitted prescription and PA for [place] - PA denied  o [place] preferred  . Current antihyperlipidemic regimen: none . Previous antihyperlipidemic medications tried: atorvastatin 20 mg daily and 40 mg daily; rosuvastatin 5 mg daily and 5 mg every other day; simvastatin 20 mg daily and 40 mg daily; all causing significant muscle pain/myalgias interrupting ADLs . Most recent lipid panel: LDL: 138, TG: 177, TC: 217, HDL: 47 . ASCVD risk enhancing conditions: DM, HTN, CKD, CHF  Pharmacist Clinical Goal(s):  Marland Kitchen Over the next 90 days, patient will work with PharmD and providers towards optimized antihyperlipidemic therapy  Interventions: . [place] - they noted the Praluent was covered and showing a $0 copay. They are out of stock; anticipate getting in order tomorrow and being able to fill.  . Contacted patient; left HIPAA compliant message letting her know the above and requesting she contact me when she has picked the medication up.   Patient Self Care Activities:  . Patient will focus on medication adherence  Please see past updates related to this goal by clicking on the "Past Updates" button in the selected goal          Plan:  - Have asked patient to let me know when she picks up the medication. Will plan to outreach patient in the next ~2-3 weeks to follow up on medication management needs.  Catie Darnelle Maffucci, PharmD Clinical Pharmacist [place] [phone]

## 2019-08-26 NOTE — Patient Instructions (Signed)
Visit Information  Goals Addressed            This Visit's Progress     Patient Stated   . "I don't want to have a heart attack" (pt-stated)       Current Barriers:  . Uncontrolled hyperlipidemia, complicated by CAD (NSTEMI, DES in 03/2014, DES again in 11/2018); cardiologist Dr. Salley Slaughter Submitted prescription and PA for [place] - PA denied  o [place] preferred  . Current antihyperlipidemic regimen: none . Previous antihyperlipidemic medications tried: atorvastatin 20 mg daily and 40 mg daily; rosuvastatin 5 mg daily and 5 mg every other day; simvastatin 20 mg daily and 40 mg daily; all causing significant muscle pain/myalgias interrupting ADLs . Most recent lipid panel: LDL: 138, TG: 177, TC: 217, HDL: 47 . ASCVD risk enhancing conditions: DM, HTN, CKD, CHF  Pharmacist Clinical Goal(s):  Marland Kitchen Over the next 90 days, patient will work with PharmD and providers towards optimized antihyperlipidemic therapy  Interventions: . [place] - they noted the Praluent was covered and showing a $0 copay. They are out of stock; anticipate getting in order tomorrow and being able to fill.  . Contacted patient; left HIPAA compliant message letting her know the above and requesting she contact me when she has picked the medication up.   Patient Self Care Activities:  . Patient will focus on medication adherence  Please see past updates related to this goal by clicking on the "Past Updates" button in the selected goal         The patient verbalized understanding of instructions provided today and declined a print copy of patient instruction materials.   Plan:  - Have asked patient to let me know when she picks up the medication. Will plan to outreach patient in the next ~2-3 weeks to follow up on medication management needs.  Catie Darnelle Maffucci, PharmD Clinical Pharmacist [place] [phone]

## 2019-09-03 ENCOUNTER — Ambulatory Visit: Payer: Self-pay | Admitting: Pharmacist

## 2019-09-03 DIAGNOSIS — E1159 Type 2 diabetes mellitus with other circulatory complications: Secondary | ICD-10-CM | POA: Diagnosis not present

## 2019-09-03 NOTE — Telephone Encounter (Signed)
Barnetta Chapel,  Is it okay to close this encounter or do you still need it open?

## 2019-09-03 NOTE — Chronic Care Management (AMB) (Signed)
  Chronic Care Management   Note  09/03/2019 Name: [name] [name] MRN: [id number] DOB: 1981-07-20  [name] [name] is a 38 y.o. year old female who is a primary care patient of Cannady, Barbaraann Faster, NP. The CCM team was consulted for assistance with chronic disease management and care coordination needs.    Received call back from patient. She noted that she has not had a chance to pick up Praluent yet, but plans to do so this weekend. I recommended she ask the pharmacist to demonstrate injection technique for her; however, if she has further questions to contact me and we could talk through her injection over the phone/FaceTime, or she could come by the office on a Tuesday/Wed that I am there for teaching.   Follow up plan: - Will outreach patient in ~2-3 weeks as previously scheduled for medication management support  Catie Darnelle Maffucci, PharmD Clinical Pharmacist [place] [phone]

## 2019-09-03 NOTE — Chronic Care Management (AMB) (Signed)
  Chronic Care Management   Note  09/03/2019 Name: [name] [name] MRN: [id number] DOB: 09-Feb-1981  [name] [name] is a 38 y.o. year old female who is a primary care patient of Cannady, Barbaraann Faster, NP. The CCM team was consulted for assistance with chronic disease management and care coordination needs.    Attempted to contact patient to discuss if she was able to pick up Praluent. Also noted that she messaged cardiology for help with injection technique; will plan to either talk patient through injection technique or have her meet me at clinic on a day I am there.   Follow up plan: - Will await callback from patient for Praluent demonstration.   Catie Darnelle Maffucci, PharmD Clinical Pharmacist [place] [phone]

## 2019-09-03 NOTE — Telephone Encounter (Signed)
Yes! Patient has gotten Praluent instead.   [name] [name]

## 2019-09-10 DIAGNOSIS — I10 Essential (primary) hypertension: Secondary | ICD-10-CM | POA: Diagnosis not present

## 2019-09-10 DIAGNOSIS — E1169 Type 2 diabetes mellitus with other specified complication: Secondary | ICD-10-CM | POA: Diagnosis not present

## 2019-09-10 DIAGNOSIS — E1143 Type 2 diabetes mellitus with diabetic autonomic (poly)neuropathy: Secondary | ICD-10-CM | POA: Diagnosis not present

## 2019-09-10 DIAGNOSIS — R809 Proteinuria, unspecified: Secondary | ICD-10-CM | POA: Diagnosis not present

## 2019-09-10 DIAGNOSIS — E1159 Type 2 diabetes mellitus with other circulatory complications: Secondary | ICD-10-CM | POA: Diagnosis not present

## 2019-09-10 DIAGNOSIS — E782 Mixed hyperlipidemia: Secondary | ICD-10-CM | POA: Diagnosis not present

## 2019-09-10 DIAGNOSIS — Z794 Long term (current) use of insulin: Secondary | ICD-10-CM | POA: Diagnosis not present

## 2019-09-10 DIAGNOSIS — E1165 Type 2 diabetes mellitus with hyperglycemia: Secondary | ICD-10-CM | POA: Diagnosis not present

## 2019-09-10 DIAGNOSIS — E669 Obesity, unspecified: Secondary | ICD-10-CM | POA: Diagnosis not present

## 2019-09-17 ENCOUNTER — Telehealth: Payer: Self-pay

## 2019-09-17 ENCOUNTER — Ambulatory Visit: Payer: Self-pay | Admitting: Pharmacist

## 2019-09-17 NOTE — Chronic Care Management (AMB) (Signed)
  Chronic Care Management   Note  09/17/2019 Name: [name] MRN: [id number] DOB: 1981/07/17  [name] [name] is a 38 y.o. year old female who is a primary care patient of Cannady, Barbaraann Faster, NP. The CCM team was consulted for assistance with chronic disease management and care coordination needs.    Attempted to contact patient for medication management review. Left HIPAA compliant message for patient to return my call at her convenience.   Follow up plan: - If I do not hear back, will outreach again in the next 4-6 weeks  Catie Darnelle Maffucci, PharmD Clinical Pharmacist [place] [phone]

## 2019-10-26 ENCOUNTER — Ambulatory Visit: Payer: Self-pay | Admitting: Pharmacist

## 2019-10-26 ENCOUNTER — Telehealth: Payer: Self-pay

## 2019-10-26 NOTE — Chronic Care Management (AMB) (Signed)
  Chronic Care Management   Note  10/26/2019 Name: Vida Nicol MRN: [id number] DOB: 13-Oct-1981  Yazaira Speas is a 38 y.o. year old female who is a primary care patient of Cannady, Barbaraann Faster, NP. The CCM team was consulted for assistance with chronic disease management and care coordination needs.    Attempted to contact patient to discuss medication management needs. Left HIPAA compliant message for her to return my call at her convenience   Follow up plan: - If I do not hear back, will follow up with her next month when she is in office for PCP appointment.   Catie Darnelle Maffucci, PharmD Clinical Pharmacist [place] [phone]

## 2019-11-11 DIAGNOSIS — R801 Persistent proteinuria, unspecified: Secondary | ICD-10-CM | POA: Diagnosis not present

## 2019-11-11 DIAGNOSIS — R82998 Other abnormal findings in urine: Secondary | ICD-10-CM | POA: Diagnosis not present

## 2019-11-11 DIAGNOSIS — I10 Essential (primary) hypertension: Secondary | ICD-10-CM | POA: Diagnosis not present

## 2019-11-11 DIAGNOSIS — N182 Chronic kidney disease, stage 2 (mild): Secondary | ICD-10-CM | POA: Diagnosis not present

## 2019-11-11 DIAGNOSIS — E1129 Type 2 diabetes mellitus with other diabetic kidney complication: Secondary | ICD-10-CM | POA: Diagnosis not present

## 2019-11-17 ENCOUNTER — Ambulatory Visit: Payer: Self-pay | Admitting: Pharmacist

## 2019-11-17 ENCOUNTER — Telehealth: Payer: Self-pay

## 2019-11-17 ENCOUNTER — Ambulatory Visit: Payer: Medicare Other | Admitting: Nurse Practitioner

## 2019-11-17 NOTE — Chronic Care Management (AMB) (Signed)
  Chronic Care Management   Note  11/17/2019 Name: [name] [name] MRN: [id number] DOB: January 20, 1981  [name] [name] is a 38 y.o. year old female who is a primary care patient of Cannady, Barbaraann Faster, NP. The CCM team was consulted for assistance with chronic disease management and care coordination needs.    Planned to meet with patient face to face at appointment w/ Marnee Guarneri, NP today. Patient no-showed this appointment. I attempted to contact telephonically for medication management review, however, had to leave a voicemail.   This is the third consecutive unsuccessful outreach attempt from [facility] team member. I would be happy to attempt to re-engage with the patient if she returns my call or with a new referral from the PCP.   Catie Darnelle Maffucci, PharmD, [place] [phone]

## 2019-11-20 ENCOUNTER — Emergency Department
Admission: EM | Admit: 2019-11-20 | Discharge: 2019-11-20 | Disposition: A | Discharge status: Home / Self Care | Payer: Medicare Other | Attending: Emergency Medicine | Admitting: Emergency Medicine

## 2019-11-20 ENCOUNTER — Other Ambulatory Visit: Payer: Self-pay

## 2019-11-20 DIAGNOSIS — E1122 Type 2 diabetes mellitus with diabetic chronic kidney disease: Secondary | ICD-10-CM | POA: Diagnosis not present

## 2019-11-20 DIAGNOSIS — I130 Hypertensive heart and chronic kidney disease with heart failure and stage 1 through stage 4 chronic kidney disease, or unspecified chronic kidney disease: Secondary | ICD-10-CM | POA: Insufficient documentation

## 2019-11-20 DIAGNOSIS — Z7902 Long term (current) use of antithrombotics/antiplatelets: Secondary | ICD-10-CM | POA: Insufficient documentation

## 2019-11-20 DIAGNOSIS — Z794 Long term (current) use of insulin: Secondary | ICD-10-CM | POA: Insufficient documentation

## 2019-11-20 DIAGNOSIS — N183 Chronic kidney disease, stage 3 unspecified: Secondary | ICD-10-CM | POA: Diagnosis not present

## 2019-11-20 DIAGNOSIS — I2510 Atherosclerotic heart disease of native coronary artery without angina pectoris: Secondary | ICD-10-CM | POA: Diagnosis not present

## 2019-11-20 DIAGNOSIS — R112 Nausea with vomiting, unspecified: Secondary | ICD-10-CM

## 2019-11-20 DIAGNOSIS — Z7982 Long term (current) use of aspirin: Secondary | ICD-10-CM | POA: Diagnosis not present

## 2019-11-20 DIAGNOSIS — I5032 Chronic diastolic (congestive) heart failure: Secondary | ICD-10-CM | POA: Diagnosis not present

## 2019-11-20 DIAGNOSIS — K3184 Gastroparesis: Secondary | ICD-10-CM | POA: Diagnosis not present

## 2019-11-20 DIAGNOSIS — Z87891 Personal history of nicotine dependence: Secondary | ICD-10-CM | POA: Diagnosis not present

## 2019-11-20 DIAGNOSIS — Z79899 Other long term (current) drug therapy: Secondary | ICD-10-CM | POA: Diagnosis not present

## 2019-11-20 DIAGNOSIS — I10 Essential (primary) hypertension: Secondary | ICD-10-CM

## 2019-11-20 LAB — URINALYSIS, COMPLETE (UACMP) WITH MICROSCOPIC
Bilirubin Urine: NEGATIVE
Glucose, UA: >=500 mg/dL — AB
Ketones, ur: 5 mg/dL — AB
Nitrite: NEGATIVE
Protein, ur: >=300 mg/dL — AB
Specific Gravity, Urine: 1.02 (ref 1.005–1.030)
WBC, UA: >50 WBC/hpf — ABNORMAL HIGH (ref 0–5)
pH: 6 (ref 5.0–8.0)

## 2019-11-20 LAB — CBC WITH DIFFERENTIAL/PLATELET
Abs Immature Granulocytes: 0.05 10*3/uL (ref 0.00–0.07)
Basophils Absolute: 0 10*3/uL (ref 0.0–0.1)
Basophils Relative: 0 %
Eosinophils Absolute: 0 10*3/uL (ref 0.0–0.5)
Eosinophils Relative: 0 %
HCT: 41.9 % (ref 36.0–46.0)
Hemoglobin: 13.3 g/dL (ref 12.0–15.0)
Immature Granulocytes: 0 %
Lymphocytes Relative: 9 %
Lymphs Abs: 1.1 10*3/uL (ref 0.7–4.0)
MCH: 26.3 pg (ref 26.0–34.0)
MCHC: 31.7 g/dL (ref 30.0–36.0)
MCV: 82.8 fL (ref 80.0–100.0)
Monocytes Absolute: 0.2 10*3/uL (ref 0.1–1.0)
Monocytes Relative: 2 %
Neutro Abs: 10.3 10*3/uL — ABNORMAL HIGH (ref 1.7–7.7)
Neutrophils Relative %: 89 %
Platelets: 452 10*3/uL — ABNORMAL HIGH (ref 150–400)
RBC: 5.06 MIL/uL (ref 3.87–5.11)
RDW: 14.2 % (ref 11.5–15.5)
WBC: 11.6 10*3/uL — ABNORMAL HIGH (ref 4.0–10.5)
nRBC: 0 % (ref 0.0–0.2)

## 2019-11-20 LAB — POCT PREGNANCY, URINE: Preg Test, Ur: NEGATIVE

## 2019-11-20 LAB — COMPREHENSIVE METABOLIC PANEL
ALT: 15 U/L (ref 0–44)
AST: 20 U/L (ref 15–41)
Albumin: 3.4 g/dL — ABNORMAL LOW (ref 3.5–5.0)
Alkaline Phosphatase: 111 U/L (ref 38–126)
Anion gap: 12 (ref 5–15)
BUN: 18 mg/dL (ref 6–20)
CO2: 18 mmol/L — ABNORMAL LOW (ref 22–32)
Calcium: 9.1 mg/dL (ref 8.9–10.3)
Chloride: 107 mmol/L (ref 98–111)
Creatinine, Ser: 1.47 mg/dL — ABNORMAL HIGH (ref 0.44–1.00)
GFR calc Af Amer: 52 mL/min — ABNORMAL LOW (ref >60)
GFR calc non Af Amer: 45 mL/min — ABNORMAL LOW (ref >60)
Glucose, Bld: 287 mg/dL — ABNORMAL HIGH (ref 70–99)
Potassium: 4 mmol/L (ref 3.5–5.1)
Sodium: 137 mmol/L (ref 135–145)
Total Bilirubin: 0.7 mg/dL (ref 0.3–1.2)
Total Protein: 8.1 g/dL (ref 6.5–8.1)

## 2019-11-20 LAB — LIPASE, BLOOD: Lipase: 22 U/L (ref 11–51)

## 2019-11-20 MED ORDER — SODIUM CHLORIDE 0.9 % IV SOLN
1.0000 g | Freq: Once | INTRAVENOUS | Status: AC
  Administered 2019-11-20: 1 g via INTRAVENOUS
  Filled 2019-11-20: qty 10

## 2019-11-20 MED ORDER — LACTATED RINGERS IV BOLUS
1000.0000 mL | Freq: Once | INTRAVENOUS | Status: AC
  Administered 2019-11-20: 10:00:00 1000 mL via INTRAVENOUS

## 2019-11-20 MED ORDER — MORPHINE SULFATE (PF) 4 MG/ML IV SOLN: 4.0000 mg | Freq: Once | INTRAVENOUS | Status: DC

## 2019-11-20 MED ORDER — LABETALOL HCL 5 MG/ML IV SOLN
20.0000 mg | Freq: Once | INTRAVENOUS | Status: AC
  Administered 2019-11-20: 20 mg via INTRAVENOUS
  Filled 2019-11-20: qty 4

## 2019-11-20 MED ORDER — DROPERIDOL 2.5 MG/ML IJ SOLN
2.5000 mg | Freq: Once | INTRAMUSCULAR | Status: AC
  Administered 2019-11-20: 10:00:00 2.5 mg via INTRAVENOUS

## 2019-11-20 MED ORDER — MORPHINE SULFATE (PF) 4 MG/ML IV SOLN
4.0000 mg | Freq: Once | INTRAVENOUS | Status: AC
  Administered 2019-11-20: 4 mg via INTRAVENOUS
  Filled 2019-11-20: qty 1

## 2019-11-20 MED ORDER — METOPROLOL TARTRATE 50 MG PO TABS
100.0000 mg | ORAL_TABLET | Freq: Two times a day (BID) | ORAL | Status: DC
  Administered 2019-11-20: 10:00:00 100 mg via ORAL
  Filled 2019-11-20: qty 2

## 2019-11-20 MED ORDER — PROMETHAZINE HCL 25 MG/ML IJ SOLN: 12.5000 mg | Freq: Once | INTRAMUSCULAR | Status: DC

## 2019-11-20 MED ORDER — DROPERIDOL 2.5 MG/ML IJ SOLN
2.5000 mg | Freq: Once | INTRAMUSCULAR | Status: DC
  Filled 2019-11-20: qty 2

## 2019-11-20 NOTE — ED Notes (Signed)
Topez not working 

## 2019-11-20 NOTE — ED Triage Notes (Signed)
Pt arrives from home via [facility] with complaint of bilateral flank pain starting earlier today. Pt complains of painful urination. Pt reports abdominal pain for the past week. She reports she has seen her doctor for gallstones and they said surgery was not indicated at this time. Last meal was earlier today, but was thrown up. Pt is ambulatory and A&O x4 on arrival with dry heaves

## 2019-11-20 NOTE — ED Provider Notes (Signed)
[facility] Emergency Department Provider Note   ____________________________________________   First MD Initiated Contact with Patient 11/20/19 0700     (approximate)  I have reviewed the triage vital signs and the nursing notes.   HISTORY  Chief Complaint Abdominal Pain    HPI [name] [name] is a 38 y.o. female with past medical history of CAD, hypertension, diabetes, CKD, CHF presents to the ED for abdominal pain.  Patient reports 1 to 2 days of worsening epigastric pain with nausea and vomiting as well as diarrhea.  She has dealt with similar symptoms in the past and has been diagnosed with both cannabinoid hyperemesis and gastroparesis.  She describes the epigastric pain as constant and sharp, not exacerbated or alleviated by anything.  She denies any recent fevers, cough, chest pain, shortness of breath, dysuria, hematuria, or flank pain.  She states she does not get her period due to the birth control that she takes.  She admits to ongoing marijuana use, smoking about 2 times per week, but states that when she stops she continues to have the nausea and vomiting episodes.        Past Medical History:  Diagnosis Date  . CAD (coronary artery disease)    a. 03/2015 NSTEMI/PCI: LCX 177m (DES), OM1 100p (DES - vessel labeled Ramus in subsequent cath report). Minor irregs to LAD/RCA; b. 04/2015 Cath: LM nl, LAD 40p, RI patent stent, LCX patent stent, RCA nl, EF 55-65%; c. 02/2016 MV: basal inferolateral and mid inferolateral defect/infarct w/o ischemia. EF 55-65%; d. 11/2018 PCI: LM 30ost, LAD 30p, LCX 95p/m (PTCA->20%), OM1 10ost, OM2 40ost, RCA 85p.  Marland Kitchen Chronic abdominal pain   . Chronic diastolic (congestive) heart failure ([place])    a. 03/2015 Echo: EF 30-35%, mild concentric LVH, severe HK of inf, inflat, & lat walls, mod MR, mild TR;  b. 04/2015 LV Gram: EF 55-65%; c. 09/2017 Echo: EF 55-60%, no rwma. Mild MR; d. 11/2018 Echo: Ef 60-65%, no rwma.  . CKD (chronic  kidney disease), stage III   . Diabetes type 2, controlled ([place])    a. since 2002   . Diabetic gastroparesis (HCC)    a. chronic nausea/vomiting.  . Diverticulosis, sigmoid 07/2016  . Heavy menses    a. H/O IUD - expired in 2014 - remains in place.  Marland Kitchen Hx of migraines   . Hyperlipidemia    Intolerant of atorvastatin, rosuvastatin  . Hypertension   . Iron deficiency anemia   . Ischemic cardiomyopathy (resolved)    a. 03/2015 EF 30-35% post NSTEMI;  b. 04/2015 EF 55-65% on LV gram; c. 09/2017 Echo: EF 55-60%; d. 11/2018 Echo: Ef 60-65%.  . Obesity   . Tobacco abuse    a. quit 03/2015.  Marland Kitchen Uterine fibroid   . Vertigo   . Vitamin D deficiency     Patient Active Problem List   Diagnosis Date Noted  . Atherosclerosis of aorta ([place]) 08/04/2019  . Fatty liver 08/04/2019  . AKI (acute kidney injury) ([place]) 07/22/2019  . Anemia 01/14/2019  . Stable angina ([place]) 01/12/2019  . Thrombocytosis ([place]) 01/05/2019  . Coronary artery disease of native artery of native heart with stable angina pectoris ([place]) 12/17/2018  . Unstable angina ([place]) 12/04/2018  . Diabetic gastroparesis associated with type 2 diabetes mellitus ([place]) 05/02/2018  . CKD stage 3 due to type 2 diabetes mellitus ([place]) 04/06/2018  . Poorly controlled type 2 diabetes mellitus ([place]) 08/27/2017  . Lung nodule < 6cm on CT 08/12/2017  .  Depression, major, single episode, severe ([place]) 08/12/2017  . Chronic abdominal pain 08/03/2017  . Obstructive sleep apnea 05/16/2017  . Neuropathy 01/17/2017  . Vitamin D deficiency 10/25/2016  . Insomnia 10/25/2016  . 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 Elevated liver function tests 06/05/2016  . Chronic diastolic heart failure ([place]) 03/19/2016  . Proteinuria 08/30/2015  . CAD in native artery   . Angina pectoris ([place])   . Iron deficiency anemia   . Hyperlipidemia associated with type 2 diabetes mellitus ([place]) 04/13/2015  . Ischemic  cardiomyopathy   . Obesity     Past Surgical History:  Procedure Laterality Date  . APPENDECTOMY    . CARDIAC CATHETERIZATION  4/16   x2 stent [facility]  . CARDIAC CATHETERIZATION N/A 05/05/2015   Procedure: Left Heart Cath and Coronary Angiography;  Surgeon: Peter M Martinique, MD;  Location: [place] [facility];  Service: Cardiovascular;  Laterality: N/A;  . COLONOSCOPY WITH PROPOFOL N/A 07/31/2016   Procedure: COLONOSCOPY WITH PROPOFOL;  Surgeon: Lollie Sails, MD;  Location: [facility] ENDOSCOPY;  Service: Endoscopy;  Laterality: N/A;  . CORONARY BALLOON ANGIOPLASTY N/A 12/04/2018   Procedure: CORONARY BALLOON ANGIOPLASTY;  Surgeon: Wellington Hampshire, MD;  Location: [place] [facility];  Service: Cardiovascular;  Laterality: N/A;  . CORONARY STENT INTERVENTION N/A 01/12/2019   Procedure: CORONARY STENT INTERVENTION;  Surgeon: [name], MD;  Location: [place] [facility];  Service: Cardiovascular;  Laterality: N/A;  . ESOPHAGOGASTRODUODENOSCOPY (EGD) WITH PROPOFOL N/A 07/31/2016   Procedure: ESOPHAGOGASTRODUODENOSCOPY (EGD) WITH PROPOFOL;  Surgeon: Lollie Sails, MD;  Location: [facility] ENDOSCOPY;  Service: Endoscopy;  Laterality: N/A;  . INTRAVASCULAR PRESSURE WIRE/FFR STUDY N/A 12/04/2018   Procedure: INTRAVASCULAR PRESSURE WIRE/FFR STUDY;  Surgeon: Wellington Hampshire, MD;  Location: [place] [facility];  Service: Cardiovascular;  Laterality: N/A;  . LAPAROSCOPIC APPENDECTOMY N/A 08/07/2016   Procedure: APPENDECTOMY LAPAROSCOPIC;  Surgeon: Hubbard Robinson, MD;  Location: [facility] ORS;  Service: General;  Laterality: N/A;  . LEFT HEART CATH AND CORONARY ANGIOGRAPHY Left 12/04/2018   Procedure: LEFT HEART CATH AND CORONARY ANGIOGRAPHY;  Surgeon: Wellington Hampshire, MD;  Location: [place] [facility];  Service: Cardiovascular;  Laterality: Left;  . LEFT HEART CATH AND CORONARY ANGIOGRAPHY N/A 01/12/2019   Procedure: LEFT HEART CATH AND CORONARY ANGIOGRAPHY;  Surgeon: [name], MD;  Location:  [place] [facility];  Service: Cardiovascular;  Laterality: N/A;  . MOUTH SURGERY      Prior to Admission medications   Medication Sig Start Date End Date Taking? Authorizing Provider  ACCU-CHEK AVIVA PLUS test strip USE 1 STRIP TO CHECK GLUCOSE THREE TIMES DAILY 07/19/19   [provider]  amitriptyline (ELAVIL) 25 MG tablet Take 25 mg by mouth at bedtime. 04/18/19   [provider]  aspirin EC 81 MG tablet Take 81 mg by mouth daily.    [provider]  Cholecalciferol 2000 units TABS Take by mouth daily.     [provider]  clopidogrel (PLAVIX) 75 MG tablet Take 1 tablet (75 mg total) by mouth daily. 07/25/19   Loletha Grayer, MD  dapagliflozin propanediol (FARXIGA) 10 MG TABS tablet Take 10 mg by mouth daily.    [provider]  DULoxetine (CYMBALTA) 30 MG capsule Take 60 mg by mouth daily.  07/01/18   [provider]  etonogestrel (NEXPLANON) 68 MG IMPL implant 1 each by Subdermal route once.    [provider]  furosemide (LASIX) 40 MG tablet Take 1 tablet (40 mg total) by mouth every other day. 01/19/19 08/20/19  End, Harrell Gave, MD  glimepiride (AMARYL) 4 MG tablet Take 4 mg by mouth daily with breakfast.    [provider]  insulin aspart (NOVOLOG) 100 UNIT/ML injection Inject 10 Units into the skin 3 (three) times daily with meals. 05/04/18   Loletha Grayer, MD  Insulin Glargine (BASAGLAR KWIKPEN) 100 UNIT/ML SOPN Inject 0.3 mLs (30 Units total) into the skin daily. 07/26/19   Loletha Grayer, MD  irbesartan (AVAPRO) 75 MG tablet Take 75 mg by mouth daily.    [provider]  isosorbide mononitrate (IMDUR) 30 MG 24 hr tablet Take 2 tablets (60 mg total) by mouth daily. 12/17/18   Dunn, Areta Haber, PA-C  lactulose (CHRONULAC) 10 GM/15ML solution Take 45 mLs (30 g total) by mouth daily as needed for severe constipation. 07/28/19   Loletha Grayer, MD  meclizine (ANTIVERT) 25 MG tablet Take 25 mg by mouth 3 (three)  times daily as needed for dizziness.    [provider]  metoCLOPramide (REGLAN) 10 MG tablet Take 1 tablet (10 mg total) by mouth 4 (four) times daily -  before meals and at bedtime. 07/25/19 08/24/19  Loletha Grayer, MD  metoprolol tartrate (LOPRESSOR) 100 MG tablet Take 1 tablet (100 mg total) by mouth 2 (two) times daily. 04/29/19   Wellington Hampshire, MD  mirtazapine (REMERON) 30 MG tablet Take 2 tablets (60 mg total) by mouth at bedtime. 07/25/19   Loletha Grayer, MD  nitroGLYCERIN (NITROSTAT) 0.4 MG SL tablet Take 1 tab under your tongue while sitting for chest pain, If no relief may repeat, one tab every 5 min up to 3 tabs total over 15 mins. Patient not taking: Reported on 08/20/2019 06/09/18   Theora Gianotti, NP  pantoprazole (PROTONIX) 40 MG tablet Take 1 tablet (40 mg total) by mouth 2 (two) times daily. 06/22/19 07/22/19  Saundra Shelling, MD  prochlorperazine (COMPAZINE) 5 MG tablet TAKE 1 TABLET BY MOUTH EVERY 6 HOURS AS NEEDED FOR NAUSEA AND VOMITING 08/04/19   [provider]  promethazine (PHENERGAN) 12.5 MG tablet Take 1 tablet (12.5 mg total) by mouth every 6 (six) hours as needed for nausea or vomiting. 07/30/19   Park Liter P, DO  promethazine (PHENERGAN) 25 MG suppository Place 1 suppository (25 mg total) rectally every 6 (six) hours as needed for nausea or vomiting. 07/30/19   Wynetta Emery, Megan P, DO  senna-docusate (SENOKOT-S) 8.6-50 MG tablet Take 1 tablet by mouth 2 (two) times daily. 07/29/19   Loletha Grayer, MD    Allergies Lisinopril, Rosemary oil, Shellfish allergy, Other, and Metformin and related  Family History  Problem Relation Age of Onset  . Diabetes Father   . Cancer Maternal Grandmother        lung  . Cancer Maternal Grandfather        prostate  . Diabetes Paternal Grandfather   . Diabetes Paternal Grandmother   . Cancer - Cervical Maternal Aunt     Social History Social History   Tobacco Use  . Smoking status: Former Smoker      Years: 15.00    Quit date: 03/10/2015    Years since quitting: 4.7  . Smokeless tobacco: Never Used  Substance Use Topics  . Alcohol use: No    Alcohol/week: 0.0 standard drinks  . Drug use: Yes    Frequency: 2.0 times per week    Types: Marijuana  Review of Systems  Constitutional: No fever/chills Eyes: No visual changes. ENT: No sore throat. Cardiovascular: Denies chest pain. Respiratory: Denies shortness of breath. Gastrointestinal: Positive for abdominal pain.  Positive for nausea and vomiting.  Positive for diarrhea.  No constipation. Genitourinary: Negative for dysuria. Musculoskeletal: Negative for back pain. Skin: Negative for rash. Neurological: Negative for headaches, focal weakness or numbness.  ____________________________________________   PHYSICAL EXAM:  VITAL SIGNS: ED Triage Vitals  Enc Vitals Group     BP 11/20/19 0700 (!) 221/115     Pulse Rate 11/20/19 0658 (!) 120     Resp 11/20/19 0658 (!) 21     Temp 11/20/19 0658 97.9 F (36.6 C)     Temp Source 11/20/19 0658 Oral     SpO2 11/20/19 0653 100 %     Weight 11/20/19 0700 244 lb (110.7 kg)     Height 11/20/19 0700 5\' 5"  (1.651 m)     Head Circumference --      Peak Flow --      Pain Score 11/20/19 0700 10     Pain Loc --      Pain Edu? --      Excl. in [place]enic? --     Constitutional: Alert and oriented. Eyes: Conjunctivae are normal. Head: Atraumatic. Nose: No congestion/rhinnorhea. Mouth/Throat: Mucous membranes are moist. Neck: Normal ROM Cardiovascular: Tachycardic, regular rhythm. Grossly normal heart sounds. Respiratory: Normal respiratory effort.  No retractions. Lungs CTAB. Gastrointestinal: Soft and tender to palpation in the epigastrium with no rebound or guarding. No distention. Genitourinary: deferred Musculoskeletal: No lower extremity tenderness nor edema. Neurologic:  Normal speech and language. No gross focal neurologic deficits are appreciated. Skin:  Skin is warm, dry and  intact. No rash noted. Psychiatric: Mood and affect are normal. Speech and behavior are normal.  ____________________________________________   LABS (all labs ordered are listed, but only abnormal results are displayed)  Labs Reviewed  COMPREHENSIVE METABOLIC PANEL - Abnormal; Notable for the following components:      Result Value   CO2 18 (*)    Glucose, Bld 287 (*)    Creatinine, Ser 1.47 (*)    Albumin 3.4 (*)    GFR calc non Af Amer 45 (*)    GFR calc Af Amer 52 (*)    All other components within normal limits  CBC WITH DIFFERENTIAL/PLATELET - Abnormal; Notable for the following components:   WBC 11.6 (*)    Platelets 452 (*)    Neutro Abs 10.3 (*)    All other components within normal limits  URINALYSIS, COMPLETE (UACMP) WITH MICROSCOPIC - Abnormal; Notable for the following components:   Color, Urine YELLOW (*)    APPearance HAZY (*)    Glucose, UA >=500 (*)    Hgb urine dipstick SMALL (*)    Ketones, ur 5 (*)    Protein, ur >=300 (*)    Leukocytes,Ua SMALL (*)    WBC, UA >50 (*)    Bacteria, UA RARE (*)    All other components within normal limits  LIPASE, BLOOD  POC URINE PREG, ED  POCT PREGNANCY, URINE   ____________________________________________  EKG  ED ECG REPORT I, Blake Divine, the attending physician, personally viewed and interpreted this ECG.   Date: 11/20/2019  EKG Time: 6:57  Rate: 114  Rhythm: sinus tachycardia  Axis: RAD  Intervals:none  ST&T Change: None   PROCEDURES  Procedure(s) performed (including Critical Care):  Procedures   ____________________________________________   INITIAL IMPRESSION / ASSESSMENT AND PLAN /  ED COURSE       38 year old female with past medical history of CAD, CKD, diabetes, hypertension, and recurrent episodes of abdominal pain with nausea and vomiting presents to the ED with epigastric pain since yesterday associated with nausea and vomiting.  Current episode today seems similar to her prior  presentations and patient describes it as such, will hold off on any advanced imaging for now as she has had multiple negative CTs in the past.  Doubt cardiac etiology as EKG is unchanged and this appears more consistent with cannabis hyperemesis versus gastroparesis.  We will check labs including LFTs and lipase, treat symptomatically and reevaluate.  Lab work reassuring, kidney function is stable compared to prior.  Patient feeling much better after dose of droperidol as well as follow-up dose of morphine.  She is tolerating p.o. without difficulty here and was able to take her usual metoprolol.  She continued to have significantly elevated blood pressure and this will likely require a few days of her taking her medication to readjust.  Given significant elevation, she was given a dose of labetalol here with improvement.  Her heart rate is also much improved following IV fluid bolus.  Counseled patient to follow-up with her PCP and to return to the ED for new or worsening symptoms, patient was admitted.      ____________________________________________   FINAL CLINICAL IMPRESSION(S) / ED DIAGNOSES  Final diagnoses:  Non-intractable vomiting with nausea, unspecified vomiting type  Gastroparesis  Essential hypertension     ED Discharge Orders    None       Note:  This document was prepared using Dragon voice recognition software and may include unintentional dictation errors.   Blake Divine, MD 11/20/19 1300

## 2019-11-20 NOTE — ED Notes (Signed)
Report to deja, rn.  

## 2019-11-20 NOTE — ED Notes (Signed)
IV team at bedside 

## 2019-11-22 ENCOUNTER — Telehealth: Payer: Self-pay | Admitting: Nurse Practitioner

## 2019-11-22 NOTE — Telephone Encounter (Signed)
-----   Message from Venita Lick, NP sent at 11/21/2019  2:15 PM EST ----- Needs ER follow-up and chronic disease visit, missed recent follow-up appointment.

## 2019-11-22 NOTE — Telephone Encounter (Signed)
LVM x2 to make hops f/u

## 2019-11-22 NOTE — Telephone Encounter (Signed)
Noted  

## 2019-11-25 ENCOUNTER — Encounter: Payer: Self-pay | Admitting: Family Medicine

## 2019-11-25 ENCOUNTER — Other Ambulatory Visit: Payer: Self-pay

## 2019-11-25 ENCOUNTER — Ambulatory Visit (INDEPENDENT_AMBULATORY_CARE_PROVIDER_SITE_OTHER): Payer: Medicare Other | Admitting: Family Medicine

## 2019-11-25 ENCOUNTER — Ambulatory Visit: Payer: Medicare Other | Admitting: Nurse Practitioner

## 2019-11-25 VITALS — BP 134/84 | HR 103 | Temp 98.6°F | Ht 65.0 in | Wt 238.0 lb

## 2019-11-25 DIAGNOSIS — I5032 Chronic diastolic (congestive) heart failure: Secondary | ICD-10-CM

## 2019-11-25 DIAGNOSIS — I200 Unstable angina: Secondary | ICD-10-CM

## 2019-11-25 DIAGNOSIS — I130 Hypertensive heart and chronic kidney disease with heart failure and stage 1 through stage 4 chronic kidney disease, or unspecified chronic kidney disease: Secondary | ICD-10-CM

## 2019-11-25 DIAGNOSIS — R42 Dizziness and giddiness: Secondary | ICD-10-CM

## 2019-11-25 DIAGNOSIS — N390 Urinary tract infection, site not specified: Secondary | ICD-10-CM

## 2019-11-25 DIAGNOSIS — R112 Nausea with vomiting, unspecified: Secondary | ICD-10-CM | POA: Diagnosis not present

## 2019-11-25 DIAGNOSIS — E1165 Type 2 diabetes mellitus with hyperglycemia: Secondary | ICD-10-CM

## 2019-11-25 DIAGNOSIS — N183 Chronic kidney disease, stage 3 unspecified: Secondary | ICD-10-CM

## 2019-11-25 NOTE — Progress Notes (Signed)
BP 134/84   Pulse (!) 103   Temp 98.6 F (37 C) (Oral)   Ht 5\' 5"  (1.651 m)   Wt 238 lb (108 kg)   BMI 39.61 kg/m    Subjective:    Patient ID: [name] [name], female    DOB: [birth year], 38 y.o.   MRN: [id number]  HPI: [name] [name] is a 38 y.o. female  Chief Complaint  Patient presents with  . Hospitalization Follow-up  . Dizziness    pt states she fell this morning at home due to dizzines   Patient here today for ER f/u for significant N/V complicated by her chronic medical conditions, including insulin dependent DM and CHF. Was found to have a UTI while in the ER and feeling better since starting keflex for this. Now having several days of dizziness with standing and moving around. [place] this morning with soft landing coming out of the bathroom. Denies any injury from this. HR is elevated when she checks. BSs have been in the 200s despite not eating. Taking her insulin but not any of her PO medications due to her N/V. Denies syncope, CP, SOB, significant edema, fevers. Has been able to tolerate fluids this morning.   Relevant past medical, surgical, family and social history reviewed and updated as indicated. Interim medical history since our last visit reviewed. Allergies and medications reviewed and updated.  Review of Systems  Per HPI unless specifically indicated above     Objective:    BP 134/84   Pulse (!) 103   Temp 98.6 F (37 C) (Oral)   Ht 5\' 5"  (1.651 m)   Wt 238 lb (108 kg)   BMI 39.61 kg/m   Wt Readings from Last 3 Encounters:  11/25/19 238 lb (108 kg)  11/20/19 244 lb (110.7 kg)  08/18/19 253 lb (114.8 kg)    Physical Exam Vitals and nursing note reviewed.  Constitutional:      Appearance: Normal appearance. She is not ill-appearing.  HENT:     Head: Atraumatic.  Eyes:     Extraocular Movements: Extraocular movements intact.     Conjunctiva/sclera: Conjunctivae normal.  Cardiovascular:     Rate and Rhythm: Regular rhythm.     Heart sounds:  Normal heart sounds.     Comments: Mildly tachycardic rate Pulmonary:     Effort: Pulmonary effort is normal.     Breath sounds: Normal breath sounds. No rales.  Abdominal:     General: Bowel sounds are normal. There is no distension.     Palpations: Abdomen is soft.     Tenderness: There is no abdominal tenderness. There is no right CVA tenderness, left CVA tenderness or guarding.  Musculoskeletal:        General: Normal range of motion.     Cervical back: Normal range of motion and neck supple.  Skin:    General: Skin is warm and dry.  Neurological:     Mental Status: She is alert and oriented to person, place, and time.  Psychiatric:        Mood and Affect: Mood normal.        Thought Content: Thought content normal.        Judgment: Judgment normal.     Results for orders placed or performed in visit on 11/25/19  Microscopic Examination   URINE  Result Value Ref Range   WBC, UA >30 (A) 0 - 5 /hpf   RBC 11-30 (A) 0 - 2 /hpf   Epithelial Cells (non  renal) 0-10 0 - 10 /hpf   Casts Present None seen /lpf   Cast Type Granular casts (A) N/A   Mucus, UA Present Not Estab.   Bacteria, UA Few (A) None seen/Few  Urine Culture, Reflex   URINE  Result Value Ref Range   Urine Culture, Routine Final report    Organism ID, Bacteria Comment   CBC with Differential/Platelet out  Result Value Ref Range   WBC 7.9 3.4 - 10.8 x10E3/uL   RBC 4.91 3.77 - 5.28 x10E6/uL   Hemoglobin 12.8 11.1 - 15.9 g/dL   Hematocrit 40.6 34.0 - 46.6 %   MCV 83 79 - 97 fL   MCH 26.1 (L) 26.6 - 33.0 pg   MCHC 31.5 31.5 - 35.7 g/dL   RDW 13.3 11.7 - 15.4 %   Platelets 446 150 - 450 x10E3/uL   Neutrophils 56 Not Estab. %   Lymphs 35 Not Estab. %   Monocytes 6 Not Estab. %   Eos 2 Not Estab. %   Basos 1 Not Estab. %   Neutrophils Absolute 4.4 1.4 - 7.0 x10E3/uL   Lymphocytes Absolute 2.7 0.7 - 3.1 x10E3/uL   Monocytes Absolute 0.5 0.1 - 0.9 x10E3/uL   EOS (ABSOLUTE) 0.2 0.0 - 0.4 x10E3/uL    Basophils Absolute 0.1 0.0 - 0.2 x10E3/uL   Immature Granulocytes 0 Not Estab. %   Immature Grans (Abs) 0.0 0.0 - 0.1 C14G8/JE  Basic metabolic panel  Result Value Ref Range   Glucose 198 (H) 65 - 99 mg/dL   BUN 29 (H) 6 - 20 mg/dL   Creatinine, Ser 3.51 (H) 0.57 - 1.00 mg/dL   GFR calc non Af Amer 16 (L) >59 mL/min/1.73   GFR calc Af Amer 18 (L) >59 mL/min/1.73   BUN/Creatinine Ratio 8 (L) 9 - 23   Sodium 133 (L) 134 - 144 mmol/L   Potassium 4.0 3.5 - 5.2 mmol/L   Chloride 97 96 - 106 mmol/L   CO2 23 20 - 29 mmol/L   Calcium 8.7 8.7 - 10.2 mg/dL  UA/M w/rflx Culture, Routine   Specimen: Urine   URINE  Result Value Ref Range   Specific Gravity, UA >1.030 (H) 1.005 - 1.030   pH, UA 5.0 5.0 - 7.5   Color, UA Yellow Yellow   Appearance Ur Hazy (A) Clear   Leukocytes,UA 1+ (A) Negative   Protein,UA 3+ (A) Negative/Trace   Glucose, UA Negative Negative   Ketones, UA Trace (A) Negative   RBC, UA 3+ (A) Negative   Bilirubin, UA Negative Negative   Urobilinogen, Ur 0.2 0.2 - 1.0 mg/dL   Nitrite, UA Negative Negative   Microscopic Examination See below:    Urinalysis Reflex Comment       Assessment & Plan:   Problem List Items Addressed This Visit      Cardiovascular and Mediastinum   Chronic diastolic heart failure (HCC) (Chronic)    Appears euvolemic today, but monitor closely while re-hydrating now that tolerating PO. 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 BP change significant from sitting to standing, nearly 30 points systolic on orthostatic vital signs. Rehydrate and f/u if sxs not improving        Endocrine   Poorly  controlled type 2 diabetes mellitus ([place])    Cautiously monitor BSs while advancing diet, restart PO medications once  tolerating PO. Unclear if N/V is from her gastroparesis or her infection at this time. Continue to monitor closely for improvement       Other Visit Diagnoses    Dizziness    -  Primary   Suspect orthostatic hypotension from dehydration. Rehydrate now that tolerating PO, recheck labs. F/u if sxs worsening   Relevant Orders   CBC with Differential/Platelet out (Completed)   Basic metabolic panel (Completed)   Acute lower UTI       Recheck U/A, continue keflex and rehydration   Relevant Orders   CBC with Differential/Platelet out (Completed)   UA/M w/rflx Culture, Routine (Completed)   Non-intractable vomiting with nausea, unspecified vomiting type       Phenergan, bland diet, check labs. Push fluids.        Follow up plan: Return for as scheduled.

## 2019-11-26 ENCOUNTER — Telehealth: Payer: Self-pay | Admitting: Family Medicine

## 2019-11-26 ENCOUNTER — Other Ambulatory Visit
Admission: RE | Admit: 2019-11-26 | Discharge: 2019-11-26 | Disposition: A | Discharge status: Home / Self Care | Payer: Medicare Other | Source: Ambulatory Visit | Attending: Family Medicine | Admitting: Family Medicine

## 2019-11-26 DIAGNOSIS — N179 Acute kidney failure, unspecified: Secondary | ICD-10-CM | POA: Diagnosis present

## 2019-11-26 LAB — CBC WITH DIFFERENTIAL/PLATELET
Basophils Absolute: 0.1 10*3/uL (ref 0.0–0.2)
Basos: 1 %
EOS (ABSOLUTE): 0.2 10*3/uL (ref 0.0–0.4)
Eos: 2 %
Hematocrit: 40.6 % (ref 34.0–46.6)
Hemoglobin: 12.8 g/dL (ref 11.1–15.9)
Immature Grans (Abs): 0 10*3/uL (ref 0.0–0.1)
Immature Granulocytes: 0 %
Lymphocytes Absolute: 2.7 10*3/uL (ref 0.7–3.1)
Lymphs: 35 %
MCH: 26.1 pg — ABNORMAL LOW (ref 26.6–33.0)
MCHC: 31.5 g/dL (ref 31.5–35.7)
MCV: 83 fL (ref 79–97)
Monocytes Absolute: 0.5 10*3/uL (ref 0.1–0.9)
Monocytes: 6 %
Neutrophils Absolute: 4.4 10*3/uL (ref 1.4–7.0)
Neutrophils: 56 %
Platelets: 446 10*3/uL (ref 150–450)
RBC: 4.91 x10E6/uL (ref 3.77–5.28)
RDW: 13.3 % (ref 11.7–15.4)
WBC: 7.9 10*3/uL (ref 3.4–10.8)

## 2019-11-26 LAB — BASIC METABOLIC PANEL
Anion gap: 9 (ref 5–15)
BUN/Creatinine Ratio: 8 — ABNORMAL LOW (ref 9–23)
BUN: 29 mg/dL — ABNORMAL HIGH (ref 6–20)
BUN: 25 mg/dL — ABNORMAL HIGH (ref 6–20)
CO2: 23 mmol/L (ref 20–29)
CO2: 23 mmol/L (ref 22–32)
Calcium: 8.7 mg/dL (ref 8.7–10.2)
Calcium: 8.3 mg/dL — ABNORMAL LOW (ref 8.9–10.3)
Chloride: 97 mmol/L (ref 96–106)
Chloride: 103 mmol/L (ref 98–111)
Creatinine, Ser: 3.51 mg/dL — ABNORMAL HIGH (ref 0.57–1.00)
Creatinine, Ser: 2.4 mg/dL — ABNORMAL HIGH (ref 0.44–1.00)
GFR calc Af Amer: 18 mL/min/{1.73_m2} — ABNORMAL LOW (ref >59)
GFR calc Af Amer: 29 mL/min — ABNORMAL LOW (ref >60)
GFR calc non Af Amer: 16 mL/min/{1.73_m2} — ABNORMAL LOW (ref >59)
GFR calc non Af Amer: 25 mL/min — ABNORMAL LOW (ref >60)
Glucose, Bld: 154 mg/dL — ABNORMAL HIGH (ref 70–99)
Glucose: 198 mg/dL — ABNORMAL HIGH (ref 65–99)
Potassium: 4 mmol/L (ref 3.5–5.2)
Potassium: 3.6 mmol/L (ref 3.5–5.1)
Sodium: 133 mmol/L — ABNORMAL LOW (ref 134–144)
Sodium: 135 mmol/L (ref 135–145)

## 2019-11-26 NOTE — Telephone Encounter (Signed)
Called pt, left VM to return call as soon as she can regarding her lab results.

## 2019-11-26 NOTE — Telephone Encounter (Signed)
Pt returned my call, discussed significantly worsened renal function and evidence of dehydration. Suggested she go to the ER for IV rehydration and close monitoring/stat labs. Pt wishes to avoid this at this time so will place a STAT bmp for repeat and pt to go straight to [facility] to get lab drawn [facility] and will wait there for [place] to call with results. If not improved since yesterday is agreeable to going over to the ER for further management. She does report feeling better than yesterday and not dizzy, just weak. Keeping some fluids down since yesterday.

## 2019-11-27 LAB — MICROSCOPIC EXAMINATION: WBC, UA: >30 /hpf — AB (ref 0–5)

## 2019-11-27 LAB — UA/M W/RFLX CULTURE, ROUTINE
Bilirubin, UA: NEGATIVE
Glucose, UA: NEGATIVE
Nitrite, UA: NEGATIVE
Specific Gravity, UA: >1.03 — ABNORMAL HIGH (ref 1.005–1.030)
Urobilinogen, Ur: 0.2 mg/dL (ref 0.2–1.0)
pH, UA: 5 (ref 5.0–7.5)

## 2019-11-27 LAB — URINE CULTURE, REFLEX

## 2019-11-29 ENCOUNTER — Other Ambulatory Visit: Payer: Self-pay | Admitting: Family Medicine

## 2019-11-29 DIAGNOSIS — N179 Acute kidney failure, unspecified: Secondary | ICD-10-CM

## 2019-11-29 NOTE — Assessment & Plan Note (Signed)
Recheck BMP, suspect her sxs from significant dehydration from poor PO intake due to N/V as well as her elevated BSs from infection. BP change significant from sitting to standing, nearly 30 points systolic on orthostatic vital signs. Rehydrate and f/u if sxs not improving

## 2019-11-29 NOTE — Assessment & Plan Note (Signed)
Cautiously monitor BSs while advancing diet, restart PO medications once tolerating PO. Unclear if N/V is from her gastroparesis or her infection at this time. Continue to monitor closely for improvement

## 2019-11-29 NOTE — Assessment & Plan Note (Signed)
Appears euvolemic today, but monitor closely while re-hydrating now that tolerating PO. Restart PO medications now that tolerating PO intake again

## 2019-12-08 ENCOUNTER — Encounter: Payer: Self-pay | Admitting: Family Medicine

## 2019-12-09 ENCOUNTER — Other Ambulatory Visit: Payer: Self-pay | Admitting: Family Medicine

## 2019-12-13 ENCOUNTER — Other Ambulatory Visit: Payer: Self-pay

## 2019-12-13 ENCOUNTER — Other Ambulatory Visit: Payer: Medicare Other

## 2019-12-13 ENCOUNTER — Other Ambulatory Visit: Payer: Self-pay | Admitting: Family Medicine

## 2019-12-13 DIAGNOSIS — N179 Acute kidney failure, unspecified: Secondary | ICD-10-CM

## 2019-12-13 NOTE — Telephone Encounter (Signed)
Requested medication (s) are due for refill today: yes  Requested medication (s) are on the active medication list: yes  Last refill: 07/30/2019  #30 0 refills  Future visit scheduled no  Notes to clinic  Not delegated  Requested Prescriptions  Pending Prescriptions Disp Refills   promethazine (PHENERGAN) 12.5 MG tablet [Pharmacy Med Name: Promethazine HCl 12.5 MG Oral Tablet] 30 tablet 0    Sig: TAKE 1 TABLET BY MOUTH EVERY 6 HOURS AS NEEDED FOR NAUSEA AND VOMITING      Not Delegated - Gastroenterology: Antiemetics Failed - 12/13/2019  3:12 PM      Failed - This refill cannot be delegated      Passed - Valid encounter within last 6 months    Recent Outpatient Visits           2 weeks ago Dizziness   [facility] [place] American, PA-C   3 months ago Diabetic gastroparesis associated with type 2 diabetes mellitus ([place])   [place], Jolene T, NP   4 months ago Cannabis hyperemesis syndrome concurrent with and due to cannabis abuse ([place])   [place], Megan P, DO   7 months ago Diabetic gastroparesis associated with type 2 diabetes mellitus ([place])   [place] Cannady, Jolene T, NP   11 months ago Thrombocytosis [facility])   [place], Barbaraann Faster, NP

## 2019-12-14 ENCOUNTER — Telehealth: Payer: Self-pay | Admitting: Family Medicine

## 2019-12-14 LAB — BASIC METABOLIC PANEL
BUN/Creatinine Ratio: 7 — ABNORMAL LOW (ref 9–23)
BUN: 27 mg/dL — ABNORMAL HIGH (ref 6–20)
CO2: 26 mmol/L (ref 20–29)
Calcium: 8.7 mg/dL (ref 8.7–10.2)
Chloride: 94 mmol/L — ABNORMAL LOW (ref 96–106)
Creatinine, Ser: 3.99 mg/dL — ABNORMAL HIGH (ref 0.57–1.00)
GFR calc Af Amer: 15 mL/min/{1.73_m2} — ABNORMAL LOW (ref >59)
GFR calc non Af Amer: 13 mL/min/{1.73_m2} — ABNORMAL LOW (ref >59)
Glucose: 183 mg/dL — ABNORMAL HIGH (ref 65–99)
Potassium: 4.1 mmol/L (ref 3.5–5.2)
Sodium: 135 mmol/L (ref 134–144)

## 2019-12-14 NOTE — Telephone Encounter (Signed)
Called pt, discussed significantly worsened renal function. She states she's had another "spell" the last 4 days of N/V/D and inability to tolerate PO well. Feels weak, dizzy and tired. Recommended she go to ER for fluids and monitoring. Patient states understanding and will consider. Does have Nephrology appt scheduled for tomorrow either way.

## 2019-12-15 DIAGNOSIS — I10 Essential (primary) hypertension: Secondary | ICD-10-CM | POA: Diagnosis not present

## 2019-12-15 DIAGNOSIS — R801 Persistent proteinuria, unspecified: Secondary | ICD-10-CM | POA: Diagnosis not present

## 2019-12-15 DIAGNOSIS — N1831 Chronic kidney disease, stage 3a: Secondary | ICD-10-CM | POA: Diagnosis not present

## 2019-12-15 DIAGNOSIS — E1129 Type 2 diabetes mellitus with other diabetic kidney complication: Secondary | ICD-10-CM | POA: Diagnosis not present

## 2019-12-15 DIAGNOSIS — N179 Acute kidney failure, unspecified: Secondary | ICD-10-CM | POA: Diagnosis not present

## 2019-12-15 LAB — HEMOGLOBIN A1C: Hemoglobin A1C: 9

## 2019-12-22 DIAGNOSIS — R809 Proteinuria, unspecified: Secondary | ICD-10-CM | POA: Diagnosis not present

## 2019-12-22 DIAGNOSIS — N1831 Chronic kidney disease, stage 3a: Secondary | ICD-10-CM | POA: Diagnosis not present

## 2019-12-22 DIAGNOSIS — R801 Persistent proteinuria, unspecified: Secondary | ICD-10-CM | POA: Diagnosis not present

## 2019-12-22 DIAGNOSIS — Z794 Long term (current) use of insulin: Secondary | ICD-10-CM | POA: Diagnosis not present

## 2019-12-22 DIAGNOSIS — E1143 Type 2 diabetes mellitus with diabetic autonomic (poly)neuropathy: Secondary | ICD-10-CM | POA: Diagnosis not present

## 2019-12-22 DIAGNOSIS — E1129 Type 2 diabetes mellitus with other diabetic kidney complication: Secondary | ICD-10-CM | POA: Diagnosis not present

## 2019-12-22 DIAGNOSIS — I10 Essential (primary) hypertension: Secondary | ICD-10-CM | POA: Diagnosis not present

## 2019-12-22 DIAGNOSIS — N179 Acute kidney failure, unspecified: Secondary | ICD-10-CM | POA: Diagnosis not present

## 2019-12-22 DIAGNOSIS — E782 Mixed hyperlipidemia: Secondary | ICD-10-CM | POA: Diagnosis not present

## 2019-12-22 DIAGNOSIS — E1169 Type 2 diabetes mellitus with other specified complication: Secondary | ICD-10-CM | POA: Diagnosis not present

## 2019-12-22 DIAGNOSIS — N1832 Chronic kidney disease, stage 3b: Secondary | ICD-10-CM | POA: Diagnosis not present

## 2019-12-22 DIAGNOSIS — E669 Obesity, unspecified: Secondary | ICD-10-CM | POA: Diagnosis not present

## 2019-12-22 DIAGNOSIS — E1121 Type 2 diabetes mellitus with diabetic nephropathy: Secondary | ICD-10-CM | POA: Diagnosis not present

## 2019-12-22 DIAGNOSIS — E1165 Type 2 diabetes mellitus with hyperglycemia: Secondary | ICD-10-CM | POA: Diagnosis not present

## 2019-12-22 DIAGNOSIS — E1159 Type 2 diabetes mellitus with other circulatory complications: Secondary | ICD-10-CM | POA: Diagnosis not present

## 2020-01-11 ENCOUNTER — Other Ambulatory Visit: Payer: Self-pay | Admitting: Nurse Practitioner

## 2020-01-11 ENCOUNTER — Telehealth: Payer: Self-pay

## 2020-01-11 ENCOUNTER — Encounter: Payer: Self-pay | Admitting: Nurse Practitioner

## 2020-01-11 DIAGNOSIS — E1165 Type 2 diabetes mellitus with hyperglycemia: Secondary | ICD-10-CM

## 2020-01-11 NOTE — Telephone Encounter (Signed)
Yes, sheala from [facility] states they need referral faxed to their office asap so she can be seen now.  Fax  [phone]  cb [phone]

## 2020-01-11 NOTE — Telephone Encounter (Signed)
Called and left patient a VM letting her know that referral was placed and sent

## 2020-01-11 NOTE — Telephone Encounter (Signed)
I placed referral.

## 2020-01-11 NOTE — Telephone Encounter (Signed)
Copied from [place] [phone]. Topic: Referral - Request for Referral >> Jan 11, 2020 11:01 AM Greggory Keen D wrote: Pt called saying she is at the [facility] for an appt for an exam on her eye for blurred vision in her left eye.  Her appt was at 11:00  she has medicaid and they are wanting to file it as Medical and need an approval to do so by her primary CB#  office number [phone] Pt's # [phone]   Routing to provider for referral.

## 2020-01-11 NOTE — Telephone Encounter (Signed)
I approve this, do I need to place referral for this to have full approval?

## 2020-01-13 DIAGNOSIS — H524 Presbyopia: Secondary | ICD-10-CM | POA: Diagnosis not present

## 2020-01-13 DIAGNOSIS — H25012 Cortical age-related cataract, left eye: Secondary | ICD-10-CM | POA: Diagnosis not present

## 2020-01-13 DIAGNOSIS — E083293 Diabetes mellitus due to underlying condition with mild nonproliferative diabetic retinopathy without macular edema, bilateral: Secondary | ICD-10-CM | POA: Diagnosis not present

## 2020-01-13 DIAGNOSIS — E113293 Type 2 diabetes mellitus with mild nonproliferative diabetic retinopathy without macular edema, bilateral: Secondary | ICD-10-CM | POA: Diagnosis not present

## 2020-01-24 DIAGNOSIS — Z794 Long term (current) use of insulin: Secondary | ICD-10-CM | POA: Diagnosis not present

## 2020-01-24 DIAGNOSIS — E1143 Type 2 diabetes mellitus with diabetic autonomic (poly)neuropathy: Secondary | ICD-10-CM | POA: Diagnosis not present

## 2020-01-24 DIAGNOSIS — E1121 Type 2 diabetes mellitus with diabetic nephropathy: Secondary | ICD-10-CM | POA: Diagnosis not present

## 2020-01-24 DIAGNOSIS — E1165 Type 2 diabetes mellitus with hyperglycemia: Secondary | ICD-10-CM | POA: Diagnosis not present

## 2020-01-24 DIAGNOSIS — I10 Essential (primary) hypertension: Secondary | ICD-10-CM | POA: Diagnosis not present

## 2020-01-24 DIAGNOSIS — R110 Nausea: Secondary | ICD-10-CM | POA: Diagnosis not present

## 2020-01-24 DIAGNOSIS — N1832 Chronic kidney disease, stage 3b: Secondary | ICD-10-CM | POA: Diagnosis not present

## 2020-01-24 DIAGNOSIS — E1159 Type 2 diabetes mellitus with other circulatory complications: Secondary | ICD-10-CM | POA: Diagnosis not present

## 2020-01-24 DIAGNOSIS — E1169 Type 2 diabetes mellitus with other specified complication: Secondary | ICD-10-CM | POA: Diagnosis not present

## 2020-01-24 DIAGNOSIS — E669 Obesity, unspecified: Secondary | ICD-10-CM | POA: Diagnosis not present

## 2020-01-31 ENCOUNTER — Other Ambulatory Visit: Payer: Self-pay | Admitting: Nurse Practitioner

## 2020-01-31 NOTE — Telephone Encounter (Signed)
Requested medication (s) are due for refill today  No  Requested medication (s) are on the active medication list   As a one time order.   Future visit scheduled  No.  LOV  11/25/19.  Last ordered 12/13/19  30 tabs no refills. Routing to PCP for consideration.   Requested Prescriptions  Pending Prescriptions Disp Refills   promethazine (PHENERGAN) 12.5 MG tablet [Pharmacy Med Name: Promethazine HCl 12.5 MG Oral Tablet] 30 tablet 0    Sig: TAKE 1 TABLET BY MOUTH EVERY 6 HOURS AS NEEDED FOR NAUSEA AND VOMITING      Not Delegated - Gastroenterology: Antiemetics Failed - 01/31/2020 11:20 AM      Failed - This refill cannot be delegated      Passed - Valid encounter within last 6 months    Recent Outpatient Visits           2 months ago Dizziness   [facility] Vol[place] American, PA-C   5 months ago Diabetic gastroparesis associated with type 2 diabetes mellitus ([place])   [place], Jolene T, NP   6 months ago Cannabis hyperemesis syndrome concurrent with and due to cannabis abuse ([place])   [place], Megan P, DO   9 months ago Diabetic gastroparesis associated with type 2 diabetes mellitus ([place])   [place] Cannady, Henrine Screws T, NP   1 year ago Thrombocytosis [facility])   [facility] [place], Barbaraann Faster, NP

## 2020-02-04 DIAGNOSIS — Z20822 Contact with and (suspected) exposure to covid-19: Secondary | ICD-10-CM | POA: Diagnosis not present

## 2020-02-07 DIAGNOSIS — E113512 Type 2 diabetes mellitus with proliferative diabetic retinopathy with macular edema, left eye: Secondary | ICD-10-CM | POA: Diagnosis not present

## 2020-02-07 DIAGNOSIS — H269 Unspecified cataract: Secondary | ICD-10-CM | POA: Diagnosis not present

## 2020-02-07 DIAGNOSIS — E113551 Type 2 diabetes mellitus with stable proliferative diabetic retinopathy, right eye: Secondary | ICD-10-CM | POA: Diagnosis not present

## 2020-02-12 NOTE — Progress Notes (Signed)
Opened in error

## 2020-02-17 ENCOUNTER — Encounter: Payer: Self-pay | Admitting: Nurse Practitioner

## 2020-02-17 ENCOUNTER — Other Ambulatory Visit: Payer: Self-pay | Admitting: Nurse Practitioner

## 2020-02-17 ENCOUNTER — Other Ambulatory Visit: Payer: Self-pay | Admitting: Family Medicine

## 2020-02-17 MED ORDER — PANTOPRAZOLE SODIUM 40 MG PO TBEC: 40.0000 mg | DELAYED_RELEASE_TABLET | Freq: Two times a day (BID) | ORAL | 0 refills | Status: DC

## 2020-02-17 NOTE — Telephone Encounter (Signed)
Requested  medications are  due for refill today yes  Requested medications are on the active medication list yes  Last refill 8/26  Future visit scheduled no  Notes to clinic Not delegated

## 2020-02-18 NOTE — Telephone Encounter (Signed)
LOV: 11/25/2019

## 2020-02-21 DIAGNOSIS — E1121 Type 2 diabetes mellitus with diabetic nephropathy: Secondary | ICD-10-CM | POA: Diagnosis not present

## 2020-02-21 DIAGNOSIS — I10 Essential (primary) hypertension: Secondary | ICD-10-CM | POA: Diagnosis not present

## 2020-02-21 DIAGNOSIS — E669 Obesity, unspecified: Secondary | ICD-10-CM | POA: Diagnosis not present

## 2020-02-21 DIAGNOSIS — E11311 Type 2 diabetes mellitus with unspecified diabetic retinopathy with macular edema: Secondary | ICD-10-CM | POA: Diagnosis not present

## 2020-02-21 DIAGNOSIS — Z794 Long term (current) use of insulin: Secondary | ICD-10-CM | POA: Diagnosis not present

## 2020-02-21 DIAGNOSIS — E1169 Type 2 diabetes mellitus with other specified complication: Secondary | ICD-10-CM | POA: Diagnosis not present

## 2020-02-21 DIAGNOSIS — E1143 Type 2 diabetes mellitus with diabetic autonomic (poly)neuropathy: Secondary | ICD-10-CM | POA: Diagnosis not present

## 2020-02-21 DIAGNOSIS — E1159 Type 2 diabetes mellitus with other circulatory complications: Secondary | ICD-10-CM | POA: Diagnosis not present

## 2020-02-21 DIAGNOSIS — E1165 Type 2 diabetes mellitus with hyperglycemia: Secondary | ICD-10-CM | POA: Diagnosis not present

## 2020-02-21 DIAGNOSIS — E785 Hyperlipidemia, unspecified: Secondary | ICD-10-CM | POA: Diagnosis not present

## 2020-02-21 DIAGNOSIS — N1832 Chronic kidney disease, stage 3b: Secondary | ICD-10-CM | POA: Diagnosis not present

## 2020-03-08 DIAGNOSIS — E113512 Type 2 diabetes mellitus with proliferative diabetic retinopathy with macular edema, left eye: Secondary | ICD-10-CM | POA: Diagnosis not present

## 2020-03-18 ENCOUNTER — Encounter: Payer: Self-pay | Admitting: Nurse Practitioner

## 2020-03-20 ENCOUNTER — Other Ambulatory Visit: Payer: Self-pay | Admitting: Nurse Practitioner

## 2020-03-26 NOTE — Progress Notes (Signed)
Office Visit    Patient Name: [name] [name] Date of Encounter: 03/27/2020  Primary Care Provider:  Venita Lick, NP Primary Cardiologist:  Kathlyn [place], MD Electrophysiologist:  None   Chief Complaint    [name] [name] is a 39 y.o. female with a hx of Dm2, diabetic gastroparesis with chronic nausea and vomiting, CAD, ICm with subsequent normalization of LV function, stage II-III CKD, HTN, HLD, obesity presents today for follow up of HTN.   Past Medical History    Past Medical History:  Diagnosis Date  . CAD (coronary artery disease)    a. 03/2015 NSTEMI/PCI: LCX 197m (DES), OM1 100p (DES - vessel labeled Ramus in subsequent cath report). Minor irregs to LAD/RCA; b. 04/2015 Cath: LM nl, LAD 40p, RI patent stent, LCX patent stent, RCA nl, EF 55-65%; c. 02/2016 MV: basal inferolateral and mid inferolateral defect/infarct w/o ischemia. EF 55-65%; d. 11/2018 PCI: LM 30ost, LAD 30p, LCX 95p/m (PTCA->20%), OM1 10ost, OM2 40ost, RCA 85p.  Marland Kitchen Chronic abdominal pain   . Chronic diastolic (congestive) heart failure ([place])    a. 03/2015 Echo: EF 30-35%, mild concentric LVH, severe HK of inf, inflat, & lat walls, mod MR, mild TR;  b. 04/2015 LV Gram: EF 55-65%; c. 09/2017 Echo: EF 55-60%, no rwma. Mild MR; d. 11/2018 Echo: Ef 60-65%, no rwma.  . CKD (chronic kidney disease), stage III   . Diabetes type 2, controlled ([place])    a. since 2002   . Diabetic gastroparesis (H[facility])    a. chronic nausea/vomiting.  . Diverticulosis, sigmoid 07/2016  . Heavy menses    a. H/O IUD - expired in 2014 - remains in place.  Marland Kitchen Hx of migraines   . Hyperlipidemia    Intolerant of atorvastatin, rosuvastatin  . Hypertension   . Iron deficiency anemia   . Ischemic cardiomyopathy (resolved)    a. 03/2015 EF 30-35% post NSTEMI;  b. 04/2015 EF 55-65% on LV gram; c. 09/2017 Echo: EF 55-60%; d. 11/2018 Echo: Ef 60-65%.  . Obesity   . Tobacco abuse    a. quit 03/2015.  Marland Kitchen Uterine fibroid   . Vertigo   . Vitamin D  deficiency    Past Surgical History:  Procedure Laterality Date  . APPENDECTOMY    . CARDIAC CATHETERIZATION  4/16   x2 stent [facility]  . CARDIAC CATHETERIZATION N/A 05/05/2015   Procedure: Left Heart Cath and Coronary Angiography;  Surgeon: Peter M Martinique, MD;  Location: [place] CV LAB;  Service: Cardiovascular;  Laterality: N/A;  . COLONOSCOPY WITH PROPOFOL N/A 07/31/2016   Procedure: COLONOSCOPY WITH PROPOFOL;  Surgeon: Lollie Sails, MD;  [facility] ENDOSCOPY;  Service: Endoscopy;  Laterality: N/A;  . CORONARY BALLOON ANGIOPLASTY N/A 12/04/2018   Procedure: CORONARY BALLOON ANGIOPLASTY;  Surgeon: Wellington Hampshire, MD;  Location: [place] CV LAB;  Service: Cardiovascular;  Laterality: N/A;  . CORONARY STENT INTERVENTION N/A 01/12/2019   Procedure: CORONARY STENT INTERVENTION;  Surgeon: [name], MD;  Location: [place] CV LAB;  Service: Cardiovascular;  Laterality: N/A;  . ESOPHAGOGASTRODUODENOSCOPY (EGD) WITH PROPOFOL N/A 07/31/2016   Procedure: ESOPHAGOGASTRODUODENOSCOPY (EGD) WITH PROPOFOL;  Surgeon: Lollie Sails, MD;  Location: [facility] ENDOSCOPY;  Service: Endoscopy;  Laterality: N/A;  . INTRAVASCULAR PRESSURE WIRE/FFR STUDY N/A 12/04/2018   Procedure: INTRAVASCULAR PRESSURE WIRE/FFR STUDY;  Surgeon: Wellington Hampshire, MD;  Location: [place] CV LAB;  Service: Cardiovascular;  Laterality: N/A;  . LAPAROSCOPIC APPENDECTOMY N/A 08/07/2016   Procedure: APPENDECTOMY LAPAROSCOPIC;  Surgeon:  Hubbard Robinson, MD;  Location: [facility] ORS;  Service: General;  Laterality: N/A;  . LEFT HEART CATH AND CORONARY ANGIOGRAPHY Left 12/04/2018   Procedure: LEFT HEART CATH AND CORONARY ANGIOGRAPHY;  Surgeon: Wellington Hampshire, MD;  Location: [place] CV LAB;  Service: Cardiovascular;  Laterality: Left;  . LEFT HEART CATH AND CORONARY ANGIOGRAPHY N/A 01/12/2019   Procedure: LEFT HEART CATH AND CORONARY ANGIOGRAPHY;  Surgeon: [name], MD;  Location: [place] CV  LAB;  Service: Cardiovascular;  Laterality: N/A;  . MOUTH SURGERY      Allergies  Allergies  Allergen Reactions  . Lisinopril Anaphylaxis, Itching and Swelling  . Rosemary Oil Anaphylaxis  . Shellfish Allergy Itching and Swelling  . Other Itching and Swelling    [place] seasoning  . Metformin And Related Diarrhea, Nausea And Vomiting and Rash    History of Present Illness    [name] [name] is a 39 y.o. female with a hx of Dm2, diabetic gastroparesis with chronic nausea and vomiting, CAD, ICm with subsequent normalization of LV function, stage II-III CKD, HTN, HLD, obesity. She was last seen 04/2019 via telemedicine by Dr. Fletcher Anon.  In April 2016, she was hospitalized with NSSTEMI with LVEF 20-25%. Underwent cardiac cath with DES to LCx ad OM1. Follow-up cath May 2016 with patent stents and normalization of LVEF. Stress test March 2017 with no evidence of ischemia.   She had recurrent angina December 2019. Catheterization with patent stent in OM1 and focal in-setnt restenosis in the LCx at origin of OM2 and new significant stenosis in prox-mid RCA. Underwent balloon angioplasty of the mid left circumflex and staged RCA PCI with DES placement. Wore a cardiac monitor 01/2019 with primary SR, rare PVC, and no significant arrhythmia.   At last visit her Metoprolol was increased for tachycardia and IRbesartan decreased to prevent hypotension.   Tells me the last week or so her blood pressure has been elevated in the setting of abdominal discomfort. Has had stomach pain for a few years with no known cause, follows with her PCP. Reports BP as high as 210/125 while having severe stomach pain. After her medications it came down to the 160s. When her blood pressure is high she will get headache and can "hear her heartbeat in her ears". Also reports intermittent hypotension with SBP as low as 94 with some associated lightheadedness.    EKGs/Labs/Other Studies Reviewed:   The following studies were  reviewed today: Long term monitory 01/2019 Normal sinus rhythm with an average heart rate of 94 bpm.  Intermittent sinus tachycardia. Triggered events by the patient did not correlate with significant arrhythmia.  She was in sinus rhythm or sinus tachycardia during these events. Rare PVCs.  Coronary intervention 01/12/19 Conclusions: 1. Stable appearance of coronary arteries since 12/04/2018, with 80-90% mid RCA disease and 20-30% in-stent restenosis in mid LCx. 2. Moderately to severely elevated left ventricular filling pressure. 3. Successful PCI to mid RCA using Synergy 2.5 x 32 mm drug-eluting stent (postdilated to 2.8 mm) with 0% residual stenosis and TIMI-3 flow.   Recommendations: 1. Overnight extended recovery. 2. Continue dual antiplatelet therapy with aspirin and clopidogrel (through at least 12/05/2019 but ideally longer). 3. Aggressive secondary prevention; plans already made to initiate PCSK9 inhibitor as an outpatient. 4. Defer post catheterization hydration in the setting of moderately to severely elevated LVEDP.  If renal function tolerates, consider initiation of gentle diuresis in the morning.  EKG:  EKG is ordered today.  The ekg ordered today  demonstrates SR 86 bpm with no acute ST/T wave changes.  Recent Labs: 04/16/2019: B Natriuretic Peptide 50.0 07/22/2019: TSH 1.005 11/20/2019: ALT 15 11/25/2019: Hemoglobin 12.8; Platelets 446 12/13/2019: BUN 27; Creatinine, Ser 3.99; Potassium 4.1; Sodium 135  Recent Lipid Panel    Component Value Date/Time   CHOL 217 (H) 08/18/2019 1354   CHOL 106 08/30/2015 1439   CHOL 133 03/31/2015 0225   TRIG 177 (H) 08/18/2019 1354   TRIG 46 08/30/2015 1439   TRIG 77 03/31/2015 0225   HDL 47 08/18/2019 1354   HDL 26 (L) 03/31/2015 0225   CHOLHDL 4.6 (H) 12/17/2018 1145   VLDL 9 08/30/2015 1439   VLDL 15 03/31/2015 0225   LDLCALC 138 (H) 08/18/2019 1354   LDLCALC 92 03/31/2015 0225   LDLDIRECT 163 (H) 12/17/2018 1145    Home  Medications   Current Meds  Medication Sig  . ACCU-CHEK AVIVA PLUS test strip USE 1 STRIP TO CHECK GLUCOSE THREE TIMES DAILY  . amitriptyline (ELAVIL) 25 MG tablet Take 25 mg by mouth at bedtime.  Marland Kitchen aspirin EC 81 MG tablet Take 81 mg by mouth daily.  . Cholecalciferol 2000 units TABS Take by mouth daily.   . clopidogrel (PLAVIX) 75 MG tablet Take 1 tablet (75 mg total) by mouth daily.  . dapagliflozin propanediol (FARXIGA) 10 MG TABS tablet Take 10 mg by mouth daily.  . DULoxetine (CYMBALTA) 30 MG capsule Take 60 mg by mouth daily.   Marland Kitchen etonogestrel (NEXPLANON) 68 MG IMPL implant 1 each by Subdermal route once.  . furosemide (LASIX) 40 MG tablet Take 1 tablet (40 mg total) by mouth every other day.  Marland Kitchen glimepiride (AMARYL) 4 MG tablet Take 4 mg by mouth daily with breakfast.  . insulin aspart (NOVOLOG) 100 UNIT/ML injection Inject 10 Units into the skin 3 (three) times daily with meals.  . Insulin Glargine (BASAGLAR KWIKPEN) 100 UNIT/ML SOPN Inject 0.3 mLs (30 Units total) into the skin daily.  . irbesartan (AVAPRO) 75 MG tablet Take 1 tablet (75 mg total) by mouth daily.  . isosorbide mononitrate (IMDUR) 60 MG 24 hr tablet Take 1 tablet (60 mg total) by mouth daily. May take an additional half tablet as needed for elevated systolic blood pressure >026.  Marland Kitchen lactulose (CHRONULAC) 10 GM/15ML solution Take 45 mLs (30 g total) by mouth daily as needed for severe constipation.  . meclizine (ANTIVERT) 25 MG tablet Take 25 mg by mouth 3 (three) times daily as needed for dizziness.  . metoCLOPramide (REGLAN) 10 MG tablet Take 1 tablet (10 mg total) by mouth 4 (four) times daily -  before meals and at bedtime.  . metoprolol tartrate (LOPRESSOR) 100 MG tablet Take 1 tablet (100 mg total) by mouth 2 (two) times daily.  . mirtazapine (REMERON) 30 MG tablet Take 2 tablets (60 mg total) by mouth at bedtime.  . nitroGLYCERIN (NITROSTAT) 0.4 MG SL tablet Take 1 tab under your tongue while sitting for chest pain,  If no relief may repeat, one tab every 5 min up to 3 tabs total over 15 mins.  . pantoprazole (PROTONIX) 40 MG tablet Take 1 tablet (40 mg total) by mouth 2 (two) times daily.  . prochlorperazine (COMPAZINE) 5 MG tablet TAKE 1 TABLET BY MOUTH EVERY 6 HOURS AS NEEDED FOR NAUSEA AND VOMITING  . promethazine (PHENERGAN) 12.5 MG tablet TAKE 1 TABLET BY MOUTH EVERY 6 HOURS AS NEEDED FOR NAUSEA AND VOMITING  . promethazine (PHENERGAN) 25 MG suppository Place 1 suppository (25  mg total) rectally every 6 (six) hours as needed for nausea or vomiting.  . senna-docusate (SENOKOT-S) 8.6-50 MG tablet Take 1 tablet by mouth 2 (two) times daily.  . [DISCONTINUED] clopidogrel (PLAVIX) 75 MG tablet Take 1 tablet (75 mg total) by mouth daily.  . [DISCONTINUED] irbesartan (AVAPRO) 75 MG tablet Take 75 mg by mouth daily.  . [DISCONTINUED] isosorbide mononitrate (IMDUR) 30 MG 24 hr tablet Take 2 tablets (60 mg total) by mouth daily.  . [DISCONTINUED] metoprolol tartrate (LOPRESSOR) 100 MG tablet Take 1 tablet (100 mg total) by mouth 2 (two) times daily.    Review of Systems      Review of Systems  Constitution: Negative for chills, fever and malaise/fatigue.  HENT:       (+) hears pulse in ears with high BP  Cardiovascular: Negative for chest pain, dyspnea on exertion, irregular heartbeat, leg swelling, near-syncope, orthopnea, palpitations and syncope.       (+) elevated blood pressures (+) headaches  Respiratory: Negative for cough, shortness of breath and wheezing.   Gastrointestinal: Negative for melena, nausea and vomiting.  Genitourinary: Negative for hematuria.  Neurological: Positive for light-headedness (with low BP). Negative for dizziness and weakness.   All other systems reviewed and are otherwise negative except as noted above.  Physical Exam    VS:  BP 124/78 (BP Location: Left Arm, Patient Position: Sitting, Cuff Size: Normal)   Pulse 86   Ht 5\' 5"  (1.651 m)   Wt 235 lb (106.6 kg)   SpO2  98%   BMI 39.11 kg/m  , BMI Body mass index is 39.11 kg/m. GEN: Well nourished, overweight, well developed, in no acute distress. HEENT: normal. Neck: Supple, no JVD, carotid bruits, or masses. Cardiac: RRR, no murmurs, rubs, or gallops. No clubbing, cyanosis, edema.  Radials/DP/PT 2+ and equal bilaterally.  Respiratory:  Respirations regular and unlabored, clear to auscultation bilaterally. GI: Soft, nontender, nondistended, BS + x 4. MS: No deformity or atrophy. Skin: Warm and dry, no rash. Neuro:  Strength and sensation are intact. Psych: Normal affect.  Accessory Clinical Findings    ECG personally reviewed by me today - SR 86 bpm  - no acute changes.  Assessment & Plan    1. CAD - Stable with no anginal symptoms. No indication for ischemic evaluation at this time. GDMT includes DAPT (Aspirin, Plavix), beta blocker. Recommended for lifelong DAPT as tolerated. Reports no bleeding complications. No statin secondary to intolerance.   2. Chronic diastolic congestive heart failure - Euvolemic and well compensated on exam. NYHA I-II with mild DOE (likely deconditioning). GDMT includes beta blocker, ARB, PRN loop diuretic. Low sodium diet and regular cardiovascular exercise recommended.   3. HTN - BP well controlled in office today - of note, has not taken morning medications yet. High BP at home in the setting of pain. Intermittent episodes hypotension at home, hesitant to increase daily regimen. Continue present antihypertensive regimen including Metoprolol tartrate 100mg  BID, Irbesartan 75mg  daily, and Imdur 60mg  daily. Refills provided. She may take an extra half-tablet of Imdur as needed for elevated SBP >180. Recommended she check BP twice one hour apart prior to taking extra antihypertensive.   4. HLD, LDL goal <70 - Statin intolerant with myalgias. Presently on Praluent. Follows with PCP.   5. CKD III - Follows with nephrology.  Careful titration of antihypertensives and diuretics.  Creatinine 12/15/19 1.79, GFR 41 - at approximate baseline.    6. DM2 - Follows with PCP. Appreciate inclusion of SGLT2i  for cardioprotective benefit.   Disposition: Follow up in 6 month(s) with Dr. Tor Netters, NP 03/27/2020, 10:26 AM

## 2020-03-27 ENCOUNTER — Other Ambulatory Visit: Payer: Self-pay

## 2020-03-27 ENCOUNTER — Ambulatory Visit (INDEPENDENT_AMBULATORY_CARE_PROVIDER_SITE_OTHER): Payer: Medicare Other | Admitting: Family

## 2020-03-27 ENCOUNTER — Encounter: Payer: Self-pay | Admitting: Family

## 2020-03-27 VITALS — BP 124/78 | HR 86 | Ht 65.0 in | Wt 235.0 lb

## 2020-03-27 DIAGNOSIS — I2510 Atherosclerotic heart disease of native coronary artery without angina pectoris: Secondary | ICD-10-CM

## 2020-03-27 DIAGNOSIS — E1165 Type 2 diabetes mellitus with hyperglycemia: Secondary | ICD-10-CM

## 2020-03-27 DIAGNOSIS — N1831 Chronic kidney disease, stage 3a: Secondary | ICD-10-CM

## 2020-03-27 DIAGNOSIS — E785 Hyperlipidemia, unspecified: Secondary | ICD-10-CM | POA: Diagnosis not present

## 2020-03-27 DIAGNOSIS — Z794 Long term (current) use of insulin: Secondary | ICD-10-CM

## 2020-03-27 DIAGNOSIS — R8290 Unspecified abnormal findings in urine: Secondary | ICD-10-CM | POA: Diagnosis not present

## 2020-03-27 DIAGNOSIS — I5032 Chronic diastolic (congestive) heart failure: Secondary | ICD-10-CM

## 2020-03-27 DIAGNOSIS — I10 Essential (primary) hypertension: Secondary | ICD-10-CM | POA: Diagnosis not present

## 2020-03-27 DIAGNOSIS — N179 Acute kidney failure, unspecified: Secondary | ICD-10-CM | POA: Diagnosis not present

## 2020-03-27 MED ORDER — IRBESARTAN 75 MG PO TABS: 75.0000 mg | ORAL_TABLET | Freq: Every day | ORAL | 3 refills | Status: DC

## 2020-03-27 MED ORDER — METOPROLOL TARTRATE 100 MG PO TABS: 100.0000 mg | ORAL_TABLET | Freq: Two times a day (BID) | ORAL | 3 refills | Status: DC

## 2020-03-27 MED ORDER — CLOPIDOGREL BISULFATE 75 MG PO TABS: 75.0000 mg | ORAL_TABLET | Freq: Every day | ORAL | 3 refills | Status: DC

## 2020-03-27 MED ORDER — ISOSORBIDE MONONITRATE ER 60 MG PO TB24: 60.0000 mg | ORAL_TABLET | Freq: Every day | ORAL | 3 refills | Status: DC

## 2020-03-27 NOTE — Patient Instructions (Signed)
Medication Instructions:  Refills provided today.  You may take an extra half tablet of your Imdur for elevated blood pressures more than 180. Would recommend checking your blood pressure twice about one hour apart prior to taking.   *If you need a refill on your cardiac medications before your next appointment, please call your pharmacy*   Lab Work: None ordered today.  If you have labs (blood work) drawn today and your tests are completely normal, you will receive your results only by: Marland Kitchen MyChart Message (if you have MyChart) OR . A paper copy in the mail If you have any lab test that is abnormal or we need to change your treatment, we will call you to review the results.   Testing/Procedures: Your EKG today shows normal sinus rhythm - this is a great result!  Follow-Up: At [facility], you and your health needs are our priority.  As part of our continuing mission to provide you with exceptional heart care, we have created designated Provider Care Teams.  These Care Teams include your primary Cardiologist (physician) and Advanced Practice Providers (APPs -  Physician Assistants and Nurse Practitioners) who all work together to provide you with the care you need, when you need it.  We recommend signing up for the patient portal called "MyChart".  Sign up information is provided on this After Visit Summary.  MyChart is used to connect with patients for Virtual Visits (Telemedicine).  Patients are able to view lab/test results, encounter notes, upcoming appointments, etc.  Non-urgent messages can be sent to your provider as well.   To learn more about what you can do with MyChart, go to [email or web address].    Your next appointment:   6 month(s)  The format for your next appointment:   In Person  Provider:    You may see Kathlyn Sacramento, MD or one of the following Advanced Practice Providers on your designated Care Team:    Murray Hodgkins, NP  Christell Faith, PA-C  Marrianne Mood, PA-C   Other Instructions  DASH Eating Plan DASH stands for "Dietary Approaches to Stop Hypertension." The DASH eating plan is a healthy eating plan that has been shown to reduce high blood pressure (hypertension). It may also reduce your risk for type 2 diabetes, heart disease, and stroke. The DASH eating plan may also help with weight loss. What are tips for following this plan?  General guidelines  Avoid eating more than 2,300 mg (milligrams) of salt (sodium) a day. If you have hypertension, you may need to reduce your sodium intake to 1,500 mg a day.  Limit alcohol intake to no more than 1 drink a day for nonpregnant women and 2 drinks a day for men. One drink equals 12 oz of beer, 5 oz of wine, or 1 oz of hard liquor.  Work with your health care provider to maintain a healthy body weight or to lose weight. Ask what an ideal weight is for you.  Get at least 30 minutes of exercise that causes your heart to beat faster (aerobic exercise) most days of the week. Activities may include walking, swimming, or biking.  Work with your health care provider or diet and nutrition specialist (dietitian) to adjust your eating plan to your individual calorie needs. Reading food labels   Check food labels for the amount of sodium per serving. Choose foods with less than 5 percent of the Daily Value of sodium. Generally, foods with less than 300 mg of sodium per serving fit  into this eating plan.  To find whole grains, look for the word "whole" as the first word in the ingredient list. Shopping  Buy products labeled as "low-sodium" or "no salt added."  Buy fresh foods. Avoid canned foods and premade or frozen meals. Cooking  Avoid adding salt when cooking. Use salt-free seasonings or herbs instead of table salt or sea salt. Check with your health care provider or pharmacist before using salt substitutes.  Do not fry foods. Cook foods using healthy methods such as baking, boiling,  grilling, and broiling instead.  Cook with heart-healthy oils, such as olive, canola, soybean, or sunflower oil. Meal planning  Eat a balanced diet that includes: ? 5 or more servings of fruits and vegetables each day. At each meal, try to fill half of your plate with fruits and vegetables. ? Up to 6-8 servings of whole grains each day. ? Less than 6 oz of lean meat, poultry, or fish each day. A 3-oz serving of meat is about the same size as a deck of cards. One egg equals 1 oz. ? 2 servings of low-fat dairy each day. ? A serving of nuts, seeds, or beans 5 times each week. ? Heart-healthy fats. Healthy fats called Omega-3 fatty acids are found in foods such as flaxseeds and coldwater fish, like sardines, salmon, and mackerel.  Limit how much you eat of the following: ? Canned or prepackaged foods. ? Food that is high in trans fat, such as fried foods. ? Food that is high in saturated fat, such as fatty meat. ? Sweets, desserts, sugary drinks, and other foods with added sugar. ? Full-fat dairy products.  Do not salt foods before eating.  Try to eat at least 2 vegetarian meals each week.  Eat more home-cooked food and less restaurant, buffet, and fast food.  When eating at a restaurant, ask that your food be prepared with less salt or no salt, if possible. What foods are recommended? The items listed may not be a complete list. Talk with your dietitian about what dietary choices are best for you. Grains Whole-grain or whole-wheat bread. Whole-grain or whole-wheat pasta. Brown rice. [place]. Bulgur. Whole-grain and low-sodium cereals. Pita bread. Low-fat, low-sodium crackers. Whole-wheat flour tortillas. Vegetables Fresh or frozen vegetables (raw, steamed, roasted, or grilled). Low-sodium or reduced-sodium tomato and vegetable juice. Low-sodium or reduced-sodium tomato sauce and tomato paste. Low-sodium or reduced-sodium canned vegetables. Fruits All fresh, dried, or frozen  fruit. Canned fruit in natural juice (without added sugar). Meat and other protein foods Skinless chicken or [place]. Ground chicken or [place]. Pork with fat trimmed off. Fish and seafood. Egg whites. Dried beans, peas, or lentils. Unsalted nuts, nut butters, and seeds. Unsalted canned beans. Lean cuts of beef with fat trimmed off. Low-sodium, lean deli meat. Dairy Low-fat (1%) or fat-free (skim) milk. Fat-free, low-fat, or reduced-fat cheeses. Nonfat, low-sodium ricotta or cottage cheese. Low-fat or nonfat yogurt. Low-fat, low-sodium cheese. Fats and oils Soft margarine without trans fats. Vegetable oil. Low-fat, reduced-fat, or light mayonnaise and salad dressings (reduced-sodium). Canola, safflower, olive, soybean, and sunflower oils. Avocado. Seasoning and other foods Herbs. Spices. Seasoning mixes without salt. Unsalted popcorn and pretzels. Fat-free sweets. What foods are not recommended? The items listed may not be a complete list. Talk with your dietitian about what dietary choices are best for you. Grains Baked goods made with fat, such as croissants, muffins, or some breads. Dry pasta or rice meal packs. Vegetables Creamed or fried vegetables. Vegetables in a cheese  sauce. Regular canned vegetables (not low-sodium or reduced-sodium). Regular canned tomato sauce and paste (not low-sodium or reduced-sodium). Regular tomato and vegetable juice (not low-sodium or reduced-sodium). Angie Fava. Olives. Fruits Canned fruit in a light or heavy syrup. Fried fruit. Fruit in cream or butter sauce. Meat and other protein foods Fatty cuts of meat. Ribs. Fried meat. Berniece Salines. Sausage. Bologna and other processed lunch meats. Salami. Fatback. Hotdogs. Bratwurst. Salted nuts and seeds. Canned beans with added salt. Canned or smoked fish. Whole eggs or egg yolks. Chicken or [place] with skin. Dairy Whole or 2% milk, cream, and half-and-half. Whole or full-fat cream cheese. Whole-fat or sweetened yogurt. Full-fat  cheese. Nondairy creamers. Whipped toppings. Processed cheese and cheese spreads. Fats and oils Butter. Stick margarine. Lard. Shortening. Ghee. Bacon fat. Tropical oils, such as coconut, palm kernel, or palm oil. Seasoning and other foods Salted popcorn and pretzels. Onion salt, garlic salt, seasoned salt, table salt, and sea salt. Worcestershire sauce. Tartar sauce. Barbecue sauce. Teriyaki sauce. Soy sauce, including reduced-sodium. Steak sauce. Canned and packaged gravies. Fish sauce. Oyster sauce. Cocktail sauce. Horseradish that you find on the shelf. Ketchup. Mustard. Meat flavorings and tenderizers. Bouillon cubes. Hot sauce and Tabasco sauce. Premade or packaged marinades. Premade or packaged taco seasonings. Relishes. Regular salad dressings. Where to find more information:  National Heart, Lung, and [place]: https://wilson-eaton.com/  American Heart Association: www.heart.org Summary  The DASH eating plan is a healthy eating plan that has been shown to reduce high blood pressure (hypertension). It may also reduce your risk for type 2 diabetes, heart disease, and stroke.  With the DASH eating plan, you should limit salt (sodium) intake to 2,300 mg a day. If you have hypertension, you may need to reduce your sodium intake to 1,500 mg a day.  When on the DASH eating plan, aim to eat more fresh fruits and vegetables, whole grains, lean proteins, low-fat dairy, and heart-healthy fats.  Work with your health care provider or diet and nutrition specialist (dietitian) to adjust your eating plan to your individual calorie needs. This information is not intended to replace advice given to you by your health care provider. Make sure you discuss any questions you have with your health care provider. Document Revised: 11/07/2017 Document Reviewed: 11/18/2016 Elsevier Patient Education  2020 [employer].

## 2020-03-31 DIAGNOSIS — E113512 Type 2 diabetes mellitus with proliferative diabetic retinopathy with macular edema, left eye: Secondary | ICD-10-CM | POA: Diagnosis not present

## 2020-04-03 DIAGNOSIS — R801 Persistent proteinuria, unspecified: Secondary | ICD-10-CM | POA: Diagnosis not present

## 2020-04-03 DIAGNOSIS — N1832 Chronic kidney disease, stage 3b: Secondary | ICD-10-CM | POA: Diagnosis not present

## 2020-04-03 DIAGNOSIS — I10 Essential (primary) hypertension: Secondary | ICD-10-CM | POA: Diagnosis not present

## 2020-04-03 DIAGNOSIS — E1129 Type 2 diabetes mellitus with other diabetic kidney complication: Secondary | ICD-10-CM | POA: Diagnosis not present

## 2020-04-14 DIAGNOSIS — E113512 Type 2 diabetes mellitus with proliferative diabetic retinopathy with macular edema, left eye: Secondary | ICD-10-CM | POA: Diagnosis not present

## 2020-05-12 DIAGNOSIS — E113512 Type 2 diabetes mellitus with proliferative diabetic retinopathy with macular edema, left eye: Secondary | ICD-10-CM | POA: Diagnosis not present

## 2020-06-06 ENCOUNTER — Telehealth: Payer: Self-pay | Admitting: Nurse Practitioner

## 2020-06-06 NOTE — Telephone Encounter (Signed)
Copied from [place] [id number]. Topic: Medicare AWV >> Jun 06, 2020  1:37 PM Cher Nakai R wrote: Reason for CRM:  Left message for patient to call back and schedule Medicare Annual Wellness Visit (AWV) to be done virtually.  No hx of AWV per [place] 12/10/2019  Please schedule at anytime with [facility]-Nurse Health Advisor.      20 Minutes appointment

## 2020-06-07 ENCOUNTER — Other Ambulatory Visit: Payer: Self-pay | Admitting: Nurse Practitioner

## 2020-06-14 DIAGNOSIS — E875 Hyperkalemia: Secondary | ICD-10-CM | POA: Diagnosis not present

## 2020-06-14 DIAGNOSIS — E1159 Type 2 diabetes mellitus with other circulatory complications: Secondary | ICD-10-CM | POA: Diagnosis not present

## 2020-06-14 DIAGNOSIS — E669 Obesity, unspecified: Secondary | ICD-10-CM | POA: Diagnosis not present

## 2020-06-14 DIAGNOSIS — Z794 Long term (current) use of insulin: Secondary | ICD-10-CM | POA: Diagnosis not present

## 2020-06-14 DIAGNOSIS — E1122 Type 2 diabetes mellitus with diabetic chronic kidney disease: Secondary | ICD-10-CM | POA: Diagnosis not present

## 2020-06-14 DIAGNOSIS — E1169 Type 2 diabetes mellitus with other specified complication: Secondary | ICD-10-CM | POA: Diagnosis not present

## 2020-06-14 DIAGNOSIS — E11311 Type 2 diabetes mellitus with unspecified diabetic retinopathy with macular edema: Secondary | ICD-10-CM | POA: Diagnosis not present

## 2020-06-14 DIAGNOSIS — E1165 Type 2 diabetes mellitus with hyperglycemia: Secondary | ICD-10-CM | POA: Diagnosis not present

## 2020-06-14 DIAGNOSIS — E1143 Type 2 diabetes mellitus with diabetic autonomic (poly)neuropathy: Secondary | ICD-10-CM | POA: Diagnosis not present

## 2020-06-14 DIAGNOSIS — N1832 Chronic kidney disease, stage 3b: Secondary | ICD-10-CM | POA: Diagnosis not present

## 2020-06-19 DIAGNOSIS — E875 Hyperkalemia: Secondary | ICD-10-CM | POA: Diagnosis not present

## 2020-06-21 ENCOUNTER — Other Ambulatory Visit: Payer: Self-pay

## 2020-06-21 ENCOUNTER — Other Ambulatory Visit: Payer: Self-pay | Admitting: Nephrology

## 2020-06-21 ENCOUNTER — Other Ambulatory Visit
Admission: RE | Admit: 2020-06-21 | Discharge: 2020-06-21 | Disposition: A | Discharge status: Home / Self Care | Payer: Medicare Other | Attending: Nephrology | Admitting: Nephrology

## 2020-06-21 DIAGNOSIS — E875 Hyperkalemia: Secondary | ICD-10-CM

## 2020-06-21 DIAGNOSIS — E785 Hyperlipidemia, unspecified: Secondary | ICD-10-CM | POA: Diagnosis not present

## 2020-06-21 LAB — BASIC METABOLIC PANEL
Anion gap: 6 (ref 5–15)
BUN: 17 mg/dL (ref 6–20)
CO2: 24 mmol/L (ref 22–32)
Calcium: 8.3 mg/dL — ABNORMAL LOW (ref 8.9–10.3)
Chloride: 106 mmol/L (ref 98–111)
Creatinine, Ser: 1.5 mg/dL — ABNORMAL HIGH (ref 0.44–1.00)
GFR calc Af Amer: 50 mL/min — ABNORMAL LOW (ref >60)
GFR calc non Af Amer: 43 mL/min — ABNORMAL LOW (ref >60)
Glucose, Bld: 145 mg/dL — ABNORMAL HIGH (ref 70–99)
Potassium: 5 mmol/L (ref 3.5–5.1)
Sodium: 136 mmol/L (ref 135–145)

## 2020-06-28 ENCOUNTER — Telehealth: Payer: Self-pay | Admitting: Nurse Practitioner

## 2020-06-28 NOTE — Telephone Encounter (Signed)
Copied from [place] [id number]. Topic: Medicare AWV >> Jun 28, 2020  3:03 PM Cher Nakai R wrote: Reason for CRM:  Left message for patient to call back and schedule Medicare Annual Wellness Visit (AWV) to be done virtually.  No hx of AWV  Please schedule at anytime with [facility] Health Advisor.      81 Minutes appointment

## 2020-07-07 DIAGNOSIS — E113512 Type 2 diabetes mellitus with proliferative diabetic retinopathy with macular edema, left eye: Secondary | ICD-10-CM | POA: Diagnosis not present

## 2020-07-28 DIAGNOSIS — E113551 Type 2 diabetes mellitus with stable proliferative diabetic retinopathy, right eye: Secondary | ICD-10-CM | POA: Diagnosis not present

## 2020-08-11 DIAGNOSIS — E113512 Type 2 diabetes mellitus with proliferative diabetic retinopathy with macular edema, left eye: Secondary | ICD-10-CM | POA: Diagnosis not present

## 2020-08-11 DIAGNOSIS — E113551 Type 2 diabetes mellitus with stable proliferative diabetic retinopathy, right eye: Secondary | ICD-10-CM | POA: Diagnosis not present

## 2020-08-30 DIAGNOSIS — H4311 Vitreous hemorrhage, right eye: Secondary | ICD-10-CM | POA: Diagnosis not present

## 2020-09-01 ENCOUNTER — Other Ambulatory Visit: Payer: Self-pay | Admitting: Nurse Practitioner

## 2020-09-18 ENCOUNTER — Ambulatory Visit (INDEPENDENT_AMBULATORY_CARE_PROVIDER_SITE_OTHER): Payer: Medicare Other

## 2020-09-18 VITALS — Ht 65.0 in | Wt 239.0 lb

## 2020-09-18 DIAGNOSIS — N1832 Chronic kidney disease, stage 3b: Secondary | ICD-10-CM | POA: Diagnosis not present

## 2020-09-18 DIAGNOSIS — E669 Obesity, unspecified: Secondary | ICD-10-CM | POA: Diagnosis not present

## 2020-09-18 DIAGNOSIS — Z794 Long term (current) use of insulin: Secondary | ICD-10-CM | POA: Diagnosis not present

## 2020-09-18 DIAGNOSIS — E11311 Type 2 diabetes mellitus with unspecified diabetic retinopathy with macular edema: Secondary | ICD-10-CM | POA: Diagnosis not present

## 2020-09-18 DIAGNOSIS — Z0000 Encounter for general adult medical examination without abnormal findings: Secondary | ICD-10-CM | POA: Diagnosis not present

## 2020-09-18 DIAGNOSIS — E1122 Type 2 diabetes mellitus with diabetic chronic kidney disease: Secondary | ICD-10-CM | POA: Diagnosis not present

## 2020-09-18 DIAGNOSIS — E1169 Type 2 diabetes mellitus with other specified complication: Secondary | ICD-10-CM | POA: Diagnosis not present

## 2020-09-18 DIAGNOSIS — E1143 Type 2 diabetes mellitus with diabetic autonomic (poly)neuropathy: Secondary | ICD-10-CM | POA: Diagnosis not present

## 2020-09-18 DIAGNOSIS — E1165 Type 2 diabetes mellitus with hyperglycemia: Secondary | ICD-10-CM | POA: Diagnosis not present

## 2020-09-18 DIAGNOSIS — E1159 Type 2 diabetes mellitus with other circulatory complications: Secondary | ICD-10-CM | POA: Diagnosis not present

## 2020-09-18 NOTE — Progress Notes (Signed)
I connected with [name] today by telephone and verified that I am speaking with the correct person using two identifiers. Location patient: home Location provider: work Persons participating in the virtual visit: Shaila, [place] LPN.   I discussed the limitations, risks, security and privacy concerns of performing an evaluation and management service by telephone and the availability of in person appointments. I also discussed with the patient that there may be a patient responsible charge related to this service. The patient expressed understanding and verbally consented to this telephonic visit.    Interactive audio and video telecommunications were attempted between this provider and patient, however failed, due to patient having technical difficulties OR patient did not have access to video capability.  We continued and completed visit with audio only.     Vital signs may be patient reported or missing.  Subjective:   [name] [name] is a 39 y.o. female who presents for an Initial Medicare Annual Wellness Visit.  Review of Systems     Cardiac Risk Factors include: diabetes mellitus;dyslipidemia;hypertension;obesity (BMI >30kg/m2);sedentary lifestyle     Objective:    Today's Vitals   09/18/20 1341  Weight: 239 lb (108.4 kg)  Height: 5\' 5"  (1.651 m)   Body mass index is 39.77 kg/m.  Advanced Directives 09/18/2020 11/20/2019 07/23/2019 07/22/2019 06/25/2019 06/18/2019 04/17/2019  Does Patient Have a Medical Advance Directive? No No No No No No No  Would patient like information on creating a medical advance directive? - No - Patient declined No - Patient declined - No - Patient declined No - Patient declined No - Patient declined    Current Medications (verified) Outpatient Encounter Medications as of 09/18/2020  Medication Sig  . ACCU-CHEK AVIVA PLUS test strip USE 1 STRIP TO CHECK GLUCOSE THREE TIMES DAILY  . amitriptyline (ELAVIL) 25 MG tablet Take 25 mg  by mouth at bedtime.  Marland Kitchen aspirin EC 81 MG tablet Take 81 mg by mouth daily.  . Cholecalciferol 2000 units TABS Take by mouth daily.   . clopidogrel (PLAVIX) 75 MG tablet Take 1 tablet (75 mg total) by mouth daily.  . dapagliflozin propanediol (FARXIGA) 10 MG TABS tablet Take 10 mg by mouth daily.  . DULoxetine (CYMBALTA) 30 MG capsule Take 60 mg by mouth daily.   Marland Kitchen etonogestrel (NEXPLANON) 68 MG IMPL implant 1 each by Subdermal route once.  Marland Kitchen glimepiride (AMARYL) 4 MG tablet Take 4 mg by mouth daily with breakfast.  . insulin aspart (NOVOLOG) 100 UNIT/ML injection Inject 10 Units into the skin 3 (three) times daily with meals.  . Insulin Glargine (BASAGLAR KWIKPEN) 100 UNIT/ML SOPN Inject 0.3 mLs (30 Units total) into the skin daily.  . irbesartan (AVAPRO) 75 MG tablet Take 1 tablet (75 mg total) by mouth daily.  . isosorbide mononitrate (IMDUR) 60 MG 24 hr tablet Take 1 tablet (60 mg total) by mouth daily. May take an additional half tablet as needed for elevated systolic blood pressure >956.  Marland Kitchen lactulose (CHRONULAC) 10 GM/15ML solution Take 45 mLs (30 g total) by mouth daily as needed for severe constipation.  . meclizine (ANTIVERT) 25 MG tablet Take 25 mg by mouth 3 (three) times daily as needed for dizziness.  . metoprolol tartrate (LOPRESSOR) 100 MG tablet Take 1 tablet (100 mg total) by mouth 2 (two) times daily.  . mirtazapine (REMERON) 30 MG tablet Take 2 tablets (60 mg total) by mouth at bedtime.  . nitroGLYCERIN (NITROSTAT) 0.4 MG SL tablet Take 1 tab under your tongue  while sitting for chest pain, If no relief may repeat, one tab every 5 min up to 3 tabs total over 15 mins.  . pantoprazole (PROTONIX) 40 MG tablet Take 1 tablet by mouth twice daily  . prochlorperazine (COMPAZINE) 5 MG tablet TAKE 1 TABLET BY MOUTH EVERY 6 HOURS AS NEEDED FOR NAUSEA AND VOMITING  . promethazine (PHENERGAN) 12.5 MG tablet TAKE 1 TABLET BY MOUTH EVERY 6 HOURS AS NEEDED FOR NAUSEA AND VOMITING  .  promethazine (PHENERGAN) 25 MG suppository Place 1 suppository (25 mg total) rectally every 6 (six) hours as needed for nausea or vomiting.  . senna-docusate (SENOKOT-S) 8.6-50 MG tablet Take 1 tablet by mouth 2 (two) times daily.  . furosemide (LASIX) 40 MG tablet Take 1 tablet (40 mg total) by mouth every other day.  . metoCLOPramide (REGLAN) 10 MG tablet Take 1 tablet (10 mg total) by mouth 4 (four) times daily -  before meals and at bedtime.   No facility-administered encounter medications on file as of 09/18/2020.    Allergies (verified) Lisinopril, Rosemary oil, Shellfish allergy, Other, and Metformin and related   History: Past Medical History:  Diagnosis Date  . CAD (coronary artery disease)    a. 03/2015 NSTEMI/PCI: LCX 14m (DES), OM1 100p (DES - vessel labeled Ramus in subsequent cath report). Minor irregs to LAD/RCA; b. 04/2015 Cath: LM nl, LAD 40p, RI patent stent, LCX patent stent, RCA nl, EF 55-65%; c. 02/2016 MV: basal inferolateral and mid inferolateral defect/infarct w/o ischemia. EF 55-65%; d. 11/2018 PCI: LM 30ost, LAD 30p, LCX 95p/m (PTCA->20%), OM1 10ost, OM2 40ost, RCA 85p.  Marland Kitchen Chronic abdominal pain   . Chronic diastolic (congestive) heart failure ([place])    a. 03/2015 Echo: EF 30-35%, mild concentric LVH, severe HK of inf, inflat, & lat walls, mod MR, mild TR;  b. 04/2015 LV Gram: EF 55-65%; c. 09/2017 Echo: EF 55-60%, no rwma. Mild MR; d. 11/2018 Echo: Ef 60-65%, no rwma.  . CKD (chronic kidney disease), stage III ([place])   . Diabetes type 2, controlled ([place])    a. since 2002   . Diabetic gastroparesis ([facility])    a. chronic nausea/vomiting.  . Diverticulosis, sigmoid 07/2016  . Heavy menses    a. H/O IUD - expired in 2014 - remains in place.  Marland Kitchen Hx of migraines   . Hyperlipidemia    Intolerant of atorvastatin, rosuvastatin  . Hypertension   . Iron deficiency anemia   . Ischemic cardiomyopathy (resolved)    a. 03/2015 EF 30-35% post NSTEMI;  b. 04/2015 EF 55-65% on LV gram;  c. 09/2017 Echo: EF 55-60%; d. 11/2018 Echo: Ef 60-65%.  . Obesity   . Tobacco abuse    a. quit 03/2015.  Marland Kitchen Uterine fibroid   . Vertigo   . Vitamin D deficiency    Past Surgical History:  Procedure Laterality Date  . APPENDECTOMY    . CARDIAC CATHETERIZATION  4/16   x2 stent [facility]  . CARDIAC CATHETERIZATION N/A 05/05/2015   Procedure: Left Heart Cath and Coronary Angiography;  Surgeon: Peter M Martinique, MD;  Location: [place] CV [facility];  Service: Cardiovascular;  Laterality: N/A;  . COLONOSCOPY WITH PROPOFOL N/A 07/31/2016   Procedure: COLONOSCOPY WITH PROPOFOL;  Surgeon: Lollie Sails, MD;  Location: [facility] ENDOSCOPY;  Service: Endoscopy;  Laterality: N/A;  . CORONARY BALLOON ANGIOPLASTY N/A 12/04/2018   Procedure: CORONARY BALLOON ANGIOPLASTY;  Surgeon: Wellington Hampshire, MD;  Location: [place] CV LAB;  Service: Cardiovascular;  Laterality: N/A;  .  CORONARY STENT INTERVENTION N/A 01/12/2019   Procedure: CORONARY STENT INTERVENTION;  Surgeon: [name], MD;  Location: [place] [facility] LAB;  Service: Cardiovascular;  Laterality: N/A;  . ESOPHAGOGASTRODUODENOSCOPY (EGD) WITH PROPOFOL N/A 07/31/2016   Procedure: ESOPHAGOGASTRODUODENOSCOPY (EGD) WITH PROPOFOL;  Surgeon: Lollie Sails, MD;  Location: [facility] ENDOSCOPY;  Service: Endoscopy;  Laterality: N/A;  . INTRAVASCULAR PRESSURE WIRE/FFR STUDY N/A 12/04/2018   Procedure: INTRAVASCULAR PRESSURE WIRE/FFR STUDY;  Surgeon: Wellington Hampshire, MD;  Location: [place] [facility] LAB;  Service: Cardiovascular;  Laterality: N/A;  . LAPAROSCOPIC APPENDECTOMY N/A 08/07/2016   Procedure: APPENDECTOMY LAPAROSCOPIC;  Surgeon: Hubbard Robinson, MD;  Location: [facility] ORS;  Service: General;  Laterality: N/A;  . LEFT HEART CATH AND CORONARY ANGIOGRAPHY Left 12/04/2018   Procedure: LEFT HEART CATH AND CORONARY ANGIOGRAPHY;  Surgeon: Wellington Hampshire, MD;  Location: [place] CV LAB;  Service: Cardiovascular;  Laterality: Left;  . LEFT HEART CATH  AND CORONARY ANGIOGRAPHY N/A 01/12/2019   Procedure: LEFT HEART CATH AND CORONARY ANGIOGRAPHY;  Surgeon: [name], MD;  Location: [place] [facility];  Service: Cardiovascular;  Laterality: N/A;  . MOUTH SURGERY     Family History  Problem Relation Age of Onset  . Diabetes Father   . Cancer Maternal Grandmother        lung  . Cancer Maternal Grandfather        prostate  . Diabetes Paternal Grandfather   . Diabetes Paternal Grandmother   . Cancer - Cervical Maternal Aunt    Social History   Socioeconomic History  . Marital status: Married    Spouse name: Loss adjuster, chartered  . Number of children: Not on file  . Years of education: Not on file  . Highest education level: Not on file  Occupational History  . Not on file  Tobacco Use  . Smoking status: Former Smoker    Years: 15.00    Quit date: 03/10/2015    Years since quitting: 5.5  . Smokeless tobacco: Never Used  Vaping Use  . Vaping Use: Never used  Substance and Sexual Activity  . Alcohol use: No    Alcohol/week: 0.0 standard drinks  . Drug use: Yes    Frequency: 2.0 times per week    Types: Marijuana  . Sexual activity: Yes    Birth control/protection: Implant  Other Topics Concern  . Not on file  Social History Narrative   Lives locally with daughter and husband.   Social Determinants of Health   Financial Resource Strain: Medium Risk  . Difficulty of Paying Living Expenses: Somewhat hard  Food Insecurity: No Food Insecurity  . Worried About Charity fundraiser in the Last Year: Never true  . Ran Out of Food in the Last Year: Never true  Transportation Needs: No Transportation Needs  . Lack of Transportation (Medical): No  . Lack of Transportation (Non-Medical): No  Physical Activity: Insufficiently Active  . Days of Exercise per Week: 7 days  . Minutes of Exercise per Session: 20 min  Stress: Stress Concern Present  . Feeling of Stress : To some extent  Social Connections:   . Frequency of Communication with  Friends and Family: Not on file  . Frequency of Social Gatherings with Friends and Family: Not on file  . Attends Religious Services: Not on file  . Active Member of Clubs or Organizations: Not on file  . Attends Archivist Meetings: Not on file  . Marital Status: Not on file  Tobacco Counseling Counseling given: Not Answered   Clinical Intake:  Pre-visit preparation completed: Yes  Pain : No/denies pain     Nutritional Status: BMI > 30  Obese Nutritional Risks: None Diabetes: Yes  How often do you need to have someone help you when you read instructions, pamphlets, or other written materials from your doctor or pharmacy?: 1 - Never What is the last grade level you completed in school?: some college  Diabetic? Yes Nutrition Risk Assessment:  Has the patient had any N/V/D within the last 2 months?  No  Does the patient have any non-healing wounds?  No  Has the patient had any unintentional weight loss or weight gain?  No   Diabetes:  Is the patient diabetic?  Yes  If diabetic, was a CBG obtained today?  No  Did the patient bring in their glucometer from home?  No  How often do you monitor your CBG's? Many times a day.   Financial Strains and Diabetes Management:  Are you having any financial strains with the device, your supplies or your medication? No .  Does the patient want to be seen by Chronic Care Management for management of their diabetes?  No  Would the patient like to be referred to a Nutritionist or for Diabetic Management?  No   Diabetic Exams:  Diabetic Eye Exam: Overdue for diabetic eye exam. Pt has been advised about the importance in completing this exam. Patient advised to call and schedule an eye exam. Diabetic Foot Exam: Overdue, Pt has been advised about the importance in completing this exam. Pt is scheduled for diabetic foot exam on next appointment.  Interpreter Needed?: No  Information entered by :: NAllen LPN   Activities of  Daily Living In your present state of health, do you have any difficulty performing the following activities: 09/18/2020  Hearing? N  Vision? Y  Comment floaters  Difficulty concentrating or making decisions? N  Walking or climbing stairs? N  Dressing or bathing? N  Doing errands, shopping? N  Preparing Food and eating ? N  Using the Toilet? N  In the past six months, have you accidently leaked urine? N  Do you have problems with loss of bowel control? N  Managing your Medications? N  Managing your Finances? N  Housekeeping or managing your Housekeeping? N  Some recent data might be hidden    Patient Care Team: Venita Lick, NP as PCP - General (Nurse Practitioner) Wellington Hampshire, MD as PCP - Cardiology (Cardiology) Wellington Hampshire, MD as Consulting Physician (Cardiology) Alisa Graff, FNP as Nurse Practitioner (Family Medicine)  Indicate any recent Medical Services you may have received from other than [facility] providers in the past year (date may be approximate).     Assessment:   This is a routine wellness examination for [place].  Hearing/Vision screen  Hearing Screening   125Hz  250Hz  500Hz  1000Hz  2000Hz  3000Hz  4000Hz  6000Hz  8000Hz   Right ear:           Left ear:           Vision Screening Comments: Regular eye exams, [employer] [facility]  Dietary issues and exercise activities discussed: Current Exercise Habits: Home exercise routine, Type of exercise: walking, Time (Minutes): 20, Frequency (Times/Week): 7, Weekly Exercise (Minutes/Week): 140  Goals    .  "I don't want to have a heart attack" (pt-stated)      Current Barriers:  . Uncontrolled hyperlipidemia, complicated by CAD (NSTEMI, DES in 03/2014, DES  again in 11/2018); cardiologist Dr. Salley Slaughter Submitted prescription and PA for [place] - PA denied  o [place] preferred  . Current antihyperlipidemic regimen: none . Previous antihyperlipidemic medications tried:  atorvastatin 20 mg daily and 40 mg daily; rosuvastatin 5 mg daily and 5 mg every other day; simvastatin 20 mg daily and 40 mg daily; all causing significant muscle pain/myalgias interrupting ADLs . Most recent lipid panel: LDL: 138, TG: 177, TC: 217, HDL: 47 . ASCVD risk enhancing conditions: DM, HTN, CKD, CHF  Pharmacist Clinical Goal(s):  Marland Kitchen Over the next 90 days, patient will work with PharmD and providers towards optimized antihyperlipidemic therapy  Interventions: . [place] - they noted the Pral[place] was covered and showing a $0 copay. They are out of stock; anticipate getting in order tomorrow and being able to fill.  . Contacted patient; left HIPAA compliant message letting her know the above and requesting she contact me when she has picked the medication up.   Patient Self Care Activities:  . Patient will focus on medication adherence  Please see past updates related to this goal by clicking on the "Past Updates" button in the selected goal      .  "I want to have better controlled diabetes" (pt-stated)      Current Barriers:  . Diabetes: uncontrolled, most recent A1c 8.2% o Follows with Dr. Gabriel Carina, endocrinology; last visit 05/2019, visit upcoming 09/10/2019 o Patient interested in CGM   . Current antihyperglycemic regimen: Basaglar 50 units daily, Humalog 14/14/20 units TID w/ meals; Farxiga 10 mg daily, glimepiride 4 mg daily  o Notes that [place] and glimepiride were held on recent discharge after admission w/ AKI, but she has since restarted . Reports daytime hypoglycemia if she doesn't eat, which does happen often d/t significant complaints of nausea . Current glucose results: Fasting 77-100 . Cardiovascular risk reduction- cardiologist Dr. Fletcher Anon o Current hypertensive regimen: metoprolol tartrate 100 mg BID, irbesartan 75 mg BID o Current hyperlipidemia regimen: none; hx intolerance to statins, see hyperlipidemia goal o Antiplatelet regimen: clopidogrel 75  mg daily, ASA 81 mg daily  Pharmacist Clinical Goal(s):  Marland Kitchen Over the next 90 days, patient with work with PharmD and primary care provider to address improved glucose management  Interventions: . Comprehensive medication review performed, medication list updated in electronic medical record . Discussed concern of glimepiride contributing to hypoglycemia during the day if she doesn't eat. Recommended she discuss discontinuation w/ Dr. Gabriel Carina; recommend insulin adjustment instead, as this can be modified based on anticipated carbohydrate intake . Unsure when she self-restarted Farxiga, but renal function is leveling out. Recommend continuation d/t concurrent DM, CKD , and CHF . Discussed that Medicare requires patient to be checking BG at least QID; encouraged patient to start doing this, especially in the few weeks prior to her appointment with Dr. Gabriel Carina, to pursue CGM coverage.   Patient Self Care Activities:  . Patient will check blood glucose QID, document, and provide at future appointments . Patient will take medications as prescribed . Patient will report any questions or concerns to provider   Initial goal documentation      .  Patient Stated      09/18/2020, no goals      Depression Screen PHQ 2/9 Scores 09/18/2020 11/25/2019 01/21/2019 10/06/2018 01/09/2018 08/18/2017 08/12/2017  PHQ - 2 Score 0 2 5 1 3 2 4   PHQ- 9 Score - 13 20 6 18  - 15    Fall  Risk Fall Risk  09/18/2020 11/25/2019 01/21/2019 10/06/2018 08/18/2017  Falls in the past year? 0 1 0 Yes Yes  Number falls in past yr: - 0 - 2 or more 2 or more  Injury with Fall? - 0 - Yes Yes  Risk for fall due to : Medication side effect - - - History of fall(s)  Follow up Falls evaluation completed;Education provided;Falls prevention discussed - - - Falls prevention discussed    Any stairs in or around the home? Yes  If so, are there any without handrails? No  Home free of loose throw rugs in walkways, pet beds, electrical cords,  etc? No  Adequate lighting in your home to reduce risk of falls? No   ASSISTIVE DEVICES UTILIZED TO PREVENT FALLS:  Life alert? No  Use of a cane, walker or w/c? No  Grab bars in the bathroom? Yes  Shower chair or bench in shower? No  Elevated toilet seat or a handicapped toilet? No   TIMED UP AND GO:  Was the test performed? No ..  Cognitive Function:     6CIT Screen 09/18/2020  What Year? 0 points  What month? 0 points  What time? 0 points  Count back from 20 0 points  Months in reverse 0 points  Repeat phrase 0 points  Total Score 0    Immunizations Immunization History  Administered Date(s) Administered  . Influenza,inj,Quad PF,6+ Mos 08/30/2015, 09/02/2016, 08/12/2017, 08/12/2018, 08/18/2019  . Pneumococcal Polysaccharide-23 12/15/2015  . Tdap 07/20/2016    TDAP status: Up to date Flu Vaccine status: Up to date Pneumococcal vaccine status: Up to date Covid-19 vaccine status: Information provided on how to obtain vaccines.   Qualifies for Shingles Vaccine? No   Zostavax completed n/a  Shingrix Completed?: n/a  Screening Tests Health Maintenance  Topic Date Due  . Hepatitis C Screening  Never done  . COVID-19 Vaccine (1) Never done  . FOOT EXAM  12/14/2016  . PAP SMEAR-Modifier  10/01/2018  . OPHTHALMOLOGY EXAM  09/17/2019  . HEMOGLOBIN A1C  06/13/2020  . INFLUENZA VACCINE  07/09/2020  . TETANUS/TDAP  07/20/2026  . PNEUMOCOCCAL POLYSACCHARIDE VACCINE AGE 68-64 HIGH RISK  Completed  . HIV Screening  Completed    Health Maintenance  Health Maintenance Due  Topic Date Due  . Hepatitis C Screening  Never done  . COVID-19 Vaccine (1) Never done  . FOOT EXAM  12/14/2016  . PAP SMEAR-Modifier  10/01/2018  . OPHTHALMOLOGY EXAM  09/17/2019  . HEMOGLOBIN A1C  06/13/2020  . INFLUENZA VACCINE  07/09/2020    Colorectal cancer screening: Completed 07/31/2016. Repeat every 10 years Mammogram status: due Bone Density status: n/a  Lung Cancer Screening:  (Low Dose CT Chest recommended if Age 1-80 years, 30 pack-year currently smoking OR have quit w/in 15years.) does not qualify.   Lung Cancer Screening Referral: no   Additional Screening:  Hepatitis C Screening: does qualify; due  Vision Screening: Recommended annual ophthalmology exams for early detection of glaucoma and other disorders of the eye. Is the patient up to date with their annual eye exam?  No  Who is the provider or what is the name of the office in which the patient attends annual eye exams? [facility] If pt is not established with a provider, would they like to be referred to a provider to establish care? No .   Dental Screening: Recommended annual dental exams for proper oral hygiene  Community Resource Referral / Chronic Care Management: CRR required this  visit?  No   CCM required this visit?  No      Plan:     I have personally reviewed and noted the following in the patient's chart:   . Medical and social history . Use of alcohol, tobacco or illicit drugs  . Current medications and supplements . Functional ability and status . Nutritional status . Physical activity . Advanced directives . List of other physicians . Hospitalizations, surgeries, and ER visits in previous 12 months . Vitals . Screenings to include cognitive, depression, and falls . Referrals and appointments  In addition, I have reviewed and discussed with patient certain preventive protocols, quality metrics, and best practice recommendations. A written personalized care plan for preventive services as well as general preventive health recommendations were provided to patient.     Kellie Simmering, LPN   [id number]/06/[id number]   Nurse Notes:

## 2020-09-18 NOTE — Patient Instructions (Signed)
[name] [name] , Thank you for taking time to come for your Medicare Wellness Visit. I appreciate your ongoing commitment to your health goals. Please review the following plan we discussed and let me know if I can assist you in the future.   Screening recommendations/referrals: Colonoscopy: completed 07/31/2016 Mammogram: due Bone Density: n/a Recommended yearly ophthalmology/optometry visit for glaucoma screening and checkup Recommended yearly dental visit for hygiene and checkup  Vaccinations: Influenza vaccine: due Pneumococcal vaccine: completed 12/15/2015 Tdap vaccine: completed 07/20/2016 Shingles vaccine: n/a  Covid-19: considering  Advanced directives: Advance directive discussed with you today. .   Conditions/risks identified: none  Next appointment: Follow up in one year for your annual wellness visit.   Preventive Care 40-64 Years, Female Preventive care refers to lifestyle choices and visits with your health care provider that can promote health and wellness. What does preventive care include?  A yearly physical exam. This is also called an annual well check.  Dental exams once or twice a year.  Routine eye exams. Ask your health care provider how often you should have your eyes checked.  Personal lifestyle choices, including:  Daily care of your teeth and gums.  Regular physical activity.  Eating a healthy diet.  Avoiding tobacco and drug use.  Limiting alcohol use.  Practicing safe sex.  Taking low-dose aspirin daily starting at age 16.  Taking vitamin and mineral supplements as recommended by your health care provider. What happens during an annual well check? The services and screenings done by your health care provider during your annual well check will depend on your age, overall health, lifestyle risk factors, and family history of disease. Counseling  Your health care provider may ask you questions about your:  Alcohol use.  Tobacco use.  Drug  use.  Emotional well-being.  Home and relationship well-being.  Sexual activity.  Eating habits.  Work and work Astr[employer].  Method of birth control.  Menstrual cycle.  Pregnancy history. Screening  You may have the following tests or measurements:  Height, weight, and BMI.  Blood pressure.  Lipid and cholesterol levels. These may be checked every 5 years, or more frequently if you are over 17 years old.  Skin check.  Lung cancer screening. You may have this screening every year starting at age 56 if you have a 30-pack-year history of smoking and currently smoke or have quit within the past 15 years.  Fecal occult blood test (FOBT) of the stool. You may have this test every year starting at age 61.  Flexible sigmoidoscopy or colonoscopy. You may have a sigmoidoscopy every 5 years or a colonoscopy every 10 years starting at age 89.  Hepatitis C blood test.  Hepatitis B blood test.  Sexually transmitted disease (STD) testing.  Diabetes screening. This is done by checking your blood sugar (glucose) after you have not eaten for a while (fasting). You may have this done every 1-3 years.  Mammogram. This may be done every 1-2 years. Talk to your health care provider about when you should start having regular mammograms. This may depend on whether you have a family history of breast cancer.  BRCA-related cancer screening. This may be done if you have a family history of breast, ovarian, tubal, or peritoneal cancers.  Pelvic exam and Pap test. This may be done every 3 years starting at age 64. Starting at age 18, this may be done every 5 years if you have a Pap test in combination with an HPV test.  Bone density  scan. This is done to screen for osteoporosis. You may have this scan if you are at high risk for osteoporosis. Discuss your test results, treatment options, and if necessary, the need for more tests with your health care provider. Vaccines  Your health care  provider may recommend certain vaccines, such as:  Influenza vaccine. This is recommended every year.  Tetanus, diphtheria, and acellular pertussis (Tdap, Td) vaccine. You may need a Td booster every 10 years.  Zoster vaccine. You may need this after age 25.  Pneumococcal 13-valent conjugate (PCV13) vaccine. You may need this if you have certain conditions and were not previously vaccinated.  Pneumococcal polysaccharide (PPSV23) vaccine. You may need one or two doses if you smoke cigarettes or if you have certain conditions. Talk to your health care provider about which screenings and vaccines you need and how often you need them. This information is not intended to replace advice given to you by your health care provider. Make sure you discuss any questions you have with your health care provider. Document Released: 12/22/2015 Document Revised: 08/14/2016 Document Reviewed: 09/26/2015 Elsevier Interactive Patient Education  2017 [place] Prevention in the Home Falls can cause injuries. They can happen to people of all ages. There are many things you can do to make your home safe and to help prevent falls. What can I do on the outside of my home?  Regularly fix the edges of walkways and driveways and fix any cracks.  Remove anything that might make you trip as you walk through a door, such as a raised step or threshold.  Trim any bushes or trees on the path to your home.  Use bright outdoor lighting.  Clear any walking paths of anything that might make someone trip, such as rocks or tools.  Regularly check to see if handrails are loose or broken. Make sure that both sides of any steps have handrails.  Any raised decks and porches should have guardrails on the edges.  Have any leaves, snow, or ice cleared regularly.  Use sand or salt on walking paths during winter.  Clean up any spills in your garage right away. This includes oil or grease spills. What can I do  in the bathroom?  Use night lights.  Install grab bars by the toilet and in the tub and shower. Do not use towel bars as grab bars.  Use non-skid mats or decals in the tub or shower.  If you need to sit down in the shower, use a plastic, non-slip stool.  Keep the floor dry. Clean up any water that spills on the floor as soon as it happens.  Remove soap buildup in the tub or shower regularly.  Attach bath mats securely with double-sided non-slip rug tape.  Do not have throw rugs and other things on the floor that can make you trip. What can I do in the bedroom?  Use night lights.  Make sure that you have a light by your bed that is easy to reach.  Do not use any sheets or blankets that are too big for your bed. They should not hang down onto the floor.  Have a firm chair that has side arms. You can use this for support while you get dressed.  Do not have throw rugs and other things on the floor that can make you trip. What can I do in the kitchen?  Clean up any spills right away.  Avoid walking on wet floors.  Keep items that you use a lot in easy-to-reach places.  If you need to reach something above you, use a strong step stool that has a grab bar.  Keep electrical cords out of the way.  Do not use floor polish or wax that makes floors slippery. If you must use wax, use non-skid floor wax.  Do not have throw rugs and other things on the floor that can make you trip. What can I do with my stairs?  Do not leave any items on the stairs.  Make sure that there are handrails on both sides of the stairs and use them. Fix handrails that are broken or loose. Make sure that handrails are as long as the stairways.  Check any carpeting to make sure that it is firmly attached to the stairs. Fix any carpet that is loose or worn.  Avoid having throw rugs at the top or bottom of the stairs. If you do have throw rugs, attach them to the floor with carpet tape.  Make sure that you  have a light switch at the top of the stairs and the bottom of the stairs. If you do not have them, ask someone to add them for you. What else can I do to help prevent falls?  Wear shoes that:  Do not have high heels.  Have rubber bottoms.  Are comfortable and fit you well.  Are closed at the toe. Do not wear sandals.  If you use a stepladder:  Make sure that it is fully opened. Do not climb a closed stepladder.  Make sure that both sides of the stepladder are locked into place.  Ask someone to hold it for you, if possible.  Clearly mark and make sure that you can see:  Any grab bars or handrails.  First and last steps.  Where the edge of each step is.  Use tools that help you move around (mobility aids) if they are needed. These include:  Canes.  Walkers.  Scooters.  Crutches.  Turn on the lights when you go into a dark area. Replace any light bulbs as soon as they burn out.  Set up your furniture so you have a clear path. Avoid moving your furniture around.  If any of your floors are uneven, fix them.  If there are any pets around you, be aware of where they are.  Review your medicines with your doctor. Some medicines can make you feel dizzy. This can increase your chance of falling. Ask your doctor what other things that you can do to help prevent falls. This information is not intended to replace advice given to you by your health care provider. Make sure you discuss any questions you have with your health care provider. Document Released: 09/21/2009 Document Revised: 05/02/2016 Document Reviewed: 12/30/2014 Elsevier Interactive Patient Education  2017 [employer].

## 2020-09-28 ENCOUNTER — Other Ambulatory Visit: Payer: Self-pay | Admitting: Nurse Practitioner

## 2020-09-28 MED ORDER — BENZONATATE 100 MG PO CAPS: 100.0000 mg | ORAL_CAPSULE | Freq: Two times a day (BID) | ORAL | 12 refills | Status: DC | PRN

## 2020-10-05 DIAGNOSIS — E1129 Type 2 diabetes mellitus with other diabetic kidney complication: Secondary | ICD-10-CM | POA: Diagnosis not present

## 2020-10-05 DIAGNOSIS — R801 Persistent proteinuria, unspecified: Secondary | ICD-10-CM | POA: Diagnosis not present

## 2020-10-05 DIAGNOSIS — I10 Essential (primary) hypertension: Secondary | ICD-10-CM | POA: Diagnosis not present

## 2020-10-05 DIAGNOSIS — N1832 Chronic kidney disease, stage 3b: Secondary | ICD-10-CM | POA: Diagnosis not present

## 2020-10-05 DIAGNOSIS — E875 Hyperkalemia: Secondary | ICD-10-CM | POA: Diagnosis not present

## 2020-10-08 ENCOUNTER — Other Ambulatory Visit: Payer: Self-pay

## 2020-10-08 ENCOUNTER — Emergency Department: Payer: Medicare Other

## 2020-10-08 ENCOUNTER — Emergency Department
Admission: EM | Admit: 2020-10-08 | Discharge: 2020-10-08 | Disposition: A | Discharge status: Home / Self Care | Payer: Medicare Other | Attending: Emergency Medicine | Admitting: Emergency Medicine

## 2020-10-08 DIAGNOSIS — R202 Paresthesia of skin: Secondary | ICD-10-CM | POA: Diagnosis not present

## 2020-10-08 DIAGNOSIS — E1122 Type 2 diabetes mellitus with diabetic chronic kidney disease: Secondary | ICD-10-CM | POA: Insufficient documentation

## 2020-10-08 DIAGNOSIS — Z7984 Long term (current) use of oral hypoglycemic drugs: Secondary | ICD-10-CM | POA: Insufficient documentation

## 2020-10-08 DIAGNOSIS — I10 Essential (primary) hypertension: Secondary | ICD-10-CM | POA: Diagnosis not present

## 2020-10-08 DIAGNOSIS — N183 Chronic kidney disease, stage 3 unspecified: Secondary | ICD-10-CM | POA: Diagnosis not present

## 2020-10-08 DIAGNOSIS — I130 Hypertensive heart and chronic kidney disease with heart failure and stage 1 through stage 4 chronic kidney disease, or unspecified chronic kidney disease: Secondary | ICD-10-CM | POA: Insufficient documentation

## 2020-10-08 DIAGNOSIS — Z955 Presence of coronary angioplasty implant and graft: Secondary | ICD-10-CM | POA: Diagnosis not present

## 2020-10-08 DIAGNOSIS — I2510 Atherosclerotic heart disease of native coronary artery without angina pectoris: Secondary | ICD-10-CM | POA: Insufficient documentation

## 2020-10-08 DIAGNOSIS — E1140 Type 2 diabetes mellitus with diabetic neuropathy, unspecified: Secondary | ICD-10-CM | POA: Insufficient documentation

## 2020-10-08 DIAGNOSIS — K3184 Gastroparesis: Secondary | ICD-10-CM | POA: Diagnosis not present

## 2020-10-08 DIAGNOSIS — Z7982 Long term (current) use of aspirin: Secondary | ICD-10-CM | POA: Diagnosis not present

## 2020-10-08 DIAGNOSIS — I5032 Chronic diastolic (congestive) heart failure: Secondary | ICD-10-CM | POA: Diagnosis not present

## 2020-10-08 DIAGNOSIS — Z794 Long term (current) use of insulin: Secondary | ICD-10-CM | POA: Insufficient documentation

## 2020-10-08 DIAGNOSIS — Z7902 Long term (current) use of antithrombotics/antiplatelets: Secondary | ICD-10-CM | POA: Diagnosis not present

## 2020-10-08 DIAGNOSIS — Z87891 Personal history of nicotine dependence: Secondary | ICD-10-CM | POA: Diagnosis not present

## 2020-10-08 DIAGNOSIS — E1143 Type 2 diabetes mellitus with diabetic autonomic (poly)neuropathy: Secondary | ICD-10-CM | POA: Insufficient documentation

## 2020-10-08 DIAGNOSIS — I110 Hypertensive heart disease with heart failure: Secondary | ICD-10-CM | POA: Diagnosis present

## 2020-10-08 DIAGNOSIS — Z79899 Other long term (current) drug therapy: Secondary | ICD-10-CM | POA: Insufficient documentation

## 2020-10-08 LAB — CBC
HCT: 32 % — ABNORMAL LOW (ref 36.0–46.0)
Hemoglobin: 10.2 g/dL — ABNORMAL LOW (ref 12.0–15.0)
MCH: 26.9 pg (ref 26.0–34.0)
MCHC: 31.9 g/dL (ref 30.0–36.0)
MCV: 84.4 fL (ref 80.0–100.0)
Platelets: 390 10*3/uL (ref 150–400)
RBC: 3.79 MIL/uL — ABNORMAL LOW (ref 3.87–5.11)
RDW: 14 % (ref 11.5–15.5)
WBC: 9 10*3/uL (ref 4.0–10.5)
nRBC: 0 % (ref 0.0–0.2)

## 2020-10-08 LAB — BASIC METABOLIC PANEL
Anion gap: 9 (ref 5–15)
BUN: 22 mg/dL — ABNORMAL HIGH (ref 6–20)
CO2: 21 mmol/L — ABNORMAL LOW (ref 22–32)
Calcium: 8.5 mg/dL — ABNORMAL LOW (ref 8.9–10.3)
Chloride: 108 mmol/L (ref 98–111)
Creatinine, Ser: 1.54 mg/dL — ABNORMAL HIGH (ref 0.44–1.00)
GFR, Estimated: 44 mL/min — ABNORMAL LOW (ref >60)
Glucose, Bld: 193 mg/dL — ABNORMAL HIGH (ref 70–99)
Potassium: 5.4 mmol/L — ABNORMAL HIGH (ref 3.5–5.1)
Sodium: 138 mmol/L (ref 135–145)

## 2020-10-08 LAB — TROPONIN I (HIGH SENSITIVITY): Troponin I (High Sensitivity): 5 ng/L (ref <18)

## 2020-10-08 NOTE — ED Triage Notes (Signed)
Pt arrived via POV with c/o sinus pain, pt reports she has been taking Mucinex DM for the sxs. Pt states she noticed tingling in the L arm and prompted her to check blood pressure at home. Pt states readings were 194/107.  Pt reports she has hx of 3 stents in heart and hx of HF.  Pt reports she has not taken the second dose of Metoprolol today.

## 2020-10-08 NOTE — ED Provider Notes (Signed)
[name] Provider Note   ____________________________________________   First MD Initiated Contact with Patient 10/08/20 [phone]     (approximate)  I have reviewed the triage vital signs and the nursing notes.   HISTORY  Chief Complaint Hypertension    HPI [name] [name] is a 39 y.o. female stated past medical history CAD 16 hypertension, and CHF hypertension in the setting of Mucinex use as well as medication nonadherence.  Patient states that she has had congestion for the past 3 days and has been using Mucinex DM.  Patient states that approximately 3 hours prior to arrival she began having left upper extremity paresthesias and when she took her blood pressure found her systolics to be in the 614E.  Patient states that this time that she called to the nurses helpline who advised her not to take her nighttime dose of metoprolol.  Patient denies any continued chest pain, shortness of breath, syncope, weakness/numbness/paresthesias in any extremity, vision changes, or tinnitus         Past Medical History:  Diagnosis Date  . CAD (coronary artery disease)    a. 03/2015 NSTEMI/PCI: LCX 161m (DES), OM1 100p (DES - vessel labeled Ramus in subsequent cath report). Minor irregs to LAD/RCA; b. 04/2015 Cath: LM nl, LAD 40p, RI patent stent, LCX patent stent, RCA nl, EF 55-65%; c. 02/2016 MV: basal inferolateral and mid inferolateral defect/infarct w/o ischemia. EF 55-65%; d. 11/2018 PCI: LM 30ost, LAD 30p, LCX 95p/m (PTCA->20%), OM1 10ost, OM2 40ost, RCA 85p.  Marland Kitchen Chronic abdominal pain   . Chronic diastolic (congestive) heart failure ([place])    a. 03/2015 Echo: EF 30-35%, mild concentric LVH, severe HK of inf, inflat, & lat walls, mod MR, mild TR;  b. 04/2015 LV Gram: EF 55-65%; c. 09/2017 Echo: EF 55-60%, no rwma. Mild MR; d. 11/2018 Echo: Ef 60-65%, no rwma.  . CKD (chronic kidney disease), stage III ([place])   . Diabetes type 2, controlled ([place])    a.  since 2002   . Diabetic gastroparesis (HCC)    a. chronic nausea/vomiting.  . Diverticulosis, sigmoid 07/2016  . Heavy menses    a. H/O IUD - expired in 2014 - remains in place.  Marland Kitchen Hx of migraines   . Hyperlipidemia    Intolerant of atorvastatin, rosuvastatin  . Hypertension   . Iron deficiency anemia   . Ischemic cardiomyopathy (resolved)    a. 03/2015 EF 30-35% post NSTEMI;  b. 04/2015 EF 55-65% on LV gram; c. 09/2017 Echo: EF 55-60%; d. 11/2018 Echo: Ef 60-65%.  . Obesity   . Tobacco abuse    a. quit 03/2015.  Marland Kitchen Uterine fibroid   . Vertigo   . Vitamin D deficiency     Patient Active Problem List   Diagnosis Date Noted  . Atherosclerosis of aorta ([place]) 08/04/2019  . Fatty liver 08/04/2019  . AKI (acute kidney injury) ([place]) 07/22/2019  . Anemia 01/14/2019  . Stable angina ([place]) 01/12/2019  . Thrombocytosis 01/05/2019  . Coronary artery disease of native artery of native heart with stable angina pectoris ([place]) 12/17/2018  . Unstable angina ([place]) 12/04/2018  . Diabetic gastroparesis associated with type 2 diabetes mellitus ([place]) 05/02/2018  . CKD stage 3 due to type 2 diabetes mellitus ([place]) 04/06/2018  . Poorly controlled type 2 diabetes mellitus ([place]) 08/27/2017  . Lung nodule < 6cm on CT 08/12/2017  . Depression, major, single episode, severe ([place]) 08/12/2017  . Chronic abdominal pain 08/03/2017  . Obstructive sleep  apnea 05/16/2017  . Neuropathy 01/17/2017  . Vitamin D deficiency 10/25/2016  . Insomnia 10/25/2016  . 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 Elevated liver function tests 06/05/2016  . Chronic diastolic heart failure ([place]) 03/19/2016  . Proteinuria 08/30/2015  . CAD in native artery   . Angina pectoris ([place])   . Iron deficiency anemia   . Hyperlipidemia associated with type 2 diabetes mellitus ([employer]) 04/13/2015  . Ischemic cardiomyopathy   . Obesity     Past Surgical History:  Procedure Laterality Date    . APPENDECTOMY    . CARDIAC CATHETERIZATION  4/16   x2 stent [facility]  . CARDIAC CATHETERIZATION N/A 05/05/2015   Procedure: Left Heart Cath and Coronary Angiography;  Surgeon: Peter M Martinique, MD;  Location: [facility];  Service: Cardiovascular;  Laterality: N/A;  . COLONOSCOPY WITH PROPOFOL N/A 07/31/2016   Procedure: COLONOSCOPY WITH PROPOFOL;  Surgeon: Lollie Sails, MD;  Location: [name];  Service: Endoscopy;  Laterality: N/A;  . CORONARY BALLOON ANGIOPLASTY N/A 12/04/2018   Procedure: CORONARY BALLOON ANGIOPLASTY;  Surgeon: Wellington Hampshire, MD;  Location: [facility];  Service: Cardiovascular;  Laterality: N/A;  . CORONARY STENT INTERVENTION N/A 01/12/2019   Procedure: CORONARY STENT INTERVENTION;  Surgeon: [name], MD;  Location: [facility];  Service: Cardiovascular;  Laterality: N/A;  . ESOPHAGOGASTRODUODENOSCOPY (EGD) WITH PROPOFOL N/A 07/31/2016   Procedure: ESOPHAGOGASTRODUODENOSCOPY (EGD) WITH PROPOFOL;  Surgeon: Lollie Sails, MD;  Location: [facility] ENDOSCOPY;  Service: Endoscopy;  Laterality: N/A;  . INTRAVASCULAR PRESSURE WIRE/FFR STUDY N/A 12/04/2018   Procedure: INTRAVASCULAR PRESSURE WIRE/FFR STUDY;  Surgeon: Wellington Hampshire, MD;  Location: [facility];  Service: Cardiovascular;  Laterality: N/A;  . LAPAROSCOPIC APPENDECTOMY N/A 08/07/2016   Procedure: APPENDECTOMY LAPAROSCOPIC;  Surgeon: Hubbard Robinson, MD;  Location: [facility] ORS;  Service: General;  Laterality: N/A;  . LEFT HEART CATH AND CORONARY ANGIOGRAPHY Left 12/04/2018   Procedure: LEFT HEART CATH AND CORONARY ANGIOGRAPHY;  Surgeon: Wellington Hampshire, MD;  Location: [facility];  Service: Cardiovascular;  Laterality: Left;  . LEFT HEART CATH AND CORONARY ANGIOGRAPHY N/A 01/12/2019   Procedure: LEFT HEART CATH AND CORONARY ANGIOGRAPHY;  Surgeon: [name], MD;  Location: [facility];  Service: Cardiovascular;  Laterality: N/A;  . MOUTH SURGERY       Prior to Admission medications   Medication Sig Start Date End Date Taking? Authorizing Provider  ACCU-CHEK AVIVA PLUS test strip USE 1 STRIP TO CHECK GLUCOSE THREE TIMES DAILY 07/19/19   [provider]  amitriptyline (ELAVIL) 25 MG tablet Take 25 mg by mouth at bedtime. 04/18/19   [provider]  aspirin EC 81 MG tablet Take 81 mg by mouth daily.    [provider]  benzonatate (TESSALON) 100 MG capsule Take 1 capsule (100 mg total) by mouth 2 (two) times daily as needed for cough. 09/28/20   Marnee Guarneri T, NP  Cholecalciferol 2000 units TABS Take by mouth daily.     [provider]  clopidogrel (PLAVIX) 75 MG tablet Take 1 tablet (75 mg total) by mouth daily. 03/27/20   Loel Dubonnet, NP  dapagliflozin propanediol (FARXIGA) 10 MG TABS tablet Take 10 mg by mouth daily.    [provider]  DULoxetine (CYMBALTA) 30 MG capsule Take 60 mg by mouth daily.  07/01/18   [provider]  etonogestrel (NEXPLANON) 68 MG IMPL  implant 1 each by Subdermal route once.    [provider]  furosemide (LASIX) 40 MG tablet Take 1 tablet (40 mg total) by mouth every other day. 01/19/19 03/27/20  End, Harrell Gave, MD  glimepiride (AMARYL) 4 MG tablet Take 4 mg by mouth daily with breakfast.    [provider]  insulin aspart (NOVOLOG) 100 UNIT/ML injection Inject 10 Units into the skin 3 (three) times daily with meals. 05/04/18   Loletha Grayer, MD  Insulin Glargine (BASAGLAR KWIKPEN) 100 UNIT/ML SOPN Inject 0.3 mLs (30 Units total) into the skin daily. 07/26/19   Loletha Grayer, MD  irbesartan (AVAPRO) 75 MG tablet Take 1 tablet (75 mg total) by mouth daily. 03/27/20   Loel Dubonnet, NP  isosorbide mononitrate (IMDUR) 60 MG 24 hr tablet Take 1 tablet (60 mg total) by mouth daily. May take an additional half tablet as needed for elevated systolic blood pressure >810. 03/27/20   Loel Dubonnet, NP  lactulose (CHRONULAC) 10 GM/15ML  solution Take 45 mLs (30 g total) by mouth daily as needed for severe constipation. 07/28/19   Loletha Grayer, MD  meclizine (ANTIVERT) 25 MG tablet Take 25 mg by mouth 3 (three) times daily as needed for dizziness.    [provider]  metoCLOPramide (REGLAN) 10 MG tablet Take 1 tablet (10 mg total) by mouth 4 (four) times daily -  before meals and at bedtime. 07/25/19 03/27/20  Loletha Grayer, MD  metoprolol tartrate (LOPRESSOR) 100 MG tablet Take 1 tablet (100 mg total) by mouth 2 (two) times daily. 03/27/20   Loel Dubonnet, NP  mirtazapine (REMERON) 30 MG tablet Take 2 tablets (60 mg total) by mouth at bedtime. 07/25/19   Loletha Grayer, MD  nitroGLYCERIN (NITROSTAT) 0.4 MG SL tablet Take 1 tab under your tongue while sitting for chest pain, If no relief may repeat, one tab every 5 min up to 3 tabs total over 15 mins. 06/09/18   Theora Gianotti, NP  pantoprazole (PROTONIX) 40 MG tablet Take 1 tablet by mouth twice daily 06/07/20   Cannady, Jolene T, NP  prochlorperazine (COMPAZINE) 5 MG tablet TAKE 1 TABLET BY MOUTH EVERY 6 HOURS AS NEEDED FOR NAUSEA AND VOMITING 08/04/19   [provider]  promethazine (PHENERGAN) 12.5 MG tablet TAKE 1 TABLET BY MOUTH EVERY 6 HOURS AS NEEDED FOR NAUSEA AND VOMITING 02/18/20   Cannady, Henrine Screws T, NP  promethazine (PHENERGAN) 25 MG suppository Place 1 suppository (25 mg total) rectally every 6 (six) hours as needed for nausea or vomiting. 07/30/19   Johnson, Megan P, DO  senna-docusate (SENOKOT-S) 8.6-50 MG tablet Take 1 tablet by mouth 2 (two) times daily. 07/29/19   Loletha Grayer, MD    Allergies Lisinopril, Other, Rosemary oil, Shellfish allergy, and Metformin and related  Family History  Problem Relation Age of Onset  . Diabetes Father   . Cancer Maternal Grandmother        lung  . Cancer Maternal Grandfather        prostate  . Diabetes Paternal Grandfather   . Diabetes Paternal Grandmother   . Cancer - Cervical Maternal  Aunt     Social History Social History   Tobacco Use  . Smoking status: Former Smoker    Years: 15.00    Quit date: 03/10/2015    Years since quitting: 5.5  . Smokeless tobacco: Never Used  Vaping Use  . Vaping Use: Never used  Substance Use Topics  . Alcohol use: No  Alcohol/week: 0.0 standard drinks  . Drug use: Yes    Frequency: 2.0 times per week    Types: Marijuana    Review of Systems Constitutional: No fever/chills Eyes: No visual changes. ENT: No sore throat. Cardiovascular: Denies chest pain. Respiratory: Denies shortness of breath. Gastrointestinal: No abdominal pain.  No nausea, no vomiting.  No diarrhea. Genitourinary: Negative for dysuria. Musculoskeletal: Negative for acute arthralgias Skin: Negative for rash. Neurological: Negative for headaches Psychiatric: Negative for suicidal ideation/homicidal ideation   ____________________________________________   PHYSICAL EXAM:  VITAL SIGNS: ED Triage Vitals  Enc Vitals Group     BP 10/08/20 0103 (!) 185/83     Pulse Rate 10/08/20 0103 71     Resp 10/08/20 0103 18     Temp 10/08/20 0103 99.1 F (37.3 C)     Temp Source 10/08/20 0103 Oral     SpO2 10/08/20 0103 99 %     Weight 10/08/20 0103 239 lb 1.6 oz (108.5 kg)     Height 10/08/20 0103 5\' 5"  (1.651 m)     Head Circumference --      Peak Flow --      Pain Score 10/08/20 0341 0     Pain Loc --      Pain Edu? --      Excl. in [place]? --    Constitutional: Alert and oriented. Well appearing and in no acute distress. Eyes: Conjunctivae are normal. PERRL. Head: Atraumatic. Nose: No congestion/rhinnorhea. Mouth/Throat: Mucous membranes are moist. Neck: No stridor Cardiovascular: Grossly normal heart sounds.  Good peripheral circulation. Respiratory: Normal respiratory effort.  No retractions. Gastrointestinal: Soft and nontender. No distention. Musculoskeletal: No obvious deformities Neurologic:  Normal speech and language. No gross focal  neurologic deficits are appreciated. Skin:  Skin is warm and dry. No rash noted. Psychiatric: Mood and affect are normal. Speech and behavior are normal.  ____________________________________________   LABS (all labs ordered are listed, but only abnormal results are displayed)  Labs Reviewed  BASIC METABOLIC PANEL - Abnormal; Notable for the following components:      Result Value   Potassium 5.4 (*)    CO2 21 (*)    Glucose, Bld 193 (*)    BUN 22 (*)    Creatinine, Ser 1.54 (*)    Calcium 8.5 (*)    GFR, Estimated 44 (*)    All other components within normal limits  CBC - Abnormal; Notable for the following components:   RBC 3.79 (*)    Hemoglobin 10.2 (*)    HCT 32.0 (*)    All other components within normal limits  POC URINE PREG, ED  TROPONIN I (HIGH SENSITIVITY)   ____________________________________________  EKG  ED ECG REPORT I, Naaman Plummer, the attending physician, personally viewed and interpreted this ECG.  Date: 10/08/2020 EKG Time: 0110 Rate: 65 Rhythm: normal sinus rhythm QRS Axis: normal Intervals: normal ST/T Wave abnormalities: normal Narrative Interpretation: no evidence of acute ischemia  ____________________________________________  RADIOLOGY  ED MD interpretation: 2 view x-ray of the chest shows no evidence of acute abnormalities including no pneumothorax, pneumonia, or widened mediastinum  Official radiology report(s): DG Chest 2 View  Result Date: 10/08/2020 CLINICAL DATA:  Hypertensive episode with left arm tingling. EXAM: CHEST - 2 VIEW COMPARISON:  Apr 29, 2018 FINDINGS: The heart size and mediastinal contours are within normal limits. Both lungs are clear. The visualized skeletal structures are unremarkable. IMPRESSION: No active cardiopulmonary disease. Electronically Signed   By: Joyce Gross.D.  On: 10/08/2020 01:40    ____________________________________________   PROCEDURES  Procedure(s) performed (including  Critical Care):  .1-3 Lead EKG Interpretation Performed by: Naaman Plummer, MD Authorized by: Naaman Plummer, MD     Interpretation: normal     ECG rate:  85   ECG rate assessment: normal     Rhythm: sinus rhythm     Ectopy: none     Conduction: normal       ____________________________________________   INITIAL IMPRESSION / ASSESSMENT AND PLAN / ED COURSE  As part of my medical decision making, I reviewed the following data within the [facility] notes reviewed and incorporated, Labs reviewed, EKG interpreted, Old chart reviewed, Radiograph reviewed and Notes from prior ED visits reviewed and incorporated        Presents to the emergency department complaining of high blood pressure. Patient is otherwise asymptomatic without confusion, chest pain, hematuria, or SOB. +Nonadherence to antihypertensive regimen DDx: CV, AMI, heart failure, renal infarction or failure or other end organ damage.  Disposition: Discussed with patient their elevated blood pressure and need for close outpatient management of their hypertension. Will provide a prescription for the patients previous antihypertensive medication and arrange for the patient to follow up in a primary care clinic      ____________________________________________   FINAL CLINICAL IMPRESSION(S) / ED DIAGNOSES  Final diagnoses:  Primary hypertension     ED Discharge Orders    None       Note:  This document was prepared using Dragon voice recognition software and may include unintentional dictation errors.   Naaman Plummer, MD 10/08/20 [phone]

## 2020-10-08 NOTE — Discharge Instructions (Addendum)
Please use Coricidin HBP for any continued congestion symptoms

## 2020-10-08 NOTE — ED Notes (Signed)
Pt states coming in with high blood pressure after taking musinex DM. Pt states history of high blood pressure.

## 2020-10-11 ENCOUNTER — Other Ambulatory Visit: Payer: Self-pay

## 2020-10-11 ENCOUNTER — Emergency Department (HOSPITAL_COMMUNITY)
Admission: EM | Admit: 2020-10-11 | Discharge: 2020-10-11 | Disposition: A | Discharge status: Home / Self Care | Payer: Medicare Other | Attending: Emergency Medicine | Admitting: Emergency Medicine

## 2020-10-11 ENCOUNTER — Encounter (HOSPITAL_COMMUNITY): Payer: Self-pay

## 2020-10-11 DIAGNOSIS — Z7982 Long term (current) use of aspirin: Secondary | ICD-10-CM | POA: Insufficient documentation

## 2020-10-11 DIAGNOSIS — I130 Hypertensive heart and chronic kidney disease with heart failure and stage 1 through stage 4 chronic kidney disease, or unspecified chronic kidney disease: Secondary | ICD-10-CM | POA: Diagnosis not present

## 2020-10-11 DIAGNOSIS — E875 Hyperkalemia: Secondary | ICD-10-CM | POA: Diagnosis not present

## 2020-10-11 DIAGNOSIS — I129 Hypertensive chronic kidney disease with stage 1 through stage 4 chronic kidney disease, or unspecified chronic kidney disease: Secondary | ICD-10-CM | POA: Diagnosis not present

## 2020-10-11 DIAGNOSIS — Z794 Long term (current) use of insulin: Secondary | ICD-10-CM | POA: Insufficient documentation

## 2020-10-11 DIAGNOSIS — R519 Headache, unspecified: Secondary | ICD-10-CM | POA: Diagnosis not present

## 2020-10-11 DIAGNOSIS — Z955 Presence of coronary angioplasty implant and graft: Secondary | ICD-10-CM | POA: Diagnosis not present

## 2020-10-11 DIAGNOSIS — Z20822 Contact with and (suspected) exposure to covid-19: Secondary | ICD-10-CM | POA: Diagnosis not present

## 2020-10-11 DIAGNOSIS — Z7901 Long term (current) use of anticoagulants: Secondary | ICD-10-CM | POA: Insufficient documentation

## 2020-10-11 DIAGNOSIS — Z79899 Other long term (current) drug therapy: Secondary | ICD-10-CM | POA: Insufficient documentation

## 2020-10-11 DIAGNOSIS — N189 Chronic kidney disease, unspecified: Secondary | ICD-10-CM | POA: Diagnosis not present

## 2020-10-11 DIAGNOSIS — N183 Chronic kidney disease, stage 3 unspecified: Secondary | ICD-10-CM | POA: Diagnosis not present

## 2020-10-11 DIAGNOSIS — J329 Chronic sinusitis, unspecified: Secondary | ICD-10-CM | POA: Insufficient documentation

## 2020-10-11 DIAGNOSIS — E1122 Type 2 diabetes mellitus with diabetic chronic kidney disease: Secondary | ICD-10-CM | POA: Insufficient documentation

## 2020-10-11 DIAGNOSIS — Z87891 Personal history of nicotine dependence: Secondary | ICD-10-CM | POA: Insufficient documentation

## 2020-10-11 DIAGNOSIS — R0981 Nasal congestion: Secondary | ICD-10-CM | POA: Diagnosis present

## 2020-10-11 DIAGNOSIS — I5032 Chronic diastolic (congestive) heart failure: Secondary | ICD-10-CM | POA: Insufficient documentation

## 2020-10-11 DIAGNOSIS — I10 Essential (primary) hypertension: Secondary | ICD-10-CM

## 2020-10-11 DIAGNOSIS — I25118 Atherosclerotic heart disease of native coronary artery with other forms of angina pectoris: Secondary | ICD-10-CM | POA: Diagnosis not present

## 2020-10-11 LAB — CBC WITH DIFFERENTIAL/PLATELET
Abs Immature Granulocytes: 0.01 10*3/uL (ref 0.00–0.07)
Basophils Absolute: 0.1 10*3/uL (ref 0.0–0.1)
Basophils Relative: 1 %
Eosinophils Absolute: 0.6 10*3/uL — ABNORMAL HIGH (ref 0.0–0.5)
Eosinophils Relative: 8 %
HCT: 34 % — ABNORMAL LOW (ref 36.0–46.0)
Hemoglobin: 10.5 g/dL — ABNORMAL LOW (ref 12.0–15.0)
Immature Granulocytes: 0 %
Lymphocytes Relative: 30 %
Lymphs Abs: 2.3 10*3/uL (ref 0.7–4.0)
MCH: 26.9 pg (ref 26.0–34.0)
MCHC: 30.9 g/dL (ref 30.0–36.0)
MCV: 87 fL (ref 80.0–100.0)
Monocytes Absolute: 0.4 10*3/uL (ref 0.1–1.0)
Monocytes Relative: 6 %
Neutro Abs: 4.3 10*3/uL (ref 1.7–7.7)
Neutrophils Relative %: 55 %
Platelets: 438 10*3/uL — ABNORMAL HIGH (ref 150–400)
RBC: 3.91 MIL/uL (ref 3.87–5.11)
RDW: 14.4 % (ref 11.5–15.5)
WBC: 7.6 10*3/uL (ref 4.0–10.5)
nRBC: 0 % (ref 0.0–0.2)

## 2020-10-11 LAB — URINALYSIS, ROUTINE W REFLEX MICROSCOPIC
Bilirubin Urine: NEGATIVE
Glucose, UA: 50 mg/dL — AB
Hgb urine dipstick: NEGATIVE
Ketones, ur: NEGATIVE mg/dL
Leukocytes,Ua: NEGATIVE
Nitrite: NEGATIVE
Protein, ur: >=300 mg/dL — AB
Specific Gravity, Urine: 1.017 (ref 1.005–1.030)
pH: 5 (ref 5.0–8.0)

## 2020-10-11 LAB — RESPIRATORY PANEL BY RT PCR (FLU A&B, COVID)
Influenza A by PCR: NEGATIVE
Influenza B by PCR: NEGATIVE
SARS Coronavirus 2 by RT PCR: NEGATIVE

## 2020-10-11 LAB — BASIC METABOLIC PANEL
Anion gap: 8 (ref 5–15)
BUN: 23 mg/dL — ABNORMAL HIGH (ref 6–20)
CO2: 20 mmol/L — ABNORMAL LOW (ref 22–32)
Calcium: 8.6 mg/dL — ABNORMAL LOW (ref 8.9–10.3)
Chloride: 109 mmol/L (ref 98–111)
Creatinine, Ser: 1.61 mg/dL — ABNORMAL HIGH (ref 0.44–1.00)
GFR, Estimated: 41 mL/min — ABNORMAL LOW (ref >60)
Glucose, Bld: 178 mg/dL — ABNORMAL HIGH (ref 70–99)
Potassium: 5.2 mmol/L — ABNORMAL HIGH (ref 3.5–5.1)
Sodium: 137 mmol/L (ref 135–145)

## 2020-10-11 LAB — I-STAT BETA HCG BLOOD, ED (MC, WL, AP ONLY): I-stat hCG, quantitative: <5 m[IU]/mL (ref <5)

## 2020-10-11 MED ORDER — FLUTICASONE PROPIONATE 50 MCG/ACT NA SUSP: 2.0000 | Freq: Every day | NASAL | 2 refills | Status: DC

## 2020-10-11 MED ORDER — AMOXICILLIN-POT CLAVULANATE 875-125 MG PO TABS: 1.0000 | ORAL_TABLET | Freq: Two times a day (BID) | ORAL | 0 refills | Status: DC

## 2020-10-11 NOTE — Discharge Instructions (Signed)
You were seen in the emergency department for a frontal headache and nasal congestion.  Your Covid test was negative.  Your labs show that your potassium is still mildly elevated and you will need to continue your Lokelma.  We are treating you with antibiotics and nasal steroids for a possible sinus infection.  Please follow-up with your regular doctor.  Return to the emergency department for any worsening or concerning symptoms

## 2020-10-11 NOTE — ED Triage Notes (Addendum)
Pt reports headache and nasal congestion.

## 2020-10-11 NOTE — ED Provider Notes (Signed)
[facility] DEPT Provider Note   CSN: [id number] Arrival date & time: 10/11/20  0017     History Chief Complaint  Patient presents with  . Headache    [name] [name] is a 39 y.o. female.  She has a complex medical history including coronary disease and CKD.  She said she had hyperkalemia Friday when she went to see her nephrologist and has been taking Lokelma.  She has been troubled with sinus problems for the last few weeks with nasal congestion frontal headache low-grade fevers.  1 episode of vomiting and diarrhea.  Minimal cough no shortness of breath.  She is not Covid vaccinated.  The history is provided by the patient.  URI Presenting symptoms: congestion, facial pain, fatigue, fever, rhinorrhea and sore throat   Congestion:    Location:  Nasal Fatigue:    Severity:  Moderate   Timing:  Intermittent   Progression:  Unchanged Rhinorrhea:    Quality:  Clear   Severity:  Moderate   Timing:  Intermittent   Progression:  Unchanged Relieved by:  Nothing Worsened by:  Nothing Ineffective treatments:  OTC medications Associated symptoms: headaches and myalgias   Risk factors: chronic cardiac disease, chronic kidney disease and diabetes mellitus        Past Medical History:  Diagnosis Date  . CAD (coronary artery disease)    a. 03/2015 NSTEMI/PCI: LCX 138m (DES), OM1 100p (DES - vessel labeled Ramus in subsequent cath report). Minor irregs to LAD/RCA; b. 04/2015 Cath: LM nl, LAD 40p, RI patent stent, LCX patent stent, RCA nl, EF 55-65%; c. 02/2016 MV: basal inferolateral and mid inferolateral defect/infarct w/o ischemia. EF 55-65%; d. 11/2018 PCI: LM 30ost, LAD 30p, LCX 95p/m (PTCA->20%), OM1 10ost, OM2 40ost, RCA 85p.  Marland Kitchen Chronic abdominal pain   . Chronic diastolic (congestive) heart failure ([place])    a. 03/2015 Echo: EF 30-35%, mild concentric LVH, severe HK of inf, inflat, & lat walls, mod MR, mild TR;  b. 04/2015 LV Gram: EF 55-65%; c. 09/2017  Echo: EF 55-60%, no rwma. Mild MR; d. 11/2018 Echo: Ef 60-65%, no rwma.  . CKD (chronic kidney disease), stage III ([place])   . Diabetes type 2, controlled ([place])    a. since 2002   . Diabetic gastroparesis (HCC)    a. chronic nausea/vomiting.  . Diverticulosis, sigmoid 07/2016  . Heavy menses    a. H/O IUD - expired in 2014 - remains in place.  Marland Kitchen Hx of migraines   . Hyperlipidemia    Intolerant of atorvastatin, rosuvastatin  . Hypertension   . Iron deficiency anemia   . Ischemic cardiomyopathy (resolved)    a. 03/2015 EF 30-35% post NSTEMI;  b. 04/2015 EF 55-65% on LV gram; c. 09/2017 Echo: EF 55-60%; d. 11/2018 Echo: Ef 60-65%.  . Obesity   . Tobacco abuse    a. quit 03/2015.  Marland Kitchen Uterine fibroid   . Vertigo   . Vitamin D deficiency     Patient Active Problem List   Diagnosis Date Noted  . Atherosclerosis of aorta ([place]) 08/04/2019  . Fatty liver 08/04/2019  . AKI (acute kidney injury) ([place]) 07/22/2019  . Anemia 01/14/2019  . Stable angina ([place]) 01/12/2019  . Thrombocytosis 01/05/2019  . Coronary artery disease of native artery of native heart with stable angina pectoris ([place]) 12/17/2018  . Unstable angina ([place]) 12/04/2018  . Diabetic gastroparesis associated with type 2 diabetes mellitus ([place]) 05/02/2018  . CKD stage 3 due to type 2 diabetes mellitus (  [place]) 04/06/2018  . Poorly controlled type 2 diabetes mellitus ([place]) 08/27/2017  . Lung nodule < 6cm on CT 08/12/2017  . Depression, major, single episode, severe ([place]) 08/12/2017  . Chronic abdominal pain 08/03/2017  . Obstructive sleep apnea 05/16/2017  . Neuropathy 01/17/2017  . Vitamin D deficiency 10/25/2016  . Insomnia 10/25/2016  . 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 Elevated liver function tests 06/05/2016  . Chronic diastolic heart failure ([place]) 03/19/2016  . Proteinuria 08/30/2015  . CAD in native artery   . Angina pectoris ([place])   . Iron deficiency anemia   .  Hyperlipidemia associated with type 2 diabetes mellitus ([place]) 04/13/2015  . Ischemic cardiomyopathy   . Obesity     Past Surgical History:  Procedure Laterality Date  . APPENDECTOMY    . CARDIAC CATHETERIZATION  4/16   x2 stent [facility]  . CARDIAC CATHETERIZATION N/A 05/05/2015   Procedure: Left Heart Cath and Coronary Angiography;  Surgeon: Peter M Martinique, MD;  Location: [facility];  Service: Cardiovascular;  Laterality: N/A;  . COLONOSCOPY WITH PROPOFOL N/A 07/31/2016   Procedure: COLONOSCOPY WITH PROPOFOL;  Surgeon: Lollie Sails, MD;  Location: [facility] ENDOSCOPY;  Service: Endoscopy;  Laterality: N/A;  . CORONARY BALLOON ANGIOPLASTY N/A 12/04/2018   Procedure: CORONARY BALLOON ANGIOPLASTY;  Surgeon: Wellington Hampshire, MD;  Location: [facility];  Service: Cardiovascular;  Laterality: N/A;  . CORONARY STENT INTERVENTION N/A 01/12/2019   Procedure: CORONARY STENT INTERVENTION;  Surgeon: [name], MD;  Location: [facility];  Service: Cardiovascular;  Laterality: N/A;  . ESOPHAGOGASTRODUODENOSCOPY (EGD) WITH PROPOFOL N/A 07/31/2016   Procedure: ESOPHAGOGASTRODUODENOSCOPY (EGD) WITH PROPOFOL;  Surgeon: Lollie Sails, MD;  Location: [facility] ENDOSCOPY;  Service: Endoscopy;  Laterality: N/A;  . INTRAVASCULAR PRESSURE WIRE/FFR STUDY N/A 12/04/2018   Procedure: INTRAVASCULAR PRESSURE WIRE/FFR STUDY;  Surgeon: Wellington Hampshire, MD;  Location: [facility];  Service: Cardiovascular;  Laterality: N/A;  . LAPAROSCOPIC APPENDECTOMY N/A 08/07/2016   Procedure: APPENDECTOMY LAPAROSCOPIC;  Surgeon: Hubbard Robinson, MD;  Location: [facility] ORS;  Service: General;  Laterality: N/A;  . LEFT HEART CATH AND CORONARY ANGIOGRAPHY Left 12/04/2018   Procedure: LEFT HEART CATH AND CORONARY ANGIOGRAPHY;  Surgeon: Wellington Hampshire, MD;  Location: [facility];  Service: Cardiovascular;  Laterality: Left;  . LEFT HEART CATH AND CORONARY ANGIOGRAPHY N/A 01/12/2019    Procedure: LEFT HEART CATH AND CORONARY ANGIOGRAPHY;  Surgeon: [name], MD;  Location: [facility];  Service: Cardiovascular;  Laterality: N/A;  . MOUTH SURGERY       OB History    Gravida  3   Para      Term      Preterm      AB  2   Living  1     SAB  2   TAB      Ectopic      Multiple      Live Births              Family History  Problem Relation Age of Onset  . Diabetes Father   . Cancer Maternal Grandmother        lung  . Cancer Maternal Grandfather        prostate  . Diabetes Paternal Grandfather   . Diabetes Paternal Grandmother   . Cancer - Cervical Maternal Aunt     Social History   Tobacco  Use  . Smoking status: Former Smoker    Years: 15.00    Quit date: 03/10/2015    Years since quitting: 5.5  . Smokeless tobacco: Never Used  Vaping Use  . Vaping Use: Never used  Substance Use Topics  . Alcohol use: No    Alcohol/week: 0.0 standard drinks  . Drug use: Yes    Frequency: 2.0 times per week    Types: Marijuana    Home Medications Prior to Admission medications   Medication Sig Start Date End Date Taking? Authorizing Provider  ACCU-CHEK AVIVA PLUS test strip USE 1 STRIP TO CHECK GLUCOSE THREE TIMES DAILY 07/19/19   [provider]  amitriptyline (ELAVIL) 25 MG tablet Take 25 mg by mouth at bedtime. 04/18/19   [provider]  aspirin EC 81 MG tablet Take 81 mg by mouth daily.    [provider]  benzonatate (TESSALON) 100 MG capsule Take 1 capsule (100 mg total) by mouth 2 (two) times daily as needed for cough. 09/28/20   Marnee Guarneri T, NP  Cholecalciferol 2000 units TABS Take by mouth daily.     [provider]  clopidogrel (PLAVIX) 75 MG tablet Take 1 tablet (75 mg total) by mouth daily. 03/27/20   Loel Dubonnet, NP  dapagliflozin propanediol (FARXIGA) 10 MG TABS tablet Take 10 mg by mouth daily.    [provider]  DULoxetine (CYMBALTA) 30 MG capsule Take 60 mg by  mouth daily.  07/01/18   [provider]  etonogestrel (NEXPLANON) 68 MG IMPL implant 1 each by Subdermal route once.    [provider]  furosemide (LASIX) 40 MG tablet Take 1 tablet (40 mg total) by mouth every other day. 01/19/19 03/27/20  End, Harrell Gave, MD  glimepiride (AMARYL) 4 MG tablet Take 4 mg by mouth daily with breakfast.    [provider]  insulin aspart (NOVOLOG) 100 UNIT/ML injection Inject 10 Units into the skin 3 (three) times daily with meals. 05/04/18   Loletha Grayer, MD  Insulin Glargine (BASAGLAR KWIKPEN) 100 UNIT/ML SOPN Inject 0.3 mLs (30 Units total) into the skin daily. 07/26/19   Loletha Grayer, MD  irbesartan (AVAPRO) 75 MG tablet Take 1 tablet (75 mg total) by mouth daily. 03/27/20   Loel Dubonnet, NP  isosorbide mononitrate (IMDUR) 60 MG 24 hr tablet Take 1 tablet (60 mg total) by mouth daily. May take an additional half tablet as needed for elevated systolic blood pressure >902. 03/27/20   Loel Dubonnet, NP  lactulose (CHRONULAC) 10 GM/15ML solution Take 45 mLs (30 g total) by mouth daily as needed for severe constipation. 07/28/19   Loletha Grayer, MD  meclizine (ANTIVERT) 25 MG tablet Take 25 mg by mouth 3 (three) times daily as needed for dizziness.    [provider]  metoCLOPramide (REGLAN) 10 MG tablet Take 1 tablet (10 mg total) by mouth 4 (four) times daily -  before meals and at bedtime. 07/25/19 03/27/20  Loletha Grayer, MD  metoprolol tartrate (LOPRESSOR) 100 MG tablet Take 1 tablet (100 mg total) by mouth 2 (two) times daily. 03/27/20   Loel Dubonnet, NP  mirtazapine (REMERON) 30 MG tablet Take 2 tablets (60 mg total) by mouth at bedtime. 07/25/19   Loletha Grayer, MD  nitroGLYCERIN (NITROSTAT) 0.4 MG SL tablet Take 1 tab under your tongue while sitting for chest pain, If no relief may repeat, one tab every 5 min up to 3 tabs total over 15 mins. 06/09/18  Theora Gianotti, NP  pantoprazole (PROTONIX) 40  MG tablet Take 1 tablet by mouth twice daily 06/07/20   Cannady, Henrine Screws T, NP  prochlorperazine (COMPAZINE) 5 MG tablet TAKE 1 TABLET BY MOUTH EVERY 6 HOURS AS NEEDED FOR NAUSEA AND VOMITING 08/04/19   [provider]  promethazine (PHENERGAN) 12.5 MG tablet TAKE 1 TABLET BY MOUTH EVERY 6 HOURS AS NEEDED FOR NAUSEA AND VOMITING 02/18/20   Cannady, Henrine Screws T, NP  promethazine (PHENERGAN) 25 MG suppository Place 1 suppository (25 mg total) rectally every 6 (six) hours as needed for nausea or vomiting. 07/30/19   Johnson, Megan P, DO  senna-docusate (SENOKOT-S) 8.6-50 MG tablet Take 1 tablet by mouth 2 (two) times daily. 07/29/19   Loletha Grayer, MD    Allergies    Lisinopril, Other, Rosemary oil, Shellfish allergy, and Metformin and related  Review of Systems   Review of Systems  Constitutional: Positive for fatigue and fever.  HENT: Positive for congestion, rhinorrhea, sinus pressure and sore throat.   Eyes: Negative for visual disturbance.  Respiratory: Negative for shortness of breath.   Cardiovascular: Negative for chest pain.  Gastrointestinal: Positive for vomiting.  Genitourinary: Positive for dysuria.  Musculoskeletal: Positive for myalgias.  Skin: Negative for rash.  Neurological: Positive for headaches.    Physical Exam Updated Vital Signs BP (!) 163/89   Pulse 71   Temp 98.5 F (36.9 C) (Oral)   Resp 16   Ht 5\' 5"  (1.651 m)   Wt 108.9 kg   SpO2 100%   BMI 39.94 kg/m   Physical Exam Vitals and nursing note reviewed.  Constitutional:      General: She is not in acute distress.    Appearance: She is well-developed.  HENT:     Head: Normocephalic and atraumatic.     Right Ear: Tympanic membrane normal.     Left Ear: Tympanic membrane normal.     Nose: Congestion and rhinorrhea present.     Right Sinus: Maxillary sinus tenderness and frontal sinus tenderness present.     Mouth/Throat:     Mouth: Mucous membranes are moist.     Pharynx: Oropharynx is clear.    Eyes:     Conjunctiva/sclera: Conjunctivae normal.     Pupils: Pupils are equal, round, and reactive to light.  Cardiovascular:     Rate and Rhythm: Normal rate and regular rhythm.     Heart sounds: No murmur heard.   Pulmonary:     Effort: Pulmonary effort is normal. No respiratory distress.     Breath sounds: Normal breath sounds.  Abdominal:     Palpations: Abdomen is soft.     Tenderness: There is no abdominal tenderness.  Musculoskeletal:        General: No deformity or signs of injury.     Cervical back: Neck supple.  Skin:    General: Skin is warm and dry.  Neurological:     General: No focal deficit present.     Mental Status: She is alert.     ED Results / Procedures / Treatments   Labs (all labs ordered are listed, but only abnormal results are displayed) Labs Reviewed  BASIC METABOLIC PANEL - Abnormal; Notable for the following components:      Result Value   Potassium 5.2 (*)    CO2 20 (*)    Glucose, Bld 178 (*)    BUN 23 (*)    Creatinine, Ser 1.61 (*)    Calcium 8.6 (*)  GFR, Estimated 41 (*)    All other components within normal limits  CBC WITH DIFFERENTIAL/PLATELET - Abnormal; Notable for the following components:   Hemoglobin 10.5 (*)    HCT 34.0 (*)    Platelets 438 (*)    Eosinophils Absolute 0.6 (*)    All other components within normal limits  URINALYSIS, ROUTINE W REFLEX MICROSCOPIC - Abnormal; Notable for the following components:   APPearance HAZY (*)    Glucose, UA 50 (*)    Protein, ur >=300 (*)    Bacteria, UA RARE (*)    All other components within normal limits  RESPIRATORY PANEL BY RT PCR (FLU A&B, COVID)  I-STAT BETA HCG BLOOD, ED ([facility], [facility], [facility] ONLY)    EKG None  Radiology No results found.  Procedures Procedures (including critical care time)  Medications Ordered in ED Medications - No data to display  ED Course  I have reviewed the triage vital signs and the nursing notes.  Pertinent labs & imaging results that  were available during my care of the patient were reviewed by me and considered in my medical decision making (see chart for details).    MDM Rules/Calculators/A&P                         [name] [name] was evaluated in Emergency Department on 10/11/2020 for the symptoms described in the history of present illness. She was evaluated in the context of the global COVID-19 pandemic, which necessitated consideration that the patient might be at risk for infection with the SARS-CoV-2 virus that causes COVID-19. Institutional protocols and algorithms that pertain to the evaluation of patients at risk for COVID-19 are in a state of rapid change based on information released by regulatory bodies including the CDC and federal and state organizations. These policies and algorithms were followed during the patient's care in the ED.  This patient complains of frontal headache, nasal congestion, low-grade fevers, recent hyperkalemia; this involves an extensive number of treatment Options and is a complaint that carries with it a high risk of complications and Morbidity. The differential includes Covid, sinusitis, URI, allergies, renal failure, hyperkalemia, metabolic derangement  I ordered, reviewed and interpreted labs, which included CBC with normal white count, stable low hemoglobin, chemistries with mildly elevated potassium of 5.2, creatinine elevated at 1.6 slightly off with baseline, urinalysis with proteinuria glucosuria but no obvious signs of infection Previous records obtained and reviewed including recent ED visit at [facility] for sinus symptoms and hypertension  After the interventions stated above, I reevaluated the patient and found patient to be very well-appearing.  She is hypertensive here.  In no distress.  Will treat with some topical Flonase and antibiotics for possible sinus infection.  Covid testing negative.  Recommended close follow-up with nephrology and primary care doctor regarding  symptoms.  Return instructions discussed   Final Clinical Impression(s) / ED Diagnoses Final diagnoses:  Sinus headache  Hyperkalemia  Chronic kidney disease, unspecified CKD stage  Primary hypertension    Rx / DC Orders ED Discharge Orders         Ordered    fluticasone (FLONASE) 50 MCG/ACT nasal spray  Daily        10/11/20 1004    amoxicillin-clavulanate (AUGMENTIN) 875-125 MG tablet  Every 12 hours        10/11/20 1004           Hayden Rasmussen, MD 10/11/20 1759

## 2020-10-13 ENCOUNTER — Encounter: Payer: Self-pay | Admitting: Cardiovascular Disease

## 2020-10-13 ENCOUNTER — Other Ambulatory Visit: Payer: Self-pay

## 2020-10-13 ENCOUNTER — Ambulatory Visit (INDEPENDENT_AMBULATORY_CARE_PROVIDER_SITE_OTHER): Payer: Medicare Other | Admitting: Cardiovascular Disease

## 2020-10-13 VITALS — BP 140/76 | HR 68 | Ht 65.0 in | Wt 238.5 lb

## 2020-10-13 DIAGNOSIS — N1831 Chronic kidney disease, stage 3a: Secondary | ICD-10-CM | POA: Diagnosis not present

## 2020-10-13 DIAGNOSIS — E785 Hyperlipidemia, unspecified: Secondary | ICD-10-CM

## 2020-10-13 DIAGNOSIS — I5032 Chronic diastolic (congestive) heart failure: Secondary | ICD-10-CM | POA: Diagnosis not present

## 2020-10-13 DIAGNOSIS — I2510 Atherosclerotic heart disease of native coronary artery without angina pectoris: Secondary | ICD-10-CM

## 2020-10-13 DIAGNOSIS — I10 Essential (primary) hypertension: Secondary | ICD-10-CM

## 2020-10-13 NOTE — Patient Instructions (Addendum)
Medication Instructions:  - Your physician recommends that you continue on your current medications as directed. Please refer to the Current Medication list given to you today.   *If you need a refill on your cardiac medications before your next appointment, please call your pharmacy*   Lab Work: - none ordered  If you have labs (blood work) drawn today and your tests are completely normal, you will receive your results only by: Marland Kitchen MyChart Message (if you have MyChart) OR . A paper copy in the mail If you have any lab test that is abnormal or we need to change your treatment, we will call you to review the results.   Testing/Procedures: - none ordered   Follow-Up: At [facility], you and your health needs are our priority.  As part of our continuing mission to provide you with exceptional heart care, we have created designated Provider Care Teams.  These Care Teams include your primary Cardiologist (physician) and Advanced Practice Providers (APPs -  Physician Assistants and Nurse Practitioners) who all work together to provide you with the care you need, when you need it.  We recommend signing up for the patient portal called "MyChart".  Sign up information is provided on this After Visit Summary.  MyChart is used to connect with patients for Virtual Visits (Telemedicine).  Patients are able to view lab/test results, encounter notes, upcoming appointments, etc.  Non-urgent messages can be sent to your provider as well.   To learn more about what you can do with MyChart, go to [email or web address].    Your next appointment:   6 month(s)  The format for your next appointment:   In Person  Provider:   You may see Kathlyn Sacramento, MD or one of the following Advanced Practice Providers on your designated Care Team:    Murray Hodgkins, NP  Christell Faith, PA-C  Marrianne Mood, PA-C  Cadence Kathlen Mody, [place]    Other Instructions  Potassium Content of Foods  Potassium is a  mineral found in many foods and drinks. It affects how the heart works, and helps keep fluids and minerals balanced in the body. The amount of potassium you need each day depends on your age and any medical conditions you may have. Talk to your health care provider or dietitian about how much potassium you need. The following lists of foods provide the general serving size for foods and the approximate amount of potassium in each serving, listed in milligrams (mg). Actual values may vary depending on the product and how it is processed. High in potassium The following foods and beverages have 200 mg or more of potassium per serving:  Apricots (raw) - 2 have 200 mg of potassium.  Apricots (dry) - 5 have 200 mg of potassium.  Artichoke - 1 medium has 345 mg of potassium.  Avocado -  fruit has 245 mg of potassium.  Banana - 1 medium fruit has 425 mg of potassium.  [place] or baked beans (canned) -  cup has 280 mg of potassium.  White beans (canned) -  cup has 595 mg potassium.  Beef roast - 3 oz has 320 mg of potassium.  Ground beef - 3 oz has 270 mg of potassium.  Beets (raw or cooked) -  cup has 260 mg of potassium.  Bran muffin - 2 oz has 300 mg of potassium.  Broccoli (cooked) -  cup has 230 mg of potassium.  Brussels sprouts -  cup has 250 mg of potassium.  Cantaloupe -  cup has 215 mg of potassium.  Cereal, 100% bran -  cup has 200-400 mg of potassium.  Cheeseburger -1 single fast food burger has 225-400 mg of potassium.  Chicken - 3 oz has 220 mg of potassium.  Clams (canned) - 3 oz has 535 mg of potassium.  Crab - 3 oz has 225 mg of potassium.  Dates - 5 have 270 mg of potassium.  Dried beans and peas -  cup has 300-475 mg of potassium.  Figs (dried) - 2 have 260 mg of potassium.  Fish (halibut, tuna, cod, snapper) - 3 oz has 480 mg of potassium.  Fish (salmon, haddock, swordfish, perch) - 3 oz has 300 mg of potassium.  Fish (tuna, canned) - 3 oz has  200 mg of potassium.  [place] fries (fast food) - 3 oz has 470 mg of potassium.  Granola with fruit and nuts -  cup has 200 mg of potassium.  Grapefruit juice -  cup has 200 mg of potassium.  Honeydew melon -  cup has 200 mg of potassium.  Kale (raw) - 1 cup has 300 mg of potassium.  Kiwi - 1 medium fruit has 240 mg of potassium.  Kohlrabi, rutabaga, parsnips -  cup has 280 mg of potassium.  Lentils -  cup has 365 mg of potassium.  Mango - 1 each has 325 mg of potassium.  Milk (nonfat, low-fat, whole, buttermilk) - 1 cup has 350-380 mg of potassium.  Milk (chocolate) - 1 cup has 420 mg of potassium  Molasses - 1 Tbsp has 295 mg of potassium.  Mushrooms -  cup has 280 mg of potassium.  Nectarine - 1 each has 275 mg of potassium.  Nuts (almonds, peanuts, hazelnuts, [place], cashew, mixed) - 1 oz has 200 mg of potassium.  Nuts (pistachios) - 1 oz has 295 mg of potassium.  Orange - 1 fruit has 240 mg of potassium.  Orange juice -  cup has 235 mg of potassium.  Papaya -  medium fruit has 390 mg of potassium.  Peanut butter (chunky) - 2 Tbsp has 240 mg of potassium.  Peanut butter (smooth) - 2 Tbsp has 210 mg of potassium.  Pear - 1 medium (200 mg) of potassium.  Pomegranate - 1 whole fruit has 400 mg of potassium.  Pomegranate juice -  cup has 215 mg of potassium.  Pork - 3 oz has 350 mg of potassium.  Potato chips (salted) - 1 oz has 465 mg of potassium.  Potato (baked with skin) - 1 medium has 925 mg of potassium.  Potato (boiled) -  cup has 255 mg of potassium.  Potato (Mashed) -  cup has 330 mg of potassium.  Prune juice -  cup has 370 mg of potassium.  Prunes - 5 have 305 mg of potassium.  Pudding (chocolate) -  cup has 230 mg of potassium.  Pumpkin (canned) -  cup has 250 mg of potassium.  Raisins (seedless) -  cup has 270 mg of potassium.  Seeds (sunflower or pumpkin) - 1 oz has 240 mg of potassium.  Soy milk - 1 cup has 300 mg of  potassium.  Spinach (cooked) - 1/2 cup has 420 mg of potassium.  Spinach (canned) -  cup has 370 mg of potassium.  Sweet potato (baked with skin) - 1 medium has 450 mg of potassium.  Swiss chard -  cup has 480 mg of potassium.  Tomato or vegetable juice -  cup has 275 mg of  potassium.  Tomato (sauce or puree) -  cup has 400-550 mg of potassium.  Tomato (raw) - 1 medium has 290 mg of potassium.  Tomato (canned) -  cup has 200-300 mg of potassium.  [place] - 3 oz has 250 mg of potassium.  Wheat germ - 1 oz has 250 mg of potassium.  Winter squash -  cup has 250 mg of potassium.  Yogurt (plain or fruited) - 6 oz has 260-435 mg of potassium.  Zucchini -  cup has 220 mg of potassium. Moderate in potassium The following foods and beverages have 50-200 mg of potassium per serving:  Apple - 1 fruit has 150 mg of potassium  Apple juice -  cup has 150 mg of potassium  Applesauce -  cup has 90 mg of potassium  Apricot nectar -  cup has 140 mg of potassium  Asparagus (small spears) -  cup has 155 mg of potassium  Asparagus (large spears) - 6 have 155 mg of potassium  Bagel (cinnamon raisin) - 1 four-inch bagel has 130 mg of potassium  Bagel (egg or plain) - 1 four- inch bagel has 70 mg of potassium  Beans (green) -  cup has 90 mg of potassium  Beans (yellow) -  cup has 190 mg of potassium  Beer, regular - 12 oz has 100 mg of potassium  Beets (canned) -  cup has 125 mg of potassium  Blackberries -  cup has 115 mg of potassium  Blueberries -  cup has 60 mg of potassium  Bread (whole wheat) - 1 slice has 70 mg of potassium  Broccoli (raw) -  cup has 145 mg of potassium  Cabbage -  cup has 150 mg of potassium  Carrots (cooked or raw) -  cup has 180 mg of potassium  Cauliflower (raw) -  cup has 150 mg of potassium  Celery (raw) -  cup has 155 mg of potassium  Cereal, bran flakes -  cup has 120-150 mg of potassium  Cheese (cottage) -  cup has  110 mg of potassium  Cherries - 10 have 150 mg of potassium  Chocolate - 1 oz bar has 165 mg of potassium  Coffee (brewed) - 6 oz has 90 mg of potassium  Corn -  cup or 1 ear has 195 mg of potassium  Cucumbers -  cup has 80 mg of potassium  Egg - 1 large egg has 60 mg of potassium  Eggplant -  cup has 60 mg of potassium  Endive (raw) -  cup has 80 mg of potassium  English muffin - 1 has 65 mg of potassium  Fish (ocean perch) - 3 oz has 192 mg of potassium  Frankfurter, beef or pork - 1 has 75 mg of potassium  Fruit cocktail -  cup has 115 mg of potassium  Grape juice -  cup has 170 mg of potassium  Grapefruit -  fruit has 175 mg of potassium  Grapes -  cup has 155 mg of potassium  Greens: kale, turnip, collard -  cup has 110-150 mg of potassium  Ice cream or frozen yogurt (chocolate) -  cup has 175 mg of potassium  Ice cream or frozen yogurt (vanilla) -  cup has 120-150 mg of potassium  Lemons, limes - 1 each has 80 mg of potassium  Lettuce - 1 cup has 100 mg of potassium  Mixed vegetables -  cup has 150 mg of potassium  Mushrooms, raw -  cup has 110  mg of potassium  Nuts (walnuts, pecans, or macadamia) - 1 oz has 125 mg of potassium  Oatmeal -  cup has 80 mg of potassium  Okra -  cup has 110 mg of potassium  Onions -  cup has 120 mg of potassium  Peach - 1 has 185 mg of potassium  Peaches (canned) -  cup has 120 mg of potassium  Pears (canned) -  cup has 120 mg of potassium  Peas, green (frozen) -  cup has 90 mg of potassium  Peppers (Green) -  cup has 130 mg of potassium  Peppers (Red) -  cup has 160 mg of potassium  Pineapple juice -  cup has 165 mg of potassium  Pineapple (fresh or canned) -  cup has 100 mg of potassium  Plums - 1 has 105 mg of potassium  Pudding, vanilla -  cup has 150 mg of potassium  Raspberries -  cup has 90 mg of potassium  Rhubarb -  cup has 115 mg of potassium  Rice, wild -  cup has  80 mg of potassium  Shrimp - 3 oz has 155 mg of potassium  Spinach (raw) - 1 cup has 170 mg of potassium  Strawberries -  cup has 125 mg of potassium  Summer squash -  cup has 175-200 mg of potassium  Swiss chard (raw) - 1 cup has 135 mg of potassium  Tangerines - 1 fruit has 140 mg of potassium  Tea, brewed - 6 oz has 65 mg of potassium  Turnips -  cup has 140 mg of potassium  Watermelon -  cup has 85 mg of potassium  Wine (Red, table) - 5 oz has 180 mg of potassium  Wine (White, table) - 5 oz 100 mg of potassium Low in potassium The following foods and beverages have less than 50 mg of potassium per serving.  Bread (white) - 1 slice has 30 mg of potassium  Carbonated beverages - 12 oz has less than 5 mg of potassium  Cheese - 1 oz has 20-30 mg of potassium  Cranberries -  cup has 45 mg of potassium  Cranberry juice cocktail -  cup has 20 mg of potassium  Fats and oils - 1 Tbsp has less than 5 mg of potassium  Hummus - 1 Tbsp has 32 mg of potassium  Nectar (papaya, mango, or pear) -  cup has 35 mg of potassium  Rice (white or brown) -  cup has 50 mg of potassium  Spaghetti or macaroni (cooked) -  cup has 30 mg of potassium  T[place]illa, flour or corn - 1 has 50 mg of potassium  Waffle - 1 four-inch waffle has 50 mg of potassium  Water chestnuts -  cup has 40 mg of potassium Summary  Potassium is a mineral found in many foods and drinks. It affects how the heart works, and helps keep fluids and minerals balanced in the body.  The amount of potassium you need each day depends on your age and any existing medical conditions you may have. Your health care provider or dietitian may recommend an amount of potassium that you should have each day. This information is not intended to replace advice given to you by your health care provider. Make sure you discuss any questions you have with your health care provider. Document Revised: 11/07/2017 Document  Reviewed: 02/19/2017 Elsevier Patient Education  [place] for Chronic Kidney Disease When your kidneys are  not working well, they cannot remove waste and excess substances from your blood as effectively as they did before. This can lead to a buildup and imbalance of these substances, which can worsen kidney damage and affect how your body functions. Certain foods lead to a buildup of these substances in the body. By changing your diet as recommended by your diet and nutrition specialist (dietitian) or health care provider, you could help prevent further kidney damage and delay or prevent the need for dialysis. What are tips for following this plan? General instructions   Work with your health care provider and dietitian to develop a meal plan that is right for you. Foods you can eat, limit, or avoid will be different for each person depending on the stage of kidney disease and any other existing health conditions.  Talk with your health care provider about whether you should take a vitamin and mineral supplement.  Use standard measuring cups and spoons to measure servings of foods. Use a kitchen scale to measure portions of protein foods.  If directed by your health care provider, avoid drinking too much fluid. Measure and count all liquids, including water, ice, soups, flavored gelatin, and frozen desserts such as popsicles or ice cream. Reading food labels  Check the amount of sodium in foods. Choose foods that have less than 300 milligrams (mg) per serving.  Check the ingredient list for phosphorus or potassium-based additives or preservatives.  Check the amount of saturated and trans fat. Limit or avoid these fats as told by your dietitian. Shopping  Avoid buying foods that are: ? Processed, frozen, or prepackaged. ? Calcium-enriched or fortified.  Do not buy foods that have salt or sodium listed among the first five ingredients.  Do not buy canned  vegetables. Cooking  Replace animal proteins, such as meat, fish, eggs, or dairy, with plant proteins from beans, nuts, and soy. ? Use soy milk instead of cow's milk. ? Add beans or tofu to soups, casseroles, or pasta dishes instead of meat.  Soak vegetables, such as potatoes, before cooking to reduce potassium. To do this: ? Peel and cut into small pieces. ? Soak in warm water for at least 2 hours. For every 1 cup of vegetables, use 10 cups of water. ? Drain and rinse with warm water. ? Boil for at least 5 minutes. Meal planning  Limit the amount of protein from plant and animal sources you eat each day.  Do not add salt to food when cooking or before eating.  Eat meals and snacks at around the same time each day. If you have diabetes:  If you have diabetes (diabetes mellitus) and chronic kidney disease, it is important to keep your blood glucose in the target range recommended by your health care provider. Follow your diabetes management plan. This may include: ? Checking your blood glucose regularly. ? Taking oral medicines, insulin, or both. ? Exercising for at least 30 minutes on 5 or more days each week, or as told by your health care provider. ? Tracking how many servings of carbohydrates you eat at each meal.  You may be given specific guidelines on how much of certain foods and nutrients you may eat, depending on your stage of kidney disease and whether you have high blood pressure (hypertension). Follow your meal plan as told by your dietitian. What nutrients should be limited? The items listed are not a complete list. Talk with your dietitian about what dietary choices are best for you. Potassium Potassium  affects how steadily your heart beats. If too much potassium builds up in your blood, it can cause an irregular heartbeat or even a heart attack. You may need to eat less potassium, depending on your blood potassium levels and the stage of kidney disease. Talk to your  dietitian about how much potassium you may have each day. You may need to limit or avoid foods that are high in potassium, such as:  Milk and soy milk.  Fruits, such as bananas, papaya, apricots, nectarines, melon, prunes, raisins, kiwi, and oranges.  Vegetables, such as potatoes, sweet potatoes, yams, tomatoes, leafy greens, beets, okra, avocado, pumpkin, and winter squash.  White and lima beans. Phosphorus Phosphorus is a mineral found in your bones. A balance between calcium and phosphorous is needed to build and maintain healthy bones. Too much phosphorus pulls calcium from your bones. This can make your bones weak and more likely to break. Too much phosphorus can also make your skin itch. You may need to eat less phosphorus depending on your blood phosphorus levels and the stage of kidney disease. Talk to your dietitian about how much potassium you may have each day. You may need to take medicine to lower your blood phosphorus levels if diet changes do not help. You may need to limit or avoid foods that are high in phosphorus, such as:  Milk and dairy products.  Dried beans and peas.  Tofu, soy milk, and other soy-based meat replacements.  Colas.  Nuts and peanut butter.  Meat, poultry, and fish.  Bran cereals and oatmeals. Protein Protein helps you to make and keep muscle. It also helps in the repair of your body's cells and tissues. One of the natural breakdown products of protein is a waste product called urea. When your kidneys are not working properly, they cannot remove wastes, such as urea, like they did before you developed chronic kidney disease. Reducing how much protein you eat can help prevent a buildup of urea in your blood. Depending on your stage of kidney disease, you may need to limit foods that are high in protein. Sources of animal protein include:  Meat (all types).  Fish and seafood.  Poultry.  Eggs.  Dairy. Other protein foods include:  Beans and  legumes.  Nuts and nut butter.  Soy and tofu. Sodium Sodium, which is found in salt, helps maintain a healthy balance of fluids in your body. Too much sodium can increase your blood pressure and have a negative effect on the function of your heart and lungs. Too much sodium can also cause your body to retain too much fluid, making your kidneys work harder. Most people should have less than 2,300 milligrams (mg) of sodium each day. If you have hypertension, you may need to limit your sodium to 1,500 mg each day. Talk to your dietitian about how much sodium you may have each day. You may need to limit or avoid foods that are high in sodium, such as:  Salt seasonings.  Soy sauce.  Cured and processed meats.  Salted crackers and snack foods.  Fast food.  Canned soups and most canned foods.  Pickled foods.  Vegetable juice.  Boxed mixes or ready-to-eat boxed meals and side dishes.  Bottled dressings, sauces, and marinades. Summary  Chronic kidney disease can lead to a buildup and imbalance of waste and excess substances in the body. Certain foods lead to a buildup of these substances. By adjusting your intake of these foods, you could help prevent more  kidney damage and delay or prevent the need for dialysis.  Food adjustments are different for each person with chronic kidney disease. Work with a dietitian to set up nutrient goals and a meal plan that is right for you.  If you have diabetes and chronic kidney disease, it is important to keep your blood glucose in the target range recommended by your health care provider. This information is not intended to replace advice given to you by your health care provider. Make sure you discuss any questions you have with your health care provider. Document Revised: 03/18/2019 Document Reviewed: 11/20/2016 Elsevier Patient Education  [employer].  as directed. Please refer to the Current Medication list given to you today.

## 2020-10-13 NOTE — Progress Notes (Signed)
Cardiology Office Note   Date:  10/13/2020   ID:  [name], [name] 16-Dec-1980, MRN [id number]  PCP:  Venita Lick, NP  Cardiologist:   Kathlyn Sacramento, MD   Chief Complaint  Patient presents with  . other    6 month follow up. Meds reviewed by the pt. verbally. Pt. c/o chest pain at times and swelling in hands.       History of Present Illness: [name] [name] is a 39 y.o. female who presents for a follow-up regarding coronary artery disease. She has complex past medical history including type 2 diabetes mellitus which was diagnosed 2002, diabetic gastroparesis with chronic nausea and vomiting, coronary artery disease, ischemic cardiomyopathy with subsequent normalization of LV function, stage II-III chronic kidney disease, hypertension, hyperlipidemia, and obesity.  Cardiac history dates back to April 2016, when she was hospitalized with chest pain and non-STEMI.  She was found to have LV dysfunction with an EF of 30 to 35%, and underwent diagnostic catheterization revealing severe left circumflex and OM1 disease.  Both areas were successfully treated with drug-eluting stents.  Follow-up catheterization in May 2016 revealed patency of both stents with normalization of LV function by ventriculography.   Stress testing was performed in March 2017 which was nonischemic and low risk.  She had recurrent angina in December 2019.  Cardiac catheterization showed patent stent in OM1, focal in-stent restenosis in the left circumflex at the origin of OM 2 and new significant stenosis in the proximal to mid right coronary artery.  I performed balloon angioplasty of the mid left circumflex.  She underwent staged RCA PCI with drug-eluting stent placement.  Diabetes control improved overall with most recent hemoglobin A1c of 7.9. She has known history of severe hyperlipidemia with intolerance to statins. She is currently on Praluent 75 mg every 2 weeks. However, she forgets to take the dose very  frequently and most recent lipid profile showed an LDL of 124.  She has chronic kidney disease followed by nephrology and she has been having issues with hyperkalemia. Potassium has been above 6 frequently and she is currently on Lokelma.  She reports chest pain at rest with no exertional symptoms. Different from prior angina.     Past Medical History:  Diagnosis Date  . CAD (coronary artery disease)    a. 03/2015 NSTEMI/PCI: LCX 150m (DES), OM1 100p (DES - vessel labeled Ramus in subsequent cath report). Minor irregs to LAD/RCA; b. 04/2015 Cath: LM nl, LAD 40p, RI patent stent, LCX patent stent, RCA nl, EF 55-65%; c. 02/2016 MV: basal inferolateral and mid inferolateral defect/infarct w/o ischemia. EF 55-65%; d. 11/2018 PCI: LM 30ost, LAD 30p, LCX 95p/m (PTCA->20%), OM1 10ost, OM2 40ost, RCA 85p.  Marland Kitchen Chronic abdominal pain   . Chronic diastolic (congestive) heart failure ([place])    a. 03/2015 Echo: EF 30-35%, mild concentric LVH, severe HK of inf, inflat, & lat walls, mod MR, mild TR;  b. 04/2015 LV Gram: EF 55-65%; c. 09/2017 Echo: EF 55-60%, no rwma. Mild MR; d. 11/2018 Echo: Ef 60-65%, no rwma.  . CKD (chronic kidney disease), stage III ([place])   . Diabetes type 2, controlled ([place])    a. since 2002   . Diabetic gastroparesis ([facility])    a. chronic nausea/vomiting.  . Diverticulosis, sigmoid 07/2016  . Heavy menses    a. H/O IUD - expired in 2014 - remains in place.  Marland Kitchen Hx of migraines   . Hyperlipidemia    Intolerant of atorvastatin, rosuvastatin  .  Hypertension   . Iron deficiency anemia   . Ischemic cardiomyopathy (resolved)    a. 03/2015 EF 30-35% post NSTEMI;  b. 04/2015 EF 55-65% on LV gram; c. 09/2017 Echo: EF 55-60%; d. 11/2018 Echo: Ef 60-65%.  . Obesity   . Tobacco abuse    a. quit 03/2015.  Marland Kitchen Uterine fibroid   . Vertigo   . Vitamin D deficiency     Past Surgical History:  Procedure Laterality Date  . APPENDECTOMY    . CARDIAC CATHETERIZATION  4/16   x2 stent [facility]  . CARDIAC  CATHETERIZATION N/A 05/05/2015   Procedure: Left Heart Cath and Coronary Angiography;  Surgeon: Peter M Martinique, MD;  Location: [place] [facility];  Service: Cardiovascular;  Laterality: N/A;  . COLONOSCOPY WITH PROPOFOL N/A 07/31/2016   Procedure: COLONOSCOPY WITH PROPOFOL;  Surgeon: Lollie Sails, MD;  Location: [facility] ENDOSCOPY;  Service: Endoscopy;  Laterality: N/A;  . CORONARY BALLOON ANGIOPLASTY N/A 12/04/2018   Procedure: CORONARY BALLOON ANGIOPLASTY;  Surgeon: Wellington Hampshire, MD;  Location: [place] [facility];  Service: Cardiovascular;  Laterality: N/A;  . CORONARY STENT INTERVENTION N/A 01/12/2019   Procedure: CORONARY STENT INTERVENTION;  Surgeon: [name], MD;  Location: [place] [facility];  Service: Cardiovascular;  Laterality: N/A;  . ESOPHAGOGASTRODUODENOSCOPY (EGD) WITH PROPOFOL N/A 07/31/2016   Procedure: ESOPHAGOGASTRODUODENOSCOPY (EGD) WITH PROPOFOL;  Surgeon: Lollie Sails, MD;  Location[facility] ENDOSCOPY;  Service: Endoscopy;  Laterality: N/A;  . INTRAVASCULAR PRESSURE WIRE/FFR STUDY N/A 12/04/2018   Procedure: INTRAVASCULAR PRESSURE WIRE/FFR STUDY;  Surgeon: Wellington Hampshire, MD;  Location: [place] [facility];  Service: Cardiovascular;  Laterality: N/A;  . LAPAROSCOPIC APPENDECTOMY N/A 08/07/2016   Procedure: APPENDECTOMY LAPAROSCOPIC;  Surgeon: Hubbard Robinson, MD;  Location: [facility] ORS;  Service: General;  Laterality: N/A;  . LEFT HEART CATH AND CORONARY ANGIOGRAPHY Left 12/04/2018   Procedure: LEFT HEART CATH AND CORONARY ANGIOGRAPHY;  Surgeon: Wellington Hampshire, MD;  Location: [place] [facility];  Service: Cardiovascular;  Laterality: Left;  . LEFT HEART CATH AND CORONARY ANGIOGRAPHY N/A 01/12/2019   Procedure: LEFT HEART CATH AND CORONARY ANGIOGRAPHY;  Surgeon: [name], MD;  Location: [place] [facility];  Service: Cardiovascular;  Laterality: N/A;  . MOUTH SURGERY       Current Outpatient Medications  Medication Sig Dispense Refill  .  ACCU-CHEK AVIVA PLUS test strip USE 1 STRIP TO CHECK GLUCOSE THREE TIMES DAILY    . amitriptyline (ELAVIL) 25 MG tablet Take 25 mg by mouth at bedtime.    Marland Kitchen amoxicillin-clavulanate (AUGMENTIN) 875-125 MG tablet Take 1 tablet by mouth every 12 (twelve) hours. 14 tablet 0  . aspirin EC 81 MG tablet Take 81 mg by mouth daily.    . benzonatate (TESSALON) 100 MG capsule Take 1 capsule (100 mg total) by mouth 2 (two) times daily as needed for cough. 30 capsule 12  . Cholecalciferol 2000 units TABS Take by mouth daily.     . ciprofloxacin (CIPRO) 500 MG tablet Take 500 mg by mouth daily with breakfast.     . clopidogrel (PLAVIX) 75 MG tablet Take 1 tablet (75 mg total) by mouth daily. 90 tablet 3  . dapagliflozin propanediol (FARXIGA) 10 MG TABS tablet Take 10 mg by mouth daily.    . DULoxetine (CYMBALTA) 30 MG capsule Take 60 mg by mouth daily.   3  . etonogestrel (NEXPLANON) 68 MG IMPL implant 1 each by Subdermal route once.    . famotidine (PEPCID) 40  MG tablet Take 40 mg at bedtime    . fluticasone (FLONASE) 50 MCG/ACT nasal spray Place 2 sprays into both nostrils daily. 15.8 mL 2  . furosemide (LASIX) 40 MG tablet Take 1 tablet (40 mg total) by mouth every other day. 30 tablet 5  . glimepiride (AMARYL) 4 MG tablet Take 4 mg by mouth daily with breakfast.    . insulin aspart (NOVOLOG) 100 UNIT/ML injection Inject 10 Units into the skin 3 (three) times daily with meals. 10 mL 11  . Insulin Glargine (BASAGLAR KWIKPEN) 100 UNIT/ML SOPN Inject 0.3 mLs (30 Units total) into the skin daily. 1 pen 0  . irbesartan (AVAPRO) 75 MG tablet Take 1 tablet (75 mg total) by mouth daily. 90 tablet 3  . isosorbide mononitrate (IMDUR) 60 MG 24 hr tablet Take 1 tablet (60 mg total) by mouth daily. May take an additional half tablet as needed for elevated systolic blood pressure >825. 90 tablet 3  . lactulose (CHRONULAC) 10 GM/15ML solution Take 45 mLs (30 g total) by mouth daily as needed for severe constipation. 946 mL  0  . loperamide (IMODIUM) 2 MG capsule Take by mouth as needed.     . meclizine (ANTIVERT) 25 MG tablet Take 25 mg by mouth 3 (three) times daily as needed for dizziness.    . metoCLOPramide (REGLAN) 10 MG tablet Take 1 tablet (10 mg total) by mouth 4 (four) times daily -  before meals and at bedtime. 120 tablet 0  . metoprolol tartrate (LOPRESSOR) 100 MG tablet Take 1 tablet (100 mg total) by mouth 2 (two) times daily. 180 tablet 3  . mirtazapine (REMERON) 30 MG tablet Take 2 tablets (60 mg total) by mouth at bedtime.    . nitroGLYCERIN (NITROSTAT) 0.4 MG SL tablet Take 1 tab under your tongue while sitting for chest pain, If no relief may repeat, one tab every 5 min up to 3 tabs total over 15 mins. 25 tablet 3  . pantoprazole (PROTONIX) 40 MG tablet Take 1 tablet by mouth twice daily 180 tablet 0  . PRALUENT 75 MG/ML SOAJ Inject 75 mg/mL into the skin every 14 (fourteen) days.     . prochlorperazine (COMPAZINE) 5 MG tablet TAKE 1 TABLET BY MOUTH EVERY 6 HOURS AS NEEDED FOR NAUSEA AND VOMITING    . promethazine (PHENERGAN) 12.5 MG tablet TAKE 1 TABLET BY MOUTH EVERY 6 HOURS AS NEEDED FOR NAUSEA AND VOMITING 30 tablet 2  . promethazine (PHENERGAN) 25 MG suppository Place 1 suppository (25 mg total) rectally every 6 (six) hours as needed for nausea or vomiting. 12 each 0  . senna-docusate (SENOKOT-S) 8.6-50 MG tablet Take 1 tablet by mouth 2 (two) times daily. 60 tablet 0   No current facility-administered medications for this visit.    Allergies:   Lisinopril, Other, Rosemary oil, Shellfish allergy, and Metformin and related    Social History:  The patient  reports that she quit smoking about 5 years ago. She quit after 15.00 years of use. She has never used smokeless tobacco. She reports current drug use. Frequency: 2.00 times per week. Drug: Marijuana. She reports that she does not drink alcohol.   Family History:  The patient's family history includes Cancer in her maternal grandfather and  maternal grandmother; Cancer - Cervical in her maternal aunt; Diabetes in her father, paternal grandfather, and paternal grandmother.    ROS:  Please see the history of present illness.   Otherwise, review of systems are positive for none.  All other systems are reviewed and negative.    PHYSICAL EXAM: VS:  BP 140/76 (BP Location: Left Arm, Patient Position: Sitting, Cuff Size: Large)   Pulse 68   Ht 5\' 5"  (1.651 m)   Wt 238 lb 8 oz (108.2 kg)   SpO2 99%   BMI 39.69 kg/m  , BMI Body mass index is 39.69 kg/m. GEN: Well nourished, well developed, in no acute distress  HEENT: normal  Neck: no JVD, carotid bruits, or masses Cardiac:Mildly tachycardic; no murmurs, rubs, or gallops,no edema  Respiratory:  clear to auscultation bilaterally, normal work of breathing GI: soft, nontender, nondistended, + BS MS: no deformity or atrophy  Skin: warm and dry, no rash Neuro:  Strength and sensation are intact Psych: euthymic mood, full affect   EKG:  EKG is  ordered today. EKG showed normal sinus rhythm with no significant ST or T wave changes. Low voltage.   Recent Labs: 11/20/2019: ALT 15 10/11/2020: BUN 23; Creatinine, Ser 1.61; Hemoglobin 10.5; Platelets 438; Potassium 5.2; Sodium 137    Lipid Panel    Component Value Date/Time   CHOL 217 (H) 08/18/2019 1354   CHOL 106 08/30/2015 1439   CHOL 133 03/31/2015 0225   TRIG 177 (H) 08/18/2019 1354   TRIG 46 08/30/2015 1439   TRIG 77 03/31/2015 0225   HDL 47 08/18/2019 1354   HDL 26 (L) 03/31/2015 0225   CHOLHDL 4.6 (H) 12/17/2018 1145   VLDL 9 08/30/2015 1439   VLDL 15 03/31/2015 0225   LDLCALC 138 (H) 08/18/2019 1354   LDLCALC 92 03/31/2015 0225   LDLDIRECT 163 (H) 12/17/2018 1145      Wt Readings from Last 3 Encounters:  10/13/20 238 lb 8 oz (108.2 kg)  10/11/20 240 lb (108.9 kg)  10/08/20 239 lb 1.6 oz (108.5 kg)        ASSESSMENT AND PLAN:  1.  Coronary artery disease with other forms of angina: She is doing well  at the present time with no anginal symptoms.  Recommend continuing medical therapy.  2.  Chronic diastolic congestive heart failure: She appears to be euvolemic and blood pressure is reasonably controlled.  3.  Essential hypertension: Blood pressure is reasonably controlled on current medications.  4.  Hyperlipidemia: Intolerant to statins due to myalgia.  She is currently on Praluent but does admit that she forgets to take the dose on a regular basis and that likely explains her LDL of 124.  I asked her to be more consistent with this and if LDL remains above 70, I recommend increasing the dose of Praluent and adding Zetia.  5.  CKD II-III: Stable and followed by nephrology.  We provided her with dietary restrictions given chronic kidney disease and hyperkalemia.    Disposition:   FU with me in 6 months  Signed,  Kathlyn Sacramento, MD  10/13/2020 10:29 AM    [facility]

## 2020-10-19 DIAGNOSIS — E875 Hyperkalemia: Secondary | ICD-10-CM | POA: Diagnosis not present

## 2020-11-01 DIAGNOSIS — I10 Essential (primary) hypertension: Secondary | ICD-10-CM | POA: Diagnosis not present

## 2020-11-01 DIAGNOSIS — R801 Persistent proteinuria, unspecified: Secondary | ICD-10-CM | POA: Diagnosis not present

## 2020-11-01 DIAGNOSIS — E1129 Type 2 diabetes mellitus with other diabetic kidney complication: Secondary | ICD-10-CM | POA: Diagnosis not present

## 2020-11-01 DIAGNOSIS — E875 Hyperkalemia: Secondary | ICD-10-CM | POA: Diagnosis not present

## 2020-11-01 DIAGNOSIS — N1832 Chronic kidney disease, stage 3b: Secondary | ICD-10-CM | POA: Diagnosis not present

## 2020-11-08 DIAGNOSIS — E113512 Type 2 diabetes mellitus with proliferative diabetic retinopathy with macular edema, left eye: Secondary | ICD-10-CM | POA: Diagnosis not present

## 2020-11-08 DIAGNOSIS — E113551 Type 2 diabetes mellitus with stable proliferative diabetic retinopathy, right eye: Secondary | ICD-10-CM | POA: Diagnosis not present

## 2020-11-17 ENCOUNTER — Telehealth: Payer: Self-pay | Admitting: Cardiovascular Disease

## 2020-11-17 NOTE — Telephone Encounter (Signed)
Pt c/o of Chest Pain: STAT if CP now or developed within 24 hours  1. Are you having CP right now? no 2. Are you experiencing any other symptoms (ex. SOB, nausea, vomiting, sweating)? htn   3. How long have you been experiencing CP? On and off all week   4. Is your CP continuous or coming and going? yes 5. Have you taken Nitroglycerin? No  ? Scheduled for 12-14 at [address]

## 2020-11-17 NOTE — Telephone Encounter (Signed)
Spoke with the patient.  Appt was scheduled based on our mychart conversation. Adv the patient to bring her BP machine with her to the visit so that it could be chceked for accuracy. Adv the patient to contact our office if symptoms worsen.  Pt verbalized understanding.

## 2020-11-21 ENCOUNTER — Encounter: Payer: Self-pay | Admitting: Cardiovascular Disease

## 2020-11-21 ENCOUNTER — Other Ambulatory Visit: Payer: Self-pay

## 2020-11-21 ENCOUNTER — Ambulatory Visit (INDEPENDENT_AMBULATORY_CARE_PROVIDER_SITE_OTHER): Payer: Medicare Other | Admitting: Cardiovascular Disease

## 2020-11-21 VITALS — BP 150/80 | HR 65 | Ht 65.0 in | Wt 243.0 lb

## 2020-11-21 DIAGNOSIS — I5032 Chronic diastolic (congestive) heart failure: Secondary | ICD-10-CM

## 2020-11-21 DIAGNOSIS — E785 Hyperlipidemia, unspecified: Secondary | ICD-10-CM | POA: Diagnosis not present

## 2020-11-21 DIAGNOSIS — I2510 Atherosclerotic heart disease of native coronary artery without angina pectoris: Secondary | ICD-10-CM | POA: Diagnosis not present

## 2020-11-21 DIAGNOSIS — I25118 Atherosclerotic heart disease of native coronary artery with other forms of angina pectoris: Secondary | ICD-10-CM | POA: Diagnosis not present

## 2020-11-21 DIAGNOSIS — I10 Essential (primary) hypertension: Secondary | ICD-10-CM

## 2020-11-21 MED ORDER — CARVEDILOL 25 MG PO TABS: 25.0000 mg | ORAL_TABLET | Freq: Two times a day (BID) | ORAL | 3 refills | Status: DC

## 2020-11-21 NOTE — Patient Instructions (Signed)
Medication Instructions:   Your physician has recommended you make the following change in your medication:   1.  STOP taking your 2.  START taking Carvedilol 25 MG: Take 1 tab by mouth twice a day.  *If you need a refill on your cardiac medications before your next appointment, please call your pharmacy*   Lab Work: None Ordered If you have labs (blood work) drawn today and your tests are completely normal, you will receive your results only by: Marland Kitchen MyChart Message (if you have MyChart) OR . A paper copy in the mail If you have any lab test that is abnormal or we need to change your treatment, we will call you to review the results.   Testing/Procedures: None Ordered   Follow-Up: At [facility], you and your health needs are our priority.  As part of our continuing mission to provide you with exceptional heart care, we have created designated Provider Care Teams.  These Care Teams include your primary Cardiologist (physician) and Advanced Practice Providers (APPs -  Physician Assistants and Nurse Practitioners) who all work together to provide you with the care you need, when you need it.  We recommend signing up for the patient portal called "MyChart".  Sign up information is provided on this After Visit Summary.  MyChart is used to connect with patients for Virtual Visits (Telemedicine).  Patients are able to view lab/test results, encounter notes, upcoming appointments, etc.  Non-urgent messages can be sent to your provider as well.   To learn more about what you can do with MyChart, go to [email or web address].    Your next appointment:   1 month(s)  The format for your next appointment:   In Person  Provider:   You will see one of the following Advanced Practice Providers on your designated Care Team:    Murray Hodgkins, NP  Christell Faith, PA-C  Marrianne Mood, PA-C  Cadence Shubert, [place]  Laurann Montana, NP      Other Instructions

## 2020-11-21 NOTE — Progress Notes (Signed)
Cardiology Office Note   Date:  11/21/2020   ID:  [name], [name] 05-14-1981, MRN [id number]  PCP:  [name] Lick, NP  Cardiologist:   [name] Sacramento, MD   Chief Complaint  Patient presents with  . Other    Patient c.o chest pain and Elevated BP. Meds reviewed verbally with patient.       History of Present Illness: [name] [name] is a 39 y.o. female who presents for a follow-up regarding coronary artery disease. She has complex past medical history including type 2 diabetes mellitus which was diagnosed 2002, diabetic gastroparesis with chronic nausea and vomiting, coronary artery disease, ischemic cardiomyopathy with subsequent normalization of LV function, stage II-III chronic kidney disease, hypertension, hyperlipidemia, and obesity.  Cardiac history dates back to April 2016, when she was hospitalized with chest pain and non-STEMI.  She was found to have LV dysfunction with an EF of 30 to 35%, and underwent diagnostic catheterization revealing severe left circumflex and OM1 disease.  Both areas were successfully treated with drug-eluting stents.  Follow-up catheterization in May 2016 revealed patency of both stents with normalization of LV function by ventriculography.   Stress testing was performed in March 2017 which was nonischemic and low risk.  She had recurrent angina in December 2019.  Cardiac catheterization showed patent stent in OM1, focal in-stent restenosis in the left circumflex at the origin of OM 2 and new significant stenosis in the proximal to mid right coronary artery.  I performed balloon angioplasty of the mid left circumflex.  She underwent staged RCA PCI with drug-eluting stent placement.  Diabetes control improved overall with most recent hemoglobin A1c of 7.9. She has known history of severe hyperlipidemia with intolerance to statins. She is currently on Praluent 75 mg every 2 weeks. However, she forgets to take the dose very frequently and most recent  lipid profile showed an LDL of 124.  She has chronic kidney disease followed by nephrology and she has been having issues with hyperkalemia. Potassium has been above 6 frequently and she is currently on Lokelma.  She has been under significant stress lately.  She has been concerned about elevated blood pressure at home when she checks with her blood pressure machine.  Usually, her blood pressure is better controlled at the physician's office.  She brought her blood pressure machine with her today and it was clear that the blood pressure cuff is too small.  It did not correlate with our measurement. She complains of intermittent chest pain mostly when her blood pressure is elevated.     Past Medical History:  Diagnosis Date  . CAD (coronary artery disease)    a. 03/2015 NSTEMI/PCI: LCX 130m (DES), OM1 100p (DES - vessel labeled Ramus in subsequent cath report). Minor irregs to LAD/RCA; b. 04/2015 Cath: LM nl, LAD 40p, RI patent stent, LCX patent stent, RCA nl, EF 55-65%; c. 02/2016 MV: basal inferolateral and mid inferolateral defect/infarct w/o ischemia. EF 55-65%; d. 11/2018 PCI: LM 30ost, LAD 30p, LCX 95p/m (PTCA->20%), OM1 10ost, OM2 40ost, RCA 85p.  Marland Kitchen Chronic abdominal pain   . Chronic diastolic (congestive) heart failure ([place])    a. 03/2015 Echo: EF 30-35%, mild concentric LVH, severe HK of inf, inflat, & lat walls, mod MR, mild TR;  b. 04/2015 LV Gram: EF 55-65%; c. 09/2017 Echo: EF 55-60%, no rwma. Mild MR; d. 11/2018 Echo: Ef 60-65%, no rwma.  . CKD (chronic kidney disease), stage III ([place])   . Diabetes type 2, controlled (  [place])    a. since 2002   . Diabetic gastroparesis ([facility])    a. chronic nausea/vomiting.  . Diverticulosis, sigmoid 07/2016  . Heavy menses    a. H/O IUD - expired in 2014 - remains in place.  Marland Kitchen Hx of migraines   . Hyperlipidemia    Intolerant of atorvastatin, rosuvastatin  . Hypertension   . Iron deficiency anemia   . Ischemic cardiomyopathy (resolved)    a. 03/2015  EF 30-35% post NSTEMI;  b. 04/2015 EF 55-65% on LV gram; c. 09/2017 Echo: EF 55-60%; d. 11/2018 Echo: Ef 60-65%.  . Obesity   . Tobacco abuse    a. quit 03/2015.  Marland Kitchen Uterine fibroid   . Vertigo   . Vitamin D deficiency     Past Surgical History:  Procedure Laterality Date  . APPENDECTOMY    . CARDIAC CATHETERIZATION  4/16   x2 stent [facility]  . CARDIAC CATHETERIZATION N/A 05/05/2015   Procedure: Left Heart Cath and Coronary Angiography;  Surgeon: Peter M Martinique, MD;  Location: [place] [facility];  Service: Cardiovascular;  Laterality: N/A;  . COLONOSCOPY WITH PROPOFOL N/A 07/31/2016   Procedure: COLONOSCOPY WITH PROPOFOL;  Surgeon: Lollie Sails, MD;  Location: [facility] ENDOSCOPY;  Service: Endoscopy;  Laterality: N/A;  . CORONARY BALLOON ANGIOPLASTY N/A 12/04/2018   Procedure: CORONARY BALLOON ANGIOPLASTY;  Surgeon: Wellington Hampshire, MD;  Location: [place] [facility];  Service: Cardiovascular;  Laterality: N/A;  . CORONARY STENT INTERVENTION N/A 01/12/2019   Procedure: CORONARY STENT INTERVENTION;  Surgeon: [name], MD;  Location: [place] [facility];  Service: Cardiovascular;  Laterality: N/A;  . ESOPHAGOGASTRODUODENOSCOPY (EGD) WITH PROPOFOL N/A 07/31/2016   Procedure: ESOPHAGOGASTRODUODENOSCOPY (EGD) WITH PROPOFOL;  Surgeon: Lollie Sails, MD;  Location: [facility] ENDOSCOPY;  Service: Endoscopy;  Laterality: N/A;  . INTRAVASCULAR PRESSURE WIRE/FFR STUDY N/A 12/04/2018   Procedure: INTRAVASCULAR PRESSURE WIRE/FFR STUDY;  Surgeon: Wellington Hampshire, MD;  Location: [place] [facility];  Service: Cardiovascular;  Laterality: N/A;  . LAPAROSCOPIC APPENDECTOMY N/A 08/07/2016   Procedure: APPENDECTOMY LAPAROSCOPIC;  Surgeon: Hubbard Robinson, MD;  Location: [facility] ORS;  Service: General;  Laterality: N/A;  . LEFT HEART CATH AND CORONARY ANGIOGRAPHY Left 12/04/2018   Procedure: LEFT HEART CATH AND CORONARY ANGIOGRAPHY;  Surgeon: Wellington Hampshire, MD;  Location: [place] [facility];   Service: Cardiovascular;  Laterality: Left;  . LEFT HEART CATH AND CORONARY ANGIOGRAPHY N/A 01/12/2019   Procedure: LEFT HEART CATH AND CORONARY ANGIOGRAPHY;  Surgeon: [name], MD;  Location: [place] [facility];  Service: Cardiovascular;  Laterality: N/A;  . MOUTH SURGERY       Current Outpatient Medications  Medication Sig Dispense Refill  . ACCU-CHEK AVIVA PLUS test strip USE 1 STRIP TO CHECK GLUCOSE THREE TIMES DAILY    . amitriptyline (ELAVIL) 25 MG tablet Take 25 mg by mouth at bedtime.    Marland Kitchen aspirin EC 81 MG tablet Take 81 mg by mouth daily.    . benzonatate (TESSALON) 100 MG capsule Take 1 capsule (100 mg total) by mouth 2 (two) times daily as needed for cough. 30 capsule 12  . Cholecalciferol 2000 units TABS Take by mouth daily.     . clopidogrel (PLAVIX) 75 MG tablet Take 1 tablet (75 mg total) by mouth daily. 90 tablet 3  . dapagliflozin propanediol (FARXIGA) 10 MG TABS tablet Take 10 mg by mouth daily.    . DULoxetine (CYMBALTA) 30 MG capsule Take 60 mg by mouth daily.  3  . etonogestrel (NEXPLANON) 68 MG IMPL implant 1 each by Subdermal route once.    . famotidine (PEPCID) 40 MG tablet Take 40 mg at bedtime    . fluticasone (FLONASE) 50 MCG/ACT nasal spray Place 2 sprays into both nostrils daily. 15.8 mL 2  . furosemide (LASIX) 40 MG tablet Take 1 tablet (40 mg total) by mouth every other day. 30 tablet 5  . glimepiride (AMARYL) 4 MG tablet Take 4 mg by mouth daily with breakfast.    . insulin aspart (NOVOLOG) 100 UNIT/ML injection Inject 10 Units into the skin 3 (three) times daily with meals. 10 mL 11  . Insulin Glargine (BASAGLAR KWIKPEN) 100 UNIT/ML SOPN Inject 0.3 mLs (30 Units total) into the skin daily. 1 pen 0  . irbesartan (AVAPRO) 75 MG tablet Take 1 tablet (75 mg total) by mouth daily. 90 tablet 3  . isosorbide mononitrate (IMDUR) 60 MG 24 hr tablet Take 1 tablet (60 mg total) by mouth daily. May take an additional half tablet as needed for elevated systolic  blood pressure >308. 90 tablet 3  . lactulose (CHRONULAC) 10 GM/15ML solution Take 45 mLs (30 g total) by mouth daily as needed for severe constipation. 946 mL 0  . LOKELMA 10 g PACK packet Take 10 g by mouth 3 (three) times a week.    . loperamide (IMODIUM) 2 MG capsule Take by mouth as needed.     . meclizine (ANTIVERT) 25 MG tablet Take 25 mg by mouth 3 (three) times daily as needed for dizziness.    . metoprolol tartrate (LOPRESSOR) 100 MG tablet Take 1 tablet (100 mg total) by mouth 2 (two) times daily. 180 tablet 3  . mirtazapine (REMERON) 30 MG tablet Take 2 tablets (60 mg total) by mouth at bedtime.    . nitroGLYCERIN (NITROSTAT) 0.4 MG SL tablet Take 1 tab under your tongue while sitting for chest pain, If no relief may repeat, one tab every 5 min up to 3 tabs total over 15 mins. 25 tablet 3  . pantoprazole (PROTONIX) 40 MG tablet Take 1 tablet by mouth twice daily 180 tablet 0  . PRALUENT 75 MG/ML SOAJ Inject 75 mg/mL into the skin every 14 (fourteen) days.     . prochlorperazine (COMPAZINE) 5 MG tablet TAKE 1 TABLET BY MOUTH EVERY 6 HOURS AS NEEDED FOR NAUSEA AND VOMITING    . promethazine (PHENERGAN) 12.5 MG tablet TAKE 1 TABLET BY MOUTH EVERY 6 HOURS AS NEEDED FOR NAUSEA AND VOMITING 30 tablet 2  . promethazine (PHENERGAN) 25 MG suppository Place 1 suppository (25 mg total) rectally every 6 (six) hours as needed for nausea or vomiting. 12 each 0  . senna-docusate (SENOKOT-S) 8.6-50 MG tablet Take 1 tablet by mouth 2 (two) times daily. 60 tablet 0  . metoCLOPramide (REGLAN) 10 MG tablet Take 1 tablet (10 mg total) by mouth 4 (four) times daily -  before meals and at bedtime. 120 tablet 0   No current facility-administered medications for this visit.    Allergies:   Lisinopril, Other, Rosemary oil, Shellfish allergy, and Metformin and related    Social History:  The patient  reports that she quit smoking about 5 years ago. She quit after 15.00 years of use. She has never used  smokeless tobacco. She reports current drug use. Frequency: 2.00 times per week. Drug: Marijuana. She reports that she does not drink alcohol.   Family History:  The patient's family history includes Cancer in her maternal grandfather  and maternal grandmother; Cancer - Cervical in her maternal aunt; Diabetes in her father, paternal grandfather, and paternal grandmother.    ROS:  Please see the history of present illness.   Otherwise, review of systems are positive for none.   All other systems are reviewed and negative.    PHYSICAL EXAM: VS:  BP (!) 150/80 (BP Location: Left Arm, Patient Position: Sitting, Cuff Size: Large)   Pulse 65   Ht 5\' 5"  (1.651 m)   Wt 243 lb (110.2 kg)   SpO2 99%   BMI 40.44 kg/m  , BMI Body mass index is 40.44 kg/m. GEN: Well nourished, well developed, in no acute distress  HEENT: normal  Neck: no JVD, carotid bruits, or masses Cardiac:Mildly tachycardic; no murmurs, rubs, or gallops,no edema  Respiratory:  clear to auscultation bilaterally, normal work of breathing GI: soft, nontender, nondistended, + BS MS: no deformity or atrophy  Skin: warm and dry, no rash Neuro:  Strength and sensation are intact Psych: euthymic mood, full affect   EKG:  EKG is  ordered today. EKG showed normal sinus rhythm with no significant ST or T wave changes.    Recent Labs: 10/11/2020: BUN 23; Creatinine, Ser 1.61; Hemoglobin 10.5; Platelets 438; Potassium 5.2; Sodium 137    Lipid Panel    Component Value Date/Time   CHOL 217 (H) 08/18/2019 1354   CHOL 106 08/30/2015 1439   CHOL 133 03/31/2015 0225   TRIG 177 (H) 08/18/2019 1354   TRIG 46 08/30/2015 1439   TRIG 77 03/31/2015 0225   HDL 47 08/18/2019 1354   HDL 26 (L) 03/31/2015 0225   CHOLHDL 4.6 (H) 12/17/2018 1145   VLDL 9 08/30/2015 1439   VLDL 15 03/31/2015 0225   LDLCALC 138 (H) 08/18/2019 1354   LDLCALC 92 03/31/2015 0225   LDLDIRECT 163 (H) 12/17/2018 1145      Wt Readings from Last 3 Encounters:   11/21/20 243 lb (110.2 kg)  10/13/20 238 lb 8 oz (108.2 kg)  10/11/20 240 lb (108.9 kg)        ASSESSMENT AND PLAN:  1.  Coronary artery disease with other forms of angina: Intermittent atypical chest pain mostly in the setting of stress and with elevated blood pressure.  We work in controlling blood pressure before considering any further ischemic cardiac evaluation.  2.  Chronic diastolic congestive heart failure: She appears to be euvolemic and blood pressure is reasonably controlled.  3.  Essential hypertension: I advised her to obtain a blood pressure machine with a larger cuff.  Nonetheless, her blood pressure is still elevated and I elected to switch her from metoprolol to carvedilol 25 mg twice daily for better blood pressure control.  If blood pressure remains elevated, we can add small dose amlodipine.    4.  Hyperlipidemia: Intolerant to statins due to myalgia.  She is currently on Praluent but does admit that she forgets to take the dose on a regular basis and that likely explains her LDL of 124.  I asked her to be more consistent with this and if LDL remains above 70, I recommend increasing the dose of Praluent and adding Zetia.  5.  CKD II-III: Stable and followed by nephrology.      Disposition:   FU with APP in 1 months  Signed,  [name] Sacramento, MD  11/21/2020 3:45 PM    [place]

## 2020-12-02 ENCOUNTER — Other Ambulatory Visit: Payer: Self-pay

## 2020-12-04 MED ORDER — FUROSEMIDE 40 MG PO TABS: 40.0000 mg | ORAL_TABLET | ORAL | 5 refills | Status: DC

## 2020-12-04 NOTE — Telephone Encounter (Signed)
Spoke with pt who state she continues to experience on and off chest pain despite medication changes. She report symptoms are better but still occurring. Pt denies current active chest pain. Pt also report last week she developed swelling in left leg that now has progressed to both. She report swelling is from thigh down to her ankle. Symptoms are better with elevations but reoccur when up walking around. Pt also report not taken lasix in a long time and sent a refill request this morning. She denies chest pain but will route to MD for recommendations.

## 2020-12-06 ENCOUNTER — Telehealth: Payer: Self-pay

## 2020-12-06 MED ORDER — FUROSEMIDE 40 MG PO TABS: 40.0000 mg | ORAL_TABLET | ORAL | 5 refills | Status: DC

## 2020-12-06 NOTE — Telephone Encounter (Signed)
Re-ordered prescription for Lasix as requested by patient through [facility]. It was ordered as a no print.

## 2020-12-13 DIAGNOSIS — E113512 Type 2 diabetes mellitus with proliferative diabetic retinopathy with macular edema, left eye: Secondary | ICD-10-CM | POA: Diagnosis not present

## 2020-12-13 DIAGNOSIS — H4311 Vitreous hemorrhage, right eye: Secondary | ICD-10-CM | POA: Diagnosis not present

## 2020-12-20 DIAGNOSIS — E669 Obesity, unspecified: Secondary | ICD-10-CM | POA: Diagnosis not present

## 2020-12-20 DIAGNOSIS — E1169 Type 2 diabetes mellitus with other specified complication: Secondary | ICD-10-CM | POA: Diagnosis not present

## 2020-12-21 NOTE — Progress Notes (Signed)
Cardiology Office Note:    Date:  12/21/2020   ID:  [name] [name], DOB 03-31-1981, MRN [id number]  PCP:  Venita Lick, NP  [facility] Cardiologist:  Kathlyn Sacramento, MD  [facility] Electrophysiologist:  None   Referring MD: Venita Lick, NP   Chief Complaint: 1 month follow-up  History of Present Illness:    [name] [name] is a 40 y.o. female with a hx of CAD, DM2, diabetic gastroparesis with chronic nausea and vomiting, ischemic cardiomyopathy with subsequent normalization of EF, CKD stage 2-3, HTN, HLD, and obesity who presents for follow-up. Cardiac history dates back to April 2016, when she was hospitazlied for chest pain and NSTEMI. She was found to have LV dysfunction with an EF of 30 to 35% and underwent diagnostic catheterization revealing severe left circumflex and OM1 disease. Both areas were successfully treated with DES. Follow-up cath in May 2016 revealed patency of both stents with normalization of LV function. Stress test in March 2017 was nonischemic and low risk.   She had recurrent angina in December 2019. Cardiac cath showed patent stent in OM1, focal restenosis in the left circumflex at the origin of OM2 and new significant stenosis in the proximal to mid RCA. Balloon angioplasty was performed to the left circumflex. She underwent staged RCA PCI with DES.   She has a history of intolerance to statins and is on Praluent. She has CKD and is followed by nephrology.   She was last seen 11/21/20 reporting high stress levels and intermittent atypical chest pain. BP Was elevated. A larger BP cuff was recommended. Metoprolol was switched to coreg. Favored BP adjustment before pursuing ischemic evaluation.   Today she reports BP is still elevated at times but they are overall better. She has not yet obtained a new BP cuff. She will have one on February 3rd. She has been taking her pressures at home with the old cuff, this morning BP was 166/92. Last night it was  125/82. In the office it was good at 120/80. She still reports occasional has chest pain however this has also greatly improved. She describes the pain feels as a twinge or tingling. It is very brief and sometimes radiates down the left arm. It is not worse with exertion. It can sometimes happen during the night. Denies sob, nausea, vomiting, diaphoresis. She also reported elevated potassium and chloride levels for the last few months being followed by neophrology. She is now taking lokelma 3 times a week. Most recent level was 5.2 in 10/2020. No recent fever or chills. She plans to get a bigger BP cuff next month. Over christmas she noted worsening swelling and took lasix which improved the swelling. No volume overload on exam today.    Past Medical History:  Diagnosis Date  . CAD (coronary artery disease)    a. 03/2015 NSTEMI/PCI: LCX 137m (DES), OM1 100p (DES - vessel labeled Ramus in subsequent cath report). Minor irregs to LAD/RCA; b. 04/2015 Cath: LM nl, LAD 40p, RI patent stent, LCX patent stent, RCA nl, EF 55-65%; c. 02/2016 MV: basal inferolateral and mid inferolateral defect/infarct w/o ischemia. EF 55-65%; d. 11/2018 PCI: LM 30ost, LAD 30p, LCX 95p/m (PTCA->20%), OM1 10ost, OM2 40ost, RCA 85p.  Marland Kitchen Chronic abdominal pain   . Chronic diastolic (congestive) heart failure ([place])    a. 03/2015 Echo: EF 30-35%, mild concentric LVH, severe HK of inf, inflat, & lat walls, mod MR, mild TR;  b. 04/2015 LV Gram: EF 55-65%; c. 09/2017 Echo: EF  55-60%, no rwma. Mild MR; d. 11/2018 Echo: Ef 60-65%, no rwma.  . CKD (chronic kidney disease), stage III ([place])   . Diabetes type 2, controlled ([place])    a. since 2002   . Diabetic gastroparesis ([facility])    a. chronic nausea/vomiting.  . Diverticulosis, sigmoid 07/2016  . Heavy menses    a. H/O IUD - expired in 2014 - remains in place.  Marland Kitchen Hx of migraines   . Hyperlipidemia    Intolerant of atorvastatin, rosuvastatin  . Hypertension   . Iron deficiency anemia   .  Ischemic cardiomyopathy (resolved)    a. 03/2015 EF 30-35% post NSTEMI;  b. 04/2015 EF 55-65% on LV gram; c. 09/2017 Echo: EF 55-60%; d. 11/2018 Echo: Ef 60-65%.  . Obesity   . Tobacco abuse    a. quit 03/2015.  Marland Kitchen Uterine fibroid   . Vertigo   . Vitamin D deficiency     Past Surgical History:  Procedure Laterality Date  . APPENDECTOMY    . CARDIAC CATHETERIZATION  4/16   x2 stent [facility]  . CARDIAC CATHETERIZATION N/A 05/05/2015   Procedure: Left Heart Cath and Coronary Angiography;  Surgeon: Peter M Martinique, MD;  Location: [place] [facility];  Service: Cardiovascular;  Laterality: N/A;  . COLONOSCOPY WITH PROPOFOL N/A 07/31/2016   Procedure: COLONOSCOPY WITH PROPOFOL;  Surgeon: Lollie Sails, MD;  Location: [facility] ENDOSCOPY;  Service: Endoscopy;  Laterality: N/A;  . CORONARY BALLOON ANGIOPLASTY N/A 12/04/2018   Procedure: CORONARY BALLOON ANGIOPLASTY;  Surgeon: Wellington Hampshire, MD;  Location: [place] [facility];  Service: Cardiovascular;  Laterality: N/A;  . CORONARY STENT INTERVENTION N/A 01/12/2019   Procedure: CORONARY STENT INTERVENTION;  Surgeon: [name], MD;  Location: [place] [facility];  Service: Cardiovascular;  Laterality: N/A;  . ESOPHAGOGASTRODUODENOSCOPY (EGD) WITH PROPOFOL N/A 07/31/2016   Procedure: ESOPHAGOGASTRODUODENOSCOPY (EGD) WITH PROPOFOL;  Surgeon: Lollie Sails, MD;  Location: [facility] ENDOSCOPY;  Service: Endoscopy;  Laterality: N/A;  . INTRAVASCULAR PRESSURE WIRE/FFR STUDY N/A 12/04/2018   Procedure: INTRAVASCULAR PRESSURE WIRE/FFR STUDY;  Surgeon: Wellington Hampshire, MD;  Location: [place] [facility];  Service: Cardiovascular;  Laterality: N/A;  . LAPAROSCOPIC APPENDECTOMY N/A 08/07/2016   Procedure: APPENDECTOMY LAPAROSCOPIC;  Surgeon: Hubbard Robinson, MD;  Location: [facility] ORS;  Service: General;  Laterality: N/A;  . LEFT HEART CATH AND CORONARY ANGIOGRAPHY Left 12/04/2018   Procedure: LEFT HEART CATH AND CORONARY ANGIOGRAPHY;  Surgeon: Wellington Hampshire, MD;  Location: [place] [facility];  Service: Cardiovascular;  Laterality: Left;  . LEFT HEART CATH AND CORONARY ANGIOGRAPHY N/A 01/12/2019   Procedure: LEFT HEART CATH AND CORONARY ANGIOGRAPHY;  Surgeon: [name], MD;  Location: [place] [facility];  Service: Cardiovascular;  Laterality: N/A;  . MOUTH SURGERY      Current Medications: No outpatient medications have been marked as taking for the 12/22/20 encounter (Appointment) with Rise Mu, PA-C.     Allergies:   Lisinopril, Other, Rosemary oil, Shellfish allergy, and Metformin and related   Social History   Socioeconomic History  . Marital status: Married    Spouse name: Loss adjuster, chartered  . Number of children: Not on file  . Years of education: Not on file  . Highest education level: Not on file  Occupational History  . Not on file  Tobacco Use  . Smoking status: Former Smoker    Years: 15.00    Quit date: 03/10/2015    Years since quitting: 5.7  . Smokeless tobacco:  Never Used  Vaping Use  . Vaping Use: Never used  Substance and Sexual Activity  . Alcohol use: No    Alcohol/week: 0.0 standard drinks  . Drug use: Yes    Frequency: 2.0 times per week    Types: Marijuana  . Sexual activity: Yes    Birth control/protection: Implant  Other Topics Concern  . Not on file  Social History Narrative   Lives locally with daughter and husband.   Social Determinants of Health   Financial Resource Strain: Medium Risk  . Difficulty of Paying Living Expenses: Somewhat hard  Food Insecurity: No Food Insecurity  . Worried About Charity fundraiser in the Last Year: Never true  . Ran Out of Food in the Last Year: Never true  Transportation Needs: No Transportation Needs  . Lack of Transportation (Medical): No  . Lack of Transportation (Non-Medical): No  Physical Activity: Insufficiently Active  . Days of Exercise per Week: 7 days  . Minutes of Exercise per Session: 20 min  Stress: Stress Concern Present  . Feeling  of Stress : To some extent  Social Connections: Not on file     Family History: The patient's family history includes Cancer in her maternal grandfather and maternal grandmother; Cancer - Cervical in her maternal aunt; Diabetes in her father, paternal grandfather, and paternal grandmother.  ROS:   Please see the history of present illness.     All other systems reviewed and are negative.  EKGs/Labs/Other Studies Reviewed:    The following studies were reviewed today:  Cardiac cath 01/2019 Conclusions: 1. Stable appearance of coronary arteries since 12/04/2018, with 80-90% mid RCA disease and 20-30% in-stent restenosis in mid LCx. 2. Moderately to severely elevated left ventricular filling pressure. 3. Successful PCI to mid RCA using Synergy 2.5 x 32 mm drug-eluting stent (postdilated to 2.8 mm) with 0% residual stenosis and TIMI-3 flow.  Recommendations: 1. Overnight extended recovery. 2. Continue dual antiplatelet therapy with aspirin and clopidogrel (through at least 12/05/2019 but ideally longer). 3. Aggressive secondary prevention; plans already made to initiate PCSK9 inhibitor as an outpatient. 4. Defer post catheterization hydration in the setting of moderately to severely elevated LVEDP.  If renal function tolerates, consider initiation of gentle diuresis in the morning.  [name], MD [facility] Pager: [phone]   Coronary Diagrams   Diagnostic Dominance: Right    Intervention      Long term monitor 01/2019 14-day monitor:  Normal sinus rhythm with an average heart rate of 94 bpm.  Intermittent sinus tachycardia. Triggered events by the patient did not correlate with significant arrhythmia.  She was in sinus rhythm or sinus tachycardia during these events. Rare PVCs.  Echo 11/2018 Study Conclusions   - Left ventricle: The cavity size was normal. There was moderate  concentric hypertrophy. Systolic function was normal. The   estimated ejection fraction was in the range of 60% to 65%. Wall  motion was normal; there were no regional wall motion  abnormalities. Left ventricular diastolic function parameters  were normal.  - Pulmonary arteries: Systolic pressure could not be accurately  estimated.   EKG:  EKG is not ordered today.   Recent Labs: 10/11/2020: BUN 23; Creatinine, Ser 1.61; Hemoglobin 10.5; Platelets 438; Potassium 5.2; Sodium 137  Recent Lipid Panel    Component Value Date/Time   CHOL 217 (H) 08/18/2019 1354   CHOL 106 08/30/2015 1439   CHOL 133 03/31/2015 0225   TRIG 177 (H) 08/18/2019 1354   TRIG  46 08/30/2015 1439   TRIG 77 03/31/2015 0225   HDL 47 08/18/2019 1354   HDL 26 (L) 03/31/2015 0225   CHOLHDL 4.6 (H) 12/17/2018 1145   VLDL 9 08/30/2015 1439   VLDL 15 03/31/2015 0225   LDLCALC 138 (H) 08/18/2019 1354   LDLCALC 92 03/31/2015 0225   LDLDIRECT 163 (H) 12/17/2018 1145     Risk Assessment/Calculations:       Physical Exam:    VS:  There were no vitals taken for this visit.    Wt Readings from Last 3 Encounters:  11/21/20 243 lb (110.2 kg)  10/13/20 238 lb 8 oz (108.2 kg)  10/11/20 240 lb (108.9 kg)     GEN:  Well nourished, well developed in no acute distress HEENT: Normal NECK: No JVD; No carotid bruits LYMPHATICS: No lymphadenopathy CARDIAC: RRR, no murmurs, rubs, gallops RESPIRATORY:  Clear to auscultation without rales, wheezing or rhonchi  ABDOMEN: Soft, non-tender, non-distended MUSCULOSKELETAL:  No edema; No deformity  SKIN: Warm and dry NEUROLOGIC:  Alert and oriented x 3 PSYCHIATRIC:  Normal affect   ASSESSMENT:    1. Coronary artery disease involving native coronary artery of native heart with other form of angina pectoris ([place])   2. Hyperlipidemia, unspecified hyperlipidemia type   3. Stage 3a chronic kidney disease ([place])   4. Chronic diastolic heart failure ([place])   5. Essential hypertension    PLAN:    In order of problems listed  above:  CAD with intermittent atypical chest pain Patient reports improved chest pain with improvement of blood pressures. She still has occasional atypical chest pain however episodes are brief and non-exertional. No associated symptoms. Will plan to reassess BP in a month once she can get the larger BP cuff.   Chronic diastolic heart failure Patient reported swelling over the holidays and took lasix with improvement. Patient is euvolemic on exam today.   HTN Metoprolol switched to coreg 25mg  BID at the last visit. Overall symptoms are improving. BP today is great at 120/80. Pressures at home are labile however patient has not yet to obtained a larger BP cuff. She will have one by February 3rd. Continue Imdur and Avapro. Once she can get her larger BP cuff recommend daily blood pressures, and we will see her in the office after a week to reassess need for possible medication changes.  HLD Intolerance to statins. Continue Praluent. Can re-check lipids at follow-up  CKD stage 2-3 She is followed closely by nephrology.   Disposition: Follow up in 1 month or after larger BP cuff obtained with PA  Shared Decision Making/Informed Consent        Signed, Laticha Ferrucci Ninfa Meeker, PA-C  12/21/2020 2:23 PM    [place]

## 2020-12-22 ENCOUNTER — Ambulatory Visit (INDEPENDENT_AMBULATORY_CARE_PROVIDER_SITE_OTHER): Payer: Medicare Other | Admitting: Medical

## 2020-12-22 ENCOUNTER — Encounter: Payer: Self-pay | Admitting: Physician Assistant

## 2020-12-22 ENCOUNTER — Other Ambulatory Visit: Payer: Self-pay

## 2020-12-22 VITALS — BP 120/80 | HR 72 | Ht 65.0 in | Wt 237.0 lb

## 2020-12-22 DIAGNOSIS — E785 Hyperlipidemia, unspecified: Secondary | ICD-10-CM

## 2020-12-22 DIAGNOSIS — I25118 Atherosclerotic heart disease of native coronary artery with other forms of angina pectoris: Secondary | ICD-10-CM | POA: Diagnosis not present

## 2020-12-22 DIAGNOSIS — N1831 Chronic kidney disease, stage 3a: Secondary | ICD-10-CM

## 2020-12-22 DIAGNOSIS — I10 Essential (primary) hypertension: Secondary | ICD-10-CM

## 2020-12-22 DIAGNOSIS — I5032 Chronic diastolic (congestive) heart failure: Secondary | ICD-10-CM

## 2020-12-22 NOTE — Patient Instructions (Addendum)
Medication Instructions:  No changes  *If you need a refill on your cardiac medications before your next appointment, please call your pharmacy*   Lab Work: None  If you have labs (blood work) drawn today and your tests are completely normal, you will receive your results only by: Marland Kitchen MyChart Message (if you have MyChart) OR . A paper copy in the mail If you have any lab test that is abnormal or we need to change your treatment, we will call you to review the results.   Testing/Procedures: None   Follow-Up: At [facility], you and your health needs are our priority.  As part of our continuing mission to provide you with exceptional heart care, we have created designated Provider Care Teams.  These Care Teams include your primary Cardiologist (physician) and Advanced Practice Providers (APPs -  Physician Assistants and Nurse Practitioners) who all work together to provide you with the care you need, when you need it.   Your next appointment:   One week after January 11, 2021  The format for your next appointment:   In Person  Provider:   You may see Kathlyn Sacramento, MD or one of the following Advanced Practice Providers on your designated Care Team:    Murray Hodgkins, NP  Christell Faith, PA-C  Marrianne Mood, PA-C  Cadence Kathlen Mody, [place]  Laurann Montana, NP    Other Instructions Please bring in new blood pressure cuff to next appointment. Also review additional information on blood pressure monitoring.  Please monitor blood pressures and keep a log of your readings.  Make sure to check 2 hours after your medications.   AVOID these things for 30 minutes before checking your blood pressure:  Drinking caffeine.  Drinking alcohol.  Eating.  Smoking.  Exercising.  Five minutes before checking your blood pressure:  Pee.  Sit in a dining chair. Avoid sitting in a soft couch or armchair.  Be quiet. Do not talk.   How to Take Your Blood Pressure Blood  pressure measures how strongly your blood is pressing against the walls of your arteries. Arteries are blood vessels that carry blood from your heart throughout your body. You can take your blood pressure at home with a machine. You may need to check your blood pressure at home:  To check if you have high blood pressure (hypertension).  To check your blood pressure over time.  To make sure your blood pressure medicine is working. Supplies needed:  Blood pressure machine, or monitor.  Dining room chair to sit in.  Table or desk.  Small notebook.  Pencil or pen. How to prepare Avoid these things for 30 minutes before checking your blood pressure:  Having drinks with caffeine in them, such as coffee or tea.  Drinking alcohol.  Eating.  Smoking.  Exercising. Do these things five minutes before checking your blood pressure:  Go to the bathroom and pee (urinate).  Sit in a dining chair. Do not sit in a soft couch or an armchair.  Be quiet. Do not talk. How to take your blood pressure Follow the instructions that came with your machine. If you have a digital blood pressure monitor, these may be the instructions: 1. Sit up straight. 2. Place your feet on the floor. Do not cross your ankles or legs. 3. Rest your left arm at the level of your heart. You may rest it on a table, desk, or chair. 4. Pull up your shirt sleeve. 5. Wrap the blood pressure cuff around  the upper part of your left arm. The cuff should be 1 inch (2.5 cm) above your elbow. It is best to wrap the cuff around bare skin. 6. Fit the cuff snugly around your arm. You should be able to place only one finger between the cuff and your arm. 7. Place the cord so that it rests in the bend of your elbow. 8. Press the power button. 9. Sit quietly while the cuff fills with air and loses air. 10. Write down the numbers on the screen. 11. Wait 2-3 minutes and then repeat steps 1-10.   What do the numbers mean? Two  numbers make up your blood pressure. The first number is called systolic pressure. The second is called diastolic pressure. An example of a blood pressure reading is "120 over 80" (or 120/80). If you are an adult and do not have a medical condition, use this guide to find out if your blood pressure is normal: Normal  First number: below 120.  Second number: below 80. Elevated  First number: 120-129.  Second number: below 80. Hypertension stage 1  First number: 130-139.  Second number: 80-89. Hypertension stage 2  First number: 140 or above.  Second number: 38 or above. Your blood pressure is above normal even if only the top or bottom number is above normal. Follow these instructions at home:  Check your blood pressure as often as your doctor tells you to.  Check your blood pressure at the same time every day.  Take your monitor to your next doctor's appointment. Your doctor will: ? Make sure you are using it correctly. ? Make sure it is working right.  Make sure you understand what your blood pressure numbers should be.  Tell your doctor if your medicine is causing side effects.  Keep all follow-up visits as told by your doctor. This is important. General tips:  You will need a blood pressure machine, or monitor. Your doctor can suggest a monitor. You can buy one at a drugstore or online. When choosing one: ? Choose one with an arm cuff. ? Choose one that wraps around your upper arm. Only one finger should fit between your arm and the cuff. ? Do not choose one that measures your blood pressure from your wrist or finger. Where to find more information American Heart Association: www.heart.org Contact a doctor if:  Your blood pressure keeps being high. Get help right away if:  Your first blood pressure number is higher than 180.  Your second blood pressure number is higher than 120. Summary  Check your blood pressure at the same time every day.  Avoid  caffeine, alcohol, smoking, and exercise for 30 minutes before checking your blood pressure.  Make sure you understand what your blood pressure numbers should be. This information is not intended to replace advice given to you by your health care provider. Make sure you discuss any questions you have with your health care provider. Document Revised: 11/19/2019 Document Reviewed: 11/19/2019 Elsevier Patient Education  2021 [employer].

## 2020-12-27 DIAGNOSIS — E1159 Type 2 diabetes mellitus with other circulatory complications: Secondary | ICD-10-CM | POA: Diagnosis not present

## 2020-12-27 DIAGNOSIS — E1143 Type 2 diabetes mellitus with diabetic autonomic (poly)neuropathy: Secondary | ICD-10-CM | POA: Diagnosis not present

## 2020-12-27 DIAGNOSIS — I700 Atherosclerosis of aorta: Secondary | ICD-10-CM | POA: Diagnosis not present

## 2020-12-27 DIAGNOSIS — E875 Hyperkalemia: Secondary | ICD-10-CM | POA: Diagnosis not present

## 2020-12-27 DIAGNOSIS — Z794 Long term (current) use of insulin: Secondary | ICD-10-CM | POA: Diagnosis not present

## 2020-12-27 DIAGNOSIS — N1832 Chronic kidney disease, stage 3b: Secondary | ICD-10-CM | POA: Diagnosis not present

## 2020-12-27 DIAGNOSIS — E1169 Type 2 diabetes mellitus with other specified complication: Secondary | ICD-10-CM | POA: Diagnosis not present

## 2020-12-27 DIAGNOSIS — I10 Essential (primary) hypertension: Secondary | ICD-10-CM | POA: Diagnosis not present

## 2020-12-27 DIAGNOSIS — E1122 Type 2 diabetes mellitus with diabetic chronic kidney disease: Secondary | ICD-10-CM | POA: Diagnosis not present

## 2020-12-27 DIAGNOSIS — E11311 Type 2 diabetes mellitus with unspecified diabetic retinopathy with macular edema: Secondary | ICD-10-CM | POA: Diagnosis not present

## 2020-12-27 DIAGNOSIS — E669 Obesity, unspecified: Secondary | ICD-10-CM | POA: Diagnosis not present

## 2021-01-02 ENCOUNTER — Other Ambulatory Visit: Payer: Self-pay | Admitting: *Deleted

## 2021-01-02 MED ORDER — PRALUENT 75 MG/ML ~~LOC~~ SOAJ
75.0000 mg | SUBCUTANEOUS | 11 refills | Status: DC
Start: 2021-01-02 — End: 2021-08-06

## 2021-01-10 ENCOUNTER — Encounter: Payer: Self-pay | Admitting: Physician Assistant

## 2021-01-10 ENCOUNTER — Ambulatory Visit (INDEPENDENT_AMBULATORY_CARE_PROVIDER_SITE_OTHER): Payer: Medicare Other | Admitting: Physician Assistant

## 2021-01-10 ENCOUNTER — Other Ambulatory Visit: Payer: Self-pay

## 2021-01-10 VITALS — BP 138/82 | HR 70 | Ht 65.0 in | Wt 244.0 lb

## 2021-01-10 DIAGNOSIS — N1831 Chronic kidney disease, stage 3a: Secondary | ICD-10-CM | POA: Diagnosis not present

## 2021-01-10 DIAGNOSIS — E785 Hyperlipidemia, unspecified: Secondary | ICD-10-CM | POA: Diagnosis not present

## 2021-01-10 DIAGNOSIS — I10 Essential (primary) hypertension: Secondary | ICD-10-CM | POA: Diagnosis not present

## 2021-01-10 DIAGNOSIS — Z794 Long term (current) use of insulin: Secondary | ICD-10-CM

## 2021-01-10 DIAGNOSIS — I5042 Chronic combined systolic (congestive) and diastolic (congestive) heart failure: Secondary | ICD-10-CM

## 2021-01-10 DIAGNOSIS — Z6841 Body Mass Index (BMI) 40.0 and over, adult: Secondary | ICD-10-CM | POA: Diagnosis not present

## 2021-01-10 DIAGNOSIS — I25118 Atherosclerotic heart disease of native coronary artery with other forms of angina pectoris: Secondary | ICD-10-CM

## 2021-01-10 DIAGNOSIS — E1122 Type 2 diabetes mellitus with diabetic chronic kidney disease: Secondary | ICD-10-CM

## 2021-01-10 DIAGNOSIS — N183 Chronic kidney disease, stage 3 unspecified: Secondary | ICD-10-CM

## 2021-01-10 DIAGNOSIS — E1165 Type 2 diabetes mellitus with hyperglycemia: Secondary | ICD-10-CM

## 2021-01-10 DIAGNOSIS — I25119 Atherosclerotic heart disease of native coronary artery with unspecified angina pectoris: Secondary | ICD-10-CM | POA: Diagnosis not present

## 2021-01-10 DIAGNOSIS — I255 Ischemic cardiomyopathy: Secondary | ICD-10-CM | POA: Diagnosis not present

## 2021-01-10 DIAGNOSIS — Z789 Other specified health status: Secondary | ICD-10-CM | POA: Diagnosis not present

## 2021-01-10 DIAGNOSIS — Z87891 Personal history of nicotine dependence: Secondary | ICD-10-CM | POA: Diagnosis not present

## 2021-01-10 MED ORDER — FUROSEMIDE 20 MG PO TABS: 20.0000 mg | ORAL_TABLET | Freq: Every day | ORAL | 3 refills | Status: DC

## 2021-01-10 MED ORDER — NITROGLYCERIN 0.4 MG SL SUBL: SUBLINGUAL_TABLET | SUBLINGUAL | 3 refills | Status: DC

## 2021-01-10 MED ORDER — ISOSORBIDE MONONITRATE ER 120 MG PO TB24: 120.0000 mg | ORAL_TABLET | Freq: Every day | ORAL | 3 refills | Status: DC

## 2021-01-10 NOTE — H&P (View-Only) (Signed)
Office Visit    Patient Name: [name] [name] Date of Encounter: 01/10/2021  Primary Care Provider:  Venita Lick, NP Primary Cardiologist:  Kathlyn Sacramento, MD  Chief Complaint    Chief Complaint  Patient presents with  . Follow-up    Swelling and CP    40 year old female with history of CAD s/p PCI with last cath 01/2019 as below, DM 2, statin intolerance on Praluent, diabetic gastroparesis with chronic nausea and vomiting, ischemic cardiomyopathy with subsequent normalization of LVEF, CKD stage II-III, hypertension, hyperlipidemia, obesity, past tobacco use, and who presents today for follow-up and with report of increased swelling.  Past Medical History    Past Medical History:  Diagnosis Date  . CAD (coronary artery disease)    a. 03/2015 NSTEMI/PCI: LCX 166m (DES), OM1 100p (DES - vessel labeled Ramus in subsequent cath report). Minor irregs to LAD/RCA; b. 04/2015 Cath: LM nl, LAD 40p, RI patent stent, LCX patent stent, RCA nl, EF 55-65%; c. 02/2016 MV: basal inferolateral and mid inferolateral defect/infarct w/o ischemia. EF 55-65%; d. 11/2018 PCI: LM 30ost, LAD 30p, LCX 95p/m (PTCA->20%), OM1 10ost, OM2 40ost, RCA 85p.  Marland Kitchen Chronic abdominal pain   . Chronic diastolic (congestive) heart failure ([place])    a. 03/2015 Echo: EF 30-35%, mild concentric LVH, severe HK of inf, inflat, & lat walls, mod MR, mild TR;  b. 04/2015 LV Gram: EF 55-65%; c. 09/2017 Echo: EF 55-60%, no rwma. Mild MR; d. 11/2018 Echo: Ef 60-65%, no rwma.  . CKD (chronic kidney disease), stage III ([place])   . Diabetes type 2, controlled ([place])    a. since 2002   . Diabetic gastroparesis (H[facility])    a. chronic nausea/vomiting.  . Diverticulosis, sigmoid 07/2016  . Heavy menses    a. H/O IUD - expired in 2014 - remains in place.  Marland Kitchen Hx of migraines   . Hyperlipidemia    Intolerant of atorvastatin, rosuvastatin  . Hypertension   . Iron deficiency anemia   . Ischemic cardiomyopathy (resolved)    a. 03/2015 EF 30-35%  post NSTEMI;  b. 04/2015 EF 55-65% on LV gram; c. 09/2017 Echo: EF 55-60%; d. 11/2018 Echo: Ef 60-65%.  . Obesity   . Tobacco abuse    a. quit 03/2015.  Marland Kitchen Uterine fibroid   . Vertigo   . Vitamin D deficiency    Past Surgical History:  Procedure Laterality Date  . APPENDECTOMY    . CARDIAC CATHETERIZATION  4/16   x2 stent [facility]  . CARDIAC CATHETERIZATION N/A 05/05/2015   Procedure: Left Heart Cath and Coronary Angiography;  Surgeon: Peter M Martinique, MD;  Location: [place] CV LAB;  Service: Cardiovascular;  Laterality: N/A;  . COLONOSCOPY WITH PROPOFOL N/A 07/31/2016   Procedure: COLONOSCOPY WITH PROPOFOL;  Surgeon: Lollie Sails, MD;  Location: [facility] ENDOSCOPY;  Service: Endoscopy;  Laterality: N/A;  . CORONARY BALLOON ANGIOPLASTY N/A 12/04/2018   Procedure: CORONARY BALLOON ANGIOPLASTY;  Surgeon: Wellington Hampshire, MD;  Location: [place] CV LAB;  Service: Cardiovascular;  Laterality: N/A;  . CORONARY STENT INTERVENTION N/A 01/12/2019   Procedure: CORONARY STENT INTERVENTION;  Surgeon: [name], MD;  Location: [place] [facility] LAB;  Service: Cardiovascular;  Laterality: N/A;  . ESOPHAGOGASTRODUODENOSCOPY (EGD) WITH PROPOFOL N/A 07/31/2016   Procedure: ESOPHAGOGASTRODUODENOSCOPY (EGD) WITH PROPOFOL;  Surgeon: Lollie Sails, MD;  Location: [facility] ENDOSCOPY;  Service: Endoscopy;  Laterality: N/A;  . INTRAVASCULAR PRESSURE WIRE/FFR STUDY N/A 12/04/2018   Procedure: INTRAVASCULAR PRESSURE WIRE/FFR STUDY;  Surgeon:  Wellington Hampshire, MD;  Location: [place] CV LAB;  Service: Cardiovascular;  Laterality: N/A;  . LAPAROSCOPIC APPENDECTOMY N/A 08/07/2016   Procedure: APPENDECTOMY LAPAROSCOPIC;  Surgeon: Hubbard Robinson, MD;  Location: [facility] ORS;  Service: General;  Laterality: N/A;  . LEFT HEART CATH AND CORONARY ANGIOGRAPHY Left 12/04/2018   Procedure: LEFT HEART CATH AND CORONARY ANGIOGRAPHY;  Surgeon: Wellington Hampshire, MD;  Location: [place] CV LAB;  Service:  Cardiovascular;  Laterality: Left;  . LEFT HEART CATH AND CORONARY ANGIOGRAPHY N/A 01/12/2019   Procedure: LEFT HEART CATH AND CORONARY ANGIOGRAPHY;  Surgeon: [name], MD;  Location: [place] CV LAB;  Service: Cardiovascular;  Laterality: N/A;  . MOUTH SURGERY      Allergies  Allergies  Allergen Reactions  . Lisinopril Anaphylaxis, Itching and Swelling  . Other Itching and Swelling    Old bay seasoning Old bay seasoning Old bay seasoning Old bay seasoning Old bay seasoning Old bay seasoning Old bay seasoning Old bay seasoning Old bay seasoning   . Rosemary Oil Anaphylaxis  . Shellfish Allergy Itching and Swelling  . Metformin And Related Diarrhea, Nausea And Vomiting and Rash    History of Present Illness    [name] [name] is a 40 y.o. female with PMH as above.   Cardiac history dates back to April 2016, when she was hospitalized with chest pain and non-STEMI.  LVEF 30 to 35%.    She underwent LHC with severe LCx and OM1 dz.  Both lesions were successfully treated with DES.    04/2015 LHC revealed patency of both stents and normalization of LVEF.   02/2016 stress test was nonischemic and low risk.    She had recurrent angina 11/2018.   2019 LHC showed patent stent in OM1, focal restenosis in the LCx at the origin of OM 2, and a new significant stenosis in the p-mRCA.  Balloon angioplasty was performed to the LCx.  She underwent staged RCA PCI with DES.  She has history of intolerance to statins.  She is currently on Praluent.    She has CKD, followed by nephrology.  She was seen 11/21/2020 with high stress levels and intermittent atypical chest pain.  Large BP cuff was recommended.  Metoprolol was switched to carvedilol.  BP adjustment was favored over pursuing ischemic evaluation at the time.  She was last seen in office 12/22/2020.  She reported BP still elevated at times but overall improved.  She had not yet obtained her new BP cuff but estimated 1 would  arrive 01/11/2021.  Pressures with old cuff included BP that morning at 166/92 and 125/82 the previous evening.  She reported intermittent CP, though improved from previous visits.  CP described as brief twinge or tingling that occasionally radiated down the left arm.  It was not worsened with exertion and occasionally occurred during the evening hours.  She was taking the [facility] 3 times a week for hyperkalemia.  She had worsening edema over Christmas and had taken Lasix with improvement in swelling.  Today, 01/10/2021, she returns to clinic and notes CP that now feels similar to before her prior interventions, exertional, and occurring at greater frequency than previous visits despite improving BP.  CP is described as a tightness that occurs with exertion and at rest, though worse with exertion.  Associated symptoms include radiation to L arm or rather a tightness in her left arm. CP has increased in frequency and now occurs on and off all day. Each episode lasts anywhere  from 30 minutes to 1 hour. She reports 1 episode that lasted several hours recently, and for which she took an extra Imdur as her SL nitro is expired (to be refilled today) and with some noted improvement in her CP with the extra nitro. She states her BP was not elevated at that time. No associated racing HR, palpitations, dizziness, or LOC at that time. With each episode of CP, she denies any clear association with food, stressors, BP, HR, volume, position, lifting, or deeper breathing. She has noted increased DOE when her CP is made worse with exertion. Yesterday, she reports having CP on and off all day with longest episode at 1 hour while in bed but not waking her from sleep. Since around Thanksgiving of 2021, she has noted a slow increase in swelling/LEE and abdominal distention, despite the fact that she has not changed her diet or salt/fluid intake. This recent swelling, however, seems to have increased more with the change in her CP/longer  CP episode as described above. She denies orthopnea and reports using 2 pillows at night, mainly for comfort, and which is not increased from previous visits. No PND or early satiety. She has not yet received her new BP cuff (larger size) and is still using the old BP cuff but expects her new one 01/11/21. She reports, despite CP, BP continues to improve. Home SBP is usually 110s to 120/130s with rare elevation to 150 and usually not at the time of CP.  DBP has ranged from 70s -80s with rare elevation to 90s.  She is monitoring her weight and noted recent increase, as it usually runs 230 to 235 pounds but is recently at 240 pounds.  She has significantly decreased salt and overhauled her diet. She reports medication compliance though does admit she is not great at remembering to take her Praluent. She has not taken her lasix today, given she alternates taking lasix 40mg  every other day, and she took it yesterday. She reports adequate urine output on lasix.  She is more fatigued than usual, which is also similar to before her previous interventions. No signs or symptoms of bleeding.  She is still following with Dr. Candiss Norse / nephrology.  She also continues to follow with endocrinology at [facility] and regularly monitors her glucose (can monitor with phone).  Wt today 244lbs, up 7 lbs from 237lbs on 12/22/20 (7lbs in ~3 weeks). BP today 138/82, up from 120/80 on 12/22/20 (SBP incr of18, DBP incr of 2).  She has not taken lasix today ( due to schedule of lasix 40mg  every other day). She did take her lasix 40mg  on the 14th of January, however (and given qod dosing schedule).  Home Medications    Current Outpatient Medications on File Prior to Visit  Medication Sig Dispense Refill  . ACCU-CHEK AVIVA PLUS test strip USE 1 STRIP TO CHECK GLUCOSE THREE TIMES DAILY    . amitriptyline (ELAVIL) 25 MG tablet Take 25 mg by mouth at bedtime.    Marland Kitchen aspirin EC 81 MG tablet Take 81 mg by mouth daily.    . benzonatate (TESSALON) 100  MG capsule Take 1 capsule (100 mg total) by mouth 2 (two) times daily as needed for cough. 30 capsule 12  . carvedilol (COREG) 25 MG tablet Take 1 tablet (25 mg total) by mouth 2 (two) times daily. 180 tablet 3  . Cholecalciferol 2000 units TABS Take by mouth daily.     . clopidogrel (PLAVIX) 75 MG tablet Take 1 tablet (  75 mg total) by mouth daily. 90 tablet 3  . dapagliflozin propanediol (FARXIGA) 10 MG TABS tablet Take 10 mg by mouth daily.    . DULoxetine (CYMBALTA) 30 MG capsule Take 60 mg by mouth daily.   3  . etonogestrel (NEXPLANON) 68 MG IMPL implant 1 each by Subdermal route once.    . famotidine (PEPCID) 40 MG tablet Take 40 mg at bedtime    . fluticasone (FLONASE) 50 MCG/ACT nasal spray Place 2 sprays into both nostrils daily. 15.8 mL 2  . furosemide (LASIX) 40 MG tablet Take 1 tablet (40 mg total) by mouth every other day. 30 tablet 5  . glimepiride (AMARYL) 4 MG tablet Take 4 mg by mouth daily with breakfast.    . insulin aspart (NOVOLOG) 100 UNIT/ML injection Inject 10 Units into the skin 3 (three) times daily with meals. 10 mL 11  . Insulin Glargine (BASAGLAR KWIKPEN) 100 UNIT/ML SOPN Inject 0.3 mLs (30 Units total) into the skin daily. 1 pen 0  . irbesartan (AVAPRO) 75 MG tablet Take 1 tablet (75 mg total) by mouth daily. 90 tablet 3  . isosorbide mononitrate (IMDUR) 60 MG 24 hr tablet Take 1 tablet (60 mg total) by mouth daily. May take an additional half tablet as needed for elevated systolic blood pressure >735. 90 tablet 3  . LOKELMA 10 g PACK packet Take 10 g by mouth 3 (three) times a week.    . loperamide (IMODIUM) 2 MG capsule Take by mouth as needed.     . meclizine (ANTIVERT) 25 MG tablet Take 25 mg by mouth 3 (three) times daily as needed for dizziness.    . mirtazapine (REMERON) 30 MG tablet Take 2 tablets (60 mg total) by mouth at bedtime.    . nitroGLYCERIN (NITROSTAT) 0.4 MG SL tablet Take 1 tab under your tongue while sitting for chest pain, If no relief may  repeat, one tab every 5 min up to 3 tabs total over 15 mins. 25 tablet 3  . pantoprazole (PROTONIX) 40 MG tablet Take 1 tablet by mouth twice daily 180 tablet 0  . PRALUENT 75 MG/ML SOAJ Inject 75 mg into the skin every 14 (fourteen) days. 2 mL 11  . prochlorperazine (COMPAZINE) 5 MG tablet TAKE 1 TABLET BY MOUTH EVERY 6 HOURS AS NEEDED FOR NAUSEA AND VOMITING    . promethazine (PHENERGAN) 12.5 MG tablet TAKE 1 TABLET BY MOUTH EVERY 6 HOURS AS NEEDED FOR NAUSEA AND VOMITING 30 tablet 2  . metoCLOPramide (REGLAN) 10 MG tablet Take 1 tablet (10 mg total) by mouth 4 (four) times daily -  before meals and at bedtime. 120 tablet 0   No current facility-administered medications on file prior to visit.    Review of Systems    She denies palpitations, pnd, orthopnea (2 pillows, stable, used for comfort), n, v, dizziness, syncope, or early satiety.  She reports a change in her chest pain from that of previous visits as well as increased frequency.  CP is now described as a tightness and similar to that experienced before her previous interventions. It is made worse with exertion and associated with DOE/ shortness of breath when exerting herself. She also notes radiation with left arm tightness.  CP yesterday was on and off all day, lasting 30 minutes to 1 hour.  Previous episode (several days ago) lasted several hours with subsequent increased swelling and abdominal distention since then, as well as wt gain   She notes fatigue. All other systems  reviewed and are otherwise negative except as noted above.  Physical Exam    VS:  BP 138/82   Pulse 70   Ht 5\' 5"  (1.651 m)   Wt 244 lb (110.7 kg)   BMI 40.60 kg/m  , BMI Body mass index is 40.6 kg/m. GEN: Well nourished, well developed, in no acute distress. HEENT: normal. Neck: Supple, no JVD, carotid bruits, or masses. Cardiac: RRR, no murmurs, rubs, or gallops. No clubbing, cyanosis,1-2+ bilateral edema.  Radials/DP/PT 2+ and equal bilaterally.   Respiratory:  Respirations regular and unlabored, clear to auscultation bilaterally. GI: Soft, nontender, nondistended, BS + x 4. MS: no deformity or atrophy. Skin: warm and dry, no rash. Neuro:  Strength and sensation are intact. Psych: Normal affect.  Accessory Clinical Findings    ECG personally reviewed by me today -NSR, 70 bpm, repolarization changes are seen on prior EKGs, poor R wave progression V1 to V2- no acute changes from 11/21/20.  VITALS Reviewed today   Temp Readings from Last 3 Encounters:  10/11/20 98.5 F (36.9 C) (Oral)  10/08/20 98.7 F (37.1 C) (Oral)  11/25/19 98.6 F (37 C) (Oral)   BP Readings from Last 3 Encounters:  01/10/21 138/82  12/22/20 120/80  11/21/20 (!) 150/80   Pulse Readings from Last 3 Encounters:  01/10/21 70  12/22/20 72  11/21/20 65    Wt Readings from Last 3 Encounters:  01/10/21 244 lb (110.7 kg)  12/22/20 237 lb (107.5 kg)  11/21/20 243 lb (110.2 kg)     LABS  reviewed today   Lab Results  Component Value Date   WBC 7.6 10/11/2020   HGB 10.5 (L) 10/11/2020   HCT 34.0 (L) 10/11/2020   MCV 87.0 10/11/2020   PLT 438 (H) 10/11/2020   Lab Results  Component Value Date   CREATININE 1.61 (H) 10/11/2020   BUN 23 (H) 10/11/2020   NA 137 10/11/2020   K 5.2 (H) 10/11/2020   CL 109 10/11/2020   CO2 20 (L) 10/11/2020   Lab Results  Component Value Date   ALT 15 11/20/2019   AST 20 11/20/2019   GGT 487 (H) 06/03/2016   ALKPHOS 111 11/20/2019   BILITOT 0.7 11/20/2019   Lab Results  Component Value Date   CHOL 217 (H) 08/18/2019   HDL 47 08/18/2019   LDLCALC 138 (H) 08/18/2019   LDLDIRECT 163 (H) 12/17/2018   TRIG 177 (H) 08/18/2019   CHOLHDL 4.6 (H) 12/17/2018    Lab Results  Component Value Date   HGBA1C 9.0 12/15/2019   Lab Results  Component Value Date   TSH 1.005 07/22/2019     STUDIES/PROCEDURES reviewed today   Cardiac cath 01/2019 Left Main Ost LM lesion is 30% stenosed. Left Anterior  Descending Prox LAD lesion is 30% stenosed. Left Circumflex Prox Cx to Mid Cx lesion is 20% stenosed. The lesion is type C. The lesion was previously treated using a drug eluting stent . Previously placed stent displays restenosis. First Obtuse Marginal Branch Ost 1st Mrg to 1st Mrg lesion is 10% stenosed. The lesion was previously treated. Second Obtuse Marginal Branch Ost 2nd Mrg to 2nd Mrg lesion is 40% stenosed. Right Coronary Artery Prox RCA to Mid RCA lesion is 85% stenosed. Conclusions: 1. Stable appearance of coronary arteries since 12/04/2018, with 80-90% mid RCA disease and 20-30% in-stent restenosis in mid LCx. 2. Moderately to severely elevated left ventricular filling pressure. 3. Successful PCI to mid RCA using Synergy 2.5 x 32 mm drug-eluting stent (  postdilated to 2.8 mm) with 0% residual stenosis and TIMI-3 flow. Recommendations: 1. Overnight extended recovery. 2. Continue dual antiplatelet therapy with aspirin and clopidogrel (through at least 12/05/2019 but ideally longer). 3. Aggressive secondary prevention; plans already made to initiate PCSK9 inhibitor as an outpatient. 4. Defer post catheterization hydration in the setting of moderately to severely elevated LVEDP. If renal function tolerates, consider initiation of gentle diuresis in the morning.   Coronary Diagrams Diagnostic Dominance: Right    Intervention      Long term monitor 01/2019 14-day monitor:  Normal sinus rhythm with an average heart rate of 94 bpm. Intermittent sinus tachycardia. Triggered events by the patient did not correlate with significant arrhythmia. She was in sinus rhythm or sinus tachycardia during these events. Rare PVCs.  Echo 11/2018 Study Conclusions  - Left ventricle: The cavity size was normal. There was moderate  concentric hypertrophy. Systolic function was normal. The  estimated ejection fraction was in the range of 60% to 65%. Wall  motion was  normal; there were no regional wall motion  abnormalities. Left ventricular diastolic function parameters  were normal.  - Pulmonary arteries: Systolic pressure could not be accurately  estimated.   Assessment & Plan    Exertional Chest pain with L arm radiation, frequent History of CAD s/p PCI   --No current CP at time of visit. Today's EKG without acute ST/T changes. Previous 01/2019 cath with RCA angioplasty /stenting and noted restenosis of LCx, OM1 stent at 10%s. Severely incr. LVEDP at that time with diuresis complicated by CKD. Recent CP described as different from previous visits and similar to before her previous interventions. CP / tightness worse with exertion & L arm radiation. Associated DOE & fatigue. Given increased swelling/DOE with wt /BP up from last visit, we discussed that increased volume could be contributing to her CP and DOE. At least least mildly volume up on exam. Given RF for CAD, as well as CP increasing in frequency and now exertional and similar to before her interventions despite improved BP per pt, will discuss case with primary cardiologist.   --[facility] tentatively scheduled and pending discussion with Dr. Fletcher Anon, as well as reported pt response to the below medication changes. Consented for [facility] today in the event that we proceed with catheterization.  The risks [stroke (1 in 1000), death (1 in 110), kidney failure [usually temporary] (1 in 500), bleeding (1 in 200), allergic reaction [possibly serious] (1 in 200)], benefits (diagnostic support and management of coronary artery disease) and alternatives of a cardiac catheterization were discussed in detail with Ms. Lusk and she is willing to proceed if recommended by primary cardiologist. --Medications   Continued DAPT /ASA and Plavix with PPI.  Continued Coreg 25mg  BID.   Increase to Coreg 37.5mg  BID at RTC if elevated BP  /HR allows and for further antianginal effect.  Increased Imdur to 120mg  daily for  antianginal effect & BP support.  Refilled SL nitro to take PRN for CP - reviewed instructions for use.   Continued Praluent with consistency stressed for aggressive risk factor modification.  Suggested phone alarm to avoid missed doses in the future. --Daily wts / BP. Monitor fluid status and for s/sx of volume overload, as this can contribute to sx of CP/SOB.  Changed to lasix 20mg  daily for more even day to day diuresis.  --Labs: CBC, CMET, lipids, liver function.   AOC diastolic heart failure (Echo, 2019) Elevated LVEDP, severe by 01/2019 R/LHC --Reports new DOE, as well as  increasing LEE and abdominal distention without change in fluid or Na / diet. Severely elevated LVEDP by 01/2019 cath with diuresis complicated by CKD. Previous echo with moderate concentric hypertrophy and EF 60-65% in 2019. Reviewed that elevated volume can contribute to elevated BP and wt, as well as sx of CP and DOE. Also considered is at least some element of deconditioning as discussed today.  Considered is qod lasix dosing. She does appear at least mildly volume up today. Reviewed salt and fluid intake and reported as well controlled. Medication changes as below with tentative plan for [facility] / updating echo to be determined pending discussion with primary cardiologist. --Tentative plan for [facility] as above.    Obtain echo if defer RHC, as this will reassess EF/WM, heart structure / valves, chamber sizes / heart pressures. --Medications  Changed to lasix 20mg  daily (verus lasix 40mg  every other day) for more even diuresis.   Defer from increasing diuresis pending repeat BMET.  Continued Coreg 25mg  BID.  Consider dose increase at RTC if room in HR/BP as above.  Continued Irbesartan.    Continued Farxiga for GDMT. --Labs: Repeat BMET.   Hypertension, goal BP 130/80 or lower --BP noted to continue to improve on home cuff, though still awaiting larger BP cuff (will arrive 2/3). Continue to monitor at home with  goal 130/80 or lower. Reviewed recommendation for salt under 2g and fluid under 2L daily.  Continued Coreg 25mg  BID.   Increase dose at RTC if ongoing elevated BP / room in HR.   Continued irbesartan.   Increased Imdur to 120mg  daily.   Changed from lasix 40mg  qod to to lasix 20mg  daily for more even daily diuresis and hopefully less labile BP.   HLD, LDL goal below 70 Statin intolerance --Reports missed doses due to schedule of taking q2w. Strict LDL control recommended for aggressive risk factor modification. Suggested using phone alarm to help prevent missed dosing with pt agreeable. Recheck LFTs and lipids today. Continue lifestyle changes as discussed.   CKDII-III --As above, diuresis and repeat cath with contrast complicated by renal function. Followed closely by [facility] / Dr. Candiss Norse. Will discuss case and cath with primary cardiologist and repeat BMET today. Change to lasix 20mg  daily for more even diuresis and with hope that this results in less labile BP/wt.  BP and glycemic control recommended to prevent worsening renal function.    ---   Medication changes:    Change from lasix 40mg  qod to to lasix 20mg  daily.   Increase to Imdur 120mg  daily.  Refill SL nitro.  Labs ordered:  CBC, CMET, lipids, liver function.  Studies / Imaging ordered:   Tentative plan for [facility] pending discussion with Dr. Fletcher Anon. Future considerations:   Consider dose increase to Coreg 37.5mg  BID at RTC.   ?Echo? Pending decision for cath.  Reassess if taking Praluent regularly at RTC Disposition: RTC after cath or in 2-4 weeks    Arvil Chaco, PA-C 01/10/2021

## 2021-01-10 NOTE — Progress Notes (Incomplete)
Office Visit    Patient Name: [name] [name] Date of Encounter: 01/10/2021  Primary Care Provider:  Venita Lick, NP Primary Cardiologist:  Kathlyn Sacramento, MD  Chief Complaint    Chief Complaint  Patient presents with  . Follow-up    Swelling and CP    40 year old female with history of CAD, DM 2, statin intolerance on Praluent, diabetic gastroparesis with chronic nausea and vomiting, ischemic cardiomyopathy with subsequent normalization of LVEF, CKD stage II-III, hypertension, hyperlipidemia, obesity, and who presents today for follow-up and with report of left-sided swelling.  Past Medical History    Past Medical History:  Diagnosis Date  . CAD (coronary artery disease)    a. 03/2015 NSTEMI/PCI: LCX 129m (DES), OM1 100p (DES - vessel labeled Ramus in subsequent cath report). Minor irregs to LAD/RCA; b. 04/2015 Cath: LM nl, LAD 40p, RI patent stent, LCX patent stent, RCA nl, EF 55-65%; c. 02/2016 MV: basal inferolateral and mid inferolateral defect/infarct w/o ischemia. EF 55-65%; d. 11/2018 PCI: LM 30ost, LAD 30p, LCX 95p/m (PTCA->20%), OM1 10ost, OM2 40ost, RCA 85p.  Marland Kitchen Chronic abdominal pain   . Chronic diastolic (congestive) heart failure ([place])    a. 03/2015 Echo: EF 30-35%, mild concentric LVH, severe HK of inf, inflat, & lat walls, mod MR, mild TR;  b. 04/2015 LV Gram: EF 55-65%; c. 09/2017 Echo: EF 55-60%, no rwma. Mild MR; d. 11/2018 Echo: Ef 60-65%, no rwma.  . CKD (chronic kidney disease), stage III ([place])   . Diabetes type 2, controlled ([place])    a. since 2002   . Diabetic gastroparesis ([facility])    a. chronic nausea/vomiting.  . Diverticulosis, sigmoid 07/2016  . Heavy menses    a. H/O IUD - expired in 2014 - remains in place.  Marland Kitchen Hx of migraines   . Hyperlipidemia    Intolerant of atorvastatin, rosuvastatin  . Hypertension   . Iron deficiency anemia   . Ischemic cardiomyopathy (resolved)    a. 03/2015 EF 30-35% post NSTEMI;  b. 04/2015 EF 55-65% on LV gram; c. 09/2017  Echo: EF 55-60%; d. 11/2018 Echo: Ef 60-65%.  . Obesity   . Tobacco abuse    a. quit 03/2015.  Marland Kitchen Uterine fibroid   . Vertigo   . Vitamin D deficiency    Past Surgical History:  Procedure Laterality Date  . APPENDECTOMY    . CARDIAC CATHETERIZATION  4/16   x2 stent [facility]  . CARDIAC CATHETERIZATION N/A 05/05/2015   Procedure: Left Heart Cath and Coronary Angiography;  Surgeon: Peter M Martinique, MD;  Location: [place] [facility];  Service: Cardiovascular;  Laterality: N/A;  . COLONOSCOPY WITH PROPOFOL N/A 07/31/2016   Procedure: COLONOSCOPY WITH PROPOFOL;  Surgeon: Lollie Sails, MD;  Location: [facility] ENDOSCOPY;  Service: Endoscopy;  Laterality: N/A;  . CORONARY BALLOON ANGIOPLASTY N/A 12/04/2018   Procedure: CORONARY BALLOON ANGIOPLASTY;  Surgeon: Wellington Hampshire, MD;  Location: [place] CV LAB;  Service: Cardiovascular;  Laterality: N/A;  . CORONARY STENT INTERVENTION N/A 01/12/2019   Procedure: CORONARY STENT INTERVENTION;  Surgeon: [name], MD;  Location: [place] [facility] LAB;  Service: Cardiovascular;  Laterality: N/A;  . ESOPHAGOGASTRODUODENOSCOPY (EGD) WITH PROPOFOL N/A 07/31/2016   Procedure: ESOPHAGOGASTRODUODENOSCOPY (EGD) WITH PROPOFOL;  Surgeon: Lollie Sails, MD;  Location: [facility] ENDOSCOPY;  Service: Endoscopy;  Laterality: N/A;  . INTRAVASCULAR PRESSURE WIRE/FFR STUDY N/A 12/04/2018   Procedure: INTRAVASCULAR PRESSURE WIRE/FFR STUDY;  Surgeon: Wellington Hampshire, MD;  Location: [place] CV LAB;  Service: Cardiovascular;  Laterality: N/A;  . LAPAROSCOPIC APPENDECTOMY N/A 08/07/2016   Procedure: APPENDECTOMY LAPAROSCOPIC;  Surgeon: Hubbard Robinson, MD;  Location: [facility] ORS;  Service: General;  Laterality: N/A;  . LEFT HEART CATH AND CORONARY ANGIOGRAPHY Left 12/04/2018   Procedure: LEFT HEART CATH AND CORONARY ANGIOGRAPHY;  Surgeon: Wellington Hampshire, MD;  Location: [facility] CV LAB;  Service: Cardiovascular;  Laterality: Left;  . LEFT HEART CATH AND  CORONARY ANGIOGRAPHY N/A 01/12/2019   Procedure: LEFT HEART CATH AND CORONARY ANGIOGRAPHY;  Surgeon: [name], MD;  Location: [place] [facility] LAB;  Service: Cardiovascular;  Laterality: N/A;  . MOUTH SURGERY      Allergies  Allergies  Allergen Reactions  . Lisinopril Anaphylaxis, Itching and Swelling  . Other Itching and Swelling    [place] seasoning Old bay seasoning Old bay seasoning Old bay seasoning Old bay seasoning Old bay seasoning Old bay seasoning Old bay seasoning Old [place] seasoning   . Rosemary Oil Anaphylaxis  . Shellfish Allergy Itching and Swelling  . Metformin And Related Diarrhea, Nausea And Vomiting and Rash    History of Present Illness    [name] [name] is a 40 y.o. female with PMH as above.   Cardiac history dates back to April 2016, when she was hospitalized with chest pain and non-STEMI.  LVEF 30 to 35%.    She underwent LHC with severe LCx and OM1 dz.  Both lesions were successfully treated with DES.    04/2015 LHC revealed patency of both stents and normalization of LVEF.   02/2016 stress test was nonischemic and low risk.    She had recurrent angina 11/2018.   2019 LHC showed patent stent in OM1, focal restenosis in the LCx at the origin of OM 2, and a new significant stenosis in the p-mRCA.  Balloon angioplasty was performed to the LCx.  She underwent staged RCA PCI with DES.  She has history of intolerance to statins.  She is currently on Praluent.    She has CKD, followed by nephrology.  She was seen 11/21/2020 with high stress levels and intermittent atypical chest pain.  Large BP cuff was recommended.  Metoprolol was switched to carvedilol.  BP adjustment was favored over pursuing ischemic evaluation at the time.  She was last seen in office 12/22/2020.  She reported BP still elevated at times but overall improved.  She had not yet obtained her new BP cuff but estimated 1 would arrive 01/11/2021.  Pressures with old cuff included BP that  morning at 166/92 and 125/82 the previous evening.  She reported intermittent CP, though improved from previous visits.  CP described as brief twinge or tingling that occasionally radiated down the left arm.  It was not worsened with exertion and occasionally occurred during the evening hours.  She was taking the [facility] 3 times a week for hyperkalemia.  She had worsening edema over Christmas and had taken Lasix with improvement in swelling.  Today, 01/10/2021, she returns to clinic and notes CP that now feels similar to before her prior interventions, exertional, and occurring at greater frequency than previous visits despite improving BP.  CP is described as a tightness that occurs with exertion and at rest, though worse with exertion.  Associated symptoms include radiation to L arm or rather a tightness in her left arm. CP has increased in frequency and now occurs on and off all day. Each episode lasts anywhere from 30 minutes to 1 hour. She reports 1 episode that  lasted several hours recently, and for which she took an extra Imdur as her SL nitro is expired (to be refilled today) and with some noted improvement in her CP with the extra nitro. She states her BP was not elevated at that time. No associated racing HR, palpitations, dizziness, or LOC at that time. With each episode of CP, she denies any clear association with food, stressors, BP, HR, volume, position, lifting, or deeper breathing. She has noted increased DOE when her CP is made worse with exertion. Yesterday, she reports having CP on and off all day with longest episode at 1 hour while in bed but not waking her from sleep. Since around Thanksgiving of 2021, she has noted a slow increase in swelling/LEE and abdominal distention, despite the fact that she has not changed her diet or salt/fluid intake. This recent swelling, however, seems to have increased more with the change in her CP/longer CP episode as described above. She denies orthopnea and  reports using 2 pillows at night, mainly for comfort, and which is not increased from previous visits. No PND or early satiety. She has not yet received her new BP cuff (larger size) and is still using the old BP cuff but expects her new one 01/11/21. She reports, despite CP, BP continues to improve. Home SBP is usually 110s to 120/130s with rare elevation to 150 and usually not at the time of CP.  DBP has ranged from 70s -80s with rare elevation to 90s.  She is monitoring her weight and noted recent increase, as it usually runs 230 to 235 pounds but is recently at 240 pounds.  She has significantly decreased salt and overhauled her diet. She reports medication compliance though does admit she is not great at remembering to take her Praluent. She has not taken her lasix today, given she alternates taking lasix 40mg  every other day, and she took it yesterday. She reports adequate urine output on lasix.  She is more fatigued than usual, which is also similar to before her previous interventions. No signs or symptoms of bleeding.  She is still following with Dr. Candiss Norse / nephrology.  She also continues to follow with [facility]ocrinology at [facility] and regularly monitors her glucose (can monitor with phone).  Wt today 244lbs, up 1 lbs from 243lbs on 12/22/20. BP today 138/82, increased from clinic on 1/14 at 120/80.  She has not taken her lasix today (on every other day schedule -40mg  every other day), with recommendations for 20mg  daily going forward.  Note - She did take her lasix on the 14th, however, based on this every other day schedule.  Home Medications    Current Outpatient Medications on File Prior to Visit  Medication Sig Dispense Refill  . ACCU-CHEK AVIVA PLUS test strip USE 1 STRIP TO CHECK GLUCOSE THREE TIMES DAILY    . amitriptyline (ELAVIL) 25 MG tablet Take 25 mg by mouth at bedtime.    Marland Kitchen aspirin EC 81 MG tablet Take 81 mg by mouth daily.    . benzonatate (TESSALON) 100 MG capsule Take 1 capsule (100  mg total) by mouth 2 (two) times daily as needed for cough. 30 capsule 12  . carvedilol (COREG) 25 MG tablet Take 1 tablet (25 mg total) by mouth 2 (two) times daily. 180 tablet 3  . Cholecalciferol 2000 units TABS Take by mouth daily.     . clopidogrel (PLAVIX) 75 MG tablet Take 1 tablet (75 mg total) by mouth daily. 90 tablet 3  .  dapagliflozin propanediol (FARXIGA) 10 MG TABS tablet Take 10 mg by mouth daily.    . DULoxetine (CYMBALTA) 30 MG capsule Take 60 mg by mouth daily.   3  . etonogestrel (NEXPLANON) 68 MG IMPL implant 1 each by Subdermal route once.    . famotidine (PEPCID) 40 MG tablet Take 40 mg at bedtime    . fluticasone (FLONASE) 50 MCG/ACT nasal spray Place 2 sprays into both nostrils daily. 15.8 mL 2  . furosemide (LASIX) 40 MG tablet Take 1 tablet (40 mg total) by mouth every other day. 30 tablet 5  . glimepiride (AMARYL) 4 MG tablet Take 4 mg by mouth daily with breakfast.    . insulin aspart (NOVOLOG) 100 UNIT/ML injection Inject 10 Units into the skin 3 (three) times daily with meals. 10 mL 11  . Insulin Glargine (BASAGLAR KWIKPEN) 100 UNIT/ML SOPN Inject 0.3 mLs (30 Units total) into the skin daily. 1 pen 0  . irbesartan (AVAPRO) 75 MG tablet Take 1 tablet (75 mg total) by mouth daily. 90 tablet 3  . isosorbide mononitrate (IMDUR) 60 MG 24 hr tablet Take 1 tablet (60 mg total) by mouth daily. May take an additional half tablet as needed for elevated systolic blood pressure >732. 90 tablet 3  . LOKELMA 10 g PACK packet Take 10 g by mouth 3 (three) times a week.    . loperamide (IMODIUM) 2 MG capsule Take by mouth as needed.     . meclizine (ANTIVERT) 25 MG tablet Take 25 mg by mouth 3 (three) times daily as needed for dizziness.    . mirtazapine (REMERON) 30 MG tablet Take 2 tablets (60 mg total) by mouth at bedtime.    . nitroGLYCERIN (NITROSTAT) 0.4 MG SL tablet Take 1 tab under your tongue while sitting for chest pain, If no relief may repeat, one tab every 5 min up to 3  tabs total over 15 mins. 25 tablet 3  . pantoprazole (PROTONIX) 40 MG tablet Take 1 tablet by mouth twice daily 180 tablet 0  . PRALUENT 75 MG/ML SOAJ Inject 75 mg into the skin every 14 (fourteen) days. 2 mL 11  . prochlorperazine (COMPAZINE) 5 MG tablet TAKE 1 TABLET BY MOUTH EVERY 6 HOURS AS NEEDED FOR NAUSEA AND VOMITING    . promethazine (PHENERGAN) 12.5 MG tablet TAKE 1 TABLET BY MOUTH EVERY 6 HOURS AS NEEDED FOR NAUSEA AND VOMITING 30 tablet 2  . metoCLOPramide (REGLAN) 10 MG tablet Take 1 tablet (10 mg total) by mouth 4 (four) times daily -  before meals and at bedtime. 120 tablet 0   No current facility-administered medications on file prior to visit.    Review of Systems    She denies palpitations, pnd, orthopnea (2 pillows, stable, used for comfort), n, v, dizziness, syncope, or early satiety.  She reports a change in her chest pain from that of previous visits as well as increased frequency.  CP is now described as a tightness and similar to that experienced before her previous interventions. It is made worse with exertion and associated with DOE/ shortness of breath when exerting herself. She also notes radiation with left arm tightness.  CP yesterday was on and off all day, lasting 30 minutes to 1 hour.  Previous episode (several days ago) lasted several hours with subsequent increased swelling and abdominal distention since then, as well as wt gain   She notes fatigue. All other systems reviewed and are otherwise negative except as noted above.  Physical  Exam    VS:  BP 138/82   Pulse 70   Ht 5\' 5"  (1.651 m)   Wt 244 lb (110.7 kg)   BMI 40.60 kg/m  , BMI Body mass index is 40.6 kg/m. GEN: Well nourished, well developed, in no acute distress. HEENT: normal. Neck: Supple, no JVD, carotid bruits, or masses. Cardiac: RRR, no murmurs, rubs, or gallops. No clubbing, cyanosis, edema.  Radials/DP/PT 2+ and equal bilaterally.  Respiratory:  Respirations regular and unlabored, clear  to auscultation bilaterally. GI: Soft, nontender, nondistended, BS + x 4. MS: no deformity or atrophy. Skin: warm and dry, no rash. Neuro:  Strength and sensation are intact. Psych: Normal affect.  Accessory Clinical Findings    ECG personally reviewed by me today -NSR, 70 bpm, repolarization changes are seen on prior EKGs, poor R wave progression V1 to V2- no acute changes from 11/21/20.  VITALS Reviewed today   Temp Readings from Last 3 Encounters:  10/11/20 98.5 F (36.9 C) (Oral)  10/08/20 98.7 F (37.1 C) (Oral)  11/25/19 98.6 F (37 C) (Oral)   BP Readings from Last 3 Encounters:  01/10/21 138/82  12/22/20 120/80  11/21/20 (!) 150/80   Pulse Readings from Last 3 Encounters:  01/10/21 70  12/22/20 72  11/21/20 65    Wt Readings from Last 3 Encounters:  01/10/21 244 lb (110.7 kg)  12/22/20 237 lb (107.5 kg)  11/21/20 243 lb (110.2 kg)     LABS  reviewed today   Lab Results  Component Value Date   WBC 7.6 10/11/2020   HGB 10.5 (L) 10/11/2020   HCT 34.0 (L) 10/11/2020   MCV 87.0 10/11/2020   PLT 438 (H) 10/11/2020   Lab Results  Component Value Date   CREATININE 1.61 (H) 10/11/2020   BUN 23 (H) 10/11/2020   NA 137 10/11/2020   K 5.2 (H) 10/11/2020   CL 109 10/11/2020   CO2 20 (L) 10/11/2020   Lab Results  Component Value Date   ALT 15 11/20/2019   AST 20 11/20/2019   GGT 487 (H) 06/03/2016   ALKPHOS 111 11/20/2019   BILITOT 0.7 11/20/2019   Lab Results  Component Value Date   CHOL 217 (H) 08/18/2019   HDL 47 08/18/2019   LDLCALC 138 (H) 08/18/2019   LDLDIRECT 163 (H) 12/17/2018   TRIG 177 (H) 08/18/2019   CHOLHDL 4.6 (H) 12/17/2018    Lab Results  Component Value Date   HGBA1C 9.0 12/15/2019   Lab Results  Component Value Date   TSH 1.005 07/22/2019     STUDIES/PROCEDURES reviewed today   Cardiac cath 01/2019 Left Main Ost LM lesion is 30% stenosed. Left Anterior Descending Prox LAD lesion is 30% stenosed. Left  Circumflex Prox Cx to Mid Cx lesion is 20% stenosed. The lesion is type C. The lesion was previously treated using a drug eluting stent . Previously placed stent displays restenosis. First Obtuse Marginal Branch Ost 1st Mrg to 1st Mrg lesion is 10% stenosed. The lesion was previously treated. Second Obtuse Marginal Branch Ost 2nd Mrg to 2nd Mrg lesion is 40% stenosed. Right Coronary Artery Prox RCA to Mid RCA lesion is 85% stenosed. Conclusions: 1. Stable appearance of coronary arteries since 12/04/2018, with 80-90% mid RCA disease and 20-30% in-stent restenosis in mid LCx. 2. Moderately to severely elevated left ventricular filling pressure. 3. Successful PCI to mid RCA using Synergy 2.5 x 32 mm drug-eluting stent (postdilated to 2.8 mm) with 0% residual stenosis and TIMI-3 flow. Recommendations:  1. Overnight extended recovery. 2. Continue dual antiplatelet therapy with aspirin and clopidogrel (through at least 12/05/2019 but ideally longer). 3. Aggressive secondary prevention; plans already made to initiate PCSK9 inhibitor as an outpatient. 4. Defer post catheterization hydration in the setting of moderately to severely elevated LVEDP. If renal function tolerates, consider initiation of gentle diuresis in the morning.   Coronary Diagrams Diagnostic Dominance: Right    Intervention      Long term monitor 01/2019 14-day monitor:  Normal sinus rhythm with an average heart rate of 94 bpm. Intermittent sinus tachycardia. Triggered events by the patient did not correlate with significant arrhythmia. She was in sinus rhythm or sinus tachycardia during these events. Rare PVCs.  Echo 11/2018 Study Conclusions  - Left ventricle: The cavity size was normal. There was moderate  concentric hypertrophy. Systolic function was normal. The  estimated ejection fraction was in the range of 60% to 65%. Wall  motion was normal; there were no regional wall motion   abnormalities. Left ventricular diastolic function parameters  were normal.  - Pulmonary arteries: Systolic pressure could not be accurately  estimated.   Assessment & Plan    Exertional Chest pain/tightness with L arm radiation, frequent History of CAD s/p intervention to RCA, OM1, LCx Severely elevated LVEDP, complicated by CKD  --No current CP at time of visit. CP described as different from previous visits and similar to before her previous interventions. CP / tightness is worse with exertion with L arm radiation, as well as associated DOE and fatigue. Also notes increased swelling with recommendations as below. Last cath 01/2019 with angioplasty and stenting of RCA, restenosis noted of LCx stent, and previously treated OM1 10%s. She was started on Praluent, which she reports she forgets to take due to dosing every 2 weeks and recommendations as below. Despite improvement in home BP, CP continues to bother her at an increasing frequency. As below, LEE noted but otherwise euvolemic and with consideration that she did not take her lasix today due to dosing every other day. EKG today without acute ST/T changes. Given her change in CP character and frequency despite improved BP, but with known CKD, will discuss [facility] with primary cardiologist. Increased to Imdur 120mg  daily for now and refilled her SL nitro. As below, lasix also changed to 20mg  daily for more even diuresis, which may also help to alleviate her CP and DOE. She will keep [place] posted regarding her symptoms with these medication changes. Obtain CBC, BMET, lipids, liver function.   Chronic diastolic heart failure --Reports new DOE, as well as increasing LEE and abdominal distention without change in fluid or Na / diet. Wt increased 1 lb from previous visit with BP elevated, though with consideration that she takes lasix every other day and did not take a lasix today (but did at her last visit on 1/14).Marland Kitchen Known elevated LVEDP by 2020  cath. Volume status difficult to assess 2/2 body habitus with LEE apprev. As in past, diuresis is complicated by renal function. Recommend repeat RHC to reassess heart pressures and pt hemodynamics given worsening sx since last visit.  Recommend changing from lasix 40mg  every other day to lasix 20mg  daily for more even output / diuresis each day, given BP / wt appears improved on clinic on days she takes her lasix (ie last visit 1/14) versus those without lasix (ie today). Further recommendations pending repeat BMET if indicated and will reach out to nephrology if needed pending those labs. In the meantime, continue to  monitor salt and fluid intake, BP, daily wts.   Hypertension --BP noted to continue to improve on home cuff, though still awaiting larger BP cuff (will arrive 2/3). Continue current Coreg, irbesartan, Imdur. As above, will change to lasix 20mg  daily (rather than 40mg  every other day) with further recommendations if indicated pending repeat BMET.   Medication changes: *** Labs ordered: *** Studies / Imaging ordered: *** Future considerations: *** Disposition: ***  Total time spent with patient today *** minutes. This includes reviewing records, evaluating the patient, and coordinating care. Face-to-face time >50%.    Arvil Chaco, PA-C 01/10/2021

## 2021-01-10 NOTE — Progress Notes (Signed)
Office Visit    Patient Name: [name] [name] Date of Encounter: 01/10/2021  Primary Care Provider:  Venita Lick, NP Primary Cardiologist:  Kathlyn Sacramento, MD  Chief Complaint    Chief Complaint  Patient presents with  . Follow-up    Swelling and CP    40 year old female with history of CAD s/p PCI with last cath 01/2019 as below, DM 2, statin intolerance on Praluent, diabetic gastroparesis with chronic nausea and vomiting, ischemic cardiomyopathy with subsequent normalization of LVEF, CKD stage II-III, hypertension, hyperlipidemia, obesity, past tobacco use, and who presents today for follow-up and with report of increased swelling.  Past Medical History    Past Medical History:  Diagnosis Date  . CAD (coronary artery disease)    a. 03/2015 NSTEMI/PCI: LCX 125m (DES), OM1 100p (DES - vessel labeled Ramus in subsequent cath report). Minor irregs to LAD/RCA; b. 04/2015 Cath: LM nl, LAD 40p, RI patent stent, LCX patent stent, RCA nl, EF 55-65%; c. 02/2016 MV: basal inferolateral and mid inferolateral defect/infarct w/o ischemia. EF 55-65%; d. 11/2018 PCI: LM 30ost, LAD 30p, LCX 95p/m (PTCA->20%), OM1 10ost, OM2 40ost, RCA 85p.  Marland Kitchen Chronic abdominal pain   . Chronic diastolic (congestive) heart failure ([place])    a. 03/2015 Echo: EF 30-35%, mild concentric LVH, severe HK of inf, inflat, & lat walls, mod MR, mild TR;  b. 04/2015 LV Gram: EF 55-65%; c. 09/2017 Echo: EF 55-60%, no rwma. Mild MR; d. 11/2018 Echo: Ef 60-65%, no rwma.  . CKD (chronic kidney disease), stage III ([place])   . Diabetes type 2, controlled ([place])    a. since 2002   . Diabetic gastroparesis ([facility])    a. chronic nausea/vomiting.  . Diverticulosis, sigmoid 07/2016  . Heavy menses    a. H/O IUD - expired in 2014 - remains in place.  Marland Kitchen Hx of migraines   . Hyperlipidemia    Intolerant of atorvastatin, rosuvastatin  . Hypertension   . Iron deficiency anemia   . Ischemic cardiomyopathy (resolved)    a. 03/2015 EF 30-35%  post NSTEMI;  b. 04/2015 EF 55-65% on LV gram; c. 09/2017 Echo: EF 55-60%; d. 11/2018 Echo: Ef 60-65%.  . Obesity   . Tobacco abuse    a. quit 03/2015.  Marland Kitchen Uterine fibroid   . Vertigo   . Vitamin D deficiency    Past Surgical History:  Procedure Laterality Date  . APPENDECTOMY    . CARDIAC CATHETERIZATION  4/16   x2 stent [facility]  . CARDIAC CATHETERIZATION N/A 05/05/2015   Procedure: Left Heart Cath and Coronary Angiography;  Surgeon: Peter M Martinique, MD;  Location: [place] [facility];  Service: Cardiovascular;  Laterality: N/A;  . COLONOSCOPY WITH PROPOFOL N/A 07/31/2016   Procedure: COLONOSCOPY WITH PROPOFOL;  Surgeon: Lollie Sails, MD;  Location: [facility] ENDOSCOPY;  Service: Endoscopy;  Laterality: N/A;  . CORONARY BALLOON ANGIOPLASTY N/A 12/04/2018   Procedure: CORONARY BALLOON ANGIOPLASTY;  Surgeon: Wellington Hampshire, MD;  Location: [place] [facility];  Service: Cardiovascular;  Laterality: N/A;  . CORONARY STENT INTERVENTION N/A 01/12/2019   Procedure: CORONARY STENT INTERVENTION;  Surgeon: [name], MD;  Location: [place] [facility];  Service: Cardiovascular;  Laterality: N/A;  . ESOPHAGOGASTRODUODENOSCOPY (EGD) WITH PROPOFOL N/A 07/31/2016   Procedure: ESOPHAGOGASTRODUODENOSCOPY (EGD) WITH PROPOFOL;  Surgeon: Lollie Sails, MD;  Location: [facility] ENDOSCOPY;  Service: Endoscopy;  Laterality: N/A;  . INTRAVASCULAR PRESSURE WIRE/FFR STUDY N/A 12/04/2018   Procedure: INTRAVASCULAR PRESSURE WIRE/FFR STUDY;  Surgeon:  Wellington Hampshire, MD;  Location: [place] [facility];  Service: Cardiovascular;  Laterality: N/A;  . LAPAROSCOPIC APPENDECTOMY N/A 08/07/2016   Procedure: APPENDECTOMY LAPAROSCOPIC;  Surgeon: Hubbard Robinson, MD;  Location: [facility] ORS;  Service: General;  Laterality: N/A;  . LEFT HEART CATH AND CORONARY ANGIOGRAPHY Left 12/04/2018   Procedure: LEFT HEART CATH AND CORONARY ANGIOGRAPHY;  Surgeon: Wellington Hampshire, MD;  Location: [place] [facility];  Service:  Cardiovascular;  Laterality: Left;  . LEFT HEART CATH AND CORONARY ANGIOGRAPHY N/A 01/12/2019   Procedure: LEFT HEART CATH AND CORONARY ANGIOGRAPHY;  Surgeon: [name], MD;  Location: [place] [facility];  Service: Cardiovascular;  Laterality: N/A;  . MOUTH SURGERY      Allergies  Allergies  Allergen Reactions  . Lisinopril Anaphylaxis, Itching and Swelling  . Other Itching and Swelling    [place] seasoning Old bay seasoning Old bay seasoning Old bay seasoning Old bay seasoning Old bay seasoning Old bay seasoning Old bay seasoning Old bay seasoning   . Rosemary Oil Anaphylaxis  . Shellfish Allergy Itching and Swelling  . Metformin And Related Diarrhea, Nausea And Vomiting and Rash    History of Present Illness    [name] [name] is a 40 y.o. female with PMH as above.   Cardiac history dates back to April 2016, when she was hospitalized with chest pain and non-STEMI.  LVEF 30 to 35%.    She underwent LHC with severe LCx and OM1 dz.  Both lesions were successfully treated with DES.    04/2015 LHC revealed patency of both stents and normalization of LVEF.   02/2016 stress test was nonischemic and low risk.    She had recurrent angina 11/2018.   2019 LHC showed patent stent in OM1, focal restenosis in the LCx at the origin of OM 2, and a new significant stenosis in the p-mRCA.  Balloon angioplasty was performed to the LCx.  She underwent staged RCA PCI with DES.  She has history of intolerance to statins.  She is currently on Praluent.    She has CKD, followed by nephrology.  She was seen 11/21/2020 with high stress levels and intermittent atypical chest pain.  Large BP cuff was recommended.  Metoprolol was switched to carvedilol.  BP adjustment was favored over pursuing ischemic evaluation at the time.  She was last seen in office 12/22/2020.  She reported BP still elevated at times but overall improved.  She had not yet obtained her new BP cuff but estimated 1 would  arrive 01/11/2021.  Pressures with old cuff included BP that morning at 166/92 and 125/82 the previous evening.  She reported intermittent CP, though improved from previous visits.  CP described as brief twinge or tingling that occasionally radiated down the left arm.  It was not worsened with exertion and occasionally occurred during the evening hours.  She was taking the [facility] 3 times a week for hyperkalemia.  She had worsening edema over Christmas and had taken Lasix with improvement in swelling.  Today, 01/10/2021, she returns to clinic and notes CP that now feels similar to before her prior interventions, exertional, and occurring at greater frequency than previous visits despite improving BP.  CP is described as a tightness that occurs with exertion and at rest, though worse with exertion.  Associated symptoms include radiation to L arm or rather a tightness in her left arm. CP has increased in frequency and now occurs on and off all day. Each episode lasts anywhere  from 30 minutes to 1 hour. She reports 1 episode that lasted several hours recently, and for which she took an extra Imdur as her SL nitro is expired (to be refilled today) and with some noted improvement in her CP with the extra nitro. She states her BP was not elevated at that time. No associated racing HR, palpitations, dizziness, or LOC at that time. With each episode of CP, she denies any clear association with food, stressors, BP, HR, volume, position, lifting, or deeper breathing. She has noted increased DOE when her CP is made worse with exertion. Yesterday, she reports having CP on and off all day with longest episode at 1 hour while in bed but not waking her from sleep. Since around Thanksgiving of 2021, she has noted a slow increase in swelling/LEE and abdominal distention, despite the fact that she has not changed her diet or salt/fluid intake. This recent swelling, however, seems to have increased more with the change in her CP/longer  CP episode as described above. She denies orthopnea and reports using 2 pillows at night, mainly for comfort, and which is not increased from previous visits. No PND or early satiety. She has not yet received her new BP cuff (larger size) and is still using the old BP cuff but expects her new one 01/11/21. She reports, despite CP, BP continues to improve. Home SBP is usually 110s to 120/130s with rare elevation to 150 and usually not at the time of CP.  DBP has ranged from 70s -80s with rare elevation to 90s.  She is monitoring her weight and noted recent increase, as it usually runs 230 to 235 pounds but is recently at 240 pounds.  She has significantly decreased salt and overhauled her diet. She reports medication compliance though does admit she is not great at remembering to take her Praluent. She has not taken her lasix today, given she alternates taking lasix 40mg  every other day, and she took it yesterday. She reports adequate urine output on lasix.  She is more fatigued than usual, which is also similar to before her previous interventions. No signs or symptoms of bleeding.  She is still following with Dr. Candiss Norse / nephrology.  She also continues to follow with endocrinology at [facility] and regularly monitors her glucose (can monitor with phone).  Wt today 244lbs, up 7 lbs from 237lbs on 12/22/20 (7lbs in ~3 weeks). BP today 138/82, up from 120/80 on 12/22/20 (SBP incr of18, DBP incr of 2).  She has not taken lasix today ( due to schedule of lasix 40mg  every other day). She did take her lasix 40mg  on the 14th of January, however (and given qod dosing schedule).  Home Medications    Current Outpatient Medications on File Prior to Visit  Medication Sig Dispense Refill  . ACCU-CHEK AVIVA PLUS test strip USE 1 STRIP TO CHECK GLUCOSE THREE TIMES DAILY    . amitriptyline (ELAVIL) 25 MG tablet Take 25 mg by mouth at bedtime.    Marland Kitchen aspirin EC 81 MG tablet Take 81 mg by mouth daily.    . benzonatate (TESSALON) 100  MG capsule Take 1 capsule (100 mg total) by mouth 2 (two) times daily as needed for cough. 30 capsule 12  . carvedilol (COREG) 25 MG tablet Take 1 tablet (25 mg total) by mouth 2 (two) times daily. 180 tablet 3  . Cholecalciferol 2000 units TABS Take by mouth daily.     . clopidogrel (PLAVIX) 75 MG tablet Take 1 tablet (  75 mg total) by mouth daily. 90 tablet 3  . dapagliflozin propanediol (FARXIGA) 10 MG TABS tablet Take 10 mg by mouth daily.    . DULoxetine (CYMBALTA) 30 MG capsule Take 60 mg by mouth daily.   3  . etonogestrel (NEXPLANON) 68 MG IMPL implant 1 each by Subdermal route once.    . famotidine (PEPCID) 40 MG tablet Take 40 mg at bedtime    . fluticasone (FLONASE) 50 MCG/ACT nasal spray Place 2 sprays into both nostrils daily. 15.8 mL 2  . furosemide (LASIX) 40 MG tablet Take 1 tablet (40 mg total) by mouth every other day. 30 tablet 5  . glimepiride (AMARYL) 4 MG tablet Take 4 mg by mouth daily with breakfast.    . insulin aspart (NOVOLOG) 100 UNIT/ML injection Inject 10 Units into the skin 3 (three) times daily with meals. 10 mL 11  . Insulin Glargine (BASAGLAR KWIKPEN) 100 UNIT/ML SOPN Inject 0.3 mLs (30 Units total) into the skin daily. 1 pen 0  . irbesartan (AVAPRO) 75 MG tablet Take 1 tablet (75 mg total) by mouth daily. 90 tablet 3  . isosorbide mononitrate (IMDUR) 60 MG 24 hr tablet Take 1 tablet (60 mg total) by mouth daily. May take an additional half tablet as needed for elevated systolic blood pressure >841. 90 tablet 3  . LOKELMA 10 g PACK packet Take 10 g by mouth 3 (three) times a week.    . loperamide (IMODIUM) 2 MG capsule Take by mouth as needed.     . meclizine (ANTIVERT) 25 MG tablet Take 25 mg by mouth 3 (three) times daily as needed for dizziness.    . mirtazapine (REMERON) 30 MG tablet Take 2 tablets (60 mg total) by mouth at bedtime.    . nitroGLYCERIN (NITROSTAT) 0.4 MG SL tablet Take 1 tab under your tongue while sitting for chest pain, If no relief may  repeat, one tab every 5 min up to 3 tabs total over 15 mins. 25 tablet 3  . pantoprazole (PROTONIX) 40 MG tablet Take 1 tablet by mouth twice daily 180 tablet 0  . PRALUENT 75 MG/ML SOAJ Inject 75 mg into the skin every 14 (fourteen) days. 2 mL 11  . prochlorperazine (COMPAZINE) 5 MG tablet TAKE 1 TABLET BY MOUTH EVERY 6 HOURS AS NEEDED FOR NAUSEA AND VOMITING    . promethazine (PHENERGAN) 12.5 MG tablet TAKE 1 TABLET BY MOUTH EVERY 6 HOURS AS NEEDED FOR NAUSEA AND VOMITING 30 tablet 2  . metoCLOPramide (REGLAN) 10 MG tablet Take 1 tablet (10 mg total) by mouth 4 (four) times daily -  before meals and at bedtime. 120 tablet 0   No current facility-administered medications on file prior to visit.    Review of Systems    She denies palpitations, pnd, orthopnea (2 pillows, stable, used for comfort), n, v, dizziness, syncope, or early satiety.  She reports a change in her chest pain from that of previous visits as well as increased frequency.  CP is now described as a tightness and similar to that experienced before her previous interventions. It is made worse with exertion and associated with DOE/ shortness of breath when exerting herself. She also notes radiation with left arm tightness.  CP yesterday was on and off all day, lasting 30 minutes to 1 hour.  Previous episode (several days ago) lasted several hours with subsequent increased swelling and abdominal distention since then, as well as wt gain   She notes fatigue. All other systems  reviewed and are otherwise negative except as noted above.  Physical Exam    VS:  BP 138/82   Pulse 70   Ht 5\' 5"  (1.651 m)   Wt 244 lb (110.7 kg)   BMI 40.60 kg/m  , BMI Body mass index is 40.6 kg/m. GEN: Well nourished, well developed, in no acute distress. HEENT: normal. Neck: Supple, no JVD, carotid bruits, or masses. Cardiac: RRR, no murmurs, rubs, or gallops. No clubbing, cyanosis,1-2+ bilateral edema.  Radials/DP/PT 2+ and equal bilaterally.   Respiratory:  Respirations regular and unlabored, clear to auscultation bilaterally. GI: Soft, nontender, nondistended, BS + x 4. MS: no deformity or atrophy. Skin: warm and dry, no rash. Neuro:  Strength and sensation are intact. Psych: Normal affect.  Accessory Clinical Findings    ECG personally reviewed by me today -NSR, 70 bpm, repolarization changes are seen on prior EKGs, poor R wave progression V1 to V2- no acute changes from 11/21/20.  VITALS Reviewed today   Temp Readings from Last 3 Encounters:  10/11/20 98.5 F (36.9 C) (Oral)  10/08/20 98.7 F (37.1 C) (Oral)  11/25/19 98.6 F (37 C) (Oral)   BP Readings from Last 3 Encounters:  01/10/21 138/82  12/22/20 120/80  11/21/20 (!) 150/80   Pulse Readings from Last 3 Encounters:  01/10/21 70  12/22/20 72  11/21/20 65    Wt Readings from Last 3 Encounters:  01/10/21 244 lb (110.7 kg)  12/22/20 237 lb (107.5 kg)  11/21/20 243 lb (110.2 kg)     LABS  reviewed today   Lab Results  Component Value Date   WBC 7.6 10/11/2020   HGB 10.5 (L) 10/11/2020   HCT 34.0 (L) 10/11/2020   MCV 87.0 10/11/2020   PLT 438 (H) 10/11/2020   Lab Results  Component Value Date   CREATININE 1.61 (H) 10/11/2020   BUN 23 (H) 10/11/2020   NA 137 10/11/2020   K 5.2 (H) 10/11/2020   CL 109 10/11/2020   CO2 20 (L) 10/11/2020   Lab Results  Component Value Date   ALT 15 11/20/2019   AST 20 11/20/2019   GGT 487 (H) 06/03/2016   ALKPHOS 111 11/20/2019   BILITOT 0.7 11/20/2019   Lab Results  Component Value Date   CHOL 217 (H) 08/18/2019   HDL 47 08/18/2019   LDLCALC 138 (H) 08/18/2019   LDLDIRECT 163 (H) 12/17/2018   TRIG 177 (H) 08/18/2019   CHOLHDL 4.6 (H) 12/17/2018    Lab Results  Component Value Date   HGBA1C 9.0 12/15/2019   Lab Results  Component Value Date   TSH 1.005 07/22/2019     STUDIES/PROCEDURES reviewed today   Cardiac cath 01/2019 Left Main Ost LM lesion is 30% stenosed. Left Anterior  Descending Prox LAD lesion is 30% stenosed. Left Circumflex Prox Cx to Mid Cx lesion is 20% stenosed. The lesion is type C. The lesion was previously treated using a drug eluting stent . Previously placed stent displays restenosis. First Obtuse Marginal Branch Ost 1st Mrg to 1st Mrg lesion is 10% stenosed. The lesion was previously treated. Second Obtuse Marginal Branch Ost 2nd Mrg to 2nd Mrg lesion is 40% stenosed. Right Coronary Artery Prox RCA to Mid RCA lesion is 85% stenosed. Conclusions: 1. Stable appearance of coronary arteries since 12/04/2018, with 80-90% mid RCA disease and 20-30% in-stent restenosis in mid LCx. 2. Moderately to severely elevated left ventricular filling pressure. 3. Successful PCI to mid RCA using Synergy 2.5 x 32 mm drug-eluting stent (  postdilated to 2.8 mm) with 0% residual stenosis and TIMI-3 flow. Recommendations: 1. Overnight extended recovery. 2. Continue dual antiplatelet therapy with aspirin and clopidogrel (through at least 12/05/2019 but ideally longer). 3. Aggressive secondary prevention; plans already made to initiate PCSK9 inhibitor as an outpatient. 4. Defer post catheterization hydration in the setting of moderately to severely elevated LVEDP. If renal function tolerates, consider initiation of gentle diuresis in the morning.   Coronary Diagrams Diagnostic Dominance: Right    Intervention      Long term monitor 01/2019 14-day monitor:  Normal sinus rhythm with an average heart rate of 94 bpm. Intermittent sinus tachycardia. Triggered events by the patient did not correlate with significant arrhythmia. She was in sinus rhythm or sinus tachycardia during these events. Rare PVCs.  Echo 11/2018 Study Conclusions  - Left ventricle: The cavity size was normal. There was moderate  concentric hypertrophy. Systolic function was normal. The  estimated ejection fraction was in the range of 60% to 65%. Wall  motion was  normal; there were no regional wall motion  abnormalities. Left ventricular diastolic function parameters  were normal.  - Pulmonary arteries: Systolic pressure could not be accurately  estimated.   Assessment & Plan    Exertional Chest pain with L arm radiation, frequent History of CAD s/p PCI   --No current CP at time of visit. Today's EKG without acute ST/T changes. Previous 01/2019 cath with RCA angioplasty /stenting and noted restenosis of LCx, OM1 stent at 10%s. Severely incr. LVEDP at that time with diuresis complicated by CKD. Recent CP described as different from previous visits and similar to before her previous interventions. CP / tightness worse with exertion & L arm radiation. Associated DOE & fatigue. Given increased swelling/DOE with wt /BP up from last visit, we discussed that increased volume could be contributing to her CP and DOE. At least least mildly volume up on exam. Given RF for CAD, as well as CP increasing in frequency and now exertional and similar to before her interventions despite improved BP per pt, will discuss case with primary cardiologist.   --[facility] tentatively scheduled and pending discussion with Dr. Fletcher Anon, as well as reported pt response to the below medication changes. Consented for [facility] today in the event that we proceed with catheterization.  The risks [stroke (1 in 1000), death (1 in 2), kidney failure [usually temporary] (1 in 500), bleeding (1 in 200), allergic reaction [possibly serious] (1 in 200)], benefits (diagnostic support and management of coronary artery disease) and alternatives of a cardiac catheterization were discussed in detail with Ms. Issac and she is willing to proceed if recommended by primary cardiologist. --Medications   Continued DAPT /ASA and Plavix with PPI.  Continued Coreg 25mg  BID.   Increase to Coreg 37.5mg  BID at RTC if elevated BP  /HR allows and for further antianginal effect.  Increased Imdur to 120mg  daily for  antianginal effect & BP support.  Refilled SL nitro to take PRN for CP - reviewed instructions for use.   Continued Praluent with consistency stressed for aggressive risk factor modification.  Suggested phone alarm to avoid missed doses in the future. --Daily wts / BP. Monitor fluid status and for s/sx of volume overload, as this can contribute to sx of CP/SOB.  Changed to lasix 20mg  daily for more even day to day diuresis.  --Labs: CBC, CMET, lipids, liver function.   AOC diastolic heart failure (Echo, 2019) Elevated LVEDP, severe by 01/2019 R/LHC --Reports new DOE, as well as  increasing LEE and abdominal distention without change in fluid or Na / diet. Severely elevated LVEDP by 01/2019 cath with diuresis complicated by CKD. Previous echo with moderate concentric hypertrophy and EF 60-65% in 2019. Reviewed that elevated volume can contribute to elevated BP and wt, as well as sx of CP and DOE. Also considered is at least some element of deconditioning as discussed today.  Considered is qod lasix dosing. She does appear at least mildly volume up today. Reviewed salt and fluid intake and reported as well controlled. Medication changes as below with tentative plan for [facility] / updating echo to be determined pending discussion with primary cardiologist. --Tentative plan for [facility] as above.    Obtain echo if defer RHC, as this will reassess EF/WM, heart structure / valves, chamber sizes / heart pressures. --Medications  Changed to lasix 20mg  daily (verus lasix 40mg  every other day) for more even diuresis.   Defer from increasing diuresis pending repeat BMET.  Continued Coreg 25mg  BID.  Consider dose increase at RTC if room in HR/BP as above.  Continued Irbesartan.    Continued Farxiga for GDMT. --Labs: Repeat BMET.   Hypertension, goal BP 130/80 or lower --BP noted to continue to improve on home cuff, though still awaiting larger BP cuff (will arrive 2/3). Continue to monitor at home with  goal 130/80 or lower. Reviewed recommendation for salt under 2g and fluid under 2L daily.  Continued Coreg 25mg  BID.   Increase dose at RTC if ongoing elevated BP / room in HR.   Continued irbesartan.   Increased Imdur to 120mg  daily.   Changed from lasix 40mg  qod to to lasix 20mg  daily for more even daily diuresis and hopefully less labile BP.   HLD, LDL goal below 70 Statin intolerance --Reports missed doses due to schedule of taking q2w. Strict LDL control recommended for aggressive risk factor modification. Suggested using phone alarm to help prevent missed dosing with pt agreeable. Recheck LFTs and lipids today. Continue lifestyle changes as discussed.   CKDII-III --As above, diuresis and repeat cath with contrast complicated by renal function. Followed closely by [facility] / Dr. Candiss Norse. Will discuss case and cath with primary cardiologist and repeat BMET today. Change to lasix 20mg  daily for more even diuresis and with hope that this results in less labile BP/wt.  BP and glycemic control recommended to prevent worsening renal function.    ---   Medication changes:    Change from lasix 40mg  qod to to lasix 20mg  daily.   Increase to Imdur 120mg  daily.  Refill SL nitro.  Labs ordered:  CBC, CMET, lipids, liver function.  Studies / Imaging ordered:   Tentative plan for [facility] pending discussion with Dr. Fletcher Anon. Future considerations:   Consider dose increase to Coreg 37.5mg  BID at RTC.   ?Echo? Pending decision for cath.  Reassess if taking Praluent regularly at RTC Disposition: RTC after cath or in 2-4 weeks    Arvil Chaco, PA-C 01/10/2021

## 2021-01-10 NOTE — Patient Instructions (Signed)
Medication Instructions:   Your physician has recommended you make the following change in your medication:   INCREASE Imdur to 120mg  daily   CHANGE Furosemide (Lasix) to 20mg  daily (You may cut your current dose in half until you pick up new Rx)  Refills were sent for your Nitroglycerin  *If you need a refill on your cardiac medications before your next appointment, please call your pharmacy*   Lab Work: Your physician recommends that you have lab work TODAY: Cmet, CBC  If you have labs (blood work) drawn today and your tests are completely normal, you will receive your results only by: Marland Kitchen MyChart Message (if you have MyChart) OR . A paper copy in the mail If you have any lab test that is abnormal or we need to change your treatment, we will call you to review the results.   Testing/Procedures:  You are scheduled for a Cardiac Catheterization on Monday, February 21 with Dr. Kathlyn Sacramento.  1. Please arrive at the [place] [facility] at 8:30 AM (This time is one hour before your procedure to ensure your preparation). Free valet parking service is available.   2. Diet: Do not eat solid foods after midnight.  The patient may have clear liquids until 5am upon the day of the procedure.  3. Labs: We will draw your lab work needed today for procedure.  4. Medication instructions in preparation for your procedure:   Contrast Allergy: No  DO NOT TAKE THE MORNING OF PROCEDURE: 1) Furosemide (Lasix) - (Any fluid pills) 2) Glimepiride (any oral diabetic medications)  TAKE ONLY 1/2 DOSE OF INSULIN THE NIGHT BEFORE YOUR PROCEDURE. DO NOT TAKE ANY INSULIN ON THE DAY OF YOUR PROCEDURE.  On the morning of your procedure, take your Aspirin and your Plavix, and any morning medicines NOT listed above.  You may use sips of water.  5. Plan for one night stay--bring personal belongings. 6. Bring a current list of your medications and current insurance cards. 7. You MUST have a responsible  person to drive you home. 8. Someone MUST be with you the first 24 hours after you arrive home or your discharge will be delayed. 9. Please wear clothes that are easy to get on and off and wear slip-on shoes.  Thank you for allowing [place] to care for you!   -- [facility]  COVID PRE- TEST: You will need a COVID TEST prior to the procedure:  LOCATION: [facility] site.  DATE/TIME:  Friday 01/26/21 (anytime between 8 am and 1 pm)    Follow-Up: At [facility], you and your health needs are our priority.  As part of our continuing mission to provide you with exceptional heart care, we have created designated Provider Care Teams.  These Care Teams include your primary Cardiologist (physician) and Advanced Practice Providers (APPs -  Physician Assistants and Nurse Practitioners) who all work together to provide you with the care you need, when you need it.  We recommend signing up for the patient portal called "MyChart".  Sign up information is provided on this After Visit Summary.  MyChart is used to connect with patients for Virtual Visits (Telemedicine).  Patients are able to view lab/test results, encounter notes, upcoming appointments, etc.  Non-urgent messages can be sent to your provider as well.   To learn more about what you can do with MyChart, go to [email or web address].    Your next appointment:   Follow up after  cath procedure  The format for your next appointment:   In Person  Provider:   You may see Kathlyn Sacramento, MD or one of the following Advanced Practice Providers on your designated Care Team:    Murray Hodgkins, NP  Christell Faith, PA-C  Marrianne Mood, PA-C  Cadence Biehle, [place]  Laurann Montana, NP

## 2021-01-11 ENCOUNTER — Other Ambulatory Visit: Payer: Self-pay | Admitting: Physician Assistant

## 2021-01-11 LAB — COMPREHENSIVE METABOLIC PANEL
ALT: 24 IU/L (ref 0–32)
AST: 19 IU/L (ref 0–40)
Albumin/Globulin Ratio: 1.1 — ABNORMAL LOW (ref 1.2–2.2)
Albumin: 3 g/dL — ABNORMAL LOW (ref 3.8–4.8)
Alkaline Phosphatase: 111 IU/L (ref 44–121)
BUN/Creatinine Ratio: 19 (ref 9–23)
BUN: 30 mg/dL — ABNORMAL HIGH (ref 6–24)
Bilirubin Total: <0.2 mg/dL (ref 0.0–1.2)
CO2: 20 mmol/L (ref 20–29)
Calcium: 8.5 mg/dL — ABNORMAL LOW (ref 8.7–10.2)
Chloride: 107 mmol/L — ABNORMAL HIGH (ref 96–106)
Creatinine, Ser: 1.59 mg/dL — ABNORMAL HIGH (ref 0.57–1.00)
GFR calc Af Amer: 46 mL/min/{1.73_m2} — ABNORMAL LOW (ref >59)
GFR calc non Af Amer: 40 mL/min/{1.73_m2} — ABNORMAL LOW (ref >59)
Globulin, Total: 2.8 g/dL (ref 1.5–4.5)
Glucose: 73 mg/dL (ref 65–99)
Potassium: 4.8 mmol/L (ref 3.5–5.2)
Sodium: 136 mmol/L (ref 134–144)
Total Protein: 5.8 g/dL — ABNORMAL LOW (ref 6.0–8.5)

## 2021-01-11 LAB — CBC
Hematocrit: 32.4 % — ABNORMAL LOW (ref 34.0–46.6)
Hemoglobin: 10.2 g/dL — ABNORMAL LOW (ref 11.1–15.9)
MCH: 27.3 pg (ref 26.6–33.0)
MCHC: 31.5 g/dL (ref 31.5–35.7)
MCV: 87 fL (ref 79–97)
Platelets: 359 10*3/uL (ref 150–450)
RBC: 3.73 x10E6/uL — ABNORMAL LOW (ref 3.77–5.28)
RDW: 13.5 % (ref 11.7–15.4)
WBC: 7.2 10*3/uL (ref 3.4–10.8)

## 2021-01-11 MED ORDER — SODIUM CHLORIDE 0.9% FLUSH
3.0000 mL | Freq: Two times a day (BID) | INTRAVENOUS | Status: AC
  Filled 2021-01-11: qty 3

## 2021-01-18 ENCOUNTER — Ambulatory Visit: Payer: Medicare Other | Admitting: Family

## 2021-01-18 DIAGNOSIS — E113293 Type 2 diabetes mellitus with mild nonproliferative diabetic retinopathy without macular edema, bilateral: Secondary | ICD-10-CM | POA: Diagnosis not present

## 2021-01-18 DIAGNOSIS — H25012 Cortical age-related cataract, left eye: Secondary | ICD-10-CM | POA: Diagnosis not present

## 2021-01-18 DIAGNOSIS — E083293 Diabetes mellitus due to underlying condition with mild nonproliferative diabetic retinopathy without macular edema, bilateral: Secondary | ICD-10-CM | POA: Diagnosis not present

## 2021-01-18 DIAGNOSIS — H524 Presbyopia: Secondary | ICD-10-CM | POA: Diagnosis not present

## 2021-01-23 ENCOUNTER — Telehealth: Payer: Self-pay | Admitting: *Deleted

## 2021-01-23 NOTE — Telephone Encounter (Signed)
Pt confirmed via MyChart that she did receive the message regarding change in procedure time and IV hydration.  Pt will arrive at 6:30 AM for cath procedure 2/21.

## 2021-01-23 NOTE — Telephone Encounter (Signed)
Called pt to review updated instructions. No answer. Left detailed message, ok per DPR, however asked pt to please call back or send MyChart message to verify she did receive instructions.   Scheduling notified pt will need 4 hours of IV hydration prior to cath procedure on 2/21.  Cath procedure time has been moved to 10:30 AM and now will need to arrive at 6:30 AM.

## 2021-01-23 NOTE — Telephone Encounter (Signed)
-----   Message from Loel Dubonnet, NP sent at 01/23/2021  7:51 AM EST ----- Regarding: Cath and pre-hydration timing Hi,  Covering for JV while she is out. Received notification that [name] [name] will require 4 hours of hydration at 116ml/hr prior to her cardiac catheterization. JV and I will take care of ordering the fluids, but can you please assist me to coordinate with cath lab so we ensure she has an appropriate time slot?  She is present scheduled for cardiac cath 01/29/21 at 0930. With needing 4 hours of IVF prior to procedure we may need to change her arrival time and/or procedural time.   Thank you! Loel Dubonnet, NP

## 2021-01-26 ENCOUNTER — Other Ambulatory Visit
Admission: RE | Admit: 2021-01-26 | Discharge: 2021-01-26 | Disposition: A | Discharge status: Home / Self Care | Payer: Medicare Other | Source: Ambulatory Visit | Attending: Cardiovascular Disease | Admitting: Cardiovascular Disease

## 2021-01-26 ENCOUNTER — Other Ambulatory Visit: Payer: Self-pay

## 2021-01-26 DIAGNOSIS — Z01812 Encounter for preprocedural laboratory examination: Secondary | ICD-10-CM | POA: Diagnosis not present

## 2021-01-26 DIAGNOSIS — Z20822 Contact with and (suspected) exposure to covid-19: Secondary | ICD-10-CM | POA: Insufficient documentation

## 2021-01-26 LAB — SARS CORONAVIRUS 2 (TAT 6-24 HRS): SARS Coronavirus 2: NEGATIVE

## 2021-01-29 ENCOUNTER — Encounter: Payer: Self-pay | Admitting: Anesthesiology

## 2021-01-29 ENCOUNTER — Encounter: Payer: Self-pay | Admitting: Cardiovascular Disease

## 2021-01-29 ENCOUNTER — Encounter
Admission: RE | Disposition: A | Discharge status: Home / Self Care | Payer: Self-pay | Source: Home / Self Care | Attending: Cardiovascular Disease

## 2021-01-29 ENCOUNTER — Other Ambulatory Visit: Payer: Self-pay

## 2021-01-29 ENCOUNTER — Ambulatory Visit
Admission: RE | Admit: 2021-01-29 | Discharge: 2021-01-29 | Disposition: A | Discharge status: Home / Self Care | Payer: Medicare Other | Attending: Cardiovascular Disease | Admitting: Cardiovascular Disease

## 2021-01-29 DIAGNOSIS — I25118 Atherosclerotic heart disease of native coronary artery with other forms of angina pectoris: Secondary | ICD-10-CM

## 2021-01-29 DIAGNOSIS — I130 Hypertensive heart and chronic kidney disease with heart failure and stage 1 through stage 4 chronic kidney disease, or unspecified chronic kidney disease: Secondary | ICD-10-CM | POA: Insufficient documentation

## 2021-01-29 DIAGNOSIS — E1122 Type 2 diabetes mellitus with diabetic chronic kidney disease: Secondary | ICD-10-CM | POA: Diagnosis not present

## 2021-01-29 DIAGNOSIS — Z7902 Long term (current) use of antithrombotics/antiplatelets: Secondary | ICD-10-CM | POA: Diagnosis not present

## 2021-01-29 DIAGNOSIS — I5032 Chronic diastolic (congestive) heart failure: Secondary | ICD-10-CM

## 2021-01-29 DIAGNOSIS — E785 Hyperlipidemia, unspecified: Secondary | ICD-10-CM | POA: Diagnosis not present

## 2021-01-29 DIAGNOSIS — Z794 Long term (current) use of insulin: Secondary | ICD-10-CM | POA: Diagnosis not present

## 2021-01-29 DIAGNOSIS — N183 Chronic kidney disease, stage 3 unspecified: Secondary | ICD-10-CM | POA: Insufficient documentation

## 2021-01-29 DIAGNOSIS — Z7982 Long term (current) use of aspirin: Secondary | ICD-10-CM | POA: Insufficient documentation

## 2021-01-29 DIAGNOSIS — Z79899 Other long term (current) drug therapy: Secondary | ICD-10-CM | POA: Diagnosis not present

## 2021-01-29 DIAGNOSIS — Z91013 Allergy to seafood: Secondary | ICD-10-CM | POA: Diagnosis not present

## 2021-01-29 DIAGNOSIS — Z888 Allergy status to other drugs, medicaments and biological substances status: Secondary | ICD-10-CM | POA: Insufficient documentation

## 2021-01-29 DIAGNOSIS — Z7984 Long term (current) use of oral hypoglycemic drugs: Secondary | ICD-10-CM | POA: Diagnosis not present

## 2021-01-29 DIAGNOSIS — Z955 Presence of coronary angioplasty implant and graft: Secondary | ICD-10-CM | POA: Diagnosis not present

## 2021-01-29 DIAGNOSIS — I2510 Atherosclerotic heart disease of native coronary artery without angina pectoris: Secondary | ICD-10-CM | POA: Insufficient documentation

## 2021-01-29 DIAGNOSIS — R079 Chest pain, unspecified: Secondary | ICD-10-CM

## 2021-01-29 HISTORY — DX: Gastro-esophageal reflux disease without esophagitis: K21.9

## 2021-01-29 HISTORY — DX: Type 2 diabetes mellitus with unspecified diabetic retinopathy without macular edema: E11.319

## 2021-01-29 HISTORY — DX: Fatty (change of) liver, not elsewhere classified: K76.0

## 2021-01-29 HISTORY — PX: RIGHT/LEFT HEART CATH AND CORONARY ANGIOGRAPHY: CATH118266

## 2021-01-29 LAB — PREGNANCY, URINE: Preg Test, Ur: NEGATIVE

## 2021-01-29 LAB — GLUCOSE, CAPILLARY
Glucose-Capillary: 169 mg/dL — ABNORMAL HIGH (ref 70–99)
Glucose-Capillary: 137 mg/dL — ABNORMAL HIGH (ref 70–99)

## 2021-01-29 SURGERY — RIGHT/LEFT HEART CATH AND CORONARY ANGIOGRAPHY
Anesthesia: Moderate Sedation

## 2021-01-29 MED ORDER — SODIUM CHLORIDE 0.9% FLUSH: 3.0000 mL | INTRAVENOUS | Status: DC | PRN

## 2021-01-29 MED ORDER — VERAPAMIL HCL 2.5 MG/ML IV SOLN
INTRAVENOUS | Status: AC
  Filled 2021-01-29: qty 2

## 2021-01-29 MED ORDER — HEPARIN SODIUM (PORCINE) 1000 UNIT/ML IJ SOLN
INTRAMUSCULAR | Status: DC | PRN
  Administered 2021-01-29: 5000 [IU] via INTRAVENOUS

## 2021-01-29 MED ORDER — SODIUM CHLORIDE 0.9 % IV SOLN: INTRAVENOUS | Status: AC

## 2021-01-29 MED ORDER — SODIUM CHLORIDE 0.9 % IV SOLN: 250.0000 mL | INTRAVENOUS | Status: DC | PRN

## 2021-01-29 MED ORDER — FENTANYL CITRATE (PF) 100 MCG/2ML IJ SOLN
INTRAMUSCULAR | Status: AC
  Filled 2021-01-29: qty 2

## 2021-01-29 MED ORDER — IOHEXOL 300 MG/ML  SOLN
INTRAMUSCULAR | Status: DC | PRN
  Administered 2021-01-29: 30 mL

## 2021-01-29 MED ORDER — MIDAZOLAM HCL 2 MG/2ML IJ SOLN
INTRAMUSCULAR | Status: AC
  Filled 2021-01-29: qty 2

## 2021-01-29 MED ORDER — ACETAMINOPHEN 325 MG PO TABS: 650.0000 mg | ORAL_TABLET | ORAL | Status: DC | PRN

## 2021-01-29 MED ORDER — ONDANSETRON HCL 4 MG/2ML IJ SOLN: 4.0000 mg | Freq: Four times a day (QID) | INTRAMUSCULAR | Status: DC | PRN

## 2021-01-29 MED ORDER — SODIUM CHLORIDE 0.9% FLUSH: 3.0000 mL | Freq: Two times a day (BID) | INTRAVENOUS | Status: DC

## 2021-01-29 MED ORDER — LIDOCAINE HCL (PF) 1 % IJ SOLN
INTRAMUSCULAR | Status: DC | PRN
  Administered 2021-01-29 (×2): 2 mL

## 2021-01-29 MED ORDER — FENTANYL CITRATE (PF) 100 MCG/2ML IJ SOLN
INTRAMUSCULAR | Status: DC | PRN
  Administered 2021-01-29: 25 ug via INTRAVENOUS

## 2021-01-29 MED ORDER — HEPARIN SODIUM (PORCINE) 1000 UNIT/ML IJ SOLN
INTRAMUSCULAR | Status: AC
  Filled 2021-01-29: qty 1

## 2021-01-29 MED ORDER — MIDAZOLAM HCL 2 MG/2ML IJ SOLN
INTRAMUSCULAR | Status: DC | PRN
  Administered 2021-01-29: 1 mg via INTRAVENOUS

## 2021-01-29 MED ORDER — SODIUM CHLORIDE 0.9 % IV SOLN: INTRAVENOUS | Status: DC

## 2021-01-29 MED ORDER — HEPARIN (PORCINE) IN NACL 1000-0.9 UT/500ML-% IV SOLN
INTRAVENOUS | Status: AC
  Filled 2021-01-29: qty 1000

## 2021-01-29 MED ORDER — LABETALOL HCL 5 MG/ML IV SOLN: 10.0000 mg | INTRAVENOUS | Status: DC | PRN

## 2021-01-29 MED ORDER — VERAPAMIL HCL 2.5 MG/ML IV SOLN
INTRAVENOUS | Status: DC | PRN
  Administered 2021-01-29: 2.5 mg via INTRA_ARTERIAL

## 2021-01-29 MED ORDER — ASPIRIN 81 MG PO CHEW: 81.0000 mg | CHEWABLE_TABLET | ORAL | Status: DC

## 2021-01-29 MED ORDER — HEPARIN (PORCINE) IN NACL 1000-0.9 UT/500ML-% IV SOLN
INTRAVENOUS | Status: DC | PRN
  Administered 2021-01-29: 500 mL

## 2021-01-29 SURGICAL SUPPLY — 8 items
CATH 5F 110X4 TIG (CATHETERS) ×2 IMPLANT
CATH BALLN WEDGE 5F 110CM (CATHETERS) ×2 IMPLANT
DEVICE RAD TR BAND REGULAR (VASCULAR PRODUCTS) ×2 IMPLANT
GUIDEWIRE INQWIRE 1.5J.035X260 (WIRE) ×1 IMPLANT
INQWIRE 1.5J .035X260CM (WIRE) ×2
KIT MANI 3VAL PERCEP (MISCELLANEOUS) ×2 IMPLANT
PACK CARDIAC CATH (CUSTOM PROCEDURE TRAY) ×2 IMPLANT
SHEATH GLIDE SLENDER 4/5FR (SHEATH) ×2 IMPLANT

## 2021-01-29 NOTE — Discharge Instructions (Signed)
Radial Site Care  This sheet gives you information about how to care for yourself after your procedure. Your health care provider may also give you more specific instructions. If you have problems or questions, contact your health care provider. What can I expect after the procedure? After the procedure, it is common to have:  Bruising and tenderness at the catheter insertion area. Follow these instructions at home: Medicines  Take over-the-counter and prescription medicines only as told by your health care provider. Insertion site care  Follow instructions from your health care provider about how to take care of your insertion site. Make sure you: ? Wash your hands with soap and water before you change your bandage (dressing). If soap and water are not available, use hand sanitizer. ? Change your dressing as told by your health care provider. ? Leave stitches (sutures), skin glue, or adhesive strips in place. These skin closures may need to stay in place for 2 weeks or longer. If adhesive strip edges start to loosen and curl up, you may trim the loose edges. Do not remove adhesive strips completely unless your health care provider tells you to do that.  Check your insertion site every day for signs of infection. Check for: ? Redness, swelling, or pain. ? Fluid or blood. ? Pus or a bad smell. ? Warmth.  Do not take baths, swim, or use a hot tub until your health care provider approves.  You may shower 24-48 hours after the procedure, or as directed by your health care provider. ? Remove the dressing and gently wash the site with plain soap and water. ? Pat the area dry with a clean towel. ? Do not rub the site. That could cause bleeding.  Do not apply powder or lotion to the site. Activity  For 24 hours after the procedure, or as directed by your health care provider: ? Do not flex or bend the affected arm. ? Do not push or pull heavy objects with the affected arm. ? Do not drive  yourself home from the hospital or clinic. You may drive 24 hours after the procedure unless your health care provider tells you not to. ? Do not operate machinery or power tools.  Do not lift anything that is heavier than 10 lb (4.5 kg), or the limit that you are told, until your health care provider says that it is safe.  Ask your health care provider when it is okay to: ? Return to work or school. ? Resume usual physical activities or sports. ? Resume sexual activity.   General instructions  If the catheter site starts to bleed, raise your arm and put firm pressure on the site. If the bleeding does not stop, get help right away. This is a medical emergency.  If you went home on the same day as your procedure, a responsible adult should be with you for the first 24 hours after you arrive home.  Keep all follow-up visits as told by your health care provider. This is important. Contact a health care provider if:  You have a fever.  You have redness, swelling, or yellow drainage around your insertion site. Get help right away if:  You have unusual pain at the radial site.  The catheter insertion area swells very fast.  The insertion area is bleeding, and the bleeding does not stop when you hold steady pressure on the area.  Your arm or hand becomes pale, cool, tingly, or numb. These symptoms may represent a serious   problem that is an emergency. Do not wait to see if the symptoms will go away. Get medical help right away. Call your local emergency services (911 in the [place] Do not drive yourself to the hospital. Summary  After the procedure, it is common to have bruising and tenderness at the site.  Follow instructions from your health care provider about how to take care of your radial site wound. Check the wound every day for signs of infection.  Do not lift anything that is heavier than 10 lb (4.5 kg), or the limit that you are told, until your health care provider says that it  is safe. This information is not intended to replace advice given to you by your health care provider. Make sure you discuss any questions you have with your health care provider. Document Revised: 12/31/2017 Document Reviewed: 12/31/2017 Elsevier Patient Education  2021 Elsevier Inc.  

## 2021-01-29 NOTE — Interval H&P Note (Signed)
Cath Lab Visit (complete for each Cath Lab visit)  Clinical Evaluation Leading to the Procedure:   ACS: No.  Non-ACS:    Anginal Classification: CCS III  Anti-ischemic medical therapy: Maximal Therapy (2 or more classes of medications)  Non-Invasive Test Results: No non-invasive testing performed  Prior CABG: No previous CABG      History and Physical Interval Note:  01/29/2021 8:21 AM  [name] [name]  has presented today for surgery, with the diagnosis of RT LT Heart Cath   CAD  Chest pain Per [facility] patient needs 4 hrs hydration.  The various methods of treatment have been discussed with the patient and family. After consideration of risks, benefits and other options for treatment, the patient has consented to  Procedure(s): RIGHT/LEFT HEART CATH AND CORONARY ANGIOGRAPHY (N/A) as a surgical intervention.  The patient's history has been reviewed, patient examined, no change in status, stable for surgery.  I have reviewed the patient's chart and labs.  Questions were answered to the patient's satisfaction.     [name] [name]

## 2021-01-31 DIAGNOSIS — E1159 Type 2 diabetes mellitus with other circulatory complications: Secondary | ICD-10-CM | POA: Diagnosis not present

## 2021-02-08 NOTE — Progress Notes (Signed)
Cardiology Office Note   Date:  02/09/2021   ID:  Rogue, Pautler 1981/05/08, MRN [id number]  PCP:  Venita Lick, NP  Cardiologist:   Kathlyn Sacramento, MD   Chief Complaint  Patient presents with  . Follow up s/p cardiac cath     Patient c/o LE edema & chest pain at times. Medications reviewed by the patient verbally.       History of Present Illness: [name] [name] is a 40 y.o. female who presents for a follow-up regarding coronary artery disease. She has complex past medical history including type 2 diabetes mellitus which was diagnosed 2002, diabetic gastroparesis with chronic nausea and vomiting, coronary artery disease, ischemic cardiomyopathy with subsequent normalization of LV function, stage II-III chronic kidney disease, hypertension, hyperlipidemia, and obesity.  Cardiac history dates back to April 2016, when she was hospitalized with chest pain and non-STEMI.  She was found to have LV dysfunction with an EF of 30 to 35%, and underwent diagnostic catheterization revealing severe left circumflex and OM1 disease.  Both areas were successfully treated with drug-eluting stents.  Follow-up catheterization in May 2016 revealed patency of both stents with normalization of LV function by ventriculography.   Stress testing was performed in March 2017 which was nonischemic and low risk.  She had recurrent angina in December 2019.  Cardiac catheterization showed patent stent in OM1, focal in-stent restenosis in the left circumflex at the origin of OM 2 and new significant stenosis in the proximal to mid right coronary artery.  I performed balloon angioplasty of the mid left circumflex.  She underwent staged RCA PCI with drug-eluting stent placement.  She has known history of severe hyperlipidemia with intolerance to statins. She is currently on Praluent 75 mg every 2 weeks.   She has chronic kidney disease followed by nephrology and she has been having issues with hyperkalemia.  Potassium has been above 6 frequently and she is currently on Lokelma.  She had recent worsening of chest pain and edema and thus underwent a right and left cardiac catheterization which showed significant underlying three-vessel coronary artery disease with patent stents in the right coronary artery, left circumflex and OM1.  There was moderate in-stent restenosis in the mid left circumflex at the origin of OM 2.  Right heart catheterization showed normal filling pressures, normal pulmonary pressure and normal cardiac output.  She has been feeling better overall with no recurrent chest pain.  No significant edema.     Past Medical History:  Diagnosis Date  . CAD (coronary artery disease)    a. 03/2015 NSTEMI/PCI: LCX 139m (DES), OM1 100p (DES - vessel labeled Ramus in subsequent cath report). Minor irregs to LAD/RCA; b. 04/2015 Cath: LM nl, LAD 40p, RI patent stent, LCX patent stent, RCA nl, EF 55-65%; c. 02/2016 MV: basal inferolateral and mid inferolateral defect/infarct w/o ischemia. EF 55-65%; d. 11/2018 PCI: LM 30ost, LAD 30p, LCX 95p/m (PTCA->20%), OM1 10ost, OM2 40ost, RCA 85p.  Marland Kitchen Chronic abdominal pain   . Chronic diastolic (congestive) heart failure ([place])    a. 03/2015 Echo: EF 30-35%, mild concentric LVH, severe HK of inf, inflat, & lat walls, mod MR, mild TR;  b. 04/2015 LV Gram: EF 55-65%; c. 09/2017 Echo: EF 55-60%, no rwma. Mild MR; d. 11/2018 Echo: Ef 60-65%, no rwma.  . CKD (chronic kidney disease), stage III ([place])   . Diabetes type 2, controlled ([place])    a. since 2002   . Diabetic gastroparesis ([place])  a. chronic nausea/vomiting.  . Diabetic retinopathy ([place])   . Diverticulosis, sigmoid 07/2016  . Fatty liver   . GERD (gastroesophageal reflux disease)   . Heavy menses    a. H/O IUD - expired in 2014 - remains in place.  Marland Kitchen Hx of migraines   . Hyperlipidemia    Intolerant of atorvastatin, rosuvastatin  . Hypertension   . Iron deficiency anemia   . Ischemic cardiomyopathy  (resolved)    a. 03/2015 EF 30-35% post NSTEMI;  b. 04/2015 EF 55-65% on LV gram; c. 09/2017 Echo: EF 55-60%; d. 11/2018 Echo: Ef 60-65%.  . Obesity   . Tobacco abuse    a. quit 03/2015.  Marland Kitchen Uterine fibroid   . Vertigo   . Vitamin D deficiency     Past Surgical History:  Procedure Laterality Date  . APPENDECTOMY    . CARDIAC CATHETERIZATION  4/16   x2 stent [facility]  . CARDIAC CATHETERIZATION N/A 05/05/2015   Procedure: Left Heart Cath and Coronary Angiography;  Surgeon: Peter M Martinique, MD;  Location: [place] [facility];  Service: Cardiovascular;  Laterality: N/A;  . COLONOSCOPY WITH PROPOFOL N/A 07/31/2016   Procedure: COLONOSCOPY WITH PROPOFOL;  Surgeon: Lollie Sails, MD;  Location: [facility] ENDOSCOPY;  Service: Endoscopy;  Laterality: N/A;  . CORONARY BALLOON ANGIOPLASTY N/A 12/04/2018   Procedure: CORONARY BALLOON ANGIOPLASTY;  Surgeon: Wellington Hampshire, MD;  Location: [place] [facility];  Service: Cardiovascular;  Laterality: N/A;  . CORONARY STENT INTERVENTION N/A 01/12/2019   Procedure: CORONARY STENT INTERVENTION;  Surgeon: [name], MD;  Location: [place] [facility];  Service: Cardiovascular;  Laterality: N/A;  . ESOPHAGOGASTRODUODENOSCOPY (EGD) WITH PROPOFOL N/A 07/31/2016   Procedure: ESOPHAGOGASTRODUODENOSCOPY (EGD) WITH PROPOFOL;  Surgeon: Lollie Sails, MD;  Location: [facility] ENDOSCOPY;  Service: Endoscopy;  Laterality: N/A;  . INTRAVASCULAR PRESSURE WIRE/FFR STUDY N/A 12/04/2018   Procedure: INTRAVASCULAR PRESSURE WIRE/FFR STUDY;  Surgeon: Wellington Hampshire, MD;  Location: [place] [facility];  Service: Cardiovascular;  Laterality: N/A;  . LAPAROSCOPIC APPENDECTOMY N/A 08/07/2016   Procedure: APPENDECTOMY LAPAROSCOPIC;  Surgeon: Hubbard Robinson, MD;  Location: [facility] ORS;  Service: General;  Laterality: N/A;  . LEFT HEART CATH AND CORONARY ANGIOGRAPHY Left 12/04/2018   Procedure: LEFT HEART CATH AND CORONARY ANGIOGRAPHY;  Surgeon: Wellington Hampshire, MD;  Location: [place] [facility];  Service: Cardiovascular;  Laterality: Left;  . LEFT HEART CATH AND CORONARY ANGIOGRAPHY N/A 01/12/2019   Procedure: LEFT HEART CATH AND CORONARY ANGIOGRAPHY;  Surgeon: [name], MD;  Location: [place] [facility];  Service: Cardiovascular;  Laterality: N/A;  . MOUTH SURGERY    . RIGHT/LEFT HEART CATH AND CORONARY ANGIOGRAPHY N/A 01/29/2021   Procedure: RIGHT/LEFT HEART CATH AND CORONARY ANGIOGRAPHY;  Surgeon: Wellington Hampshire, MD;  Location: [place] [facility];  Service: Cardiovascular;  Laterality: N/A;     Current Outpatient Medications  Medication Sig Dispense Refill  . ACCU-CHEK AVIVA PLUS test strip USE 1 STRIP TO CHECK GLUCOSE THREE TIMES DAILY    . acetaminophen (TYLENOL) 500 MG tablet Take 1,000 mg by mouth every 4 (four) hours as needed.    Marland Kitchen amitriptyline (ELAVIL) 25 MG tablet Take 25 mg by mouth at bedtime as needed for sleep.    Marland Kitchen aspirin EC 81 MG tablet Take 81 mg by mouth daily.    . benzonatate (TESSALON) 100 MG capsule Take 1 capsule (100 mg total) by mouth 2 (two) times daily as needed for cough. 30 capsule 12  .  carvedilol (COREG) 25 MG tablet Take 1 tablet (25 mg total) by mouth 2 (two) times daily. 180 tablet 3  . Cholecalciferol 2000 units TABS Take 2,000 Units by mouth daily.    . clopidogrel (PLAVIX) 75 MG tablet Take 1 tablet (75 mg total) by mouth daily. 90 tablet 3  . DULoxetine (CYMBALTA) 60 MG capsule Take 60 mg by mouth daily as needed.  3  . etonogestrel (NEXPLANON) 68 MG IMPL implant 1 each by Subdermal route once.    . famotidine (PEPCID) 40 MG tablet Take 40 mg by mouth daily.    . fluticasone (FLONASE) 50 MCG/ACT nasal spray Place 2 sprays into both nostrils daily. 15.8 mL 2  . furosemide (LASIX) 20 MG tablet Take 1 tablet (20 mg total) by mouth daily. 90 tablet 3  . insulin aspart (NOVOLOG) 100 UNIT/ML injection Inject 10 Units into the skin 3 (three) times daily with meals. 10 mL 11  . Insulin Glargine (BASAGLAR KWIKPEN) 100  UNIT/ML Inject 70 Units into the skin daily.    . irbesartan (AVAPRO) 75 MG tablet Take 1 tablet (75 mg total) by mouth daily. 90 tablet 3  . isosorbide mononitrate (IMDUR) 120 MG 24 hr tablet Take 1 tablet (120 mg total) by mouth daily. 90 tablet 3  . LOKELMA 10 g PACK packet Take 10 g by mouth 3 (three) times a week.    . loperamide (IMODIUM) 2 MG capsule Take 2 mg by mouth as needed for diarrhea or loose stools.    . meclizine (ANTIVERT) 25 MG tablet Take 25 mg by mouth 3 (three) times daily as needed for dizziness.    . metoCLOPramide (REGLAN) 10 MG tablet Take 1 tablet (10 mg total) by mouth 4 (four) times daily -  before meals and at bedtime. 120 tablet 0  . mirtazapine (REMERON) 30 MG tablet Take 2 tablets (60 mg total) by mouth at bedtime.    . nitroGLYCERIN (NITROSTAT) 0.4 MG SL tablet Take 1 tab under your tongue while sitting for chest pain, If no relief may repeat, one tab every 5 min up to 3 tabs total over 15 mins. 25 tablet 3  . pantoprazole (PROTONIX) 40 MG tablet Take 1 tablet by mouth twice daily 180 tablet 0  . PRALUENT 75 MG/ML SOAJ Inject 75 mg into the skin every 14 (fourteen) days. 2 mL 11  . promethazine (PHENERGAN) 12.5 MG tablet TAKE 1 TABLET BY MOUTH EVERY 6 HOURS AS NEEDED FOR NAUSEA AND VOMITING 30 tablet 2   No current facility-administered medications for this visit.   Facility-Administered Medications Ordered in Other Visits  Medication Dose Route Frequency Provider Last Rate Last Admin  . sodium chloride flush (NS) 0.9 % injection 3 mL  3 mL Intravenous Q12H Visser, Jacquelyn D, PA-C        Allergies:   Lisinopril, Other, Rosemary oil, Shellfish allergy, Statins, and Metformin and related    Social History:  The patient  reports that she quit smoking about 5 years ago. She quit after 15.00 years of use. She has never used smokeless tobacco. She reports previous drug use. She reports that she does not drink alcohol.   Family History:  The patient's family  history includes Cancer in her maternal grandfather and maternal grandmother; Cancer - Cervical in her maternal aunt; Diabetes in her father, paternal grandfather, and paternal grandmother.    ROS:  Please see the history of present illness.   Otherwise, review of systems are positive for none.  All other systems are reviewed and negative.    PHYSICAL EXAM: VS:  BP 128/76 (BP Location: Left Arm, Patient Position: Sitting, Cuff Size: Large)   Pulse 72   Ht 5\' 5"  (1.651 m)   Wt 246 lb (111.6 kg)   BMI 40.94 kg/m  , BMI Body mass index is 40.94 kg/m. GEN: Well nourished, well developed, in no acute distress  HEENT: normal  Neck: no JVD, carotid bruits, or masses Cardiac:Mildly tachycardic; no murmurs, rubs, or gallops,no edema  Respiratory:  clear to auscultation bilaterally, normal work of breathing GI: soft, nontender, nondistended, + BS MS: no deformity or atrophy  Skin: warm and dry, no rash Neuro:  Strength and sensation are intact Psych: euthymic mood, full affect Right radial pulses normal with no hematoma.  EKG:  EKG is  ordered today. EKG showed normal sinus rhythm with no significant ST or T wave changes.    Recent Labs: 01/10/2021: ALT 24; BUN 30; Creatinine, Ser 1.59; Hemoglobin 10.2; Platelets 359; Potassium 4.8; Sodium 136    Lipid Panel    Component Value Date/Time   CHOL 217 (H) 08/18/2019 1354   CHOL 106 08/30/2015 1439   CHOL 133 03/31/2015 0225   TRIG 177 (H) 08/18/2019 1354   TRIG 46 08/30/2015 1439   TRIG 77 03/31/2015 0225   HDL 47 08/18/2019 1354   HDL 26 (L) 03/31/2015 0225   CHOLHDL 4.6 (H) 12/17/2018 1145   VLDL 9 08/30/2015 1439   VLDL 15 03/31/2015 0225   LDLCALC 138 (H) 08/18/2019 1354   LDLCALC 92 03/31/2015 0225   LDLDIRECT 163 (H) 12/17/2018 1145      Wt Readings from Last 3 Encounters:  02/09/21 246 lb (111.6 kg)  01/29/21 230 lb (104.3 kg)  01/10/21 244 lb (110.7 kg)        ASSESSMENT AND PLAN:  1.  Coronary artery disease  with other forms of angina: Intermittent atypical chest pain mostly in the setting of stress and with elevated blood pressure.  Recent cardiac catheterization showed no obstructive disease.  Continue medical therapy.  Symptoms improved overall.  2.  Chronic diastolic congestive heart failure: She appears to be euvolemic on current dose of furosemide 20 mg daily.  Recent right heart catheterization showed normal filling pressures.   3.  Essential hypertension: Blood pressure is well controlled on current medication.  4.  Hyperlipidemia: Intolerant to statins due to myalgia.  She is currently on Praluent and she has been more consistent in taking the medication.  I will check lipid and liver profile today.  If LDL is above 70, I will increase Praluent and consider adding Zetia.  I explained to her that we have to be very aggressive in lowering her LDL preferably below 55.    5.  CKD II-III: Stable and followed by nephrology.      Disposition:   FU with me or APP in 6 months  Signed,  Kathlyn Sacramento, MD  02/09/2021 8:43 AM    [place]

## 2021-02-09 ENCOUNTER — Ambulatory Visit (INDEPENDENT_AMBULATORY_CARE_PROVIDER_SITE_OTHER): Payer: Medicare Other | Admitting: Cardiovascular Disease

## 2021-02-09 ENCOUNTER — Other Ambulatory Visit: Payer: Self-pay

## 2021-02-09 ENCOUNTER — Encounter: Payer: Self-pay | Admitting: Cardiovascular Disease

## 2021-02-09 VITALS — BP 128/76 | HR 72 | Ht 65.0 in | Wt 246.0 lb

## 2021-02-09 DIAGNOSIS — I5032 Chronic diastolic (congestive) heart failure: Secondary | ICD-10-CM | POA: Diagnosis not present

## 2021-02-09 DIAGNOSIS — E785 Hyperlipidemia, unspecified: Secondary | ICD-10-CM | POA: Diagnosis not present

## 2021-02-09 DIAGNOSIS — I25118 Atherosclerotic heart disease of native coronary artery with other forms of angina pectoris: Secondary | ICD-10-CM

## 2021-02-09 DIAGNOSIS — N1831 Chronic kidney disease, stage 3a: Secondary | ICD-10-CM | POA: Diagnosis not present

## 2021-02-09 DIAGNOSIS — I10 Essential (primary) hypertension: Secondary | ICD-10-CM

## 2021-02-09 DIAGNOSIS — I255 Ischemic cardiomyopathy: Secondary | ICD-10-CM | POA: Diagnosis not present

## 2021-02-09 MED ORDER — IRBESARTAN 75 MG PO TABS: 75.0000 mg | ORAL_TABLET | Freq: Every day | ORAL | 3 refills | Status: DC

## 2021-02-09 MED ORDER — CARVEDILOL 25 MG PO TABS: 25.0000 mg | ORAL_TABLET | Freq: Two times a day (BID) | ORAL | 3 refills | Status: DC

## 2021-02-09 NOTE — Patient Instructions (Signed)
Medication Instructions:  Your physician recommends that you continue on your current medications as directed. Please refer to the Current Medication list given to you today.  *If you need a refill on your cardiac medications before your next appointment, please call your pharmacy*   Lab Work: Lipid and Lft today If you have labs (blood work) drawn today and your tests are completely normal, you will receive your results only by: Marland Kitchen MyChart Message (if you have MyChart) OR . A paper copy in the mail If you have any lab test that is abnormal or we need to change your treatment, we will call you to review the results.   Testing/Procedures: None ordered   Follow-Up: At [facility], you and your health needs are our priority.  As part of our continuing mission to provide you with exceptional heart care, we have created designated Provider Care Teams.  These Care Teams include your primary Cardiologist (physician) and Advanced Practice Providers (APPs -  Physician Assistants and Nurse Practitioners) who all work together to provide you with the care you need, when you need it.  We recommend signing up for the patient portal called "MyChart".  Sign up information is provided on this After Visit Summary.  MyChart is used to connect with patients for Virtual Visits (Telemedicine).  Patients are able to view lab/test results, encounter notes, upcoming appointments, etc.  Non-urgent messages can be sent to your provider as well.   To learn more about what you can do with MyChart, go to [email or web address].    Your next appointment:   6 month(s)  The format for your next appointment:   In Person  Provider:   You may see Kathlyn Sacramento, MD or one of the following Advanced Practice Providers on your designated Care Team:    Murray Hodgkins, NP  Christell Faith, PA-C  Marrianne Mood, PA-C  Cadence Blacklake, [place]  Laurann Montana, NP    Other Instructions N/A

## 2021-02-10 LAB — HEPATIC FUNCTION PANEL
ALT: 26 IU/L (ref 0–32)
AST: 20 IU/L (ref 0–40)
Albumin: 3.4 g/dL — ABNORMAL LOW (ref 3.8–4.8)
Alkaline Phosphatase: 127 IU/L — ABNORMAL HIGH (ref 44–121)
Bilirubin Total: <0.2 mg/dL (ref 0.0–1.2)
Bilirubin, Direct: <0.1 mg/dL (ref 0.00–0.40)
Total Protein: 6.1 g/dL (ref 6.0–8.5)

## 2021-02-10 LAB — LIPID PANEL
Chol/HDL Ratio: 3.1 ratio (ref 0.0–4.4)
Cholesterol, Total: 149 mg/dL (ref 100–199)
HDL: 48 mg/dL (ref >39)
LDL Chol Calc (NIH): 87 mg/dL (ref 0–99)
Triglycerides: 73 mg/dL (ref 0–149)
VLDL Cholesterol Cal: 14 mg/dL (ref 5–40)

## 2021-02-14 ENCOUNTER — Telehealth: Payer: Self-pay

## 2021-02-14 DIAGNOSIS — E785 Hyperlipidemia, unspecified: Secondary | ICD-10-CM

## 2021-02-14 DIAGNOSIS — E1169 Type 2 diabetes mellitus with other specified complication: Secondary | ICD-10-CM

## 2021-02-14 MED ORDER — PRALUENT 150 MG/ML ~~LOC~~ SOAJ: 1.0000 mL | SUBCUTANEOUS | 11 refills | Status: DC

## 2021-02-14 NOTE — Telephone Encounter (Signed)
-----   Message from Wellington Hampshire, MD sent at 02/14/2021  3:13 PM EST ----- Inform patient that labs were normal. Cholesterol improved significantly but still not at target.  Recommend increasing Praluent to 150 mg every 2 weeks.  Repeat lipid profile in 2 months.

## 2021-02-14 NOTE — Telephone Encounter (Signed)
Patient made aware of lab results and Dr. Fletcher Anon. Patient is agreeable with increasing Praluent to 150 mg every 14 days. Rx sent to the patients pharmacy. Order placed for 2 month fasting lipid and [facility]

## 2021-02-22 IMAGING — CT CT ABDOMEN WITHOUT CONTRAST
2 of 4 series · 16 of 46 positions shown, 18 images · non-contrast
Comparison: CT of the abdomen pelvis dated 06/19/2019

CLINICAL DATA: 38-year-old female with abdominal pain.
Gastroparesis and chronic abdominal pain.

EXAM:
CT ABDOMEN WITHOUT CONTRAST
TECHNIQUE: Multidetector CT imaging of the abdomen was performed following the
standard protocol without IV contrast.

[Series 2: axial st · axial · 0.88mm/px · z∈[-594,-314]mm · 13 of 62 slices shown, 15 images]
[im 3/62  soft-tissue]
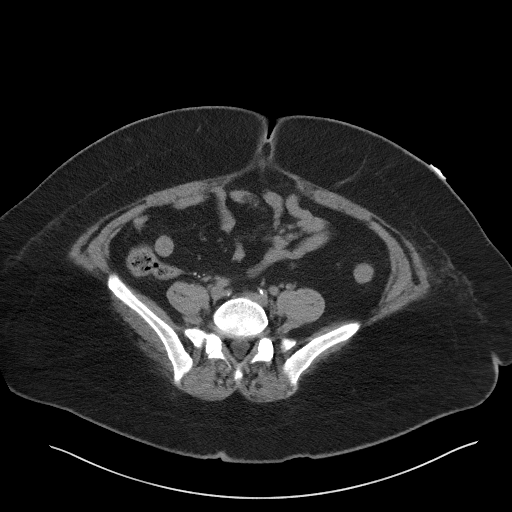
[im 3/62  bone]
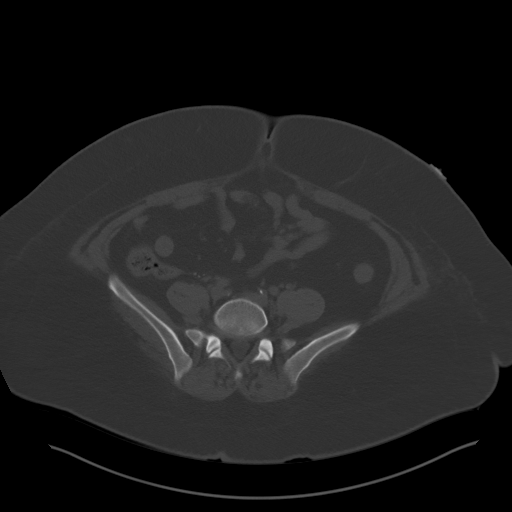
[im 8/62  soft-tissue]
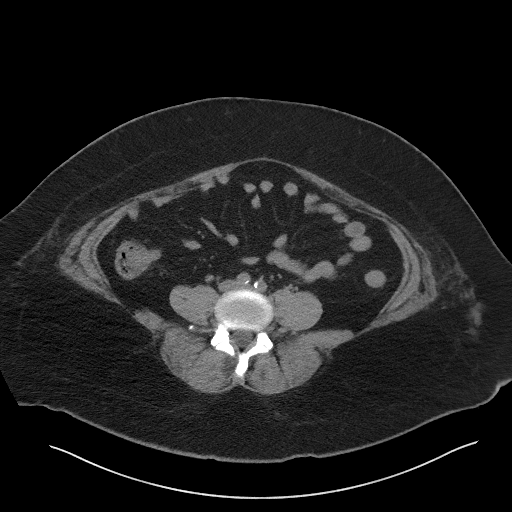
[im 14/62  soft-tissue]
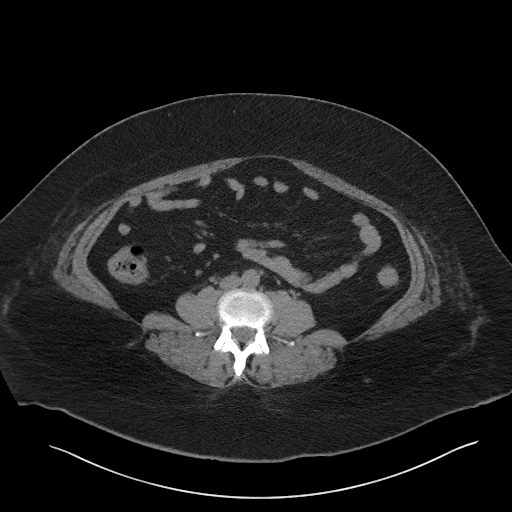
[im 16/62  soft-tissue]
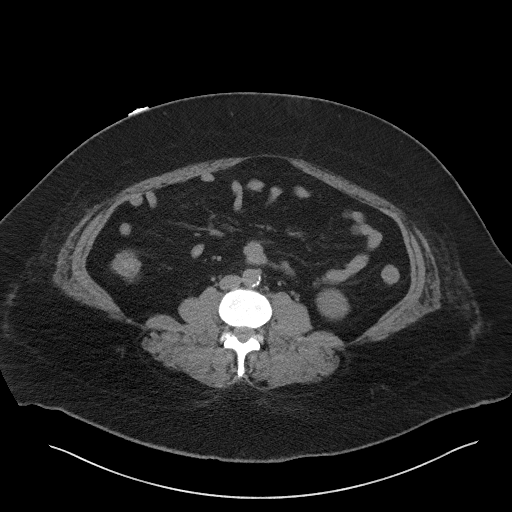
[im 22/62  soft-tissue]
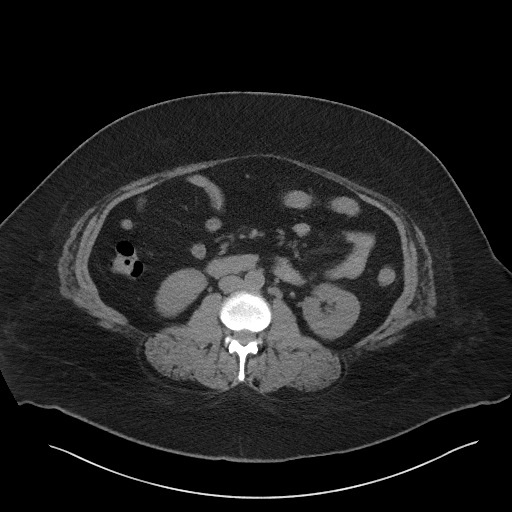
[im 27/62  soft-tissue]
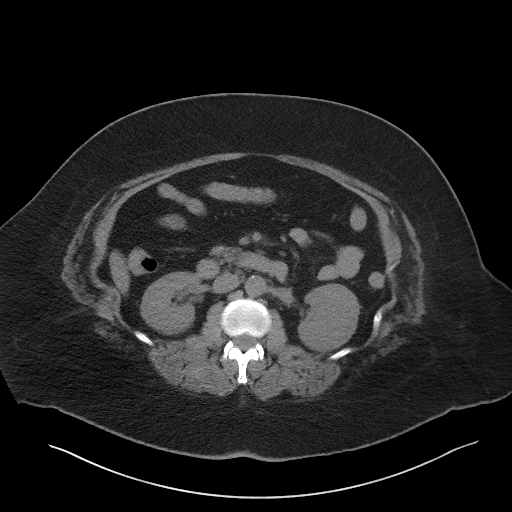
[im 32/62  soft-tissue]
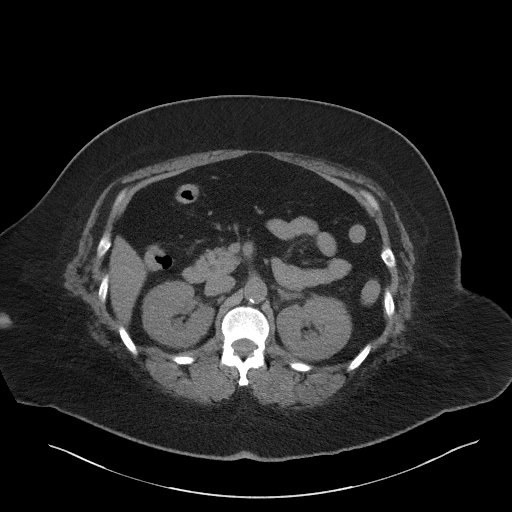
[im 35/62  soft-tissue]
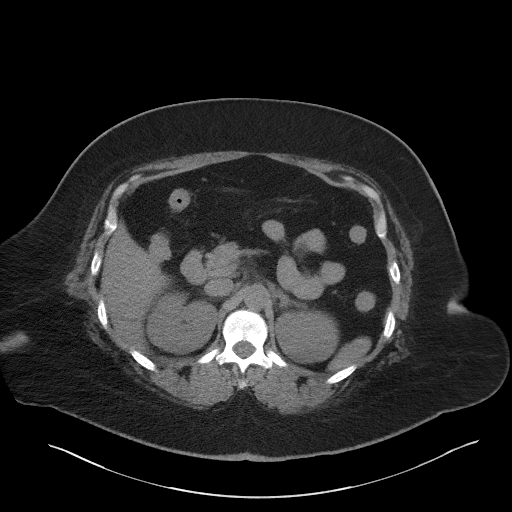
[im 40/62  soft-tissue]
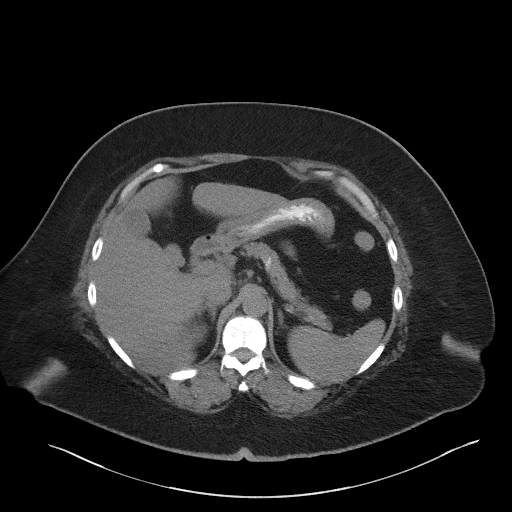
[im 40/62  bone]
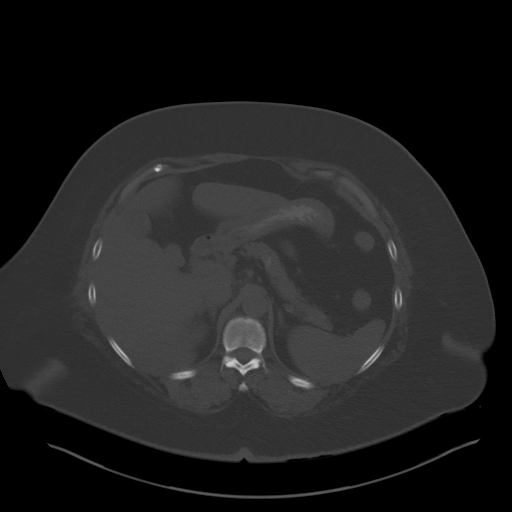
[im 46/62  soft-tissue]
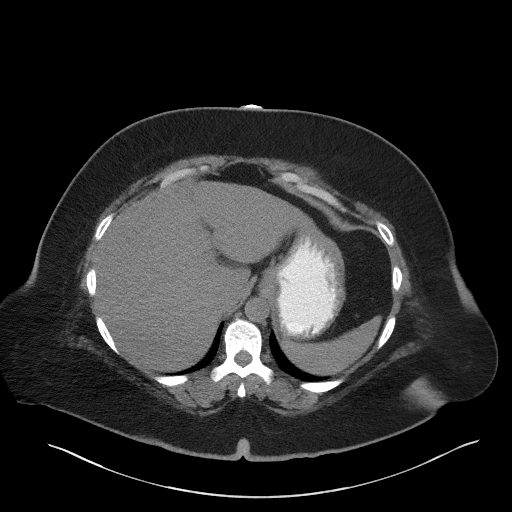
[im 48/62  soft-tissue]
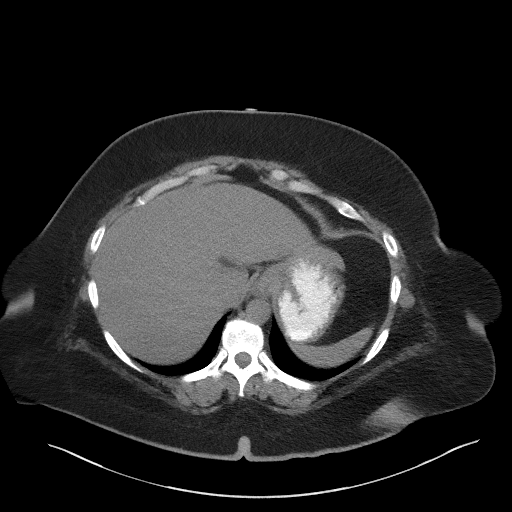
[im 54/62  soft-tissue]
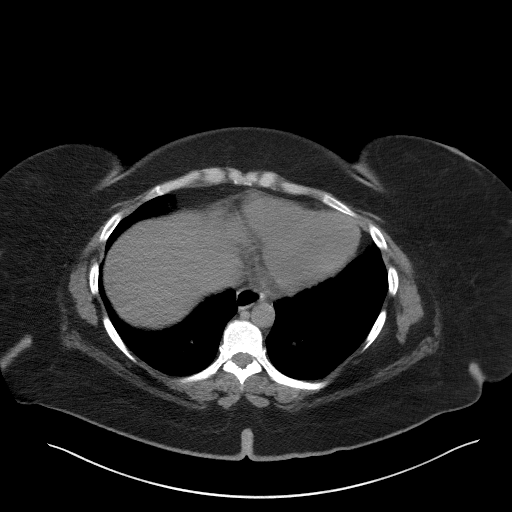
[im 59/62  soft-tissue]
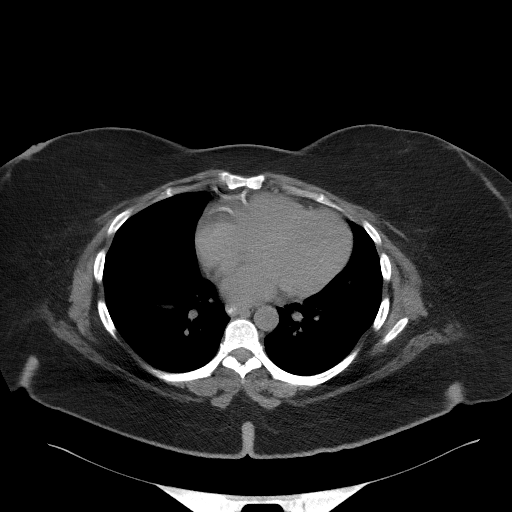

[Series 5: coronal st · coronal · 0.68mm/px · 3 of 94 slices shown]
[im 32/94  soft-tissue]
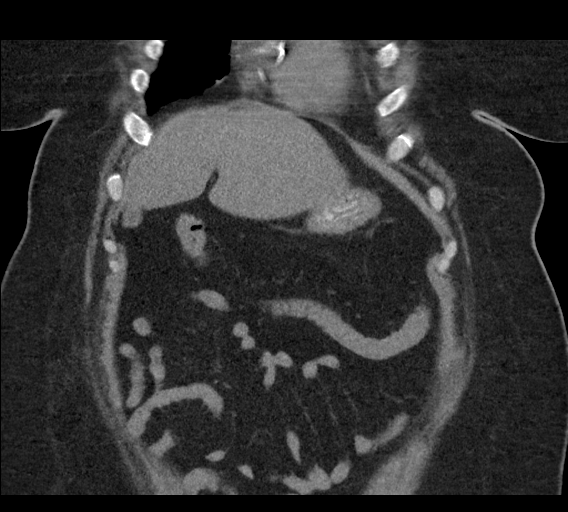
[im 42/94  soft-tissue]
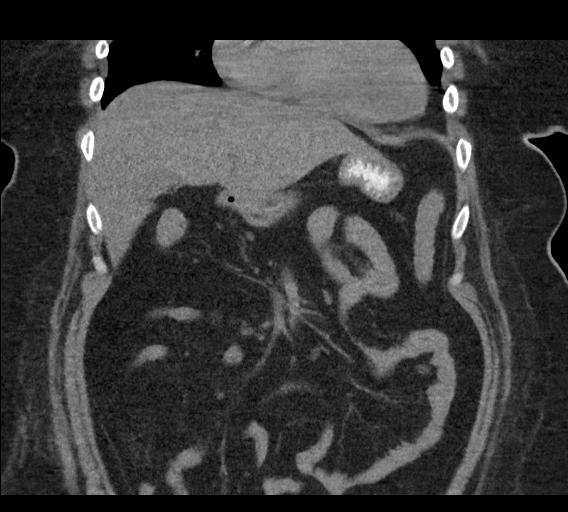
[im 52/94  soft-tissue]
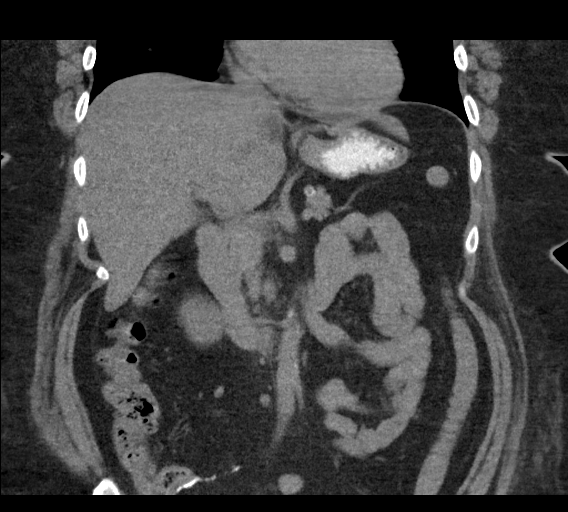

[16 of 46 positions shown; findings below may reference images not displayed]

FINDINGS: Evaluation of this exam is limited in the absence of intravenous
contrast.

Lower chest: The visualized lung bases are clear. There is coronary
vascular calcification. There is hypoattenuation of the cardiac
blood pool suggestive of a degree of anemia. Clinical correlation is
recommended.

No free air or free fluid noted in the abdomen.

Hepatobiliary: There is mild fatty infiltration of the liver. No
intrahepatic biliary ductal dilatation. The gallbladder is
unremarkable.

Pancreas: Unremarkable. No pancreatic ductal dilatation or
surrounding inflammatory changes.

Spleen: Normal in size without focal abnormality.

Adrenals/Urinary Tract: The adrenal glands are unremarkable. There
is no hydronephrosis or nephrolithiasis on either side. The
visualized ureters appear unremarkable.

Stomach/Bowel: Oral contrast is noted the stomach. The stomach is
unremarkable. There is no bowel dilatation the visualized abdomen.
No inflammatory changes of the bowel noted.

Vascular/Lymphatic: Mild atherosclerotic calcification of the aorta.
There is mild haziness of the fat surrounding celiac axis and SMA
similar to prior CT, nonspecific. No portal venous gas. There is no
adenopathy.

Other: None

Musculoskeletal: No acute or significant osseous findings.
IMPRESSION: 1. No acute intra-abdominal pathology.
2. Mild fatty liver.
3. Coronary vascular calcification and aortic Atherosclerosis
([id number]).

## 2021-02-27 DIAGNOSIS — N1832 Chronic kidney disease, stage 3b: Secondary | ICD-10-CM | POA: Diagnosis not present

## 2021-02-27 DIAGNOSIS — R8290 Unspecified abnormal findings in urine: Secondary | ICD-10-CM | POA: Diagnosis not present

## 2021-03-01 DIAGNOSIS — N1832 Chronic kidney disease, stage 3b: Secondary | ICD-10-CM | POA: Diagnosis not present

## 2021-03-01 DIAGNOSIS — E875 Hyperkalemia: Secondary | ICD-10-CM | POA: Diagnosis not present

## 2021-03-06 DIAGNOSIS — I10 Essential (primary) hypertension: Secondary | ICD-10-CM | POA: Diagnosis not present

## 2021-03-06 DIAGNOSIS — N2581 Secondary hyperparathyroidism of renal origin: Secondary | ICD-10-CM | POA: Diagnosis not present

## 2021-03-06 DIAGNOSIS — R801 Persistent proteinuria, unspecified: Secondary | ICD-10-CM | POA: Diagnosis not present

## 2021-03-06 DIAGNOSIS — E875 Hyperkalemia: Secondary | ICD-10-CM | POA: Diagnosis not present

## 2021-03-06 DIAGNOSIS — E1129 Type 2 diabetes mellitus with other diabetic kidney complication: Secondary | ICD-10-CM | POA: Diagnosis not present

## 2021-03-06 DIAGNOSIS — N1832 Chronic kidney disease, stage 3b: Secondary | ICD-10-CM | POA: Diagnosis not present

## 2021-03-08 DIAGNOSIS — N2581 Secondary hyperparathyroidism of renal origin: Secondary | ICD-10-CM | POA: Insufficient documentation

## 2021-03-12 DIAGNOSIS — N1832 Chronic kidney disease, stage 3b: Secondary | ICD-10-CM | POA: Diagnosis not present

## 2021-03-12 DIAGNOSIS — E875 Hyperkalemia: Secondary | ICD-10-CM | POA: Diagnosis not present

## 2021-03-30 DIAGNOSIS — I10 Essential (primary) hypertension: Secondary | ICD-10-CM | POA: Diagnosis not present

## 2021-03-30 DIAGNOSIS — E1159 Type 2 diabetes mellitus with other circulatory complications: Secondary | ICD-10-CM | POA: Diagnosis not present

## 2021-03-30 DIAGNOSIS — E875 Hyperkalemia: Secondary | ICD-10-CM | POA: Diagnosis not present

## 2021-04-04 DIAGNOSIS — E1169 Type 2 diabetes mellitus with other specified complication: Secondary | ICD-10-CM | POA: Diagnosis not present

## 2021-04-04 DIAGNOSIS — E1122 Type 2 diabetes mellitus with diabetic chronic kidney disease: Secondary | ICD-10-CM | POA: Diagnosis not present

## 2021-04-04 DIAGNOSIS — Z794 Long term (current) use of insulin: Secondary | ICD-10-CM | POA: Diagnosis not present

## 2021-04-04 DIAGNOSIS — E669 Obesity, unspecified: Secondary | ICD-10-CM | POA: Diagnosis not present

## 2021-04-04 DIAGNOSIS — E11311 Type 2 diabetes mellitus with unspecified diabetic retinopathy with macular edema: Secondary | ICD-10-CM | POA: Diagnosis not present

## 2021-04-04 DIAGNOSIS — N1832 Chronic kidney disease, stage 3b: Secondary | ICD-10-CM | POA: Diagnosis not present

## 2021-04-04 DIAGNOSIS — I700 Atherosclerosis of aorta: Secondary | ICD-10-CM | POA: Diagnosis not present

## 2021-04-04 DIAGNOSIS — E1159 Type 2 diabetes mellitus with other circulatory complications: Secondary | ICD-10-CM | POA: Diagnosis not present

## 2021-04-04 DIAGNOSIS — E1143 Type 2 diabetes mellitus with diabetic autonomic (poly)neuropathy: Secondary | ICD-10-CM | POA: Diagnosis not present

## 2021-04-04 DIAGNOSIS — I10 Essential (primary) hypertension: Secondary | ICD-10-CM | POA: Diagnosis not present

## 2021-04-16 DIAGNOSIS — I10 Essential (primary) hypertension: Secondary | ICD-10-CM | POA: Diagnosis not present

## 2021-04-16 DIAGNOSIS — E875 Hyperkalemia: Secondary | ICD-10-CM | POA: Diagnosis not present

## 2021-04-16 DIAGNOSIS — N2581 Secondary hyperparathyroidism of renal origin: Secondary | ICD-10-CM | POA: Diagnosis not present

## 2021-04-16 DIAGNOSIS — N1832 Chronic kidney disease, stage 3b: Secondary | ICD-10-CM | POA: Diagnosis not present

## 2021-04-16 DIAGNOSIS — E1129 Type 2 diabetes mellitus with other diabetic kidney complication: Secondary | ICD-10-CM | POA: Diagnosis not present

## 2021-04-16 DIAGNOSIS — R801 Persistent proteinuria, unspecified: Secondary | ICD-10-CM | POA: Diagnosis not present

## 2021-04-18 DIAGNOSIS — E113551 Type 2 diabetes mellitus with stable proliferative diabetic retinopathy, right eye: Secondary | ICD-10-CM | POA: Diagnosis not present

## 2021-04-18 DIAGNOSIS — E113512 Type 2 diabetes mellitus with proliferative diabetic retinopathy with macular edema, left eye: Secondary | ICD-10-CM | POA: Diagnosis not present

## 2021-05-23 DIAGNOSIS — E113512 Type 2 diabetes mellitus with proliferative diabetic retinopathy with macular edema, left eye: Secondary | ICD-10-CM | POA: Diagnosis not present

## 2021-05-28 ENCOUNTER — Other Ambulatory Visit: Payer: Self-pay | Admitting: *Deleted

## 2021-05-28 DIAGNOSIS — U071 COVID-19: Secondary | ICD-10-CM | POA: Diagnosis not present

## 2021-05-28 MED ORDER — CLOPIDOGREL BISULFATE 75 MG PO TABS: 75.0000 mg | ORAL_TABLET | Freq: Every day | ORAL | 3 refills | Status: DC

## 2021-06-27 DIAGNOSIS — E113512 Type 2 diabetes mellitus with proliferative diabetic retinopathy with macular edema, left eye: Secondary | ICD-10-CM | POA: Diagnosis not present

## 2021-07-04 DIAGNOSIS — E1159 Type 2 diabetes mellitus with other circulatory complications: Secondary | ICD-10-CM | POA: Diagnosis not present

## 2021-07-04 DIAGNOSIS — E669 Obesity, unspecified: Secondary | ICD-10-CM | POA: Diagnosis not present

## 2021-07-04 DIAGNOSIS — E1169 Type 2 diabetes mellitus with other specified complication: Secondary | ICD-10-CM | POA: Diagnosis not present

## 2021-07-11 DIAGNOSIS — E1122 Type 2 diabetes mellitus with diabetic chronic kidney disease: Secondary | ICD-10-CM | POA: Diagnosis not present

## 2021-07-11 DIAGNOSIS — Z6841 Body Mass Index (BMI) 40.0 and over, adult: Secondary | ICD-10-CM | POA: Diagnosis not present

## 2021-07-11 DIAGNOSIS — E1159 Type 2 diabetes mellitus with other circulatory complications: Secondary | ICD-10-CM | POA: Diagnosis not present

## 2021-07-11 DIAGNOSIS — Z794 Long term (current) use of insulin: Secondary | ICD-10-CM | POA: Diagnosis not present

## 2021-07-11 DIAGNOSIS — E1143 Type 2 diabetes mellitus with diabetic autonomic (poly)neuropathy: Secondary | ICD-10-CM | POA: Diagnosis not present

## 2021-07-11 DIAGNOSIS — E11311 Type 2 diabetes mellitus with unspecified diabetic retinopathy with macular edema: Secondary | ICD-10-CM | POA: Diagnosis not present

## 2021-07-11 DIAGNOSIS — E669 Obesity, unspecified: Secondary | ICD-10-CM | POA: Diagnosis not present

## 2021-07-11 DIAGNOSIS — N1832 Chronic kidney disease, stage 3b: Secondary | ICD-10-CM | POA: Diagnosis not present

## 2021-07-11 DIAGNOSIS — E1169 Type 2 diabetes mellitus with other specified complication: Secondary | ICD-10-CM | POA: Diagnosis not present

## 2021-07-13 ENCOUNTER — Other Ambulatory Visit: Payer: Self-pay

## 2021-07-16 MED ORDER — FAMOTIDINE 40 MG PO TABS: 40.0000 mg | ORAL_TABLET | Freq: Every day | ORAL | 0 refills | Status: DC

## 2021-07-16 MED ORDER — PANTOPRAZOLE SODIUM 40 MG PO TBEC: 40.0000 mg | DELAYED_RELEASE_TABLET | Freq: Two times a day (BID) | ORAL | 0 refills | Status: DC

## 2021-07-18 DIAGNOSIS — N1832 Chronic kidney disease, stage 3b: Secondary | ICD-10-CM | POA: Diagnosis not present

## 2021-07-18 DIAGNOSIS — R8290 Unspecified abnormal findings in urine: Secondary | ICD-10-CM | POA: Diagnosis not present

## 2021-07-24 DIAGNOSIS — N184 Chronic kidney disease, stage 4 (severe): Secondary | ICD-10-CM | POA: Diagnosis not present

## 2021-07-24 DIAGNOSIS — E1129 Type 2 diabetes mellitus with other diabetic kidney complication: Secondary | ICD-10-CM | POA: Diagnosis not present

## 2021-07-24 DIAGNOSIS — N2581 Secondary hyperparathyroidism of renal origin: Secondary | ICD-10-CM | POA: Diagnosis not present

## 2021-07-24 DIAGNOSIS — I10 Essential (primary) hypertension: Secondary | ICD-10-CM | POA: Diagnosis not present

## 2021-07-24 DIAGNOSIS — R801 Persistent proteinuria, unspecified: Secondary | ICD-10-CM | POA: Diagnosis not present

## 2021-07-24 DIAGNOSIS — E875 Hyperkalemia: Secondary | ICD-10-CM | POA: Diagnosis not present

## 2021-08-01 DIAGNOSIS — E113512 Type 2 diabetes mellitus with proliferative diabetic retinopathy with macular edema, left eye: Secondary | ICD-10-CM | POA: Diagnosis not present

## 2021-08-01 LAB — HM DIABETES EYE EXAM

## 2021-08-04 ENCOUNTER — Encounter: Payer: Self-pay | Admitting: Nurse Practitioner

## 2021-08-06 ENCOUNTER — Other Ambulatory Visit: Payer: Self-pay

## 2021-08-06 ENCOUNTER — Ambulatory Visit (INDEPENDENT_AMBULATORY_CARE_PROVIDER_SITE_OTHER): Payer: Medicare Other | Admitting: Nurse Practitioner

## 2021-08-06 ENCOUNTER — Encounter: Payer: Self-pay | Admitting: Nurse Practitioner

## 2021-08-06 VITALS — BP 129/79 | HR 76 | Temp 98.5°F | Wt 262.2 lb

## 2021-08-06 DIAGNOSIS — E1122 Type 2 diabetes mellitus with diabetic chronic kidney disease: Secondary | ICD-10-CM | POA: Diagnosis not present

## 2021-08-06 DIAGNOSIS — N2581 Secondary hyperparathyroidism of renal origin: Secondary | ICD-10-CM | POA: Diagnosis not present

## 2021-08-06 DIAGNOSIS — E1169 Type 2 diabetes mellitus with other specified complication: Secondary | ICD-10-CM

## 2021-08-06 DIAGNOSIS — Z975 Presence of (intrauterine) contraceptive device: Secondary | ICD-10-CM

## 2021-08-06 DIAGNOSIS — I700 Atherosclerosis of aorta: Secondary | ICD-10-CM

## 2021-08-06 DIAGNOSIS — I25118 Atherosclerotic heart disease of native coronary artery with other forms of angina pectoris: Secondary | ICD-10-CM

## 2021-08-06 DIAGNOSIS — I130 Hypertensive heart and chronic kidney disease with heart failure and stage 1 through stage 4 chronic kidney disease, or unspecified chronic kidney disease: Secondary | ICD-10-CM | POA: Diagnosis not present

## 2021-08-06 DIAGNOSIS — N183 Chronic kidney disease, stage 3 unspecified: Secondary | ICD-10-CM | POA: Diagnosis not present

## 2021-08-06 DIAGNOSIS — I255 Ischemic cardiomyopathy: Secondary | ICD-10-CM

## 2021-08-06 DIAGNOSIS — I5032 Chronic diastolic (congestive) heart failure: Secondary | ICD-10-CM | POA: Diagnosis not present

## 2021-08-06 DIAGNOSIS — R238 Other skin changes: Secondary | ICD-10-CM

## 2021-08-06 DIAGNOSIS — N184 Chronic kidney disease, stage 4 (severe): Secondary | ICD-10-CM | POA: Diagnosis not present

## 2021-08-06 DIAGNOSIS — E1165 Type 2 diabetes mellitus with hyperglycemia: Secondary | ICD-10-CM | POA: Diagnosis not present

## 2021-08-06 DIAGNOSIS — E1143 Type 2 diabetes mellitus with diabetic autonomic (poly)neuropathy: Secondary | ICD-10-CM

## 2021-08-06 DIAGNOSIS — D631 Anemia in chronic kidney disease: Secondary | ICD-10-CM

## 2021-08-06 DIAGNOSIS — E785 Hyperlipidemia, unspecified: Secondary | ICD-10-CM

## 2021-08-06 DIAGNOSIS — K3184 Gastroparesis: Secondary | ICD-10-CM

## 2021-08-06 DIAGNOSIS — Z6841 Body Mass Index (BMI) 40.0 and over, adult: Secondary | ICD-10-CM

## 2021-08-06 DIAGNOSIS — R233 Spontaneous ecchymoses: Secondary | ICD-10-CM

## 2021-08-06 NOTE — Assessment & Plan Note (Signed)
Chronic, ongoing, followed by cardiology.  Euvolemic today.  Continue current medication regimen as prescribed by them.  Recommend: - Reminded to call for an overnight weight gain of >2 pounds or a weekly weight gain of >5 pounds - not adding salt to food and read food labels. Reviewed the importance of keeping daily sodium intake to 2000mg  daily - Avoid all NSAID products, referral to dietician placed to help with diet.

## 2021-08-06 NOTE — Assessment & Plan Note (Signed)
Stable, no further discomfort.  Continue current diabetic regimen as ordered by endo and collaboration with both GI and endocrinology.  Continue to collaborate with CCM team as needed.  Return in 3 months.

## 2021-08-06 NOTE — Patient Instructions (Signed)
Chronic Kidney Disease, Adult Chronic kidney disease is when lasting damage happens to the kidneys slowly over a long time. The kidneys help to: Make pee (urine). Make hormones. Keep the right amount of fluids and chemicals in the body. Most often, this disease does not go away. You must take steps to help keep the kidney damage from getting worse. If steps are not taken, the kidneys might stop working forever. What are the causes? Diabetes. High blood pressure. Diseases that affect the heart and blood vessels. Other kidney diseases. Diseases of the body's disease-fighting system. A problem with the flow of pee. Infections of the organs that make pee, store it, and take it out of the body. Swelling or irritation of your blood vessels. What increases the risk? Getting older. Having someone in your family who has kidney disease or kidney failure. Having a disease caused by genes. Taking medicines often that harm the kidneys. Being near or having contact with harmful substances. Being very overweight. Using tobacco now or in the past. What are the signs or symptoms? Feeling very tired. Having a swollen face, legs, ankles, or feet. Feeling like you may vomit or vomiting. Not feeling hungry. Being confused or not able to focus. Twitches and cramps in the leg muscles or other muscles. Dry, itchy skin. A taste of metal in your mouth. Making less pee, or making more pee. Shortness of breath. Trouble sleeping. You may also become anemic or get weak bones. Anemic means there is not enough red blood cells or hemoglobin in your blood. You may get symptoms slowly. You may not notice them until the kidney damage gets very bad. How is this treated? Often, there is no cure for this disease. Treatment can help with symptoms and help keep the disease from getting worse. You may need to: Avoid alcohol. Avoid foods that are high in salt, potassium, phosphorous, and protein. Take medicines for  symptoms and to help control other conditions. Have dialysis. This treatment gets harmful waste out of your body. Treat other problems that cause your kidney disease or make it worse. Follow these instructions at home: Medicines Take over-the-counter and prescription medicines only as told by your doctor. Do not take any new medicines, vitamins, or supplements unless your doctor says it is okay. Lifestyle  Do not smoke or use any products that contain nicotine or tobacco. If you need help quitting, ask your doctor. If you drink alcohol: Limit how much you use to: 0-1 drink a day for women who are not pregnant. 0-2 drinks a day for men. Know how much alcohol is in your drink. In the [place] one drink equals one 12 oz bottle of beer (355 mL), one 5 oz glass of wine (148 mL), or one 1 oz glass of hard liquor (44 mL). Stay at a healthy weight. If you need help losing weight, ask your doctor. General instructions  Follow instructions from your doctor about what you cannot eat or drink. Track your blood pressure at home. Tell your doctor about any changes. If you have diabetes, track your blood sugar. Exercise at least 30 minutes a day, 5 days a week. Keep your shots (vaccinations) up to date. Keep all follow-up visits. Where to find more information American Association of Kidney Patients: www.aakp.org National Kidney Foundation: www.kidney.org American Kidney Fund: www.akfinc.org Life Options: www.lifeoptions.org Kidney School: www.kidneyschool.org Contact a doctor if: Your symptoms get worse. You get new symptoms. Get help right away if: You get symptoms of end-stage kidney disease. These   include: Headaches. Losing feeling in your hands or feet. Easy bruising. Having hiccups often. Chest pain. Shortness of breath. Lack of menstrual periods, in women. You have a fever. You make less pee than normal. You have pain or you bleed when you pee or poop. These symptoms may be an  emergency. Get help right away. Call your local emergency services (911 in the [place] Do not wait to see if the symptoms will go away. Do not drive yourself to the hospital. Summary Chronic kidney disease is when lasting damage happens to the kidneys slowly over a long time. Causes of this disease include diabetes and high blood pressure. Often, there is no cure for this disease. Treatment can help symptoms and help keep the disease from getting worse. Treatment may involve lifestyle changes, medicines, and dialysis. This information is not intended to replace advice given to you by your health care provider. Make sure you discuss any questions you have with your health care provider. Document Revised: 03/01/2020 Document Reviewed: 03/01/2020 Elsevier Patient Education  2022 Elsevier Inc.  

## 2021-08-06 NOTE — Assessment & Plan Note (Signed)
Noted on past imaging.  Continue Plavix and Praluent for prevention. 

## 2021-08-06 NOTE — Assessment & Plan Note (Signed)
Chronic, stable, followed by cardiology.  Continue this collaboration and medication regimen as prescribed by them. 

## 2021-08-06 NOTE — Assessment & Plan Note (Signed)
Chronic, stable on recent A1c with endo = 7.4%.  Continue current medication regimen as prescribed by endo and collaboration with them.  CMP today, obtain urine ALB next visit.  Dietician referral placed to further delve into diet changes.  Return in 3 months.

## 2021-08-06 NOTE — Assessment & Plan Note (Signed)
Check CBC and iron/ferritin byr PT/INR today -- CKD anemia -- suspect cause of easier bruising, discussed with patient.

## 2021-08-06 NOTE — Assessment & Plan Note (Signed)
Chronic, ongoing.  BP at goal today.  Recommend she monitor BP at least a few mornings a week at home and document.  DASH diet at home.  Continue current medication regimen and adjust as needed -- continue collaboration with cardiology.  Labs today: CMP and lipid.  Return in 3 months.

## 2021-08-06 NOTE — Progress Notes (Signed)
BP 129/79   Pulse 76   Temp 98.5 F (36.9 C) (Oral)   Wt 262 lb 3.2 oz (118.9 kg)   SpO2 92%   BMI 43.63 kg/m    Subjective:    Patient ID: [name] [name], female    DOB: 1980-12-25, 40 y.o.   MRN: [id number]  HPI: [name] [name] is a 40 y.o. female  Chief Complaint  Patient presents with   Bleeding/Bruising   Diabetes    Patient Diabetic Eye Exam was requested at today's visit. Patient states she would like to have her levels checked at today's visit.    Hyperlipidemia   Hypertension   Congestive Heart Failure   Needs referral to GYN has Nexplanon in place since 2017.    DIABETES WITH GASTROPARESIS Last seen by endocrinology 07/11/21 with A1c 7.4%.  Her Basaglar was changed to 70 units and Novolog is on sliding scale.  Previously was being followed by GI, reports less issues with stomach now.  She is using Dexcom. Hypoglycemic episodes:no Polydipsia/polyuria: no Visual disturbance: no Chest pain: no Paresthesias: no Glucose Monitoring: yes  Accucheck frequency: BID  Fasting glucose: varies  Post prandial:  Evening: varies  Before meals: Taking Insulin?: yes  Long acting insulin: 70 Basaglar  Short acting insulin: sliding scale Blood Pressure Monitoring: daily Retinal Examination: Up to Date Foot Exam: Up to Date Pneumovax: Up to Date Influenza: Up to Date Aspirin: yes   HYPERTENSION / HYPERLIPIDEMIA Followed by cardiology and last seen 02/09/21. Is statin intolerant and takes Praluent.   Satisfied with current treatment? yes Duration of hypertension: chronic BP monitoring frequency: daily BP range: 120-140/80's BP medication side effects: no Duration of hyperlipidemia: chronic Cholesterol supplements: none Medication compliance: good compliance Aspirin: yes Recent stressors: no Recurrent headaches: no Visual changes: no Palpitations: no Dyspnea: no Chest pain: no Lower extremity edema: yes -- CKD and HF Dizzy/lightheaded: occasional with low BP  levels at times  CHRONIC KIDNEY DISEASE Is followed by nephrology for CKD and last saw 07/24/21.  Recent labs on 07/24/21 = CRT 2.54, eGFR 24, PTH 122.  Dose of Lokelma adjusted.  She does noticed bruising more easily over past 3 months == recent CBC with nephrology did note 10.1/32.3. CKD status: stable Medications renally dose: yes Previous renal evaluation: yes Pneumovax:  Up to Date Influenza Vaccine:  Up to Date   Relevant past medical, surgical, family and social history reviewed and updated as indicated. Interim medical history since our last visit reviewed. Allergies and medications reviewed and updated.  Review of Systems  Constitutional:  Negative for activity change, appetite change, diaphoresis, fatigue and fever.  Respiratory:  Negative for cough, chest tightness and shortness of breath.   Cardiovascular:  Negative for chest pain, palpitations and leg swelling.  Gastrointestinal:  Negative for abdominal distention, abdominal pain, constipation, diarrhea, nausea and vomiting.  Endocrine: Negative for cold intolerance, heat intolerance, polydipsia, polyphagia and polyuria.  Neurological:  Negative for dizziness, syncope, weakness, light-headedness, numbness and headaches.  Psychiatric/Behavioral: Negative.     Per HPI unless specifically indicated above     Objective:    BP 129/79   Pulse 76   Temp 98.5 F (36.9 C) (Oral)   Wt 262 lb 3.2 oz (118.9 kg)   SpO2 92%   BMI 43.63 kg/m   Wt Readings from Last 3 Encounters:  08/06/21 262 lb 3.2 oz (118.9 kg)  02/09/21 246 lb (111.6 kg)  01/29/21 230 lb (104.3 kg)    Physical Exam Vitals and nursing  note reviewed.  Constitutional:      General: She is awake. She is not in acute distress.    Appearance: She is well-developed. She is obese. She is not ill-appearing.  HENT:     Head: Normocephalic.     Right Ear: Hearing normal.     Left Ear: Hearing normal.  Eyes:     General: Lids are normal.        Right eye: No  discharge.        Left eye: No discharge.     Conjunctiva/sclera: Conjunctivae normal.     Pupils: Pupils are equal, round, and reactive to light.  Neck:     Thyroid: No thyromegaly.     Vascular: No carotid bruit.  Cardiovascular:     Rate and Rhythm: Normal rate and regular rhythm.     Heart sounds: Normal heart sounds. No murmur heard.   No gallop.  Pulmonary:     Effort: Pulmonary effort is normal. No accessory muscle usage or respiratory distress.     Breath sounds: Normal breath sounds.  Abdominal:     General: Bowel sounds are normal.     Palpations: Abdomen is soft. There is no hepatomegaly or splenomegaly.     Tenderness: There is no abdominal tenderness.  Musculoskeletal:     Cervical back: Normal range of motion and neck supple.     Right lower leg: No edema.     Left lower leg: No edema.  Skin:    General: Skin is warm and dry.     Comments: No current bruises  Neurological:     Mental Status: She is alert and oriented to person, place, and time.  Psychiatric:        Attention and Perception: Attention normal.        Mood and Affect: Mood normal.        Behavior: Behavior normal. Behavior is cooperative.        Thought Content: Thought content normal.        Judgment: Judgment normal.    Results for orders placed or performed in visit on 02/09/21  Lipid panel  Result Value Ref Range   Cholesterol, Total 149 100 - 199 mg/dL   Triglycerides 73 0 - 149 mg/dL   HDL 48 >39 mg/dL   VLDL Cholesterol Cal 14 5 - 40 mg/dL   LDL Chol Calc (NIH) 87 0 - 99 mg/dL   Chol/HDL Ratio 3.1 0.0 - 4.4 ratio  Hepatic function panel  Result Value Ref Range   Total Protein 6.1 6.0 - 8.5 g/dL   Albumin 3.4 (L) 3.8 - 4.8 g/dL   Bilirubin Total <0.2 0.0 - 1.2 mg/dL   Bilirubin, Direct <0.10 0.00 - 0.40 mg/dL   Alkaline Phosphatase 127 (H) 44 - 121 IU/L   AST 20 0 - 40 IU/L   ALT 26 0 - 32 IU/L      Assessment & Plan:   Problem List Items Addressed This Visit        Cardiovascular and Mediastinum   Chronic diastolic heart failure (HCC) (Chronic)    Chronic, ongoing, followed by cardiology.  Euvolemic today.  Continue current medication regimen as prescribed by them.  Recommend: - Reminded to call for an overnight weight gain of >2 pounds or a weekly weight gain of >5 pounds - not adding salt to food and read food labels. Reviewed the importance of keeping daily sodium intake to '2000mg'$  daily - Avoid all NSAID products, referral to dietician  placed to help with diet.      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  BP at goal today.  Recommend she monitor BP at least a few mornings a week at home and document.  DASH diet at home.  Continue current medication regimen and adjust as needed -- continue collaboration with cardiology.  Labs today: CMP and lipid.  Return in 3 months.       Relevant Orders   Lipid Panel w/o Chol/HDL Ratio   Amb ref to Medical Nutrition Therapy-MNT   Coronary artery disease of native artery of native heart with stable angina pectoris (HCC)    Chronic, stable, followed by cardiology.  Continue this collaboration and medication regimen as prescribed by them.      Atherosclerosis of aorta ([place])    Noted on past imaging.  Continue Plavix and Praluent for prevention.        Digestive   Diabetic gastroparesis associated with type 2 diabetes mellitus (HCC)    Stable, no further discomfort.  Continue current diabetic regimen as ordered by endo and collaboration with both GI and endocrinology.  Continue to collaborate with CCM team as needed.  Return in 3 months.        Endocrine   Hyperlipidemia associated with type 2 diabetes mellitus (HCC)    Chronic, ongoing with poor tolerance to statins.  Followed by cardiology.  Continue Praluent, as is intolerant to statins.  Lipid panel today.      Relevant Orders   Lipid Panel w/o Chol/HDL Ratio   Comprehensive metabolic panel    Amb ref to Medical Nutrition Therapy-MNT   Poorly controlled type 2 diabetes mellitus ([place]) - Primary    Chronic, stable on recent A1c with endo = 7.4%.  Continue current medication regimen as prescribed by endo and collaboration with them.  CMP today, obtain urine ALB next visit.  Dietician referral placed to further delve into diet changes.  Return in 3 months.      CKD stage 3 due to type 2 diabetes mellitus (HCC)    Chronic, ongoing, followed by nephrology.  Recent notes and labs reviewed.  Continue current medications as prescribed by them.  CMP today.  Dietician referral placed.      Relevant Orders   CBC with Differential/Platelet   Iron, TIBC and Ferritin Panel   INR/PT   Amb ref to Medical Nutrition Therapy-MNT   Hyperparathyroidism due to renal insufficiency (HCC)    Chronic, ongoing, followed by nephrology.  Continue this collaboration - recent note and labs reviewed.        Other   Obesity    BMI 43.63 with T2DM, HF, CKD.  Recommended eating smaller high protein, low fat meals more frequently and exercising 30 mins a day 5 times a week with a goal of 10-15lb weight loss in the next 3 months. Patient voiced their understanding and motivation to adhere to these recommendations.       Anemia    Check CBC and iron/ferritin byr PT/INR today -- CKD anemia -- suspect cause of easier bruising, discussed with patient.      Other Visit Diagnoses     Bruises easily       Labs today, suspect related to anemia with CKD noted on recent labs.   Relevant Orders   CBC with Differential/Platelet   Iron, TIBC and Ferritin Panel   INR/PT   Nexplanon in place  GYN referral.   Relevant Orders   Ambulatory referral to Gynecology        Follow up plan: Return in about 3 months (around 11/06/2021) for T2DM, HTN/HLD, CKD, HF -- with pap possibly.

## 2021-08-06 NOTE — Assessment & Plan Note (Signed)
BMI 43.63 with T2DM, HF, CKD.  Recommended eating smaller high protein, low fat meals more frequently and exercising 30 mins a day 5 times a week with a goal of 10-15lb weight loss in the next 3 months. Patient voiced their understanding and motivation to adhere to these recommendations.

## 2021-08-06 NOTE — Assessment & Plan Note (Signed)
Chronic, ongoing with poor tolerance to statins.  Followed by cardiology.  Continue Praluent, as is intolerant to statins.  Lipid panel today.

## 2021-08-06 NOTE — Assessment & Plan Note (Signed)
Chronic, ongoing, followed by nephrology.  Continue this collaboration - recent note and labs reviewed. 

## 2021-08-06 NOTE — Assessment & Plan Note (Signed)
Chronic, ongoing, followed by nephrology.  Recent notes and labs reviewed.  Continue current medications as prescribed by them.  CMP today.  Dietician referral placed.

## 2021-08-07 ENCOUNTER — Telehealth: Payer: Self-pay

## 2021-08-07 LAB — IRON,TIBC AND FERRITIN PANEL
Ferritin: 131 ng/mL (ref 15–150)
Iron Saturation: 24 % (ref 15–55)
Iron: 67 ug/dL (ref 27–159)
Total Iron Binding Capacity: 274 ug/dL (ref 250–450)
UIBC: 207 ug/dL (ref 131–425)

## 2021-08-07 LAB — CBC WITH DIFFERENTIAL/PLATELET
Basophils Absolute: 0 10*3/uL (ref 0.0–0.2)
Basos: 1 %
EOS (ABSOLUTE): 1.2 10*3/uL — ABNORMAL HIGH (ref 0.0–0.4)
Eos: 16 %
Hematocrit: 34.9 % (ref 34.0–46.6)
Hemoglobin: 11 g/dL — ABNORMAL LOW (ref 11.1–15.9)
Immature Grans (Abs): 0 10*3/uL (ref 0.0–0.1)
Immature Granulocytes: 0 %
Lymphocytes Absolute: 2.2 10*3/uL (ref 0.7–3.1)
Lymphs: 31 %
MCH: 27 pg (ref 26.6–33.0)
MCHC: 31.5 g/dL (ref 31.5–35.7)
MCV: 86 fL (ref 79–97)
Monocytes Absolute: 0.3 10*3/uL (ref 0.1–0.9)
Monocytes: 5 %
Neutrophils Absolute: 3.5 10*3/uL (ref 1.4–7.0)
Neutrophils: 47 %
Platelets: 366 10*3/uL (ref 150–450)
RBC: 4.07 x10E6/uL (ref 3.77–5.28)
RDW: 13.3 % (ref 11.7–15.4)
WBC: 7.3 10*3/uL (ref 3.4–10.8)

## 2021-08-07 LAB — LIPID PANEL W/O CHOL/HDL RATIO
Cholesterol, Total: 154 mg/dL (ref 100–199)
HDL: 42 mg/dL (ref >39)
LDL Chol Calc (NIH): 93 mg/dL (ref 0–99)
Triglycerides: 105 mg/dL (ref 0–149)
VLDL Cholesterol Cal: 19 mg/dL (ref 5–40)

## 2021-08-07 LAB — COMPREHENSIVE METABOLIC PANEL
ALT: 18 IU/L (ref 0–32)
AST: 16 IU/L (ref 0–40)
Albumin/Globulin Ratio: 1.1 — ABNORMAL LOW (ref 1.2–2.2)
Albumin: 3.3 g/dL — ABNORMAL LOW (ref 3.8–4.8)
Alkaline Phosphatase: 110 IU/L (ref 44–121)
BUN/Creatinine Ratio: 20 (ref 9–23)
BUN: 41 mg/dL — ABNORMAL HIGH (ref 6–24)
Bilirubin Total: <0.2 mg/dL (ref 0.0–1.2)
CO2: 21 mmol/L (ref 20–29)
Calcium: 8.6 mg/dL — ABNORMAL LOW (ref 8.7–10.2)
Chloride: 105 mmol/L (ref 96–106)
Creatinine, Ser: 2.04 mg/dL — ABNORMAL HIGH (ref 0.57–1.00)
Globulin, Total: 2.9 g/dL (ref 1.5–4.5)
Glucose: 207 mg/dL — ABNORMAL HIGH (ref 65–99)
Potassium: 4.8 mmol/L (ref 3.5–5.2)
Sodium: 138 mmol/L (ref 134–144)
Total Protein: 6.2 g/dL (ref 6.0–8.5)
eGFR: 31 mL/min/{1.73_m2} — ABNORMAL LOW (ref >59)

## 2021-08-07 LAB — PROTIME-INR
INR: 1 (ref 0.9–1.2)
Prothrombin Time: 10.2 s (ref 9.1–12.0)

## 2021-08-07 NOTE — Telephone Encounter (Signed)
Patient is scheduled for 08/17/21 at 9:30 with [facility] for nexplanon replacement and pap

## 2021-08-07 NOTE — Telephone Encounter (Signed)
[facility] referring for for new Nexplanon patient and pap due. Called and left voicemail for patient to call back to be scheduled.

## 2021-08-07 NOTE — Progress Notes (Signed)
Contacted via MyChart   Good morning Kia, most of your labs have returned.  It was great to see you yesterday.  I am still waiting on ferritin level and will let you know when this returns if abnormal. Currently: - Cholesterol levels remain stable -- continue your Praluent - Kidney function, creatinine and eGFR, continues to show kidney disease stage 4, but this is a bit better then recent labs with nephrology.  Continue to keep visits with kidney provider and current medications. - CBC continues to show mild anemia -- hemoglobin a little low, but hematocrit normal.  Iron level is normal.  Overall anemia looks a little better on these labs. - Clotting labs are normal -- suspect the easier bruising as we discussed is more from your current medications, but I want you to continue all current regimen.  Any questions? If you do not hear from dietician or gynecology over next week let me know. Keep being amazing!!  Thank you for allowing me to participate in your care.  I appreciate you. Kindest regards, Kami Kube

## 2021-08-09 NOTE — Telephone Encounter (Signed)
Noted. Nexplanon reserved for this patient. 

## 2021-08-16 ENCOUNTER — Encounter: Payer: Self-pay | Admitting: Cardiovascular Disease

## 2021-08-16 ENCOUNTER — Ambulatory Visit (INDEPENDENT_AMBULATORY_CARE_PROVIDER_SITE_OTHER): Payer: Medicare Other | Admitting: Cardiovascular Disease

## 2021-08-16 ENCOUNTER — Other Ambulatory Visit: Payer: Self-pay

## 2021-08-16 VITALS — BP 138/78 | HR 74 | Ht 65.0 in | Wt 263.4 lb

## 2021-08-16 DIAGNOSIS — I5032 Chronic diastolic (congestive) heart failure: Secondary | ICD-10-CM

## 2021-08-16 DIAGNOSIS — I10 Essential (primary) hypertension: Secondary | ICD-10-CM

## 2021-08-16 DIAGNOSIS — I255 Ischemic cardiomyopathy: Secondary | ICD-10-CM

## 2021-08-16 DIAGNOSIS — E785 Hyperlipidemia, unspecified: Secondary | ICD-10-CM | POA: Diagnosis not present

## 2021-08-16 DIAGNOSIS — I2510 Atherosclerotic heart disease of native coronary artery without angina pectoris: Secondary | ICD-10-CM | POA: Diagnosis not present

## 2021-08-16 DIAGNOSIS — N1831 Chronic kidney disease, stage 3a: Secondary | ICD-10-CM

## 2021-08-16 MED ORDER — FUROSEMIDE 20 MG PO TABS: ORAL_TABLET | ORAL | 3 refills | Status: DC

## 2021-08-16 MED ORDER — EZETIMIBE 10 MG PO TABS: 10.0000 mg | ORAL_TABLET | Freq: Every day | ORAL | 3 refills | Status: DC

## 2021-08-16 NOTE — Progress Notes (Signed)
Venita Lick, NP   Chief Complaint  Patient presents with   Contraception    Nexplanon insertion   Pap only    HPI:      [name] is a 40 y.o. [id number] whose LMP was Patient's last menstrual period was 08/11/2021 (approximate)., presents today for NP pap smear and nexplanon replacement, referred by PCP. Nexplanon placed 2017. Pt amenorrheic with device and no dysmen. Since expiration, has occas bleeding lasting a few days to a few hrs, with dysmen, not improved with tylenol (can't take other meds). She is sex active, no bleeding. Has dryness with some improvement with lubricants.  She is due for pap smear. Last pap ~2017; no hx of abn paps.    Past Medical History:  Diagnosis Date   CAD (coronary artery disease)    a. 03/2015 NSTEMI/PCI: LCX 168m(DES), OM1 100p (DES - vessel labeled Ramus in subsequent cath report). Minor irregs to LAD/RCA; b. 04/2015 Cath: LM nl, LAD 40p, RI patent stent, LCX patent stent, RCA nl, EF 55-65%; c. 02/2016 MV: basal inferolateral and mid inferolateral defect/infarct w/o ischemia. EF 55-65%; d. 11/2018 PCI: LM 30ost, LAD 30p, LCX 95p/m (PTCA->20%), OM1 10ost, OM2 40ost, RCA 85p.   Chronic abdominal pain    Chronic diastolic (congestive) heart failure ([place]    a. 03/2015 Echo: EF 30-35%, mild concentric LVH, severe HK of inf, inflat, & lat walls, mod MR, mild TR;  b. 04/2015 LV Gram: EF 55-65%; c. 09/2017 Echo: EF 55-60%, no rwma. Mild MR; d. 11/2018 Echo: Ef 60-65%, no rwma.   CKD (chronic kidney disease), stage III ([place]    Diabetes type 2, controlled ([place]    a. since 2002    Diabetic gastroparesis (HCC)    a. chronic nausea/vomiting.   Diabetic retinopathy ([place]    Diverticulosis, sigmoid 07/2016   Fatty liver    GERD (gastroesophageal reflux disease)    Heavy menses    a. H/O IUD - expired in 2014 - remains in place.   Hx of migraines    Hyperlipidemia    Intolerant of atorvastatin, rosuvastatin   Hypertension    Iron deficiency  anemia    Ischemic cardiomyopathy (resolved)    a. 03/2015 EF 30-35% post NSTEMI;  b. 04/2015 EF 55-65% on LV gram; c. 09/2017 Echo: EF 55-60%; d. 11/2018 Echo: Ef 60-65%.   Obesity    Tobacco abuse    a. quit 03/2015.   Uterine fibroid    Vertigo    Vitamin D deficiency     Past Surgical History:  Procedure Laterality Date   APPENDECTOMY     CARDIAC CATHETERIZATION  4/16   [place]2 [place] 05/[place]/2015   Procedure: Left Heart Cath and Coronary Angiography;  Surgeon: Peter M JMartinique MD;  [facility];  Service: Cardiovascular;  Laterality: N/A;   COLONOSCOPY WITH PROPOFOL N/A 07/31/2016   Procedure: COLONOSCOPY WITH PROPOFOL;  Surgeon: MLollie Sails MD;  [facility];  Service: Endoscopy;  Laterality: N/A;   CORONARY BALLOON ANGIOPLASTY N/A 12/04/2018   Procedure: CORONARY BALLOON ANGIOPLASTY;  Surgeon: AWellington Hampshire MD;  [facility];  Service: Cardiovascular;  Laterality: N/A;   CORONARY STENT INTERVENTION N/A 01/12/2019   Procedure: CORONARY STENT INTERVENTION;  Surgeon: ENelva Bush MD;  [facility];  Service: Cardiovascular;  Laterality: N/A;   ESOPHAGOGASTRODUODENOSCOPY (EGD) WITH PROPOFOL N/A 07/31/2016   Procedure: ESOPHAGOGASTRODUODENOSCOPY (EGD) WITH PROPOFOL;  Surgeon: Lollie Sails, MD;  [facility] END[facility];  Service: Endoscopy;  Laterality: N/A;   INTRAVASCULAR PRESSURE WIRE/FFR STUDY N/A 12/04/2018   Procedure: INTRAVASCULAR PRESSURE WIRE/FFR STUDY;  Surgeon: Wellington Hampshire, MD;  Location: [place] [facility] LAB;  Service: Cardiovascular;  Laterality: N/A;   LAPAROSCOPIC APPENDECTOMY N/A 08/07/2016   Procedure: APPENDECTOMY LAPAROSCOPIC;  Surgeon: Hubbard Robinson, MD;  Location: [facility] ORS;  Service: General;  Laterality: N/A;   LEFT HEART CATH AND CORONARY ANGIOGRAPHY Left 12/04/2018   Procedure: LEFT HEART CATH AND CORONARY ANGIOGRAPHY;  Surgeon: Wellington Hampshire,  MD;  Location: [place] [facility] LAB;  Service: Cardiovascular;  Laterality: Left;   LEFT HEART CATH AND CORONARY ANGIOGRAPHY N/A 01/12/2019   Procedure: LEFT HEART CATH AND CORONARY ANGIOGRAPHY;  Surgeon: [name], MD;  Location: [place] [facility] LAB;  Service: Cardiovascular;  Laterality: N/A;   MOUTH SURGERY     RIGHT/LEFT HEART CATH AND CORONARY ANGIOGRAPHY N/A 01/29/2021   Procedure: RIGHT/LEFT HEART CATH AND CORONARY ANGIOGRAPHY;  Surgeon: Wellington Hampshire, MD;  Location: [place] [facility] LAB;  Service: Cardiovascular;  Laterality: N/A;    Family History  Problem Relation Age of Onset   Diabetes Mother    Diabetes Father    Cancer - Cervical Maternal Aunt    Cancer Maternal Grandmother        lung   Cancer Maternal Grandfather        prostate   Diabetes Paternal Grandmother    Diabetes Paternal Grandfather     Social History   Socioeconomic History   Marital status: Married    Spouse name: Loss adjuster, chartered   Number of children: Not on file   Years of education: Not on file   Highest education level: Not on file  Occupational History   Not on file  Tobacco Use   Smoking status: Former    Years: 15.00    Types: Cigarettes    Quit date: 03/10/2015    Years since quitting: 6.4   Smokeless tobacco: Never  Vaping Use   Vaping Use: Never used  Substance and Sexual Activity   Alcohol use: No    Alcohol/week: 0.0 standard drinks   Drug use: Not Currently   Sexual activity: Yes    Birth control/protection: Implant  Other Topics Concern   Not on file  Social History Narrative   Lives locally with daughter and husband.   Social Determinants of Health   Financial Resource Strain: Medium Risk   Difficulty of Paying Living Expenses: Somewhat hard  Food Insecurity: No Food Insecurity   Worried About Charity fundraiser in the Last Year: Never true   Ran Out of Food in the Last Year: Never true  Transportation Needs: No Transportation Needs   Lack of Transportation (Medical):  No   Lack of Transportation (Non-Medical): No  Physical Activity: Insufficiently Active   Days of Exercise per Week: 7 days   Minutes of Exercise per Session: 20 min  Stress: Stress Concern Present   Feeling of Stress : To some extent  Social Connections: Not on file  Intimate Partner Violence: Not on file    Outpatient Medications Prior to Visit  Medication Sig Dispense Refill   ACCU-CHEK AVIVA PLUS test strip USE 1 STRIP TO CHECK GLUCOSE THREE TIMES DAILY     acetaminophen (TYLENOL) 500 MG tablet Take 1,000 mg by mouth every 4 (four) hours as needed.     albuterol (VENTOLIN HFA) 108 (90 Base) MCG/ACT inhaler Inhale into the  lungs.     Alirocumab (PRALUENT) 150 MG/ML SOAJ Inject 1 mL into the skin every 14 (fourteen) days. 2 mL 11   aspirin EC 81 MG tablet Take 81 mg by mouth daily.     BD PEN NEEDLE MICRO U/F 32G X 6 MM MISC Inject into the skin.     benzonatate (TESSALON) 100 MG capsule Take 1 capsule (100 mg total) by mouth 2 (two) times daily as needed for cough. 30 capsule 12   clopidogrel (PLAVIX) 75 MG tablet Take 1 tablet (75 mg total) by mouth daily. 90 tablet 3   Continuous Blood Gluc Receiver (DEXCOM G6 RECEIVER) DEVI 1 PER 5 YEARS     ezetimibe (ZETIA) 10 MG tablet Take 1 tablet (10 mg total) by mouth daily. 90 tablet 3   famotidine (PEPCID) 40 MG tablet Take 1 tablet (40 mg total) by mouth daily. 60 tablet 0   fluticasone (FLONASE) 50 MCG/ACT nasal spray Place 2 sprays into both nostrils daily. 15.8 mL 2   furosemide (LASIX) 20 MG tablet Takes 20 mg tablet qod and 40 mg on the other days. 90 tablet 3   insulin aspart (NOVOLOG) 100 UNIT/ML injection Inject 10 Units into the skin 3 (three) times daily with meals. 10 mL 11   Insulin Glargine (BASAGLAR KWIKPEN) 100 UNIT/ML Inject 70 Units into the skin daily.     irbesartan (AVAPRO) 75 MG tablet Take 1 tablet (75 mg total) by mouth daily. 90 tablet 3   isosorbide mononitrate (IMDUR) 120 MG 24 hr tablet Take 1 tablet (120 mg  total) by mouth daily. 90 tablet 3   LOKELMA 10 g PACK packet Take 10 g by mouth 3 (three) times a week.     loperamide (IMODIUM) 2 MG capsule Take 2 mg by mouth as needed for diarrhea or loose stools.     meclizine (ANTIVERT) 25 MG tablet Take 25 mg by mouth 3 (three) times daily as needed for dizziness.     nitroGLYCERIN (NITROSTAT) 0.4 MG SL tablet Take 1 tab under your tongue while sitting for chest pain, If no relief may repeat, one tab every 5 min up to 3 tabs total over 15 mins. 25 tablet 3   pantoprazole (PROTONIX) 40 MG tablet Take 1 tablet (40 mg total) by mouth 2 (two) times daily. 60 tablet 0   promethazine (PHENERGAN) 12.5 MG tablet TAKE 1 TABLET BY MOUTH EVERY 6 HOURS AS NEEDED FOR NAUSEA AND VOMITING 30 tablet 2   etonogestrel (NEXPLANON) 68 MG IMPL implant 1 each by Subdermal route once.     carvedilol (COREG) 25 MG tablet Take 1 tablet (25 mg total) by mouth 2 (two) times daily. 180 tablet 3   BINAXNOW COVID-19 AG HOME TEST KIT Use as Directed on the Package     Facility-Administered Medications Prior to Visit  Medication Dose Route Frequency Provider Last Rate Last Admin   sodium chloride flush (NS) 0.9 % injection 3 mL  3 mL Intravenous Q12H Visser, Jacquelyn D, PA-C          ROS:  Review of Systems  Constitutional:  Negative for fever.  Gastrointestinal:  Negative for blood in stool, constipation, diarrhea, nausea and vomiting.  Genitourinary:  Negative for dyspareunia, dysuria, flank pain, frequency, hematuria, urgency, vaginal bleeding, vaginal discharge and vaginal pain.  Musculoskeletal:  Negative for back pain.  Skin:  Negative for rash.  BREAST: No symptoms   OBJECTIVE:   Vitals:  BP (!) 150/80   Ht _0  (  1.651 m)   Wt 263 lb (119.3 kg)   LMP 08/11/2021 (Approximate)   BMI 43.77 kg/m   Physical Exam Vitals reviewed.  Constitutional:      Appearance: She is well-developed.  Pulmonary:     Effort: Pulmonary effort is normal.  Genitourinary:     General: Normal vulva.     Pubic Area: No rash.      Labia:        Right: No rash, tenderness or lesion.        Left: No rash, tenderness or lesion.      Vagina: Normal. No vaginal discharge, erythema or tenderness.     Cervix: Normal.     Uterus: Normal. Not enlarged and not tender.      Adnexa: Right adnexa normal and left adnexa normal.       Right: No mass or tenderness.         Left: No mass or tenderness.    Musculoskeletal:        General: Normal range of motion.     Cervical back: Normal range of motion.  Skin:    General: Skin is warm and dry.  Neurological:     General: No focal deficit present.     Mental Status: She is alert and oriented to person, place, and time.  Psychiatric:        Mood and Affect: Mood normal.        Behavior: Behavior normal.        Thought Content: Thought content normal.        Judgment: Judgment normal.    Nexplanon removal Procedure note - The Nexplanon was noted in the patient's arm and the end was identified. The skin was cleansed with a Betadine solution. A small injection of subcutaneous lidocaine with epinephrine was given over the end of the implant. An incision was made at the end of the implant. The rod was noted in the incision and grasped with a hemostat. It was noted to be intact.  Steri-Strip was placed approximating the incision. Hemostasis was noted.  Nexplanon Insertion  Patient given informed consent, signed copy in the chart, time out was performed.  Appropriate time out taken.  Patient's LEFT arm was prepped and draped in the usual sterile fashion. The ruler used to measure and mark insertion area.  Pt was prepped with betadine swab and then injected with 1.0 cc of 2% lidocaine with epinephrine. Nexplanon removed form packaging,  Device confirmed in needle, then inserted full length of needle and withdrawn per handbook instructions.  Pt insertion site covered with steri-strip and a bandage.   Minimal blood loss.  Pt tolerated  the procedure well.   Assessment/Plan: Cervical cancer screening - Plan: Cytology - PAP  Screening for HPV (human papillomavirus) - Plan: Cytology - PAP  Encounter for removal and reinsertion of Nexplanon - Plan: etonogestrel (NEXPLANON) 55 MG IMPL implant  She was told to remove the dressing in 12-24 hours, to keep the incision area dry for 24 hours and to remove the Steristrip in 2-3  days.  Notify us if any signs of tenderness, redness, pain, or fevers develop.  Meds ordered this encounter  Medications   etonogestrel (NEXPLANON) 68 MG IMPL implant    Sig: 1 each (68 mg total) by Subdermal route once for 1 dose.    Dispense:  1 each    Refill:  0    Order Specific Question:   Supervising Provider    Answer:  Gae Dry [[id number]]      Return if symptoms worsen or fail to improve.  Edwardine Deschepper B. Raquon Milledge, PA-C 08/17/2021 10:03 AM

## 2021-08-16 NOTE — Patient Instructions (Signed)
Medication Instructions:  Your physician has recommended you make the following change in your medication:   START Zetia 10 mg daily. An Rx has been sent to your pharmacy.   *If you need a refill on your cardiac medications before your next appointment, please call your pharmacy*   Lab Work: Your physician recommends that you return for a FASTING lipid profile and lft in 2 months. (Labs are to be drawn at our office)  If you have labs (blood work) drawn today and your tests are completely normal, you will receive your results only by: [place] (if you have MyChart) OR A paper copy in the mail If you have any lab test that is abnormal or we need to change your treatment, we will call you to review the results.   Testing/Procedures: None ordered   Follow-Up: At [facility], you and your health needs are our priority.  As part of our continuing mission to provide you with exceptional heart care, we have created designated Provider Care Teams.  These Care Teams include your primary Cardiologist (physician) and Advanced Practice Providers (APPs -  Physician Assistants and Nurse Practitioners) who all work together to provide you with the care you need, when you need it.  We recommend signing up for the patient portal called "MyChart".  Sign up information is provided on this After Visit Summary.  MyChart is used to connect with patients for Virtual Visits (Telemedicine).  Patients are able to view lab/test results, encounter notes, upcoming appointments, etc.  Non-urgent messages can be sent to your provider as well.   To learn more about what you can do with MyChart, go to [email or web address].    Your next appointment:   6 month(s)  The format for your next appointment:   In Person  Provider:   You may see Kathlyn Sacramento, MD or one of the following Advanced Practice Providers on your designated Care Team:   Murray Hodgkins, NP Christell Faith, PA-C Marrianne Mood,  PA-C Cadence Kathlen Mody, [place]   Other Instructions N/A

## 2021-08-16 NOTE — Progress Notes (Signed)
Cardiology Office Note   Date:  08/16/2021   ID:  [name], [name] 06/25/1981, MRN [id number]  PCP:  [name] Lick, NP  Cardiologist:   [name] Sacramento, MD   Chief Complaint  Patient presents with   Other    6 month f/u c/o occasional heart flutter and increased edema. Pt nephrologist increased her furosemide 20 mg qod and 40 mg on the other days . Meds reviewed verbally with pt.      History of Present Illness: [name] [name] is a 40 y.o. female who presents for a follow-up regarding coronary artery disease. She has complex past medical history including type 2 diabetes mellitus which was diagnosed 2002, diabetic gastroparesis with chronic nausea and vomiting, coronary artery disease, ischemic cardiomyopathy with subsequent normalization of LV function, stage II-III chronic kidney disease, hypertension, hyperlipidemia, and obesity.  Cardiac history dates back to April 2016, when she was hospitalized with chest pain and non-STEMI.  She was found to have LV dysfunction with an EF of 30 to 35%, and underwent diagnostic catheterization revealing severe left circumflex and OM1 disease.  Both areas were successfully treated with drug-eluting stents.  Follow-up catheterization in May 2016 revealed patency of both stents with normalization of LV function by ventriculography.   Stress testing was performed in March 2017 which was nonischemic and low risk.   She had recurrent angina in December 2019.  Cardiac catheterization showed patent stent in OM1, focal in-stent restenosis in the left circumflex at the origin of OM 2 and new significant stenosis in the proximal to mid right coronary artery.  I performed balloon angioplasty of the mid left circumflex.  She underwent staged RCA PCI with drug-eluting stent placement.  She has known history of severe hyperlipidemia with intolerance to statins.   She has chronic kidney disease followed by nephrology and she has been having issues with  hyperkalemia. Potassium has been above 6 frequently and she is currently on Lokelma.  Right and left cardiac catheterization was done in February 2020.  Due to chest pain and shortness of breath. It showed patent stents in the right coronary artery, left circumflex and OM1.  There was moderate in-stent restenosis in the mid left circumflex at the origin of OM 2.  Right heart catheterization showed normal filling pressures, normal pulmonary pressure and normal cardiac output.  She has been doing well overall with no recent chest pain or worsening dyspnea.  She describes intermittent palpitations but not very frequent.  No tachycardia.  She takes her medications regularly including Praluent every 2 weeks.    Past Medical History:  Diagnosis Date   CAD (coronary artery disease)    a. 03/2015 NSTEMI/PCI: LCX 141m (DES), OM1 100p (DES - vessel labeled Ramus in subsequent cath report). Minor irregs to LAD/RCA; b. 04/2015 Cath: LM nl, LAD 40p, RI patent stent, LCX patent stent, RCA nl, EF 55-65%; c. 02/2016 MV: basal inferolateral and mid inferolateral defect/infarct w/o ischemia. EF 55-65%; d. 11/2018 PCI: LM 30ost, LAD 30p, LCX 95p/m (PTCA->20%), OM1 10ost, OM2 40ost, RCA 85p.   Chronic abdominal pain    Chronic diastolic (congestive) heart failure ([place])    a. 03/2015 Echo: EF 30-35%, mild concentric LVH, severe HK of inf, inflat, & lat walls, mod MR, mild TR;  b. 04/2015 LV Gram: EF 55-65%; c. 09/2017 Echo: EF 55-60%, no rwma. Mild MR; d. 11/2018 Echo: Ef 60-65%, no rwma.   CKD (chronic kidney disease), stage III ([place])    Diabetes type 2, controlled (  [place])    a. since 2002    Diabetic gastroparesis ([place])    a. chronic nausea/vomiting.   Diabetic retinopathy ([place])    Diverticulosis, sigmoid 07/2016   Fatty liver    GERD (gastroesophageal reflux disease)    Heavy menses    a. H/O IUD - expired in 2014 - remains in place.   Hx of migraines    Hyperlipidemia    Intolerant of atorvastatin, rosuvastatin    Hypertension    Iron deficiency anemia    Ischemic cardiomyopathy (resolved)    a. 03/2015 EF 30-35% post NSTEMI;  b. 04/2015 EF 55-65% on LV gram; c. 09/2017 Echo: EF 55-60%; d. 11/2018 Echo: Ef 60-65%.   Obesity    Tobacco abuse    a. quit 03/2015.   Uterine fibroid    Vertigo    Vitamin D deficiency     Past Surgical History:  Procedure Laterality Date   APPENDECTOMY     CARDIAC CATHETERIZATION  4/16   x2 stent [place] N/A 05/05/2015   Procedure: Left Heart Cath and Coronary Angiography;  Surgeon: Peter M Martinique, MD;  Location: [place] [facility];  Service: Cardiovascular;  Laterality: N/A;   COLONOSCOPY WITH PROPOFOL N/A 07/31/2016   Procedure: COLONOSCOPY WITH PROPOFOL;  Surgeon: Lollie Sails, MD;  Location: [facility] ENDOSCOPY;  Service: Endoscopy;  Laterality: N/A;   CORONARY BALLOON ANGIOPLASTY N/A 12/04/2018   Procedure: CORONARY BALLOON ANGIOPLASTY;  Surgeon: Wellington Hampshire, MD;  Location: [place] [facility];  Service: Cardiovascular;  Laterality: N/A;   CORONARY STENT INTERVENTION N/A 01/12/2019   Procedure: CORONARY STENT INTERVENTION;  Surgeon: [name], MD;  Location: [place] [facility] LAB;  Service: Cardiovascular;  Laterality: N/A;   ESOPHAGOGASTRODUODENOSCOPY (EGD) WITH PROPOFOL N/A 07/31/2016   Procedure: ESOPHAGOGASTRODUODENOSCOPY (EGD) WITH PROPOFOL;  Surgeon: Lollie Sails, MD;  Location: [facility] ENDOSCOPY;  Service: Endoscopy;  Laterality: N/A;   INTRAVASCULAR PRESSURE WIRE/FFR STUDY N/A 12/04/2018   Procedure: INTRAVASCULAR PRESSURE WIRE/FFR STUDY;  Surgeon: Wellington Hampshire, MD;  Location: [place] [facility] LAB;  Service: Cardiovascular;  Laterality: N/A;   LAPAROSCOPIC APPENDECTOMY N/A 08/07/2016   Procedure: APPENDECTOMY LAPAROSCOPIC;  Surgeon: Hubbard Robinson, MD;  Location: [facility] ORS;  Service: General;  Laterality: N/A;   LEFT HEART CATH AND CORONARY ANGIOGRAPHY Left 12/04/2018   Procedure: LEFT HEART CATH AND CORONARY  ANGIOGRAPHY;  Surgeon: Wellington Hampshire, MD;  Location: [place] [facility];  Service: Cardiovascular;  Laterality: Left;   LEFT HEART CATH AND CORONARY ANGIOGRAPHY N/A 01/12/2019   Procedure: LEFT HEART CATH AND CORONARY ANGIOGRAPHY;  Surgeon: [name], MD;  Location: [place] [facility];  Service: Cardiovascular;  Laterality: N/A;   MOUTH SURGERY     RIGHT/LEFT HEART CATH AND CORONARY ANGIOGRAPHY N/A 01/29/2021   Procedure: RIGHT/LEFT HEART CATH AND CORONARY ANGIOGRAPHY;  Surgeon: Wellington Hampshire, MD;  Location: [place] [facility] LAB;  Service: Cardiovascular;  Laterality: N/A;     Current Outpatient Medications  Medication Sig Dispense Refill   ACCU-CHEK AVIVA PLUS test strip USE 1 STRIP TO CHECK GLUCOSE THREE TIMES DAILY     acetaminophen (TYLENOL) 500 MG tablet Take 1,000 mg by mouth every 4 (four) hours as needed.     albuterol (VENTOLIN HFA) 108 (90 Base) MCG/ACT inhaler Inhale into the lungs.     Alirocumab (PRALUENT) 150 MG/ML SOAJ Inject 1 mL into the skin every 14 (fourteen) days. 2 mL 11   aspirin EC 81 MG tablet Take 81  mg by mouth daily.     BD PEN NEEDLE MICRO U/F 32G X 6 MM MISC Inject into the skin.     benzonatate (TESSALON) 100 MG capsule Take 1 capsule (100 mg total) by mouth 2 (two) times daily as needed for cough. 30 capsule 12   BINAXNOW COVID-19 AG HOME TEST KIT Use as Directed on the Package     clopidogrel (PLAVIX) 75 MG tablet Take 1 tablet (75 mg total) by mouth daily. 90 tablet 3   Continuous Blood Gluc Receiver (DEXCOM G6 RECEIVER) DEVI 1 PER 5 YEARS     etonogestrel (NEXPLANON) 68 MG IMPL implant 1 each by Subdermal route once.     famotidine (PEPCID) 40 MG tablet Take 1 tablet (40 mg total) by mouth daily. 60 tablet 0   fluticasone (FLONASE) 50 MCG/ACT nasal spray Place 2 sprays into both nostrils daily. 15.8 mL 2   furosemide (LASIX) 20 MG tablet Take 1 tablet (20 mg total) by mouth daily. (Patient taking differently: Takes 20 mg tablet qod and 40 mg  on the other days.) 90 tablet 3   insulin aspart (NOVOLOG) 100 UNIT/ML injection Inject 10 Units into the skin 3 (three) times daily with meals. 10 mL 11   Insulin Glargine (BASAGLAR KWIKPEN) 100 UNIT/ML Inject 70 Units into the skin daily.     irbesartan (AVAPRO) 75 MG tablet Take 1 tablet (75 mg total) by mouth daily. 90 tablet 3   isosorbide mononitrate (IMDUR) 120 MG 24 hr tablet Take 1 tablet (120 mg total) by mouth daily. 90 tablet 3   LOKELMA 10 g PACK packet Take 10 g by mouth 3 (three) times a week.     loperamide (IMODIUM) 2 MG capsule Take 2 mg by mouth as needed for diarrhea or loose stools.     meclizine (ANTIVERT) 25 MG tablet Take 25 mg by mouth 3 (three) times daily as needed for dizziness.     nitroGLYCERIN (NITROSTAT) 0.4 MG SL tablet Take 1 tab under your tongue while sitting for chest pain, If no relief may repeat, one tab every 5 min up to 3 tabs total over 15 mins. 25 tablet 3   pantoprazole (PROTONIX) 40 MG tablet Take 1 tablet (40 mg total) by mouth 2 (two) times daily. 60 tablet 0   promethazine (PHENERGAN) 12.5 MG tablet TAKE 1 TABLET BY MOUTH EVERY 6 HOURS AS NEEDED FOR NAUSEA AND VOMITING 30 tablet 2   carvedilol (COREG) 25 MG tablet Take 1 tablet (25 mg total) by mouth 2 (two) times daily. 180 tablet 3   No current facility-administered medications for this visit.   Facility-Administered Medications Ordered in Other Visits  Medication Dose Route Frequency Provider Last Rate Last Admin   sodium chloride flush (NS) 0.9 % injection 3 mL  3 mL Intravenous Q12H Visser, Jacquelyn D, PA-C        Allergies:   Lisinopril, Other, Rosemary oil, Shellfish allergy, Statins, and Metformin and related    Social History:  The patient  reports that she quit smoking about 6 years ago. Her smoking use included cigarettes. She has never used smokeless tobacco. She reports that she does not currently use drugs. She reports that she does not drink alcohol.   Family History:  The  patient's family history includes Cancer in her maternal grandfather and maternal grandmother; Cancer - Cervical in her maternal aunt; Diabetes in her father, mother, paternal grandfather, and paternal grandmother.    ROS:  Please see the history of  present illness.   Otherwise, review of systems are positive for none.   All other systems are reviewed and negative.    PHYSICAL EXAM: VS:  BP 138/78 (BP Location: Left Arm, Patient Position: Sitting, Cuff Size: Normal)   Pulse 74   Ht $R'5\' 5"'Sg$  (1.651 m)   Wt 263 lb 6 oz (119.5 kg)   SpO2 96%   BMI 43.83 kg/m  , BMI Body mass index is 43.83 kg/m. GEN: Well nourished, well developed, in no acute distress  HEENT: normal  Neck: no JVD, carotid bruits, or masses Cardiac:Mildly tachycardic; no murmurs, rubs, or gallops,no edema  Respiratory:  clear to auscultation bilaterally, normal work of breathing GI: soft, nontender, nondistended, + BS MS: no deformity or atrophy  Skin: warm and dry, no rash Neuro:  Strength and sensation are intact Psych: euthymic mood, full affect Right radial pulses normal with no hematoma.  EKG:  EKG is  ordered today. EKG showed normal sinus rhythm with no significant ST or T wave changes.    Recent Labs: 08/06/2021: ALT 18; BUN 41; Creatinine, Ser 2.04; Hemoglobin 11.0; Platelets 366; Potassium 4.8; Sodium 138    Lipid Panel    Component Value Date/Time   CHOL 154 08/06/2021 1510   CHOL 106 08/30/2015 1439   CHOL 133 03/31/2015 0225   TRIG 105 08/06/2021 1510   TRIG 46 08/30/2015 1439   TRIG 77 03/31/2015 0225   HDL 42 08/06/2021 1510   HDL 26 (L) 03/31/2015 0225   CHOLHDL 3.1 02/09/2021 0852   VLDL 9 08/30/2015 1439   VLDL 15 03/31/2015 0225   LDLCALC 93 08/06/2021 1510   LDLCALC 92 03/31/2015 0225   LDLDIRECT 163 (H) 12/17/2018 1145      Wt Readings from Last 3 Encounters:  08/16/21 263 lb 6 oz (119.5 kg)  08/06/21 262 lb 3.2 oz (118.9 kg)  02/09/21 246 lb (111.6 kg)        ASSESSMENT  AND PLAN:  1.  Coronary artery disease with other forms of angina: She is doing well at the present time with controlled symptoms.   Continue medical therapy.     2.  Chronic diastolic congestive heart failure: She appears to be euvolemic on current dose of furosemide 20 mg daily.  Recent right heart catheterization showed normal filling pressures.   3.  Essential hypertension: Blood pressure is well controlled on current medication.  4.  Hyperlipidemia: Intolerant to statins due to myalgia.  She is taking maximal dose Praluent on a regular basis.  I reviewed most recent lipid profile with her which was done in August and showed an LDL of 93.  We need to get her LDL below 70.  Thus, I elected to add Zetia 10 mg daily.  Repeat lipid and liver profile in 2 months.  5.  CKD III: Stable and followed by nephrology.      Disposition:   FU with me or APP in 6 months  Signed,  [name] Sacramento, MD  08/16/2021 1:35 PM    [place]

## 2021-08-17 ENCOUNTER — Other Ambulatory Visit (HOSPITAL_COMMUNITY)
Admission: RE | Admit: 2021-08-17 | Discharge: 2021-08-17 | Disposition: A | Discharge status: Home / Self Care | Payer: Medicare Other | Source: Ambulatory Visit | Attending: Obstetrics and Gynecology | Admitting: Obstetrics and Gynecology

## 2021-08-17 ENCOUNTER — Encounter: Payer: Self-pay | Admitting: Obstetrics and Gynecology

## 2021-08-17 ENCOUNTER — Ambulatory Visit (INDEPENDENT_AMBULATORY_CARE_PROVIDER_SITE_OTHER): Payer: Medicare Other | Admitting: Obstetrics and Gynecology

## 2021-08-17 VITALS — BP 150/80 | Ht 65.0 in | Wt 263.0 lb

## 2021-08-17 DIAGNOSIS — Z124 Encounter for screening for malignant neoplasm of cervix: Secondary | ICD-10-CM | POA: Insufficient documentation

## 2021-08-17 DIAGNOSIS — Z1151 Encounter for screening for human papillomavirus (HPV): Secondary | ICD-10-CM | POA: Diagnosis not present

## 2021-08-17 DIAGNOSIS — Z3046 Encounter for surveillance of implantable subdermal contraceptive: Secondary | ICD-10-CM | POA: Diagnosis not present

## 2021-08-17 DIAGNOSIS — Z01419 Encounter for gynecological examination (general) (routine) without abnormal findings: Secondary | ICD-10-CM | POA: Insufficient documentation

## 2021-08-17 MED ORDER — ETONOGESTREL 68 MG ~~LOC~~ IMPL: 1.0000 | DRUG_IMPLANT | Freq: Once | SUBCUTANEOUS | 0 refills | Status: AC

## 2021-08-17 NOTE — Patient Instructions (Signed)
I value your feedback and you entrusting us with your care. If you get a [facility] patient survey, I would appreciate you taking the time to let us know about your experience today. Thank you!  Remove the dressing in 24 hours,  keep the incision area dry for 24 hours and remove the Steristrip in 2-3  days.  Notify us if any signs of tenderness, redness, pain, or fevers develop.   

## 2021-08-23 LAB — CYTOLOGY - PAP
Comment: NEGATIVE
Diagnosis: NEGATIVE
Diagnosis: REACTIVE
High risk HPV: NEGATIVE

## 2021-08-27 DIAGNOSIS — H25811 Combined forms of age-related cataract, right eye: Secondary | ICD-10-CM | POA: Diagnosis not present

## 2021-08-27 DIAGNOSIS — H269 Unspecified cataract: Secondary | ICD-10-CM | POA: Diagnosis not present

## 2021-08-27 DIAGNOSIS — Z01818 Encounter for other preprocedural examination: Secondary | ICD-10-CM | POA: Diagnosis not present

## 2021-09-12 DIAGNOSIS — H2512 Age-related nuclear cataract, left eye: Secondary | ICD-10-CM | POA: Diagnosis not present

## 2021-09-12 DIAGNOSIS — H269 Unspecified cataract: Secondary | ICD-10-CM | POA: Diagnosis not present

## 2021-09-12 HISTORY — PX: CATARACT EXTRACTION: SUR2

## 2021-09-15 ENCOUNTER — Other Ambulatory Visit: Payer: Self-pay | Admitting: Nurse Practitioner

## 2021-09-15 NOTE — Telephone Encounter (Signed)
Requested Prescriptions  Pending Prescriptions Disp Refills  . famotidine (PEPCID) 40 MG tablet [Pharmacy Med Name: Famotidine 40 MG Oral Tablet] 22 tablet 0    Sig: Take 1 tablet by mouth once daily     Gastroenterology:  H2 Antagonists Passed - 09/15/2021  6:43 AM      Passed - Valid encounter within last 12 months    Recent Outpatient Visits          1 month ago Poorly controlled type 2 diabetes mellitus ([place])   [place], Henrine Screws T, NP   1 year ago Dizziness   [place], [place], [place]   2 years ago Diabetic gastroparesis associated with type 2 diabetes mellitus ([place])   [place], Olla T, NP   2 years ago Cannabis hyperemesis syndrome concurrent with and due to cannabis abuse ([place])   [place], Megan P, DO   2 years ago Diabetic gastroparesis associated with type 2 diabetes mellitus ([place])   [place], Barbaraann Faster, NP      Future Appointments            In 4 days  [facility], [place]   In 1 month [place], Barbaraann Faster, NP [facility], [place]   In 5 months [place], Mertie Clause, MD [facility], [facility]           . pantoprazole (PROTONIX) 40 MG tablet [Pharmacy Med Name: Pantoprazole Sodium 40 MG Oral Tablet Delayed Release] 60 tablet 0    Sig: Take 1 tablet by mouth twice daily     Gastroenterology: Proton Pump Inhibitors Passed - 09/15/2021  6:43 AM      Passed - Valid encounter within last 12 months    Recent Outpatient Visits          1 month ago Poorly controlled type 2 diabetes mellitus ([place])   [place], Henrine Screws T, NP   1 year ago Dizziness   [place], [place], [place]   2 years ago Diabetic gastroparesis associated with type 2 diabetes mellitus ([place])   [place], Santa Claus T, NP   2 years ago Cannabis hyperemesis syndrome concurrent with and due to  cannabis abuse ([place])   [place], Megan P, DO   2 years ago Diabetic gastroparesis associated with type 2 diabetes mellitus ([place])   [place], Barbaraann Faster, NP      Future Appointments            In 4 days  [facility], [place]   In 1 month [place], Barbaraann Faster, NP [facility], [place]   In 5 months Ar[place], Mertie Clause, MD [facility], [facility]

## 2021-09-19 ENCOUNTER — Ambulatory Visit: Payer: Medicare Other

## 2021-09-19 ENCOUNTER — Other Ambulatory Visit: Payer: Self-pay

## 2021-09-19 ENCOUNTER — Encounter: Payer: Self-pay | Admitting: Dietician

## 2021-09-19 ENCOUNTER — Encounter: Payer: Medicare Other | Attending: Internal Medicine | Admitting: Dietician

## 2021-09-19 VITALS — Ht 65.0 in | Wt 267.5 lb

## 2021-09-19 DIAGNOSIS — E1169 Type 2 diabetes mellitus with other specified complication: Secondary | ICD-10-CM | POA: Insufficient documentation

## 2021-09-19 DIAGNOSIS — N183 Chronic kidney disease, stage 3 unspecified: Secondary | ICD-10-CM | POA: Diagnosis not present

## 2021-09-19 DIAGNOSIS — E1122 Type 2 diabetes mellitus with diabetic chronic kidney disease: Secondary | ICD-10-CM | POA: Diagnosis not present

## 2021-09-19 DIAGNOSIS — I509 Heart failure, unspecified: Secondary | ICD-10-CM | POA: Insufficient documentation

## 2021-09-19 DIAGNOSIS — I130 Hypertensive heart and chronic kidney disease with heart failure and stage 1 through stage 4 chronic kidney disease, or unspecified chronic kidney disease: Secondary | ICD-10-CM | POA: Insufficient documentation

## 2021-09-19 DIAGNOSIS — E785 Hyperlipidemia, unspecified: Secondary | ICD-10-CM | POA: Diagnosis not present

## 2021-09-19 NOTE — Patient Instructions (Signed)
Plan to eat small meals and have something to eat every 3-5 hours during the day. Start with eating at least 2-3 times a day and progress to eating every 3-5 hours. Continue to choose low fat foods.  Keep any meat portions to palm-size (3oz) or less.  Limit fluids while eating to avoid feeling overly full.  Have a low sugar smoothie that has some protein in place of a meal when not feeling hungry.  Control carbs to 30-45 grams with meals.  Avoid large amounts of leafy green vegetables, potatoes, bananas as they are high in potassium.  Choose low sodium foods.Ideally keep to average of 500mg  with each meal. Use herb based seasonings like Mrs Deliah [place] or [place] Perfect Pinch. Do not use salt subs like Salt Sense or NoSalt they are high in potassium.

## 2021-09-19 NOTE — Progress Notes (Signed)
Medical Nutrition Therapy: Visit start time: 1100  end time: 1200  Assessment:  Diagnosis: Type 2 diabetes, CKD Stage 3, hyperlipidemia, obesity Past medical history: gastroparesis, GERD, diverticulosis, CHF, NAFLD, sleep apnea Psychosocial issues/ stress concerns: none  Preferred learning method:  Auditory Visual Hands-on   Current weight: 267.5lbs Height: 5'5" BMI: 44.51 Medications, supplements: reconciled list in medical record  Progress and evaluation:  Patient reports practicing carb counting, not using carb:insulin ratio. She adjusts dose based on pre-meal BG reading.  Recent HbA1C improved to 7.4% (07/04/21), patient reports recent BG readings are on par with 6.7% A1C. Recent GFR at 31 (08/06/21). Weight fluctuates easily due to fluid retention; she is trying to drink enough fluid without overconsuming. Has allergic reaction or intolerance to cucumbers, tomatoes, milk, [place] seasoning, rosemary, and sometimes eggs She reports poor appetite, often feels bloated and full after eating, occasionally nausea and vomiting. This has led to infrequent meals.  Physical activity: occasional walking  Dietary Intake:  Usual eating pattern includes 1-2 meals and 0 snacks per day. Dining out frequency: 3-5 meals per week.  Breakfast: Eng muffin, [place] bacon, boiled egg; sometimes skips or eats late meal Snack: none Lunch: sometimes skips; salad, wrap, sub; rarely larger meal; avoids fried foods Snack: none Supper: seared/ sauteed chicken/ fish/ shrimp (small size, itches with larger size shrimp) + veg Snack: none Beverages: water 8+ glasses daily  Nutrition Care Education: Topics covered:  Basic nutrition: basic food groups, appropriate nutrient balance, appropriate meal and snack schedule, general nutrition guidelines    Weight control: importance of low sugar and low fat choices; portion control  estimated energy needs at 1400 kcal, provided guidance for 50% CHO, 20% pro, 30%  fat Diabetes: appropriate meal and snack schedule, appropriate carb intake and balance, healthy carb choices, role of fiber, protein, fat Hypertension:  identifying high sodium foods, goals for sodium intake at meals CKD: controlling portions of protein foods; importance of low sodium diet; avoiding large amounts of high potassium foods with history of elevated potassium Gastroparesis: eating small, frequent meals/ snacks; avoiding high fat, spicy foods, high fiber foods; chewing foods well and eating slowly; avoiding carbonated beverages and large amounts of fluids while eating  Nutritional Diagnosis:  [place]-2.2 Altered nutrition-related laboratory As related to type 2 diabetes, kidney disease.  As evidenced by elevated HbA1C, low GFR, . [place]-1.4 Altered GI function As related to gastroparesis.  As evidenced by patient reported GI symptoms after meals. [place]-3.3 Overweight/obesity As related to history of excess calories, limited physical activity.  As evidenced by patient with current BMI of 41.5.  Intervention:  Instruction and discussion as noted above. Patient has been working to improve her diet with better control of carb intake, low sodium choices, and adequate fluids. Main nutrition focus at this time is managing GI symptoms to allow for more balanced and consistent eating pattern.  Established some additional goals with direction from patient. Patient elected not to schedule MNT follow up; she will schedule at a later time if needed.  Education Materials given:  Museum/gallery conservator with food lists, sample meal pattern Gastroparesis Nutrition Therapy Visit summary with goals/ instructions   Learner/ who was taught:  Patient   Level of understanding: Verbalizes/ demonstrates competency  Demonstrated degree of understanding via:   Teach back Learning barriers: None  Willingness to learn/ readiness for change: Eager, change in progress   Monitoring and Evaluation:  Dietary intake,  exercise, BG control, renal function, GI symptoms, and body weight  follow up: prn

## 2021-09-21 ENCOUNTER — Other Ambulatory Visit: Payer: Self-pay | Admitting: Nurse Practitioner

## 2021-09-21 MED ORDER — FAMOTIDINE 40 MG PO TABS: 40.0000 mg | ORAL_TABLET | Freq: Every day | ORAL | 4 refills | Status: DC

## 2021-09-24 ENCOUNTER — Ambulatory Visit (INDEPENDENT_AMBULATORY_CARE_PROVIDER_SITE_OTHER): Payer: Medicare Other

## 2021-09-24 VITALS — Ht 65.0 in | Wt 263.0 lb

## 2021-09-24 DIAGNOSIS — Z1231 Encounter for screening mammogram for malignant neoplasm of breast: Secondary | ICD-10-CM | POA: Diagnosis not present

## 2021-09-24 DIAGNOSIS — Z0000 Encounter for general adult medical examination without abnormal findings: Secondary | ICD-10-CM | POA: Diagnosis not present

## 2021-09-24 NOTE — Progress Notes (Signed)
I connected with [name] today by telephone and verified that I am speaking with the correct person using two identifiers. Location patient: home Location provider: work Persons participating in the virtual visit: Jersie, Beel LPN.   I discussed the limitations, risks, security and privacy concerns of performing an evaluation and management service by telephone and the availability of in person appointments. I also discussed with the patient that there may be a patient responsible charge related to this service. The patient expressed understanding and verbally consented to this telephonic visit.    Interactive audio and video telecommunications were attempted between this provider and patient, however failed, due to patient having technical difficulties OR patient did not have access to video capability.  We continued and completed visit with audio only.     Vital signs may be patient reported or missing.  Subjective:   [name] [name] is a 40 y.o. female who presents for Medicare Annual (Subsequent) preventive examination.  Review of Systems     Cardiac Risk Factors include: diabetes mellitus;dyslipidemia;hypertension;obesity (BMI >30kg/m2);sedentary lifestyle     Objective:    Today's Vitals   09/24/21 1511 09/24/21 1512  Weight: 263 lb (119.3 kg)   Height: 5\' 5"  (1.651 m)   PainSc:  1    Body mass index is 43.77 kg/m.  Advanced Directives 09/24/2021 09/19/2021 10/11/2020 10/08/2020 09/18/2020 11/20/2019 07/23/2019  Does Patient Have a Medical Advance Directive? No No No No No No No  Would patient like information on creating a medical advance directive? - Yes (MAU/Ambulatory/Procedural Areas - Information given) - No - Patient declined - No - Patient declined No - Patient declined    Current Medications (verified) Outpatient Encounter Medications as of 09/24/2021  Medication Sig   ACCU-CHEK AVIVA PLUS test strip USE 1 STRIP TO CHECK GLUCOSE THREE  TIMES DAILY   acetaminophen (TYLENOL) 500 MG tablet Take 1,000 mg by mouth every 4 (four) hours as needed.   albuterol (VENTOLIN HFA) 108 (90 Base) MCG/ACT inhaler Inhale into the lungs.   Alirocumab (PRALUENT) 150 MG/ML SOAJ Inject 1 mL into the skin every 14 (fourteen) days.   aspirin EC 81 MG tablet Take 81 mg by mouth daily.   BD PEN NEEDLE MICRO U/F 32G X 6 MM MISC Inject into the skin.   benzonatate (TESSALON) 100 MG capsule Take 1 capsule (100 mg total) by mouth 2 (two) times daily as needed for cough.   clopidogrel (PLAVIX) 75 MG tablet Take 1 tablet (75 mg total) by mouth daily.   Continuous Blood Gluc Receiver (DEXCOM G6 RECEIVER) DEVI 1 PER 5 YEARS   Continuous Blood Gluc Sensor (DEXCOM G6 SENSOR) MISC See admin instructions.   ezetimibe (ZETIA) 10 MG tablet Take 1 tablet (10 mg total) by mouth daily.   famotidine (PEPCID) 40 MG tablet Take 1 tablet (40 mg total) by mouth daily.   fluticasone (FLONASE) 50 MCG/ACT nasal spray Place 2 sprays into both nostrils daily.   furosemide (LASIX) 20 MG tablet Takes 20 mg tablet qod and 40 mg on the other days.   insulin aspart (NOVOLOG) 100 UNIT/ML injection Inject 10 Units into the skin 3 (three) times daily with meals.   Insulin Glargine (BASAGLAR KWIKPEN) 100 UNIT/ML Inject 70 Units into the skin daily.   irbesartan (AVAPRO) 75 MG tablet Take 1 tablet (75 mg total) by mouth daily.   isosorbide mononitrate (IMDUR) 120 MG 24 hr tablet Take 1 tablet (120 mg total) by mouth daily.   LOKELMA 10 g  PACK packet Take 10 g by mouth daily.   loperamide (IMODIUM) 2 MG capsule Take 2 mg by mouth as needed for diarrhea or loose stools.   meclizine (ANTIVERT) 25 MG tablet Take 25 mg by mouth 3 (three) times daily as needed for dizziness.   nitroGLYCERIN (NITROSTAT) 0.4 MG SL tablet Take 1 tab under your tongue while sitting for chest pain, If no relief may repeat, one tab every 5 min up to 3 tabs total over 15 mins.   NOVOLOG FLEXPEN 100 UNIT/ML FlexPen  SMARTSIG:50 Unit(s) SUB-Q As Directed   pantoprazole (PROTONIX) 40 MG tablet Take 1 tablet by mouth twice daily   prednisoLONE acetate (PRED FORTE) 1 % ophthalmic suspension Place into the left eye.   promethazine (PHENERGAN) 12.5 MG tablet TAKE 1 TABLET BY MOUTH EVERY 6 HOURS AS NEEDED FOR NAUSEA AND VOMITING   carvedilol (COREG) 25 MG tablet Take 1 tablet (25 mg total) by mouth 2 (two) times daily.   etonogestrel (NEXPLANON) 68 MG IMPL implant 1 each (68 mg total) by Subdermal route once for 1 dose.   Facility-Administered Encounter Medications as of 09/24/2021  Medication   sodium chloride flush (NS) 0.9 % injection 3 mL    Allergies (verified) Lisinopril, Other, Rosemary oil, Shellfish allergy, Statins, and Metformin and related   History: Past Medical History:  Diagnosis Date   CAD (coronary artery disease)    a. 03/2015 NSTEMI/PCI: LCX 127m (DES), OM1 100p (DES - vessel labeled Ramus in subsequent cath report). Minor irregs to LAD/RCA; b. 04/2015 Cath: LM nl, LAD 40p, RI patent stent, LCX patent stent, RCA nl, EF 55-65%; c. 02/2016 MV: basal inferolateral and mid inferolateral defect/infarct w/o ischemia. EF 55-65%; d. 11/2018 PCI: LM 30ost, LAD 30p, LCX 95p/m (PTCA->20%), OM1 10ost, OM2 40ost, RCA 85p.   Chronic abdominal pain    Chronic diastolic (congestive) heart failure ([place])    a. 03/2015 Echo: EF 30-35%, mild concentric LVH, severe HK of inf, inflat, & lat walls, mod MR, mild TR;  b. 04/2015 LV Gram: EF 55-65%; c. 09/2017 Echo: EF 55-60%, no rwma. Mild MR; d. 11/2018 Echo: Ef 60-65%, no rwma.   CKD (chronic kidney disease), stage III ([place])    Diabetes type 2, controlled ([place])    a. since 2002    Diabetic gastroparesis ([facility])    a. chronic nausea/vomiting.   Diabetic retinopathy ([place])    Diverticulosis, sigmoid 07/2016   Fatty liver    GERD (gastroesophageal reflux disease)    Heavy menses    a. H/O IUD - expired in 2014 - remains in place.   Hx of migraines    Hyperlipidemia     Intolerant of atorvastatin, rosuvastatin   Hypertension    Iron deficiency anemia    Ischemic cardiomyopathy (resolved)    a. 03/2015 EF 30-35% post NSTEMI;  b. 04/2015 EF 55-65% on LV gram; c. 09/2017 Echo: EF 55-60%; d. 11/2018 Echo: Ef 60-65%.   Obesity    Tobacco abuse    a. quit 03/2015.   Uterine fibroid    Vertigo    Vitamin D deficiency    Past Surgical History:  Procedure Laterality Date   APPENDECTOMY     CARDIAC CATHETERIZATION  4/16   x2 stent [place] N/A 05/05/2015   Procedure: Left Heart Cath and Coronary Angiography;  Surgeon: Peter M Martinique, MD;  Location: [facility];  Service: Cardiovascular;  Laterality: N/A;   COLONOSCOPY WITH PROPOFOL N/A 07/31/2016   Procedure:  COLONOSCOPY WITH PROPOFOL;  Surgeon: Lollie Sails, MD;  Location[facility];  Service: Endoscopy;  Laterality: N/A;   CORONARY BALLOON ANGIOPLASTY N/A 12/04/2018   Procedure: CORONARY BALLOON ANGIOPLASTY;  Surgeon: Wellington Hampshire, MD;  Location: [place] [facility];  Service: Cardiovascular;  Laterality: N/A;   CORONARY STENT INTERVENTION N/A 01/12/2019   Procedure: CORONARY STENT INTERVENTION;  Surgeon: [name], MD;  Location: [place] [facility];  Service: Cardiovascular;  Laterality: N/A;   ESOPHAGOGASTRODUODENOSCOPY (EGD) WITH PROPOFOL N/A 07/31/2016   Procedure: ESOPHAGOGASTRODUODENOSCOPY (EGD) WITH PROPOFOL;  Surgeon: Lollie Sails, MD;  Location: [facility] ENDOSCOPY;  Service: Endoscopy;  Laterality: N/A;   INTRAVASCULAR PRESSURE WIRE/FFR STUDY N/A 12/04/2018   Procedure: INTRAVASCULAR PRESSURE WIRE/FFR STUDY;  Surgeon: Wellington Hampshire, MD;  Location: [place] [facility];  Service: Cardiovascular;  Laterality: N/A;   LAPAROSCOPIC APPENDECTOMY N/A 08/07/2016   Procedure: APPENDECTOMY LAPAROSCOPIC;  Surgeon: Hubbard Robinson, MD;  Location: [facility] ORS;  Service: General;  Laterality: N/A;   LEFT HEART CATH AND CORONARY ANGIOGRAPHY Left 12/04/2018    Procedure: LEFT HEART CATH AND CORONARY ANGIOGRAPHY;  Surgeon: Wellington Hampshire, MD;  Location: [place] [facility];  Service: Cardiovascular;  Laterality: Left;   LEFT HEART CATH AND CORONARY ANGIOGRAPHY N/A 01/12/2019   Procedure: LEFT HEART CATH AND CORONARY ANGIOGRAPHY;  Surgeon: [name], MD;  Location: [place] [facility];  Service: Cardiovascular;  Laterality: N/A;   MOUTH SURGERY     RIGHT/LEFT HEART CATH AND CORONARY ANGIOGRAPHY N/A 01/29/2021   Procedure: RIGHT/LEFT HEART CATH AND CORONARY ANGIOGRAPHY;  Surgeon: Wellington Hampshire, MD;  Location: [place] [facility];  Service: Cardiovascular;  Laterality: N/A;   Family History  Problem Relation Age of Onset   Diabetes Mother    Diabetes Father    Cancer - Cervical Maternal Aunt    Cancer Maternal Grandmother        lung   Cancer Maternal Grandfather        prostate   Diabetes Paternal Grandmother    Diabetes Paternal Grandfather    Social History   Socioeconomic History   Marital status: Married    Spouse name: Loss adjuster, chartered   Number of children: Not on file   Years of education: Not on file   Highest education level: Not on file  Occupational History   Not on file  Tobacco Use   Smoking status: Former    Years: 15.00    Types: Cigarettes    Quit date: 03/10/2015    Years since quitting: 6.5   Smokeless tobacco: Never  Vaping Use   Vaping Use: Never used  Substance and Sexual Activity   Alcohol use: No    Alcohol/week: 0.0 standard drinks   Drug use: Not Currently   Sexual activity: Yes    Birth control/protection: Implant  Other Topics Concern   Not on file  Social History Narrative   Lives locally with daughter and husband.   Social Determinants of Health   Financial Resource Strain: Low Risk    Difficulty of Paying Living Expenses: Not hard at all  Food Insecurity: No Food Insecurity   Worried About Charity fundraiser in the Last Year: Never true   [place] in the Last Year: Never true   Transportation Needs: No Transportation Needs   Lack of Transportation (Medical): No   Lack of Transportation (Non-Medical): No  Physical Activity: Inactive   Days of Exercise per Week: 0 days   Minutes of  Exercise per Session: 0 min  Stress: No Stress Concern Present   Feeling of Stress : Not at all  Social Connections: Not on file    Tobacco Counseling Counseling given: Not Answered   Clinical Intake:  Pre-visit preparation completed: Yes  Pain : 0-10 Pain Score: 1  Pain Type: Acute pain Pain Location: Abdomen Pain Descriptors / Indicators: Aching Pain Onset: Yesterday Pain Frequency: Intermittent     Nutritional Status: BMI > 30  Obese Nutritional Risks: Nausea/ vomitting/ diarrhea (some nausea) Diabetes: Yes  How often do you need to have someone help you when you read instructions, pamphlets, or other written materials from your doctor or pharmacy?: 1 - Never What is the last grade level you completed in school?: some college  Diabetic? Yes Nutrition Risk Assessment:  Has the patient had any N/V/D within the last 2 months?  Yes  Does the patient have any non-healing wounds?  No  Has the patient had any unintentional weight loss or weight gain?  Yes   Diabetes:  Is the patient diabetic?  Yes  If diabetic, was a CBG obtained today?  No  Did the patient bring in their glucometer from home?  No  How often do you monitor your CBG's? often.   Financial Strains and Diabetes Management:  Are you having any financial strains with the device, your supplies or your medication? No .  Does the patient want to be seen by Chronic Care Management for management of their diabetes?  No  Would the patient like to be referred to a Nutritionist or for Diabetic Management?  No   Diabetic Exams:  Diabetic Eye Exam: Completed 08/01/2021 Diabetic Foot Exam: Completed 07/19/2021   Interpreter Needed?: No  Information entered by :: NAllen LPN   Activities of Daily  Living In your present state of health, do you have any difficulty performing the following activities: 09/24/2021 01/29/2021  Hearing? N N  Vision? N N  Difficulty concentrating or making decisions? N N  Walking or climbing stairs? N N  Dressing or bathing? N N  Doing errands, shopping? N -  Preparing Food and eating ? N -  Using the Toilet? N -  In the past six months, have you accidently leaked urine? N -  Do you have problems with loss of bowel control? N -  Managing your Medications? N -  Managing your Finances? N -  Housekeeping or managing your Housekeeping? N -  Some recent data might be hidden    Patient Care Team: Venita Lick, NP as PCP - General (Nurse Practitioner) Wellington Hampshire, MD as PCP - Cardiology (Cardiology) Wellington Hampshire, MD as Consulting Physician (Cardiology) Alisa Graff, FNP as Nurse Practitioner (Family Medicine)  Indicate any recent Medical Services you may have received from other than [facility] providers in the past year (date may be approximate).     Assessment:   This is a routine wellness examination for [place].  Hearing/Vision screen Vision Screening - Comments:: Regular eye exams, [facility]  Dietary issues and exercise activities discussed: Current Exercise Habits: The patient does not participate in regular exercise at present   Goals Addressed             This Visit's Progress    Patient Stated       09/24/2021, wants to get a handle on general health       Depression Screen [facility] 2/9 Scores 09/24/2021 09/19/2021 09/18/2020 11/25/2019 01/21/2019 10/06/2018 01/09/2018  PHQ -  2 Score 0 0 0 2 5 1 3   PHQ- 9 Score - - - 13 20 6 18     Fall Risk Fall Risk  09/24/2021 09/19/2021 09/18/2020 11/25/2019 01/21/2019  Falls in the past year? 0 0 0 1 0  Number falls in past yr: - - - 0 -  Injury with Fall? - - - 0 -  Risk for fall due to : Medication side effect - Medication side effect - -  Follow up Education  provided;Falls evaluation completed;Falls prevention discussed - Falls evaluation completed;Education provided;Falls prevention discussed - -    FALL RISK PREVENTION PERTAINING TO THE HOME:  Any stairs in or around the home? Yes  If so, are there any without handrails? No  Home free of loose throw rugs in walkways, pet beds, electrical cords, etc? Yes  Adequate lighting in your home to reduce risk of falls? Yes   ASSISTIVE DEVICES UTILIZED TO PREVENT FALLS:  Life alert? No  Use of a cane, walker or w/c? No  Grab bars in the bathroom? Yes  Shower chair or bench in shower? No  Elevated toilet seat or a handicapped toilet? No   TIMED UP AND GO:  Was the test performed? No .       Cognitive Function:     6CIT Screen 09/24/2021 09/18/2020  What Year? 0 points 0 points  What month? 0 points 0 points  What time? 0 points 0 points  Count back from 20 0 points 0 points  Months in reverse 2 points 0 points  Repeat phrase 0 points 0 points  Total Score 2 0    Immunizations Immunization History  Administered Date(s) Administered   Influenza,inj,Quad PF,6+ Mos 08/30/2015, 09/02/2016, 08/12/2017, 08/12/2018, 08/18/2019   Pneumococcal Polysaccharide-23 12/15/2015   Tdap 07/20/2016    TDAP status: Up to date  Flu Vaccine status: Due, Education has been provided regarding the importance of this vaccine. Advised may receive this vaccine at local pharmacy or Health Dept. Aware to provide a copy of the vaccination record if obtained from local pharmacy or Health Dept. Verbalized acceptance and understanding.  Pneumococcal vaccine status: Up to date  Covid-19 vaccine status: Declined, Education has been provided regarding the importance of this vaccine but patient still declined. Advised may receive this vaccine at local pharmacy or Health Dept.or vaccine clinic. Aware to provide a copy of the vaccination record if obtained from local pharmacy or Health Dept. Verbalized acceptance and  understanding.  Qualifies for Shingles Vaccine? No   Zostavax completed No   Shingrix Completed?: n/a  Screening Tests Health Maintenance  Topic Date Due   COVID-19 Vaccine (1) Never done   INFLUENZA VACCINE  07/09/2021   HEMOGLOBIN A1C  01/04/2022   FOOT EXAM  07/19/2022   OPHTHALMOLOGY EXAM  08/01/2022   TETANUS/TDAP  07/20/2026   PAP SMEAR-Modifier  08/17/2026   Hepatitis C Screening  Completed   HIV Screening  Completed   HPV VACCINES  Aged Out    Health Maintenance  Health Maintenance Due  Topic Date Due   COVID-19 Vaccine (1) Never done   INFLUENZA VACCINE  07/09/2021    Colorectal cancer screening: Type of screening: Colonoscopy. Completed 07/31/2016. Repeat every 10 years  Mammogram status: Ordered today. Pt provided with contact info and advised to call to schedule appt.   Bone Density status: n/a  Lung Cancer Screening: (Low Dose CT Chest recommended if Age 49-80 years, 30 pack-year currently smoking OR have quit w/in 15years.) does not qualify.  Lung Cancer Screening Referral: no  Additional Screening:  Hepatitis C Screening: does qualify; Completed 06/17/2016  Vision Screening: Recommended annual ophthalmology exams for early detection of glaucoma and other disorders of the eye. Is the patient up to date with their annual eye exam?  Yes  Who is the provider or what is the name of the office in which the patient attends annual eye exams? [facility] If pt is not established with a provider, would they like to be referred to a provider to establish care? No .   Dental Screening: Recommended annual dental exams for proper oral hygiene  Community Resource Referral / Chronic Care Management: CRR required this visit?  No   CCM required this visit?  No      Plan:     I have personally reviewed and noted the following in the patient's chart:   Medical and social history Use of alcohol, tobacco or illicit drugs  Current medications and  supplements including opioid prescriptions.  Functional ability and status Nutritional status Physical activity Advanced directives List of other physicians Hospitalizations, surgeries, and ER visits in previous 12 months Vitals Screenings to include cognitive, depression, and falls Referrals and appointments  In addition, I have reviewed and discussed with patient certain preventive protocols, quality metrics, and best practice recommendations. A written personalized care plan for preventive services as well as general preventive health recommendations were provided to patient.     Kellie Simmering, LPN   [id number]   Nurse Notes:

## 2021-09-24 NOTE — Patient Instructions (Signed)
[name] [name] , Thank you for taking time to come for your Medicare Wellness Visit. I appreciate your ongoing commitment to your health goals. Please review the following plan we discussed and let me know if I can assist you in the future.   Screening recommendations/referrals: Colonoscopy: completed 07/31/2016 Mammogram: ordered today Bone Density: n/a Recommended yearly ophthalmology/optometry visit for glaucoma screening and checkup Recommended yearly dental visit for hygiene and checkup  Vaccinations: Influenza vaccine: due Pneumococcal vaccine: completed 12/15/2015 Tdap vaccine: completed 07/20/2016, due 07/20/2026 Shingles vaccine: n/a  Covid-19: decline  Advanced directives: Advance directive discussed with you today.   Conditions/risks identified: none  Next appointment: Follow up in one year for your annual wellness visit.   Preventive Care 40-64 Years, Female Preventive care refers to lifestyle choices and visits with your health care provider that can promote health and wellness. What does preventive care include? A yearly physical exam. This is also called an annual well check. Dental exams once or twice a year. Routine eye exams. Ask your health care provider how often you should have your eyes checked. Personal lifestyle choices, including: Daily care of your teeth and gums. Regular physical activity. Eating a healthy diet. Avoiding tobacco and drug use. Limiting alcohol use. Practicing safe sex. Taking low-dose aspirin daily starting at age 68. Taking vitamin and mineral supplements as recommended by your health care provider. What happens during an annual well check? The services and screenings done by your health care provider during your annual well check will depend on your age, overall health, lifestyle risk factors, and family history of disease. Counseling  Your health care provider may ask you questions about your: Alcohol use. Tobacco use. Drug  use. Emotional well-being. Home and relationship well-being. Sexual activity. Eating habits. Work and work Statistician. Method of birth control. Menstrual cycle. Pregnancy history. Screening  You may have the following tests or measurements: Height, weight, and BMI. Blood pressure. Lipid and cholesterol levels. These may be checked every 5 years, or more frequently if you are over 74 years old. Skin check. Lung cancer screening. You may have this screening every year starting at age 54 if you have a 30-pack-year history of smoking and currently smoke or have quit within the past 15 years. Fecal occult blood test (FOBT) of the stool. You may have this test every year starting at age 36. Flexible sigmoidoscopy or colonoscopy. You may have a sigmoidoscopy every 5 years or a colonoscopy every 10 years starting at age 9. Hepatitis C blood test. Hepatitis B blood test. Sexually transmitted disease (STD) testing. Diabetes screening. This is done by checking your blood sugar (glucose) after you have not eaten for a while (fasting). You may have this done every 1-3 years. Mammogram. This may be done every 1-2 years. Talk to your health care provider about when you should start having regular mammograms. This may depend on whether you have a family history of breast cancer. BRCA-related cancer screening. This may be done if you have a family history of breast, ovarian, tubal, or peritoneal cancers. Pelvic exam and Pap test. This may be done every 3 years starting at age 56. Starting at age 31, this may be done every 5 years if you have a Pap test in combination with an HPV test. Bone density scan. This is done to screen for osteoporosis. You may have this scan if you are at high risk for osteoporosis. Discuss your test results, treatment options, and if necessary, the need for more tests with  your health care provider. Vaccines  Your health care provider may recommend certain vaccines, such  as: Influenza vaccine. This is recommended every year. Tetanus, diphtheria, and acellular pertussis (Tdap, Td) vaccine. You may need a Td booster every 10 years. Zoster vaccine. You may need this after age 23. Pneumococcal 13-valent conjugate (PCV13) vaccine. You may need this if you have certain conditions and were not previously vaccinated. Pneumococcal polysaccharide (PPSV23) vaccine. You may need one or two doses if you smoke cigarettes or if you have certain conditions. Talk to your health care provider about which screenings and vaccines you need and how often you need them. This information is not intended to replace advice given to you by your health care provider. Make sure you discuss any questions you have with your health care provider. Document Released: 12/22/2015 Document Revised: 08/14/2016 Document Reviewed: 09/26/2015 Elsevier Interactive Patient Education  2017 [place] Prevention in the Home Falls can cause injuries. They can happen to people of all ages. There are many things you can do to make your home safe and to help prevent falls. What can I do on the outside of my home? Regularly fix the edges of walkways and driveways and fix any cracks. Remove anything that might make you trip as you walk through a door, such as a raised step or threshold. Trim any bushes or trees on the path to your home. Use bright outdoor lighting. Clear any walking paths of anything that might make someone trip, such as rocks or tools. Regularly check to see if handrails are loose or broken. Make sure that both sides of any steps have handrails. Any raised decks and porches should have guardrails on the edges. Have any leaves, snow, or ice cleared regularly. Use sand or salt on walking paths during winter. Clean up any spills in your garage right away. This includes oil or grease spills. What can I do in the bathroom? Use night lights. Install grab bars by the toilet and in  the tub and shower. Do not use towel bars as grab bars. Use non-skid mats or decals in the tub or shower. If you need to sit down in the shower, use a plastic, non-slip stool. Keep the floor dry. Clean up any water that spills on the floor as soon as it happens. Remove soap buildup in the tub or shower regularly. Attach bath mats securely with double-sided non-slip rug tape. Do not have throw rugs and other things on the floor that can make you trip. What can I do in the bedroom? Use night lights. Make sure that you have a light by your bed that is easy to reach. Do not use any sheets or blankets that are too big for your bed. They should not hang down onto the floor. Have a firm chair that has side arms. You can use this for support while you get dressed. Do not have throw rugs and other things on the floor that can make you trip. What can I do in the kitchen? Clean up any spills right away. Avoid walking on wet floors. Keep items that you use a lot in easy-to-reach places. If you need to reach something above you, use a strong step stool that has a grab bar. Keep electrical cords out of the way. Do not use floor polish or wax that makes floors slippery. If you must use wax, use non-skid floor wax. Do not have throw rugs and other things on the floor  that can make you trip. What can I do with my stairs? Do not leave any items on the stairs. Make sure that there are handrails on both sides of the stairs and use them. Fix handrails that are broken or loose. Make sure that handrails are as long as the stairways. Check any carpeting to make sure that it is firmly attached to the stairs. Fix any carpet that is loose or worn. Avoid having throw rugs at the top or bottom of the stairs. If you do have throw rugs, attach them to the floor with carpet tape. Make sure that you have a light switch at the top of the stairs and the bottom of the stairs. If you do not have them, ask someone to add them  for you. What else can I do to help prevent falls? Wear shoes that: Do not have high heels. Have rubber bottoms. Are comfortable and fit you well. Are closed at the toe. Do not wear sandals. If you use a stepladder: Make sure that it is fully opened. Do not climb a closed stepladder. Make sure that both sides of the stepladder are locked into place. Ask someone to hold it for you, if possible. Clearly mark and make sure that you can see: Any grab bars or handrails. First and last steps. Where the edge of each step is. Use tools that help you move around (mobility aids) if they are needed. These include: Canes. Walkers. Scooters. Crutches. Turn on the lights when you go into a dark area. Replace any light bulbs as soon as they burn out. Set up your furniture so you have a clear path. Avoid moving your furniture around. If any of your floors are uneven, fix them. If there are any pets around you, be aware of where they are. Review your medicines with your doctor. Some medicines can make you feel dizzy. This can increase your chance of falling. Ask your doctor what other things that you can do to help prevent falls. This information is not intended to replace advice given to you by your health care provider. Make sure you discuss any questions you have with your health care provider. Document Released: 09/21/2009 Document Revised: 05/02/2016 Document Reviewed: 12/30/2014 Elsevier Interactive Patient Education  2017 [employer].

## 2021-09-27 NOTE — Telephone Encounter (Signed)
Nexplanon rcvd/charged 08/17/21

## 2021-09-28 DIAGNOSIS — E113551 Type 2 diabetes mellitus with stable proliferative diabetic retinopathy, right eye: Secondary | ICD-10-CM | POA: Diagnosis not present

## 2021-09-28 DIAGNOSIS — E113512 Type 2 diabetes mellitus with proliferative diabetic retinopathy with macular edema, left eye: Secondary | ICD-10-CM | POA: Diagnosis not present

## 2021-10-03 ENCOUNTER — Other Ambulatory Visit: Payer: Self-pay | Admitting: Nurse Practitioner

## 2021-10-03 NOTE — Telephone Encounter (Signed)
Requested Prescriptions  Pending Prescriptions Disp Refills  . pantoprazole (PROTONIX) 40 MG tablet [Pharmacy Med Name: Pantoprazole Sodium 40 MG Oral Tablet Delayed Release] 30 tablet 0    Sig: Take 1 tablet by mouth twice daily     Gastroenterology: Proton Pump Inhibitors Passed - 10/03/2021  6:44 AM      Passed - Valid encounter within last 12 months    Recent Outpatient Visits          1 month ago Poorly controlled type 2 diabetes mellitus ([place])   [place], Henrine Screws T, NP   1 year ago Dizziness   [place], [place], [place]   2 years ago Diabetic gastroparesis associated with type 2 diabetes mellitus ([place])   [place], [place] T, NP   2 years ago Cannabis hyperemesis syndrome concurrent with and due to cannabis abuse ([place])   [place], [place], DO   2 years ago Diabetic gastroparesis associated with type 2 diabetes mellitus ([place])   [place], Barbaraann Faster, NP      Future Appointments            In 1 month Cannady, Barbaraann Faster, NP [facility], [place]   In 4 months Ar[place], Mertie Clause, MD [facility], [facility]   In 11 months  [facility], [place]

## 2021-10-10 DIAGNOSIS — H25811 Combined forms of age-related cataract, right eye: Secondary | ICD-10-CM | POA: Diagnosis not present

## 2021-10-10 HISTORY — PX: CATARACT EXTRACTION: SUR2

## 2021-10-16 DIAGNOSIS — E669 Obesity, unspecified: Secondary | ICD-10-CM | POA: Diagnosis not present

## 2021-10-16 DIAGNOSIS — E1169 Type 2 diabetes mellitus with other specified complication: Secondary | ICD-10-CM | POA: Diagnosis not present

## 2021-10-19 ENCOUNTER — Encounter: Payer: Self-pay | Admitting: Nurse Practitioner

## 2021-10-19 ENCOUNTER — Other Ambulatory Visit: Payer: Self-pay | Admitting: Nurse Practitioner

## 2021-10-19 MED ORDER — PANTOPRAZOLE SODIUM 40 MG PO TBEC: 40.0000 mg | DELAYED_RELEASE_TABLET | Freq: Two times a day (BID) | ORAL | 4 refills | Status: DC

## 2021-10-19 NOTE — Telephone Encounter (Signed)
Requested by interface surescripts. Signed today 10/19/21 at 34:03 am. Duplicate request.  Requested Prescriptions  Refused Prescriptions Disp Refills  . pantoprazole (PROTONIX) 40 MG tablet [Pharmacy Med Name: Pantoprazole Sodium 40 MG Oral Tablet Delayed Release] 30 tablet 0    Sig: Take 1 tablet by mouth twice daily     Gastroenterology: Proton Pump Inhibitors Passed - 10/19/2021  7:08 AM      Passed - Valid encounter within last 12 months    Recent Outpatient Visits          2 months ago Poorly controlled type 2 diabetes mellitus ([place])   [place], Henrine Screws T, NP   1 year ago Dizziness   [place], [place], [place]   2 years ago Diabetic gastroparesis associated with type 2 diabetes mellitus ([place])   [place], [place] T, NP   2 years ago Cannabis hyperemesis syndrome concurrent with and due to cannabis abuse ([place])   [place], [place], DO   2 years ago Diabetic gastroparesis associated with type 2 diabetes mellitus ([place])   [place], Barbaraann Faster, NP      Future Appointments            In 2 weeks Cannady, Barbaraann Faster, NP [facility], [place]   In 4 months Arida, Mertie Clause, MD [facility], [facility]   In 11 months  [facility], [place]

## 2021-10-22 ENCOUNTER — Other Ambulatory Visit: Payer: Self-pay

## 2021-10-22 ENCOUNTER — Other Ambulatory Visit (INDEPENDENT_AMBULATORY_CARE_PROVIDER_SITE_OTHER): Payer: Medicare Other

## 2021-10-22 DIAGNOSIS — E785 Hyperlipidemia, unspecified: Secondary | ICD-10-CM | POA: Diagnosis not present

## 2021-10-23 DIAGNOSIS — E669 Obesity, unspecified: Secondary | ICD-10-CM | POA: Diagnosis not present

## 2021-10-23 DIAGNOSIS — Z794 Long term (current) use of insulin: Secondary | ICD-10-CM | POA: Diagnosis not present

## 2021-10-23 DIAGNOSIS — E1122 Type 2 diabetes mellitus with diabetic chronic kidney disease: Secondary | ICD-10-CM | POA: Diagnosis not present

## 2021-10-23 DIAGNOSIS — E11311 Type 2 diabetes mellitus with unspecified diabetic retinopathy with macular edema: Secondary | ICD-10-CM | POA: Diagnosis not present

## 2021-10-23 DIAGNOSIS — E1169 Type 2 diabetes mellitus with other specified complication: Secondary | ICD-10-CM | POA: Diagnosis not present

## 2021-10-23 DIAGNOSIS — E1159 Type 2 diabetes mellitus with other circulatory complications: Secondary | ICD-10-CM | POA: Diagnosis not present

## 2021-10-23 DIAGNOSIS — E875 Hyperkalemia: Secondary | ICD-10-CM | POA: Diagnosis not present

## 2021-10-23 DIAGNOSIS — I10 Essential (primary) hypertension: Secondary | ICD-10-CM | POA: Diagnosis not present

## 2021-10-23 DIAGNOSIS — E1143 Type 2 diabetes mellitus with diabetic autonomic (poly)neuropathy: Secondary | ICD-10-CM | POA: Diagnosis not present

## 2021-10-23 DIAGNOSIS — N184 Chronic kidney disease, stage 4 (severe): Secondary | ICD-10-CM | POA: Diagnosis not present

## 2021-10-23 LAB — LIPID PANEL
Chol/HDL Ratio: 4.2 ratio (ref 0.0–4.4)
Cholesterol, Total: 140 mg/dL (ref 100–199)
HDL: 33 mg/dL — ABNORMAL LOW (ref >39)
LDL Chol Calc (NIH): 85 mg/dL (ref 0–99)
Triglycerides: 124 mg/dL (ref 0–149)
VLDL Cholesterol Cal: 22 mg/dL (ref 5–40)

## 2021-10-23 LAB — HEPATIC FUNCTION PANEL
ALT: 18 IU/L (ref 0–32)
AST: 13 IU/L (ref 0–40)
Albumin: 3.8 g/dL (ref 3.8–4.8)
Alkaline Phosphatase: 73 IU/L (ref 44–121)
Bilirubin Total: <0.2 mg/dL (ref 0.0–1.2)
Bilirubin, Direct: <0.1 mg/dL (ref 0.00–0.40)
Total Protein: 6.6 g/dL (ref 6.0–8.5)

## 2021-10-26 ENCOUNTER — Telehealth: Payer: Self-pay | Admitting: Cardiovascular Disease

## 2021-10-26 DIAGNOSIS — E113551 Type 2 diabetes mellitus with stable proliferative diabetic retinopathy, right eye: Secondary | ICD-10-CM | POA: Diagnosis not present

## 2021-10-26 DIAGNOSIS — H4312 Vitreous hemorrhage, left eye: Secondary | ICD-10-CM | POA: Diagnosis not present

## 2021-10-26 DIAGNOSIS — Z01818 Encounter for other preprocedural examination: Secondary | ICD-10-CM | POA: Diagnosis not present

## 2021-10-26 DIAGNOSIS — E113512 Type 2 diabetes mellitus with proliferative diabetic retinopathy with macular edema, left eye: Secondary | ICD-10-CM | POA: Diagnosis not present

## 2021-10-26 NOTE — Telephone Encounter (Signed)
[name] [name] 40 year old female is requesting preoperative cardiac evaluation for a vitrectomy.  She was last seen in the clinic on 08/16/2021.  During that time she continued to do well.  Follow-up was planned for 6 months.  Her PMH includes coronary artery disease, type 2 diabetes, diabetic gastroparesis, chronic nausea and vomiting, ischemic cardiomyopathy with subsequent normalization of LV function, stage II CKD, HTN, HLD, and obesity.  She has had multiple cardiac catheterizations.  Her most recent cardiac catheterization was a left and right heart cath 2/20 which showed patent stents in her RCA, left circumflex and OM1.  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  Her right heart cath showed normal filling pressures, normal pulmonary pressure, and normal cardiac output.   May her Plavix be held prior to her procedure?  Thank you for your help.  Please direct your response to CV DIV pool.  Jossie Ng. Shonika Kolasinski NP-C    10/26/2021, 3:02 PM [place] [address] Office [phone] Fax [phone]

## 2021-10-26 NOTE — Telephone Encounter (Signed)
   Primary Cardiologist: Kathlyn [place], MD  Chart reviewed as part of pre-operative protocol coverage. Given past medical history and time since last visit, based on ACC/AHA guidelines, [name] [name] would be at acceptable risk for the planned procedure without further cardiovascular testing.   Her Plavix may be held for 5 days prior to her surgery.  Please resume as soon as hemostasis is achieved at the discretion of the surgeon.  I will route this recommendation to the requesting party via Epic fax function and remove from pre-op pool.  Please call with questions.  Jossie Ng. Slevin Gunby NP-C    10/26/2021, 3:34 PM [place] [address] Office [phone] Fax [phone]

## 2021-10-26 NOTE — Telephone Encounter (Signed)
   [facility] Pre-operative Risk Assessment    Patient Name: [name] [name]  DOB: January 22, 1981 MRN: [id number]  HEARTCARE STAFF:  - IMPORTANT!!!!!! Under Visit Info/Reason for Call, type in Other and utilize the format Clearance MM/DD/YY or Clearance TBD. Do not use dashes or single digits. - Please review there is not already an duplicate clearance open for this procedure. - If request is for dental extraction, please clarify the # of teeth to be extracted. - If the patient is currently at the dentist's office, call Pre-Op Callback Staff (MA/nurse) to input urgent request.  - If the patient is not currently in the dentist office, please route to the Pre-Op pool.  Request for surgical clearance:  What type of surgery is being performed? Vitrectomy  When is this surgery scheduled? 10/30/21  What type of clearance is required (medical clearance vs. Pharmacy clearance to hold med vs. Both)? both  Are there any medications that need to be held prior to surgery and how long? Plavix starting today  Practice name and name of physician performing surgery? [facility], Dr. Dennie Fetters  What is the office phone number? [phone]   7.   What is the office fax number? [phone]  8.   Anesthesia type (None, local, MAC, general) ? IV Sedation   [name] [name] 10/26/2021, 2:33 PM  _________________________________________________________________   (provider comments below)

## 2021-10-26 NOTE — Telephone Encounter (Signed)
Plavix can be held 5 days before it should be resumed after the surgery.

## 2021-10-30 DIAGNOSIS — H4312 Vitreous hemorrhage, left eye: Secondary | ICD-10-CM | POA: Diagnosis not present

## 2021-10-30 HISTORY — PX: VITRECTOMY: SHX106

## 2021-11-06 ENCOUNTER — Ambulatory Visit: Payer: Medicare Other | Admitting: Nurse Practitioner

## 2021-11-06 DIAGNOSIS — E1165 Type 2 diabetes mellitus with hyperglycemia: Secondary | ICD-10-CM

## 2021-11-06 DIAGNOSIS — N183 Chronic kidney disease, stage 3 unspecified: Secondary | ICD-10-CM

## 2021-11-06 DIAGNOSIS — I5032 Chronic diastolic (congestive) heart failure: Secondary | ICD-10-CM

## 2021-11-06 DIAGNOSIS — E1169 Type 2 diabetes mellitus with other specified complication: Secondary | ICD-10-CM

## 2021-11-06 DIAGNOSIS — E875 Hyperkalemia: Secondary | ICD-10-CM

## 2021-11-07 DIAGNOSIS — H4313 Vitreous hemorrhage, bilateral: Secondary | ICD-10-CM | POA: Diagnosis not present

## 2021-11-07 DIAGNOSIS — E113551 Type 2 diabetes mellitus with stable proliferative diabetic retinopathy, right eye: Secondary | ICD-10-CM | POA: Diagnosis not present

## 2021-11-07 DIAGNOSIS — E113512 Type 2 diabetes mellitus with proliferative diabetic retinopathy with macular edema, left eye: Secondary | ICD-10-CM | POA: Diagnosis not present

## 2021-11-14 ENCOUNTER — Encounter: Payer: Self-pay | Admitting: Nurse Practitioner

## 2021-11-14 ENCOUNTER — Other Ambulatory Visit: Payer: Self-pay

## 2021-11-14 ENCOUNTER — Ambulatory Visit (INDEPENDENT_AMBULATORY_CARE_PROVIDER_SITE_OTHER): Payer: Medicare Other | Admitting: Nurse Practitioner

## 2021-11-14 VITALS — BP 122/76 | HR 69 | Temp 98.5°F | Wt 262.8 lb

## 2021-11-14 DIAGNOSIS — N2581 Secondary hyperparathyroidism of renal origin: Secondary | ICD-10-CM | POA: Diagnosis not present

## 2021-11-14 DIAGNOSIS — K3184 Gastroparesis: Secondary | ICD-10-CM

## 2021-11-14 DIAGNOSIS — I25118 Atherosclerotic heart disease of native coronary artery with other forms of angina pectoris: Secondary | ICD-10-CM | POA: Diagnosis not present

## 2021-11-14 DIAGNOSIS — Z23 Encounter for immunization: Secondary | ICD-10-CM | POA: Diagnosis not present

## 2021-11-14 DIAGNOSIS — N183 Chronic kidney disease, stage 3 unspecified: Secondary | ICD-10-CM | POA: Diagnosis not present

## 2021-11-14 DIAGNOSIS — Z6841 Body Mass Index (BMI) 40.0 and over, adult: Secondary | ICD-10-CM

## 2021-11-14 DIAGNOSIS — E1129 Type 2 diabetes mellitus with other diabetic kidney complication: Secondary | ICD-10-CM

## 2021-11-14 DIAGNOSIS — E1169 Type 2 diabetes mellitus with other specified complication: Secondary | ICD-10-CM

## 2021-11-14 DIAGNOSIS — E1122 Type 2 diabetes mellitus with diabetic chronic kidney disease: Secondary | ICD-10-CM | POA: Diagnosis not present

## 2021-11-14 DIAGNOSIS — I130 Hypertensive heart and chronic kidney disease with heart failure and stage 1 through stage 4 chronic kidney disease, or unspecified chronic kidney disease: Secondary | ICD-10-CM | POA: Diagnosis not present

## 2021-11-14 DIAGNOSIS — E785 Hyperlipidemia, unspecified: Secondary | ICD-10-CM

## 2021-11-14 DIAGNOSIS — R809 Proteinuria, unspecified: Secondary | ICD-10-CM | POA: Diagnosis not present

## 2021-11-14 DIAGNOSIS — I5032 Chronic diastolic (congestive) heart failure: Secondary | ICD-10-CM | POA: Diagnosis not present

## 2021-11-14 DIAGNOSIS — E1143 Type 2 diabetes mellitus with diabetic autonomic (poly)neuropathy: Secondary | ICD-10-CM

## 2021-11-14 DIAGNOSIS — E1165 Type 2 diabetes mellitus with hyperglycemia: Secondary | ICD-10-CM | POA: Diagnosis not present

## 2021-11-14 DIAGNOSIS — Z794 Long term (current) use of insulin: Secondary | ICD-10-CM

## 2021-11-14 LAB — MICROALBUMIN, URINE WAIVED
Creatinine, Urine Waived: 50 mg/dL (ref 10–300)
Microalb, Ur Waived: 150 mg/L — ABNORMAL HIGH (ref 0–19)
Microalb/Creat Ratio: >300 mg/g — ABNORMAL HIGH (ref <30)

## 2021-11-14 NOTE — Assessment & Plan Note (Signed)
Chronic, stable on recent A1c with endo = 7.2%.  Continue current medication regimen as prescribed by endo and collaboration with them.  BMP today, obtain urine ALB today.  Continue focus on vegan style diet at home.  Return in 3 months.

## 2021-11-14 NOTE — Assessment & Plan Note (Signed)
Chronic, ongoing.  BP at goal today.  Recommend she monitor BP at least a few mornings a week at home and document.  DASH diet at home.  Continue current medication regimen and adjust as needed -- continue collaboration with cardiology.  Labs today: BMP.  Return in 3 months.

## 2021-11-14 NOTE — Assessment & Plan Note (Signed)
Chronic, ongoing with poor tolerance to statins.  Followed by cardiology.  Continue Praluent, as is intolerant to statins.  Lipid panel up to date.

## 2021-11-14 NOTE — Assessment & Plan Note (Signed)
Chronic, ongoing, followed by nephrology.  Continue this collaboration - recent note and labs reviewed.  Urine ALB 150 today.

## 2021-11-14 NOTE — Assessment & Plan Note (Signed)
Chronic, stable, followed by cardiology.  Continue this collaboration and medication regimen as prescribed by them. 

## 2021-11-14 NOTE — Assessment & Plan Note (Signed)
Chronic, ongoing, followed by nephrology.  Recent notes and labs reviewed.  Continue current medications as prescribed by them.  BMP today, urine ALB 150 on check.  Continue focus on vegan style diet.

## 2021-11-14 NOTE — Assessment & Plan Note (Signed)
Chronic, ongoing, followed by cardiology.  Euvolemic today.  Continue current medication regimen as prescribed by them.  Recommend: - Reminded to call for an overnight weight gain of >2 pounds or a weekly weight gain of >5 pounds - not adding salt to food and read food labels. Reviewed the importance of keeping daily sodium intake to 2000mg  daily - Avoid all NSAID products, referral to dietician placed to help with diet.

## 2021-11-14 NOTE — Assessment & Plan Note (Signed)
BMI 43.73 with T2DM, HF, CKD.  Recommended eating smaller high protein, low fat meals more frequently and exercising 30 mins a day 5 times a week with a goal of 10-15lb weight loss in the next 3 months. Patient voiced their understanding and motivation to adhere to these recommendations.

## 2021-11-14 NOTE — Assessment & Plan Note (Signed)
Chronic, ongoing, followed by nephrology.  Continue this collaboration - recent note and labs reviewed. 

## 2021-11-14 NOTE — Assessment & Plan Note (Signed)
Ongoing, continue collaboration with endocrinology. 

## 2021-11-14 NOTE — Patient Instructions (Signed)

## 2021-11-14 NOTE — Progress Notes (Signed)
BP 122/76   Pulse 69   Temp 98.5 F (36.9 C)   Wt 262 lb 12.8 oz (119.2 kg)   SpO2 99%   BMI 43.73 kg/m    Subjective:    Patient ID: [name] [name], female    DOB: May 26, 1981, 40 y.o.   MRN: [id number]  HPI: [name] [name] is a 40 y.o. female  Chief Complaint  Patient presents with   Diabetes   Hyperlipidemia   Hypertension   Chronic Kidney Disease    Patient states she would like to follow up on and have her kidney function checked at today's visit.    Congestive Heart Failure   kidney   DIABETES WITH GASTROPARESIS Last seen by endocrinology 10/23/21 with A1c 7.2%.  Her Basaglar was changed to 80 units and Novolog is on sliding scale -- she has not had to sue much.  Previously was being followed by GI, reports less issues with stomach now -- she is no longer eating meat, with exception of fish, and had gone to more vegan diet.  She is using Dexcom. Hypoglycemic episodes:no Polydipsia/polyuria: no Visual disturbance: no Chest pain: no Paresthesias: no Glucose Monitoring: yes  Accucheck frequency: BID  Fasting glucose: 131 at present  Post prandial:  Evening: varies  Before meals: Taking Insulin?: yes  Long acting insulin: 80 Basaglar  Short acting insulin: sliding scale Blood Pressure Monitoring: daily Retinal Examination: Up to Date Foot Exam: Up to Date Pneumovax: Up to Date Influenza: Up to Date Aspirin: yes   HYPERTENSION / HYPERLIPIDEMIA Followed by cardiology and last seen 02/09/21. Is statin intolerant and takes Praluent.   Satisfied with current treatment? yes Duration of hypertension: chronic BP monitoring frequency: daily BP range: 120-140/80's BP medication side effects: no Duration of hyperlipidemia: chronic Cholesterol supplements: none Medication compliance: good compliance Aspirin: yes Recent stressors: no Recurrent headaches: no Visual changes: no Palpitations: no Dyspnea: no Chest pain: no Lower extremity edema:  none Dizzy/lightheaded: occasional with low BP levels at times  CHRONIC KIDNEY DISEASE Is followed by nephrology for CKD and last saw 07/24/21 with labs CRT 2.7, eGFR 24, PTH 122.  Dose of Lokelma adjusted -- she is to take this every day, but does endorse forgetting sometimes.  Recent K+ 5.8 on 10/23/21 CKD status: stable Medications renally dose: yes Previous renal evaluation: yes Pneumovax:  Up to Date Influenza Vaccine:  Up to Date   Relevant past medical, surgical, family and social history reviewed and updated as indicated. Interim medical history since our last visit reviewed. Allergies and medications reviewed and updated.  Review of Systems  Constitutional:  Negative for activity change, appetite change, diaphoresis, fatigue and fever.  Respiratory:  Negative for cough, chest tightness and shortness of breath.   Cardiovascular:  Negative for chest pain, palpitations and leg swelling.  Gastrointestinal:  Negative for abdominal distention, abdominal pain, constipation, diarrhea, nausea and vomiting.  Endocrine: Negative for cold intolerance, heat intolerance, polydipsia, polyphagia and polyuria.  Neurological:  Negative for dizziness, syncope, weakness, light-headedness, numbness and headaches.  Psychiatric/Behavioral: Negative.     Per HPI unless specifically indicated above     Objective:    BP 122/76   Pulse 69   Temp 98.5 F (36.9 C)   Wt 262 lb 12.8 oz (119.2 kg)   SpO2 99%   BMI 43.73 kg/m   Wt Readings from Last 3 Encounters:  11/14/21 262 lb 12.8 oz (119.2 kg)  09/24/21 263 lb (119.3 kg)  09/19/21 267 lb 8 oz (121.3 kg)  Physical Exam Vitals and nursing note reviewed.  Constitutional:      General: She is awake. She is not in acute distress.    Appearance: She is well-developed. She is obese. She is not ill-appearing.  HENT:     Head: Normocephalic.     Right Ear: Hearing normal.     Left Ear: Hearing normal.  Eyes:     General: Lids are normal.         Right eye: No discharge.        Left eye: No discharge.     Conjunctiva/sclera: Conjunctivae normal.     Pupils: Pupils are equal, round, and reactive to light.  Neck:     Thyroid: No thyromegaly.     Vascular: No carotid bruit.  Cardiovascular:     Rate and Rhythm: Normal rate and regular rhythm.     Heart sounds: Normal heart sounds. No murmur heard.   No gallop.  Pulmonary:     Effort: Pulmonary effort is normal. No accessory muscle usage or respiratory distress.     Breath sounds: Normal breath sounds.  Abdominal:     General: Bowel sounds are normal.     Palpations: Abdomen is soft. There is no hepatomegaly or splenomegaly.     Tenderness: There is no abdominal tenderness.  Musculoskeletal:     Cervical back: Normal range of motion and neck supple.     Right lower leg: No edema.     Left lower leg: No edema.  Skin:    General: Skin is warm and dry.  Neurological:     Mental Status: She is alert and oriented to person, place, and time.  Psychiatric:        Attention and Perception: Attention normal.        Mood and Affect: Mood normal.        Behavior: Behavior normal. Behavior is cooperative.        Thought Content: Thought content normal.        Judgment: Judgment normal.    Results for orders placed or performed in visit on 10/22/21  Lipid panel  Result Value Ref Range   Cholesterol, Total 140 100 - 199 mg/dL   Triglycerides 124 0 - 149 mg/dL   HDL 33 (L) >39 mg/dL   VLDL Cholesterol Cal 22 5 - 40 mg/dL   LDL Chol Calc (NIH) 85 0 - 99 mg/dL   Chol/HDL Ratio 4.2 0.0 - 4.4 ratio  Hepatic function panel  Result Value Ref Range   Total Protein 6.6 6.0 - 8.5 g/dL   Albumin 3.8 3.8 - 4.8 g/dL   Bilirubin Total <0.2 0.0 - 1.2 mg/dL   Bilirubin, Direct <0.10 0.00 - 0.40 mg/dL   Alkaline Phosphatase 73 44 - 121 IU/L   AST 13 0 - 40 IU/L   ALT 18 0 - 32 IU/L      Assessment & Plan:   Problem List Items Addressed This Visit       Cardiovascular and  Mediastinum   Chronic diastolic heart failure (HCC) (Chronic)    Chronic, ongoing, followed by cardiology.  Euvolemic today.  Continue current medication regimen as prescribed by them.  Recommend: - Reminded to call for an overnight weight gain of >2 pounds or a weekly weight gain of >5 pounds - not adding salt to food and read food labels. Reviewed the importance of keeping daily sodium intake to '2000mg'$  daily - Avoid all NSAID products, referral to dietician placed to help with  diet.      Coronary artery disease of native artery of native heart with stable angina pectoris (HCC)    Chronic, stable, followed by cardiology.  Continue this collaboration and medication regimen as prescribed by them.      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  BP at goal today.  Recommend she monitor BP at least a few mornings a week at home and document.  DASH diet at home.  Continue current medication regimen and adjust as needed -- continue collaboration with cardiology.  Labs today: BMP.  Return in 3 months.       Relevant Orders   Basic metabolic panel   TSH     Digestive   Diabetic gastroparesis associated with type 2 diabetes mellitus (HCC)    Stable, no further discomfort.  Continue current diabetic regimen as ordered by endo and collaboration with both GI and endocrinology.  Continue to collaborate with CCM team as needed.  Check Mag, Phosphorous, and TSH today. Return in 3 months.      Relevant Orders   Magnesium   Phosphorus     Endocrine   CKD stage 3 due to type 2 diabetes mellitus (HCC)    Chronic, ongoing, followed by nephrology.  Recent notes and labs reviewed.  Continue current medications as prescribed by them.  BMP today, urine ALB 150 on check.  Continue focus on vegan style diet.      Hyperlipidemia associated with type 2 diabetes mellitus  (HCC)    Chronic, ongoing with poor tolerance to statins.  Followed by cardiology.  Continue Praluent, as is intolerant to statins.  Lipid panel up to date.      Hyperparathyroidism due to renal insufficiency (HCC)    Chronic, ongoing, followed by nephrology.  Continue this collaboration - recent note and labs reviewed.      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)    Chronic, ongoing, followed by nephrology.  Continue this collaboration - recent note and labs reviewed.  Urine ALB 150 today.      Relevant Orders   Microalbumin, Urine Waived   Poorly controlled type 2 diabetes mellitus ([place]) - Primary    Chronic, stable on recent A1c with endo = 7.2%.  Continue current medication regimen as prescribed by endo and collaboration with them.  BMP today, obtain urine ALB today.  Continue focus on vegan style diet at home.  Return in 3 months.      Relevant Orders   Microalbumin, Urine Waived     Other   Long term (current) use of insulin ([place])    Ongoing, continue collaboration with endocrinology.      Obesity    BMI 43.73 with T2DM, HF, CKD.  Recommended eating smaller high protein, low fat meals more frequently and exercising 30 mins a day 5 times a week with a goal of 10-15lb weight loss in the next 3 months. Patient voiced their understanding and motivation to adhere to these recommendations.       Other Visit Diagnoses     Flu vaccine need       Flu vaccine today   Relevant Orders   Flu Vaccine QUAD 6+ mos PF IM (Fluarix Quad PF) (Completed)        Follow up plan: Return in about 6 months (around 05/15/2022) for T2DM, CKD, HF/HTN/HLD.

## 2021-11-14 NOTE — Assessment & Plan Note (Addendum)
Stable, no further discomfort.  Continue current diabetic regimen as ordered by endo and collaboration with both GI and endocrinology.  Continue to collaborate with CCM team as needed.  Check Mag, Phosphorous, and TSH today. Return in 3 months.

## 2021-11-15 LAB — TSH: TSH: 1.76 u[IU]/mL (ref 0.450–4.500)

## 2021-11-15 LAB — BASIC METABOLIC PANEL
BUN/Creatinine Ratio: 18 (ref 9–23)
BUN: 39 mg/dL — ABNORMAL HIGH (ref 6–24)
CO2: 22 mmol/L (ref 20–29)
Calcium: 9.3 mg/dL (ref 8.7–10.2)
Chloride: 101 mmol/L (ref 96–106)
Creatinine, Ser: 2.11 mg/dL — ABNORMAL HIGH (ref 0.57–1.00)
Glucose: 92 mg/dL (ref 70–99)
Potassium: 5.3 mmol/L — ABNORMAL HIGH (ref 3.5–5.2)
Sodium: 134 mmol/L (ref 134–144)
eGFR: 30 mL/min/{1.73_m2} — ABNORMAL LOW (ref >59)

## 2021-11-15 LAB — PHOSPHORUS: Phosphorus: 3.4 mg/dL (ref 3.0–4.3)

## 2021-11-15 LAB — MAGNESIUM: Magnesium: 2.2 mg/dL (ref 1.6–2.3)

## 2021-11-15 NOTE — Progress Notes (Signed)
Contacted via MyChart   Good morning [name] [name], your labs have returned: - Kidney function remains at baseline for you in regard to kidney disease, no worsening. - Your potassium level remains elevated -- please ensure you are taking Lokelma every day and not missing any doses, recommend this get rechecked at next provider visit. - Thyroid, magnesium, and phosphorous levels are all normal.  Any questions? Keep being awesome!!  Thank you for allowing me to participate in your care.  I appreciate you. Kindest regards, Lorelai Huyser

## 2021-11-19 ENCOUNTER — Encounter: Payer: Self-pay | Admitting: Nurse Practitioner

## 2021-11-20 MED ORDER — BENZONATATE 100 MG PO CAPS: 100.0000 mg | ORAL_CAPSULE | Freq: Two times a day (BID) | ORAL | 12 refills | Status: DC | PRN

## 2021-11-26 DIAGNOSIS — R801 Persistent proteinuria, unspecified: Secondary | ICD-10-CM | POA: Diagnosis not present

## 2021-11-26 DIAGNOSIS — E1129 Type 2 diabetes mellitus with other diabetic kidney complication: Secondary | ICD-10-CM | POA: Diagnosis not present

## 2021-11-26 DIAGNOSIS — N2581 Secondary hyperparathyroidism of renal origin: Secondary | ICD-10-CM | POA: Diagnosis not present

## 2021-11-26 DIAGNOSIS — N1832 Chronic kidney disease, stage 3b: Secondary | ICD-10-CM | POA: Diagnosis not present

## 2021-11-26 DIAGNOSIS — E875 Hyperkalemia: Secondary | ICD-10-CM | POA: Diagnosis not present

## 2021-11-26 DIAGNOSIS — I10 Essential (primary) hypertension: Secondary | ICD-10-CM | POA: Diagnosis not present

## 2021-12-05 DIAGNOSIS — E113512 Type 2 diabetes mellitus with proliferative diabetic retinopathy with macular edema, left eye: Secondary | ICD-10-CM | POA: Diagnosis not present

## 2021-12-05 DIAGNOSIS — H4311 Vitreous hemorrhage, right eye: Secondary | ICD-10-CM | POA: Diagnosis not present

## 2021-12-05 DIAGNOSIS — E113551 Type 2 diabetes mellitus with stable proliferative diabetic retinopathy, right eye: Secondary | ICD-10-CM | POA: Diagnosis not present

## 2022-01-02 DIAGNOSIS — E113512 Type 2 diabetes mellitus with proliferative diabetic retinopathy with macular edema, left eye: Secondary | ICD-10-CM | POA: Diagnosis not present

## 2022-01-02 DIAGNOSIS — E113551 Type 2 diabetes mellitus with stable proliferative diabetic retinopathy, right eye: Secondary | ICD-10-CM | POA: Diagnosis not present

## 2022-01-02 DIAGNOSIS — H4311 Vitreous hemorrhage, right eye: Secondary | ICD-10-CM | POA: Diagnosis not present

## 2022-01-15 ENCOUNTER — Encounter: Payer: Self-pay | Admitting: Nurse Practitioner

## 2022-01-23 ENCOUNTER — Ambulatory Visit (INDEPENDENT_AMBULATORY_CARE_PROVIDER_SITE_OTHER): Payer: Medicare Other | Admitting: Nurse Practitioner

## 2022-01-23 ENCOUNTER — Other Ambulatory Visit: Payer: Self-pay

## 2022-01-23 ENCOUNTER — Encounter: Payer: Self-pay | Admitting: Nurse Practitioner

## 2022-01-23 VITALS — BP 92/64 | HR 74 | Temp 98.5°F | Ht 65.0 in | Wt 261.8 lb

## 2022-01-23 DIAGNOSIS — J3089 Other allergic rhinitis: Secondary | ICD-10-CM | POA: Diagnosis not present

## 2022-01-23 DIAGNOSIS — R8281 Pyuria: Secondary | ICD-10-CM

## 2022-01-23 DIAGNOSIS — J3489 Other specified disorders of nose and nasal sinuses: Secondary | ICD-10-CM

## 2022-01-23 DIAGNOSIS — R399 Unspecified symptoms and signs involving the genitourinary system: Secondary | ICD-10-CM | POA: Diagnosis not present

## 2022-01-23 LAB — URINALYSIS, ROUTINE W REFLEX MICROSCOPIC
Bilirubin, UA: NEGATIVE
Glucose, UA: NEGATIVE
Ketones, UA: NEGATIVE
Nitrite, UA: NEGATIVE
Specific Gravity, UA: 1.015 (ref 1.005–1.030)
Urobilinogen, Ur: 0.2 mg/dL (ref 0.2–1.0)
pH, UA: 5.5 (ref 5.0–7.5)

## 2022-01-23 LAB — WET PREP FOR TRICH, YEAST, CLUE
Clue Cell Exam: NEGATIVE
Trichomonas Exam: NEGATIVE
Yeast Exam: NEGATIVE

## 2022-01-23 LAB — MICROSCOPIC EXAMINATION

## 2022-01-23 MED ORDER — FLUCONAZOLE 150 MG PO TABS: 150.0000 mg | ORAL_TABLET | Freq: Once | ORAL | 0 refills | Status: AC

## 2022-01-23 MED ORDER — AMOXICILLIN-POT CLAVULANATE 875-125 MG PO TABS: 1.0000 | ORAL_TABLET | Freq: Two times a day (BID) | ORAL | 0 refills | Status: AC

## 2022-01-23 NOTE — Patient Instructions (Signed)

## 2022-01-23 NOTE — Progress Notes (Signed)
BP 92/64    Pulse 74    Temp 98.5 F (36.9 C) (Oral)    Ht 5\' 5"  (1.651 m)    Wt 261 lb 12.8 oz (118.8 kg)    BMI 43.57 kg/m    Subjective:    Patient ID: [name] [name], female    DOB: 29-Apr-1981, 41 y.o.   MRN: [id number]  HPI: [name] [name] is a 41 y.o. female  Chief Complaint  Patient presents with   Painful urination    Fatigue, nausea, fever, cramps, painful urination and back pain   URINARY SYMPTOMS Had symptoms of UTI for one week, she had a couple leftover Cipro she took at home.  This did not help. Dysuria: yes Urinary frequency: yes Urgency: yes Small volume voids: yes Symptom severity: no Urinary incontinence: yes Foul odor: yes Hematuria: none Abdominal pain: yes Back pain: yes Suprapubic pain/pressure: yes Flank pain: no Fever:  low grade -- broke 1 1/2 days ago Vomiting:  nausea only Relief with cranberry juice: yes Status: stable Previous urinary tract infection: yes Recurrent urinary tract infection: no Sexual activity:monogamous History of sexually transmitted disease: no Treatments attempted: antibiotics, cranberry, and increasing fluids    SINUS ISSUES Has been ongoing for over a month, did take Claritin and Flonase.  She has never had allergy testing. Reports she has a flap of skin in her right nare that at time blocks her airway.   Fever:  intermittent Cough:  occasional Shortness of breath: no Wheezing: no Chest pain: no Chest tightness: no Chest congestion: no Nasal congestion: yes Runny nose: yes Post nasal drip: yes Sneezing: no Sore throat: yes Swollen glands: no Sinus pressure: yes Headache: yes Face pain: yes Toothache:  a few times Ear pain: a couple weeks ago Ear pressure: none Eyes red/itching:yes Eye drainage/crusting: no  Vomiting: no Rash: no Fatigue: yes Sick contacts: no Strep contacts: no  Context: fluctuating Recurrent sinusitis: no Relief with OTC cold/cough medications: no  Treatments attempted:  Claritin and Allegra   Relevant past medical, surgical, family and social history reviewed and updated as indicated. Interim medical history since our last visit reviewed. Allergies and medications reviewed and updated.  Review of Systems  Constitutional:  Negative for activity change, appetite change, diaphoresis, fatigue and fever.  HENT:  Positive for congestion, postnasal drip, rhinorrhea, sinus pressure, sinus pain and sore throat. Negative for ear discharge, ear pain and voice change.   Respiratory:  Negative for cough, chest tightness and shortness of breath.   Cardiovascular:  Negative for chest pain, palpitations and leg swelling.  Gastrointestinal:  Positive for abdominal pain and nausea. Negative for abdominal distention, constipation, diarrhea and vomiting.  Endocrine: Negative for cold intolerance, heat intolerance, polydipsia, polyphagia and polyuria.  Genitourinary:  Positive for decreased urine volume, dysuria, frequency and urgency. Negative for hematuria, pelvic pain, vaginal discharge and vaginal pain.  Neurological:  Positive for headaches. Negative for dizziness, syncope, weakness, light-headedness and numbness.  Psychiatric/Behavioral: Negative.     Per HPI unless specifically indicated above     Objective:    BP 92/64    Pulse 74    Temp 98.5 F (36.9 C) (Oral)    Ht 5\' 5"  (1.660 m)    Wt 261 lb 12.8 oz (118.8 kg)    BMI 43.57 kg/m   Wt Readings from Last 3 Encounters:  01/23/22 261 lb 12.8 oz (118.8 kg)  11/14/21 262 lb 12.8 oz (119.2 kg)  09/24/21 263 lb (119.3 kg)    Physical Exam  Vitals and nursing note reviewed.  Constitutional:      General: She is awake. She is not in acute distress.    Appearance: She is well-developed and well-groomed. She is obese. She is not ill-appearing or toxic-appearing.  HENT:     Head: Normocephalic.     Right Ear: Hearing, ear canal and external ear normal. A middle ear effusion is present.     Left Ear: Hearing, ear canal  and external ear normal. A middle ear effusion is present.     Nose: Nasal deformity (scar tissue lateral aspect right nare) and rhinorrhea present. Rhinorrhea is clear.     Right Sinus: No maxillary sinus tenderness or frontal sinus tenderness.     Left Sinus: No maxillary sinus tenderness or frontal sinus tenderness.     Mouth/Throat:     Mouth: Mucous membranes are moist.     Pharynx: Posterior oropharyngeal erythema (mild with cobblestoning) present. No pharyngeal swelling or oropharyngeal exudate.     Tonsils: No tonsillar exudate. 0 on the right. 0 on the left.  Eyes:     General: Lids are normal.        Right eye: No discharge.        Left eye: No discharge.     Conjunctiva/sclera: Conjunctivae normal.     Pupils: Pupils are equal, round, and reactive to light.  Neck:     Thyroid: No thyromegaly.     Vascular: No carotid bruit.  Cardiovascular:     Rate and Rhythm: Normal rate and regular rhythm.     Heart sounds: Normal heart sounds. No murmur heard.   No gallop.  Pulmonary:     Effort: Pulmonary effort is normal. No accessory muscle usage or respiratory distress.     Breath sounds: Normal breath sounds.  Abdominal:     General: Bowel sounds are normal. There is no distension.     Palpations: Abdomen is soft.     Tenderness: There is abdominal tenderness in the suprapubic area. There is no right CVA tenderness or left CVA tenderness.  Musculoskeletal:     Cervical back: Normal range of motion and neck supple.     Right lower leg: No edema.     Left lower leg: No edema.  Skin:    General: Skin is warm and dry.  Neurological:     Mental Status: She is alert and oriented to person, place, and time.  Psychiatric:        Attention and Perception: Attention normal.        Mood and Affect: Mood normal.        Behavior: Behavior normal. Behavior is cooperative.        Thought Content: Thought content normal.        Judgment: Judgment normal.    Results for orders placed or  performed in visit on 01/23/22  Microscopic Examination  Result Value Ref Range   WBC, UA 0-5 0 - 5 /hpf   RBC 0-2 0 - 2 /hpf   Epithelial Cells (non renal) 0-10 0 - 10 /hpf   Casts Present (A) None seen /lpf   Cast Type Hyaline casts N/A   Mucus, UA Present (A) Not Estab.   Bacteria, UA Moderate (A) None seen/Few  WET PREP FOR TRICH, YEAST, CLUE   Urine  Result Value Ref Range   Trichomonas Exam Negative Negative   Yeast Exam Negative Negative   Clue Cell Exam Negative Negative  Urinalysis, Routine w reflex microscopic  Result  Value Ref Range   Specific Gravity, UA 1.015 1.005 - 1.030   pH, UA 5.5 5.0 - 7.5   Color, UA Yellow Yellow   Appearance Ur Cloudy (A) Clear   Leukocytes,UA Trace (A) Negative   Protein,UA 2+ (A) Negative/Trace   Glucose, UA Negative Negative   Ketones, UA Negative Negative   RBC, UA Trace (A) Negative   Bilirubin, UA Negative Negative   Urobilinogen, Ur 0.2 0.2 - 1.0 mg/dL   Nitrite, UA Negative Negative   Microscopic Examination See below:       Assessment & Plan:   Problem List Items Addressed This Visit       Respiratory   Non-seasonal allergic rhinitis    Ongoing with poor response to OTC medications.  At this time would benefit from allergy testing and further assessment of sinuses.  Will place referral to ENT for further evaluation and recommendations.      Relevant Orders   Ambulatory referral to ENT     Other   Urinary symptom or sign - Primary    Acute and ongoing with wet prep negative and UA moderate bacteria and leuks.  At this time will send for culture.  Start Augmentin BID and send in Diflucan to help avoid yeast infection with this.  Discussed with patient if culture shows poor coverage with Augmentin will change to alternate abx.  Return in 4 weeks for recheck.      Relevant Orders   Urinalysis, Routine w reflex microscopic (Completed)   WET PREP FOR [place], YEAST, CLUE   Other Visit Diagnoses     Pyuria       Urine  sent for culture   Relevant Orders   Urine Culture   Sinus pain       Referral to ENT sent for this chronic issue.   Relevant Orders   Ambulatory referral to ENT        Follow up plan: Return in about 4 weeks (around 02/20/2022) for UTI Follow-up.

## 2022-01-23 NOTE — Assessment & Plan Note (Signed)
Acute and ongoing with wet prep negative and UA moderate bacteria and leuks.  At this time will send for culture.  Start Augmentin BID and send in Diflucan to help avoid yeast infection with this.  Discussed with patient if culture shows poor coverage with Augmentin will change to alternate abx.  Return in 4 weeks for recheck.

## 2022-01-23 NOTE — Assessment & Plan Note (Signed)
Ongoing with poor response to OTC medications.  At this time would benefit from allergy testing and further assessment of sinuses.  Will place referral to ENT for further evaluation and recommendations.

## 2022-02-03 ENCOUNTER — Encounter: Payer: Self-pay | Admitting: Nurse Practitioner

## 2022-02-04 MED ORDER — DOXYCYCLINE HYCLATE 100 MG PO TABS: 100.0000 mg | ORAL_TABLET | Freq: Two times a day (BID) | ORAL | 0 refills | Status: AC

## 2022-02-06 DIAGNOSIS — E113512 Type 2 diabetes mellitus with proliferative diabetic retinopathy with macular edema, left eye: Secondary | ICD-10-CM | POA: Diagnosis not present

## 2022-02-06 DIAGNOSIS — H4311 Vitreous hemorrhage, right eye: Secondary | ICD-10-CM | POA: Diagnosis not present

## 2022-02-07 ENCOUNTER — Other Ambulatory Visit: Payer: Self-pay

## 2022-02-07 ENCOUNTER — Other Ambulatory Visit: Payer: Self-pay | Admitting: Cardiovascular Disease

## 2022-02-07 ENCOUNTER — Telehealth: Payer: Self-pay

## 2022-02-07 ENCOUNTER — Encounter: Payer: Self-pay | Admitting: Cardiovascular Disease

## 2022-02-07 MED ORDER — ISOSORBIDE MONONITRATE ER 120 MG PO TB24
120.0000 mg | ORAL_TABLET | Freq: Every day | ORAL | 1 refills | Status: DC
Start: 2022-02-07 — End: 2022-04-04

## 2022-02-07 NOTE — Telephone Encounter (Signed)
Left message for patient to give our office a call back. Per Jolene, patient is to have a follow up appointment with her Cardiologist before she can be cleared for surgery. Jolene says if the patient would like she can have the patient come into our office for the Urinalysis, but patient has to scheduled an appointment with her Cardiologist in order to have surgery. ? ?OK for PEC/Nurse Triage to give note if patient calls back.  ?

## 2022-02-07 NOTE — Telephone Encounter (Signed)
Pt called back and given the message word for word. ?Pt verbalized understanding.  However, she would like to know why she needs a UA in order to have her eye surgery? ?

## 2022-02-08 ENCOUNTER — Telehealth: Payer: Self-pay | Admitting: Nurse Practitioner

## 2022-02-08 NOTE — Telephone Encounter (Signed)
Left a message for patient to give our office to make patient aware of Jolene's recommendations regarding the Urinalysis.  ? ?OK for PEC/Nurse Triage to give note if patient calls back.  ?

## 2022-02-08 NOTE — Telephone Encounter (Signed)
Left a message for Rinaldo Cloud, NP providing her with Jolene's recommendations. Advised Rinaldo Cloud, NP to give our office a call back if she has any questions or concerns. ?

## 2022-02-08 NOTE — Telephone Encounter (Signed)
Rinaldo Cloud, Nurse Practitioner from [facility] following up on a form that was faxed on 02/06/2022 due to patient BP being borderline high. Patient has an upcoming surgery due to retinal bleeding. Caller states she would like patient to see PCP prior to surgery, scheduled on 02/26/2022 to discuss BP. If patient BP is high the day of surgery they may have to cancel. Advised caller patient has appointment on 02/20/2022. Please note if form was received.   ? ?

## 2022-02-17 NOTE — Patient Instructions (Signed)

## 2022-02-20 ENCOUNTER — Telehealth: Payer: Self-pay

## 2022-02-20 ENCOUNTER — Telehealth: Payer: Self-pay | Admitting: Nurse Practitioner

## 2022-02-20 ENCOUNTER — Other Ambulatory Visit: Payer: Self-pay

## 2022-02-20 ENCOUNTER — Ambulatory Visit (INDEPENDENT_AMBULATORY_CARE_PROVIDER_SITE_OTHER): Payer: Medicare Other | Admitting: Nurse Practitioner

## 2022-02-20 ENCOUNTER — Encounter: Payer: Self-pay | Admitting: Nurse Practitioner

## 2022-02-20 DIAGNOSIS — N183 Chronic kidney disease, stage 3 unspecified: Secondary | ICD-10-CM

## 2022-02-20 DIAGNOSIS — I130 Hypertensive heart and chronic kidney disease with heart failure and stage 1 through stage 4 chronic kidney disease, or unspecified chronic kidney disease: Secondary | ICD-10-CM | POA: Diagnosis not present

## 2022-02-20 NOTE — Progress Notes (Signed)
? ?BP 132/84 (BP Location: Left Arm)   Pulse 75   Temp 98.7 ?F (37.1 ?C) (Oral)   Ht '5\' 5"'$  (1.651 m)   Wt 269 lb 6.4 oz (122.2 kg)   BMI 44.83 kg/m?   ? ?Subjective:  ? ? Patient ID: [name] [name], female    DOB: May 27, 1981, 41 y.o.   MRN: [id number] ? ?HPI: ?[name] [name] is a 41 y.o. female ? ?Chief Complaint  ?Patient presents with  ? Hypertension  ? Pre-op Exam  ?  Patient states she is here for her surgical clearance. Patient states she is unable to see Cardiology until April and her surgery is scheduled for the end of the month. Patient states she was told that she did not have to see Cardiology. Patient would like to discuss with provider at today's visit.   ? ?HYPERTENSION / HYPERLIPIDEMIA ?Followed by cardiology and last seen 02/09/21. Is statin intolerant and takes Praluent.  Is having eye surgery for with [facility], needs clearance -- has had clearance from cardiology recently for all eye surgeries she has been having.  [facility] wanted clearance currently as during recent visit her BP was elevated 160/110 with them and they were concerned. ?Satisfied with current treatment? yes ?Duration of hypertension: chronic ?BP monitoring frequency: daily ?BP range: 120-140/80's ?BP medication side effects: no ?Duration of hyperlipidemia: chronic ?Cholesterol supplements: none ?Medication compliance: good compliance ?Aspirin: yes ?Recent stressors: no ?Recurrent headaches: no ?Visual changes: no ?Palpitations: no ?Dyspnea: no ?Chest pain: no ?Lower extremity edema: none ?Dizzy/lightheaded: occasional with low BP levels at times ? ?Relevant past medical, surgical, family and social history reviewed and updated as indicated. Interim medical history since our last visit reviewed. ?Allergies and medications reviewed and updated. ? ?Review of Systems  ?Constitutional:  Negative for activity change, appetite change, diaphoresis, fatigue and fever.  ?Respiratory:  Negative for cough, chest tightness and shortness  of breath.   ?Cardiovascular:  Negative for chest pain, palpitations and leg swelling.  ?Gastrointestinal:  Negative for abdominal distention, abdominal pain, constipation, diarrhea, nausea and vomiting.  ?Endocrine: Negative for cold intolerance, heat intolerance, polydipsia, polyphagia and polyuria.  ?Neurological:  Negative for dizziness, syncope, weakness, light-headedness, numbness and headaches.  ?Psychiatric/Behavioral: Negative.    ? ?Per HPI unless specifically indicated above ? ?   ?Objective:  ?  ?BP 132/84 (BP Location: Left Arm)   Pulse 75   Temp 98.7 ?F (37.1 ?C) (Oral)   Ht '5\' 5"'$  (1.651 m)   Wt 269 lb 6.4 oz (122.2 kg)   BMI 44.83 kg/m?   ?Wt Readings from Last 3 Encounters:  ?02/20/22 269 lb 6.4 oz (122.2 kg)  ?01/23/22 261 lb 12.8 oz (118.8 kg)  ?11/14/21 262 lb 12.8 oz (119.2 kg)  ?  ?Physical Exam ?Vitals and nursing note reviewed.  ?Constitutional:   ?   General: She is awake. She is not in acute distress. ?   Appearance: She is well-developed. She is obese. She is not ill-appearing.  ?HENT:  ?   Head: Normocephalic.  ?   Right Ear: Hearing normal.  ?   Left Ear: Hearing normal.  ?Eyes:  ?   General: Lids are normal.     ?   Right eye: No discharge.     ?   Left eye: No discharge.  ?   Conjunctiva/sclera: Conjunctivae normal.  ?   Pupils: Pupils are equal, round, and reactive to light.  ?Neck:  ?   Thyroid: No thyromegaly.  ?  Vascular: No carotid bruit.  ?Cardiovascular:  ?   Rate and Rhythm: Normal rate and regular rhythm.  ?   Heart sounds: Normal heart sounds. No murmur heard. ?  No gallop.  ?Pulmonary:  ?   Effort: Pulmonary effort is normal. No accessory muscle usage or respiratory distress.  ?   Breath sounds: Normal breath sounds.  ?Abdominal:  ?   General: Bowel sounds are normal.  ?   Palpations: Abdomen is soft. There is no hepatomegaly or splenomegaly.  ?   Tenderness: There is no abdominal tenderness.  ?Musculoskeletal:  ?   Cervical back: Normal range of motion and neck  supple.  ?   Right lower leg: No edema.  ?   Left lower leg: No edema.  ?Skin: ?   General: Skin is warm and dry.  ?Neurological:  ?   Mental Status: She is alert and oriented to person, place, and time.  ?Psychiatric:     ?   Attention and Perception: Attention normal.     ?   Mood and Affect: Mood normal.     ?   Behavior: Behavior normal. Behavior is cooperative.     ?   Thought Content: Thought content normal.     ?   Judgment: Judgment normal.  ? ? ?Results for orders placed or performed in visit on 01/23/22  ?Microscopic Examination  ?Result Value Ref Range  ? WBC, UA 0-5 0 - 5 /hpf  ? RBC 0-2 0 - 2 /hpf  ? Epithelial Cells (non renal) 0-10 0 - 10 /hpf  ? Casts Present (A) None seen /lpf  ? Cast Type Hyaline casts N/A  ? Mucus, UA Present (A) Not Estab.  ? Bacteria, UA Moderate (A) None seen/Few  ?WET PREP FOR [place], YEAST, CLUE  ? Urine  ?Result Value Ref Range  ? Trichomonas Exam Negative Negative  ? Yeast Exam Negative Negative  ? Clue Cell Exam Negative Negative  ?Urinalysis, Routine w reflex microscopic  ?Result Value Ref Range  ? Specific Gravity, UA 1.015 1.005 - 1.030  ? pH, UA 5.5 5.0 - 7.5  ? Color, UA Yellow Yellow  ? Appearance Ur Cloudy (A) Clear  ? Leukocytes,UA Trace (A) Negative  ? Protein,UA 2+ (A) Negative/Trace  ? Glucose, UA Negative Negative  ? Ketones, UA Negative Negative  ? RBC, UA Trace (A) Negative  ? Bilirubin, UA Negative Negative  ? Urobilinogen, Ur 0.2 0.2 - 1.0 mg/dL  ? Nitrite, UA Negative Negative  ? Microscopic Examination See below:   ? ?   ?Assessment & Plan:  ? ?Problem List Items Addressed This Visit   ? ?  ? Cardiovascular and Mediastinum  ? 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  Chronic, ongoing.  BP at goal today on recheck.  Recommend she monitor BP at least a few mornings a week at home and document.  DASH diet at home.  Continue current medication regimen and adjust as needed -- continue collaboration with cardiology.  She is  cleared for eye surgery and forms will be faxed on this. ? ?  ?  ?  ? ?Follow up plan: ?Return for return as scheduled 05/21/22. ?

## 2022-02-20 NOTE — Telephone Encounter (Signed)
Copied from [place] [phone]. Topic: General - Other ?>> Feb 20, 2022 12:23 PM Alanda Slim E wrote: ?Reason for CRM: form was faxed over / they need statement that pt is cleared for eye surgery and office visit note  / fax# [phone] ?

## 2022-02-20 NOTE — Telephone Encounter (Signed)
Form and office visit note faxed.  ?

## 2022-02-20 NOTE — Assessment & Plan Note (Signed)
Chronic, ongoing.  BP at goal today on recheck.  Recommend she monitor BP at least a few mornings a week at home and document.  DASH diet at home.  Continue current medication regimen and adjust as needed -- continue collaboration with cardiology.  She is cleared for eye surgery and forms will be faxed on this. ? ?

## 2022-02-20 NOTE — Telephone Encounter (Signed)
? ?  Pre-operative Risk Assessment  ?  ?Patient Name: [name] [name]  ?DOB: 1981/10/29 ?MRN: [id number]  ? ?  ? ?Request for Surgical Clearance   ? ?Procedure:   VITRECTOMY W/ENDO LASER RIGHT EYE ? ?Date of Surgery:  Clearance 02/26/22                              ?   ?Surgeon:  DR Dennie Fetters ?Surgeon's Group or Practice Name:  [facility] ?Phone number:  [phone] ?Fax number:  [phone] ?  ?Type of Clearance Requested:   ?- Medical  ?  ?Type of Anesthesia:   IV SEDATION ?  ?Additional requests/questions:   ? ? ? ?

## 2022-02-21 ENCOUNTER — Encounter: Payer: Self-pay | Admitting: Student

## 2022-02-21 ENCOUNTER — Ambulatory Visit (INDEPENDENT_AMBULATORY_CARE_PROVIDER_SITE_OTHER): Payer: Medicare Other | Admitting: Student

## 2022-02-21 ENCOUNTER — Telehealth: Payer: Self-pay | Admitting: *Deleted

## 2022-02-21 DIAGNOSIS — Z01810 Encounter for preprocedural cardiovascular examination: Secondary | ICD-10-CM | POA: Diagnosis not present

## 2022-02-21 NOTE — Telephone Encounter (Signed)
Pt agreeable to plan of care for tele appt today for pre op clearance @ 4 pm, med rec and consent done. Pt tells me that the clearance was only for PCP really because her BP was high. I explained as they are requesting cardiac clearance better to just do tele visit and be sure that ok to proceed. Pt is agreeable and thanked me for the call. I did tell the pt though to be sure to mention to Pre op provider Sande Rives, [facility] the information she had stated to me as well.   ?

## 2022-02-21 NOTE — Telephone Encounter (Signed)
?  Patient Consent for Virtual Visit  ? ? ?   ? ?[name] [name] has provided verbal consent on 02/21/2022 for a virtual visit (video or telephone). ? ? ?CONSENT FOR VIRTUAL VISIT FOR:  [name] [name]  ?By participating in this virtual visit I agree to the following: ? ?I hereby voluntarily request, consent and authorize [facility] and its employed or contracted physicians, physician assistants, nurse practitioners or other licensed health care professionals (the Practitioner), to provide me with telemedicine health care services (the ?Services") as deemed necessary by the treating Practitioner. I acknowledge and consent to receive the Services by the Practitioner via telemedicine. I understand that the telemedicine visit will involve communicating with the Practitioner through live audiovisual communication technology and the disclosure of certain medical information by electronic transmission. I acknowledge that I have been given the opportunity to request an in-person assessment or other available alternative prior to the telemedicine visit and am voluntarily participating in the telemedicine visit. ? ?I understand that I have the right to withhold or withdraw my consent to the use of telemedicine in the course of my care at any time, without affecting my right to future care or treatment, and that the Practitioner or I may terminate the telemedicine visit at any time. I understand that I have the right to inspect all information obtained and/or recorded in the course of the telemedicine visit and may receive copies of available information for a reasonable fee.  I understand that some of the potential risks of receiving the Services via telemedicine include:  ?Delay or interruption in medical evaluation due to technological equipment failure or disruption; ?Information transmitted may not be sufficient (e.g. poor resolution of images) to allow for appropriate medical decision making by the Practitioner; and/or   ?In rare instances, security protocols could fail, causing a breach of personal health information. ? ?Furthermore, I acknowledge that it is my responsibility to provide information about my medical history, conditions and care that is complete and accurate to the best of my ability. I acknowledge that Practitioner's advice, recommendations, and/or decision may be based on factors not within their control, such as incomplete or inaccurate data provided by me or distortions of diagnostic images or specimens that may result from electronic transmissions. I understand that the practice of medicine is not an exact science and that Practitioner makes no warranties or guarantees regarding treatment outcomes. I acknowledge that a copy of this consent can be made available to me via my patient portal ([facility]), or I can request a printed copy by calling the office of [facility].   ? ?I understand that my insurance will be billed for this visit.  ? ?I have read or had this consent read to me. ?I understand the contents of this consent, which adequately explains the benefits and risks of the Services being provided via telemedicine.  ?I have been provided ample opportunity to ask questions regarding this consent and the Services and have had my questions answered to my satisfaction. ?I give my informed consent for the services to be provided through the use of telemedicine in my medical care ? ? ? ?

## 2022-02-21 NOTE — Progress Notes (Signed)
Virtual Visit via Telephone Note   This visit type was conducted due to national recommendations for restrictions regarding the COVID-19 Pandemic (e.g. social distancing) in an effort to limit this patient's exposure and mitigate transmission in our community.  Due to her co-morbid illnesses, this patient is at least at moderate risk for complications without adequate follow up.  This format is felt to be most appropriate for this patient at this time.  The patient did not have access to video technology/had technical difficulties with video requiring transitioning to audio format only (telephone).  All issues noted in this document were discussed and addressed.  No physical exam could be performed with this format.  Please refer to the patient's chart for her  consent to telehealth for [facility]. Evaluation Performed:  Preoperative cardiovascular risk assessment  This visit type was conducted due to national recommendations for restrictions regarding the COVID-19 Pandemic (e.g. social distancing).  This format is felt to be most appropriate for this patient at this time.  All issues noted in this document were discussed and addressed.  No physical exam was performed (except for noted visual exam findings with Video Visits).  Please refer to the patient's chart (MyChart message for video visits and phone note for telephone visits) for the patient's consent to telehealth for [facility]. _____________   Date:  02/21/2022   Patient ID:  [name] [name], [name] [name] 20-Jun-1981, MRN [id number] Patient Location:  Home Provider location:   Office  Primary Care Provider:  Marjie Skiff, NP Primary Cardiologist:  Lorine Bears, MD  Chief Complaint    [name] [name] is a 40 y.o. 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  Past Medical History    Past Medical History:  Diagnosis Date   CAD (coronary artery disease)    a. 03/2015 NSTEMI/PCI: LCX 176m (DES), OM1 100p (DES - vessel labeled Ramus in subsequent cath report). Minor irregs to LAD/RCA; b. 04/2015 Cath: LM nl, LAD 40p, RI patent stent, LCX patent stent, RCA nl, EF 55-65%; c. 02/2016 MV: basal inferolateral and mid inferolateral defect/infarct w/o ischemia. EF 55-65%; d. 11/2018 PCI: LM 30ost, LAD 30p, LCX 95p/m (PTCA->20%), OM1 10ost, OM2 40ost, RCA 85p.   Chronic abdominal pain    Chronic diastolic (congestive) heart failure (HCC)    a. 03/2015 Echo: EF 30-35%, mild concentric LVH, severe HK of inf, inflat, & lat walls, mod MR, mild TR;  b. 04/2015 LV Gram: EF 55-65%; c. 09/2017 Echo: EF 55-60%, no rwma. Mild MR; d. 11/2018 Echo: Ef 60-65%, no rwma.   CKD (chronic kidney disease), stage III (HCC)    Diabetes type 2, controlled (HCC)    a. since 2002    Diabetic gastroparesis (HCC)    a. chronic nausea/vomiting.   Diabetic retinopathy (HCC)    Diverticulosis, sigmoid 07/2016   Fatty liver    GERD (gastroesophageal reflux disease)    Heavy menses    a. H/O IUD - expired in 2014 - remains in  place.   Hx of migraines    Hyperlipidemia    Intolerant of atorvastatin, rosuvastatin   Hypertension    Iron deficiency anemia    Ischemic cardiomyopathy (resolved)    a. 03/2015 EF 30-35% post NSTEMI;  b. 04/2015 EF 55-65% on LV gram; c. 09/2017 Echo: EF 55-60%; d. 11/2018 Echo: Ef 60-65%.   Obesity    Tobacco abuse    a. quit 03/2015.   Uterine fibroid    Vertigo    Vitamin D deficiency    Past  Surgical History:  Procedure Laterality Date   APPENDECTOMY     CARDIAC CATHETERIZATION  03/2015   x2 stent [facility]   CARDIAC CATHETERIZATION N/A 05/05/2015   Procedure: Left Heart Cath and Coronary Angiography;  Surgeon: Peter M Swaziland, MD;  Location[facility] INVASIVE CV LAB;  Service: Cardiovascular;  Laterality: N/A;   CATARACT EXTRACTION Left 09/12/2021   CATARACT EXTRACTION Right 10/10/2021   COLONOSCOPY WITH PROPOFOL N/A 07/31/2016   Procedure: COLONOSCOPY WITH PROPOFOL;  Surgeon: Christena Deem, MD;  Location: [facility] ENDOSCOPY;  Service: Endoscopy;  Laterality: N/A;   CORONARY BALLOON ANGIOPLASTY N/A 12/04/2018   Procedure: CORONARY BALLOON ANGIOPLASTY;  Surgeon: Iran Ouch, MD;  Location: [facility] INVASIVE CV LAB;  Service: Cardiovascular;  Laterality: N/A;   CORONARY STENT INTERVENTION N/A 01/12/2019   Procedure: CORONARY STENT INTERVENTION;  Surgeon: Yvonne Kendall, MD;  Location: [facility] INVASIVE CV LAB;  Service: Cardiovascular;  Laterality: N/A;   ESOPHAGOGASTRODUODENOSCOPY (EGD) WITH PROPOFOL N/A 07/31/2016   Procedure: ESOPHAGOGASTRODUODENOSCOPY (EGD) WITH PROPOFOL;  Surgeon: Christena Deem, MD;  Location: [facility] ENDOSCOPY;  Service: Endoscopy;  Laterality: N/A;   INTRAVASCULAR PRESSURE WIRE/FFR STUDY N/A 12/04/2018   Procedure: INTRAVASCULAR PRESSURE WIRE/FFR STUDY;  Surgeon: Iran Ouch, MD;  Location: [facility] INVASIVE CV LAB;  Service: Cardiovascular;  Laterality: N/A;   LAPAROSCOPIC APPENDECTOMY N/A 08/07/2016   Procedure: APPENDECTOMY LAPAROSCOPIC;  Surgeon: Gladis Riffle, MD;  Location: [facility] ORS;  Service: General;  Laterality: N/A;   LEFT HEART CATH AND CORONARY ANGIOGRAPHY Left 12/04/2018   Procedure: LEFT HEART CATH AND CORONARY ANGIOGRAPHY;  Surgeon: Iran Ouch, MD;  Location: [facility] INVASIVE CV LAB;  Service: Cardiovascular;  Laterality: Left;   LEFT HEART CATH AND CORONARY ANGIOGRAPHY N/A 01/12/2019   Procedure: LEFT HEART CATH AND CORONARY ANGIOGRAPHY;   Surgeon: Yvonne Kendall, MD;  Location: [facility] INVASIVE CV LAB;  Service: Cardiovascular;  Laterality: N/A;   MOUTH SURGERY     RIGHT/LEFT HEART CATH AND CORONARY ANGIOGRAPHY N/A 01/29/2021   Procedure: RIGHT/LEFT HEART CATH AND CORONARY ANGIOGRAPHY;  Surgeon: Iran Ouch, MD;  Location: [facility] INVASIVE CV LAB;  Service: Cardiovascular;  Laterality: N/A;   VITRECTOMY Left 10/30/2021    Allergies  Allergies  Allergen Reactions   Lisinopril Anaphylaxis, Itching and Swelling   Other Itching and Swelling    [place] seasoning    Rosemary Oil Anaphylaxis   Shellfish Allergy Itching and Swelling    Pt states that she HAS had IV Contrast without any reaction   Statins Other (See Comments)    Myalgias   Metformin And Related Diarrhea, Nausea And Vomiting and Rash    History of Present Illness    [name] [name] is a 41 y.o. female who presents via audio/video conferencing for a telehealth visit today.  Patient was last seen in cardiology clinic on 08/16/2021 by Dr. Kirke Corin. At that time, [name] [name] was doing well.  she is now pending a vitrectomy with endolaser of right eye.  Since  his last visit, she has been doing well. She denies any chest pain, shortness of breath, orthopnea, PND, or edema. No significant palpitations. She does report some lightheadedness/dizziness when her BP is on the lower side but no syncope. She monitors her BP closely at home. Advised patient to continue to monitor BP closely at home and let us know if it is consistently low or if her lightheadedness/dizziness worsens. She is able to complete >4.0 METS of activity without any problems.  Home Medications    Prior to Admission medications   Medication Sig Start Date End Date Taking? Authorizing Provider  ACCU-CHEK AVIVA PLUS test strip USE 1 STRIP TO CHECK GLUCOSE THREE TIMES DAILY 07/19/19   [provider]  acetaminophen (TYLENOL) 500 MG tablet Take 1,000 mg by mouth every 4 (four) hours as needed.     [provider]  albuterol (VENTOLIN HFA) 108 (90 Base) MCG/ACT inhaler Inhale into the lungs. 02/19/17   [provider]  Alirocumab (PRALUENT) 150 MG/ML SOAJ Inject 1 mL into the skin every 14 (fourteen) days. 02/14/21   Iran Ouch, MD  aspirin EC 81 MG tablet Take 81 mg by mouth daily.    [provider]  BD PEN NEEDLE MICRO U/F 32G X 6 MM MISC Inject into the skin. 08/03/21   [provider]  benzonatate (TESSALON) 100 MG capsule Take 1 capsule (100 mg total) by mouth 2 (two) times daily as needed for cough. 11/20/21   Cannady, Corrie Dandy T, NP  carvedilol (COREG) 25 MG tablet Take 1 tablet (25 mg total) by mouth 2 (two) times daily. 02/09/21 02/21/22  Iran Ouch, MD  clopidogrel (PLAVIX) 75 MG tablet Take 1 tablet (75 mg total) by mouth daily. 05/28/21   Iran Ouch, MD  Continuous Blood Gluc Receiver (DEXCOM G6 RECEIVER) DEVI 1 PER 5 YEARS 04/19/21   [provider]  Continuous Blood Gluc Sensor (DEXCOM G6 SENSOR) MISC See admin instructions. 09/10/21   [provider]  Continuous Blood Gluc Transmit (DEXCOM G6 TRANSMITTER) MISC Use 1 each every 90 days 09/25/21   [provider]  etonogestrel (NEXPLANON) 68 MG IMPL implant 1 each (68 mg total) by Subdermal route once for 1 dose. 08/17/21 02/21/22  Copland, Ilona Sorrel, PA-C  ezetimibe (ZETIA) 10 MG tablet Take 1 tablet (10 mg total) by mouth daily. 08/16/21 02/21/22  Iran Ouch, MD  famotidine (PEPCID) 40 MG tablet Take 1 tablet (40 mg total) by mouth daily. 09/21/21   Cannady, Corrie Dandy T, NP  fluticasone (FLONASE) 50 MCG/ACT nasal spray Place 2 sprays into both nostrils daily. 10/11/20   Terrilee Files, MD  furosemide (LASIX) 20 MG tablet Takes 20 mg tablet qod and 40 mg on the other days. 08/16/21   Iran Ouch, MD  insulin aspart (NOVOLOG) 100 UNIT/ML injection Inject 10 Units into the skin 3 (three) times daily with meals. Patient not taking: Reported on 02/21/2022  05/04/18   Alford Highland, MD  Insulin Glargine [facility]) 100 UNIT/ML Inject 70 Units into the skin daily.    [provider]  irbesartan (AVAPRO) 75 MG tablet Take 1 tablet by mouth once daily 02/07/22   Iran Ouch, MD  isosorbide mononitrate (IMDUR) 120 MG 24 hr tablet Take 1 tablet (120 mg total) by mouth daily. Please keep scheduled appointment for further refills. 02/07/22 02/02/23  Iran Ouch, MD  LOKELMA 10 g PACK packet Take 10 g by mouth daily. 11/01/20   [provider]  loperamide (IMODIUM) 2 MG capsule Take 2 mg by mouth as needed for diarrhea or loose stools.    [provider]  meclizine (ANTIVERT) 25 MG tablet Take 25 mg by mouth 3 (three) times daily as needed for dizziness. Patient not taking: Reported on 02/21/2022    [provider]  nitroGLYCERIN (NITROSTAT) 0.4 MG SL tablet Take 1 tab under your tongue while sitting for chest pain, If no relief may repeat, one tab every 5 min up to 3 tabs total over 15 mins. 01/10/21   Marisue Ivan D, PA-C  NOVOLOG FLEXPEN 100 UNIT/ML FlexPen SMARTSIG:50 Unit(s) SUB-Q As Directed 09/05/21   [provider]  ofloxacin (OCUFLOX) 0.3 % ophthalmic solution Place 1 drop into the right eye 4 (four) times daily. Patient not taking: Reported on 02/21/2022 02/06/22   [provider]  pantoprazole (PROTONIX) 40 MG tablet Take 1 tablet (40 mg total) by mouth 2 (two) times daily. 10/19/21   Cannady, Corrie Dandy T, NP  promethazine (PHENERGAN) 12.5 MG tablet TAKE 1 TABLET BY MOUTH EVERY 6 HOURS AS NEEDED FOR NAUSEA AND VOMITING 02/18/20   Marjie Skiff, NP    Physical Exam    Vital Signs:  [name] [name] does not have vital signs available for review today.  Given telephonic nature of communication, physical exam is limited: Alert and oriented x3. No acute distress. Normal affect. Speech and respirations are unlabored.  Accessory Clinical Findings    None  Assessment & Plan     Preoperative Cardiovascular Risk Assessment: Patient has upcoming right eye procedure planned. She had same procedure done on her left eye in 10/2021 and tolerated it well. She is doing well from a cardiac standpoint with no angina, acute CHF symptoms, or syncope. She is able to complete >4.0 METS without any problems. Therefore, based on ACC/AHA guidelines, patient would be at acceptable risk for the planned procedure without further cardiovascular testing. Dr. Kirke Corin previously gave the OK to hold Plavix for 5 days prior to a vitrectomy in 10/2021. Patient has had no new cardiac events since that time so we can use this same recommendation. Patient may hold Plavix for 5 days prior to planned procedure if needed but this should be restarted as soon as safely possible afterwards.  I will route this recommendation to the requesting party via Epic fax function.   Time:   Today, I have spent 7 minutes with the patient with telehealth technology discussing medical history, symptoms, and management plan.     [name] Parker, PA-C  02/21/2022, 4:23 PM

## 2022-02-21 NOTE — Telephone Encounter (Signed)
Left message for the pt to call back as she is going to need a tele visit for pre op clearance, see notes. Pt will need to s/w the pre op team ?

## 2022-02-21 NOTE — Telephone Encounter (Signed)
Pre-op covering staff, patient will need a telehealth visit. Can you please call and consent patient for this and add to the APP pre-op schedule? ? ?Of note, Dr. Fletcher Anon previously gave the OK to hold Plavix for 5 days for a vitrectomy in 10/2021 so we should be able to use this same recommendation.   ? ?Thank you! ?

## 2022-02-23 ENCOUNTER — Other Ambulatory Visit: Payer: Self-pay | Admitting: Cardiovascular Disease

## 2022-02-24 ENCOUNTER — Other Ambulatory Visit: Payer: Self-pay | Admitting: Cardiovascular Disease

## 2022-02-26 ENCOUNTER — Encounter: Payer: Self-pay | Admitting: Nurse Practitioner

## 2022-02-26 ENCOUNTER — Ambulatory Visit: Payer: Medicare Other | Admitting: Cardiovascular Disease

## 2022-02-26 DIAGNOSIS — H4311 Vitreous hemorrhage, right eye: Secondary | ICD-10-CM | POA: Diagnosis not present

## 2022-02-26 DIAGNOSIS — H3341 Traction detachment of retina, right eye: Secondary | ICD-10-CM | POA: Diagnosis not present

## 2022-02-26 MED ORDER — CARVEDILOL 25 MG PO TABS: 25.0000 mg | ORAL_TABLET | Freq: Two times a day (BID) | ORAL | 3 refills | Status: DC

## 2022-03-04 DIAGNOSIS — J309 Allergic rhinitis, unspecified: Secondary | ICD-10-CM | POA: Diagnosis not present

## 2022-03-04 DIAGNOSIS — J339 Nasal polyp, unspecified: Secondary | ICD-10-CM | POA: Diagnosis not present

## 2022-03-08 DIAGNOSIS — J301 Allergic rhinitis due to pollen: Secondary | ICD-10-CM | POA: Diagnosis not present

## 2022-03-26 DIAGNOSIS — J309 Allergic rhinitis, unspecified: Secondary | ICD-10-CM | POA: Diagnosis not present

## 2022-03-26 DIAGNOSIS — J339 Nasal polyp, unspecified: Secondary | ICD-10-CM | POA: Diagnosis not present

## 2022-03-27 DIAGNOSIS — J301 Allergic rhinitis due to pollen: Secondary | ICD-10-CM | POA: Diagnosis not present

## 2022-04-01 DIAGNOSIS — R8290 Unspecified abnormal findings in urine: Secondary | ICD-10-CM | POA: Diagnosis not present

## 2022-04-01 DIAGNOSIS — E875 Hyperkalemia: Secondary | ICD-10-CM | POA: Diagnosis not present

## 2022-04-01 DIAGNOSIS — N184 Chronic kidney disease, stage 4 (severe): Secondary | ICD-10-CM | POA: Diagnosis not present

## 2022-04-02 DIAGNOSIS — E1122 Type 2 diabetes mellitus with diabetic chronic kidney disease: Secondary | ICD-10-CM | POA: Diagnosis not present

## 2022-04-02 DIAGNOSIS — I10 Essential (primary) hypertension: Secondary | ICD-10-CM | POA: Diagnosis not present

## 2022-04-02 DIAGNOSIS — R801 Persistent proteinuria, unspecified: Secondary | ICD-10-CM | POA: Diagnosis not present

## 2022-04-02 DIAGNOSIS — N1832 Chronic kidney disease, stage 3b: Secondary | ICD-10-CM | POA: Diagnosis not present

## 2022-04-02 DIAGNOSIS — E875 Hyperkalemia: Secondary | ICD-10-CM | POA: Diagnosis not present

## 2022-04-02 DIAGNOSIS — N2581 Secondary hyperparathyroidism of renal origin: Secondary | ICD-10-CM | POA: Diagnosis not present

## 2022-04-04 ENCOUNTER — Ambulatory Visit (INDEPENDENT_AMBULATORY_CARE_PROVIDER_SITE_OTHER): Payer: Medicare Other | Admitting: Cardiovascular Disease

## 2022-04-04 ENCOUNTER — Other Ambulatory Visit: Payer: Self-pay | Admitting: Cardiovascular Disease

## 2022-04-04 VITALS — BP 120/60 | HR 72 | Ht 65.0 in | Wt 268.5 lb

## 2022-04-04 DIAGNOSIS — I25118 Atherosclerotic heart disease of native coronary artery with other forms of angina pectoris: Secondary | ICD-10-CM

## 2022-04-04 DIAGNOSIS — I10 Essential (primary) hypertension: Secondary | ICD-10-CM | POA: Diagnosis not present

## 2022-04-04 DIAGNOSIS — I5032 Chronic diastolic (congestive) heart failure: Secondary | ICD-10-CM | POA: Diagnosis not present

## 2022-04-04 DIAGNOSIS — E785 Hyperlipidemia, unspecified: Secondary | ICD-10-CM

## 2022-04-04 DIAGNOSIS — E1169 Type 2 diabetes mellitus with other specified complication: Secondary | ICD-10-CM

## 2022-04-04 MED ORDER — ISOSORBIDE MONONITRATE ER 60 MG PO TB24
60.0000 mg | ORAL_TABLET | Freq: Every day | ORAL | 3 refills | Status: DC
Start: 2022-04-04 — End: 2022-05-31

## 2022-04-04 MED ORDER — FUROSEMIDE 20 MG PO TABS
ORAL_TABLET | ORAL | 3 refills | Status: DC
Start: 2022-04-04 — End: 2022-05-15

## 2022-04-04 MED ORDER — CARVEDILOL 12.5 MG PO TABS: 12.5000 mg | ORAL_TABLET | Freq: Two times a day (BID) | ORAL | 1 refills | Status: DC

## 2022-04-04 MED ORDER — PRALUENT 150 MG/ML ~~LOC~~ SOAJ: 1.0000 mL | SUBCUTANEOUS | 11 refills | Status: DC

## 2022-04-04 MED ORDER — CLOPIDOGREL BISULFATE 75 MG PO TABS: 75.0000 mg | ORAL_TABLET | Freq: Every day | ORAL | 3 refills | Status: DC

## 2022-04-04 MED ORDER — EZETIMIBE 10 MG PO TABS: 10.0000 mg | ORAL_TABLET | Freq: Every day | ORAL | 3 refills | Status: DC

## 2022-04-04 NOTE — Progress Notes (Signed)
?  ?Cardiology Office Note ? ? ?Date:  04/04/2022  ? ?ID:  [name] [name], DOB [birth year], MRN [id number] ? ?PCP:  Venita Lick, NP  ?Cardiologist:   Kathlyn Sacramento, MD  ? ?Chief Complaint  ?Patient presents with  ? Other  ?  6 month f/u no complaints today. Meds reviewed verbally with pt.  ? ? ?  ?History of Present Illness: ?[name] [name] is a 41 y.o. female who presents for a follow-up regarding coronary artery disease. ?She has complex past medical history including type 2 diabetes mellitus which was diagnosed 2002, diabetic gastroparesis with chronic nausea and vomiting, coronary artery disease, ischemic cardiomyopathy with subsequent normalization of LV function, stage II-III chronic kidney disease, hypertension, hyperlipidemia, and obesity.  Cardiac history dates back to April 2016, when she was hospitalized with chest pain and non-STEMI.  She was found to have LV dysfunction with an EF of 30 to 35%, and underwent diagnostic catheterization revealing severe left circumflex and OM1 disease.  Both areas were successfully treated with drug-eluting stents.  Follow-up catheterization in May 2016 revealed patency of both stents with normalization of LV function by ventriculography.   Stress testing was performed in March 2017 which was nonischemic and low risk. ?  ?She had recurrent angina in December 2019.  Cardiac catheterization showed patent stent in OM1, focal in-stent restenosis in the left circumflex at the origin of OM 2 and new significant stenosis in the proximal to mid right coronary artery.  I performed balloon angioplasty of the mid left circumflex.  She underwent staged RCA PCI with drug-eluting stent placement. ? ?She has known history of severe hyperlipidemia with intolerance to statins.  ? ?She has chronic kidney disease followed by nephrology and she has been having issues with hyperkalemia. Potassium has been above 6 frequently and she is currently on Lokelma. ? ?Right and left cardiac  catheterization was done in February 2020.  Due to chest pain and shortness of breath. It showed patent stents in the right coronary artery, left circumflex and OM1.  There was moderate in-stent restenosis in the mid left circumflex at the origin of OM 2.  Right heart catheterization showed normal filling pressures, normal pulmonary pressure and normal cardiac output. ? ?She had recent right vitrectomy and currently is recovering from that.  She improved her diet and noticed that her blood pressure has been dropping frequently to systolic below 836.  She feels dizzy and lightheaded.  She started taking carvedilol once daily instead of twice daily.  No chest pain or shortness of breath. ? ? ? ?Past Medical History:  ?Diagnosis Date  ? CAD (coronary artery disease)   ? a. 03/2015 NSTEMI/PCI: LCX 121m(DES), OM1 100p (DES - vessel labeled Ramus in subsequent cath report). Minor irregs to LAD/RCA; b. 04/2015 Cath: LM nl, LAD 40p, RI patent stent, LCX patent stent, RCA nl, EF 55-65%; c. 02/2016 MV: basal inferolateral and mid inferolateral defect/infarct w/o ischemia. EF 55-65%; d. 11/2018 PCI: LM 30ost, LAD 30p, LCX 95p/m (PTCA->20%), OM1 10ost, OM2 40ost, RCA 85p.  ? Chronic abdominal pain   ? Chronic diastolic (congestive) heart failure (HCC)   ? a. 03/2015 Echo: EF 30-35%, mild concentric LVH, severe HK of inf, inflat, & lat walls, mod MR, mild TR;  b. 04/2015 LV Gram: EF 55-65%; c. 09/2017 Echo: EF 55-60%, no rwma. Mild MR; d. 11/2018 Echo: Ef 60-65%, no rwma.  ? CKD (chronic kidney disease), stage III ([place]   ? Diabetes type 2, controlled ([place]   ?  a. since 2002   ? Diabetic gastroparesis ([place])   ? a. chronic nausea/vomiting.  ? Diabetic retinopathy ([place])   ? Diverticulosis, sigmoid 07/2016  ? Fatty liver   ? GERD (gastroesophageal reflux disease)   ? Heavy menses   ? a. H/O IUD - expired in 2014 - remains in place.  ? Hx of migraines   ? Hyperlipidemia   ? Intolerant of atorvastatin, rosuvastatin  ? Hypertension   ?  Iron deficiency anemia   ? Ischemic cardiomyopathy (resolved)   ? a. 03/2015 EF 30-35% post NSTEMI;  b. 04/2015 EF 55-65% on LV gram; c. 09/2017 Echo: EF 55-60%; d. 11/2018 Echo: Ef 60-65%.  ? Obesity   ? Tobacco abuse   ? a. quit 03/2015.  ? Uterine fibroid   ? Vertigo   ? Vitamin D deficiency   ? ? ?Past Surgical History:  ?Procedure Laterality Date  ? APPENDECTOMY    ? CARDIAC CATHETERIZATION  03/2015  ? x2 stent [facility]  ? CARDIAC CATHETERIZATION N/A 05/05/2015  ? Procedure: Left Heart Cath and Coronary Angiography;  Surgeon: Peter M Martinique, MD;  Location: [place] [facility];  Service: Cardiovascular;  Laterality: N/A;  ? CATARACT EXTRACTION Left 09/12/2021  ? CATARACT EXTRACTION Right 10/10/2021  ? COLONOSCOPY WITH PROPOFOL N/A 07/31/2016  ? Procedure: COLONOSCOPY WITH PROPOFOL;  Surgeon: Lollie Sails, MD;  Location: [facility] ENDOSCOPY;  Service: Endoscopy;  Laterality: N/A;  ? CORONARY BALLOON ANGIOPLASTY N/A 12/04/2018  ? Procedure: CORONARY BALLOON ANGIOPLASTY;  Surgeon: Wellington Hampshire, MD;  Location: [place] [facility];  Service: Cardiovascular;  Laterality: N/A;  ? CORONARY STENT INTERVENTION N/A 01/12/2019  ? Procedure: CORONARY STENT INTERVENTION;  Surgeon: [name], MD;  Location: [place] [facility];  Service: Cardiovascular;  Laterality: N/A;  ? ESOPHAGOGASTRODUODENOSCOPY (EGD) WITH PROPOFOL N/A 07/31/2016  ? Procedure: ESOPHAGOGASTRODUODENOSCOPY (EGD) WITH PROPOFOL;  Surgeon: Lollie Sails, MD;  Location: [facility] ENDOSCOPY;  Service: Endoscopy;  Laterality: N/A;  ? INTRAVASCULAR PRESSURE WIRE/FFR STUDY N/A 12/04/2018  ? Procedure: INTRAVASCULAR PRESSURE WIRE/FFR STUDY;  Surgeon: Wellington Hampshire, MD;  Location: [place] [facility];  Service: Cardiovascular;  Laterality: N/A;  ? LAPAROSCOPIC APPENDECTOMY N/A 08/07/2016  ? Procedure: APPENDECTOMY LAPAROSCOPIC;  Surgeon: Hubbard Robinson, MD;  Location: [facility] ORS;  Service: General;  Laterality: N/A;  ? LEFT HEART CATH AND CORONARY  ANGIOGRAPHY Left 12/04/2018  ? Procedure: LEFT HEART CATH AND CORONARY ANGIOGRAPHY;  Surgeon: Wellington Hampshire, MD;  Location: [place] [facility];  Service: Cardiovascular;  Laterality: Left;  ? LEFT HEART CATH AND CORONARY ANGIOGRAPHY N/A 01/12/2019  ? Procedure: LEFT HEART CATH AND CORONARY ANGIOGRAPHY;  Surgeon: [name], MD;  Location: [place] [facility];  Service: Cardiovascular;  Laterality: N/A;  ? MOUTH SURGERY    ? RIGHT/LEFT HEART CATH AND CORONARY ANGIOGRAPHY N/A 01/29/2021  ? Procedure: RIGHT/LEFT HEART CATH AND CORONARY ANGIOGRAPHY;  Surgeon: Wellington Hampshire, MD;  Location: [place] [facility];  Service: Cardiovascular;  Laterality: N/A;  ? VITRECTOMY Left 10/30/2021  ? ? ? ?Current Outpatient Medications  ?Medication Sig Dispense Refill  ? ACCU-CHEK AVIVA PLUS test strip USE 1 STRIP TO CHECK GLUCOSE THREE TIMES DAILY    ? acetaminophen (TYLENOL) 500 MG tablet Take 1,000 mg by mouth every 4 (four) hours as needed.    ? albuterol (VENTOLIN HFA) 108 (90 Base) MCG/ACT inhaler Inhale into the lungs.    ? Alirocumab (PRALUENT) 150 MG/ML SOAJ Inject 1 mL into the skin every 14 (fourteen)  days. 2 mL 11  ? aspirin EC 81 MG tablet Take 81 mg by mouth daily.    ? BD PEN NEEDLE MICRO U/F 32G X 6 MM MISC Inject into the skin.    ? benzonatate (TESSALON) 100 MG capsule Take 1 capsule (100 mg total) by mouth 2 (two) times daily as needed for cough. 30 capsule 12  ? carvedilol (COREG) 25 MG tablet Take 1 tablet (25 mg total) by mouth 2 (two) times daily. 180 tablet 3  ? clopidogrel (PLAVIX) 75 MG tablet Take 1 tablet (75 mg total) by mouth daily. 90 tablet 3  ? Continuous Blood Gluc Receiver (DEXCOM G6 RECEIVER) DEVI 1 PER 5 YEARS    ? Continuous Blood Gluc Sensor (DEXCOM G6 SENSOR) MISC See admin instructions.    ? Continuous Blood Gluc Transmit (DEXCOM G6 TRANSMITTER) MISC Use 1 each every 90 days    ? EQ ALLERGY RELIEF, CETIRIZINE, 10 MG tablet Take 10 mg by mouth daily as needed.    ? etonogestrel  (NEXPLANON) 68 MG IMPL implant 1 each (68 mg total) by Subdermal route once for 1 dose. 1 each 0  ? ezetimibe (ZETIA) 10 MG tablet Take 1 tablet (10 mg total) by mouth daily. 90 tablet 3  ? famotidine (PEPCID) 40 MG

## 2022-04-04 NOTE — Patient Instructions (Signed)
Medication Instructions:  ?Your physician has recommended you make the following change in your medication:  ? ?DECREASE Imdur to 60 mg daily ? ?DECREASE Carvedilol to 12.5 mg twice a day ? ?Your cardiac medications have been refilled today ? ?*If you need a refill on your cardiac medications before your next appointment, please call your pharmacy* ? ? ?Lab Work: ?None ordered ?If you have labs (blood work) drawn today and your tests are completely normal, you will receive your results only by: ?MyChart Message (if you have MyChart) OR ?A paper copy in the mail ?If you have any lab test that is abnormal or we need to change your treatment, we will call you to review the results. ? ? ?Testing/Procedures: ?None ordered ? ? ?Follow-Up: ?At [facility], you and your health needs are our priority.  As part of our continuing mission to provide you with exceptional heart care, we have created designated Provider Care Teams.  These Care Teams include your primary Cardiologist (physician) and Advanced Practice Providers (APPs -  Physician Assistants and Nurse Practitioners) who all work together to provide you with the care you need, when you need it. ? ?We recommend signing up for the patient portal called "MyChart".  Sign up information is provided on this After Visit Summary.  MyChart is used to connect with patients for Virtual Visits (Telemedicine).  Patients are able to view lab/test results, encounter notes, upcoming appointments, etc.  Non-urgent messages can be sent to your provider as well.   ?To learn more about what you can do with MyChart, go to [email or web address].   ? ?Your next appointment:   ?Your physician wants you to follow-up in: 6 months You will receive a reminder letter in the mail two months in advance. If you don't receive a letter, please call our office to schedule the follow-up appointment. ? ? ?The format for your next appointment:   ?In Person ? ?Provider:   ?You may see Kathlyn Sacramento,  MD or one of the following Advanced Practice Providers on your designated Care Team:   ?Murray Hodgkins, NP ?Christell Faith, PA-C ?Cadence Kathlen Mody, PA-C ? ? ? ?Other Instructions ?N/A ? ?Important Information About Sugar ? ? ? ? ? ? ?

## 2022-04-04 NOTE — Telephone Encounter (Signed)
Patient has appointment with Dr. Fletcher Anon today. Please refill if appropriate. Thank you! ?

## 2022-04-05 DIAGNOSIS — H3341 Traction detachment of retina, right eye: Secondary | ICD-10-CM | POA: Diagnosis not present

## 2022-04-05 DIAGNOSIS — Z09 Encounter for follow-up examination after completed treatment for conditions other than malignant neoplasm: Secondary | ICD-10-CM | POA: Diagnosis not present

## 2022-04-08 DIAGNOSIS — Z794 Long term (current) use of insulin: Secondary | ICD-10-CM | POA: Diagnosis not present

## 2022-04-08 DIAGNOSIS — E1159 Type 2 diabetes mellitus with other circulatory complications: Secondary | ICD-10-CM | POA: Diagnosis not present

## 2022-04-08 DIAGNOSIS — E1169 Type 2 diabetes mellitus with other specified complication: Secondary | ICD-10-CM | POA: Diagnosis not present

## 2022-04-08 DIAGNOSIS — I10 Essential (primary) hypertension: Secondary | ICD-10-CM | POA: Diagnosis not present

## 2022-04-08 DIAGNOSIS — J301 Allergic rhinitis due to pollen: Secondary | ICD-10-CM | POA: Diagnosis not present

## 2022-04-08 DIAGNOSIS — E669 Obesity, unspecified: Secondary | ICD-10-CM | POA: Diagnosis not present

## 2022-04-08 DIAGNOSIS — E11311 Type 2 diabetes mellitus with unspecified diabetic retinopathy with macular edema: Secondary | ICD-10-CM | POA: Diagnosis not present

## 2022-04-08 DIAGNOSIS — N184 Chronic kidney disease, stage 4 (severe): Secondary | ICD-10-CM | POA: Diagnosis not present

## 2022-04-08 DIAGNOSIS — E1122 Type 2 diabetes mellitus with diabetic chronic kidney disease: Secondary | ICD-10-CM | POA: Diagnosis not present

## 2022-04-08 DIAGNOSIS — E1143 Type 2 diabetes mellitus with diabetic autonomic (poly)neuropathy: Secondary | ICD-10-CM | POA: Diagnosis not present

## 2022-04-10 NOTE — Addendum Note (Signed)
Addended by: Britt Bottom on: 04/10/2022 09:20 AM ? ? Modules accepted: Orders ? ?

## 2022-04-11 DIAGNOSIS — J301 Allergic rhinitis due to pollen: Secondary | ICD-10-CM | POA: Diagnosis not present

## 2022-04-15 DIAGNOSIS — J301 Allergic rhinitis due to pollen: Secondary | ICD-10-CM | POA: Diagnosis not present

## 2022-04-17 DIAGNOSIS — J301 Allergic rhinitis due to pollen: Secondary | ICD-10-CM | POA: Diagnosis not present

## 2022-04-18 DIAGNOSIS — J301 Allergic rhinitis due to pollen: Secondary | ICD-10-CM | POA: Diagnosis not present

## 2022-04-22 DIAGNOSIS — J301 Allergic rhinitis due to pollen: Secondary | ICD-10-CM | POA: Diagnosis not present

## 2022-04-25 DIAGNOSIS — J301 Allergic rhinitis due to pollen: Secondary | ICD-10-CM | POA: Diagnosis not present

## 2022-04-29 DIAGNOSIS — J301 Allergic rhinitis due to pollen: Secondary | ICD-10-CM | POA: Diagnosis not present

## 2022-05-02 DIAGNOSIS — J301 Allergic rhinitis due to pollen: Secondary | ICD-10-CM | POA: Diagnosis not present

## 2022-05-08 ENCOUNTER — Telehealth (INDEPENDENT_AMBULATORY_CARE_PROVIDER_SITE_OTHER): Payer: Medicare Other | Admitting: Nurse Practitioner

## 2022-05-08 ENCOUNTER — Encounter: Payer: Self-pay | Admitting: Nurse Practitioner

## 2022-05-08 DIAGNOSIS — M79605 Pain in left leg: Secondary | ICD-10-CM | POA: Insufficient documentation

## 2022-05-08 NOTE — Assessment & Plan Note (Signed)
Discussed at length with patient -- major concern for DVT in this higher risk patient.  She is currently on road to [facility] -- had advised her to immediately go to ER setting there to be assessed and have imaging done.  She is to reach out to provider tomorrow via MyChart with update on ER visit.  Discussed at length the importance of ER visit immediately for this, she is agreeable to plan and will go to ER in [place] area.

## 2022-05-08 NOTE — Progress Notes (Signed)
There were no vitals taken for this visit.   Subjective:    Patient ID: [name] [name], female    DOB: June 15, 1981, 41 y.o.   MRN: [id number]  HPI: [name] [name] is a 41 y.o. female  Chief Complaint  Patient presents with   Leg Pain    Pt states she started having pain in her L lower leg on Saturday. States she feels like she has a cramp in her leg. States she can feel a knot and it painful to the touch and to walk.    This visit was completed via video visit through MyChart due to the restrictions of the COVID-19 pandemic. All issues as above were discussed and addressed. Physical exam was done as above through visual confirmation on video through MyChart. If it was felt that the patient should be evaluated in the office, they were directed there. The patient verbally consented to this visit. Location of the patient: home Location of the provider: home Those involved with this call:  Provider: Marnee Guarneri, DNP CMA: Irena Reichmann, [place] Desk/Registration: [employer]  Time spent on call:  21 minutes with patient face to face via video conference. More than 50% of this time was spent in counseling and coordination of care. 15 minutes total spent in review of patient's record and preparation of their chart.  I verified patient identity using two factors (patient name and date of birth). Patient consents verbally to being seen via telemedicine visit today.    LEG PAIN (LEFT LEG) Ongoing since Saturday -- has muscle spasms in legs at baseline, frequent at one time and then they slacked off -- now have started again.  Painful for her to walk on left foot.  She felt like there was a knot there -- painful to touch.  Pain was 10/10 at first, but start to ease up now.  She is currently on route to [facility] to see family.  Denies edema, SOB, CP. Duration: days Pain: yes Severity: 5/10  Quality:  sharp, aching, and throbbing Location:  left lower leg Bilateral:  no Onset:  sudden Frequency: intermittent Paresthesias:   no Decreased sensation:  no Weakness:   no Insomnia:   no Alleviating factors: not walking on it Aggravating factors: walking on it and movement Status: stable Treatments attempted: nothing  Relevant past medical, surgical, family and social history reviewed and updated as indicated. Interim medical history since our last visit reviewed. Allergies and medications reviewed and updated.  Review of Systems  Constitutional:  Negative for activity change, appetite change, diaphoresis, fatigue and fever.  Respiratory:  Negative for cough, chest tightness and shortness of breath.   Cardiovascular:  Negative for chest pain, palpitations and leg swelling.  Musculoskeletal:  Positive for arthralgias.  Neurological: Negative.   Psychiatric/Behavioral: Negative.     Per HPI unless specifically indicated above     Objective:    There were no vitals taken for this visit.  Wt Readings from Last 3 Encounters:  04/04/22 268 lb 8 oz (121.8 kg)  02/20/22 269 lb 6.4 oz (122.2 kg)  01/23/22 261 lb 12.8 oz (118.8 kg)    Physical Exam Vitals and nursing note reviewed.  Constitutional:      General: She is awake. She is not in acute distress.    Appearance: She is well-developed. She is not ill-appearing.  HENT:     Head: Normocephalic.     Right Ear: Hearing normal.     Left Ear: Hearing normal.  Eyes:  General: Lids are normal.        Right eye: No discharge.        Left eye: No discharge.     Conjunctiva/sclera: Conjunctivae normal.  Pulmonary:     Effort: Pulmonary effort is normal. No accessory muscle usage or respiratory distress.  Musculoskeletal:     Cervical back: Normal range of motion.     Comments: Unable to view legs as is passenger in car.  She does report if she flexes left foot upwards she does feel pain in leg.  Neurological:     Mental Status: She is alert and oriented to person, place, and time.  Psychiatric:         Attention and Perception: Attention normal.        Mood and Affect: Mood normal.        Behavior: Behavior normal. Behavior is cooperative.        Thought Content: Thought content normal.        Judgment: Judgment normal.    Results for orders placed or performed in visit on 01/23/22  Microscopic Examination  Result Value Ref Range   WBC, UA 0-5 0 - 5 /hpf   RBC 0-2 0 - 2 /hpf   Epithelial Cells (non renal) 0-10 0 - 10 /hpf   Casts Present (A) None seen /lpf   Cast Type Hyaline casts N/A   Mucus, UA Present (A) Not Estab.   Bacteria, UA Moderate (A) None seen/Few  WET PREP FOR TRICH, YEAST, CLUE   Urine  Result Value Ref Range   Trichomonas Exam Negative Negative   Yeast Exam Negative Negative   Clue Cell Exam Negative Negative  Urinalysis, Routine w reflex microscopic  Result Value Ref Range   Specific Gravity, UA 1.015 1.005 - 1.030   pH, UA 5.5 5.0 - 7.5   Color, UA Yellow Yellow   Appearance Ur Cloudy (A) Clear   Leukocytes,UA Trace (A) Negative   Protein,UA 2+ (A) Negative/Trace   Glucose, UA Negative Negative   Ketones, UA Negative Negative   RBC, UA Trace (A) Negative   Bilirubin, UA Negative Negative   Urobilinogen, Ur 0.2 0.2 - 1.0 mg/dL   Nitrite, UA Negative Negative   Microscopic Examination See below:       Assessment & Plan:   Problem List Items Addressed This Visit       Other   Left leg pain - Primary    Discussed at length with patient -- major concern for DVT in this higher risk patient.  She is currently on road to [facility] -- had advised her to immediately go to ER setting there to be assessed and have imaging done.  She is to reach out to provider tomorrow via MyChart with update on ER visit.  Discussed at length the importance of ER visit immediately for this, she is agreeable to plan and will go to ER in [place] area.        I discussed the assessment and treatment plan with the patient. The patient was provided an opportunity to ask questions  and all were answered. The patient agreed with the plan and demonstrated an understanding of the instructions.   The patient was advised to call back or seek an in-person evaluation if the symptoms worsen or if the condition fails to improve as anticipated.   I provided 21+ minutes of time during this encounter.   Follow up plan: Return in about 1 week (around 05/15/2022) for in one week  for leg pain.

## 2022-05-08 NOTE — Patient Instructions (Signed)
Deep Vein Thrombosis  Deep vein thrombosis (DVT) is a condition in which a blood clot forms in a vein of the deep venous system. This can occur in the lower leg, thigh, pelvis, arm, or neck. A clot is blood that has thickened into a gel or solid. This condition is serious and can be life-threatening if the clot travels to the arteries of the lungs and causes a blockage (pulmonary embolism). A DVT can also damage veins in the leg, which can lead to long-term venous disease, leg pain, swelling, discoloration, and ulcers or sores (post-thrombotic syndrome). What are the causes? This condition may be caused by: A slowdown of blood flow. Damage to a vein. A condition that causes blood to clot more easily, such as certain bleeding disorders. What increases the risk? The following factors may make you more likely to develop this condition: Obesity. Being older, especially older than age 42. Being inactive or not moving around (sedentary lifestyle). This may include: Sitting or lying down for longer than 4-6 hours other than to sleep at night. Being in the hospital, or having major or lengthy surgery. Having any recent bone injuries, such as breaks (fractures), that reduce movement, especially in the lower extremities. Having recent orthopedic surgery on the lower extremities. Being pregnant, giving birth, or having recently given birth. Taking medicines that contain estrogen, such as birth control or hormone replacement therapy. Using products that contain nicotine or tobacco, especially if you use hormonal birth control. Having a history of a blood vessel disease (peripheral vascular disease) or congestive heart disease. Having a history of cancer, especially if being treated with chemotherapy. What are the signs or symptoms? Symptoms of this condition include: Swelling, pain, pressure, or tenderness in an arm or a leg. An arm or a leg becoming warm, red, or discolored. A leg turning very pale or  blue. You may have a large DVT. This is rare. If the clot is in your leg, you may notice that symptoms get worse when you stand or walk. In some cases, there are no symptoms. How is this diagnosed? This condition is diagnosed with: Your medical history and a physical exam. Tests, such as: Blood tests to check how well your blood clots. Doppler ultrasound. This is the best way to find a DVT. CT venogram. Contrast dye is injected into a vein, and X-rays are taken to check for clots. This is helpful for veins in the chest or pelvis. How is this treated? Treatment for this condition depends on: The cause of your DVT. The size and location of your DVT, or having more than one DVT. Your risk for bleeding or developing more clots. Other medical conditions you may have. Treatment may include: Taking a blood thinner medicine (anticoagulant) to prevent more clots from forming or current clots from growing. Wearing compression stockings. Injecting medicines into the affected vein to break up the clot (catheter-directed thrombolysis). Surgical procedures, when DVT is severe or hard to treat. These may be done to: Isolate and remove your clot. Place an inferior vena cava (IVC) filter. This filter is placed into a large vein called the inferior vena cava to catch blood clots before they reach your lungs. You may get some medical treatments for 6 months or longer. Follow these instructions at home: If you are taking blood thinners: Talk with your health care provider before you take any medicines that contain aspirin or NSAIDs, such as ibuprofen. These medicines increase your risk for dangerous bleeding. Take your medicine exactly  as told, at the same time every day. Do not skip a dose. Do not take more than the prescribed dose. This is important. Ask your health care provider about foods and medicines that could change or interact with the way your blood thinner works. Avoid these foods and medicines  if you are told to do so. Avoid anything that may cause bleeding or bruising. You may bleed more easily while taking blood thinners. Be very careful when using knives, scissors, or other sharp objects. Use an electric razor instead of a blade. Avoid activities that could cause injury or bruising, and follow instructions for preventing falls. Tell your health care provider if you have had any internal bleeding, bleeding ulcers, or neurologic diseases, such as strokes or cerebral aneurysms. Wear a medical alert bracelet or carry a card that lists what medicines you take. General instructions Take over-the-counter and prescription medicines only as told by your health care provider. Return to your normal activities as told by your health care provider. Ask your health care provider what activities are safe for you. If recommended, wear compression stockings as told by your health care provider. These stockings help to prevent blood clots and reduce swelling in your legs. Never wear your compression stockings while sleeping at night. Keep all follow-up visits. This is important. Where to find more information American Heart Association: www.heart.org Centers for Disease Control and Prevention: http://www.wolf.info/ National Heart, Lung, and Blood Institute: https://wilson-eaton.com/ Contact a health care provider if: You miss a dose of your blood thinner. You have unusual bruising or other color changes. You have new or worse pain, swelling, or redness in an arm or a leg. You have worsening numbness or tingling in an arm or a leg. You have a significant color change (pale or blue) in the extremity that has the DVT. Get help right away if: You have signs or symptoms that a blood clot has moved to the lungs. These may include: Shortness of breath. Chest pain. Fast or irregular heartbeats (palpitations). Light-headedness, dizziness, or fainting. Coughing up blood. You have signs or symptoms that your blood is  too thin. These may include: Blood in your vomit, stool, or urine. A cut that will not stop bleeding. A menstrual period that is heavier than usual. A severe headache or confusion. These symptoms may be an emergency. Get help right away. Call 911. Do not wait to see if the symptoms will go away. Do not drive yourself to the hospital. Summary Deep vein thrombosis (DVT) happens when a blood clot forms in a deep vein. This may occur in the lower leg, thigh, pelvis, arm, or neck. Symptoms affect the arm or leg and can include swelling, pain, tenderness, warmth, redness, or discoloration. This condition may be treated with medicines. In severe cases, a procedure or surgery may be done to remove or dissolve the clots. If you are taking blood thinners, take them exactly as told. Do not skip a dose. Do not take more than is prescribed. Get help right away if you have a severe headache, shortness of breath, chest pain, fast or irregular heartbeats, or blood in your vomit, urine, or stool. This information is not intended to replace advice given to you by your health care provider. Make sure you discuss any questions you have with your health care provider. Document Revised: 06/18/2021 Document Reviewed: 06/18/2021 Elsevier Patient Education  [place].

## 2022-05-09 DIAGNOSIS — I252 Old myocardial infarction: Secondary | ICD-10-CM | POA: Diagnosis not present

## 2022-05-09 DIAGNOSIS — S86912A Strain of unspecified muscle(s) and tendon(s) at lower leg level, left leg, initial encounter: Secondary | ICD-10-CM | POA: Diagnosis not present

## 2022-05-09 DIAGNOSIS — Z955 Presence of coronary angioplasty implant and graft: Secondary | ICD-10-CM | POA: Diagnosis not present

## 2022-05-09 DIAGNOSIS — Z7902 Long term (current) use of antithrombotics/antiplatelets: Secondary | ICD-10-CM | POA: Diagnosis not present

## 2022-05-09 DIAGNOSIS — M79662 Pain in left lower leg: Secondary | ICD-10-CM | POA: Diagnosis not present

## 2022-05-09 DIAGNOSIS — Z7982 Long term (current) use of aspirin: Secondary | ICD-10-CM | POA: Diagnosis not present

## 2022-05-09 DIAGNOSIS — T148XXA Other injury of unspecified body region, initial encounter: Secondary | ICD-10-CM | POA: Diagnosis not present

## 2022-05-13 DIAGNOSIS — J301 Allergic rhinitis due to pollen: Secondary | ICD-10-CM | POA: Diagnosis not present

## 2022-05-15 ENCOUNTER — Ambulatory Visit: Payer: Medicare Other | Admitting: Nurse Practitioner

## 2022-05-15 ENCOUNTER — Other Ambulatory Visit: Payer: Self-pay | Admitting: Cardiovascular Disease

## 2022-05-16 DIAGNOSIS — J301 Allergic rhinitis due to pollen: Secondary | ICD-10-CM | POA: Diagnosis not present

## 2022-05-17 DIAGNOSIS — Z961 Presence of intraocular lens: Secondary | ICD-10-CM | POA: Diagnosis not present

## 2022-05-17 DIAGNOSIS — E113511 Type 2 diabetes mellitus with proliferative diabetic retinopathy with macular edema, right eye: Secondary | ICD-10-CM | POA: Diagnosis not present

## 2022-05-17 DIAGNOSIS — H40053 Ocular hypertension, bilateral: Secondary | ICD-10-CM | POA: Diagnosis not present

## 2022-05-18 NOTE — Patient Instructions (Incomplete)
Obtain Voltaren gel over the counter  Diabetes Mellitus and Nutrition, Adult When you have diabetes, or diabetes mellitus, it is very important to have healthy eating habits because your blood sugar (glucose) levels are greatly affected by what you eat and drink. Eating healthy foods in the right amounts, at about the same times every day, can help you: Manage your blood glucose. Lower your risk of heart disease. Improve your blood pressure. Reach or maintain a healthy weight. What can affect my meal plan? Every person with diabetes is different, and each person has different needs for a meal plan. Your health care provider may recommend that you work with a dietitian to make a meal plan that is best for you. Your meal plan may vary depending on factors such as: The calories you need. The medicines you take. Your weight. Your blood glucose, blood pressure, and cholesterol levels. Your activity level. Other health conditions you have, such as heart or kidney disease. How do carbohydrates affect me? Carbohydrates, also called carbs, affect your blood glucose level more than any other type of food. Eating carbs raises the amount of glucose in your blood. It is important to know how many carbs you can safely have in each meal. This is different for every person. Your dietitian can help you calculate how many carbs you should have at each meal and for each snack. How does alcohol affect me? Alcohol can cause a decrease in blood glucose (hypoglycemia), especially if you use insulin or take certain diabetes medicines by mouth. Hypoglycemia can be a life-threatening condition. Symptoms of hypoglycemia, such as sleepiness, dizziness, and confusion, are similar to symptoms of having too much alcohol. Do not drink alcohol if: Your health care provider tells you not to drink. You are pregnant, may be pregnant, or are planning to become pregnant. If you drink alcohol: Limit how much you have to: 0-1  drink a day for women. 0-2 drinks a day for men. Know how much alcohol is in your drink. In the [place] one drink equals one 12 oz bottle of beer (355 mL), one 5 oz glass of wine (148 mL), or one 1 oz glass of hard liquor (44 mL). Keep yourself hydrated with water, diet soda, or unsweetened iced tea. Keep in mind that regular soda, juice, and other mixers may contain a lot of sugar and must be counted as carbs. What are tips for following this plan?  Reading food labels Start by checking the serving size on the Nutrition Facts label of packaged foods and drinks. The number of calories and the amount of carbs, fats, and other nutrients listed on the label are based on one serving of the item. Many items contain more than one serving per package. Check the total grams (g) of carbs in one serving. Check the number of grams of saturated fats and trans fats in one serving. Choose foods that have a low amount or none of these fats. Check the number of milligrams (mg) of salt (sodium) in one serving. Most people should limit total sodium intake to less than 2,300 mg per day. Always check the nutrition information of foods labeled as "low-fat" or "nonfat." These foods may be higher in added sugar or refined carbs and should be avoided. Talk to your dietitian to identify your daily goals for nutrients listed on the label. Shopping Avoid buying canned, pre-made, or processed foods. These foods tend to be high in fat, sodium, and added sugar. Shop around the outside edge of  the grocery store. This is where you will most often find fresh fruits and vegetables, bulk grains, fresh meats, and fresh dairy products. Cooking Use low-heat cooking methods, such as baking, instead of high-heat cooking methods, such as deep frying. Cook using healthy oils, such as olive, canola, or sunflower oil. Avoid cooking with butter, cream, or high-fat meats. Meal planning Eat meals and snacks regularly, preferably at the same  times every day. Avoid going long periods of time without eating. Eat foods that are high in fiber, such as fresh fruits, vegetables, beans, and whole grains. Eat 4-6 oz (112-168 g) of lean protein each day, such as lean meat, chicken, fish, eggs, or tofu. One ounce (oz) (28 g) of lean protein is equal to: 1 oz (28 g) of meat, chicken, or fish. 1 egg.  cup (62 g) of tofu. Eat some foods each day that contain healthy fats, such as avocado, nuts, seeds, and fish. What foods should I eat? Fruits Berries. Apples. Oranges. Peaches. Apricots. Plums. Grapes. Mangoes. Papayas. Pomegranates. Kiwi. Cherries. Vegetables Leafy greens, including lettuce, spinach, kale, chard, collard greens, mustard greens, and cabbage. Beets. Cauliflower. Broccoli. Carrots. Green beans. Tomatoes. Peppers. Onions. Cucumbers. Brussels sprouts. Grains Whole grains, such as whole-wheat or whole-grain bread, crackers, tortillas, cereal, and pasta. Unsweetened oatmeal. Quinoa. Brown or wild rice. Meats and other proteins Seafood. Poultry without skin. Lean cuts of poultry and beef. Tofu. Nuts. Seeds. Dairy Low-fat or fat-free dairy products such as milk, yogurt, and cheese. The items listed above may not be a complete list of foods and beverages you can eat and drink. Contact a dietitian for more information. What foods should I avoid? Fruits Fruits canned with syrup. Vegetables Canned vegetables. Frozen vegetables with butter or cream sauce. Grains Refined white flour and flour products such as bread, pasta, snack foods, and cereals. Avoid all processed foods. Meats and other proteins Fatty cuts of meat. Poultry with skin. Breaded or fried meats. Processed meat. Avoid saturated fats. Dairy Full-fat yogurt, cheese, or milk. Beverages Sweetened drinks, such as soda or iced tea. The items listed above may not be a complete list of foods and beverages you should avoid. Contact a dietitian for more information. Questions  to ask a health care provider Do I need to meet with a certified diabetes care and education specialist? Do I need to meet with a dietitian? What number can I call if I have questions? When are the best times to check my blood glucose? Where to find more information: American Diabetes Association: diabetes.org Academy of Nutrition and Dietetics: eatright.[employer] of Diabetes and Digestive and Kidney Diseases: [employer]is Association of Diabetes Care & Education Specialists: diabeteseducator.org Summary It is important to have healthy eating habits because your blood sugar (glucose) levels are greatly affected by what you eat and drink. It is important to use alcohol carefully. A healthy meal plan will help you manage your blood glucose and lower your risk of heart disease. Your health care provider may recommend that you work with a dietitian to make a meal plan that is best for you. This information is not intended to replace advice given to you by your health care provider. Make sure you discuss any questions you have with your health care provider. Document Revised: 06/28/2020 Document Reviewed: 06/28/2020 Elsevier Patient Education  [place].

## 2022-05-20 DIAGNOSIS — J301 Allergic rhinitis due to pollen: Secondary | ICD-10-CM | POA: Diagnosis not present

## 2022-05-21 ENCOUNTER — Encounter: Payer: Self-pay | Admitting: Nurse Practitioner

## 2022-05-21 ENCOUNTER — Ambulatory Visit (INDEPENDENT_AMBULATORY_CARE_PROVIDER_SITE_OTHER): Payer: Medicare Other | Admitting: Nurse Practitioner

## 2022-05-21 VITALS — BP 135/71 | HR 76 | Temp 98.1°F | Ht 65.0 in | Wt 276.4 lb

## 2022-05-21 DIAGNOSIS — N2581 Secondary hyperparathyroidism of renal origin: Secondary | ICD-10-CM

## 2022-05-21 DIAGNOSIS — E1129 Type 2 diabetes mellitus with other diabetic kidney complication: Secondary | ICD-10-CM | POA: Diagnosis not present

## 2022-05-21 DIAGNOSIS — N183 Chronic kidney disease, stage 3 unspecified: Secondary | ICD-10-CM

## 2022-05-21 DIAGNOSIS — E785 Hyperlipidemia, unspecified: Secondary | ICD-10-CM | POA: Diagnosis not present

## 2022-05-21 DIAGNOSIS — I130 Hypertensive heart and chronic kidney disease with heart failure and stage 1 through stage 4 chronic kidney disease, or unspecified chronic kidney disease: Secondary | ICD-10-CM | POA: Diagnosis not present

## 2022-05-21 DIAGNOSIS — J309 Allergic rhinitis, unspecified: Secondary | ICD-10-CM | POA: Diagnosis not present

## 2022-05-21 DIAGNOSIS — J320 Chronic maxillary sinusitis: Secondary | ICD-10-CM | POA: Diagnosis not present

## 2022-05-21 DIAGNOSIS — R809 Proteinuria, unspecified: Secondary | ICD-10-CM

## 2022-05-21 DIAGNOSIS — I700 Atherosclerosis of aorta: Secondary | ICD-10-CM

## 2022-05-21 DIAGNOSIS — E1143 Type 2 diabetes mellitus with diabetic autonomic (poly)neuropathy: Secondary | ICD-10-CM | POA: Diagnosis not present

## 2022-05-21 DIAGNOSIS — I5032 Chronic diastolic (congestive) heart failure: Secondary | ICD-10-CM

## 2022-05-21 DIAGNOSIS — Z6841 Body Mass Index (BMI) 40.0 and over, adult: Secondary | ICD-10-CM

## 2022-05-21 DIAGNOSIS — Z794 Long term (current) use of insulin: Secondary | ICD-10-CM

## 2022-05-21 DIAGNOSIS — M25572 Pain in left ankle and joints of left foot: Secondary | ICD-10-CM | POA: Diagnosis not present

## 2022-05-21 DIAGNOSIS — D631 Anemia in chronic kidney disease: Secondary | ICD-10-CM

## 2022-05-21 DIAGNOSIS — N184 Chronic kidney disease, stage 4 (severe): Secondary | ICD-10-CM

## 2022-05-21 DIAGNOSIS — I25118 Atherosclerotic heart disease of native coronary artery with other forms of angina pectoris: Secondary | ICD-10-CM | POA: Diagnosis not present

## 2022-05-21 DIAGNOSIS — K3184 Gastroparesis: Secondary | ICD-10-CM

## 2022-05-21 DIAGNOSIS — M109 Gout, unspecified: Secondary | ICD-10-CM

## 2022-05-21 DIAGNOSIS — E1169 Type 2 diabetes mellitus with other specified complication: Secondary | ICD-10-CM | POA: Diagnosis not present

## 2022-05-21 DIAGNOSIS — G4733 Obstructive sleep apnea (adult) (pediatric): Secondary | ICD-10-CM

## 2022-05-21 DIAGNOSIS — E1122 Type 2 diabetes mellitus with diabetic chronic kidney disease: Secondary | ICD-10-CM | POA: Diagnosis not present

## 2022-05-21 MED ORDER — ACCU-CHEK AVIVA PLUS VI STRP
ORAL_STRIP | 12 refills | Status: DC
Start: 2022-05-21 — End: 2022-05-24

## 2022-05-21 NOTE — Assessment & Plan Note (Signed)
Chronic, ongoing, followed by nephrology.  Recent notes and labs reviewed.  Continue current medications as prescribed by them.  BMP today, urine ALB 150 on check recent with endo.  Continue focus on vegan style diet.

## 2022-05-21 NOTE — Assessment & Plan Note (Signed)
BMI 46.00 with T2DM, HF, CKD.  Recommended eating smaller high protein, low fat meals more frequently and exercising 30 mins a day 5 times a week with a goal of 10-15lb weight loss in the next 3 months. Patient voiced their understanding and motivation to adhere to these recommendations.

## 2022-05-21 NOTE — Assessment & Plan Note (Signed)
Chronic, ongoing, followed by nephrology.  Continue this collaboration - recent note and labs reviewed. 

## 2022-05-21 NOTE — Progress Notes (Signed)
BP 135/71   Pulse 76   Temp 98.1 F (36.7 C) (Oral)   Ht '5\' 5"'$  (1.651 m)   Wt 276 lb 6.4 oz (125.4 kg)   BMI 46.00 kg/m    Subjective:    Patient ID: [name] [name], female    DOB: Nov 28, 1981, 41 y.o.   MRN: [id number]  HPI: [name] [name] is a 41 y.o. female  Chief Complaint  Patient presents with   Diabetes   Congestive Heart Failure   Chronic Kidney Disease    Patient states she would like to have her kidney function checked today's visit.    Hypertension   Hyperlipidemia   Joint Swelling    Patient states she is still experiencing ankle swelling and pain. Patient states she is currently taking Lasix. Patient states she went to the ER as advised and was informed it was a strain muscle and it has not gotten any better.    DIABETES WITH GASTROPARESIS Followed by endocrinology, last visit 04/08/22 with A1c 7.6%.  Continues on [employer].  Previously was being followed by GI, reports less issues with stomach now -- she is no longer eating meat, with exception of fish, and had gone to more vegan diet.  She is using Dexcom, but reports this is weird at time and not always reading correctly. Hypoglycemic episodes:no Polydipsia/polyuria: no Visual disturbance: no Chest pain: no Paresthesias: no Glucose Monitoring: yes  Accucheck frequency: BID  Fasting glucose: 94 at present via Dexacom, average 131 over past 2 days  Post prandial:  Evening: varies  Before meals: Taking Insulin?: yes  Long acting insulin: 80 Basaglar  Short acting insulin: sliding scale Blood Pressure Monitoring: daily Retinal Examination: Up to Date Foot Exam: Up to Date Pneumovax: Up to Date Influenza: Up to Date Aspirin: yes   HYPERTENSION / HYPERLIPIDEMIA/HF/AORTIC ATHEROSCLEROSIS Followed by cardiology and last seen 04/04/22 -- is to be taking Lokelma but often forgets doses on review of notes, they decreased Imdur and Carvedilol doses. Is statin intolerant and takes Praluent.   Satisfied  with current treatment? yes Duration of hypertension: chronic BP monitoring frequency: daily BP range: 110-130/80's BP medication side effects: no Duration of hyperlipidemia: chronic Cholesterol supplements: none Medication compliance: good compliance Aspirin: yes Recent stressors: no Recurrent headaches: no Visual changes: no Palpitations: no Dyspnea: no Chest pain: no Lower extremity edema: none Dizzy/lightheaded: none  CHRONIC KIDNEY DISEASE Is followed by nephrology for CKD and last saw 04/02/22 with recent labs 04/08/22 CRT 2.0, eGFR 33, PTH 77, K+ 5.7.  Forgets Lokelma doses on occasion, she does endorse this. CKD status: stable Medications renally dose: yes Previous renal evaluation: yes Pneumovax:  Up to Date Influenza Vaccine:  Up to Date   ANKLE PAIN (LEFT) Is continuing to have muscle type pain to left lower leg, which started after a muscle spasm.  Was seen in ER to ensure no DVT, seen on 05/09/22 with imaging noting no DVT.  They diagnosed with muscle strain.  She continues to have discomfort and swelling, is taking Lasix daily.  None to the right lower leg noted.    Feels like ankle wants to give out on occasion.  No recent falls or fractures.  Has been taking Tylenol for this without benefit.  Has soaked in Epsom salt, iced it, elevated it, wore ankle support -- nothing is helping.   Duration: days Pain: yes Severity: 4/10 -- if walks on it becomes more painful Quality:  sharp and throbbing Location:  left lower leg  Bilateral:  no Onset: sudden Frequency: constant Paresthesias:   no Decreased sensation:  no Weakness:    sometimes Insomnia:   no Fatigue:   no Alleviating factors: nothing Aggravating factors: walking on it  Status: fluctuating Treatments attempted: Tylenol and as above  Relevant past medical, surgical, family and social history reviewed and updated as indicated. Interim medical history since our last visit reviewed. Allergies and medications  reviewed and updated.  Review of Systems  Constitutional:  Negative for activity change, appetite change, diaphoresis, fatigue and fever.  Respiratory:  Negative for cough, chest tightness and shortness of breath.   Cardiovascular:  Negative for chest pain, palpitations and leg swelling.  Gastrointestinal:  Negative for abdominal distention, abdominal pain, constipation, diarrhea, nausea and vomiting.  Endocrine: Negative for cold intolerance, heat intolerance, polydipsia, polyphagia and polyuria.  Neurological:  Negative for dizziness, syncope, weakness, light-headedness, numbness and headaches.  Psychiatric/Behavioral: Negative.      Per HPI unless specifically indicated above     Objective:    BP 135/71   Pulse 76   Temp 98.1 F (36.7 C) (Oral)   Ht $R'5\' 5"'bJ$  (1.651 m)   Wt 276 lb 6.4 oz (125.4 kg)   BMI 46.00 kg/m   Wt Readings from Last 3 Encounters:  05/21/22 276 lb 6.4 oz (125.4 kg)  04/04/22 268 lb 8 oz (121.8 kg)  02/20/22 269 lb 6.4 oz (122.2 kg)    Physical Exam Vitals and nursing note reviewed.  Constitutional:      General: She is awake. She is not in acute distress.    Appearance: She is well-developed and well-groomed. She is obese. She is not ill-appearing or toxic-appearing.  HENT:     Head: Normocephalic.     Right Ear: Hearing normal.     Left Ear: Hearing normal.  Eyes:     General: Lids are normal.        Right eye: No discharge.        Left eye: No discharge.     Conjunctiva/sclera: Conjunctivae normal.     Pupils: Pupils are equal, round, and reactive to light.  Neck:     Thyroid: No thyromegaly.     Vascular: No carotid bruit.  Cardiovascular:     Rate and Rhythm: Normal rate and regular rhythm.     Heart sounds: Normal heart sounds. No murmur heard.    No gallop.  Pulmonary:     Effort: Pulmonary effort is normal. No accessory muscle usage or respiratory distress.     Breath sounds: Normal breath sounds.  Abdominal:     General: Bowel  sounds are normal.     Palpations: Abdomen is soft. There is no hepatomegaly or splenomegaly.     Tenderness: There is no abdominal tenderness.  Musculoskeletal:     Cervical back: Normal range of motion and neck supple.     Right lower leg: No edema.     Left lower leg: No edema.     Right ankle: Normal.     Right Achilles Tendon: No tenderness. Thompson's test negative.     Left ankle: Swelling present. Tenderness present over the medial malleolus. No lateral malleolus tenderness. Normal range of motion. Anterior drawer test negative. Normal pulse.     Left Achilles Tendon: No tenderness. Thompson's test negative.  Skin:    General: Skin is warm and dry.  Neurological:     Mental Status: She is alert and oriented to person, place, and time.  Psychiatric:  Attention and Perception: Attention normal.        Mood and Affect: Mood normal.        Behavior: Behavior normal. Behavior is cooperative.        Thought Content: Thought content normal.        Judgment: Judgment normal.     Results for orders placed or performed in visit on 01/23/22  Microscopic Examination  Result Value Ref Range   WBC, UA 0-5 0 - 5 /hpf   RBC 0-2 0 - 2 /hpf   Epithelial Cells (non renal) 0-10 0 - 10 /hpf   Casts Present (A) None seen /lpf   Cast Type Hyaline casts N/A   Mucus, UA Present (A) Not Estab.   Bacteria, UA Moderate (A) None seen/Few  WET PREP FOR TRICH, YEAST, CLUE   Urine  Result Value Ref Range   Trichomonas Exam Negative Negative   Yeast Exam Negative Negative   Clue Cell Exam Negative Negative  Urinalysis, Routine w reflex microscopic  Result Value Ref Range   Specific Gravity, UA 1.015 1.005 - 1.030   pH, UA 5.5 5.0 - 7.5   Color, UA Yellow Yellow   Appearance Ur Cloudy (A) Clear   Leukocytes,UA Trace (A) Negative   Protein,UA 2+ (A) Negative/Trace   Glucose, UA Negative Negative   Ketones, UA Negative Negative   RBC, UA Trace (A) Negative   Bilirubin, UA Negative  Negative   Urobilinogen, Ur 0.2 0.2 - 1.0 mg/dL   Nitrite, UA Negative Negative   Microscopic Examination See below:       Assessment & Plan:   Problem List Items Addressed This Visit       Cardiovascular and Mediastinum   Chronic diastolic heart failure (HCC) (Chronic)    Chronic, ongoing, followed by cardiology.  Euvolemic today.  Continue current medication regimen as prescribed by them.  Recommend: - Reminded to call for an overnight weight gain of >2 pounds or a weekly weight gain of >5 pounds - not adding salt to food and read food labels. Reviewed the importance of keeping daily sodium intake to '2000mg'$  daily - Avoid all NSAID products      Atherosclerosis of aorta ([place])    Noted on past imaging.  Continue Plavix and Praluent for prevention.      Coronary artery disease of native artery of native heart with stable angina pectoris (HCC)    Chronic, stable, followed by cardiology.  Continue this collaboration and medication regimen as prescribed by them.      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  BP at goal today.  Recommend she monitor BP at least a few mornings a week at home and document.  DASH diet at home.  Continue current medication regimen and adjust as needed -- continue collaboration with cardiology.  Labs today: BMP.           Digestive   Diabetic gastroparesis associated with type 2 diabetes mellitus (HCC)    Stable, no further discomfort.  Continue current diabetic regimen as ordered by endo and collaboration with both GI and endocrinology.  Continue to collaborate with CCM team as needed.         Endocrine   CKD stage 3 due to type 2 diabetes mellitus (HCC)    Chronic, ongoing, followed by nephrology.  Recent notes and labs reviewed.  Continue current medications as prescribed by them.  BMP today, urine ALB 150  on check recent with endo.  Continue focus on vegan style diet.      Relevant Orders    Basic metabolic panel   Hyperlipidemia associated with type 2 diabetes mellitus ([place])    Chronic, ongoing with poor tolerance to statins.  Followed by cardiology.  Continue Praluent, as is intolerant to statins.  Lipid panel today.      Relevant Orders   Lipid Panel w/o Chol/HDL Ratio   Hyperparathyroidism due to renal insufficiency (HCC)    Chronic, ongoing, followed by nephrology.  Continue this collaboration - recent note and labs reviewed.      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) - Primary    Chronic, ongoing, followed by nephrology.  Continue this collaboration - recent note and labs reviewed.  Urine ALB 150 with endo recently.        Other   Acute left ankle pain    Acute and ongoing, DVT not noted on ER imaging.  At this time check labs today to include BMP, CRP, ESR, and uric acid.  Recommend OTC Voltaren gel and ice for discomfort + wear compression ankle support sleeve.  Return in one week for follow-up.        Relevant Orders   Uric acid   C-reactive protein   Sed Rate (ESR)   Magnesium   Anemia    Chronic, ongoing -- CKD anemia -- suspect cause of easier bruising, discussed with patient.  Continue collaboration with nephrology.      Long term (current) use of insulin ([place])    Ongoing, continue collaboration with endocrinology.      Obesity    BMI 46.00 with T2DM, HF, CKD.  Recommended eating smaller high protein, low fat meals more frequently and exercising 30 mins a day 5 times a week with a goal of 10-15lb weight loss in the next 3 months. Patient voiced their understanding and motivation to adhere to these recommendations.         Follow up plan: Return in about 1 week (around 05/28/2022) for Virtual -- ankle pain.

## 2022-05-21 NOTE — Assessment & Plan Note (Signed)
Noted on past imaging.  Continue Plavix and Praluent for prevention. 

## 2022-05-21 NOTE — Assessment & Plan Note (Signed)
Ongoing, continue collaboration with endocrinology. 

## 2022-05-21 NOTE — Assessment & Plan Note (Signed)
Chronic, ongoing with poor tolerance to statins.  Followed by cardiology.  Continue Praluent, as is intolerant to statins.  Lipid panel today.

## 2022-05-21 NOTE — Assessment & Plan Note (Signed)
Chronic, stable, followed by cardiology.  Continue this collaboration and medication regimen as prescribed by them. 

## 2022-05-21 NOTE — Assessment & Plan Note (Addendum)
Chronic, ongoing.  BP at goal today.  Recommend she monitor BP at least a few mornings a week at home and document.  DASH diet at home.  Continue current medication regimen and adjust as needed -- continue collaboration with cardiology.  Labs today: BMP.

## 2022-05-21 NOTE — Assessment & Plan Note (Addendum)
Chronic, ongoing, followed by cardiology.  Euvolemic today.  Continue current medication regimen as prescribed by them.  Recommend: - Reminded to call for an overnight weight gain of >2 pounds or a weekly weight gain of >5 pounds - not adding salt to food and read food labels. Reviewed the importance of keeping daily sodium intake to <2000mg daily - Avoid all NSAID products 

## 2022-05-21 NOTE — Assessment & Plan Note (Signed)
Chronic, ongoing, followed by nephrology.  Continue this collaboration - recent note and labs reviewed.  Urine ALB 150 with endo recently. 

## 2022-05-21 NOTE — Assessment & Plan Note (Signed)
Chronic, ongoing -- CKD anemia -- suspect cause of easier bruising, discussed with patient.  Continue collaboration with nephrology.

## 2022-05-21 NOTE — Assessment & Plan Note (Signed)
Acute and ongoing, DVT not noted on ER imaging.  At this time check labs today to include BMP, CRP, ESR, and uric acid.  Recommend OTC Voltaren gel and ice for discomfort + wear compression ankle support sleeve.  Return in one week for follow-up.

## 2022-05-21 NOTE — Assessment & Plan Note (Signed)
Stable, no further discomfort.  Continue current diabetic regimen as ordered by endo and collaboration with both GI and endocrinology.  Continue to collaborate with CCM team as needed.  

## 2022-05-22 ENCOUNTER — Other Ambulatory Visit: Payer: Self-pay | Admitting: Nurse Practitioner

## 2022-05-22 ENCOUNTER — Telehealth: Payer: Self-pay | Admitting: Nurse Practitioner

## 2022-05-22 DIAGNOSIS — E875 Hyperkalemia: Secondary | ICD-10-CM

## 2022-05-22 DIAGNOSIS — M109 Gout, unspecified: Secondary | ICD-10-CM

## 2022-05-22 LAB — LIPID PANEL W/O CHOL/HDL RATIO
Cholesterol, Total: 113 mg/dL (ref 100–199)
HDL: 47 mg/dL (ref >39)
LDL Chol Calc (NIH): 52 mg/dL (ref 0–99)
Triglycerides: 65 mg/dL (ref 0–149)
VLDL Cholesterol Cal: 14 mg/dL (ref 5–40)

## 2022-05-22 LAB — MAGNESIUM: Magnesium: 2.5 mg/dL — ABNORMAL HIGH (ref 1.6–2.3)

## 2022-05-22 LAB — SEDIMENTATION RATE: Sed Rate: 46 mm/hr — ABNORMAL HIGH (ref 0–32)

## 2022-05-22 LAB — URIC ACID: Uric Acid: 7.7 mg/dL — ABNORMAL HIGH (ref 2.6–6.2)

## 2022-05-22 LAB — BASIC METABOLIC PANEL
BUN/Creatinine Ratio: 24 — ABNORMAL HIGH (ref 9–23)
BUN: 48 mg/dL — ABNORMAL HIGH (ref 6–24)
CO2: 18 mmol/L — ABNORMAL LOW (ref 20–29)
Calcium: 9.1 mg/dL (ref 8.7–10.2)
Chloride: 107 mmol/L — ABNORMAL HIGH (ref 96–106)
Creatinine, Ser: 2.04 mg/dL — ABNORMAL HIGH (ref 0.57–1.00)
Glucose: 80 mg/dL (ref 70–99)
Potassium: 6.3 mmol/L — ABNORMAL HIGH (ref 3.5–5.2)
Sodium: 137 mmol/L (ref 134–144)
eGFR: 31 mL/min/{1.73_m2} — ABNORMAL LOW (ref >59)

## 2022-05-22 LAB — C-REACTIVE PROTEIN: CRP: 11 mg/L — ABNORMAL HIGH (ref 0–10)

## 2022-05-22 MED ORDER — PREDNISONE 20 MG PO TABS: 40.0000 mg | ORAL_TABLET | Freq: Every day | ORAL | 0 refills | Status: DC

## 2022-05-22 MED ORDER — ALLOPURINOL 100 MG PO TABS: 50.0000 mg | ORAL_TABLET | Freq: Every day | ORAL | 4 refills | Status: DC

## 2022-05-22 NOTE — Telephone Encounter (Signed)
Spoke to patient on telephone and she is aware to stop Irbesartan at this time.  She reports she will try to stay on [place] too.

## 2022-05-22 NOTE — Progress Notes (Signed)
FYI for you both, saw patient yesterday and she continues not to consistently take Lokelma, which I see she has discussed with you all too.  I again told her to ensure to take this as ordered as her level is 6.3 this check on K+.  Discussed with her risks with high potassium levels.  She also is having initial gout flare to right ankle, pain/swelling and can not walk on with elevation in uric acid + CRP/ESR.  I am treating this and also starting renal dose Allopurinol as suspect she will not adhere to diet changes.;)

## 2022-05-22 NOTE — Progress Notes (Signed)
Need lab visit for 1 week to recheck potassium please.

## 2022-05-22 NOTE — Progress Notes (Signed)
Contacted via MyChart   Good afternoon Ottis, here are you labs as we discussed on phone: - Uric acid level is elevated at 7.7, as are CRP and ESR -- which is why I suspect this ankle pain is gout related pain.  I have sent in the 5 days of Prednisone and then Allopurinol for you to start daily to help regulate levels as suspect related to kidney disease. - Magnesium level a little high, if taking magnesium supplement then hold off on this. - Potassium is elevated -- at 6.3 -- PLEASE ensure you take your Lokelma as ordered and no missing doses as high potassium can lead to serious issues, we need to get these down and keep down.:) - Kidney function remains at previous level. - Cholesterol levels are at goal, continue injection.  Any questions? Keep being amazing!!  Thank you for allowing me to participate in your care.  I appreciate you. Kindest regards,  

## 2022-05-22 NOTE — Addendum Note (Signed)
Addended by: Marnee Guarneri T on: 05/22/2022 12:02 PM   Modules accepted: Orders

## 2022-05-22 NOTE — Telephone Encounter (Signed)
-----   Message from Wellington Hampshire, MD sent at 05/22/2022  1:26 PM EDT ----- Due to repeated issues with hyperkalemia, I recommend stopping irbesartan.

## 2022-05-23 DIAGNOSIS — J301 Allergic rhinitis due to pollen: Secondary | ICD-10-CM | POA: Diagnosis not present

## 2022-05-24 ENCOUNTER — Other Ambulatory Visit: Payer: Self-pay | Admitting: Nurse Practitioner

## 2022-05-24 MED ORDER — ACCU-CHEK AVIVA PLUS VI STRP: ORAL_STRIP | 12 refills | Status: DC

## 2022-05-25 NOTE — Patient Instructions (Signed)
Gout  Gout is painful swelling of your joints. Gout is a type of arthritis. It is caused by having too much uric acid in your body. Uric acid is a chemical that is made when your body breaks down substances called purines. If your body has too much uric acid, sharp crystals can form and build up in your joints. This causes pain and swelling. Gout attacks can happen quickly and be very painful (acute gout). Over time, the attacks can affect more joints and happen more often (chronic gout). What are the causes? Gout is caused by too much uric acid in your blood. This can happen because: Your kidneys do not remove enough uric acid from your blood. Your body makes too much uric acid. You eat too many foods that are high in purines. These foods include organ meats, some seafood, and beer. Trauma or stress can bring on an attack. What increases the risk? Having a family history of gout. Being female and middle-aged. Being female and having gone through menopause. Having an organ transplant. Taking certain medicines. Having certain conditions, such as: Being very overweight (obese). Lead poisoning. Kidney disease. A skin condition called psoriasis. Other risks include: Losing weight too quickly. Not having enough water in the body (being dehydrated). Drinking alcohol, especially beer. Drinking beverages that are sweetened with a type of sugar called fructose. What are the signs or symptoms? An attack of acute gout often starts at night and usually happens in just one joint. The most common place is the big toe. Other joints that may be affected include joints of the feet, ankle, knee, fingers, wrist, or elbow. Symptoms may include: Very bad pain. Warmth. Swelling. Stiffness. Tenderness. The affected joint may be very painful to touch. Shiny, red, or purple skin. Chills and fever. Chronic gout may cause symptoms more often. More joints may be involved. You may also have white or yellow lumps  (tophi) on your hands or feet or in other areas near your joints. How is this treated? Treatment for an acute attack may include medicines for pain and swelling, such as: NSAIDs, such as ibuprofen. Steroids taken by mouth or injected into a joint. Colchicine. This can be given by mouth or through an IV tube. Treatment to prevent future attacks may include: Taking small doses of NSAIDs or colchicine daily. Using a medicine that reduces uric acid levels in your blood, such as allopurinol. Making changes to your diet. You may need to see a food expert (dietitian) about what to eat and drink to prevent gout. Follow these instructions at home: During a gout attack  If told, put ice on the painful area. To do this: Put ice in a plastic bag. Place a towel between your skin and the bag. Leave the ice on for 20 minutes, 2-3 times a day. Take off the ice if your skin turns bright red. This is very important. If you cannot feel pain, heat, or cold, you have a greater risk of damage to the area. Raise the painful joint above the level of your heart as often as you can. Rest the joint as much as possible. If the joint is in your leg, you may be given crutches. Follow instructions from your doctor about what you cannot eat or drink. Avoiding future gout attacks Eat a low-purine diet. Avoid foods and drinks such as: Liver. Kidney. Anchovies. Asparagus. Herring. Mushrooms. Mussels. Beer. Stay at a healthy weight. If you want to lose weight, talk with your doctor. Do not   lose weight too fast. Start or continue an exercise plan as told by your doctor. Eating and drinking Avoid drinks sweetened by fructose. Drink enough fluids to keep your pee (urine) pale yellow. If you drink alcohol: Limit how much you have to: 0-1 drink a day for women who are not pregnant. 0-2 drinks a day for men. Know how much alcohol is in a drink. In the [place] one drink equals one 12 oz bottle of beer (355 mL), one 5 oz  glass of wine (148 mL), or one 1 oz glass of hard liquor (44 mL). General instructions Take over-the-counter and prescription medicines only as told by your doctor. Ask your doctor if you should avoid driving or using machines while you are taking your medicine. Return to your normal activities when your doctor says that it is safe. Keep all follow-up visits. Where to find more information National Institutes of Health: www.niams.nih.gov Contact a doctor if: You have another gout attack. You still have symptoms of a gout attack after 10 days of treatment. You have problems (side effects) because of your medicines. You have chills or a fever. You have burning pain when you pee (urinate). You have pain in your lower back or belly. Get help right away if: You have very bad pain. Your pain cannot be controlled. You cannot pee. Summary Gout is painful swelling of the joints. The most common site of pain is the big toe, but it can affect other joints. Medicines and avoiding some foods can help to prevent and treat gout attacks. This information is not intended to replace advice given to you by your health care provider. Make sure you discuss any questions you have with your health care provider. Document Revised: 08/29/2021 Document Reviewed: 08/29/2021 Elsevier Patient Education  2023 [employer]  

## 2022-05-27 DIAGNOSIS — J301 Allergic rhinitis due to pollen: Secondary | ICD-10-CM | POA: Diagnosis not present

## 2022-05-28 ENCOUNTER — Encounter: Payer: Self-pay | Admitting: Nurse Practitioner

## 2022-05-28 ENCOUNTER — Other Ambulatory Visit: Payer: Medicare Other

## 2022-05-28 ENCOUNTER — Telehealth (INDEPENDENT_AMBULATORY_CARE_PROVIDER_SITE_OTHER): Payer: Medicare Other | Admitting: Nurse Practitioner

## 2022-05-28 DIAGNOSIS — M109 Gout, unspecified: Secondary | ICD-10-CM | POA: Diagnosis not present

## 2022-05-28 DIAGNOSIS — E875 Hyperkalemia: Secondary | ICD-10-CM | POA: Diagnosis not present

## 2022-05-28 NOTE — Progress Notes (Signed)
There were no vitals taken for this visit.   Subjective:    Patient ID: [name] [name], female    DOB: 21-Sep-1981, 41 y.o.   MRN: [id number]  HPI: [name] [name] is a 41 y.o. female  Chief Complaint  Patient presents with   Ankle Pain    Patient is here for a follow up on ankle pain. Patient states she is still experiencing the pain in her ankle. Patient states she just started the medication of Prednisone and Allopurinol. Patient state she does not notice a difference as she started on Saturday.   This visit was completed via video visit through MyChart due to the restrictions of the COVID-19 pandemic. All issues as above were discussed and addressed. Physical exam was done as above through visual confirmation on video through MyChart. If it was felt that the patient should be evaluated in the office, they were directed there. The patient verbally consented to this visit. Location of the patient: home Location of the provider: work Those involved with this call:  Provider: Marnee Guarneri, DNP CMA: Irena Reichmann, [place] Desk/Registration: [employer]  Time spent on call:  21 minutes with patient face to face via video conference. More than 50% of this time was spent in counseling and coordination of care. 15 minutes total spent in review of patient's record and preparation of their chart.  I verified patient identity using two factors (patient name and date of birth). Patient consents verbally to being seen via telemedicine visit today.    ANKLE PAIN (LEFT) Follow-up for ankle pain, recently sent in Allopurinol and a burst of Prednisone as there was noted to be mild elevation in uric acid and CRP recent labs.  Was seen in ER 05/09/22 to ensure no DVT, with imaging noting no DVT. None to the right lower leg noted.  She reports there is still some ankle pain, but this is improving --  has improved 90%.  She can now put weight on it and move.  Just started medications on Saturday.   Prednisone has caused some increase in sugars and BP.  No recent falls or fractures.  Duration: days Pain: yes Severity: 0/10  Quality:  sharp and throbbing Location:  left lower leg Bilateral:  no Onset: sudden Frequency: constant Paresthesias:   no Decreased sensation:  no Weakness:    sometimes Insomnia:   no Fatigue:   no Alleviating factors: nothing Aggravating factors: walking on it  Status: fluctuating Treatments attempted: Tylenol and as above  Relevant past medical, surgical, family and social history reviewed and updated as indicated. Interim medical history since our last visit reviewed. Allergies and medications reviewed and updated.  Review of Systems  Constitutional:  Negative for activity change, appetite change, diaphoresis, fatigue and fever.  Respiratory:  Negative for cough, chest tightness and shortness of breath.   Cardiovascular:  Negative for chest pain, palpitations and leg swelling.  Musculoskeletal:  Negative for arthralgias.  Neurological: Negative.   Psychiatric/Behavioral: Negative.      Per HPI unless specifically indicated above     Objective:    There were no vitals taken for this visit.  Wt Readings from Last 3 Encounters:  05/21/22 276 lb 6.4 oz (125.4 kg)  04/04/22 268 lb 8 oz (121.8 kg)  02/20/22 269 lb 6.4 oz (122.2 kg)    Physical Exam Vitals and nursing note reviewed.  Constitutional:      General: She is awake. She is not in acute distress.    Appearance:  She is well-developed and well-groomed. She is obese. She is not ill-appearing or toxic-appearing.  HENT:     Head: Normocephalic.     Right Ear: Hearing normal.     Left Ear: Hearing normal.  Eyes:     General: Lids are normal.        Right eye: No discharge.        Left eye: No discharge.     Conjunctiva/sclera: Conjunctivae normal.  Pulmonary:     Effort: Pulmonary effort is normal. No accessory muscle usage or respiratory distress.  Musculoskeletal:     Cervical  back: Normal range of motion.  Neurological:     Mental Status: She is alert and oriented to person, place, and time.  Psychiatric:        Attention and Perception: Attention normal.        Mood and Affect: Mood normal.        Behavior: Behavior normal. Behavior is cooperative.        Thought Content: Thought content normal.        Judgment: Judgment normal.     Results for orders placed or performed in visit on 05/21/22  Lipid Panel w/o Chol/HDL Ratio  Result Value Ref Range   Cholesterol, Total 113 100 - 199 mg/dL   Triglycerides 65 0 - 149 mg/dL   HDL 47 >39 mg/dL   VLDL Cholesterol Cal 14 5 - 40 mg/dL   LDL Chol Calc (NIH) 52 0 - 99 mg/dL  Basic metabolic panel  Result Value Ref Range   Glucose 80 70 - 99 mg/dL   BUN 48 (H) 6 - 24 mg/dL   Creatinine, Ser 2.04 (H) 0.57 - 1.00 mg/dL   eGFR 31 (L) >59 mL/min/1.73   BUN/Creatinine Ratio 24 (H) 9 - 23   Sodium 137 134 - 144 mmol/L   Potassium 6.3 (H) 3.5 - 5.2 mmol/L   Chloride 107 (H) 96 - 106 mmol/L   CO2 18 (L) 20 - 29 mmol/L   Calcium 9.1 8.7 - 10.2 mg/dL  Uric acid  Result Value Ref Range   Uric Acid 7.7 (H) 2.6 - 6.2 mg/dL  C-reactive protein  Result Value Ref Range   CRP 11 (H) 0 - 10 mg/L  Sed Rate (ESR)  Result Value Ref Range   Sed Rate 46 (H) 0 - 32 mm/hr  Magnesium  Result Value Ref Range   Magnesium 2.5 (H) 1.6 - 2.3 mg/dL      Assessment & Plan:   Problem List Items Addressed This Visit       Musculoskeletal and Integument   Gouty arthritis    Left ankle pain is improving.  At this time have recommended she stop Prednisone due to elevations BP and sugar -- would avoid Colchicine and Meloxicam for pain as well due to CKD and heart.  At this time continue renal dosed Allopurinol 50 MG daily as we are seeing benefit to pain with this.  She is aware to use ice to ankle for any acute increase pain when on feet, for now continue regimen as ordered.  Labs today to recheck uric acid, BMP, CRP, ESR.  Consider  ortho referral if worsening pain.       I discussed the assessment and treatment plan with the patient. The patient was provided an opportunity to ask questions and all were answered. The patient agreed with the plan and demonstrated an understanding of the instructions.   The patient was advised to call back  or seek an in-person evaluation if the symptoms worsen or if the condition fails to improve as anticipated.   I provided 21+ minutes of time during this encounter.   Follow up plan: Return in about 6 months (around 11/27/2022) for T2DM, HTN/HLD, CKD, HF, GOUT.

## 2022-05-28 NOTE — Assessment & Plan Note (Signed)
Left ankle pain is improving.  At this time have recommended she stop Prednisone due to elevations BP and sugar -- would avoid Colchicine and Meloxicam for pain as well due to CKD and heart.  At this time continue renal dosed Allopurinol 50 MG daily as we are seeing benefit to pain with this.  She is aware to use ice to ankle for any acute increase pain when on feet, for now continue regimen as ordered.  Labs today to recheck uric acid, BMP, CRP, ESR.  Consider ortho referral if worsening pain.

## 2022-05-29 ENCOUNTER — Other Ambulatory Visit: Payer: Self-pay | Admitting: Nurse Practitioner

## 2022-05-29 DIAGNOSIS — E875 Hyperkalemia: Secondary | ICD-10-CM | POA: Insufficient documentation

## 2022-05-29 DIAGNOSIS — J301 Allergic rhinitis due to pollen: Secondary | ICD-10-CM | POA: Diagnosis not present

## 2022-05-29 DIAGNOSIS — M109 Gout, unspecified: Secondary | ICD-10-CM

## 2022-05-29 LAB — C-REACTIVE PROTEIN: CRP: 14 mg/L — ABNORMAL HIGH (ref 0–10)

## 2022-05-29 LAB — BASIC METABOLIC PANEL
BUN/Creatinine Ratio: 29 — ABNORMAL HIGH (ref 9–23)
BUN: 45 mg/dL — ABNORMAL HIGH (ref 6–24)
CO2: 15 mmol/L — ABNORMAL LOW (ref 20–29)
Calcium: 8.7 mg/dL (ref 8.7–10.2)
Chloride: 109 mmol/L — ABNORMAL HIGH (ref 96–106)
Creatinine, Ser: 1.57 mg/dL — ABNORMAL HIGH (ref 0.57–1.00)
Glucose: 156 mg/dL — ABNORMAL HIGH (ref 70–99)
Potassium: 5.3 mmol/L — ABNORMAL HIGH (ref 3.5–5.2)
Sodium: 137 mmol/L (ref 134–144)
eGFR: 42 mL/min/{1.73_m2} — ABNORMAL LOW (ref >59)

## 2022-05-29 LAB — URIC ACID: Uric Acid: 6.8 mg/dL — ABNORMAL HIGH (ref 2.6–6.2)

## 2022-05-29 LAB — SEDIMENTATION RATE: Sed Rate: 77 mm/h — ABNORMAL HIGH (ref 0–32)

## 2022-05-29 NOTE — Progress Notes (Signed)
Pt scheduled  

## 2022-05-29 NOTE — Progress Notes (Signed)
Contacted via [facility] -- 4 week lab only visit please   Good morning [name] [name], your labs have returned: - Good news, uric acid level is trending down to 6.8 -- was 7.7.  Would like to see under 6, so continue the renal dosed Allopurinol daily to help with this.  Your ESR is still elevated, awaiting CRP (inflammatory markers).  This could be related to multiple factors, but gout may have pushed them up some.  Will continue to monitor. - Glucose level was not too bad, a little higher then last check.  Definitely stay off Prednisone.  Kidney function labs a bit better this check.  Potassium coming down -- continue current plan.  Any questions? I would like you to return in 4 weeks for lab only visit to recheck uric acid and potassium. Keep being amazing!!  Thank you for allowing me to participate in your care.  I appreciate you. Kindest regards, Ashawna Hanback

## 2022-05-30 ENCOUNTER — Encounter: Payer: Self-pay | Admitting: Nurse Practitioner

## 2022-05-30 ENCOUNTER — Encounter: Payer: Self-pay | Admitting: Cardiovascular Disease

## 2022-05-30 DIAGNOSIS — J301 Allergic rhinitis due to pollen: Secondary | ICD-10-CM | POA: Diagnosis not present

## 2022-05-31 ENCOUNTER — Ambulatory Visit (INDEPENDENT_AMBULATORY_CARE_PROVIDER_SITE_OTHER): Payer: Medicare Other | Admitting: Nurse Practitioner

## 2022-05-31 ENCOUNTER — Encounter: Payer: Self-pay | Admitting: Nurse Practitioner

## 2022-05-31 VITALS — BP 156/86 | HR 72 | Ht 65.0 in | Wt 282.4 lb

## 2022-05-31 DIAGNOSIS — E785 Hyperlipidemia, unspecified: Secondary | ICD-10-CM

## 2022-05-31 DIAGNOSIS — N183 Chronic kidney disease, stage 3 unspecified: Secondary | ICD-10-CM | POA: Diagnosis not present

## 2022-05-31 DIAGNOSIS — E875 Hyperkalemia: Secondary | ICD-10-CM | POA: Diagnosis not present

## 2022-05-31 DIAGNOSIS — I5033 Acute on chronic diastolic (congestive) heart failure: Secondary | ICD-10-CM

## 2022-05-31 DIAGNOSIS — E1165 Type 2 diabetes mellitus with hyperglycemia: Secondary | ICD-10-CM

## 2022-05-31 DIAGNOSIS — I10 Essential (primary) hypertension: Secondary | ICD-10-CM

## 2022-05-31 DIAGNOSIS — Z794 Long term (current) use of insulin: Secondary | ICD-10-CM | POA: Diagnosis not present

## 2022-05-31 DIAGNOSIS — E1122 Type 2 diabetes mellitus with diabetic chronic kidney disease: Secondary | ICD-10-CM

## 2022-05-31 DIAGNOSIS — I25118 Atherosclerotic heart disease of native coronary artery with other forms of angina pectoris: Secondary | ICD-10-CM | POA: Diagnosis not present

## 2022-05-31 MED ORDER — FUROSEMIDE 40 MG PO TABS: 40.0000 mg | ORAL_TABLET | Freq: Two times a day (BID) | ORAL | 3 refills | Status: DC

## 2022-05-31 MED ORDER — ISOSORBIDE MONONITRATE ER 120 MG PO TB24: 120.0000 mg | ORAL_TABLET | Freq: Every day | ORAL | 3 refills | Status: DC

## 2022-06-03 DIAGNOSIS — J301 Allergic rhinitis due to pollen: Secondary | ICD-10-CM | POA: Diagnosis not present

## 2022-06-06 ENCOUNTER — Other Ambulatory Visit
Admission: RE | Admit: 2022-06-06 | Discharge: 2022-06-06 | Disposition: A | Discharge status: Home / Self Care | Payer: Medicare Other | Source: Ambulatory Visit | Attending: Nurse Practitioner | Admitting: Nurse Practitioner

## 2022-06-06 ENCOUNTER — Encounter: Payer: Self-pay | Admitting: Nurse Practitioner

## 2022-06-06 ENCOUNTER — Ambulatory Visit (INDEPENDENT_AMBULATORY_CARE_PROVIDER_SITE_OTHER): Payer: Medicare Other | Admitting: Nurse Practitioner

## 2022-06-06 ENCOUNTER — Other Ambulatory Visit: Payer: Self-pay | Admitting: *Deleted

## 2022-06-06 VITALS — BP 134/68 | HR 74 | Ht 65.0 in | Wt 278.0 lb

## 2022-06-06 DIAGNOSIS — I5032 Chronic diastolic (congestive) heart failure: Secondary | ICD-10-CM

## 2022-06-06 DIAGNOSIS — Z794 Long term (current) use of insulin: Secondary | ICD-10-CM | POA: Diagnosis not present

## 2022-06-06 DIAGNOSIS — E119 Type 2 diabetes mellitus without complications: Secondary | ICD-10-CM | POA: Diagnosis not present

## 2022-06-06 DIAGNOSIS — I2510 Atherosclerotic heart disease of native coronary artery without angina pectoris: Secondary | ICD-10-CM

## 2022-06-06 DIAGNOSIS — N183 Chronic kidney disease, stage 3 unspecified: Secondary | ICD-10-CM | POA: Diagnosis not present

## 2022-06-06 DIAGNOSIS — E875 Hyperkalemia: Secondary | ICD-10-CM

## 2022-06-06 DIAGNOSIS — E785 Hyperlipidemia, unspecified: Secondary | ICD-10-CM | POA: Diagnosis not present

## 2022-06-06 DIAGNOSIS — I255 Ischemic cardiomyopathy: Secondary | ICD-10-CM

## 2022-06-06 DIAGNOSIS — I10 Essential (primary) hypertension: Secondary | ICD-10-CM

## 2022-06-06 DIAGNOSIS — J301 Allergic rhinitis due to pollen: Secondary | ICD-10-CM | POA: Diagnosis not present

## 2022-06-06 LAB — BASIC METABOLIC PANEL
Anion gap: 10 (ref 5–15)
BUN: 48 mg/dL — ABNORMAL HIGH (ref 6–20)
CO2: 25 mmol/L (ref 22–32)
Calcium: 8.4 mg/dL — ABNORMAL LOW (ref 8.9–10.3)
Chloride: 102 mmol/L (ref 98–111)
Creatinine, Ser: 2.34 mg/dL — ABNORMAL HIGH (ref 0.44–1.00)
GFR, Estimated: 26 mL/min — ABNORMAL LOW (ref >60)
Glucose, Bld: 232 mg/dL — ABNORMAL HIGH (ref 70–99)
Potassium: 4.8 mmol/L (ref 3.5–5.1)
Sodium: 137 mmol/L (ref 135–145)

## 2022-06-06 MED ORDER — FUROSEMIDE 40 MG PO TABS: 40.0000 mg | ORAL_TABLET | Freq: Every day | ORAL | 3 refills | Status: DC

## 2022-06-06 MED ORDER — FUROSEMIDE 40 MG PO TABS: 40.0000 mg | ORAL_TABLET | ORAL | 3 refills | Status: DC

## 2022-06-06 NOTE — Patient Instructions (Signed)
Medication Instructions:  Your physician has recommended you make the following change in your medication:   DECREASE lasix to 40 mg daily. May take an extra 40 mg daily as needed for weight gain of 2 pounds or more in 24 hours   *If you need a refill on your cardiac medications before your next appointment, please call your pharmacy*   Lab Work:  Today: Engineer, production at [facility] 1st desk on the right to check in (REGISTRATION)  Lab hours: Monday- Friday (7:30 am- 5:30 pm)   If you have labs (blood work) drawn today and your tests are completely normal, you will receive your results only by: MyChart Message (if you have MyChart) OR A paper copy in the mail If you have any lab test that is abnormal or we need to change your treatment, we will call you to review the results.   Testing/Procedures:  Your physician has requested that you have an echocardiogram. Echocardiography is a painless test that uses sound waves to create images of your heart. It provides your doctor with information about the size and shape of your heart and how well your heart's chambers and valves are working. This procedure takes approximately one hour. There are no restrictions for this procedure.    Follow-Up: At [facility], you and your health needs are our priority.  As part of our continuing mission to provide you with exceptional heart care, we have created designated Provider Care Teams.  These Care Teams include your primary Cardiologist (physician) and Advanced Practice Providers (APPs -  Physician Assistants and Nurse Practitioners) who all work together to provide you with the care you need, when you need it.  We recommend signing up for the patient portal called "MyChart".  Sign up information is provided on this After Visit Summary.  MyChart is used to connect with patients for Virtual Visits (Telemedicine).  Patients are able to view lab/test results, encounter notes, upcoming  appointments, etc.  Non-urgent messages can be sent to your provider as well.   To learn more about what you can do with MyChart, go to [email or web address].    Your next appointment:   1 month(s)  The format for your next appointment:   In Person  Provider:   You may see Kathlyn Sacramento, MD or one of the following Advanced Practice Providers on your designated Care Team:   Murray Hodgkins, NP Christell Faith, PA-C Cadence Kathlen Mody, [place]   Other Instructions N/A  Important Information About Sugar

## 2022-06-06 NOTE — Progress Notes (Signed)
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  Past Medical History    Past Medical History:  Diagnosis Date   CAD (coronary artery disease)    a. 03/2015 NSTEMI/PCI: LCX 125m(DES), OM1 100p (DES - vessel labeled Ramus in subsequent cath report); b. 04/2015 Cath: patent stents/stable anatomy; c. 02/2016 MV: No isch; d. 11/2018 PCI: LCX 95p/m (PTCA->20%); e. 01/2019 PCI: RCA 85p/m (2.5x32 Synergy DES); f. 01/2021 Cath: LM 30, LAD 40ost/p, 55m, LCX 55p restenosis, OM1 10, OM2 40, RCA patent stent-->Med rx.   Chronic abdominal pain    Chronic diastolic (congestive) heart failure ([facility] [place]   a. 03/2015 Echo: EF 30-35%, mild concentric LVH, severe HK of inf, inflat, & lat walls, mod MR, mild TR;  b. 04/2015 LV Gram: EF 55-65%; c. 09/2017 Echo: EF 55-60%, no rwma. Mild MR; d. 11/2018 Echo: Ef 60-65%, no rwma.   CKD (chronic kidney disease), stage III ([facility]   Diabetes type 2, controlled ([facility]   a. since 2002    Diabetic gastroparesis (HCC)    a. chronic nausea/vomiting.   Diabetic retinopathy (H[facility]   Diverticulosis, sigmoid 07/2016   Fatty liver    GERD (gastroesophageal reflux disease)    Heavy menses    a. H/O IUD - expired in 2014 - remains in place.   Hx of migraines    Hyperlipidemia    Intolerant of atorvastatin, rosuvastatin   Hypertension    Iron deficiency anemia    Ischemic cardiomyopathy (resolved)    a. 03/2015 EF 30-35% post NSTEMI;  b. 04/2015 EF 55-65% on LV gram; c. 09/2017 Echo: EF 55-60%; d. 11/2018 Echo: Ef 60-65%.    Obesity    Tobacco abuse    a. quit 03/2015.   Uterine fibroid    Vertigo    Vitamin D deficiency    Past Surgical History:  Procedure Laterality Date   APPENDECTOMY     CARDIAC CATHETERIZATION  03/2015   x2 stent [place]/A 05/05/2015   Procedure: Left Heart Cath and Coronary Angiography;  Surgeon: Peter M JoMartiniqueMD;  [facility];  Service: Cardiovascular;  Laterality: N/A;   CATARACT EXTRACTION Left 09/12/2021   CATARACT EXTRACTION Right 10/10/2021   COLONOSCOPY WITH PROPOFOL N/A 07/31/2016   Procedure: COLONOSCOPY WITH PROPOFOL;  Surgeon: MaLollie SailsMD;  [facility];  Service: Endoscopy;  Laterality: N/A;   CORONARY BALLOON ANGIOPLASTY N/A 12/04/2018   Procedure: CORONARY BALLOON ANGIOPLASTY;  Surgeon: ArWellington HampshireMD;  [facility];  Service: Cardiovascular;  Laterality: N/A;   CORONARY STENT INTERVENTION N/A 01/12/2019   Procedure: CORONARY STENT INTERVENTION;  Surgeon: EnNelva BushMD;  [facility];  Service: Cardiovascular;  Laterality: N/A;   ESOPHAGOGASTRODUODENOSCOPY (EGD) WITH PROPOFOL N/A 07/31/2016   Procedure: ESOPHAGOGASTRODUODENOSCOPY (EGD) WITH PROPOFOL;  Surgeon: MaLollie SailsMD;  [facility];  Service: Endoscopy;  Laterality: N/A;   INTRAVASCULAR PRESSURE WIRE/FFR STUDY N/A 12/04/2018   Procedure: INTRAVASCULAR PRESSURE WIRE/FFR STUDY;  Surgeon: ArWellington HampshireMD;  [facility];  Service: Cardiovascular;  Laterality: N/A;   LAPAROSCOPIC APPENDECTOMY N/A 08/07/2016   Procedure: APPENDECTOMY LAPAROSCOPIC;  Surgeon: Hubbard Robinson, MD;  [facility];  Service: General;  Laterality: N/A;   LEFT HEART CATH AND CORONARY ANGIOGRAPHY Left 12/04/2018   Procedure: LEFT HEART CATH AND CORONARY ANGIOGRAPHY;  Surgeon: Wellington Hampshire, MD;  Location: [place] [facility] LAB;  Service: Cardiovascular;  Laterality: Left;    LEFT HEART CATH AND CORONARY ANGIOGRAPHY N/A 01/12/2019   Procedure: LEFT HEART CATH AND CORONARY ANGIOGRAPHY;  Surgeon: [name], MD;  Location: [place] [facility] LAB;  Service: Cardiovascular;  Laterality: N/A;   MOUTH SURGERY     RIGHT/LEFT HEART CATH AND CORONARY ANGIOGRAPHY N/A 01/29/2021   Procedure: RIGHT/LEFT HEART CATH AND CORONARY ANGIOGRAPHY;  Surgeon: Wellington Hampshire, MD;  Location: [place] CV LAB;  Service: Cardiovascular;  Laterality: N/A;   VITRECTOMY Left 10/30/2021    Allergies  Allergies  Allergen Reactions   Lisinopril Anaphylaxis, Itching and Swelling   Other Itching and Swelling    [place] seasoning    Rosemary Oil Anaphylaxis   Shellfish Allergy Itching and Swelling    Pt states that she HAS had IV Contrast without any reaction   Statins Other (See Comments)    Myalgias   Metformin And Related Diarrhea, Nausea And Vomiting and Rash    History of Present Illness    41 year old female with the above past medical history including CAD status post multiple interventions, diabetes, hypertension, hyperlipidemia, statin intolerance, ischemic cardiomyopathy, 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  Cardiac history dates back to April 2016, when she was hospitalized with non-STEMI.  She was found to have LV dysfunction with an EF of 30 to 35%.  Diagnostic catheterization revealed severe left circumflex and OM1 disease.  Both areas were successfully treated with drug-eluting stents.  Subsequent catheterization in May 2016, showed patency of both stents and normalization of LV function by ventriculography.  She had nonischemic stress testing in March 2017, but in the setting of recurrent angina in December 2019, she underwent diagnostic catheterization which showed patent OM1 stent with focal in-stent restenosis in the left circumflex at the origin of the OM 2, and new significant stenosis in the  proximal to mid right coronary artery.  Balloon angioplasty was performed of the mid left circumflex and she subsequently underwent staged PCI of the RCA in early 2020.  Echo in December 2019 showed an EF of 60 to 65%.  In February 2022, she underwent right and left heart catheterization in the setting of chest pain and dyspnea.  This showed patent stents in the RCA, left circumflex, and OM1.  There was moderate restenosis in the mid left circumflex at the site of prior PTCA.  She was medically managed.  Ms. Spainhower was last seen in cardiology clinic on June 23, in the setting of 14 pound weight gain with increasing swelling of her hands, face, and lower extremities, as well as an increase in abdominal girth.  She also reported increasing dyspnea on exertion and occasional left arm squeezing discomfort.  She was hypertensive at 156/86.  Lasix was increased to 40 mg twice daily with plans for repeat labs today, as well as a follow-up echo.  In the setting of hypertension and CAD, Imdur was increased to 120 mg daily.  Over the past week, she has noted improved urine output with weight loss and significant improvement in swelling.  She feels that she is back to her baseline and her weight is down 4 pounds on our scale.  She has not had any recurrence of left arm tightness and denies chest pain, dyspnea, palpitations, PND, orthopnea, dizziness, syncope, edema, or early satiety.  Home Medications    Current Outpatient Medications  Medication Sig Dispense Refill   ACCU-CHEK AVIVA PLUS test strip USE 1 STRIP TO CHECK GLUCOSE THREE TIMES DAILY.  DX E11.29 100 each 12   acetaminophen (TYLENOL) 500 MG tablet Take 1,000 mg by mouth every 4 (four) hours as needed.     Alirocumab (PRALUENT) 150 MG/ML SOAJ Inject 1 mL into the skin every 14 (fourteen) days. 2 mL 11   allopurinol (ZYLOPRIM) 100 MG tablet Take 0.5 tablets (50 mg total) by mouth daily. 45 tablet 4   aspirin EC 81 MG tablet Take 81 mg by mouth daily.      BD PEN NEEDLE MICRO U/F 32G X 6 MM MISC Inject into the skin.     carvedilol (COREG) 12.5 MG tablet Take 1 tablet (12.5 mg total) by mouth 2 (two) times daily. 180 tablet 1   clopidogrel (PLAVIX) 75 MG tablet Take 1 tablet (75 mg total) by mouth daily. 90 tablet 3   Continuous Blood Gluc Receiver (DEXCOM G6 RECEIVER) DEVI      Continuous Blood Gluc Sensor (DEXCOM G6 SENSOR) MISC See admin instructions.     Continuous Blood Gluc Transmit (DEXCOM G6 TRANSMITTER) MISC      EPINEPHrine 0.3 mg/0.3 mL IJ SOAJ injection See admin instructions.     EQ ALLERGY RELIEF, CETIRIZINE, 10 MG tablet Take 10 mg by mouth daily as needed.     ezetimibe (ZETIA) 10 MG tablet Take 1 tablet (10 mg total) by mouth daily. 90 tablet 3   famotidine (PEPCID) 40 MG tablet Take 1 tablet (40 mg total) by mouth daily. 90 tablet 4   Fluticasone Propionate (XHANCE) 93 MCG/ACT EXHU daily.     furosemide (LASIX) 40 MG tablet Take 1 tablet (40 mg total) by mouth 2 (two) times daily. 180 tablet 3   insulin aspart (NOVOLOG) 100 UNIT/ML injection Inject 10 Units into the skin 3 (three) times daily with meals. 10 mL 11   Insulin Glargine (BASAGLAR KWIKPEN) 100 UNIT/ML Inject 70 Units into the skin daily.     isosorbide mononitrate (IMDUR) 120 MG 24 hr tablet Take 1 tablet (120 mg total) by mouth daily. 90 tablet 3   LOKELMA 10 g PACK packet Take 10 g by mouth daily.     loperamide (IMODIUM) 2 MG capsule Take 2 mg by mouth as needed for diarrhea or loose stools.     meclizine (ANTIVERT) 25 MG tablet Take 25 mg by mouth 3 (three) times daily as needed for dizziness.     nitroGLYCERIN (NITROSTAT) 0.4 MG SL tablet Take 1 tab under your tongue while sitting for chest pain, If no relief may repeat, one tab every 5 min up to 3 tabs total over 15 mins. 25 tablet 3   pantoprazole (PROTONIX) 40 MG tablet Take 1 tablet (40 mg total) by mouth 2 (two) times daily. 180 tablet 4   promethazine (PHENERGAN) 12.5 MG tablet TAKE 1 TABLET BY MOUTH EVERY 6  HOURS AS NEEDED FOR NAUSEA AND VOMITING 30 tablet 2   etonogestrel (NEXPLANON) 68 MG IMPL implant 1 each (68 mg total) by Subdermal route once for 1 dose. 1 each 0   No current facility-administered medications for this visit.  Facility-Administered Medications Ordered in Other Visits  Medication Dose Route Frequency Provider Last Rate Last Admin   sodium chloride flush (NS) 0.9 % injection 3 mL  3 mL Intravenous Q12H Visser, Jacquelyn D, PA-C         Review of Systems    Much improved compared to last week.  She denies chest pain, palpitations, dyspnea, pnd, orthopnea, n, v, dizziness, syncope, edema, weight gain, or early satiety.  All other systems reviewed and are otherwise negative except as noted above.    Physical Exam    VS:  BP 134/68   Pulse 74   Ht '5\' 5"'$  (1.651 m)   Wt 278 lb (126.1 kg)   SpO2 98%   BMI 46.26 kg/m  , BMI Body mass index is 46.26 kg/m.     Vitals:   06/06/22 1259 06/06/22 1319  BP: 140/90 134/68  Pulse: 74   SpO2: 98%     GEN: Obese, in no acute distress. HEENT: normal. Neck: Supple, obese, difficult to gauge JVP.  No bruits or masses.  Cardiac: RRR, no murmurs, rubs, or gallops. No clubbing, cyanosis, edema.  Radials/PT 2+ and equal bilaterally.  Respiratory:  Respirations regular and unlabored, clear to auscultation bilaterally. GI: Obese, soft, nontender, nondistended, BS + x 4. MS: no deformity or atrophy. Skin: warm and dry, no rash. Neuro:  Strength and sensation are intact. Psych: Normal affect.  Accessory Clinical Findings   Lab Results  Component Value Date   WBC 7.3 08/06/2021   HGB 11.0 (L) 08/06/2021   HCT 34.9 08/06/2021   MCV 86 08/06/2021   PLT 366 08/06/2021   Lab Results  Component Value Date   CREATININE 1.57 (H) 05/28/2022   BUN 45 (H) 05/28/2022   NA 137 05/28/2022   K 5.3 (H) 05/28/2022   CL 109 (H) 05/28/2022   CO2 15 (L) 05/28/2022   Lab Results  Component Value Date   ALT 18 10/22/2021   AST 13  10/22/2021   GGT 487 (H) 06/03/2016   ALKPHOS 73 10/22/2021   BILITOT <0.2 10/22/2021   Lab Results  Component Value Date   CHOL 113 05/21/2022   HDL 47 05/21/2022   LDLCALC 52 05/21/2022   LDLDIRECT 163 (H) 12/17/2018   TRIG 65 05/21/2022   CHOLHDL 4.2 10/22/2021    Lab Results  Component Value Date   HGBA1C 9.0 12/15/2019    Assessment & Plan    1.  Chronic heart failure with improved ejection fraction/ischemic cardiomyopathy: EF 30 to 35% by echo in April 2016 that improved following PCI.  Most recent echo December 2019 showed an EF of 60 to 65% without regional wall motion abnormalities.  She was seen last week due to increasing weight gain, swelling, abdominal girth, and dyspnea on exertion.  I increased her Lasix to 40 mg twice daily with plan to follow-up today.  Today, she notes that she is feeling much better with resolution of edema and 4 pound weight loss.  Dyspnea has improved as well.  I will follow-up a basic metabolic panel today.  Echocardiogram is pending.  In the setting of chronic kidney disease, I have asked her to reduce her Lasix back to 40 mg once daily and she may use an additional 40 mg for weight gain of 2 pounds or greater in 24 hours or 5 pounds over the course of the week.  We discussed the importance of symptom reporting as well as dietary restriction of sodium and avoidance of fast  and processed foods.  Pending renal function and stability of renal function, may consider SGLT2 inhibitor and/or GLP-1 agonist in the future.  2.  Coronary artery disease: Status post prior stenting of the left circumflex and OM1 in April 2016 with PTCA of the left circumflex in December 2019, and drug-eluting stent placement to the RCA in February 2020.  Most recent catheterization in February 2022, showed 55% proximal restenosis in the left circumflex (PTCA site), but otherwise stable anatomy.  She has not been experiencing chest pain.  Dyspnea has improved with diuresis.  Left arm  tightening has resolved.  Will defer ischemic evaluation at this time.  Echo pending.  Continue beta-blocker, aspirin, Plavix, Zetia, Praluent, and Imdur.  3.  Essential hypertension: Blood pressure improved today at 134/68 on repeat check.  Continue beta-blocker and nitrate therapy.  4.  Stage III chronic kidney disease: Followed closely by nephrology.  Follow-up basic metabolic panel today in the setting of recent escalation of diuretic therapy.  As above, reducing Lasix back to 40 mg once daily with an additional dose to be used as needed for weight gain only.  5.  Hyperkalemia: On daily Lokelma.  Follow-up basic metabolic panel today.  6.  Hyperlipidemia: LDL of 52 on Praluent and Zetia earlier this year.  7.  Type 2 diabetes mellitus: Followed by endocrinology with an A1c of 7.6 in May.  She is on Water engineer.  Consider SGLT2 inhibitor and/or GLP-1 agonist in the future in the setting of above.  8.  Disposition: Follow-up echocardiogram.  Follow-up basic metabolic panel today.  Follow-up in clinic in 1 month or sooner if necessary.   Murray Hodgkins, NP 06/06/2022, 1:20 PM

## 2022-06-13 DIAGNOSIS — J301 Allergic rhinitis due to pollen: Secondary | ICD-10-CM | POA: Diagnosis not present

## 2022-06-14 ENCOUNTER — Encounter: Payer: Self-pay | Admitting: Oncology

## 2022-06-17 DIAGNOSIS — J301 Allergic rhinitis due to pollen: Secondary | ICD-10-CM | POA: Diagnosis not present

## 2022-06-18 ENCOUNTER — Other Ambulatory Visit
Admission: RE | Admit: 2022-06-18 | Discharge: 2022-06-18 | Disposition: A | Discharge status: Home / Self Care | Payer: Medicare (Managed Care) | Source: Ambulatory Visit | Attending: Nurse Practitioner | Admitting: Nurse Practitioner

## 2022-06-18 ENCOUNTER — Ambulatory Visit
Admission: RE | Admit: 2022-06-18 | Discharge: 2022-06-18 | Disposition: A | Discharge status: Home / Self Care | Payer: Medicare (Managed Care) | Source: Ambulatory Visit | Attending: Nurse Practitioner | Admitting: Nurse Practitioner

## 2022-06-18 DIAGNOSIS — I5032 Chronic diastolic (congestive) heart failure: Secondary | ICD-10-CM | POA: Diagnosis present

## 2022-06-18 DIAGNOSIS — I2510 Atherosclerotic heart disease of native coronary artery without angina pectoris: Secondary | ICD-10-CM

## 2022-06-18 DIAGNOSIS — N183 Chronic kidney disease, stage 3 unspecified: Secondary | ICD-10-CM

## 2022-06-18 LAB — ECHOCARDIOGRAM COMPLETE
AR max vel: 2.25 cm2
AV Area VTI: 2.43 cm2
AV Area mean vel: 2.29 cm2
AV Mean grad: 3 mmHg
AV Peak grad: 5.7 mmHg
Ao pk vel: 1.19 m/s
Area-P 1/2: 3.19 cm2
S' Lateral: 2.78 cm

## 2022-06-18 LAB — BASIC METABOLIC PANEL
Anion gap: 7 (ref 5–15)
BUN: 40 mg/dL — ABNORMAL HIGH (ref 6–20)
CO2: 20 mmol/L — ABNORMAL LOW (ref 22–32)
Calcium: 8.7 mg/dL — ABNORMAL LOW (ref 8.9–10.3)
Chloride: 109 mmol/L (ref 98–111)
Creatinine, Ser: 2.29 mg/dL — ABNORMAL HIGH (ref 0.44–1.00)
GFR, Estimated: 27 mL/min — ABNORMAL LOW (ref >60)
Glucose, Bld: 344 mg/dL — ABNORMAL HIGH (ref 70–99)
Potassium: 5.3 mmol/L — ABNORMAL HIGH (ref 3.5–5.1)
Sodium: 136 mmol/L (ref 135–145)

## 2022-06-19 ENCOUNTER — Telehealth: Payer: Self-pay

## 2022-06-19 ENCOUNTER — Encounter: Payer: Self-pay | Admitting: Oncology

## 2022-06-19 NOTE — Telephone Encounter (Signed)
Repatha may be covered but will require a PA

## 2022-06-19 NOTE — Telephone Encounter (Signed)
Yes, switch to Repatha 140 mg every 2 weeks.

## 2022-06-19 NOTE — Telephone Encounter (Signed)
Incoming fax from [place] [employer] ([employer]) was provided to the patient for a temporary supply for praluent.   The drug is either not included on the patients list of covered drugs or not included on formulary.

## 2022-06-20 ENCOUNTER — Other Ambulatory Visit: Payer: Self-pay | Admitting: *Deleted

## 2022-06-20 DIAGNOSIS — I2510 Atherosclerotic heart disease of native coronary artery without angina pectoris: Secondary | ICD-10-CM

## 2022-06-20 DIAGNOSIS — J301 Allergic rhinitis due to pollen: Secondary | ICD-10-CM | POA: Diagnosis not present

## 2022-06-20 DIAGNOSIS — N183 Chronic kidney disease, stage 3 unspecified: Secondary | ICD-10-CM

## 2022-06-20 DIAGNOSIS — I5042 Chronic combined systolic (congestive) and diastolic (congestive) heart failure: Secondary | ICD-10-CM

## 2022-06-20 DIAGNOSIS — E875 Hyperkalemia: Secondary | ICD-10-CM

## 2022-06-20 DIAGNOSIS — I5032 Chronic diastolic (congestive) heart failure: Secondary | ICD-10-CM

## 2022-06-20 MED ORDER — REPATHA SURECLICK 140 MG/ML ~~LOC~~ SOAJ
1.0000 | SUBCUTANEOUS | 5 refills | Status: DC
Start: 2022-06-20 — End: 2022-06-21

## 2022-06-20 NOTE — Telephone Encounter (Signed)
Rx for Repatha has been sent to the patients pharmacy. Pt made aware of the switch from Praluent to [place] since [place] is no longer covered by her insurance.

## 2022-06-21 MED ORDER — PRALUENT 150 MG/ML ~~LOC~~ SOAJ
1.0000 mL | SUBCUTANEOUS | 1 refills | Status: DC
Start: 2022-06-21 — End: 2023-04-18

## 2022-06-21 NOTE — Telephone Encounter (Signed)
Per fax received from [facility], patient has been provided with a temporary supply of Repatha because this drug is not on the list of covered (Formulary) drugs.  Alternative Drug names: Praluent inj 150 mg/ml (Tier 1) and rosuvastatin 40 mg (Tier 1)  Called to verify this with CSR because misinformation was given 2 days ago regarding covered drug. He states Praluent is on the patient's formulary but will still require prior authorization.   PA initiated through [email or web address] KEY: [id number]  Response: This request has received a Favorable outcome. Approved. This drug has been approved under the Member's Medicare Part D benefit. Approved quantity: 2 units per 30 day(s). You may fill up to a 90 day supply except for those on Specialty Tier 5, which can be filled up to a 30 day supply. Please call the pharmacy to process the prescription claim.

## 2022-06-21 NOTE — Addendum Note (Signed)
Addended by: Raelene Bott, Cathline Dowen L on: 06/21/2022 01:07 PM   Modules accepted: Orders

## 2022-06-24 DIAGNOSIS — J301 Allergic rhinitis due to pollen: Secondary | ICD-10-CM | POA: Diagnosis not present

## 2022-06-26 ENCOUNTER — Other Ambulatory Visit: Payer: Medicare (Managed Care)

## 2022-06-26 DIAGNOSIS — M109 Gout, unspecified: Secondary | ICD-10-CM

## 2022-06-27 LAB — URIC ACID: Uric Acid: 7.4 mg/dL — ABNORMAL HIGH (ref 2.6–6.2)

## 2022-06-27 NOTE — Progress Notes (Signed)
Contacted via MyChart   Good morning Madyson, your uric acid level continues to be elevated.  How is your pain?  I would continue current Allopurinol dosing at this time and see if kidney doctor feels would could increase slightly at your next visit with them, as it is renal dosed.  Any questions?  Focus heavily on a low purine diet at home to help lower levels. Keep being stellar!!  Thank you for allowing me to participate in your care.  I appreciate you. Kindest regards, Julyssa Kyer

## 2022-07-04 DIAGNOSIS — E875 Hyperkalemia: Secondary | ICD-10-CM | POA: Diagnosis not present

## 2022-07-04 DIAGNOSIS — I10 Essential (primary) hypertension: Secondary | ICD-10-CM | POA: Diagnosis not present

## 2022-07-04 DIAGNOSIS — N184 Chronic kidney disease, stage 4 (severe): Secondary | ICD-10-CM | POA: Diagnosis not present

## 2022-07-04 DIAGNOSIS — E1122 Type 2 diabetes mellitus with diabetic chronic kidney disease: Secondary | ICD-10-CM | POA: Diagnosis not present

## 2022-07-04 DIAGNOSIS — R600 Localized edema: Secondary | ICD-10-CM | POA: Diagnosis not present

## 2022-07-04 DIAGNOSIS — R801 Persistent proteinuria, unspecified: Secondary | ICD-10-CM | POA: Diagnosis not present

## 2022-07-16 HISTORY — PX: EYE SURGERY: SHX253

## 2022-08-06 ENCOUNTER — Ambulatory Visit: Payer: Medicare (Managed Care) | Attending: Nurse Practitioner | Admitting: Nurse Practitioner

## 2022-08-06 ENCOUNTER — Other Ambulatory Visit
Admission: RE | Admit: 2022-08-06 | Discharge: 2022-08-06 | Disposition: A | Discharge status: Home / Self Care | Payer: Medicare (Managed Care) | Source: Ambulatory Visit | Attending: Nurse Practitioner | Admitting: Nurse Practitioner

## 2022-08-06 ENCOUNTER — Encounter: Payer: Self-pay | Admitting: Nurse Practitioner

## 2022-08-06 ENCOUNTER — Encounter: Payer: Self-pay | Admitting: Oncology

## 2022-08-06 VITALS — BP 150/78 | HR 86 | Ht 65.0 in | Wt 281.6 lb

## 2022-08-06 DIAGNOSIS — N183 Chronic kidney disease, stage 3 unspecified: Secondary | ICD-10-CM

## 2022-08-06 DIAGNOSIS — I2510 Atherosclerotic heart disease of native coronary artery without angina pectoris: Secondary | ICD-10-CM

## 2022-08-06 DIAGNOSIS — I5032 Chronic diastolic (congestive) heart failure: Secondary | ICD-10-CM | POA: Diagnosis not present

## 2022-08-06 DIAGNOSIS — I10 Essential (primary) hypertension: Secondary | ICD-10-CM | POA: Diagnosis not present

## 2022-08-06 DIAGNOSIS — Z794 Long term (current) use of insulin: Secondary | ICD-10-CM

## 2022-08-06 DIAGNOSIS — E1165 Type 2 diabetes mellitus with hyperglycemia: Secondary | ICD-10-CM

## 2022-08-06 DIAGNOSIS — E785 Hyperlipidemia, unspecified: Secondary | ICD-10-CM | POA: Diagnosis not present

## 2022-08-06 DIAGNOSIS — E875 Hyperkalemia: Secondary | ICD-10-CM

## 2022-08-06 LAB — BASIC METABOLIC PANEL
Anion gap: 3 — ABNORMAL LOW (ref 5–15)
BUN: 46 mg/dL — ABNORMAL HIGH (ref 6–20)
CO2: 23 mmol/L (ref 22–32)
Calcium: 9.2 mg/dL (ref 8.9–10.3)
Chloride: 112 mmol/L — ABNORMAL HIGH (ref 98–111)
Creatinine, Ser: 1.85 mg/dL — ABNORMAL HIGH (ref 0.44–1.00)
GFR, Estimated: 34 mL/min — ABNORMAL LOW (ref >60)
Glucose, Bld: 120 mg/dL — ABNORMAL HIGH (ref 70–99)
Potassium: 6.2 mmol/L — ABNORMAL HIGH (ref 3.5–5.1)
Sodium: 138 mmol/L (ref 135–145)

## 2022-08-06 NOTE — Progress Notes (Signed)
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  Past Medical History    Past Medical History:  Diagnosis Date   CAD (coronary artery disease)    a. 03/2015 NSTEMI/PCI: LCX 135m(DES), OM1 100p (DES - vessel labeled Ramus in subsequent cath report); b. 04/2015 Cath: patent stents/stable anatomy; c. 02/2016 MV: No isch; d. 11/2018 PCI: LCX 95p/m (PTCA->20%); e. 01/2019 PCI: RCA 85p/m (2.5x32 Synergy DES); f. 01/2021 Cath: LM 30, LAD 40ost/p, 593m, LCX 55p restenosis, OM1 10, OM2 40, RCA patent stent-->Med rx.   Chronic abdominal pain    Chronic heart failure with improved ejection fraction (HFimpEF) ([facility]   a. 03/2015 Echo: EF 30-35%, mild concentric LVH, severe HK of inf, inflat, & lat walls, mod MR, mild TR;  b. 04/2015 LV Gram: EF 55-65%; c. 09/2017 Echo: EF 55-60%, no rwma. Mild MR; d. 11/2018 Echo: Ef 60-65%, no rwma; e. 06/2022 Echo: EF 60-65%, nl RV fxn, mildly dil LA, mild MR.   CKD (chronic kidney disease), stage III ([facility]   Diabetes type 2, controlled ([facility]   a. since 2002    Diabetic gastroparesis (HCC)    a. chronic nausea/vomiting.   Diabetic retinopathy ([facility]   Diverticulosis, sigmoid 07/2016   Fatty liver    GERD (gastroesophageal reflux disease)    Heavy menses    a. H/O IUD - expired in 2014 - remains in place.   Hx of migraines    Hyperlipidemia    Intolerant of atorvastatin, rosuvastatin   Hypertension    Iron deficiency anemia    Ischemic cardiomyopathy (resolved)    a. 03/2015 EF 30-35% post NSTEMI;  b. 04/2015 EF  55-65% on LV gram; c. 09/2017 Echo: EF 55-60%; d. 11/2018 Echo: Ef 60-65%; e. 06/2022 Echo: EF 60-65%.   Obesity    Tobacco abuse    a. quit 03/2015.   Uterine fibroid    Vertigo    Vitamin D deficiency    Past Surgical History:  Procedure Laterality Date   APPENDECTOMY     CARDIAC CATHETERIZATION  03/2015   x2 stent [place]/A 05/05/2015   Procedure: Left Heart Cath and Coronary Angiography;  Surgeon: Peter M JoMartiniqueMD;  [facility];  Service: Cardiovascular;  Laterality: N/A;   CATARACT EXTRACTION Left 09/12/2021   CATARACT EXTRACTION Right 10/10/2021   COLONOSCOPY WITH PROPOFOL N/A 07/31/2016   Procedure: COLONOSCOPY WITH PROPOFOL;  Surgeon: MaLollie SailsMD;  [facility];  Service: Endoscopy;  Laterality: N/A;   CORONARY BALLOON ANGIOPLASTY N/A 12/04/2018   Procedure: CORONARY BALLOON ANGIOPLASTY;  Surgeon: ArWellington HampshireMD;  [facility];  Service: Cardiovascular;  Laterality: N/A;   CORONARY STENT INTERVENTION N/A 01/12/2019   Procedure: CORONARY STENT INTERVENTION;  Surgeon: EnNelva BushMD;  [facility];  Service: Cardiovascular;  Laterality: N/A;   ESOPHAGOGASTRODUODENOSCOPY (EGD) WITH PROPOFOL N/A 07/31/2016   Procedure: ESOPHAGOGASTRODUODENOSCOPY (EGD) WITH PROPOFOL;  Surgeon: MaLollie SailsMD;  [facility];  Service: Endoscopy;  Laterality: N/A;   EYE SURGERY  07/16/2022  Membrane peeled   INTRAVASCULAR PRESSURE WIRE/FFR STUDY N/A 12/04/2018   Procedure: INTRAVASCULAR PRESSURE WIRE/FFR STUDY;  Surgeon: Wellington Hampshire, MD;  Location: [place] [facility];  Service: Cardiovascular;  Laterality: N/A;   LAPAROSCOPIC APPENDECTOMY N/A 08/07/2016   Procedure: APPENDECTOMY LAPAROSCOPIC;  Surgeon: Hubbard Robinson, MD;  Location: [facility];  Service: General;  Laterality: N/A;   LEFT HEART CATH AND CORONARY ANGIOGRAPHY Left 12/04/2018   Procedure: LEFT  HEART CATH AND CORONARY ANGIOGRAPHY;  Surgeon: Wellington Hampshire, MD;  Location: [place] [facility];  Service: Cardiovascular;  Laterality: Left;   LEFT HEART CATH AND CORONARY ANGIOGRAPHY N/A 01/12/2019   Procedure: LEFT HEART CATH AND CORONARY ANGIOGRAPHY;  Surgeon: [name], MD;  Location: [place] [facility];  Service: Cardiovascular;  Laterality: N/A;   MOUTH SURGERY     RIGHT/LEFT HEART CATH AND CORONARY ANGIOGRAPHY N/A 01/29/2021   Procedure: RIGHT/LEFT HEART CATH AND CORONARY ANGIOGRAPHY;  Surgeon: Wellington Hampshire, MD;  Location: [place] [facility];  Service: Cardiovascular;  Laterality: N/A;   VITRECTOMY Left 10/30/2021    Allergies  Allergies  Allergen Reactions   Lisinopril Anaphylaxis, Itching and Swelling   Other Itching and Swelling    [place] seasoning    Rosemary Oil Anaphylaxis   Shellfish Allergy Itching and Swelling    Pt states that she HAS had IV Contrast without any reaction   Statins Other (See Comments)    Myalgias   Metformin And Related Diarrhea, Nausea And Vomiting and Rash    History of Present Illness    41 year old female with the above past medical history including CAD status post multiple interventions, diabetes, hypertension, hyperlipidemia, statin intolerance, ischemic cardiomyopathy, 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  Cardiac history dates back to April 2016, when she was hospitalized with non-STEMI.  She was found to have LV dysfunction with an EF of 30 to 35%.  Diagnostic catheterization showed severe left circumflex and OM1 disease.  Both areas were successfully treated with drug-eluting stents.  Subsequent catheterization in May 2016, showed patency of both stents and normalization of LV function by ventriculography.  She had nonischemic stress testing in March 2017, but in the setting of recurrent angina in December 2019, she underwent diagnostic  catheterization, which showed patent OM1 stent with focal in-stent restenosis in the left circumflex at the origin of the OM 2, and new significant stenosis in the proximal to mid right coronary artery.  Balloon angioplasty was performed in the mid left circumflex and she subsequently underwent staged PCI of the RCA in early 2020.  Echo in December 2019, showed an EF of 60 to 65%.  In February 2022, she underwent right and left heart cardiac catheterization in the setting of chest pain and dyspnea.  This showed patent stents in the RCA, left circumflex, and OM1.  There was moderate restenosis in the mid left circumflex at the site of prior PTCA.  She was medically managed.  In June 2023, Ms. Grimm was evaluated in the setting of 14 pound weight gain with increasing swelling of her hands, face, and lower extremities, as well as an increase in abdominal girth.  She was hypertensive.  Lasix was increased to 40 mg twice daily with improvement in symptoms and weight loss reported June 29 follow-up.  Creatinine did increase to 2.34 on twice daily Lasix, and this was reduced back to 40 mg daily with an additional 40 mg daily as needed.  Creatinine improved slightly to 2.29 by July 11.  Echocardiogram in July showed an EF of 60 to 65% with normal RV function, mildly dilated left atrium, and mild mitral regurgitation.  Since her last visit, she has felt well.  She has noted some fluctuation in weight, ranging from 278 to 281 pounds.  Despite this, she has not noticed any worsening in dyspnea or swelling.  She has not required any additional Lasix on top of her usual daily dose.  She denies chest pain, dyspnea, palpitations, PND, orthopnea, dizziness, syncope, edema, or early satiety.  She has been changing up her diet and increasing her vegetable intake in hopes of losing weight and improving her glucose control.  Home Medications    Current Outpatient Medications  Medication Sig Dispense Refill   ACCU-CHEK AVIVA  PLUS test strip USE 1 STRIP TO CHECK GLUCOSE THREE TIMES DAILY.  DX E11.29 100 each 12   acetaminophen (TYLENOL) 500 MG tablet Take 1,000 mg by mouth every 4 (four) hours as needed.     Alirocumab (PRALUENT) 150 MG/ML SOAJ Inject 1 mL into the skin every 14 (fourteen) days. 2 mL 1   allopurinol (ZYLOPRIM) 100 MG tablet Take 0.5 tablets (50 mg total) by mouth daily. 45 tablet 4   aspirin EC 81 MG tablet Take 81 mg by mouth daily.     BD PEN NEEDLE MICRO U/F 32G X 6 MM MISC Inject into the skin.     carvedilol (COREG) 12.5 MG tablet Take 1 tablet (12.5 mg total) by mouth 2 (two) times daily. 180 tablet 1   clopidogrel (PLAVIX) 75 MG tablet Take 1 tablet (75 mg total) by mouth daily. 90 tablet 3   Continuous Blood Gluc Receiver (DEXCOM G6 RECEIVER) DEVI      Continuous Blood Gluc Sensor (DEXCOM G6 SENSOR) MISC See admin instructions.     Continuous Blood Gluc Transmit (DEXCOM G6 TRANSMITTER) MISC      EPINEPHrine 0.3 mg/0.3 mL IJ SOAJ injection See admin instructions.     EQ ALLERGY RELIEF, CETIRIZINE, 10 MG tablet Take 10 mg by mouth daily as needed.     etonogestrel (NEXPLANON) 68 MG IMPL implant 1 each (68 mg total) by Subdermal route once for 1 dose. 1 each 0   ezetimibe (ZETIA) 10 MG tablet Take 1 tablet (10 mg total) by mouth daily. 90 tablet 3   famotidine (PEPCID) 40 MG tablet Take 1 tablet (40 mg total) by mouth daily. 90 tablet 4   Fluticasone Propionate (XHANCE) 93 MCG/ACT EXHU daily.     furosemide (LASIX) 40 MG tablet Take 1 tablet (40 mg total) by mouth as directed. Take 1 tablet daily and may take an extra 40 mg daily as needed for weight gain of 2 pounds or more in 24 hours. 180 tablet 3   insulin aspart (NOVOLOG) 100 UNIT/ML injection Inject 10 Units into the skin 3 (three) times daily with meals. 10 mL 11   Insulin Glargine (BASAGLAR KWIKPEN) 100 UNIT/ML Inject 70 Units into the skin daily.     isosorbide mononitrate (IMDUR) 120 MG 24 hr tablet Take 1 tablet (120 mg total) by mouth  daily. 90 tablet 3   LOKELMA 10 g PACK packet Take 10 g by mouth daily.     loperamide (IMODIUM) 2 MG capsule Take 2 mg by mouth as needed for diarrhea or loose stools.     meclizine (ANTIVERT) 25 MG tablet Take 25 mg by mouth 3 (three) times daily as needed for dizziness.  nitroGLYCERIN (NITROSTAT) 0.4 MG SL tablet Take 1 tab under your tongue while sitting for chest pain, If no relief may repeat, one tab every 5 min up to 3 tabs total over 15 mins. 25 tablet 3   pantoprazole (PROTONIX) 40 MG tablet Take 1 tablet (40 mg total) by mouth 2 (two) times daily. 180 tablet 4   promethazine (PHENERGAN) 12.5 MG tablet TAKE 1 TABLET BY MOUTH EVERY 6 HOURS AS NEEDED FOR NAUSEA AND VOMITING 30 tablet 2   No current facility-administered medications for this visit.   Facility-Administered Medications Ordered in Other Visits  Medication Dose Route Frequency Provider Last Rate Last Admin   sodium chloride flush (NS) 0.9 % injection 3 mL  3 mL Intravenous Q12H Visser, Jacquelyn D, PA-C         Review of Systems    She denies chest pain, palpitations, dyspnea, pnd, orthopnea, n, v, dizziness, syncope, edema, weight gain, or early satiety. .  All other systems reviewed and are otherwise negative except as noted above.    Physical Exam    VS:  BP (!) 144/72 (BP Location: Left Arm, Patient Position: Sitting, Cuff Size: Large)   Pulse 86   Ht '5\' 5"'$  (1.651 m)   Wt 281 lb 9.6 oz (127.7 kg)   SpO2 98%   BMI 46.86 kg/m  , BMI Body mass index is 46.86 kg/m.     Vitals:   08/06/22 0926 08/06/22 0953  BP: (!) 144/72 (!) 150/78  Pulse: 86   SpO2: 98%     GEN: Well nourished, well developed, in no acute distress. HEENT: normal. Neck: Supple, no JVD, carotid bruits, or masses. Cardiac: RRR, no murmurs, rubs, or gallops. No clubbing, cyanosis, edema.  Radials/PT 2+ and equal bilaterally.  Respiratory:  Respirations regular and unlabored, clear to auscultation bilaterally. GI: Soft, nontender,  nondistended, BS + x 4. MS: no deformity or atrophy. Skin: warm and dry, no rash. Neuro:  Strength and sensation are intact. Psych: Normal affect.  Accessory Clinical Findings     Lab Results  Component Value Date   WBC 7.3 08/06/2021   HGB 11.0 (L) 08/06/2021   HCT 34.9 08/06/2021   MCV 86 08/06/2021   PLT 366 08/06/2021   Lab Results  Component Value Date   CREATININE 2.29 (H) 06/18/2022   BUN 40 (H) 06/18/2022   NA 136 06/18/2022   K 5.3 (H) 06/18/2022   CL 109 06/18/2022   CO2 20 (L) 06/18/2022   Lab Results  Component Value Date   ALT 18 10/22/2021   AST 13 10/22/2021   GGT 487 (H) 06/03/2016   ALKPHOS 73 10/22/2021   BILITOT <0.2 10/22/2021   Lab Results  Component Value Date   CHOL 113 05/21/2022   HDL 47 05/21/2022   LDLCALC 52 05/21/2022   LDLDIRECT 163 (H) 12/17/2018   TRIG 65 05/21/2022   CHOLHDL 4.2 10/22/2021    Lab Results  Component Value Date   HGBA1C 9.0 12/15/2019    Assessment & Plan    1.  Chronic heart failure with improved ejection fraction/ischemic cardiomyopathy: EF 30 to 35% by echo in April 2016 that improved following PCI.  She was recently seen in June with weight gain, swelling, and dyspnea.  This improved following doubling of Lasix though we have been able to transition back to once daily dosing.  Echo in July showed an EF of 60 to 65% with normal RV function and mild MR.  Her weight is fluctuated back-and-forth  by about 3 pounds at home though, she has not experience any worsening of swelling and notes good activity tolerance without dyspnea.  She is euvolemic on examination today.  As creatinine was still elevated above baseline when last checked in July, I will follow-up today.  She remains on beta-blocker and nitrate therapy.  No ACE/ARB/arni/spriro in the setting of history of hyperkalemia.  With recent findings in the STEP-HFPEF trial she would likely greatly benefit from addition of a GLP-1 agonist if not cost prohibitive.  2.   Coronary artery disease: Status post prior stenting of the left circumflex and OM1 in April 2016, with PTCA of the left circumflex in December 2019, and drug-eluting stent placement to the RCA in February 2020.  Most recent catheterization in February 2022, showed 55% proximal restenosis in the left circumflex (PTCA site), but otherwise stable anatomy.  She has not been experiencing any chest pain and dyspnea improved following recent diuresis.  Normal EF by recent echo.  Continue beta-blocker, aspirin, Plavix, Zetia, Praluent, and Imdur.  3.  Essential hypertension: Blood pressure elevated today at 144/72 and then 150/78.  She notes that at home, pressures are typically in the low 100s though she was 134/72 this morning on her home monitor.  She complains of a headache following recent eye surgery, and it has been throbbing this morning, which she thinks is may be driving up her blood pressure.  We agreed that she will follow her blood pressure closely at home over the next week and contact us if systolics are consistently over 130, at which time we can consider increasing carvedilol to 18.75 mg twice daily.  4.  Stage III chronic kidney disease: Creatinine was 2.29 with a BUN of 40 and July.  At that time, she was advised to have follow-up basic metabolic panel in 1 week, though she has yet to have this drawn.  We will evaluate today.  5.  Hyperkalemia: Potassium 5.3 in July.  Follow-up basic metabolic panel today.  She supposed to be taking daily Lokelma but notes that she often forgets this.  6.  Hyperlipidemia: LDL of 52 on Praluent and Zetia earlier this year.  7.  Type 2 diabetes mellitus: A1c 7.6 in May.  She is followed by endocrinology.  She notes that she recently added more vegetables to her diet to try and improve her glucose control.  If financially feasible, she would greatly benefit from the addition of a GLP-1 agonist.  Renal function may be prohibitive for SGLT2 inhibitor.  8.   Disposition: Follow-up basic metabolic panel today.  Patient will follow blood pressure closely over the next few weeks and contact us.  Follow-up in clinic in 3 months or sooner if necessary.  Murray Hodgkins, NP 08/06/2022, 9:38 AM

## 2022-08-06 NOTE — Patient Instructions (Addendum)
Medication Instructions:   Your physician recommends that you continue on your current medications as directed. Please refer to the Current Medication list given to you today.  *If you need a refill on your cardiac medications before your next appointment, please call your pharmacy*   Lab Work: BMET TODAY   If you have labs (blood work) drawn today and your tests are completely normal, you will receive your results only by: [place] (if you have MyChart) OR A paper copy in the mail If you have any lab test that is abnormal or we need to change your treatment, we will call you to review the results.   Testing/Procedures: NONE ORDERED  TODAY    Follow-Up: At [facility], you and your health needs are our priority.  As part of our continuing mission to provide you with exceptional heart care, we have created designated Provider Care Teams.  These Care Teams include your primary Cardiologist (physician) and Advanced Practice Providers (APPs -  Physician Assistants and Nurse Practitioners) who all work together to provide you with the care you need, when you need it.  We recommend signing up for the patient portal called "MyChart".  Sign up information is provided on this After Visit Summary.  MyChart is used to connect with patients for Virtual Visits (Telemedicine).  Patients are able to view lab/test results, encounter notes, upcoming appointments, etc.  Non-urgent messages can be sent to your provider as well.   To learn more about what you can do with MyChart, go to [email or web address].    Your next appointment:   3-4 month(s)  The format for your next appointment:   In Person  Provider:   You may see Kathlyn Sacramento, MD or one of the following Advanced Practice Providers on your designated Care Team:   Murray Hodgkins, NP Other Instructions   Important Information About Sugar

## 2022-08-08 ENCOUNTER — Telehealth: Payer: Self-pay

## 2022-08-08 DIAGNOSIS — I5032 Chronic diastolic (congestive) heart failure: Secondary | ICD-10-CM

## 2022-08-08 NOTE — Telephone Encounter (Signed)
-----   Message from Emily Filbert, RN sent at 08/08/2022 12:09 PM EDT -----  ----- Message ----- From: Theora Gianotti, NP Sent: 08/06/2022  12:52 PM EDT To: Valora Corporal, RN  Potassium is elevated at 6.2.  She needs to resume lokelma 10g daily and take 10g three times today.  She will need a f/u bmet in the AM. Kidney function is stable.

## 2022-08-08 NOTE — Telephone Encounter (Signed)
Called patient and informed her of the recommendation below. Patient verbalized understanding, stated that she has Lokelma on hand. She will get the lab drawn tomorrow.

## 2022-08-20 ENCOUNTER — Encounter: Payer: Self-pay | Admitting: Nurse Practitioner

## 2022-08-20 MED ORDER — BENZONATATE 100 MG PO CAPS: 100.0000 mg | ORAL_CAPSULE | Freq: Three times a day (TID) | ORAL | 0 refills | Status: DC | PRN

## 2022-08-26 ENCOUNTER — Other Ambulatory Visit: Payer: Self-pay | Admitting: Cardiovascular Disease

## 2022-09-12 ENCOUNTER — Encounter: Payer: Self-pay | Admitting: Nurse Practitioner

## 2022-09-26 ENCOUNTER — Ambulatory Visit: Payer: Medicare (Managed Care)

## 2022-09-27 ENCOUNTER — Ambulatory Visit: Payer: Medicare Other

## 2022-11-06 ENCOUNTER — Encounter: Payer: Self-pay | Admitting: Nurse Practitioner

## 2022-11-06 ENCOUNTER — Ambulatory Visit: Payer: Medicare (Managed Care) | Attending: Nurse Practitioner | Admitting: Nurse Practitioner

## 2022-11-06 NOTE — Progress Notes (Deleted)
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  Past Medical History    Past Medical History:  Diagnosis Date   CAD (coronary artery disease)    a. 03/2015 NSTEMI/PCI: LCX 115m(DES), OM1 100p (DES - vessel labeled Ramus in subsequent cath report); b. 04/2015 Cath: patent stents/stable anatomy; c. 02/2016 MV: No isch; d. 11/2018 PCI: LCX 95p/m (PTCA->20%); e. 01/2019 PCI: RCA 85p/m (2.5x32 Synergy DES); f. 01/2021 Cath: LM 30, LAD 40ost/p, 5102m, LCX 55p restenosis, OM1 10, OM2 40, RCA patent stent-->Med rx.   Chronic abdominal pain    Chronic heart failure with improved ejection fraction (HFimpEF) ([facility]   a. 03/2015 Echo: EF 30-35%, mild concentric LVH, severe HK of inf, inflat, & lat walls, mod MR, mild TR;  b. 04/2015 LV Gram: EF 55-65%; c. 09/2017 Echo: EF 55-60%, no rwma. Mild MR; d. 11/2018 Echo: Ef 60-65%, no rwma; e. 06/2022 Echo: EF 60-65%, nl RV fxn, mildly dil LA, mild MR.   CKD (chronic kidney disease), stage IV ([facility]   Diabetes type 2, controlled ([facility]   a. since 2002    Diabetic gastroparesis (HCC)    a. chronic nausea/vomiting.   Diabetic retinopathy ([facility]   Diverticulosis, sigmoid 07/2016   Fatty liver    GERD (gastroesophageal reflux disease)    Heavy menses    a. H/O IUD - expired in 2014 - remains in place.   Hx of migraines    Hyperkalemia    a. On chronic lokelma - managed by nephrology.   Hyperlipidemia    Intolerant of atorvastatin, rosuvastatin   Hypertension    Iron deficiency anemia    Ischemic cardiomyopathy  (resolved)    a. 03/2015 EF 30-35% post NSTEMI;  b. 04/2015 EF 55-65% on LV gram; c. 09/2017 Echo: EF 55-60%; d. 11/2018 Echo: Ef 60-65%; e. 06/2022 Echo: EF 60-65%.   Obesity    Tobacco abuse    a. quit 03/2015.   Uterine fibroid    Vertigo    Vitamin D deficiency    Past Surgical History:  Procedure Laterality Date   APPENDECTOMY     CARDIAC CATHETERIZATION  03/2015   x2 stent [place]/A 05/05/2015   Procedure: Left Heart Cath and Coronary Angiography;  Surgeon: Peter M JoMartiniqueMD;  [facility];  Service: Cardiovascular;  Laterality: N/A;   CATARACT EXTRACTION Left 09/12/2021   CATARACT EXTRACTION Right 10/10/2021   COLONOSCOPY WITH PROPOFOL N/A 07/31/2016   Procedure: COLONOSCOPY WITH PROPOFOL;  Surgeon: MaLollie SailsMD;  [facility];  Service: Endoscopy;  Laterality: N/A;   CORONARY BALLOON ANGIOPLASTY N/A 12/04/2018   Procedure: CORONARY BALLOON ANGIOPLASTY;  Surgeon: ArWellington HampshireMD;  [facility];  Service: Cardiovascular;  Laterality: N/A;   CORONARY STENT INTERVENTION N/A 01/12/2019   Procedure: CORONARY STENT INTERVENTION;  Surgeon: EnNelva BushMD;  [facility];  Service: Cardiovascular;  Laterality: N/A;   ESOPHAGOGASTRODUODENOSCOPY (EGD) WITH PROPOFOL N/A 07/31/2016   Procedure: ESOPHAGOGASTRODUODENOSCOPY (EGD) WITH PROPOFOL;  Surgeon: MaLollie SailsMD;  Location: [facility]COPY;  Service: Endoscopy;  Laterality: N/A;   EYE SURGERY  07/16/2022   Membrane peeled   INTRAVASCULAR PRESSURE WIRE/FFR STUDY N/A 12/04/2018   Procedure: INTRAVASCULAR PRESSURE WIRE/FFR STUDY;  Surgeon: Wellington Hampshire, MD;  Location: [place] [facility];  Service: Cardiovascular;  Laterality: N/A;   LAPAROSCOPIC APPENDECTOMY N/A 08/07/2016   Procedure: APPENDECTOMY LAPAROSCOPIC;  Surgeon: Hubbard Robinson, MD;  Location: [facility];  Service: General;  Laterality: N/A;   LEFT HEART CATH  AND CORONARY ANGIOGRAPHY Left 12/04/2018   Procedure: LEFT HEART CATH AND CORONARY ANGIOGRAPHY;  Surgeon: Wellington Hampshire, MD;  Location: [place] [facility];  Service: Cardiovascular;  Laterality: Left;   LEFT HEART CATH AND CORONARY ANGIOGRAPHY N/A 01/12/2019   Procedure: LEFT HEART CATH AND CORONARY ANGIOGRAPHY;  Surgeon: [name], MD;  Location: [place] [facility];  Service: Cardiovascular;  Laterality: N/A;   MOUTH SURGERY     RIGHT/LEFT HEART CATH AND CORONARY ANGIOGRAPHY N/A 01/29/2021   Procedure: RIGHT/LEFT HEART CATH AND CORONARY ANGIOGRAPHY;  Surgeon: Wellington Hampshire, MD;  Location: [place] [facility];  Service: Cardiovascular;  Laterality: N/A;   VITRECTOMY Left 10/30/2021    Allergies  Allergies  Allergen Reactions   Lisinopril Anaphylaxis, Itching and Swelling   Other Itching and Swelling    [place] seasoning    Rosemary Oil Anaphylaxis   Shellfish Allergy Itching and Swelling    Pt states that she HAS had IV Contrast without any reaction   Statins Other (See Comments)    Myalgias   Metformin And Related Diarrhea, Nausea And Vomiting and Rash    History of Present Illness    41 year old female with above past medical history including CAD status post multiple interventions, diabetes, hypertension, hyperlipidemia, statin intolerance, ischemic cardiomyopathy, 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  Cardiac history dates back to April 2016, when she was hospitalized with a non-STEMI.  She was found to have LV dysfunction with an EF of 30 to 35%.  Diagnostic catheterization showed severe left circumflex and OM1 disease in both areas were successfully treated with drug-eluting stents.  Subsequent catheterization in May 2016 showed patency of both stents with normalization of LV function by ventriculography.  She had nonischemic stress testing March 2017 but in the setting of  recurrent angina in December 2019, she underwent diagnostic catheterization, which showed patent OM1 stent with focal in-stent restenosis in the left circumflex at the origin of the OM 2, and new significant stenosis in the proximal to mid right coronary artery.  Balloon angioplasty was performed in the left circumflex and she subsequently underwent staged PCI of the RCA in early 2020.  Echo in December 2019, showed an EF of 60 to 65%.  In February 2022, she underwent right and left heart cardiac catheterization the setting of chest pain and dyspnea.  This showed patent stents in the RCA, left circumflex, and OM1.  There was moderate restenosis in the mid left circumflex at the site of the prior PTCA.  She has since been medically managed.  In June 2023, Ms. Melendrez was evaluated in the setting of 14 pound weight gain and swelling.  Lasix was increased to 40 mg twice daily with improvement in symptoms and subsequent weight loss.  Lasix was reduced back to 40 mg daily in the setting of rising creatinine to 2.34 with slight improvement in creatinine to 2.29 by labs in July.  Echo in July  2023 showed an EF of 60 to 65% with normal RV function, mildly dilated left atrium, and mild MR.  Ms. Dandy was last seen in cardiology clinic in August 2023 at which time she was feeling well and was felt to be euvolemic.  Follow-up labs showed a stable creatinine of 1.85.  She has since had follow-up lab work with nephrology in October, revealing a potassium of 6.4 with repeat down to 5.5.  Creatinine was 2.9.  She reported at nephrology office visit November 21, that she sometimes runs out of her Lokelma related to insurance issues.  Nephrology notes indicate that she is also been taking low-dose irbesartan.  Home Medications    Current Outpatient Medications  Medication Sig Dispense Refill   ACCU-CHEK AVIVA PLUS test strip USE 1 STRIP TO CHECK GLUCOSE THREE TIMES DAILY.  DX E11.29 100 each 12   acetaminophen (TYLENOL)  500 MG tablet Take 1,000 mg by mouth every 4 (four) hours as needed.     Alirocumab (PRALUENT) 150 MG/ML SOAJ Inject 1 mL into the skin every 14 (fourteen) days. 2 mL 1   allopurinol (ZYLOPRIM) 100 MG tablet Take 0.5 tablets (50 mg total) by mouth daily. 45 tablet 4   aspirin EC 81 MG tablet Take 81 mg by mouth daily.     BD PEN NEEDLE MICRO U/F 32G X 6 MM MISC Inject into the skin.     benzonatate (TESSALON PERLES) 100 MG capsule Take 1 capsule (100 mg total) by mouth 3 (three) times daily as needed for cough. 45 capsule 0   carvedilol (COREG) 12.5 MG tablet Take 1 tablet by mouth twice daily 180 tablet 0   clopidogrel (PLAVIX) 75 MG tablet Take 1 tablet (75 mg total) by mouth daily. 90 tablet 3   Continuous Blood Gluc Receiver (DEXCOM G6 RECEIVER) DEVI      Continuous Blood Gluc Sensor (DEXCOM G6 SENSOR) MISC See admin instructions.     Continuous Blood Gluc Transmit (DEXCOM G6 TRANSMITTER) MISC      EPINEPHrine 0.3 mg/0.3 mL IJ SOAJ injection See admin instructions.     EQ ALLERGY RELIEF, CETIRIZINE, 10 MG tablet Take 10 mg by mouth daily as needed.     etonogestrel (NEXPLANON) 68 MG IMPL implant 1 each (68 mg total) by Subdermal route once for 1 dose. 1 each 0   ezetimibe (ZETIA) 10 MG tablet Take 1 tablet (10 mg total) by mouth daily. 90 tablet 3   famotidine (PEPCID) 40 MG tablet Take 1 tablet (40 mg total) by mouth daily. 90 tablet 4   Fluticasone Propionate (XHANCE) 93 MCG/ACT EXHU daily.     furosemide (LASIX) 40 MG tablet Take 1 tablet (40 mg total) by mouth as directed. Take 1 tablet daily and may take an extra 40 mg daily as needed for weight gain of 2 pounds or more in 24 hours. 180 tablet 3   insulin aspart (NOVOLOG) 100 UNIT/ML injection Inject 10 Units into the skin 3 (three) times daily with meals. 10 mL 11   Insulin Glargine (BASAGLAR KWIKPEN) 100 UNIT/ML Inject 70 Units into the skin daily.     isosorbide mononitrate (IMDUR) 120 MG 24 hr tablet Take 1 tablet (120 mg total) by  mouth daily. 90 tablet 3   LOKELMA 10 g PACK packet Take 10 g by mouth daily.     loperamide (IMODIUM) 2 MG capsule Take 2 mg by mouth as needed for diarrhea or loose stools.     meclizine (ANTIVERT) 25 MG  tablet Take 25 mg by mouth 3 (three) times daily as needed for dizziness.     nitroGLYCERIN (NITROSTAT) 0.4 MG SL tablet Take 1 tab under your tongue while sitting for chest pain, If no relief may repeat, one tab every 5 min up to 3 tabs total over 15 mins. 25 tablet 3   pantoprazole (PROTONIX) 40 MG tablet Take 1 tablet (40 mg total) by mouth 2 (two) times daily. 180 tablet 4   promethazine (PHENERGAN) 12.5 MG tablet TAKE 1 TABLET BY MOUTH EVERY 6 HOURS AS NEEDED FOR NAUSEA AND VOMITING 30 tablet 2   No current facility-administered medications for this visit.   Facility-Administered Medications Ordered in Other Visits  Medication Dose Route Frequency Provider Last Rate Last Admin   sodium chloride flush (NS) 0.9 % injection 3 mL  3 mL Intravenous Q12H Marrianne Mood D, PA-C         Review of Systems    ***.  All other systems reviewed and are otherwise negative except as noted above.    Physical Exam    VS:  There were no vitals taken for this visit. , BMI There is no height or weight on file to calculate BMI.     GEN: Well nourished, well developed, in no acute distress. HEENT: normal. Neck: Supple, no JVD, carotid bruits, or masses. Cardiac: RRR, no murmurs, rubs, or gallops. No clubbing, cyanosis, edema.  Radials 2+/PT 2+ and equal bilaterally.  Respiratory:  Respirations regular and unlabored, clear to auscultation bilaterally. GI: Soft, nontender, nondistended, BS + x 4. MS: no deformity or atrophy. Skin: warm and dry, no rash. Neuro:  Strength and sensation are intact. Psych: Normal affect.  Accessory Clinical Findings    ECG personally reviewed by me today - *** - no acute changes.  Lab Results  Component Value Date   WBC 7.3 08/06/2021   HGB 11.0 (L)  08/06/2021   HCT 34.9 08/06/2021   MCV 86 08/06/2021   PLT 366 08/06/2021   Lab Results  Component Value Date   CREATININE 1.85 (H) 08/06/2022   BUN 46 (H) 08/06/2022   NA 138 08/06/2022   K 6.2 (H) 08/06/2022   CL 112 (H) 08/06/2022   CO2 23 08/06/2022   Lab Results  Component Value Date   ALT 18 10/22/2021   AST 13 10/22/2021   GGT 487 (H) 06/03/2016   ALKPHOS 73 10/22/2021   BILITOT <0.2 10/22/2021   Lab Results  Component Value Date   CHOL 113 05/21/2022   HDL 47 05/21/2022   LDLCALC 52 05/21/2022   LDLDIRECT 163 (H) 12/17/2018   TRIG 65 05/21/2022   CHOLHDL 4.2 10/22/2021    Lab Results  Component Value Date   HGBA1C 9.0 12/15/2019    Assessment & Plan    1.  ***   Murray Hodgkins, NP 11/06/2022, 9:40 AM

## 2022-11-18 ENCOUNTER — Other Ambulatory Visit: Payer: Self-pay | Admitting: Cardiovascular Disease

## 2022-11-24 NOTE — Patient Instructions (Incomplete)
Diabetes Mellitus Basics  Diabetes mellitus, or diabetes, is a long-term (chronic) disease. It occurs when the body does not properly use sugar (glucose) that is released from food after you eat. Diabetes mellitus may be caused by one or both of these problems: Your pancreas does not make enough of a hormone called insulin. Your body does not react in a normal way to the insulin that it makes. Insulin lets glucose enter cells in your body. This gives you energy. If you have diabetes, glucose cannot get into cells. This causes high blood glucose (hyperglycemia). How to treat and manage diabetes You may need to take insulin or other diabetes medicines daily to keep your glucose in balance. If you are prescribed insulin, you will learn how to give yourself insulin by injection. You may need to adjust the amount of insulin you take based on the foods that you eat. You will need to check your blood glucose levels using a glucose monitor as told by your health care provider. The readings can help determine if you have low or high blood glucose. Generally, you should have these blood glucose levels: Before meals (preprandial): 80-130 mg/dL (4.4-7.2 mmol/L). After meals (postprandial): below 180 mg/dL (10 mmol/L). Hemoglobin A1c (HbA1c) level: less than 7%. Your health care provider will set treatment goals for you. Keep all follow-up visits. This is important. Follow these instructions at home: Diabetes medicines Take your diabetes medicines every day as told by your health care provider. List your diabetes medicines here: Name of medicine: ______________________________ Amount (dose): _______________ Time (a.m./p.m.): _______________ Notes: ___________________________________ Name of medicine: ______________________________ Amount (dose): _______________ Time (a.m./p.m.): _______________ Notes: ___________________________________ Name of medicine: ______________________________ Amount (dose):  _______________ Time (a.m./p.m.): _______________ Notes: ___________________________________ Insulin If you use insulin, list the types of insulin you use here: Insulin type: ______________________________ Amount (dose): _______________ Time (a.m./p.m.): _______________Notes: ___________________________________ Insulin type: ______________________________ Amount (dose): _______________ Time (a.m./p.m.): _______________ Notes: ___________________________________ Insulin type: ______________________________ Amount (dose): _______________ Time (a.m./p.m.): _______________ Notes: ___________________________________ Insulin type: ______________________________ Amount (dose): _______________ Time (a.m./p.m.): _______________ Notes: ___________________________________ Insulin type: ______________________________ Amount (dose): _______________ Time (a.m./p.m.): _______________ Notes: ___________________________________ Managing blood glucose  Check your blood glucose levels using a glucose monitor as told by your health care provider. Write down the times that you check your glucose levels here: Time: _______________ Notes: ___________________________________ Time: _______________ Notes: ___________________________________ Time: _______________ Notes: ___________________________________ Time: _______________ Notes: ___________________________________ Time: _______________ Notes: ___________________________________ Time: _______________ Notes: ___________________________________  Low blood glucose Low blood glucose (hypoglycemia) is when glucose is at or below 70 mg/dL (3.9 mmol/L). Symptoms may include: Feeling: Hungry. Sweaty and clammy. Irritable or easily upset. Dizzy. Sleepy. Having: A fast heartbeat. A headache. A change in your vision. Numbness around the mouth, lips, or tongue. Having trouble with: Moving (coordination). Sleeping. Treating low blood glucose To treat low blood  glucose, eat or drink something containing sugar right away. If you can think clearly and swallow safely, follow the 15:15 rule: Take 15 grams of a fast-acting carb (carbohydrate), as told by your health care provider. Some fast-acting carbs are: Glucose tablets: take 3-4 tablets. Hard candy: eat 3-5 pieces. Fruit juice: drink 4 oz (120 mL). Regular (not diet) soda: drink 4-6 oz (120-180 mL). Honey or sugar: eat 1 Tbsp (15 mL). Check your blood glucose levels 15 minutes after you take the carb. If your glucose is still at or below 70 mg/dL (3.9 mmol/L), take 15 grams of a carb again. If your glucose does not go above 70 mg/dL (3.9 mmol/L) after   3 tries, get help right away. After your glucose goes back to normal, eat a meal or a snack within 1 hour. Treating very low blood glucose If your glucose is at or below 54 mg/dL (3 mmol/L), you have very low blood glucose (severe hypoglycemia). This is an emergency. Do not wait to see if the symptoms will go away. Get medical help right away. Call your local emergency services (911 in the [place] Do not drive yourself to the hospital. Questions to ask your health care provider Should I talk with a diabetes educator? What equipment will I need to care for myself at home? What diabetes medicines do I need? When should I take them? How often do I need to check my blood glucose levels? What number can I call if I have questions? When is my follow-up visit? Where can I find a support group for people with diabetes? Where to find more information American Diabetes Association: www.diabetes.org Association of Diabetes Care and Education Specialists: www.diabeteseducator.org Contact a health care provider if: Your blood glucose is at or above 240 mg/dL (13.3 mmol/L) for 2 days in a row. You have been sick or have had a fever for 2 days or more, and you are not getting better. You have any of these problems for more than 6 hours: You cannot eat or  drink. You feel nauseous. You vomit. You have diarrhea. Get help right away if: Your blood glucose is lower than 54 mg/dL (3 mmol/L). You get confused. You have trouble thinking clearly. You have trouble breathing. These symptoms may represent a serious problem that is an emergency. Do not wait to see if the symptoms will go away. Get medical help right away. Call your local emergency services (911 in the [place] Do not drive yourself to the hospital. Summary Diabetes mellitus is a chronic disease that occurs when the body does not properly use sugar (glucose) that is released from food after you eat. Take insulin and diabetes medicines as told. Check your blood glucose every day, as often as told. Keep all follow-up visits. This is important. This information is not intended to replace advice given to you by your health care provider. Make sure you discuss any questions you have with your health care provider. Document Revised: 03/28/2020 Document Reviewed: 03/28/2020 Elsevier Patient Education  2023 [employer]  

## 2022-11-25 ENCOUNTER — Other Ambulatory Visit: Payer: Self-pay | Admitting: Cardiovascular Disease

## 2022-11-27 ENCOUNTER — Encounter: Payer: Self-pay | Admitting: Oncology

## 2022-11-27 ENCOUNTER — Encounter: Payer: Self-pay | Admitting: Nurse Practitioner

## 2022-11-27 ENCOUNTER — Ambulatory Visit: Payer: Medicare (Managed Care) | Admitting: Nurse Practitioner

## 2022-11-27 ENCOUNTER — Ambulatory Visit (INDEPENDENT_AMBULATORY_CARE_PROVIDER_SITE_OTHER): Payer: Medicare (Managed Care) | Admitting: Nurse Practitioner

## 2022-11-27 VITALS — BP 121/78 | HR 84 | Temp 98.1°F | Ht 65.0 in | Wt 285.2 lb

## 2022-11-27 DIAGNOSIS — E1169 Type 2 diabetes mellitus with other specified complication: Secondary | ICD-10-CM

## 2022-11-27 DIAGNOSIS — E785 Hyperlipidemia, unspecified: Secondary | ICD-10-CM

## 2022-11-27 DIAGNOSIS — I255 Ischemic cardiomyopathy: Secondary | ICD-10-CM

## 2022-11-27 DIAGNOSIS — Z6841 Body Mass Index (BMI) 40.0 and over, adult: Secondary | ICD-10-CM

## 2022-11-27 DIAGNOSIS — N184 Chronic kidney disease, stage 4 (severe): Secondary | ICD-10-CM

## 2022-11-27 DIAGNOSIS — E1122 Type 2 diabetes mellitus with diabetic chronic kidney disease: Secondary | ICD-10-CM | POA: Diagnosis not present

## 2022-11-27 DIAGNOSIS — E875 Hyperkalemia: Secondary | ICD-10-CM

## 2022-11-27 DIAGNOSIS — K3184 Gastroparesis: Secondary | ICD-10-CM

## 2022-11-27 DIAGNOSIS — R809 Proteinuria, unspecified: Secondary | ICD-10-CM

## 2022-11-27 DIAGNOSIS — I5032 Chronic diastolic (congestive) heart failure: Secondary | ICD-10-CM

## 2022-11-27 DIAGNOSIS — N183 Chronic kidney disease, stage 3 unspecified: Secondary | ICD-10-CM

## 2022-11-27 DIAGNOSIS — I25118 Atherosclerotic heart disease of native coronary artery with other forms of angina pectoris: Secondary | ICD-10-CM

## 2022-11-27 DIAGNOSIS — Z0000 Encounter for general adult medical examination without abnormal findings: Secondary | ICD-10-CM

## 2022-11-27 DIAGNOSIS — G4733 Obstructive sleep apnea (adult) (pediatric): Secondary | ICD-10-CM

## 2022-11-27 DIAGNOSIS — Z794 Long term (current) use of insulin: Secondary | ICD-10-CM

## 2022-11-27 DIAGNOSIS — Z87891 Personal history of nicotine dependence: Secondary | ICD-10-CM

## 2022-11-27 DIAGNOSIS — I700 Atherosclerosis of aorta: Secondary | ICD-10-CM

## 2022-11-27 DIAGNOSIS — E1143 Type 2 diabetes mellitus with diabetic autonomic (poly)neuropathy: Secondary | ICD-10-CM

## 2022-11-27 DIAGNOSIS — N2581 Secondary hyperparathyroidism of renal origin: Secondary | ICD-10-CM

## 2022-11-27 DIAGNOSIS — I130 Hypertensive heart and chronic kidney disease with heart failure and stage 1 through stage 4 chronic kidney disease, or unspecified chronic kidney disease: Secondary | ICD-10-CM

## 2022-11-27 DIAGNOSIS — E1129 Type 2 diabetes mellitus with other diabetic kidney complication: Secondary | ICD-10-CM

## 2022-11-27 DIAGNOSIS — Z7189 Other specified counseling: Secondary | ICD-10-CM

## 2022-11-27 DIAGNOSIS — Z1231 Encounter for screening mammogram for malignant neoplasm of breast: Secondary | ICD-10-CM

## 2022-11-27 DIAGNOSIS — Z23 Encounter for immunization: Secondary | ICD-10-CM

## 2022-11-27 DIAGNOSIS — M109 Gout, unspecified: Secondary | ICD-10-CM

## 2022-11-27 MED ORDER — FAMOTIDINE 40 MG PO TABS: 40.0000 mg | ORAL_TABLET | Freq: Every day | ORAL | 4 refills | Status: DC

## 2022-11-27 NOTE — Assessment & Plan Note (Signed)
Chronic, ongoing.  Continues on renal dosed Allopurinol with no further flares.

## 2022-11-27 NOTE — Patient Instructions (Addendum)
Please call to schedule your mammogram and/or bone density: [facility] [place]: [address], [place], [place] [address] Phone: [phone]  [place] [facility] [address] [place],  [place]  [address] Phone: [phone]     Can take Allegra 60 MG every 24 hours.  B12 and magnesium (3 days a week).  Diabetes Mellitus Basics  Diabetes mellitus, or diabetes, is a long-term (chronic) disease. It occurs when the body does not properly use sugar (glucose) that is released from food after you eat. Diabetes mellitus may be caused by one or both of these problems: Your pancreas does not make enough of a hormone called insulin. Your body does not react in a normal way to the insulin that it makes. Insulin lets glucose enter cells in your body. This gives you energy. If you have diabetes, glucose cannot get into cells. This causes high blood glucose (hyperglycemia). How to treat and manage diabetes You may need to take insulin or other diabetes medicines daily to keep your glucose in balance. If you are prescribed insulin, you will learn how to give yourself insulin by injection. You may need to adjust the amount of insulin you take based on the foods that you eat. You will need to check your blood glucose levels using a glucose monitor as told by your health care provider. The readings can help determine if you have low or high blood glucose. Generally, you should have these blood glucose levels: Before meals (preprandial): 80-130 mg/dL (4.4-7.2 mmol/L). After meals (postprandial): below 180 mg/dL (10 mmol/L). Hemoglobin A1c (HbA1c) level: less than 7%. Your health care provider will set treatment goals for you. Keep all follow-up visits. This is important. Follow these instructions at home: Diabetes medicines Take your diabetes medicines every day as told by your health care provider. List your diabetes medicines  here: Name of medicine: ______________________________ Amount (dose): _______________ Time (a.m./p.m.): _______________ Notes: ___________________________________ Name of medicine: ______________________________ Amount (dose): _______________ Time (a.m./p.m.): _______________ Notes: ___________________________________ Name of medicine: ______________________________ Amount (dose): _______________ Time (a.m./p.m.): _______________ Notes: ___________________________________ Insulin If you use insulin, list the types of insulin you use here: Insulin type: ______________________________ Amount (dose): _______________ Time (a.m./p.m.): _______________Notes: ___________________________________ Insulin type: ______________________________ Amount (dose): _______________ Time (a.m./p.m.): _______________ Notes: ___________________________________ Insulin type: ______________________________ Amount (dose): _______________ Time (a.m./p.m.): _______________ Notes: ___________________________________ Insulin type: ______________________________ Amount (dose): _______________ Time (a.m./p.m.): _______________ Notes: ___________________________________ Insulin type: ______________________________ Amount (dose): _______________ Time (a.m./p.m.): _______________ Notes: ___________________________________ Managing blood glucose  Check your blood glucose levels using a glucose monitor as told by your health care provider. Write down the times that you check your glucose levels here: Time: _______________ Notes: ___________________________________ Time: _______________ Notes: ___________________________________ Time: _______________ Notes: ___________________________________ Time: _______________ Notes: ___________________________________ Time: _______________ Notes: ___________________________________ Time: _______________ Notes: ___________________________________  Low blood glucose Low blood glucose  (hypoglycemia) is when glucose is at or below 70 mg/dL (3.9 mmol/L). Symptoms may include: Feeling: Hungry. Sweaty and clammy. Irritable or easily upset. Dizzy. Sleepy. Having: A fast heartbeat. A headache. A change in your vision. Numbness around the mouth, lips, or tongue. Having trouble with: Moving (coordination). Sleeping. Treating low blood glucose To treat low blood glucose, eat or drink something containing sugar right away. If you can think clearly and swallow safely, follow the 15:15 rule: Take 15 grams of a fast-acting carb (carbohydrate), as told by your health care provider. Some fast-acting carbs are: Glucose tablets: take 3-4 tablets. Hard candy: eat  3-5 pieces. Fruit juice: drink 4 oz (120 mL). Regular (not diet) soda: drink 4-6 oz (120-180 mL). Honey or sugar: eat 1 Tbsp (15 mL). Check your blood glucose levels 15 minutes after you take the carb. If your glucose is still at or below 70 mg/dL (3.9 mmol/L), take 15 grams of a carb again. If your glucose does not go above 70 mg/dL (3.9 mmol/L) after 3 tries, get help right away. After your glucose goes back to normal, eat a meal or a snack within 1 hour. Treating very low blood glucose If your glucose is at or below 54 mg/dL (3 mmol/L), you have very low blood glucose (severe hypoglycemia). This is an emergency. Do not wait to see if the symptoms will go away. Get medical help right away. Call your local emergency services (911 in the [place] Do not drive yourself to the hospital. Questions to ask your health care provider Should I talk with a diabetes educator? What equipment will I need to care for myself at home? What diabetes medicines do I need? When should I take them? How often do I need to check my blood glucose levels? What number can I call if I have questions? When is my follow-up visit? Where can I find a support group for people with diabetes? Where to find more information American Diabetes  Association: www.diabetes.org Association of Diabetes Care and Education Specialists: www.diabeteseducator.org Contact a health care provider if: Your blood glucose is at or above 240 mg/dL (13.3 mmol/L) for 2 days in a row. You have been sick or have had a fever for 2 days or more, and you are not getting better. You have any of these problems for more than 6 hours: You cannot eat or drink. You feel nauseous. You vomit. You have diarrhea. Get help right away if: Your blood glucose is lower than 54 mg/dL (3 mmol/L). You get confused. You have trouble thinking clearly. You have trouble breathing. These symptoms may represent a serious problem that is an emergency. Do not wait to see if the symptoms will go away. Get medical help right away. Call your local emergency services (911 in the [place] Do not drive yourself to the hospital. Summary Diabetes mellitus is a chronic disease that occurs when the body does not properly use sugar (glucose) that is released from food after you eat. Take insulin and diabetes medicines as told. Check your blood glucose every day, as often as told. Keep all follow-up visits. This is important. This information is not intended to replace advice given to you by your health care provider. Make sure you discuss any questions you have with your health care provider. Document Revised: 03/28/2020 Document Reviewed: 03/28/2020 Elsevier Patient Education  [place].

## 2022-11-27 NOTE — Assessment & Plan Note (Signed)
Chronic, stable, followed by cardiology.  Continue this collaboration and medication regimen as prescribed by them.

## 2022-11-27 NOTE — Assessment & Plan Note (Signed)
Chronic, ongoing, followed by cardiology.  Euvolemic today.  Continue current medication regimen as prescribed by them.  Recommend: - Reminded to call for an overnight weight gain of >2 pounds or a weekly weight gain of >5 pounds - not adding salt to food and read food labels. Reviewed the importance of keeping daily sodium intake to '2000mg'$  daily - Avoid all NSAID products

## 2022-11-27 NOTE — Assessment & Plan Note (Addendum)
Chronic, ongoing with poor tolerance to statins.  Followed by cardiology.  Continue Praluent and Zetia, as is intolerant to statins.  Lipid panel up to date.

## 2022-11-27 NOTE — Assessment & Plan Note (Signed)
BMI 47.46 with T2DM, HF, CKD.  Recommended eating smaller high protein, low fat meals more frequently and exercising 30 mins a day 5 times a week with a goal of 10-15lb weight loss in the next 3 months. Patient voiced their understanding and motivation to adhere to these recommendations.

## 2022-11-27 NOTE — Assessment & Plan Note (Signed)
Chronic, ongoing, followed by nephrology.  Recent notes and labs reviewed.  Continue current medications as prescribed by them.  BMP today, urine ALB 150 on check recently and up to date.  Continue focus on vegan style diet.

## 2022-11-27 NOTE — Assessment & Plan Note (Signed)
Chronic, ongoing, followed by nephrology.  Continue this collaboration - recent note and labs reviewed.  Urine ALB 150 with endo recently.

## 2022-11-27 NOTE — Assessment & Plan Note (Signed)
Chronic, stable.  Continue Lokelma daily.

## 2022-11-27 NOTE — Assessment & Plan Note (Signed)
Ongoing, continue collaboration with endocrinology.

## 2022-11-27 NOTE — Assessment & Plan Note (Signed)
Stable, no further discomfort.  Continue current diabetic regimen as ordered by endo and collaboration with both GI and endocrinology.  Continue to collaborate with CCM team as needed.

## 2022-11-27 NOTE — Addendum Note (Signed)
Addended by: Marnee Guarneri T on: 11/27/2022 10:43 AM   Modules accepted: Level of Service

## 2022-11-27 NOTE — Assessment & Plan Note (Signed)
Chronic, ongoing, followed by nephrology.  Continue this collaboration - recent note and labs reviewed.

## 2022-11-27 NOTE — Assessment & Plan Note (Signed)
Chronic, ongoing.  Followed by cardiology, continue this collaboration.  Recent notes reviewed.

## 2022-11-27 NOTE — Progress Notes (Addendum)
BP 121/78   Pulse 84   Temp 98.1 F (36.7 C) (Oral)   Ht _0  (1.651 m)   Wt 285 lb 3.2 oz (129.4 kg)   SpO2 98%   BMI 47.46 kg/m    Subjective:    Patient ID: [name] [name], female    DOB: 08/07/1981, 41 y.o.   MRN: [id number]  HPI: [name] [name] is a 41 y.o. female presenting on 11/27/2022 for comprehensive medical examination. Current medical complaints include:none  She currently lives with: family Menopausal Symptoms: no  DIABETES WITH GASTROPARESIS Followed by endocrinology, with last visit 09/16/22 8.2%.  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  Currently taking 88 units, to be taking 100 units.    Previously was being followed by GI, having less issues as she is no longer eating red meat or pork, with exception of fish/turkey/chicken, and had gone to more vegan diet.  Uses Dexcom.  Continues on Protonix and Pepcid. Hypoglycemic episodes:no Polydipsia/polyuria: no Visual disturbance: no Chest pain: no Paresthesias: no Glucose Monitoring: yes  Accucheck frequency: BID  Fasting glucose: with [name] have been reading higher, 180 to 200 range  Post prandial:  Evening: varies  Before meals: Taking Insulin?: yes  Long acting insulin: 88 Tresiba  Short acting insulin: sliding scale Blood Pressure Monitoring: daily Retinal Examination: Up to Date -- November 2023 Foot Exam: Up to Date Pneumovax: Up to Date Influenza: Up to Date Aspirin: yes   HYPERTENSION / HYPERLIPIDEMIA/HF/AORTIC ATHEROSCLEROSIS Followed by cardiology and last seen 8/329/23.  No recent NTG use. Satisfied with current treatment? yes Duration of hypertension: chronic BP monitoring frequency: daily BP range: 110-130/80's BP medication side effects: no Duration of hyperlipidemia: chronic Cholesterol supplements: none Medication compliance: good compliance Aspirin: yes Recent stressors: no Recurrent headaches: no Visual  changes: no Palpitations: no Dyspnea: no Chest pain: no Lower extremity edema: none Dizzy/lightheaded: none  CHRONIC KIDNEY DISEASE Is followed by nephrology for CKD and last saw 04/02/22 with recent labs 10/29/22 CRT 2.24, eGFR 27, PTH 202, K+ 5.1.  Continues on Allopurinol for gout. CKD status: stable Medications renally dose: yes Previous renal evaluation: yes Pneumovax:  Up to Date Influenza Vaccine:  Up to Date      11/27/2022    9:37 AM 05/21/2022    9:36 AM 02/20/2022   10:55 AM 01/23/2022    1:50 PM 11/14/2021    1:42 PM  Depression screen PHQ 2/9  Decreased Interest 0 0 0 0 2  Down, Depressed, Hopeless 0 0 0 0 2  PHQ - 2 Score 0 0 0 0 4  Altered sleeping 0 0 0 2 2  Tired, decreased energy 0 0 0 2 2  Change in appetite 0 0 0 2 0  Feeling bad or failure about yourself  0 0 0 0 0  Trouble concentrating 0 0 0 0 0  Moving slowly or fidgety/restless 0 0 0 0 0  Suicidal thoughts 0 0 0 0 0  PHQ-9 Score 0 0 0 6 8  Difficult doing work/chores Not difficult at all Not difficult at all   Not difficult at all    The patient does not have a history of falls. I did not complete a risk assessment for falls. A plan of care for falls was not documented.  Functional Status Survey: Is the patient deaf or have difficulty hearing?: No Does the patient have difficulty seeing, even when wearing glasses/contacts?: No Does the  patient have difficulty concentrating, remembering, or making decisions?: No Does the patient have difficulty walking or climbing stairs?: No Does the patient have difficulty dressing or bathing?: No Does the patient have difficulty doing errands alone such as visiting a doctor's office or shopping?: No   A voluntary discussion about advance care planning including the explanation and discussion of advance directives was extensively discussed  with the patient for 15 minutes with patient.  Explanation about the health care proxy and Living will was reviewed and packet  with forms with explanation of how to fill them out was given.  During this discussion, the patient was able to identify a health care proxy as her mother and plans to fill out the paperwork required.  Patient was offered a separate [place] visit for further assistance with forms.     Past Medical History:  Past Medical History:  Diagnosis Date   CAD (coronary artery disease)    a. 03/2015 NSTEMI/PCI: LCX 160m(DES), OM1 100p (DES - vessel labeled Ramus in subsequent cath report); b. 04/2015 Cath: patent stents/stable anatomy; c. 02/2016 MV: No isch; d. 11/2018 PCI: LCX 95p/m (PTCA->20%); e. 01/2019 PCI: RCA 85p/m (2.5x32 Synergy DES); f. 01/2021 Cath: LM 30, LAD 40ost/p, 596m, LCX 55p restenosis, OM1 10, OM2 40, RCA patent stent-->Med rx.   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 03/2015 Echo: EF 30-35%, mild concentric LVH, severe HK of inf, inflat, & lat walls, mod MR, mild TR;  b. 04/2015 LV Gram: EF 55-65%; c. 09/2017 Echo: EF 55-60%, no rwma. Mild MR; d. 11/2018 Echo: Ef 60-65%, no rwma; e. 06/2022 Echo: EF 60-65%, nl RV fxn, mildly dil LA, mild MR.   CKD (chronic kidney disease), stage IV ([facility]   Diabetes type 2, controlled ([facility]   a. since 2002    Diabetic gastroparesis (HCC)    a. chronic nausea/vomiting.   Diabetic retinopathy ([facility]   Diverticulosis, sigmoid 07/2016   Fatty liver    GERD (gastroesophageal reflux disease)    Heavy menses    a. H/O IUD - expired in 2014 - remains in place.   Hx of migraines    Hyperkalemia    a. On chronic lokelma - managed by nephrology.   Hyperlipidemia    Intolerant of atorvastatin, rosuvastatin   Hypertension    Iron deficiency anemia    Ischemic cardiomyopathy (resolved)    a. 03/2015 EF 30-35% post NSTEMI;  b. 04/2015 EF 55-65% on LV gram; c. 09/2017 Echo: EF 55-60%; d. 11/2018 Echo: Ef 60-65%; e. 06/2022 Echo: EF 60-65%.   Obesity    Tobacco abuse    a. quit 03/2015.    Uterine fibroid    Vertigo    Vitamin D deficiency     Surgical History:  Past Surgical History:  Procedure Laterality Date   APPENDECTOMY     CARDIAC CATHETERIZATION  03/2015   x2 stent AREdisto/A 05/05/2015   Procedure: Left Heart Cath and Coronary Angiography;  Surgeon: Peter M JoMartiniqueMD;  [facility];  Service: Cardiovascular;  Laterality: N/A;   CATARACT EXTRACTION Left 09/12/2021   CATARACT EXTRACTION Right 10/10/2021   COLONOSCOPY WITH PROPOFOL N/A 07/31/2016   Procedure: COLONOSCOPY WITH PROPOFOL;  Surgeon: MaLollie SailsMD;  [facility];  Service: Endoscopy;  Laterality: N/A;   CORONARY BALLOON ANGIOPLASTY N/A 12/04/2018   Procedure: CORONARY BALLOON ANGIOPLASTY;  Surgeon: Wellington Hampshire, MD;  Location: [place] [facility] LAB;  Service: Cardiovascular;  Laterality: N/A;   CORONARY STENT INTERVENTION N/A 01/12/2019   Procedure: CORONARY STENT INTERVENTION;  Surgeon: [name], MD;  Location: [place] [facility] LAB;  Service: Cardiovascular;  Laterality: N/A;   ESOPHAGOGASTRODUODENOSCOPY (EGD) WITH PROPOFOL N/A 07/31/2016   Procedure: ESOPHAGOGASTRODUODENOSCOPY (EGD) WITH PROPOFOL;  Surgeon: Lollie Sails, MD;  Location[facility];  Service: Endoscopy;  Laterality: N/A;   EYE SURGERY  07/16/2022   Membrane peeled   INTRAVASCULAR PRESSURE WIRE/FFR STUDY N/A 12/04/2018   Procedure: INTRAVASCULAR PRESSURE WIRE/FFR STUDY;  Surgeon: Wellington Hampshire, MD;  Location: [place] [facility] LAB;  Service: Cardiovascular;  Laterality: N/A;   LAPAROSCOPIC APPENDECTOMY N/A 08/07/2016   Procedure: APPENDECTOMY LAPAROSCOPIC;  Surgeon: Hubbard Robinson, MD;  Location: [facility] ORS;  Service: General;  Laterality: N/A;   LEFT HEART CATH AND CORONARY ANGIOGRAPHY Left 12/04/2018   Procedure: LEFT HEART CATH AND CORONARY ANGIOGRAPHY;  Surgeon: Wellington Hampshire, MD;  Location: [place] [facility] LAB;  Service: Cardiovascular;   Laterality: Left;   LEFT HEART CATH AND CORONARY ANGIOGRAPHY N/A 01/12/2019   Procedure: LEFT HEART CATH AND CORONARY ANGIOGRAPHY;  Surgeon: [name], MD;  Location: [place] [facility];  Service: Cardiovascular;  Laterality: N/A;   MOUTH SURGERY     RIGHT/LEFT HEART CATH AND CORONARY ANGIOGRAPHY N/A 01/29/2021   Procedure: RIGHT/LEFT HEART CATH AND CORONARY ANGIOGRAPHY;  Surgeon: Wellington Hampshire, MD;  Location: [place] [facility] LAB;  Service: Cardiovascular;  Laterality: N/A;   VITRECTOMY Left 10/30/2021    Medications:  Current Outpatient Medications on File Prior to Visit  Medication Sig   ACCU-CHEK AVIVA PLUS test strip USE 1 STRIP TO CHECK GLUCOSE THREE TIMES DAILY.  DX E11.29   acetaminophen (TYLENOL) 500 MG tablet Take 1,000 mg by mouth every 4 (four) hours as needed.   Alirocumab (PRALUENT) 150 MG/ML SOAJ Inject 1 mL into the skin every 14 (fourteen) days.   allopurinol (ZYLOPRIM) 100 MG tablet Take 0.5 tablets (50 mg total) by mouth daily.   aspirin EC 81 MG tablet Take 81 mg by mouth daily.   BD PEN NEEDLE MICRO U/F 32G X 6 MM MISC Inject into the skin.   benzonatate (TESSALON PERLES) 100 MG capsule Take 1 capsule (100 mg total) by mouth 3 (three) times daily as needed for cough.   carvedilol (COREG) 12.5 MG tablet Take 1 tablet by mouth twice daily   clopidogrel (PLAVIX) 75 MG tablet Take 1 tablet (75 mg total) by mouth daily.   Continuous Blood Gluc Receiver (DEXCOM G6 RECEIVER) DEVI    Continuous Blood Gluc Sensor (DEXCOM G6 SENSOR) MISC See admin instructions.   Continuous Blood Gluc Transmit (DEXCOM G6 TRANSMITTER) MISC    EPINEPHrine 0.3 mg/0.3 mL IJ SOAJ injection See admin instructions.   EQ ALLERGY RELIEF, CETIRIZINE, 10 MG tablet Take 10 mg by mouth daily as needed.   etonogestrel (NEXPLANON) 68 MG IMPL implant 1 each (68 mg total) by Subdermal route once for 1 dose.   ezetimibe (ZETIA) 10 MG tablet Take 1 tablet (10 mg total) by mouth daily.   furosemide  (LASIX) 40 MG tablet Take 1 tablet (40 mg total) by mouth as directed. Take 1 tablet daily and may take an extra 40 mg daily as needed for weight gain of 2 pounds or more in 24 hours.   insulin aspart (NOVOLOG) 100 UNIT/ML injection Inject 10 Units into the skin 3 (three) times daily  with meals.   Insulin Glargine (BASAGLAR KWIKPEN) 100 UNIT/ML Inject 70 Units into the skin daily.   isosorbide mononitrate (IMDUR) 120 MG 24 hr tablet Take 1 tablet (120 mg total) by mouth daily.   LOKELMA 10 g PACK packet Take 10 g by mouth daily.   loperamide (IMODIUM) 2 MG capsule Take 2 mg by mouth as needed for diarrhea or loose stools.   nitroGLYCERIN (NITROSTAT) 0.4 MG SL tablet Take 1 tab under your tongue while sitting for chest pain, If no relief may repeat, one tab every 5 min up to 3 tabs total over 15 mins.   pantoprazole (PROTONIX) 40 MG tablet Take 1 tablet (40 mg total) by mouth 2 (two) times daily.   Current Facility-Administered Medications on File Prior to Visit  Medication   sodium chloride flush (NS) 0.9 % injection 3 mL    Allergies:  Allergies  Allergen Reactions   Lisinopril Anaphylaxis, Itching and Swelling   Other Itching and Swelling    [place] seasoning    Rosemary Oil Anaphylaxis   Shellfish Allergy Itching and Swelling    Pt states that she HAS had IV Contrast without any reaction   Statins Other (See Comments)    Myalgias   Metformin And Related Diarrhea, Nausea And Vomiting and Rash    Social History:  Social History   Socioeconomic History   Marital status: Married    Spouse name: Loss adjuster, chartered   Number of children: Not on file   Years of education: Not on file   Highest education level: Not on file  Occupational History   Not on file  Tobacco Use   Smoking status: Former    Years: 15.00    Types: Cigarettes    Quit date: 03/10/2015    Years since quitting: 7.7   Smokeless tobacco: Never  Vaping Use   Vaping Use: Never used  Substance and Sexual Activity   Alcohol  use: No    Alcohol/week: 0.0 standard drinks of alcohol   Drug use: Not Currently   Sexual activity: Yes    Birth control/protection: Implant  Other Topics Concern   Not on file  Social History Narrative   Lives locally with daughter and husband.   Social Determinants of Health   Financial Resource Strain: Low Risk  (11/27/2022)   Overall Financial Resource Strain (CARDIA)    Difficulty of Paying Living Expenses: Not very hard  Food Insecurity: No Food Insecurity (11/27/2022)   Hunger Vital Sign    Worried About Running Out of Food in the Last Year: Never true    Ran Out of Food in the Last Year: Never true  Transportation Needs: No Transportation Needs (11/27/2022)   PRAPARE - Hydrologist (Medical): No    Lack of Transportation (Non-Medical): No  Physical Activity: Insufficiently Active (11/27/2022)   Exercise Vital Sign    Days of Exercise per Week: 2 days    Minutes of Exercise per Session: 10 min  Stress: No Stress Concern Present (11/27/2022)   [place]    Feeling of Stress : Only a little  Social Connections: Moderately Integrated (11/27/2022)   Social Connection and Isolation Panel [NHANES]    Frequency of Communication with Friends and Family: Three times a week    Frequency of Social Gatherings with Friends and Family: Three times a week    Attends Religious Services: More than 4 times per year    Active  Member of Clubs or Organizations: Yes    Attends Music therapist: More than 4 times per year    Marital Status: Never married  Intimate Partner Violence: Not At Risk (11/27/2022)   Humiliation, Afraid, Rape, and Kick questionnaire    Fear of Current or Ex-Partner: No    Emotionally Abused: No    Physically Abused: No    Sexually Abused: No   Social History   Tobacco Use  Smoking Status Former   Years: 15.00   Types: Cigarettes   Quit date: 03/10/2015    Years since quitting: 7.7  Smokeless Tobacco Never   Social History   Substance and Sexual Activity  Alcohol Use No   Alcohol/week: 0.0 standard drinks of alcohol    Family History:  Family History  Problem Relation Age of Onset   Diabetes Mother    Diabetes Father    Cancer - Cervical Maternal Aunt    Cancer Maternal Grandmother        lung   Cancer Maternal Grandfather        prostate   Diabetes Paternal Grandmother    Diabetes Paternal Grandfather     Past medical history, surgical history, medications, allergies, family history and social history reviewed with patient today and changes made to appropriate areas of the chart.   ROS All other ROS negative except what is listed above and in the HPI.      Objective:    BP 121/78   Pulse 84   Temp 98.1 F (36.7 C) (Oral)   Ht _0  (1.651 m)   Wt 285 lb 3.2 oz (129.4 kg)   SpO2 98%   BMI 47.46 kg/m   Wt Readings from Last 3 Encounters:  11/27/22 285 lb 3.2 oz (129.4 kg)  08/06/22 281 lb 9.6 oz (127.7 kg)  06/06/22 278 lb (126.1 kg)    Physical Exam Vitals and nursing note reviewed. Exam conducted with a chaperone present.  Constitutional:      General: She is awake. She is not in acute distress.    Appearance: She is well-developed and well-groomed. She is not ill-appearing or toxic-appearing.  HENT:     Head: Normocephalic and atraumatic.     Right Ear: Hearing, tympanic membrane, ear canal and external ear normal. No drainage.     Left Ear: Hearing, tympanic membrane, ear canal and external ear normal. No drainage.     Nose: Nose normal.     Right Sinus: No maxillary sinus tenderness or frontal sinus tenderness.     Left Sinus: No maxillary sinus tenderness or frontal sinus tenderness.     Mouth/Throat:     Mouth: Mucous membranes are moist.     Pharynx: Oropharynx is clear. Uvula midline. No pharyngeal swelling, oropharyngeal exudate or posterior oropharyngeal erythema.  Eyes:     General: Lids are  normal.        Right eye: No discharge.        Left eye: No discharge.     Extraocular Movements: Extraocular movements intact.     Conjunctiva/sclera: Conjunctivae normal.     Pupils: Pupils are equal, round, and reactive to light.     Visual Fields: Right eye visual fields normal and left eye visual fields normal.  Neck:     Thyroid: No thyromegaly.     Vascular: No carotid bruit.     Trachea: Trachea normal.  Cardiovascular:     Rate and Rhythm: Normal rate and regular rhythm.  Heart sounds: Normal heart sounds. No murmur heard.    No gallop.  Pulmonary:     Effort: Pulmonary effort is normal. No accessory muscle usage or respiratory distress.     Breath sounds: Normal breath sounds.  Chest:  Breasts:    Right: Normal.     Left: Normal.  Abdominal:     General: Bowel sounds are normal.     Palpations: Abdomen is soft. There is no hepatomegaly or splenomegaly.     Tenderness: There is no abdominal tenderness.  Musculoskeletal:        General: Normal range of motion.     Cervical back: Normal range of motion and neck supple.     Right lower leg: No edema.     Left lower leg: No edema.  Lymphadenopathy:     Head:     Right side of head: No submental, submandibular, tonsillar, preauricular or posterior auricular adenopathy.     Left side of head: No submental, submandibular, tonsillar, preauricular or posterior auricular adenopathy.     Cervical: No cervical adenopathy.     Upper Body:     Right upper body: No supraclavicular, axillary or pectoral adenopathy.     Left upper body: No supraclavicular, axillary or pectoral adenopathy.  Skin:    General: Skin is warm and dry.     Capillary Refill: Capillary refill takes less than 2 seconds.     Findings: No rash.  Neurological:     Mental Status: She is alert and oriented to person, place, and time.     Gait: Gait is intact.     Deep Tendon Reflexes: Reflexes are normal and symmetric.     Reflex Scores:       Brachioradialis reflexes are 2+ on the right side and 2+ on the left side.      Patellar reflexes are 2+ on the right side and 2+ on the left side. Psychiatric:        Attention and Perception: Attention normal.        Mood and Affect: Mood normal.        Speech: Speech normal.        Behavior: Behavior normal. Behavior is cooperative.        Thought Content: Thought content normal.        Judgment: Judgment normal.    Results for orders placed or performed during the hospital encounter of 20/25/42  Basic Metabolic Panel (BMET)  Result Value Ref Range   Sodium 138 135 - 145 mmol/L   Potassium 6.2 (H) 3.5 - 5.1 mmol/L   Chloride 112 (H) 98 - 111 mmol/L   CO2 23 22 - 32 mmol/L   Glucose, Bld 120 (H) 70 - 99 mg/dL   BUN 46 (H) 6 - 20 mg/dL   Creatinine, Ser 1.85 (H) 0.44 - 1.00 mg/dL   Calcium 9.2 8.9 - 10.3 mg/dL   GFR, Estimated 34 (L) >60 mL/min   Anion gap 3 (L) 5 - 15      Assessment & Plan:   Problem List Items Addressed This Visit       Cardiovascular and Mediastinum   Chronic diastolic heart failure (HCC) (Chronic)    Chronic, ongoing, followed by cardiology.  Euvolemic today.  Continue current medication regimen as prescribed by them.  Recommend: - Reminded to call for an overnight weight gain of >2 pounds or a weekly weight gain of >5 pounds - not adding salt to food and read food labels. Reviewed the  importance of keeping daily sodium intake to <2015m daily - Avoid all NSAID products      Atherosclerosis of aorta ([facility]    Noted on past imaging.  Continue Plavix and Praluent for prevention.      Coronary artery disease of native artery of native heart with stable angina pectoris (HCC)    Chronic, stable, followed by cardiology.  Continue this collaboration and medication regimen as prescribed by them.      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  BP at goal today.  Recommend she monitor BP at least a few  mornings a week at home and document.  DASH diet at home.  Continue current medication regimen and adjust as needed -- continue collaboration with cardiology.  Labs today: up to date.         Ischemic cardiomyopathy    Chronic, ongoing.  Followed by cardiology, continue this collaboration.  Recent notes reviewed.        Digestive   Diabetic gastroparesis associated with type 2 diabetes mellitus (HCC)    Stable, no further discomfort.  Continue current diabetic regimen as ordered by endo and collaboration with both GI and endocrinology.  Continue to collaborate with CCM team as needed.         Endocrine   CKD stage 4 due to type 2 diabetes mellitus (HCC)    Chronic, ongoing, followed by nephrology.  Recent notes and labs reviewed.  Continue current medications as prescribed by them.  BMP today, urine ALB 150 on check recently and up to date.  Continue focus on vegan style diet.      Hyperlipidemia associated with type 2 diabetes mellitus (HCC)    Chronic, ongoing with poor tolerance to statins.  Followed by cardiology.  Continue Praluent and Zetia, as is intolerant to statins.  Lipid panel up to date.      Hyperparathyroidism due to renal insufficiency (HCC)    Chronic, ongoing, followed by nephrology.  Continue this collaboration - recent note and labs reviewed.      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)    Chronic, ongoing, followed by nephrology.  Continue this collaboration - recent note and labs reviewed.  Urine ALB 150 with endo recently.        Musculoskeletal and Integument   Gouty arthritis    Chronic, ongoing.  Continues on renal dosed Allopurinol with no further flares.        Other   Advanced care planning/counseling discussion    Discussed with patient at length and provided package.      Hyperkalemia    Chronic, stable.  Continue Lokelma daily.      Long term (current) use of insulin (HEnon    Ongoing, continue collaboration  with endocrinology.      Obesity    BMI 47.46 with T2DM, HF, CKD.  Recommended eating smaller high protein, low fat meals more frequently and exercising 30 mins a day 5 times a week with a goal of 10-15lb weight loss in the next 3 months. Patient voiced their understanding and motivation to adhere to these recommendations.       Other Visit Diagnoses     Medicare annual wellness visit, subsequent    -  Primary   Due for Medicare Wellness and performed today.   Encounter for screening mammogram for malignant neoplasm of breast       Mammogram ordered and discussed with  patient   Relevant Orders   MM 3D SCREEN BREAST BILATERAL   Flu vaccine need       Flu vaccine today   Relevant Orders   Flu Vaccine QUAD 6+ mos PF IM (Fluarix Quad PF)        Follow up plan: Return in about 6 months (around 05/29/2023) for T2DM, HTN/HLD, CKD, VIT D, GERD.   LABORATORY TESTING:  - Pap smear: up to date  IMMUNIZATIONS:   - Tdap: Tetanus vaccination status reviewed: last tetanus booster within 10 years. - Influenza: Administered today - Pneumovax: Up to date - Prevnar: Not applicable - COVID: Refused - HPV: Refused - Shingrix vaccine: Not applicable  SCREENING: -Mammogram: Ordered today  - Colonoscopy: Not applicable  - Bone Density: Not applicable  -Hearing Test: Done elsewhere  -Spirometry: Not applicable   PATIENT COUNSELING:   Advised to take 1 mg of folate supplement per day if capable of pregnancy.   Sexuality: Discussed sexually transmitted diseases, partner selection, use of condoms, avoidance of unintended pregnancy  and contraceptive alternatives.   Advised to avoid cigarette smoking.  I discussed with the patient that most people either abstain from alcohol or drink within safe limits (<=14/week and <=4 drinks/occasion for males, <=7/weeks and <= 3 drinks/occasion for females) and that the risk for alcohol disorders and other health effects rises proportionally with the  number of drinks per week and how often a drinker exceeds daily limits.  Discussed cessation/primary prevention of drug use and availability of treatment for abuse.   Diet: Encouraged to adjust caloric intake to maintain  or achieve ideal body weight, to reduce intake of dietary saturated fat and total fat, to limit sodium intake by avoiding high sodium foods and not adding table salt, and to maintain adequate dietary potassium and calcium preferably from fresh fruits, vegetables, and low-fat dairy products.    Stressed the importance of regular exercise  Injury prevention: Discussed safety belts, safety helmets, smoke detector, smoking near bedding or upholstery.   Dental health: Discussed importance of regular tooth brushing, flossing, and dental visits.    NEXT PREVENTATIVE PHYSICAL DUE IN 1 YEAR. Return in about 6 months (around 05/29/2023) for T2DM, HTN/HLD, CKD, VIT D, GERD.

## 2022-11-27 NOTE — Assessment & Plan Note (Signed)
Noted on past imaging.  Continue Plavix and Praluent for prevention.

## 2022-11-27 NOTE — Assessment & Plan Note (Signed)
Chronic, stable.  BP at goal today.  Recommend she monitor BP at least a few mornings a week at home and document.  DASH diet at home.  Continue current medication regimen and adjust as needed -- continue collaboration with cardiology.  Labs today: up to date.

## 2022-11-27 NOTE — Assessment & Plan Note (Signed)
Discussed with patient at length and provided package.

## 2022-11-30 ENCOUNTER — Other Ambulatory Visit: Payer: Self-pay | Admitting: Cardiovascular Disease

## 2022-12-03 ENCOUNTER — Other Ambulatory Visit: Payer: Self-pay | Admitting: Cardiovascular Disease

## 2022-12-27 ENCOUNTER — Other Ambulatory Visit: Payer: Self-pay | Admitting: Cardiovascular Disease

## 2023-01-05 ENCOUNTER — Other Ambulatory Visit: Payer: Self-pay | Admitting: Cardiovascular Disease

## 2023-01-09 ENCOUNTER — Other Ambulatory Visit: Payer: Self-pay | Admitting: Nurse Practitioner

## 2023-01-09 NOTE — Telephone Encounter (Signed)
Requested medication (s) are due for refill today - expired Rx  Requested medication (s) are on the active medication list - yes  Future visit scheduled -yes  Last refill: 10/19/21 #180 4RF  Notes to clinic: expired Rx  Requested Prescriptions  Pending Prescriptions Disp Refills   pantoprazole (PROTONIX) 40 MG tablet [Pharmacy Med Name: Pantoprazole Sodium 40 MG Oral Tablet Delayed Release] 180 tablet 0    Sig: Take 1 tablet by mouth twice daily     Gastroenterology: Proton Pump Inhibitors Passed - 01/09/2023 11:21 AM      Passed - Valid encounter within last 12 months    Recent Outpatient Visits           1 month ago Medicare annual wellness visit, subsequent   [place], [place] T, NP   7 months ago Gouty arthritis   [place], [place] T, NP   7 months ago Persistent proteinuria associated with type 2 diabetes mellitus ([place])   [place], Henrine Screws T, NP   8 months ago Left leg pain   [place], [place] T, NP   10 months ago 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, [facility]               Requested Prescriptions  Pending Prescriptions Disp Refills   pantoprazole (PROTONIX) 40 MG tablet [Pharmacy Med Name: Pantoprazole Sodium 40 MG Oral Tablet Delayed Release] 180 tablet 0    Sig: Take 1 tablet by mouth twice daily     Gastroenterology: Proton Pump Inhibitors Passed - 01/09/2023 11:21 AM      Passed - Valid encounter within last 12 months    Recent Outpatient Visits           1 month ago Medicare annual wellness visit, subsequent   [place], [place] T, NP   7 months ago Gouty arthritis   [place], [place] T, NP   7 months ago Persistent proteinuria associated with type 2 diabetes mellitus ([place])   [place], [place] T, NP   8 months ago Left leg pain   [place], [place] T, NP   10 months ago 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

## 2023-01-30 ENCOUNTER — Encounter: Payer: Self-pay | Admitting: Nurse Practitioner

## 2023-01-30 DIAGNOSIS — J301 Allergic rhinitis due to pollen: Secondary | ICD-10-CM

## 2023-01-30 DIAGNOSIS — R0989 Other specified symptoms and signs involving the circulatory and respiratory systems: Secondary | ICD-10-CM

## 2023-02-13 ENCOUNTER — Encounter: Payer: Self-pay | Admitting: Nurse Practitioner

## 2023-02-13 NOTE — Telephone Encounter (Signed)
Called lmom for the pt to call the office to get scheduled.

## 2023-02-14 ENCOUNTER — Other Ambulatory Visit: Payer: Self-pay | Admitting: Cardiovascular Disease

## 2023-02-14 NOTE — Telephone Encounter (Signed)
Please reschedule F/U appointment for further refills. Thank you! 

## 2023-02-14 NOTE — Telephone Encounter (Signed)
Pt has called back in and scheduled an appointment with Jolene on Wednesday 02/19/2023 2:40

## 2023-02-16 NOTE — Patient Instructions (Signed)
Urinary Tract Infection, Adult A urinary tract infection (UTI) is an infection of any part of the urinary tract. The urinary tract includes: The kidneys. The ureters. The bladder. The urethra. These organs make, store, and get rid of pee (urine) in the body. What are the causes? This infection is caused by germs (bacteria) in your genital area. These germs grow and cause swelling (inflammation) of your urinary tract. What increases the risk? The following factors may make you more likely to develop this condition: Using a small, thin tube (catheter) to drain pee. Not being able to control when you pee or poop (incontinence). Being female. If you are female, these things can increase the risk: Using these methods to prevent pregnancy: A medicine that kills sperm (spermicide). A device that blocks sperm (diaphragm). Having low levels of a female hormone (estrogen). Being pregnant. You are more likely to develop this condition if: You have genes that add to your risk. You are sexually active. You take antibiotic medicines. You have trouble peeing because of: A prostate that is bigger than normal, if you are female. A blockage in the part of your body that drains pee from the bladder. A kidney stone. A nerve condition that affects your bladder. Not getting enough to drink. Not peeing often enough. You have other conditions, such as: Diabetes. A weak disease-fighting system (immune system). Sickle cell disease. Gout. Injury of the spine. What are the signs or symptoms? Symptoms of this condition include: Needing to pee right away. Peeing small amounts often. Pain or burning when peeing. Blood in the pee. Pee that smells bad or not like normal. Trouble peeing. Pee that is cloudy. Fluid coming from the vagina, if you are female. Pain in the belly or lower back. Other symptoms include: Vomiting. Not feeling hungry. Feeling mixed up (confused). This may be the first symptom in  older adults. Being tired and grouchy (irritable). A fever. Watery poop (diarrhea). How is this treated? Taking antibiotic medicine. Taking other medicines. Drinking enough water. In some cases, you may need to see a specialist. Follow these instructions at home:  Medicines Take over-the-counter and prescription medicines only as told by your doctor. If you were prescribed an antibiotic medicine, take it as told by your doctor. Do not stop taking it even if you start to feel better. General instructions Make sure you: Pee until your bladder is empty. Do not hold pee for a long time. Empty your bladder after sex. Wipe from front to back after peeing or pooping if you are a female. Use each tissue one time when you wipe. Drink enough fluid to keep your pee pale yellow. Keep all follow-up visits. Contact a doctor if: You do not get better after 1-2 days. Your symptoms go away and then come back. Get help right away if: You have very bad back pain. You have very bad pain in your lower belly. You have a fever. You have chills. You feeling like you will vomit or you vomit. Summary A urinary tract infection (UTI) is an infection of any part of the urinary tract. This condition is caused by germs in your genital area. There are many risk factors for a UTI. Treatment includes antibiotic medicines. Drink enough fluid to keep your pee pale yellow. This information is not intended to replace advice given to you by your health care provider. Make sure you discuss any questions you have with your health care provider. Document Revised: 07/07/2020 Document Reviewed: 07/07/2020 Elsevier Patient Education    2023 Elsevier Inc.  

## 2023-02-19 ENCOUNTER — Encounter: Payer: Self-pay | Admitting: Nurse Practitioner

## 2023-02-19 ENCOUNTER — Ambulatory Visit (INDEPENDENT_AMBULATORY_CARE_PROVIDER_SITE_OTHER): Payer: Medicare (Managed Care) | Admitting: Nurse Practitioner

## 2023-02-19 VITALS — BP 118/77 | HR 77 | Temp 98.2°F | Ht 65.0 in | Wt 286.9 lb

## 2023-02-19 DIAGNOSIS — R399 Unspecified symptoms and signs involving the genitourinary system: Secondary | ICD-10-CM | POA: Diagnosis not present

## 2023-02-19 DIAGNOSIS — B9689 Other specified bacterial agents as the cause of diseases classified elsewhere: Secondary | ICD-10-CM | POA: Insufficient documentation

## 2023-02-19 DIAGNOSIS — N760 Acute vaginitis: Secondary | ICD-10-CM

## 2023-02-19 LAB — URINALYSIS, ROUTINE W REFLEX MICROSCOPIC
Bilirubin, UA: NEGATIVE
Glucose, UA: NEGATIVE
Ketones, UA: NEGATIVE
Nitrite, UA: NEGATIVE
Specific Gravity, UA: 1.01 (ref 1.005–1.030)
Urobilinogen, Ur: 0.2 mg/dL (ref 0.2–1.0)
pH, UA: 5.5 (ref 5.0–7.5)

## 2023-02-19 LAB — MICROSCOPIC EXAMINATION: Bacteria, UA: NONE SEEN

## 2023-02-19 LAB — WET PREP FOR TRICH, YEAST, CLUE
Clue Cell Exam: POSITIVE — AB
Trichomonas Exam: NEGATIVE
Yeast Exam: NEGATIVE

## 2023-02-19 MED ORDER — METRONIDAZOLE 500 MG PO TABS: 500.0000 mg | ORAL_TABLET | Freq: Two times a day (BID) | ORAL | 0 refills | Status: AC

## 2023-02-19 MED ORDER — FLUCONAZOLE 150 MG PO TABS: 150.0000 mg | ORAL_TABLET | Freq: Once | ORAL | 0 refills | Status: AC

## 2023-02-19 NOTE — Assessment & Plan Note (Signed)
Acute, wet prep + clue cells.  Discussed with patient at length.  Send in Flagyl 500 MG BID for 7 days. Recommend take with food and avoid alcohol.  Educated her on diagnosis and medication.  Return if worsening or ongoing symptoms.

## 2023-02-19 NOTE — Assessment & Plan Note (Addendum)
Acute and ongoing with UA + leuks and WBC, will send for culture to further assess and treat as needed.  Wet prep + clue cells, refer to BV plan of care.  Send in Diflucan to help avoid yeast infection with this.

## 2023-02-19 NOTE — Progress Notes (Signed)
BP 118/77   Pulse 77   Temp 98.2 F (36.8 C) (Oral)   Ht '5\' 5"'$  (1.651 m)   Wt 286 lb 14.4 oz (130.1 kg)   BMI 47.74 kg/m    Subjective:    Patient ID: [name] [name], female    DOB: 1981/09/25, 42 y.o.   MRN: [id number]  HPI: [name] [name] is a 42 y.o. female  Chief Complaint  Patient presents with   painful urination    Started last week    URINARY SYMPTOMS Started sometime last week, feeling more fatigue and painful urination + nausea. Dysuria: burning Urinary frequency: no Urgency: yes Small volume voids: no Symptom severity: yes Urinary incontinence: yes - dribbling Foul odor: yes Hematuria: no Abdominal pain: yes Back pain: yes Suprapubic pain/pressure: yes Flank pain: no Fever:  low grade last week Vomiting: yes Relief with cranberry juice: no Relief with pyridium: no Status: better/worse/stable Previous urinary tract infection: yes Recurrent urinary tract infection: no Sexual activity: monogamous History of sexually transmitted disease: no Treatments attempted: pyridium, cranberry, and increasing fluids    Relevant past medical, surgical, family and social history reviewed and updated as indicated. Interim medical history since our last visit reviewed. Allergies and medications reviewed and updated.  Review of Systems  Constitutional:  Negative for activity change, appetite change, diaphoresis, fatigue and fever.  Respiratory:  Negative for cough, chest tightness and shortness of breath.   Cardiovascular:  Negative for chest pain, palpitations and leg swelling.  Gastrointestinal:  Positive for abdominal pain, nausea and vomiting. Negative for abdominal distention, constipation and diarrhea.  Genitourinary:  Positive for dysuria and urgency. Negative for decreased urine volume, difficulty urinating and frequency.  Neurological: Negative.   Psychiatric/Behavioral: Negative.      Per HPI unless specifically indicated above     Objective:    BP  118/77   Pulse 77   Temp 98.2 F (36.8 C) (Oral)   Ht '5\' 5"'$  (1.651 m)   Wt 286 lb 14.4 oz (130.1 kg)   BMI 47.74 kg/m   Wt Readings from Last 3 Encounters:  02/19/23 286 lb 14.4 oz (130.1 kg)  11/27/22 285 lb 3.2 oz (129.4 kg)  08/06/22 281 lb 9.6 oz (127.7 kg)    Physical Exam Vitals and nursing note reviewed.  Constitutional:      General: She is awake.     Appearance: She is well-developed and well-groomed. She is obese. She is not ill-appearing or toxic-appearing.  HENT:     Head: Normocephalic.     Right Ear: Hearing and external ear normal.     Left Ear: Hearing and external ear normal.  Eyes:     General: Lids are normal.        Right eye: No discharge.        Left eye: No discharge.     Conjunctiva/sclera: Conjunctivae normal.     Pupils: Pupils are equal, round, and reactive to light.  Neck:     Vascular: No carotid bruit.  Cardiovascular:     Rate and Rhythm: Normal rate and regular rhythm.     Heart sounds: Normal heart sounds. No murmur heard.    No gallop.  Pulmonary:     Effort: Pulmonary effort is normal. No accessory muscle usage or respiratory distress.     Breath sounds: Normal breath sounds.  Abdominal:     General: Bowel sounds are normal. There is no distension.     Palpations: Abdomen is soft.     Tenderness:  There is abdominal tenderness in the suprapubic area. There is no right CVA tenderness or left CVA tenderness.  Musculoskeletal:     Cervical back: Normal range of motion and neck supple.     Right lower leg: No edema.     Left lower leg: No edema.  Skin:    General: Skin is warm and dry.  Neurological:     Mental Status: She is alert and oriented to person, place, and time.  Psychiatric:        Attention and Perception: Attention normal.        Mood and Affect: Mood normal.        Behavior: Behavior normal. Behavior is cooperative.        Thought Content: Thought content normal.        Judgment: Judgment normal.     Results for  orders placed or performed during the hospital encounter of [id number]  Basic Metabolic Panel (BMET)  Result Value Ref Range   Sodium 138 135 - 145 mmol/L   Potassium 6.2 (H) 3.5 - 5.1 mmol/L   Chloride 112 (H) 98 - 111 mmol/L   CO2 23 22 - 32 mmol/L   Glucose, Bld 120 (H) 70 - 99 mg/dL   BUN 46 (H) 6 - 20 mg/dL   Creatinine, Ser 1.85 (H) 0.44 - 1.00 mg/dL   Calcium 9.2 8.9 - 10.3 mg/dL   GFR, Estimated 34 (L) >60 mL/min   Anion gap 3 (L) 5 - 15      Assessment & Plan:   Problem List Items Addressed This Visit       Genitourinary   Bacterial vaginosis    Acute, wet prep + clue cells.  Discussed with patient at length.  Send in Flagyl 500 MG BID for 7 days. Recommend take with food and avoid alcohol.  Educated her on diagnosis and medication.  Return if worsening or ongoing symptoms.      Relevant Medications   metroNIDAZOLE (FLAGYL) 500 MG tablet   fluconazole (DIFLUCAN) 150 MG tablet     Other   Urinary symptom or sign - Primary    Acute and ongoing with UA + leuks and WBC, will send for culture to further assess and treat as needed.  Wet prep + clue cells, refer to BV plan of care.  Send in Diflucan to help avoid yeast infection with this.        Relevant Orders   Urinalysis, Routine w reflex microscopic   WET PREP FOR TRICH, YEAST, CLUE   Urine Culture     Follow up plan: Return for as scheduled June 20th.

## 2023-02-21 LAB — URINE CULTURE

## 2023-02-21 NOTE — Progress Notes (Signed)
Contacted via MyChart   Urine culture shows no infection in urine. Great news!!  Continue Flagyl for bacterial infection in vagina:)

## 2023-03-12 NOTE — Telephone Encounter (Signed)
Left voicemail to schedule past due follow up appointment

## 2023-03-14 ENCOUNTER — Other Ambulatory Visit: Payer: Self-pay | Admitting: Cardiovascular Disease

## 2023-03-16 ENCOUNTER — Other Ambulatory Visit: Payer: Self-pay | Admitting: Cardiovascular Disease

## 2023-03-17 NOTE — Telephone Encounter (Signed)
Please reschedule F/U appointment for further refills. Patient did not show for last scheduled office visit. Thank you! 

## 2023-03-17 NOTE — Telephone Encounter (Signed)
LVM to schedule f/u appt

## 2023-04-15 ENCOUNTER — Other Ambulatory Visit: Payer: Self-pay | Admitting: Cardiovascular Disease

## 2023-04-16 ENCOUNTER — Other Ambulatory Visit: Payer: Self-pay

## 2023-04-16 NOTE — Telephone Encounter (Signed)
Please schedule overdue F/U appointment for further refills. Thank you! 

## 2023-04-30 NOTE — Telephone Encounter (Signed)
Left voicemail to call and schedule past due follow up appointment

## 2023-05-13 ENCOUNTER — Other Ambulatory Visit: Payer: Self-pay | Admitting: Cardiovascular Disease

## 2023-05-13 NOTE — Telephone Encounter (Signed)
last visit 08/07/23 with f/u plan 3-4 month(s) 08/06/22 lab result recommend repeat BMP not done.  NS visit on 11/06/22

## 2023-05-13 NOTE — Telephone Encounter (Signed)
Please call patient to schedule overdue f/u.  Thanks.

## 2023-05-16 ENCOUNTER — Other Ambulatory Visit: Payer: Self-pay | Admitting: Cardiovascular Disease

## 2023-05-16 ENCOUNTER — Other Ambulatory Visit: Payer: Self-pay | Admitting: Nurse Practitioner

## 2023-05-16 ENCOUNTER — Other Ambulatory Visit: Payer: Self-pay

## 2023-05-16 MED ORDER — ISOSORBIDE MONONITRATE ER 120 MG PO TB24: 120.0000 mg | ORAL_TABLET | Freq: Every day | ORAL | 0 refills | Status: DC

## 2023-05-16 MED ORDER — FUROSEMIDE 40 MG PO TABS: 40.0000 mg | ORAL_TABLET | ORAL | 0 refills | Status: DC

## 2023-05-25 NOTE — Patient Instructions (Signed)
Diabetes Mellitus Basics  Diabetes mellitus, or diabetes, is a long-term (chronic) disease. It occurs when the body does not properly use sugar (glucose) that is released from food after you eat. Diabetes mellitus may be caused by one or both of these problems: Your pancreas does not make enough of a hormone called insulin. Your body does not react in a normal way to the insulin that it makes. Insulin lets glucose enter cells in your body. This gives you energy. If you have diabetes, glucose cannot get into cells. This causes high blood glucose (hyperglycemia). How to treat and manage diabetes You may need to take insulin or other diabetes medicines daily to keep your glucose in balance. If you are prescribed insulin, you will learn how to give yourself insulin by injection. You may need to adjust the amount of insulin you take based on the foods that you eat. You will need to check your blood glucose levels using a glucose monitor as told by your health care provider. The readings can help determine if you have low or high blood glucose. Generally, you should have these blood glucose levels: Before meals (preprandial): 80-130 mg/dL (4.4-7.2 mmol/L). After meals (postprandial): below 180 mg/dL (10 mmol/L). Hemoglobin A1c (HbA1c) level: less than 7%. Your health care provider will set treatment goals for you. Keep all follow-up visits. This is important. Follow these instructions at home: Diabetes medicines Take your diabetes medicines every day as told by your health care provider. List your diabetes medicines here: Name of medicine: ______________________________ Amount (dose): _______________ Time (a.m./p.m.): _______________ Notes: ___________________________________ Name of medicine: ______________________________ Amount (dose): _______________ Time (a.m./p.m.): _______________ Notes: ___________________________________ Name of medicine: ______________________________ Amount (dose):  _______________ Time (a.m./p.m.): _______________ Notes: ___________________________________ Insulin If you use insulin, list the types of insulin you use here: Insulin type: ______________________________ Amount (dose): _______________ Time (a.m./p.m.): _______________Notes: ___________________________________ Insulin type: ______________________________ Amount (dose): _______________ Time (a.m./p.m.): _______________ Notes: ___________________________________ Insulin type: ______________________________ Amount (dose): _______________ Time (a.m./p.m.): _______________ Notes: ___________________________________ Insulin type: ______________________________ Amount (dose): _______________ Time (a.m./p.m.): _______________ Notes: ___________________________________ Insulin type: ______________________________ Amount (dose): _______________ Time (a.m./p.m.): _______________ Notes: ___________________________________ Managing blood glucose  Check your blood glucose levels using a glucose monitor as told by your health care provider. Write down the times that you check your glucose levels here: Time: _______________ Notes: ___________________________________ Time: _______________ Notes: ___________________________________ Time: _______________ Notes: ___________________________________ Time: _______________ Notes: ___________________________________ Time: _______________ Notes: ___________________________________ Time: _______________ Notes: ___________________________________  Low blood glucose Low blood glucose (hypoglycemia) is when glucose is at or below 70 mg/dL (3.9 mmol/L). Symptoms may include: Feeling: Hungry. Sweaty and clammy. Irritable or easily upset. Dizzy. Sleepy. Having: A fast heartbeat. A headache. A change in your vision. Numbness around the mouth, lips, or tongue. Having trouble with: Moving (coordination). Sleeping. Treating low blood glucose To treat low blood  glucose, eat or drink something containing sugar right away. If you can think clearly and swallow safely, follow the 15:15 rule: Take 15 grams of a fast-acting carb (carbohydrate), as told by your health care provider. Some fast-acting carbs are: Glucose tablets: take 3-4 tablets. Hard candy: eat 3-5 pieces. Fruit juice: drink 4 oz (120 mL). Regular (not diet) soda: drink 4-6 oz (120-180 mL). Honey or sugar: eat 1 Tbsp (15 mL). Check your blood glucose levels 15 minutes after you take the carb. If your glucose is still at or below 70 mg/dL (3.9 mmol/L), take 15 grams of a carb again. If your glucose does not go above 70 mg/dL (3.9 mmol/L) after   3 tries, get help right away. After your glucose goes back to normal, eat a meal or a snack within 1 hour. Treating very low blood glucose If your glucose is at or below 54 mg/dL (3 mmol/L), you have very low blood glucose (severe hypoglycemia). This is an emergency. Do not wait to see if the symptoms will go away. Get medical help right away. Call your local emergency services (911 in the [place] Do not drive yourself to the hospital. Questions to ask your health care provider Should I talk with a diabetes educator? What equipment will I need to care for myself at home? What diabetes medicines do I need? When should I take them? How often do I need to check my blood glucose levels? What number can I call if I have questions? When is my follow-up visit? Where can I find a support group for people with diabetes? Where to find more information American Diabetes Association: www.diabetes.org Association of Diabetes Care and Education Specialists: www.diabeteseducator.org Contact a health care provider if: Your blood glucose is at or above 240 mg/dL (13.3 mmol/L) for 2 days in a row. You have been sick or have had a fever for 2 days or more, and you are not getting better. You have any of these problems for more than 6 hours: You cannot eat or  drink. You feel nauseous. You vomit. You have diarrhea. Get help right away if: Your blood glucose is lower than 54 mg/dL (3 mmol/L). You get confused. You have trouble thinking clearly. You have trouble breathing. These symptoms may represent a serious problem that is an emergency. Do not wait to see if the symptoms will go away. Get medical help right away. Call your local emergency services (911 in the [place] Do not drive yourself to the hospital. Summary Diabetes mellitus is a chronic disease that occurs when the body does not properly use sugar (glucose) that is released from food after you eat. Take insulin and diabetes medicines as told. Check your blood glucose every day, as often as told. Keep all follow-up visits. This is important. This information is not intended to replace advice given to you by your health care provider. Make sure you discuss any questions you have with your health care provider. Document Revised: 03/28/2020 Document Reviewed: 03/28/2020 Elsevier Patient Education  2024 Elsevier Inc.  

## 2023-05-27 ENCOUNTER — Encounter: Payer: Self-pay | Admitting: Oncology

## 2023-05-28 ENCOUNTER — Ambulatory Visit: Payer: Medicare (Managed Care)

## 2023-05-28 ENCOUNTER — Other Ambulatory Visit: Payer: Self-pay | Admitting: Cardiovascular Disease

## 2023-05-28 ENCOUNTER — Ambulatory Visit: Payer: Medicare (Managed Care) | Attending: Nurse Practitioner | Admitting: Nurse Practitioner

## 2023-05-28 ENCOUNTER — Encounter: Payer: Self-pay | Admitting: Nurse Practitioner

## 2023-05-28 VITALS — BP 110/80 | HR 72 | Ht 65.0 in | Wt 289.2 lb

## 2023-05-28 DIAGNOSIS — E1165 Type 2 diabetes mellitus with hyperglycemia: Secondary | ICD-10-CM

## 2023-05-28 DIAGNOSIS — I10 Essential (primary) hypertension: Secondary | ICD-10-CM

## 2023-05-28 DIAGNOSIS — N183 Chronic kidney disease, stage 3 unspecified: Secondary | ICD-10-CM

## 2023-05-28 DIAGNOSIS — R42 Dizziness and giddiness: Secondary | ICD-10-CM | POA: Diagnosis not present

## 2023-05-28 DIAGNOSIS — I5032 Chronic diastolic (congestive) heart failure: Secondary | ICD-10-CM | POA: Diagnosis not present

## 2023-05-28 DIAGNOSIS — E785 Hyperlipidemia, unspecified: Secondary | ICD-10-CM

## 2023-05-28 DIAGNOSIS — I25118 Atherosclerotic heart disease of native coronary artery with other forms of angina pectoris: Secondary | ICD-10-CM

## 2023-05-28 DIAGNOSIS — I255 Ischemic cardiomyopathy: Secondary | ICD-10-CM

## 2023-05-28 DIAGNOSIS — E875 Hyperkalemia: Secondary | ICD-10-CM

## 2023-05-28 DIAGNOSIS — Z794 Long term (current) use of insulin: Secondary | ICD-10-CM

## 2023-05-28 MED ORDER — NITROGLYCERIN 0.4 MG SL SUBL: SUBLINGUAL_TABLET | SUBLINGUAL | 3 refills | Status: AC

## 2023-05-28 NOTE — Patient Instructions (Signed)
Medication Instructions:  Your physician recommends that you continue on your current medications as directed. Please refer to the Current Medication list given to you today.  *If you need a refill on your cardiac medications before your next appointment, please call your pharmacy*   Lab Work: none If you have labs (blood work) drawn today and your tests are completely normal, you will receive your results only by: MyChart Message (if you have MyChart) OR A paper copy in the mail If you have any lab test that is abnormal or we need to change your treatment, we will call you to review the results.   Testing/Procedures: Your physician has recommended that you wear a Zio monitor for 7 days.   This monitor is a medical device that records the heart's electrical activity. Doctors most often use these monitors to diagnose arrhythmias. Arrhythmias are problems with the speed or rhythm of the heartbeat. The monitor is a small device applied to your chest. You can wear one while you do your normal daily activities. While wearing this monitor if you have any symptoms to push the button and record what you felt. Once you have worn this monitor for the period of time provider prescribed 7 days, you will return the monitor device in the postage paid box. Once it is returned they will download the data collected and provide [place] with a report which the provider will then review and we will call you with those results. Important tips:  Avoid showering during the first 24 hours of wearing the monitor. Avoid excessive sweating to help maximize wear time. Do not submerge the device, no hot tubs, and no swimming pools. Keep any lotions or oils away from the patch. After 24 hours you may shower with the patch on. Take brief showers with your back facing the shower head.  Do not remove patch once it has been placed because that will interrupt data and decrease adhesive wear time. Push the button when you have any  symptoms and write down what you were feeling. Once you have completed wearing your monitor, remove and place into box which has postage paid and place in your outgoing mailbox.  If for some reason you have misplaced your box then call our office and we can provide another box and/or mail it off for you.      Follow-Up: At [facility], you and your health needs are our priority.  As part of our continuing mission to provide you with exceptional heart care, we have created designated Provider Care Teams.  These Care Teams include your primary Cardiologist (physician) and Advanced Practice Providers (APPs -  Physician Assistants and Nurse Practitioners) who all work together to provide you with the care you need, when you need it.  We recommend signing up for the patient portal called "MyChart".  Sign up information is provided on this After Visit Summary.  MyChart is used to connect with patients for Virtual Visits (Telemedicine).  Patients are able to view lab/test results, encounter notes, upcoming appointments, etc.  Non-urgent messages can be sent to your provider as well.   To learn more about what you can do with MyChart, go to [email or web address].    Your next appointment:   1 month(s)  Provider:   Lorine Bears, MD or Nicolasa Ducking, NP

## 2023-05-28 NOTE — Progress Notes (Signed)
Office Visit    Patient Name: [name] [name] Date of Encounter: 05/28/2023  Primary Care Provider:  Marjie Skiff, NP Primary Cardiologist:  Lorine Bears, MD  Chief Complaint    42 y.o. 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  Past Medical History    Past Medical History:  Diagnosis Date   CAD (coronary artery disease)    a. 03/2015 NSTEMI/PCI: LCX 135m (DES), OM1 100p (DES - vessel labeled Ramus in subsequent cath report); b. 04/2015 Cath: patent stents/stable anatomy; c. 02/2016 MV: No isch; d. 11/2018 PCI: LCX 95p/m (PTCA->20%); e. 01/2019 PCI: RCA 85p/m (2.5x32 Synergy DES); f. 01/2021 Cath: LM 30, LAD 40ost/p, 21m/d, LCX 55p restenosis, OM1 10, OM2 40, RCA patent stent-->Med rx.   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 03/2015 Echo: EF 30-35%, mild concentric LVH, severe HK of inf, inflat, & lat walls, mod MR, mild TR;  b. 04/2015 LV Gram: EF 55-65%; c. 09/2017 Echo: EF 55-60%, no rwma. Mild MR; d. 11/2018 Echo: Ef 60-65%, no rwma; e. 06/2022 Echo: EF 60-65%, nl RV fxn, mildly dil LA, mild MR.   CKD (chronic kidney disease), stage IV (HCC)    Diabetes type 2, controlled (HCC)    a. since 2002    Diabetic gastroparesis (HCC)    a. chronic nausea/vomiting.   Diabetic retinopathy (HCC)    Diverticulosis, sigmoid 07/2016   Fatty liver    GERD (gastroesophageal reflux disease)    Heavy menses    a. H/O IUD - expired in 2014 - remains in place.   Hx of migraines    Hyperkalemia    a. On chronic lokelma - managed by nephrology.   Hyperlipidemia    Intolerant of atorvastatin, rosuvastatin   Hypertension    Iron deficiency  anemia    Ischemic cardiomyopathy (resolved)    a. 03/2015 EF 30-35% post NSTEMI;  b. 04/2015 EF 55-65% on LV gram; c. 09/2017 Echo: EF 55-60%; d. 11/2018 Echo: Ef 60-65%; e. 06/2022 Echo: EF 60-65%.   Obesity    Tobacco abuse    a. quit 03/2015.   Uterine fibroid    Vertigo    Vitamin D deficiency    Past Surgical History:  Procedure Laterality Date   APPENDECTOMY     CARDIAC CATHETERIZATION  03/2015   x2 stent [facility]   CARDIAC CATHETERIZATION N/A 05/05/2015   Procedure: Left Heart Cath and Coronary Angiography;  Surgeon: Peter M Swaziland, MD;  Location: [facility] INVASIVE CV LAB;  Service: Cardiovascular;  Laterality: N/A;   CATARACT EXTRACTION Left 09/12/2021   CATARACT EXTRACTION Right 10/10/2021   COLONOSCOPY WITH PROPOFOL N/A 07/31/2016   Procedure: COLONOSCOPY WITH PROPOFOL;  Surgeon: Christena Deem, MD;  Location: [facility] ENDOSCOPY;  Service: Endoscopy;  Laterality: N/A;   CORONARY BALLOON ANGIOPLASTY N/A 12/04/2018   Procedure: CORONARY BALLOON ANGIOPLASTY;  Surgeon: Iran Ouch, MD;  Location: [facility] INVASIVE CV LAB;  Service: Cardiovascular;  Laterality: N/A;   CORONARY PRESSURE/FFR STUDY N/A 12/04/2018   Procedure: INTRAVASCULAR PRESSURE WIRE/FFR STUDY;  Surgeon: Iran Ouch, MD;  Location: [facility] INVASIVE CV LAB;  Service: Cardiovascular;  Laterality: N/A;   CORONARY STENT INTERVENTION N/A 01/12/2019   Procedure: CORONARY STENT INTERVENTION;  Surgeon:  End, Cristal Deer, MD;  Location: [facility] INVASIVE CV LAB;  Service: Cardiovascular;  Laterality: N/A;   ESOPHAGOGASTRODUODENOSCOPY (EGD) WITH PROPOFOL N/A 07/31/2016   Procedure: ESOPHAGOGASTRODUODENOSCOPY (EGD) WITH PROPOFOL;  Surgeon: Christena Deem, MD;  Location: [facility] ENDOSCOPY;  Service: Endoscopy;  Laterality: N/A;   EYE SURGERY  07/16/2022   Membrane peeled   LAPAROSCOPIC APPENDECTOMY N/A 08/07/2016   Procedure: APPENDECTOMY LAPAROSCOPIC;  Surgeon: Gladis Riffle, MD;  Location: [facility] ORS;  Service: General;  Laterality:  N/A;   LEFT HEART CATH AND CORONARY ANGIOGRAPHY Left 12/04/2018   Procedure: LEFT HEART CATH AND CORONARY ANGIOGRAPHY;  Surgeon: Iran Ouch, MD;  Location: [facility] INVASIVE CV LAB;  Service: Cardiovascular;  Laterality: Left;   LEFT HEART CATH AND CORONARY ANGIOGRAPHY N/A 01/12/2019   Procedure: LEFT HEART CATH AND CORONARY ANGIOGRAPHY;  Surgeon: Yvonne Kendall, MD;  Location: [facility] INVASIVE CV LAB;  Service: Cardiovascular;  Laterality: N/A;   MOUTH SURGERY     RIGHT/LEFT HEART CATH AND CORONARY ANGIOGRAPHY N/A 01/29/2021   Procedure: RIGHT/LEFT HEART CATH AND CORONARY ANGIOGRAPHY;  Surgeon: Iran Ouch, MD;  Location: [facility] INVASIVE CV LAB;  Service: Cardiovascular;  Laterality: N/A;   VITRECTOMY Left 10/30/2021    Allergies  Allergies  Allergen Reactions   Lisinopril Anaphylaxis, Itching and Swelling   Other Itching and Swelling    [place] seasoning    Rosemary Oil Anaphylaxis   Shellfish Allergy Itching and Swelling    Pt states that she HAS had IV Contrast without any reaction   Statins Other (See Comments)    Myalgias   Metformin And Related Diarrhea, Nausea And Vomiting and Rash    History of Present Illness      42 y.o. 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 Cardiac history dates back to April 2016, when she was hospitalized with non-STEMI. She was found to have LV dysfunction with an EF of 30 to 35%. Diagnostic catheterization showed severe left circumflex and OM1 disease. Both areas were successfully treated with drug-eluting stents. Subsequent catheterization in May 2016, showed patency of both stents and normalization of LV function by ventriculography. She had nonischemic stress testing in March 2017, but in the  setting of recurrent angina in December 2019, she underwent diagnostic catheterization, which showed patent OM1 stent with focal in-stent restenosis in the left circumflex at the origin of the OM 2, and new significant stenosis in the proximal to mid right coronary artery. Balloon angioplasty was performed in the mid left circumflex and she subsequently underwent staged PCI of the RCA in early 2020. Echo in December 2019, showed an EF of 60 to 65%. In February 2022, she underwent right and left heart cardiac catheterization in the setting of chest pain and dyspnea. This showed patent stents in the RCA, left circumflex, and OM1. There was moderate restenosis in the mid left circumflex at the site of prior PTCA. She was medically managed.  Echocardiogram in July showed an EF of 60 to 65% with normal RV function, mildly dilated left atrium, and mild mitral regurgitation.    Ms. Hofacker required escalation of lasix dose in 05/2022, due to inc abd girth and lower ext edema.  This resulted in creat bump to 2.34, which improved slightly after reducing lasix back to 40mg  daily.  She was stable @ cardiology f/u on 07/28/2022.  Creat subsequently returned to baseline though potassium was elevated at 6.2 in September.  She was advised to resume Lokelma and have follow-up labs in a week however, she missed both follow-up labs and follow-up appointment.  Clinically, she feels that she has mostly done well from a cardiac standpoint.  She does not experience angina, dyspnea, or significant edema.  She is disappointed that she has not been able to lose weight despite adjusting her diet.  Over the past month or so, she has had intermittent lightheadedness.  This might occur while sitting and doing nothing and also with position changes.  Symptoms typically last just a few seconds and resolve spontaneously.  Sometimes they are associated with palpitations but not all the time.  Over the past week, she was also noted intermittent sharp  and fleeting/stabbing left-sided chest pain that lasts a second and resolve spontaneously but will come back multiple times over a period of about 5 minutes.  This is sometimes associated with sharp pain in her left arm and or pain and tightness in the right side of her neck.  She notes that the symptoms are not at all similar to prior angina and are very much in line with what she has experienced in the past with anxiety attacks.  She denies PND, orthopnea, syncope, edema, or early satiety.  Home Medications    Current Outpatient Medications  Medication Sig Dispense Refill   acetaminophen (TYLENOL) 500 MG tablet Take 1,000 mg by mouth every 4 (four) hours as needed.     Alirocumab (PRALUENT) 150 MG/ML SOAJ INJECT 1 ML INTO THE SKIN EVERY 14 DAYS 2 mL 11   allopurinol (ZYLOPRIM) 100 MG tablet Take 0.5 tablets (50 mg total) by mouth daily. 45 tablet 4   aspirin EC 81 MG tablet Take 81 mg by mouth daily.     benzonatate (TESSALON PERLES) 100 MG capsule Take 1 capsule (100 mg total) by mouth 3 (three) times daily as needed for cough. 45 capsule 0   carvedilol (COREG) 12.5 MG tablet TAKE 1 TABLET BY MOUTH TWICE DAILY . APPOINTMENT REQUIRED FOR FUTURE REFILLS 30 tablet 0   clopidogrel (PLAVIX) 75 MG tablet Take 1 tablet (75 mg total) by mouth daily. PLEASE CALL [phone] TO SCHEDULE AN APPOINTMENT FOR FURTHER REFILLS. THANK YOU. 1ST ATTEMPT 30 tablet 0   Continuous Blood Gluc Transmit (DEXCOM G6 TRANSMITTER) MISC      EQ ALLERGY RELIEF, CETIRIZINE, 10 MG tablet Take 10 mg by mouth daily as needed.     etonogestrel (NEXPLANON) 68 MG IMPL implant 1 each (68 mg total) by Subdermal route once for 1 dose. 1 each 0   famotidine (PEPCID) 40 MG tablet Take 1 tablet (40 mg total) by mouth daily. 90 tablet 4   furosemide (LASIX) 20 MG tablet Take 20 mg by mouth daily.     insulin aspart (NOVOLOG) 100 UNIT/ML injection Inject 10 Units into the skin 3 (three) times daily with meals. 10 mL 11   Insulin Glargine  (BASAGLAR KWIKPEN) 100 UNIT/ML Inject 70 Units into the skin daily.     irbesartan (AVAPRO) 150 MG tablet Take 75 mg by mouth daily.     isosorbide mononitrate (IMDUR) 120 MG 24 hr tablet Take 1 tablet (120 mg total) by mouth daily. PLEASE CALL [phone] TO SCHEDULE AN APPOINTMENT AND FOR FURTHER REFILLS. THANK YOU. 1ST ATTEMPT. 30 tablet 0   LOKELMA 10 g PACK packet Take 10 g by mouth daily.     pantoprazole (PROTONIX) 40  MG tablet Take 1 tablet by mouth twice daily 180 tablet 4   XHANCE 93 MCG/ACT EXHU Place 1 spray into both nostrils 2 (two) times daily.     ACCU-CHEK AVIVA PLUS test strip USE 1 STRIP TO CHECK GLUCOSE THREE TIMES DAILY.  DX E11.29 (Patient not taking: Reported on 05/28/2023) 100 each 12   BD PEN NEEDLE MICRO U/F 32G X 6 MM MISC Inject into the skin. (Patient not taking: Reported on 05/28/2023)     Continuous Blood Gluc Receiver (DEXCOM G6 RECEIVER) DEVI  (Patient not taking: Reported on 05/28/2023)     Continuous Blood Gluc Sensor (DEXCOM G6 SENSOR) MISC See admin instructions. (Patient not taking: Reported on 05/28/2023)     EPINEPHrine 0.3 mg/0.3 mL IJ SOAJ injection See admin instructions. (Patient not taking: Reported on 05/28/2023)     ezetimibe (ZETIA) 10 MG tablet Take 1 tablet by mouth once daily 90 tablet 3   loperamide (IMODIUM) 2 MG capsule Take 2 mg by mouth as needed for diarrhea or loose stools. (Patient not taking: Reported on 05/28/2023)     nitroGLYCERIN (NITROSTAT) 0.4 MG SL tablet Take 1 tab under your tongue while sitting for chest pain, If no relief may repeat, one tab every 5 min up to 3 tabs total over 15 mins. 25 tablet 3   No current facility-administered medications for this visit.   Facility-Administered Medications Ordered in Other Visits  Medication Dose Route Frequency Provider Last Rate Last Admin   sodium chloride flush (NS) 0.9 % injection 3 mL  3 mL Intravenous Q12H Visser, Jacquelyn D, PA-C         Review of Systems    Lightheadedness and  sharp/fleeting chest pain as outlined above.  Occasional palpitation.  She denies dyspnea, PND, orthopnea, syncope, edema, or early satiety.  All other systems reviewed and are otherwise negative except as noted above.    Physical Exam    VS:  BP 110/80 (BP Location: Left Arm, Patient Position: Sitting, Cuff Size: Large)   Pulse 72   Ht 5\' 5"  (1.651 m)   Wt 289 lb 4 oz (131.2 kg)   SpO2 95%   BMI 48.13 kg/m  , BMI Body mass index is 48.13 kg/m.    Orthostatic VS for the past 24 hrs:  BP- Lying Pulse- Lying BP- Sitting Pulse- Sitting BP- Standing at 0 minutes Pulse- Standing at 0 minutes  05/28/23 1010 139/80 74 146/85 76 110/71 81     GEN: Obese, in no acute distress. HEENT: normal. Neck: Supple, obese, difficult to gauge JVP.  No bruits or masses.   Cardiac: RRR, no murmurs, rubs, or gallops. No clubbing, cyanosis, edema.  Radials 2+/PT 2+ and equal bilaterally.  Respiratory:  Respirations regular and unlabored, clear to auscultation bilaterally. GI: Obese, soft, nontender, nondistended, BS + x 4. MS: no deformity or atrophy. Skin: warm and dry, no rash. Neuro:  Strength and sensation are intact. Psych: Normal affect.  Accessory Clinical Findings    ECG personally reviewed by me today -regular sinus rhythm, 72- no acute changes.  Lab Results  Component Value Date   WBC 7.3 08/06/2021   HGB 11.0 (L) 08/06/2021   HCT 34.9 08/06/2021   MCV 86 08/06/2021   PLT 366 08/06/2021   Lab Results  Component Value Date   CREATININE 1.85 (H) 08/06/2022   BUN 46 (H) 08/06/2022   NA 138 08/06/2022   K 6.2 (H) 08/06/2022   CL 112 (H) 08/06/2022   CO2 23  08/06/2022   Lab Results  Component Value Date   ALT 18 10/22/2021   AST 13 10/22/2021   GGT 487 (H) 06/03/2016   ALKPHOS 73 10/22/2021   BILITOT <0.2 10/22/2021   Lab Results  Component Value Date   CHOL 113 05/21/2022   HDL 47 05/21/2022   LDLCALC 52 05/21/2022   LDLDIRECT 163 (H) 12/17/2018   TRIG 65 05/21/2022    CHOLHDL 4.2 10/22/2021    Lab Results  Component Value Date   HGBA1C 9.0 12/15/2019   Labs from care everywhere dated October 29, 2022:  Sodium 138, potassium 5.1, chloride 103, CO2 27, BUN 49, creatinine 2.24, glucose 131 Calcium 9.3, phosphorus 4.6, albumin 3.9  Assessment & Plan    1.  Precordial chest pain/CAD: Status post prior stenting of left circumflex and OM1 in April 2016 with PTCA of the left circumflex December 2019, drug-eluting stent placement to the RCA in February 2020.  Most recent cath in February 2022 showed 55% proximal restenosis in the left circumflex (PTCA site), but otherwise stable anatomy.  Over the past several days, she has been experiencing intermittent sharp and fleeting left chest pain occasionally moving to her left arm or right neck.  There are no associated symptoms.  Symptoms last about a second or 2 and then resolve spontaneously before returning with an episode of symptoms lasting several minutes at a time.  She notes that symptoms are very different than prior angina, which occurred in a more predictable pattern, was heavier in nature, and generally associated with dyspnea.  She feels that the current symptoms represent anxiety.  We discussed options for evaluation.  Her body habitus makes nonischemic evaluation challenging and we agreed that we prefer to avoid diagnostic catheterization in light of atypical symptoms and chronic kidney disease.  We will continue current medical regimen including aspirin, Plavix, beta-blocker, nitrate, Zetia, and PCSK9 inhibitor.  Patient will contact us for any progression of symptoms.  2.  Lightheadedness/orthostasis/palpitations: Over the past month or more, patient has had intermittent lightheaded spells.  Some of these are predictable and associated with position changes though others occur randomly while doing nothing.  Sometimes symptoms are associated with palpitations.  On vital signs evaluation today, she did drop her  blood pressure from 146/85 while sitting to 110/71 after immediately standing with only a slight rise in heart rate.  She was symptomatic with position change.  Subsequent blood pressure returned to baseline.  With her history of resting hypertension, and only mild orthostasis, we agreed to hold off on adjusting her medications at this time.  She is due for follow-up lab work tomorrow through her primary care provider's office and this should be helpful in evaluating anemia or worsening renal function/dehydration.  Encouraged her to ensure adequate hydration and to be delivered with position changes.  Given symptoms that occur in the absence of position changes, we did agree to place a 7-day ZIO monitor to assess for arrhythmias.  3.  Chronic HFimpEF/ICM: EF previously as low as 30-35% by echo in April 2016, with subsequent improvement following PCI and most recent echo in July 2023 showing an EF of 60 to 65%.  She has been doing well from a heart failure standpoint.  Though her weight is up overall, her volume status appears stable.  Heart rate and blood pressure stable.  She remains on carvedilol, irbesartan, and Lasix 20 mg daily.  She is due for follow-up blood work through her primary care provider's office tomorrow.  Previously  did not tolerate GLP-1 agonist (endocrinology note indicates that she had nausea on Ozempic).  Poor candidate for SGLT2 inhibitor due to renal dysfunction.  4.  Essential hypertension: See above regarding orthostasis.  Continue current doses of medications.  Labs due tomorrow.  More deliberate position changes.  5.  Stage III-IV chronic kidney disease: She is followed by nephrology with a creatinine of 2.24 in November of last year.  Currently on ARB therapy in the setting of prior history of hyperkalemia.  Due for follow-up labs tomorrow.    6.  Hyperkalemia: On Lokelma 10 mg daily.  Also taking irbesartan 75 mg daily.  As above, plan for follow-up labs through primary care  office tomorrow.  7.  Type 2 diabetes mellitus: A1c of 8.2 in October 2023.  Insulin management per primary team.  8.  Disposition: Follow-up with 7-day ZIO monitor.  Follow-up in clinic in 4 to 6 weeks.  Nicolasa Ducking, NP 05/28/2023, 12:29 PM

## 2023-05-28 NOTE — Telephone Encounter (Signed)
Patient has appointment at 10:50 AM this morning. Please refill if appropriate. Thank you!

## 2023-05-29 ENCOUNTER — Ambulatory Visit (INDEPENDENT_AMBULATORY_CARE_PROVIDER_SITE_OTHER): Payer: Medicare (Managed Care) | Admitting: Nurse Practitioner

## 2023-05-29 VITALS — BP 138/82 | HR 72 | Temp 98.0°F | Ht 65.0 in | Wt 292.2 lb

## 2023-05-29 DIAGNOSIS — I255 Ischemic cardiomyopathy: Secondary | ICD-10-CM

## 2023-05-29 DIAGNOSIS — K760 Fatty (change of) liver, not elsewhere classified: Secondary | ICD-10-CM

## 2023-05-29 DIAGNOSIS — E1129 Type 2 diabetes mellitus with other diabetic kidney complication: Secondary | ICD-10-CM | POA: Diagnosis not present

## 2023-05-29 DIAGNOSIS — E66813 Obesity, class 3: Secondary | ICD-10-CM

## 2023-05-29 DIAGNOSIS — M109 Gout, unspecified: Secondary | ICD-10-CM

## 2023-05-29 DIAGNOSIS — E1169 Type 2 diabetes mellitus with other specified complication: Secondary | ICD-10-CM

## 2023-05-29 DIAGNOSIS — N2581 Secondary hyperparathyroidism of renal origin: Secondary | ICD-10-CM

## 2023-05-29 DIAGNOSIS — E875 Hyperkalemia: Secondary | ICD-10-CM

## 2023-05-29 DIAGNOSIS — E1143 Type 2 diabetes mellitus with diabetic autonomic (poly)neuropathy: Secondary | ICD-10-CM

## 2023-05-29 DIAGNOSIS — I25118 Atherosclerotic heart disease of native coronary artery with other forms of angina pectoris: Secondary | ICD-10-CM

## 2023-05-29 DIAGNOSIS — Z794 Long term (current) use of insulin: Secondary | ICD-10-CM

## 2023-05-29 DIAGNOSIS — E1122 Type 2 diabetes mellitus with diabetic chronic kidney disease: Secondary | ICD-10-CM

## 2023-05-29 DIAGNOSIS — I130 Hypertensive heart and chronic kidney disease with heart failure and stage 1 through stage 4 chronic kidney disease, or unspecified chronic kidney disease: Secondary | ICD-10-CM

## 2023-05-29 DIAGNOSIS — G4733 Obstructive sleep apnea (adult) (pediatric): Secondary | ICD-10-CM

## 2023-05-29 DIAGNOSIS — N184 Chronic kidney disease, stage 4 (severe): Secondary | ICD-10-CM

## 2023-05-29 DIAGNOSIS — I5032 Chronic diastolic (congestive) heart failure: Secondary | ICD-10-CM

## 2023-05-29 DIAGNOSIS — I700 Atherosclerosis of aorta: Secondary | ICD-10-CM

## 2023-05-29 DIAGNOSIS — E559 Vitamin D deficiency, unspecified: Secondary | ICD-10-CM

## 2023-05-29 DIAGNOSIS — N183 Chronic kidney disease, stage 3 unspecified: Secondary | ICD-10-CM

## 2023-05-29 LAB — MICROALBUMIN, URINE WAIVED
Creatinine, Urine Waived: 50 mg/dL (ref 10–300)
Microalb, Ur Waived: 150 mg/L — ABNORMAL HIGH (ref 0–19)
Microalb/Creat Ratio: >300 mg/g — ABNORMAL HIGH (ref <30)

## 2023-05-29 LAB — BAYER DCA HB A1C WAIVED: HB A1C (BAYER DCA - WAIVED): 7.6 % — ABNORMAL HIGH (ref 4.8–5.6)

## 2023-05-29 NOTE — Assessment & Plan Note (Signed)
Chronic, ongoing.  Followed by cardiology, continue this collaboration.  Recent notes reviewed. 

## 2023-05-29 NOTE — Assessment & Plan Note (Signed)
Chronic, ongoing, followed by nephrology.  Recent notes and labs reviewed.  Continue current medications as prescribed by them.  BMP today, urine ALB 150 June 2024.  Continue focus on vegan style diet.  A1c today 7.6%, downward trend, praised for this.  Continue collaboration with nephrology and endocrinology, she will discuss GLP1 with them. - Cholesterol lowering medication and ARB on board - Eye and foot exam up to date - Vaccinations up to date

## 2023-05-29 NOTE — Assessment & Plan Note (Signed)
Chronic, ongoing.  Continue supplement daily and check level today.

## 2023-05-29 NOTE — Assessment & Plan Note (Signed)
Chronic, ongoing with poor tolerance to statins.  Followed by cardiology.  Continue Praluent and Zetia, as is intolerant to statins.  Lipid panel today.

## 2023-05-29 NOTE — Progress Notes (Signed)
BP 138/82 (BP Location: Left Arm, Patient Position: Sitting, Cuff Size: Normal)   Pulse 72   Temp 98 F (36.7 C) (Oral)   Ht 5\' 5"  (1.651 m)   Wt 292 lb 3.2 oz (132.5 kg)   BMI 48.62 kg/m    Subjective:    Patient ID: [name] [name], female    DOB: [birth year], 42 y.o.   MRN: [id number]  HPI: [name] [name] is a 42 y.o. female  Chief Complaint  Patient presents with   Diabetes   Hypertension   Hyperlipidemia   Chronic Kidney Disease   Gastroesophageal Reflux   Vit D   DIABETES WITH GASTROPARESIS Followed by endocrinology, A1c 8.1% at visit with them on 01/08/23 -- returns on 06/23/23.  Basaglar taking 100 units daily and Novolog sliding scale.   Hypoglycemic episodes:no Polydipsia/polyuria: no Visual disturbance: no Chest pain: no Paresthesias: no Glucose Monitoring: yes  Accucheck frequency: BID  Fasting glucose: have been elevated a bit recently, but Dexcom not working correctly  Post prandial:  Evening: varies  Before meals: Taking Insulin?: yes  Long acting insulin: 100 Tresiba  Short acting insulin: sliding scale Blood Pressure Monitoring: daily Retinal Examination: Up to Date -- November 2023 Foot Exam: Up to Date Pneumovax: Up to Date Influenza: Up to Date Aspirin: yes   HYPERTENSION / HYPERLIPIDEMIA/HF/AORTIC ATHEROSCLEROSIS Followed by cardiology and last saw 05/29/23.  No recent NTG use.  Last echo in July 2023 showed EF 60-65%.  Currently has Zio monitor in place.  Uses CPAP every night. Satisfied with current treatment? yes Duration of hypertension: chronic BP monitoring frequency: daily BP range: 110-130/80's -- past few days has been higher due to drinking more iced coffee BP medication side effects: no Duration of hyperlipidemia: chronic Cholesterol supplements: none Medication compliance: good compliance Aspirin: yes Recent stressors: no Recurrent headaches: no Visual changes: no Palpitations: occasional Dyspnea: no Chest pain: yes --  saw cardiology yesterday, she thinks it is related to increased stressors, feels like when she had panic attacks Lower extremity edema: mild Dizzy/lightheaded: occasional  CHRONIC KIDNEY DISEASE Is followed by nephrology for CKD and last saw 04/02/22 with recent labs 10/29/22 CRT 2.24, eGFR 27, PTH 202, K+ 5.1.  Continues on Allopurinol for gout. CKD status: stable Medications renally dose: yes Previous renal evaluation: yes Pneumovax:  Up to Date Influenza Vaccine:  Up to Date   GERD Previously was being followed by GI (last saw [facility] 08/06/2019), having less issues as stopped eating red meat or pork, with exception of fish/turkey/chicken, and had gone to more vegan diet.  Uses Dexcom.  Has never had testing for Alpha Gal.  Continues on Protonix and Pepcid. GERD control status: stable Satisfied with current treatment? yes Heartburn frequency: occasional, not as frequent Medication side effects: no  Medication compliance: stable Previous GERD medications: Antacid use frequency:  none Dysphagia: no Odynophagia:  no Hematemesis: no Blood in stool: no EGD: yes      05/29/2023    8:54 AM 02/19/2023    2:57 PM 11/27/2022    9:37 AM 05/21/2022    9:36 AM 02/20/2022   10:55 AM  Depression screen PHQ 2/9  Decreased Interest 0 0 0 0 0  Down, Depressed, Hopeless 0 0 0 0 0  PHQ - 2 Score 0 0 0 0 0  Altered sleeping 0 0 0 0 0  Tired, decreased energy 2 2 0 0 0  Change in appetite 0 0 0 0 0  Feeling bad or failure about yourself  0 0 0 0 0  Trouble concentrating 0 0 0 0 0  Moving slowly or fidgety/restless 0 0 0 0 0  Suicidal thoughts 0 0 0 0 0  PHQ-9 Score 2 2 0 0 0  Difficult doing work/chores Somewhat difficult Somewhat difficult Not difficult at all Not difficult at all        05/29/2023    8:55 AM 02/19/2023    2:57 PM 11/27/2022    9:37 AM 05/21/2022    9:36 AM  GAD 7 : Generalized Anxiety Score  Nervous, Anxious, on Edge 0 0 0 0  Control/stop worrying 0 0 0 0  Worry too much -  different things 0 0 0 0  Trouble relaxing 0 0 0 0  Restless 0 0 3 0  Easily annoyed or irritable 0 1 0 0  Afraid - awful might happen 0 0 0 0  Total GAD 7 Score 0 1 3 0  Anxiety Difficulty Not difficult at all Not difficult at all  Not difficult at all     Relevant past medical, surgical, family and social history reviewed and updated as indicated. Interim medical history since our last visit reviewed. Allergies and medications reviewed and updated.  Review of Systems  Constitutional:  Negative for activity change, appetite change, diaphoresis, fatigue and fever.  Respiratory:  Negative for cough, chest tightness and shortness of breath.   Cardiovascular:  Negative for chest pain, palpitations and leg swelling.  Gastrointestinal:  Negative for abdominal distention, abdominal pain, constipation, diarrhea, nausea and vomiting.  Endocrine: Negative for cold intolerance, heat intolerance, polydipsia, polyphagia and polyuria.  Neurological:  Negative for dizziness, syncope, weakness, light-headedness, numbness and headaches.  Psychiatric/Behavioral: Negative.      Per HPI unless specifically indicated above     Objective:    BP 138/82 (BP Location: Left Arm, Patient Position: Sitting, Cuff Size: Normal)   Pulse 72   Temp 98 F (36.7 C) (Oral)   Ht 5\' 5"  (1.651 m)   Wt 292 lb 3.2 oz (132.5 kg)   BMI 48.62 kg/m   Wt Readings from Last 3 Encounters:  05/29/23 292 lb 3.2 oz (132.5 kg)  05/28/23 289 lb 4 oz (131.2 kg)  02/19/23 286 lb 14.4 oz (130.1 kg)    Physical Exam Vitals and nursing note reviewed.  Constitutional:      General: She is awake. She is not in acute distress.    Appearance: She is well-developed and well-groomed. She is obese. She is not ill-appearing or toxic-appearing.  HENT:     Head: Normocephalic.     Right Ear: Hearing normal.     Left Ear: Hearing normal.  Eyes:     General: Lids are normal.        Right eye: No discharge.        Left eye: No  discharge.     Conjunctiva/sclera: Conjunctivae normal.     Pupils: Pupils are equal, round, and reactive to light.  Neck:     Thyroid: No thyromegaly.     Vascular: No carotid bruit.  Cardiovascular:     Rate and Rhythm: Normal rate and regular rhythm.     Heart sounds: Normal heart sounds. No murmur heard.    No gallop.  Pulmonary:     Effort: Pulmonary effort is normal. No accessory muscle usage or respiratory distress.     Breath sounds: Normal breath sounds.  Abdominal:     General: Bowel sounds are normal. There is no distension.  Palpations: Abdomen is soft.     Tenderness: There is no abdominal tenderness.  Musculoskeletal:     Cervical back: Normal range of motion and neck supple.     Right lower leg: Edema (trace) present.     Left lower leg: Edema (trace) present.  Skin:    General: Skin is warm and dry.  Neurological:     Mental Status: She is alert and oriented to person, place, and time.  Psychiatric:        Attention and Perception: Attention normal.        Mood and Affect: Mood normal.        Speech: Speech normal.        Behavior: Behavior normal. Behavior is cooperative.        Thought Content: Thought content normal.     Results for orders placed or performed in visit on 05/29/23  Bayer DCA Hb A1c Waived  Result Value Ref Range   HB A1C (BAYER DCA - WAIVED) 7.6 (H) 4.8 - 5.6 %  Microalbumin, Urine Waived  Result Value Ref Range   Microalb, Ur Waived 150 (H) 0 - 19 mg/L   Creatinine, Urine Waived 50 10 - 300 mg/dL   Microalb/Creat Ratio >300 (H) <30 mg/g      Assessment & Plan:   Problem List Items Addressed This Visit       Cardiovascular and Mediastinum   Chronic diastolic heart failure (HCC) (Chronic)    Chronic, ongoing, followed by cardiology.  Euvolemic today.  Continue current medication regimen as prescribed by them.  Recommend: - Reminded to call for an overnight weight gain of >2 pounds or a weekly weight gain of >5 pounds - not  adding salt to food and read food labels. Reviewed the importance of keeping daily sodium intake to 2000mg  daily - Avoid all NSAID products      Atherosclerosis of aorta (HCC)    Noted on past imaging.  Continue Plavix and Praluent for prevention.      Coronary artery disease of native artery of native heart with stable angina pectoris (HCC)    Chronic, stable, followed by cardiology.  Continue this collaboration and medication regimen as prescribed by them.      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  BP trending down today on recheck, recommend continue cut back on iced coffee.  Recommend she monitor BP at least a few mornings a week at home and document.  DASH diet at home.  Continue current medication regimen and adjust as needed -- continue collaboration with cardiology.  Labs today: CBC, CMP, TSH, urine ALB.  Urine ALB 150 June 2024.         Relevant Orders   TSH   Ischemic cardiomyopathy    Chronic, ongoing.  Followed by cardiology, continue this collaboration.  Recent notes reviewed.        Respiratory   Obstructive sleep apnea    Chronic, ongoing. Continue use of CPAP nightly.        Digestive   Diabetic gastroparesis associated with type 2 diabetes mellitus (HCC)    Stable, no further discomfort.  Continue current diabetic regimen as ordered by endo and collaboration with both GI and endocrinology.  Continue to collaborate with CCM team as needed. Check Alpha Gal next visit as symptoms have improved with going to vegan diet with no red meat or pork.      Relevant Orders  Bayer DCA Hb A1c Waived (Completed)     Endocrine   CKD stage 4 due to type 2 diabetes mellitus (HCC) - Primary    Chronic, ongoing, followed by nephrology.  Recent notes and labs reviewed.  Continue current medications as prescribed by them.  BMP today, urine ALB 150 June 2024.  Continue focus on vegan style diet.  A1c today 7.6%, downward  trend, praised for this.  Continue collaboration with nephrology and endocrinology, she will discuss GLP1 with them. - Cholesterol lowering medication and ARB on board - Eye and foot exam up to date - Vaccinations up to date      Relevant Orders   Bayer DCA Hb A1c Waived (Completed)   Microalbumin, Urine Waived (Completed)   Hyperlipidemia associated with type 2 diabetes mellitus (HCC)    Chronic, ongoing with poor tolerance to statins.  Followed by cardiology.  Continue Praluent and Zetia, as is intolerant to statins.  Lipid panel today.      Relevant Orders   Bayer DCA Hb A1c Waived (Completed)   Comprehensive metabolic panel   Lipid Panel w/o Chol/HDL Ratio   Hyperparathyroidism due to renal insufficiency (HCC)    Chronic, ongoing, followed by nephrology.  Continue this collaboration - recent note and labs reviewed.      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)    Chronic, ongoing, followed by nephrology.  Continue this collaboration - recent note and labs reviewed.  Urine ALB 150 June 2024.      Relevant Orders   Bayer DCA Hb A1c Waived (Completed)   Microalbumin, Urine Waived (Completed)   CBC with Differential/Platelet   Comprehensive metabolic panel     Musculoskeletal and Integument   Gouty arthritis    Chronic, ongoing.  Continues on renal dosed Allopurinol with no further flares.  Uric acid level today.      Relevant Orders   Uric acid     Other   Long term (current) use of insulin (HCC)    Ongoing, continue collaboration with endocrinology.      Relevant Orders   Bayer DCA Hb A1c Waived (Completed)   Obesity   Vitamin D deficiency    Chronic, ongoing.  Continue supplement daily and check level today.      Relevant Orders   VITAMIN D 25 Hydroxy (Vit-D Deficiency, Fractures)     Follow up plan: Return in about 6 months (around 11/28/2023) for T2DM, HTN/HLD, HF, GERD, CKD.

## 2023-05-29 NOTE — Assessment & Plan Note (Signed)
Ongoing, continue collaboration with endocrinology. 

## 2023-05-29 NOTE — Assessment & Plan Note (Signed)
Chronic, ongoing, followed by nephrology.  Continue this collaboration - recent note and labs reviewed. 

## 2023-05-29 NOTE — Assessment & Plan Note (Signed)
Chronic, ongoing, followed by nephrology.  Continue this collaboration - recent note and labs reviewed.  Urine ALB 150 June 2024.

## 2023-05-29 NOTE — Assessment & Plan Note (Signed)
Noted on past imaging.  Continue Plavix and Praluent for prevention. 

## 2023-05-29 NOTE — Assessment & Plan Note (Signed)
Chronic, ongoing.  Continues on renal dosed Allopurinol with no further flares.  Uric acid level today.

## 2023-05-29 NOTE — Assessment & Plan Note (Signed)
Chronic, stable, followed by cardiology.  Continue this collaboration and medication regimen as prescribed by them. 

## 2023-05-29 NOTE — Assessment & Plan Note (Signed)
Chronic, ongoing. Continue use of CPAP nightly.

## 2023-05-29 NOTE — Assessment & Plan Note (Signed)
Stable, no further discomfort.  Continue current diabetic regimen as ordered by endo and collaboration with both GI and endocrinology.  Continue to collaborate with CCM team as needed. Check Alpha Gal next visit as symptoms have improved with going to vegan diet with no red meat or pork.

## 2023-05-29 NOTE — Assessment & Plan Note (Signed)
Chronic, ongoing, followed by cardiology.  Euvolemic today.  Continue current medication regimen as prescribed by them.  Recommend: - Reminded to call for an overnight weight gain of >2 pounds or a weekly weight gain of >5 pounds - not adding salt to food and read food labels. Reviewed the importance of keeping daily sodium intake to <2000mg daily - Avoid all NSAID products 

## 2023-05-29 NOTE — Assessment & Plan Note (Signed)
Chronic, stable.  BP trending down today on recheck, recommend continue cut back on iced coffee.  Recommend she monitor BP at least a few mornings a week at home and document.  DASH diet at home.  Continue current medication regimen and adjust as needed -- continue collaboration with cardiology.  Labs today: CBC, CMP, TSH, urine ALB.  Urine ALB 150 June 2024.

## 2023-05-30 ENCOUNTER — Encounter: Payer: Self-pay | Admitting: Nurse Practitioner

## 2023-05-30 DIAGNOSIS — D631 Anemia in chronic kidney disease: Secondary | ICD-10-CM

## 2023-05-30 DIAGNOSIS — E1122 Type 2 diabetes mellitus with diabetic chronic kidney disease: Secondary | ICD-10-CM

## 2023-05-30 LAB — COMPREHENSIVE METABOLIC PANEL
ALT: 13 IU/L (ref 0–32)
AST: 11 IU/L (ref 0–40)
Albumin: 3.9 g/dL (ref 3.9–4.9)
Alkaline Phosphatase: 122 IU/L — ABNORMAL HIGH (ref 44–121)
BUN/Creatinine Ratio: 18 (ref 9–23)
BUN: 44 mg/dL — ABNORMAL HIGH (ref 6–24)
Bilirubin Total: <0.2 mg/dL (ref 0.0–1.2)
CO2: 20 mmol/L (ref 20–29)
Calcium: 9 mg/dL (ref 8.7–10.2)
Chloride: 102 mmol/L (ref 96–106)
Creatinine, Ser: 2.48 mg/dL — ABNORMAL HIGH (ref 0.57–1.00)
Globulin, Total: 3 g/dL (ref 1.5–4.5)
Glucose: 212 mg/dL — ABNORMAL HIGH (ref 70–99)
Potassium: 5 mmol/L (ref 3.5–5.2)
Sodium: 136 mmol/L (ref 134–144)
Total Protein: 6.9 g/dL (ref 6.0–8.5)
eGFR: 24 mL/min/{1.73_m2} — ABNORMAL LOW (ref >59)

## 2023-05-30 LAB — CBC WITH DIFFERENTIAL/PLATELET
Basophils Absolute: 0 10*3/uL (ref 0.0–0.2)
Basos: 1 %
EOS (ABSOLUTE): 0.2 10*3/uL (ref 0.0–0.4)
Eos: 3 %
Hematocrit: 32.9 % — ABNORMAL LOW (ref 34.0–46.6)
Hemoglobin: 9.9 g/dL — ABNORMAL LOW (ref 11.1–15.9)
Immature Grans (Abs): 0 10*3/uL (ref 0.0–0.1)
Immature Granulocytes: 0 %
Lymphocytes Absolute: 1.7 10*3/uL (ref 0.7–3.1)
Lymphs: 27 %
MCH: 25.1 pg — ABNORMAL LOW (ref 26.6–33.0)
MCHC: 30.1 g/dL — ABNORMAL LOW (ref 31.5–35.7)
MCV: 83 fL (ref 79–97)
Monocytes Absolute: 0.4 10*3/uL (ref 0.1–0.9)
Monocytes: 6 %
Neutrophils Absolute: 4.1 10*3/uL (ref 1.4–7.0)
Neutrophils: 63 %
Platelets: 350 10*3/uL (ref 150–450)
RBC: 3.95 x10E6/uL (ref 3.77–5.28)
RDW: 15.5 % — ABNORMAL HIGH (ref 11.7–15.4)
WBC: 6.4 10*3/uL (ref 3.4–10.8)

## 2023-05-30 LAB — LIPID PANEL W/O CHOL/HDL RATIO
Cholesterol, Total: 112 mg/dL (ref 100–199)
HDL: 40 mg/dL (ref >39)
LDL Chol Calc (NIH): 53 mg/dL (ref 0–99)
Triglycerides: 102 mg/dL (ref 0–149)
VLDL Cholesterol Cal: 19 mg/dL (ref 5–40)

## 2023-05-30 LAB — VITAMIN D 25 HYDROXY (VIT D DEFICIENCY, FRACTURES): Vit D, 25-Hydroxy: 14.8 ng/mL — ABNORMAL LOW (ref 30.0–100.0)

## 2023-05-30 LAB — TSH: TSH: 1.55 u[IU]/mL (ref 0.450–4.500)

## 2023-05-30 LAB — URIC ACID: Uric Acid: 9.7 mg/dL — ABNORMAL HIGH (ref 2.6–6.2)

## 2023-05-30 NOTE — Progress Notes (Signed)
Contacted via MyChart   Good morning Vaniya, your labs have returned: - CBC is showing a mild drop in your hemoglobin and hematocrit -- have you been more tired or cold -- eating more ice?  Let me know.  Have you seen hematology before for anemia. - Your creatinine and eGFR have shown some decline from last labs with nephrology.  I do not see you are scheduled to return to see them, please call them and schedule as your last visit was November and it is important to maintain relationship with them. - Uric acid a little elevated, continue Allopurinol. - Cholesterol labs at goal and thyroid level normal. - Vitamin D remains low, please ensure you take Vitamin D supplement.  Any questions? Keep being awesome!!  Thank you for allowing me to participate in your care.  I appreciate you. Kindest regards, Asir Bingley

## 2023-06-01 ENCOUNTER — Other Ambulatory Visit: Payer: Self-pay | Admitting: Nurse Practitioner

## 2023-06-01 ENCOUNTER — Other Ambulatory Visit: Payer: Self-pay | Admitting: Cardiovascular Disease

## 2023-06-03 NOTE — Telephone Encounter (Signed)
Requested medication (s) are due for refill today -expired Rx  Requested medication (s) are on the active medication list -yes  Future visit scheduled -yes  Last refill: 05/22/22 #45 4RF  Notes to clinic: expired Rx  Requested Prescriptions  Pending Prescriptions Disp Refills   allopurinol (ZYLOPRIM) 100 MG tablet [Pharmacy Med Name: Allopurinol 100 MG Oral Tablet] 45 tablet 0    Sig: Take 1/2 (one-half) tablet by mouth once daily     Endocrinology:  Gout Agents - allopurinol Failed - 06/01/2023  3:00 PM      Failed - Uric Acid in normal range and within 360 days    Uric Acid  Date Value Ref Range Status  05/29/2023 9.7 (H) 2.6 - 6.2 mg/dL Final    Comment:               Therapeutic target for gout patients: <6.0         Failed - Cr in normal range and within 360 days    Creatinine  Date Value Ref Range Status  03/31/2015 0.87 mg/dL Final    Comment:    4.09-8.11 NOTE: New Reference Range  02/14/15    Creatinine, Ser  Date Value Ref Range Status  05/29/2023 2.48 (H) 0.57 - 1.00 mg/dL Final         Failed - CBC within normal limits and completed in the last 12 months    WBC  Date Value Ref Range Status  05/29/2023 6.4 3.4 - 10.8 x10E3/uL Final  10/11/2020 7.6 4.0 - 10.5 K/uL Final   RBC  Date Value Ref Range Status  05/29/2023 3.95 3.77 - 5.28 x10E6/uL Final  10/11/2020 3.91 3.87 - 5.11 MIL/uL Final   Hemoglobin  Date Value Ref Range Status  05/29/2023 9.9 (L) 11.1 - 15.9 g/dL Final   Hematocrit  Date Value Ref Range Status  05/29/2023 32.9 (L) 34.0 - 46.6 % Final   MCHC  Date Value Ref Range Status  05/29/2023 30.1 (L) 31.5 - 35.7 g/dL Final  91/47/8295 62.1 30.0 - 36.0 g/dL Final   [facility]  Date Value Ref Range Status  05/29/2023 25.1 (L) 26.6 - 33.0 pg Final  10/11/2020 26.9 26.0 - 34.0 pg Final   MCV  Date Value Ref Range Status  05/29/2023 83 79 - 97 fL Final  03/29/2015 79 (L) 80 - 100 fL Final   No results found for: "PLTCOUNTKUC", "LABPLAT",  "POCPLA" RDW  Date Value Ref Range Status  05/29/2023 15.5 (H) 11.7 - 15.4 % Final  03/29/2015 14.2 11.5 - 14.5 % Final         Passed - Valid encounter within last 12 months    Recent Outpatient Visits           5 days ago CKD stage 4 due to type 2 diabetes mellitus (HCC)   [place] [facility] [place], Corrie Dandy T, NP   3 months ago Urinary symptom or sign   [place] [facility] [place], Corrie Dandy T, NP   6 months ago Medicare annual wellness visit, subsequent   [place] [facility] [place], Corrie Dandy T, NP   1 year ago Gouty arthritis   [place] [facility] [place], [place] T, NP   1 year ago 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)    [place] [facility] Marjie Skiff, NP       Future Appointments             In  1 month Creig Hines, NP [facility] [place]   In 5 months Marjie Skiff, NP [place] [facility], [facility]               Requested Prescriptions  Pending Prescriptions Disp Refills   allopurinol (ZYLOPRIM) 100 MG tablet [Pharmacy Med Name: Allopurinol 100 MG Oral Tablet] 45 tablet 0    Sig: Take 1/2 (one-half) tablet by mouth once daily     Endocrinology:  Gout Agents - allopurinol Failed - 06/01/2023  3:00 PM      Failed - Uric Acid in normal range and within 360 days    Uric Acid  Date Value Ref Range Status  05/29/2023 9.7 (H) 2.6 - 6.2 mg/dL Final    Comment:               Therapeutic target for gout patients: <6.0         Failed - Cr in normal range and within 360 days    Creatinine  Date Value Ref Range Status  03/31/2015 0.87 mg/dL Final    Comment:    3.29-5.18 NOTE: New Reference Range  02/14/15    Creatinine, Ser  Date Value Ref Range Status  05/29/2023 2.48 (H) 0.57 - 1.00 mg/dL Final         Failed - CBC within normal limits and completed in the last 12 months    WBC  Date Value  Ref Range Status  05/29/2023 6.4 3.4 - 10.8 x10E3/uL Final  10/11/2020 7.6 4.0 - 10.5 K/uL Final   RBC  Date Value Ref Range Status  05/29/2023 3.95 3.77 - 5.28 x10E6/uL Final  10/11/2020 3.91 3.87 - 5.11 MIL/uL Final   Hemoglobin  Date Value Ref Range Status  05/29/2023 9.9 (L) 11.1 - 15.9 g/dL Final   Hematocrit  Date Value Ref Range Status  05/29/2023 32.9 (L) 34.0 - 46.6 % Final   MCHC  Date Value Ref Range Status  05/29/2023 30.1 (L) 31.5 - 35.7 g/dL Final  84/16/6063 01.6 30.0 - 36.0 g/dL Final   [facility]  Date Value Ref Range Status  05/29/2023 25.1 (L) 26.6 - 33.0 pg Final  10/11/2020 26.9 26.0 - 34.0 pg Final   MCV  Date Value Ref Range Status  05/29/2023 83 79 - 97 fL Final  03/29/2015 79 (L) 80 - 100 fL Final   No results found for: "PLTCOUNTKUC", "LABPLAT", "POCPLA" RDW  Date Value Ref Range Status  05/29/2023 15.5 (H) 11.7 - 15.4 % Final  03/29/2015 14.2 11.5 - 14.5 % Final         Passed - Valid encounter within last 12 months    Recent Outpatient Visits           5 days ago CKD stage 4 due to type 2 diabetes mellitus (HCC)   [place] [facility] [place], Corrie Dandy T, NP   3 months ago Urinary symptom or sign   [place] [facility] [place], Corrie Dandy T, NP   6 months ago Medicare annual wellness visit, subsequent   [place] [facility] [place], Corrie Dandy T, NP   1 year ago Gouty arthritis   [place] [facility] [place], [place] T, NP   1 year ago 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

## 2023-06-05 ENCOUNTER — Inpatient Hospital Stay: Payer: Medicare (Managed Care) | Attending: Internal Medicine | Admitting: Internal Medicine

## 2023-06-05 ENCOUNTER — Inpatient Hospital Stay: Payer: Medicare (Managed Care)

## 2023-06-05 ENCOUNTER — Encounter: Payer: Self-pay | Admitting: Internal Medicine

## 2023-06-05 VITALS — BP 147/71 | HR 76 | Temp 97.6°F | Resp 20 | Wt 287.7 lb

## 2023-06-05 DIAGNOSIS — E785 Hyperlipidemia, unspecified: Secondary | ICD-10-CM | POA: Diagnosis not present

## 2023-06-05 DIAGNOSIS — N184 Chronic kidney disease, stage 4 (severe): Secondary | ICD-10-CM

## 2023-06-05 DIAGNOSIS — D649 Anemia, unspecified: Secondary | ICD-10-CM

## 2023-06-05 DIAGNOSIS — I130 Hypertensive heart and chronic kidney disease with heart failure and stage 1 through stage 4 chronic kidney disease, or unspecified chronic kidney disease: Secondary | ICD-10-CM | POA: Diagnosis not present

## 2023-06-05 DIAGNOSIS — I2510 Atherosclerotic heart disease of native coronary artery without angina pectoris: Secondary | ICD-10-CM | POA: Diagnosis not present

## 2023-06-05 DIAGNOSIS — E1122 Type 2 diabetes mellitus with diabetic chronic kidney disease: Secondary | ICD-10-CM | POA: Insufficient documentation

## 2023-06-05 DIAGNOSIS — K3184 Gastroparesis: Secondary | ICD-10-CM | POA: Diagnosis not present

## 2023-06-05 DIAGNOSIS — K219 Gastro-esophageal reflux disease without esophagitis: Secondary | ICD-10-CM

## 2023-06-05 LAB — FOLATE: Folate: 16.5 ng/mL (ref >5.9)

## 2023-06-05 LAB — IRON AND TIBC
Iron: 36 ug/dL (ref 28–170)
Saturation Ratios: 11 % (ref 10.4–31.8)
TIBC: 332 ug/dL (ref 250–450)
UIBC: 296 ug/dL

## 2023-06-05 LAB — FERRITIN: Ferritin: 42 ng/mL (ref 11–307)

## 2023-06-05 NOTE — Progress Notes (Signed)
[facility]  [phone]) [phone] Fax:([phone]) [phone]  ID: [name] [name] OB: 07-25-1981  MR#: [id number]  AOZ#:[id number]  Patient Care Team: Marjie Skiff, NP as PCP - General (Nurse Practitioner) Iran Ouch, MD as PCP - Cardiology (Cardiology) Iran Ouch, MD as Consulting Physician (Cardiology) Delma Freeze, FNP as Nurse Practitioner (Family Medicine)  REFERRING PROVIDER: Aura Dials, NP  REASON FOR REFERRAL: Normocytic anemia  HPI: [name] [name] is a 42 y.o. female with past medical history of CKD stage IV likely secondary to diabetes, CAD status post PCI, CHF, diabetes, diabetic gastroparesis, diabetic retinopathy, GERD, migraines, iron deficiency anemia received iron infusion in 2019, hypertension, hyperlipidemia was referred to hematology for management of normocytic anemia.  Patient has chronic history of anemia and has been slowly progressing.  Reports history of iron infusions in March 2020.  Also reports history of blood transfusion about the same time.  She follows with Dr. Thedore Mins for CKD and was thought secondary to poorly controlled diabetes.  Previously tested for ANCA, ANA, complements in 2020 which was negative.  She has low appetite for the past couple of weeks.  Otherwise has been feeling okay.  REVIEW OF SYSTEMS:   ROS  As per HPI. Otherwise, a complete review of systems is negative.  PAST MEDICAL HISTORY: Past Medical History:  Diagnosis Date   CAD (coronary artery disease)    a. 03/2015 NSTEMI/PCI: LCX 118m (DES), OM1 100p (DES - vessel labeled Ramus in subsequent cath report); b. 04/2015 Cath: patent stents/stable anatomy; c. 02/2016 MV: No isch; d. 11/2018 PCI: LCX 95p/m (PTCA->20%); e. 01/2019 PCI: RCA 85p/m (2.5x32 Synergy DES); f. 01/2021 Cath: LM 30, LAD 40ost/p, 39m/d, LCX 55p restenosis, OM1 10, OM2 40, RCA patent stent-->Med rx.   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 03/2015 Echo: EF 30-35%, mild concentric LVH, severe HK of inf, inflat, & lat walls, mod MR, mild TR;  b. 04/2015 LV Gram: EF 55-65%; c. 09/2017 Echo: EF 55-60%, no rwma. Mild MR; d. 11/2018 Echo: Ef 60-65%, no rwma; e. 06/2022 Echo: EF 60-65%, nl RV fxn, mildly dil LA, mild MR.   CKD (chronic kidney disease), stage IV (HCC)    Diabetes type 2, controlled (HCC)    a. since 2002    Diabetic gastroparesis (HCC)    a. chronic nausea/vomiting.   Diabetic retinopathy (HCC)    Diverticulosis, sigmoid 07/2016   Fatty liver    GERD (gastroesophageal reflux disease)    Heavy menses    a. H/O IUD - expired in 2014 - remains in place.   Hx of migraines    Hyperkalemia    a. On chronic lokelma - managed by nephrology.   Hyperlipidemia    Intolerant of atorvastatin, rosuvastatin   Hypertension    Iron deficiency anemia    Ischemic cardiomyopathy (resolved)    a. 03/2015 EF 30-35% post NSTEMI;  b. 04/2015 EF 55-65% on LV gram; c. 09/2017 Echo: EF 55-60%; d. 11/2018 Echo: Ef 60-65%; e. 06/2022 Echo: EF 60-65%.   Obesity    Tobacco abuse    a. quit 03/2015.   Uterine fibroid    Vertigo    Vitamin D deficiency     PAST SURGICAL HISTORY: Past Surgical History:  Procedure Laterality Date   APPENDECTOMY     CARDIAC CATHETERIZATION  03/2015   x2 stent [facility]   CARDIAC CATHETERIZATION  N/A 05/05/2015   Procedure: Left Heart Cath and Coronary Angiography;  Surgeon: Peter M Swaziland, MD;  Location: [facility] INVASIVE CV LAB;  Service: Cardiovascular;  Laterality: N/A;   CATARACT EXTRACTION Left 09/12/2021   CATARACT EXTRACTION Right 10/10/2021   COLONOSCOPY WITH PROPOFOL N/A 07/31/2016   Procedure: COLONOSCOPY WITH PROPOFOL;  Surgeon: Christena Deem, MD;  Location: [facility] ENDOSCOPY;  Service: Endoscopy;  Laterality: N/A;   CORONARY BALLOON ANGIOPLASTY N/A 12/04/2018   Procedure: CORONARY BALLOON ANGIOPLASTY;  Surgeon: Iran Ouch, MD;  Location:  [facility] INVASIVE CV LAB;  Service: Cardiovascular;  Laterality: N/A;   CORONARY PRESSURE/FFR STUDY N/A 12/04/2018   Procedure: INTRAVASCULAR PRESSURE WIRE/FFR STUDY;  Surgeon: Iran Ouch, MD;  Location: [facility] INVASIVE CV LAB;  Service: Cardiovascular;  Laterality: N/A;   CORONARY STENT INTERVENTION N/A 01/12/2019   Procedure: CORONARY STENT INTERVENTION;  Surgeon: Yvonne Kendall, MD;  Location: [facility] INVASIVE CV LAB;  Service: Cardiovascular;  Laterality: N/A;   ESOPHAGOGASTRODUODENOSCOPY (EGD) WITH PROPOFOL N/A 07/31/2016   Procedure: ESOPHAGOGASTRODUODENOSCOPY (EGD) WITH PROPOFOL;  Surgeon: Christena Deem, MD;  Location: [facility] ENDOSCOPY;  Service: Endoscopy;  Laterality: N/A;   EYE SURGERY  07/16/2022   Membrane peeled   LAPAROSCOPIC APPENDECTOMY N/A 08/07/2016   Procedure: APPENDECTOMY LAPAROSCOPIC;  Surgeon: Gladis Riffle, MD;  Location: [facility] ORS;  Service: General;  Laterality: N/A;   LEFT HEART CATH AND CORONARY ANGIOGRAPHY Left 12/04/2018   Procedure: LEFT HEART CATH AND CORONARY ANGIOGRAPHY;  Surgeon: Iran Ouch, MD;  Location: [facility] INVASIVE CV LAB;  Service: Cardiovascular;  Laterality: Left;   LEFT HEART CATH AND CORONARY ANGIOGRAPHY N/A 01/12/2019   Procedure: LEFT HEART CATH AND CORONARY ANGIOGRAPHY;  Surgeon: Yvonne Kendall, MD;  Location: [facility] INVASIVE CV LAB;  Service: Cardiovascular;  Laterality: N/A;   MOUTH SURGERY     RIGHT/LEFT HEART CATH AND CORONARY ANGIOGRAPHY N/A 01/29/2021   Procedure: RIGHT/LEFT HEART CATH AND CORONARY ANGIOGRAPHY;  Surgeon: Iran Ouch, MD;  Location: [facility] INVASIVE CV LAB;  Service: Cardiovascular;  Laterality: N/A;   VITRECTOMY Left 10/30/2021    FAMILY HISTORY: Family History  Problem Relation Age of Onset   Diabetes Mother    Heart disease Father    Diabetes Father    Cancer - Cervical Maternal Aunt    Cancer Maternal Grandmother        lung   Cancer Maternal Grandfather        prostate   Diabetes Paternal  Grandmother    Diabetes Paternal Grandfather     HEALTH MAINTENANCE: Social History   Tobacco Use   Smoking status: Former    Years: 15    Types: Cigarettes    Quit date: 03/10/2015    Years since quitting: 8.2   Smokeless tobacco: Never  Vaping Use   Vaping Use: Never used  Substance Use Topics   Alcohol use: No    Alcohol/week: 0.0 standard drinks of alcohol   Drug use: Not Currently     Allergies  Allergen Reactions   Lisinopril Anaphylaxis, Itching and Swelling   Other Itching and Swelling    [place] seasoning    Rosemary Oil Anaphylaxis   Shellfish Allergy Itching and Swelling    Pt states that she HAS had IV Contrast without any reaction   Statins Other (See Comments)    Myalgias   Metformin And Related Diarrhea, Nausea And Vomiting and Rash    Current Outpatient Medications  Medication Sig Dispense Refill   acetaminophen (TYLENOL) 500 MG tablet Take  1,000 mg by mouth every 4 (four) hours as needed.     Alirocumab (PRALUENT) 150 MG/ML SOAJ INJECT 1 ML INTO THE SKIN EVERY 14 DAYS 2 mL 11   allopurinol (ZYLOPRIM) 100 MG tablet Take 1/2 (one-half) tablet by mouth once daily 45 tablet 4   aspirin EC 81 MG tablet Take 81 mg by mouth daily.     BD PEN NEEDLE MICRO U/F 32G X 6 MM MISC Inject into the skin.     carvedilol (COREG) 12.5 MG tablet Take 1 tablet (12.5 mg total) by mouth 2 (two) times daily with a meal. 60 tablet 1   clopidogrel (PLAVIX) 75 MG tablet Take 1 tablet (75 mg total) by mouth daily. PLEASE CALL [phone] TO SCHEDULE AN APPOINTMENT FOR FURTHER REFILLS. THANK YOU. 1ST ATTEMPT 30 tablet 0   Continuous Blood Gluc Receiver (DEXCOM G6 RECEIVER) DEVI      Continuous Blood Gluc Sensor (DEXCOM G6 SENSOR) MISC See admin instructions.     Continuous Blood Gluc Transmit (DEXCOM G6 TRANSMITTER) MISC      EPINEPHrine 0.3 mg/0.3 mL IJ SOAJ injection See admin instructions.     EQ ALLERGY RELIEF, CETIRIZINE, 10 MG tablet Take 10 mg by mouth daily as needed.      etonogestrel (NEXPLANON) 68 MG IMPL implant 1 each (68 mg total) by Subdermal route once for 1 dose. 1 each 0   ezetimibe (ZETIA) 10 MG tablet Take 1 tablet by mouth once daily 90 tablet 3   famotidine (PEPCID) 40 MG tablet Take 1 tablet (40 mg total) by mouth daily. 90 tablet 4   furosemide (LASIX) 20 MG tablet Take 20 mg by mouth daily.     insulin aspart (NOVOLOG) 100 UNIT/ML injection Inject 10 Units into the skin 3 (three) times daily with meals. 10 mL 11   Insulin Glargine (BASAGLAR KWIKPEN) 100 UNIT/ML Inject 100 Units into the skin daily.     irbesartan (AVAPRO) 150 MG tablet Take 75 mg by mouth daily.     isosorbide mononitrate (IMDUR) 120 MG 24 hr tablet Take 1 tablet (120 mg total) by mouth daily. PLEASE CALL [phone] TO SCHEDULE AN APPOINTMENT AND FOR FURTHER REFILLS. THANK YOU. 1ST ATTEMPT. 30 tablet 0   LOKELMA 10 g PACK packet Take 10 g by mouth daily.     nitroGLYCERIN (NITROSTAT) 0.4 MG SL tablet Take 1 tab under your tongue while sitting for chest pain, If no relief may repeat, one tab every 5 min up to 3 tabs total over 15 mins. 25 tablet 3   pantoprazole (PROTONIX) 40 MG tablet Take 1 tablet by mouth twice daily 180 tablet 4   XHANCE 93 MCG/ACT EXHU Place 1 spray into both nostrils 2 (two) times daily.     ACCU-CHEK AVIVA PLUS test strip USE 1 STRIP TO CHECK GLUCOSE THREE TIMES DAILY.  DX E11.29 (Patient not taking: Reported on 05/28/2023) 100 each 12   No current facility-administered medications for this visit.   Facility-Administered Medications Ordered in Other Visits  Medication Dose Route Frequency Provider Last Rate Last Admin   sodium chloride flush (NS) 0.9 % injection 3 mL  3 mL Intravenous Q12H Visser, Jacquelyn D, PA-C        OBJECTIVE: Vitals:   06/05/23 1106  BP: (!) 147/71  Pulse: 76  Resp: 20  Temp: 97.6 F (36.4 C)  SpO2: 100%     Body mass index is 47.88 kg/m.      General: Well-developed, well-nourished, no acute distress. Eyes:  Pink  conjunctiva, anicteric sclera. HEENT: Normocephalic, moist mucous membranes, clear oropharnyx. Lungs: Clear to auscultation bilaterally. Heart: Regular rate and rhythm. No rubs, murmurs, or gallops. Abdomen: Soft, nontender, nondistended. No organomegaly noted, normoactive bowel sounds. Musculoskeletal: No edema, cyanosis, or clubbing. Neuro: Alert, answering all questions appropriately. Cranial nerves grossly intact. Skin: No rashes or petechiae noted. Psych: Normal affect. Lymphatics: No cervical, calvicular, axillary or inguinal LAD.   LAB RESULTS:  Lab Results  Component Value Date   NA 136 05/29/2023   K 5.0 05/29/2023   CL 102 05/29/2023   CO2 20 05/29/2023   GLUCOSE 212 (H) 05/29/2023   BUN 44 (H) 05/29/2023   CREATININE 2.48 (H) 05/29/2023   CALCIUM 9.0 05/29/2023   PROT 6.9 05/29/2023   ALBUMIN 3.9 05/29/2023   AST 11 05/29/2023   ALT 13 05/29/2023   ALKPHOS 122 (H) 05/29/2023   BILITOT <0.2 05/29/2023   GFRNONAA 34 (L) 08/06/2022   GFRAA 46 (L) 01/10/2021    Lab Results  Component Value Date   WBC 6.4 05/29/2023   NEUTROABS 4.1 05/29/2023   HGB 9.9 (L) 05/29/2023   HCT 32.9 (L) 05/29/2023   MCV 83 05/29/2023   PLT 350 05/29/2023    Lab Results  Component Value Date   TIBC 274 08/06/2021   TIBC 324 02/15/2019   TIBC 319 01/14/2019   FERRITIN 131 08/06/2021   FERRITIN 23 02/15/2019   FERRITIN 32 01/14/2019   IRONPCTSAT 24 08/06/2021   IRONPCTSAT 11 02/15/2019   IRONPCTSAT 9 (L) 01/14/2019     STUDIES: No results found.  ASSESSMENT AND PLAN:   [name] [name] is a 42 y.o. female with past medical history of CKD stage IV likely secondary to diabetes, CAD status post PCI, CHF, diabetes, diabetic gastroparesis, diabetic retinopathy, GERD, migraines, iron deficiency anemia received iron infusion in 2019, hypertension, hyperlipidemia was referred to hematology for management of normocytic anemia.  # Normocytic anemia # CKD stage IV likely secondary to  poorly controlled diabetes - Recent labs reviewed from 05/29/2023.  Creatinine 2.48.  GFR 24.  Hemoglobin 9.9, WBC 6.4 and platelets 350. Iron panel from November 2023 iron 43, saturation 13%. -I discussed with the patient about potential etiologies of anemia such as nutritional deficiencies with iron level, B12, folate.  Will also check for EPO level, SPEP, Kappa lambda light chain.  There is definitely a component of chronic kidney disease.  She is maintaining her hemoglobin between 9-10.  I did discuss about EPO injections but will hold off until hemoglobin goes below 9.  Side effects such as hypertension, increased risk of cardiovascular events and blood clots if hemoglobin goes above 11 was discussed.  She has history of iron deficiency in the past and has received Feraheme x 2 doses in 2020.  Labs as below.  If her levels come back low I will reach out to her to discuss about supplementation.  Otherwise follow-up in 3 months to monitor hemoglobin.  Orders Placed This Encounter  Procedures   Iron and TIBC   Ferritin   Vitamin B12   Folate   Erythropoietin   Multiple Myeloma Panel (SPEP&IFE w/QIG)   Kappa/lambda light chains   CBC with Differential (Cancer Center Only)   Iron and TIBC([facility])   RTC in 3 months for MD visit, labs  Patient expressed understanding and was in agreement with this plan. She also understands that She can call clinic at any time with any questions, concerns, or complaints.   I spent a total  of 45 minutes reviewing chart data, face-to-face evaluation with the patient, counseling and coordination of care as detailed above.  Michaelyn Barter, MD   06/05/2023 12:05 PM

## 2023-06-05 NOTE — Progress Notes (Signed)
Patient has no concerns today. 

## 2023-06-05 NOTE — Telephone Encounter (Signed)
Patient is scheduled for 8/7

## 2023-06-06 LAB — VITAMIN B12: Vitamin B-12: 294 pg/mL (ref 180–914)

## 2023-06-06 LAB — KAPPA/LAMBDA LIGHT CHAINS
Kappa free light chain: 64.9 mg/L — ABNORMAL HIGH (ref 3.3–19.4)
Kappa, lambda light chain ratio: 1.66 — ABNORMAL HIGH (ref 0.26–1.65)
Lambda free light chains: 39.2 mg/L — ABNORMAL HIGH (ref 5.7–26.3)

## 2023-06-06 LAB — ERYTHROPOIETIN: Erythropoietin: 14.7 m[IU]/mL (ref 2.6–18.5)

## 2023-06-10 LAB — MULTIPLE MYELOMA PANEL, SERUM
Albumin SerPl Elph-Mcnc: 3.2 g/dL (ref 2.9–4.4)
Albumin/Glob SerPl: 0.9 (ref 0.7–1.7)
Alpha 1: 0.2 g/dL (ref 0.0–0.4)
Alpha2 Glob SerPl Elph-Mcnc: 1.1 g/dL — ABNORMAL HIGH (ref 0.4–1.0)
B-Globulin SerPl Elph-Mcnc: 1 g/dL (ref 0.7–1.3)
Gamma Glob SerPl Elph-Mcnc: 1.3 g/dL (ref 0.4–1.8)
Globulin, Total: 3.7 g/dL (ref 2.2–3.9)
IgA: 100 mg/dL (ref 87–352)
IgG (Immunoglobin G), Serum: 1422 mg/dL (ref 586–1602)
IgM (Immunoglobulin M), Srm: 142 mg/dL (ref 26–217)
Total Protein ELP: 6.9 g/dL (ref 6.0–8.5)

## 2023-06-18 ENCOUNTER — Encounter: Payer: Self-pay | Admitting: Internal Medicine

## 2023-06-18 MED FILL — Iron Sucrose Inj 20 MG/ML (Fe Equiv): INTRAVENOUS | Qty: 10 | Status: AC

## 2023-06-18 NOTE — Addendum Note (Signed)
Addended byMichaelyn Barter on: 06/18/2023 10:49 AM   Modules accepted: Orders

## 2023-06-18 NOTE — Addendum Note (Signed)
Addended byMichaelyn Barter on: 06/18/2023 10:25 AM   Modules accepted: Orders

## 2023-06-19 ENCOUNTER — Inpatient Hospital Stay: Payer: Medicare (Managed Care) | Attending: Internal Medicine

## 2023-06-19 VITALS — BP 151/79 | HR 65 | Temp 97.8°F

## 2023-06-19 DIAGNOSIS — N184 Chronic kidney disease, stage 4 (severe): Secondary | ICD-10-CM | POA: Insufficient documentation

## 2023-06-19 DIAGNOSIS — D631 Anemia in chronic kidney disease: Secondary | ICD-10-CM | POA: Diagnosis present

## 2023-06-19 DIAGNOSIS — D649 Anemia, unspecified: Secondary | ICD-10-CM

## 2023-06-19 MED ORDER — SODIUM CHLORIDE 0.9 % IV SOLN
Freq: Once | INTRAVENOUS | Status: AC
  Filled 2023-06-19: qty 250

## 2023-06-19 MED ORDER — SODIUM CHLORIDE 0.9 % IV SOLN
200.0000 mg | Freq: Once | INTRAVENOUS | Status: AC
  Administered 2023-06-19: 200 mg via INTRAVENOUS
  Filled 2023-06-19: qty 200

## 2023-06-19 NOTE — Patient Instructions (Signed)

## 2023-06-25 ENCOUNTER — Encounter: Payer: Self-pay | Admitting: Internal Medicine

## 2023-06-25 MED FILL — Iron Sucrose Inj 20 MG/ML (Fe Equiv): INTRAVENOUS | Qty: 10 | Status: AC

## 2023-06-26 ENCOUNTER — Inpatient Hospital Stay: Payer: Medicare (Managed Care)

## 2023-06-26 VITALS — BP 143/65 | HR 72 | Temp 97.2°F | Resp 18

## 2023-06-26 DIAGNOSIS — N184 Chronic kidney disease, stage 4 (severe): Secondary | ICD-10-CM | POA: Diagnosis not present

## 2023-06-26 DIAGNOSIS — D649 Anemia, unspecified: Secondary | ICD-10-CM

## 2023-06-26 MED ORDER — SODIUM CHLORIDE 0.9 % IV SOLN
200.0000 mg | Freq: Once | INTRAVENOUS | Status: AC
  Administered 2023-06-26: 200 mg via INTRAVENOUS
  Filled 2023-06-26: qty 10

## 2023-06-26 MED ORDER — SODIUM CHLORIDE 0.9 % IV SOLN
Freq: Once | INTRAVENOUS | Status: AC
  Filled 2023-06-26: qty 250

## 2023-07-01 ENCOUNTER — Other Ambulatory Visit: Payer: Self-pay | Admitting: Nurse Practitioner

## 2023-07-02 ENCOUNTER — Other Ambulatory Visit: Payer: Self-pay

## 2023-07-02 ENCOUNTER — Encounter: Payer: Self-pay | Admitting: Internal Medicine

## 2023-07-02 ENCOUNTER — Ambulatory Visit (INDEPENDENT_AMBULATORY_CARE_PROVIDER_SITE_OTHER): Payer: Medicare (Managed Care) | Admitting: Internal Medicine

## 2023-07-02 VITALS — BP 130/84 | HR 93 | Temp 98.0°F | Resp 18 | Ht 65.0 in | Wt 285.5 lb

## 2023-07-02 DIAGNOSIS — L272 Dermatitis due to ingested food: Secondary | ICD-10-CM

## 2023-07-02 DIAGNOSIS — J3081 Allergic rhinitis due to animal (cat) (dog) hair and dander: Secondary | ICD-10-CM

## 2023-07-02 DIAGNOSIS — K219 Gastro-esophageal reflux disease without esophagitis: Secondary | ICD-10-CM

## 2023-07-02 DIAGNOSIS — T781XXA Other adverse food reactions, not elsewhere classified, initial encounter: Secondary | ICD-10-CM

## 2023-07-02 DIAGNOSIS — J3089 Other allergic rhinitis: Secondary | ICD-10-CM

## 2023-07-02 DIAGNOSIS — J301 Allergic rhinitis due to pollen: Secondary | ICD-10-CM | POA: Diagnosis not present

## 2023-07-02 DIAGNOSIS — Z79899 Other long term (current) drug therapy: Secondary | ICD-10-CM

## 2023-07-02 DIAGNOSIS — T7800XA Anaphylactic reaction due to unspecified food, initial encounter: Secondary | ICD-10-CM

## 2023-07-02 DIAGNOSIS — Z8709 Personal history of other diseases of the respiratory system: Secondary | ICD-10-CM

## 2023-07-02 MED ORDER — AZELASTINE HCL 0.1 % NA SOLN: 1.0000 | Freq: Two times a day (BID) | NASAL | 5 refills | Status: AC | PRN

## 2023-07-02 MED ORDER — OLOPATADINE HCL 0.2 % OP SOLN: 1.0000 [drp] | Freq: Every day | OPHTHALMIC | 5 refills | Status: AC | PRN

## 2023-07-02 MED ORDER — EPINEPHRINE 0.3 MG/0.3ML IJ SOAJ: 0.3000 mg | INTRAMUSCULAR | 1 refills | Status: AC | PRN

## 2023-07-02 MED ORDER — CETIRIZINE HCL 10 MG PO TABS: 10.0000 mg | ORAL_TABLET | Freq: Every day | ORAL | 5 refills | Status: AC

## 2023-07-02 NOTE — Patient Instructions (Addendum)
Allergic Rhinitis: Nasal Polyps  - Positive skin test 06/2023: trees, grasses, weeds, molds, cats, dogs, dust mite - Avoidance measures discussed. - Use nasal saline rinses before nose sprays such as with Neilmed Sinus Rinse.  Use distilled water.   - Use Xhance 1 spray each nostril twice daily. - Use Azelastine 1-2 sprays each nostril twice daily as needed for runny nose, drainage, sneezing, congestion. Aim upward and outward. - Use Zyrtec 10 mg daily.  - For eyes, use Olopatadine or Ketotifen 1 eye drop daily as needed for itchy, watery eyes.  Available over the counter, if not covered by insurance.  - Based on your significant cardiac and renal history with use of beta blocker, I do not think you are a good candidate for allergy shots.  There is a risk of life threatening reaction, if you were to react to the allergy shot.   Food allergy:  - SPT 06/2023 positive for shellfish. - please strictly avoid shellfish. - for SKIN only reaction, okay to take Benadryl 25mg  capsules every 6 hours as needed - for SKIN + ANY additional symptoms, OR IF concern for LIFE THREATENING reaction = Epipen Autoinjector EpiPen 0.3 mg. - If using Epinephrine autoinjector, call 911 or go to the ER.    Oral Allergy Syndrome- Fruits  - These symptoms are typically not life-threatening and are because of a cross reaction between a pollen you are allergic to, and to a protein in specific foods (such as fresh fruits, vegetables, and nuts). - If you can eat these things and tolerate the symptoms, it is fine to continue to do so.  If not, you may avoid these fresh fruits and vegetables.   - Heating these foods, buying them canned, and peeling these foods should allow them to be consumed without symptoms or with less symptoms.  GERD - Continue Protonix 20mg  daily in morning on empty stomach.  Eat a small meal or snack about 30-45 minutes after.  - Continue Pepcid 20mg  daily as needed.  -Avoid lying down for at least two  hours after a meal or after drinking acidic beverages, like soda, or other caffeinated beverages. This can help to prevent stomach contents from flowing back into the esophagus. -Keep your head elevated while you sleep. Using an extra pillow or two can also help to prevent reflux. -Eat smaller and more frequent meals each day instead of a few large meals. This promotes digestion and can aid in preventing heartburn. -Wear loose-fitting clothes to ease pressure on the stomach, which can worsen heartburn and reflux. -Reduce excess weight around the midsection. This can ease pressure on the stomach. Such pressure can force some stomach contents back up the esophagus.

## 2023-07-02 NOTE — Progress Notes (Addendum)
NEW PATIENT  Date of Service/Encounter:  07/02/23  Consult requested by: Marjie Skiff, NP   Subjective:   [name] [name] (DOB: March 13, 1981) is a 42 y.o. female who presents to the clinic on 07/02/2023 with a chief complaint of Immunotherapy (Patient wants to continue allergy injections here) .    History obtained from: chart review and patient.   Rhinitis:  Started in childhood but symptoms worsened the past few years.  Symptoms include:  headaches/migraines, nasal congestion, rhinorrhea, post nasal drainage, sneezing, watery eyes, and itchy eyes CT sinus: R nasal blocked; oct 2023   Occurs year-round Potential triggers: pet dander  Treatments tried:  Xhance  Previously on AIT for about 3 months and stopped for 1 year; never had a reaction to shots  Zyrtec 10mg  daily; last use was 3 days ago  Previous allergy testing: yes; with [facility] but unsure of exact results   History of sinus surgery: no Nonallergic triggers: none    GERD: Currently controlled with protonix and Pepcid daily.  When she stops it, she gets frequent heartburn and reflux.   Concern for Food Allergy:  Shellfish- rashes, swelling and itching with certain amount of shrimp/crab; also gets really itchy hands when she peels them.  Now only eats it small amounts and infrequently.  Banana, grapes, pineapple- mouth itching, no other sxs Rosemary/old bay seasoning- mouth itching, no other sxs  She has CHF with recovered EF, CAD s/p PCI in 2020, CKD, fatty liver disease, HTN, anemia   Past Medical History: Past Medical History:  Diagnosis Date   CAD (coronary artery disease)    a. 03/2015 NSTEMI/PCI: LCX 177m (DES), OM1 100p (DES - vessel labeled Ramus in subsequent cath report); b. 04/2015 Cath: patent stents/stable anatomy; c. 02/2016 MV: No isch; d. 11/2018 PCI: LCX 95p/m (PTCA->20%); e. 01/2019 PCI: RCA 85p/m (2.5x32 Synergy DES); f. 01/2021 Cath: LM 30, LAD 40ost/p, 14m/d, LCX 55p restenosis, OM1 10,  OM2 40, RCA patent stent-->Med rx.   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 03/2015 Echo: EF 30-35%, mild concentric LVH, severe HK of inf, inflat, & lat walls, mod MR, mild TR;  b. 04/2015 LV Gram: EF 55-65%; c. 09/2017 Echo: EF 55-60%, no rwma. Mild MR; d. 11/2018 Echo: Ef 60-65%, no rwma; e. 06/2022 Echo: EF 60-65%, nl RV fxn, mildly dil LA, mild MR.   CKD (chronic kidney disease), stage IV (HCC)    Diabetes type 2, controlled (HCC)    a. since 2002    Diabetic gastroparesis (HCC)    a. chronic nausea/vomiting.   Diabetic retinopathy (HCC)    Diverticulosis, sigmoid 07/2016   Fatty liver    GERD (gastroesophageal reflux disease)    Heavy menses    a. H/O IUD - expired in 2014 - remains in place.   Hx of migraines    Hyperkalemia    a. On chronic lokelma - managed by nephrology.   Hyperlipidemia    Intolerant of atorvastatin, rosuvastatin   Hypertension    Iron deficiency anemia    Ischemic cardiomyopathy (resolved)    a. 03/2015 EF 30-35% post NSTEMI;  b. 04/2015 EF 55-65% on LV gram; c. 09/2017 Echo: EF 55-60%; d. 11/2018 Echo: Ef 60-65%; e. 06/2022 Echo: EF 60-65%.   Obesity    Tobacco abuse    a. quit 03/2015.   Uterine fibroid    Vertigo  Vitamin D deficiency    Past Surgical History: Past Surgical History:  Procedure Laterality Date   APPENDECTOMY     CARDIAC CATHETERIZATION  03/2015   x2 stent [facility]   CARDIAC CATHETERIZATION N/A 05/05/2015   Procedure: Left Heart Cath and Coronary Angiography;  Surgeon: Peter M Swaziland, MD;  Location: [facility] INVASIVE CV LAB;  Service: Cardiovascular;  Laterality: N/A;   CATARACT EXTRACTION Left 09/12/2021   CATARACT EXTRACTION Right 10/10/2021   COLONOSCOPY WITH PROPOFOL N/A 07/31/2016   Procedure: COLONOSCOPY WITH PROPOFOL;  Surgeon: Christena Deem, MD;  Location: [facility] ENDOSCOPY;  Service: Endoscopy;  Laterality: N/A;   CORONARY BALLOON  ANGIOPLASTY N/A 12/04/2018   Procedure: CORONARY BALLOON ANGIOPLASTY;  Surgeon: Iran Ouch, MD;  Location: [facility] INVASIVE CV LAB;  Service: Cardiovascular;  Laterality: N/A;   CORONARY PRESSURE/FFR STUDY N/A 12/04/2018   Procedure: INTRAVASCULAR PRESSURE WIRE/FFR STUDY;  Surgeon: Iran Ouch, MD;  Location: [facility] INVASIVE CV LAB;  Service: Cardiovascular;  Laterality: N/A;   CORONARY STENT INTERVENTION N/A 01/12/2019   Procedure: CORONARY STENT INTERVENTION;  Surgeon: Yvonne Kendall, MD;  Location: [facility] INVASIVE CV LAB;  Service: Cardiovascular;  Laterality: N/A;   ESOPHAGOGASTRODUODENOSCOPY (EGD) WITH PROPOFOL N/A 07/31/2016   Procedure: ESOPHAGOGASTRODUODENOSCOPY (EGD) WITH PROPOFOL;  Surgeon: Christena Deem, MD;  Location: [facility] ENDOSCOPY;  Service: Endoscopy;  Laterality: N/A;   EYE SURGERY  07/16/2022   Membrane peeled   LAPAROSCOPIC APPENDECTOMY N/A 08/07/2016   Procedure: APPENDECTOMY LAPAROSCOPIC;  Surgeon: Gladis Riffle, MD;  Location: [facility] ORS;  Service: General;  Laterality: N/A;   LEFT HEART CATH AND CORONARY ANGIOGRAPHY Left 12/04/2018   Procedure: LEFT HEART CATH AND CORONARY ANGIOGRAPHY;  Surgeon: Iran Ouch, MD;  Location: [facility] INVASIVE CV LAB;  Service: Cardiovascular;  Laterality: Left;   LEFT HEART CATH AND CORONARY ANGIOGRAPHY N/A 01/12/2019   Procedure: LEFT HEART CATH AND CORONARY ANGIOGRAPHY;  Surgeon: Yvonne Kendall, MD;  Location: [facility] INVASIVE CV LAB;  Service: Cardiovascular;  Laterality: N/A;   MOUTH SURGERY     RIGHT/LEFT HEART CATH AND CORONARY ANGIOGRAPHY N/A 01/29/2021   Procedure: RIGHT/LEFT HEART CATH AND CORONARY ANGIOGRAPHY;  Surgeon: Iran Ouch, MD;  Location: [facility] INVASIVE CV LAB;  Service: Cardiovascular;  Laterality: N/A;   VITRECTOMY Left 10/30/2021    Family History: Family History  Problem Relation Age of Onset   Diabetes Mother    Heart disease Father    Diabetes Father    Cancer - Cervical Maternal Aunt     Cancer Maternal Grandmother        lung   Cancer Maternal Grandfather        prostate   Diabetes Paternal Grandmother    Diabetes Paternal Grandfather     Social History:  Flooring in bedroom: Engineer, civil (consulting) Pets: cat and dog Tobacco use/exposure: 2007-2016 Job: disabled   Medication List:  Allergies as of 07/02/2023       Reactions   Lisinopril Anaphylaxis, Itching, Swelling   Other Itching, Swelling   [place] seasoning   Rosemary Oil Anaphylaxis   Shellfish Allergy Itching, Swelling   Pt states that she HAS had IV Contrast without any reaction   Statins Other (See Comments)   Myalgias   Metformin And Related Diarrhea, Nausea And Vomiting, Rash        Medication List        Accurate as of July 02, 2023 12:42 PM. If you have any questions, ask your nurse or doctor.  STOP taking these medications    Accu-Chek Aviva Plus test strip Generic drug: glucose blood Stopped by: Birder Robson       TAKE these medications    acetaminophen 500 MG tablet Commonly known as: TYLENOL Take 1,000 mg by mouth every 4 (four) hours as needed.   allopurinol 100 MG tablet Commonly known as: ZYLOPRIM Take 1/2 (one-half) tablet by mouth once daily   aspirin EC 81 MG tablet Take 81 mg by mouth daily.   azelastine 0.1 % nasal spray Commonly known as: ASTELIN Place 1 spray into both nostrils 2 (two) times daily as needed. Use in each nostril as directed Started by: Clerance Lav KwikPen 100 UNIT/ML Inject 100 Units into the skin daily.   Basaglar KwikPen 100 UNIT/ML Inject 110 Units into the skin at bedtime.   BD Pen Needle Micro U/F 32G X 6 MM Misc Generic drug: Insulin Pen Needle Inject into the skin.   carvedilol 12.5 MG tablet Commonly known as: COREG Take 1 tablet (12.5 mg total) by mouth 2 (two) times daily with a meal.   cetirizine 10 MG tablet Commonly known as: ZyrTEC Allergy Take 1 tablet (10 mg total) by mouth daily. What changed:  when  to take this reasons to take this Changed by: Birder Robson   clopidogrel 75 MG tablet Commonly known as: PLAVIX Take 1 tablet (75 mg total) by mouth daily. PLEASE CALL [phone] TO SCHEDULE AN APPOINTMENT FOR FURTHER REFILLS. THANK YOU. 1ST ATTEMPT   Dexcom G6 Receiver [facility]   Dexcom G6 Sensor Misc See admin instructions.   Dexcom G6 Transmitter Misc   EPINEPHrine 0.3 mg/0.3 mL Soaj injection Commonly known as: EPI-PEN Inject 0.3 mg into the muscle as needed for anaphylaxis. What changed:  how much to take how to take this when to take this reasons to take this Changed by: Birder Robson   etonogestrel 68 MG Impl implant Commonly known as: NEXPLANON 1 each (68 mg total) by Subdermal route once for 1 dose.   ezetimibe 10 MG tablet Commonly known as: ZETIA Take 1 tablet by mouth once daily   famotidine 40 MG tablet Commonly known as: PEPCID Take 1 tablet (40 mg total) by mouth daily.   furosemide 20 MG tablet Commonly known as: LASIX Take 20 mg by mouth daily.   insulin aspart 100 UNIT/ML injection Commonly known as: novoLOG Inject 10 Units into the skin 3 (three) times daily with meals.   irbesartan 150 MG tablet Commonly known as: AVAPRO Take 75 mg by mouth daily.   isosorbide mononitrate 120 MG 24 hr tablet Commonly known as: IMDUR Take 1 tablet (120 mg total) by mouth daily.   Lokelma 10 g [name] packet Generic drug: sodium zirconium cyclosilicate Take 10 g by mouth daily.   nitroGLYCERIN 0.4 MG SL tablet Commonly known as: NITROSTAT Take 1 tab under your tongue while sitting for chest pain, If no relief may repeat, one tab every 5 min up to 3 tabs total over 15 mins.   Olopatadine HCl 0.2 % Soln Apply 1 drop to eye daily as needed (itchy watery eyes). Started by: Birder Robson   pantoprazole 40 MG tablet Commonly known as: PROTONIX Take 1 tablet by mouth twice daily   Praluent 150 MG/ML Soaj Generic drug: Alirocumab INJECT 1 ML INTO THE SKIN  EVERY 14 DAYS   Semaglutide(0.25 or 0.5MG /DOS) 2 MG/3ML Sopn Inject 0.25 mg as directed once a week.   Xhance 93  MCG/ACT Exhu Generic drug: Fluticasone Propionate Place 1 spray into both nostrils 2 (two) times daily.         REVIEW OF SYSTEMS: Pertinent positives and negatives discussed in HPI.   Objective:   Physical Exam: BP 130/84 (BP Location: Left Arm, Patient Position: Sitting, Cuff Size: Large)   Pulse 93   Temp 98 F (36.7 C) (Temporal)   Resp 18   Ht 5\' 5"  (1.651 m)   Wt 285 lb 8 oz (129.5 kg)   SpO2 100%   BMI 47.51 kg/m  Body mass index is 47.51 kg/m. GEN: alert, well developed HEENT: clear conjunctiva, TM grey and translucent, nose with + inferior turbinate hypertrophy, pink nasal mucosa, slight clear rhinorrhea, + cobblestoning HEART: regular rate and rhythm, no murmur LUNGS: clear to auscultation bilaterally, no coughing, unlabored respiration ABDOMEN: soft, non distended  SKIN: no rashes or lesions  Reviewed:  05/28/2023: followed by Cardiology for CAD s/p PCI in 2019 and 2020, previously with low EF but has since recovered, HTN, CKD Stage IV, DM II. On lasix, coreg, irbesartan, lasix.  04/23/2023: seen by [facility] for hx of allergic rhinitis and nasal polyps. Receiving AIT with [facility] but had changes in insurance.  Nasal endoscopy showed L septal deviation, no polypoid tissue.  Discussed use of Xhance daily and OTC anti histamine daily.   06/23/2023: seen by Dr Tedd Sias for DM, on Ozempic and Glargine. A1C in June was 7.6%  Skin Testing:  Skin prick testing was placed, which includes aeroallergens/foods, histamine control, and saline control.  Verbal consent was obtained prior to placing test.  Patient tolerated procedure well.  Allergy testing results were read and interpreted by myself, documented by clinical staff. Adequate positive and negative control.  Results discussed with patient/family.  Airborne Adult Perc - 07/02/23 1040     Time Antigen  Placed 1040    Allergen Manufacturer Waynette Buttery    Location Back    Number of Test 55    2. Control-Histamine 3+    3. Bahia Negative    4. French Southern [place] Negative    5. Johnson 3+    6. Kentucky Blue 3+    7. Meadow Fescue 3+    8. Perennial Rye 3+    9. Timothy 3+    10. Ragweed Mix 2+    11. Cocklebur Negative    12. Plantain,  English 2+    13. Baccharis Negative    14. Dog Fennel 2+    15. Russian Thistle Negative    16. Lamb's Quarters Negative    17. Sheep Sorrell Negative    18. Rough Pigweed Negative    19. Marsh Elder, Rough 2+    20. Mugwort, Common Negative    21. Box, Elder Negative    22. Cedar, red Negative    23. Sweet Gum Negative    24. Pecan Pollen 3+    25. Pine Mix Negative    26. Walnut, Black Pollen 2+    27. Red Mulberry 2+    28. Ash Mix 2+    29. Birch Mix 2+    30. Beech American Negative    31. Cottonwood, Guinea-Bissau Negative    32. Hickory, White 3+    33. Maple Mix Negative    34. Oak, Guinea[place] Mix 3+    35. Sycamore Eastern Negative    36. Alternaria Alternata Negative    37. Cladosporium Herbarum 2+    38. Aspergillus Mix 2+    39. Penicillium Mix  Negative    40. Bipolaris Sorokiniana (Helminthosporium) Negative    41. Drechslera Spicifera (Curvularia) Negative    42. Mucor Plumbeus Negative    43. Fusarium Moniliforme 3+    44. Aureobasidium Pullulans (pullulara) Negative    45. Rhizopus Oryzae Negative    46. Botrytis Cinera Negative    47. Epicoccum Nigrum Negative    48. Phoma Betae Negative    49. Dust Mite Mix 3+    50. Cat Hair 10,000 BAU/ml 2+    51.  Dog Epithelia 2+    52. Mixed Feathers Negative    53. Horse Epithelia Negative    54. Cockroach, German Negative    55. Tobacco Leaf 3+             Food Adult Perc - 07/02/23 1000     Time Antigen Placed 1041    Allergen Manufacturer Waynette Buttery    Location Back    Number of allergen test 10    Control-Histamine 3+    8. Shellfish Mix Negative    23. Shrimp --   3x2   24. Crab  --   2x2   25. Lobster --   2x2   26. Oyster --   2x4   27. Scallops Negative               Assessment:   1. Seasonal allergic rhinitis due to pollen   2. History of nasal polyp   3. Pollen-food allergy, initial encounter   4. Gastroesophageal reflux disease, unspecified whether esophagitis present   5. Allergic rhinitis caused by mold   6. Allergic rhinitis due to animal hair or dander   7. Allergic rhinitis due to dust mite   8. Current use of beta blocker   9. 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   - Positive skin test 06/2023: trees, grasses, weeds, molds, cats, dogs, dust mite - Avoidance measures discussed. - Use nasal saline rinses before nose sprays such as with Neilmed Sinus Rinse.  Use distilled water.   - Use Xhance 1 spray each nostril twice daily. - Use Azelastine 1-2 sprays each nostril twice daily as needed for runny nose, drainage, sneezing, congestion. Aim upward and outward. - Use Zyrtec 10 mg daily.  - For eyes, use Olopatadine or Ketotifen 1 eye drop daily as needed for itchy, watery eyes.  Available over the counter, if not covered by insurance.  - Based on your significant cardiac and renal history with use of beta blocker, I do not think you are a good candidate for allergy shots.  There is a risk of life threatening reaction, if you were to react to the allergy shots. Also, most recent ENT visit did not show any polyps.   Food allergy:  - please strictly avoid shellfish. - SPT 06/2023 positive for shellfish. - Initial rxn: angioedema, dermatitis, pruritus with shellfish. - for SKIN only reaction, okay to take Benadryl 25mg  capsules every 6 hours as needed - for SKIN + ANY additional symptoms, OR IF concern for  LIFE THREATENING reaction = Epipen Autoinjector EpiPen 0.3 mg. - If using Epinephrine autoinjector, call 911 or go to the ER.    Oral Allergy Syndrome- Fruits  - These symptoms are typically not life-threatening and are because of a  cross reaction between a pollen you are allergic to, and to a protein in specific foods (such as fresh fruits, vegetables, and nuts). - If you can eat these things and tolerate the symptoms, it is fine to continue to do so.  If not, you may avoid these fresh fruits and vegetables.   - Heating these foods, buying them canned, and peeling these foods should allow them to be consumed without symptoms or with less symptoms.  GERD - Continue Protonix 20mg  daily in morning on empty stomach.  Eat a small meal or snack about 30-45 minutes after.  - Continue Pepcid 20mg  daily as needed.  -Avoid lying down for at least two hours after a meal or after drinking acidic beverages, like soda, or other caffeinated beverages. This can help to prevent stomach contents from flowing back into the esophagus. -Keep your head elevated while you sleep. Using an extra pillow or two can also help to prevent reflux. -Eat smaller and more frequent meals each day instead of a few large meals. This promotes digestion and can aid in preventing heartburn. -Wear loose-fitting clothes to ease pressure on the stomach, which can worsen heartburn and reflux. -Reduce excess weight around the midsection. This can ease pressure on the stomach. Such pressure can force some stomach contents back up the esophagus.        Return in about 6 weeks (around 08/13/2023).  Alesia Morin, MD [facility]

## 2023-07-16 ENCOUNTER — Encounter: Payer: Self-pay | Admitting: Nurse Practitioner

## 2023-07-16 ENCOUNTER — Ambulatory Visit: Payer: Medicare (Managed Care) | Attending: Nurse Practitioner | Admitting: Nurse Practitioner

## 2023-07-16 NOTE — Progress Notes (Deleted)
Office Visit    Patient Name: [name] [name] Date of Encounter: 07/16/2023  Primary Care Provider:  Marjie Skiff, NP Primary Cardiologist:  Lorine Bears, MD  Chief Complaint    42 y.o. 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 03/2015 NSTEMI/PCI: LCX 115m (DES), OM1 100p (DES - vessel labeled Ramus in subsequent cath report); b. 04/2015 Cath: patent stents/stable anatomy; c. 02/2016 MV: No isch; d. 11/2018 PCI: LCX 95p/m (PTCA->20%); e. 01/2019 PCI: RCA 85p/m (2.5x32 Synergy DES); f. 01/2021 Cath: LM 30, LAD 40ost/p, 18m/d, LCX 55p restenosis, OM1 10, OM2 40, RCA patent stent-->Med rx.   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 03/2015 Echo: EF 30-35%, mild concentric LVH, severe HK of inf, inflat, & lat walls, mod MR, mild TR;  b. 04/2015 LV Gram: EF 55-65%; c. 09/2017 Echo: EF 55-60%, no rwma. Mild MR; d. 11/2018 Echo: Ef 60-65%, no rwma; e. 06/2022 Echo: EF 60-65%, nl RV fxn, mildly dil LA, mild MR.   CKD (chronic kidney disease), stage IV (HCC)    Diabetes type 2, controlled (HCC)    a. since 2002    Diabetic gastroparesis (HCC)    a. chronic nausea/vomiting.   Diabetic retinopathy (HCC)    Diverticulosis, sigmoid 07/2016   Fatty liver    GERD (gastroesophageal reflux disease)    Heavy menses    a. H/O IUD - expired in 2014 - remains in place.   Hx of migraines    Hyperkalemia    a. On chronic lokelma - managed by nephrology.   Hyperlipidemia    Intolerant of atorvastatin, rosuvastatin   Hypertension    Iron deficiency anemia     Ischemic cardiomyopathy (resolved)    a. 03/2015 EF 30-35% post NSTEMI;  b. 04/2015 EF 55-65% on LV gram; c. 09/2017 Echo: EF 55-60%; d. 11/2018 Echo: Ef 60-65%; e. 06/2022 Echo: EF 60-65%.   Obesity    Tobacco abuse    a. quit 03/2015.   Uterine fibroid    Vertigo    Vitamin D deficiency    Past Surgical History:  Procedure Laterality Date   APPENDECTOMY     CARDIAC CATHETERIZATION  03/2015   x2 stent [facility]   CARDIAC CATHETERIZATION N/A 05/05/2015   Procedure: Left Heart Cath and Coronary Angiography;  Surgeon: Peter M Swaziland, MD;  Location: [facility] INVASIVE CV LAB;  Service: Cardiovascular;  Laterality: N/A;   CATARACT EXTRACTION Left 09/12/2021   CATARACT EXTRACTION Right 10/10/2021   COLONOSCOPY WITH PROPOFOL N/A 07/31/2016   Procedure: COLONOSCOPY WITH PROPOFOL;  Surgeon: Christena Deem, MD;  Location: [facility] ENDOSCOPY;  Service: Endoscopy;  Laterality: N/A;   CORONARY BALLOON ANGIOPLASTY N/A 12/04/2018   Procedure: CORONARY BALLOON ANGIOPLASTY;  Surgeon: Iran Ouch, MD;  Location: [facility] INVASIVE CV LAB;  Service: Cardiovascular;  Laterality: N/A;   CORONARY PRESSURE/FFR STUDY N/A 12/04/2018   Procedure: INTRAVASCULAR PRESSURE WIRE/FFR STUDY;  Surgeon: Iran Ouch, MD;  Location: [facility] INVASIVE CV LAB;  Service: Cardiovascular;  Laterality: N/A;   CORONARY STENT INTERVENTION N/A 01/12/2019   Procedure: CORONARY  STENT INTERVENTION;  Surgeon: Yvonne Kendall, MD;  Location: [facility] INVASIVE CV LAB;  Service: Cardiovascular;  Laterality: N/A;   ESOPHAGOGASTRODUODENOSCOPY (EGD) WITH PROPOFOL N/A 07/31/2016   Procedure: ESOPHAGOGASTRODUODENOSCOPY (EGD) WITH PROPOFOL;  Surgeon: Christena Deem, MD;  Location: [facility] ENDOSCOPY;  Service: Endoscopy;  Laterality: N/A;   EYE SURGERY  07/16/2022   Membrane peeled   LAPAROSCOPIC APPENDECTOMY N/A 08/07/2016   Procedure: APPENDECTOMY LAPAROSCOPIC;  Surgeon: Gladis Riffle, MD;  Location: [facility] ORS;  Service: General;  Laterality: N/A;    LEFT HEART CATH AND CORONARY ANGIOGRAPHY Left 12/04/2018   Procedure: LEFT HEART CATH AND CORONARY ANGIOGRAPHY;  Surgeon: Iran Ouch, MD;  Location: [facility] INVASIVE CV LAB;  Service: Cardiovascular;  Laterality: Left;   LEFT HEART CATH AND CORONARY ANGIOGRAPHY N/A 01/12/2019   Procedure: LEFT HEART CATH AND CORONARY ANGIOGRAPHY;  Surgeon: Yvonne Kendall, MD;  Location: [facility] INVASIVE CV LAB;  Service: Cardiovascular;  Laterality: N/A;   MOUTH SURGERY     RIGHT/LEFT HEART CATH AND CORONARY ANGIOGRAPHY N/A 01/29/2021   Procedure: RIGHT/LEFT HEART CATH AND CORONARY ANGIOGRAPHY;  Surgeon: Iran Ouch, MD;  Location: [facility] INVASIVE CV LAB;  Service: Cardiovascular;  Laterality: N/A;   VITRECTOMY Left 10/30/2021    Allergies  Allergies  Allergen Reactions   Lisinopril Anaphylaxis, Itching and Swelling   Other Itching and Swelling    [place] seasoning    Rosemary Oil Anaphylaxis   Shellfish Allergy Itching and Swelling    Pt states that she HAS had IV Contrast without any reaction   Statins Other (See Comments)    Myalgias   Metformin And Related Diarrhea, Nausea And Vomiting and Rash    History of Present Illness    42 y.o. 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 Cardiac history dates back to April 2016, when she was hospitalized with non-STEMI. She was found to have LV dysfunction with an EF of 30 to 35%. Diagnostic catheterization showed severe left circumflex and OM1 disease. Both areas were successfully treated with drug-eluting stents. Subsequent catheterization in May 2016, showed patency of both stents and normalization of LV function by ventriculography. She had nonischemic stress testing in March 2017, but in the setting of  recurrent angina in December 2019, she underwent diagnostic catheterization, which showed patent OM1 stent with focal in-stent restenosis in the left circumflex at the origin of the OM 2, and new significant stenosis in the proximal to mid right coronary artery. Balloon angioplasty was performed in the mid left circumflex and she subsequently underwent staged PCI of the RCA in early 2020. Echo in December 2019, showed an EF of 60 to 65%. In February 2022, she underwent right and left heart cardiac catheterization in the setting of chest pain and dyspnea. This showed patent stents in the RCA, left circumflex, and OM1. There was moderate restenosis in the mid left circumflex at the site of prior PTCA. She was medically managed.  Echocardiogram in July 2023, showed an EF of 60 to 65% with normal RV function, mildly dilated left atrium, and mild mitral regurgitation.    Ms. Gotto was last seen in cardiology clinic in 05/2023, at which time she reported intermittent, brief episodes of lightheadedness, and occasional, fleeting, sharp chest pain, which she attributed to anxiety and panic attacks.  In light of atypical symptoms, which she felt  were very different from prior angina, we deferred ischemic evaluation.  7 day zio monitor was ordered, though results are not available ***  Follow up labs through primary care on 05/29/2023 showed rise in creatinine to 2.48.  H/H was down slightly @ 9.9/32.9.  She subsequently underwent iron infusion in 06/2023.  Home Medications    Current Outpatient Medications  Medication Sig Dispense Refill   acetaminophen (TYLENOL) 500 MG tablet Take 1,000 mg by mouth every 4 (four) hours as needed.     Alirocumab (PRALUENT) 150 MG/ML SOAJ INJECT 1 ML INTO THE SKIN EVERY 14 DAYS 2 mL 11   allopurinol (ZYLOPRIM) 100 MG tablet Take 1/2 (one-half) tablet by mouth once daily 45 tablet 4   aspirin EC 81 MG tablet Take 81 mg by mouth daily.     azelastine (ASTELIN) 0.1 % nasal spray Place 1  spray into both nostrils 2 (two) times daily as needed. Use in each nostril as directed 30 mL 5   BD PEN NEEDLE MICRO U/F 32G X 6 MM MISC Inject into the skin.     carvedilol (COREG) 12.5 MG tablet Take 1 tablet (12.5 mg total) by mouth 2 (two) times daily with a meal. 60 tablet 1   cetirizine (ZYRTEC ALLERGY) 10 MG tablet Take 1 tablet (10 mg total) by mouth daily. 30 tablet 5   clopidogrel (PLAVIX) 75 MG tablet Take 1 tablet (75 mg total) by mouth daily. PLEASE CALL [phone] TO SCHEDULE AN APPOINTMENT FOR FURTHER REFILLS. THANK YOU. 1ST ATTEMPT 30 tablet 0   Continuous Blood Gluc Receiver (DEXCOM G6 RECEIVER) DEVI      Continuous Blood Gluc Sensor (DEXCOM G6 SENSOR) MISC See admin instructions.     Continuous Blood Gluc Transmit (DEXCOM G6 TRANSMITTER) MISC      EPINEPHrine 0.3 mg/0.3 mL IJ SOAJ injection Inject 0.3 mg into the muscle as needed for anaphylaxis. 2 each 1   etonogestrel (NEXPLANON) 68 MG IMPL implant 1 each (68 mg total) by Subdermal route once for 1 dose. 1 each 0   ezetimibe (ZETIA) 10 MG tablet Take 1 tablet by mouth once daily 90 tablet 3   famotidine (PEPCID) 40 MG tablet Take 1 tablet (40 mg total) by mouth daily. 90 tablet 4   furosemide (LASIX) 20 MG tablet Take 20 mg by mouth daily.     insulin aspart (NOVOLOG) 100 UNIT/ML injection Inject 10 Units into the skin 3 (three) times daily with meals. 10 mL 11   Insulin Glargine (BASAGLAR KWIKPEN) 100 UNIT/ML Inject 100 Units into the skin daily.     Insulin Glargine (BASAGLAR KWIKPEN) 100 UNIT/ML Inject 110 Units into the skin at bedtime.     irbesartan (AVAPRO) 150 MG tablet Take 75 mg by mouth daily.     isosorbide mononitrate (IMDUR) 120 MG 24 hr tablet Take 1 tablet (120 mg total) by mouth daily. 90 tablet 3   LOKELMA 10 g PACK packet Take 10 g by mouth daily.     nitroGLYCERIN (NITROSTAT) 0.4 MG SL tablet Take 1 tab under your tongue while sitting for chest pain, If no relief may repeat, one tab every 5 min up to 3  tabs total over 15 mins. 25 tablet 3   Olopatadine HCl 0.2 % SOLN Apply 1 drop to eye daily as needed (itchy watery eyes). 2.5 mL 5   pantoprazole (PROTONIX) 40 MG tablet Take 1 tablet by mouth twice daily 180 tablet 4   Semaglutide,0.25 or 0.5MG /DOS, 2 MG/3ML SOPN Inject  0.25 mg as directed once a week.     XHANCE 93 MCG/ACT EXHU Place 1 spray into both nostrils 2 (two) times daily.     No current facility-administered medications for this visit.   Facility-Administered Medications Ordered in Other Visits  Medication Dose Route Frequency Provider Last Rate Last Admin   sodium chloride flush (NS) 0.9 % injection 3 mL  3 mL Intravenous Q12H Marisue Ivan D, PA-C         Review of Systems    ***.  All other systems reviewed and are otherwise negative except as noted above.    Physical Exam    VS:  There were no vitals taken for this visit. , BMI There is no height or weight on file to calculate BMI.     GEN: Well nourished, well developed, in no acute distress. HEENT: normal. Neck: Supple, no JVD, carotid bruits, or masses. Cardiac: RRR, no murmurs, rubs, or gallops. No clubbing, cyanosis, edema.  Radials 2+/PT 2+ and equal bilaterally.  Respiratory:  Respirations regular and unlabored, clear to auscultation bilaterally. GI: Soft, nontender, nondistended, BS + x 4. MS: no deformity or atrophy. Skin: warm and dry, no rash. Neuro:  Strength and sensation are intact. Psych: Normal affect.  Accessory Clinical Findings    ECG personally reviewed by me today -    *** - no acute changes.  Lab Results  Component Value Date   WBC 6.4 05/29/2023   HGB 9.9 (L) 05/29/2023   HCT 32.9 (L) 05/29/2023   MCV 83 05/29/2023   PLT 350 05/29/2023   Lab Results  Component Value Date   CREATININE 2.48 (H) 05/29/2023   BUN 44 (H) 05/29/2023   NA 136 05/29/2023   K 5.0 05/29/2023   CL 102 05/29/2023   CO2 20 05/29/2023   Lab Results  Component Value Date   ALT 13 05/29/2023   AST  11 05/29/2023   GGT 487 (H) 06/03/2016   ALKPHOS 122 (H) 05/29/2023   BILITOT <0.2 05/29/2023   Lab Results  Component Value Date   CHOL 112 05/29/2023   HDL 40 05/29/2023   LDLCALC 53 05/29/2023   LDLDIRECT 163 (H) 12/17/2018   TRIG 102 05/29/2023   CHOLHDL 4.2 10/22/2021    Lab Results  Component Value Date   HGBA1C 7.6 (H) 05/29/2023    Assessment & Plan    1.  ***  Informed Consent    Nicolasa Ducking, NP 07/16/2023, 8:13 AM

## 2023-07-29 ENCOUNTER — Other Ambulatory Visit: Payer: Self-pay | Admitting: Cardiovascular Disease

## 2023-07-29 NOTE — Telephone Encounter (Signed)
Please contact pt for future appointment. Pt overdue for 1 month f/u. 

## 2023-07-31 ENCOUNTER — Other Ambulatory Visit: Payer: Self-pay | Admitting: Cardiovascular Disease

## 2023-07-31 NOTE — Telephone Encounter (Signed)
Could you please review chart and advise on how many refills to approve?  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  No Zio results and pt NS 07/16/23 appt with Ward Givens.

## 2023-08-13 ENCOUNTER — Ambulatory Visit: Payer: Medicare (Managed Care) | Admitting: Internal Medicine

## 2023-09-04 ENCOUNTER — Other Ambulatory Visit: Payer: Self-pay | Admitting: *Deleted

## 2023-09-04 DIAGNOSIS — D649 Anemia, unspecified: Secondary | ICD-10-CM

## 2023-09-05 ENCOUNTER — Inpatient Hospital Stay: Payer: Medicare (Managed Care) | Attending: Internal Medicine

## 2023-09-05 ENCOUNTER — Inpatient Hospital Stay (HOSPITAL_BASED_OUTPATIENT_CLINIC_OR_DEPARTMENT_OTHER): Payer: Medicare (Managed Care) | Admitting: Internal Medicine

## 2023-09-05 VITALS — BP 130/61 | HR 78 | Temp 99.0°F | Wt 276.0 lb

## 2023-09-05 DIAGNOSIS — I130 Hypertensive heart and chronic kidney disease with heart failure and stage 1 through stage 4 chronic kidney disease, or unspecified chronic kidney disease: Secondary | ICD-10-CM | POA: Insufficient documentation

## 2023-09-05 DIAGNOSIS — E785 Hyperlipidemia, unspecified: Secondary | ICD-10-CM | POA: Insufficient documentation

## 2023-09-05 DIAGNOSIS — E11319 Type 2 diabetes mellitus with unspecified diabetic retinopathy without macular edema: Secondary | ICD-10-CM | POA: Diagnosis not present

## 2023-09-05 DIAGNOSIS — K219 Gastro-esophageal reflux disease without esophagitis: Secondary | ICD-10-CM | POA: Diagnosis not present

## 2023-09-05 DIAGNOSIS — D631 Anemia in chronic kidney disease: Secondary | ICD-10-CM

## 2023-09-05 DIAGNOSIS — E1122 Type 2 diabetes mellitus with diabetic chronic kidney disease: Secondary | ICD-10-CM | POA: Diagnosis not present

## 2023-09-05 DIAGNOSIS — Z794 Long term (current) use of insulin: Secondary | ICD-10-CM | POA: Diagnosis not present

## 2023-09-05 DIAGNOSIS — N184 Chronic kidney disease, stage 4 (severe): Secondary | ICD-10-CM

## 2023-09-05 DIAGNOSIS — I2510 Atherosclerotic heart disease of native coronary artery without angina pectoris: Secondary | ICD-10-CM | POA: Insufficient documentation

## 2023-09-05 DIAGNOSIS — D649 Anemia, unspecified: Secondary | ICD-10-CM

## 2023-09-05 LAB — IRON AND TIBC
Iron: 55 ug/dL (ref 28–170)
Saturation Ratios: 19 % (ref 10.4–31.8)
TIBC: 283 ug/dL (ref 250–450)
UIBC: 228 ug/dL

## 2023-09-05 LAB — CBC WITH DIFFERENTIAL (CANCER CENTER ONLY)
Abs Immature Granulocytes: 0.02 10*3/uL (ref 0.00–0.07)
Basophils Absolute: 0 10*3/uL (ref 0.0–0.1)
Basophils Relative: 1 %
Eosinophils Absolute: 0.1 10*3/uL (ref 0.0–0.5)
Eosinophils Relative: 2 %
HCT: 34.5 % — ABNORMAL LOW (ref 36.0–46.0)
Hemoglobin: 10.6 g/dL — ABNORMAL LOW (ref 12.0–15.0)
Immature Granulocytes: 0 %
Lymphocytes Relative: 28 %
Lymphs Abs: 1.8 10*3/uL (ref 0.7–4.0)
MCH: 26.1 pg (ref 26.0–34.0)
MCHC: 30.7 g/dL (ref 30.0–36.0)
MCV: 85 fL (ref 80.0–100.0)
Monocytes Absolute: 0.4 10*3/uL (ref 0.1–1.0)
Monocytes Relative: 6 %
Neutro Abs: 4.1 10*3/uL (ref 1.7–7.7)
Neutrophils Relative %: 63 %
Platelet Count: 404 10*3/uL — ABNORMAL HIGH (ref 150–400)
RBC: 4.06 MIL/uL (ref 3.87–5.11)
RDW: 15.9 % — ABNORMAL HIGH (ref 11.5–15.5)
WBC Count: 6.4 10*3/uL (ref 4.0–10.5)
nRBC: 0 % (ref 0.0–0.2)

## 2023-09-05 LAB — FERRITIN: Ferritin: 102 ng/mL (ref 11–307)

## 2023-09-05 NOTE — Progress Notes (Signed)
[facility]  [phone] [phone]  ID: [name] OB: 03-Aug-1981  MR#: [id number]  VQQ#:[id number]  Patient Care Team: Marjie Skiff, NP as PCP - General (Nurse Practitioner) Iran Ouch, MD as PCP - Cardiology (Cardiology) Iran Ouch, MD as Consulting Physician (Cardiology) Delma Freeze, FNP as Nurse Practitioner (Family Medicine)  REASON FOR REFERRAL: Normocytic anemia  HPI: [name] [name] is a 42 y.o. female with past medical history of CKD stage IV likely secondary to diabetes, CAD status post PCI, CHF, diabetes, diabetic gastroparesis, diabetic retinopathy, GERD, migraines, iron deficiency anemia received iron infusion in 2019, hypertension, hyperlipidemia was referred to hematology for management of normocytic anemia.  Patient has chronic history of anemia and has been slowly progressing.  Reports history of iron infusions in March 2020.  Also reports history of blood transfusion about the same time.  She follows with Dr. Thedore Mins for CKD and was thought secondary to poorly controlled diabetes.  Previously tested for ANCA, ANA, complements in 2020 which was negative.  Interval history Patient was seen today as follow-up for anemia and labs. Her insurance did not cover Feraheme.  So was switched to Venofer and had 2 doses.  Did not notice any improvement in the feeling of cold or energy level.   REVIEW OF SYSTEMS:   ROS  As per HPI. Otherwise, a complete review of systems is negative.  PAST MEDICAL HISTORY: Past Medical History:  Diagnosis Date   CAD (coronary artery disease)    a. 03/2015 NSTEMI/PCI: LCX 148m (DES), OM1 100p (DES - vessel labeled Ramus in subsequent cath report); b. 04/2015 Cath: patent stents/stable anatomy; c. 02/2016 MV: No isch; d. 11/2018 PCI: LCX 95p/m (PTCA->20%); e. 01/2019 PCI: RCA 85p/m (2.5x32 Synergy DES); f. 01/2021 Cath: LM 30, LAD 40ost/p, 22m/d, LCX 55p restenosis, OM1 10, OM2 40, RCA  patent stent-->Med rx.   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 03/2015 Echo: EF 30-35%, mild concentric LVH, severe HK of inf, inflat, & lat walls, mod MR, mild TR;  b. 04/2015 LV Gram: EF 55-65%; c. 09/2017 Echo: EF 55-60%, no rwma. Mild MR; d. 11/2018 Echo: Ef 60-65%, no rwma; e. 06/2022 Echo: EF 60-65%, nl RV fxn, mildly dil LA, mild MR.   CKD (chronic kidney disease), stage IV (HCC)    Diabetes type 2, controlled (HCC)    a. since 2002    Diabetic gastroparesis (HCC)    a. chronic nausea/vomiting.   Diabetic retinopathy (HCC)    Diverticulosis, sigmoid 07/2016   Fatty liver    GERD (gastroesophageal reflux disease)    Heavy menses    a. H/O IUD - expired in 2014 - remains in place.   Hx of migraines    Hyperkalemia    a. On chronic lokelma - managed by nephrology.   Hyperlipidemia    Intolerant of atorvastatin, rosuvastatin   Hypertension    Iron deficiency anemia    Ischemic cardiomyopathy (resolved)    a. 03/2015 EF 30-35% post NSTEMI;  b. 04/2015 EF 55-65% on LV gram; c. 09/2017 Echo: EF 55-60%; d. 11/2018 Echo: Ef 60-65%; e. 06/2022 Echo: EF 60-65%.   Obesity    Tobacco abuse    a. quit 03/2015.   Uterine fibroid    Vertigo    Vitamin D deficiency     PAST SURGICAL HISTORY: Past Surgical History:  Procedure Laterality  Date   APPENDECTOMY     CARDIAC CATHETERIZATION  03/2015   x2 stent [facility]   CARDIAC CATHETERIZATION N/A 05/05/2015   Procedure: Left Heart Cath and Coronary Angiography;  Surgeon: Peter M Swaziland, MD;  Location: [facility] INVASIVE CV LAB;  Service: Cardiovascular;  Laterality: N/A;   CATARACT EXTRACTION Left 09/12/2021   CATARACT EXTRACTION Right 10/10/2021   COLONOSCOPY WITH PROPOFOL N/A 07/31/2016   Procedure: COLONOSCOPY WITH PROPOFOL;  Surgeon: Christena Deem, MD;  Location: [facility] ENDOSCOPY;  Service: Endoscopy;  Laterality: N/A;   CORONARY BALLOON  ANGIOPLASTY N/A 12/04/2018   Procedure: CORONARY BALLOON ANGIOPLASTY;  Surgeon: Iran Ouch, MD;  Location: [facility] INVASIVE CV LAB;  Service: Cardiovascular;  Laterality: N/A;   CORONARY PRESSURE/FFR STUDY N/A 12/04/2018   Procedure: INTRAVASCULAR PRESSURE WIRE/FFR STUDY;  Surgeon: Iran Ouch, MD;  Location: [facility] INVASIVE CV LAB;  Service: Cardiovascular;  Laterality: N/A;   CORONARY STENT INTERVENTION N/A 01/12/2019   Procedure: CORONARY STENT INTERVENTION;  Surgeon: Yvonne Kendall, MD;  Location: [facility] INVASIVE CV LAB;  Service: Cardiovascular;  Laterality: N/A;   ESOPHAGOGASTRODUODENOSCOPY (EGD) WITH PROPOFOL N/A 07/31/2016   Procedure: ESOPHAGOGASTRODUODENOSCOPY (EGD) WITH PROPOFOL;  Surgeon: Christena Deem, MD;  Location: [facility] ENDOSCOPY;  Service: Endoscopy;  Laterality: N/A;   EYE SURGERY  07/16/2022   Membrane peeled   LAPAROSCOPIC APPENDECTOMY N/A 08/07/2016   Procedure: APPENDECTOMY LAPAROSCOPIC;  Surgeon: Gladis Riffle, MD;  Location: [facility] ORS;  Service: General;  Laterality: N/A;   LEFT HEART CATH AND CORONARY ANGIOGRAPHY Left 12/04/2018   Procedure: LEFT HEART CATH AND CORONARY ANGIOGRAPHY;  Surgeon: Iran Ouch, MD;  Location: [facility] INVASIVE CV LAB;  Service: Cardiovascular;  Laterality: Left;   LEFT HEART CATH AND CORONARY ANGIOGRAPHY N/A 01/12/2019   Procedure: LEFT HEART CATH AND CORONARY ANGIOGRAPHY;  Surgeon: Yvonne Kendall, MD;  Location: [facility] INVASIVE CV LAB;  Service: Cardiovascular;  Laterality: N/A;   MOUTH SURGERY     RIGHT/LEFT HEART CATH AND CORONARY ANGIOGRAPHY N/A 01/29/2021   Procedure: RIGHT/LEFT HEART CATH AND CORONARY ANGIOGRAPHY;  Surgeon: Iran Ouch, MD;  Location: [facility] INVASIVE CV LAB;  Service: Cardiovascular;  Laterality: N/A;   VITRECTOMY Left 10/30/2021    FAMILY HISTORY: Family History  Problem Relation Age of Onset   Diabetes Mother    Heart disease Father    Diabetes Father    Cancer - Cervical Maternal Aunt     Cancer Maternal Grandmother        lung   Cancer Maternal Grandfather        prostate   Diabetes Paternal Grandmother    Diabetes Paternal Grandfather     HEALTH MAINTENANCE: Social History   Tobacco Use   Smoking status: Former    Current packs/day: 0.00    Types: Cigarettes    Start date: 03/09/2000    Quit date: 03/10/2015    Years since quitting: 8.4    Passive exposure: Current   Smokeless tobacco: Never  Vaping Use   Vaping status: Never Used  Substance Use Topics   Alcohol use: No    Alcohol/week: 0.0 standard drinks of alcohol   Drug use: Not Currently     Allergies  Allergen Reactions   Lisinopril Anaphylaxis, Itching and Swelling   Other Itching and Swelling    [place] seasoning    Rosemary Oil Anaphylaxis   Shellfish Allergy Itching and Swelling    Pt states that she HAS had IV Contrast without any reaction   Statins Other (See Comments)  Myalgias   Metformin And Related Diarrhea, Nausea And Vomiting and Rash    Current Outpatient Medications  Medication Sig Dispense Refill   acetaminophen (TYLENOL) 500 MG tablet Take 1,000 mg by mouth every 4 (four) hours as needed.     Alirocumab (PRALUENT) 150 MG/ML SOAJ INJECT 1 ML INTO THE SKIN EVERY 14 DAYS 2 mL 11   allopurinol (ZYLOPRIM) 100 MG tablet Take 1/2 (one-half) tablet by mouth once daily 45 tablet 4   aspirin EC 81 MG tablet Take 81 mg by mouth daily.     azelastine (ASTELIN) 0.1 % nasal spray Place 1 spray into both nostrils 2 (two) times daily as needed. Use in each nostril as directed 30 mL 5   BD PEN NEEDLE MICRO U/F 32G X 6 MM MISC Inject into the skin.     carvedilol (COREG) 12.5 MG tablet TAKE 1 TABLET BY MOUTH TWICE DAILY WITH A MEAL 180 tablet 3   cetirizine (ZYRTEC ALLERGY) 10 MG tablet Take 1 tablet (10 mg total) by mouth daily. 30 tablet 5   clopidogrel (PLAVIX) 75 MG tablet TAKE 1 TABLET BY MOUTH ONCE DAILY PLEASE  CALL  DRS  OFFICE  FOR  APPT  FOR  FURTHER  REFILLS 30 tablet 0   Continuous  Blood Gluc Receiver (DEXCOM G6 RECEIVER) DEVI      Continuous Blood Gluc Sensor (DEXCOM G6 SENSOR) MISC See admin instructions.     Continuous Blood Gluc Transmit (DEXCOM G6 TRANSMITTER) MISC      EPINEPHrine 0.3 mg/0.3 mL IJ SOAJ injection Inject 0.3 mg into the muscle as needed for anaphylaxis. 2 each 1   etonogestrel (NEXPLANON) 68 MG IMPL implant 1 each (68 mg total) by Subdermal route once for 1 dose. 1 each 0   ezetimibe (ZETIA) 10 MG tablet Take 1 tablet by mouth once daily 90 tablet 3   famotidine (PEPCID) 40 MG tablet Take 1 tablet (40 mg total) by mouth daily. 90 tablet 4   furosemide (LASIX) 20 MG tablet Take 20 mg by mouth daily.     insulin aspart (NOVOLOG) 100 UNIT/ML injection Inject 10 Units into the skin 3 (three) times daily with meals. 10 mL 11   Insulin Glargine (BASAGLAR KWIKPEN) 100 UNIT/ML Inject 100 Units into the skin daily.     Insulin Glargine (BASAGLAR KWIKPEN) 100 UNIT/ML Inject 110 Units into the skin at bedtime.     irbesartan (AVAPRO) 150 MG tablet Take 75 mg by mouth daily.     isosorbide mononitrate (IMDUR) 120 MG 24 hr tablet Take 1 tablet (120 mg total) by mouth daily. 90 tablet 3   LOKELMA 10 g PACK packet Take 10 g by mouth daily.     nitroGLYCERIN (NITROSTAT) 0.4 MG SL tablet Take 1 tab under your tongue while sitting for chest pain, If no relief may repeat, one tab every 5 min up to 3 tabs total over 15 mins. 25 tablet 3   Olopatadine HCl 0.2 % SOLN Apply 1 drop to eye daily as needed (itchy watery eyes). 2.5 mL 5   pantoprazole (PROTONIX) 40 MG tablet Take 1 tablet by mouth twice daily 180 tablet 4   Semaglutide,0.25 or 0.5MG /DOS, 2 MG/3ML SOPN Inject 0.25 mg as directed once a week.     XHANCE 93 MCG/ACT EXHU Place 1 spray into both nostrils 2 (two) times daily.     No current facility-administered medications for this visit.   Facility-Administered Medications Ordered in Other Visits  Medication Dose Route Frequency  Provider Last Rate Last Admin    sodium chloride flush (NS) 0.9 % injection 3 mL  3 mL Intravenous Q12H Visser, Jacquelyn D, PA-C        OBJECTIVE: Vitals:   09/05/23 1005  BP: 130/61  Pulse: 78  Temp: 99 F (37.2 C)  SpO2: 100%      Body mass index is 45.93 kg/m.      General: Well-developed, well-nourished, no acute distress. Eyes: Pink conjunctiva, anicteric sclera. HEENT: Normocephalic, moist mucous membranes, clear oropharnyx. Lungs: Clear to auscultation bilaterally. Heart: Regular rate and rhythm. No rubs, murmurs, or gallops. Abdomen: Soft, nontender, nondistended. No organomegaly noted, normoactive bowel sounds. Musculoskeletal: No edema, cyanosis, or clubbing. Neuro: Alert, answering all questions appropriately. Cranial nerves grossly intact. Skin: No rashes or petechiae noted. Psych: Normal affect. Lymphatics: No cervical, calvicular, axillary or inguinal LAD.   LAB RESULTS:  Lab Results  Component Value Date   NA 136 05/29/2023   K 5.0 05/29/2023   CL 102 05/29/2023   CO2 20 05/29/2023   GLUCOSE 212 (H) 05/29/2023   BUN 44 (H) 05/29/2023   CREATININE 2.48 (H) 05/29/2023   CALCIUM 9.0 05/29/2023   PROT 6.9 05/29/2023   ALBUMIN 3.9 05/29/2023   AST 11 05/29/2023   ALT 13 05/29/2023   ALKPHOS 122 (H) 05/29/2023   BILITOT <0.2 05/29/2023   GFRNONAA 34 (L) 08/06/2022   GFRAA 46 (L) 01/10/2021    Lab Results  Component Value Date   WBC 6.4 09/05/2023   NEUTROABS 4.1 09/05/2023   HGB 10.6 (L) 09/05/2023   HCT 34.5 (L) 09/05/2023   MCV 85.0 09/05/2023   PLT 404 (H) 09/05/2023    Lab Results  Component Value Date   TIBC 332 06/05/2023   TIBC 274 08/06/2021   TIBC 324 02/15/2019   FERRITIN 42 06/05/2023   FERRITIN 131 08/06/2021   FERRITIN 23 02/15/2019   IRONPCTSAT 11 06/05/2023   IRONPCTSAT 24 08/06/2021   IRONPCTSAT 11 02/15/2019     STUDIES: No results found.  ASSESSMENT AND PLAN:   Vernese Vanderpoel is a 42 y.o. female with past medical history of CKD stage IV likely  secondary to diabetes, CAD status post PCI, CHF, diabetes, diabetic gastroparesis, diabetic retinopathy, GERD, migraines, iron deficiency anemia received iron infusion in 2019, hypertension, hyperlipidemia was referred to hematology for management of normocytic anemia.  # Normocytic anemia # CKD stage IV likely secondary to poorly controlled diabetes - Anemia secondary to chronic kidney disease and mild iron deficiency. - Labs reviewed from 05/29/2023.  Creatinine 2.48.  GFR 24.   -SPEP/IFE no M protein, kappa 64, lambda 39 with ratio 1.66, EPO 14.7, folate normal, B12 294, ferritin 42, saturation 11%, iron 36.  Her insurance did not cover Feraheme.  Completed IV Venofer 200 mg x 2 doses.  Will schedule her for 2 more doses.  Hemoglobin today is 10.6.  Iron panel is pending.  No indication for EPO injections.  Orders Placed This Encounter  Procedures   CBC with Differential/Platelet   Iron and TIBC   Ferritin   Vitamin B12   Comprehensive metabolic panel   CBC with Differential/Platelet   Ferritin   Iron and TIBC([facility])   RTC in 5 months for MD visit, labs, possible Venofer  Patient expressed understanding and was in agreement with this plan. She also understands that She can call clinic at any time with any questions, concerns, or complaints.   I spent a total of 25 minutes reviewing chart data, face-to-face  evaluation with the patient, counseling and coordination of care as detailed above.  Michaelyn Barter, MD   09/05/2023 10:27 AM

## 2023-09-05 NOTE — Patient Instructions (Signed)
Start vitamin b12 supplements 1000 mcg once daily over the counter.

## 2023-09-05 NOTE — Progress Notes (Signed)
Patient stays cold, plus her feet and her hands are getting numb to where it is painful at night.

## 2023-09-12 ENCOUNTER — Other Ambulatory Visit: Payer: Self-pay | Admitting: Cardiovascular Disease

## 2023-09-12 NOTE — Telephone Encounter (Signed)
Good Morning,  Could you schedule this patient an overdue follow up appointment? The patient was last seen by C.Berge on 05-28-23. Thank you so much.

## 2023-09-15 ENCOUNTER — Other Ambulatory Visit: Payer: Self-pay | Admitting: Cardiovascular Disease

## 2023-09-15 ENCOUNTER — Ambulatory Visit: Payer: Medicare (Managed Care) | Attending: Internal Medicine

## 2023-09-15 MED ORDER — CLOPIDOGREL BISULFATE 75 MG PO TABS: 75.0000 mg | ORAL_TABLET | Freq: Every day | ORAL | 0 refills | Status: DC

## 2023-10-14 ENCOUNTER — Encounter: Payer: Self-pay | Admitting: Nurse Practitioner

## 2023-10-19 NOTE — Patient Instructions (Signed)
Rash, Adult  A rash is a breakout of spots or blotches on the skin. It can change the way your skin looks and feels. Many things can cause a rash. The goal of treatment is to stop the itching and keep the rash from spreading. Follow these instructions at home: Medicine Take or apply over-the-counter and prescription medicines only as told by your doctor. These may include medicines to treat: Red or swollen skin. Itching. An allergy. Pain. An infection.  Skin care Put a cool, wet cloth on the rash. Do not scratch or rub your skin. Try not to cover the rash. Keep it exposed to air as often as you can. Managing itching and discomfort Avoid hot showers or baths. These can make itching worse. A cold shower may help. Try taking a bath with: Epsom salts. You can get these at your pharmacy or grocery store. Follow the instructions on the package. Baking soda. Pour a small amount into the bath as told by your doctor. Colloidal oatmeal. You can get this at your pharmacy or grocery store. Follow the instructions on the package. Try putting baking soda paste on your skin. Stir water into baking soda until it gets like a paste. Try putting on a lotion to help with itching (calamine lotion). Keep cool. Stay out of the sun. Sweating and being hot can make itching worse. General instructions  Rest as needed. Drink enough fluid to keep your pee (urine) pale yellow. Wear loose-fitting clothes. Avoid scented soaps, detergents, and perfumes. Use gentle soaps, detergents, perfumes, and cosmetics. Avoid the things that cause your rash. Keep a journal to help keep track of what causes your rash. Write down: What you eat. What cosmetics you use. What you drink. What you wear. This includes jewelry. Contact a doctor if: You sweat a lot at night. You pee (urinate) more or less than normal. Your pee is a darker color than normal. Your eyes are sensitive to light. Your skin or the white parts of your  eyes turn yellow. Your skin tingles or is numb. You get painful blisters in your nose or mouth. Your rash does not go away after a few days, or it gets worse. You are more tired than normal. You are more thirsty than normal. You have new or worse symptoms. These may include: Pain in your belly. A fever. Watery poop (diarrhea). Vomiting. Weakness. Weight loss. Get help right away if: You start to feel mixed up (confused). You have a very bad headache or a stiff neck. You have very bad joint pain or stiffness. You get very sleepy or not responsive. You have a seizure. This information is not intended to replace advice given to you by your health care provider. Make sure you discuss any questions you have with your health care provider. Document Revised: 09/13/2022 Document Reviewed: 09/13/2022 Elsevier Patient Education  2024 Elsevier Inc.  

## 2023-10-21 ENCOUNTER — Ambulatory Visit (INDEPENDENT_AMBULATORY_CARE_PROVIDER_SITE_OTHER): Payer: Medicare (Managed Care) | Admitting: Nurse Practitioner

## 2023-10-21 ENCOUNTER — Encounter: Payer: Self-pay | Admitting: Nurse Practitioner

## 2023-10-21 VITALS — BP 136/82 | HR 83 | Temp 98.8°F | Wt 275.6 lb

## 2023-10-21 DIAGNOSIS — Z23 Encounter for immunization: Secondary | ICD-10-CM

## 2023-10-21 DIAGNOSIS — B029 Zoster without complications: Secondary | ICD-10-CM | POA: Diagnosis not present

## 2023-10-21 DIAGNOSIS — R21 Rash and other nonspecific skin eruption: Secondary | ICD-10-CM

## 2023-10-21 MED ORDER — ACETAMINOPHEN-CODEINE 300-30 MG PO TABS: 1.0000 | ORAL_TABLET | Freq: Four times a day (QID) | ORAL | 0 refills | Status: AC | PRN

## 2023-10-21 NOTE — Assessment & Plan Note (Signed)
Acute and improved at this time.  However, headache still present.  She is restricted on medications she can take due to CHF and CKD.  Will send in Tylenol 3 for a 5 day supply as has taken this in past without issue.  Have highly recommended if rash appears again to be seen immediately for treatment.  Educated her on shingles and risks.  To get Shingrix at age 42.

## 2023-10-21 NOTE — Progress Notes (Signed)
BP 136/82   Pulse 83   Temp 98.8 F (37.1 C) (Oral)   Wt 275 lb 9.6 oz (125 kg)   LMP  (LMP Unknown)   SpO2 98%   BMI 45.86 kg/m    Subjective:    Patient ID: [name] [name], female    DOB: 17-Oct-1981, 42 y.o.   MRN: [id number]  HPI: [name] [name] is a 42 y.o. female  Chief Complaint  Patient presents with   Rash    Patient states she had a painful rash that last for a couple of weeks. States it has gone away now but was making her feel bad when she had it. States she has noticed the rash last year around this time as well. States she used hydrocortisone and neosporin when she had the rash, hydrocortisone helped more than the neosporin. States she would also have headaches when the rash was present.    RASH Had rash to back of left side neck that was painful and had blisters.  She felt sick at the time and felt feverish when present.  This lasted for few weeks.  Rash has improved, but still feels weak and tired + headache.  Has had similar in past to neck area. Duration:  weeks 3 weeks Location:  neck   Itching: no Burning: yes Redness: yes Oozing: yes Scaling: no Blisters: yes Painful: no Fevers:  felt it at time of rash Change in detergents/soaps/personal care products: no Recent illness: no Recent travel:no History of same: yes always on her neck area Context: better rash but headache remains Alleviating factors: hydrocortisone cream helped rash, but nothing is helping is headache -- taking Tylenol, can not take Ibuprofen Treatments attempted:hydrocortisone cream Shortness of breath: no  Throat/tongue swelling: no Myalgias/arthralgias: yes to neck area  Relevant past medical, surgical, family and social history reviewed and updated as indicated. Interim medical history since our last visit reviewed. Allergies and medications reviewed and updated.  Review of Systems  Constitutional:  Positive for fatigue. Negative for activity change, appetite change, diaphoresis  and fever.  Respiratory:  Negative for cough, chest tightness, shortness of breath and wheezing.   Cardiovascular:  Negative for chest pain, palpitations and leg swelling.  Gastrointestinal: Negative.   Musculoskeletal:  Positive for myalgias.  Skin:  Positive for rash.  Neurological:  Positive for headaches.  Psychiatric/Behavioral: Negative.      Per HPI unless specifically indicated above     Objective:    BP 136/82   Pulse 83   Temp 98.8 F (37.1 C) (Oral)   Wt 275 lb 9.6 oz (125 kg)   LMP  (LMP Unknown)   SpO2 98%   BMI 45.86 kg/m   Wt Readings from Last 3 Encounters:  10/21/23 275 lb 9.6 oz (125 kg)  09/05/23 276 lb (125.2 kg)  07/02/23 285 lb 8 oz (129.5 kg)    Physical Exam Vitals and nursing note reviewed.  Constitutional:      General: She is awake. She is not in acute distress.    Appearance: She is well-developed and well-groomed. She is obese. She is not ill-appearing or toxic-appearing.  HENT:     Head: Normocephalic.     Right Ear: Hearing and external ear normal.     Left Ear: Hearing and external ear normal.  Eyes:     General: Lids are normal.        Right eye: No discharge.        Left eye: No discharge.  Conjunctiva/sclera: Conjunctivae normal.     Pupils: Pupils are equal, round, and reactive to light.  Neck:     Thyroid: No thyromegaly.     Vascular: No carotid bruit.  Cardiovascular:     Rate and Rhythm: Normal rate and regular rhythm.     Heart sounds: Normal heart sounds. No murmur heard.    No gallop.  Pulmonary:     Effort: Pulmonary effort is normal. No accessory muscle usage or respiratory distress.     Breath sounds: Normal breath sounds.  Abdominal:     General: Bowel sounds are normal. There is no distension.     Palpations: Abdomen is soft.     Tenderness: There is no abdominal tenderness.  Musculoskeletal:     Cervical back: Normal range of motion and neck supple.     Right lower leg: No edema.     Left lower leg: No  edema.  Lymphadenopathy:     Cervical: No cervical adenopathy.  Skin:    General: Skin is warm and dry.     Findings: Rash present.     Comments: To left side of neck area with crusted over raised clusters noted.  No drainage or redness.  Neurological:     Mental Status: She is alert and oriented to person, place, and time.     Cranial Nerves: Cranial nerves 2-12 are intact.     Gait: Gait is intact.     Deep Tendon Reflexes: Reflexes are normal and symmetric.     Reflex Scores:      Brachioradialis reflexes are 2+ on the right side and 2+ on the left side.      Patellar reflexes are 2+ on the right side and 2+ on the left side. Psychiatric:        Attention and Perception: Attention normal.        Mood and Affect: Mood normal.        Speech: Speech normal.        Behavior: Behavior normal. Behavior is cooperative.        Thought Content: Thought content normal.     Results for orders placed or performed in visit on 09/05/23  Ferritin  Result Value Ref Range   Ferritin 102 11 - 307 ng/mL  Iron and TIBC([facility])  Result Value Ref Range   Iron 55 28 - 170 ug/dL   TIBC 161 096 - 045 ug/dL   Saturation Ratios 19 10.4 - 31.8 %   UIBC 228 ug/dL  CBC with Differential (Cancer Center Only)  Result Value Ref Range   WBC Count 6.4 4.0 - 10.5 K/uL   RBC 4.06 3.87 - 5.11 MIL/uL   Hemoglobin 10.6 (L) 12.0 - 15.0 g/dL   HCT 40.9 (L) 81.1 - 91.4 %   MCV 85.0 80.0 - 100.0 fL   MCH 26.1 26.0 - 34.0 pg   MCHC 30.7 30.0 - 36.0 g/dL   RDW 78.2 (H) 95.6 - 21.3 %   Platelet Count 404 (H) 150 - 400 K/uL   nRBC 0.0 0.0 - 0.2 %   Neutrophils Relative % 63 %   Neutro Abs 4.1 1.7 - 7.7 K/uL   Lymphocytes Relative 28 %   Lymphs Abs 1.8 0.7 - 4.0 K/uL   Monocytes Relative 6 %   Monocytes Absolute 0.4 0.1 - 1.0 K/uL   Eosinophils Relative 2 %   Eosinophils Absolute 0.1 0.0 - 0.5 K/uL   Basophils Relative 1 %   Basophils Absolute 0.0 0.0 -  0.1 K/uL   Immature Granulocytes 0 %   Abs  Immature Granulocytes 0.02 0.00 - 0.07 K/uL      Assessment & Plan:   Problem List Items Addressed This Visit       Nervous and Auditory   Shingles - Primary    Acute and improved at this time.  However, headache still present.  She is restricted on medications she can take due to CHF and CKD.  Will send in Tylenol 3 for a 5 day supply as has taken this in past without issue.  Have highly recommended if rash appears again to be seen immediately for treatment.  Educated her on shingles and risks.  To get Shingrix at age 36.      Other Visit Diagnoses     Flu vaccine need       Flu vaccine today, educated patient on this.   Relevant Orders   Flu vaccine trivalent PF, 6mos and older(Flulaval,Afluria,Fluarix,Fluzone) (Completed)        Follow up plan: Return for as scheduled December 20th.

## 2023-10-23 ENCOUNTER — Ambulatory Visit: Payer: Medicare (Managed Care)

## 2023-10-24 LAB — HM DIABETES EYE EXAM

## 2023-10-24 NOTE — Telephone Encounter (Signed)
Called pt and left a message for follow up appointment

## 2023-10-28 ENCOUNTER — Ambulatory Visit: Payer: Medicare (Managed Care) | Admitting: Nurse Practitioner

## 2023-11-05 ENCOUNTER — Encounter: Payer: Self-pay | Admitting: Nurse Practitioner

## 2023-11-10 ENCOUNTER — Ambulatory Visit (INDEPENDENT_AMBULATORY_CARE_PROVIDER_SITE_OTHER): Payer: Medicare (Managed Care) | Admitting: Family Medicine

## 2023-11-10 VITALS — BP 147/82 | HR 87 | Temp 98.4°F | Ht 66.14 in | Wt 274.8 lb

## 2023-11-10 DIAGNOSIS — B029 Zoster without complications: Secondary | ICD-10-CM

## 2023-11-10 MED ORDER — VALACYCLOVIR HCL 1 G PO TABS: 1000.0000 mg | ORAL_TABLET | Freq: Two times a day (BID) | ORAL | 0 refills | Status: AC

## 2023-11-10 MED ORDER — VALACYCLOVIR HCL 1 G PO TABS: 1000.0000 mg | ORAL_TABLET | Freq: Three times a day (TID) | ORAL | 0 refills | Status: DC

## 2023-11-10 NOTE — Assessment & Plan Note (Signed)
Acute, stable. Will treat with Valtrex 1000 MG BID for 7 days, provided handout on shingles, recommend cold compress to the area for relief. Return as needed, instructed to follow up if no improvement in symptoms after completing antiviral.

## 2023-11-10 NOTE — Progress Notes (Signed)
BP (!) 147/82   Pulse 87   Temp 98.4 F (36.9 C) (Oral)   Ht 5' 6.14" (1.68 m)   Wt 274 lb 12.8 oz (124.6 kg)   LMP  (LMP Unknown)   SpO2 99%   BMI 44.16 kg/m    Subjective:    Patient ID: [name] [name], female    DOB: 1981-08-26, 42 y.o.   MRN: [id number]  HPI: [name] [name] is a 42 y.o. female  Chief Complaint  Patient presents with   Rash   RASH Noticed the same rash that appeared as shingles earlier last month that went away, has now reappeared 5 days ago. She denies rash appearing in other places.  Duration:  5 days  Location:  back of neck    Itching: yes Burning: no Redness: no Oozing: no Scaling: no Blisters: no Painful: yes Fevers: yes low grade  Change in detergents/soaps/personal care products: no Recent illness: yes cough Recent travel:no History of same: yes used Cortizone cream previously Context: worse Alleviating factors: nothing Treatments attempted:nothing Shortness of breath: no  Throat/tongue swelling: no Myalgias/arthralgias: No  Relevant past medical, surgical, family and social history reviewed and updated as indicated. Interim medical history since our last visit reviewed. Allergies and medications reviewed and updated.  Review of Systems  Constitutional:  Positive for fever (low grade). Negative for activity change, appetite change, chills, diaphoresis and fatigue.  Respiratory: Negative.    Cardiovascular: Negative.   Skin:  Positive for rash.    Per HPI unless specifically indicated above     Objective:    BP (!) 147/82   Pulse 87   Temp 98.4 F (36.9 C) (Oral)   Ht 5' 6.14" (1.68 m)   Wt 274 lb 12.8 oz (124.6 kg)   LMP  (LMP Unknown)   SpO2 99%   BMI 44.16 kg/m   Wt Readings from Last 3 Encounters:  11/10/23 274 lb 12.8 oz (124.6 kg)  10/21/23 275 lb 9.6 oz (125 kg)  09/05/23 276 lb (125.2 kg)    Physical Exam Vitals and nursing note reviewed.  Constitutional:      General: She is awake. She is not in acute  distress.    Appearance: Normal appearance. She is well-developed and well-groomed. She is morbidly obese. She is not ill-appearing.  HENT:     Head: Normocephalic and atraumatic.     Right Ear: Hearing and external ear normal. No drainage.     Left Ear: Hearing and external ear normal. No drainage.     Nose: Nose normal.  Eyes:     General: Lids are normal.        Right eye: No discharge.        Left eye: No discharge.     Conjunctiva/sclera: Conjunctivae normal.  Cardiovascular:     Rate and Rhythm: Normal rate and regular rhythm.     Pulses:          Radial pulses are 2+ on the right side and 2+ on the left side.       Posterior tibial pulses are 2+ on the right side and 2+ on the left side.     Heart sounds: Normal heart sounds, S1 normal and S2 normal. No murmur heard.    No gallop.  Pulmonary:     Effort: Pulmonary effort is normal. No accessory muscle usage or respiratory distress.     Breath sounds: Normal breath sounds.  Musculoskeletal:        General: Normal range  of motion.     Cervical back: Full passive range of motion without pain and normal range of motion.     Right lower leg: No edema.     Left lower leg: No edema.  Skin:    General: Skin is warm and dry.     Capillary Refill: Capillary refill takes less than 2 seconds.     Findings: Rash present. Rash is pustular and scaling. Rash is not papular.  Neurological:     Mental Status: She is alert and oriented to person, place, and time.  Psychiatric:        Attention and Perception: Attention normal.        Mood and Affect: Mood normal.        Speech: Speech normal.        Behavior: Behavior normal. Behavior is cooperative.        Thought Content: Thought content normal.     Results for orders placed or performed in visit on 09/05/23  Ferritin  Result Value Ref Range   Ferritin 102 11 - 307 ng/mL  Iron and TIBC([facility])  Result Value Ref Range   Iron 55 28 - 170 ug/dL   TIBC 829 562 - 130 ug/dL    Saturation Ratios 19 10.4 - 31.8 %   UIBC 228 ug/dL  CBC with Differential ([facility] Only)  Result Value Ref Range   WBC Count 6.4 4.0 - 10.5 K/uL   RBC 4.06 3.87 - 5.11 MIL/uL   Hemoglobin 10.6 (L) 12.0 - 15.0 g/dL   HCT 86.5 (L) 78.4 - 69.6 %   MCV 85.0 80.0 - 100.0 fL   MCH 26.1 26.0 - 34.0 pg   MCHC 30.7 30.0 - 36.0 g/dL   RDW 29.5 (H) 28.4 - 13.2 %   Platelet Count 404 (H) 150 - 400 K/uL   nRBC 0.0 0.0 - 0.2 %   Neutrophils Relative % 63 %   Neutro Abs 4.1 1.7 - 7.7 K/uL   Lymphocytes Relative 28 %   Lymphs Abs 1.8 0.7 - 4.0 K/uL   Monocytes Relative 6 %   Monocytes Absolute 0.4 0.1 - 1.0 K/uL   Eosinophils Relative 2 %   Eosinophils Absolute 0.1 0.0 - 0.5 K/uL   Basophils Relative 1 %   Basophils Absolute 0.0 0.0 - 0.1 K/uL   Immature Granulocytes 0 %   Abs Immature Granulocytes 0.02 0.00 - 0.07 K/uL      Assessment & Plan:   Problem List Items Addressed This Visit     Shingles - Primary    Acute, stable. Will treat with Valtrex 1000 MG BID for 7 days, provided handout on shingles, recommend cold compress to the area for relief. Return as needed, instructed to follow up if no improvement in symptoms after completing antiviral.       Relevant Medications   valACYclovir (VALTREX) 1000 MG tablet     Follow up plan: Return if symptoms worsen or fail to improve.

## 2023-11-18 NOTE — Telephone Encounter (Signed)
Tried to call pt again for a follow appointment  so pt can get medications

## 2023-11-20 ENCOUNTER — Encounter: Payer: Self-pay | Admitting: Internal Medicine

## 2023-11-23 DIAGNOSIS — Z8619 Personal history of other infectious and parasitic diseases: Secondary | ICD-10-CM | POA: Insufficient documentation

## 2023-11-24 NOTE — Telephone Encounter (Signed)
Called pt again and left a message to schedule an appointment to be seen

## 2023-11-28 ENCOUNTER — Ambulatory Visit: Payer: Medicare (Managed Care) | Admitting: Nurse Practitioner

## 2023-11-28 DIAGNOSIS — E1129 Type 2 diabetes mellitus with other diabetic kidney complication: Secondary | ICD-10-CM

## 2023-11-28 DIAGNOSIS — E559 Vitamin D deficiency, unspecified: Secondary | ICD-10-CM

## 2023-11-28 DIAGNOSIS — I130 Hypertensive heart and chronic kidney disease with heart failure and stage 1 through stage 4 chronic kidney disease, or unspecified chronic kidney disease: Secondary | ICD-10-CM

## 2023-11-28 DIAGNOSIS — I25118 Atherosclerotic heart disease of native coronary artery with other forms of angina pectoris: Secondary | ICD-10-CM

## 2023-11-28 DIAGNOSIS — E1122 Type 2 diabetes mellitus with diabetic chronic kidney disease: Secondary | ICD-10-CM

## 2023-11-28 DIAGNOSIS — E1169 Type 2 diabetes mellitus with other specified complication: Secondary | ICD-10-CM

## 2023-11-28 DIAGNOSIS — I5032 Chronic diastolic (congestive) heart failure: Secondary | ICD-10-CM

## 2023-11-28 DIAGNOSIS — Z794 Long term (current) use of insulin: Secondary | ICD-10-CM

## 2023-11-28 DIAGNOSIS — E875 Hyperkalemia: Secondary | ICD-10-CM

## 2023-11-28 DIAGNOSIS — N2581 Secondary hyperparathyroidism of renal origin: Secondary | ICD-10-CM

## 2023-11-28 DIAGNOSIS — E1143 Type 2 diabetes mellitus with diabetic autonomic (poly)neuropathy: Secondary | ICD-10-CM

## 2023-12-06 LAB — HEMOGLOBIN A1C: A1c: 8.3

## 2023-12-06 NOTE — Patient Instructions (Signed)
Be Involved in Caring For Your Health:  Taking Medications When medications are taken as directed, they can greatly improve your health. But if they are not taken as prescribed, they may not work. In some cases, not taking them correctly can be harmful. To help ensure your treatment remains effective and safe, understand your medications and how to take them. Bring your medications to each visit for review by your provider.  Your lab results, notes, and after visit summary will be available on My Chart. We strongly encourage you to use this feature. If lab results are abnormal the clinic will contact you with the appropriate steps. If the clinic does not contact you assume the results are satisfactory. You can always view your results on My Chart. If you have questions regarding your health or results, please contact the clinic during office hours. You can also ask questions on My Chart.  We at [facility] are grateful that you chose [place] to provide your care. We strive to provide evidence-based and compassionate care and are always looking for feedback. If you get a survey from the clinic please complete this so we can hear your opinions.  Diabetes Mellitus and Foot Care Diabetes, also called diabetes mellitus, may cause problems with your feet and legs because of poor blood flow (circulation). Poor circulation may make your skin: Become thinner and drier. Break more easily. Heal more slowly. Peel and crack. You may also have nerve damage (neuropathy). This can cause decreased feeling in your legs and feet. This means that you may not notice minor injuries to your feet that could lead to more serious problems. Finding and treating problems early is the best way to prevent future foot problems. How to care for your feet Foot hygiene  Wash your feet daily with warm water and mild soap. Do not use hot water. Then, pat your feet and the areas between your toes until they are fully dry. Do  not soak your feet. This can dry your skin. Trim your toenails straight across. Do not dig under them or around the cuticle. File the edges of your nails with an emery board or nail file. Apply a moisturizing lotion or petroleum jelly to the skin on your feet and to dry, brittle toenails. Use lotion that does not contain alcohol and is unscented. Do not apply lotion between your toes. Shoes and socks Wear clean socks or stockings every day. Make sure they are not too tight. Do not wear knee-high stockings. These may decrease blood flow to your legs. Wear shoes that fit well and have enough cushioning. Always look in your shoes before you put them on to be sure there are no objects inside. To break in new shoes, wear them for just a few hours a day. This prevents injuries on your feet. Wounds, scrapes, corns, and calluses  Check your feet daily for blisters, cuts, bruises, sores, and redness. If you cannot see the bottom of your feet, use a mirror or ask someone for help. Do not cut off corns or calluses or try to remove them with medicine. If you find a minor scrape, cut, or break in the skin on your feet, keep it and the skin around it clean and dry. You may clean these areas with mild soap and water. Do not clean the area with peroxide, alcohol, or iodine. If you have a wound, scrape, corn, or callus on your foot, look at it several times a day to make sure it  is healing and not infected. Check for: Redness, swelling, or pain. Fluid or blood. Warmth. Pus or a bad smell. General tips Do not cross your legs. This may decrease blood flow to your feet. Do not use heating pads or hot water bottles on your feet. They may burn your skin. If you have lost feeling in your feet or legs, you may not know this is happening until it is too late. Protect your feet from hot and cold by wearing shoes, such as at the beach or on hot pavement. Schedule a complete foot exam at least once a year or more often if  you have foot problems. Report any cuts, sores, or bruises to your health care provider right away. Where to find more information American Diabetes Association: diabetes.org Association of Diabetes Care & Education Specialists: diabeteseducator.org Contact a health care provider if: You have a condition that increases your risk of infection, and you have any cuts, sores, or bruises on your feet. You have an injury that is not healing. You have redness on your legs or feet. You feel burning or tingling in your legs or feet. You have pain or cramps in your legs and feet. Your legs or feet are numb. Your feet always feel cold. You have pain around any toenails. Get help right away if: You have a wound, scrape, corn, or callus on your foot and: You have signs of infection. You have a fever. You have a red line going up your leg. This information is not intended to replace advice given to you by your health care provider. Make sure you discuss any questions you have with your health care provider. Document Revised: 05/29/2022 Document Reviewed: 05/29/2022 Elsevier Patient Education  2024 [employer].

## 2023-12-10 ENCOUNTER — Encounter: Payer: Self-pay | Admitting: Internal Medicine

## 2023-12-12 ENCOUNTER — Ambulatory Visit: Payer: Medicare Other | Admitting: Nurse Practitioner

## 2023-12-12 DIAGNOSIS — E559 Vitamin D deficiency, unspecified: Secondary | ICD-10-CM

## 2023-12-12 DIAGNOSIS — E1122 Type 2 diabetes mellitus with diabetic chronic kidney disease: Secondary | ICD-10-CM

## 2023-12-12 DIAGNOSIS — I130 Hypertensive heart and chronic kidney disease with heart failure and stage 1 through stage 4 chronic kidney disease, or unspecified chronic kidney disease: Secondary | ICD-10-CM

## 2023-12-12 DIAGNOSIS — Z6841 Body Mass Index (BMI) 40.0 and over, adult: Secondary | ICD-10-CM

## 2023-12-12 DIAGNOSIS — N183 Chronic kidney disease, stage 3 unspecified: Secondary | ICD-10-CM

## 2023-12-12 DIAGNOSIS — E1169 Type 2 diabetes mellitus with other specified complication: Secondary | ICD-10-CM

## 2023-12-12 DIAGNOSIS — I25118 Atherosclerotic heart disease of native coronary artery with other forms of angina pectoris: Secondary | ICD-10-CM

## 2023-12-12 DIAGNOSIS — N2581 Secondary hyperparathyroidism of renal origin: Secondary | ICD-10-CM

## 2023-12-12 DIAGNOSIS — I5032 Chronic diastolic (congestive) heart failure: Secondary | ICD-10-CM

## 2023-12-12 DIAGNOSIS — E1143 Type 2 diabetes mellitus with diabetic autonomic (poly)neuropathy: Secondary | ICD-10-CM

## 2023-12-12 DIAGNOSIS — N184 Chronic kidney disease, stage 4 (severe): Secondary | ICD-10-CM

## 2023-12-12 DIAGNOSIS — Z0000 Encounter for general adult medical examination without abnormal findings: Secondary | ICD-10-CM

## 2023-12-12 DIAGNOSIS — E875 Hyperkalemia: Secondary | ICD-10-CM

## 2023-12-12 DIAGNOSIS — Z794 Long term (current) use of insulin: Secondary | ICD-10-CM

## 2023-12-12 NOTE — Patient Instructions (Signed)

## 2023-12-13 NOTE — Progress Notes (Signed)
 Error

## 2023-12-19 ENCOUNTER — Encounter: Payer: Self-pay | Admitting: Nurse Practitioner

## 2023-12-19 ENCOUNTER — Ambulatory Visit (INDEPENDENT_AMBULATORY_CARE_PROVIDER_SITE_OTHER): Payer: Medicare Other | Admitting: Nurse Practitioner

## 2023-12-19 VITALS — BP 132/76 | HR 80 | Temp 97.6°F | Wt 274.4 lb

## 2023-12-19 DIAGNOSIS — N183 Chronic kidney disease, stage 3 unspecified: Secondary | ICD-10-CM

## 2023-12-19 DIAGNOSIS — E559 Vitamin D deficiency, unspecified: Secondary | ICD-10-CM | POA: Diagnosis not present

## 2023-12-19 DIAGNOSIS — E1143 Type 2 diabetes mellitus with diabetic autonomic (poly)neuropathy: Secondary | ICD-10-CM | POA: Diagnosis not present

## 2023-12-19 DIAGNOSIS — Z0000 Encounter for general adult medical examination without abnormal findings: Secondary | ICD-10-CM

## 2023-12-19 DIAGNOSIS — E1169 Type 2 diabetes mellitus with other specified complication: Secondary | ICD-10-CM | POA: Diagnosis not present

## 2023-12-19 DIAGNOSIS — G4733 Obstructive sleep apnea (adult) (pediatric): Secondary | ICD-10-CM

## 2023-12-19 DIAGNOSIS — K3184 Gastroparesis: Secondary | ICD-10-CM | POA: Diagnosis not present

## 2023-12-19 DIAGNOSIS — I130 Hypertensive heart and chronic kidney disease with heart failure and stage 1 through stage 4 chronic kidney disease, or unspecified chronic kidney disease: Secondary | ICD-10-CM

## 2023-12-19 DIAGNOSIS — I25118 Atherosclerotic heart disease of native coronary artery with other forms of angina pectoris: Secondary | ICD-10-CM | POA: Diagnosis not present

## 2023-12-19 DIAGNOSIS — I5032 Chronic diastolic (congestive) heart failure: Secondary | ICD-10-CM

## 2023-12-19 DIAGNOSIS — I700 Atherosclerosis of aorta: Secondary | ICD-10-CM

## 2023-12-19 DIAGNOSIS — E1122 Type 2 diabetes mellitus with diabetic chronic kidney disease: Secondary | ICD-10-CM

## 2023-12-19 DIAGNOSIS — Z794 Long term (current) use of insulin: Secondary | ICD-10-CM | POA: Diagnosis not present

## 2023-12-19 DIAGNOSIS — N2581 Secondary hyperparathyroidism of renal origin: Secondary | ICD-10-CM | POA: Diagnosis not present

## 2023-12-19 DIAGNOSIS — E1129 Type 2 diabetes mellitus with other diabetic kidney complication: Secondary | ICD-10-CM

## 2023-12-19 DIAGNOSIS — R809 Proteinuria, unspecified: Secondary | ICD-10-CM

## 2023-12-19 DIAGNOSIS — E66813 Obesity, class 3: Secondary | ICD-10-CM

## 2023-12-19 DIAGNOSIS — N184 Chronic kidney disease, stage 4 (severe): Secondary | ICD-10-CM | POA: Diagnosis not present

## 2023-12-19 DIAGNOSIS — J0110 Acute frontal sinusitis, unspecified: Secondary | ICD-10-CM

## 2023-12-19 DIAGNOSIS — E785 Hyperlipidemia, unspecified: Secondary | ICD-10-CM | POA: Diagnosis not present

## 2023-12-19 LAB — MICROALBUMIN, URINE WAIVED
Creatinine, Urine Waived: 100 mg/dL (ref 10–300)
Microalb, Ur Waived: 150 mg/L — ABNORMAL HIGH (ref 0–19)
Microalb/Creat Ratio: >300 mg/g — ABNORMAL HIGH (ref <30)

## 2023-12-19 MED ORDER — FAMOTIDINE 40 MG PO TABS: 40.0000 mg | ORAL_TABLET | Freq: Every day | ORAL | 4 refills | Status: DC

## 2023-12-19 MED ORDER — AMOXICILLIN-POT CLAVULANATE 875-125 MG PO TABS: 1.0000 | ORAL_TABLET | Freq: Two times a day (BID) | ORAL | 0 refills | Status: AC

## 2023-12-19 NOTE — Assessment & Plan Note (Signed)
 Chronic, ongoing, followed by nephrology.  Recent notes and labs reviewed.  Continue current medications as prescribed by them.  CMP today and urine ALB today.  Urine ALB 150 June 2024. A1c 8.3% recently with endocrinology.  Continue collaboration with nephrology and endocrinology, she will discuss GLP1 with them. - Cholesterol lowering medication and ARB on board - Eye and foot exam up to date - Vaccinations up to date

## 2023-12-19 NOTE — Assessment & Plan Note (Signed)
Chronic, ongoing, followed by cardiology.  Euvolemic today.  Continue current medication regimen as prescribed by them.  Recommend: - Reminded to call for an overnight weight gain of >2 pounds or a weekly weight gain of >5 pounds - not adding salt to food and read food labels. Reviewed the importance of keeping daily sodium intake to <2000mg daily - Avoid all NSAID products 

## 2023-12-19 NOTE — Assessment & Plan Note (Signed)
Chronic, ongoing.  Continue supplement daily and check level today.

## 2023-12-19 NOTE — Assessment & Plan Note (Signed)
 Chronic.  Noted on past imaging.  Continue Plavix and Praluent for prevention.

## 2023-12-19 NOTE — Assessment & Plan Note (Signed)
 Chronic, stable.  BP at goal in office today.  Recommend she monitor BP at least a few mornings a week at home and document.  DASH diet at home.  Continue current medication regimen and adjust as needed -- continue collaboration with cardiology.  Labs today: CMP, Mag, urine ALB.  Urine ALB 150 June 2024.

## 2023-12-19 NOTE — Assessment & Plan Note (Signed)
 Acute and ongoing for over a month now, waxing and waning.  Will defer Covid and Flu testing, as is past treatment dates.  Start Augmentin  BID for 7 days.  Recommend: - Increasing Fluids - Acetaminophen  / ibuprofen  as needed for fever/pain.  - Salt water gargling, chloraseptic spray and throat lozenges - OTC Coricidin or Diabetic Tussin - Mucinex .  - Humidifying the air.

## 2023-12-19 NOTE — Assessment & Plan Note (Signed)
Chronic, stable, followed by cardiology.  Continue this collaboration and medication regimen as prescribed by them. 

## 2023-12-19 NOTE — Assessment & Plan Note (Signed)
Chronic, ongoing, followed by nephrology.  Continue this collaboration - recent note and labs reviewed. 

## 2023-12-19 NOTE — Assessment & Plan Note (Addendum)
 Chronic, ongoing, followed by nephrology.  Recent notes and labs reviewed.  Continue current medications as prescribed by them.  CMP today and urine ALB today.  Urine ALB 150 June 2024. A1c 8.3% recently with endocrinology.  Continue collaboration with nephrology and endocrinology, she will discuss GLP1 with them. - Cholesterol lowering medication and ARB on board - Eye and foot exam up to date - Vaccinations up to date - Check Alpha Gal next visit as symptoms have improved with going to vegan diet with no red meat or pork.

## 2023-12-19 NOTE — Assessment & Plan Note (Signed)
Chronic, ongoing with poor tolerance to statins.  Followed by cardiology.  Continue Praluent and Zetia, as is intolerant to statins.  Lipid panel today.

## 2023-12-19 NOTE — Assessment & Plan Note (Signed)
Ongoing, continue collaboration with endocrinology. 

## 2023-12-19 NOTE — Assessment & Plan Note (Signed)
 BMI 44.10 with T2DM, HF, CKD.  Recommended eating smaller high protein, low fat meals more frequently and exercising 30 mins a day 5 times a week with a goal of 10-15lb weight loss in the next 3 months. Patient voiced their understanding and motivation to adhere to these recommendations.

## 2023-12-19 NOTE — Progress Notes (Signed)
 BP 132/76 (BP Location: Left Arm, Cuff Size: Large)   Pulse 80   Temp 97.6 F (36.4 C) (Oral)   Wt 274 lb 6.4 oz (124.5 kg)   SpO2 98%   BMI 44.10 kg/m    Subjective:    Patient ID: [name] [name], female    DOB: 1980/12/25, 43 y.o.   MRN: [id number]  HPI: [name] [name] is a 43 y.o. female  Chief Complaint  Patient presents with   Chronic Kidney Disease   Congestive Heart Failure   Diabetes    Eye exam requested from Dr. Pecen   Gastroesophageal Reflux   Hyperlipidemia   Hypertension   DIABETES WITH GASTROPARESIS A1c 8.3% on 10/06/23 with endocrinology, Dr. Damian, they increased insulin  dosing.  Basaglar  taking 110 units daily and Novolog  sliding scale.   Hypoglycemic episodes:no Polydipsia/polyuria: no Visual disturbance: no Chest pain: no Paresthesias: no Glucose Monitoring: yes  Accucheck frequency: BID - varies, 181 this morning fasting  Fasting glucose:   Post prandial:  Evening: varies  Before meals: Taking Insulin ?: yes  Long acting insulin : 110 Basaglar   Short acting insulin : sliding scale Blood Pressure Monitoring: daily Retinal Examination: Up to Date -- November 2024, [facility] Foot Exam: Up to Date Pneumovax: Up to Date Influenza: Up to Date Aspirin : yes   HYPERTENSION / HYPERLIPIDEMIA/HF/AORTIC ATHEROSCLEROSIS Follows with cardiology, last saw 05/29/23.  No recent NTG use, has not had to use for awhile.  Last echo in July 2023 showed EF 60-65%.  Uses CPAP, but is working on getting more tubing. Satisfied with current treatment? yes Duration of hypertension: chronic BP monitoring frequency: daily BP range: 120-130/80 range sometimes higher or lower, 103/63 yesterday BP medication side effects: no Duration of hyperlipidemia: chronic Cholesterol supplements: none Medication compliance: good compliance Aspirin : yes Recent stressors: no Recurrent headaches: no Visual changes: no Palpitations: occasional Dyspnea: no Chest pain: no Lower  extremity edema: no Dizzy/lightheaded: no  CHRONIC KIDNEY DISEASE Follows with nephrology, last visit 10/09/23 with CRT 1.96, eGFR 32, and PTH 70. CKD status: stable Medications renally dose: yes Previous renal evaluation: yes Pneumovax:  Up to Date Influenza Vaccine:  Up to Date   UPPER RESPIRATORY TRACT INFECTION Has been feeling bad on and off since Thanksgiving.  Current symptoms have been present a good little bit. Fever: yes Cough: occasional Shortness of breath: no Wheezing: no Chest pain: no Chest tightness: no Chest congestion: no Nasal congestion: yes Runny nose: yes Post nasal drip: yes Sneezing: no Sore throat: yes Swollen glands: yes under right arm Sinus pressure: yes Headache: yes Face pain: yes Toothache: yes Ear pain: yes bilateral Ear pressure: yes bilateral Eyes red/itching:yes Eye drainage/crusting: no  Vomiting:  occasional Rash: no Fatigue: yes Sick contacts: no Strep contacts: no  Context: fluctuating Recurrent sinusitis: no Relief with OTC cold/cough medications: yes  Treatments attempted: Coricidin       12/19/2023    8:31 AM 11/10/2023    4:11 PM 10/21/2023    9:54 AM 06/05/2023   11:13 AM 05/29/2023    8:54 AM  Depression screen PHQ 2/9  Decreased Interest 0 0 0 0 0  Down, Depressed, Hopeless 0 0 0 0 0  PHQ - 2 Score 0 0 0 0 0  Altered sleeping 2 0 2 0 0  Tired, decreased energy 2 1 2  0 2  Change in appetite 0 0 2 0 0  Feeling bad or failure about yourself  0 0 0 0 0  Trouble concentrating 0 0 0 0  0  Moving slowly or fidgety/restless 0 0 0 0 0  Suicidal thoughts 0 0 0 0 0  PHQ-9 Score 4 1 6  0 2  Difficult doing work/chores Somewhat difficult Not difficult at all Somewhat difficult  Somewhat difficult       12/19/2023    8:32 AM 11/10/2023    4:11 PM 10/21/2023    9:54 AM 05/29/2023    8:55 AM  GAD 7 : Generalized Anxiety Score  Nervous, Anxious, on Edge 0 0 0 0  Control/stop worrying 0 0 0 0  Worry too much - different  things 0 0 0 0  Trouble relaxing 0 0 0 0  Restless 2 0 0 0  Easily annoyed or irritable 0 0 0 0  Afraid - awful might happen 0 0 0 0  Total GAD 7 Score 2 0 0 0  Anxiety Difficulty Somewhat difficult Not difficult at all Not difficult at all Not difficult at all     Relevant past medical, surgical, family and social history reviewed and updated as indicated. Interim medical history since our last visit reviewed. Allergies and medications reviewed and updated.  Review of Systems  Constitutional:  Positive for fatigue and fever (occasional, none recent). Negative for activity change, appetite change and diaphoresis.  HENT:  Positive for congestion, ear pain, postnasal drip, rhinorrhea, sinus pressure, sinus pain, sneezing and sore throat. Negative for ear discharge and voice change.   Respiratory:  Negative for cough, chest tightness, shortness of breath and wheezing.   Cardiovascular:  Negative for chest pain, palpitations and leg swelling.  Gastrointestinal:  Positive for nausea. Negative for abdominal distention, abdominal pain, constipation, diarrhea and vomiting.  Endocrine: Negative for cold intolerance, heat intolerance, polydipsia, polyphagia and polyuria.  Neurological:  Positive for headaches. Negative for dizziness, syncope, weakness, light-headedness and numbness.  Psychiatric/Behavioral: Negative.      Per HPI unless specifically indicated above     Objective:    BP 132/76 (BP Location: Left Arm, Cuff Size: Large)   Pulse 80   Temp 97.6 F (36.4 C) (Oral)   Wt 274 lb 6.4 oz (124.5 kg)   SpO2 98%   BMI 44.10 kg/m   Wt Readings from Last 3 Encounters:  12/19/23 274 lb 6.4 oz (124.5 kg)  11/10/23 274 lb 12.8 oz (124.6 kg)  10/21/23 275 lb 9.6 oz (125 kg)    Physical Exam Vitals and nursing note reviewed.  Constitutional:      General: She is awake. She is not in acute distress.    Appearance: She is well-developed and well-groomed. She is obese. She is  ill-appearing. She is not toxic-appearing.  HENT:     Head: Normocephalic.     Right Ear: Hearing, ear canal and external ear normal. A middle ear effusion is present. Tympanic membrane is not injected or perforated.     Left Ear: Hearing, ear canal and external ear normal. A middle ear effusion is present. Tympanic membrane is not injected or perforated.     Nose: Rhinorrhea present. Rhinorrhea is clear.     Right Sinus: Frontal sinus tenderness present. No maxillary sinus tenderness.     Left Sinus: Frontal sinus tenderness present. No maxillary sinus tenderness.     Mouth/Throat:     Mouth: Mucous membranes are moist.     Pharynx: Posterior oropharyngeal erythema (mild) present. No pharyngeal swelling or oropharyngeal exudate.     Tonsils: 2+ on the right. 2+ on the left.  Eyes:  General: Lids are normal.        Right eye: No discharge.        Left eye: No discharge.     Conjunctiva/sclera: Conjunctivae normal.     Pupils: Pupils are equal, round, and reactive to light.  Neck:     Thyroid : No thyromegaly.     Vascular: No carotid bruit.  Cardiovascular:     Rate and Rhythm: Normal rate and regular rhythm.     Heart sounds: Normal heart sounds. No murmur heard.    No gallop.  Pulmonary:     Effort: Pulmonary effort is normal. No accessory muscle usage or respiratory distress.     Breath sounds: Wheezing present. No decreased breath sounds or rhonchi.  Abdominal:     General: Bowel sounds are normal.     Palpations: Abdomen is soft. There is no hepatomegaly or splenomegaly.  Musculoskeletal:     Cervical back: Normal range of motion and neck supple.     Right lower leg: No edema.     Left lower leg: No edema.  Lymphadenopathy:     Head:     Right side of head: Submandibular adenopathy present. No submental, tonsillar, preauricular or posterior auricular adenopathy.     Left side of head: Submandibular adenopathy present. No submental, tonsillar, preauricular or posterior  auricular adenopathy.     Cervical: No cervical adenopathy.     Upper Body:     Right upper body: Axillary adenopathy present.     Left upper body: Axillary adenopathy present.  Skin:    General: Skin is warm and dry.  Neurological:     Mental Status: She is alert and oriented to person, place, and time.  Psychiatric:        Attention and Perception: Attention normal.        Mood and Affect: Mood normal.        Speech: Speech normal.        Behavior: Behavior normal. Behavior is cooperative.        Thought Content: Thought content normal.    Diabetic Foot Exam - Simple   Simple Foot Form Visual Inspection No deformities, no ulcerations, no other skin breakdown bilaterally: Yes Sensation Testing Intact to touch and monofilament testing bilaterally: Yes Pulse Check Posterior Tibialis and Dorsalis pulse intact bilaterally: Yes Comments      Results for orders placed or performed in visit on 12/12/23  Hemoglobin A1c   Collection Time: 10/06/23 12:00 AM  Result Value Ref Range   A1c 8.3%       Assessment & Plan:   Problem List Items Addressed This Visit       Cardiovascular and Mediastinum   Chronic diastolic heart failure (HCC) (Chronic)   Chronic, ongoing, followed by cardiology.  Euvolemic today.  Continue current medication regimen as prescribed by them.  Recommend: - Reminded to call for an overnight weight gain of >2 pounds or a weekly weight gain of >5 pounds - not adding salt to food and read food labels. Reviewed the importance of keeping daily sodium intake to 2000mg  daily - Avoid all NSAID products      Relevant Orders   Magnesium    Atherosclerosis of aorta (HCC)   Chronic.  Noted on past imaging.  Continue Plavix  and Praluent  for prevention.      Coronary artery disease of native artery of native heart with stable angina pectoris (HCC)   Chronic, stable, followed by cardiology.  Continue this collaboration and medication regimen as  prescribed by them.       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  BP at goal in office today.  Recommend she monitor BP at least a few mornings a week at home and document.  DASH diet at home.  Continue current medication regimen and adjust as needed -- continue collaboration with cardiology.  Labs today: CMP, Mag, urine ALB.  Urine ALB 150 June 2024.           Respiratory   Acute frontal sinusitis   Acute and ongoing for over a month now, waxing and waning.  Will defer Covid and Flu testing, as is past treatment dates.  Start Augmentin  BID for 7 days.  Recommend: - Increasing Fluids - Acetaminophen  / ibuprofen  as needed for fever/pain.  - Salt water gargling, chloraseptic spray and throat lozenges - OTC Coricidin or Diabetic Tussin - Mucinex .  - Humidifying the air.       Relevant Medications   amoxicillin -clavulanate (AUGMENTIN ) 875-125 MG tablet     Digestive   Diabetic gastroparesis associated with type 2 diabetes mellitus (HCC) - Primary   Chronic, ongoing, followed by nephrology.  Recent notes and labs reviewed.  Continue current medications as prescribed by them.  CMP today and urine ALB today.  Urine ALB 150 June 2024. A1c 8.3% recently with endocrinology.  Continue collaboration with nephrology and endocrinology, she will discuss GLP1 with them. - Cholesterol lowering medication and ARB on board - Eye and foot exam up to date - Vaccinations up to date - Check Alpha Gal next visit as symptoms have improved with going to vegan diet with no red meat or pork.      Relevant Orders   Microalbumin, Urine Waived     Endocrine   CKD stage 4 due to type 2 diabetes mellitus (HCC)   Chronic, ongoing, followed by nephrology.  Recent notes and labs reviewed.  Continue current medications as prescribed by them.  CMP today and urine ALB today.  Urine ALB 150 June 2024. A1c 8.3% recently with endocrinology.  Continue collaboration with nephrology and  endocrinology, she will discuss GLP1 with them. - Cholesterol lowering medication and ARB on board - Eye and foot exam up to date - Vaccinations up to date      Relevant Orders   Microalbumin, Urine Waived   Hyperlipidemia associated with type 2 diabetes mellitus (HCC)   Chronic, ongoing with poor tolerance to statins.  Followed by cardiology.  Continue Praluent  and Zetia , as is intolerant to statins.  Lipid panel today.      Relevant Orders   Lipid Panel w/o Chol/HDL Ratio   Comprehensive metabolic panel   Hyperparathyroidism due to renal insufficiency (HCC)   Chronic, ongoing, followed by nephrology.  Continue this collaboration - recent note and labs reviewed.      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)   Chronic, ongoing, followed by nephrology.  Recent notes and labs reviewed.  Continue current medications as prescribed by them.  CMP today and urine ALB today.  Urine ALB 150 June 2024. A1c 8.3% recently with endocrinology.  Continue collaboration with nephrology and endocrinology, she will discuss GLP1 with them. - Cholesterol lowering medication and ARB on board - Eye and foot exam up to date - Vaccinations up to date        Other   Long term (current) use of insulin  [facility])   Ongoing, continue collaboration with endocrinology.  Obesity   BMI 44.10 with T2DM, HF, CKD.  Recommended eating smaller high protein, low fat meals more frequently and exercising 30 mins a day 5 times a week with a goal of 10-15lb weight loss in the next 3 months. Patient voiced their understanding and motivation to adhere to these recommendations.       Vitamin D  deficiency   Chronic, ongoing.  Continue supplement daily and check level today.      Relevant Orders   VITAMIN D  25 Hydroxy (Vit-D Deficiency, Fractures)     Follow up plan: Return in about 6 months (around 06/17/2024) for T2DM, HTN/HLD, CKD + needs Medicare Wellness nurse visit please.

## 2023-12-20 ENCOUNTER — Other Ambulatory Visit: Payer: Self-pay | Admitting: Nurse Practitioner

## 2023-12-20 LAB — LIPID PANEL W/O CHOL/HDL RATIO
Cholesterol, Total: 108 mg/dL (ref 100–199)
HDL: 33 mg/dL — ABNORMAL LOW (ref >39)
LDL Chol Calc (NIH): 54 mg/dL (ref 0–99)
Triglycerides: 117 mg/dL (ref 0–149)
VLDL Cholesterol Cal: 21 mg/dL (ref 5–40)

## 2023-12-20 LAB — COMPREHENSIVE METABOLIC PANEL
ALT: 12 [IU]/L (ref 0–32)
AST: 10 [IU]/L (ref 0–40)
Albumin: 3.8 g/dL — ABNORMAL LOW (ref 3.9–4.9)
Alkaline Phosphatase: 116 [IU]/L (ref 44–121)
BUN/Creatinine Ratio: 17 (ref 9–23)
BUN: 37 mg/dL — ABNORMAL HIGH (ref 6–24)
Bilirubin Total: <0.2 mg/dL (ref 0.0–1.2)
CO2: 17 mmol/L — ABNORMAL LOW (ref 20–29)
Calcium: 9.2 mg/dL (ref 8.7–10.2)
Chloride: 108 mmol/L — ABNORMAL HIGH (ref 96–106)
Creatinine, Ser: 2.12 mg/dL — ABNORMAL HIGH (ref 0.57–1.00)
Globulin, Total: 2.7 g/dL (ref 1.5–4.5)
Glucose: 121 mg/dL — ABNORMAL HIGH (ref 70–99)
Potassium: 5.4 mmol/L — ABNORMAL HIGH (ref 3.5–5.2)
Sodium: 141 mmol/L (ref 134–144)
Total Protein: 6.5 g/dL (ref 6.0–8.5)
eGFR: 29 mL/min/{1.73_m2} — ABNORMAL LOW (ref >59)

## 2023-12-20 LAB — VITAMIN D 25 HYDROXY (VIT D DEFICIENCY, FRACTURES): Vit D, 25-Hydroxy: 10.6 ng/mL — ABNORMAL LOW (ref 30.0–100.0)

## 2023-12-20 LAB — MAGNESIUM: Magnesium: 2 mg/dL (ref 1.6–2.3)

## 2023-12-20 MED ORDER — CHOLECALCIFEROL 1.25 MG (50000 UT) PO TABS: 1.0000 | ORAL_TABLET | ORAL | 4 refills | Status: DC

## 2023-12-20 NOTE — Progress Notes (Signed)
 Contacted via MyChart   Good morning Adaysha, your labs have returned: - Kidney function, creatinine and eGFR, continues to show chronic kidney disease stage 4.  Continue to collaborate with kidney doctor. - Lipid panel shows levels at goal. Continue your medication for this as it is helping. - Potassium is a little elevated at 5.4, please ensure you are taking Lokelma daily and not missing doses so this level can stay stable.  We will continue to monitor this closely with nephrology and cardiology. - Vitamin D  level is quite low, I am sending in a weekly supplement for you to take to help improve this for your bone health. - Remainder of labs are stable.   Keep being amazing!!  Thank you for allowing me to participate in your care.  I appreciate you. Kindest regards, Khristina Janota

## 2023-12-24 ENCOUNTER — Telehealth: Payer: Self-pay | Admitting: Pharmacist

## 2023-12-24 NOTE — Telephone Encounter (Signed)
 Please update PA for Praluent 

## 2023-12-25 ENCOUNTER — Telehealth: Payer: Self-pay | Admitting: Pharmacy Technician

## 2023-12-25 ENCOUNTER — Encounter: Payer: Self-pay | Admitting: Internal Medicine

## 2023-12-25 ENCOUNTER — Other Ambulatory Visit (HOSPITAL_COMMUNITY): Payer: Self-pay

## 2023-12-25 MED ORDER — PRALUENT 150 MG/ML ~~LOC~~ SOAJ: 150.0000 mg | SUBCUTANEOUS | 3 refills | Status: AC

## 2023-12-25 NOTE — Telephone Encounter (Signed)
Pharmacy Patient Advocate Encounter   Received notification from Pt Calls Messages that prior authorization for praluent is required/requested.   Insurance verification completed.   The patient is insured through [facility] .   Per test claim: PA required; PA submitted to above mentioned insurance via CoverMyMeds Key/confirmation #/EOC [id number] Status is pending

## 2023-12-25 NOTE — Telephone Encounter (Signed)
Pharmacy Patient Advocate Encounter  Received notification from [facility] that Prior Authorization for praluent has been APPROVED from 12/25/23 to 12/08/2098   PA #/Case ID/Reference #: [id number]

## 2023-12-25 NOTE — Addendum Note (Signed)
Addended by: Cheree Ditto on: 12/25/2023 04:10 PM   Modules accepted: Orders

## 2023-12-31 ENCOUNTER — Other Ambulatory Visit: Payer: Self-pay | Admitting: Cardiovascular Disease

## 2023-12-31 NOTE — Telephone Encounter (Signed)
Good Morning,  Please advise if ok to refill medication once an appointment has been established? The patient was last seen by [name] [name] on 05-28-2023. He advised the patient to return in one month. There has been attempts to reach out to the patient to schedule an appointment, but those attempts were unsuccessful. Thank you so much.

## 2024-01-01 DIAGNOSIS — E11311 Type 2 diabetes mellitus with unspecified diabetic retinopathy with macular edema: Secondary | ICD-10-CM | POA: Diagnosis not present

## 2024-01-01 DIAGNOSIS — Z794 Long term (current) use of insulin: Secondary | ICD-10-CM | POA: Diagnosis not present

## 2024-01-06 ENCOUNTER — Encounter: Payer: Self-pay | Admitting: Internal Medicine

## 2024-01-08 ENCOUNTER — Encounter: Payer: Self-pay | Admitting: Internal Medicine

## 2024-01-08 DIAGNOSIS — E1129 Type 2 diabetes mellitus with other diabetic kidney complication: Secondary | ICD-10-CM | POA: Diagnosis not present

## 2024-01-08 DIAGNOSIS — N3000 Acute cystitis without hematuria: Secondary | ICD-10-CM | POA: Diagnosis not present

## 2024-01-08 DIAGNOSIS — I10 Essential (primary) hypertension: Secondary | ICD-10-CM | POA: Diagnosis not present

## 2024-01-08 DIAGNOSIS — N184 Chronic kidney disease, stage 4 (severe): Secondary | ICD-10-CM | POA: Diagnosis not present

## 2024-01-08 DIAGNOSIS — R809 Proteinuria, unspecified: Secondary | ICD-10-CM | POA: Diagnosis not present

## 2024-01-08 DIAGNOSIS — E1159 Type 2 diabetes mellitus with other circulatory complications: Secondary | ICD-10-CM | POA: Diagnosis not present

## 2024-01-08 DIAGNOSIS — E1122 Type 2 diabetes mellitus with diabetic chronic kidney disease: Secondary | ICD-10-CM | POA: Diagnosis not present

## 2024-01-08 DIAGNOSIS — Z794 Long term (current) use of insulin: Secondary | ICD-10-CM | POA: Diagnosis not present

## 2024-01-08 DIAGNOSIS — E11311 Type 2 diabetes mellitus with unspecified diabetic retinopathy with macular edema: Secondary | ICD-10-CM | POA: Diagnosis not present

## 2024-01-08 DIAGNOSIS — E1143 Type 2 diabetes mellitus with diabetic autonomic (poly)neuropathy: Secondary | ICD-10-CM | POA: Diagnosis not present

## 2024-01-09 ENCOUNTER — Encounter: Payer: Self-pay | Admitting: Internal Medicine

## 2024-01-12 ENCOUNTER — Encounter: Payer: Self-pay | Admitting: Internal Medicine

## 2024-01-13 ENCOUNTER — Ambulatory Visit: Payer: Medicare (Managed Care)

## 2024-01-21 ENCOUNTER — Other Ambulatory Visit: Payer: Self-pay | Admitting: Nurse Practitioner

## 2024-01-21 NOTE — Telephone Encounter (Signed)
Requested Prescriptions  Pending Prescriptions Disp Refills   pantoprazole (PROTONIX) 40 MG tablet [Pharmacy Med Name: Pantoprazole Sodium 40 MG Oral Tablet Delayed Release] 180 tablet 0    Sig: Take 1 tablet by mouth twice daily     Gastroenterology: Proton Pump Inhibitors Passed - 01/21/2024  3:47 PM      Passed - Valid encounter within last 12 months    Recent Outpatient Visits           1 month ago Diabetic gastroparesis associated with type 2 diabetes mellitus (HCC)   [facility] [place], [place] T, NP   1 month ago CKD stage 4 due to type 2 diabetes mellitus (HCC)   [facility] [place], [place] T, NP   2 months ago Herpes zoster without complication   [place] [facility] [place], Sherran Needs, NP   3 months ago Herpes zoster without complication   [facility] [place], [place] T, NP   7 months ago CKD stage 4 due to type 2 diabetes mellitus [facility])   [place] Marjie Skiff, NP       Future Appointments             In 2 weeks Brion Aliment, Dois Davenport, NP [facility] [place]   In 4 months [place], Dorie Rank, NP [place]

## 2024-01-30 ENCOUNTER — Encounter: Payer: Self-pay | Admitting: Nurse Practitioner

## 2024-01-30 MED ORDER — BENZONATATE 100 MG PO CAPS: 100.0000 mg | ORAL_CAPSULE | Freq: Two times a day (BID) | ORAL | 3 refills | Status: DC | PRN

## 2024-02-05 ENCOUNTER — Inpatient Hospital Stay: Payer: Medicare Other | Admitting: Internal Medicine

## 2024-02-05 ENCOUNTER — Other Ambulatory Visit: Payer: Medicare (Managed Care)

## 2024-02-05 ENCOUNTER — Inpatient Hospital Stay: Payer: Medicare Other

## 2024-02-05 ENCOUNTER — Inpatient Hospital Stay: Payer: Medicare Other | Attending: Nurse Practitioner

## 2024-02-05 ENCOUNTER — Ambulatory Visit: Payer: Medicare (Managed Care) | Admitting: Internal Medicine

## 2024-02-05 ENCOUNTER — Ambulatory Visit: Payer: Medicare (Managed Care)

## 2024-02-06 ENCOUNTER — Ambulatory Visit: Payer: Medicare Other | Admitting: Nurse Practitioner

## 2024-02-06 ENCOUNTER — Ambulatory Visit: Payer: Medicare (Managed Care)

## 2024-02-06 ENCOUNTER — Other Ambulatory Visit: Payer: Medicare (Managed Care)

## 2024-02-06 ENCOUNTER — Ambulatory Visit: Payer: Medicare (Managed Care) | Admitting: Internal Medicine

## 2024-02-06 NOTE — Progress Notes (Deleted)
 Office Visit    Patient Name: [name] [name] Date of Encounter: 02/06/2024  Primary Care Provider:  Marjie Skiff, NP Primary Cardiologist:  Lorine Bears, MD  Chief Complaint    43 y.o. 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 03/2015 NSTEMI/PCI: LCX 182m (DES), OM1 100p (DES - vessel labeled Ramus in subsequent cath report); b. 04/2015 Cath: patent stents/stable anatomy; c. 02/2016 MV: No isch; d. 11/2018 PCI: LCX 95p/m (PTCA->20%); e. 01/2019 PCI: RCA 85p/m (2.5x32 Synergy DES); f. 01/2021 Cath: LM 30, LAD 40ost/p, 8m/d, LCX 55p restenosis, OM1 10, OM2 40, RCA patent stent-->Med rx.   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 03/2015 Echo: EF 30-35%, mild concentric LVH, severe HK of inf, inflat, & lat walls, mod MR, mild TR;  b. 04/2015 LV Gram: EF 55-65%; c. 09/2017 Echo: EF 55-60%, no rwma. Mild MR; d. 11/2018 Echo: Ef 60-65%, no rwma; e. 06/2022 Echo: EF 60-65%, nl RV fxn, mildly dil LA, mild MR.   CKD (chronic kidney disease), stage IV (HCC)    Diabetes type 2, controlled (HCC)    a. since 2002    Diabetic gastroparesis (HCC)    a. chronic nausea/vomiting.   Diabetic retinopathy (HCC)    Diverticulosis, sigmoid 07/2016   Fatty liver    GERD (gastroesophageal reflux disease)    Heavy menses    a. H/O IUD - expired in 2014 - remains in place.   Hx of migraines    Hyperkalemia    a. On chronic lokelma - managed by nephrology.   Hyperlipidemia    Intolerant of atorvastatin, rosuvastatin    Hypertension    Iron deficiency anemia    Ischemic cardiomyopathy (resolved)    a. 03/2015 EF 30-35% post NSTEMI;  b. 04/2015 EF 55-65% on LV gram; c. 09/2017 Echo: EF 55-60%; d. 11/2018 Echo: Ef 60-65%; e. 06/2022 Echo: EF 60-65%.   Obesity    Tobacco abuse    a. quit 03/2015.   Uterine fibroid    Vertigo    Vitamin D deficiency    Past Surgical History:  Procedure Laterality Date   APPENDECTOMY     CARDIAC CATHETERIZATION  03/2015   x2 stent [facility]   CARDIAC CATHETERIZATION N/A 05/05/2015   Procedure: Left Heart Cath and Coronary Angiography;  Surgeon: Peter M Swaziland, MD;  Location: [facility] INVASIVE CV LAB;  Service: Cardiovascular;  Laterality: N/A;   CATARACT EXTRACTION Left 09/12/2021   CATARACT EXTRACTION Right 10/10/2021   COLONOSCOPY WITH PROPOFOL N/A 07/31/2016   Procedure: COLONOSCOPY WITH PROPOFOL;  Surgeon: Christena Deem, MD;  Location: [facility] ENDOSCOPY;  Service: Endoscopy;  Laterality: N/A;   CORONARY BALLOON ANGIOPLASTY N/A 12/04/2018   Procedure: CORONARY BALLOON ANGIOPLASTY;  Surgeon: Iran Ouch, MD;  Location: [facility] INVASIVE CV LAB;  Service: Cardiovascular;  Laterality: N/A;   CORONARY PRESSURE/FFR STUDY N/A 12/04/2018   Procedure: INTRAVASCULAR PRESSURE WIRE/FFR STUDY;  Surgeon: Iran Ouch, MD;  Location: [facility] INVASIVE CV LAB;  Service: Cardiovascular;  Laterality: N/A;   CORONARY STENT INTERVENTION N/A 01/12/2019  Procedure: CORONARY STENT INTERVENTION;  Surgeon: Yvonne Kendall, MD;  Location: [facility] INVASIVE CV LAB;  Service: Cardiovascular;  Laterality: N/A;   ESOPHAGOGASTRODUODENOSCOPY (EGD) WITH PROPOFOL N/A 07/31/2016   Procedure: ESOPHAGOGASTRODUODENOSCOPY (EGD) WITH PROPOFOL;  Surgeon: Christena Deem, MD;  Location: [facility] ENDOSCOPY;  Service: Endoscopy;  Laterality: N/A;   EYE SURGERY  07/16/2022   Membrane peeled   LAPAROSCOPIC APPENDECTOMY N/A 08/07/2016   Procedure: APPENDECTOMY LAPAROSCOPIC;  Surgeon: Gladis Riffle, MD;  Location: [facility] ORS;   Service: General;  Laterality: N/A;   LEFT HEART CATH AND CORONARY ANGIOGRAPHY Left 12/04/2018   Procedure: LEFT HEART CATH AND CORONARY ANGIOGRAPHY;  Surgeon: Iran Ouch, MD;  Location: [facility] INVASIVE CV LAB;  Service: Cardiovascular;  Laterality: Left;   LEFT HEART CATH AND CORONARY ANGIOGRAPHY N/A 01/12/2019   Procedure: LEFT HEART CATH AND CORONARY ANGIOGRAPHY;  Surgeon: Yvonne Kendall, MD;  Location: [facility] INVASIVE CV LAB;  Service: Cardiovascular;  Laterality: N/A;   MOUTH SURGERY     RIGHT/LEFT HEART CATH AND CORONARY ANGIOGRAPHY N/A 01/29/2021   Procedure: RIGHT/LEFT HEART CATH AND CORONARY ANGIOGRAPHY;  Surgeon: Iran Ouch, MD;  Location: [facility] INVASIVE CV LAB;  Service: Cardiovascular;  Laterality: N/A;   VITRECTOMY Left 10/30/2021    Allergies  Allergies  Allergen Reactions   Lisinopril Anaphylaxis, Itching and Swelling   Other Itching and Swelling    [place] seasoning    Rosemary Oil Anaphylaxis   Shellfish Allergy Itching and Swelling    Pt states that she HAS had IV Contrast without any reaction   Statins Other (See Comments)    Myalgias   Metformin And Related Diarrhea, Nausea And Vomiting and Rash      History of Present Illness      43 y.o. 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 Cardiac history dates back to April 2016, when she was hospitalized with non-STEMI. She was found to have LV dysfunction with an EF of 30 to 35%. Diagnostic catheterization showed severe left circumflex and OM1 disease. Both areas were successfully treated with drug-eluting stents. Subsequent catheterization in May 2016, showed patency of both stents and normalization of LV function by ventriculography. She had nonischemic  stress testing in March 2017, but in the setting of recurrent angina in December 2019, she underwent diagnostic catheterization, which showed patent OM1 stent with focal in-stent restenosis in the left circumflex at the origin of the OM 2, and new significant stenosis in the proximal to mid right coronary artery. Balloon angioplasty was performed in the mid left circumflex and she subsequently underwent staged PCI of the RCA in early 2020.  In February 2022, she underwent right and left heart cardiac catheterization in the setting of chest pain and dyspnea. This showed patent stents in the RCA, left circumflex, and OM1. There was moderate restenosis in the mid left circumflex at the site of prior PTCA. She was medically managed.  Echocardiogram in July showed an EF of 60 to 65% with normal RV function, mildly dilated left atrium, and mild mitral regurgitation.     Ms. Berko was last in cardiology clinic in June 2024 at which time she reported occasional sharp and fleeting chest pain lasting 1 to 2 seconds.  She also reported intermittent lightheadedness.  She was mildly orthostatic on examination.  Subsequent lab work through primary care  showed elevation in BUN/creatinine to 44 and 2.48.  H&H is also down to 9.9 and 32.9.  She has since f/u w/ hematology, nephrology, and endo.  She was also ordered a 7-day ZIO monitor at last cardiology visit which she did not complete. Objective  Home Medications    Current Outpatient Medications  Medication Sig Dispense Refill   acetaminophen (TYLENOL) 500 MG tablet Take 1,000 mg by mouth every 4 (four) hours as needed.     Alirocumab (PRALUENT) 150 MG/ML SOAJ Inject 1 mL (150 mg total) into the skin every 14 (fourteen) days. 6 mL 3   allopurinol (ZYLOPRIM) 100 MG tablet Take 1/2 (one-half) tablet by mouth once daily 45 tablet 4   aspirin EC 81 MG tablet Take 81 mg by mouth daily.     azelastine (ASTELIN) 0.1 % nasal spray Place 1 spray into both nostrils 2 (two)  times daily as needed. Use in each nostril as directed 30 mL 5   BD PEN NEEDLE MICRO U/F 32G X 6 MM MISC Inject into the skin.     benzonatate (TESSALON) 100 MG capsule Take 1 capsule (100 mg total) by mouth 2 (two) times daily as needed for cough. 60 capsule 3   carvedilol (COREG) 12.5 MG tablet TAKE 1 TABLET BY MOUTH TWICE DAILY WITH A MEAL 180 tablet 3   cetirizine (ZYRTEC ALLERGY) 10 MG tablet Take 1 tablet (10 mg total) by mouth daily. 30 tablet 5   Cholecalciferol 1.25 MG (50000 UT) TABS Take 1 tablet by mouth once a week. 12 tablet 4   clopidogrel (PLAVIX) 75 MG tablet Take 1 tablet by mouth once daily 30 tablet 0   Continuous Blood Gluc Receiver (DEXCOM G6 RECEIVER) DEVI      Continuous Blood Gluc Sensor (DEXCOM G6 SENSOR) MISC See admin instructions.     Continuous Blood Gluc Transmit (DEXCOM G6 TRANSMITTER) MISC      EPINEPHrine 0.3 mg/0.3 mL IJ SOAJ injection Inject 0.3 mg into the muscle as needed for anaphylaxis. 2 each 1   etonogestrel (NEXPLANON) 68 MG IMPL implant 1 each (68 mg total) by Subdermal route once for 1 dose. 1 each 0   ezetimibe (ZETIA) 10 MG tablet Take 1 tablet by mouth once daily 90 tablet 3   famotidine (PEPCID) 40 MG tablet Take 1 tablet (40 mg total) by mouth daily. 90 tablet 4   furosemide (LASIX) 20 MG tablet Take 20 mg by mouth daily.     insulin aspart (NOVOLOG) 100 UNIT/ML injection Inject 10 Units into the skin 3 (three) times daily with meals. 10 mL 11   Insulin Glargine (BASAGLAR KWIKPEN) 100 UNIT/ML Inject 110 Units into the skin at bedtime.     irbesartan (AVAPRO) 75 MG tablet Take 75 mg by mouth daily.     isosorbide mononitrate (IMDUR) 120 MG 24 hr tablet Take 1 tablet (120 mg total) by mouth daily. 90 tablet 3   LOKELMA 10 g PACK packet Take 10 g by mouth daily.     nitroGLYCERIN (NITROSTAT) 0.4 MG SL tablet Take 1 tab under your tongue while sitting for chest pain, If no relief may repeat, one tab every 5 min up to 3 tabs total over 15 mins. 25  tablet 3   Olopatadine HCl 0.2 % SOLN Apply 1 drop to eye daily as needed (itchy watery eyes). 2.5 mL 5   pantoprazole (PROTONIX) 40 MG tablet Take 1 tablet by mouth twice daily 180 tablet 0   Semaglutide,0.25 or 0.5MG /DOS, (  OZEMPIC, 0.25 OR 0.5 MG/DOSE,) 2 MG/1.5ML SOPN Inject 0.5 mg into the skin once a week.     XHANCE 93 MCG/ACT EXHU Place 1 spray into both nostrils 2 (two) times daily.     No current facility-administered medications for this visit.   Facility-Administered Medications Ordered in Other Visits  Medication Dose Route Frequency Provider Last Rate Last Admin   sodium chloride flush (NS) 0.9 % injection 3 mL  3 mL Intravenous Q12H Visser, Jacquelyn D, PA-C         Physical Exam    VS:  There were no vitals taken for this visit. , BMI There is no height or weight on file to calculate BMI.       GEN: Well nourished, well developed, in no acute distress. HEENT: normal. Neck: Supple, no JVD, carotid bruits, or masses. Cardiac: RRR, no murmurs, rubs, or gallops. No clubbing, cyanosis, edema.  Radials 2+/PT 2+ and equal bilaterally.  Respiratory:  Respirations regular and unlabored, clear to auscultation bilaterally. GI: Soft, nontender, nondistended, BS + x 4. MS: no deformity or atrophy. Skin: warm and dry, no rash. Neuro:  Strength and sensation are intact. Psych: Normal affect.  Accessory Clinical Findings    ECG personally reviewed by me today -    *** - no acute changes.  Lab Results  Component Value Date   WBC 6.4 09/05/2023   HGB 10.6 (L) 09/05/2023   HCT 34.5 (L) 09/05/2023   MCV 85.0 09/05/2023   PLT 404 (H) 09/05/2023   Lab Results  Component Value Date   CREATININE 2.12 (H) 12/19/2023   BUN 37 (H) 12/19/2023   NA 141 12/19/2023   K 5.4 (H) 12/19/2023   CL 108 (H) 12/19/2023   CO2 17 (L) 12/19/2023   Lab Results  Component Value Date   ALT 12 12/19/2023   AST 10 12/19/2023   GGT 487 (H) 06/03/2016   ALKPHOS 116 12/19/2023   BILITOT <0.2  12/19/2023   Lab Results  Component Value Date   CHOL 108 12/19/2023   HDL 33 (L) 12/19/2023   LDLCALC 54 12/19/2023   LDLDIRECT 163 (H) 12/17/2018   TRIG 117 12/19/2023   CHOLHDL 4.2 10/22/2021    Lab Results  Component Value Date   HGBA1C 7.6 (H) 05/29/2023   Lab Results  Component Value Date   TSH 1.550 05/29/2023   _____________  Labs dated January 01, 2024 from [facility]:  Sodium 141, potassium 5.6, chloride 107, CO2 22.1, BUN 57, creatinine 2.6, glucose 89 Hemoglobin A1c 7.9     Assessment & Plan    1.  ***  Nicolasa Ducking, NP 02/06/2024, 7:37 AM

## 2024-02-11 DIAGNOSIS — I10 Essential (primary) hypertension: Secondary | ICD-10-CM | POA: Diagnosis not present

## 2024-02-11 DIAGNOSIS — N184 Chronic kidney disease, stage 4 (severe): Secondary | ICD-10-CM | POA: Diagnosis not present

## 2024-02-11 DIAGNOSIS — E1122 Type 2 diabetes mellitus with diabetic chronic kidney disease: Secondary | ICD-10-CM | POA: Diagnosis not present

## 2024-02-11 DIAGNOSIS — R801 Persistent proteinuria, unspecified: Secondary | ICD-10-CM | POA: Diagnosis not present

## 2024-02-11 DIAGNOSIS — N2581 Secondary hyperparathyroidism of renal origin: Secondary | ICD-10-CM | POA: Diagnosis not present

## 2024-02-11 DIAGNOSIS — E875 Hyperkalemia: Secondary | ICD-10-CM | POA: Diagnosis not present

## 2024-02-19 ENCOUNTER — Other Ambulatory Visit: Payer: Self-pay | Admitting: Cardiovascular Disease

## 2024-03-04 ENCOUNTER — Encounter: Payer: Self-pay | Admitting: Nurse Practitioner

## 2024-03-04 ENCOUNTER — Ambulatory Visit: Attending: Nurse Practitioner | Admitting: Nurse Practitioner

## 2024-03-04 VITALS — BP 142/82 | HR 90 | Ht 65.0 in | Wt 264.4 lb

## 2024-03-04 DIAGNOSIS — E875 Hyperkalemia: Secondary | ICD-10-CM | POA: Diagnosis not present

## 2024-03-04 DIAGNOSIS — N183 Chronic kidney disease, stage 3 unspecified: Secondary | ICD-10-CM

## 2024-03-04 DIAGNOSIS — I255 Ischemic cardiomyopathy: Secondary | ICD-10-CM | POA: Diagnosis not present

## 2024-03-04 DIAGNOSIS — I2510 Atherosclerotic heart disease of native coronary artery without angina pectoris: Secondary | ICD-10-CM | POA: Diagnosis not present

## 2024-03-04 DIAGNOSIS — E785 Hyperlipidemia, unspecified: Secondary | ICD-10-CM | POA: Diagnosis not present

## 2024-03-04 DIAGNOSIS — E1165 Type 2 diabetes mellitus with hyperglycemia: Secondary | ICD-10-CM

## 2024-03-04 DIAGNOSIS — Z794 Long term (current) use of insulin: Secondary | ICD-10-CM | POA: Diagnosis not present

## 2024-03-04 DIAGNOSIS — I5032 Chronic diastolic (congestive) heart failure: Secondary | ICD-10-CM | POA: Diagnosis not present

## 2024-03-04 DIAGNOSIS — I130 Hypertensive heart and chronic kidney disease with heart failure and stage 1 through stage 4 chronic kidney disease, or unspecified chronic kidney disease: Secondary | ICD-10-CM

## 2024-03-04 MED ORDER — CARVEDILOL 12.5 MG PO TABS: ORAL_TABLET | ORAL | 5 refills | Status: DC

## 2024-03-04 NOTE — Progress Notes (Signed)
 Office Visit    Patient Name: [name] [name] Date of Encounter: 03/04/2024  Primary Care Provider:  Marjie Skiff, NP Primary Cardiologist:  Lorine Bears, MD  Chief Complaint    43 y.o. 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  Past Medical History  Subjective   Past Medical History:  Diagnosis Date   CAD (coronary artery disease)    a. 03/2015 NSTEMI/PCI: LCX 171m (DES), OM1 100p (DES - vessel labeled Ramus in subsequent cath report); b. 04/2015 Cath: patent stents/stable anatomy; c. 02/2016 MV: No isch; d. 11/2018 PCI: LCX 95p/m (PTCA->20%); e. 01/2019 PCI: RCA 85p/m (2.5x32 Synergy DES); f. 01/2021 Cath: LM 30, LAD 40ost/p, 44m/d, LCX 55p restenosis, OM1 10, OM2 40, RCA patent stent-->Med rx.   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 03/2015 Echo: EF 30-35%, mild concentric LVH, severe HK of inf, inflat, & lat walls, mod MR, mild TR;  b. 04/2015 LV Gram: EF 55-65%; c. 09/2017 Echo: EF 55-60%, no rwma. Mild MR; d. 11/2018 Echo: Ef 60-65%, no rwma; e. 06/2022 Echo: EF 60-65%, nl RV fxn, mildly dil LA, mild MR.   CKD (chronic kidney disease), stage IV (HCC)    Diabetes type 2, controlled (HCC)    a. since 2002    Diabetic gastroparesis (HCC)    a. chronic nausea/vomiting.   Diabetic retinopathy (HCC)    Diverticulosis, sigmoid 07/2016   Fatty liver    GERD (gastroesophageal reflux disease)    Heavy menses    a. H/O IUD - expired in 2014 - remains in place.   Hx of migraines    Hyperkalemia    a. On chronic lokelma - managed by nephrology.   Hyperlipidemia    Intolerant of atorvastatin,  rosuvastatin   Hypertension    Iron deficiency anemia    Ischemic cardiomyopathy (resolved)    a. 03/2015 EF 30-35% post NSTEMI;  b. 04/2015 EF 55-65% on LV gram; c. 09/2017 Echo: EF 55-60%; d. 11/2018 Echo: Ef 60-65%; e. 06/2022 Echo: EF 60-65%.   Obesity    Tobacco abuse    a. quit 03/2015.   Uterine fibroid    Vertigo    Vitamin D deficiency    Past Surgical History:  Procedure Laterality Date   APPENDECTOMY     CARDIAC CATHETERIZATION  03/2015   x2 stent [facility] ([facility]Rh)   CARDIAC CATHETERIZATION N/A 05/05/2015   Procedure: Left Heart Cath and Coronary Angiography;  Surgeon: Peter M Swaziland, MD;  Location: [facility] INVASIVE CV LAB;  Service: Cardiovascular;  Laterality: N/A;   CATARACT EXTRACTION Left 09/12/2021   CATARACT EXTRACTION Right 10/10/2021   COLONOSCOPY WITH PROPOFOL N/A 07/31/2016   Procedure: COLONOSCOPY WITH PROPOFOL;  Surgeon: Christena Deem, MD;  Location: [facility] ENDOSCOPY;  Service: Endoscopy;  Laterality: N/A;   CORONARY BALLOON ANGIOPLASTY N/A 12/04/2018   Procedure: CORONARY BALLOON ANGIOPLASTY;  Surgeon: Iran Ouch, MD;  Location: [facility] INVASIVE CV LAB;  Service: Cardiovascular;  Laterality: N/A;   CORONARY PRESSURE/FFR STUDY N/A 12/04/2018   Procedure: INTRAVASCULAR PRESSURE WIRE/FFR STUDY;  Surgeon: Iran Ouch, MD;  Location: [facility] INVASIVE CV LAB;  Service: Cardiovascular;  Laterality: N/A;   CORONARY STENT INTERVENTION N/A 01/12/2019  Procedure: CORONARY STENT INTERVENTION;  Surgeon: Yvonne Kendall, MD;  Location: [facility] INVASIVE CV LAB;  Service: Cardiovascular;  Laterality: N/A;   ESOPHAGOGASTRODUODENOSCOPY (EGD) WITH PROPOFOL N/A 07/31/2016   Procedure: ESOPHAGOGASTRODUODENOSCOPY (EGD) WITH PROPOFOL;  Surgeon: Christena Deem, MD;  Location: [facility] ENDOSCOPY;  Service: Endoscopy;  Laterality: N/A;   EYE SURGERY  07/16/2022   Membrane peeled   LAPAROSCOPIC APPENDECTOMY N/A 08/07/2016   Procedure: APPENDECTOMY LAPAROSCOPIC;  Surgeon: Gladis Riffle, MD;   Location: [facility] ORS;  Service: General;  Laterality: N/A;   LEFT HEART CATH AND CORONARY ANGIOGRAPHY Left 12/04/2018   Procedure: LEFT HEART CATH AND CORONARY ANGIOGRAPHY;  Surgeon: Iran Ouch, MD;  Location: [facility] INVASIVE CV LAB;  Service: Cardiovascular;  Laterality: Left;   LEFT HEART CATH AND CORONARY ANGIOGRAPHY N/A 01/12/2019   Procedure: LEFT HEART CATH AND CORONARY ANGIOGRAPHY;  Surgeon: Yvonne Kendall, MD;  Location: [facility] INVASIVE CV LAB;  Service: Cardiovascular;  Laterality: N/A;   MOUTH SURGERY     RIGHT/LEFT HEART CATH AND CORONARY ANGIOGRAPHY N/A 01/29/2021   Procedure: RIGHT/LEFT HEART CATH AND CORONARY ANGIOGRAPHY;  Surgeon: Iran Ouch, MD;  Location: [facility] INVASIVE CV LAB;  Service: Cardiovascular;  Laterality: N/A;   VITRECTOMY Left 10/30/2021    Allergies  Allergies  Allergen Reactions   Lisinopril Anaphylaxis, Itching and Swelling   Other Itching and Swelling    [place] seasoning    Rosemary Oil Anaphylaxis   Shellfish Allergy Itching and Swelling    Pt states that she HAS had IV Contrast without any reaction   Statins Other (See Comments)    Myalgias   Metformin And Related Diarrhea, Nausea And Vomiting and Rash      History of Present Illness      43 y.o. 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 Cardiac history dates back to April 2016, when she was hospitalized with non-STEMI. She was found to have LV dysfunction with an EF of 30 to 35%. Diagnostic catheterization showed severe left circumflex and OM1 disease. Both areas were successfully treated with drug-eluting stents. Subsequent catheterization in May 2016, showed patency of both stents and normalization of LV function by ventriculography.  She had nonischemic stress testing in March 2017, but in the setting of recurrent angina in December 2019, she underwent diagnostic catheterization, which showed patent OM1 stent with focal in-stent restenosis in the left circumflex at the origin of the OM 2, and new significant stenosis in the proximal to mid right coronary artery. Balloon angioplasty was performed in the mid left circumflex and she subsequently underwent staged PCI of the RCA in early 2020.  In February 2022, she underwent right and left heart cardiac catheterization in the setting of chest pain and dyspnea. This showed patent stents in the RCA, left circumflex, and OM1. There was moderate restenosis in the mid left circumflex at the site of prior PTCA. She was medically managed.  Echocardiogram in July showed an EF of 60 to 65% with normal RV function, mildly dilated left atrium, and mild mitral regurgitation.      Ms. Perella was last seen in cardiology clinic in June 2024, at which time she noted occasional fleeting chest pain, different from prior angina.  We mutually agreed to forego ischemic evaluation at that time and to follow clinically.  She was also mildly orthostatic  on examination which in the setting of resting hypertension, was managed conservatively as well.  Since her last visit she has done reasonably well.  She has not had any sharp shooting chest pain in several months.  She has been active, walking regularly without chest pain or dyspnea.  She is on ozempic and has lost 10 lbs. she continues to experience mild orthostatic symptoms and notes that when she takes her carvedilol 12.5 mg twice a day, the following morning, her pressures are often in the 80s and 90s with associated lightheadedness.  In that setting, she frequently skips her evening dose of carvedilol.  Today, her blood pressure is elevated after skipping her p.m. dose of carvedilol last night.  She denies palpitations, PND, orthopnea, syncope, edema, or early  satiety. Objective  Home Medications    Current Outpatient Medications  Medication Sig Dispense Refill   acetaminophen (TYLENOL) 500 MG tablet Take 1,000 mg by mouth every 4 (four) hours as needed.     Alirocumab (PRALUENT) 150 MG/ML SOAJ Inject 1 mL (150 mg total) into the skin every 14 (fourteen) days. 6 mL 3   allopurinol (ZYLOPRIM) 100 MG tablet Take 1/2 (one-half) tablet by mouth once daily 45 tablet 4   aspirin EC 81 MG tablet Take 81 mg by mouth daily.     azelastine (ASTELIN) 0.1 % nasal spray Place 1 spray into both nostrils 2 (two) times daily as needed. Use in each nostril as directed 30 mL 5   BD PEN NEEDLE MICRO U/F 32G X 6 MM MISC Inject into the skin.     benzonatate (TESSALON) 100 MG capsule Take 1 capsule (100 mg total) by mouth 2 (two) times daily as needed for cough. 60 capsule 3   carvedilol (COREG) 12.5 MG tablet TAKE 1 TABLET BY MOUTH TWICE DAILY WITH A MEAL 180 tablet 3   cetirizine (ZYRTEC ALLERGY) 10 MG tablet Take 1 tablet (10 mg total) by mouth daily. 30 tablet 5   Cholecalciferol 1.25 MG (50000 UT) TABS Take 1 tablet by mouth once a week. 12 tablet 4   clopidogrel (PLAVIX) 75 MG tablet Take 1 tablet (75 mg total) by mouth daily. 90 tablet 0   Continuous Blood Gluc Receiver (DEXCOM G6 RECEIVER) DEVI      Continuous Blood Gluc Sensor (DEXCOM G6 SENSOR) MISC See admin instructions.     Continuous Blood Gluc Transmit (DEXCOM G6 TRANSMITTER) MISC      EPINEPHrine 0.3 mg/0.3 mL IJ SOAJ injection Inject 0.3 mg into the muscle as needed for anaphylaxis. 2 each 1   etonogestrel (NEXPLANON) 68 MG IMPL implant 1 each (68 mg total) by Subdermal route once for 1 dose. 1 each 0   ezetimibe (ZETIA) 10 MG tablet Take 1 tablet by mouth once daily 90 tablet 3   famotidine (PEPCID) 40 MG tablet Take 1 tablet (40 mg total) by mouth daily. 90 tablet 4   furosemide (LASIX) 20 MG tablet Take 20 mg by mouth daily.     insulin aspart (NOVOLOG) 100 UNIT/ML injection Inject 10 Units into  the skin 3 (three) times daily with meals. 10 mL 11   Insulin Glargine (BASAGLAR KWIKPEN) 100 UNIT/ML Inject 110 Units into the skin at bedtime.     insulin glargine-yfgn (SEMGLEE) 100 UNIT/ML Pen Inject into the skin.     irbesartan (AVAPRO) 75 MG tablet Take 75 mg by mouth daily.     isosorbide mononitrate (IMDUR) 120 MG 24 hr tablet Take 1 tablet (120 mg total)  by mouth daily. 90 tablet 3   LOKELMA 10 g PACK packet Take 10 g by mouth daily.     nitroGLYCERIN (NITROSTAT) 0.4 MG SL tablet Take 1 tab under your tongue while sitting for chest pain, If no relief may repeat, one tab every 5 min up to 3 tabs total over 15 mins. 25 tablet 3   Olopatadine HCl 0.2 % SOLN Apply 1 drop to eye daily as needed (itchy watery eyes). 2.5 mL 5   pantoprazole (PROTONIX) 40 MG tablet Take 1 tablet by mouth twice daily 180 tablet 0   promethazine (PHENERGAN) 25 MG tablet Take by mouth.     Semaglutide,0.25 or 0.5MG /DOS, (OZEMPIC, 0.25 OR 0.5 MG/DOSE,) 2 MG/1.5ML SOPN Inject 0.5 mg into the skin once a week.     XHANCE 93 MCG/ACT EXHU Place 1 spray into both nostrils 2 (two) times daily.     No current facility-administered medications for this visit.   Facility-Administered Medications Ordered in Other Visits  Medication Dose Route Frequency Provider Last Rate Last Admin   sodium chloride flush (NS) 0.9 % injection 3 mL  3 mL Intravenous Q12H Visser, Jacquelyn D, PA-C         Physical Exam    VS:  BP (!) 140/90   Pulse 90   Ht 5\' 5"  (1.651 m)   Wt 264 lb 6.4 oz (119.9 kg)   SpO2 97%   BMI 44.00 kg/m  , BMI Body mass index is 44 kg/m.     Vitals:   03/04/24 1013 03/04/24 1121  BP: (!) 140/90 (!) 142/82  Pulse: 90   SpO2: 97%       GEN: Well nourished, well developed, in no acute distress. HEENT: normal. Neck: Supple, obese, difficult to gauge JVP.  No bruits.   Cardiac: RRR, no murmurs, rubs, or gallops. No clubbing, cyanosis, edema.  Radials 2+/PT 2+ and equal bilaterally.  Respiratory:   Respirations regular and unlabored, clear to auscultation bilaterally. GI: Soft, nontender, nondistended, BS + x 4. MS: no deformity or atrophy. Skin: warm and dry, no rash. Neuro:  Strength and sensation are intact. Psych: Normal affect.  Accessory Clinical Findings    ECG personally reviewed by me today - EKG Interpretation Date/Time:  Thursday March 04 2024 10:16:45 EDT Ventricular Rate:  90 PR Interval:  144 QRS Duration:  78 QT Interval:  362 QTC Calculation: 442 R Axis:   -33  Text Interpretation: Normal sinus rhythm Left axis deviation Low voltage QRS Confirmed by Nicolasa Ducking [id number]) on 03/04/2024 10:25:37 AM  - no acute changes.  Lab Results  Component Value Date   WBC 6.4 09/05/2023   HGB 10.6 (L) 09/05/2023   HCT 34.5 (L) 09/05/2023   MCV 85.0 09/05/2023   PLT 404 (H) 09/05/2023   Lab Results  Component Value Date   CREATININE 2.12 (H) 12/19/2023   BUN 37 (H) 12/19/2023   NA 141 12/19/2023   K 5.4 (H) 12/19/2023   CL 108 (H) 12/19/2023   CO2 17 (L) 12/19/2023   Lab Results  Component Value Date   ALT 12 12/19/2023   AST 10 12/19/2023   GGT 487 (H) 06/03/2016   ALKPHOS 116 12/19/2023   BILITOT <0.2 12/19/2023   Lab Results  Component Value Date   CHOL 108 12/19/2023   HDL 33 (L) 12/19/2023   LDLCALC 54 12/19/2023   LDLDIRECT 163 (H) 12/17/2018   TRIG 117 12/19/2023   CHOLHDL 4.2 10/22/2021    Lab Results  Component Value Date   TSH 1.550 05/29/2023    Labs dated February 11, 2024 Care Everywhere:  Hemoglobin 10.4, hematocrit 33.4, WBC 6.9, platelets 376 Sodium 135, potassium 5.3, chloride 102, CO2 23, BUN 38, creatinine 2.65, glucose 198  Labs dated January 01, 2024 from Care Everywhere:  Hemoglobin A1c 7.9    Assessment & Plan    1.  Coronary artery disease: Status post prior stenting of the left circumflex and OM1 in April 2016, with PTCA of left circumflex in December 2019, and drug-eluting stent placement to the RCA in February  2020.  Most recent cath in February 2022 showed 55% proximal restenosis in the left circumflex (PTCA site) but otherwise stable anatomy.  She has been doing well over the past 9 months without chest pain or dyspnea.  She is actively trying to lose weight and is down 10 pounds and tolerating Ozempic.  She remains on aspirin, beta-blocker, Plavix, nitrate, ARB, Zetia, and Praluent.  2.  Primary hypertension/orthostasis: Patient continues to have periodic orthostasis and soft blood pressures, especially when she takes her carvedilol as prescribed-12.5 mg twice daily.  This often results in low pressures the following day causing her to skip her p.m. dose on a regular basis.  Pressures today are elevated in the 140/90 range.  I am going to change her carvedilol to 12.5 mg in the morning and 6.25 mg in the evening to see if we can avoid periodic hypotension and reach some homeostasis of her BP.  She otherwise remains on irbesartan and long-acting nitrate therapy.  3.  Chronic heart failure with improved ejection fraction/ischemic cardiomyopathy: EF previously as low as 30 to 35% in April 2016 with subsequent improvement following PCI and most recent echo in July 2023 showed an EF of 60-65%.  She is euvolemic on examination and remains on beta-blocker, ARB, and low-dose diuretic therapy.  She is now back on Ozempic after previously not tolerating well, and seems to be tolerating at this time.    4.  Stage III-IV chronic kidney disease: Followed by nephrology with creatinine of 2.65 earlier this month.  She is on ARB therapy and also requires Lokelma in the setting of chronic hyperkalemia.  5.  Hyperkalemia: Followed closely by nephrology on [facility].  6.  Type 2 diabetes mellitus: A1c 7.9 in January of this year.  Followed by primary care.  7.  Disposition: Follow-up in 6 months or sooner if necessary.  Nicolasa Ducking, NP 03/04/2024, 10:32 AM

## 2024-03-04 NOTE — Patient Instructions (Signed)
 Medication Instructions:  CARVEDILOL: Take 12.5 mg in the morning and 6.25 mg in the evening  *If you need a refill on your cardiac medications before your next appointment, please call your pharmacy*   Lab Work: None ordered If you have labs (blood work) drawn today and your tests are completely normal, you will receive your results only by: MyChart Message (if you have MyChart) OR A paper copy in the mail If you have any lab test that is abnormal or we need to change your treatment, we will call you to review the results.   Testing/Procedures: None ordered   Follow-Up: At [facility], you and your health needs are our priority.  As part of our continuing mission to provide you with exceptional heart care, we have created designated Provider Care Teams.  These Care Teams include your primary Cardiologist (physician) and Advanced Practice Providers (APPs -  Physician Assistants and Nurse Practitioners) who all work together to provide you with the care you need, when you need it.  We recommend signing up for the patient portal called "MyChart".  Sign up information is provided on this After Visit Summary.  MyChart is used to connect with patients for Virtual Visits (Telemedicine).  Patients are able to view lab/test results, encounter notes, upcoming appointments, etc.  Non-urgent messages can be sent to your provider as well.   To learn more about what you can do with MyChart, go to [email or web address].    Your next appointment:   6 month(s)  Provider:   You may see Lorine Bears, MD or one of the following Advanced Practice Providers on your designated Care Team:   Nicolasa Ducking, NP

## 2024-03-25 DIAGNOSIS — N184 Chronic kidney disease, stage 4 (severe): Secondary | ICD-10-CM | POA: Diagnosis not present

## 2024-04-01 DIAGNOSIS — E1165 Type 2 diabetes mellitus with hyperglycemia: Secondary | ICD-10-CM | POA: Diagnosis not present

## 2024-04-08 DIAGNOSIS — E11311 Type 2 diabetes mellitus with unspecified diabetic retinopathy with macular edema: Secondary | ICD-10-CM | POA: Diagnosis not present

## 2024-04-08 DIAGNOSIS — I10 Essential (primary) hypertension: Secondary | ICD-10-CM | POA: Diagnosis not present

## 2024-04-08 DIAGNOSIS — N184 Chronic kidney disease, stage 4 (severe): Secondary | ICD-10-CM | POA: Diagnosis not present

## 2024-04-08 DIAGNOSIS — E1159 Type 2 diabetes mellitus with other circulatory complications: Secondary | ICD-10-CM | POA: Diagnosis not present

## 2024-04-08 DIAGNOSIS — E1143 Type 2 diabetes mellitus with diabetic autonomic (poly)neuropathy: Secondary | ICD-10-CM | POA: Diagnosis not present

## 2024-04-08 DIAGNOSIS — E1122 Type 2 diabetes mellitus with diabetic chronic kidney disease: Secondary | ICD-10-CM | POA: Diagnosis not present

## 2024-04-08 DIAGNOSIS — Z794 Long term (current) use of insulin: Secondary | ICD-10-CM | POA: Diagnosis not present

## 2024-04-08 DIAGNOSIS — E1165 Type 2 diabetes mellitus with hyperglycemia: Secondary | ICD-10-CM | POA: Diagnosis not present

## 2024-04-12 ENCOUNTER — Other Ambulatory Visit: Payer: Self-pay

## 2024-04-12 ENCOUNTER — Other Ambulatory Visit: Payer: Self-pay | Admitting: Nurse Practitioner

## 2024-04-12 MED ORDER — IRBESARTAN 75 MG PO TABS
75.0000 mg | ORAL_TABLET | Freq: Every day | ORAL | 1 refills | Status: AC
  Filled 2024-04-12: qty 90, 90d supply, fill #0

## 2024-04-13 DIAGNOSIS — N184 Chronic kidney disease, stage 4 (severe): Secondary | ICD-10-CM | POA: Diagnosis not present

## 2024-04-15 ENCOUNTER — Other Ambulatory Visit: Payer: Self-pay | Admitting: Nurse Practitioner

## 2024-04-26 ENCOUNTER — Other Ambulatory Visit: Payer: Self-pay

## 2024-04-26 ENCOUNTER — Other Ambulatory Visit: Payer: Self-pay | Admitting: Nurse Practitioner

## 2024-04-26 MED ORDER — FUROSEMIDE 20 MG PO TABS: 20.0000 mg | ORAL_TABLET | Freq: Every day | ORAL | 6 refills | Status: AC

## 2024-04-28 ENCOUNTER — Encounter: Payer: Self-pay | Admitting: Internal Medicine

## 2024-04-28 ENCOUNTER — Encounter: Payer: Self-pay | Admitting: Nurse Practitioner

## 2024-04-28 NOTE — Telephone Encounter (Signed)
 LOV 12/19/2023    Requested Prescriptions  Pending Prescriptions Disp Refills   pantoprazole  (PROTONIX ) 40 MG tablet [Pharmacy Med Name: Pantoprazole  Sodium 40 MG Oral Tablet Delayed Release] 180 tablet 0    Sig: Take 1 tablet by mouth twice daily     Gastroenterology: Proton Pump Inhibitors Failed - 04/28/2024  9:31 AM      Failed - Valid encounter within last 12 months    Recent Outpatient Visits   None     Future Appointments             In 1 month Cannady, Lavelle Posey, NP [facility]

## 2024-05-02 LAB — HEMOGLOBIN A1C: Hemoglobin-A1c: 8.6

## 2024-05-02 NOTE — Patient Instructions (Signed)
 Lymphadenopathy  Lymphadenopathy is when your lymph glands are swollen or larger than normal.  Lymph glands, also called lymph nodes, are clumps of tissue. They filter germs and waste from tissues in your body to your bloodstream. They're part of your body's defense system, or immune system. Lymphadenopathy has different causes, like infection, autoimmune disease, and cancer. Lymphadenopathy can happen wherever you have lymph nodes. The type you have depends on which nodes it's in, such as: Cervical lymphadenopathy. This is in the neck. Mediastinal lymphadenopathy. This is in the chest. Hilar lymphadenopathy. This is in the lungs. Axillary lymphadenopathy. This is in the armpits. Inguinal lymphadenopathy. This is in the groin. Sometimes, fluid and cells that fight infection build up in your lymph nodes. This happens when your immune system reacts to germs or other substances that get into your body. This makes lymph nodes swell and get bigger. Treatment is based on what's thought to be the cause. Sometimes, lymph nodes don't go back to normal size after treatment. If yours don't, your health care provider may order tests to help learn why your glands are still swollen and big. Follow these instructions at home:  Take over-the-counter and prescription medicines only as told by your provider. If you were prescribed antibiotics, do not stop using them, even if you start to feel better. If told, apply heat to swollen lymph nodes as told by your provider. Use the heat source that your provider recommends, such as a moist heat pack or a heating pad. Place a towel between your skin and the heat source. Leave the heat on only for the time told by your provider to avoid injury. If your skin turns bright red, remove the heat right away to prevent burns. The risk of burns is higher if you cannot feel pain, heat, or cold. Check your swollen lymph nodes every day for changes. Check other places where you have  lymph nodes as told. Check for changes such as: More swelling. Sudden growth in size. Redness or pain. Hardness. Contact a health care provider if: You have lymph nodes that: Are still swollen after 2 weeks. Have gotten bigger all of a sudden or the swelling spreads. Are red, painful, or hard. Fluid leaks from the skin near a swollen lymph node. You get a fever, chills, or night sweats. You feel tired. You have a sore throat. Your abdomen hurts. You lose weight without trying. This information is not intended to replace advice given to you by your health care provider. Make sure you discuss any questions you have with your health care provider. Document Revised: 02/19/2023 Document Reviewed: 02/19/2023 Elsevier Patient Education  2024 [employer].

## 2024-05-07 ENCOUNTER — Encounter: Payer: Self-pay | Admitting: Nurse Practitioner

## 2024-05-07 ENCOUNTER — Ambulatory Visit (INDEPENDENT_AMBULATORY_CARE_PROVIDER_SITE_OTHER): Admitting: Nurse Practitioner

## 2024-05-07 VITALS — BP 115/67 | HR 97 | Temp 98.5°F | Ht 65.5 in | Wt 266.8 lb

## 2024-05-07 DIAGNOSIS — L723 Sebaceous cyst: Secondary | ICD-10-CM | POA: Diagnosis not present

## 2024-05-07 MED ORDER — MUPIROCIN 2 % EX OINT: 1.0000 | TOPICAL_OINTMENT | Freq: Two times a day (BID) | CUTANEOUS | 0 refills | Status: AC

## 2024-05-07 MED ORDER — DOXYCYCLINE HYCLATE 100 MG PO TABS: 100.0000 mg | ORAL_TABLET | Freq: Two times a day (BID) | ORAL | 0 refills | Status: AC

## 2024-05-07 NOTE — Assessment & Plan Note (Signed)
 Acute and area drained on own Monday.  Currently is closed over, but firmness still present.  Suspect some hidradenitis suppurativa present based on history.  Will start Doxycyline 100 MG BID for 7 days and Mupirocin  due to some ongoing redness and tenderness.  Recommend she cleanse axilla 3 times a week with Hibiclens .  Will get into general surgery to assess right axilla and see if can remove remainder to help prevent recurrence.   Discussed with her if ongoing issues then may need to send to dermatology in future.

## 2024-05-07 NOTE — Progress Notes (Signed)
 BP 115/67   Pulse 97   Temp 98.5 F (36.9 C) (Oral)   Ht 5' 5.5" (1.664 m)   Wt 266 lb 12.8 oz (121 kg)   SpO2 97%   BMI 43.72 kg/m    Subjective:    Patient ID: [name] [name], female    DOB: March 23, 1981, 43 y.o.   MRN: [id number]  HPI: [name] [name] is a 43 y.o. female  Chief Complaint  Patient presents with   Adenopathy    Patient states she noticed a swollen area under her R arm pit about a month ago. States the area was swelling a lot but then it drained pus for about 2 days, Monday and Tuesday of this week. States since then, the swelling has gone down.    SKIN INFECTION About a month ago started with swollen area under right axilla.  The area drained on Monday and Tuesday this week, purulent.  Has similar around Thanksgiving 2024 to same area, they will alternate but often is right axilla. Duration: months Location: right axilla History of trauma in area: no Pain: no improved Quality: no Severity: none Redness: no Swelling: going down Oozing: no Pus: not anymore Fevers: a little here and there Nausea/vomiting: nausea Status: stable Treatments attempted: warm compresses  Tetanus: UTD   Relevant past medical, surgical, family and social history reviewed and updated as indicated. Interim medical history since our last visit reviewed. Allergies and medications reviewed and updated.  Review of Systems  Constitutional:  Negative for activity change, appetite change, diaphoresis, fatigue and fever.  Respiratory:  Negative for cough, chest tightness, shortness of breath and wheezing.   Cardiovascular:  Negative for chest pain, palpitations and leg swelling.  Gastrointestinal: Negative.   Skin:  Positive for wound.  Neurological: Negative.   Psychiatric/Behavioral: Negative.      Per HPI unless specifically indicated above     Objective:     BP 115/67   Pulse 97   Temp 98.5 F (36.9 C) (Oral)   Ht 5' 5.5" (1.664 m)   Wt 266 lb 12.8 oz (121 kg)   SpO2  97%   BMI 43.72 kg/m   Wt Readings from Last 3 Encounters:  05/07/24 266 lb 12.8 oz (121 kg)  03/04/24 264 lb 6.4 oz (119.9 kg)  12/19/23 274 lb 6.4 oz (124.5 kg)    Physical Exam Vitals and nursing note reviewed.  Constitutional:      General: She is awake. She is not in acute distress.    Appearance: She is well-developed and well-groomed. She is obese. She is not ill-appearing or toxic-appearing.  HENT:     Head: Normocephalic.     Right Ear: Hearing and external ear normal.     Left Ear: Hearing and external ear normal.  Eyes:     General: Lids are normal.        Right eye: No discharge.        Left eye: No discharge.     Conjunctiva/sclera: Conjunctivae normal.     Pupils: Pupils are equal, round, and reactive to light.  Neck:     Thyroid : No thyromegaly.     Vascular: No carotid bruit.  Cardiovascular:     Rate and Rhythm: Normal rate and regular rhythm.     Heart sounds: Normal heart sounds. No murmur heard.    No gallop.  Pulmonary:     Effort: Pulmonary effort is normal. No accessory muscle usage or respiratory distress.     Breath sounds: Normal  breath sounds.  Abdominal:     General: Bowel sounds are normal. There is no distension.     Palpations: Abdomen is soft.     Tenderness: There is no abdominal tenderness.  Musculoskeletal:     Cervical back: Normal range of motion and neck supple.     Right lower leg: No edema.     Left lower leg: No edema.  Lymphadenopathy:     Cervical: No cervical adenopathy.  Skin:    General: Skin is warm and dry.     Findings: Abscess present.     Comments: Area to under right axilla now closed, however firmness palpated approx 3 cm long with tunneling. No warmth. Mild redness.  No tenderness.  Neurological:     Mental Status: She is alert and oriented to person, place, and time.     Deep Tendon Reflexes: Reflexes are normal and symmetric.     Reflex Scores:      Brachioradialis reflexes are 2+ on the right side and 2+ on  the left side.      Patellar reflexes are 2+ on the right side and 2+ on the left side. Psychiatric:        Attention and Perception: Attention normal.        Mood and Affect: Mood normal.        Speech: Speech normal.        Behavior: Behavior normal. Behavior is cooperative.        Thought Content: Thought content normal.    Results for orders placed or performed in visit on 05/07/24  Hemoglobin A1c   Collection Time: 04/01/24 12:00 AM  Result Value Ref Range   Hemoglobin-A1c 8.6%       Assessment & Plan:   Problem List Items Addressed This Visit       Musculoskeletal and Integument   Sebaceous cyst of right axilla - Primary   Acute and area drained on own Monday.  Currently is closed over, but firmness still present.  Suspect some hidradenitis suppurativa present based on history.  Will start Doxycyline 100 MG BID for 7 days and Mupirocin  due to some ongoing redness and tenderness.  Recommend she cleanse axilla 3 times a week with Hibiclens .  Will get into general surgery to assess right axilla and see if can remove remainder to help prevent recurrence.   Discussed with her if ongoing issues then may need to send to dermatology in future.      Relevant Orders   Ambulatory referral to General Surgery     Follow up plan: Return if symptoms worsen or fail to improve.

## 2024-05-10 ENCOUNTER — Encounter: Payer: Self-pay | Admitting: Internal Medicine

## 2024-05-11 ENCOUNTER — Encounter: Payer: Self-pay | Admitting: Internal Medicine

## 2024-05-11 ENCOUNTER — Ambulatory Visit: Admitting: Emergency Medicine

## 2024-05-11 VITALS — Ht 65.0 in | Wt 266.0 lb

## 2024-05-11 DIAGNOSIS — Z1231 Encounter for screening mammogram for malignant neoplasm of breast: Secondary | ICD-10-CM

## 2024-05-11 DIAGNOSIS — Z0000 Encounter for general adult medical examination without abnormal findings: Secondary | ICD-10-CM | POA: Diagnosis not present

## 2024-05-11 NOTE — Progress Notes (Signed)
 Subjective:   [name] [name] is a 43 y.o. who presents for a Medicare Wellness preventive visit.  As a reminder, Annual Wellness Visits don't include a physical exam, and some assessments may be limited, especially if this visit is performed virtually. We may recommend an in-person follow-up visit with your provider if needed.  Visit Complete: Virtual I connected with  [name] [name] on 05/11/24 by a audio enabled telemedicine application and verified that I am speaking with the correct person using two identifiers.  Patient Location: Home  Provider Location: Office/Clinic  I discussed the limitations of evaluation and management by telemedicine. The patient expressed understanding and agreed to proceed.  Vital Signs: Because this visit was a virtual/telehealth visit, some criteria may be missing or patient reported. Any vitals not documented were not able to be obtained and vitals that have been documented are patient reported.  VideoDeclined- This patient declined Librarian, academic. Therefore the visit was completed with audio only.  Persons Participating in Visit: Patient.  AWV Questionnaire: No: Patient Medicare AWV questionnaire was not completed prior to this visit.  Cardiac Risk Factors include: diabetes mellitus;dyslipidemia;obesity (BMI >30kg/m2);Other (see comment), Risk factor comments: CAD, OSA (no cpap)     Objective:     Today's Vitals   05/11/24 1308  Weight: 266 lb (120.7 kg)  Height: 5\' 5"  (1.651 m)  PainSc: 4    Body mass index is 44.26 kg/m.     05/11/2024    1:28 PM 09/05/2023   10:00 AM 06/05/2023   11:07 AM 09/24/2021    3:19 PM 09/19/2021   11:24 AM 10/11/2020   12:36 AM 10/08/2020    1:05 AM  Advanced Directives  Does Patient Have a Medical Advance Directive? No No No No No No No  Would patient like information on creating a medical advance directive? Yes (MAU/Ambulatory/Procedural Areas - Information given)  No -  Patient declined  Yes (MAU/Ambulatory/Procedural Areas - Information given)  No - Patient declined    Current Medications (verified) Outpatient Encounter Medications as of 05/11/2024  Medication Sig   acetaminophen  (TYLENOL ) 500 MG tablet Take 1,000 mg by mouth every 4 (four) hours as needed.   Alirocumab  (PRALUENT ) 150 MG/ML SOAJ Inject 1 mL (150 mg total) into the skin every 14 (fourteen) days.   allopurinol  (ZYLOPRIM ) 100 MG tablet Take 1/2 (one-half) tablet by mouth once daily   aspirin  EC 81 MG tablet Take 81 mg by mouth daily.   azelastine  (ASTELIN ) 0.1 % nasal spray Place 1 spray into both nostrils 2 (two) times daily as needed. Use in each nostril as directed   BD PEN NEEDLE MICRO U/F 32G X 6 MM MISC Inject into the skin.   benzonatate  (TESSALON ) 100 MG capsule Take 1 capsule (100 mg total) by mouth 2 (two) times daily as needed for cough.   carvedilol  (COREG ) 12.5 MG tablet Take one tablet in the morning and half a tablet in the evening   cetirizine  (ZYRTEC  ALLERGY ) 10 MG tablet Take 1 tablet (10 mg total) by mouth daily.   Cholecalciferol  1.25 MG (50000 UT) TABS Take 1 tablet by mouth once a week.   clopidogrel  (PLAVIX ) 75 MG tablet Take 1 tablet (75 mg total) by mouth daily.   Continuous Blood Gluc Sensor (DEXCOM G6 SENSOR) MISC See admin instructions.   doxycycline  (VIBRA -TABS) 100 MG tablet Take 1 tablet (100 mg total) by mouth 2 (two) times daily for 7 days.   EPINEPHrine  0.3 mg/0.3 mL IJ SOAJ injection  Inject 0.3 mg into the muscle as needed for anaphylaxis.   etonogestrel  (NEXPLANON ) 68 MG IMPL implant 1 each (68 mg total) by Subdermal route once for 1 dose.   ezetimibe  (ZETIA ) 10 MG tablet Take 1 tablet by mouth once daily   famotidine  (PEPCID ) 40 MG tablet Take 1 tablet (40 mg total) by mouth daily.   furosemide  (LASIX ) 20 MG tablet Take 1 tablet (20 mg total) by mouth daily.   insulin  aspart (NOVOLOG ) 100 UNIT/ML injection Inject 10 Units into the skin 3 (three) times daily  with meals.   insulin  glargine-yfgn (SEMGLEE ) 100 UNIT/ML Pen Inject 110 Units into the skin at bedtime.   irbesartan  (AVAPRO ) 75 MG tablet Take 1 tablet (75 mg total) by mouth daily.   isosorbide  mononitrate (IMDUR ) 120 MG 24 hr tablet Take 1 tablet (120 mg total) by mouth daily.   LOKELMA 10 g PACK packet Take 10 g by mouth daily.   mupirocin  ointment (BACTROBAN ) 2 % Apply 1 Application topically 2 (two) times daily. Under axilla area.   nitroGLYCERIN  (NITROSTAT ) 0.4 MG SL tablet Take 1 tab under your tongue while sitting for chest pain, If no relief may repeat, one tab every 5 min up to 3 tabs total over 15 mins.   Olopatadine  HCl 0.2 % SOLN Apply 1 drop to eye daily as needed (itchy watery eyes).   pantoprazole  (PROTONIX ) 40 MG tablet Take 1 tablet by mouth twice daily   promethazine  (PHENERGAN ) 25 MG tablet Take 25 mg by mouth every 6 (six) hours as needed for nausea or vomiting.   Semaglutide , 1 MG/DOSE, (OZEMPIC , 1 MG/DOSE,) 2 MG/1.5ML SOPN Inject 1 mg into the skin once a week.   XHANCE  93 MCG/ACT EXHU Place 1 spray into both nostrils 2 (two) times daily.   Continuous Blood Gluc Receiver (DEXCOM G6 RECEIVER) DEVI  (Patient not taking: Reported on 05/11/2024)   Continuous Blood Gluc Transmit (DEXCOM G6 TRANSMITTER) MISC  (Patient not taking: Reported on 05/11/2024)   Facility-Administered Encounter Medications as of 05/11/2024  Medication   sodium chloride  flush (NS) 0.9 % injection 3 mL    Allergies (verified) Lisinopril , Other, Rosemary oil, Shellfish allergy , Statins, and Metformin  and related   History: Past Medical History:  Diagnosis Date   CAD (coronary artery disease)    a. 03/2015 NSTEMI/PCI: LCX 19m (DES), OM1 100p (DES - vessel labeled Ramus in subsequent cath report); b. 04/2015 Cath: patent stents/stable anatomy; c. 02/2016 MV: No isch; d. 11/2018 PCI: LCX 95p/m (PTCA->20%); e. 01/2019 PCI: RCA 85p/m (2.5x32 Synergy DES); f. 01/2021 Cath: LM 30, LAD 40ost/p, 16m/d, LCX 55p  restenosis, OM1 10, OM2 40, RCA patent stent-->Med rx.   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 03/2015 Echo: EF 30-35%, mild concentric LVH, severe HK of inf, inflat, & lat walls, mod MR, mild TR;  b. 04/2015 LV Gram: EF 55-65%; c. 09/2017 Echo: EF 55-60%, no rwma. Mild MR; d. 11/2018 Echo: Ef 60-65%, no rwma; e. 06/2022 Echo: EF 60-65%, nl RV fxn, mildly dil LA, mild MR.   CKD (chronic kidney disease), stage IV (HCC)    Diabetes type 2, controlled (HCC)    a. since 2002    Diabetic gastroparesis (HCC)    a. chronic nausea/vomiting.   Diabetic retinopathy (HCC)    Diverticulosis, sigmoid 07/2016   Fatty liver    GERD (gastroesophageal reflux disease)  Heavy menses    a. H/O IUD - expired in 2014 - remains in place.   Hx of migraines    Hyperkalemia    a. On chronic lokelma - managed by nephrology.   Hyperlipidemia    Intolerant of atorvastatin , rosuvastatin    Hypertension    Iron  deficiency anemia    Ischemic cardiomyopathy (resolved)    a. 03/2015 EF 30-35% post NSTEMI;  b. 04/2015 EF 55-65% on LV gram; c. 09/2017 Echo: EF 55-60%; d. 11/2018 Echo: Ef 60-65%; e. 06/2022 Echo: EF 60-65%.   Obesity    Tobacco abuse    a. quit 03/2015.   Uterine fibroid    Vertigo    Vitamin D  deficiency    Past Surgical History:  Procedure Laterality Date   APPENDECTOMY     CARDIAC CATHETERIZATION  03/2015   x2 stent [facility]   CARDIAC CATHETERIZATION N/A 05/05/2015   Procedure: Left Heart Cath and Coronary Angiography;  Surgeon: Peter M Swaziland, MD;  Location: [facility] INVASIVE CV LAB;  Service: Cardiovascular;  Laterality: N/A;   CATARACT EXTRACTION Left 09/12/2021   CATARACT EXTRACTION Right 10/10/2021   COLONOSCOPY WITH PROPOFOL  N/A 07/31/2016   Procedure: COLONOSCOPY WITH PROPOFOL ;  Surgeon: Deveron Fly, MD;  Location: [facility] ENDOSCOPY;  Service: Endoscopy;  Laterality: N/A;   CORONARY BALLOON  ANGIOPLASTY N/A 12/04/2018   Procedure: CORONARY BALLOON ANGIOPLASTY;  Surgeon: Wenona Hamilton, MD;  Location: [facility] INVASIVE CV LAB;  Service: Cardiovascular;  Laterality: N/A;   CORONARY PRESSURE/FFR STUDY N/A 12/04/2018   Procedure: INTRAVASCULAR PRESSURE WIRE/FFR STUDY;  Surgeon: Wenona Hamilton, MD;  Location: [facility] INVASIVE CV LAB;  Service: Cardiovascular;  Laterality: N/A;   CORONARY STENT INTERVENTION N/A 01/12/2019   Procedure: CORONARY STENT INTERVENTION;  Surgeon: Sammy Crisp, MD;  Location: [facility] INVASIVE CV LAB;  Service: Cardiovascular;  Laterality: N/A;   ESOPHAGOGASTRODUODENOSCOPY (EGD) WITH PROPOFOL  N/A 07/31/2016   Procedure: ESOPHAGOGASTRODUODENOSCOPY (EGD) WITH PROPOFOL ;  Surgeon: Deveron Fly, MD;  Location: [facility] ENDOSCOPY;  Service: Endoscopy;  Laterality: N/A;   EYE SURGERY  07/16/2022   Membrane peeled   LAPAROSCOPIC APPENDECTOMY N/A 08/07/2016   Procedure: APPENDECTOMY LAPAROSCOPIC;  Surgeon: Kandis Ormond, MD;  Location: [facility] ORS;  Service: General;  Laterality: N/A;   LEFT HEART CATH AND CORONARY ANGIOGRAPHY Left 12/04/2018   Procedure: LEFT HEART CATH AND CORONARY ANGIOGRAPHY;  Surgeon: Wenona Hamilton, MD;  Location: [facility] INVASIVE CV LAB;  Service: Cardiovascular;  Laterality: Left;   LEFT HEART CATH AND CORONARY ANGIOGRAPHY N/A 01/12/2019   Procedure: LEFT HEART CATH AND CORONARY ANGIOGRAPHY;  Surgeon: Sammy Crisp, MD;  Location: [facility] INVASIVE CV LAB;  Service: Cardiovascular;  Laterality: N/A;   MOUTH SURGERY     RIGHT/LEFT HEART CATH AND CORONARY ANGIOGRAPHY N/A 01/29/2021   Procedure: RIGHT/LEFT HEART CATH AND CORONARY ANGIOGRAPHY;  Surgeon: Wenona Hamilton, MD;  Location: [facility] INVASIVE CV LAB;  Service: Cardiovascular;  Laterality: N/A;   VITRECTOMY Left 10/30/2021   Family History  Problem Relation Age of Onset   Diabetes Mother    Heart disease Father    Diabetes Father    Cancer - Cervical Maternal Aunt    Cancer Maternal  Grandmother        lung   Cancer Maternal Grandfather        prostate   Diabetes Paternal Grandmother    Diabetes Paternal Grandfather    Social History   Socioeconomic History   Marital status: Legally Separated    Spouse name: Ameer   Number  of children: 1   Years of education: Not on file   Highest education level: Some college, no degree  Occupational History   Occupation: disability  Tobacco Use   Smoking status: Former    Current packs/day: 0.00    Average packs/day: 0.3 packs/day for 15.0 years (4.5 ttl pk-yrs)    Types: Cigarettes    Start date: 03/09/2000    Quit date: 03/10/2015    Years since quitting: 9.1    Passive exposure: Current   Smokeless tobacco: Never  Vaping Use   Vaping status: Never Used  Substance and Sexual Activity   Alcohol use: No    Alcohol/week: 0.0 standard drinks of alcohol   Drug use: Not Currently   Sexual activity: Yes    Birth control/protection: Implant  Other Topics Concern   Not on file  Social History Narrative   Lives locally with daughter and husband.   05/11/24 legally separated from husband   Social Drivers of Health   Financial Resource Strain: Low Risk  (05/11/2024)   Overall Financial Resource Strain (CARDIA)    Difficulty of Paying Living Expenses: Not hard at all  Food Insecurity: No Food Insecurity (05/11/2024)   Hunger Vital Sign    Worried About Running Out of Food in the Last Year: Never true    Ran Out of Food in the Last Year: Never true  Transportation Needs: No Transportation Needs (05/11/2024)   PRAPARE - Administrator, Civil Service (Medical): No    Lack of Transportation (Non-Medical): No  Physical Activity: Inactive (05/11/2024)   Exercise Vital Sign    Days of Exercise per Week: 0 days    Minutes of Exercise per Session: 0 min  Stress: No Stress Concern Present (05/11/2024)   [employer] of Occupational Health - Occupational Stress Questionnaire    Feeling of Stress : Not at all  Social  Connections: Moderately Integrated (05/11/2024)   Social Connection and Isolation Panel [NHANES]    Frequency of Communication with Friends and Family: More than three times a week    Frequency of Social Gatherings with Friends and Family: More than three times a week    Attends Religious Services: More than 4 times per year    Active Member of [employer] or Organizations: Yes    Attends Engineer, structural: More than 4 times per year    Marital Status: Separated    Tobacco Counseling Counseling given: No    Clinical Intake:  Pre-visit preparation completed: Yes  Pain : 0-10 Pain Score: 4  Pain Type: Chronic pain Pain Location: Knee Pain Orientation: Left Pain Descriptors / Indicators: Aching     BMI - recorded: 44.26 Nutritional Status: BMI > 30  Obese Nutritional Risks: Nausea/ vomitting/ diarrhea (nausea) Diabetes: Yes CBG done?: No (FBS 115 per patient) Did pt. bring in CBG monitor from home?: No  Lab Results  Component Value Date   HGBA1C 8.6% 04/01/2024   HGBA1C 7.6 (H) 05/29/2023   HGBA1C 9.0 12/15/2019     How often do you need to have someone help you when you read instructions, pamphlets, or other written materials from your doctor or pharmacy?: 1 - Never  Interpreter Needed?: No  Information entered by :: Jaunita Messier, CMA   Activities of Daily Living     05/11/2024    1:11 PM  In your present state of health, do you have any difficulty performing the following activities:  Hearing? 0  Vision? 0  Difficulty concentrating or  making decisions? 0  Walking or climbing stairs? 1  Comment sometimes, due to knee pain  Dressing or bathing? 0  Doing errands, shopping? 0  Preparing Food and eating ? N  Using the Toilet? N  In the past six months, have you accidently leaked urine? N  Do you have problems with loss of bowel control? N  Managing your Medications? N  Managing your Finances? N  Housekeeping or managing your Housekeeping? N    Patient  Care Team: Cannady, Jolene T, NP as PCP - General (Nurse Practitioner) Wenona Hamilton, MD as Consulting Physician (Cardiology) Agrawal, Kavita, MD (Inactive) as Consulting Physician (Oncology) Marin Shutters, MD as Consulting Physician (Optometry) Solum, Nicolas Barren, MD as Physician Assistant (Endocrinology) Rica Chalet, MD (Nephrology)  I have updated your Care Teams any recent Medical Services you may have received from other providers in the past year.     Assessment:    This is a routine wellness examination for [place].  Hearing/Vision screen Hearing Screening - Comments:: Denies hearing loss Vision Screening - Comments:: Gets DM eye exams, Dr. Lendia Quay, [facility] [place]   Goals Addressed               This Visit's Progress     Increase physical activity (pt-stated)        And lose weight       Depression Screen     05/11/2024    1:21 PM 05/07/2024    3:41 PM 12/19/2023    8:31 AM 11/10/2023    4:11 PM 10/21/2023    9:54 AM 06/05/2023   11:13 AM 05/29/2023    8:54 AM  PHQ 2/9 Scores  PHQ - 2 Score 0 0 0 0 0 0 0  PHQ- 9 Score 6 6 4 1 6  0 2    Fall Risk     05/11/2024    1:31 PM 05/07/2024    3:41 PM 12/19/2023    8:31 AM 10/21/2023    9:54 AM 05/29/2023    8:54 AM  Fall Risk   Falls in the past year? 0 0 0 0 0  Number falls in past yr: 0 0 0 0 0  Injury with Fall? 0 0 0 0 0  Risk for fall due to : No Fall Risks No Fall Risks No Fall Risks No Fall Risks No Fall Risks  Follow up Falls evaluation completed Falls evaluation completed Falls evaluation completed Falls evaluation completed Falls evaluation completed    MEDICARE RISK AT HOME:  Medicare Risk at Home Any stairs in or around the home?: Yes If so, are there any without handrails?: No Home free of loose throw rugs in walkways, pet beds, electrical cords, etc?: Yes Adequate lighting in your home to reduce risk of falls?: Yes Life alert?: No Use of a cane, walker or w/c?: No Grab bars in the  bathroom?: Yes Shower chair or bench in shower?: No Elevated toilet seat or a handicapped toilet?: No  TIMED UP AND GO:  Was the test performed?  No  Cognitive Function: 6CIT completed        05/11/2024    1:31 PM 09/24/2021    3:20 PM 09/18/2020    1:51 PM  6CIT Screen  What Year? 0 points 0 points 0 points  What month? 0 points 0 points 0 points  What time? 0 points 0 points 0 points  Count back from 20 0 points 0 points 0 points  Months in reverse 0 points  2 points 0 points  Repeat phrase 0 points 0 points 0 points  Total Score 0 points 2 points 0 points    Immunizations Immunization History  Administered Date(s) Administered   Dtap, Unspecified 08/09/1980, 10/14/1980, 12/16/1980, 01/22/1982, 07/20/1998   Hep B, Unspecified 08/13/1996, 09/24/1996, 03/11/1997   Influenza, Seasonal, Injecte, Preservative Fre 01/30/2010, 12/13/2010, 10/21/2023   Influenza,inj,Quad PF,6+ Mos 08/30/2015, 09/02/2016, 08/12/2017, 08/12/2018, 08/18/2019, 11/14/2021, 11/27/2022   Influenza-Unspecified 09/22/1998, 11/14/2021   MMR 04/21/1981, 07/21/1998   Pneumococcal Polysaccharide-23 12/15/2015   Polio, Unspecified 08/09/1980, 10/14/1980, 12/16/1980, 01/22/1982   Tdap 07/20/2016    Screening Tests Health Maintenance  Topic Date Due   Pneumococcal Vaccine 69-34 Years old (2 of 2 - PCV) 01/23/2045 (Originally 12/14/2016)   INFLUENZA VACCINE  07/09/2024   HEMOGLOBIN A1C  10/01/2024   OPHTHALMOLOGY EXAM  10/23/2024   Diabetic kidney evaluation - eGFR measurement  12/18/2024   Diabetic kidney evaluation - Urine ACR  12/18/2024   FOOT EXAM  12/18/2024   Medicare Annual Wellness (AWV)  05/11/2025   DTaP/Tdap/Td (7 - Td or Tdap) 07/20/2026   Cervical Cancer Screening (HPV/Pap Cotest)  08/17/2026   Hepatitis C Screening  Completed   HIV Screening  Completed   HPV VACCINES  Aged Out   Meningococcal B Vaccine  Aged Out   COVID-19 Vaccine  Discontinued    Health Maintenance  There are no  preventive care reminders to display for this patient.  Health Maintenance Items Addressed: Mammogram ordered, See Nurse Notes at the end of this note  Additional Screening:  Vision Screening: Recommended annual ophthalmology exams for early detection of glaucoma and other disorders of the eye. Would you like a referral to an eye doctor? No    Dental Screening: Recommended annual dental exams for proper oral hygiene  Community Resource Referral / Chronic Care Management: CRR required this visit?  No   CCM required this visit?  No   Plan:    I have personally reviewed and noted the following in the patient's chart:   Medical and social history Use of alcohol, tobacco or illicit drugs  Current medications and supplements including opioid prescriptions. Patient is not currently taking opioid prescriptions. Functional ability and status Nutritional status Physical activity Advanced directives List of other physicians Hospitalizations, surgeries, and ER visits in previous 12 months Vitals Screenings to include cognitive, depression, and falls Referrals and appointments  In addition, I have reviewed and discussed with patient certain preventive protocols, quality metrics, and best practice recommendations. A written personalized care plan for preventive services as well as general preventive health recommendations were provided to patient.   Jaunita Messier, CMA   05/11/2024   After Visit Summary: (MyChart) Due to this being a telephonic visit, the after visit summary with patients personalized plan was offered to patient via MyChart   Notes: Please refer to Routing Comments.

## 2024-05-11 NOTE — Patient Instructions (Signed)
 [name] , Thank you for taking time out of your busy schedule to complete your Annual Wellness Visit with me. I enjoyed our conversation and look forward to speaking with you again next year. I, as well as your care team,  appreciate your ongoing commitment to your health goals. Please review the following plan we discussed and let me know if I can assist you in the future. Your Game plan/ To Do List    Referrals: None  Follow up Visits: Next Medicare AWV with our clinical staff: 05/17/25 @ 1:10pm (phone visit)   Have you seen your provider in the last 6 months (3 months if uncontrolled diabetes)? Yes Next Office Visit with your provider: 06/18/24 @ 8:40am with Jolene Cannady, NP  Clinician Recommendations: I have placed an order for a mammogram. Call [facility] @ [phone] to schedule at your convenience.  Aim for 30 minutes of exercise or brisk walking, 6-8 glasses of water, and 5 servings of fruits and vegetables each day.       This is a list of the screening recommended for you and due dates:  Health Maintenance  Topic Date Due   Pneumococcal Vaccination (2 of 2 - PCV) 01/23/2045*   Flu Shot  07/09/2024   Hemoglobin A1C  10/01/2024   Eye exam for diabetics  10/23/2024   Yearly kidney function blood test for diabetes  12/18/2024   Yearly kidney health urinalysis for diabetes  12/18/2024   Complete foot exam   12/18/2024   Medicare Annual Wellness Visit  05/11/2025   DTaP/Tdap/Td vaccine (7 - Td or Tdap) 07/20/2026   Pap with HPV screening  08/17/2026   Hepatitis C Screening  Completed   HIV Screening  Completed   HPV Vaccine  Aged Out   Meningitis B Vaccine  Aged Out   COVID-19 Vaccine  Discontinued  *Topic was postponed. The date shown is not the original due date.    Advanced directives: (ACP Link)Information on Advanced Care Planning can be found at [place]  Secretary of [facility] Advance Health Care Directives Advance Health Care Directives. [email or web address]   Advance Care Planning is important because it:  [x]  Makes sure you receive the medical care that is consistent with your values, goals, and preferences  [x]  It provides guidance to your family and loved ones and reduces their decisional burden about whether or not they are making the right decisions based on your wishes.  Follow the link provided in your after visit summary or read over the paperwork we have mailed to you to help you started getting your Advance Directives in place. If you need assistance in completing these, please reach out to us  so that we can help you!  See attachments for Preventive Care and Fall Prevention Tips.   Fall Prevention in the Home, Adult Falls can cause injuries and affect people of all ages. There are many simple things that you can do to make your home safe and to help prevent falls. If you need it, ask for help making these changes. What actions can I take to prevent falls? General information Use good lighting in all rooms. Make sure to: Replace any light bulbs that burn out. Turn on lights if it is dark and use night-lights. Keep items that you use often in easy-to-reach places. Lower the shelves around your home if needed. Move furniture so that there are clear paths around it. Do not keep throw rugs or other things on the floor that can make you  trip. If any of your floors are uneven, fix them. Add color or contrast paint or tape to clearly mark and help you see: Grab bars or handrails. First and last steps of staircases. Where the edge of each step is. If you use a ladder or stepladder: Make sure that it is fully opened. Do not climb a closed ladder. Make sure the sides of the ladder are locked in place. Have someone hold the ladder while you use it. Know where your pets are as you move through your home. What can I do in the bathroom?     Keep the floor dry. Clean up any water that is on the floor right away. Remove soap buildup in the  bathtub or shower. Buildup makes bathtubs and showers slippery. Use non-skid mats or decals on the floor of the bathtub or shower. Attach bath mats securely with double-sided, non-slip rug tape. If you need to sit down while you are in the shower, use a non-slip stool. Install grab bars by the toilet and in the bathtub and shower. Do not use towel bars as grab bars. What can I do in the bedroom? Make sure that you have a light by your bed that is easy to reach. Do not use any sheets or blankets on your bed that hang to the floor. Have a firm bench or chair with side arms that you can use for support when you get dressed. What can I do in the kitchen? Clean up any spills right away. If you need to reach something above you, use a sturdy step stool that has a grab bar. Keep electrical cables out of the way. Do not use floor polish or wax that makes floors slippery. What can I do with my stairs? Do not leave anything on the stairs. Make sure that you have a light switch at the top and the bottom of the stairs. Have them installed if you do not have them. Make sure that there are handrails on both sides of the stairs. Fix handrails that are broken or loose. Make sure that handrails are as long as the staircases. Install non-slip stair treads on all stairs in your home if they do not have carpet. Avoid having throw rugs at the top or bottom of stairs, or secure the rugs with carpet tape to prevent them from moving. Choose a carpet design that does not hide the edge of steps on the stairs. Make sure that carpet is firmly attached to the stairs. Fix any carpet that is loose or worn. What can I do on the outside of my home? Use bright outdoor lighting. Repair the edges of walkways and driveways and fix any cracks. Clear paths of anything that can make you trip, such as tools or rocks. Add color or contrast paint or tape to clearly mark and help you see high doorway thresholds. Trim any bushes or  trees on the main path into your home. Check that handrails are securely fastened and in good repair. Both sides of all steps should have handrails. Install guardrails along the edges of any raised decks or porches. Have leaves, snow, and ice cleared regularly. Use sand, salt, or ice melt on walkways during winter months if you live where there is ice and snow. In the garage, clean up any spills right away, including grease or oil spills. What other actions can I take? Review your medicines with your health care provider. Some medicines can make you confused or feel dizzy. This  can increase your chance of falling. Wear closed-toe shoes that fit well and support your feet. Wear shoes that have rubber soles and low heels. Use a cane, walker, scooter, or crutches that help you move around if needed. Talk with your provider about other ways that you can decrease your risk of falls. This may include seeing a physical therapist to learn to do exercises to improve movement and strength. Where to find more information Centers for Disease Control and Prevention, STEADI: TonerPromos.no [employer] on Aging: BaseRingTones.pl National Institute on Aging: BaseRingTones.pl Contact a health care provider if: You are afraid of falling at home. You feel weak, drowsy, or dizzy at home. You fall at home. Get help right away if you: Lose consciousness or have trouble moving after a fall. Have a fall that causes a head injury. These symptoms may be an emergency. Get help right away. Call 911. Do not wait to see if the symptoms will go away. Do not drive yourself to the hospital. This information is not intended to replace advice given to you by your health care provider. Make sure you discuss any questions you have with your health care provider. Document Revised: 07/29/2022 Document Reviewed: 07/29/2022 Elsevier Patient Education  2024 [employer].

## 2024-05-12 ENCOUNTER — Ambulatory Visit: Admitting: Nurse Practitioner

## 2024-05-13 ENCOUNTER — Encounter: Payer: Self-pay | Admitting: Internal Medicine

## 2024-05-13 ENCOUNTER — Ambulatory Visit: Admitting: General Surgery

## 2024-05-13 ENCOUNTER — Encounter: Payer: Self-pay | Admitting: General Surgery

## 2024-05-13 VITALS — BP 127/81 | HR 85 | Temp 98.5°F | Ht 65.5 in | Wt 270.0 lb

## 2024-05-13 DIAGNOSIS — R599 Enlarged lymph nodes, unspecified: Secondary | ICD-10-CM

## 2024-05-13 NOTE — Patient Instructions (Signed)
 Please call the office if you have any questions or concerns.   If you feel like its coming back or starts to drain/swell please call the office and we can work you in for an appointment

## 2024-05-14 NOTE — Progress Notes (Signed)
 Patient ID: [name] [name], female   DOB: 01/05/1981, 43 y.o.   MRN: [id number] CC: Right axillary drainage, mass suspected, possible HS History of Present Illness [name] [name] is a 43 y.o. female with past medical history as below specifically coronary artery disease, congestive heart failure on Plavix , diabetes and kidney disease who presents in consultation for right axillary drainage.  The patient reports that last week she noted some swelling and pain under her right axilla.  She said there was some redness and this eventually progressed into some drainage from the area.  She reports that initially she thought this was secondary to adenopathy as she does get swollen lymph nodes every time she gets sick.  She says a similar thing happened several months ago where it did drain.  She says that since last week there has been no further drainage and the area seems to be healing up well.  She denies any scarring or toes in the area or other lesions in her groin or left armpit..  Past Medical History Past Medical History:  Diagnosis Date   CAD (coronary artery disease)    a. 03/2015 NSTEMI/PCI: LCX 179m (DES), OM1 100p (DES - vessel labeled Ramus in subsequent cath report); b. 04/2015 Cath: patent stents/stable anatomy; c. 02/2016 MV: No isch; d. 11/2018 PCI: LCX 95p/m (PTCA->20%); e. 01/2019 PCI: RCA 85p/m (2.5x32 Synergy DES); f. 01/2021 Cath: LM 30, LAD 40ost/p, 44m/d, LCX 55p restenosis, OM1 10, OM2 40, RCA patent stent-->Med rx.   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 03/2015 Echo: EF 30-35%, mild concentric LVH, severe HK of inf, inflat, & lat walls, mod MR, mild TR;  b. 04/2015 LV Gram: EF 55-65%; c. 09/2017 Echo: EF 55-60%, no rwma. Mild MR; d. 11/2018 Echo: Ef 60-65%, no rwma; e. 06/2022 Echo: EF 60-65%, nl RV fxn, mildly dil LA, mild MR.   CKD (chronic kidney disease), stage IV (HCC)    Diabetes  type 2, controlled (HCC)    a. since 2002    Diabetic gastroparesis (HCC)    a. chronic nausea/vomiting.   Diabetic retinopathy (HCC)    Diverticulosis, sigmoid 07/2016   Fatty liver    GERD (gastroesophageal reflux disease)    Heavy menses    a. H/O IUD - expired in 2014 - remains in place.   Hx of migraines    Hyperkalemia    a. On chronic lokelma - managed by nephrology.   Hyperlipidemia    Intolerant of atorvastatin , rosuvastatin    Hypertension    Iron  deficiency anemia    Ischemic cardiomyopathy (resolved)    a. 03/2015 EF 30-35% post NSTEMI;  b. 04/2015 EF 55-65% on LV gram; c. 09/2017 Echo: EF 55-60%; d. 11/2018 Echo: Ef 60-65%; e. 06/2022 Echo: EF 60-65%.   Obesity    Tobacco abuse    a. quit 03/2015.   Uterine fibroid    Vertigo    Vitamin D  deficiency        Past Surgical History:  Procedure Laterality Date   APPENDECTOMY     CARDIAC CATHETERIZATION  03/2015   x2 stent [facility]   CARDIAC CATHETERIZATION N/A 05/05/2015   Procedure: Left Heart Cath and Coronary Angiography;  Surgeon: Peter M Swaziland, MD;  Location[facility] INVASIVE CV LAB;  Service: Cardiovascular;  Laterality: N/A;   CATARACT EXTRACTION Left 09/12/2021   CATARACT EXTRACTION Right 10/10/2021   COLONOSCOPY WITH PROPOFOL   N/A 07/31/2016   Procedure: COLONOSCOPY WITH PROPOFOL ;  Surgeon: Deveron Fly, MD;  Location: [facility] ENDOSCOPY;  Service: Endoscopy;  Laterality: N/A;   CORONARY BALLOON ANGIOPLASTY N/A 12/04/2018   Procedure: CORONARY BALLOON ANGIOPLASTY;  Surgeon: Wenona Hamilton, MD;  Location: [facility] INVASIVE CV LAB;  Service: Cardiovascular;  Laterality: N/A;   CORONARY PRESSURE/FFR STUDY N/A 12/04/2018   Procedure: INTRAVASCULAR PRESSURE WIRE/FFR STUDY;  Surgeon: Wenona Hamilton, MD;  Location: [facility] INVASIVE CV LAB;  Service: Cardiovascular;  Laterality: N/A;   CORONARY STENT INTERVENTION N/A 01/12/2019   Procedure: CORONARY STENT INTERVENTION;  Surgeon: Sammy Crisp, MD;  Location: [facility] INVASIVE CV  LAB;  Service: Cardiovascular;  Laterality: N/A;   ESOPHAGOGASTRODUODENOSCOPY (EGD) WITH PROPOFOL  N/A 07/31/2016   Procedure: ESOPHAGOGASTRODUODENOSCOPY (EGD) WITH PROPOFOL ;  Surgeon: Deveron Fly, MD;  Location: [facility] ENDOSCOPY;  Service: Endoscopy;  Laterality: N/A;   EYE SURGERY  07/16/2022   Membrane peeled   LAPAROSCOPIC APPENDECTOMY N/A 08/07/2016   Procedure: APPENDECTOMY LAPAROSCOPIC;  Surgeon: Kandis Ormond, MD;  Location: [facility] ORS;  Service: General;  Laterality: N/A;   LEFT HEART CATH AND CORONARY ANGIOGRAPHY Left 12/04/2018   Procedure: LEFT HEART CATH AND CORONARY ANGIOGRAPHY;  Surgeon: Wenona Hamilton, MD;  Location: [facility] INVASIVE CV LAB;  Service: Cardiovascular;  Laterality: Left;   LEFT HEART CATH AND CORONARY ANGIOGRAPHY N/A 01/12/2019   Procedure: LEFT HEART CATH AND CORONARY ANGIOGRAPHY;  Surgeon: Sammy Crisp, MD;  Location: [facility] INVASIVE CV LAB;  Service: Cardiovascular;  Laterality: N/A;   MOUTH SURGERY     RIGHT/LEFT HEART CATH AND CORONARY ANGIOGRAPHY N/A 01/29/2021   Procedure: RIGHT/LEFT HEART CATH AND CORONARY ANGIOGRAPHY;  Surgeon: Wenona Hamilton, MD;  Location: [facility] INVASIVE CV LAB;  Service: Cardiovascular;  Laterality: N/A;   VITRECTOMY Left 10/30/2021    Allergies  Allergen Reactions   Lisinopril  Anaphylaxis, Itching and Swelling   Other Itching and Swelling    [place] seasoning    Rosemary Oil Anaphylaxis   Shellfish Allergy  Itching and Swelling    Pt states that she HAS had IV Contrast without any reaction   Statins Other (See Comments)    Myalgias   Metformin  And Related Diarrhea, Nausea And Vomiting and Rash    Current Outpatient Medications  Medication Sig Dispense Refill   acetaminophen  (TYLENOL ) 500 MG tablet Take 1,000 mg by mouth every 4 (four) hours as needed.     Alirocumab  (PRALUENT ) 150 MG/ML SOAJ Inject 1 mL (150 mg total) into the skin every 14 (fourteen) days. 6 mL 3   allopurinol  (ZYLOPRIM ) 100 MG tablet Take 1/2  (one-half) tablet by mouth once daily 45 tablet 4   aspirin  EC 81 MG tablet Take 81 mg by mouth daily.     azelastine  (ASTELIN ) 0.1 % nasal spray Place 1 spray into both nostrils 2 (two) times daily as needed. Use in each nostril as directed 30 mL 5   BD PEN NEEDLE MICRO U/F 32G X 6 MM MISC Inject into the skin.     benzonatate  (TESSALON ) 100 MG capsule Take 1 capsule (100 mg total) by mouth 2 (two) times daily as needed for cough. 60 capsule 3   carvedilol  (COREG ) 12.5 MG tablet Take one tablet in the morning and half a tablet in the evening 45 tablet 5   cetirizine  (ZYRTEC  ALLERGY ) 10 MG tablet Take 1 tablet (10 mg total) by mouth daily. 30 tablet 5   Cholecalciferol  1.25 MG (50000 UT) TABS Take 1 tablet by mouth once a  week. 12 tablet 4   clopidogrel  (PLAVIX ) 75 MG tablet Take 1 tablet (75 mg total) by mouth daily. 90 tablet 0   Continuous Blood Gluc Sensor (DEXCOM G6 SENSOR) MISC See admin instructions.     doxycycline  (VIBRA -TABS) 100 MG tablet Take 1 tablet (100 mg total) by mouth 2 (two) times daily for 7 days. 14 tablet 0   EPINEPHrine  0.3 mg/0.3 mL IJ SOAJ injection Inject 0.3 mg into the muscle as needed for anaphylaxis. 2 each 1   etonogestrel  (NEXPLANON ) 68 MG IMPL implant 1 each (68 mg total) by Subdermal route once for 1 dose. 1 each 0   ezetimibe  (ZETIA ) 10 MG tablet Take 1 tablet by mouth once daily 90 tablet 3   famotidine  (PEPCID ) 40 MG tablet Take 1 tablet (40 mg total) by mouth daily. 90 tablet 4   furosemide  (LASIX ) 20 MG tablet Take 1 tablet (20 mg total) by mouth daily. 30 tablet 6   insulin  aspart (NOVOLOG ) 100 UNIT/ML injection Inject 10 Units into the skin 3 (three) times daily with meals. 10 mL 11   insulin  glargine-yfgn (SEMGLEE ) 100 UNIT/ML Pen Inject 110 Units into the skin at bedtime.     irbesartan  (AVAPRO ) 75 MG tablet Take 1 tablet (75 mg total) by mouth daily. 90 tablet 1   isosorbide  mononitrate (IMDUR ) 120 MG 24 hr tablet Take 1 tablet (120 mg total) by mouth  daily. 90 tablet 3   LOKELMA 10 g PACK packet Take 10 g by mouth daily.     mupirocin  ointment (BACTROBAN ) 2 % Apply 1 Application topically 2 (two) times daily. Under axilla area. 22 g 0   nitroGLYCERIN  (NITROSTAT ) 0.4 MG SL tablet Take 1 tab under your tongue while sitting for chest pain, If no relief may repeat, one tab every 5 min up to 3 tabs total over 15 mins. 25 tablet 3   Olopatadine  HCl 0.2 % SOLN Apply 1 drop to eye daily as needed (itchy watery eyes). 2.5 mL 5   pantoprazole  (PROTONIX ) 40 MG tablet Take 1 tablet by mouth twice daily 180 tablet 0   promethazine  (PHENERGAN ) 25 MG tablet Take 25 mg by mouth every 6 (six) hours as needed for nausea or vomiting.     Semaglutide , 1 MG/DOSE, (OZEMPIC , 1 MG/DOSE,) 2 MG/1.5ML SOPN Inject 1 mg into the skin once a week.     XHANCE  93 MCG/ACT EXHU Place 1 spray into both nostrils 2 (two) times daily.     No current facility-administered medications for this visit.   Facility-Administered Medications Ordered in Other Visits  Medication Dose Route Frequency Provider Last Rate Last Admin   sodium chloride  flush (NS) 0.9 % injection 3 mL  3 mL Intravenous Q12H Visser, Jacquelyn D, PA-C        Family History Family History  Problem Relation Age of Onset   Diabetes Mother    Heart disease Father    Diabetes Father    Cancer - Cervical Maternal Aunt    Cancer Maternal Grandmother        lung   Cancer Maternal Grandfather        prostate   Diabetes Paternal Grandmother    Diabetes Paternal Grandfather        Social History Social History   Tobacco Use   Smoking status: Former    Current packs/day: 0.00    Average packs/day: 0.3 packs/day for 15.0 years (4.5 ttl pk-yrs)    Types: Cigarettes    Start date: 03/09/2000  Quit date: 03/10/2015    Years since quitting: 9.1    Passive exposure: Current   Smokeless tobacco: Never  Vaping Use   Vaping status: Never Used  Substance Use Topics   Alcohol use: No    Alcohol/week: 0.0  standard drinks of alcohol   Drug use: Not Currently        ROS Full ROS of systems performed and is otherwise negative there than what is stated in the HPI  Physical Exam Blood pressure 127/81, pulse 85, temperature 98.5 F (36.9 C), temperature source Oral, height 5' 5.5" (1.664 m), weight 270 lb (122.5 kg), SpO2 98%.  Alert and oriented x 3, normal work of breathing, nontender nondistended, right axilla there is no palpable lymphadenopathy.  There is some prominence in the subcutaneous tissue but I do not feel a distinct mass or cystic structure.  Deep into the axilla I do feel some adenopathy.  There is no pitting or scarring that would make me extremely concerned for hidradenitis  Data Reviewed I reviewed her primary care notes.  She was prescribed doxycycline  for the mass.  Last hemoglobin A1c was 8.6 which was done 2 months ago.  I have personally reviewed the patient's imaging and medical records.    Assessment/PLan    Patient with right axillary drainage.  On exam it does not seem like a bad case of hidradenitis suppurativa and there is no skin level cyst that I think would be amenable to excision.  I do not think there is something that could be gone after to excise and there is certainly no abscess at this time.  I discussed with the patient that if she starts to develop increased draining she should return to our office and I can examine it at that time.  Until then she should continue doxycycline  and if there is more consistent evidence for at bedtime she may need to see a dermatologist.  She can follow-up with us  as needed.    Barrett Lick 05/14/2024, 10:10 AM

## 2024-05-19 DIAGNOSIS — R801 Persistent proteinuria, unspecified: Secondary | ICD-10-CM | POA: Diagnosis not present

## 2024-05-19 DIAGNOSIS — I10 Essential (primary) hypertension: Secondary | ICD-10-CM | POA: Diagnosis not present

## 2024-05-19 DIAGNOSIS — N2581 Secondary hyperparathyroidism of renal origin: Secondary | ICD-10-CM | POA: Diagnosis not present

## 2024-05-19 DIAGNOSIS — E790 Hyperuricemia without signs of inflammatory arthritis and tophaceous disease: Secondary | ICD-10-CM | POA: Diagnosis not present

## 2024-05-19 DIAGNOSIS — E1122 Type 2 diabetes mellitus with diabetic chronic kidney disease: Secondary | ICD-10-CM | POA: Diagnosis not present

## 2024-05-19 DIAGNOSIS — N184 Chronic kidney disease, stage 4 (severe): Secondary | ICD-10-CM | POA: Diagnosis not present

## 2024-05-22 ENCOUNTER — Other Ambulatory Visit: Payer: Self-pay | Admitting: Cardiovascular Disease

## 2024-06-06 ENCOUNTER — Other Ambulatory Visit: Payer: Self-pay | Admitting: Nurse Practitioner

## 2024-06-08 NOTE — Telephone Encounter (Signed)
 Requested medication (s) are due for refill today: no  Requested medication (s) are on the active medication list: yes  Last refill:  12/20/23 #12 4 RF  Future visit scheduled: yes  Notes to clinic:   NT not delegated to refuse this med   Requested Prescriptions  Pending Prescriptions Disp Refills   Cholecalciferol  (VITAMIN D3) 1.25 MG (50000 UT) CAPS [Pharmacy Med Name: Vitamin D3 1.25 MG (50000 UT) Oral Capsule] 12 capsule 0    Sig: Take 1 capsule by mouth once a week     Endocrinology:  Vitamins - Vitamin D  Supplementation 2 Failed - 06/08/2024 11:48 AM      Failed - Manual Review: Route requests for 50,000 IU strength to the provider      Failed - Vitamin D  in normal range and within 360 days    Vit D, 25-Hydroxy  Date Value Ref Range Status  12/19/2023 10.6 (L) 30.0 - 100.0 ng/mL Final    Comment:    Vitamin D  deficiency has been defined by the Institute of Medicine and an Endocrine Society practice guideline as a level of serum 25-OH vitamin D  less than 20 ng/mL (1,2). The Endocrine Society went on to further define vitamin D  insufficiency as a level between 21 and 29 ng/mL (2). 1. IOM (Institute of Medicine). 2010. Dietary reference    intakes for calcium  and D. [place]: The    [employer]. 2. Holick MF, Binkley Dell, Bischoff-Ferrari HA, et al.    Evaluation, treatment, and prevention of vitamin D     deficiency: an Endocrine Society clinical practice    guideline. JCEM. 2011 Jul; 96(7):1911-30.          Failed - Valid encounter within last 12 months    Recent Outpatient Visits           1 month ago Sebaceous cyst of right axilla   [facility] [place], Melanie T, NP       Future Appointments             In 1 week [place], Melanie DASEN, NP  [facility]            Passed - Ca in normal range and within 360 days    Calcium   Date Value Ref Range Status  12/19/2023 9.2 8.7 - 10.2 mg/dL Final    Calcium , Total  Date Value Ref Range Status  03/31/2015 7.7 (L) mg/dL Final    Comment:    1.0-89.6 NOTE: New Reference Range  02/14/15

## 2024-06-09 ENCOUNTER — Other Ambulatory Visit: Payer: Self-pay | Admitting: Nurse Practitioner

## 2024-06-11 NOTE — Telephone Encounter (Signed)
 Requested medications are due for refill today.  unsure  Requested medications are on the active medications list.  no  Last refill. 05/07/2024  Future visit scheduled.   yes  Notes to clinic.  Refill not delegated.    Requested Prescriptions  Pending Prescriptions Disp Refills   doxycycline  (VIBRA -TABS) 100 MG tablet [Pharmacy Med Name: Doxycycline  Hyclate 100 MG Oral Tablet] 14 tablet 0    Sig: Take 1 tablet by mouth twice daily for 7 days     Off-Protocol Failed - 06/11/2024  6:13 PM      Failed - Medication not assigned to a protocol, review manually.      Passed - Valid encounter within last 12 months    Recent Outpatient Visits           1 month ago Sebaceous cyst of right axilla   [facility] [place], Melanie DASEN, NP       Future Appointments             In 1 week Cannady, Melanie DASEN, NP [place] [facility]

## 2024-06-12 NOTE — Patient Instructions (Signed)

## 2024-06-13 ENCOUNTER — Encounter: Payer: Self-pay | Admitting: Nurse Practitioner

## 2024-06-14 MED ORDER — VITAMIN D3 1.25 MG (50000 UT) PO CAPS: 1.0000 | ORAL_CAPSULE | ORAL | 3 refills | Status: AC

## 2024-06-18 ENCOUNTER — Ambulatory Visit (INDEPENDENT_AMBULATORY_CARE_PROVIDER_SITE_OTHER): Payer: Self-pay | Admitting: Nurse Practitioner

## 2024-06-18 ENCOUNTER — Encounter: Payer: Self-pay | Admitting: Nurse Practitioner

## 2024-06-18 VITALS — BP 112/71 | HR 80 | Temp 98.0°F | Ht 64.8 in | Wt 263.2 lb

## 2024-06-18 DIAGNOSIS — N2581 Secondary hyperparathyroidism of renal origin: Secondary | ICD-10-CM | POA: Diagnosis not present

## 2024-06-18 DIAGNOSIS — E1143 Type 2 diabetes mellitus with diabetic autonomic (poly)neuropathy: Secondary | ICD-10-CM | POA: Diagnosis not present

## 2024-06-18 DIAGNOSIS — N184 Chronic kidney disease, stage 4 (severe): Secondary | ICD-10-CM | POA: Diagnosis not present

## 2024-06-18 DIAGNOSIS — Z1231 Encounter for screening mammogram for malignant neoplasm of breast: Secondary | ICD-10-CM

## 2024-06-18 DIAGNOSIS — E1122 Type 2 diabetes mellitus with diabetic chronic kidney disease: Secondary | ICD-10-CM

## 2024-06-18 DIAGNOSIS — E1169 Type 2 diabetes mellitus with other specified complication: Secondary | ICD-10-CM

## 2024-06-18 DIAGNOSIS — E1129 Type 2 diabetes mellitus with other diabetic kidney complication: Secondary | ICD-10-CM

## 2024-06-18 DIAGNOSIS — E66813 Obesity, class 3: Secondary | ICD-10-CM | POA: Diagnosis not present

## 2024-06-18 DIAGNOSIS — I5032 Chronic diastolic (congestive) heart failure: Secondary | ICD-10-CM | POA: Diagnosis not present

## 2024-06-18 DIAGNOSIS — R8281 Pyuria: Secondary | ICD-10-CM

## 2024-06-18 DIAGNOSIS — Z794 Long term (current) use of insulin: Secondary | ICD-10-CM

## 2024-06-18 DIAGNOSIS — I130 Hypertensive heart and chronic kidney disease with heart failure and stage 1 through stage 4 chronic kidney disease, or unspecified chronic kidney disease: Secondary | ICD-10-CM

## 2024-06-18 DIAGNOSIS — R809 Proteinuria, unspecified: Secondary | ICD-10-CM

## 2024-06-18 DIAGNOSIS — I25118 Atherosclerotic heart disease of native coronary artery with other forms of angina pectoris: Secondary | ICD-10-CM | POA: Diagnosis not present

## 2024-06-18 DIAGNOSIS — N183 Chronic kidney disease, stage 3 unspecified: Secondary | ICD-10-CM

## 2024-06-18 DIAGNOSIS — E785 Hyperlipidemia, unspecified: Secondary | ICD-10-CM | POA: Diagnosis not present

## 2024-06-18 LAB — URINALYSIS, ROUTINE W REFLEX MICROSCOPIC
Bilirubin, UA: NEGATIVE
Glucose, UA: NEGATIVE
Ketones, UA: NEGATIVE
Nitrite, UA: NEGATIVE
Specific Gravity, UA: 1.015 (ref 1.005–1.030)
Urobilinogen, Ur: 0.2 mg/dL (ref 0.2–1.0)
pH, UA: 6 (ref 5.0–7.5)

## 2024-06-18 LAB — MICROSCOPIC EXAMINATION: Bacteria, UA: NONE SEEN

## 2024-06-18 MED ORDER — TIZANIDINE HCL 2 MG PO TABS: 2.0000 mg | ORAL_TABLET | Freq: Three times a day (TID) | ORAL | 0 refills | Status: AC | PRN

## 2024-06-18 MED ORDER — ALLOPURINOL 100 MG PO TABS: 50.0000 mg | ORAL_TABLET | Freq: Every day | ORAL | 4 refills | Status: AC

## 2024-06-18 MED ORDER — TRIAMCINOLONE ACETONIDE 0.1 % EX CREA: 1.0000 | TOPICAL_CREAM | Freq: Two times a day (BID) | CUTANEOUS | 0 refills | Status: DC

## 2024-06-18 NOTE — Assessment & Plan Note (Signed)
Ongoing, continue collaboration with endocrinology. 

## 2024-06-18 NOTE — Assessment & Plan Note (Signed)
Chronic, ongoing with poor tolerance to statins.  Followed by cardiology.  Continue Praluent and Zetia, as is intolerant to statins.  Lipid panel today.

## 2024-06-18 NOTE — Assessment & Plan Note (Signed)
 Chronic, ongoing, followed by nephrology.  Recent notes and labs reviewed.  Continue current medications as prescribed by them.  Urine ALB 150 January 2025.  A1c 8.6% recently with endocrinology.  Continue collaboration with nephrology and endocrinology.  UA today noting + BLD, PRT, and LEUKS.  Will send for culture.  Obtain renal ultrasound to check for stones based on her symptoms and Tizanidine  sent in for pain if present. - Cholesterol lowering medication and ARB on board - Eye and foot exam up to date - Vaccinations up to date

## 2024-06-18 NOTE — Assessment & Plan Note (Signed)
Chronic, ongoing, followed by cardiology.  Euvolemic today.  Continue current medication regimen as prescribed by them.  Recommend: - Reminded to call for an overnight weight gain of >2 pounds or a weekly weight gain of >5 pounds - not adding salt to food and read food labels. Reviewed the importance of keeping daily sodium intake to <2000mg daily - Avoid all NSAID products 

## 2024-06-18 NOTE — Assessment & Plan Note (Signed)
Chronic, stable, followed by cardiology.  Continue this collaboration and medication regimen as prescribed by them. 

## 2024-06-18 NOTE — Assessment & Plan Note (Signed)
 Chronic, stable.  BP at goal in office today.  Recommend she monitor BP at least a few mornings a week at home and document.  DASH diet at home.  Continue current medication regimen and adjust as needed -- continue collaboration with cardiology.  Labs today: up to date.  Urine ALB 150 January 2025.

## 2024-06-18 NOTE — Assessment & Plan Note (Signed)
 Chronic, ongoing, followed by nephrology.  Recent notes and labs reviewed.  Continue current medications as prescribed by them.Urine ALB 150 January 2025.  A1c 8.6% recently with endocrinology.  Continue collaboration with nephrology and endocrinology. - Cholesterol lowering medication and ARB on board - Eye and foot exam up to date - Vaccinations up to date

## 2024-06-18 NOTE — Assessment & Plan Note (Signed)
 BMI 44.07 with T2DM, HF, CKD.  Recommended eating smaller high protein, low fat meals more frequently and exercising 30 mins a day 5 times a week with a goal of 10-15lb weight loss in the next 3 months. Patient voiced their understanding and motivation to adhere to these recommendations.

## 2024-06-18 NOTE — Assessment & Plan Note (Signed)
Chronic, ongoing, followed by nephrology.  Continue this collaboration - recent note and labs reviewed. 

## 2024-06-18 NOTE — Progress Notes (Signed)
 BP 112/71   Pulse 80   Temp 98 F (36.7 C) (Oral)   Ht 5' 4.8 (1.646 m)   Wt 263 lb 3.2 oz (119.4 kg)   SpO2 98%   BMI 44.07 kg/m    Subjective:    Patient ID: [name] [name], female    DOB: August 18, 1981, 43 y.o.   MRN: [id number]  HPI: [name] [name] is a 43 y.o. female  Chief Complaint  Patient presents with   Chronic Kidney Disease   Diabetes   Hyperlipidemia   Hypertension   DIABETES WITH GASTROPARESIS April A1c 8.6%, had been out of insulin  on and off for a month due to [facility] not filling.  Saw endocrinology on 04/08/24, returns on 07/12/24.  Taking Tresiba  110 units, sliding scale Novolog  (has not been having to take as is adjusting carb intake), Ozempic  1 MG Hypoglycemic episodes:no Polydipsia/polyuria: no Visual disturbance: no Chest pain: no Paresthesias: no Glucose Monitoring: yes  Accucheck frequency: BID - varies using Dexcom, this morning was low  Fasting glucose:   Post prandial:  Evening: varies  Before meals: Taking Insulin ?: yes  Long acting insulin : 110 Tresiba   Short acting insulin : sliding scale Blood Pressure Monitoring: daily Retinal Examination: Up to Date -- [facility] Exam: Up to Date Pneumovax: Up to Date Influenza: Up to Date Aspirin : yes   HYPERTENSION / HYPERLIPIDEMIA/HF/AORTIC ATHEROSCLEROSIS Echo in July 2023 showed EF 60-65%. CPAP used, has not gotten new tubing as of yet.  Last saw cardiology 03/04/24.  Emily Coleridge for history of elevations. Takes Praluent  for  Satisfied with current treatment? yes Duration of hypertension: chronic BP monitoring frequency: daily BP range: 120-130/80 range, but varies to too high or too low BP medication side effects: no Duration of hyperlipidemia: chronic Cholesterol supplements: none Medication compliance: good compliance Aspirin : yes Recent stressors: no Recurrent headaches: no Visual changes: no Palpitations: no Dyspnea: no Chest pain: no Lower extremity edema:  occasional Dizzy/lightheaded: no  CHRONIC KIDNEY DISEASE Nephrology last on 05/19/24 with CRT 2.53, eGFR 23, PTH 199. Feels like right kidney is always hurting, very painful.  When it happens she feels like she has the flu and gets rash around her neck.  Will get a lot if infections after this, sinus and UTI.   CKD status: stable Medications renally dose: yes Previous renal evaluation: yes Pneumovax:  Up to Date Influenza Vaccine:  Up to Date      06/18/2024    8:46 AM 05/11/2024    1:21 PM 05/07/2024    3:41 PM 12/19/2023    8:31 AM 11/10/2023    4:11 PM  Depression screen PHQ 2/9  Decreased Interest 0 0 0 0 0  Down, Depressed, Hopeless 0 0 0 0 0  PHQ - 2 Score 0 0 0 0 0  Altered sleeping 2 2 2 2  0  Tired, decreased energy 2 2 2 2 1   Change in appetite 2 2 2  0 0  Feeling bad or failure about yourself  0 0 0 0 0  Trouble concentrating 0 0 0 0 0  Moving slowly or fidgety/restless 0 0 0 0 0  Suicidal thoughts 0 0 0 0 0  PHQ-9 Score 6 6 6 4 1   Difficult doing work/chores Somewhat difficult Somewhat difficult Somewhat difficult Somewhat difficult Not difficult at all       06/18/2024    8:47 AM 05/07/2024    3:42 PM 12/19/2023    8:32 AM 11/10/2023    4:11 PM  GAD 7 :  Generalized Anxiety Score  Nervous, Anxious, on Edge 0 0 0 0  Control/stop worrying 0 0 0 0  Worry too much - different things 0 0 0 0  Trouble relaxing 0 0 0 0  Restless 2 2 2  0  Easily annoyed or irritable 0 0 0 0  Afraid - awful might happen 0 0 0 0  Total GAD 7 Score 2 2 2  0  Anxiety Difficulty Somewhat difficult Not difficult at all Somewhat difficult Not difficult at all     Relevant past medical, surgical, family and social history reviewed and updated as indicated. Interim medical history since our last visit reviewed. Allergies and medications reviewed and updated.  Review of Systems  Constitutional:  Negative for activity change, appetite change, diaphoresis, fatigue and fever.  Respiratory:  Negative  for cough, chest tightness, shortness of breath and wheezing.   Cardiovascular:  Negative for chest pain, palpitations and leg swelling.  Gastrointestinal: Negative.   Endocrine: Negative for cold intolerance, heat intolerance, polydipsia, polyphagia and polyuria.  Neurological: Negative.   Psychiatric/Behavioral: Negative.      Per HPI unless specifically indicated above     Objective:    BP 112/71   Pulse 80   Temp 98 F (36.7 C) (Oral)   Ht 5' 4.8 (1.646 m)   Wt 263 lb 3.2 oz (119.4 kg)   SpO2 98%   BMI 44.07 kg/m   Wt Readings from Last 3 Encounters:  06/18/24 263 lb 3.2 oz (119.4 kg)  05/13/24 270 lb (122.5 kg)  05/11/24 266 lb (120.7 kg)    Physical Exam Vitals and nursing note reviewed.  Constitutional:      General: She is awake. She is not in acute distress.    Appearance: She is well-developed and well-groomed. She is obese. She is not ill-appearing or toxic-appearing.  HENT:     Head: Normocephalic.     Right Ear: Hearing and external ear normal.     Left Ear: Hearing and external ear normal.  Eyes:     General: Lids are normal.        Right eye: No discharge.        Left eye: No discharge.     Conjunctiva/sclera: Conjunctivae normal.     Pupils: Pupils are equal, round, and reactive to light.  Neck:     Thyroid : No thyromegaly.     Vascular: No carotid bruit.  Cardiovascular:     Rate and Rhythm: Normal rate and regular rhythm.     Heart sounds: Normal heart sounds. No murmur heard.    No gallop.  Pulmonary:     Effort: Pulmonary effort is normal. No accessory muscle usage or respiratory distress.     Breath sounds: Normal breath sounds.  Abdominal:     General: Bowel sounds are normal. There is no distension.     Palpations: Abdomen is soft.     Tenderness: There is no abdominal tenderness. There is right CVA tenderness. There is no left CVA tenderness.  Musculoskeletal:     Cervical back: Normal range of motion and neck supple.     Right lower  leg: No edema.     Left lower leg: No edema.  Lymphadenopathy:     Cervical: No cervical adenopathy.  Skin:    General: Skin is warm and dry.  Neurological:     Mental Status: She is alert and oriented to person, place, and time.     Deep Tendon Reflexes: Reflexes are normal and symmetric.  Reflex Scores:      Brachioradialis reflexes are 2+ on the right side and 2+ on the left side.      Patellar reflexes are 2+ on the right side and 2+ on the left side. Psychiatric:        Attention and Perception: Attention normal.        Mood and Affect: Mood normal.        Speech: Speech normal.        Behavior: Behavior normal. Behavior is cooperative.        Thought Content: Thought content normal.    Results for orders placed or performed in visit on 06/18/24  Microscopic Examination   Collection Time: 06/18/24  9:17 AM   Urine  Result Value Ref Range   WBC, UA 0-5 0 - 5 /hpf   RBC, Urine 0-2 0 - 2 /hpf   Epithelial Cells (non renal) 0-10 0 - 10 /hpf   Bacteria, UA None seen None seen/Few   Yeast, UA Present (A) None seen  Urinalysis, Routine w reflex microscopic   Collection Time: 06/18/24  9:17 AM  Result Value Ref Range   Specific Gravity, UA 1.015 1.005 - 1.030   pH, UA 6.0 5.0 - 7.5   Color, UA Yellow Yellow   Appearance Ur Clear Clear   Leukocytes,UA 1+ (A) Negative   Protein,UA 1+ (A) Negative/Trace   Glucose, UA Negative Negative   Ketones, UA Negative Negative   RBC, UA Trace (A) Negative   Bilirubin, UA Negative Negative   Urobilinogen, Ur 0.2 0.2 - 1.0 mg/dL   Nitrite, UA Negative Negative   Microscopic Examination See below:       Assessment & Plan:   Problem List Items Addressed This Visit       Cardiovascular and Mediastinum   Chronic diastolic heart failure (HCC) (Chronic)   Chronic, ongoing, followed by cardiology.  Euvolemic today.  Continue current medication regimen as prescribed by them.  Recommend: - Reminded to call for an overnight weight gain  of >2 pounds or a weekly weight gain of >5 pounds - not adding salt to food and read food labels. 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  BP at goal in office today.  Recommend she monitor BP at least a few mornings a week at home and document.  DASH diet at home.  Continue current medication regimen and adjust as needed -- continue collaboration with cardiology.  Labs today: up to date.  Urine ALB 150 January 2025.       Coronary artery disease of native artery of native heart with stable angina pectoris (HCC)   Chronic, stable, followed by cardiology.  Continue this collaboration and medication regimen as prescribed by them.      Relevant Medications   tiZANidine  (ZANAFLEX ) 2 MG tablet     Digestive   Diabetic gastroparesis associated with type 2 diabetes mellitus (HCC) - Primary   Chronic, ongoing, followed by nephrology.  Recent notes and labs reviewed.  Continue current medications as prescribed by them.Urine ALB 150 January 2025.  A1c 8.6% recently with endocrinology.  Continue collaboration with nephrology and endocrinology. - Cholesterol lowering medication and ARB on board - Eye and foot exam up to date - Vaccinations up to date       Relevant Medications  insulin  degludec (TRESIBA ) 200 UNIT/ML FlexTouch Pen     Endocrine   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)   Chronic, ongoing, followed by nephrology.  Recent notes and labs reviewed.  Continue current medications as prescribed by them.Urine ALB 150 January 2025.  A1c 8.6% recently with endocrinology.  Continue collaboration with nephrology and endocrinology. - Cholesterol lowering medication and ARB on board - Eye and foot exam up to date - Vaccinations up to date      Relevant Medications   insulin  degludec (TRESIBA ) 200  UNIT/ML FlexTouch Pen   Hyperparathyroidism due to renal insufficiency (HCC)   Chronic, ongoing, followed by nephrology.  Continue this collaboration - recent note and labs reviewed.      Hyperlipidemia associated with type 2 diabetes mellitus (HCC)   Chronic, ongoing with poor tolerance to statins.  Followed by cardiology.  Continue Praluent  and Zetia , as is intolerant to statins.  Lipid panel today.      Relevant Medications   insulin  degludec (TRESIBA ) 200 UNIT/ML FlexTouch Pen   Other Relevant Orders   Lipid Panel w/o Chol/HDL Ratio   CKD stage 4 due to type 2 diabetes mellitus (HCC)   Chronic, ongoing, followed by nephrology.  Recent notes and labs reviewed.  Continue current medications as prescribed by them.  Urine ALB 150 January 2025.  A1c 8.6% recently with endocrinology.  Continue collaboration with nephrology and endocrinology.  UA today noting + BLD, PRT, and LEUKS.  Will send for culture.  Obtain renal ultrasound to check for stones based on her symptoms and Tizanidine  sent in for pain if present. - Cholesterol lowering medication and ARB on board - Eye and foot exam up to date - Vaccinations up to date      Relevant Medications   insulin  degludec (TRESIBA ) 200 UNIT/ML FlexTouch Pen   Other Relevant Orders   US  Renal   Urinalysis, Routine w reflex microscopic (Completed)     Other   Obesity   BMI 44.07 with T2DM, HF, CKD.  Recommended eating smaller high protein, low fat meals more frequently and exercising 30 mins a day 5 times a week with a goal of 10-15lb weight loss in the next 3 months. Patient voiced their understanding and motivation to adhere to these recommendations.       Relevant Medications   insulin  degludec (TRESIBA ) 200 UNIT/ML FlexTouch Pen   Long term (current) use of insulin  (HCC)   Ongoing, continue collaboration with endocrinology.      Other Visit Diagnoses       Pyuria       Urine sent for culture   Relevant Orders   Urine Culture      Encounter for screening mammogram for malignant neoplasm of breast       Mammogram ordered.   Relevant Orders   MM 3D SCREENING MAMMOGRAM BILATERAL BREAST        Follow up plan: Return in about 6 months (around 12/19/2024) for T2DM, HTN/HLD, CKD.

## 2024-06-19 ENCOUNTER — Ambulatory Visit: Payer: Self-pay | Admitting: Nurse Practitioner

## 2024-06-19 LAB — LIPID PANEL W/O CHOL/HDL RATIO
Cholesterol, Total: 139 mg/dL (ref 100–199)
HDL: 36 mg/dL — ABNORMAL LOW (ref >39)
LDL Chol Calc (NIH): 86 mg/dL (ref 0–99)
Triglycerides: 91 mg/dL (ref 0–149)
VLDL Cholesterol Cal: 17 mg/dL (ref 5–40)

## 2024-06-19 NOTE — Progress Notes (Signed)
 Contacted via MyChart  Good morning [name] [name], your lipid panel has returned and does sow stable levels, but LDL has trended up.  Ensure you are taking all cholesterol lowering medications as ordered.  Urine did show a trace of blood and leukocytes.  I am sending for culture to check for infection.  Any questions? Keep being amazing!!  Thank you for allowing me to participate in your care.  I appreciate you. Kindest regards, Eliyah Bazzi

## 2024-06-20 LAB — URINE CULTURE

## 2024-06-21 ENCOUNTER — Encounter: Payer: Self-pay | Admitting: Nurse Practitioner

## 2024-06-23 ENCOUNTER — Ambulatory Visit
Admission: RE | Admit: 2024-06-23 | Discharge: 2024-06-23 | Disposition: A | Discharge status: Home / Self Care | Source: Ambulatory Visit | Attending: Nurse Practitioner | Admitting: Nurse Practitioner

## 2024-06-23 ENCOUNTER — Encounter: Payer: Self-pay | Admitting: Internal Medicine

## 2024-06-23 DIAGNOSIS — N23 Unspecified renal colic: Secondary | ICD-10-CM | POA: Diagnosis not present

## 2024-06-23 DIAGNOSIS — N184 Chronic kidney disease, stage 4 (severe): Secondary | ICD-10-CM | POA: Diagnosis not present

## 2024-06-23 DIAGNOSIS — E1122 Type 2 diabetes mellitus with diabetic chronic kidney disease: Secondary | ICD-10-CM | POA: Insufficient documentation

## 2024-06-30 ENCOUNTER — Telehealth: Payer: Self-pay | Admitting: Cardiovascular Disease

## 2024-06-30 NOTE — Telephone Encounter (Signed)
 S/w dental office and confirmed procedure: 5 fillings and 1 crown  DR. DYANNA, DDS

## 2024-06-30 NOTE — Telephone Encounter (Signed)
   Pre-operative Risk Assessment    Patient Name: [name] [name]  DOB: [birth year] MRN: [id number]   Date of last office visit: 03/04/24 Date of next office visit: n/a   Request for Surgical Clearance    Procedure:  Filling and crowns  Date of Surgery:  Clearance TBD                                Surgeon:  not indicated Surgeon's Group or Practice Name:  [facility] Phone number:  [phone] Fax number:  [phone]   Type of Clearance Requested:   - Medical    Type of Anesthesia:  Local    Additional requests/questions:    SignedTinnie NOVAK Schools   06/30/2024, 11:21 AM

## 2024-06-30 NOTE — Telephone Encounter (Signed)
   Name: [name] [name]  DOB: 02/01/1981  MRN: [id number]  Primary Cardiologist: Dr.Arida   Preoperative team, please contact this patient and set up a phone call appointment for further preoperative risk assessment. Please obtain consent and complete medication review. Thank you for your help.  I confirm that guidance regarding antiplatelet and oral anticoagulation therapy has been completed and, if necessary, noted below.  Per office protocol, if patient is without any new symptoms or concerns at the time of their virtual visit,she may hold Plavix  for 5 days and ASA for 7 days prior to procedure. Please resume ASA and Plavix  as soon as possible postprocedure, at the discretion of the surgeon.    She does not need SBE prophylaxis.  I also confirmed the patient resides in the state of [place] . As per [facility] Medical Board telemedicine laws, the patient must reside in the state in which the provider is licensed.   Lamarr Satterfield, NP 06/30/2024, 12:30 PM [facility]

## 2024-06-30 NOTE — Telephone Encounter (Signed)
 Please confirm how many fillings and crowns are required.

## 2024-06-30 NOTE — Telephone Encounter (Signed)
 Left message to call back to schedule tele pre op appt.

## 2024-07-01 NOTE — Telephone Encounter (Signed)
 2nd attempt to reach the pt to schedule tele preop appt.

## 2024-07-06 NOTE — Telephone Encounter (Signed)
 3rd attempt to reach pt was unsuccessful. Will route back to requesting office to make them aware.

## 2024-07-07 ENCOUNTER — Encounter: Payer: Self-pay | Admitting: Internal Medicine

## 2024-07-08 DIAGNOSIS — E1165 Type 2 diabetes mellitus with hyperglycemia: Secondary | ICD-10-CM | POA: Diagnosis not present

## 2024-07-12 ENCOUNTER — Telehealth: Payer: Self-pay | Admitting: Nurse Practitioner

## 2024-07-12 DIAGNOSIS — N184 Chronic kidney disease, stage 4 (severe): Secondary | ICD-10-CM | POA: Diagnosis not present

## 2024-07-12 DIAGNOSIS — R809 Proteinuria, unspecified: Secondary | ICD-10-CM | POA: Diagnosis not present

## 2024-07-12 DIAGNOSIS — E66813 Obesity, class 3: Secondary | ICD-10-CM | POA: Diagnosis not present

## 2024-07-12 DIAGNOSIS — E1129 Type 2 diabetes mellitus with other diabetic kidney complication: Secondary | ICD-10-CM | POA: Diagnosis not present

## 2024-07-12 DIAGNOSIS — E1159 Type 2 diabetes mellitus with other circulatory complications: Secondary | ICD-10-CM | POA: Diagnosis not present

## 2024-07-12 DIAGNOSIS — E11311 Type 2 diabetes mellitus with unspecified diabetic retinopathy with macular edema: Secondary | ICD-10-CM | POA: Diagnosis not present

## 2024-07-12 DIAGNOSIS — Z1331 Encounter for screening for depression: Secondary | ICD-10-CM | POA: Diagnosis not present

## 2024-07-12 DIAGNOSIS — E1143 Type 2 diabetes mellitus with diabetic autonomic (poly)neuropathy: Secondary | ICD-10-CM | POA: Diagnosis not present

## 2024-07-12 DIAGNOSIS — Z794 Long term (current) use of insulin: Secondary | ICD-10-CM | POA: Diagnosis not present

## 2024-07-12 DIAGNOSIS — E1122 Type 2 diabetes mellitus with diabetic chronic kidney disease: Secondary | ICD-10-CM | POA: Diagnosis not present

## 2024-07-12 NOTE — Telephone Encounter (Unsigned)
 Copied from CRM [id number]. Topic: Clinical - Medication Question >> Jul 12, 2024  4:51 PM Nathanel BROCKS wrote: Reason for CRM: *** Ibuprofen ? How much can she have in a 3 day period after  dental procedure.  Reggie [phone]

## 2024-07-23 DIAGNOSIS — E113512 Type 2 diabetes mellitus with proliferative diabetic retinopathy with macular edema, left eye: Secondary | ICD-10-CM | POA: Diagnosis not present

## 2024-07-23 LAB — HM DIABETES EYE EXAM

## 2024-07-24 ENCOUNTER — Other Ambulatory Visit: Payer: Self-pay | Admitting: Nurse Practitioner

## 2024-07-24 ENCOUNTER — Other Ambulatory Visit: Payer: Self-pay | Admitting: Cardiovascular Disease

## 2024-07-26 ENCOUNTER — Encounter: Payer: Self-pay | Admitting: Nurse Practitioner

## 2024-08-02 ENCOUNTER — Other Ambulatory Visit: Payer: Self-pay | Admitting: Nurse Practitioner

## 2024-08-03 NOTE — Telephone Encounter (Signed)
 Requested Prescriptions  Pending Prescriptions Disp Refills   pantoprazole  (PROTONIX ) 40 MG tablet [Pharmacy Med Name: Pantoprazole  Sodium 40 MG Oral Tablet Delayed Release] 180 tablet 0    Sig: Take 1 tablet by mouth twice daily     Gastroenterology: Proton Pump Inhibitors Passed - 08/03/2024  1:47 PM      Passed - Valid encounter within last 12 months    Recent Outpatient Visits           1 month ago Diabetic gastroparesis associated with type 2 diabetes mellitus (HCC)   [facility] [place], Melanie T, NP   2 months ago Sebaceous cyst of right axilla    [facility] [place], Melanie DASEN, NP

## 2024-08-11 ENCOUNTER — Other Ambulatory Visit: Payer: Self-pay | Admitting: Cardiovascular Disease

## 2024-08-14 DIAGNOSIS — E1169 Type 2 diabetes mellitus with other specified complication: Secondary | ICD-10-CM | POA: Diagnosis not present

## 2024-08-14 DIAGNOSIS — E1122 Type 2 diabetes mellitus with diabetic chronic kidney disease: Secondary | ICD-10-CM | POA: Diagnosis not present

## 2024-09-08 DIAGNOSIS — N184 Chronic kidney disease, stage 4 (severe): Secondary | ICD-10-CM | POA: Diagnosis not present

## 2024-09-08 DIAGNOSIS — I10 Essential (primary) hypertension: Secondary | ICD-10-CM | POA: Diagnosis not present

## 2024-09-08 DIAGNOSIS — N2581 Secondary hyperparathyroidism of renal origin: Secondary | ICD-10-CM | POA: Diagnosis not present

## 2024-09-08 DIAGNOSIS — R801 Persistent proteinuria, unspecified: Secondary | ICD-10-CM | POA: Diagnosis not present

## 2024-09-08 DIAGNOSIS — E1122 Type 2 diabetes mellitus with diabetic chronic kidney disease: Secondary | ICD-10-CM | POA: Diagnosis not present

## 2024-09-08 DIAGNOSIS — E875 Hyperkalemia: Secondary | ICD-10-CM | POA: Diagnosis not present

## 2024-09-08 DIAGNOSIS — E790 Hyperuricemia without signs of inflammatory arthritis and tophaceous disease: Secondary | ICD-10-CM | POA: Diagnosis not present

## 2024-09-08 DIAGNOSIS — Z794 Long term (current) use of insulin: Secondary | ICD-10-CM | POA: Diagnosis not present

## 2024-09-13 DIAGNOSIS — E1122 Type 2 diabetes mellitus with diabetic chronic kidney disease: Secondary | ICD-10-CM | POA: Diagnosis not present

## 2024-09-13 DIAGNOSIS — E1169 Type 2 diabetes mellitus with other specified complication: Secondary | ICD-10-CM | POA: Diagnosis not present

## 2024-09-20 ENCOUNTER — Encounter: Payer: Self-pay | Admitting: Internal Medicine

## 2024-10-14 DIAGNOSIS — E1122 Type 2 diabetes mellitus with diabetic chronic kidney disease: Secondary | ICD-10-CM | POA: Diagnosis not present

## 2024-10-14 DIAGNOSIS — E1169 Type 2 diabetes mellitus with other specified complication: Secondary | ICD-10-CM | POA: Diagnosis not present

## 2024-10-21 ENCOUNTER — Encounter: Payer: Self-pay | Admitting: Internal Medicine

## 2024-10-30 ENCOUNTER — Other Ambulatory Visit: Payer: Self-pay | Admitting: Nurse Practitioner

## 2024-11-01 NOTE — Telephone Encounter (Signed)
 Requested Prescriptions  Pending Prescriptions Disp Refills   pantoprazole  (PROTONIX ) 40 MG tablet [Pharmacy Med Name: Pantoprazole  Sodium 40 MG Oral Tablet Delayed Release] 180 tablet 0    Sig: Take 1 tablet by mouth twice daily     Gastroenterology: Proton Pump Inhibitors Passed - 11/01/2024 12:42 PM      Passed - Valid encounter within last 12 months    Recent Outpatient Visits           4 months ago Diabetic gastroparesis associated with type 2 diabetes mellitus (HCC)   [place] [facility] [place], Ballplay T, NP   5 months ago Sebaceous cyst of right axilla   [facility] [place], Melanie DASEN, NP

## 2024-11-08 ENCOUNTER — Encounter: Payer: Self-pay | Admitting: Internal Medicine

## 2024-11-09 ENCOUNTER — Telehealth

## 2024-11-12 ENCOUNTER — Encounter: Payer: Self-pay | Admitting: Nurse Practitioner

## 2024-11-13 DIAGNOSIS — E1169 Type 2 diabetes mellitus with other specified complication: Secondary | ICD-10-CM | POA: Diagnosis not present

## 2024-11-13 DIAGNOSIS — E1122 Type 2 diabetes mellitus with diabetic chronic kidney disease: Secondary | ICD-10-CM | POA: Diagnosis not present

## 2024-11-17 DIAGNOSIS — N898 Other specified noninflammatory disorders of vagina: Secondary | ICD-10-CM | POA: Diagnosis not present

## 2024-11-17 DIAGNOSIS — R300 Dysuria: Secondary | ICD-10-CM | POA: Diagnosis not present

## 2024-11-24 DIAGNOSIS — Z0100 Encounter for examination of eyes and vision without abnormal findings: Secondary | ICD-10-CM | POA: Diagnosis not present

## 2024-11-24 DIAGNOSIS — H524 Presbyopia: Secondary | ICD-10-CM | POA: Diagnosis not present

## 2024-12-16 ENCOUNTER — Encounter: Payer: Self-pay | Admitting: Nurse Practitioner

## 2024-12-18 LAB — HEMOGLOBIN A1C: Hemoglobin-A1c: 7.1

## 2024-12-21 ENCOUNTER — Ambulatory Visit: Admitting: Nurse Practitioner

## 2024-12-21 ENCOUNTER — Ambulatory Visit: Payer: Self-pay | Admitting: Nurse Practitioner

## 2024-12-21 ENCOUNTER — Encounter: Payer: Self-pay | Admitting: Internal Medicine

## 2024-12-21 ENCOUNTER — Encounter: Payer: Self-pay | Admitting: Nurse Practitioner

## 2024-12-21 VITALS — BP 148/84 | HR 83 | Temp 97.9°F | Resp 18 | Ht 64.8 in | Wt 272.2 lb

## 2024-12-21 DIAGNOSIS — E1129 Type 2 diabetes mellitus with other diabetic kidney complication: Secondary | ICD-10-CM | POA: Diagnosis not present

## 2024-12-21 DIAGNOSIS — E1169 Type 2 diabetes mellitus with other specified complication: Secondary | ICD-10-CM | POA: Diagnosis not present

## 2024-12-21 DIAGNOSIS — E1122 Type 2 diabetes mellitus with diabetic chronic kidney disease: Secondary | ICD-10-CM | POA: Diagnosis not present

## 2024-12-21 DIAGNOSIS — Z794 Long term (current) use of insulin: Secondary | ICD-10-CM | POA: Diagnosis not present

## 2024-12-21 DIAGNOSIS — E66813 Obesity, class 3: Secondary | ICD-10-CM | POA: Diagnosis not present

## 2024-12-21 DIAGNOSIS — I130 Hypertensive heart and chronic kidney disease with heart failure and stage 1 through stage 4 chronic kidney disease, or unspecified chronic kidney disease: Secondary | ICD-10-CM

## 2024-12-21 DIAGNOSIS — G4733 Obstructive sleep apnea (adult) (pediatric): Secondary | ICD-10-CM

## 2024-12-21 DIAGNOSIS — Z23 Encounter for immunization: Secondary | ICD-10-CM

## 2024-12-21 DIAGNOSIS — Z6841 Body Mass Index (BMI) 40.0 and over, adult: Secondary | ICD-10-CM

## 2024-12-21 DIAGNOSIS — R21 Rash and other nonspecific skin eruption: Secondary | ICD-10-CM | POA: Insufficient documentation

## 2024-12-21 DIAGNOSIS — R399 Unspecified symptoms and signs involving the genitourinary system: Secondary | ICD-10-CM

## 2024-12-21 DIAGNOSIS — I5032 Chronic diastolic (congestive) heart failure: Secondary | ICD-10-CM | POA: Diagnosis not present

## 2024-12-21 DIAGNOSIS — E1143 Type 2 diabetes mellitus with diabetic autonomic (poly)neuropathy: Secondary | ICD-10-CM

## 2024-12-21 DIAGNOSIS — E875 Hyperkalemia: Secondary | ICD-10-CM

## 2024-12-21 DIAGNOSIS — N184 Chronic kidney disease, stage 4 (severe): Secondary | ICD-10-CM

## 2024-12-21 DIAGNOSIS — E559 Vitamin D deficiency, unspecified: Secondary | ICD-10-CM

## 2024-12-21 DIAGNOSIS — N2581 Secondary hyperparathyroidism of renal origin: Secondary | ICD-10-CM

## 2024-12-21 LAB — URINALYSIS, ROUTINE W REFLEX MICROSCOPIC
Bilirubin, UA: NEGATIVE
Glucose, UA: NEGATIVE
Ketones, UA: NEGATIVE
Nitrite, UA: NEGATIVE
Specific Gravity, UA: 1.02 (ref 1.005–1.030)
Urobilinogen, Ur: 0.2 mg/dL (ref 0.2–1.0)
pH, UA: 7 (ref 5.0–7.5)

## 2024-12-21 LAB — MICROSCOPIC EXAMINATION

## 2024-12-21 LAB — WET PREP FOR TRICH, YEAST, CLUE
Clue Cell Exam: NEGATIVE
Trichomonas Exam: NEGATIVE
Yeast Exam: POSITIVE — AB

## 2024-12-21 LAB — MICROALBUMIN, URINE WAIVED
Creatinine, Urine Waived: 100 mg/dL (ref 10–300)
Microalb, Ur Waived: 150 mg/L — ABNORMAL HIGH (ref 0–19)
Microalb/Creat Ratio: >300 mg/g — ABNORMAL HIGH

## 2024-12-21 MED ORDER — TRIAMCINOLONE ACETONIDE 0.1 % EX CREA: 1.0000 | TOPICAL_CREAM | Freq: Two times a day (BID) | CUTANEOUS | 1 refills | Status: AC

## 2024-12-21 MED ORDER — FLUCONAZOLE 150 MG PO TABS: 150.0000 mg | ORAL_TABLET | Freq: Once | ORAL | 0 refills | Status: AC

## 2024-12-21 NOTE — Patient Instructions (Signed)

## 2024-12-21 NOTE — Assessment & Plan Note (Signed)
Chronic, ongoing, followed by cardiology.  Euvolemic today.  Continue current medication regimen as prescribed by them.  Recommend: - Reminded to call for an overnight weight gain of >2 pounds or a weekly weight gain of >5 pounds - not adding salt to food and read food labels. Reviewed the importance of keeping daily sodium intake to <2000mg daily - Avoid all NSAID products 

## 2024-12-21 NOTE — Assessment & Plan Note (Addendum)
 Chronic, ongoing. Will work on getting new tubing and mask, as currently does not have.

## 2024-12-21 NOTE — Assessment & Plan Note (Signed)
Chronic, ongoing with poor tolerance to statins.  Followed by cardiology.  Continue Praluent and Zetia, as is intolerant to statins.  Lipid panel today.

## 2024-12-21 NOTE — Progress Notes (Signed)
 Contacted via MyChart  My still be infection present in urine, will send for culture and treat if returns positive. You do have some yeast on exam. I will send in medication for this. Any questions?

## 2024-12-21 NOTE — Assessment & Plan Note (Signed)
 Chronic, stable.  Recent notes and labs reviewed.  Continue current medications as prescribed by them.Urine ALB 150 January 2026.  A1c 7.1% January 2026.  Continue collaboration with nephrology and endocrinology. - Cholesterol lowering medication and ARB on board - Eye and foot exam up to date - Vaccinations up to date

## 2024-12-21 NOTE — Progress Notes (Signed)
 "  BP (!) 148/84 (BP Location: Left Arm, Patient Position: Sitting, Cuff Size: Normal)   Pulse 83   Temp 97.9 F (36.6 C) (Oral)   Resp 18   Ht 5' 4.8 (1.646 m)   Wt 272 lb 3.2 oz (123.5 kg)   SpO2 99%   BMI 45.57 kg/m    Subjective:    Patient ID: [name] [name], female    DOB: 1981/03/22, 44 y.o.   MRN: [id number]  HPI: [name] [name] is a 44 y.o. female  Chief Complaint  Patient presents with   Follow-up    Here for follow up   Rash    Questions about a rash that she had   Was treated for UTI in December and feels symptoms are returning.  DIABETES WITH GASTROPARESIS Recent A1c January was 7.1%.  Last visit with endocrinology was on 10/19/24 and returns in February.  Continues Tresiba 112 units, sliding scale Novolog  (does not need to take often), and Mounjaro 7.5 MG weekly. Continues on Vitamin D  supplement due to history of low levels. Hypoglycemic episodes:no Polydipsia/polyuria: no Visual disturbance: no Chest pain: no Paresthesias: no Glucose Monitoring: yes  Accucheck frequency: BID - varies using Dexcom, this morning 176  Fasting glucose:   Post prandial:  Evening: varies  Before meals: Taking Insulin ?: yes  Long acting insulin : 112 Tresiba  Short acting insulin : sliding scale Blood Pressure Monitoring: daily Retinal Examination: Up to Date -- [facility] Foot Exam: Up to Date Pneumovax: Up to Date Influenza: Up to Date Aspirin : yes   HYPERTENSION / HYPERLIPIDEMIA/HF/AORTIC ATHEROSCLEROSIS Last saw cardiology 03/04/24. Emily Coleridge for history of elevations. Continues Praluent .  Is not using CPAP, as needs tubing and mask. Machine is working. Echo in July 2023 showed EF 60-65% to have repeat this year.  Continues to take Carvedilol , Plavix , Zetia , Lasix , Imdur . Has not taken medications this morning. Satisfied with current treatment? yes Duration of hypertension: chronic BP monitoring frequency: daily BP range: 120-130/80 range, but varies to too  high or too low BP medication side effects: no Duration of hyperlipidemia: chronic Cholesterol supplements: none Medication compliance: good compliance Aspirin : yes Recent stressors: no Recurrent headaches: no Visual changes: no Palpitations: no Dyspnea: no Chest pain: no Lower extremity edema: occasional Dizzy/lightheaded: no  RASH To back and sides + arms. Started Thursday last week. Has spread since then to both sides. Does not feel this is shingles, as has had this before.  Duration:  days  Location: as above  Itching: no Burning: yes Redness: yes Oozing: no Scaling: no Blisters: did noticed some drainage to back of neck Painful: yes to neck area  Fevers: 100 about 4-5 days ago Change in detergents/soaps/personal care products: no Recent illness: yes Recent travel:no History of same: no Context: stable Alleviating factors: nothing Treatments attempted: none Shortness of breath: no  Throat/tongue swelling: no Myalgias/arthralgias: no   CHRONIC KIDNEY DISEASE Last visit with nephrology on 12/15/24 with CRT 2.24, eGFR 27, PTH 31, K+ 5.  CKD status: stable Medications renally dose: yes Previous renal evaluation: yes Pneumovax:  Up to Date Influenza Vaccine:  Up to Date      12/21/2024    8:40 AM 06/18/2024    8:46 AM 05/11/2024    1:21 PM 05/07/2024    3:41 PM 12/19/2023    8:31 AM  Depression screen PHQ 2/9  Decreased Interest 0 0 0 0 0  Down, Depressed, Hopeless 0 0 0 0 0  PHQ - 2 Score 0 0 0 0 0  Altered sleeping 2 2 2 2 2   Tired, decreased energy 2 2 2 2 2   Change in appetite 2 2 2 2  0  Feeling bad or failure about yourself  0 0 0 0 0  Trouble concentrating 0 0 0 0 0  Moving slowly or fidgety/restless 0 0 0 0 0  Suicidal thoughts 0 0 0 0 0  PHQ-9 Score 6 6  6  6  4    Difficult doing work/chores Not difficult at all Somewhat difficult Somewhat difficult Somewhat difficult Somewhat difficult     Data saved with a previous flowsheet row definition        12/21/2024    8:40 AM 06/18/2024    8:47 AM 05/07/2024    3:42 PM 12/19/2023    8:32 AM  GAD 7 : Generalized Anxiety Score  Nervous, Anxious, on Edge 0 0 0 0  Control/stop worrying 0 0 0 0  Worry too much - different things 0 0 0 0  Trouble relaxing 0 0 0 0  Restless 2 2 2 2   Easily annoyed or irritable 0 0 0 0  Afraid - awful might happen 0 0 0 0  Total GAD 7 Score 2 2 2 2   Anxiety Difficulty Not difficult at all Somewhat difficult Not difficult at all Somewhat difficult     Relevant past medical, surgical, family and social history reviewed and updated as indicated. Interim medical history since our last visit reviewed. Allergies and medications reviewed and updated.  Review of Systems  Constitutional:  Negative for activity change, appetite change, diaphoresis, fatigue and fever.  Respiratory:  Negative for cough, chest tightness, shortness of breath and wheezing.   Cardiovascular:  Negative for chest pain, palpitations and leg swelling.  Gastrointestinal: Negative.   Endocrine: Negative for cold intolerance, heat intolerance, polydipsia, polyphagia and polyuria.  Neurological: Negative.   Psychiatric/Behavioral: Negative.      Per HPI unless specifically indicated above     Objective:    BP (!) 148/84 (BP Location: Left Arm, Patient Position: Sitting, Cuff Size: Normal)   Pulse 83   Temp 97.9 F (36.6 C) (Oral)   Resp 18   Ht 5' 4.8 (1.646 m)   Wt 272 lb 3.2 oz (123.5 kg)   SpO2 99%   BMI 45.57 kg/m   Wt Readings from Last 3 Encounters:  12/21/24 272 lb 3.2 oz (123.5 kg)  06/18/24 263 lb 3.2 oz (119.4 kg)  05/13/24 270 lb (122.5 kg)    Physical Exam Vitals and nursing note reviewed.  Constitutional:      General: She is awake. She is not in acute distress.    Appearance: She is well-developed and well-groomed. She is obese. She is not ill-appearing or toxic-appearing.  HENT:     Head: Normocephalic.     Right Ear: Hearing and external ear normal.     Left  Ear: Hearing and external ear normal.  Eyes:     General: Lids are normal.        Right eye: No discharge.        Left eye: No discharge.     Conjunctiva/sclera: Conjunctivae normal.     Pupils: Pupils are equal, round, and reactive to light.  Neck:     Thyroid : No thyromegaly.     Vascular: No carotid bruit.  Cardiovascular:     Rate and Rhythm: Normal rate and regular rhythm.     Heart sounds: Normal heart sounds. No murmur heard.    No gallop.  Pulmonary:     Effort: Pulmonary effort is normal. No accessory muscle usage or respiratory distress.     Breath sounds: Normal breath sounds. No decreased breath sounds, wheezing or rales.  Abdominal:     General: Bowel sounds are normal. There is no distension.     Palpations: Abdomen is soft.     Tenderness: There is no abdominal tenderness. There is no right CVA tenderness or left CVA tenderness.  Musculoskeletal:     Cervical back: Normal range of motion and neck supple.     Right lower leg: No edema.     Left lower leg: No edema.  Lymphadenopathy:     Cervical: No cervical adenopathy.  Skin:    General: Skin is warm and dry.     Findings: Rash present. Rash is papular and scaling.     Comments: Scattered patches of papular rash with scaling to some areas. To both sides abdomen. Back of neck no longer present.  Neurological:     Mental Status: She is alert and oriented to person, place, and time.     Deep Tendon Reflexes: Reflexes are normal and symmetric.     Reflex Scores:      Brachioradialis reflexes are 2+ on the right side and 2+ on the left side.      Patellar reflexes are 2+ on the right side and 2+ on the left side. Psychiatric:        Attention and Perception: Attention normal.        Mood and Affect: Mood normal.        Speech: Speech normal.        Behavior: Behavior normal. Behavior is cooperative.        Thought Content: Thought content normal.    Diabetic Foot Exam - Simple   Simple Foot Form Visual  Inspection No deformities, no ulcerations, no other skin breakdown bilaterally: Yes Sensation Testing Intact to touch and monofilament testing bilaterally: Yes Pulse Check Posterior Tibialis and Dorsalis pulse intact bilaterally: Yes Comments     Results for orders placed or performed in visit on 12/21/24  Hemoglobin A1C w/out eAG   Collection Time: 12/15/24 12:00 AM  Result Value Ref Range   Hemoglobin-A1c 7.1%   WET PREP FOR TRICH, YEAST, CLUE   Collection Time: 12/21/24  9:21 AM   Specimen: Urine   Urine  Result Value Ref Range   Trichomonas Exam Negative Negative   Yeast Exam Positive (A) Negative   Clue Cell Exam Negative Negative  Microscopic Examination   Collection Time: 12/21/24  9:21 AM   Urine  Result Value Ref Range   WBC, UA 6-10 (A) 0 - 5 /hpf   RBC, Urine 0-2 0 - 2 /hpf   Epithelial Cells (non renal) 0-10 0 - 10 /hpf   Bacteria, UA Many (A) None seen/Few  Microalbumin, Urine Waived   Collection Time: 12/21/24  9:21 AM  Result Value Ref Range   Microalb, Ur Waived 150 (H) 0 - 19 mg/L   Creatinine, Urine Waived 100 10 - 300 mg/dL   Microalb/Creat Ratio >300 (H) <30 mg/g  Urinalysis, Routine w reflex microscopic   Collection Time: 12/21/24  9:21 AM  Result Value Ref Range   Specific Gravity, UA 1.020 1.005 - 1.030   pH, UA 7.0 5.0 - 7.5   Color, UA Yellow Yellow   Appearance Ur Turbid (A) Clear   Leukocytes,UA 2+ (A) Negative   Protein,UA 3+ (A) Negative/Trace   Glucose, UA Negative  Negative   Ketones, UA Negative Negative   RBC, UA 1+ (A) Negative   Bilirubin, UA Negative Negative   Urobilinogen, Ur 0.2 0.2 - 1.0 mg/dL   Nitrite, UA Negative Negative   Microscopic Examination See below:    *Note: Due to a large number of results and/or encounters for the requested time period, some results have not been displayed. A complete set of results can be found in Results Review.      Assessment & Plan:   Problem List Items Addressed This Visit        Cardiovascular and Mediastinum   Chronic diastolic heart failure (HCC) (Chronic)   Chronic, ongoing, followed by cardiology.  Euvolemic today.  Continue current medication regimen as prescribed by them.  Recommend: - Reminded to call for an overnight weight gain of >2 pounds or a weekly weight gain of >5 pounds - not adding salt to food and read food labels. 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  BP slightly elevated today, but has not taken BP medications this morning.  Recommend she monitor BP at least a few mornings a week at home and document.  DASH diet at home.  Continue current medication regimen and adjust as needed -- continue collaboration with cardiology.  Labs today: TSH and BMP.  Urine ALB 150 January 2026.       Relevant Orders   TSH     Respiratory   Obstructive sleep apnea   Chronic, ongoing. Will work on getting new tubing and mask, as currently does not have.        Digestive   Diabetic gastroparesis associated with type 2 diabetes mellitus (HCC)   Chronic, stable.  Recent notes and labs reviewed.  Continue current medications as prescribed by them.Urine ALB 150 January 2026.  A1c 7.1% January 2026.  Continue collaboration with nephrology and endocrinology. - Cholesterol lowering medication and ARB on board - Eye and foot exam up to date - Vaccinations up to date       Relevant Medications   MOUNJARO 7.5 MG/0.5ML Pen     Endocrine   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)   Chronic, ongoing, followed by nephrology.  Recent notes and labs reviewed.  Continue current medications as prescribed by them.Urine ALB 150 January 2026.  A1c  7.1% January 2026.  Continue collaboration with nephrology and endocrinology. - Cholesterol lowering medication  and ARB on board - Eye and foot exam up to date - Vaccinations up to date      Relevant Medications   MOUNJARO 7.5 MG/0.5ML Pen   Hyperlipidemia associated with type 2 diabetes mellitus (HCC)   Chronic, ongoing with poor tolerance to statins.  Followed by cardiology.  Continue Praluent  and Zetia , as is intolerant to statins.  Lipid panel today.      Relevant Medications   MOUNJARO 7.5 MG/0.5ML Pen   Other Relevant Orders   Lipid Panel w/o Chol/HDL Ratio   CKD stage 4 due to type 2 diabetes mellitus (HCC) - Primary   Chronic, ongoing, followed by nephrology.  Recent notes and labs reviewed.  Continue current medications as prescribed by them.  Urine ALB 150 January 2026.  A1c 7.1% January 2025, trend down.  Continue collaboration with nephrology and endocrinology.  UA  today noting + BLD, PRT, and LEUKS.  Will send for culture and determine need for abx therapy after this returns. - Cholesterol lowering medication and ARB on board - Eye and foot exam up to date - Vaccinations up to date      Relevant Medications   MOUNJARO 7.5 MG/0.5ML Pen   Other Relevant Orders   Microalbumin, Urine Waived (Completed)   Basic metabolic panel with GFR     Musculoskeletal and Integument   Rash   It has presented to multiple areas, do not suspect this is shingles and does not present like measles. ?hand, foot, mouth. Her daughter does work in higher risk environment for this. Recommend applying Triamcinolone  cream to areas and monitor closely. If worsening or ongoing return to office and may need dermatology referral. Discussed plan with patient at length.        Other   Obesity   BMI 45.57 with T2DM, HF, CKD.  Recommended eating smaller high protein, low fat meals more frequently and exercising 30 mins a day 5 times a week with a goal of 10-15lb weight loss in the next 3 months. Patient voiced their understanding and motivation to adhere to these recommendations.       Relevant Medications    MOUNJARO 7.5 MG/0.5ML Pen   Other Relevant Orders   VITAMIN D  25 Hydroxy (Vit-D Deficiency, Fractures)   Long term (current) use of insulin  (HCC)   Ongoing, continue collaboration with endocrinology. Refer to diabetic CKD plan of care.      Other Visit Diagnoses       Urinary symptom or sign       UA with some positive findings, will send for culture and treat as needed. Wet prep with yeast, sent Diflucan .   Relevant Orders   Urinalysis, Routine w reflex microscopic (Completed)   Urine Culture   WET PREP FOR TRICH, YEAST, CLUE (Completed)     Flu vaccine need       Flu vaccine today, discused with patient.   Relevant Orders   Flu vaccine trivalent PF, 6mos and older(Flulaval,Afluria,Fluarix,Fluzone) (Completed)        Follow up plan: Return in about 6 months (around 06/20/2025) for T2DM, HTN/HLD, CKD.      "

## 2024-12-21 NOTE — Assessment & Plan Note (Signed)
 BMI 45.57 with T2DM, HF, CKD.  Recommended eating smaller high protein, low fat meals more frequently and exercising 30 mins a day 5 times a week with a goal of 10-15lb weight loss in the next 3 months. Patient voiced their understanding and motivation to adhere to these recommendations.

## 2024-12-21 NOTE — Assessment & Plan Note (Signed)
 It has presented to multiple areas, do not suspect this is shingles and does not present like measles. ?hand, foot, mouth. Her daughter does work in higher risk environment for this. Recommend applying Triamcinolone  cream to areas and monitor closely. If worsening or ongoing return to office and may need dermatology referral. Discussed plan with patient at length.

## 2024-12-21 NOTE — Assessment & Plan Note (Signed)
 Ongoing, continue collaboration with endocrinology. Refer to diabetic CKD plan of care.

## 2024-12-21 NOTE — Assessment & Plan Note (Signed)
 Chronic, ongoing, followed by nephrology.  Recent notes and labs reviewed.  Continue current medications as prescribed by them.  Urine ALB 150 January 2026.  A1c 7.1% January 2025, trend down.  Continue collaboration with nephrology and endocrinology.  UA today noting + BLD, PRT, and LEUKS.  Will send for culture and determine need for abx therapy after this returns. - Cholesterol lowering medication and ARB on board - Eye and foot exam up to date - Vaccinations up to date

## 2024-12-21 NOTE — Assessment & Plan Note (Signed)
 Chronic, stable.  BP slightly elevated today, but has not taken BP medications this morning.  Recommend she monitor BP at least a few mornings a week at home and document.  DASH diet at home.  Continue current medication regimen and adjust as needed -- continue collaboration with cardiology.  Labs today: TSH and BMP.  Urine ALB 150 January 2026.

## 2024-12-21 NOTE — Assessment & Plan Note (Signed)
Chronic, ongoing.  Continue supplement daily and check level today.

## 2024-12-21 NOTE — Assessment & Plan Note (Signed)
 Chronic, ongoing, followed by nephrology.  Recent notes and labs reviewed.  Continue current medications as prescribed by them.Urine ALB 150 January 2026.  A1c  7.1% January 2026.  Continue collaboration with nephrology and endocrinology. - Cholesterol lowering medication and ARB on board - Eye and foot exam up to date - Vaccinations up to date

## 2024-12-22 LAB — TSH: TSH: 2.72 u[IU]/mL (ref 0.450–4.500)

## 2024-12-22 LAB — LIPID PANEL W/O CHOL/HDL RATIO
Cholesterol, Total: 166 mg/dL (ref 100–199)
HDL: 36 mg/dL — ABNORMAL LOW
LDL Chol Calc (NIH): 110 mg/dL — ABNORMAL HIGH (ref 0–99)
Triglycerides: 109 mg/dL (ref 0–149)
VLDL Cholesterol Cal: 20 mg/dL (ref 5–40)

## 2024-12-22 LAB — BASIC METABOLIC PANEL WITH GFR
BUN/Creatinine Ratio: 17 (ref 9–23)
BUN: 32 mg/dL — ABNORMAL HIGH (ref 6–24)
CO2: 18 mmol/L — ABNORMAL LOW (ref 20–29)
Calcium: 8.1 mg/dL — ABNORMAL LOW (ref 8.7–10.2)
Chloride: 106 mmol/L (ref 96–106)
Creatinine, Ser: 1.84 mg/dL — ABNORMAL HIGH (ref 0.57–1.00)
Glucose: 181 mg/dL — ABNORMAL HIGH (ref 70–99)
Potassium: 5.7 mmol/L — ABNORMAL HIGH (ref 3.5–5.2)
Sodium: 134 mmol/L (ref 134–144)
eGFR: 34 mL/min/1.73 — ABNORMAL LOW

## 2024-12-22 LAB — MAGNESIUM: Magnesium: 1.8 mg/dL (ref 1.6–2.3)

## 2024-12-22 LAB — VITAMIN D 25 HYDROXY (VIT D DEFICIENCY, FRACTURES): Vit D, 25-Hydroxy: 86.1 ng/mL (ref 30.0–100.0)

## 2024-12-22 NOTE — Progress Notes (Signed)
 Contacted via MyChart -- need lab only visit in 2 weeks and alert her to check MyChart message from me. Thank you.  Good morning [name] [name], your labs have returned: - LDL on lipid panel has trended up, are you taking your Praluent  and Zetia  consistently? - Kidney function remains at baseline. Potassium is elevated though. Are you taking your Lokelma as ordered? I know in past you were not always taking consistently. Please ensure you do take as ordered. I recommend we recheck this outpatient in a couple weeks to see if it trends down. Also lower potassium rich foods in your diet. - Remainder of labs stable. Any questions? Keep being amazing!!  Thank you for allowing me to participate in your care.  I appreciate you. Kindest regards, Jurgen Groeneveld

## 2024-12-24 LAB — URINE CULTURE

## 2024-12-24 MED ORDER — CIPROFLOXACIN HCL 500 MG PO TABS: 500.0000 mg | ORAL_TABLET | Freq: Every day | ORAL | 0 refills | Status: AC

## 2024-12-24 NOTE — Progress Notes (Signed)
 Contacted via MyChart  Estimated Creatinine Clearance: 51.3 mL/min (A) (by C-G formula based on SCr of 1.84 mg/dL (H)).   Good evening [name] [name], your urine is showing presence of infection. On review of susceptible treatments, some we cannot do due to your kidney function and some are IV treatments. I will send in Cipro  which based on current function would be okay to take daily for 3 days. Any questions? Do not take any of your muscle relaxer, Tizanidine  while taking this as they can interact.

## 2024-12-24 NOTE — Progress Notes (Signed)
 Called patient and left a message to call back to get scheduled.

## 2024-12-28 ENCOUNTER — Other Ambulatory Visit: Payer: Self-pay | Admitting: Nurse Practitioner

## 2024-12-28 NOTE — Telephone Encounter (Signed)
 Requested Prescriptions  Pending Prescriptions Disp Refills   famotidine  (PEPCID ) 40 MG tablet [Pharmacy Med Name: Famotidine  40 MG Oral Tablet] 90 tablet 1    Sig: Take 1 tablet by mouth once daily     Gastroenterology:  H2 Antagonists Passed - 12/28/2024  2:47 PM      Passed - Valid encounter within last 12 months    Recent Outpatient Visits           1 week ago CKD stage 4 due to type 2 diabetes mellitus (HCC)   [facility] [place], [place] T, NP   6 months ago Diabetic gastroparesis associated with type 2 diabetes mellitus (HCC)   [place] [facility] [place], Melanie T, NP   7 months ago Sebaceous cyst of right axilla   [place] [place], Melanie DASEN, NP

## 2025-01-05 ENCOUNTER — Ambulatory Visit: Admitting: Nurse Practitioner

## 2025-01-11 ENCOUNTER — Encounter: Payer: Self-pay | Admitting: Internal Medicine

## 2025-01-12 ENCOUNTER — Ambulatory Visit: Payer: Medicare (Managed Care) | Admitting: Nurse Practitioner

## 2025-01-12 ENCOUNTER — Encounter: Payer: Self-pay | Admitting: Nurse Practitioner

## 2025-01-12 VITALS — BP 160/80 | HR 92 | Ht 65.0 in | Wt 260.0 lb

## 2025-01-12 DIAGNOSIS — E1165 Type 2 diabetes mellitus with hyperglycemia: Secondary | ICD-10-CM

## 2025-01-12 DIAGNOSIS — I25119 Atherosclerotic heart disease of native coronary artery with unspecified angina pectoris: Secondary | ICD-10-CM | POA: Diagnosis not present

## 2025-01-12 DIAGNOSIS — I5020 Unspecified systolic (congestive) heart failure: Secondary | ICD-10-CM

## 2025-01-12 DIAGNOSIS — Z794 Long term (current) use of insulin: Secondary | ICD-10-CM | POA: Diagnosis not present

## 2025-01-12 DIAGNOSIS — I130 Hypertensive heart and chronic kidney disease with heart failure and stage 1 through stage 4 chronic kidney disease, or unspecified chronic kidney disease: Secondary | ICD-10-CM

## 2025-01-12 DIAGNOSIS — E785 Hyperlipidemia, unspecified: Secondary | ICD-10-CM | POA: Diagnosis not present

## 2025-01-12 DIAGNOSIS — N183 Chronic kidney disease, stage 3 unspecified: Secondary | ICD-10-CM | POA: Diagnosis not present

## 2025-01-12 DIAGNOSIS — I255 Ischemic cardiomyopathy: Secondary | ICD-10-CM

## 2025-01-12 MED ORDER — CARVEDILOL 12.5 MG PO TABS: 18.7500 mg | ORAL_TABLET | Freq: Two times a day (BID) | ORAL | 3 refills | Status: AC

## 2025-01-12 MED ORDER — RANOLAZINE ER 500 MG PO TB12: 500.0000 mg | ORAL_TABLET | Freq: Two times a day (BID) | ORAL | 1 refills | Status: AC

## 2025-01-12 NOTE — Patient Instructions (Signed)
 Medication Instructions:  Your physician recommends the following medication changes.  START TAKING: Ranexa  500 mg twice daily  INCREASE: Carvedilol  to 18.75 mg twice daily (one and a half tablet)  *If you need a refill on your cardiac medications before your next appointment, please call your pharmacy*  Lab Work: None ordered If you have labs (blood work) drawn today and your tests are completely normal, you will receive your results only by: MyChart Message (if you have MyChart) OR A paper copy in the mail If you have any lab test that is abnormal or we need to change your treatment, we will call you to review the results.  Testing/Procedures: None ordered  Follow-Up: At [facility], you and your health needs are our priority.  As part of our continuing mission to provide you with exceptional heart care, our providers are all part of one team.  This team includes your primary Cardiologist (physician) and Advanced Practice Providers or APPs (Physician Assistants and Nurse Practitioners) who all work together to provide you with the care you need, when you need it.  Your next appointment:   2 week(s)  Provider:   Deatrice Cage, MD    We recommend signing up for the patient portal called MyChart.  Sign up information is provided on this After Visit Summary.  MyChart is used to connect with patients for Virtual Visits (Telemedicine).  Patients are able to view lab/test results, encounter notes, upcoming appointments, etc.  Non-urgent messages can be sent to your provider as well.   To learn more about what you can do with MyChart, go to [email or web address].

## 2025-01-12 NOTE — Progress Notes (Signed)
 "    Office Visit    Patient Name: [name] [name] Date of Encounter: 01/12/2025  Primary Care Provider:  Valerio Melanie DASEN, NP Primary Cardiologist:  Deatrice Cage, MD    Chief Complaint    44 y.o. female with PMH of CAD s/p multiple interventions, ischemic CM, HF improved EF, HTN, HLD w/statin intolerance, diabetes, CKD stage 3-4 who presents today for overdue 6 month follow up and to discuss chest pain.   Past Medical History   Subjective   Past Medical History:  Diagnosis Date   CAD (coronary artery disease)    a. 03/2015 NSTEMI/PCI: LCX 182m (DES), OM1 100p (DES - vessel labeled Ramus in subsequent cath report); b. 04/2015 Cath: patent stents/stable anatomy; c. 02/2016 MV: No isch; d. 11/2018 PCI: LCX 95p/m (PTCA->20%); e. 01/2019 PCI: RCA 85p/m (2.5x32 Synergy DES); f. 01/2021 Cath: LM 30, LAD 40ost/p, 38m/d, LCX 55p restenosis, OM1 10, OM2 40, RCA patent stent-->Med rx.   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 03/2015 Echo: EF 30-35%, mild concentric LVH, severe HK of inf, inflat, & lat walls, mod MR, mild TR;  b. 04/2015 LV Gram: EF 55-65%; c. 09/2017 Echo: EF 55-60%, no rwma. Mild MR; d. 11/2018 Echo: Ef 60-65%, no rwma; e. 06/2022 Echo: EF 60-65%, nl RV fxn, mildly dil LA, mild MR.   CKD (chronic kidney disease), stage IV (HCC)    Diabetes type 2, controlled (HCC)    a. since 2002    Diabetic gastroparesis (HCC)    a. chronic nausea/vomiting.   Diabetic retinopathy (HCC)    Diverticulosis, sigmoid 07/2016   Fatty liver    GERD (gastroesophageal reflux disease)    Heavy menses    a. H/O IUD - expired in 2014 - remains in place.   Hx of migraines    Hyperkalemia    a. On chronic lokelma - managed by nephrology.   Hyperlipidemia    Intolerant of atorvastatin , rosuvastatin    Hypertension    Iron  deficiency anemia    Ischemic cardiomyopathy    a. 03/2015 EF 30-35% post NSTEMI;  b. 04/2015 EF 55-65% on LV gram; c.  09/2017 Echo: EF 55-60%; d. 11/2018 Echo: Ef 60-65%; e. 06/2022 Echo: EF 60-65%.   Obesity    Tobacco abuse    a. quit 03/2015.   Uterine fibroid    Vertigo    Vitamin D  deficiency    Past Surgical History:  Procedure Laterality Date   APPENDECTOMY     CARDIAC CATHETERIZATION  03/2015   x2 stent [facility]   CARDIAC CATHETERIZATION N/A 05/05/2015   Procedure: Left Heart Cath and Coronary Angiography;  Surgeon: Peter M Jordan, MD;  Location: [facility] INVASIVE CV LAB;  Service: Cardiovascular;  Laterality: N/A;   CATARACT EXTRACTION Left 09/12/2021   CATARACT EXTRACTION Right 10/10/2021   COLONOSCOPY WITH PROPOFOL  N/A 07/31/2016   Procedure: COLONOSCOPY WITH PROPOFOL ;  Surgeon: Gladis RAYMOND Mariner, MD;  Location: [facility] ENDOSCOPY;  Service: Endoscopy;  Laterality: N/A;   CORONARY BALLOON ANGIOPLASTY N/A 12/04/2018   Procedure: CORONARY BALLOON ANGIOPLASTY;  Surgeon: Cage Deatrice LABOR, MD;  Location: [facility] INVASIVE CV LAB;  Service: Cardiovascular;  Laterality: N/A;   CORONARY PRESSURE/FFR STUDY N/A 12/04/2018   Procedure: INTRAVASCULAR PRESSURE WIRE/FFR STUDY;  Surgeon: Cage Deatrice LABOR, MD;  Location: [facility] INVASIVE CV LAB;  Service: Cardiovascular;  Laterality: N/A;   CORONARY STENT INTERVENTION N/A 01/12/2019   Procedure: CORONARY STENT INTERVENTION;  Surgeon: Mady Bruckner,  MD;  Location: [facility] INVASIVE CV LAB;  Service: Cardiovascular;  Laterality: N/A;   ESOPHAGOGASTRODUODENOSCOPY (EGD) WITH PROPOFOL  N/A 07/31/2016   Procedure: ESOPHAGOGASTRODUODENOSCOPY (EGD) WITH PROPOFOL ;  Surgeon: Gladis RAYMOND Mariner, MD;  Location: [facility] ENDOSCOPY;  Service: Endoscopy;  Laterality: N/A;   EYE SURGERY  07/16/2022   Membrane peeled   LAPAROSCOPIC APPENDECTOMY N/A 08/07/2016   Procedure: APPENDECTOMY LAPAROSCOPIC;  Surgeon: Dorothyann LITTIE Husk, MD;  Location: [facility] ORS;  Service: General;  Laterality: N/A;   LEFT HEART CATH AND CORONARY ANGIOGRAPHY Left 12/04/2018   Procedure: LEFT HEART CATH AND CORONARY  ANGIOGRAPHY;  Surgeon: Darron Deatrice LABOR, MD;  Location: [facility] INVASIVE CV LAB;  Service: Cardiovascular;  Laterality: Left;   LEFT HEART CATH AND CORONARY ANGIOGRAPHY N/A 01/12/2019   Procedure: LEFT HEART CATH AND CORONARY ANGIOGRAPHY;  Surgeon: Mady Bruckner, MD;  Location: [facility] INVASIVE CV LAB;  Service: Cardiovascular;  Laterality: N/A;   MOUTH SURGERY     RIGHT/LEFT HEART CATH AND CORONARY ANGIOGRAPHY N/A 01/29/2021   Procedure: RIGHT/LEFT HEART CATH AND CORONARY ANGIOGRAPHY;  Surgeon: Darron Deatrice LABOR, MD;  Location: [facility] INVASIVE CV LAB;  Service: Cardiovascular;  Laterality: N/A;   VITRECTOMY Left 10/30/2021    Allergies  Allergies[1]     History of Present Illness     44 y.o. 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 Cardiac history dates back to April 2016, when she was hospitalized with non-STEMI. She was found to have LV dysfunction with an EF of 30 to 35%. Diagnostic catheterization showed severe left circumflex and OM1 disease. Both areas were successfully treated with drug-eluting stents. Subsequent catheterization in May 2016, showed patency of both stents and normalization of LV function by ventriculography. She had nonischemic stress testing in March 2017, but in the setting of recurrent angina in December 2019, she underwent diagnostic catheterization, which showed patent OM1 stent with focal in-stent restenosis in the left circumflex at the origin of the OM 2, and new significant stenosis in the proximal to mid right coronary artery. Balloon angioplasty was performed in the mid left circumflex and she subsequently underwent staged PCI of the RCA in early 2020.  In February 2022, she underwent right and left heart  cardiac catheterization in the setting of chest pain and dyspnea. This showed patent stents in the RCA, left circumflex, and OM1. There was moderate restenosis in the mid left circumflex at the site of prior PTCA. She was medically managed.  Echocardiogram in July 2023 showed an EF of 60 to 65% with normal RV function, mildly dilated left atrium, and mild mitral regurgitation.       At her last visit 02/2024, noted to have occasional fleeting chest pain with plan to follow clinically. Mild orthostasis prompted decrease in carvedilol  to 12.5mg  AM and 6.25mg  PM, with BP otherwise managed on irbesartan  and imdur . She was euvolemic on exam.   She presents today for follow up noting 2 weeks of elevated BP, chest pain with left arm pain/numbness, dyspnea with minimal exertion, fatigue, lightheadedness and dizziness. She reports pain mostly at rest, lasting 30-40 minutes on average, several times weekly. She had an episode of chest pain while washing dishes that required her to stop and rest. She noted chest pain this morning and also while ECG was performed in clinic, each lasting just a minute or two and  resolving spontaneously. She has taken NTG x3 doses on two occasions with relief, though not immediate. Despite symptoms occurring at rest, with the exception of one episode, she has not had exertional symptoms, despite remaining active in/around her home.  Home BPs have been running 150s-160s/90s at home and she increased her carvedilol  to 12.5mg  twice daily. She is somewhat active in the home and has been working on dietary modifications of leaner proteins and less fried foods. She notes mild bilateral LE edema that is stable and relieved with rest/elevation.   Of note, over the last 2 months she has had: COVID, URI, UTI, shingles and hand, foot, mouth.   ROS: other systems negative, unless noted above  Objective   Home Medications    Current Outpatient Medications  Medication Sig Dispense Refill    acetaminophen  (TYLENOL ) 500 MG tablet Take 1,000 mg by mouth every 4 (four) hours as needed.     Alirocumab  (PRALUENT ) 150 MG/ML SOAJ Inject 1 mL (150 mg total) into the skin every 14 (fourteen) days. 6 mL 3   allopurinol  (ZYLOPRIM ) 100 MG tablet Take 0.5 tablets (50 mg total) by mouth daily. 45 tablet 4   aspirin  EC 81 MG tablet Take 81 mg by mouth daily.     BD PEN NEEDLE MICRO U/F 32G X 6 MM MISC Inject into the skin.     cetirizine  (ZYRTEC  ALLERGY ) 10 MG tablet Take 1 tablet (10 mg total) by mouth daily. 30 tablet 5   Cholecalciferol  (VITAMIN D3) 1.25 MG (50000 UT) CAPS Take 1 capsule (1.25 mg total) by mouth once a week. 12 capsule 3   clopidogrel  (PLAVIX ) 75 MG tablet Take 1 tablet (75 mg total) by mouth daily. 90 tablet 2   Continuous Blood Gluc Sensor (DEXCOM G6 SENSOR) MISC See admin instructions.     EPINEPHrine  0.3 mg/0.3 mL IJ SOAJ injection Inject 0.3 mg into the muscle as needed for anaphylaxis. 2 each 1   etonogestrel  (NEXPLANON ) 68 MG IMPL implant 1 each (68 mg total) by Subdermal route once for 1 dose. 1 each 0   ezetimibe  (ZETIA ) 10 MG tablet Take 1 tablet by mouth once daily 90 tablet 1   famotidine  (PEPCID ) 40 MG tablet Take 1 tablet by mouth once daily 90 tablet 1   furosemide  (LASIX ) 20 MG tablet Take 1 tablet (20 mg total) by mouth daily. 30 tablet 6   insulin  aspart (NOVOLOG ) 100 UNIT/ML injection Inject 10 Units into the skin 3 (three) times daily with meals. 10 mL 11   insulin  degludec (TRESIBA) 200 UNIT/ML FlexTouch Pen Inject 110 Units into the skin daily at 10 pm.     irbesartan  (AVAPRO ) 75 MG tablet Take 1 tablet (75 mg total) by mouth daily. 90 tablet 1   isosorbide  mononitrate (IMDUR ) 120 MG 24 hr tablet Take 1 tablet by mouth once daily 90 tablet 1   LOKELMA 10 g PACK packet Take 10 g by mouth daily.     MOUNJARO 7.5 MG/0.5ML Pen Inject 7.5 mg into the skin once a week.     nitroGLYCERIN  (NITROSTAT ) 0.4 MG SL tablet Take 1 tab under your tongue while sitting for  chest pain, If no relief may repeat, one tab every 5 min up to 3 tabs total over 15 mins. 25 tablet 3   Olopatadine  HCl 0.2 % SOLN Apply 1 drop to eye daily as needed (itchy watery eyes). 2.5 mL 5   pantoprazole  (PROTONIX ) 40 MG tablet Take 1 tablet by mouth twice daily  180 tablet 0   promethazine  (PHENERGAN ) 25 MG tablet Take 25 mg by mouth every 6 (six) hours as needed for nausea or vomiting.     ranolazine  (RANEXA ) 500 MG 12 hr tablet Take 1 tablet (500 mg total) by mouth 2 (two) times daily. 180 tablet 1   tiZANidine  (ZANAFLEX ) 2 MG tablet Take 1 tablet (2 mg total) by mouth every 8 (eight) hours as needed for muscle spasms. 90 tablet 0   triamcinolone  cream (KENALOG ) 0.1 % Apply 1 Application topically 2 (two) times daily. 45 g 1   XHANCE  93 MCG/ACT EXHU Place 1 spray into both nostrils 2 (two) times daily.     azelastine  (ASTELIN ) 0.1 % nasal spray Place 1 spray into both nostrils 2 (two) times daily as needed. Use in each nostril as directed (Patient not taking: Reported on 01/12/2025) 30 mL 5   carvedilol  (COREG ) 12.5 MG tablet Take 1.5 tablets (18.75 mg total) by mouth 2 (two) times daily with a meal. 90 tablet 3   mupirocin  ointment (BACTROBAN ) 2 % Apply 1 Application topically 2 (two) times daily. Under axilla area. (Patient not taking: Reported on 01/12/2025) 22 g 0   No current facility-administered medications for this visit.   Facility-Administered Medications Ordered in Other Visits  Medication Dose Route Frequency Provider Last Rate Last Admin   sodium chloride  flush (NS) 0.9 % injection 3 mL  3 mL Intravenous Q12H Orie, Jacquelyn D, PA-C         Physical Exam    VS:  BP (!) 160/80   Pulse 92   Ht 5' 5 (1.651 m)   Wt 260 lb (117.9 kg)   BMI 43.27 kg/m  , BMI Body mass index is 43.27 kg/m.        Vitals:   01/12/25 1519 01/12/25 1716  BP: (!) 156/100 (!) 160/80  Pulse:        GEN: Well nourished, well developed, in no acute distress. HEENT: normal. Neck: Supple,  no JVD, carotid bruits, or masses. Cardiac: RRR, no murmurs, rubs, or gallops. No clubbing, cyanosis, edema.  Radials 2+/PT 2+ and equal bilaterally.  Respiratory:  Respirations regular and unlabored, clear to auscultation bilaterally. GI: Soft, nontender, nondistended, BS + x 4. MS: no deformity or atrophy. Skin: warm and dry, no rash. Neuro:  Strength and sensation are intact. Psych: Normal affect.  Accessory Clinical Findings    ECG personally reviewed by me today - EKG Interpretation Date/Time:  Wednesday January 12 2025 15:17:12 EST Ventricular Rate:  92 PR Interval:  148 QRS Duration:  82 QT Interval:  378 QTC Calculation: 467 R Axis:   -19  Text Interpretation: Normal sinus rhythm leftward axis Confirmed by Vivienne Bruckner [phone]) on 01/12/2025 3:47:57 PM  - no acute changes.  Lab Results  Component Value Date   WBC 6.4 09/05/2023   HGB 10.6 (L) 09/05/2023   HCT 34.5 (L) 09/05/2023   MCV 85.0 09/05/2023   PLT 404 (H) 09/05/2023   Lab Results  Component Value Date   CREATININE 1.84 (H) 12/21/2024   BUN 32 (H) 12/21/2024   NA 134 12/21/2024   K 5.7 (H) 12/21/2024   CL 106 12/21/2024   CO2 18 (L) 12/21/2024   Lab Results  Component Value Date   ALT 12 12/19/2023   AST 10 12/19/2023   GGT 487 (H) 06/03/2016   ALKPHOS 116 12/19/2023   BILITOT <0.2 12/19/2023   Lab Results  Component Value Date   CHOL 166 12/21/2024  HDL 36 (L) 12/21/2024   LDLCALC 110 (H) 12/21/2024   LDLDIRECT 163 (H) 12/17/2018   TRIG 109 12/21/2024   CHOLHDL 4.2 10/22/2021    Lab Results  Component Value Date   HGBA1C 7.1% 12/15/2024   Lab Results  Component Value Date   TSH 2.720 12/21/2024       Assessment & Plan    1.  Multivessel CAD: Cath 03/2015 with stenting to LCx and ramus intermediate, 11/2018 balloon angioplasty to mid left Cx, 01/2019 DES to mid-RCA, and last cath 01/2021 with moderate in-stent restenosis of mid-left Cx, patent RCA DES and medical therapy advised.  ECG today: SR @ 92 with no acute ST/T wave changes. She reports a two week history of chest pain at rest that is partially responsive to nitroglycerin  with associated symptoms of dyspnea, fatigue, lightheadedness, dizziness and hypertension. Because of hypertension, she increased carvedilol  to 12.5mg  BID. She is compliant with GDMT: ASA 81mg  daily, clopidogrel  75mg  daily, carvedilol  12.5mg  BID, no statin d/t intolerance and instead on Praluent  150mg /ml q2weeks and zetia  10mg  daily. # Discussed options of medical therapy with addition of Ranexa  and increasing carvedilol  dose and/or heart catheterization  # Given CKD and somewhat atypical symptoms, we opted for a trial of medical therapy with close follow up # Start Ranexa  500mg  BID - QT 378, w/ creat Cl in the 30's will f/u ECG at office visit in 2 wks. # Increase carvedilol  to 18.75mg  BID for BP/anginal benefit  2. HTN: BP elevated in office 150/96 >> 160/80 and by similar home readings, over the last 2 weeks on isosorbide  mononitrate 120mg , irbesartan  75mg , furosemide  20mg  daily, and carvedilol  12.5mg  BID which she increased in the last 2 weeks.  # In setting of uncontrolled hypertension and chest, will increase carvedilol  to 18.75mg  BID for BP/anginal benefit  3. Heart Failure w/Improved EF: Echocardiogram 06/2022 with LVEF 60-65%, with normal diastolic fx and RV fxn, mildly dilated LA, mild mitral regurg. She is euvolemic on exam today with weights stable over last 6 months. No appreciable edema on exam. Reports medication compliance with GDMT: irbesartan , carvedilol , furosemide , Mounjaro. Labs 12/2024: BUN/Creatinine 32/1.84, K+ 5.7 (see below) # Continue therapy as above  4. HLD, goal LDL <55: Labs 12/2024: TC 166, TGs 109, LDL 110 on Praluent  150mg /mL and zetia  10mg  daily. Admits to missing several doses of PCSK9i prior to lab work with historically controlled LDL at 54 in 12/2023.  # Continue therapy as above. Discussed setting reminders. # Labs per  PCP, July 2026  5. T2DM: Labs 12/2024: A1c 7.1% on Mounjaro, Tresiba, Novolog . Followed closely by endocrinology.   6. CKD stage III-IV / Hyperkalemia: Labs 12/2024 BUN/Creatinine 32/1.84, K+ 5.7.  Plan for f/u lab in coming weeks.  On chronic lokelma. Also on irbesartan  which is likely contributing.  Follows with nephrology.    Disp: 2 weeks with Darron MD, with EKG.  She understands that if c/p symptoms worsen, she may contact us  for sooner appt or present to ED for eval.   Lonni Meager, NP 01/12/2025, 5:16 PM     [1]  Allergies Allergen Reactions   Lisinopril  Anaphylaxis, Itching and Swelling   Other Itching and Swelling    [place] seasoning    Rosemary Oil Anaphylaxis   Shellfish Allergy  Itching and Swelling    Pt states that she HAS had IV Contrast without any reaction   Statins Other (See Comments)    Myalgias   Metformin  And Related Diarrhea, Nausea And Vomiting and Rash   "

## 2025-01-26 ENCOUNTER — Ambulatory Visit: Payer: Medicare (Managed Care) | Admitting: Cardiovascular Disease

## 2025-05-17 ENCOUNTER — Ambulatory Visit

## 2025-06-20 ENCOUNTER — Ambulatory Visit: Admitting: Nurse Practitioner
